# Patient Record
Sex: Male | Born: 1955 | Race: White | Hispanic: No | Marital: Single | Smoking: Former smoker
Health system: Southern US, Community
[De-identification: ages and names within clinical notes are randomized; demographics above are authoritative.]

## PROBLEM LIST (undated history)

## (undated) DIAGNOSIS — K760 Fatty (change of) liver, not elsewhere classified: Secondary | ICD-10-CM

## (undated) DIAGNOSIS — R519 Headache, unspecified: Secondary | ICD-10-CM

## (undated) DIAGNOSIS — I499 Cardiac arrhythmia, unspecified: Secondary | ICD-10-CM

## (undated) DIAGNOSIS — K219 Gastro-esophageal reflux disease without esophagitis: Secondary | ICD-10-CM

## (undated) DIAGNOSIS — J449 Chronic obstructive pulmonary disease, unspecified: Secondary | ICD-10-CM

## (undated) DIAGNOSIS — K429 Umbilical hernia without obstruction or gangrene: Secondary | ICD-10-CM

## (undated) DIAGNOSIS — E785 Hyperlipidemia, unspecified: Secondary | ICD-10-CM

## (undated) DIAGNOSIS — F419 Anxiety disorder, unspecified: Secondary | ICD-10-CM

## (undated) DIAGNOSIS — D649 Anemia, unspecified: Secondary | ICD-10-CM

## (undated) DIAGNOSIS — Z87891 Personal history of nicotine dependence: Secondary | ICD-10-CM

## (undated) DIAGNOSIS — R892 Abnormal level of other drugs, medicaments and biological substances in specimens from other organs, systems and tissues: Secondary | ICD-10-CM

## (undated) DIAGNOSIS — J302 Other seasonal allergic rhinitis: Secondary | ICD-10-CM

## (undated) DIAGNOSIS — I209 Angina pectoris, unspecified: Secondary | ICD-10-CM

## (undated) DIAGNOSIS — F1210 Cannabis abuse, uncomplicated: Secondary | ICD-10-CM

## (undated) DIAGNOSIS — R51 Headache: Secondary | ICD-10-CM

## (undated) DIAGNOSIS — I700 Atherosclerosis of aorta: Secondary | ICD-10-CM

## (undated) DIAGNOSIS — Z8679 Personal history of other diseases of the circulatory system: Secondary | ICD-10-CM

## (undated) DIAGNOSIS — M1990 Unspecified osteoarthritis, unspecified site: Secondary | ICD-10-CM

## (undated) DIAGNOSIS — F1290 Cannabis use, unspecified, uncomplicated: Secondary | ICD-10-CM

## (undated) DIAGNOSIS — I420 Dilated cardiomyopathy: Secondary | ICD-10-CM

## (undated) DIAGNOSIS — E1165 Type 2 diabetes mellitus with hyperglycemia: Secondary | ICD-10-CM

## (undated) DIAGNOSIS — Z7901 Long term (current) use of anticoagulants: Secondary | ICD-10-CM

## (undated) DIAGNOSIS — I10 Essential (primary) hypertension: Secondary | ICD-10-CM

## (undated) DIAGNOSIS — I2510 Atherosclerotic heart disease of native coronary artery without angina pectoris: Secondary | ICD-10-CM

## (undated) DIAGNOSIS — IMO0002 Reserved for concepts with insufficient information to code with codable children: Secondary | ICD-10-CM

## (undated) DIAGNOSIS — I5022 Chronic systolic (congestive) heart failure: Secondary | ICD-10-CM

## (undated) DIAGNOSIS — I428 Other cardiomyopathies: Secondary | ICD-10-CM

## (undated) DIAGNOSIS — G4733 Obstructive sleep apnea (adult) (pediatric): Secondary | ICD-10-CM

## (undated) DIAGNOSIS — E669 Obesity, unspecified: Secondary | ICD-10-CM

## (undated) DIAGNOSIS — G43909 Migraine, unspecified, not intractable, without status migrainosus: Secondary | ICD-10-CM

## (undated) DIAGNOSIS — G47419 Narcolepsy without cataplexy: Secondary | ICD-10-CM

## (undated) HISTORY — DX: Obstructive sleep apnea (adult) (pediatric): G47.33

## (undated) HISTORY — DX: Anxiety disorder, unspecified: F41.9

## (undated) HISTORY — DX: Cardiac arrhythmia, unspecified: I49.9

## (undated) HISTORY — DX: Essential (primary) hypertension: I10

## (undated) HISTORY — DX: Abnormal level of other drugs, medicaments and biological substances in specimens from other organs, systems and tissues: R89.2

## (undated) HISTORY — DX: Obesity, unspecified: E66.9

## (undated) HISTORY — DX: Dilated cardiomyopathy: I42.0

## (undated) HISTORY — DX: Type 2 diabetes mellitus with hyperglycemia: E11.65

## (undated) HISTORY — DX: Unspecified osteoarthritis, unspecified site: M19.90

## (undated) HISTORY — DX: Chronic systolic (congestive) heart failure: I50.22

## (undated) HISTORY — DX: Other cardiomyopathies: I42.8

## (undated) HISTORY — DX: Headache: R51

## (undated) HISTORY — DX: Personal history of other diseases of the circulatory system: Z86.79

## (undated) HISTORY — DX: Other seasonal allergic rhinitis: J30.2

## (undated) HISTORY — DX: Anemia, unspecified: D64.9

## (undated) HISTORY — DX: Hyperlipidemia, unspecified: E78.5

## (undated) HISTORY — DX: Cannabis abuse, uncomplicated: F12.10

## (undated) HISTORY — DX: Headache, unspecified: R51.9

## (undated) HISTORY — DX: Reserved for concepts with insufficient information to code with codable children: IMO0002

## (undated) HISTORY — DX: Migraine, unspecified, not intractable, without status migrainosus: G43.909

## (undated) HISTORY — DX: Chronic obstructive pulmonary disease, unspecified: J44.9

## (undated) HISTORY — DX: Narcolepsy without cataplexy: G47.419

## (undated) HISTORY — DX: Atherosclerotic heart disease of native coronary artery without angina pectoris: I25.10

## (undated) HISTORY — DX: Personal history of nicotine dependence: Z87.891

## (undated) MED FILL — Ferumoxytol Inj 510 MG/17ML (30 MG/ML) (Elemental Fe): INTRAVENOUS | Qty: 17 | Status: AC

---

## 2008-03-12 ENCOUNTER — Emergency Department (HOSPITAL_COMMUNITY): Admission: EM | Admit: 2008-03-12 | Discharge: 2008-03-12 | Payer: Self-pay | Admitting: Family Medicine

## 2009-06-24 ENCOUNTER — Encounter: Payer: Self-pay | Admitting: *Deleted

## 2009-06-24 ENCOUNTER — Ambulatory Visit: Payer: Self-pay | Admitting: Family Medicine

## 2009-06-24 DIAGNOSIS — J309 Allergic rhinitis, unspecified: Secondary | ICD-10-CM | POA: Insufficient documentation

## 2009-06-24 DIAGNOSIS — R0602 Shortness of breath: Secondary | ICD-10-CM | POA: Insufficient documentation

## 2009-06-25 ENCOUNTER — Ambulatory Visit: Payer: Self-pay | Admitting: Family Medicine

## 2009-06-25 ENCOUNTER — Encounter: Payer: Self-pay | Admitting: Family Medicine

## 2009-06-25 LAB — CONVERTED CEMR LAB
ALT: 17 units/L (ref 0–53)
AST: 14 units/L (ref 0–37)
Albumin: 3.9 g/dL (ref 3.5–5.2)
BUN: 10 mg/dL (ref 6–23)
CO2: 32 meq/L (ref 19–32)
Calcium: 9 mg/dL (ref 8.4–10.5)
Chloride: 100 meq/L (ref 96–112)
Cholesterol: 174 mg/dL (ref 0–200)
Creatinine, Ser: 0.65 mg/dL (ref 0.40–1.50)
HDL: 41 mg/dL (ref >39)
Potassium: 4.8 meq/L (ref 3.5–5.3)
Pro B Natriuretic peptide (BNP): 4.8 pg/mL (ref 0.0–100.0)
Total CHOL/HDL Ratio: 4.2

## 2009-07-17 ENCOUNTER — Ambulatory Visit (HOSPITAL_BASED_OUTPATIENT_CLINIC_OR_DEPARTMENT_OTHER): Admission: RE | Admit: 2009-07-17 | Discharge: 2009-07-17 | Payer: Self-pay | Admitting: Family Medicine

## 2009-07-22 ENCOUNTER — Ambulatory Visit: Payer: Self-pay | Admitting: Internal Medicine

## 2009-08-13 ENCOUNTER — Ambulatory Visit: Payer: Self-pay | Admitting: Family Medicine

## 2009-08-13 DIAGNOSIS — G4733 Obstructive sleep apnea (adult) (pediatric): Secondary | ICD-10-CM | POA: Insufficient documentation

## 2009-08-13 DIAGNOSIS — G4731 Primary central sleep apnea: Secondary | ICD-10-CM | POA: Insufficient documentation

## 2009-08-13 DIAGNOSIS — M5030 Other cervical disc degeneration, unspecified cervical region: Secondary | ICD-10-CM | POA: Insufficient documentation

## 2009-08-13 DIAGNOSIS — Z87891 Personal history of nicotine dependence: Secondary | ICD-10-CM | POA: Insufficient documentation

## 2009-08-22 ENCOUNTER — Encounter: Payer: Self-pay | Admitting: Family Medicine

## 2009-09-08 ENCOUNTER — Telehealth: Payer: Self-pay | Admitting: Family Medicine

## 2009-09-09 ENCOUNTER — Encounter: Payer: Self-pay | Admitting: Family Medicine

## 2010-05-26 NOTE — Progress Notes (Signed)
Summary: phn msg  Phone Note Call from Patient Call back at Home Phone [phone]   Caller: Patient Summary of Call: has jury duty on 5/19 - he has narcolepsy and sleep apnea - he would like a note stating that he falls asleep all the time and needs an excused note from doctor Initial call taken by: De Nurse,  Sep 08, 2009 10:05 AM  Follow-up for Phone Call        Discussed at [facility] w/ pt disease course and treamtent of sleep apnea. Pt states that CPAP settings are still being adjusted-still not tolerating well. Will write letter for excusing jury duty. Pt can pick up at earliest convenience. Doree Albee MD Sep 09, 2009 5:58 PM

## 2010-05-26 NOTE — Letter (Signed)
Summary: Generic Letter  [facility]  [address]   [place], [place] [address]   Phone: [phone]  Fax: [phone]    09/09/2009  [name] [address] [place], [place]  [address]  To whom it may concern, Please excuse [name] from jury duty as pt was recently diagnosed with severe sleep apnea; which can cause severe daytime sleepiness; and still being adusted to the treatment regimen of nighttime continuous positive airway pressure. Please feel free to contact the [facility] if you have any further questions.             Sincerely,   Doree Albee MD  Appended Document: Generic Letter placed in front for pt to pick up
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
## 2010-05-26 NOTE — Miscellaneous (Signed)
Summary: needs change of CPAP pressures  Clinical Lists Changes [facility] sebt a fax stating pt cannot tolerate pressure prescribed of 18 cm H2O. there is a form to be addressed & faxed back. notice to [facility] RN  August 22, 2009 9:54 AM  Faxed information back 08/25/08 Doree Albee MD

## 2010-05-26 NOTE — Miscellaneous (Signed)
Summary: re: Colonoscopy/TS  Clinical Lists Changes pt never had colonoscopy. offered to sched. screening colonoscopy for him. pt wants to discuss this at his next visit. does not want it scheduled now. Arlyss Repress CMA,  June 24, 2009 5:05 PM

## 2010-07-14 ENCOUNTER — Observation Stay: Payer: Self-pay | Admitting: Internal Medicine

## 2010-07-15 ENCOUNTER — Encounter: Payer: Self-pay | Admitting: Internal Medicine

## 2010-07-15 DIAGNOSIS — I2510 Atherosclerotic heart disease of native coronary artery without angina pectoris: Secondary | ICD-10-CM

## 2010-07-15 HISTORY — DX: Atherosclerotic heart disease of native coronary artery without angina pectoris: I25.10

## 2010-08-03 ENCOUNTER — Observation Stay: Payer: Self-pay | Admitting: Internal Medicine

## 2010-09-23 ENCOUNTER — Ambulatory Visit: Payer: Self-pay | Admitting: Internal Medicine

## 2010-10-27 ENCOUNTER — Ambulatory Visit: Payer: Self-pay | Admitting: Internal Medicine

## 2011-01-26 LAB — CBC
HCT: 46.2 % (ref 39.0–52.0)
Hemoglobin: 15.7 g/dL (ref 13.0–17.0)
MCV: 92.6 fL (ref 78.0–100.0)
WBC: 15.9 10*3/uL — ABNORMAL HIGH (ref 4.0–10.5)

## 2011-01-26 LAB — DIFFERENTIAL
Eosinophils Absolute: 0.1 10*3/uL (ref 0.0–0.7)
Eosinophils Relative: 1 % (ref 0–5)
Lymphocytes Relative: 16 % (ref 12–46)
Lymphs Abs: 2.6 10*3/uL (ref 0.7–4.0)
Monocytes Absolute: 1.2 10*3/uL — ABNORMAL HIGH (ref 0.1–1.0)

## 2011-02-12 ENCOUNTER — Emergency Department: Payer: Self-pay | Admitting: Unknown Physician Specialty

## 2011-02-18 ENCOUNTER — Encounter: Payer: Self-pay | Admitting: Internal Medicine

## 2011-03-23 ENCOUNTER — Encounter: Payer: Self-pay | Admitting: Internal Medicine

## 2011-04-08 ENCOUNTER — Encounter: Payer: Self-pay | Admitting: Internal Medicine

## 2011-04-08 LAB — PROTIME-INR

## 2011-04-27 DIAGNOSIS — I5022 Chronic systolic (congestive) heart failure: Secondary | ICD-10-CM

## 2011-04-27 DIAGNOSIS — I428 Other cardiomyopathies: Secondary | ICD-10-CM

## 2011-04-27 DIAGNOSIS — I4891 Unspecified atrial fibrillation: Secondary | ICD-10-CM

## 2011-04-27 DIAGNOSIS — Z8679 Personal history of other diseases of the circulatory system: Secondary | ICD-10-CM

## 2011-04-27 DIAGNOSIS — I420 Dilated cardiomyopathy: Secondary | ICD-10-CM

## 2011-04-27 DIAGNOSIS — I509 Heart failure, unspecified: Secondary | ICD-10-CM

## 2011-04-27 HISTORY — DX: Dilated cardiomyopathy: I42.0

## 2011-04-27 HISTORY — DX: Personal history of other diseases of the circulatory system: Z86.79

## 2011-04-27 HISTORY — PX: CARDIAC ELECTROPHYSIOLOGY MAPPING AND ABLATION: SHX1292

## 2011-04-27 HISTORY — DX: Unspecified atrial fibrillation: I48.91

## 2011-04-27 HISTORY — DX: Chronic systolic (congestive) heart failure: I50.22

## 2011-04-27 HISTORY — DX: Heart failure, unspecified: I50.9

## 2011-04-27 HISTORY — DX: Other cardiomyopathies: I42.8

## 2011-04-29 ENCOUNTER — Encounter: Payer: Self-pay | Admitting: Internal Medicine

## 2011-04-29 LAB — PROTIME-INR

## 2011-05-10 ENCOUNTER — Encounter: Payer: Self-pay | Admitting: Internal Medicine

## 2011-05-10 LAB — PROTIME-INR

## 2011-05-19 ENCOUNTER — Encounter: Payer: Self-pay | Admitting: Internal Medicine

## 2011-07-07 ENCOUNTER — Encounter: Payer: Self-pay | Admitting: Internal Medicine

## 2011-07-07 ENCOUNTER — Ambulatory Visit (INDEPENDENT_AMBULATORY_CARE_PROVIDER_SITE_OTHER): Payer: Medicare Other | Admitting: Internal Medicine

## 2011-07-07 ENCOUNTER — Encounter: Payer: Self-pay | Admitting: *Deleted

## 2011-07-07 VITALS — BP 140/72 | HR 80 | Resp 18 | Ht 72.0 in | Wt 308.9 lb

## 2011-07-07 DIAGNOSIS — F17200 Nicotine dependence, unspecified, uncomplicated: Secondary | ICD-10-CM

## 2011-07-07 DIAGNOSIS — Z8679 Personal history of other diseases of the circulatory system: Secondary | ICD-10-CM | POA: Insufficient documentation

## 2011-07-07 DIAGNOSIS — I4892 Unspecified atrial flutter: Secondary | ICD-10-CM

## 2011-07-07 DIAGNOSIS — G4733 Obstructive sleep apnea (adult) (pediatric): Secondary | ICD-10-CM

## 2011-07-07 DIAGNOSIS — I4891 Unspecified atrial fibrillation: Secondary | ICD-10-CM | POA: Insufficient documentation

## 2011-07-07 DIAGNOSIS — E669 Obesity, unspecified: Secondary | ICD-10-CM

## 2011-07-07 DIAGNOSIS — I519 Heart disease, unspecified: Secondary | ICD-10-CM

## 2011-07-07 NOTE — Assessment & Plan Note (Signed)
Cessation advised. 

## 2011-07-07 NOTE — Assessment & Plan Note (Signed)
Weight loss is strongly advised 

## 2011-07-07 NOTE — Assessment & Plan Note (Signed)
The patient has typical appearing symptomatic recurrent atrial flutter.  Though he carries a diagnosis of atrial fibrillation, this is not well documented.  I have spoke with Dr Gwen Pounds who states that the majority of the patient's arrhythmia appears to be atrial flutter. I have also reviewed Dr Maisie Fus' note which states that all ekgs/ monitors that he reviewed reveals atrial flutter.  Therapeutic strategies for atrial flutter including medicine and ablation were discussed in detail with the patient today. Risk, benefits, and alternatives to EP study and radiofrequency ablation  were also discussed in detail today. These risks include but are not limited to stroke, bleeding, vascular damage, tamponade, perforation, damage to the heart and other structures, AV block requiring PPM, worsening renal function, and death. The patient understands these risk and wishes to proceed.  Given that his major arrhythmia burden appears to be from atrial flutter, I would recommend catheter ablation for atrial flutter and then surveillance for afib in the future.  If he develops afib, then AAD therapy would be recommended at that time.  We will therefore proceed with catheter ablation at the next available time.  He will need to have weekly INRs on coumadin in the interim. I will have my research team discuss the CTI-Block study with him also, which is a study to evaluate the [facility] irrigated ablation catheter.  No changes are made today

## 2011-07-07 NOTE — Patient Instructions (Signed)

## 2011-07-07 NOTE — Assessment & Plan Note (Signed)
The importance of lifestyle modification including compliance with CPAP was stressed today.

## 2011-07-07 NOTE — Assessment & Plan Note (Signed)
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

## 2011-07-07 NOTE — Progress Notes (Signed)
Patient ID: [name], male   DOB: 1955-11-07, 56 y.o.   MRN: [id number]  PCP:  Dr Burnadette Pop Referring Physician:  Dr Brooks [name] is a 56 y.o. male with a h/o obesity, OSA, and typical appearing atrial flutter who presents today for EP consultation.  He reports being diagnosed with atrial flutter in early 2012.  He has been placed on metoprolol and diltiazem for rate control but does not recall previously taking an AAD.  He has been cardioverted twice at [facility] 2012 but has returned to atrial flutter both times.  He reports that in atrial flutter, he has decreased exercise tolerance, SOB, and fatigue.  With cardioversion, he had immediate improvement in these symptoms.   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  He was evaluated by Dr Gerre Pebbles from [facility] and was planned to have an atrial flutter ablation.  He states that due to difficulty with insurance, the procedure was not performed and he is therefore referred to our office for the procedure.  He has also been diagnosed with sleep apnea.  He has been only moderately compliant with CPAP.   Today, he denies symptoms of palpitations, chest pain, orthopnea, PND, lower extremity edema, dizziness, presyncope, syncope, or neurologic sequela. The patient is tolerating medications without difficulties and is otherwise without complaint today.   Past Medical History  Diagnosis Date  . Atrial flutter     typical appearing  . Atrial fibrillation   . Obesity   . Hypertension   . Diabetes mellitus   . Hyperlipidemia   . Obstructive sleep apnea     moderately compliant with CPAP  . Anxiety   . Nonischemic cardiomyopathy     EF 25% per DR Gwen Pounds  . Chronic systolic dysfunction of left ventricle     NYHA Class II/III   No past surgical history on file.  Current Outpatient Prescriptions  Medication Sig Dispense Refill  . buPROPion (WELLBUTRIN SR) 150 MG 12 hr tablet Take 1  pill daily for 3 days, then take 1 pill two times a day thereafter.       . diltiazem (CARDIZEM CD) 240 MG 24 hr capsule Take 240 mg by mouth daily.      . furosemide (LASIX) 20 MG tablet Take 20 mg by mouth 2 (two) times daily.      Marland Kitchen LORazepam (ATIVAN) 0.5 MG tablet Take 0.5 mg by mouth every 8 (eight) hours.      . metoprolol succinate (TOPROL-XL) 25 MG 24 hr tablet Take 25 mg by mouth 3 (three) times daily.       . pantoprazole (PROTONIX) 40 MG tablet Take 40 mg by mouth daily.        . potassium chloride (K-DUR,KLOR-CON) 10 MEQ tablet Take 10 mEq by mouth 2 (two) times daily.      . simvastatin (ZOCOR) 20 MG tablet Take 20 mg by mouth every evening.      . warfarin (COUMADIN) 4 MG tablet Take 4 mg by mouth daily.        No Known Allergies  History   Social History  . Marital Status: Single    Spouse Name: N/A    Number of Children: 0  . Years of Education: N/A   Occupational History  . disable     Social History Main Topics  . Smoking status: Current Everyday Smoker    Last Attempt to Quit: 03/22/2011  .  Smokeless tobacco: Not on file   Comment: advised to quit  . Alcohol Use: No  . Drug Use: Yes     occasional marijuana  . Sexually Active: Not on file   Other Topics Concern  . Not on file   Social History Narrative   Pt lives in [place] due to anxiety and sleep apnea    Family History  Problem Relation Age of Onset  . Hypertension Other     fm hx.  . Diabetes Other     fm hx.    ROS- All systems are reviewed and negative except as per the HPI above  Physical Exam: Filed Vitals:   07/07/11 1101  BP: 140/72  Pulse: 80  Resp: 18  Height: 6' (1.829 m)  Weight: 140.116 kg (308 lb 14.4 oz)    GEN- The patient is overweight and disheveled appearing, alert and oriented x 3 today.   Head- normocephalic, atraumatic Eyes-  Sclera clear, conjunctiva pink Ears- hearing intact Oropharynx- clear Neck- supple, no JVP Lymph- no cervical  lymphadenopathy Lungs- Clear to ausculation bilaterally, normal work of breathing Heart- irregular rate and rhythm, no murmurs, rubs or gallops, PMI not laterally displaced GI- soft, NT, ND, + BS Extremities- no clubbing, cyanosis, 1+ chronic edema MS- no significant deformity or atrophy Skin- no rash or lesion Psych- euthymic mood, full affect Neuro- strength and sensation are intact  EKG today reveals typical appearing atrial flutter,  V rate 110 bpm  Assessment and Plan:

## 2011-07-08 ENCOUNTER — Encounter: Payer: Self-pay | Admitting: Internal Medicine

## 2011-07-08 ENCOUNTER — Telehealth: Payer: Self-pay | Admitting: Internal Medicine

## 2011-07-08 NOTE — Telephone Encounter (Signed)
Spoke with Marcelino Duster at Dr Philemon Kingdom office.  Patient came for INR today and it was 1.9.  Patients last check at that office was in January.  Per Marcelino Duster patient is not always the most compliant with his INR's.  She said they will continue to check and fax over today's results. Will forward to [employer] with Dr Johney Frame

## 2011-07-08 NOTE — Telephone Encounter (Signed)
New problem:  Per Marcelino Duster,   1.Who is following patient Protime.   2. Status of Ablation procedure who will be doing it.

## 2011-07-12 NOTE — Telephone Encounter (Signed)
Spoke with [name] and the patient is coming back on Friday to have another INR  I told her he would need to be above 2.0 for the next 3 weeks in order to have the ablation done on 08/03/2011

## 2011-07-14 ENCOUNTER — Institutional Professional Consult (permissible substitution): Payer: Self-pay | Admitting: Internal Medicine

## 2011-07-19 ENCOUNTER — Ambulatory Visit: Payer: Self-pay | Admitting: Family Medicine

## 2011-07-20 ENCOUNTER — Encounter (HOSPITAL_COMMUNITY): Payer: Self-pay | Admitting: Pharmacy Technician

## 2011-07-21 ENCOUNTER — Other Ambulatory Visit: Payer: Self-pay | Admitting: *Deleted

## 2011-07-22 ENCOUNTER — Encounter: Payer: Self-pay | Admitting: Internal Medicine

## 2011-07-22 LAB — PROTIME-INR

## 2011-07-26 ENCOUNTER — Ambulatory Visit: Payer: Self-pay | Admitting: Family Medicine

## 2011-07-28 ENCOUNTER — Encounter: Payer: Self-pay | Admitting: Internal Medicine

## 2011-07-28 LAB — PROTIME-INR

## 2011-08-03 ENCOUNTER — Ambulatory Visit (HOSPITAL_COMMUNITY)
Admission: RE | Admit: 2011-08-03 | Discharge: 2011-08-03 | Disposition: A | Discharge status: Home / Self Care | Payer: Medicare Other | Source: Ambulatory Visit | Attending: Internal Medicine | Admitting: Internal Medicine

## 2011-08-03 ENCOUNTER — Encounter (HOSPITAL_COMMUNITY)
Admission: RE | Disposition: A | Discharge status: Home / Self Care | Payer: Self-pay | Source: Ambulatory Visit | Attending: Internal Medicine

## 2011-08-03 ENCOUNTER — Encounter (HOSPITAL_COMMUNITY): Payer: Self-pay | Admitting: Anesthesiology

## 2011-08-03 DIAGNOSIS — I10 Essential (primary) hypertension: Secondary | ICD-10-CM | POA: Insufficient documentation

## 2011-08-03 DIAGNOSIS — E119 Type 2 diabetes mellitus without complications: Secondary | ICD-10-CM | POA: Insufficient documentation

## 2011-08-03 DIAGNOSIS — F411 Generalized anxiety disorder: Secondary | ICD-10-CM | POA: Insufficient documentation

## 2011-08-03 DIAGNOSIS — F17200 Nicotine dependence, unspecified, uncomplicated: Secondary | ICD-10-CM | POA: Insufficient documentation

## 2011-08-03 DIAGNOSIS — K219 Gastro-esophageal reflux disease without esophagitis: Secondary | ICD-10-CM | POA: Insufficient documentation

## 2011-08-03 DIAGNOSIS — R791 Abnormal coagulation profile: Secondary | ICD-10-CM | POA: Insufficient documentation

## 2011-08-03 DIAGNOSIS — I4892 Unspecified atrial flutter: Secondary | ICD-10-CM | POA: Insufficient documentation

## 2011-08-03 DIAGNOSIS — R0602 Shortness of breath: Secondary | ICD-10-CM | POA: Insufficient documentation

## 2011-08-03 DIAGNOSIS — Z5309 Procedure and treatment not carried out because of other contraindication: Secondary | ICD-10-CM | POA: Insufficient documentation

## 2011-08-03 DIAGNOSIS — G4730 Sleep apnea, unspecified: Secondary | ICD-10-CM | POA: Insufficient documentation

## 2011-08-03 LAB — APTT: aPTT: 32 seconds (ref 24–37)

## 2011-08-03 LAB — CBC
MCV: 90.9 fL (ref 78.0–100.0)
Platelets: 195 10*3/uL (ref 150–400)
RBC: 4.92 MIL/uL (ref 4.22–5.81)
WBC: 11 10*3/uL — ABNORMAL HIGH (ref 4.0–10.5)

## 2011-08-03 LAB — BASIC METABOLIC PANEL
CO2: 29 mEq/L (ref 19–32)
Chloride: 97 mEq/L (ref 96–112)
Creatinine, Ser: 0.56 mg/dL (ref 0.50–1.35)
GFR calc Af Amer: >90 mL/min (ref >90)
Potassium: 4.2 mEq/L (ref 3.5–5.1)

## 2011-08-03 LAB — PROTIME-INR
INR: 1.48 (ref 0.00–1.49)
Prothrombin Time: 18.2 seconds — ABNORMAL HIGH (ref 11.6–15.2)

## 2011-08-03 LAB — GLUCOSE, CAPILLARY: Glucose-Capillary: 152 mg/dL — ABNORMAL HIGH (ref 70–99)

## 2011-08-03 SURGERY — ATRIAL FLUTTER ABLATION
Anesthesia: Monitor Anesthesia Care

## 2011-08-03 NOTE — Addendum Note (Signed)
Addendum  created 08/03/11 0957 by Mancel Parsons, CRNA   Modules edited:Anesthesia Events

## 2011-08-03 NOTE — Anesthesia Postprocedure Evaluation (Signed)
Case Cancelled

## 2011-08-03 NOTE — Anesthesia Preprocedure Evaluation (Addendum)
Anesthesia Evaluation  Patient identified by MRN, date of birth, ID band Patient awake    Reviewed: Allergy & Precautions, H&P , NPO status , Patient's Chart, lab work & pertinent test results, reviewed documented beta blocker date and time   Airway Mallampati: III      Dental   Pulmonary shortness of breath, sleep apnea , Current Smoker,          Cardiovascular hypertension, Pt. on medications and Pt. on home beta blockers + dysrhythmias Atrial Fibrillation     Neuro/Psych Anxiety    GI/Hepatic GERD-  ,  Endo/Other  Diabetes mellitus-, Poorly Controlled, Type 2, Oral Hypoglycemic Agents  Renal/GU      Musculoskeletal   Abdominal   Peds  Hematology   Anesthesia Other Findings   Reproductive/Obstetrics                          Anesthesia Physical Anesthesia Plan Anesthesia Quick Evaluation

## 2011-08-03 NOTE — Transfer of Care (Signed)
Case Cancelled

## 2011-08-03 NOTE — Progress Notes (Signed)
Procedure cancelled. Pt d/c'd home with family

## 2011-08-03 NOTE — H&P (Signed)
  The patient presents for elective ablation of typical appearing atrial flutter.  Though INRs had been consistently therapeutic, he unfortunately held his coumadin over the past 48 hours.  His INR today is 1.4. He is clear that he would not like to proceed with a TEE guided procedure.  He would like to return after his INRs have been therapeutic for an additional 4 weeks. I will therefore send the patient home and have my office contact him to arrange for the repeat procedure.

## 2011-08-10 ENCOUNTER — Encounter: Payer: Self-pay | Admitting: Internal Medicine

## 2011-08-10 LAB — PROTIME-INR

## 2011-08-17 ENCOUNTER — Encounter: Payer: Self-pay | Admitting: Internal Medicine

## 2011-08-24 ENCOUNTER — Encounter: Payer: Self-pay | Admitting: Internal Medicine

## 2011-08-24 LAB — PROTIME-INR

## 2011-08-27 ENCOUNTER — Emergency Department: Payer: Self-pay | Admitting: Internal Medicine

## 2011-08-27 LAB — COMPREHENSIVE METABOLIC PANEL
Bilirubin,Total: 0.4 mg/dL (ref 0.2–1.0)
Chloride: 101 mmol/L (ref 98–107)
Co2: 28 mmol/L (ref 21–32)
Creatinine: 0.75 mg/dL (ref 0.60–1.30)
EGFR (Non-African Amer.): >60
Glucose: 161 mg/dL — ABNORMAL HIGH (ref 65–99)
Osmolality: 278 (ref 275–301)
Potassium: 3.9 mmol/L (ref 3.5–5.1)
SGPT (ALT): 31 U/L
Total Protein: 7.3 g/dL (ref 6.4–8.2)

## 2011-08-27 LAB — PROTIME-INR
INR: 1.8
Prothrombin Time: 21.5 secs — ABNORMAL HIGH (ref 11.5–14.7)

## 2011-08-27 LAB — CBC
HCT: 47.8 % (ref 40.0–52.0)
Platelet: 189 10*3/uL (ref 150–440)
RBC: 5.19 10*6/uL (ref 4.40–5.90)
WBC: 10.2 10*3/uL (ref 3.8–10.6)

## 2011-08-27 LAB — CK TOTAL AND CKMB (NOT AT ARMC): CK, Total: 61 U/L (ref 35–232)

## 2011-08-27 LAB — TROPONIN I: Troponin-I: <0.02 ng/mL

## 2011-09-01 ENCOUNTER — Encounter: Payer: Self-pay | Admitting: Internal Medicine

## 2011-09-01 LAB — PROTIME-INR

## 2011-09-03 ENCOUNTER — Other Ambulatory Visit: Payer: Self-pay | Admitting: *Deleted

## 2011-09-03 ENCOUNTER — Encounter: Payer: Self-pay | Admitting: *Deleted

## 2011-09-03 ENCOUNTER — Encounter: Payer: Self-pay | Admitting: Internal Medicine

## 2011-09-07 ENCOUNTER — Encounter: Payer: Self-pay | Admitting: Internal Medicine

## 2011-09-07 ENCOUNTER — Encounter (HOSPITAL_COMMUNITY): Payer: Self-pay | Admitting: Respiratory Therapy

## 2011-09-13 ENCOUNTER — Encounter: Payer: Self-pay | Admitting: Internal Medicine

## 2011-09-21 ENCOUNTER — Encounter (HOSPITAL_COMMUNITY)
Admission: RE | Disposition: A | Discharge status: Home / Self Care | Payer: Self-pay | Source: Ambulatory Visit | Attending: Internal Medicine

## 2011-09-21 ENCOUNTER — Ambulatory Visit (HOSPITAL_COMMUNITY): Payer: Medicare Other | Admitting: Anesthesiology

## 2011-09-21 ENCOUNTER — Encounter (HOSPITAL_COMMUNITY): Payer: Self-pay | Admitting: Anesthesiology

## 2011-09-21 ENCOUNTER — Ambulatory Visit (HOSPITAL_COMMUNITY)
Admission: RE | Admit: 2011-09-21 | Discharge: 2011-09-22 | Disposition: A | Discharge status: Home / Self Care | Payer: Medicare Other | Source: Ambulatory Visit | Attending: Internal Medicine | Admitting: Internal Medicine

## 2011-09-21 ENCOUNTER — Ambulatory Visit (HOSPITAL_COMMUNITY): Payer: Medicare Other

## 2011-09-21 ENCOUNTER — Encounter (HOSPITAL_COMMUNITY): Payer: Self-pay | Admitting: General Practice

## 2011-09-21 DIAGNOSIS — I10 Essential (primary) hypertension: Secondary | ICD-10-CM | POA: Insufficient documentation

## 2011-09-21 DIAGNOSIS — E669 Obesity, unspecified: Secondary | ICD-10-CM | POA: Insufficient documentation

## 2011-09-21 DIAGNOSIS — I4892 Unspecified atrial flutter: Secondary | ICD-10-CM | POA: Insufficient documentation

## 2011-09-21 DIAGNOSIS — I5022 Chronic systolic (congestive) heart failure: Secondary | ICD-10-CM | POA: Insufficient documentation

## 2011-09-21 DIAGNOSIS — I428 Other cardiomyopathies: Secondary | ICD-10-CM | POA: Insufficient documentation

## 2011-09-21 DIAGNOSIS — E119 Type 2 diabetes mellitus without complications: Secondary | ICD-10-CM | POA: Insufficient documentation

## 2011-09-21 DIAGNOSIS — G4733 Obstructive sleep apnea (adult) (pediatric): Secondary | ICD-10-CM | POA: Insufficient documentation

## 2011-09-21 DIAGNOSIS — G4731 Primary central sleep apnea: Secondary | ICD-10-CM | POA: Insufficient documentation

## 2011-09-21 DIAGNOSIS — Z8679 Personal history of other diseases of the circulatory system: Secondary | ICD-10-CM | POA: Insufficient documentation

## 2011-09-21 DIAGNOSIS — E785 Hyperlipidemia, unspecified: Secondary | ICD-10-CM | POA: Insufficient documentation

## 2011-09-21 DIAGNOSIS — I4891 Unspecified atrial fibrillation: Secondary | ICD-10-CM | POA: Insufficient documentation

## 2011-09-21 HISTORY — PX: ATRIAL FLUTTER ABLATION: SHX5733

## 2011-09-21 HISTORY — DX: Angina pectoris, unspecified: I20.9

## 2011-09-21 LAB — GLUCOSE, CAPILLARY
Glucose-Capillary: 133 mg/dL — ABNORMAL HIGH (ref 70–99)
Glucose-Capillary: 122 mg/dL — ABNORMAL HIGH (ref 70–99)
Glucose-Capillary: 183 mg/dL — ABNORMAL HIGH (ref 70–99)

## 2011-09-21 SURGERY — ATRIAL FLUTTER ABLATION
Anesthesia: Monitor Anesthesia Care

## 2011-09-21 MED ORDER — NEOSTIGMINE METHYLSULFATE 1 MG/ML IJ SOLN
INTRAMUSCULAR | Status: DC | PRN
  Administered 2011-09-21: 4 mg via INTRAVENOUS

## 2011-09-21 MED ORDER — SIMVASTATIN 20 MG PO TABS
20.0000 mg | ORAL_TABLET | Freq: Every day | ORAL | Status: DC
  Administered 2011-09-21: 20 mg via ORAL
  Filled 2011-09-21 (×2): qty 1

## 2011-09-21 MED ORDER — LIDOCAINE-EPINEPHRINE 1 %-1:100000 IJ SOLN
INTRAMUSCULAR | Status: AC
  Filled 2011-09-21: qty 1

## 2011-09-21 MED ORDER — WARFARIN SODIUM 4 MG PO TABS
4.0000 mg | ORAL_TABLET | Freq: Every day | ORAL | Status: DC
  Administered 2011-09-21: 4 mg via ORAL
  Filled 2011-09-21 (×2): qty 1

## 2011-09-21 MED ORDER — ROCURONIUM BROMIDE 100 MG/10ML IV SOLN
INTRAVENOUS | Status: DC | PRN
  Administered 2011-09-21: 50 mg via INTRAVENOUS

## 2011-09-21 MED ORDER — ONDANSETRON HCL 4 MG/2ML IJ SOLN: 4.0000 mg | Freq: Four times a day (QID) | INTRAMUSCULAR | Status: DC | PRN

## 2011-09-21 MED ORDER — GLYCOPYRROLATE 0.2 MG/ML IJ SOLN
INTRAMUSCULAR | Status: DC | PRN
  Administered 2011-09-21: 0.6 mg via INTRAVENOUS

## 2011-09-21 MED ORDER — LORAZEPAM 0.5 MG PO TABS: 0.5000 mg | ORAL_TABLET | Freq: Every evening | ORAL | Status: DC | PRN

## 2011-09-21 MED ORDER — FUROSEMIDE 20 MG PO TABS
20.0000 mg | ORAL_TABLET | Freq: Three times a day (TID) | ORAL | Status: DC
  Administered 2011-09-21 – 2011-09-22 (×3): 20 mg via ORAL
  Filled 2011-09-21 (×5): qty 1

## 2011-09-21 MED ORDER — BUPIVACAINE HCL (PF) 0.25 % IJ SOLN
INTRAMUSCULAR | Status: AC
  Filled 2011-09-21: qty 60

## 2011-09-21 MED ORDER — NALOXONE HCL 0.4 MG/ML IJ SOLN
INTRAMUSCULAR | Status: DC | PRN
  Administered 2011-09-21 (×2): 0.2 mg via INTRAVENOUS

## 2011-09-21 MED ORDER — ONDANSETRON HCL 4 MG/2ML IJ SOLN
INTRAMUSCULAR | Status: DC | PRN
  Administered 2011-09-21: 4 mg via INTRAVENOUS

## 2011-09-21 MED ORDER — ACETAMINOPHEN 325 MG PO TABS: 650.0000 mg | ORAL_TABLET | ORAL | Status: DC | PRN

## 2011-09-21 MED ORDER — GLIMEPIRIDE 2 MG PO TABS
2.0000 mg | ORAL_TABLET | Freq: Every day | ORAL | Status: DC
  Administered 2011-09-22: 2 mg via ORAL
  Filled 2011-09-21: qty 1

## 2011-09-21 MED ORDER — LACTATED RINGERS IV SOLN: INTRAVENOUS | Status: DC | PRN

## 2011-09-21 MED ORDER — WARFARIN SODIUM 4 MG PO TABS: 4.0000 mg | ORAL_TABLET | Freq: Every day | ORAL | Status: DC

## 2011-09-21 MED ORDER — LIDOCAINE HCL 1 % IJ SOLN
3.0000 mL | Freq: Once | INTRAMUSCULAR | Status: AC
  Administered 2011-09-21: 3 mL

## 2011-09-21 MED ORDER — SODIUM CHLORIDE 0.9 % IV SOLN: 250.0000 mL | INTRAVENOUS | Status: DC | PRN

## 2011-09-21 MED ORDER — METFORMIN HCL 500 MG PO TABS
1000.0000 mg | ORAL_TABLET | Freq: Two times a day (BID) | ORAL | Status: DC
  Administered 2011-09-21 – 2011-09-22 (×2): 1000 mg via ORAL
  Filled 2011-09-21 (×4): qty 2

## 2011-09-21 MED ORDER — SODIUM CHLORIDE 0.9 % IJ SOLN
3.0000 mL | Freq: Two times a day (BID) | INTRAMUSCULAR | Status: DC
  Administered 2011-09-21: 3 mL via INTRAVENOUS

## 2011-09-21 MED ORDER — WARFARIN - PHYSICIAN DOSING INPATIENT: Freq: Every day | Status: DC

## 2011-09-21 MED ORDER — SODIUM CHLORIDE 0.9 % IJ SOLN: 3.0000 mL | INTRAMUSCULAR | Status: DC | PRN

## 2011-09-21 MED ORDER — HYDROCODONE-ACETAMINOPHEN 5-325 MG PO TABS: 1.0000 | ORAL_TABLET | ORAL | Status: DC | PRN

## 2011-09-21 MED ORDER — PROPOFOL 10 MG/ML IV EMUL
INTRAVENOUS | Status: DC | PRN
  Administered 2011-09-21: 200 mg via INTRAVENOUS

## 2011-09-21 MED ORDER — SODIUM CHLORIDE 0.9 % IV SOLN
INTRAVENOUS | Status: DC | PRN
  Administered 2011-09-21: 08:00:00 via INTRAVENOUS

## 2011-09-21 MED ORDER — LIDOCAINE HCL 1 % IJ SOLN
1.0000 mL | Freq: Once | INTRAMUSCULAR | Status: DC
  Filled 2011-09-21: qty 1

## 2011-09-21 MED ORDER — FENTANYL CITRATE 0.05 MG/ML IJ SOLN
INTRAMUSCULAR | Status: DC | PRN
  Administered 2011-09-21 (×2): 100 ug via INTRAVENOUS
  Administered 2011-09-21: 50 ug via INTRAVENOUS

## 2011-09-21 MED ORDER — POTASSIUM CHLORIDE CRYS ER 10 MEQ PO TBCR
10.0000 meq | EXTENDED_RELEASE_TABLET | Freq: Every day | ORAL | Status: DC
  Administered 2011-09-22: 10 meq via ORAL
  Filled 2011-09-21: qty 1

## 2011-09-21 NOTE — Progress Notes (Signed)
Notified Brooke Edmisten PA that pts BP 89/62, pt BP ran like this during ablation. Asymptomatic. Just sleepy. Continue to monitor. No fluids at this time. EF 20%

## 2011-09-21 NOTE — Anesthesia Postprocedure Evaluation (Signed)
  Anesthesia Post-op Note  Patient: Chrissie Noa Zhen  Procedure(s) Performed: Procedure(s) (LRB): ATRIAL FLUTTER ABLATION (N/A)  Patient Location: Cath Lab  Anesthesia Type: General  Level of Consciousness: awake, alert , oriented and patient cooperative  Airway and Oxygen Therapy: Patient Spontanous Breathing and Patient connected to face mask oxygen  Post-op Pain: none  Post-op Assessment: Post-op Vital signs reviewed, Patient's Cardiovascular Status Stable, Respiratory Function Stable, Patent Airway, No signs of Nausea or vomiting and Pain level controlled  Post-op Vital Signs: stable  Complications: No apparent anesthesia complications

## 2011-09-21 NOTE — Preoperative (Signed)
Beta Blockers   Reason not to administer Beta Blockers:Not Applicable 

## 2011-09-21 NOTE — Progress Notes (Signed)
UR Completed [name] Brosious Graves-Bigelow, RN,BSN [phone]  

## 2011-09-21 NOTE — Consult Note (Signed)
Pt is a 1 ppd smoker who quit about a month ago on his own and has remained tobacco free. He's quit one other time for 10 weeks. Congratulated and encouraged pt to remain tobacco free. Referred to 1-800 quit now for f/u and support. Discussed oral fixation substitutes, second hand smoke and in home smoking policy. Reviewed and gave pt Written education/contact information.

## 2011-09-21 NOTE — Progress Notes (Signed)
Pt continues to state he can't breath, fluid building in chest, choking. Pt's sats 98% on 2L. Refuses to keep bed at 30 degrees. Moved up to 45 degrees. Right groin level 0. No signs of bleeding. [employer] PA by to evaluate. Pt stable. Chest XRAY ordered. Remains very sleepy from previous anesthesia. Pt has sleep apnea and refuses CPAP.

## 2011-09-21 NOTE — Brief Op Note (Signed)
09/21/2011  9:55 AM  PATIENT:  [name]  56 y.o. male  PRE-OPERATIVE DIAGNOSIS:  atrial flutter  POST-OPERATIVE DIAGNOSIS:  Counter clockwise isthmus dependant right atrial flutter  PROCEDURE:  Procedure(s) (LRB): ATRIAL FLUTTER ABLATION (N/A)  SURGEON:  Surgeon(s) and Role:    * Hillis Range, MD - Primary  PHYSICIAN ASSISTANT:   ASSISTANTS: none   ANESTHESIA:   general  EBL:  Total I/O In: 700 [I.V.:700] Out: -   BLOOD ADMINISTERED:none  DRAINS: none   LOCAL MEDICATIONS USED:  LIDOCAINE   SPECIMEN:  No Specimen  DISPOSITION OF SPECIMEN:  N/A  COUNTS:  YES  TOURNIQUET:  * No tourniquets in log *  DICTATION: Note dictated (dictation number [id number])  PLAN OF CARE: Admit for overnight observation  PATIENT DISPOSITION:  PACU - hemodynamically stable.   Delay start of Pharmacological VTE agent (>24hrs) due to surgical blood loss or risk of bleeding: not applicable

## 2011-09-21 NOTE — Op Note (Signed)
NAME:  SRICHARAN, LACOMB NO.:  [id number]  MEDICAL RECORD NO.:  [id number]  LOCATION:  [facility]                         FACILITY:  [facility]  PHYSICIAN:  Hillis Range, MD       DATE OF BIRTH:  Mar 16, 1956  DATE OF PROCEDURE: DATE OF DISCHARGE:                              OPERATIVE REPORT   SURGEON:  Hillis Range, MD  PREPROCEDURE DIAGNOSIS:  Atrial flutter.  POSTPROCEDURE DIAGNOSIS:  Counterclockwise isthmus-dependent right atrial flutter.  PROCEDURES: 1. Comprehensive electrophysiology study. 2. Coronary sinus pacing and recording. 3. Three-dimensional mapping of SVT (atrial flutter). 4. Radiofrequency ablation of SVT (atrial flutter).  INTRODUCTION:  Mr. Fristoe is a 56 year old gentleman with symptomatic typical-appearing atrial flutter.  He has had longstanding atrial flutter for which heart rates have been difficult to control.  He has been evaluated by Dr. Gwen Pounds in [place] and initiated on anticoagulation with Coumadin.  He presents today for EP study and radiofrequency ablation.  PROCEDURE:  Informed written consent was obtained and the patient was brought to the electrophysiology lab in the fasting state.  He was adequately sedated as outlined in the anesthesia report.  The patient's right groin was prepped and draped in the usual sterile fashion by the EP lab staff.  Using a percutaneous Seldinger technique, two 7-French and one 8-French hemostasis sheaths were placed into the right common femoral vein.  A 7-French Biosense Webster decapolar catheter was introduced through the right common femoral vein and advanced into the coronary sinus for recording and pacing from this location.  A 6-French quadripolar Josephson catheter was introduced through the right common femoral vein and advanced into the right ventricle for recording and pacing.  This catheter was then pulled back to the His bundle location. The patient presented to the electrophysiology  lab in atrial flutter. The surface electrocardiogram was consistent with typical-appearing atrial flutter.  His QRS duration was 131 msec with an average RR interval of 540 msec.  His HV interval measured 57 msec.  The coronary sinus activation sequence was proximal to distal suggestive of right atrial flutter.  The atrial flutter cycle length measured 260 msec. Entrainment was performed from the left atrium which revealed a long post-pacing interval.  Entrainment was then performed from the cavotricuspid isthmus which revealed a post-pacing interval equal to the tachycardia cycle length.  The patient was therefore felt to have isthmus-dependent right atrial flutter.  A 7-French Biosense Webster 8- mm ablation catheter was introduced through the right common femoral vein and advanced into the right atrium.  Three-dimensional electroanatomical mapping was performed during tachycardia.  This demonstrated that the entire tachycardia cycle length could be measured from the right atrium.  Activation sequence revealed a counterclockwise annular rotation around the tricuspid valve anulus.  The patient was therefore felt to have counterclockwise isthmus-dependent right atrial flutter.  I therefore elected to perform cavotricuspid isthmus ablation. A series of radiofrequency applications were delivered between the tricuspid valve anulus and the inferior vena cava along the usual cavotricuspid isthmus with a target temperature of 60 degrees at 50 watts.  The tachycardia slowed and then terminated during ablation.  The patient remained in sinus rhythm thereafter.  Additional radiofrequency current was delivered  as bonus burns along the cavotricuspid isthmus. The ablation catheter was then removed and in its place a dual decapolar Halo catheter was advanced into the right atrium.  This catheter was positioned around the tricuspid valve anulus.  Differential atrial pacing was performed from the low  lateral right atrium.  This demonstrated complete bidirectional cavotricuspid isthmus block.  The stimulus earliest atrial activation recorded bidirectional across the isthmus measured 170 msec.  The patient was observed for 20-minute period without return of conduction through the isthmus.  Following ablation, the AH interval measured 101 msec with an HV interval of 56 msec.  Ventricular pacing was performed, which revealed midline decremental concentric VA conduction with a VA Wenckebach cycle length of 370 msec.  No arrhythmias were observed.  Rapid atrial pacing was performed, which revealed no evidence of PR greater than RR.  The AV Wenckebach cycle length was 360 msec and no arrhythmias were observed. The procedure was therefore considered completed.  All catheters were removed and the sheaths were aspirated and flushed.  The sheaths were removed and hemostasis was assured.  There were no early apparent complications.  CONCLUSIONS: 1. Counterclockwise isthmus-dependent right atrial flutter upon     presentation, successfully ablated along the usual cavotricuspid     isthmus. 2. Complete bidirectional cavotricuspid isthmus block achieved. 3. No inducible arrhythmias following ablation. 4. No early apparent complications.     Hillis Range, MD     JA/MEDQ  D:  09/21/2011  T:  09/21/2011  Job:  [id number]  cc:   Arnoldo Hooker, MD

## 2011-09-21 NOTE — Transfer of Care (Signed)
Immediate Anesthesia Transfer of Care Note  Patient: [name]  Procedure(s) Performed: Procedure(s) (LRB): ATRIAL FLUTTER ABLATION (N/A)  Patient Location: PACU  Anesthesia Type: General  Level of Consciousness: awake, alert , oriented and sedated  Airway & Oxygen Therapy: Patient Spontanous Breathing, Patient connected to face mask oxygen and non-rebreather face mask  Post-op Assessment: Report given to PACU RN, Post -op Vital signs reviewed and stable and Patient moving all extremities  Post vital signs: Reviewed and stable  Complications: No apparent anesthesia complications

## 2011-09-21 NOTE — H&P (Signed)
PCP: Dr Burnadette Pop  Referring Physician: Dr Brooks Sailors is a 56 y.o. male with a h/o obesity, OSA, and typical appearing atrial flutter who presents today for EP consultation. He reports being diagnosed with atrial flutter in early 2012. He has been placed on metoprolol and diltiazem for rate control but does not recall previously taking an AAD. He has been cardioverted twice at [facility] 2012 but has returned to atrial flutter both times. He reports that in atrial flutter, he has decreased exercise tolerance, SOB, and fatigue. With cardioversion, he had immediate improvement in these symptoms. 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 He was evaluated by Dr Gerre Pebbles from [facility] and was planned to have an atrial flutter ablation. He states that due to difficulty with insurance, the procedure was not performed and he is therefore referred to our office for the procedure. He has also been diagnosed with sleep apnea. He has been only moderately compliant with CPAP.  Today, he denies symptoms of palpitations, chest pain, orthopnea, PND, lower extremity edema, dizziness, presyncope, syncope, or neurologic sequela. The patient is tolerating medications without difficulties and is otherwise without complaint today.   Past Medical History   Diagnosis  Date   .  Atrial flutter      typical appearing   .  Atrial fibrillation    .  Obesity    .  Hypertension    .  Diabetes mellitus    .  Hyperlipidemia    .  Obstructive sleep apnea      moderately compliant with CPAP   .  Anxiety    .  Nonischemic cardiomyopathy      EF 25% per DR Gwen Pounds   .  Chronic systolic dysfunction of left ventricle      NYHA Class II/III    No past surgical history on file.  Current Outpatient Prescriptions   Medication  Sig  Dispense  Refill   .  buPROPion (WELLBUTRIN SR) 150 MG 12 hr tablet  Take 1 pill daily for 3 days, then take 1 pill two times a day  thereafter.     .  diltiazem (CARDIZEM CD) 240 MG 24 hr capsule  Take 240 mg by mouth daily.     .  furosemide (LASIX) 20 MG tablet  Take 20 mg by mouth 2 (two) times daily.     Marland Kitchen  LORazepam (ATIVAN) 0.5 MG tablet  Take 0.5 mg by mouth every 8 (eight) hours.     .  metoprolol succinate (TOPROL-XL) 25 MG 24 hr tablet  Take 25 mg by mouth 3 (three) times daily.     .  pantoprazole (PROTONIX) 40 MG tablet  Take 40 mg by mouth daily.     .  potassium chloride (K-DUR,KLOR-CON) 10 MEQ tablet  Take 10 mEq by mouth 2 (two) times daily.     .  simvastatin (ZOCOR) 20 MG tablet  Take 20 mg by mouth every evening.     .  warfarin (COUMADIN) 4 MG tablet  Take 4 mg by mouth daily.      No Known Allergies  History    Social History   .  Marital Status:  Single     Spouse Name:  N/A     Number of Children:  0   .  Years of Education:  N/A    Occupational History   .  disable     Social  History Main Topics   .  Smoking status:  Current Everyday Smoker     Last Attempt to Quit:  03/22/2011   .  Smokeless tobacco:  Not on file     Comment: advised to quit    .  Alcohol Use:  No   .  Drug Use:  Yes      occasional marijuana   .  Sexually Active:  Not on file    Other Topics  Concern   .  Not on file    Social History Narrative    Pt lives in [place] due to anxiety and sleep apnea    Family History   Problem  Relation  Age of Onset   .  Hypertension  Other       fm hx.    .  Diabetes  Other       fm hx.    ROS- All systems are reviewed and negative except as per the HPI above  Physical Exam:  Filed Vitals:    07/07/11 1101   BP:  140/72   Pulse:  80   Resp:  18   Height:  6' (1.829 m)   Weight:  140.116 kg (308 lb 14.4 oz)    GEN- The patient is overweight and disheveled appearing, alert and oriented x 3 today.  Head- normocephalic, atraumatic  Eyes- Sclera clear, conjunctiva pink  Ears- hearing intact  Oropharynx- clear  Neck- supple, no JVP  Lymph- no  cervical lymphadenopathy  Lungs- Clear to ausculation bilaterally, normal work of breathing  Heart- irregular rate and rhythm, no murmurs, rubs or gallops, PMI not laterally displaced  GI- soft, NT, ND, + BS  Extremities- no clubbing, cyanosis, 1+ chronic edema  MS- no significant deformity or atrophy  Skin- no rash or lesion  Psych- euthymic mood, full affect  Neuro- strength and sensation are intact  EKG today reveals typical appearing atrial flutter, V rate 110 bpm  Assessment and Plan:   Atrial flutter - Hillis Range, MD  The patient has typical appearing symptomatic recurrent atrial flutter. Though he carries a diagnosis of atrial fibrillation, this is not well documented. I have spoke with Dr Gwen Pounds who states that the majority of the patient's arrhythmia appears to be atrial flutter. Therapeutic strategies for atrial flutter including medicine and ablation were discussed in detail with the patient today. Risk, benefits, and alternatives to EP study and radiofrequency ablation were also discussed in detail today. These risks include but are not limited to stroke, bleeding, vascular damage, tamponade, perforation, damage to the heart and other structures, AV block requiring PPM, worsening renal function, and death. The patient understands these risk and wishes to proceed.  Given that his major arrhythmia burden appears to be from atrial flutter, I would recommend catheter ablation for atrial flutter and then surveillance for afib in the future. If he develops afib, then AAD therapy would be recommended at that time.  We will therefore proceed with catheter ablation today.

## 2011-09-21 NOTE — Anesthesia Preprocedure Evaluation (Addendum)
Anesthesia Evaluation  Patient identified by MRN, date of birth, ID band Patient awake    Reviewed: Allergy & Precautions, H&P , NPO status , Patient's Chart, lab work & pertinent test results  Airway Mallampati: II TM Distance: >3 FB Neck ROM: full    Dental  (+) Poor Dentition and Missing   Pulmonary shortness of breath and with exertion, sleep apnea ,          Cardiovascular hypertension, + dysrhythmias Atrial Fibrillation Rhythm:irregular Rate:Normal     Neuro/Psych Anxiety    GI/Hepatic GERD-  ,  Endo/Other  Diabetes mellitus-, Type 2, Oral Hypoglycemic Agents  Renal/GU      Musculoskeletal   Abdominal   Peds  Hematology   Anesthesia Other Findings   Reproductive/Obstetrics                          Anesthesia Physical Anesthesia Plan  ASA: III  Anesthesia Plan: General and MAC   Post-op Pain Management:    Induction: Intravenous  Airway Management Planned: LMA and Oral ETT  Additional Equipment:   Intra-op Plan:   Post-operative Plan: Extubation in OR  Informed Consent: I have reviewed the patients History and Physical, chart, labs and discussed the procedure including the risks, benefits and alternatives for the proposed anesthesia with the patient or authorized representative who has indicated his/her understanding and acceptance.     Plan Discussed with: CRNA, Anesthesiologist and Surgeon  Anesthesia Plan Comments:         Anesthesia Quick Evaluation

## 2011-09-21 NOTE — Care Management Note (Signed)
    Page 1 of 1   09/21/2011     12:20:24 PM   CARE MANAGEMENT NOTE 09/21/2011  Patient:  [name], [name]   Account Number:  [id number]  Date Initiated:  09/21/2011  Documentation initiated by:  GRAVES-BIGELOW,Arley Garant  Subjective/Objective Assessment:   Pt admitted for ATRIAL FLUTTER ABLATION. Plan for d/c home.     Action/Plan:   No needs form CM at this time.   Anticipated DC Date:  09/22/2011   Anticipated DC Plan:  HOME/SELF CARE      DC Planning Services  CM consult      Choice offered to / List presented to:             Status of service:  Completed, signed off Medicare Important Message given?   (If response is "NO", the following Medicare IM given date fields will be blank) Date Medicare IM given:   Date Additional Medicare IM given:    Discharge Disposition:  HOME/SELF CARE  Per UR Regulation:  Reviewed for med. necessity/level of care/duration of stay  If discussed at Long Length of Stay Meetings, dates discussed:    Comments:

## 2011-09-22 DIAGNOSIS — I4892 Unspecified atrial flutter: Secondary | ICD-10-CM

## 2011-09-22 NOTE — Progress Notes (Signed)
   SUBJECTIVE: The patient is doing well today s/p ablation.  At this time, he denies chest pain, shortness of breath, or any new concerns.     . furosemide  20 mg Oral TID  . glimepiride  2 mg Oral Daily  . lidocaine  3 mL Infiltration Once  . metFORMIN  1,000 mg Oral BID WC  . potassium chloride  10 mEq Oral Daily  . simvastatin  20 mg Oral QHS  . sodium chloride  3 mL Intravenous Q12H  . warfarin  4 mg Oral q1800  . Warfarin - Physician Dosing Inpatient   Does not apply q1800  . DISCONTD: lidocaine  1 mL Infiltration Once  . DISCONTD: warfarin  4 mg Oral Daily      OBJECTIVE: Physical Exam: Filed Vitals:   09/21/11 1600 09/21/11 2100 09/21/11 2141 09/22/11 0500  BP: 109/51 99/67 107/69 113/75  Pulse: 82 77  90  Temp:  98.3 F (36.8 C)  98 F (36.7 C)  TempSrc:  Oral  Oral  Resp:  20  20  Height:      Weight:      SpO2:  95%  92%    Intake/Output Summary (Last 24 hours) at 09/22/11 0823 Last data filed at 09/21/11 2138  Gross per 24 hour  Intake   1663 ml  Output      0 ml  Net   1663 ml    Telemetry reveals sinus rhythm  GEN- The patient is well appearing, alert and oriented x 3 today.   Head- normocephalic, atraumatic Eyes-  Sclera clear, conjunctiva pink Ears- hearing intact Oropharynx- clear Neck- supple, no JVP Lymph- no cervical lymphadenopathy Lungs- Clear to ausculation bilaterally, normal work of breathing Heart- Regular rate and rhythm, no murmurs, rubs or gallops, PMI not laterally displaced GI- soft, NT, ND, + BS Extremities- no clubbing, cyanosis, or edema    ASSESSMENT AND PLAN:  Active Problems:  SLEEP APNEA, OBSTRUCTIVE, SEVERE  Atrial flutter  1. Atrial flutter- doing well s/p ablation Stop diltiazem Resume other home medicines Continue coumadin until he sees me in 4 weeks  DC to home today Follow-up with Dr Waldron Labs in [place] in 2 weeks Follow-up with me in 4 weeks.  Hillis Range, MD 09/22/2011 8:23 AM

## 2011-09-22 NOTE — Discharge Summary (Signed)
ELECTROPHYSIOLOGY DISCHARGE SUMMARY    Patient ID: [name],  MRN: [id number], DOB/AGE: November 24, 1955 56 y.o.  Admit date: 09/21/2011 Discharge date: 09/22/2011  Primary Care Physician: Burnadette Pop, MD Primary Cardiologist: Gwen Pounds, MD Primary Electrophysiologist: Johney Frame, MD  Primary Discharge Diagnosis:  1.  Typical atrial flutter s/p RF ablation  Secondary Discharge Diagnoses:  1.  Atrial fibrillation 2.  Severe OSA 3.  Nonischemic cardiomyopathy, EF 25% 4.  Chronic systolic CHF 5.  HTN 6.  DM 7.  Dyslipidemia 8.  Obesity  Procedures This Admission: 1.  EP study with 3-D mapping and RF ablation of counterclockwise isthmus-dependent right atrial flutter - conclusions as follows: 1. Counterclockwise isthmus-dependent right atrial flutter upon presentation, successfully ablated along the usual cavotricuspid isthmus. 2. Complete bidirectional cavotricuspid isthmus block achieved. 3. No inducible arrhythmias following ablation. 4. No early apparent complications.  History and Hospital Course:  [name] is a 56 year old gentleman with atrial flutter, nonischemic cardiomyopathy, EF 25%, severe OSA, HTN, DM and dyslipidemia who was referred to Dr. Johney Frame by his primary cardiologist, Dr. Gwen Pounds, for EP recommendations regarding treatment of atrial flutter. [name] was experiencing symptomatic atrial flutter with SOB, fatigue and decreased exercise tolerance. His rate was also difficult to control despite metoprolol and diltiazem. Of note, he carries a diagnosis of atrial fibrillation as well, though this is not well documented. Dr. Gwen Pounds stated that the majority of the patient's arrhythmia appears to be atrial flutter.Given that his major arrhythmia burden appears to be from atrial flutter, Dr. Johney Frame recommended catheter ablation for atrial flutter and surveillance for AF in the future. After informed consent, [name] underwent EP study with 3-D mapping and RF ablation of  typical isthmus-dependent atrial flutter on 09/21/2011 by Dr. Johney Frame. There were no immediate complications. The patient complained of SOB and "choking" sensation several hours after his procedure; however, his physical exam was unremarkable and he was hemodynamically stable. His chest x-ray showed stable cardiomegaly with pulmonary venous congestion. No pulmonary edema. No pneumothorax. His symptoms were felt to be related to his severe OSA and noncompliance with CPAP. His symptoms improved/resolved without recurrence and he remains hemodynamically stable. He has been ambulating without difficulty. On telemetry, he remains in NSR. He has been given wound care and post ablation discharge instructions. He has been seen, examined and deemed stable for discharge today by Dr. Hillis Range.  Discharge Vitals: Blood pressure 113/75, pulse 90, temperature 98 F (36.7 C), temperature source Oral, resp. rate 20, height 6' (1.829 m), weight 302 lb (136.986 kg), SpO2 92.00%.   Labs: Component Value Date   WBC 11.0* 08/03/2011   HGB 15.2 08/03/2011   HCT 44.7 08/03/2011   MCV 90.9 08/03/2011   PLT 195 08/03/2011     Basename 09/22/11 0625  INR 2.79*    Disposition:  The patient is being discharged in stable condition.  Follow-up:  Follow up with Hillis Range, MD on 10/20/2011. (At 9:30 AM)    Contact information:   [address] [place] [address] [phone]    Follow up with Lamar Blinks, MD on 10/05/2011. (At 2:00 PM)    Contact information:   [address] [place] [address] [phone]   Discharge Medications:   STOP taking these medications     diltiazem 240 MG 24 hr capsule       TAKE these medications     furosemide 20 MG tablet   Commonly known as: LASIX   Take  20 mg by mouth 3 (three) times daily.      glimepiride 2 MG tablet   Commonly known as: AMARYL   Take 2 mg by mouth daily.      LORazepam 0.5 MG tablet   Commonly  known as: ATIVAN   Take 0.5 mg by mouth daily as needed. For panic attacks      metFORMIN 1000 MG tablet   Commonly known as: GLUCOPHAGE   Take 1,000 mg by mouth 2 (two) times daily with a meal.      metoprolol tartrate 25 MG tablet   Commonly known as: LOPRESSOR   Take 25 mg by mouth 3 (three) times daily.      potassium chloride 10 MEQ tablet   Commonly known as: K-DUR,KLOR-CON   Take 10 mEq by mouth daily.      simvastatin 20 MG tablet   Commonly known as: ZOCOR   Take 20 mg by mouth every evening.      warfarin 4 MG tablet   Commonly known as: COUMADIN   Take 4 mg by mouth daily.      Duration of Discharge Encounter: Greater than 30 minutes including physician time.  Signed, Rick Duff, PA-C 09/22/2011, 8:45 AM

## 2011-09-22 NOTE — Progress Notes (Signed)
DC orders received.  Patient stable with no S/S of distress. Medication and discharge information reviewed with patient.  Patient DC home. Phillips, Zakyra [place] Marie  

## 2011-09-22 NOTE — Discharge Instructions (Signed)
Keep procedure site clean & dry. If you notice increased pain, swelling, bleeding or pus, call or return!  You may shower, but no soaking baths/hot tubs/pools for 1 week. No driving for 3 days. No lifting over 5 lbs for 1 week.  

## 2011-10-06 ENCOUNTER — Encounter: Payer: Self-pay | Admitting: Internal Medicine

## 2011-10-20 ENCOUNTER — Encounter: Payer: Medicare Other | Admitting: Internal Medicine

## 2012-02-19 ENCOUNTER — Emergency Department: Payer: Self-pay | Admitting: Internal Medicine

## 2012-02-19 LAB — CBC
HCT: 46.4 % (ref 40.0–52.0)
HGB: 15.9 g/dL (ref 13.0–18.0)
MCH: 31.9 pg (ref 26.0–34.0)
MCHC: 34.2 g/dL (ref 32.0–36.0)
RDW: 13.9 % (ref 11.5–14.5)

## 2012-02-19 LAB — COMPREHENSIVE METABOLIC PANEL
Albumin: 3.5 g/dL (ref 3.4–5.0)
Anion Gap: 7 (ref 7–16)
BUN: 8 mg/dL (ref 7–18)
Co2: 30 mmol/L (ref 21–32)
Creatinine: 0.63 mg/dL (ref 0.60–1.30)
EGFR (African American): >60
Osmolality: 279 (ref 275–301)
SGOT(AST): 20 U/L (ref 15–37)
Total Protein: 6.8 g/dL (ref 6.4–8.2)

## 2012-02-19 LAB — URINALYSIS, COMPLETE
Bacteria: NONE SEEN
Blood: NEGATIVE
Glucose,UR: NEGATIVE mg/dL (ref 0–75)
Ketone: NEGATIVE
Leukocyte Esterase: NEGATIVE
RBC,UR: NONE SEEN /HPF (ref 0–5)
Specific Gravity: 1.01 (ref 1.003–1.030)
Squamous Epithelial: NONE SEEN

## 2012-02-19 LAB — TROPONIN I: Troponin-I: <0.02 ng/mL

## 2012-03-26 HISTORY — PX: CARDIOVASCULAR STRESS TEST: SHX262

## 2012-04-22 ENCOUNTER — Emergency Department: Payer: Self-pay | Admitting: Emergency Medicine

## 2012-04-22 LAB — PRO B NATRIURETIC PEPTIDE: B-Type Natriuretic Peptide: 39 pg/mL (ref 0–125)

## 2012-04-22 LAB — CBC
Platelet: 203 10*3/uL (ref 150–440)
RBC: 5.31 10*6/uL (ref 4.40–5.90)
RDW: 13.1 % (ref 11.5–14.5)
WBC: 8.2 10*3/uL (ref 3.8–10.6)

## 2012-04-22 LAB — COMPREHENSIVE METABOLIC PANEL
BUN: 6 mg/dL — ABNORMAL LOW (ref 7–18)
Calcium, Total: 8.9 mg/dL (ref 8.5–10.1)
Co2: 29 mmol/L (ref 21–32)
EGFR (African American): >60
EGFR (Non-African Amer.): >60
Glucose: 108 mg/dL — ABNORMAL HIGH (ref 65–99)
SGOT(AST): 29 U/L (ref 15–37)
SGPT (ALT): 33 U/L (ref 12–78)

## 2012-04-22 LAB — RAPID INFLUENZA A&B ANTIGENS

## 2012-04-26 HISTORY — PX: US ECHOCARDIOGRAPHY: HXRAD669

## 2013-01-23 ENCOUNTER — Observation Stay: Payer: Self-pay | Admitting: Specialist

## 2013-01-23 LAB — COMPREHENSIVE METABOLIC PANEL
Albumin: 3.4 g/dL (ref 3.4–5.0)
Alkaline Phosphatase: 102 U/L (ref 50–136)
Anion Gap: 4 — ABNORMAL LOW (ref 7–16)
BUN: 9 mg/dL (ref 7–18)
Calcium, Total: 9.2 mg/dL (ref 8.5–10.1)
Chloride: 102 mmol/L (ref 98–107)
Co2: 29 mmol/L (ref 21–32)
Creatinine: 0.65 mg/dL (ref 0.60–1.30)
Osmolality: 271 (ref 275–301)
Potassium: 4.2 mmol/L (ref 3.5–5.1)
SGOT(AST): 14 U/L — ABNORMAL LOW (ref 15–37)
SGPT (ALT): 23 U/L (ref 12–78)
Sodium: 135 mmol/L — ABNORMAL LOW (ref 136–145)
Total Protein: 6.9 g/dL (ref 6.4–8.2)

## 2013-01-23 LAB — TROPONIN I
Troponin-I: <0.02 ng/mL
Troponin-I: <0.02 ng/mL

## 2013-01-23 LAB — CBC
HCT: 49.1 % (ref 40.0–52.0)
HGB: 17 g/dL (ref 13.0–18.0)
MCH: 30.6 pg (ref 26.0–34.0)
MCHC: 34.6 g/dL (ref 32.0–36.0)
MCV: 89 fL (ref 80–100)
Platelet: 202 10*3/uL (ref 150–440)
RBC: 5.54 10*6/uL (ref 4.40–5.90)
RDW: 13.4 % (ref 11.5–14.5)
WBC: 11.1 10*3/uL — ABNORMAL HIGH (ref 3.8–10.6)

## 2013-01-23 LAB — LIPASE, BLOOD: Lipase: 88 U/L (ref 73–393)

## 2013-01-24 LAB — BASIC METABOLIC PANEL
Anion Gap: 4 — ABNORMAL LOW (ref 7–16)
BUN: 9 mg/dL (ref 7–18)
Calcium, Total: 9 mg/dL (ref 8.5–10.1)
Chloride: 103 mmol/L (ref 98–107)
EGFR (Non-African Amer.): >60
Glucose: 121 mg/dL — ABNORMAL HIGH (ref 65–99)
Osmolality: 274 (ref 275–301)
Potassium: 4.1 mmol/L (ref 3.5–5.1)
Sodium: 137 mmol/L (ref 136–145)

## 2013-01-24 LAB — LIPID PANEL
Cholesterol: 203 mg/dL — ABNORMAL HIGH (ref 0–200)
VLDL Cholesterol, Calc: 71 mg/dL — ABNORMAL HIGH (ref 5–40)

## 2013-01-24 LAB — MAGNESIUM: Magnesium: 1.9 mg/dL

## 2013-01-29 ENCOUNTER — Telehealth: Payer: Self-pay | Admitting: Family Medicine

## 2013-01-29 NOTE — Telephone Encounter (Signed)
Pt was admitted into the hospital last week [facility]) and discharged on Thursday, January 25, 2013.  The hospital told him he needs to follow up w/his PCP in two weeks, but he doesn't have a PCP.  While in the hospital they told him he has cardiomyopathy, high b/p, high cholesterol, and untreated diabetes.  The first new pt apptmt you have have for a [facility] pt is not until 05/08/2013.  Can you accommodate a new pt apptmt for him w/in the 2 week time period? Thank you.

## 2013-01-30 NOTE — Telephone Encounter (Signed)
May place 02/06/2013 at 3pm. plz obtain hospital records.

## 2013-01-31 NOTE — Telephone Encounter (Signed)
Scheduled pt for 02/06/2013 @ 3 p.m. Asked him to bring hospital records

## 2013-02-06 ENCOUNTER — Ambulatory Visit (INDEPENDENT_AMBULATORY_CARE_PROVIDER_SITE_OTHER): Payer: Medicare Other | Admitting: Family Medicine

## 2013-02-06 ENCOUNTER — Encounter: Payer: Self-pay | Admitting: *Deleted

## 2013-02-06 ENCOUNTER — Encounter: Payer: Self-pay | Admitting: Family Medicine

## 2013-02-06 VITALS — BP 130/84 | HR 84 | Temp 98.2°F | Ht 72.0 in | Wt 272.0 lb

## 2013-02-06 DIAGNOSIS — I519 Heart disease, unspecified: Secondary | ICD-10-CM

## 2013-02-06 DIAGNOSIS — I4892 Unspecified atrial flutter: Secondary | ICD-10-CM

## 2013-02-06 DIAGNOSIS — G4733 Obstructive sleep apnea (adult) (pediatric): Secondary | ICD-10-CM

## 2013-02-06 DIAGNOSIS — F411 Generalized anxiety disorder: Secondary | ICD-10-CM

## 2013-02-06 DIAGNOSIS — E1165 Type 2 diabetes mellitus with hyperglycemia: Secondary | ICD-10-CM

## 2013-02-06 DIAGNOSIS — E785 Hyperlipidemia, unspecified: Secondary | ICD-10-CM

## 2013-02-06 DIAGNOSIS — R079 Chest pain, unspecified: Secondary | ICD-10-CM

## 2013-02-06 DIAGNOSIS — I428 Other cardiomyopathies: Secondary | ICD-10-CM

## 2013-02-06 DIAGNOSIS — F419 Anxiety disorder, unspecified: Secondary | ICD-10-CM

## 2013-02-06 DIAGNOSIS — I10 Essential (primary) hypertension: Secondary | ICD-10-CM

## 2013-02-06 DIAGNOSIS — E669 Obesity, unspecified: Secondary | ICD-10-CM

## 2013-02-06 DIAGNOSIS — M542 Cervicalgia: Secondary | ICD-10-CM

## 2013-02-06 MED ORDER — LORAZEPAM 0.5 MG PO TABS: 0.5000 mg | ORAL_TABLET | Freq: Every day | ORAL | Status: DC | PRN
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
## 2013-02-06 NOTE — Patient Instructions (Addendum)
Restart metformin 1000mg  twice daily. Return in 3 months for follow up, prior fasting for blood work. I will refill ativan - use sparingly for panic attacks. See Marcelle Smiling to fill controlled substance agreement today.

## 2013-02-07 ENCOUNTER — Encounter: Payer: Self-pay | Admitting: Family Medicine

## 2013-02-07 ENCOUNTER — Other Ambulatory Visit: Payer: Self-pay | Admitting: Family Medicine

## 2013-02-07 DIAGNOSIS — I10 Essential (primary) hypertension: Secondary | ICD-10-CM | POA: Insufficient documentation

## 2013-02-07 DIAGNOSIS — F410 Panic disorder [episodic paroxysmal anxiety] without agoraphobia: Secondary | ICD-10-CM | POA: Insufficient documentation

## 2013-02-07 DIAGNOSIS — R079 Chest pain, unspecified: Secondary | ICD-10-CM | POA: Insufficient documentation

## 2013-02-07 NOTE — Assessment & Plan Note (Addendum)
Body mass index is 36.88 kg/(m^2).  Given comorbidities present, classified as morbidly obese.

## 2013-02-07 NOTE — Assessment & Plan Note (Signed)
Describes recurrent panic attacks.  States he's had trial of several medications including buspar, prozac. States ativan has worked the best for these attacks. I advised I would fill #30 ativan 0.5mg  today and this should last him next several months.   Discussed concerns with addictive potential of med, as well as dependence/tolerance potential and discussed.if needing frequent ativan would recommend counseling or another trial of daily anxiety medication. Controlled substance agreement reviewed and signed, reviewed expectations to be able to continue filling these here. Pt agrees with plan.

## 2013-02-07 NOTE — Assessment & Plan Note (Signed)
Discussed importance of cholesterol control in setting of DM - will discuss retrial of statin in future

## 2013-02-07 NOTE — Assessment & Plan Note (Signed)
Recent hospitalization for chest pain with cough - cough has largely resolved after he discontinued ACEI. I have requested records from [facility] hospitalization to review. From paperwork he brings today: CR 0.8, A1C 7%, TC 203, Trig 355, HDL 34, LDL 98,TSH 1.9, lipase WNL

## 2013-02-07 NOTE — Assessment & Plan Note (Signed)
Encouraged continued abstinence. 

## 2013-02-07 NOTE — Assessment & Plan Note (Signed)
I have requested records from Dr. Philemon Kingdom office today.

## 2013-02-07 NOTE — Assessment & Plan Note (Signed)
Stable on coreg only.  As far as I can tell today, ACEI was stopped 2/2 dry cough.  Consider ARB. Monitor for now only on coreg as bp seems stable today.Marland Kitchen

## 2013-02-07 NOTE — Assessment & Plan Note (Addendum)
Has been nonadherent recently. Advised to slowly titrate metformin back to 1000mg  bid, then will reassess need for second agent. Advised to check with local pharmacist for glucometer education, if difficulty there to come here for diabetes education with our RN Carlena Sax.

## 2013-02-07 NOTE — Assessment & Plan Note (Signed)
Will obtain records from [facility] clinic cardiology.

## 2013-02-07 NOTE — Assessment & Plan Note (Signed)
Not currently compliant with CPAP - advised to restart use.

## 2013-02-07 NOTE — Assessment & Plan Note (Signed)
S/p ablation.  Off coumadin. Regular today.

## 2013-02-13 ENCOUNTER — Telehealth: Payer: Self-pay | Admitting: Family Medicine

## 2013-02-13 ENCOUNTER — Encounter: Payer: Self-pay | Admitting: Family Medicine

## 2013-02-13 NOTE — Telephone Encounter (Signed)
Drug screen tested positive for marijuana - advised needs to stop marijuana - if rpt positive test I will no longer prescribe lorazepam. Pt expresses understanding. Pt states longterm smoker.

## 2013-02-23 ENCOUNTER — Encounter: Payer: Self-pay | Admitting: Family Medicine

## 2013-03-14 ENCOUNTER — Encounter: Payer: Self-pay | Admitting: Family Medicine

## 2013-03-14 ENCOUNTER — Ambulatory Visit (INDEPENDENT_AMBULATORY_CARE_PROVIDER_SITE_OTHER): Payer: Medicare Other | Admitting: Family Medicine

## 2013-03-14 VITALS — BP 136/88 | HR 80 | Temp 98.1°F | Wt 266.5 lb

## 2013-03-14 DIAGNOSIS — E1165 Type 2 diabetes mellitus with hyperglycemia: Secondary | ICD-10-CM

## 2013-03-14 DIAGNOSIS — R5381 Other malaise: Secondary | ICD-10-CM

## 2013-03-14 DIAGNOSIS — I10 Essential (primary) hypertension: Secondary | ICD-10-CM

## 2013-03-14 DIAGNOSIS — IMO0001 Reserved for inherently not codable concepts without codable children: Secondary | ICD-10-CM

## 2013-03-14 LAB — TSH: TSH: 2.32 u[IU]/mL (ref 0.35–5.50)

## 2013-03-14 LAB — CBC WITH DIFFERENTIAL/PLATELET
Eosinophils Relative: 1.5 % (ref 0.0–5.0)
HCT: 48.5 % (ref 39.0–52.0)
Lymphocytes Relative: 24.5 % (ref 12.0–46.0)
Lymphs Abs: 2.7 10*3/uL (ref 0.7–4.0)
Monocytes Relative: 8.8 % (ref 3.0–12.0)
Neutrophils Relative %: 64.8 % (ref 43.0–77.0)
Platelets: 241 10*3/uL (ref 150.0–400.0)
WBC: 10.9 10*3/uL — ABNORMAL HIGH (ref 4.5–10.5)

## 2013-03-14 LAB — POCT URINALYSIS DIPSTICK
Bilirubin, UA: NEGATIVE
Glucose, UA: NEGATIVE
Leukocytes, UA: NEGATIVE
Nitrite, UA: NEGATIVE

## 2013-03-14 LAB — COMPREHENSIVE METABOLIC PANEL
AST: 15 U/L (ref 0–37)
CO2: 29 mEq/L (ref 19–32)
Calcium: 9.8 mg/dL (ref 8.4–10.5)
Glucose, Bld: 139 mg/dL — ABNORMAL HIGH (ref 70–99)
Potassium: 5 mEq/L (ref 3.5–5.1)
Sodium: 138 mEq/L (ref 135–145)
Total Bilirubin: 0.6 mg/dL (ref 0.3–1.2)

## 2013-03-14 LAB — MICROALBUMIN / CREATININE URINE RATIO
Creatinine,U: 283.3 mg/dL
Microalb Creat Ratio: 0.7 mg/g (ref 0.0–30.0)
Microalb, Ur: 1.9 mg/dL (ref 0.0–1.9)

## 2013-03-14 LAB — HIGH SENSITIVITY CRP: CRP, High Sensitivity: 4.87 mg/L (ref 0.000–5.000)

## 2013-03-14 NOTE — Assessment & Plan Note (Signed)
Check A1c today. Check microalbuminuria.

## 2013-03-14 NOTE — Patient Instructions (Addendum)
Blood work today - urine checked today. I'm sorry you're not feeling well.  Hopefully the blood work will provide more answers. In interim, push fluids and plenty of rest. Restart aspirin.  May hold other meds until you start feeling better but then I recommend slowly restarting.

## 2013-03-14 NOTE — Progress Notes (Signed)
  Subjective:    Patient ID: [name], male    DOB: 1955/11/25, 57 y.o.   MRN: [id number]  HPI CC: "I feel lousy"  Established with me last month.  No records from prior docs (Dr. Gwen Pounds, Burnadette Pop) received yet.  Saw Dr. Gwen Pounds last week with echo, told overall normal, rec f/u in 6 months.  Feeling ill since hospitalization for last month, worsening over the last week.  Feels "like when I had pneumonia".  Pain in back, bloating in abdomen (takes antacid for this digel), fluttering in chest wall with dyspnea, decreased appetite, decreased BMs (thinks due to decreased PO intake).  Fatigued.  No fevers/chills, no cough, no abd pain, diarrhea, nausea/vomiting, blood in stool.  No recent tick bites.  No back pain. No joint pains. Ex smoker.  Stopped about 4 months ago.  No urinary changes.  Actually has been off meds over last 2 days 2/2 malaise. No results found for this basename: HGBA1C    Past Medical History  Diagnosis Date  . Obesity   . Hypertension   . Hyperlipidemia   . Obstructive sleep apnea     moderately compliant with CPAP  . Anxiety   . Nonischemic cardiomyopathy     EF 25% per Dr Gwen Pounds  . Chronic systolic dysfunction of left ventricle     NYHA Class II/III  . Angina   . Atrial flutter     typical appearing  . Atrial fibrillation   . Diabetes type 2, uncontrolled   . Arthritis   . Frequent headaches   . Seasonal allergies   . CAD (coronary artery disease)   . Migraine   . Ex-smoker   . Cannabis abuse      Review of Systems Per HPI    Objective:   Physical Exam  Nursing note and vitals reviewed. Constitutional: He appears well-developed and well-nourished. No distress.  HENT:  Head: Normocephalic and atraumatic.  Right Ear: Hearing, tympanic membrane, external ear and ear canal normal.  Left Ear: Hearing, tympanic membrane, external ear and ear canal normal.  Nose: Nose normal. No mucosal edema or rhinorrhea.  Mouth/Throat: Uvula is  midline, oropharynx is clear and moist and mucous membranes are normal. No oropharyngeal exudate, posterior oropharyngeal edema, posterior oropharyngeal erythema or tonsillar abscesses.  Eyes: Conjunctivae and EOM are normal. Pupils are equal, round, and reactive to light. No scleral icterus.  Neck: Normal range of motion. Neck supple.  Cardiovascular: Normal rate, regular rhythm, normal heart sounds and intact distal pulses.   No murmur heard. Pulmonary/Chest: Effort normal and breath sounds normal. No respiratory distress. He has no wheezes. He has no rales.  Musculoskeletal: He exhibits no edema.  Lymphadenopathy:    He has no cervical adenopathy.  Skin: Skin is warm and dry. No rash noted.      Assessment & Plan:  In office he had an episode where he felt "like every cell on my body is in overdrive" with normal vitals including HR 80.  This episode was associated with bilateral superior chest wall pain reproducible to palpation, sensation of dyspnea (but able to take deep breaths), and flushed feeling.

## 2013-03-14 NOTE — Progress Notes (Signed)
Pre-visit discussion using our clinic review tool. No additional management support is needed unless otherwise documented below in the visit note.  

## 2013-03-14 NOTE — Assessment & Plan Note (Signed)
Unclear etiology of sxs in office today - I will check UA (WNL), as well as blood work including CBC, CMP, TSH, and CRP. Further eval will be dictated by results of blood work today. I have again requested records from cardiology - but per patient has had recent normal evaluation by Dr. Gwen Pounds. In office episode today more consistent with anxiety attack than organic cause of symptoms. Discussed how if all blood work returns normal, may focus on better anxiety control for ?anxiety attacks. No evidence of respiratory infection today.

## 2013-03-21 ENCOUNTER — Other Ambulatory Visit: Payer: Self-pay | Admitting: Family Medicine

## 2013-03-21 MED ORDER — CITALOPRAM HYDROBROMIDE 10 MG PO TABS: 10.0000 mg | ORAL_TABLET | Freq: Every day | ORAL | Status: DC

## 2013-04-02 ENCOUNTER — Telehealth: Payer: Self-pay | Admitting: Family Medicine

## 2013-04-02 DIAGNOSIS — R5381 Other malaise: Secondary | ICD-10-CM

## 2013-04-02 NOTE — Telephone Encounter (Signed)
Pt states that Hodgkin's Disease runs in his family.  He wants to know if the blood tests we took recently would have told whether or not he has Hodgkins.  He said if not, then he wants to come in for the testing.

## 2013-04-02 NOTE — Telephone Encounter (Signed)
plz notify there's no one blood test for hodgkin's.  Needs to monitor for weight loss and swollen glands.   If not feeling better he can come in for a second blood test.

## 2013-04-03 NOTE — Telephone Encounter (Signed)
Patient notified. He hasn't noticed either one. He was just asking because he had been talking with his mother and his sister at thanksgiving. They both have it and his mother's started with itchy legs (which he has) and his sister's started with SOB (which he has). They were concerned because there was no definitive answer as to why he felt so bad when he was in before and he just wanted further labs if possible.

## 2013-04-24 ENCOUNTER — Other Ambulatory Visit: Payer: Self-pay | Admitting: Family Medicine

## 2013-04-24 MED ORDER — CITALOPRAM HYDROBROMIDE 10 MG PO TABS: 10.0000 mg | ORAL_TABLET | Freq: Every day | ORAL | Status: DC

## 2013-04-24 NOTE — Telephone Encounter (Signed)
Pt requesting 90 day supply

## 2013-04-26 DIAGNOSIS — R892 Abnormal level of other drugs, medicaments and biological substances in specimens from other organs, systems and tissues: Secondary | ICD-10-CM

## 2013-04-26 HISTORY — DX: Abnormal level of other drugs, medicaments and biological substances in specimens from other organs, systems and tissues: R89.2

## 2013-04-28 ENCOUNTER — Other Ambulatory Visit: Payer: Self-pay | Admitting: Family Medicine

## 2013-04-28 DIAGNOSIS — IMO0002 Reserved for concepts with insufficient information to code with codable children: Secondary | ICD-10-CM

## 2013-04-28 DIAGNOSIS — Z125 Encounter for screening for malignant neoplasm of prostate: Secondary | ICD-10-CM

## 2013-04-28 DIAGNOSIS — E1165 Type 2 diabetes mellitus with hyperglycemia: Secondary | ICD-10-CM

## 2013-04-28 DIAGNOSIS — E785 Hyperlipidemia, unspecified: Secondary | ICD-10-CM

## 2013-04-28 DIAGNOSIS — R5381 Other malaise: Secondary | ICD-10-CM

## 2013-04-28 DIAGNOSIS — I10 Essential (primary) hypertension: Secondary | ICD-10-CM

## 2013-05-03 ENCOUNTER — Other Ambulatory Visit: Payer: Medicare Other

## 2013-05-04 ENCOUNTER — Other Ambulatory Visit: Payer: Medicare Other

## 2013-05-06 ENCOUNTER — Encounter: Payer: Self-pay | Admitting: Family Medicine

## 2013-05-09 ENCOUNTER — Encounter: Payer: Medicare Other | Admitting: Family Medicine

## 2013-05-10 ENCOUNTER — Encounter: Payer: Self-pay | Admitting: Family Medicine

## 2013-05-10 ENCOUNTER — Telehealth: Payer: Self-pay

## 2013-05-10 ENCOUNTER — Ambulatory Visit (INDEPENDENT_AMBULATORY_CARE_PROVIDER_SITE_OTHER): Payer: Medicare Other | Admitting: Family Medicine

## 2013-05-10 VITALS — BP 160/100 | HR 80 | Temp 98.1°F | Wt 280.0 lb

## 2013-05-10 DIAGNOSIS — E119 Type 2 diabetes mellitus without complications: Secondary | ICD-10-CM

## 2013-05-10 DIAGNOSIS — IMO0002 Reserved for concepts with insufficient information to code with codable children: Secondary | ICD-10-CM

## 2013-05-10 DIAGNOSIS — L03119 Cellulitis of unspecified part of limb: Secondary | ICD-10-CM | POA: Insufficient documentation

## 2013-05-10 DIAGNOSIS — I10 Essential (primary) hypertension: Secondary | ICD-10-CM

## 2013-05-10 MED ORDER — GLUCOSE BLOOD VI STRP: ORAL_STRIP | Status: DC

## 2013-05-10 MED ORDER — POTASSIUM CHLORIDE CRYS ER 10 MEQ PO TBCR: 10.0000 meq | EXTENDED_RELEASE_TABLET | Freq: Every day | ORAL | Status: DC

## 2013-05-10 MED ORDER — DOXYCYCLINE HYCLATE 100 MG PO CAPS: 100.0000 mg | ORAL_CAPSULE | Freq: Two times a day (BID) | ORAL | Status: DC

## 2013-05-10 MED ORDER — ACCU-CHEK FASTCLIX LANCETS MISC: Status: DC

## 2013-05-10 MED ORDER — ACCU-CHEK NANO SMARTVIEW W/DEVICE KIT: PACK | Status: DC

## 2013-05-10 MED ORDER — FUROSEMIDE 20 MG PO TABS: 20.0000 mg | ORAL_TABLET | Freq: Two times a day (BID) | ORAL | Status: DC | PRN

## 2013-05-10 MED ORDER — ONETOUCH ULTRA SYSTEM W/DEVICE KIT: 1.0000 | PACK | Freq: Once | Status: DC

## 2013-05-10 NOTE — Assessment & Plan Note (Signed)
Encouraged restart metformin, sent prescription for glucose meter and test strips to check once daily.

## 2013-05-10 NOTE — Progress Notes (Signed)
   Subjective:    Patient ID: [name], male    DOB: 20-Sep-1955, 58 y.o.   MRN: [id number]  HPI CC: eval L elbow  3 nights ago fell asleep on floor on left elbow.  When he awoke, had redness and swelling of elbow.  Did have pain throughout left hand.  Treated with triple abx ointment which has been improving sxs.  Pain > itching.  Prior affected mobility but now able to fully extend.  Denies fevers/chills, history of gout.  BP elevated again today.  Has not been taking his carvedilol.  Had been taking furosemide and potassium but ran out.  Was confused about meds he should be taking. BP Readings from Last 3 Encounters:  05/10/13 160/100  03/14/13 136/88  02/06/13 130/84   DM - has not been checking his sugars because dogs broke his meter.  Requests refill of lifestyle meter and strips. Lab Results  Component Value Date   HGBA1C 6.7* 03/14/2013    Past Medical History  Diagnosis Date  . Obesity   . Hypertension   . Hyperlipidemia   . OSA on CPAP     moderately compliant with CPAP  . Anxiety   . Nonischemic cardiomyopathy     EF 25% per Dr Nehemiah Massed  . Angina   . History of atrial fibrillation 2013    chronic, s/p ablation prior on coumadin  . Diabetes type 2, uncontrolled   . Arthritis   . Frequent headaches   . Seasonal allergies   . CAD (coronary artery disease)     nonobstructive  . Migraine   . Ex-smoker   . Cannabis abuse   . Dilated cardiomyopathy 2013    now improved  . Chronic systolic CHF (congestive heart failure) 2013    NYHA Class II/III    Review of Systems Per HPI    Objective:   Physical Exam  Nursing note and vitals reviewed. Constitutional: He appears well-developed and well-nourished. No distress.  Musculoskeletal: He exhibits no edema.  FROM bilateral elbows. Erythema surrounding left olecranon bursa, no significant tenderness.  Annular maculopapular rash surrounding olecranon bursa. No axillary LAD       Assessment & Plan:

## 2013-05-10 NOTE — Progress Notes (Signed)
Pre-visit discussion using our clinic review tool. No additional management support is needed unless otherwise documented below in the visit note.  

## 2013-05-10 NOTE — Telephone Encounter (Signed)
Pt said insurance will not cover one touch glucose meter or supplies; pt request substitute of accu check nano with test strips and lancets. Spoke with Vicente Males pharmacist at [facility]; accu chek nano uses smartview test strips and fastclix lancets. Advised pt done.

## 2013-05-10 NOTE — Patient Instructions (Addendum)
I've refilled lasix and potassium, glucose meter and test strips. I think you had elbow skin infection and possible bursa infection - treat with doxycyline twice daily for 10 days.  Let me know if redness or rash spreading.  Continue triple antibiotic ointment. Return to see me in 1-2 months for follow up diabetes and blood work.

## 2013-05-10 NOTE — Assessment & Plan Note (Signed)
Uncontrolled. Encouraged compliance with medication regimen. Recheck next month

## 2013-05-10 NOTE — Assessment & Plan Note (Signed)
Anticipate cellulitis after possible olecranon bursitis but improving on topical abx.  Will provide with oral abx - doxycycline 10 d course and pt to update me if rash spreading or redness/warmth develops.

## 2013-05-11 ENCOUNTER — Telehealth: Payer: Self-pay

## 2013-05-11 ENCOUNTER — Telehealth: Payer: Self-pay | Admitting: Family Medicine

## 2013-05-11 NOTE — Telephone Encounter (Signed)
Relevant patient education assigned to patient using Emmi. ° °

## 2013-06-14 ENCOUNTER — Other Ambulatory Visit: Payer: Medicare Other

## 2013-06-14 ENCOUNTER — Ambulatory Visit (INDEPENDENT_AMBULATORY_CARE_PROVIDER_SITE_OTHER): Payer: Medicare Other | Admitting: Family Medicine

## 2013-06-14 ENCOUNTER — Encounter: Payer: Self-pay | Admitting: Family Medicine

## 2013-06-14 VITALS — BP 140/96 | HR 76 | Temp 98.1°F | Wt 275.5 lb

## 2013-06-14 DIAGNOSIS — R0600 Dyspnea, unspecified: Secondary | ICD-10-CM

## 2013-06-14 DIAGNOSIS — R0989 Other specified symptoms and signs involving the circulatory and respiratory systems: Secondary | ICD-10-CM

## 2013-06-14 DIAGNOSIS — R0609 Other forms of dyspnea: Secondary | ICD-10-CM

## 2013-06-14 MED ORDER — FUROSEMIDE 20 MG PO TABS: 20.0000 mg | ORAL_TABLET | Freq: Two times a day (BID) | ORAL | Status: DC | PRN

## 2013-06-14 MED ORDER — SIMVASTATIN 20 MG PO TABS: 20.0000 mg | ORAL_TABLET | Freq: Every evening | ORAL | Status: DC

## 2013-06-14 MED ORDER — FLUTICASONE PROPIONATE 50 MCG/ACT NA SUSP: 2.0000 | Freq: Every day | NASAL | Status: DC

## 2013-06-14 MED ORDER — OMEPRAZOLE 40 MG PO CPDR: 40.0000 mg | DELAYED_RELEASE_CAPSULE | Freq: Every day | ORAL | Status: DC

## 2013-06-14 MED ORDER — LORAZEPAM 0.5 MG PO TABS: 0.5000 mg | ORAL_TABLET | Freq: Every day | ORAL | Status: DC | PRN

## 2013-06-14 NOTE — Assessment & Plan Note (Signed)
Heart and lungs clear today, no evidence of CHF exacerbation today. ?GERD/dyspepsia related.  Will treat with GasX and start 3wk course of omeprazole. nexium samples provided today Pt advised to return if sxs deteriorate or persist. rtc for AMW.

## 2013-06-14 NOTE — Progress Notes (Signed)
Pre visit review using our clinic review tool, if applicable. No additional management support is needed unless otherwise documented below in the visit note. 

## 2013-06-14 NOTE — Patient Instructions (Addendum)
Check O2 sat today. Try GasX over the counter for gas.  Start omeprazole 40mg  daily for 3 weeks then as needed.  nexium samples provided today as well. Try to avoid spicy foods, and acidic foods like citruses and tomatoes. I've refilled flonase (nasal steroid) for you for allergies. Let me know if not improving. Return for wellness exam, prior fasting for blood work.

## 2013-06-14 NOTE — Progress Notes (Signed)
   BP 140/96  Pulse 76  Temp(Src) 98.1 F (36.7 C) (Oral)  Wt 275 lb 8 oz (124.966 kg)   CC: 1 mo f/u converted to acute dyspnea visit Subjective:    Patient ID: [name], male    DOB: 06-28-1955, 58 y.o.   MRN: [id number]  HPI: [name] is a 58 y.o. male presenting on 06/14/2013 with Follow-up  "I'm having a hard time breathing" attributed to gas.  No cough or wheezing.  No fevers.  This has happened in the past, has led to panic attack.  Happened this morning. H/o GERD and gastritis in past, this feels like prior episodes. H/o OSA - but does not use CPAP machine. Ex smoker - quit 2012.  Decreased appetite, feels nauseated. Followed by Dr. Nehemiah Massed, last seen 02/2013.  Requests flonase refill.  Relevant past medical, surgical, family and social history reviewed and updated. Allergies and medications reviewed and updated. Current Outpatient Prescriptions on File Prior to Visit  Medication Sig  . ACCU-CHEK FASTCLIX LANCETS MISC Check blood sugar once daily and as instructed. Dx 250.00  . aspirin EC 81 MG tablet Take 81 mg by mouth daily.  . Blood Glucose Monitoring Suppl (ACCU-CHEK NANO SMARTVIEW) W/DEVICE KIT Ck blood sugar once daily and as directed. Dx 250.00  . carvedilol (COREG) 6.25 MG tablet Take 6.25 mg by mouth 2 (two) times daily with a meal.  . citalopram (CELEXA) 10 MG tablet Take 1 tablet (10 mg total) by mouth daily.  Marland Kitchen glucose blood (ACCU-CHEK SMARTVIEW) test strip Ck blood sugar once a day and as instructed.Dx 250.00  . metFORMIN (GLUCOPHAGE) 1000 MG tablet Take 1 tablet (1,000 mg total) by mouth 2 (two) times daily with a meal.  . potassium chloride (K-DUR,KLOR-CON) 10 MEQ tablet Take 1 tablet (10 mEq total) by mouth daily.   No current facility-administered medications on file prior to visit.    Review of Systems Per HPI unless specifically indicated above    Objective:    BP 140/96  Pulse 76  Temp(Src) 98.1 F (36.7 C) (Oral)  Wt 275  lb 8 oz (124.966 kg)  Physical Exam  Nursing note and vitals reviewed. Constitutional: He appears well-developed and well-nourished. No distress.  HENT:  Mouth/Throat: Oropharynx is clear and moist. No oropharyngeal exudate.  Neck: No hepatojugular reflux and no JVD present.  Cardiovascular: Normal rate, regular rhythm, normal heart sounds and intact distal pulses.   No murmur heard. Pulmonary/Chest: Effort normal and breath sounds normal. No respiratory distress. He has no wheezes. He has no rales.  Musculoskeletal: He exhibits no edema.  Skin: Skin is warm and dry. No rash noted.       Assessment & Plan:   Problem List Items Addressed This Visit   Dyspnea - Primary     Heart and lungs clear today, no evidence of CHF exacerbation today. ?GERD/dyspepsia related.  Will treat with GasX and start 3wk course of omeprazole. nexium samples provided today Pt advised to return if sxs deteriorate or persist. rtc for AMW.        Follow up plan: Return in about 3 months (around 09/11/2013), or if symptoms worsen or fail to improve, for wellness exam.

## 2013-06-20 ENCOUNTER — Emergency Department: Payer: Self-pay | Admitting: Internal Medicine

## 2013-06-20 LAB — HEPATIC FUNCTION PANEL A (ARMC)
ALT: 29 U/L (ref 12–78)
AST: 28 U/L (ref 15–37)
Albumin: 3.8 g/dL (ref 3.4–5.0)
Alkaline Phosphatase: 77 U/L
BILIRUBIN DIRECT: 0.1 mg/dL (ref 0.00–0.20)
Bilirubin,Total: 0.5 mg/dL (ref 0.2–1.0)
Total Protein: 6.8 g/dL (ref 6.4–8.2)

## 2013-06-20 LAB — CBC
HCT: 47.9 % (ref 40.0–52.0)
HGB: 16.4 g/dL (ref 13.0–18.0)
MCH: 30.8 pg (ref 26.0–34.0)
MCHC: 34.2 g/dL (ref 32.0–36.0)
MCV: 90 fL (ref 80–100)
PLATELETS: 207 10*3/uL (ref 150–440)
RBC: 5.32 10*6/uL (ref 4.40–5.90)
RDW: 13.3 % (ref 11.5–14.5)
WBC: 10.2 10*3/uL (ref 3.8–10.6)

## 2013-06-20 LAB — BASIC METABOLIC PANEL
Anion Gap: 6 — ABNORMAL LOW (ref 7–16)
BUN: 9 mg/dL (ref 7–18)
CREATININE: 0.82 mg/dL (ref 0.60–1.30)
Calcium, Total: 9.1 mg/dL (ref 8.5–10.1)
Chloride: 105 mmol/L (ref 98–107)
Co2: 27 mmol/L (ref 21–32)
EGFR (Non-African Amer.): >60
GLUCOSE: 125 mg/dL — AB (ref 65–99)
Osmolality: 276 (ref 275–301)
Potassium: 4.1 mmol/L (ref 3.5–5.1)
SODIUM: 138 mmol/L (ref 136–145)

## 2013-06-20 LAB — TROPONIN I

## 2013-06-24 DIAGNOSIS — R892 Abnormal level of other drugs, medicaments and biological substances in specimens from other organs, systems and tissues: Secondary | ICD-10-CM | POA: Insufficient documentation

## 2013-06-28 ENCOUNTER — Encounter: Payer: Self-pay | Admitting: Family Medicine

## 2013-06-28 ENCOUNTER — Ambulatory Visit (INDEPENDENT_AMBULATORY_CARE_PROVIDER_SITE_OTHER): Payer: Medicare Other | Admitting: Family Medicine

## 2013-06-28 ENCOUNTER — Encounter: Payer: Medicare Other | Admitting: Family Medicine

## 2013-06-28 ENCOUNTER — Encounter: Payer: Self-pay | Admitting: *Deleted

## 2013-06-28 VITALS — BP 128/88 | HR 73 | Temp 98.1°F | Wt 274.5 lb

## 2013-06-28 DIAGNOSIS — R0989 Other specified symptoms and signs involving the circulatory and respiratory systems: Secondary | ICD-10-CM

## 2013-06-28 DIAGNOSIS — Z87891 Personal history of nicotine dependence: Secondary | ICD-10-CM

## 2013-06-28 DIAGNOSIS — R0609 Other forms of dyspnea: Secondary | ICD-10-CM

## 2013-06-28 DIAGNOSIS — F410 Panic disorder [episodic paroxysmal anxiety] without agoraphobia: Secondary | ICD-10-CM

## 2013-06-28 DIAGNOSIS — R0600 Dyspnea, unspecified: Secondary | ICD-10-CM

## 2013-06-28 MED ORDER — SERTRALINE HCL 100 MG PO TABS
100.0000 mg | ORAL_TABLET | Freq: Every day | ORAL | Status: DC
Start: 2013-06-28 — End: 2014-03-04

## 2013-06-28 MED ORDER — CARVEDILOL 6.25 MG PO TABS: 6.2500 mg | ORAL_TABLET | Freq: Two times a day (BID) | ORAL | Status: DC

## 2013-06-28 NOTE — Assessment & Plan Note (Signed)
Congratulated on continued abstinence 

## 2013-06-28 NOTE — Progress Notes (Signed)
 BP 128/88  Pulse 73  Temp(Src) 98.1 F (36.7 C) (Oral)  Wt 274 lb 8 oz (124.512 kg)  SpO2 95%   CC: dyspnea  Subjective:    Patient ID: [name], male    DOB: 09/16/1955, 57 y.o.   MRN: [id number]  HPI: [name] is a 57 y.o. male presenting on 06/28/2013 with Shortness of Breath   See prior note for details.  Seen here last month with similar complaint, at that time attributed to gas and worsening dyspepsia. At that time - heart and lungs clear today, no evidence of CHF exacerbation today.  ?GERD/dyspepsia related. Will treat with GasX and start 3wk course of omeprazole.  nexium samples provided today.  Pt advised to return if sxs deteriorate or persist.  Presents today with similar sxs.  nexium samples didn't help.  Did not try omeprazole.  Endorses increasing anxiety - with dyspnea.  Unsure if dyspnea causes anxiety or if anxiety causes dyspnea.  Also with persistent gas and bloating in stomach.  Calming down helps resolve sxs.    Seen at [facility] ER, evaluated for dyspnea and chest pain.  Told heart was looking ok.  ?gallbladder, but this was normal at abd US. Has backed off caffeine, dairy.  Doesn't notice certain foods worsen sxs.  On celexa 10mg daily and lorazepam 0.5mg prn attack.  Prior on buspar. H/o OSA - doesn't use CPAP machine. Ex-smoker quit 2014. Hasn't smoked marijuana in last 3 weeks.  Relevant past medical, surgical, family and social history reviewed and updated as indicated.  Allergies and medications reviewed and updated. Current Outpatient Prescriptions on File Prior to Visit  Medication Sig  . ACCU-CHEK FASTCLIX LANCETS MISC Check blood sugar once daily and as instructed. Dx 250.00  . aspirin EC 81 MG tablet Take 81 mg by mouth daily.  . Blood Glucose Monitoring Suppl (ACCU-CHEK NANO SMARTVIEW) W/DEVICE KIT Ck blood sugar once daily and as directed. Dx 250.00  . fluticasone (FLONASE) 50 MCG/ACT nasal spray Place 2 sprays into both nostrils  daily.  . furosemide (LASIX) 20 MG tablet Take 1 tablet (20 mg total) by mouth 2 (two) times daily as needed.  . glucose blood (ACCU-CHEK SMARTVIEW) test strip Ck blood sugar once a day and as instructed.Dx 250.00  . metFORMIN (GLUCOPHAGE) 1000 MG tablet Take 1 tablet (1,000 mg total) by mouth 2 (two) times daily with a meal.  . potassium chloride (K-DUR,KLOR-CON) 10 MEQ tablet Take 1 tablet (10 mEq total) by mouth daily.  . simvastatin (ZOCOR) 20 MG tablet Take 1 tablet (20 mg total) by mouth every evening.   No current facility-administered medications on file prior to visit.    Review of Systems Per HPI unless specifically indicated above    Objective:    BP 128/88  Pulse 73  Temp(Src) 98.1 F (36.7 C) (Oral)  Wt 274 lb 8 oz (124.512 kg)  SpO2 95%  Physical Exam  Nursing note and vitals reviewed. Constitutional: He appears well-developed and well-nourished. No distress.  HENT:  Mouth/Throat: Oropharynx is clear and moist. No oropharyngeal exudate.  Cardiovascular: Normal rate, regular rhythm, normal heart sounds and intact distal pulses.   No murmur heard. Pulmonary/Chest: Effort normal and breath sounds normal. No respiratory distress. He has no wheezes. He has no rales.  Abdominal: Soft. Bowel sounds are normal. He exhibits no distension and no mass. There is no tenderness. There is no rebound and no guarding.  Musculoskeletal: He exhibits no edema.         Assessment & Plan:   Problem List Items Addressed This Visit   Dyspnea     With chest discomfort and GI distress.  States recent eval (EKG, CXR, abd [place], blood work) unrevealing at [facility] ER. Also endorses normal nuclear stress test last year at [facility] - I have requested records of this today. In ex smoker, I asked him to return for spirometry to eval lung function. States nexium didn't help-  pointing against dyspepsia. See above for anxiety plan. If persistent sxs, advised to f/u with Dr. Nehemiah Massed sooner than planned  08/2013.    Ex-smoker     Congratulated on continued abstinence.    Panic attacks - Primary     Anticipate contributing significantly to current sxs - will change celexa 10m to sertraline 1058mdaily. Advised ok to use lorazepam anxiolytic prn, may liberalize use - and will send #40 next refill. Check UDS today given high risk with use of MJ. Pt states last UDS cost him 95$, advised to notify usKoreaf this expensive next time.    Relevant Medications      sertraline (ZOLOFT) tablet      LORazepam (ATIVAN) tablet       Follow up plan: Return if symptoms worsen or fail to improve.

## 2013-06-28 NOTE — Progress Notes (Signed)
Pre visit review using our clinic review tool, if applicable. No additional management support is needed unless otherwise documented below in the visit note. 

## 2013-06-28 NOTE — Assessment & Plan Note (Addendum)
With chest discomfort and GI distress.  States recent eval (EKG, CXR, abd [place], blood work) unrevealing at [facility] ER. Also endorses normal nuclear stress test last year at [facility] - I have requested records of this today. In ex smoker, I asked him to return for spirometry to eval lung function. States nexium didn't help-  pointing against dyspepsia. See above for anxiety plan. If persistent sxs, advised to f/u with Dr. Nehemiah Massed sooner than planned 08/2013.

## 2013-06-28 NOTE — Patient Instructions (Addendum)
Sign release of records for latest cardiovascular stress test done at [facility] last year. Stop celexa, start sertraline 100mg  daily for anxiety. Use ativan up to twice daily as needed - when you feel anxiety coming on, take pill. If both these things dont help, check with Dr. Nehemiah Massed again. Urine test today - assured toxicology. Return for spirometry to check lung function. Try beano prior to meals for gas.

## 2013-06-28 NOTE — Assessment & Plan Note (Addendum)
Anticipate contributing significantly to current sxs - will change celexa 10mg  to sertraline 100mg  daily. Advised ok to use lorazepam anxiolytic prn, may liberalize use - and will send #40 next refill. Check UDS today given high risk with use of MJ. Pt states last UDS cost him 95$, advised to notify us if this expensive next time.

## 2013-06-29 DIAGNOSIS — I2510 Atherosclerotic heart disease of native coronary artery without angina pectoris: Secondary | ICD-10-CM | POA: Insufficient documentation

## 2013-06-29 DIAGNOSIS — I420 Dilated cardiomyopathy: Secondary | ICD-10-CM | POA: Insufficient documentation

## 2013-06-29 DIAGNOSIS — E782 Mixed hyperlipidemia: Secondary | ICD-10-CM

## 2013-06-29 DIAGNOSIS — E1169 Type 2 diabetes mellitus with other specified complication: Secondary | ICD-10-CM | POA: Insufficient documentation

## 2013-08-26 ENCOUNTER — Encounter: Payer: Self-pay | Admitting: Family Medicine

## 2013-08-27 ENCOUNTER — Encounter: Payer: Self-pay | Admitting: *Deleted

## 2013-08-27 ENCOUNTER — Other Ambulatory Visit: Payer: Medicare Other

## 2013-09-03 ENCOUNTER — Encounter: Payer: Self-pay | Admitting: Family Medicine

## 2013-09-03 ENCOUNTER — Ambulatory Visit (INDEPENDENT_AMBULATORY_CARE_PROVIDER_SITE_OTHER): Payer: Medicare Other | Admitting: Family Medicine

## 2013-09-03 VITALS — BP 134/90 | HR 80 | Temp 98.0°F | Ht 72.0 in | Wt 290.0 lb

## 2013-09-03 DIAGNOSIS — R222 Localized swelling, mass and lump, trunk: Secondary | ICD-10-CM | POA: Insufficient documentation

## 2013-09-03 DIAGNOSIS — I428 Other cardiomyopathies: Secondary | ICD-10-CM

## 2013-09-03 DIAGNOSIS — Z872 Personal history of diseases of the skin and subcutaneous tissue: Secondary | ICD-10-CM | POA: Insufficient documentation

## 2013-09-03 DIAGNOSIS — E785 Hyperlipidemia, unspecified: Secondary | ICD-10-CM

## 2013-09-03 DIAGNOSIS — D179 Benign lipomatous neoplasm, unspecified: Secondary | ICD-10-CM

## 2013-09-03 DIAGNOSIS — F410 Panic disorder [episodic paroxysmal anxiety] without agoraphobia: Secondary | ICD-10-CM

## 2013-09-03 DIAGNOSIS — I10 Essential (primary) hypertension: Secondary | ICD-10-CM

## 2013-09-03 DIAGNOSIS — R892 Abnormal level of other drugs, medicaments and biological substances in specimens from other organs, systems and tissues: Secondary | ICD-10-CM

## 2013-09-03 DIAGNOSIS — D72829 Elevated white blood cell count, unspecified: Secondary | ICD-10-CM

## 2013-09-03 DIAGNOSIS — Z1211 Encounter for screening for malignant neoplasm of colon: Secondary | ICD-10-CM

## 2013-09-03 DIAGNOSIS — Z8719 Personal history of other diseases of the digestive system: Secondary | ICD-10-CM | POA: Insufficient documentation

## 2013-09-03 DIAGNOSIS — E119 Type 2 diabetes mellitus without complications: Secondary | ICD-10-CM

## 2013-09-03 DIAGNOSIS — R1900 Intra-abdominal and pelvic swelling, mass and lump, unspecified site: Secondary | ICD-10-CM | POA: Insufficient documentation

## 2013-09-03 DIAGNOSIS — Z0000 Encounter for general adult medical examination without abnormal findings: Secondary | ICD-10-CM | POA: Insufficient documentation

## 2013-09-03 DIAGNOSIS — K429 Umbilical hernia without obstruction or gangrene: Secondary | ICD-10-CM

## 2013-09-03 MED ORDER — LORAZEPAM 0.5 MG PO TABS: 0.5000 mg | ORAL_TABLET | Freq: Two times a day (BID) | ORAL | Status: DC | PRN

## 2013-09-03 NOTE — Addendum Note (Signed)
Addended by: [name] on: 09/03/2013 08:51 PM   Modules accepted: Orders

## 2013-09-03 NOTE — Assessment & Plan Note (Signed)
Persistent large hernia, stable. However, given size will ask for surgical evaluation Pt agrees with plan.

## 2013-09-03 NOTE — Assessment & Plan Note (Signed)
Continue zocor 20mg  daily. Check FLP when returns fasting.

## 2013-09-03 NOTE — Progress Notes (Signed)
Pre visit review using our clinic review tool, if applicable. No additional management support is needed unless otherwise documented below in the visit note. 

## 2013-09-03 NOTE — Progress Notes (Addendum)
BP 134/90  Pulse 80  Temp(Src) 98 F (36.7 C) (Oral)  Ht 6' (1.829 m)  Wt 290 lb (131.543 kg)  BMI 39.32 kg/m2   CC: medicare wellness visit  Subjective:    Patient ID: [name], male    DOB: [birth year], 58 y.o.   MRN: [id number]  HPI: [name] is a 58 y.o. male presenting on 09/03/2013 for Annual Exam   Panic attacks - for last 40 years. Has tried several meds for this including prozac celexa and buspar - ativan seems to help the best. Only uses sparingly. Describes increased heart rate, dyspnea, sweating, dizziness, anxiety. Has seen psychiatrist in past. Feels attacks are stress related.  On zoloft 171m daily and lorazepam 0.522mprn attack.  Finds zoloft helping significantly.  Uses lorazepam rarely - has used 4 pills in last several months.  Recent abnormal UDS - unable to review as no hardcopy available - it's been sent to be scanned and still is not available in our system - but was 2nd positive marijuana screen.   Umbilical hernia - large, bulging, reducible. rec weight loss. Has not seen surgeon. Chronic since ~2006. Would like to see surgeon for this.  H/o OSA - doesn't use CPAP machine.  Stays anxious at night so rips mask off. Ex-smoker quit 2014. Intermittent MJ use. Due for blood work today.  Hearing and vision screens passed. 1 fall in last year - fell to knees after working outside and felt dizzy.  Likely was dehydrated.  Does not drink as much water as he should. Denies depression/sadness/anhedonia.  Preventative: Colon cancer screening - recommended against 2/2 umbilical hernia.  Discussed and requests immunoassay Prostate cancer screening - discussed, declines for now. Flu shot - in hospital Tetanus - thinks 2008 Pneumovax per patient done in hospital 2014 Advanced directives - has discussed with family.  No children.  HCProxy would be Dawn, girlfriend as well as sister (who lives in [place]  Does not want prolonged life support.  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  Allergies and medications reviewed and updated. Current Outpatient Prescriptions on File Prior to Visit  Medication Sig  . ACCU-CHEK FASTCLIX LANCETS MISC Check blood sugar once daily and as instructed. Dx 250.00  . aspirin EC 81 MG tablet Take 81 mg by mouth daily.  . Blood Glucose Monitoring Suppl (ACCU-CHEK NANO SMARTVIEW) W/DEVICE KIT Ck blood sugar once daily and as directed. Dx 250.00  . carvedilol (COREG) 6.25 MG tablet Take 1 tablet (6.25 mg total) by mouth 2 (two) times daily with a meal.  . fluticasone (FLONASE) 50 MCG/ACT nasal spray Place 2 sprays into both nostrils daily.  . furosemide (LASIX) 20 MG tablet Take 1 tablet (20 mg total) by mouth 2 (two) times daily as needed.  . Marland Kitchenlucose blood (ACCU-CHEK SMARTVIEW) test strip Ck blood sugar once a day and as instructed.Dx 250.00  . metFORMIN (GLUCOPHAGE) 1000 MG tablet Take 1 tablet (1,000 mg total) by mouth 2 (two) times daily with a meal.  . potassium chloride (K-DUR,KLOR-CON) 10 MEQ tablet Take 1 tablet (10 mEq total) by mouth daily.  . sertraline (ZOLOFT) 100 MG tablet Take 1 tablet (100 mg total) by mouth daily.  . simvastatin (ZOCOR) 20 MG tablet Take 1 tablet (20 mg total) by mouth every evening.  No current facility-administered medications on file prior to visit.    Review of Systems  Constitutional: Positive for unexpected weight change (gain). Negative for fever, chills, activity change, appetite change and fatigue.  HENT: Negative for hearing loss.   Eyes: Negative for visual disturbance.  Respiratory: Positive for shortness of breath (chronic) and wheezing. Negative for cough and chest tightness.   Cardiovascular: Negative for chest pain, palpitations and leg swelling.    Gastrointestinal: Negative for nausea, vomiting, abdominal pain, diarrhea, constipation, blood in stool and abdominal distention.  Genitourinary: Negative for hematuria and difficulty urinating.  Musculoskeletal: Negative for arthralgias, myalgias and neck pain.  Skin: Negative for rash.  Neurological: Negative for dizziness, seizures, syncope and headaches.  Hematological: Negative for adenopathy. Does not bruise/bleed easily.  Psychiatric/Behavioral: Negative for dysphoric mood. The patient is nervous/anxious (but improved).    Per HPI unless specifically indicated above    Objective:    BP 134/90  Pulse 80  Temp(Src) 98 F (36.7 C) (Oral)  Ht 6' (1.829 m)  Wt 290 lb (131.543 kg)  BMI 39.32 kg/m2  Physical Exam  Nursing note and vitals reviewed. Constitutional: He is oriented to person, place, and time. He appears well-developed and well-nourished. No distress.  HENT:  Head: Normocephalic and atraumatic.  Right Ear: Hearing, tympanic membrane, external ear and ear canal normal.  Left Ear: Hearing, tympanic membrane, external ear and ear canal normal.  Nose: Nose normal.  Mouth/Throat: Uvula is midline, oropharynx is clear and moist and mucous membranes are normal. No oropharyngeal exudate, posterior oropharyngeal edema or posterior oropharyngeal erythema.  Eyes: Conjunctivae and EOM are normal. Pupils are equal, round, and reactive to light. No scleral icterus.  Neck: Normal range of motion. Neck supple. Carotid bruit is not present. No thyromegaly present.  Cardiovascular: Normal rate, regular rhythm, normal heart sounds and intact distal pulses.   No murmur heard. Pulses:      Radial pulses are 2+ on the right side, and 2+ on the left side.  Pulmonary/Chest: Effort normal and breath sounds normal. No respiratory distress. He has no wheezes. He has no rales.  Abdominal: Soft. Normal appearance and bowel sounds are normal. He exhibits no distension and no mass. There is no  tenderness. There is no rebound and no guarding. A hernia (large nontender umbilical reducible 5cm defect) is present.  Genitourinary:  deferred  Musculoskeletal: Normal range of motion. He exhibits no edema.  Left lower back with large well circumscribed fatty tumor, nontender 3.5cm diameter  Lymphadenopathy:    He has no cervical adenopathy.  Neurological: He is alert and oriented to person, place, and time.  CN grossly intact, station and gait intact  Skin: Skin is warm and dry. No rash noted.  Psychiatric: He has a normal mood and affect. His behavior is normal. Judgment and thought content normal.       Assessment & Plan:   Problem List Items Addressed This Visit   Hypertension     Chronic, stable. Continue meds.    Hyperlipidemia     Continue zocor 58m daily. Check FLP when returns fasting.    Relevant Orders      Lipid panel   Panic attacks     Significant improvement on zoloft 1080mdaily and minimal ativan use.  Continue Refilled ativan #30 today.  UDS today.    Relevant Medications      LORazepam (ATIVAN) tablet   Nonischemic cardiomyopathy   Diabetes type 2, controlled      Lab Results  Component Value Date   HGBA1C 6.7* 03/14/2013  pt will return for blood work    Relevant Orders      Hemoglobin A1c      Basic metabolic panel      Microalbumin / creatinine urine ratio   Medicare annual wellness visit, initial - Primary     I have personally reviewed the Medicare Annual Wellness questionnaire and have noted 1. The patient's medical and social history 2. Their use of alcohol, tobacco or illicit drugs 3. Their current medications and supplements 4. The patient's functional ability including ADL's, fall risks, home safety risks and hearing or visual impairment. 5. Diet and physical activity 6. Evidence for depression or mood disorders The patients weight, height, BMI have been recorded in the chart.  Hearing and vision has been addressed. I have made  referrals, counseling and provided education to the patient based review of the above and I have provided the pt with a written personalized care plan for preventive services. See scanned questionairre. Advanced directives discussed: would want GF and sister to be HCproxies.  Reviewed preventative protocols and updated unless pt declined. Pt requests deferral of prostate cancer screening. Discussed colon cancer screening - iFOB sent in.    Umbilical hernia     Persistent large hernia, stable. However, given size will ask for surgical evaluation Pt agrees with plan.    Lipoma     Of 3.5 inches in diameter.  Will continue to monitor.    Abnormal drug screen     Have discussed with patient in past and discussed again today he cannot use illicit drugs along with prescribed controlled substances. Discussed he has had 2 inappropriately positive UDS for MJ, discussed if he has a 3rd positive will stop prescribing ativan.     Other Visit Diagnoses   Leukocytosis, unspecified        Relevant Orders       CBC with Differential    Special screening for malignant neoplasms, colon        Relevant Orders       Fecal occult blood, imunochemical    Health maintenance examination            Follow up plan: Return in about 6 months (around 03/06/2014), or as needed, for follow up visit.

## 2013-09-03 NOTE — Assessment & Plan Note (Signed)
Lab Results  Component Value Date   HGBA1C 6.7* 03/14/2013  pt will return for blood work

## 2013-09-03 NOTE — Patient Instructions (Addendum)
Return fasting for blood work one morning this week. Pass by lab for stool kit. We will call you to schedule surgery appointment for hernia. Schedule eye exam as you're due. Return in 6 months for follow up visit, sooner if needed. 

## 2013-09-03 NOTE — Assessment & Plan Note (Signed)
Have discussed with patient in past and discussed again today he cannot use illicit drugs along with prescribed controlled substances. Discussed he has had 2 inappropriately positive UDS for MJ, discussed if he has a 3rd positive will stop prescribing ativan.

## 2013-09-03 NOTE — Assessment & Plan Note (Signed)
Significant improvement on zoloft 100mg  daily and minimal ativan use.  Continue Refilled ativan #30 today.  UDS today.

## 2013-09-03 NOTE — Assessment & Plan Note (Signed)
I have personally reviewed the Medicare Annual Wellness questionnaire and have noted 1. The patient's medical and social history 2. Their use of alcohol, tobacco or illicit drugs 3. Their current medications and supplements 4. The patient's functional ability including ADL's, fall risks, home safety risks and hearing or visual impairment. 5. Diet and physical activity 6. Evidence for depression or mood disorders The patients weight, height, BMI have been recorded in the chart.  Hearing and vision has been addressed. I have made referrals, counseling and provided education to the patient based review of the above and I have provided the pt with a written personalized care plan for preventive services. See scanned questionairre. Advanced directives discussed: would want GF and sister to be HCproxies.  Reviewed preventative protocols and updated unless pt declined. Pt requests deferral of prostate cancer screening. Discussed colon cancer screening - iFOB sent in.

## 2013-09-03 NOTE — Assessment & Plan Note (Signed)
Of 3.5 inches in diameter.  Will continue to monitor.

## 2013-09-03 NOTE — Assessment & Plan Note (Signed)
Chronic, stable. Continue meds. 

## 2013-09-07 ENCOUNTER — Other Ambulatory Visit (INDEPENDENT_AMBULATORY_CARE_PROVIDER_SITE_OTHER): Payer: Medicare Other

## 2013-09-07 DIAGNOSIS — E119 Type 2 diabetes mellitus without complications: Secondary | ICD-10-CM

## 2013-09-07 DIAGNOSIS — D72829 Elevated white blood cell count, unspecified: Secondary | ICD-10-CM

## 2013-09-07 DIAGNOSIS — E785 Hyperlipidemia, unspecified: Secondary | ICD-10-CM

## 2013-09-07 DIAGNOSIS — R5381 Other malaise: Secondary | ICD-10-CM

## 2013-09-07 DIAGNOSIS — Z125 Encounter for screening for malignant neoplasm of prostate: Secondary | ICD-10-CM

## 2013-09-07 LAB — CBC WITH DIFFERENTIAL/PLATELET
Basophils Absolute: 0 10*3/uL (ref 0.0–0.1)
Basophils Relative: 0.3 % (ref 0.0–3.0)
Eosinophils Absolute: 0.3 10*3/uL (ref 0.0–0.7)
Eosinophils Relative: 2.7 % (ref 0.0–5.0)
HEMATOCRIT: 44.1 % (ref 39.0–52.0)
Hemoglobin: 15.1 g/dL (ref 13.0–17.0)
LYMPHS PCT: 29.5 % (ref 12.0–46.0)
Lymphs Abs: 2.9 10*3/uL (ref 0.7–4.0)
MCHC: 34.3 g/dL (ref 30.0–36.0)
MCV: 92.3 fl (ref 78.0–100.0)
MONOS PCT: 8.7 % (ref 3.0–12.0)
Monocytes Absolute: 0.8 10*3/uL (ref 0.1–1.0)
NEUTROS ABS: 5.7 10*3/uL (ref 1.4–7.7)
Neutrophils Relative %: 58.8 % (ref 43.0–77.0)
Platelets: 231 10*3/uL (ref 150.0–400.0)
RBC: 4.78 Mil/uL (ref 4.22–5.81)
RDW: 14 % (ref 11.5–15.5)
WBC: 9.7 10*3/uL (ref 4.0–10.5)

## 2013-09-07 LAB — BASIC METABOLIC PANEL
BUN: 10 mg/dL (ref 6–23)
CHLORIDE: 100 meq/L (ref 96–112)
CO2: 31 meq/L (ref 19–32)
CREATININE: 0.7 mg/dL (ref 0.4–1.5)
Calcium: 9.3 mg/dL (ref 8.4–10.5)
GFR: 131.77 mL/min (ref >60.00)
Glucose, Bld: 158 mg/dL — ABNORMAL HIGH (ref 70–99)
Potassium: 4.9 mEq/L (ref 3.5–5.1)
Sodium: 138 mEq/L (ref 135–145)

## 2013-09-07 LAB — LIPID PANEL
CHOL/HDL RATIO: 6
Cholesterol: 239 mg/dL — ABNORMAL HIGH (ref 0–200)
HDL: 38.7 mg/dL — ABNORMAL LOW (ref >39.00)
LDL Cholesterol: 90 mg/dL (ref 0–99)
TRIGLYCERIDES: 551 mg/dL — AB (ref 0.0–149.0)
VLDL: 110.2 mg/dL — ABNORMAL HIGH (ref 0.0–40.0)

## 2013-09-07 LAB — HEMOGLOBIN A1C: Hgb A1c MFr Bld: 6.9 % — ABNORMAL HIGH (ref 4.6–6.5)

## 2013-09-07 LAB — LACTATE DEHYDROGENASE: LDH: 158 U/L (ref 94–250)

## 2013-09-07 LAB — PSA: PSA: 0.7 ng/mL (ref 0.10–4.00)

## 2013-09-07 LAB — MICROALBUMIN / CREATININE URINE RATIO
Creatinine,U: 136.6 mg/dL
Microalb Creat Ratio: 0.4 mg/g (ref 0.0–30.0)
Microalb, Ur: 0.6 mg/dL (ref 0.0–1.9)

## 2013-09-09 ENCOUNTER — Other Ambulatory Visit: Payer: Self-pay | Admitting: Family Medicine

## 2013-09-09 ENCOUNTER — Encounter: Payer: Self-pay | Admitting: Family Medicine

## 2013-09-09 DIAGNOSIS — E785 Hyperlipidemia, unspecified: Secondary | ICD-10-CM

## 2013-09-13 ENCOUNTER — Encounter: Payer: Self-pay | Admitting: Family Medicine

## 2013-09-18 ENCOUNTER — Ambulatory Visit (INDEPENDENT_AMBULATORY_CARE_PROVIDER_SITE_OTHER): Payer: Medicare Other | Admitting: Surgery

## 2013-09-19 ENCOUNTER — Encounter: Payer: Self-pay | Admitting: Family Medicine

## 2013-09-19 ENCOUNTER — Other Ambulatory Visit: Payer: Medicare Other

## 2013-09-20 ENCOUNTER — Telehealth: Payer: Self-pay

## 2013-09-20 NOTE — Telephone Encounter (Signed)
Mavis NP with [employer] saw pt on 09/15/13 for annual visit; pts had 4+ glucose on urine dip; Mavis did not do finger stick but pt had checked FBS was 112. Mavis NP request to see how Dr Danise Mina can help pt manage diabetes and sugar flowing into urine.  Mavis does not want cb but would like pt contacted with Dr Gutierrez's advice of what to do.

## 2013-09-26 ENCOUNTER — Encounter: Payer: Self-pay | Admitting: Family Medicine

## 2013-10-01 ENCOUNTER — Encounter (INDEPENDENT_AMBULATORY_CARE_PROVIDER_SITE_OTHER): Payer: Self-pay | Admitting: General Surgery

## 2013-10-01 ENCOUNTER — Encounter (INDEPENDENT_AMBULATORY_CARE_PROVIDER_SITE_OTHER): Payer: Self-pay | Admitting: Surgery

## 2013-10-01 ENCOUNTER — Ambulatory Visit (INDEPENDENT_AMBULATORY_CARE_PROVIDER_SITE_OTHER): Payer: Medicare Other | Admitting: Surgery

## 2013-10-01 VITALS — BP 125/75 | HR 79 | Temp 98.6°F | Resp 16 | Ht 72.0 in | Wt 269.8 lb

## 2013-10-01 DIAGNOSIS — K429 Umbilical hernia without obstruction or gangrene: Secondary | ICD-10-CM

## 2013-10-01 NOTE — Progress Notes (Signed)
Patient ID: [name], male   DOB: [birth year], 58 y.o.   MRN: [id number]  Chief Complaint  Patient presents with  . Umbilical Hernia    HPI [name] is a 58 y.o. male.   HPI This is a pleasant gentleman referred to me by Dr. Danise Mina for evaluation of a large umbilical hernia. The patient has had the hernia for many years. It is minimally symptomatic but getting quite larger. He has no obstructive symptoms. Past Medical History  Diagnosis Date  . Obesity   . Hypertension   . Hyperlipidemia   . OSA (obstructive sleep apnea)     does not use CPAP - unable to tolerate  . Anxiety   . Nonischemic cardiomyopathy 2013    EF 25% per Dr Nehemiah Massed  . Angina   . History of atrial fibrillation 2013    chronic, s/p ablation prior on coumadin  . Diabetes type 2, uncontrolled   . Arthritis   . Frequent headaches   . Seasonal allergies   . CAD (coronary artery disease)     nonobstructive  . Migraine   . Ex-smoker   . Cannabis abuse   . Dilated cardiomyopathy 2013    now improved  . Chronic systolic CHF (congestive heart failure) 2013    NYHA Class II/III  . Abnormal drug screen 2015    MJ positive x3 - one more and we will stop prescribing ativan (08/2013).  . COPD (chronic obstructive pulmonary disease)     Past Surgical History  Procedure Laterality Date  . Cardiac electrophysiology mapping and ablation  2013    for atrial flutter  . US echocardiography  2014    EF 50%, nl LV fxn, RV nl size/function, mild mitral insuff  . Cardiovascular stress test  03/2012    ETT WNL Nehemiah Massed)    Family History  Problem Relation Age of Onset  . Hypertension Maternal Grandfather   . Diabetes Other     grandparents  . CAD Maternal Grandfather 77    MI  . Cancer Brother 102    lung (smoker)  . Heart failure Father 22  . Cancer Mother 70    NHL    Social History History  Substance Use Topics  . Smoking status: Former Smoker -- 1.00 packs/day for 12 years    Types:  Cigarettes    Start date: 04/26/1968    Quit date: 04/26/2012  . Smokeless tobacco: Never Used  . Alcohol Use: No    Allergies  Allergen Reactions  . Atorvastatin Other (See Comments)    Dizziness  . Lisinopril Cough    Current Outpatient Prescriptions  Medication Sig Dispense Refill  . ACCU-CHEK FASTCLIX LANCETS MISC Check blood sugar once daily and as instructed. Dx 250.00  100 each  3  . aspirin EC 81 MG tablet Take 81 mg by mouth daily.      . Blood Glucose Monitoring Suppl (ACCU-CHEK NANO SMARTVIEW) W/DEVICE KIT Ck blood sugar once daily and as directed. Dx 250.00  1 kit  0  . carvedilol (COREG) 6.25 MG tablet Take 1 tablet (6.25 mg total) by mouth 2 (two) times daily with a meal.  180 tablet  3  . fluticasone (FLONASE) 50 MCG/ACT nasal spray Place 2 sprays into both nostrils daily.  16 g  3  . furosemide (LASIX) 20 MG tablet Take 1 tablet (20 mg total) by mouth 2 (two) times daily as needed.  90 tablet  3  . glucose blood (ACCU-CHEK SMARTVIEW)  test strip Ck blood sugar once a day and as instructed.Dx 250.00  100 each  3  . LORazepam (ATIVAN) 0.5 MG tablet Take 1 tablet (0.5 mg total) by mouth 2 (two) times daily as needed for anxiety (panic attacks).  30 tablet  0  . metFORMIN (GLUCOPHAGE) 1000 MG tablet Take 1 tablet (1,000 mg total) by mouth 2 (two) times daily with a meal.  180 tablet  3  . potassium chloride (K-DUR) 10 MEQ tablet Take by mouth.      . potassium chloride (K-DUR,KLOR-CON) 10 MEQ tablet Take 1 tablet (10 mEq total) by mouth daily.  90 tablet  3  . sertraline (ZOLOFT) 100 MG tablet Take 1 tablet (100 mg total) by mouth daily.  30 tablet  6  . simvastatin (ZOCOR) 20 MG tablet Take 1 tablet (20 mg total) by mouth every evening.  30 tablet  11   No current facility-administered medications for this visit.    Review of Systems Review of Systems  Constitutional: Negative for fever, chills and unexpected weight change.  HENT: Negative for congestion, hearing loss,  sore throat, trouble swallowing and voice change.   Eyes: Negative for visual disturbance.  Respiratory: Negative for cough and wheezing.   Cardiovascular: Negative for chest pain, palpitations and leg swelling.  Gastrointestinal: Negative for nausea, vomiting, abdominal pain, diarrhea, constipation, blood in stool, abdominal distention, anal bleeding and rectal pain.  Genitourinary: Negative for hematuria and difficulty urinating.  Musculoskeletal: Negative for arthralgias.  Skin: Negative for rash and wound.  Neurological: Negative for seizures, syncope, weakness and headaches.  Hematological: Negative for adenopathy. Does not bruise/bleed easily.  Psychiatric/Behavioral: Negative for confusion.    Blood pressure 125/75, pulse 79, temperature 98.6 F (37 C), resp. rate 16, height 6' (1.829 m), weight 269 lb 12.8 oz (122.38 kg).  Physical Exam Physical Exam  Constitutional: He is oriented to person, place, and time. He appears well-developed and well-nourished. No distress.  HENT:  Head: Normocephalic and atraumatic.  Right Ear: External ear normal.  Left Ear: External ear normal.  Nose: Nose normal.  Mouth/Throat: Oropharynx is clear and moist. No oropharyngeal exudate.  Eyes: Conjunctivae are normal. Pupils are equal, round, and reactive to light. Right eye exhibits no discharge. Left eye exhibits no discharge. No scleral icterus.  Neck: Normal range of motion. Neck supple. No tracheal deviation present. No thyromegaly present.  Cardiovascular: Normal rate, regular rhythm, normal heart sounds and intact distal pulses.   No murmur heard. Pulmonary/Chest: Effort normal and breath sounds normal. No respiratory distress. He has no wheezes.  Abdominal: Soft. Bowel sounds are normal.  There is a large, nontender, somewhat difficult to reduce umbilical hernia  Musculoskeletal: Normal range of motion. He exhibits no edema and no tenderness.  Lymphadenopathy:    He has no cervical  adenopathy.  Neurological: He is alert and oriented to person, place, and time.  Skin: Skin is warm and dry. No rash noted. He is not diaphoretic. No erythema.  Psychiatric: His behavior is normal. Judgment normal.    Data Reviewed   Assessment    Umbilical hernia     Plan    As this is quite large, umbilical hernia repair with mesh was recommended. I discussed this with him in detail. He will need cardiac clearance preoperatively. We will arrange this before scheduling surgery.        Harl Bowie 10/01/2013, 3:43 PM

## 2013-10-04 ENCOUNTER — Telehealth: Payer: Self-pay

## 2013-10-04 NOTE — Telephone Encounter (Signed)
Relevant patient education assigned to patient using Emmi. ° °

## 2013-10-25 DIAGNOSIS — E1169 Type 2 diabetes mellitus with other specified complication: Secondary | ICD-10-CM | POA: Insufficient documentation

## 2013-10-25 DIAGNOSIS — E119 Type 2 diabetes mellitus without complications: Secondary | ICD-10-CM

## 2013-10-25 DIAGNOSIS — E1165 Type 2 diabetes mellitus with hyperglycemia: Secondary | ICD-10-CM | POA: Insufficient documentation

## 2013-11-11 NOTE — Telephone Encounter (Signed)
Would suggest recheck at next office visit.

## 2013-12-26 ENCOUNTER — Other Ambulatory Visit: Payer: Self-pay | Admitting: Family Medicine

## 2013-12-26 ENCOUNTER — Telehealth: Payer: Self-pay

## 2013-12-26 DIAGNOSIS — Z1211 Encounter for screening for malignant neoplasm of colon: Secondary | ICD-10-CM

## 2013-12-26 DIAGNOSIS — E119 Type 2 diabetes mellitus without complications: Secondary | ICD-10-CM

## 2013-12-26 NOTE — Telephone Encounter (Signed)
Spoke with pt about having eyes examined and sending in a stool kit.  Pt is interested in both.

## 2013-12-26 NOTE — Progress Notes (Signed)
Requested referral to DM eye exam and rpt iFOB to close care gaps. plz mail iFOB to patient.

## 2013-12-26 NOTE — Progress Notes (Signed)
iFOB mailed.

## 2014-01-31 ENCOUNTER — Telehealth: Payer: Self-pay

## 2014-01-31 NOTE — Telephone Encounter (Signed)
Left a message for call back.  Called patient regarding diabetic eye exam.  When patient calls back please ask:  Have you had a recent (2014-2015) eye exam?    Date of Exam?  Where?    

## 2014-03-04 ENCOUNTER — Other Ambulatory Visit: Payer: Self-pay | Admitting: *Deleted

## 2014-03-04 MED ORDER — SIMVASTATIN 20 MG PO TABS: 20.0000 mg | ORAL_TABLET | Freq: Every evening | ORAL | Status: DC

## 2014-03-04 MED ORDER — SERTRALINE HCL 100 MG PO TABS: 100.0000 mg | ORAL_TABLET | Freq: Every day | ORAL | Status: DC

## 2014-03-06 ENCOUNTER — Ambulatory Visit: Payer: Medicare Other | Admitting: Family Medicine

## 2014-03-25 NOTE — Telephone Encounter (Signed)
No call back from patient.  Encounter closed.   

## 2014-04-04 ENCOUNTER — Encounter (HOSPITAL_COMMUNITY): Payer: Self-pay | Admitting: Internal Medicine

## 2014-04-20 ENCOUNTER — Other Ambulatory Visit: Payer: Self-pay | Admitting: Family Medicine

## 2014-04-21 ENCOUNTER — Other Ambulatory Visit: Payer: Self-pay | Admitting: Family Medicine

## 2014-05-09 ENCOUNTER — Encounter (HOSPITAL_COMMUNITY): Payer: Self-pay | Admitting: Internal Medicine

## 2014-05-10 DIAGNOSIS — E119 Type 2 diabetes mellitus without complications: Secondary | ICD-10-CM | POA: Diagnosis not present

## 2014-05-13 LAB — HM DIABETES EYE EXAM

## 2014-05-19 ENCOUNTER — Encounter: Payer: Self-pay | Admitting: Family Medicine

## 2014-05-28 ENCOUNTER — Other Ambulatory Visit: Payer: Self-pay | Admitting: Family Medicine

## 2014-05-28 DIAGNOSIS — I4891 Unspecified atrial fibrillation: Secondary | ICD-10-CM | POA: Diagnosis not present

## 2014-05-28 DIAGNOSIS — E782 Mixed hyperlipidemia: Secondary | ICD-10-CM | POA: Diagnosis not present

## 2014-05-28 DIAGNOSIS — I420 Dilated cardiomyopathy: Secondary | ICD-10-CM | POA: Diagnosis not present

## 2014-05-28 DIAGNOSIS — R079 Chest pain, unspecified: Secondary | ICD-10-CM | POA: Diagnosis not present

## 2014-05-29 ENCOUNTER — Ambulatory Visit (INDEPENDENT_AMBULATORY_CARE_PROVIDER_SITE_OTHER): Payer: Medicare Other | Admitting: Family Medicine

## 2014-05-29 ENCOUNTER — Encounter: Payer: Self-pay | Admitting: Family Medicine

## 2014-05-29 VITALS — BP 130/86 | HR 72 | Temp 98.1°F | Wt 276.0 lb

## 2014-05-29 DIAGNOSIS — E119 Type 2 diabetes mellitus without complications: Secondary | ICD-10-CM

## 2014-05-29 DIAGNOSIS — Z23 Encounter for immunization: Secondary | ICD-10-CM

## 2014-05-29 DIAGNOSIS — J019 Acute sinusitis, unspecified: Secondary | ICD-10-CM

## 2014-05-29 DIAGNOSIS — E785 Hyperlipidemia, unspecified: Secondary | ICD-10-CM

## 2014-05-29 LAB — BASIC METABOLIC PANEL
BUN: 9 mg/dL (ref 6–23)
CALCIUM: 9.7 mg/dL (ref 8.4–10.5)
CO2: 30 mEq/L (ref 19–32)
Chloride: 103 mEq/L (ref 96–112)
Creatinine, Ser: 0.7 mg/dL (ref 0.40–1.50)
GFR: 122.81 mL/min (ref >60.00)
GLUCOSE: 175 mg/dL — AB (ref 70–99)
Potassium: 4.3 mEq/L (ref 3.5–5.1)
Sodium: 137 mEq/L (ref 135–145)

## 2014-05-29 LAB — LDL CHOLESTEROL, DIRECT: Direct LDL: 92 mg/dL

## 2014-05-29 LAB — HEMOGLOBIN A1C: Hgb A1c MFr Bld: 7.2 % — ABNORMAL HIGH (ref 4.6–6.5)

## 2014-05-29 MED ORDER — DOXYCYCLINE HYCLATE 100 MG PO CAPS: 100.0000 mg | ORAL_CAPSULE | Freq: Two times a day (BID) | ORAL | Status: DC

## 2014-05-29 NOTE — Progress Notes (Signed)
BP 130/86 mmHg  Pulse 72  Temp(Src) 98.1 F (36.7 C) (Oral)  Wt 276 lb (125.193 kg)  SpO2 96%   CC: chest congestion, increased gas  Subjective:    Patient ID: [name], male    DOB: [birth year], 59 y.o.   MRN: [id number]  HPI: [name] is a 59 y.o. male presenting on 05/29/2014 for Chest congestion and Gas   1 mo h/o chest congestion. Cough started around Christmas, this improved with time but never fully resolved. Noticing increased gassiness leading to dyspnea. Intermittent issue. Notices more in am when he awakens. + head congestion with some frontal sinus pressure headache.   No fevers/chills, cough has resolved, no ear or tooth pain, PNDrainage, ST.  Saw cardiologist Nehemiah Massed yesterday - good report. Told not related to heart.   H/o allergic rhinitis and dx COPD in chart but pt denies chronic dyspnea. Ex smoker - quit 04/2012.  H/o chronic sCHF.  No smokers at home.   DM - off metformin for last year - caused nausea. Tolerated lower dose better. Not fasting today. Lab Results  Component Value Date   HGBA1C 6.9* 09/07/2013    Relevant past medical, surgical, family and social history reviewed and updated as indicated. Interim medical history since our last visit reviewed. Allergies and medications reviewed and updated. Current Outpatient Prescriptions on File Prior to Visit  Medication Sig  . ACCU-CHEK FASTCLIX LANCETS MISC Check blood sugar once daily and as instructed. Dx 250.00  . aspirin EC 81 MG tablet Take 81 mg by mouth daily.  . Blood Glucose Monitoring Suppl (ACCU-CHEK NANO SMARTVIEW) W/DEVICE KIT Ck blood sugar once daily and as directed. Dx 250.00  . carvedilol (COREG) 6.25 MG tablet Take 1 tablet (6.25 mg total) by mouth 2 (two) times daily with a meal.  . fluticasone (FLONASE) 50 MCG/ACT nasal spray Place 2 sprays into both nostrils daily.  . furosemide (LASIX) 20 MG tablet TAKE 1 TABLET (20 MG TOTAL) BY MOUTH 2 (TWO) TIMES DAILY AS NEEDED.  (Patient taking differently: TAKE 1 TABLET (20 MG TOTAL) BY MOUTH ONCE DAILY)  . glucose blood (ACCU-CHEK SMARTVIEW) test strip Ck blood sugar once a day and as instructed.Dx 250.00  . LORazepam (ATIVAN) 0.5 MG tablet Take 1 tablet (0.5 mg total) by mouth 2 (two) times daily as needed for anxiety (panic attacks).  . sertraline (ZOLOFT) 100 MG tablet Take 1 tablet (100 mg total) by mouth daily.  . simvastatin (ZOCOR) 20 MG tablet Take 1 tablet (20 mg total) by mouth every evening.  Marland Kitchen KLOR-CON 10 10 MEQ tablet TAKE 1 TABLET (10 MEQ TOTAL) BY MOUTH DAILY. (Patient not taking: Reported on 05/29/2014)  . metFORMIN (GLUCOPHAGE) 1000 MG tablet TAKE 1 TABLET BY MOUTH TWICE A DAY WITH A MEAL (Patient not taking: Reported on 05/29/2014)   No current facility-administered medications on file prior to visit.    Review of Systems Per HPI unless specifically indicated above     Objective:    BP 130/86 mmHg  Pulse 72  Temp(Src) 98.1 F (36.7 C) (Oral)  Wt 276 lb (125.193 kg)  SpO2 96%  Wt Readings from Last 3 Encounters:  05/29/14 276 lb (125.193 kg)  10/01/13 269 lb 12.8 oz (122.38 kg)  09/03/13 290 lb (131.543 kg)    Physical Exam  Constitutional: He appears well-developed and well-nourished. No distress.  obese  HENT:  Head: Normocephalic and atraumatic.  Right Ear: Hearing, tympanic membrane, external ear and ear canal normal.  Left Ear: Hearing, tympanic membrane, external ear and ear canal normal.  Nose: Mucosal edema (injected nares) present. No rhinorrhea. Right sinus exhibits no maxillary sinus tenderness and no frontal sinus tenderness. Left sinus exhibits no maxillary sinus tenderness and no frontal sinus tenderness.  Mouth/Throat: Uvula is midline, oropharynx is clear and moist and mucous membranes are normal. No oropharyngeal exudate, posterior oropharyngeal edema, posterior oropharyngeal erythema or tonsillar abscesses.  Nasal mucosal congestion  Eyes: Conjunctivae and EOM are  normal. Pupils are equal, round, and reactive to light. No scleral icterus.  Neck: Normal range of motion. Neck supple.  Cardiovascular: Normal rate, regular rhythm, normal heart sounds and intact distal pulses.   No murmur heard. Pulmonary/Chest: Effort normal and breath sounds normal. No respiratory distress. He has no wheezes. He has no rales.  Regular today  Lymphadenopathy:    He has no cervical adenopathy.  Skin: Skin is warm and dry. No rash noted.  Nursing note and vitals reviewed.  Results for orders placed or performed in visit on 05/19/14  HM DIABETES EYE EXAM  Result Value Ref Range   HM Diabetic Eye Exam No Retinopathy No Retinopathy      Assessment & Plan:   Problem List Items Addressed This Visit    Diabetes type 2, controlled    Lab Results  Component Value Date   HGBA1C 6.9* 09/07/2013  due for labwork - recheck today. Pt off metformin - high dose caused nausea. If A1c <7, will need to restart metformin or another antihyperglycemic.      Relevant Orders   LDL Cholesterol, Direct   Hemoglobin Q1J   Basic metabolic panel   Acute sinusitis - Primary    Given progression and duration of sxs and comorbidities will treat with doxycycline course 10 days. Update if not improving as expected. Cardiologist gave good report yesterday, lungs clear today See pt instructions for further supportive care.      Relevant Medications   doxy 10d       Follow up plan: Return in about 4 months (around 09/27/2014), or if symptoms worsen or fail to improve, for medicare wellness.

## 2014-05-29 NOTE — Addendum Note (Signed)
Addended by: Royann Shivers A on: 05/29/2014 10:22 AM   Modules accepted: Orders

## 2014-05-29 NOTE — Progress Notes (Signed)
Pre visit review using our clinic review tool, if applicable. No additional management support is needed unless otherwise documented below in the visit note. 

## 2014-05-29 NOTE — Patient Instructions (Addendum)
Flu shot today Blood work today. Let's treat possible bronchitis with antibiotic - take doxycycline 10 days.  Push fluids and rest. May use plain mucinex or immediate release guaifenesin with plenty of water to help mobilize mucous. Check with pharmacist to ensure no decongestant. Let us know if not improving Return in 4 months for medicare wellness

## 2014-05-29 NOTE — Assessment & Plan Note (Addendum)
Given progression and duration of sxs and comorbidities will treat with doxycycline course 10 days. Update if not improving as expected. Cardiologist gave good report yesterday, lungs clear today See pt instructions for further supportive care.

## 2014-05-29 NOTE — Assessment & Plan Note (Signed)
Lab Results  Component Value Date   HGBA1C 6.9* 09/07/2013  due for labwork - recheck today. Pt off metformin - high dose caused nausea. If A1c <7, will need to restart metformin or another antihyperglycemic.

## 2014-06-03 ENCOUNTER — Other Ambulatory Visit: Payer: Self-pay | Admitting: Family Medicine

## 2014-06-03 MED ORDER — METFORMIN HCL 500 MG PO TABS: ORAL_TABLET | ORAL | Status: DC

## 2014-07-30 ENCOUNTER — Other Ambulatory Visit: Payer: Self-pay | Admitting: Family Medicine

## 2014-08-16 NOTE — Discharge Summary (Signed)
PATIENT NAME:  [name], [name] MR#:  [id number] DATE OF BIRTH:  [birth year]  For a detailed note, please take a look at the history and physical done on admission by Dr. Bridgett Larsson.   DIAGNOSES AT DISCHARGE:  1.  Chest pain, likely related to underlying chronic obstructive pulmonary disease.  2.  Chronic obstructive pulmonary disease with history of a long-term tobacco abuse.  3.  Cardiomyopathy, ejection fraction of 25%, history of atrial fibrillation.     DIET: The patient is being discharged on a low-sodium, low-fat diet.   ACTIVITY: As tolerated.   FOLLOW-UP: Dr. Serafina Royals in the next 1 to 2 weeks. The patient is to get himself a primary care physician.   DISCHARGE MEDICATIONS: Lisinopril 10 mg daily, atorvastatin 20 mg daily, aspirin 324 mg daily and Coreg 6.25 mg b.i.d.   CONSULTANTS DURING HOSPITAL COURSE: Dr. Miquel Dunn from cardiology.   PERTINENT STUDIES DONE DURING THE HOSPITAL COURSE: Are as follows: A chest x-ray done on admission showing findings consistent with COPD. No evidence of pneumonia or CHF. A nuclear medicine stress test done on October 1 showing no significant wall-motion abnormalities. Myocardial perfusion study with no significant ischemia, EF of 65%. No EKG changes concerning for ischemia.   HOSPITAL COURSE: This is a 59 year old male who presented to the hospital with chest pain.  1.  Chest pain. The patient does have significant risk factors given his long history of tobacco abuse, previous history of chronic atrial fibrillation. He was therefore observed overnight on telemetry, had 3 sets of cardiac markers checked which were negative. He underwent a nuclear medicine stress test which showed no evidence of acute myocardial ischemia. The most likely cause of his chest pain is either musculoskeletal or possibly related to his underlying chronic obstructive pulmonary disease. Currently he is chest pain-free and hemodynamically stable and therefore is being  discharged home with close follow-up with his cardiologist, Dr. Nehemiah Massed, as an outpatient. The patient was not taking any meds prior to coming in, but given his history of severe cardiomyopathy with ejection fraction of 25%, the patient is currently being discharged on Coreg and lisinopril and aspirin and statin as stated.  2.  Hyperlipidemia. The patient's total cholesterol was as high as 200. His triglycerides are 350.  His LDL is 98. He is being discharged on a low-dose statin.  3.  Cardiomyopathy, ejection fraction of 25%. This was based on an echocardiogram done in 2012, although his ejection fraction on his nuclear stress test is 55% to 65%. He is not currently taking any medications, although I did discharge him on low-dose Coreg and lisinopril to have close follow-up with his cardiologist as an outpatient.   He clinically is not in congestive heart failure.   CODE STATUS: FULL CODE.   TIME SPENT ON DISCHARGE: 40 minutes.    ____________________________ Belia Heman. Verdell Carmine, MD vjs:np D: 01/24/2013 15:26:19 ET T: 01/24/2013 19:26:47 ET JOB#: [id number]  cc: Belia Heman. Verdell Carmine, MD, <Dictator> Corey Skains, MD Henreitta Leber MD ELECTRONICALLY SIGNED 01/31/2013 19:14

## 2014-08-16 NOTE — H&P (Signed)
PATIENT NAME:  [name], [name] MR#:  [id number] DATE OF BIRTH:  [birth year]  DATE OF ADMISSION:  01/23/2013  PRIMARY CARE PHYSICIAN: Dr. Netty Starring, but the patient does not want to see him again.  REFERRING PHYSICIAN: Hinda Kehr, MD  CHIEF COMPLAINT: Chest pain for 1 week.   HISTORY OF PRESENT ILLNESS: The patient is a 59 year old Caucasian male with a history of dilated cardiomyopathy with ejection fraction of 25%, OSA and chronic A-fib status post ablation last year who presented to the ED with chest pain for the past 1 week. The patient is alert, awake and oriented, in no acute distress. He has had a cough for the past 1 week and also has sputum and shortness of breath. In addition, the patient has chest pain in the substernal area which is pressure-like, intermittent, no radiation, exacerbated by cough. The patient denies any diaphoresis. No palpitations, orthopnea or nocturnal dyspnea. No weight gain, but has lost weight on purpose. He denies any nausea, vomiting or diarrhea.   PAST MEDICAL HISTORY:  1.  Cardiomyopathy with ejection fraction 25%. 2.  Chronic A-fib status post ablation. Was on Coumadin but off Coumadin 3 months after ablation. 3.  Obstructive sleep apnea.  4.  Nonobstructive CAD.  5.  GERD.  PAST SURGICAL HISTORY: None.  SOCIAL HISTORY: The patient quit smoking 2-1/2 weeks ago. Has been smoking 1 pack a day for 10 years. Denies any alcohol drinking or illicit drugs.   FAMILY HISTORY: Significant for heart disease and Hodgkin lymphoma.   REVIEW OF SYSTEMS: CONSTITUTIONAL: The patient denies any fever or chills. No headache or dizziness. No weakness.  EYES: No double vision, blurred vision. ENT: No postnasal drip, slurred speech or dysphagia. CARDIOVASCULAR: Positive for chest pain, pressure like. No palpitations, orthopnea or nocturnal dyspnea. No leg edema.  PULMONARY: Has cough, sputum and shortness of breath, but no hemoptysis.  GASTROINTESTINAL: No abdominal  pain, nausea, vomiting or diarrhea. No melena or bloody stool. GENITOURINARY: No dysuria, hematuria or incontinence.  SKIN: No rash or jaundice.  NEUROLOGIC: No syncope, loss of consciousness or seizure.  HEMATOLOGY: No easy bruising or bleeding.  ENDOCRINE: No polyuria, polydipsia, heat or cold intolerance.   ALLERGIES: No known drug allergies.  HOME MEDICATIONS: None. The patient is not taking any medication since last May.   PHYSICAL EXAMINATION: VITAL SIGNS: Temperature 98.1, blood pressure 152/92, pulse 67, O2 saturation 96% on oxygen.  GENERAL: The patient is alert, awake and oriented, in no acute distress.  HEENT: Pupils round, equal and reactive to light and accommodation. Moist oral mucosa. Clear oropharynx.  NECK: Supple. No JVD or carotid bruits. No lymphadenopathy. No thyromegaly.  HEART: S1 and S2 regular rate and rhythm. No murmurs or gallops. LUNGS: Bilateral air entry. No wheezing or rales. No use of accessory muscle to breathe.  ABDOMEN: Soft, obese. Bowel sounds present. No tenderness or distention. No organomegaly.  EXTREMITIES: No edema, clubbing or cyanosis. No calf tenderness. Strong bilateral pedal pulses.   SKIN: No rash or jaundice.  NEUROLOGY: Alert and oriented x 3. No focal deficit. Power 5 out of 5. Sensation intact.  LABORATORY AND DIAGNOSTICS: Troponin less than 0.02, twice.   Chest x-ray show findings consistent with COPD and no pneumonia or CHF.  Glucose 141, BUN 9, creatinine 0.65, sodium 135, potassium 4.2, chloride 102, bicarb 29. WBC 11.1, hemoglobin 17, platelets 202. Lipase 88.   EKG showed normal sinus rhythm at 67 bpm.   IMPRESSIONS: 1.  Atypical chest pain, but need to rule  out acute coronary syndrome.  2.  History of nonobstructive coronary artery disease. 3.  Cardiomyopathy with ejection fraction of 25%.  4.  Questionable diabetes. The patient denies.  5.  Obesity.  6.  History of atrial fibrillation, now normal sinus rhythm.  7.   Obstructive sleep apnea.  PLAN OF TREATMENT: 1.  The patient will be placed for observation. We will continue oxygen by nasal cannula. We will start aspirin 325 mg p.o. daily and start lisinopril, Coreg and statin. We will get a stress test and cardiology consult from Dr. Nehemiah Massed. The patient's cardiologist is Dr. Nehemiah Massed.  2.  For questionable diabetes, we will check Hemoglobin A1c and start sliding scale.   Discussed the patient's condition and plan of treatment with the patient and the patient's wife. The patient wants FULL CODE.   TIME SPENT: About 45 minutes.  ____________________________ Demetrios Loll, MD qc:sb D: 01/23/2013 15:58:58 ET T: 01/23/2013 16:25:57 ET JOB#: [id number]  cc: Demetrios Loll, MD, <Dictator> Demetrios Loll MD ELECTRONICALLY SIGNED 01/25/2013 14:46

## 2014-08-16 NOTE — Consult Note (Signed)
PATIENT NAME:  [name], STAVE MR#:  [id number] DATE OF BIRTH:  Jan 10, 1956  DATE OF CONSULTATION:  01/23/2013  REFERRING PHYSICIAN:  Dr. Bridgett Larsson CONSULTING PHYSICIAN:  Isaias Cowman, MD  CHIEF COMPLAINT:  Left-sided chest pain.   REASON FOR CONSULTATION: Consultation requested for evaluation of chest pain and shortness of breath.   HISTORY OF PRESENT ILLNESS: The patient is a 59 year old gentleman with history of chronic atrial fibrillation status post catheter ablation and dilated cardiomyopathy with ejection fraction reported to be 25%. The patient reports that he was in his usual state of health until this past week when he has had recurrent episodes of chest pain which appears to be exacerbated by cough and shortness of breath. The patient presented to [facility] Room where EKG was nondiagnostic. Admission labs were notable for a negative troponin. The patient has mildly elevated white count of 11,100. Chest x-ray revealed evidence for COPD. The patient reports he quit smoking 2 weeks ago.   PAST MEDICAL HISTORY:  1.  Chronic atrial fibrillation status post catheter ablation by Dr. Virl Axe at [facility]. 2.   Dilated cardiomyopathy with reported LV ejection fraction of 25%.  3.  Obstructive sleep apnea.  4.  Gastroesophageal reflux disease.   MEDICATIONS:  Please see home medication list.   SOCIAL HISTORY: The patient currently lives with his girlfriend. He previously was smoking a pack of cigarettes a day, quit 2 weeks ago.   FAMILY HISTORY: Sister is status post coronary artery bypass graft surgery in her 33s.  REVIEW OF SYSTEMS:  CONSTITUTIONAL: No fever or chills.   EYES: No blurry vision.  EARS: No hearing loss.  RESPIRATORY: Shortness of breath with productive cough as stated above.  CARDIOVASCULAR: Left-sided chest pain as described above.  GASTROINTESTINAL: No nausea, vomiting, diarrhea or constipation.  GENITOURINARY: No dysuria or hematuria.   ENDOCRINE: No polyuria or polydipsia.  MUSCULOSKELETAL: No arthralgias or myalgias.  NEUROLOGICAL: No focal muscle weakness or numbness.  PSYCHOLOGICAL: No depression or anxiety.   PHYSICAL EXAMINATION: VITAL SIGNS: Blood pressure 156/99, pulse 71, respirations 18, temperature 97.9, pulse oximetry 94%.  HEENT: Pupils equal, reactive to light and accommodation.  NECK: Supple without thyromegaly.  LUNGS: Clear.  CARDIOVASCULAR: Normal JVP. Normal PMI. Regular rate and rhythm. Normal S1, S2. No appreciable gallop, murmur or rub.  ABDOMEN: Soft and nontender. Pulses were intact bilaterally.  MUSCULOSKELETAL: Normal muscle tone.  NEUROLOGIC: The patient is alert and oriented x 3. Motor and sensory both grossly intact.   IMPRESSION: A 59 year old gentleman with multiple cardiovascular risk factors with history of chronic atrial fibrillation status post catheter ablation with apparent history of dilated cardiomyopathy with reportedly reduced left ventricular function who presents with chest pain with typical and atypical features with nondiagnostic EKG and negative troponin.   RECOMMENDATIONS: 1.  Agree with overall current therapy.  2.  Would defer full dose anticoagulation at this time  3. Agree with nuclear stress test in the a.m. if patient rules out for myocardial infarction.   ____________________________ Isaias Cowman, MD ap:ce D: 01/23/2013 16:45:35 ET T: 01/23/2013 17:29:17 ET JOB#: [id number]  cc: Isaias Cowman, MD, <Dictator> Isaias Cowman MD ELECTRONICALLY SIGNED 02/05/2013 11:26

## 2014-08-21 ENCOUNTER — Other Ambulatory Visit: Payer: Self-pay | Admitting: Family Medicine

## 2014-08-30 ENCOUNTER — Telehealth: Payer: Self-pay

## 2014-08-30 MED ORDER — GLUCOSE BLOOD VI STRP: 1.0000 | ORAL_STRIP | Status: DC

## 2014-08-30 NOTE — Telephone Encounter (Signed)
Sent in

## 2014-08-30 NOTE — Telephone Encounter (Signed)
Pt left v/m; pt has new one touch verio meter and pt request test strips to [facility].Please advise. Pt last seen 05/29/2014.

## 2014-09-19 ENCOUNTER — Other Ambulatory Visit: Payer: Self-pay | Admitting: Family Medicine

## 2014-09-25 ENCOUNTER — Other Ambulatory Visit: Payer: Self-pay | Admitting: Family Medicine

## 2014-09-25 DIAGNOSIS — I10 Essential (primary) hypertension: Secondary | ICD-10-CM

## 2014-09-25 DIAGNOSIS — E119 Type 2 diabetes mellitus without complications: Secondary | ICD-10-CM

## 2014-09-25 DIAGNOSIS — Z125 Encounter for screening for malignant neoplasm of prostate: Secondary | ICD-10-CM

## 2014-09-25 DIAGNOSIS — E785 Hyperlipidemia, unspecified: Secondary | ICD-10-CM

## 2014-09-26 ENCOUNTER — Other Ambulatory Visit: Payer: Medicare Other

## 2014-10-01 ENCOUNTER — Encounter: Payer: Medicare Other | Admitting: Family Medicine

## 2014-10-09 ENCOUNTER — Other Ambulatory Visit (INDEPENDENT_AMBULATORY_CARE_PROVIDER_SITE_OTHER): Payer: Medicare Other

## 2014-10-09 DIAGNOSIS — E785 Hyperlipidemia, unspecified: Secondary | ICD-10-CM | POA: Diagnosis not present

## 2014-10-09 DIAGNOSIS — I10 Essential (primary) hypertension: Secondary | ICD-10-CM | POA: Diagnosis not present

## 2014-10-09 DIAGNOSIS — E119 Type 2 diabetes mellitus without complications: Secondary | ICD-10-CM | POA: Diagnosis not present

## 2014-10-09 DIAGNOSIS — Z125 Encounter for screening for malignant neoplasm of prostate: Secondary | ICD-10-CM | POA: Diagnosis not present

## 2014-10-09 LAB — LIPID PANEL
CHOL/HDL RATIO: 5
CHOLESTEROL: 181 mg/dL (ref 0–200)
HDL: 33.8 mg/dL — ABNORMAL LOW (ref >39.00)
NonHDL: 147.2
TRIGLYCERIDES: 316 mg/dL — AB (ref 0.0–149.0)
VLDL: 63.2 mg/dL — ABNORMAL HIGH (ref 0.0–40.0)

## 2014-10-09 LAB — BASIC METABOLIC PANEL
BUN: 10 mg/dL (ref 6–23)
CO2: 31 mEq/L (ref 19–32)
CREATININE: 0.7 mg/dL (ref 0.40–1.50)
Calcium: 9.5 mg/dL (ref 8.4–10.5)
Chloride: 102 mEq/L (ref 96–112)
GFR: 122.66 mL/min (ref >60.00)
GLUCOSE: 148 mg/dL — AB (ref 70–99)
Potassium: 5.1 mEq/L (ref 3.5–5.1)
Sodium: 137 mEq/L (ref 135–145)

## 2014-10-09 LAB — PSA: PSA: 1.18 ng/mL (ref 0.10–4.00)

## 2014-10-09 LAB — LDL CHOLESTEROL, DIRECT: Direct LDL: 98 mg/dL

## 2014-10-09 LAB — HEMOGLOBIN A1C: HEMOGLOBIN A1C: 7 % — AB (ref 4.6–6.5)

## 2014-10-16 ENCOUNTER — Ambulatory Visit (INDEPENDENT_AMBULATORY_CARE_PROVIDER_SITE_OTHER): Payer: Medicare Other | Admitting: Family Medicine

## 2014-10-16 ENCOUNTER — Encounter: Payer: Self-pay | Admitting: Family Medicine

## 2014-10-16 VITALS — BP 126/84 | HR 74 | Temp 98.0°F | Ht 70.5 in | Wt 272.1 lb

## 2014-10-16 DIAGNOSIS — Z0000 Encounter for general adult medical examination without abnormal findings: Secondary | ICD-10-CM | POA: Diagnosis not present

## 2014-10-16 DIAGNOSIS — E785 Hyperlipidemia, unspecified: Secondary | ICD-10-CM

## 2014-10-16 DIAGNOSIS — Z1211 Encounter for screening for malignant neoplasm of colon: Secondary | ICD-10-CM

## 2014-10-16 DIAGNOSIS — Z0001 Encounter for general adult medical examination with abnormal findings: Secondary | ICD-10-CM | POA: Insufficient documentation

## 2014-10-16 DIAGNOSIS — Z7189 Other specified counseling: Secondary | ICD-10-CM | POA: Insufficient documentation

## 2014-10-16 MED ORDER — FISH OIL 1000 MG PO CAPS: 2.0000 | ORAL_CAPSULE | Freq: Every day | ORAL | Status: DC

## 2014-10-16 NOTE — Progress Notes (Signed)
Pre visit review using our clinic review tool, if applicable. No additional management support is needed unless otherwise documented below in the visit note. 

## 2014-10-16 NOTE — Assessment & Plan Note (Signed)
Preventative protocols reviewed and updated unless pt declined. Discussed healthy diet and lifestyle.  

## 2014-10-16 NOTE — Assessment & Plan Note (Addendum)
I have personally reviewed the Medicare Annual Wellness questionnaire and have noted 1. The patient's medical and social history 2. Their use of alcohol, tobacco or illicit drugs 3. Their current medications and supplements 4. The patient's functional ability including ADL's, fall risks, home safety risks and hearing or visual impairment. Cognitive function has been assessed and addressed as indicated.  5. Diet and physical activity 6. Evidence for depression or mood disorders The patients weight, height, BMI have been recorded in the chart. I have made referrals, counseling and provided education to the patient based on review of the above and I have provided the pt with a written personalized care plan for preventive services. Provider list updated.. See scanned questionairre as needed for further documentation. Reviewed preventative protocols and updated unless pt declined.   Pt will bring back medicare questionairre to review.

## 2014-10-16 NOTE — Patient Instructions (Addendum)
Pass by lab to pick up new stool kit. Advanced directive packet provided today. Fill out medicare wellness questionairre provided today. You are doing well. Increase fish oil to 2 grams daily. Decrease added sugars, eliminate trans fats, increase fiber and limit alcohol.  All these changes together can drop triglycerides by almost 50%.  Return as needed or in 6 months for diabetes follow up.

## 2014-10-16 NOTE — Progress Notes (Signed)
BP 126/84 mmHg  Pulse 74  Temp(Src) 98 F (36.7 C) (Oral)  Ht 5' 10.5" (1.791 m)  Wt 272 lb 1.9 oz (123.433 kg)  BMI 38.48 kg/m2  SpO2 96%   CC: medicare wellness visit  Subjective:    Patient ID: MESHILEM [name], male    DOB: November 02, 1955, 59 y.o.   MRN: [id number]  HPI: ANSAR SKODA is a 59 y.o. male presenting on 10/16/2014 for Annual Exam   Does not have medicare wellness form today. MJ user. We have stopped prescribing ativan due to this which pt rarely uses (last filled #30 08/2013).   Smoking - 1 ppd.   Hearing and vision screens passed. 2 falls in last year - tripping over dogs. Denies depression/sadness/anhedonia.  Preventative: Colon cancer screening - has been recommended against 2/2 umbilical hernia. Did not return last iFOB. Prostate cancer screening - discussed, declines for now.  Flu shot - 05/2014 Tetanus - thinks 2008 Pneumovax per patient done in hospital 2014 Advanced directives - has discussed with family. No children. HCProxy would be Dawn, girlfriend as well as sister (who lives in [place]). Does not want prolonged life support. Packet provided today.  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 Interim medical history since our last visit reviewed. Allergies and medications reviewed and updated. Current Outpatient Prescriptions on File Prior to Visit  Medication Sig  . ACCU-CHEK FASTCLIX LANCETS MISC Check blood sugar once daily and as instructed. Dx 250.00  . aspirin EC 81 MG tablet Take 81 mg by mouth daily.  . Blood Glucose Monitoring Suppl (BLOOD GLUCOSE MONITOR SYSTEM) W/DEVICE KIT by Does not apply route. Use to check sugar once daily and as needed Dx: E11.9  **ONE TOUCH VERIO**  . carvedilol (COREG) 6.25 MG tablet TAKE 1 TABLET BY MOUTH 2 TIMES DAILY WITH A MEAL.  . fluticasone (FLONASE) 50 MCG/ACT nasal spray Place 2 sprays into both nostrils daily.  . furosemide (LASIX) 20 MG tablet TAKE 1 TABLET (20 MG TOTAL) BY MOUTH 2 (TWO) TIMES DAILY AS NEEDED.  Marland Kitchen glucose blood test strip 1 each by Other route as directed. Use to check sugar once daily and as needed. Dx:E11.9  **ONE TOUCH VERIO**  . KLOR-CON 10 10 MEQ tablet TAKE 1 TABLET (10 MEQ TOTAL) BY MOUTH DAILY.  . metFORMIN (GLUCOPHAGE) 500 MG tablet Take one tablet by mouth every evening with dinner.  . sertraline (ZOLOFT) 100 MG tablet Take 1 tablet (100 mg total) by mouth daily.  . simvastatin (ZOCOR) 20 MG tablet Take 1 tablet (20 mg total) by mouth every evening.   No current facility-administered medications on file prior to visit.    Review of Systems  Constitutional: Negative for fever, chills, activity change, appetite change, fatigue and unexpected weight change.  HENT: Negative for hearing loss.   Eyes: Negative for visual disturbance.  Respiratory: Negative for cough, chest tightness, shortness of breath and wheezing.   Cardiovascular: Negative for chest pain, palpitations and leg swelling.  Gastrointestinal: Positive for nausea. Negative for vomiting, abdominal pain, diarrhea, constipation, blood in stool and abdominal distention.       Gassiness  Genitourinary: Negative for hematuria and difficulty urinating.  Musculoskeletal: Negative for myalgias, arthralgias and neck pain.  Skin: Negative for rash.  Neurological:  Negative for dizziness, seizures, syncope and headaches.  Hematological: Negative for adenopathy. Does not bruise/bleed easily.  Psychiatric/Behavioral: Negative for dysphoric mood. The patient is not nervous/anxious.    Per HPI unless specifically indicated above     Objective:    BP 126/84 mmHg  Pulse 74  Temp(Src) 98 F (36.7 C) (Oral)  Ht 5' 10.5" (1.791  m)  Wt 272 lb 1.9 oz (123.433 kg)  BMI 38.48 kg/m2  SpO2 96%  Wt Readings from Last 3 Encounters:  10/16/14 272 lb 1.9 oz (123.433 kg)  05/29/14 276 lb (125.193 kg)  10/01/13 269 lb 12.8 oz (122.38 kg)    Physical Exam  Constitutional: He is oriented to person, place, and time. He appears well-developed and well-nourished. No distress.  HENT:  Head: Normocephalic and atraumatic.  Right Ear: Hearing, tympanic membrane, external ear and ear canal normal.  Left Ear: Hearing, tympanic membrane, external ear and ear canal normal.  Nose: Nose normal.  Mouth/Throat: Uvula is midline, oropharynx is clear and moist and mucous membranes are normal. No oropharyngeal exudate, posterior oropharyngeal edema or posterior oropharyngeal erythema.  Eyes: Conjunctivae and EOM are normal. Pupils are equal, round, and reactive to light. No scleral icterus.  Neck: Normal range of motion. Neck supple. No thyromegaly present.  Cardiovascular: Normal rate, regular rhythm, normal heart sounds and intact distal pulses.   No murmur heard. Pulses:      Radial pulses are 2+ on the right side, and 2+ on the left side.  Pulmonary/Chest: Effort normal and breath sounds normal. No respiratory distress. He has no wheezes. He has no rales.  Abdominal: Soft. Bowel sounds are normal. He exhibits no distension and no mass. There is no tenderness. There is no rebound and no guarding.  Musculoskeletal: Normal range of motion. He exhibits no edema.  Lymphadenopathy:    He has no cervical adenopathy.  Neurological: He is alert and oriented to person, place, and time.  CN grossly intact, station and gait intact  Skin: Skin is warm and dry. No rash noted.  Psychiatric: He has a normal mood and affect. His behavior is normal. Judgment and thought content normal.  Nursing note and vitals reviewed.  Results for orders placed or performed in visit on 10/09/14  Lipid panel  Result Value Ref Range   Cholesterol 181 0 - 200 mg/dL     Triglycerides 316.0 (H) 0.0 - 149.0 mg/dL   HDL 33.80 (L) >39.00 mg/dL   VLDL 63.2 (H) 0.0 - 40.0 mg/dL   Total CHOL/HDL Ratio 5    NonHDL 147.20   Hemoglobin A1c  Result Value Ref Range   Hgb A1c MFr Bld 7.0 (H) 4.6 - 6.5 %  PSA  Result Value Ref Range   PSA 1.18 0.10 - 4.00 ng/mL  Basic metabolic panel  Result Value Ref Range   Sodium 137 135 - 145 mEq/L   Potassium 5.1 3.5 - 5.1 mEq/L   Chloride 102 96 - 112 mEq/L   CO2 31 19 - 32 mEq/L   Glucose, Bld 148 (H) 70 - 99 mg/dL   BUN 10 6 - 23 mg/dL   Creatinine, Ser 0.70 0.40 - 1.50 mg/dL   Calcium 9.5 8.4 - 10.5 mg/dL   GFR 122.66 >60.00 mL/min  LDL cholesterol, direct  Result Value Ref Range   Direct LDL 98.0 mg/dL      Assessment & Plan:   Problem List Items Addressed This Visit    Advanced care planning/counseling discussion    Advanced  directives - has discussed with family. No children. HCProxy would be Dawn, girlfriend as well as sister (who lives in [place]). Does not want prolonged life support. Packet provided today.      Health maintenance examination    .Preventative protocols reviewed and updated unless pt declined. Discussed healthy diet and lifestyle.       Hyperlipidemia    LDL at goal. Trig too high - increase fish oil to 2 gm daily. Discussed dietary choices to improve trig.      Medicare annual wellness visit, subsequent - Primary    I have personally reviewed the Medicare Annual Wellness questionnaire and have noted 1. The patient's medical and social history 2. Their use of alcohol, tobacco or illicit drugs 3. Their current medications and supplements 4. The patient's functional ability including ADL's, fall risks, home safety risks and hearing or visual impairment. Cognitive function has been assessed and addressed as indicated.  5. Diet and physical activity 6. Evidence for depression or mood disorders The patients weight, height, BMI have been recorded in the chart. I have made referrals,  counseling and provided education to the patient based on review of the above and I have provided the pt with a written personalized care plan for preventive services. Provider list updated.. See scanned questionairre as needed for further documentation. Reviewed preventative protocols and updated unless pt declined.   Pt will bring back medicare questionairre to review.       Other Visit Diagnoses    Special screening for malignant neoplasms, colon        Relevant Orders    Fecal occult blood, imunochemical        Follow up plan: Return in about 6 months (around 04/17/2015), or as needed, for follow up visit.

## 2014-10-16 NOTE — Assessment & Plan Note (Signed)
LDL at goal. Trig too high - increase fish oil to 2 gm daily. Discussed dietary choices to improve trig.

## 2014-10-16 NOTE — Assessment & Plan Note (Signed)
Advanced directives - has discussed with family. No children. HCProxy would be Dawn, girlfriend as well as sister (who lives in [place]). Does not want prolonged life support. Packet provided today.

## 2014-10-21 ENCOUNTER — Telehealth: Payer: Self-pay

## 2014-10-21 NOTE — Telephone Encounter (Signed)
[place] nurse case mgr with [facility] request date of last wellness exam, BP and date of  last A1C and results; pt is enrolled in [facility] diabetic program. Jade notified last wellness exam was 10/16/14 and BP was 126/84; A1c was 7.0 on 10/09/2014. Jade voiced understanding.

## 2014-12-24 ENCOUNTER — Encounter: Payer: Self-pay | Admitting: Family Medicine

## 2014-12-24 ENCOUNTER — Ambulatory Visit (INDEPENDENT_AMBULATORY_CARE_PROVIDER_SITE_OTHER): Payer: Medicare Other | Admitting: Family Medicine

## 2014-12-24 VITALS — BP 124/84 | HR 88 | Temp 97.9°F | Wt 275.5 lb

## 2014-12-24 DIAGNOSIS — IMO0001 Reserved for inherently not codable concepts without codable children: Secondary | ICD-10-CM

## 2014-12-24 DIAGNOSIS — L03011 Cellulitis of right finger: Secondary | ICD-10-CM

## 2014-12-24 MED ORDER — DOXYCYCLINE HYCLATE 100 MG PO TABS: 100.0000 mg | ORAL_TABLET | Freq: Two times a day (BID) | ORAL | Status: DC

## 2014-12-24 NOTE — Patient Instructions (Addendum)
For paronychia continue warm water soaks 2-3 times daily, start antibiotic provided today.  Let us know if not improving or any worsening -streaking redness, draining pus, or fevers.  Paronychia Paronychia is an inflammatory reaction involving the folds of the skin surrounding the fingernail. This is commonly caused by an infection in the skin around a nail. The most common cause of paronychia is frequent wetting of the hands (as seen with bartenders, food servers, nurses or others who wet their hands). This makes the skin around the fingernail susceptible to infection by bacteria (germs) or fungus. Other predisposing factors are:  Aggressive manicuring.  Nail biting.  Thumb sucking. The most common cause is a staphylococcal (a type of germ) infection, or a fungal (Candida) infection. When caused by a germ, it usually comes on suddenly with redness, swelling, pus and is often painful. It may get under the nail and form an abscess (collection of pus), or form an abscess around the nail. If the nail itself is infected with a fungus, the treatment is usually prolonged and may require oral medicine for up to one year. Your caregiver will determine the length of time treatment is required. The paronychia caused by bacteria (germs) may largely be avoided by not pulling on hangnails or picking at cuticles. When the infection occurs at the tips of the finger it is called felon. When the cause of paronychia is from the herpes simplex virus (HSV) it is called herpetic whitlow. TREATMENT  When an abscess is present treatment is often incision and drainage. This means that the abscess must be cut open so the pus can get out. When this is done, the following home care instructions should be followed. HOME CARE INSTRUCTIONS   It is important to keep the affected fingers very dry. Rubber or plastic gloves over cotton gloves should be used whenever the hand must be placed in water.  Keep wound clean, dry and  dressed as suggested by your caregiver between warm soaks or warm compresses.  Soak in warm water for fifteen to twenty minutes three to four times per day for bacterial infections. Fungal infections are very difficult to treat, so often require treatment for long periods of time.  For bacterial (germ) infections take antibiotics (medicine which kill germs) as directed and finish the prescription, even if the problem appears to be solved before the medicine is gone.  Only take over-the-counter or prescription medicines for pain, discomfort, or fever as directed by your caregiver. SEEK IMMEDIATE MEDICAL CARE IF:  You have redness, swelling, or increasing pain in the wound.  You notice pus coming from the wound.  You have a fever.  You notice a bad smell coming from the wound or dressing. Document Released: 10/06/2000 Document Revised: 07/05/2011 Document Reviewed: 06/07/2008 [facility] Patient Information 2015 [place], [place]. This information is not intended to replace advice given to you by your health care provider. Make sure you discuss any questions you have with your health care provider.

## 2014-12-24 NOTE — Progress Notes (Signed)
Pre visit review using our clinic review tool, if applicable. No additional management support is needed unless otherwise documented below in the visit note. 

## 2014-12-24 NOTE — Progress Notes (Signed)
BP 124/84 mmHg  Pulse 88  Temp(Src) 97.9 F (36.6 C) (Oral)  Wt 275 lb 8 oz (124.966 kg)   CC: check finger infection  Subjective:    Patient ID: [name], male    DOB: 1955-10-18, 59 y.o.   MRN: [id number]  HPI: [name] is a 59 y.o. male presenting on 12/24/2014 for Finger infection   2wk h/o R 3rd digit infection around nail. Started after digging in the garden. Drained with small amt white pus. No fevers/chills, nausea, streaking redness, no DIP pain.   Has been self treating with ethicomol drawing salve, peroxide and triple abx ointment without significant improvement.   Not currently using any pain medication.   No h/o herpes that he knows of.   Diabetic. Lab Results  Component Value Date   HGBA1C 7.0* 10/09/2014    Lab Results  Component Value Date   CREATININE 0.70 10/09/2014     Relevant past medical, surgical, family and social history reviewed and updated as indicated. Interim medical history since our last visit reviewed. Allergies and medications reviewed and updated. Current Outpatient Prescriptions on File Prior to Visit  Medication Sig  . ACCU-CHEK FASTCLIX LANCETS MISC Check blood sugar once daily and as instructed. Dx 250.00  . aspirin EC 81 MG tablet Take 81 mg by mouth daily.  . Blood Glucose Monitoring Suppl (BLOOD GLUCOSE MONITOR SYSTEM) W/DEVICE KIT by Does not apply route. Use to check sugar once daily and as needed Dx: E11.9 **ONE TOUCH VERIO**  . carvedilol (COREG) 6.25 MG tablet TAKE 1 TABLET BY MOUTH 2 TIMES DAILY WITH A MEAL.  . fluticasone (FLONASE) 50 MCG/ACT nasal spray Place 2 sprays into both nostrils daily.  . furosemide (LASIX) 20 MG tablet TAKE 1 TABLET (20 MG TOTAL) BY MOUTH 2 (TWO) TIMES DAILY AS NEEDED.  Marland Kitchen glucose blood test strip 1 each by Other route as directed. Use to check sugar once daily and as needed. Dx:E11.9  **ONE TOUCH VERIO**  . KLOR-CON 10 10 MEQ tablet TAKE 1 TABLET (10 MEQ TOTAL) BY MOUTH DAILY.  .  metFORMIN (GLUCOPHAGE) 500 MG tablet Take one tablet by mouth every evening with dinner.  . Omega-3 Fatty Acids (FISH OIL) 1000 MG CAPS Take 2 capsules (2,000 mg total) by mouth daily.  . sertraline (ZOLOFT) 100 MG tablet Take 1 tablet (100 mg total) by mouth daily.  . simvastatin (ZOCOR) 20 MG tablet Take 1 tablet (20 mg total) by mouth every evening.   No current facility-administered medications on file prior to visit.    Review of Systems Per HPI unless specifically indicated above     Objective:    BP 124/84 mmHg  Pulse 88  Temp(Src) 97.9 F (36.6 C) (Oral)  Wt 275 lb 8 oz (124.966 kg)  Wt Readings from Last 3 Encounters:  12/24/14 275 lb 8 oz (124.966 kg)  10/16/14 272 lb 1.9 oz (123.433 kg)  05/29/14 276 lb (125.193 kg)    Physical Exam  Constitutional: He appears well-developed and well-nourished. No distress.  Musculoskeletal: He exhibits edema.  Skin: Skin is warm and dry. No rash noted.  R third digit with erythema around nailbed, marked tenderness/swelling lateral to nail at tip of digit. FROM at DIP without tenderness. No streaking redness.  Nursing note and vitals reviewed.  Procedure:  Finger cleaned with alcohol pad then betadine, ethyl chloride for numbing, then 18g needle used to make small incision lateral nail without significant drainage. Pt tolerated well.  Assessment & Plan:   Problem List Items Addressed This Visit    Paronychia of third finger, right - Primary    Subacute - ongoing 2 wks. Simple I&D performed without significant drainage. Treat with warm water soaks 2-3 times daily + start doxycycline 10d course. Update if red flags like worsening pain/swelling, affecting joint or any fevers or streaking redness. Pt agrees with plan.          Follow up plan: Return if symptoms worsen or fail to improve.

## 2014-12-24 NOTE — Assessment & Plan Note (Signed)
Subacute - ongoing 2 wks. Simple I&D performed without significant drainage. Treat with warm water soaks 2-3 times daily + start doxycycline 10d course. Update if red flags like worsening pain/swelling, affecting joint or any fevers or streaking redness. Pt agrees with plan.

## 2015-01-21 ENCOUNTER — Encounter: Payer: Self-pay | Admitting: Family Medicine

## 2015-01-21 ENCOUNTER — Ambulatory Visit (INDEPENDENT_AMBULATORY_CARE_PROVIDER_SITE_OTHER): Payer: Medicare Other | Admitting: Family Medicine

## 2015-01-21 VITALS — BP 138/88 | HR 76 | Temp 98.1°F | Wt 274.8 lb

## 2015-01-21 DIAGNOSIS — J019 Acute sinusitis, unspecified: Secondary | ICD-10-CM | POA: Diagnosis not present

## 2015-01-21 NOTE — Patient Instructions (Addendum)
You have a sinus infection but likely viral. Push fluids and plenty of rest.  Nasal saline irrigation or neti pot to help drain sinuses.  Restart flonase. May take ibuprofen with meals for inflammation.  May use plain mucinex with plenty of fluid to help mobilize mucous.  If not improving as expected over next 4-5 days let us know. If fever>101, or initial improvement then worsening let us know.

## 2015-01-21 NOTE — Progress Notes (Signed)
BP 138/88 mmHg  Pulse 76  Temp(Src) 98.1 F (36.7 C) (Oral)  Wt 274 lb 12 oz (124.626 kg)   CC: sinusitis  Subjective:    Patient ID: [name], male    DOB: Dec 07, 1955, 59 y.o.   MRN: [id number]  HPI: CYNCERE [name] is a 59 y.o. male presenting on 01/21/2015 for Sinusitis   4d h/o sinus pressure along with some dizziness and vertigo, headache with fuzzy head, constantly clearing throat. Dentures are hurting. Mild nausea. Mild dyspnea.   No fevers/chills, no significant coughing (improved since he quit smoking 2 wks ago), no ear pain, PNdrainage, abd pain.   No sick contacts at home. Known COPD, chronic systolic CHF and dilated nonischemic CM. Not taking flonase.   Relevant past medical, surgical, family and social history reviewed and updated as indicated. Interim medical history since our last visit reviewed. Allergies and medications reviewed and updated. Current Outpatient Prescriptions on File Prior to Visit  Medication Sig  . ACCU-CHEK FASTCLIX LANCETS MISC Check blood sugar once daily and as instructed. Dx 250.00  . aspirin EC 81 MG tablet Take 81 mg by mouth daily.  . Blood Glucose Monitoring Suppl (BLOOD GLUCOSE MONITOR SYSTEM) W/DEVICE KIT by Does not apply route. Use to check sugar once daily and as needed Dx: E11.9 **ONE TOUCH VERIO**  . carvedilol (COREG) 6.25 MG tablet TAKE 1 TABLET BY MOUTH 2 TIMES DAILY WITH A MEAL.  . furosemide (LASIX) 20 MG tablet TAKE 1 TABLET (20 MG TOTAL) BY MOUTH 2 (TWO) TIMES DAILY AS NEEDED.  Marland Kitchen glucose blood test strip 1 each by Other route as directed. Use to check sugar once daily and as needed. Dx:E11.9  **ONE TOUCH VERIO**  . KLOR-CON 10 10 MEQ tablet TAKE 1 TABLET (10 MEQ TOTAL) BY MOUTH DAILY.  . metFORMIN (GLUCOPHAGE) 500 MG tablet Take one tablet by mouth every evening with dinner.  . Omega-3 Fatty Acids (FISH OIL) 1000 MG CAPS Take 2 capsules (2,000 mg total) by mouth daily.  . sertraline (ZOLOFT) 100 MG tablet Take 1  tablet (100 mg total) by mouth daily.  . simvastatin (ZOCOR) 20 MG tablet Take 1 tablet (20 mg total) by mouth every evening.  . fluticasone (FLONASE) 50 MCG/ACT nasal spray Place 2 sprays into both nostrils daily. (Patient not taking: Reported on 01/21/2015)   No current facility-administered medications on file prior to visit.    Review of Systems Per HPI unless specifically indicated above     Objective:    BP 138/88 mmHg  Pulse 76  Temp(Src) 98.1 F (36.7 C) (Oral)  Wt 274 lb 12 oz (124.626 kg)  Wt Readings from Last 3 Encounters:  01/21/15 274 lb 12 oz (124.626 kg)  12/24/14 275 lb 8 oz (124.966 kg)  10/16/14 272 lb 1.9 oz (123.433 kg)    Physical Exam  Constitutional: He appears well-developed and well-nourished. No distress.  HENT:  Head: Normocephalic and atraumatic.  Right Ear: Hearing, tympanic membrane, external ear and ear canal normal.  Left Ear: Hearing, tympanic membrane, external ear and ear canal normal.  Nose: Mucosal edema present. No rhinorrhea. Right sinus exhibits no maxillary sinus tenderness and no frontal sinus tenderness. Left sinus exhibits no maxillary sinus tenderness and no frontal sinus tenderness.  Mouth/Throat: Uvula is midline and mucous membranes are normal. Posterior oropharyngeal erythema (mild) present. No oropharyngeal exudate, posterior oropharyngeal edema or tonsillar abscesses.  Eyes: Conjunctivae and EOM are normal. Pupils are equal, round, and reactive to light. No  scleral icterus.  Neck: Normal range of motion. Neck supple.  Cardiovascular: Normal rate, regular rhythm, normal heart sounds and intact distal pulses.   No murmur heard. Pulmonary/Chest: Effort normal and breath sounds normal. No respiratory distress. He has no wheezes. He has no rales.  coarse  Lymphadenopathy:    He has no cervical adenopathy.  Skin: Skin is warm and dry. No rash noted.  Nursing note and vitals reviewed.      Assessment & Plan:   Problem List  Items Addressed This Visit    Acute sinusitis - Primary    Anticipate viral given short duration. Discussed. Rec supportive care including mucinex, flonase, and ibuprofen. Significant comorbidities - low threshold to start abx. Discussed red flags to update [place] for abx.           Follow up plan: Return if symptoms worsen or fail to improve.

## 2015-01-21 NOTE — Assessment & Plan Note (Signed)
Anticipate viral given short duration. Discussed. Rec supportive care including mucinex, flonase, and ibuprofen. Significant comorbidities - low threshold to start abx. Discussed red flags to update [place] for abx.

## 2015-01-21 NOTE — Progress Notes (Signed)
Pre visit review using our clinic review tool, if applicable. No additional management support is needed unless otherwise documented below in the visit note. 

## 2015-02-06 ENCOUNTER — Ambulatory Visit (INDEPENDENT_AMBULATORY_CARE_PROVIDER_SITE_OTHER): Payer: Medicare Other | Admitting: Family Medicine

## 2015-02-06 ENCOUNTER — Encounter: Payer: Self-pay | Admitting: Family Medicine

## 2015-02-06 VITALS — BP 140/84 | HR 72 | Temp 98.3°F | Wt 277.5 lb

## 2015-02-06 DIAGNOSIS — M549 Dorsalgia, unspecified: Secondary | ICD-10-CM | POA: Diagnosis not present

## 2015-02-06 LAB — COMPREHENSIVE METABOLIC PANEL
ALT: 21 U/L (ref 0–53)
AST: 17 U/L (ref 0–37)
Albumin: 4.1 g/dL (ref 3.5–5.2)
Alkaline Phosphatase: 70 U/L (ref 39–117)
BUN: 6 mg/dL (ref 6–23)
CHLORIDE: 100 meq/L (ref 96–112)
CO2: 32 mEq/L (ref 19–32)
Calcium: 9.4 mg/dL (ref 8.4–10.5)
Creatinine, Ser: 0.75 mg/dL (ref 0.40–1.50)
GFR: 113.14 mL/min (ref >60.00)
GLUCOSE: 171 mg/dL — AB (ref 70–99)
Potassium: 4.8 mEq/L (ref 3.5–5.1)
SODIUM: 138 meq/L (ref 135–145)
Total Bilirubin: 0.4 mg/dL (ref 0.2–1.2)
Total Protein: 7.1 g/dL (ref 6.0–8.3)

## 2015-02-06 LAB — CBC WITH DIFFERENTIAL/PLATELET
BASOS ABS: 0 10*3/uL (ref 0.0–0.1)
Basophils Relative: 0.4 % (ref 0.0–3.0)
EOS ABS: 0.2 10*3/uL (ref 0.0–0.7)
Eosinophils Relative: 2.1 % (ref 0.0–5.0)
HCT: 48.3 % (ref 39.0–52.0)
Hemoglobin: 16.2 g/dL (ref 13.0–17.0)
LYMPHS ABS: 2.5 10*3/uL (ref 0.7–4.0)
Lymphocytes Relative: 22.6 % (ref 12.0–46.0)
MCHC: 33.5 g/dL (ref 30.0–36.0)
MCV: 92.2 fl (ref 78.0–100.0)
MONO ABS: 0.9 10*3/uL (ref 0.1–1.0)
MONOS PCT: 7.9 % (ref 3.0–12.0)
NEUTROS PCT: 67 % (ref 43.0–77.0)
Neutro Abs: 7.5 10*3/uL (ref 1.4–7.7)
Platelets: 264 10*3/uL (ref 150.0–400.0)
RBC: 5.24 Mil/uL (ref 4.22–5.81)
RDW: 13.4 % (ref 11.5–15.5)
WBC: 11.2 10*3/uL — ABNORMAL HIGH (ref 4.0–10.5)

## 2015-02-06 MED ORDER — TRAMADOL HCL 50 MG PO TABS
50.0000 mg | ORAL_TABLET | Freq: Three times a day (TID) | ORAL | Status: DC | PRN
Start: 2015-02-06 — End: 2015-04-11

## 2015-02-06 MED ORDER — CYCLOBENZAPRINE HCL 5 MG PO TABS: 5.0000 mg | ORAL_TABLET | Freq: Two times a day (BID) | ORAL | Status: DC | PRN

## 2015-02-06 NOTE — Patient Instructions (Signed)
I think this is musculoskeletal pain from left sided back strain. Treat with gentle stretches. Start heating pad to back.  May use tramadol for breakthrough pain. May use flexeril muscle relaxant. Blood work today. Let us know if not improving with treatment.

## 2015-02-06 NOTE — Assessment & Plan Note (Addendum)
Anticipate musculoskeletal strain given description and exam.  rec treat with flexeril and tramadol for discomfort, rec gentle stretching and heating pad use. Update if not improving with treatment. Check UA to r/o nephrolithiasis, and CBC,CMP today to further eval for infection/inflammation. Not consistent with shingles.  ADDENDUM ==> UA not run.

## 2015-02-06 NOTE — Progress Notes (Signed)
Pre visit review using our clinic review tool, if applicable. No additional management support is needed unless otherwise documented below in the visit note. 

## 2015-02-06 NOTE — Progress Notes (Addendum)
BP 140/84 mmHg  Pulse 72  Temp(Src) 98.3 F (36.8 C) (Oral)  Wt 277 lb 8 oz (125.873 kg)   CC: back pain  Subjective:    Patient ID: [name], male    DOB: 1955-12-24, 59 y.o.   MRN: [id number]  HPI: [name] is a 59 y.o. male presenting on 02/06/2015 for Back Pain   3d h/o left lower thoracic back pain, denies inciting trauma/injury. Pain starts left thoracic back and travels midline. Sharp severe stabbing. Treated with aleve last night.   No alleviating factors. Worse with bending, laying on back.   No fevers/chills, rashes, paresthesias, denies chest pain, palpitations, nausea/vomiting, abd pain. Denies UTI sxs  Feels more chest congestion with cough than normal along with mild dyspnea.  Relevant past medical, surgical, family and social history reviewed and updated as indicated. Interim medical history since our last visit reviewed. Allergies and medications reviewed and updated. Current Outpatient Prescriptions on File Prior to Visit  Medication Sig  . ACCU-CHEK FASTCLIX LANCETS MISC Check blood sugar once daily and as instructed. Dx 250.00  . aspirin EC 81 MG tablet Take 81 mg by mouth daily.  . Blood Glucose Monitoring Suppl (BLOOD GLUCOSE MONITOR SYSTEM) W/DEVICE KIT by Does not apply route. Use to check sugar once daily and as needed Dx: E11.9 **ONE TOUCH VERIO**  . carvedilol (COREG) 6.25 MG tablet TAKE 1 TABLET BY MOUTH 2 TIMES DAILY WITH A MEAL.  . fluticasone (FLONASE) 50 MCG/ACT nasal spray Place 2 sprays into both nostrils daily.  . furosemide (LASIX) 20 MG tablet TAKE 1 TABLET (20 MG TOTAL) BY MOUTH 2 (TWO) TIMES DAILY AS NEEDED.  Marland Kitchen glucose blood test strip 1 each by Other route as directed. Use to check sugar once daily and as needed. Dx:E11.9  **ONE TOUCH VERIO**  . KLOR-CON 10 10 MEQ tablet TAKE 1 TABLET (10 MEQ TOTAL) BY MOUTH DAILY.  . metFORMIN (GLUCOPHAGE) 500 MG tablet Take one tablet by mouth every evening with dinner.  . Omega-3 Fatty  Acids (FISH OIL) 1000 MG CAPS Take 2 capsules (2,000 mg total) by mouth daily.  . sertraline (ZOLOFT) 100 MG tablet Take 1 tablet (100 mg total) by mouth daily.  . simvastatin (ZOCOR) 20 MG tablet Take 1 tablet (20 mg total) by mouth every evening.   No current facility-administered medications on file prior to visit.    Review of Systems Per HPI unless specifically indicated above     Objective:    BP 140/84 mmHg  Pulse 72  Temp(Src) 98.3 F (36.8 C) (Oral)  Wt 277 lb 8 oz (125.873 kg)  Wt Readings from Last 3 Encounters:  02/06/15 277 lb 8 oz (125.873 kg)  01/21/15 274 lb 12 oz (124.626 kg)  12/24/14 275 lb 8 oz (124.966 kg)    Physical Exam  Constitutional: He appears well-developed and well-nourished. No distress.  HENT:  Mouth/Throat: No oropharyngeal exudate.  Cardiovascular: Normal rate, regular rhythm, normal heart sounds and intact distal pulses.   No murmur heard. Pulmonary/Chest: Effort normal and breath sounds normal. No respiratory distress. He has no wheezes. He has no rales.  Abdominal: Soft. Bowel sounds are normal. He exhibits no distension and no mass. There is no tenderness. There is no rigidity, no rebound, no guarding, no CVA tenderness and negative Murphy's sign.  obese  Musculoskeletal: He exhibits no edema.  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 No rash noted.  No vesicular rash  Psychiatric: He has a normal mood and affect.  Nursing note and vitals reviewed.  Results for orders placed or performed in visit on 02/06/15  Comprehensive metabolic panel  Result Value Ref Range   Sodium 138 135 - 145 mEq/L   Potassium 4.8 3.5 - 5.1 mEq/L   Chloride 100 96 - 112 mEq/L   CO2 32 19 - 32 mEq/L   Glucose, Bld  171 (H) 70 - 99 mg/dL   BUN 6 6 - 23 mg/dL   Creatinine, Ser 0.75 0.40 - 1.50 mg/dL   Total Bilirubin 0.4 0.2 - 1.2 mg/dL   Alkaline Phosphatase 70 39 - 117 U/L   AST 17 0 - 37 U/L   ALT 21 0 - 53 U/L   Total Protein 7.1 6.0 - 8.3 g/dL   Albumin 4.1 3.5 - 5.2 g/dL   Calcium 9.4 8.4 - 10.5 mg/dL   GFR 113.14 >60.00 mL/min  CBC with Differential/Platelet  Result Value Ref Range   WBC 11.2 (H) 4.0 - 10.5 K/uL   RBC 5.24 4.22 - 5.81 Mil/uL   Hemoglobin 16.2 13.0 - 17.0 g/dL   HCT 48.3 39.0 - 52.0 %   MCV 92.2 78.0 - 100.0 fl   MCHC 33.5 30.0 - 36.0 g/dL   RDW 13.4 11.5 - 15.5 %   Platelets 264.0 150.0 - 400.0 K/uL   Neutrophils Relative % 67.0 43.0 - 77.0 %   Lymphocytes Relative 22.6 12.0 - 46.0 %   Monocytes Relative 7.9 3.0 - 12.0 %   Eosinophils Relative 2.1 0.0 - 5.0 %   Basophils Relative 0.4 0.0 - 3.0 %   Neutro Abs 7.5 1.4 - 7.7 K/uL   Lymphs Abs 2.5 0.7 - 4.0 K/uL   Monocytes Absolute 0.9 0.1 - 1.0 K/uL   Eosinophils Absolute 0.2 0.0 - 0.7 K/uL   Basophils Absolute 0.0 0.0 - 0.1 K/uL      Assessment & Plan:  Over 25 minutes were spent face-to-face with the patient during this encounter and >50% of that time was spent on counseling and coordination of care  Problem List Items Addressed This Visit    Left-sided back pain - Primary    Anticipate musculoskeletal strain given description and exam.  rec treat with flexeril and tramadol for discomfort, rec gentle stretching and heating pad use. Update if not improving with treatment. Check UA to r/o nephrolithiasis, and CBC,CMP today to further eval for infection/inflammation. Not consistent with shingles.  ADDENDUM ==> UA not run.       Relevant Medications   traMADol (ULTRAM) 50 MG tablet   cyclobenzaprine (FLEXERIL) 5 MG tablet   Other Relevant Orders   POCT Urinalysis Dipstick   Comprehensive metabolic panel (Completed)   CBC with Differential/Platelet (Completed)       Follow up plan: Return if symptoms  worsen or fail to improve.

## 2015-02-09 NOTE — Addendum Note (Signed)
Addended by: [name] on: 02/09/2015 12:12 PM   Modules accepted: Orders

## 2015-02-15 ENCOUNTER — Telehealth: Payer: Self-pay | Admitting: Family Medicine

## 2015-02-15 NOTE — Telephone Encounter (Signed)
plz call for update on L sided pain - if persistent rec come in for UA. Also remind him to return iFOB as it has not been done.

## 2015-02-17 NOTE — Telephone Encounter (Signed)
Spoke with patient. He said the pain is almost gone and he was feeling MUCH better. He had forgotten about iFOB and said he will return it. He also said the flexeril wasn't covered by insurance and he was running low on cash when it was prescribed. He is going to get it at the first of the month when he gets paid again and asked if the Rx expires at the pharmacy, could it be resent. I advised we would do that. He thinks it will help more so than the tramadol.

## 2015-02-21 ENCOUNTER — Encounter: Payer: Self-pay | Admitting: Family Medicine

## 2015-02-21 ENCOUNTER — Ambulatory Visit (INDEPENDENT_AMBULATORY_CARE_PROVIDER_SITE_OTHER): Payer: Medicare Other | Admitting: Family Medicine

## 2015-02-21 VITALS — BP 126/84 | HR 88 | Temp 98.1°F | Wt 279.5 lb

## 2015-02-21 DIAGNOSIS — R140 Abdominal distension (gaseous): Secondary | ICD-10-CM | POA: Diagnosis not present

## 2015-02-21 DIAGNOSIS — G4733 Obstructive sleep apnea (adult) (pediatric): Secondary | ICD-10-CM

## 2015-02-21 DIAGNOSIS — I4892 Unspecified atrial flutter: Secondary | ICD-10-CM

## 2015-02-21 DIAGNOSIS — I429 Cardiomyopathy, unspecified: Secondary | ICD-10-CM

## 2015-02-21 DIAGNOSIS — I519 Heart disease, unspecified: Secondary | ICD-10-CM | POA: Diagnosis not present

## 2015-02-21 DIAGNOSIS — Z23 Encounter for immunization: Secondary | ICD-10-CM | POA: Diagnosis not present

## 2015-02-21 DIAGNOSIS — I428 Other cardiomyopathies: Secondary | ICD-10-CM

## 2015-02-21 DIAGNOSIS — R0600 Dyspnea, unspecified: Secondary | ICD-10-CM

## 2015-02-21 MED ORDER — POTASSIUM CHLORIDE ER 10 MEQ PO TBCR: EXTENDED_RELEASE_TABLET | ORAL | Status: DC

## 2015-02-21 MED ORDER — METHOCARBAMOL 500 MG PO TABS: 500.0000 mg | ORAL_TABLET | Freq: Three times a day (TID) | ORAL | Status: DC | PRN

## 2015-02-21 MED ORDER — SIMVASTATIN 20 MG PO TABS: 20.0000 mg | ORAL_TABLET | Freq: Every evening | ORAL | Status: DC

## 2015-02-21 NOTE — Patient Instructions (Addendum)
EKG today, flu shot today Get back on CPAP machine.  Take carvedilol twice daily. Take lasix once daily with second dose if needed.  Take gas X as needed for gassiness.  Exclude gas producing foods (beans, onions, celery, carrots, raisins, bananas, apricots, prunes, brussel sprouts, wheat germ, pretzels) Try activia daily (or some other form of probiotic). Let us know how you do with above changes.

## 2015-02-21 NOTE — Assessment & Plan Note (Signed)
Anticipate GI related but pt denies GERD sxs so did not start PPI. rec gas X, avoid gas producing foods, and trial daily probiotic. Update with effect.

## 2015-02-21 NOTE — Addendum Note (Signed)
Addended by: Royann Shivers A on: 02/21/2015 12:09 PM   Modules accepted: Orders

## 2015-02-21 NOTE — Progress Notes (Signed)
Pre visit review using our clinic review tool, if applicable. No additional management support is needed unless otherwise documented below in the visit note. 

## 2015-02-21 NOTE — Assessment & Plan Note (Addendum)
Endorses dyspnea related to gassiness - in h/o atrial fibrillation/flutter and systolic CHF with nonischemic cardiomyopathy will check EKG today. Consider f/u with cards if persistent dypsnea. EKG - NSR rate 80s, normal axis, intervals, no acute ST/T changes

## 2015-02-21 NOTE — Assessment & Plan Note (Signed)
Encouraged he restart CPAP machine. He has not seen pulm recently.

## 2015-02-21 NOTE — Assessment & Plan Note (Addendum)
No recent echo. Check EKG today. Discussed lasix/kdur use.

## 2015-02-21 NOTE — Assessment & Plan Note (Signed)
S/p ablation, off anticoagulation. Sounds regular today - check EKG.

## 2015-02-21 NOTE — Assessment & Plan Note (Signed)
Latest echo stable. Suggested f/u with cards if no improvement in dyspnea.

## 2015-02-21 NOTE — Progress Notes (Addendum)
BP 126/84 mmHg  Pulse 88  Temp(Src) 98.1 F (36.7 C) (Oral)  Wt 279 lb 8 oz (126.78 kg)  SpO2 97%   CC: 2 wk f/u   Subjective:    Patient ID: [name], male    DOB: [birth year], 59 y.o.   MRN: [id number]  HPI: [name] is a 59 y.o. male presenting on 02/21/2015 for Back Pain; Gas; and Shortness of Breath   Seen here 2 wks ago with presumed MSK pain - CBC, CMP were stable. Back pain has improved. Requests different muscle relaxant as flexeril was very expensive and not covered by insurance.   Noticing worsening gas that is painful. At times to point of dyspnea. Waking up with increased phlegm that he feels comes from lungs. Also notices increased dyspnea. No increased wheezing or significant cough, fevers/chills, leg swelling, palpitations. Avoids gas producing foods.   Known dilated cardiomyopathy and systolic CHF. Also known atrial fibrillation. Last saw Dr Nehemiah Massed 05/2014. Takes lasix 28m daily, taking carvedilol only once daily.   Quit smoking 4 months ago.   Relevant past medical, surgical, family and social history reviewed and updated as indicated. Interim medical history since our last visit reviewed. Allergies and medications reviewed and updated. Current Outpatient Prescriptions on File Prior to Visit  Medication Sig  . ACCU-CHEK FASTCLIX LANCETS MISC Check blood sugar once daily and as instructed. Dx 250.00  . aspirin EC 81 MG tablet Take 81 mg by mouth daily.  . Blood Glucose Monitoring Suppl (BLOOD GLUCOSE MONITOR SYSTEM) W/DEVICE KIT by Does not apply route. Use to check sugar once daily and as needed Dx: E11.9 **ONE TOUCH VERIO**  . carvedilol (COREG) 6.25 MG tablet TAKE 1 TABLET BY MOUTH 2 TIMES DAILY WITH A MEAL.  . fluticasone (FLONASE) 50 MCG/ACT nasal spray Place 2 sprays into both nostrils daily.  . furosemide (LASIX) 20 MG tablet TAKE 1 TABLET (20 MG TOTAL) BY MOUTH 2 (TWO) TIMES DAILY AS NEEDED.  .Marland Kitchenglucose blood test strip 1 each by Other  route as directed. Use to check sugar once daily and as needed. Dx:E11.9  **ONE TOUCH VERIO**  . metFORMIN (GLUCOPHAGE) 500 MG tablet Take one tablet by mouth every evening with dinner.  . Omega-3 Fatty Acids (FISH OIL) 1000 MG CAPS Take 2 capsules (2,000 mg total) by mouth daily.  . sertraline (ZOLOFT) 100 MG tablet Take 1 tablet (100 mg total) by mouth daily.  . traMADol (ULTRAM) 50 MG tablet Take 1 tablet (50 mg total) by mouth every 8 (eight) hours as needed.   No current facility-administered medications on file prior to visit.    Review of Systems Per HPI unless specifically indicated in ROS section     Objective:    BP 126/84 mmHg  Pulse 88  Temp(Src) 98.1 F (36.7 C) (Oral)  Wt 279 lb 8 oz (126.78 kg)  SpO2 97%  Wt Readings from Last 3 Encounters:  02/21/15 279 lb 8 oz (126.78 kg)  02/06/15 277 lb 8 oz (125.873 kg)  01/21/15 274 lb 12 oz (124.626 kg)    Physical Exam  Constitutional: He appears well-developed and well-nourished. No distress.  HENT:  Mouth/Throat: Oropharynx is clear and moist. No oropharyngeal exudate.  Cardiovascular: Normal rate, regular rhythm, normal heart sounds and intact distal pulses.   No murmur heard. Pulmonary/Chest: Effort normal and breath sounds normal. No respiratory distress. He has no wheezes. He has no rales.  Abdominal: Soft. Bowel sounds are normal. He exhibits distension (mild).  He exhibits no mass. There is no hepatosplenomegaly. There is no tenderness. There is no rigidity, no rebound, no guarding and negative Murphy's sign.  Musculoskeletal: He exhibits edema (tr pedal edema).  Lymphadenopathy:    He has no cervical adenopathy.  Nursing note and vitals reviewed.  Results for orders placed or performed in visit on 02/06/15  Comprehensive metabolic panel  Result Value Ref Range   Sodium 138 135 - 145 mEq/L   Potassium 4.8 3.5 - 5.1 mEq/L   Chloride 100 96 - 112 mEq/L   CO2 32 19 - 32 mEq/L   Glucose, Bld 171 (H) 70 - 99  mg/dL   BUN 6 6 - 23 mg/dL   Creatinine, Ser 0.75 0.40 - 1.50 mg/dL   Total Bilirubin 0.4 0.2 - 1.2 mg/dL   Alkaline Phosphatase 70 39 - 117 U/L   AST 17 0 - 37 U/L   ALT 21 0 - 53 U/L   Total Protein 7.1 6.0 - 8.3 g/dL   Albumin 4.1 3.5 - 5.2 g/dL   Calcium 9.4 8.4 - 10.5 mg/dL   GFR 113.14 >60.00 mL/min  CBC with Differential/Platelet  Result Value Ref Range   WBC 11.2 (H) 4.0 - 10.5 K/uL   RBC 5.24 4.22 - 5.81 Mil/uL   Hemoglobin 16.2 13.0 - 17.0 g/dL   HCT 48.3 39.0 - 52.0 %   MCV 92.2 78.0 - 100.0 fl   MCHC 33.5 30.0 - 36.0 g/dL   RDW 13.4 11.5 - 15.5 %   Platelets 264.0 150.0 - 400.0 K/uL   Neutrophils Relative % 67.0 43.0 - 77.0 %   Lymphocytes Relative 22.6 12.0 - 46.0 %   Monocytes Relative 7.9 3.0 - 12.0 %   Eosinophils Relative 2.1 0.0 - 5.0 %   Basophils Relative 0.4 0.0 - 3.0 %   Neutro Abs 7.5 1.4 - 7.7 K/uL   Lymphs Abs 2.5 0.7 - 4.0 K/uL   Monocytes Absolute 0.9 0.1 - 1.0 K/uL   Eosinophils Absolute 0.2 0.0 - 0.7 K/uL   Basophils Absolute 0.0 0.0 - 0.1 K/uL      Assessment & Plan:   Problem List Items Addressed This Visit    Obstructive sleep apnea    Encouraged he restart CPAP machine. He has not seen pulm recently.       Nonischemic cardiomyopathy ([place])    Latest echo stable. Suggested f/u with cards if no improvement in dyspnea.      Relevant Medications   simvastatin (ZOCOR) 20 MG tablet   Gassiness - Primary    Anticipate GI related but pt denies GERD sxs so did not start PPI. rec gas X, avoid gas producing foods, and trial daily probiotic. Update with effect.      Dyspnea    Endorses dyspnea related to gassiness - in h/o atrial fibrillation/flutter and systolic CHF with nonischemic cardiomyopathy will check EKG today. Consider f/u with cards if persistent dypsnea. EKG - NSR rate 80s, normal axis, intervals, no acute ST/T changes      Relevant Orders   EKG 12-Lead (Completed)   Chronic systolic dysfunction of left ventricle    No recent  echo. Check EKG today. Discussed lasix/kdur use.      Relevant Medications   simvastatin (ZOCOR) 20 MG tablet   Atrial flutter ([facility])    S/p ablation, off anticoagulation. Sounds regular today - check EKG.      Relevant Medications   simvastatin (ZOCOR) 20 MG tablet   Other Relevant Orders  EKG 12-Lead (Completed)    Other Visit Diagnoses    Need for influenza vaccination        Relevant Orders    Flu Vaccine QUAD 36+ mos PF IM (Fluarix & Fluzone Quad PF) (Completed)        Follow up plan: No Follow-up on file.

## 2015-02-24 ENCOUNTER — Encounter: Payer: Self-pay | Admitting: *Deleted

## 2015-04-11 ENCOUNTER — Ambulatory Visit (INDEPENDENT_AMBULATORY_CARE_PROVIDER_SITE_OTHER): Payer: Medicare Other | Admitting: Family Medicine

## 2015-04-11 ENCOUNTER — Encounter: Payer: Self-pay | Admitting: Family Medicine

## 2015-04-11 VITALS — BP 118/84 | HR 72 | Temp 98.1°F | Wt 284.8 lb

## 2015-04-11 DIAGNOSIS — R140 Abdominal distension (gaseous): Secondary | ICD-10-CM | POA: Diagnosis not present

## 2015-04-11 DIAGNOSIS — G4733 Obstructive sleep apnea (adult) (pediatric): Secondary | ICD-10-CM | POA: Diagnosis not present

## 2015-04-11 DIAGNOSIS — I10 Essential (primary) hypertension: Secondary | ICD-10-CM

## 2015-04-11 DIAGNOSIS — E118 Type 2 diabetes mellitus with unspecified complications: Secondary | ICD-10-CM

## 2015-04-11 DIAGNOSIS — F410 Panic disorder [episodic paroxysmal anxiety] without agoraphobia: Secondary | ICD-10-CM | POA: Diagnosis not present

## 2015-04-11 DIAGNOSIS — R0600 Dyspnea, unspecified: Secondary | ICD-10-CM

## 2015-04-11 MED ORDER — HYDROXYZINE HCL 25 MG PO TABS: 25.0000 mg | ORAL_TABLET | Freq: Two times a day (BID) | ORAL | Status: DC | PRN

## 2015-04-11 MED ORDER — GLIMEPIRIDE 1 MG PO TABS: 1.0000 mg | ORAL_TABLET | Freq: Every day | ORAL | Status: DC

## 2015-04-11 NOTE — Assessment & Plan Note (Addendum)
Continue sertraline 100mg  daily, add hydroxyzine 25mg  prn. Sedation precautions discussed

## 2015-04-11 NOTE — Progress Notes (Signed)
Pre visit review using our clinic review tool, if applicable. No additional management support is needed unless otherwise documented below in the visit note. 

## 2015-04-11 NOTE — Assessment & Plan Note (Addendum)
Hold metformin, start amaryl 1mg  daily.  Check labs next visit.

## 2015-04-11 NOTE — Assessment & Plan Note (Signed)
Encouraged CPAP use. Not using.

## 2015-04-11 NOTE — Progress Notes (Signed)
BP 118/84 mmHg  Pulse 72  Temp(Src) 98.1 F (36.7 C) (Oral)  Wt 284 lb 12 oz (129.162 kg)  SpO2 97%   CC: dysphagia, dspnea, gassiness  Subjective:    Patient ID: [name], male    DOB: [birth year], 59 y.o.   MRN: [id number]  HPI: [name] is a 59 y.o. male presenting on 04/11/2015 for Shortness of Breath; Gas; and Dysphagia   Noticing worsening gas that is painful. At times to point of dyspnea. Waking up with increased phlegm that he feels comes from lungs. Avoids gas producing foods. Last viist we recommended GasX and probiotic - did not try probiotic. instead stated mylanta. He is on metformin '500mg'$  nightly (longstanding). Gas has led to feeling short-winded - unable to take a deep breath. Some mild dysphagia "throat feels thick" at onset of swallow. Increased PNdrainage. Noticing increasing dyspnea with exertion. Hasn't noticed any specific foods triggering gassiness. Lost appetite. Denies any GERD sxs or significant cough.   Increasing panic attacks due to worsening dyspnea.   No increased wheezing or significant cough, fevers/chills, leg swelling, palpitations or diarrhea.   Lab Results  Component Value Date   HGBA1C 7.0* 10/09/2014     Known dilated cardiomyopathy and systolic CHF. Also known atrial fibrillation. Last saw Dr Nehemiah Massed 05/2014. Takes lasix '20mg'$  daily, taking carvedilol only once daily.   Quit smoking 6 mo ago.  Relevant past medical, surgical, family and social history reviewed and updated as indicated. Interim medical history since our last visit reviewed. Allergies and medications reviewed and updated. Current Outpatient Prescriptions on File Prior to Visit  Medication Sig  . ACCU-CHEK FASTCLIX LANCETS MISC Check blood sugar once daily and as instructed. Dx 250.00  . aspirin EC 81 MG tablet Take 81 mg by mouth daily.  . Blood Glucose Monitoring Suppl (BLOOD GLUCOSE MONITOR SYSTEM) W/DEVICE KIT by Does not apply route. Use to check sugar  once daily and as needed Dx: E11.9 **ONE TOUCH VERIO**  . carvedilol (COREG) 6.25 MG tablet TAKE 1 TABLET BY MOUTH 2 TIMES DAILY WITH A MEAL.  . fluticasone (FLONASE) 50 MCG/ACT nasal spray Place 2 sprays into both nostrils daily.  . furosemide (LASIX) 20 MG tablet TAKE 1 TABLET (20 MG TOTAL) BY MOUTH 2 (TWO) TIMES DAILY AS NEEDED.  Marland Kitchen glucose blood test strip 1 each by Other route as directed. Use to check sugar once daily and as needed. Dx:E11.9  **ONE TOUCH VERIO**  . metFORMIN (GLUCOPHAGE) 500 MG tablet Take one tablet by mouth every evening with dinner.  . Omega-3 Fatty Acids (FISH OIL) 1000 MG CAPS Take 2 capsules (2,000 mg total) by mouth daily.  . potassium chloride (KLOR-CON 10) 10 MEQ tablet TAKE 1 TABLET (10 MEQ TOTAL) BY MOUTH DAILY.  Marland Kitchen sertraline (ZOLOFT) 100 MG tablet Take 1 tablet (100 mg total) by mouth daily.  . simvastatin (ZOCOR) 20 MG tablet Take 1 tablet (20 mg total) by mouth every evening.   No current facility-administered medications on file prior to visit.    Review of Systems Per HPI unless specifically indicated in ROS section     Objective:    BP 118/84 mmHg  Pulse 72  Temp(Src) 98.1 F (36.7 C) (Oral)  Wt 284 lb 12 oz (129.162 kg)  SpO2 97%  Wt Readings from Last 3 Encounters:  04/11/15 284 lb 12 oz (129.162 kg)  02/21/15 279 lb 8 oz (126.78 kg)  02/06/15 277 lb 8 oz (125.873 kg)    Physical  Exam  Constitutional: He appears well-developed and well-nourished. No distress.  HENT:  Mouth/Throat: Oropharynx is clear and moist. No oropharyngeal exudate.  Cardiovascular: Normal rate, regular rhythm, normal heart sounds and intact distal pulses.   No murmur heard. Pulmonary/Chest: Effort normal and breath sounds normal. No respiratory distress. He has no wheezes. He has no rales.  Musculoskeletal: He exhibits edema (tr).  Skin: Skin is warm and dry. No rash noted.  Psychiatric: He has a normal mood and affect.  Nursing note and vitals  reviewed.  Results for orders placed or performed in visit on 02/06/15  Comprehensive metabolic panel  Result Value Ref Range   Sodium 138 135 - 145 mEq/L   Potassium 4.8 3.5 - 5.1 mEq/L   Chloride 100 96 - 112 mEq/L   CO2 32 19 - 32 mEq/L   Glucose, Bld 171 (H) 70 - 99 mg/dL   BUN 6 6 - 23 mg/dL   Creatinine, Ser 0.75 0.40 - 1.50 mg/dL   Total Bilirubin 0.4 0.2 - 1.2 mg/dL   Alkaline Phosphatase 70 39 - 117 U/L   AST 17 0 - 37 U/L   ALT 21 0 - 53 U/L   Total Protein 7.1 6.0 - 8.3 g/dL   Albumin 4.1 3.5 - 5.2 g/dL   Calcium 9.4 8.4 - 10.5 mg/dL   GFR 113.14 >60.00 mL/min  CBC with Differential/Platelet  Result Value Ref Range   WBC 11.2 (H) 4.0 - 10.5 K/uL   RBC 5.24 4.22 - 5.81 Mil/uL   Hemoglobin 16.2 13.0 - 17.0 g/dL   HCT 48.3 39.0 - 52.0 %   MCV 92.2 78.0 - 100.0 fl   MCHC 33.5 30.0 - 36.0 g/dL   RDW 13.4 11.5 - 15.5 %   Platelets 264.0 150.0 - 400.0 K/uL   Neutrophils Relative % 67.0 43.0 - 77.0 %   Lymphocytes Relative 22.6 12.0 - 46.0 %   Monocytes Relative 7.9 3.0 - 12.0 %   Eosinophils Relative 2.1 0.0 - 5.0 %   Basophils Relative 0.4 0.0 - 3.0 %   Neutro Abs 7.5 1.4 - 7.7 K/uL   Lymphs Abs 2.5 0.7 - 4.0 K/uL   Monocytes Absolute 0.9 0.1 - 1.0 K/uL   Eosinophils Absolute 0.2 0.0 - 0.7 K/uL   Basophils Absolute 0.0 0.0 - 0.1 K/uL      Assessment & Plan:   Problem List Items Addressed This Visit    Panic attacks    Continue sertraline '100mg'$  daily, add hydroxyzine '25mg'$  prn. Sedation precautions discussed      Relevant Medications   hydrOXYzine (ATARAX/VISTARIL) 25 MG tablet   Obstructive sleep apnea    Encouraged CPAP use. Not using.       Hypertension    Chronic, stable. Continue current regimen.      Gassiness - Primary    ?metformin related - will do 2 wk hold of metformin and monitor for improvement.  rec continue gas X rec start philips colon health.  No GERD sxs. No red flags. Update with effect.      Dyspnea    Ambulatory pulse ox  normal. Prior EKG stable. Likely related to anxiety and gassiness. See below      Diabetes type 2, controlled ([place])    Hold metformin, start amaryl '1mg'$  daily.  Check labs next visit.      Relevant Medications   glimepiride (AMARYL) 1 MG tablet       Follow up plan: Return in about 3 months (around 07/10/2015),  or as needed, for follow up visit.

## 2015-04-11 NOTE — Assessment & Plan Note (Signed)
?  metformin related - will do 2 wk hold of metformin and monitor for improvement.  rec continue gas X rec start philips colon health.  No GERD sxs. No red flags. Update with effect.

## 2015-04-11 NOTE — Patient Instructions (Addendum)
Hold metformin for 2 weeks and monitor GI symptoms. Start glimepiride 1mg  daily with breakfast (always take with food). Start probiotic - phillips colon health daily.  Ambulatory pulse ox today.  Continue sertraline, trial hydroxyzine 25mg  as needed for anxiety - caution it can make you sleepy.  Return in 3 months for labs and follow up visit

## 2015-04-11 NOTE — Assessment & Plan Note (Signed)
Ambulatory pulse ox normal. Prior EKG stable. Likely related to anxiety and gassiness. See below

## 2015-04-11 NOTE — Assessment & Plan Note (Signed)
Chronic, stable. Continue current regimen. 

## 2015-04-29 ENCOUNTER — Other Ambulatory Visit: Payer: Self-pay | Admitting: Family Medicine

## 2015-04-29 NOTE — Telephone Encounter (Signed)
Ok to refill 

## 2015-06-10 ENCOUNTER — Other Ambulatory Visit: Payer: Self-pay | Admitting: Family Medicine

## 2015-06-10 NOTE — Telephone Encounter (Signed)
Ok to refill 

## 2015-06-24 ENCOUNTER — Telehealth: Payer: Self-pay

## 2015-06-24 NOTE — Telephone Encounter (Signed)
Monica NP with [facility] called pts dip stick on 06/23/15 at 4PM was 4+ glucose. Pt was asymptomatic; no complaints. Pt taking Glimeperide ; pt having GI problems on metformin and was decided by PCP to stop metformin.pt last annual exam on 068/22/16; no future appt scheduled.Monica request info to Dr Darnell Level and then any response be relayed to pt. Monica does not need a cb.

## 2015-06-27 NOTE — Telephone Encounter (Signed)
Lab Results  Component Value Date   HGBA1C 7.0* 10/09/2014  plz call pt - recommend schedule office visit within the next month for 3 mo DM f/u (as asked last visit). Sounds like we need to titrate diabetes meds

## 2015-06-27 NOTE — Telephone Encounter (Signed)
Patient notified and appt scheduled.

## 2015-06-30 ENCOUNTER — Ambulatory Visit (INDEPENDENT_AMBULATORY_CARE_PROVIDER_SITE_OTHER): Payer: Medicare Other | Admitting: Family Medicine

## 2015-06-30 ENCOUNTER — Encounter: Payer: Self-pay | Admitting: Family Medicine

## 2015-06-30 VITALS — BP 128/88 | HR 84 | Temp 97.8°F | Wt 300.5 lb

## 2015-06-30 DIAGNOSIS — E785 Hyperlipidemia, unspecified: Secondary | ICD-10-CM | POA: Diagnosis not present

## 2015-06-30 DIAGNOSIS — I10 Essential (primary) hypertension: Secondary | ICD-10-CM

## 2015-06-30 DIAGNOSIS — F410 Panic disorder [episodic paroxysmal anxiety] without agoraphobia: Secondary | ICD-10-CM

## 2015-06-30 DIAGNOSIS — B353 Tinea pedis: Secondary | ICD-10-CM

## 2015-06-30 DIAGNOSIS — E118 Type 2 diabetes mellitus with unspecified complications: Secondary | ICD-10-CM | POA: Diagnosis not present

## 2015-06-30 DIAGNOSIS — R140 Abdominal distension (gaseous): Secondary | ICD-10-CM

## 2015-06-30 LAB — BASIC METABOLIC PANEL
BUN: 7 mg/dL (ref 6–23)
CALCIUM: 9.3 mg/dL (ref 8.4–10.5)
CO2: 32 meq/L (ref 19–32)
CREATININE: 0.66 mg/dL (ref 0.40–1.50)
Chloride: 97 mEq/L (ref 96–112)
GFR: 130.95 mL/min (ref >60.00)
GLUCOSE: 207 mg/dL — AB (ref 70–99)
Potassium: 4.6 mEq/L (ref 3.5–5.1)
Sodium: 134 mEq/L — ABNORMAL LOW (ref 135–145)

## 2015-06-30 LAB — LDL CHOLESTEROL, DIRECT: Direct LDL: 89 mg/dL

## 2015-06-30 LAB — LIPID PANEL
CHOL/HDL RATIO: 4
Cholesterol: 160 mg/dL (ref 0–200)
HDL: 41.4 mg/dL (ref >39.00)
NONHDL: 118.82
TRIGLYCERIDES: 248 mg/dL — AB (ref 0.0–149.0)
VLDL: 49.6 mg/dL — ABNORMAL HIGH (ref 0.0–40.0)

## 2015-06-30 LAB — MICROALBUMIN / CREATININE URINE RATIO
CREATININE, U: 105.3 mg/dL
MICROALB/CREAT RATIO: 1.3 mg/g (ref 0.0–30.0)
Microalb, Ur: 1.4 mg/dL (ref 0.0–1.9)

## 2015-06-30 LAB — HEMOGLOBIN A1C: Hgb A1c MFr Bld: 8.5 % — ABNORMAL HIGH (ref 4.6–6.5)

## 2015-06-30 NOTE — Assessment & Plan Note (Signed)
Check A1c today. 4+ glu in urine at recent home visit by insurance company. rec restart metformin + continue amaryl. Check a1c today. Declines DSME today.

## 2015-06-30 NOTE — Patient Instructions (Addendum)
Return in 4 months for medicare wellness visit Schedule eye exam as you're due.  Try clotrimazole to feet daily - can take 4+ weeks to see improvement.  Try amaryl + metformin daily.

## 2015-06-30 NOTE — Assessment & Plan Note (Signed)
Off sertraline (tapered off) only on hydroxyzine and doing well.

## 2015-06-30 NOTE — Assessment & Plan Note (Signed)
Chronic, stable. Continue current regimen. 

## 2015-06-30 NOTE — Assessment & Plan Note (Signed)
Discussed healthy diet and lifestyle changes to affect sustainable weight loss  

## 2015-06-30 NOTE — Progress Notes (Signed)
BP 128/88 mmHg  Pulse 84  Temp(Src) 97.8 F (36.6 C) (Oral)  Wt 300 lb 8 oz (136.306 kg)   CC: f/u visit  Subjective:    Patient ID: [name], male    DOB: [birth year], 60 y.o.   MRN: [id number]  HPI: KURTISS WENCE is a 60 y.o. male presenting on 06/30/2015 for Follow-up   Here today because recent house calls NP found 4+ glucosuria. Last visit 03/2015 we asked him to do trial off metformin x 2wks to see if gassiness improved. He has remained off metformin and has only been taking amaryl 22m daily. Sugars ranging 140-220.   16lb weight gain noted. Attributes to inactivity and holiday eating. Planning on being more active now.   Panic attacks - tapered off sertraline 1080mdaily only using hydroxyzine 2554mrn.  OSA not using CPAP.   Relevant past medical, surgical, family and social history reviewed and updated as indicated. Interim medical history since our last visit reviewed. Allergies and medications reviewed and updated. Current Outpatient Prescriptions on File Prior to Visit  Medication Sig  . ACCU-CHEK FASTCLIX LANCETS MISC Check blood sugar once daily and as instructed. Dx 250.00  . aspirin EC 81 MG tablet Take 81 mg by mouth daily.  . Blood Glucose Monitoring Suppl (BLOOD GLUCOSE MONITOR SYSTEM) W/DEVICE KIT by Does not apply route. Use to check sugar once daily and as needed Dx: E11.9 **ONE TOUCH VERIO**  . carvedilol (COREG) 6.25 MG tablet TAKE 1 TABLET BY MOUTH 2 TIMES DAILY WITH A MEAL.  . fluticasone (FLONASE) 50 MCG/ACT nasal spray Place 2 sprays into both nostrils daily.  . furosemide (LASIX) 20 MG tablet TAKE 1 TABLET (20 MG TOTAL) BY MOUTH 2 (TWO) TIMES DAILY AS NEEDED.  . gMarland Kitchenimepiride (AMARYL) 1 MG tablet Take 1 tablet (1 mg total) by mouth daily with breakfast.  . glucose blood test strip 1 each by Other route as directed. Use to check sugar once daily and as needed. Dx:E11.9  **ONE TOUCH VERIO**  . hydrOXYzine (ATARAX/VISTARIL) 25 MG tablet TAKE 1  TABLET (25 MG TOTAL) BY MOUTH 2 (TWO) TIMES DAILY AS NEEDED FOR ANXIETY.  . Omega-3 Fatty Acids (FISH OIL) 1000 MG CAPS Take 2 capsules (2,000 mg total) by mouth daily.  . potassium chloride (KLOR-CON 10) 10 MEQ tablet TAKE 1 TABLET (10 MEQ TOTAL) BY MOUTH DAILY.  . simvastatin (ZOCOR) 20 MG tablet Take 1 tablet (20 mg total) by mouth every evening.   No current facility-administered medications on file prior to visit.    Review of Systems Per HPI unless specifically indicated in ROS section     Objective:    BP 128/88 mmHg  Pulse 84  Temp(Src) 97.8 F (36.6 C) (Oral)  Wt 300 lb 8 oz (136.306 kg)  Wt Readings from Last 3 Encounters:  06/30/15 300 lb 8 oz (136.306 kg)  04/11/15 284 lb 12 oz (129.162 kg)  02/21/15 279 lb 8 oz (126.78 kg)    Physical Exam  Constitutional: He appears well-developed and well-nourished. No distress.  HENT:  Head: Normocephalic and atraumatic.  Right Ear: External ear normal.  Left Ear: External ear normal.  Nose: Nose normal.  Mouth/Throat: Oropharynx is clear and moist. No oropharyngeal exudate.  Eyes: Conjunctivae and EOM are normal. Pupils are equal, round, and reactive to light. No scleral icterus.  Neck: Normal range of motion. Neck supple.  Cardiovascular: Normal rate, regular rhythm, normal heart sounds and intact distal pulses.   No murmur  heard. Pulmonary/Chest: Effort normal and breath sounds normal. No respiratory distress. He has no wheezes. He has no rales.  Musculoskeletal: He exhibits no edema.  See HPI for foot exam if done  Lymphadenopathy:    He has no cervical adenopathy.  Skin: Skin is warm and dry. No rash noted.  Psychiatric: He has a normal mood and affect.  Nursing note and vitals reviewed.  Results for orders placed or performed in visit on 02/06/15  Comprehensive metabolic panel  Result Value Ref Range   Sodium 138 135 - 145 mEq/L   Potassium 4.8 3.5 - 5.1 mEq/L   Chloride 100 96 - 112 mEq/L   CO2 32 19 - 32  mEq/L   Glucose, Bld 171 (H) 70 - 99 mg/dL   BUN 6 6 - 23 mg/dL   Creatinine, Ser 0.75 0.40 - 1.50 mg/dL   Total Bilirubin 0.4 0.2 - 1.2 mg/dL   Alkaline Phosphatase 70 39 - 117 U/L   AST 17 0 - 37 U/L   ALT 21 0 - 53 U/L   Total Protein 7.1 6.0 - 8.3 g/dL   Albumin 4.1 3.5 - 5.2 g/dL   Calcium 9.4 8.4 - 10.5 mg/dL   GFR 113.14 >60.00 mL/min  CBC with Differential/Platelet  Result Value Ref Range   WBC 11.2 (H) 4.0 - 10.5 K/uL   RBC 5.24 4.22 - 5.81 Mil/uL   Hemoglobin 16.2 13.0 - 17.0 g/dL   HCT 48.3 39.0 - 52.0 %   MCV 92.2 78.0 - 100.0 fl   MCHC 33.5 30.0 - 36.0 g/dL   RDW 13.4 11.5 - 15.5 %   Platelets 264.0 150.0 - 400.0 K/uL   Neutrophils Relative % 67.0 43.0 - 77.0 %   Lymphocytes Relative 22.6 12.0 - 46.0 %   Monocytes Relative 7.9 3.0 - 12.0 %   Eosinophils Relative 2.1 0.0 - 5.0 %   Basophils Relative 0.4 0.0 - 3.0 %   Neutro Abs 7.5 1.4 - 7.7 K/uL   Lymphs Abs 2.5 0.7 - 4.0 K/uL   Monocytes Absolute 0.9 0.1 - 1.0 K/uL   Eosinophils Absolute 0.2 0.0 - 0.7 K/uL   Basophils Absolute 0.0 0.0 - 0.1 K/uL      Assessment & Plan:   Problem List Items Addressed This Visit    Tinea pedis    Discussed clotrimazole use      Panic attacks    Off sertraline (tapered off) only on hydroxyzine and doing well.       Morbid obesity ([place])    Discussed healthy diet and lifestyle changes to affect sustainable weight loss.      Relevant Medications   metFORMIN (GLUCOPHAGE) 500 MG tablet   Hypertension    Chronic, stable. Continue current regimen.      Hyperlipidemia    Continue fish oil and simvastatin.      Relevant Orders   Lipid panel   Gassiness    Improved off metformin and on probiotic. rec re trial metformin given uncontrolled sugars.      Diabetes type 2, controlled ([place]) - Primary    Check A1c today. 4+ glu in urine at recent home visit by insurance company. rec restart metformin + continue amaryl. Check a1c today. Declines DSME today.      Relevant  Medications   metFORMIN (GLUCOPHAGE) 500 MG tablet   Other Relevant Orders   Basic metabolic panel   Hemoglobin A1c   Microalbumin / creatinine urine ratio  Follow up plan: Return in about 4 months (around 10/30/2015) for medicare wellness visit.

## 2015-06-30 NOTE — Progress Notes (Signed)
Pre visit review using our clinic review tool, if applicable. No additional management support is needed unless otherwise documented below in the visit note. 

## 2015-06-30 NOTE — Assessment & Plan Note (Signed)
Continue fish oil and simvastatin.

## 2015-06-30 NOTE — Assessment & Plan Note (Signed)
Improved off metformin and on probiotic. rec re trial metformin given uncontrolled sugars.

## 2015-06-30 NOTE — Assessment & Plan Note (Signed)
Discussed clotrimazole use

## 2015-07-01 ENCOUNTER — Telehealth: Payer: Self-pay | Admitting: Family Medicine

## 2015-07-01 NOTE — Telephone Encounter (Signed)
In your IN box for completion.  

## 2015-07-01 NOTE — Telephone Encounter (Signed)
Pt dropped off [facility] handicap form. Placing in rx tower. Please call [phone] when ready to pick up  Thanks

## 2015-07-02 NOTE — Telephone Encounter (Signed)
Filled and in Kim's box. 

## 2015-07-02 NOTE — Telephone Encounter (Signed)
Patient notified and form placed up front for pick up. 

## 2015-07-03 ENCOUNTER — Encounter: Payer: Self-pay | Admitting: *Deleted

## 2015-07-08 ENCOUNTER — Other Ambulatory Visit: Payer: Self-pay | Admitting: Family Medicine

## 2015-07-18 ENCOUNTER — Other Ambulatory Visit: Payer: Self-pay | Admitting: Family Medicine

## 2015-08-11 ENCOUNTER — Telehealth: Payer: Self-pay | Admitting: Family Medicine

## 2015-08-11 MED ORDER — GLIMEPIRIDE 1 MG PO TABS: 1.0000 mg | ORAL_TABLET | Freq: Every day | ORAL | Status: DC

## 2015-08-11 NOTE — Telephone Encounter (Signed)
Santiago Glad from [facility] calling to see about getting a refill for Glimepiride 1mg  for patient  90 day refill needed   [facility] [phone]

## 2015-08-11 NOTE — Telephone Encounter (Signed)
sent in

## 2015-08-12 ENCOUNTER — Other Ambulatory Visit: Payer: Self-pay | Admitting: *Deleted

## 2015-08-19 ENCOUNTER — Other Ambulatory Visit: Payer: Self-pay | Admitting: Family Medicine

## 2015-08-28 ENCOUNTER — Other Ambulatory Visit: Payer: Self-pay | Admitting: Family Medicine

## 2015-10-02 ENCOUNTER — Ambulatory Visit (INDEPENDENT_AMBULATORY_CARE_PROVIDER_SITE_OTHER): Payer: Medicare Other | Admitting: Family Medicine

## 2015-10-02 ENCOUNTER — Encounter: Payer: Self-pay | Admitting: Family Medicine

## 2015-10-02 VITALS — BP 142/82 | HR 70 | Temp 97.9°F | Wt 303.0 lb

## 2015-10-02 DIAGNOSIS — I5022 Chronic systolic (congestive) heart failure: Secondary | ICD-10-CM | POA: Diagnosis not present

## 2015-10-02 DIAGNOSIS — I5032 Chronic diastolic (congestive) heart failure: Secondary | ICD-10-CM | POA: Insufficient documentation

## 2015-10-02 DIAGNOSIS — J209 Acute bronchitis, unspecified: Secondary | ICD-10-CM

## 2015-10-02 MED ORDER — AZITHROMYCIN 250 MG PO TABS: ORAL_TABLET | ORAL | Status: DC

## 2015-10-02 NOTE — Assessment & Plan Note (Addendum)
Anticipate mild acute CHF exacerbation from worsened pedal edema, dyspnea, and orthopnea. Will treat with increase in lasix to 40mg /20mg  for 1 week then return to 20mg  BID dosing. Update if not improving with treatment for further evaluation (CXR, BNP). No anginal chest pain today. No recent evaluation by cardiology - suggested he call and schedule f/u with Dr Nehemiah Massed.

## 2015-10-02 NOTE — Patient Instructions (Addendum)
I think you have bronchitis and slight flare of CHF Increase lasix to 2 tablets in am and 1 in pm for next 1 week then return to twice daily.  Take antibiotic sent to pharmacy today. Let us know if not better with above.  If worsening breathing despite this, please return for recheck or seek urgent care.  We are due for follow up with Dr Nehemiah Massed - call to schedule check up.

## 2015-10-02 NOTE — Assessment & Plan Note (Signed)
Anticipate developing acute bronchitis, no signs of PNA today. Will treat aggressively given comorbidities with zpack. Known h/o atrial flutter s/p ablation but heart sounds regular today.

## 2015-10-02 NOTE — Progress Notes (Signed)
Pre visit review using our clinic review tool, if applicable. No additional management support is needed unless otherwise documented below in the visit note. 

## 2015-10-02 NOTE — Progress Notes (Signed)
BP 142/82 mmHg  Pulse 70  Temp(Src) 97.9 F (36.6 C) (Oral)  Wt 303 lb (137.44 kg)  SpO2 94% RA  CC: malaise  Subjective:    Patient ID: [name], male    DOB: 1955-09-21, 60 y.o.   MRN: [id number]  HPI: [name] is a 60 y.o. male presenting on 10/02/2015 for Not feeling Well; Shortness of Breath; Chest Pain; Fatigue; and no appetite   Presents alone today - not feeling well for last 5-6 days. Started with cough occasionally productive, worsening dyspnea with wheeze, fatigue, chest pains described as soreness, no sharp pain or burning pain, not tightness/pressure. Decreased appetite. + orthopnea. Sinuses acting up right now - very congested. He has been taking one OTC allergy pill daily with tylenol - which has significantly helped. Increased pedal edema recently.  Feels like he felt when he had walking pneumonia.  No fevers/chills, ear pain, ST, PNdrainage. Headaches.  Ex smoker. Significant other smokes at home.  No sick contacts at home.  No h/o asthma. Known COPD, ex smoker.   Took extra lasix last few days which has increased UOP and helped leg welling but dyspnea persists. Has been taking 68m TID.  Relevant past medical, surgical, family and social history reviewed and updated as indicated. Interim medical history since our last visit reviewed. Allergies and medications reviewed and updated. Current Outpatient Prescriptions on File Prior to Visit  Medication Sig  . ACCU-CHEK FASTCLIX LANCETS MISC Check blood sugar once daily and as instructed. Dx 250.00  . aspirin EC 81 MG tablet Take 81 mg by mouth daily.  . Blood Glucose Monitoring Suppl (BLOOD GLUCOSE MONITOR SYSTEM) W/DEVICE KIT by Does not apply route. Use to check sugar once daily and as needed Dx: E11.9 **ONE TOUCH VERIO**  . carvedilol (COREG) 6.25 MG tablet TAKE 1 TABLET BY MOUTH 2 TIMES DAILY WITH A MEAL.  . fluticasone (FLONASE) 50 MCG/ACT nasal spray Place 2 sprays into both nostrils daily.  .  furosemide (LASIX) 20 MG tablet TAKE 1 TABLET (20 MG TOTAL) BY MOUTH 2 (TWO) TIMES DAILY AS NEEDED.  .Marland Kitchenglimepiride (AMARYL) 1 MG tablet Take 1 tablet (1 mg total) by mouth daily with breakfast.  . glucose blood (ONETOUCH VERIO) test strip Use to check sugar once daily and as needed. Dx: E11.9  . hydrOXYzine (ATARAX/VISTARIL) 25 MG tablet TAKE 1 TABLET (25 MG TOTAL) BY MOUTH 2 (TWO) TIMES DAILY AS NEEDED FOR ANXIETY.  . metFORMIN (GLUCOPHAGE) 500 MG tablet Take 1 tablet (500 mg total) by mouth daily with breakfast.  . metFORMIN (GLUCOPHAGE) 500 MG tablet Take 1 tablet (500 mg total) by mouth daily with breakfast.  . Omega-3 Fatty Acids (FISH OIL) 1000 MG CAPS Take 2 capsules (2,000 mg total) by mouth daily.  . potassium chloride (KLOR-CON 10) 10 MEQ tablet TAKE 1 TABLET (10 MEQ TOTAL) BY MOUTH DAILY.  . Probiotic Product (PROBIOTIC DAILY PO) Take 1 capsule by mouth daily.  . simvastatin (ZOCOR) 20 MG tablet TAKE 1 TABLET (20 MG TOTAL) BY MOUTH EVERY EVENING.   No current facility-administered medications on file prior to visit.    Review of Systems Per HPI unless specifically indicated in ROS section     Objective:    BP 142/82 mmHg  Pulse 70  Temp(Src) 97.9 F (36.6 C) (Oral)  Wt 303 lb (137.44 kg)  SpO2 94%  Wt Readings from Last 3 Encounters:  10/02/15 303 lb (137.44 kg)  06/30/15 300 lb 8 oz (136.306 kg)  04/11/15 284 lb 12 oz (129.162 kg)    Physical Exam  Constitutional: He appears well-developed and well-nourished. No distress.  HENT:  Head: Normocephalic and atraumatic.  Right Ear: Hearing, tympanic membrane, external ear and ear canal normal.  Left Ear: Hearing, tympanic membrane, external ear and ear canal normal.  Nose: Nose normal. No mucosal edema or rhinorrhea. Right sinus exhibits no maxillary sinus tenderness and no frontal sinus tenderness. Left sinus exhibits no maxillary sinus tenderness and no frontal sinus tenderness.  Mouth/Throat: Uvula is midline,  oropharynx is clear and moist and mucous membranes are normal. No oropharyngeal exudate, posterior oropharyngeal edema, posterior oropharyngeal erythema or tonsillar abscesses.  Mild nasal injection  Eyes: Conjunctivae and EOM are normal. Pupils are equal, round, and reactive to light. No scleral icterus.  Neck: Normal range of motion. Neck supple. No thyromegaly present.  Cardiovascular: Normal rate, regular rhythm, normal heart sounds and intact distal pulses.   No murmur heard. Pulmonary/Chest: Effort normal and breath sounds normal. No respiratory distress. He has no wheezes. He has no rales.  Coarse LLL  Musculoskeletal: He exhibits edema (tr-1+ BLE).  Lymphadenopathy:    He has no cervical adenopathy.  Skin: Skin is warm and dry. No rash noted.  Nursing note and vitals reviewed.  Results for orders placed or performed in visit on 26/71/24  Basic metabolic panel  Result Value Ref Range   Sodium 134 (L) 135 - 145 mEq/L   Potassium 4.6 3.5 - 5.1 mEq/L   Chloride 97 96 - 112 mEq/L   CO2 32 19 - 32 mEq/L   Glucose, Bld 207 (H) 70 - 99 mg/dL   BUN 7 6 - 23 mg/dL   Creatinine, Ser 0.66 0.40 - 1.50 mg/dL   Calcium 9.3 8.4 - 10.5 mg/dL   GFR 130.95 >60.00 mL/min  Hemoglobin A1c  Result Value Ref Range   Hgb A1c MFr Bld 8.5 (H) 4.6 - 6.5 %  Lipid panel  Result Value Ref Range   Cholesterol 160 0 - 200 mg/dL   Triglycerides 248.0 (H) 0.0 - 149.0 mg/dL   HDL 41.40 >39.00 mg/dL   VLDL 49.6 (H) 0.0 - 40.0 mg/dL   Total CHOL/HDL Ratio 4    NonHDL 118.82   Microalbumin / creatinine urine ratio  Result Value Ref Range   Microalb, Ur 1.4 0.0 - 1.9 mg/dL   Creatinine,U 105.3 mg/dL   Microalb Creat Ratio 1.3 0.0 - 30.0 mg/g  LDL cholesterol, direct  Result Value Ref Range   Direct LDL 89.0 mg/dL      Assessment & Plan:   Problem List Items Addressed This Visit    Chronic systolic CHF (congestive heart failure) ([facility])    Anticipate mild acute CHF exacerbation from worsened pedal  edema, dyspnea, and orthopnea. Will treat with increase in lasix to 39m/20mg for 1 week then return to 262mBID dosing. Update if not improving with treatment for further evaluation (CXR, BNP). No anginal chest pain today. No recent evaluation by cardiology - suggested he call and schedule f/u with Dr KoNehemiah Massed      Acute bronchitis - Primary    Anticipate developing acute bronchitis, no signs of PNA today. Will treat aggressively given comorbidities with zpack. Known h/o atrial flutter s/p ablation but heart sounds regular today.           Follow up plan: Return in about 4 weeks (around 10/30/2015), or if symptoms worsen or fail to improve, for medicare wellness visit.  JaRia BushMD

## 2015-11-06 ENCOUNTER — Encounter: Payer: Self-pay | Admitting: Family Medicine

## 2015-11-06 ENCOUNTER — Ambulatory Visit (INDEPENDENT_AMBULATORY_CARE_PROVIDER_SITE_OTHER): Payer: Medicare Other | Admitting: Family Medicine

## 2015-11-06 VITALS — BP 130/84 | HR 77 | Temp 98.1°F | Ht 69.5 in | Wt 300.0 lb

## 2015-11-06 DIAGNOSIS — J209 Acute bronchitis, unspecified: Secondary | ICD-10-CM

## 2015-11-06 DIAGNOSIS — I5022 Chronic systolic (congestive) heart failure: Secondary | ICD-10-CM | POA: Diagnosis not present

## 2015-11-06 NOTE — Patient Instructions (Addendum)
When you return from [place], return stool kit.  Enjoy your trip! Return as needed or in next 2 months for medicare wellness visit

## 2015-11-06 NOTE — Assessment & Plan Note (Signed)
Resolved after zpack.

## 2015-11-06 NOTE — Assessment & Plan Note (Signed)
Mild crackles but overall no dyspnea. Continue lasix 20mg  daily with extra tablet PRN

## 2015-11-06 NOTE — Progress Notes (Signed)
BP 130/84 mmHg  Pulse 77  Temp(Src) 98.1 F (36.7 C) (Oral)  Ht 5' 9.5" (1.765 m)  Wt 300 lb (136.079 kg)  BMI 43.68 kg/m2  SpO2 98%   CC: medicare wellness visit  Subjective:    Patient ID: [name], male    DOB: [birth year], 60 y.o.   MRN: [id number]  HPI: [name] is a 60 y.o. male presenting on 11/06/2015 for Medicare Wellness   Actually requests we defer medicare wellness visit today. He states he is about to leave for trip to [place] to visit with family over weekend. Driving up. Discussed intermittent breaks.   Bronchitis and acute CHF exacerbation has improved since last month with treatment. Has f/u with Dr Nehemiah Massed next month.   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 No injury Denies depression/sadness/anhedonia.  Preventative: Colon cancer screening - has not return last iFOB.  Prostate cancer screening - discussed, declines for now.  Flu shot - 05/2014  Tetanus - thinks 2008  Pneumovax per patient done in hospital 2014  Advanced directives - has discussed with family. No children. HCProxy would be Dawn, girlfriend as well as sister (who lives in [place]). Does not want prolonged life support. Packet provided today.  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 Interim medical history since our last visit reviewed. Allergies and medications reviewed and updated. Current Outpatient Prescriptions on File Prior to Visit  Medication Sig  . ACCU-CHEK FASTCLIX LANCETS MISC Check blood sugar once daily and as instructed. Dx 250.00  . aspirin EC 81 MG tablet Take 81 mg by  mouth daily.  . Blood Glucose Monitoring Suppl (BLOOD GLUCOSE MONITOR SYSTEM) W/DEVICE KIT by Does not apply route. Use to check sugar once daily and as needed Dx: E11.9 **ONE TOUCH VERIO**  . carvedilol (COREG) 6.25 MG tablet TAKE 1 TABLET BY MOUTH 2 TIMES DAILY WITH A MEAL.  . fluticasone (FLONASE) 50 MCG/ACT nasal spray Place 2 sprays into both nostrils daily.  . furosemide (LASIX) 20 MG tablet TAKE 1 TABLET (20 MG TOTAL) BY MOUTH 2 (TWO) TIMES DAILY AS NEEDED.  Marland Kitchen glimepiride (AMARYL) 1 MG tablet Take 1 tablet (1 mg total) by mouth daily with breakfast.  . glucose blood (ONETOUCH VERIO) test strip Use to check sugar once daily and as needed. Dx: E11.9  . hydrOXYzine (ATARAX/VISTARIL) 25 MG tablet TAKE 1 TABLET (25 MG TOTAL) BY MOUTH 2 (TWO) TIMES DAILY AS NEEDED FOR ANXIETY.  . metFORMIN (GLUCOPHAGE) 500 MG tablet Take 1 tablet (500 mg total) by mouth daily with breakfast.  . Omega-3 Fatty Acids (FISH OIL) 1000 MG CAPS Take 2 capsules (2,000 mg total) by mouth daily.  . Probiotic Product (PROBIOTIC DAILY PO) Take by mouth.  . simvastatin (ZOCOR) 20 MG tablet TAKE 1 TABLET (20 MG TOTAL) BY MOUTH EVERY EVENING.  Marland Kitchen potassium chloride (KLOR-CON 10) 10 MEQ tablet TAKE 1 TABLET (10 MEQ TOTAL) BY MOUTH DAILY. (Patient not taking: Reported on 11/06/2015)   No current facility-administered medications on file prior to visit.    Review of Systems Per HPI unless specifically indicated in ROS section     Objective:  BP 130/84 mmHg  Pulse 77  Temp(Src) 98.1 F (36.7 C) (Oral)  Ht 5' 9.5" (1.765 m)  Wt 300 lb (136.079 kg)  BMI 43.68 kg/m2  SpO2 98%  Wt Readings from Last 3 Encounters:  11/06/15 300 lb (136.079 kg)  10/02/15 303 lb (137.44 kg)  06/30/15 300 lb 8 oz (136.306 kg)    Physical Exam  Constitutional: He appears well-developed and well-nourished. No distress.  HENT:  Mouth/Throat: Oropharynx is clear and moist. No oropharyngeal exudate.  Cardiovascular: Normal rate, regular  rhythm, normal heart sounds and intact distal pulses.   No murmur heard. Pulmonary/Chest: Effort normal and breath sounds normal. No respiratory distress. He has no wheezes. He has no rales.  bilateral crackles  Psychiatric: He has a normal mood and affect.  Nursing note and vitals reviewed.      Assessment & Plan:   Problem List Items Addressed This Visit    Chronic systolic CHF (congestive heart failure) (HCC)    Mild crackles but overall no dyspnea. Continue lasix 58m daily with extra tablet PRN       RESOLVED: Acute bronchitis - Primary    Resolved after zpack.          Follow up plan: Return in about 2 months (around 01/07/2016) for medicare wellness visit.  JRia Bush MD

## 2015-11-06 NOTE — Progress Notes (Signed)
Pre visit review using our clinic review tool, if applicable. No additional management support is needed unless otherwise documented below in the visit note. 

## 2015-11-28 ENCOUNTER — Other Ambulatory Visit: Payer: Self-pay | Admitting: Family Medicine

## 2015-12-24 ENCOUNTER — Other Ambulatory Visit: Payer: Self-pay | Admitting: Family Medicine

## 2015-12-25 NOTE — Telephone Encounter (Signed)
Ok to refill? Last filled 11/28/15 #40 0RF

## 2015-12-26 NOTE — Telephone Encounter (Signed)
Rx sent electronically.  

## 2016-01-02 ENCOUNTER — Ambulatory Visit: Payer: Self-pay

## 2016-01-09 ENCOUNTER — Encounter: Payer: Self-pay | Admitting: Family Medicine

## 2016-01-20 ENCOUNTER — Other Ambulatory Visit: Payer: Self-pay | Admitting: Family Medicine

## 2016-01-20 NOTE — Telephone Encounter (Signed)
Ok to refill? Last filled 12/26/15 #40 0RF 

## 2016-03-21 ENCOUNTER — Other Ambulatory Visit: Payer: Self-pay | Admitting: Family Medicine

## 2016-03-22 NOTE — Telephone Encounter (Signed)
Refill sent per [facility] refill protocol/SLS  

## 2016-04-15 ENCOUNTER — Other Ambulatory Visit: Payer: Self-pay | Admitting: Family Medicine

## 2016-04-15 NOTE — Telephone Encounter (Signed)
Ok to refill? Last filled 01/22/16 #40 0RF

## 2016-05-01 ENCOUNTER — Other Ambulatory Visit: Payer: Self-pay | Admitting: Family Medicine

## 2016-05-05 ENCOUNTER — Telehealth: Payer: Self-pay | Admitting: Family Medicine

## 2016-05-05 NOTE — Telephone Encounter (Signed)
[place] Patient Name: [name] [facility] DOB: [birth year] Initial Comment Caller states he is having a lot of pain in lower right side of his back in kidney area, but he suffers from terrible gas pains that travels into chest and causes difficulty breathing. He is noticing a lot of this gas travelling around and now having right shoulder pain as well. He has had heart issues in the past. No current chest pains or difficulty breathing. Nurse Assessment Nurse: Adella Nissen, RN, Leitha Schuller Date/Time ([place] Time): 05/05/2016 3:22:59 PM Confirm and document reason for call. If symptomatic, describe symptoms. ---Caller states really is suffering with right shoulder, when moves arm feels pain into bicep, then back into neck and into shoulder, been going on a few days up to a week, does not hurt unless moves it, also has pain in the back just below rib cage in back and lots of gas also, placed on probioticsDoes the patient have any new or worsening symptoms? ---Yes Will a triage be completed? ---Yes Related visit to physician within the last 2 weeks? ---No Does the PT have any chronic conditions? (i.e. diabetes, asthma, etc.) ---Yes List chronic conditions. ---colitis, copd, diabetes Is this a behavioral health or substance abuse call? ---No Guidelines Guideline Title Affirmed Question Affirmed Notes Back Pain [1] MODERATE back pain (e.g., interferes with normal activities) AND [2] present > 3 days Final Disposition User See PCP When Office is Open (within 3 days) Adella Nissen, RN, Leitha Schuller Comments Pain scale 5 our 1 to 10Caller states back pain since New Years day, continuous. Appointment Made for 7:15 am tom with Alma Friendly, NP Disagree/Comply: Comply Call Id: [phone]

## 2016-05-05 NOTE — Telephone Encounter (Signed)
Pt has appt scheduled with Allie Bossier on 05/06/16 @7 :15.

## 2016-05-06 ENCOUNTER — Ambulatory Visit (INDEPENDENT_AMBULATORY_CARE_PROVIDER_SITE_OTHER): Payer: Medicare Other | Admitting: Primary Care

## 2016-05-06 ENCOUNTER — Encounter: Payer: Self-pay | Admitting: Primary Care

## 2016-05-06 VITALS — BP 134/84 | HR 64 | Temp 98.0°F | Ht 69.5 in | Wt 291.1 lb

## 2016-05-06 DIAGNOSIS — E119 Type 2 diabetes mellitus without complications: Secondary | ICD-10-CM

## 2016-05-06 DIAGNOSIS — E118 Type 2 diabetes mellitus with unspecified complications: Secondary | ICD-10-CM | POA: Diagnosis not present

## 2016-05-06 DIAGNOSIS — E1165 Type 2 diabetes mellitus with hyperglycemia: Secondary | ICD-10-CM

## 2016-05-06 DIAGNOSIS — R140 Abdominal distension (gaseous): Secondary | ICD-10-CM | POA: Diagnosis not present

## 2016-05-06 DIAGNOSIS — R300 Dysuria: Secondary | ICD-10-CM | POA: Diagnosis not present

## 2016-05-06 DIAGNOSIS — IMO0002 Reserved for concepts with insufficient information to code with codable children: Secondary | ICD-10-CM

## 2016-05-06 LAB — POC URINALSYSI DIPSTICK (AUTOMATED)
Bilirubin, UA: NEGATIVE
Ketones, UA: NEGATIVE
LEUKOCYTES UA: NEGATIVE
NITRITE UA: NEGATIVE
Protein, UA: NEGATIVE
RBC UA: NEGATIVE
Spec Grav, UA: 1.025
Urobilinogen, UA: NEGATIVE
pH, UA: 6

## 2016-05-06 LAB — BASIC METABOLIC PANEL WITH GFR
BUN: 8 mg/dL (ref 6–23)
CO2: 34 meq/L — ABNORMAL HIGH (ref 19–32)
Calcium: 9.3 mg/dL (ref 8.4–10.5)
Chloride: 98 meq/L (ref 96–112)
Creatinine, Ser: 0.77 mg/dL (ref 0.40–1.50)
GFR: 109.29 mL/min
Glucose, Bld: 216 mg/dL — ABNORMAL HIGH (ref 70–99)
Potassium: 4.8 meq/L (ref 3.5–5.1)
Sodium: 137 meq/L (ref 135–145)

## 2016-05-06 LAB — HEMOGLOBIN A1C: Hgb A1c MFr Bld: 9 % — ABNORMAL HIGH (ref 4.6–6.5)
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
## 2016-05-06 NOTE — Assessment & Plan Note (Addendum)
Discussed to continue probiotics as he recently restarted yesterday. Should be noticing improvement within the next several days. Abdominal exam overall unremarkable.

## 2016-05-06 NOTE — Patient Instructions (Addendum)
Your back pain is likely a muscle strain/spasm from sleeping in awkward positions.  Try taking Aleve twice daily for shoulder, neck, and lower back pain. Do this for 1-2 weeks.   Application of a heating pad will help to improve your back, shoulder, and neck pain.  Continue your probiotics and take them daily for bloating.  You have sugar in your urine, so stop by the lab so we can check your A1C and kidney function.  Please notify us if no improvement in 2 weeks.  It was a pleasure meeting you!

## 2016-05-06 NOTE — Assessment & Plan Note (Signed)
3+ glucose in urine today. Check A1C as he was above goal in March 2017.

## 2016-05-07 ENCOUNTER — Other Ambulatory Visit: Payer: Self-pay | Admitting: Family Medicine

## 2016-05-07 MED ORDER — SITAGLIPTIN PHOSPHATE 50 MG PO TABS: 50.0000 mg | ORAL_TABLET | Freq: Every day | ORAL | 6 refills | Status: DC

## 2016-06-04 ENCOUNTER — Ambulatory Visit (INDEPENDENT_AMBULATORY_CARE_PROVIDER_SITE_OTHER): Payer: Medicare Other | Admitting: Family Medicine

## 2016-06-04 ENCOUNTER — Encounter: Payer: Self-pay | Admitting: Family Medicine

## 2016-06-04 VITALS — BP 132/84 | HR 80 | Temp 97.5°F | Wt 293.5 lb

## 2016-06-04 DIAGNOSIS — E1165 Type 2 diabetes mellitus with hyperglycemia: Secondary | ICD-10-CM

## 2016-06-04 DIAGNOSIS — IMO0002 Reserved for concepts with insufficient information to code with codable children: Secondary | ICD-10-CM

## 2016-06-04 DIAGNOSIS — J441 Chronic obstructive pulmonary disease with (acute) exacerbation: Secondary | ICD-10-CM

## 2016-06-04 DIAGNOSIS — J984 Other disorders of lung: Secondary | ICD-10-CM | POA: Insufficient documentation

## 2016-06-04 DIAGNOSIS — E118 Type 2 diabetes mellitus with unspecified complications: Secondary | ICD-10-CM | POA: Diagnosis not present

## 2016-06-04 MED ORDER — ALBUTEROL SULFATE HFA 108 (90 BASE) MCG/ACT IN AERS: 2.0000 | INHALATION_SPRAY | Freq: Four times a day (QID) | RESPIRATORY_TRACT | 3 refills | Status: DC | PRN

## 2016-06-04 MED ORDER — HYDROXYZINE HCL 25 MG PO TABS
ORAL_TABLET | ORAL | 1 refills | Status: DC
Start: 2016-06-04 — End: 2017-01-03

## 2016-06-04 MED ORDER — METFORMIN HCL 500 MG PO TABS: 500.0000 mg | ORAL_TABLET | Freq: Two times a day (BID) | ORAL | 3 refills | Status: DC

## 2016-06-04 MED ORDER — DOXYCYCLINE HYCLATE 100 MG PO TABS: 100.0000 mg | ORAL_TABLET | Freq: Two times a day (BID) | ORAL | 0 refills | Status: DC

## 2016-06-04 MED ORDER — SAXAGLIPTIN HCL 2.5 MG PO TABS: 2.5000 mg | ORAL_TABLET | Freq: Every day | ORAL | 6 refills | Status: DC

## 2016-06-04 NOTE — Assessment & Plan Note (Signed)
Mild exacerbation vs bronchitis - treat with doxycycline, albuterol, mucinex. Avoid prednisone in history of DM, minimal wheezing on exam. Seek urgent care if worsening symptoms.

## 2016-06-04 NOTE — Progress Notes (Signed)
Pre visit review using our clinic review tool, if applicable. No additional management support is needed unless otherwise documented below in the visit note. 

## 2016-06-04 NOTE — Assessment & Plan Note (Signed)
Discussed diabetes regimen. Increase metformin to 500mg  BID. Continue glimepiride 1mg  daily.  Price out [place] as [place] not on Electronics engineer.  Foot exam today.

## 2016-06-04 NOTE — Progress Notes (Signed)
BP 132/84   Pulse 80   Temp 97.5 F (36.4 C) (Oral)   Wt 293 lb 8 oz (133.1 kg)   SpO2 95%   BMI 42.72 kg/m    CC: URI Subjective:    Patient ID: [name], male    DOB: 1955-12-31, 61 y.o.   MRN: [id number]  HPI: [name] is a 61 y.o. male presenting on 06/04/2016 for URI (chest congestion x3-4 days; cough; ? PNA per home nurse)   Seen by insurance nurse practitioner yesterday - referred to PCP office for concern over URI x 4 days. Productive cough, head and chest congestion, dyspnea and wheezing. No fever or ST. No known asthma. + COPD. No sick contacts at home. Ex-smoker - but GF smokes. No body aches. Still achey but feeling better today. Treating with OTC remedies with temporary relief.   NP note reviewed - normal PAD screening test.   DM - regularly does check sugars 200s fasting. Compliant with antihyperglycemic regimen which includes: [place] '50mg'$  daily, metformin '500mg'$  daily, amaryl '1mg'$  daily. Denies low sugars or hypoglycemic symptoms. Denies paresthesias. Last diabetic eye exam due.  Pneumovax: 2014.  Prevnar: not due yet. Lab Results  Component Value Date   HGBA1C 9.0 (H) 05/06/2016   Diabetic Foot Exam - Simple   Simple Foot Form Diabetic Foot exam was performed with the following findings:  Yes 06/04/2016 11:33 AM  Visual Inspection See comments:  Yes Sensation Testing Intact to touch and monofilament testing bilaterally:  Yes Pulse Check Posterior Tibialis and Dorsalis pulse intact bilaterally:  Yes Comments Scaling of feet bilaterally 2+ DP bilaterally      Relevant past medical, surgical, family and social history reviewed and updated as indicated. Interim medical history since our last visit reviewed. Allergies and medications reviewed and updated. Current Outpatient Prescriptions on File Prior to Visit  Medication Sig  . ACCU-CHEK FASTCLIX LANCETS MISC Check blood sugar once daily and as instructed. Dx 250.00  . aspirin EC 81 MG tablet  Take 81 mg by mouth daily.  . Blood Glucose Monitoring Suppl (BLOOD GLUCOSE MONITOR SYSTEM) W/DEVICE KIT by Does not apply route. Use to check sugar once daily and as needed Dx: E11.9 **ONE TOUCH VERIO**  . carvedilol (COREG) 6.25 MG tablet TAKE 1 TABLET BY MOUTH 2 TIMES DAILY WITH A MEAL.  . fluticasone (FLONASE) 50 MCG/ACT nasal spray Place 2 sprays into both nostrils daily.  . furosemide (LASIX) 20 MG tablet TAKE 1 TABLET (20 MG TOTAL) BY MOUTH 2 (TWO) TIMES DAILY AS NEEDED.  Marland Kitchen glimepiride (AMARYL) 1 MG tablet Take 1 tablet (1 mg total) by mouth daily with breakfast.  . glucose blood (ONETOUCH VERIO) test strip Use to check sugar once daily and as needed. Dx: E11.9  . Omega-3 Fatty Acids (FISH OIL) 1000 MG CAPS Take 2 capsules (2,000 mg total) by mouth daily.  . potassium chloride (KLOR-CON 10) 10 MEQ tablet TAKE 1 TABLET (10 MEQ TOTAL) BY MOUTH DAILY.  . Probiotic Product (PROBIOTIC DAILY PO) Take by mouth.  . simvastatin (ZOCOR) 20 MG tablet TAKE 1 TABLET (20 MG TOTAL) BY MOUTH EVERY EVENING.   No current facility-administered medications on file prior to visit.     Review of Systems Per HPI unless specifically indicated in ROS section     Objective:    BP 132/84   Pulse 80   Temp 97.5 F (36.4 C) (Oral)   Wt 293 lb 8 oz (133.1 kg)   SpO2 95%  BMI 42.72 kg/m   Wt Readings from Last 3 Encounters:  06/04/16 293 lb 8 oz (133.1 kg)  05/06/16 291 lb 1.9 oz (132.1 kg)  11/06/15 300 lb (136.1 kg)    Physical Exam  Constitutional: He appears well-developed and well-nourished. No distress.  HENT:  Head: Normocephalic and atraumatic.  Right Ear: Hearing, tympanic membrane, external ear and ear canal normal.  Left Ear: Hearing, tympanic membrane, external ear and ear canal normal.  Nose: Nose normal. No mucosal edema or rhinorrhea. Right sinus exhibits no maxillary sinus tenderness and no frontal sinus tenderness. Left sinus exhibits no maxillary sinus tenderness and no frontal  sinus tenderness.  Mouth/Throat: Uvula is midline, oropharynx is clear and moist and mucous membranes are normal. No oropharyngeal exudate, posterior oropharyngeal edema, posterior oropharyngeal erythema or tonsillar abscesses.  Eyes: Conjunctivae and EOM are normal. Pupils are equal, round, and reactive to light. No scleral icterus.  Neck: Normal range of motion. Neck supple.  Cardiovascular: Normal rate, regular rhythm, normal heart sounds and intact distal pulses.   No murmur heard. Pulmonary/Chest: Effort normal. No respiratory distress. He has decreased breath sounds (coarse throughout). He has wheezes (faint exp). He has no rhonchi. He has no rales.  Musculoskeletal: He exhibits no edema.  See HPI for foot exam if done  Lymphadenopathy:    He has no cervical adenopathy.  Skin: Skin is warm and dry. No rash noted.  Psychiatric: He has a normal mood and affect.  Nursing note and vitals reviewed.  Results for orders placed or performed in visit on 05/06/16  Hemoglobin A1c  Result Value Ref Range   Hgb A1c MFr Bld 9.0 (H) 4.6 - 6.5 %  Basic metabolic panel  Result Value Ref Range   Sodium 137 135 - 145 mEq/L   Potassium 4.8 3.5 - 5.1 mEq/L   Chloride 98 96 - 112 mEq/L   CO2 34 (H) 19 - 32 mEq/L   Glucose, Bld 216 (H) 70 - 99 mg/dL   BUN 8 6 - 23 mg/dL   Creatinine, Ser 0.77 0.40 - 1.50 mg/dL   Calcium 9.3 8.4 - 10.5 mg/dL   GFR 109.29 >60.00 mL/min  POCT Urinalysis Dipstick (Automated)  Result Value Ref Range   Color, UA yellow    Clarity, UA clear    Glucose, UA 3+    Bilirubin, UA negative    Ketones, UA negative    Spec Grav, UA 1.025    Blood, UA negative    pH, UA 6.0    Protein, UA negative    Urobilinogen, UA negative    Nitrite, UA negative    Leukocytes, UA Negative Negative      Assessment & Plan:   Problem List Items Addressed This Visit    COPD with acute exacerbation ([place]) - Primary    Mild exacerbation vs bronchitis - treat with doxycycline,  albuterol, mucinex. Avoid prednisone in history of DM, minimal wheezing on exam. Seek urgent care if worsening symptoms.       Relevant Medications   albuterol (PROVENTIL HFA;VENTOLIN HFA) 108 (90 Base) MCG/ACT inhaler   Diabetes mellitus type 2 with complications, uncontrolled ([place])    Discussed diabetes regimen. Increase metformin to '500mg'$  BID. Continue glimepiride '1mg'$  daily.  Price out [place] as [place] not on Electronics engineer.  Foot exam today.      Relevant Medications   saxagliptin HCl (ONGLYZA) 2.5 MG TABS tablet   metFORMIN (GLUCOPHAGE) 500 MG tablet       Follow up  plan: No Follow-up on file.  [name], MD

## 2016-06-04 NOTE — Patient Instructions (Addendum)
Continue glimepiride. Increase metformin to 500mg  twice daily with meals. Price out [place] in place of [place].  I've refilled hydroxyzine. Price shop on [email or web address] Schedule eye exam as you're due.  I think you have COPD flare. Treat with doxycycline, albuterol inhaler. May continue plain mucinex with plenty of water to help mobilize mucous. Return if not improving with treatment.

## 2016-06-22 ENCOUNTER — Ambulatory Visit (INDEPENDENT_AMBULATORY_CARE_PROVIDER_SITE_OTHER)
Admission: RE | Admit: 2016-06-22 | Discharge: 2016-06-22 | Disposition: A | Discharge status: Home / Self Care | Payer: Medicare Other | Source: Ambulatory Visit | Attending: Family Medicine | Admitting: Family Medicine

## 2016-06-22 ENCOUNTER — Ambulatory Visit (INDEPENDENT_AMBULATORY_CARE_PROVIDER_SITE_OTHER): Payer: Medicare Other | Admitting: Family Medicine

## 2016-06-22 ENCOUNTER — Encounter: Payer: Self-pay | Admitting: Family Medicine

## 2016-06-22 VITALS — BP 136/84 | HR 76 | Temp 98.4°F | Wt 283.2 lb

## 2016-06-22 DIAGNOSIS — R5381 Other malaise: Secondary | ICD-10-CM | POA: Diagnosis not present

## 2016-06-22 DIAGNOSIS — J441 Chronic obstructive pulmonary disease with (acute) exacerbation: Secondary | ICD-10-CM

## 2016-06-22 DIAGNOSIS — I5022 Chronic systolic (congestive) heart failure: Secondary | ICD-10-CM

## 2016-06-22 DIAGNOSIS — R05 Cough: Secondary | ICD-10-CM | POA: Diagnosis not present

## 2016-06-22 DIAGNOSIS — E118 Type 2 diabetes mellitus with unspecified complications: Secondary | ICD-10-CM | POA: Diagnosis not present

## 2016-06-22 DIAGNOSIS — K429 Umbilical hernia without obstruction or gangrene: Secondary | ICD-10-CM | POA: Diagnosis not present

## 2016-06-22 DIAGNOSIS — F410 Panic disorder [episodic paroxysmal anxiety] without agoraphobia: Secondary | ICD-10-CM | POA: Diagnosis not present

## 2016-06-22 DIAGNOSIS — E1165 Type 2 diabetes mellitus with hyperglycemia: Secondary | ICD-10-CM | POA: Diagnosis not present

## 2016-06-22 DIAGNOSIS — Z6841 Body Mass Index (BMI) 40.0 and over, adult: Secondary | ICD-10-CM | POA: Diagnosis not present

## 2016-06-22 DIAGNOSIS — R5383 Other fatigue: Secondary | ICD-10-CM

## 2016-06-22 DIAGNOSIS — IMO0002 Reserved for concepts with insufficient information to code with codable children: Secondary | ICD-10-CM

## 2016-06-22 LAB — COMPREHENSIVE METABOLIC PANEL
ALT: 25 U/L (ref 0–53)
AST: 31 U/L (ref 0–37)
Albumin: 3.9 g/dL (ref 3.5–5.2)
Alkaline Phosphatase: 72 U/L (ref 39–117)
BILIRUBIN TOTAL: 0.5 mg/dL (ref 0.2–1.2)
BUN: 6 mg/dL (ref 6–23)
CALCIUM: 9.4 mg/dL (ref 8.4–10.5)
CHLORIDE: 97 meq/L (ref 96–112)
CO2: 34 meq/L — AB (ref 19–32)
Creatinine, Ser: 0.66 mg/dL (ref 0.40–1.50)
GFR: 130.52 mL/min (ref >60.00)
GLUCOSE: 210 mg/dL — AB (ref 70–99)
Potassium: 4.5 mEq/L (ref 3.5–5.1)
Sodium: 137 mEq/L (ref 135–145)
Total Protein: 6.9 g/dL (ref 6.0–8.3)

## 2016-06-22 LAB — SEDIMENTATION RATE: SED RATE: 53 mm/h — AB (ref 0–20)

## 2016-06-22 LAB — CBC WITH DIFFERENTIAL/PLATELET
BASOS PCT: 0.4 % (ref 0.0–3.0)
Basophils Absolute: 0 10*3/uL (ref 0.0–0.1)
Eosinophils Absolute: 0.2 10*3/uL (ref 0.0–0.7)
Eosinophils Relative: 1.4 % (ref 0.0–5.0)
HEMATOCRIT: 46 % (ref 39.0–52.0)
Hemoglobin: 15.7 g/dL (ref 13.0–17.0)
LYMPHS ABS: 2.4 10*3/uL (ref 0.7–4.0)
LYMPHS PCT: 19.8 % (ref 12.0–46.0)
MCHC: 34.3 g/dL (ref 30.0–36.0)
MCV: 89 fl (ref 78.0–100.0)
MONOS PCT: 9.8 % (ref 3.0–12.0)
Monocytes Absolute: 1.2 10*3/uL — ABNORMAL HIGH (ref 0.1–1.0)
NEUTROS ABS: 8.2 10*3/uL — AB (ref 1.4–7.7)
NEUTROS PCT: 68.6 % (ref 43.0–77.0)
PLATELETS: 296 10*3/uL (ref 150.0–400.0)
RBC: 5.16 Mil/uL (ref 4.22–5.81)
RDW: 12.9 % (ref 11.5–15.5)
WBC: 12 10*3/uL — ABNORMAL HIGH (ref 4.0–10.5)

## 2016-06-22 LAB — BRAIN NATRIURETIC PEPTIDE: Pro B Natriuretic peptide (BNP): 32 pg/mL (ref 0.0–100.0)

## 2016-06-22 LAB — POC URINALSYSI DIPSTICK (AUTOMATED)
Blood, UA: NEGATIVE
Ketones, UA: NEGATIVE
Leukocytes, UA: NEGATIVE
Nitrite, UA: NEGATIVE
Protein, UA: NEGATIVE
Spec Grav, UA: >=1.03
Urobilinogen, UA: 1
pH, UA: 6

## 2016-06-22 LAB — POC INFLUENZA A&B (BINAX/QUICKVUE)
INFLUENZA A, POC: NEGATIVE
Influenza B, POC: NEGATIVE

## 2016-06-22 LAB — TSH: TSH: 2.37 u[IU]/mL (ref 0.35–4.50)

## 2016-06-22 MED ORDER — GUAIFENESIN-CODEINE 100-10 MG/5ML PO SYRP: 5.0000 mL | ORAL_SOLUTION | Freq: Two times a day (BID) | ORAL | 0 refills | Status: DC | PRN

## 2016-06-22 MED ORDER — AZITHROMYCIN 250 MG PO TABS: ORAL_TABLET | ORAL | 0 refills | Status: DC

## 2016-06-22 MED ORDER — SITAGLIPTIN PHOSPHATE 50 MG PO TABS: 50.0000 mg | ORAL_TABLET | Freq: Every day | ORAL | 6 refills | Status: DC

## 2016-06-22 NOTE — Assessment & Plan Note (Signed)
Deteriorated recently - discussed restarting daily controller medication like SSRI - pt will consider.  Continue hydroxyzine PRN - refilled today .

## 2016-06-22 NOTE — Assessment & Plan Note (Signed)
10 lb weight loss noted.  

## 2016-06-22 NOTE — Assessment & Plan Note (Addendum)
Marked malaise over last 3 wks with diaphoresis in office. Check labs today, urinalysis (with glucosuria), CXR (nonacute), EKG (nonacute). Flu swab negative. ?anxiety related. See above.

## 2016-06-22 NOTE — Assessment & Plan Note (Signed)
Lungs clear, no significant pedal edema. Check BNP today.

## 2016-06-22 NOTE — Progress Notes (Addendum)
BP 136/84   Pulse 76   Temp 98.4 F (36.9 C) (Oral)   Wt 283 lb 4 oz (128.5 kg)   SpO2 94%   BMI 41.23 kg/m    CC: f/u visit Subjective:    Patient ID: [name], male    DOB: 08-Nov-1955, 61 y.o.   MRN: [id number]  HPI: [name] is a 61 y.o. male presenting on 06/22/2016 for Follow-up (never got better after abx)   See prior note for details. Seen here 06/04/2016 with COPD exacerbation with bronchitis, treated with doxycycline, albuterol, mucinex. We held off on prednisone due to poorly controlled diabetes.   Doxycycline helped congestion some but persistent malaise, feverish, diaphoretic, productive cough, chest tingling and tickle leading to cough. Persistent dyspnea and wheezing. Last night he developed sore throat. No appetite. Persistent chest congestion. Staying dizzy. Cough has improved some. General malaise, has lost 10 lbs in the last 3 weeks.   No documented fevers. No chest pain/tightness. No palpitations, LOC. No leg swelling.  No sick contacts at home. No abd pain, nausea/vomiting, diarrhea. Endorses recurrent panic attacks.   Lab Results  Component Value Date   HGBA1C 9.0 (H) 05/06/2016  sugars improving - down to 160s fasting from 200s.   He stopped [place] 1 wk ago because he ran out.   Relevant past medical, surgical, family and social history reviewed and updated as indicated. Interim medical history since our last visit reviewed. Allergies and medications reviewed and updated. Outpatient Medications Prior to Visit  Medication Sig Dispense Refill  . ACCU-CHEK FASTCLIX LANCETS MISC Check blood sugar once daily and as instructed. Dx 250.00 100 each 3  . albuterol (PROVENTIL HFA;VENTOLIN HFA) 108 (90 Base) MCG/ACT inhaler Inhale 2 puffs into the lungs every 6 (six) hours as needed for wheezing or shortness of breath. 1 Inhaler 3  . aspirin EC 81 MG tablet Take 81 mg by mouth daily.    . Blood Glucose Monitoring Suppl (BLOOD GLUCOSE MONITOR SYSTEM)  W/DEVICE KIT by Does not apply route. 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 carvedilol (COREG) 6.25 MG tablet TAKE 1 TABLET BY MOUTH 2 TIMES DAILY WITH A MEAL. 180 tablet 0  . fluticasone (FLONASE) 50 MCG/ACT nasal spray Place 2 sprays into both nostrils daily. 16 g 3  . furosemide (LASIX) 20 MG tablet TAKE 1 TABLET (20 MG TOTAL) BY MOUTH 2 (TWO) TIMES DAILY AS NEEDED. 90 tablet 3  . glimepiride (AMARYL) 1 MG tablet Take 1 tablet (1 mg total) by mouth daily with breakfast. 90 tablet 3  . glucose blood (ONETOUCH VERIO) test strip Use to check sugar once daily and as needed. Dx: E11.9 100 each 3  . hydrOXYzine (ATARAX/VISTARIL) 25 MG tablet TAKE 1 TABLET (25 MG TOTAL) BY MOUTH 2 (TWO) TIMES DAILY AS NEEDED FOR ANXIETY. 60 tablet 1  . metFORMIN (GLUCOPHAGE) 500 MG tablet Take 1 tablet (500 mg total) by mouth 2 (two) times daily with a meal. 180 tablet 3  . Omega-3 Fatty Acids (FISH OIL) 1000 MG CAPS Take 2 capsules (2,000 mg total) by mouth daily.  0  . potassium chloride (KLOR-CON 10) 10 MEQ tablet TAKE 1 TABLET (10 MEQ TOTAL) BY MOUTH DAILY. 90 tablet 0  . Probiotic Product (PROBIOTIC DAILY PO) Take by mouth.    . simvastatin (ZOCOR) 20 MG tablet TAKE 1 TABLET (20 MG TOTAL) BY MOUTH EVERY EVENING. 90 tablet 0  . saxagliptin  HCl (ONGLYZA) 2.5 MG TABS tablet Take 1 tablet (2.5 mg total) by mouth daily. (Patient not taking: Reported on 06/22/2016) 30 tablet 6  . doxycycline (VIBRA-TABS) 100 MG tablet Take 1 tablet (100 mg total) by mouth 2 (two) times daily. 20 tablet 0   No facility-administered medications prior to visit.      Per HPI unless specifically indicated in ROS section below Review of Systems     Objective:    BP 136/84   Pulse 76   Temp 98.4 F (36.9 C) (Oral)   Wt 283 lb 4 oz (128.5 kg)   SpO2 94%   BMI 41.23 kg/m   Wt Readings from Last 3 Encounters:  06/22/16 283 lb 4 oz (128.5 kg)  06/04/16 293 lb 8 oz (133.1 kg)  05/06/16 291 lb  1.9 oz (132.1 kg)    Physical Exam  Constitutional: He appears well-developed and well-nourished. No distress.  HENT:  Head: Normocephalic and atraumatic.  Right Ear: Hearing, tympanic membrane, external ear and ear canal normal.  Left Ear: Hearing, tympanic membrane, external ear and ear canal normal.  Nose: No mucosal edema or rhinorrhea. Right sinus exhibits maxillary sinus tenderness. Right sinus exhibits no frontal sinus tenderness. Left sinus exhibits maxillary sinus tenderness. Left sinus exhibits no frontal sinus tenderness.  Mouth/Throat: Uvula is midline and mucous membranes are normal. Posterior oropharyngeal erythema present. No oropharyngeal exudate, posterior oropharyngeal edema or tonsillar abscesses.  PNDrainage present  Eyes: Conjunctivae and EOM are normal. Pupils are equal, round, and reactive to light. No scleral icterus.  Neck: Normal range of motion. Neck supple.  Cardiovascular: Normal rate, regular rhythm, normal heart sounds and intact distal pulses.   No murmur heard. Pulmonary/Chest: Effort normal and breath sounds normal. No respiratory distress. He has no wheezes. He has no rales.  Cough present  Abdominal: Soft. Bowel sounds are normal. He exhibits no distension and no mass. There is no tenderness. There is no rebound and no guarding. A hernia (large umbilical reducible, nontender) is present.  Musculoskeletal: He exhibits no edema.  Lymphadenopathy:    He has no cervical adenopathy.  Skin: Skin is warm. No rash noted. He is diaphoretic.  Nursing note and vitals reviewed.  Results for orders placed or performed in visit on 06/22/16  Sedimentation rate  Result Value Ref Range   Sed Rate 53 (H) 0 - 20 mm/hr  Comprehensive metabolic panel  Result Value Ref Range   Sodium 137 135 - 145 mEq/L   Potassium 4.5 3.5 - 5.1 mEq/L   Chloride 97 96 - 112 mEq/L   CO2 34 (H) 19 - 32 mEq/L   Glucose, Bld 210 (H) 70 - 99 mg/dL   BUN 6 6 - 23 mg/dL   Creatinine, Ser  0.66 0.40 - 1.50 mg/dL   Total Bilirubin 0.5 0.2 - 1.2 mg/dL   Alkaline Phosphatase 72 39 - 117 U/L   AST 31 0 - 37 U/L   ALT 25 0 - 53 U/L   Total Protein 6.9 6.0 - 8.3 g/dL   Albumin 3.9 3.5 - 5.2 g/dL   Calcium 9.4 8.4 - 10.5 mg/dL   GFR 130.52 >60.00 mL/min  CBC with Differential/Platelet  Result Value Ref Range   WBC 12.0 (H) 4.0 - 10.5 K/uL   RBC 5.16 4.22 - 5.81 Mil/uL   Hemoglobin 15.7 13.0 - 17.0 g/dL   HCT 46.0 39.0 - 52.0 %   MCV 89.0 78.0 - 100.0 fl   MCHC 34.3 30.0 - 36.0  g/dL   RDW 12.9 11.5 - 15.5 %   Platelets 296.0 150.0 - 400.0 K/uL   Neutrophils Relative % 68.6 43.0 - 77.0 %   Lymphocytes Relative 19.8 12.0 - 46.0 %   Monocytes Relative 9.8 3.0 - 12.0 %   Eosinophils Relative 1.4 0.0 - 5.0 %   Basophils Relative 0.4 0.0 - 3.0 %   Neutro Abs 8.2 (H) 1.4 - 7.7 K/uL   Lymphs Abs 2.4 0.7 - 4.0 K/uL   Monocytes Absolute 1.2 (H) 0.1 - 1.0 K/uL   Eosinophils Absolute 0.2 0.0 - 0.7 K/uL   Basophils Absolute 0.0 0.0 - 0.1 K/uL  TSH  Result Value Ref Range   TSH 2.37 0.35 - 4.50 uIU/mL  Brain natriuretic peptide  Result Value Ref Range   Pro B Natriuretic peptide (BNP) 32.0 0.0 - 100.0 pg/mL  POC Influenza A&B (Binax test)  Result Value Ref Range   Influenza A, POC Negative Negative   Influenza B, POC Negative Negative  POCT Urinalysis Dipstick (Automated)  Result Value Ref Range   Color, UA Amber    Clarity, UA Clear    Glucose, UA 3+    Bilirubin, UA 1+    Ketones, UA Negative    Spec Grav, UA >=1.030    Blood, UA Negative    pH, UA 6.0    Protein, UA Negative    Urobilinogen, UA 1.0    Nitrite, UA Negative    Leukocytes, UA Negative Negative   EKG - NSR rage 75, normal axis, no acute ST/T changes, rsR V1, good R wave progression, PACs, borderline PR prolongation     Assessment & Plan:   Problem List Items Addressed This Visit    BMI 40.0-44.9, adult (HCC)    10 lb weight loss noted       Relevant Medications   sitaGLIPtin (JANUVIA) 50 MG  tablet   Chronic systolic CHF (congestive heart failure) (HCC)    Lungs clear, no significant pedal edema. Check BNP today.       COPD with acute exacerbation (HCC)    S/p doxycycline course, with improvement, however, persistent malaise and chest > head congestion. No significant wheezing, will avoid prednisone  No signs of pneumonia on exam or by CXR.  Will add cheratussin cough syrup Will provide with WASP for azithromycin antibiotic to cover possible residual sinusitis vs bronchitis, discussed indications of when to fill.  Pt agrees with plan.       Relevant Medications   guaiFENesin-codeine (CHERATUSSIN AC) 100-10 MG/5ML syrup   azithromycin (ZITHROMAX) 250 MG tablet   Other Relevant Orders   DG Chest 2 View (Completed)   Diabetes mellitus type 2 with complications, uncontrolled (HCC)    Chronic, uncontrolled.       Relevant Medications   sitaGLIPtin (JANUVIA) 50 MG tablet   Malaise and fatigue - Primary    Marked malaise over last 3 wks with diaphoresis in office. Check labs today, urinalysis (with glucosuria), CXR (nonacute), EKG (nonacute). Flu swab negative. ?anxiety related. See above.       Relevant Orders   Sedimentation rate (Completed)   Comprehensive metabolic panel (Completed)   CBC with Differential/Platelet (Completed)   TSH (Completed)   DG Chest 2 View (Completed)   POC Influenza A&B (Binax test) (Completed)   EKG 12-Lead (Completed)   POCT Urinalysis Dipstick (Automated) (Completed)   Brain natriuretic peptide (Completed)   Panic attacks    Deteriorated recently - discussed restarting daily controller medication like SSRI -  pt will consider.  Continue hydroxyzine PRN - refilled today .       Umbilical hernia    Large. Not acute issue. Previously referred to surgery for this.           Follow up plan: Return in about 2 months (around 08/20/2016), or if symptoms worsen or fail to improve, for follow up visit.  [name], MD

## 2016-06-22 NOTE — Assessment & Plan Note (Signed)
Large. Not acute issue. Previously referred to surgery for this.

## 2016-06-22 NOTE — Progress Notes (Signed)
Pre visit review using our clinic review tool, if applicable. No additional management support is needed unless otherwise documented below in the visit note. 

## 2016-06-22 NOTE — Assessment & Plan Note (Signed)
Chronic, uncontrolled.

## 2016-06-22 NOTE — Assessment & Plan Note (Addendum)
S/p doxycycline course, with improvement, however, persistent malaise and chest > head congestion. No significant wheezing, will avoid prednisone  No signs of pneumonia on exam or by CXR.  Will add cheratussin cough syrup Will provide with WASP for azithromycin antibiotic to cover possible residual sinusitis vs bronchitis, discussed indications of when to fill.  Pt agrees with plan.

## 2016-06-22 NOTE — Patient Instructions (Addendum)
I'm sorry you're not feeling well Labs today - we will call you with results. Urinalysis today - sugar in urine Chest xray today. EKG ok today  Flu swab today was negative.  If worsening panic attacks, let me know for daily anxiety medication.  We will let you know if we need to continue [place].  Return in 2 months for diabetes follow up visit.

## 2016-07-02 ENCOUNTER — Other Ambulatory Visit: Payer: Self-pay | Admitting: Family Medicine

## 2016-07-09 ENCOUNTER — Telehealth: Payer: Self-pay | Admitting: Family Medicine

## 2016-07-09 NOTE — Telephone Encounter (Signed)
Pt declined to schedule AWV at this time

## 2016-07-26 ENCOUNTER — Ambulatory Visit (INDEPENDENT_AMBULATORY_CARE_PROVIDER_SITE_OTHER): Payer: Medicare Other | Admitting: Family Medicine

## 2016-07-26 ENCOUNTER — Encounter: Payer: Self-pay | Admitting: Family Medicine

## 2016-07-26 VITALS — BP 118/76 | HR 78 | Temp 98.5°F | Wt 296.8 lb

## 2016-07-26 DIAGNOSIS — M25511 Pain in right shoulder: Secondary | ICD-10-CM | POA: Diagnosis not present

## 2016-07-26 DIAGNOSIS — R109 Unspecified abdominal pain: Secondary | ICD-10-CM | POA: Diagnosis not present

## 2016-07-26 DIAGNOSIS — R10A1 Flank pain, right side: Secondary | ICD-10-CM | POA: Insufficient documentation

## 2016-07-26 DIAGNOSIS — M349 Systemic sclerosis, unspecified: Secondary | ICD-10-CM | POA: Insufficient documentation

## 2016-07-26 LAB — POC URINALSYSI DIPSTICK (AUTOMATED)
Ketones, UA: 5
Leukocytes, UA: NEGATIVE
Nitrite, UA: NEGATIVE
Protein, UA: 30
RBC UA: NEGATIVE
SPEC GRAV UA: 1.025 (ref 1.030–1.035)
Urobilinogen, UA: 1 (ref ?–2.0)
pH, UA: 6.5 (ref 5.0–8.0)

## 2016-07-26 MED ORDER — METHOCARBAMOL 500 MG PO TABS: 500.0000 mg | ORAL_TABLET | Freq: Three times a day (TID) | ORAL | 1 refills | Status: DC | PRN

## 2016-07-26 NOTE — Patient Instructions (Addendum)
Urinalysis today. We will also order kidney ultrasound.  I think you have rotator cuff irritation.  Use heating pad to neck and shoulders.  Work on posture.  Use muscle relaxant as your lumbar and cervical muscles are also very tight and stiff with spasm.

## 2016-07-26 NOTE — Assessment & Plan Note (Addendum)
Anticipate 2 separate issues - R flank, R upper buttock pain ?lumbar contribution.  Anticipate component of lumbar paraspinous mm tightness/spasm - treat with heating pad and robaxin muscle relaxant.  Ongoing for several months - will check UA and renal panel to further eval R kidney.  No known h/o kidney stones.

## 2016-07-26 NOTE — Assessment & Plan Note (Addendum)
Anticipate 2 separate issues - discussed with patient - anticipate RTC irritation. Discussed heating pad, robaxin muscle relaxant. Update if not improving with treatment.

## 2016-07-26 NOTE — Progress Notes (Signed)
Pre visit review using our clinic review tool, if applicable. No additional management support is needed unless otherwise documented below in the visit note. 

## 2016-07-26 NOTE — Progress Notes (Signed)
BP 118/76 (BP Location: Left Arm, Patient Position: Sitting, Cuff Size: Large)   Pulse 78   Temp 98.5 F (36.9 C) (Oral)   Wt 296 lb 12 oz (134.6 kg)   SpO2 93%   BMI 43.19 kg/m    CC: LBP Subjective:    Patient ID: [name], male    DOB: 05/09/1955, 61 y.o.   MRN: [id number]  HPI: [name] is a 61 y.o. male presenting on 07/26/2016 for Back Pain   Ongoing right lower back pain that starts at flank just above buttock with radiation to upper back and R shoulder/arm. Trouble sleeping 2/2 back pain when laying on right side, no pain when laying on left side.   No fevers/chills, dysuria, hematuria, urgency, bowel changes, nausea. Denies inciting trauma/injury. He may have injured lowre back 06/2016 when preparing step child's wedding - lifting heavy boxes.   Hasn't taken anything other than aleve for this - not really helping.   Relevant past medical, surgical, family and social history reviewed and updated as indicated. Interim medical history since our last visit reviewed. Allergies and medications reviewed and updated. Outpatient Medications Prior to Visit  Medication Sig Dispense Refill  . ACCU-CHEK FASTCLIX LANCETS MISC Check blood sugar once daily and as instructed. Dx 250.00 100 each 3  . aspirin EC 81 MG tablet Take 81 mg by mouth daily.    . Blood Glucose Monitoring Suppl (BLOOD GLUCOSE MONITOR SYSTEM) W/DEVICE KIT by Does not apply route. 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 carvedilol (COREG) 6.25 MG tablet TAKE 1 TABLET BY MOUTH 2 TIMES DAILY WITH A MEAL. 180 tablet 1  . fluticasone (FLONASE) 50 MCG/ACT nasal spray Place 2 sprays into both nostrils daily. 16 g 3  . furosemide (LASIX) 20 MG tablet TAKE 1 TABLET (20 MG TOTAL) BY MOUTH 2 (TWO) TIMES DAILY AS NEEDED. 90 tablet 3  . glimepiride (AMARYL) 1 MG tablet Take 1 tablet (1 mg total) by mouth daily with breakfast. 90 tablet 3  . glucose blood (ONETOUCH VERIO)  test strip Use to check sugar once daily and as needed. Dx: E11.9 100 each 3  . hydrOXYzine (ATARAX/VISTARIL) 25 MG tablet TAKE 1 TABLET (25 MG TOTAL) BY MOUTH 2 (TWO) TIMES DAILY AS NEEDED FOR ANXIETY. 60 tablet 1  . metFORMIN (GLUCOPHAGE) 500 MG tablet Take 1 tablet (500 mg total) by mouth 2 (two) times daily with a meal. 180 tablet 3  . Omega-3 Fatty Acids (FISH OIL) 1000 MG CAPS Take 2 capsules (2,000 mg total) by mouth daily.  0  . Probiotic Product (PROBIOTIC DAILY PO) Take by mouth.    . simvastatin (ZOCOR) 20 MG tablet TAKE 1 TABLET (20 MG TOTAL) BY MOUTH EVERY EVENING. 90 tablet 0  . potassium chloride (KLOR-CON 10) 10 MEQ tablet TAKE 1 TABLET (10 MEQ TOTAL) BY MOUTH DAILY. (Patient not taking: Reported on 07/26/2016) 90 tablet 0  . albuterol (PROVENTIL HFA;VENTOLIN HFA) 108 (90 Base) MCG/ACT inhaler Inhale 2 puffs into the lungs every 6 (six) hours as needed for wheezing or shortness of breath. 1 Inhaler 3  . azithromycin (ZITHROMAX) 250 MG tablet Take two tablets on day one followed by one tablet on days 2-5 6 each 0  . guaiFENesin-codeine (CHERATUSSIN AC) 100-10 MG/5ML syrup Take 5 mLs by mouth 2 (two) times daily as needed for cough (sedation precautions). 140 mL 0  . saxagliptin HCl (ONGLYZA) 2.5 MG TABS  tablet Take 1 tablet (2.5 mg total) by mouth daily. (Patient not taking: Reported on 06/22/2016) 30 tablet 6  . sitaGLIPtin (JANUVIA) 50 MG tablet Take 1 tablet (50 mg total) by mouth daily. 30 tablet 6   No facility-administered medications prior to visit.      Per HPI unless specifically indicated in ROS section below Review of Systems     Objective:    BP 118/76 (BP Location: Left Arm, Patient Position: Sitting, Cuff Size: Large)   Pulse 78   Temp 98.5 F (36.9 C) (Oral)   Wt 296 lb 12 oz (134.6 kg)   SpO2 93%   BMI 43.19 kg/m   Wt Readings from Last 3 Encounters:  07/26/16 296 lb 12 oz (134.6 kg)  06/22/16 283 lb 4 oz (128.5 kg)  06/04/16 293 lb 8 oz (133.1 kg)      Physical Exam  Constitutional: He appears well-developed and well-nourished. No distress.  HENT:  Mouth/Throat: Oropharynx is clear and moist. No oropharyngeal exudate.  Cardiovascular: Normal rate, regular rhythm, normal heart sounds and intact distal pulses.   No murmur heard. Pulmonary/Chest: Effort normal and breath sounds normal. No respiratory distress. He has no wheezes. He has no rales.  Abdominal: Soft. Normal appearance. There is no tenderness. There is CVA tenderness (mild R).  Musculoskeletal: He exhibits no edema.  Tender to palpation at R [facility] joint and R anterior shoulder FROM forward and lateral flexion bilateral shoulders No significant pain midline cervical, thoracic, lumbar spine Tender/tightness at R upper lumbar paraspinous mm Neg SLR bilaterally. No significant pain with int/ext rotation at hip. No pain at SIJ, GTB or sciatic notch bilaterally.  Poor posture with significant kyphosis of neck present.  Skin: Skin is warm and dry. No rash noted.  Woody skin thickening of posterior neck and shoulders  Psychiatric: He has a normal mood and affect.  Nursing note and vitals reviewed.   Lab Results  Component Value Date   HGBA1C 9.0 (H) 05/06/2016       Assessment & Plan:   Problem List Items Addressed This Visit    Right flank pain - Primary    Anticipate 2 separate issues - R flank, R upper buttock pain ?lumbar contribution.  Anticipate component of lumbar paraspinous mm tightness/spasm - treat with heating pad and robaxin muscle relaxant.  Ongoing for several months - will check UA and renal panel to further eval R kidney.  No known h/o kidney stones.       Relevant Orders   US Renal   POCT Urinalysis Dipstick (Automated) (Completed)   Right shoulder pain    Anticipate 2 separate issues - discussed with patient - anticipate RTC irritation. Discussed heating pad, robaxin muscle relaxant. Update if not improving with treatment.       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)    ?of this in diabetic with significant woody edema of posterior neck and shoulders Discussed with patient.           Follow up plan: Return if symptoms worsen or fail to improve.  [name], MD

## 2016-07-26 NOTE — Assessment & Plan Note (Signed)
?  of this in diabetic with significant woody edema of posterior neck and shoulders Discussed with patient.

## 2016-07-29 ENCOUNTER — Ambulatory Visit
Admission: RE | Admit: 2016-07-29 | Discharge: 2016-07-29 | Disposition: A | Discharge status: Home / Self Care | Payer: Medicare Other | Source: Ambulatory Visit | Attending: Family Medicine | Admitting: Family Medicine

## 2016-07-29 DIAGNOSIS — R109 Unspecified abdominal pain: Secondary | ICD-10-CM | POA: Diagnosis not present

## 2016-08-01 ENCOUNTER — Other Ambulatory Visit: Payer: Self-pay | Admitting: Family Medicine

## 2016-08-01 ENCOUNTER — Encounter: Payer: Self-pay | Admitting: Family Medicine

## 2016-08-01 DIAGNOSIS — K760 Fatty (change of) liver, not elsewhere classified: Secondary | ICD-10-CM | POA: Insufficient documentation

## 2016-08-01 MED ORDER — GLIMEPIRIDE 2 MG PO TABS: 2.0000 mg | ORAL_TABLET | Freq: Every day | ORAL | 0 refills | Status: DC

## 2016-08-02 ENCOUNTER — Telehealth: Payer: Self-pay | Admitting: Family Medicine

## 2016-08-02 NOTE — Telephone Encounter (Signed)
Spoke with patient.

## 2016-08-02 NOTE — Telephone Encounter (Signed)
Pt called about his ultrasound results. Please call [phone] Thanks

## 2016-08-06 ENCOUNTER — Encounter: Payer: Self-pay | Admitting: Family Medicine

## 2016-08-06 ENCOUNTER — Ambulatory Visit (INDEPENDENT_AMBULATORY_CARE_PROVIDER_SITE_OTHER): Payer: Medicare Other | Admitting: Family Medicine

## 2016-08-06 VITALS — BP 136/84 | HR 78 | Temp 98.1°F | Wt 300.0 lb

## 2016-08-06 DIAGNOSIS — E118 Type 2 diabetes mellitus with unspecified complications: Secondary | ICD-10-CM | POA: Diagnosis not present

## 2016-08-06 DIAGNOSIS — K760 Fatty (change of) liver, not elsewhere classified: Secondary | ICD-10-CM | POA: Diagnosis not present

## 2016-08-06 DIAGNOSIS — D1779 Benign lipomatous neoplasm of other sites: Secondary | ICD-10-CM | POA: Diagnosis not present

## 2016-08-06 DIAGNOSIS — Z6841 Body Mass Index (BMI) 40.0 and over, adult: Secondary | ICD-10-CM | POA: Diagnosis not present

## 2016-08-06 DIAGNOSIS — R109 Unspecified abdominal pain: Secondary | ICD-10-CM

## 2016-08-06 DIAGNOSIS — K429 Umbilical hernia without obstruction or gangrene: Secondary | ICD-10-CM

## 2016-08-06 DIAGNOSIS — I5022 Chronic systolic (congestive) heart failure: Secondary | ICD-10-CM | POA: Diagnosis not present

## 2016-08-06 DIAGNOSIS — I428 Other cardiomyopathies: Secondary | ICD-10-CM | POA: Diagnosis not present

## 2016-08-06 DIAGNOSIS — E1165 Type 2 diabetes mellitus with hyperglycemia: Secondary | ICD-10-CM | POA: Diagnosis not present

## 2016-08-06 DIAGNOSIS — IMO0002 Reserved for concepts with insufficient information to code with codable children: Secondary | ICD-10-CM

## 2016-08-06 LAB — COMPREHENSIVE METABOLIC PANEL
ALBUMIN: 3.9 g/dL (ref 3.5–5.2)
ALK PHOS: 78 U/L (ref 39–117)
ALT: 38 U/L (ref 0–53)
AST: 48 U/L — ABNORMAL HIGH (ref 0–37)
BUN: 8 mg/dL (ref 6–23)
CALCIUM: 9.2 mg/dL (ref 8.4–10.5)
CHLORIDE: 98 meq/L (ref 96–112)
CO2: 36 mEq/L — ABNORMAL HIGH (ref 19–32)
Creatinine, Ser: 0.69 mg/dL (ref 0.40–1.50)
GFR: 123.94 mL/min (ref >60.00)
Glucose, Bld: 279 mg/dL — ABNORMAL HIGH (ref 70–99)
POTASSIUM: 5.4 meq/L — AB (ref 3.5–5.1)
Sodium: 137 mEq/L (ref 135–145)
TOTAL PROTEIN: 6.8 g/dL (ref 6.0–8.3)
Total Bilirubin: 0.4 mg/dL (ref 0.2–1.2)

## 2016-08-06 LAB — CBC WITH DIFFERENTIAL/PLATELET
Basophils Absolute: 0.1 10*3/uL (ref 0.0–0.1)
Basophils Relative: 0.6 % (ref 0.0–3.0)
EOS PCT: 2.3 % (ref 0.0–5.0)
Eosinophils Absolute: 0.2 10*3/uL (ref 0.0–0.7)
HCT: 45.4 % (ref 39.0–52.0)
Hemoglobin: 15.1 g/dL (ref 13.0–17.0)
LYMPHS PCT: 22.1 % (ref 12.0–46.0)
Lymphs Abs: 2.1 10*3/uL (ref 0.7–4.0)
MCHC: 33.2 g/dL (ref 30.0–36.0)
MCV: 91.2 fl (ref 78.0–100.0)
MONO ABS: 0.8 10*3/uL (ref 0.1–1.0)
Monocytes Relative: 8.7 % (ref 3.0–12.0)
Neutro Abs: 6.2 10*3/uL (ref 1.4–7.7)
Neutrophils Relative %: 66.3 % (ref 43.0–77.0)
Platelets: 217 10*3/uL (ref 150.0–400.0)
RBC: 4.98 Mil/uL (ref 4.22–5.81)
RDW: 14.4 % (ref 11.5–15.5)
WBC: 9.3 10*3/uL (ref 4.0–10.5)

## 2016-08-06 LAB — LIPASE: Lipase: 23 U/L (ref 11.0–59.0)

## 2016-08-06 MED ORDER — TRAMADOL HCL 50 MG PO TABS: 50.0000 mg | ORAL_TABLET | Freq: Three times a day (TID) | ORAL | 0 refills | Status: DC | PRN

## 2016-08-06 NOTE — Progress Notes (Signed)
BP 136/84   Pulse 78   Temp 98.1 F (36.7 C) (Oral)   Wt 300 lb (136.1 kg)   SpO2 94%   BMI 43.67 kg/m    CC: back pain Subjective:    Patient ID: [name], male    DOB: 05-Aug-1955, 61 y.o.   MRN: [id number]  HPI: [name] is a 61 y.o. male presenting on 08/06/2016 for Back Pain   See prior note for details. Seen here 07/26/2016 with several month h/o R flank pain and R upper buttock pain thought lumbar paraspinous mm strain. Urinalysis and renal US normal. Methocarbamol too sedating. icy hot patch not helpful. Overall no significant improvement.   Ongoing pain that starts at R lower back and radiates to L lower back and to RLQ of abdomen. Constant throbbing ache, with intermittent sharp stabbing pains. Has fallen twice due to "leg giving out" from R leg pain.   Noticing elevated sugars. Checks once or twice daily - initially 400s, now down to 240s.  Never returned iFOB 2016.  Relevant past medical, surgical, family and social history reviewed and updated as indicated. Interim medical history since our last visit reviewed. Allergies and medications reviewed and updated. Outpatient Medications Prior to Visit  Medication Sig Dispense Refill  . ACCU-CHEK FASTCLIX LANCETS MISC Check blood sugar once daily and as instructed. Dx 250.00 100 each 3  . aspirin EC 81 MG tablet Take 81 mg by mouth daily.    . Blood Glucose Monitoring Suppl (BLOOD GLUCOSE MONITOR SYSTEM) W/DEVICE KIT by Does not apply route. 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 carvedilol (COREG) 6.25 MG tablet TAKE 1 TABLET BY MOUTH 2 TIMES DAILY WITH A MEAL. 180 tablet 1  . fluticasone (FLONASE) 50 MCG/ACT nasal spray Place 2 sprays into both nostrils daily. 16 g 3  . furosemide (LASIX) 20 MG tablet TAKE 1 TABLET (20 MG TOTAL) BY MOUTH 2 (TWO) TIMES DAILY AS NEEDED. 90 tablet 3  . glimepiride (AMARYL) 2 MG tablet Take 1 tablet (2 mg total) by mouth daily with  breakfast. 90 tablet 0  . glucose blood (ONETOUCH VERIO) test strip Use to check sugar once daily and as needed. Dx: E11.9 100 each 3  . hydrOXYzine (ATARAX/VISTARIL) 25 MG tablet TAKE 1 TABLET (25 MG TOTAL) BY MOUTH 2 (TWO) TIMES DAILY AS NEEDED FOR ANXIETY. 60 tablet 1  . metFORMIN (GLUCOPHAGE) 500 MG tablet Take 1 tablet (500 mg total) by mouth 2 (two) times daily with a meal. 180 tablet 3  . methocarbamol (ROBAXIN) 500 MG tablet Take 1 tablet (500 mg total) by mouth 3 (three) times daily as needed for muscle spasms. 30 tablet 1  . Omega-3 Fatty Acids (FISH OIL) 1000 MG CAPS Take 2 capsules (2,000 mg total) by mouth daily.  0  . potassium chloride (KLOR-CON 10) 10 MEQ tablet TAKE 1 TABLET (10 MEQ TOTAL) BY MOUTH DAILY. 90 tablet 0  . Probiotic Product (PROBIOTIC DAILY PO) Take by mouth.    . simvastatin (ZOCOR) 20 MG tablet TAKE 1 TABLET (20 MG TOTAL) BY MOUTH EVERY EVENING. 90 tablet 0   No facility-administered medications prior to visit.      Per HPI unless specifically indicated in ROS section below Review of Systems     Objective:    BP 136/84   Pulse 78   Temp 98.1 F (36.7 C) (Oral)   Wt 300 lb (136.1 kg)  SpO2 94%   BMI 43.67 kg/m   Wt Readings from Last 3 Encounters:  08/06/16 300 lb (136.1 kg)  07/26/16 296 lb 12 oz (134.6 kg)  06/22/16 283 lb 4 oz (128.5 kg)    Physical Exam  Constitutional: He appears well-developed and well-nourished. No distress.  HENT:  Head: Normocephalic and atraumatic.  Mouth/Throat: Oropharynx is clear and moist. No oropharyngeal exudate.  Eyes: Conjunctivae are normal. Pupils are equal, round, and reactive to light.  Neck: Normal range of motion. Neck supple.  Cardiovascular: Normal rate, regular rhythm, normal heart sounds and intact distal pulses.   No murmur heard. Pulmonary/Chest: Effort normal and breath sounds normal. No respiratory distress. He has no wheezes. He has no rales.  Abdominal: Soft. Bowel sounds are normal. He  exhibits distension and fluid wave. He exhibits no mass. There is tenderness (mild RLQ). There is no rebound and no guarding. A hernia (large nontender umbilical hernia, not fully reducible) is present.  Musculoskeletal: He exhibits no edema.  No midline lumbar spine pain ++ R paraspinous mm tenderness/tightness to palpation Neg seated SLR, no pain with int/ext rotation at bilateral hips  Skin: Skin is warm and dry. No rash noted. No erythema.  Large circumscribed fatty growth L lower back  Psychiatric: He has a normal mood and affect.  Nursing note and vitals reviewed.  Results for orders placed or performed in visit on 08/06/16  Comprehensive metabolic panel  Result Value Ref Range   Sodium 137 135 - 145 mEq/L   Potassium 5.4 (H) 3.5 - 5.1 mEq/L   Chloride 98 96 - 112 mEq/L   CO2 36 (H) 19 - 32 mEq/L   Glucose, Bld 279 (H) 70 - 99 mg/dL   BUN 8 6 - 23 mg/dL   Creatinine, Ser 0.69 0.40 - 1.50 mg/dL   Total Bilirubin 0.4 0.2 - 1.2 mg/dL   Alkaline Phosphatase 78 39 - 117 U/L   AST 48 (H) 0 - 37 U/L   ALT 38 0 - 53 U/L   Total Protein 6.8 6.0 - 8.3 g/dL   Albumin 3.9 3.5 - 5.2 g/dL   Calcium 9.2 8.4 - 10.5 mg/dL   GFR 123.94 >60.00 mL/min  CBC with Differential/Platelet  Result Value Ref Range   WBC 9.3 4.0 - 10.5 K/uL   RBC 4.98 4.22 - 5.81 Mil/uL   Hemoglobin 15.1 13.0 - 17.0 g/dL   HCT 45.4 39.0 - 52.0 %   MCV 91.2 78.0 - 100.0 fl   MCHC 33.2 30.0 - 36.0 g/dL   RDW 14.4 11.5 - 15.5 %   Platelets 217.0 150.0 - 400.0 K/uL   Neutrophils Relative % 66.3 43.0 - 77.0 %   Lymphocytes Relative 22.1 12.0 - 46.0 %   Monocytes Relative 8.7 3.0 - 12.0 %   Eosinophils Relative 2.3 0.0 - 5.0 %   Basophils Relative 0.6 0.0 - 3.0 %   Neutro Abs 6.2 1.4 - 7.7 K/uL   Lymphs Abs 2.1 0.7 - 4.0 K/uL   Monocytes Absolute 0.8 0.1 - 1.0 K/uL   Eosinophils Absolute 0.2 0.0 - 0.7 K/uL   Basophils Absolute 0.1 0.0 - 0.1 K/uL  Lipase  Result Value Ref Range   Lipase 23.0 11.0 - 59.0 U/L    Lab Results  Component Value Date   CREATININE 0.69 08/06/2016    Lab Results  Component Value Date   HGBA1C 9.0 (H) 05/06/2016   RENAL / URINARY TRACT ULTRASOUND COMPLETE COMPARISON:  Abdominal ultrasound 02/19/2012 FINDINGS:  Right Kidney: Length: 14.6 cm. Normal cortical thickness and echogenicity. No mass, hydronephrosis or shadowing calcification. Left Kidney: Length: 13.0 cm. Normal cortical thickness and echogenicity. No mass, hydronephrosis or shadowing calcification. Bladder: Appears normal for degree of bladder distention. Incidentally noted increased hepatic echogenicity question fatty infiltration though this can be seen with cirrhosis uncertain infiltrative disorders, unchanged. IMPRESSION: Normal sonographic appearance of the kidneys. Question fatty infiltration of liver as above. Electronically Signed   By: Lavonia Dana M.D.   On: 07/29/2016 16:47    Assessment & Plan:   Problem List Items Addressed This Visit    BMI 40.0-44.9, adult ([place])    Ongoing weight gain noted. Likely contributing to ongoing pain.       Chronic systolic CHF (congestive heart failure) ([place])   Diabetes mellitus type 2 with complications, uncontrolled ([place])    Remains persistently uncontrolled. He has f/u next month. Will need to strongly consider insulin.       Fatty liver    Further eval with CT with/without contrast. Presumed HS.       Relevant Orders   Comprehensive metabolic panel (Completed)   CBC with Differential/Platelet (Completed)   Lipase (Completed)   CT ABDOMEN PELVIS W CONTRAST   Lipoma    Large at L flank - doubt related to current symptoms. Will see if upcoming CT scan evaluates this area.       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 No recent cards f/u.       Right flank pain - Primary    Ongoing pain previously thought lumbar paraspinous mm strain but not improved with conservative  treatment of heating pad and muscle relaxant. Avoiding NSAIDs due to CAD hx. UA without blood, recent renal US returned reassuringly ok.  Today with ongoing reproducible tenderness to palpation of R lumbar paraspinous mm and R flank - again consistent with musculoskeletal pain - will treat with tramadol.  Given ongoing pain however, will also obtain CT to further evaluate R flank region.       Relevant Orders   Comprehensive metabolic panel (Completed)   CBC with Differential/Platelet (Completed)   Lipase (Completed)   CT ABDOMEN PELVIS W CONTRAST   Umbilical hernia without obstruction and without gangrene    Large. Not painful but not fully reducible. Further eval with CT abd/pelvis.       Relevant Orders   CT ABDOMEN PELVIS W CONTRAST       Follow up plan: Return if symptoms worsen or fail to improve.  [name], MD

## 2016-08-06 NOTE — Patient Instructions (Signed)
Try stronger pain medicine temporarily - tramadol.  See Rosaria Ferries on your way out for referral for CT scan of abdomen and pelvic.  Continue heating pad to back, robaxin as needed. We will be in touch with results.

## 2016-08-06 NOTE — Progress Notes (Signed)
Pre visit review using our clinic review tool, if applicable. No additional management support is needed unless otherwise documented below in the visit note. 

## 2016-08-07 ENCOUNTER — Other Ambulatory Visit: Payer: Self-pay | Admitting: Family Medicine

## 2016-08-07 NOTE — Assessment & Plan Note (Signed)
Large. Not painful but not fully reducible. Further eval with CT abd/pelvis.

## 2016-08-07 NOTE — Assessment & Plan Note (Addendum)
Further eval with CT with/without contrast. Presumed HS.

## 2016-08-07 NOTE — Assessment & Plan Note (Signed)
Remains persistently uncontrolled. He has f/u next month. Will need to strongly consider insulin.

## 2016-08-07 NOTE — Assessment & Plan Note (Signed)
Large at L flank - doubt related to current symptoms. Will see if upcoming CT scan evaluates this area.

## 2016-08-07 NOTE — Assessment & Plan Note (Signed)
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 No recent cards f/u.

## 2016-08-07 NOTE — Assessment & Plan Note (Signed)
Ongoing weight gain noted. Likely contributing to ongoing pain.

## 2016-08-07 NOTE — Assessment & Plan Note (Signed)
Ongoing pain previously thought lumbar paraspinous mm strain but not improved with conservative treatment of heating pad and muscle relaxant. Avoiding NSAIDs due to CAD hx. UA without blood, recent renal US returned reassuringly ok.  Today with ongoing reproducible tenderness to palpation of R lumbar paraspinous mm and R flank - again consistent with musculoskeletal pain - will treat with tramadol.  Given ongoing pain however, will also obtain CT to further evaluate R flank region.

## 2016-08-11 ENCOUNTER — Ambulatory Visit (INDEPENDENT_AMBULATORY_CARE_PROVIDER_SITE_OTHER)
Admission: RE | Admit: 2016-08-11 | Discharge: 2016-08-11 | Disposition: A | Discharge status: Home / Self Care | Payer: Medicare Other | Source: Ambulatory Visit | Attending: Family Medicine | Admitting: Family Medicine

## 2016-08-11 DIAGNOSIS — K760 Fatty (change of) liver, not elsewhere classified: Secondary | ICD-10-CM | POA: Diagnosis not present

## 2016-08-11 DIAGNOSIS — K429 Umbilical hernia without obstruction or gangrene: Secondary | ICD-10-CM

## 2016-08-11 DIAGNOSIS — R109 Unspecified abdominal pain: Secondary | ICD-10-CM | POA: Diagnosis not present

## 2016-08-11 DIAGNOSIS — I7143 Infrarenal abdominal aortic aneurysm, without rupture: Secondary | ICD-10-CM

## 2016-08-11 HISTORY — DX: Infrarenal abdominal aortic aneurysm, without rupture: I71.43

## 2016-08-11 MED ORDER — IOPAMIDOL (ISOVUE-300) INJECTION 61%
100.0000 mL | Freq: Once | INTRAVENOUS | Status: AC | PRN
  Administered 2016-08-11: 100 mL via INTRAVENOUS

## 2016-08-12 ENCOUNTER — Telehealth: Payer: Self-pay

## 2016-08-12 NOTE — Telephone Encounter (Signed)
Pt will be notified when results are ready.

## 2016-08-12 NOTE — Telephone Encounter (Signed)
Pt request refill metformin; I spoke with Vicente Males at [facility] and metformin is being prepared now. Pt voiced understanding. Pt request CT of abd results from 08/11/16. Advised pt Dr Darnell Level has not reviewed yet and pt request cb when results are available.

## 2016-08-13 ENCOUNTER — Encounter: Payer: Self-pay | Admitting: Family Medicine

## 2016-08-13 ENCOUNTER — Telehealth: Payer: Self-pay | Admitting: Family Medicine

## 2016-08-13 DIAGNOSIS — D3502 Benign neoplasm of left adrenal gland: Secondary | ICD-10-CM | POA: Insufficient documentation

## 2016-08-13 DIAGNOSIS — I77819 Aortic ectasia, unspecified site: Secondary | ICD-10-CM

## 2016-08-13 DIAGNOSIS — R911 Solitary pulmonary nodule: Secondary | ICD-10-CM | POA: Insufficient documentation

## 2016-08-13 NOTE — Telephone Encounter (Signed)
Opened in error

## 2016-08-13 NOTE — Telephone Encounter (Signed)
Pt called back for results. He was told he would be called within hrs of CT scan.

## 2016-08-25 NOTE — Telephone Encounter (Signed)
Scheduled 10/04/16

## 2016-09-08 ENCOUNTER — Telehealth: Payer: Self-pay | Admitting: Family Medicine

## 2016-09-08 NOTE — Telephone Encounter (Signed)
Noted. Will see then. Known COPD consider spirometry

## 2016-09-08 NOTE — Telephone Encounter (Signed)
I spoke with pt and he is not having a problem breathing now; no distress; pt said when in the air conditioning does not have a problem but when is in the heat does have labored breathing which pt has had for a long time . Pt also has problems with allergies when he is outside. Pt already has appt with Dr Darnell Level on 09/10/16 and if condition worsens prior to that appt pt will go to ED. Dr Darnell Level out of office. FYI to Dr Damita Dunnings.

## 2016-09-08 NOTE — Telephone Encounter (Signed)
[facility] Patient Name: [name] [facility] DOB: March 01, 1956 Initial Comment Caller is having a hard time breathing Nurse Assessment Nurse: Martyn Ehrich, RN, Felicia Date/Time (Eastern Time): 09/08/2016 3:21:12 PM Confirm and document reason for call. If symptomatic, describe symptoms. ---PT has COPD and terrible allergies - breathing is getting worse and worse. He does not use albuterol bc it is not covered by insurance. No fever. Feels like fluid is in lungs. When outside he has to move very slow. He wonders if he can have oxygen - speaking in long sentences. MD has ordered albuterol but pt has not purchased it. He is on disability Does the patient have any new or worsening symptoms? ---Yes Will a triage be completed? ---Yes Related visit to physician within the last 2 weeks? ---NoDoes the PT have any chronic conditions? (i.e. diabetes, asthma, etc.) ---Yes List chronic conditions. ---COPD and seasonal allergies, DM doesnt think he has asthma. Disability was started for anxiety 2016 and now has heart condition. CHF (on water pill and ankles and feet are not swollen) Is this a behavioral health or substance abuse call? ---No Guidelines Guideline Title Affirmed Question Affirmed Notes Chest Pain Difficulty breathing Final Disposition User Go to ED Now Martyn Ehrich, RN, [place] Comments dry cough not sure that fluid is in his lungs he cant afford albuterol - talking in long expressive sentences and he has quit smoking he wants [place] to know - he feels this is allergies and wants an appointment to see if he can have oxygen at home -told him the office will be notified and they will call him back but cant guarantee when He can walk without holding onto things today he feels his symptoms get a lot worse the minute he goes outside - he feels it is seasonal allergy related ocassional sharp chest pains that are a  few seconds at a timeComments left generic message at Triage nurse line after Vaughan Basta transferred this nurse to triage nurse line at the office and voice mail asked for a message - Left general message for her to call this nurse back Referrals [place] REFUSEDDisagree/Comply: Disagree Disagree/Comply Reason: Disagree with instructions Call Id: [id number] will try again to reach triage nurse

## 2016-09-08 NOTE — Telephone Encounter (Signed)
This sounds reasonable.  Routed to PCP.

## 2016-09-10 ENCOUNTER — Ambulatory Visit (INDEPENDENT_AMBULATORY_CARE_PROVIDER_SITE_OTHER): Payer: Medicare Other | Admitting: Family Medicine

## 2016-09-10 ENCOUNTER — Encounter: Payer: Self-pay | Admitting: Family Medicine

## 2016-09-10 VITALS — BP 108/72 | HR 83 | Temp 98.4°F | Wt 293.0 lb

## 2016-09-10 DIAGNOSIS — R0602 Shortness of breath: Secondary | ICD-10-CM

## 2016-09-10 DIAGNOSIS — J302 Other seasonal allergic rhinitis: Secondary | ICD-10-CM | POA: Diagnosis not present

## 2016-09-10 DIAGNOSIS — J984 Other disorders of lung: Secondary | ICD-10-CM

## 2016-09-10 DIAGNOSIS — J449 Chronic obstructive pulmonary disease, unspecified: Secondary | ICD-10-CM | POA: Diagnosis not present

## 2016-09-10 DIAGNOSIS — G4733 Obstructive sleep apnea (adult) (pediatric): Secondary | ICD-10-CM

## 2016-09-10 MED ORDER — ALBUTEROL SULFATE (2.5 MG/3ML) 0.083% IN NEBU
2.5000 mg | INHALATION_SOLUTION | Freq: Once | RESPIRATORY_TRACT | Status: AC
  Administered 2016-09-10: 2.5 mg via RESPIRATORY_TRACT

## 2016-09-10 MED ORDER — MONTELUKAST SODIUM 10 MG PO TABS: 10.0000 mg | ORAL_TABLET | Freq: Every day | ORAL | 3 refills | Status: DC

## 2016-09-10 NOTE — Progress Notes (Signed)
Pre visit review using our clinic review tool, if applicable. No additional management support is needed unless otherwise documented below in the visit note. 

## 2016-09-10 NOTE — Progress Notes (Addendum)
BP 108/72   Pulse 69   Temp 98.4 F (36.9 C)   Wt 293 lb (132.9 kg)   SpO2 99%   BMI 42.65 kg/m    CC: dyspnea Subjective:    Patient ID: [name], male    DOB: [birth year], 61 y.o.   MRN: [id number]  HPI: [name] is a 61 y.o. male presenting on 09/10/2016 for Shortness of Breath (no energy)   See recent phone note.  3-4d h/o dyspnea worse when outdoors along with profound fatigue. Initially attributed to allergies. No energy when walking 20 feet. Even short winded when mowing lawn on ride mower - short winded when outdoors. Allergy medicine (tylenol and antihistamine) helps temporarily. Wakes up with headache - has not been using CPAP machine - trouble tolerating machine. Head and chest congestion. Ongoing mildly productive cough, some wheezing, dyspnea. Dyspneic with any exertion. Denies rhinorrhea, sneezing or itchy watery eyes.  Flonase has been ineffective.  Albuterol didn't seem to help.  Sitting and resting, along with hydroxyzine, does seem to help.   Ongoing R flank pain with unrevealing evaluation - thought muscular pain. Tramadol, muscle relaxant has not been helpful - has stopped these. Finds aleve is more helpful.   Relevant past medical, surgical, family and social history reviewed and updated as indicated. Interim medical history since our last visit reviewed. Allergies and medications reviewed and updated. Outpatient Medications Prior to Visit  Medication Sig Dispense Refill  . ACCU-CHEK FASTCLIX LANCETS MISC Check blood sugar once daily and as instructed. Dx 250.00 100 each 3  . aspirin EC 81 MG tablet Take 81 mg by mouth daily.    . Blood Glucose Monitoring Suppl (BLOOD GLUCOSE MONITOR SYSTEM) W/DEVICE KIT by Does not apply route. 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 carvedilol (COREG) 6.25 MG tablet TAKE 1 TABLET BY MOUTH 2 TIMES DAILY WITH A MEAL. 180 tablet 1  . fluticasone (FLONASE) 50 MCG/ACT nasal  spray Place 2 sprays into both nostrils daily. 16 g 3  . furosemide (LASIX) 20 MG tablet TAKE 1 TABLET (20 MG TOTAL) BY MOUTH 2 (TWO) TIMES DAILY AS NEEDED. 90 tablet 3  . glimepiride (AMARYL) 2 MG tablet Take 1 tablet (2 mg total) by mouth daily with breakfast. 90 tablet 0  . glucose blood (ONETOUCH VERIO) test strip Use to check sugar once daily and as needed. Dx: E11.9 100 each 3  . hydrOXYzine (ATARAX/VISTARIL) 25 MG tablet TAKE 1 TABLET (25 MG TOTAL) BY MOUTH 2 (TWO) TIMES DAILY AS NEEDED FOR ANXIETY. 60 tablet 1  . metFORMIN (GLUCOPHAGE) 500 MG tablet Take 1 tablet (500 mg total) by mouth 2 (two) times daily with a meal. 180 tablet 3  . Omega-3 Fatty Acids (FISH OIL) 1000 MG CAPS Take 2 capsules (2,000 mg total) by mouth daily.  0  . potassium chloride (KLOR-CON 10) 10 MEQ tablet TAKE 1 TABLET (10 MEQ TOTAL) BY MOUTH DAILY. 90 tablet 0  . Probiotic Product (PROBIOTIC DAILY PO) Take by mouth.    . simvastatin (ZOCOR) 20 MG tablet TAKE 1 TABLET (20 MG TOTAL) BY MOUTH EVERY EVENING. 90 tablet 1  . methocarbamol (ROBAXIN) 500 MG tablet Take 1 tablet (500 mg total) by mouth 3 (three) times daily as needed for muscle spasms. (Patient not taking: Reported on 09/10/2016) 30 tablet 1  . traMADol (ULTRAM) 50 MG tablet Take 1 tablet (50 mg total) by mouth every 8 (eight)  hours as needed. (Patient not taking: Reported on 09/10/2016) 30 tablet 0   No facility-administered medications prior to visit.      Per HPI unless specifically indicated in ROS section below Review of Systems     Objective:    BP 108/72   Pulse 69   Temp 98.4 F (36.9 C)   Wt 293 lb (132.9 kg)   SpO2 99%   BMI 42.65 kg/m   Wt Readings from Last 3 Encounters:  09/10/16 293 lb (132.9 kg)  08/06/16 300 lb (136.1 kg)  07/26/16 296 lb 12 oz (134.6 kg)    Physical Exam  Constitutional: He appears well-developed and well-nourished. No distress.  HENT:  Head: Normocephalic and atraumatic.  Right Ear: Hearing, tympanic  membrane, external ear and ear canal normal.  Left Ear: Hearing, tympanic membrane, external ear and ear canal normal.  Nose: Mucosal edema (nasal congestion) present. No rhinorrhea. Right sinus exhibits no maxillary sinus tenderness and no frontal sinus tenderness. Left sinus exhibits no maxillary sinus tenderness and no frontal sinus tenderness.  Mouth/Throat: Uvula is midline, oropharynx is clear and moist and mucous membranes are normal. No oropharyngeal exudate, posterior oropharyngeal edema, posterior oropharyngeal erythema or tonsillar abscesses.  Eyes: Conjunctivae and EOM are normal. Pupils are equal, round, and reactive to light. No scleral icterus.  Neck: Normal range of motion. Neck supple.  Cardiovascular: Normal rate, regular rhythm, normal heart sounds and intact distal pulses.   No murmur heard. Pulmonary/Chest: Effort normal. No respiratory distress. He has decreased breath sounds. He has no wheezes. He has no rhonchi. He has no rales.  Upper airway wheezing Crackles bibasilarly  Lymphadenopathy:    He has no cervical adenopathy.  Skin: Skin is warm and dry. No rash noted.  Nursing note and vitals reviewed.  Results for orders placed or performed in visit on 08/06/16  Comprehensive metabolic panel  Result Value Ref Range   Sodium 137 135 - 145 mEq/L   Potassium 5.4 (H) 3.5 - 5.1 mEq/L   Chloride 98 96 - 112 mEq/L   CO2 36 (H) 19 - 32 mEq/L   Glucose, Bld 279 (H) 70 - 99 mg/dL   BUN 8 6 - 23 mg/dL   Creatinine, Ser 0.69 0.40 - 1.50 mg/dL   Total Bilirubin 0.4 0.2 - 1.2 mg/dL   Alkaline Phosphatase 78 39 - 117 U/L   AST 48 (H) 0 - 37 U/L   ALT 38 0 - 53 U/L   Total Protein 6.8 6.0 - 8.3 g/dL   Albumin 3.9 3.5 - 5.2 g/dL   Calcium 9.2 8.4 - 10.5 mg/dL   GFR 123.94 >60.00 mL/min  CBC with Differential/Platelet  Result Value Ref Range   WBC 9.3 4.0 - 10.5 K/uL   RBC 4.98 4.22 - 5.81 Mil/uL   Hemoglobin 15.1 13.0 - 17.0 g/dL   HCT 45.4 39.0 - 52.0 %   MCV 91.2 78.0  - 100.0 fl   MCHC 33.2 30.0 - 36.0 g/dL   RDW 14.4 11.5 - 15.5 %   Platelets 217.0 150.0 - 400.0 K/uL   Neutrophils Relative % 66.3 43.0 - 77.0 %   Lymphocytes Relative 22.1 12.0 - 46.0 %   Monocytes Relative 8.7 3.0 - 12.0 %   Eosinophils Relative 2.3 0.0 - 5.0 %   Basophils Relative 0.6 0.0 - 3.0 %   Neutro Abs 6.2 1.4 - 7.7 K/uL   Lymphs Abs 2.1 0.7 - 4.0 K/uL   Monocytes Absolute 0.8 0.1 - 1.0  K/uL   Eosinophils Absolute 0.2 0.0 - 0.7 K/uL   Basophils Absolute 0.1 0.0 - 0.1 K/uL  Lipase  Result Value Ref Range   Lipase 23.0 11.0 - 59.0 U/L      Assessment & Plan:   Problem List Items Addressed This Visit    Allergic rhinitis, seasonal    Acutely worse. Will trial singulair, continue flonase and antihistamine (he takes OTC). Discussed allergen avoidance. He has dogs and cats at home ?pet dander contribution.       Dyspnea - Primary    Ambulatory pulse ox ok. Spirometry today with restrictive pattern, FVC 50%. No improvement with albuterol treatment.  Will refer to pulm for further evaluation. Pt agrees with plan.       Relevant Medications   albuterol (PROVENTIL) (2.5 MG/3ML) 0.083% nebulizer solution 2.5 mg (Completed)   Other Relevant Orders   Spirometry: Pre & Post Eval (Completed)   Ambulatory referral to Pulmonology   Obstructive sleep apnea    Ongoing trouble with tolerating CPAP machine. Currently has no sleep doctor - will refer to pulm to establish for OSA on CPAP.       Relevant Orders   Ambulatory referral to Pulmonology   Restrictive lung disease    Presumed h/o COPD from smoking history however spirometry today with more of a restrictive pattern. Reviewed latest CXR and imaging studies with patient. Discussed obesity contribution.  Will refer to pulm.       Relevant Orders   Ambulatory referral to Pulmonology       Follow up plan: Return if symptoms worsen or fail to improve.  [name], MD

## 2016-09-10 NOTE — Addendum Note (Signed)
Addended by: [name] on: 09/10/2016 02:06 PM   Modules accepted: Orders

## 2016-09-10 NOTE — Patient Instructions (Addendum)
Spirometry today. Ambulatory pulse ox today. Start singulair daily for allergies.  Avoid allergens - wear mask when mowing yard, then come in doors and shower right away. Use nasal saline irrigation throughout the day.  Keep follow up next week.

## 2016-09-10 NOTE — Assessment & Plan Note (Signed)
Presumed h/o COPD from smoking history however spirometry today with more of a restrictive pattern. Reviewed latest CXR and imaging studies with patient. Discussed obesity contribution.  Will refer to pulm.

## 2016-09-10 NOTE — Assessment & Plan Note (Addendum)
Ambulatory pulse ox ok. Spirometry today with restrictive pattern, FVC 50%. No improvement with albuterol treatment.  Will refer to pulm for further evaluation. Pt agrees with plan.

## 2016-09-10 NOTE — Assessment & Plan Note (Signed)
Ongoing trouble with tolerating CPAP machine. Currently has no sleep doctor - will refer to pulm to establish for OSA on CPAP.

## 2016-09-10 NOTE — Assessment & Plan Note (Signed)
Acutely worse. Will trial singulair, continue flonase and antihistamine (he takes OTC). Discussed allergen avoidance. He has dogs and cats at home ?pet dander contribution.

## 2016-09-14 ENCOUNTER — Ambulatory Visit: Payer: Medicare Other | Admitting: Family Medicine

## 2016-09-15 DIAGNOSIS — I4892 Unspecified atrial flutter: Secondary | ICD-10-CM | POA: Diagnosis not present

## 2016-09-15 DIAGNOSIS — I714 Abdominal aortic aneurysm, without rupture, unspecified: Secondary | ICD-10-CM | POA: Insufficient documentation

## 2016-09-15 DIAGNOSIS — R0602 Shortness of breath: Secondary | ICD-10-CM | POA: Diagnosis not present

## 2016-09-15 DIAGNOSIS — G4731 Primary central sleep apnea: Secondary | ICD-10-CM | POA: Diagnosis not present

## 2016-09-15 DIAGNOSIS — I4891 Unspecified atrial fibrillation: Secondary | ICD-10-CM | POA: Diagnosis not present

## 2016-10-02 ENCOUNTER — Other Ambulatory Visit: Payer: Self-pay | Admitting: Family Medicine

## 2016-10-02 DIAGNOSIS — Z1159 Encounter for screening for other viral diseases: Secondary | ICD-10-CM

## 2016-10-02 DIAGNOSIS — E785 Hyperlipidemia, unspecified: Secondary | ICD-10-CM

## 2016-10-02 DIAGNOSIS — Z125 Encounter for screening for malignant neoplasm of prostate: Secondary | ICD-10-CM

## 2016-10-02 DIAGNOSIS — K760 Fatty (change of) liver, not elsewhere classified: Secondary | ICD-10-CM

## 2016-10-02 DIAGNOSIS — D3502 Benign neoplasm of left adrenal gland: Secondary | ICD-10-CM

## 2016-10-02 DIAGNOSIS — E118 Type 2 diabetes mellitus with unspecified complications: Secondary | ICD-10-CM

## 2016-10-02 DIAGNOSIS — IMO0002 Reserved for concepts with insufficient information to code with codable children: Secondary | ICD-10-CM

## 2016-10-02 DIAGNOSIS — Z114 Encounter for screening for human immunodeficiency virus [HIV]: Secondary | ICD-10-CM

## 2016-10-02 DIAGNOSIS — E1165 Type 2 diabetes mellitus with hyperglycemia: Secondary | ICD-10-CM

## 2016-10-04 ENCOUNTER — Other Ambulatory Visit: Payer: Self-pay | Admitting: Family Medicine

## 2016-10-04 ENCOUNTER — Ambulatory Visit (INDEPENDENT_AMBULATORY_CARE_PROVIDER_SITE_OTHER): Payer: Medicare Other

## 2016-10-04 VITALS — BP 112/82 | HR 68 | Temp 97.8°F | Ht 70.0 in | Wt 291.2 lb

## 2016-10-04 DIAGNOSIS — Z0000 Encounter for general adult medical examination without abnormal findings: Secondary | ICD-10-CM | POA: Diagnosis not present

## 2016-10-04 DIAGNOSIS — E1165 Type 2 diabetes mellitus with hyperglycemia: Secondary | ICD-10-CM

## 2016-10-04 DIAGNOSIS — E785 Hyperlipidemia, unspecified: Secondary | ICD-10-CM

## 2016-10-04 DIAGNOSIS — Z125 Encounter for screening for malignant neoplasm of prostate: Secondary | ICD-10-CM | POA: Diagnosis not present

## 2016-10-04 DIAGNOSIS — Z114 Encounter for screening for human immunodeficiency virus [HIV]: Secondary | ICD-10-CM

## 2016-10-04 DIAGNOSIS — IMO0002 Reserved for concepts with insufficient information to code with codable children: Secondary | ICD-10-CM

## 2016-10-04 DIAGNOSIS — Z1159 Encounter for screening for other viral diseases: Secondary | ICD-10-CM

## 2016-10-04 DIAGNOSIS — E118 Type 2 diabetes mellitus with unspecified complications: Secondary | ICD-10-CM

## 2016-10-04 DIAGNOSIS — K760 Fatty (change of) liver, not elsewhere classified: Secondary | ICD-10-CM

## 2016-10-04 LAB — COMPREHENSIVE METABOLIC PANEL
ALBUMIN: 4.1 g/dL (ref 3.5–5.2)
ALK PHOS: 60 U/L (ref 39–117)
ALT: 25 U/L (ref 0–53)
AST: 30 U/L (ref 0–37)
BILIRUBIN TOTAL: 0.3 mg/dL (ref 0.2–1.2)
BUN: 4 mg/dL — AB (ref 6–23)
CHLORIDE: 100 meq/L (ref 96–112)
CO2: 31 mEq/L (ref 19–32)
CREATININE: 0.58 mg/dL (ref 0.40–1.50)
Calcium: 9.5 mg/dL (ref 8.4–10.5)
GFR: 151.36 mL/min (ref >60.00)
Glucose, Bld: 185 mg/dL — ABNORMAL HIGH (ref 70–99)
Potassium: 4.2 mEq/L (ref 3.5–5.1)
SODIUM: 138 meq/L (ref 135–145)
TOTAL PROTEIN: 6.7 g/dL (ref 6.0–8.3)

## 2016-10-04 LAB — HEMOGLOBIN A1C: HEMOGLOBIN A1C: 8.5 % — AB (ref 4.6–6.5)

## 2016-10-04 LAB — LIPID PANEL
Cholesterol: 131 mg/dL (ref 0–200)
HDL: 33 mg/dL — ABNORMAL LOW (ref >39.00)
NonHDL: 98.21
Total CHOL/HDL Ratio: 4
Triglycerides: 210 mg/dL — ABNORMAL HIGH (ref 0.0–149.0)
VLDL: 42 mg/dL — ABNORMAL HIGH (ref 0.0–40.0)

## 2016-10-04 LAB — MICROALBUMIN / CREATININE URINE RATIO
CREATININE, U: 111 mg/dL
MICROALB UR: 0.9 mg/dL (ref 0.0–1.9)
MICROALB/CREAT RATIO: 0.8 mg/g (ref 0.0–30.0)

## 2016-10-04 LAB — PSA: PSA: 0.85 ng/mL (ref 0.10–4.00)

## 2016-10-04 LAB — LDL CHOLESTEROL, DIRECT: LDL DIRECT: 74 mg/dL

## 2016-10-04 NOTE — Progress Notes (Signed)
I reviewed health advisor's note, was available for consultation, and agree with documentation and plan.  

## 2016-10-04 NOTE — Telephone Encounter (Signed)
CPE#1 today, last fill #90 w/  3 refills. OK to refill?

## 2016-10-04 NOTE — Progress Notes (Signed)
PCP notes:   Health maintenance:  Tetanus - postponed/insurance Colon cancer screening - PCP please address at CPE Eye exam - per pt, exam in June 2017 Microalbumin - completed Hep C screening - completed HIV screening - completed  Abnormal screenings:   Hearing - failed Fall risk - hx of multiple falls with injury but no medical treatment  Patient concerns:   None  Nurse concerns:  None  Next PCP appt:   10/14/16 @ 1130

## 2016-10-04 NOTE — Progress Notes (Signed)
Pre visit review using our clinic review tool, if applicable. No additional management support is needed unless otherwise documented below in the visit note. 

## 2016-10-04 NOTE — Patient Instructions (Addendum)
[name] , Thank you for taking time to come for your Medicare Wellness Visit. I appreciate your ongoing commitment to your health goals. Please review the following plan we discussed and let me know if I can assist you in the future.   These are the goals we discussed: Goals    . weight management (pt-stated)          Starting 10/04/2016, I will continue to increase intake of fish and vegetables as well as decrease intake of sugar and bread in an effort to lose weight. Target weight is 250 lbs.        This is a list of the screening recommended for you and due dates:  Health Maintenance  Topic Date Due  . DTaP/Tdap/Td vaccine (1 - Tdap) 04/25/2026*  . Tetanus Vaccine  04/25/2026*  . Colon Cancer Screening  10/05/2026*  . Eye exam for diabetics  10/08/2016  . Hemoglobin A1C  11/03/2016  . Flu Shot  11/24/2016  . Complete foot exam   06/04/2017  . Urine Protein Check  10/04/2017  . Pneumococcal vaccine (2) 02/24/2018  .  Hepatitis C: One time screening is recommended by Center for Disease Control  (CDC) for  adults born from [birth year] through 1965.   Completed  . HIV Screening  Completed  *Topic was postponed. The date shown is not the original due date.    Preventive Care of Adults  A healthy lifestyle and preventive care can promote health and wellness. Preventive health guidelines for adults include the following key practices.  . A routine yearly physical is a good way to check with your health care provider about your health and preventive screening. It is a chance to share any concerns and updates on your health and to receive a thorough exam.  . Visit your dentist for a routine exam and preventive care every 6 months. Brush your teeth twice a day and floss once a day. Good oral hygiene prevents tooth decay and gum disease.  . The frequency of eye exams is based on your age, health, family medical history, use  of contact lenses, and other factors. Follow your health care  provider's ecommendations for frequency of eye exams.  . Eat a healthy diet. Foods like vegetables, fruits, whole grains, low-fat dairy products, and lean protein foods contain the nutrients you need without too many calories. Decrease your intake of foods high in solid fats, added sugars, and salt. Eat the right amount of calories for you. Get information about a proper diet from your health care provider, if necessary.  . Regular physical exercise is one of the most important things you can do for your health. Most adults should get at least 150 minutes of moderate-intensity exercise (any activity that increases your heart rate and causes you to sweat) each week. In addition, most adults need muscle-strengthening exercises on 2 or more days a week.  Silver Sneakers may be a benefit available to you. To determine eligibility, you may visit the website: www.silversneakers.com or contact program at [phone] Mon-Fri between 8AM-8PM.   . Maintain a healthy weight. The body mass index (BMI) is a screening tool to identify possible weight problems. It provides an estimate of body fat based on height and weight. Your health care provider can find your BMI and can help you achieve or maintain a healthy weight.   For adults 20 years and older: ? A BMI below 18.5 is considered underweight. ? A BMI of 18.5 to 24.9 is normal. ?  A BMI of 25 to 29.9 is considered overweight. ? A BMI of 30 and above is considered obese.   . Maintain normal blood lipids and cholesterol levels by exercising and minimizing your intake of saturated fat. Eat a balanced diet with plenty of fruit and vegetables. Blood tests for lipids and cholesterol should begin at age 36 and be repeated every 5 years. If your lipid or cholesterol levels are high, you are over 50, or you are at high risk for heart disease, you may need your cholesterol levels checked more frequently. Ongoing high lipid and cholesterol levels should be treated  with medicines if diet and exercise are not working.  . If you smoke, find out from your health care provider how to quit. If you do not use tobacco, please do not start.  . If you choose to drink alcohol, please do not consume more than 2 drinks per day. One drink is considered to be 12 ounces (355 mL) of beer, 5 ounces (148 mL) of wine, or 1.5 ounces (44 mL) of liquor.  . If you are 3-39 years old, ask your health care provider if you should take aspirin to prevent strokes.  . Use sunscreen. Apply sunscreen liberally and repeatedly throughout the day. You should seek shade when your shadow is shorter than you. Protect yourself by wearing long sleeves, pants, a wide-brimmed hat, and sunglasses year round, whenever you are outdoors.  . Once a month, do a whole body skin exam, using a mirror to look at the skin on your back. Tell your health care provider of new moles, moles that have irregular borders, moles that are larger than a pencil eraser, or moles that have changed in shape or color.

## 2016-10-04 NOTE — Progress Notes (Signed)
Subjective:   [name] is a 61 y.o. male who presents for Medicare Annual (Subsequent) preventive examination.  Review of Systems:  N/A Cardiac Risk Factors include: advanced age (>23mn, >>54women);obesity (BMI >30kg/m2);sedentary lifestyle;male gender;diabetes mellitus     Objective:     Vitals: BP 112/82 (BP Location: Right Arm, Patient Position: Sitting, Cuff Size: Normal)   Pulse 68   Temp 97.8 F (36.6 C) (Oral)   Ht '5\' 10"'  (1.778 m) Comment: no shoes  Wt 291 lb 4 oz (132.1 kg)   SpO2 93%   BMI 41.79 kg/m   Body mass index is 41.79 kg/m.   Tobacco History  Smoking Status  . Former Smoker  . Packs/day: 1.00  . Years: 12.00  . Types: Cigarettes  . Start date: 04/26/1968  . Quit date: 04/26/2012  Smokeless Tobacco  . Never Used     Counseling given: No   Past Medical History:  Diagnosis Date  . Abnormal drug screen 2015   MJ positive x3 - one more and we will stop prescribing ativan (08/2013).  . Angina   . Anxiety   . Arthritis   . CAD (coronary artery disease)    nonobstructive  . Cannabis abuse   . Chronic systolic CHF (congestive heart failure) ([place] 2013   NYHA Class II/III  . COPD (chronic obstructive pulmonary disease) ([place]   . Diabetes type 2, uncontrolled ([facility]    declines DSME  . Dilated cardiomyopathy ([facility] 2013   now improved  . Ex-smoker   . Frequent headaches   . History of atrial fibrillation 2013   chronic, s/p ablation prior on coumadin  . Hyperlipidemia   . Hypertension   . Migraine   . Nonischemic cardiomyopathy ([facility] 2013   EF 25% per Dr KNehemiah Massed . Obesity   . OSA (obstructive sleep apnea)    does not use CPAP - unable to tolerate  . Seasonal allergies    Past Surgical History:  Procedure Laterality Date  . ATRIAL FLUTTER ABLATION N/A 09/21/2011   Procedure: ATRIAL FLUTTER ABLATION;  Surgeon: JThompson Grayer MD;  [facility];  Service: Cardiovascular;  Laterality: N/A;  . [place]ARDI[place]Y [place]BL[place]  2013   for atrial flutter  . CARDIOVASCULAR STRESS TEST  03/2012   ETT WNL (Nehemiah Massed  . [place]  2014   EF 50%, nl LV fxn, RV nl size/function, mild mitral insuff   Family History  Problem Relation Age of Onset  . Heart failure Father [phone] . Cancer Mother 673      NHL  . Hypertension Maternal Grandfather   . CAD Maternal Grandfather 523      MI  . Diabetes Other        grandparents  . Cancer Brother [phone]      lung (smoker)   History  Sexual Activity  . Sexual activity: Yes    Outpatient Encounter Prescriptions as of 10/04/2016  Medication Sig  . ACCU-CHEK FASTCLIX LANCETS MISC Check blood sugar once daily and as instructed. Dx 250.00  . aspirin EC 81 MG tablet Take 81 mg by mouth daily.  . Blood Glucose Monitoring Suppl (BLOOD GLUCOSE MONITOR SYSTEM) W/DEVICE KIT by Does not apply route. Use to check sugar once daily and as needed Dx: E11.9 **ONE TOUCH VERIO**  . carvedilol (COREG) 6.25 MG tablet TAKE 1 TABLET BY MOUTH 2 TIMES DAILY WITH A MEAL.  . fluticasone (FLONASE) 50 MCG/ACT nasal spray Place 2  sprays into both nostrils daily.  . furosemide (LASIX) 20 MG tablet TAKE 1 TABLET (20 MG TOTAL) BY MOUTH 2 (TWO) TIMES DAILY AS NEEDED.  Marland Kitchen glimepiride (AMARYL) 2 MG tablet Take 1 tablet (2 mg total) by mouth daily with breakfast.  . hydrOXYzine (ATARAX/VISTARIL) 25 MG tablet TAKE 1 TABLET (25 MG TOTAL) BY MOUTH 2 (TWO) TIMES DAILY AS NEEDED FOR ANXIETY.  . metFORMIN (GLUCOPHAGE) 500 MG tablet Take 1 tablet (500 mg total) by mouth 2 (two) times daily with a meal. (Patient taking differently: Take 1,000 mg by mouth 2 (two) times daily with a meal. )  . montelukast (SINGULAIR) 10 MG tablet Take 1 tablet (10 mg total) by mouth at bedtime.  . potassium chloride (KLOR-CON 10) 10 MEQ tablet TAKE 1 TABLET (10 MEQ TOTAL) BY MOUTH DAILY.  . Probiotic Product (PROBIOTIC DAILY PO) Take by mouth.  . simvastatin (ZOCOR) 20 MG tablet TAKE 1 TABLET (20 MG TOTAL) BY MOUTH  EVERY EVENING.  . [DISCONTINUED] glucose blood (ONETOUCH VERIO) test strip Use to check sugar once daily and as needed. Dx: E11.9  . [DISCONTINUED] Omega-3 Fatty Acids (FISH OIL) 1000 MG CAPS Take 2 capsules (2,000 mg total) by mouth daily.   No facility-administered encounter medications on file as of 10/04/2016.     Activities of Daily Living In your present state of health, do you have any difficulty performing the following activities: 10/04/2016  Hearing? N  Vision? Y  Difficulty concentrating or making decisions? N  Walking or climbing stairs? Y  Dressing or bathing? N  Doing errands, shopping? N  Preparing Food and eating ? N  Using the Toilet? N  In the past six months, have you accidently leaked urine? N  Do you have problems with loss of bowel control? N  Managing your Medications? N  Managing your Finances? N  Housekeeping or managing your Housekeeping? N  Some recent data might be hidden    Patient Care Team: [name], MD as PCP - General (Family Medicine) Thompson Grayer, MD as Attending Physician (Cardiology) Corey Skains, MD as Referring Physician (Internal Medicine)    Assessment:     Hearing Screening   '125Hz'  '250Hz'  '500Hz'  '1000Hz'  '2000Hz'  '3000Hz'  '4000Hz'  '6000Hz'  '8000Hz'   Right ear:   40 40 40  0    Left ear:   40 40 40  0    Vision Screening Comments: Last vision exam in June 2017 [facility]   Exercise Activities and Dietary recommendations Current Exercise Habits: The patient does not participate in regular exercise at present, Exercise limited by: None identified  Goals    . weight management (pt-stated)          Starting 10/04/2016, I will continue to increase intake of fish and vegetables as well as decrease intake of sugar and bread in an effort to lose weight. Target weight is 250 lbs.       Fall Risk Fall Risk  10/04/2016 11/06/2015 10/16/2014 09/03/2013  Falls in the past year? Yes Yes Yes Yes  Number falls in past yr: 2 or more 2  or more 2 or more 1  Injury with Fall? No No No No  Risk for fall due to : - - - Impaired balance/gait   Depression Screen PHQ 2/9 Scores 10/04/2016 11/06/2015 10/16/2014 09/03/2013  PHQ - 2 Score 0 0 1 0     Cognitive Function MMSE - Mini Mental State Exam 10/04/2016  Orientation to time 5  Orientation to Place  5  Registration 3  Attention/ Calculation 0  Recall 3  Language- name 2 objects 0  Language- repeat 1  Language- follow 3 step command 3  Language- read & follow direction 0  Write a sentence 0  Copy design 0  Total score 20       PLEASE NOTE: A Mini-Cog screen was completed. Maximum score is 20. A value of 0 denotes this part of Folstein MMSE was not completed or the patient failed this part of the Mini-Cog screening.   Mini-Cog Screening Orientation to Time - Max 5 pts Orientation to Place - Max 5 pts Registration - Max 3 pts Recall - Max 3 pts Language Repeat - Max 1 pts Language Follow 3 Step Command - Max 3 pts   Immunization History  Administered Date(s) Administered  . Influenza,inj,Quad PF,36+ Mos 05/29/2014, 02/21/2015  . Influenza-Unspecified 01/24/2013  . Pneumococcal Polysaccharide-23 02/24/2013  . Td 04/26/2006   Screening Tests Health Maintenance  Topic Date Due  . DTaP/Tdap/Td (1 - Tdap) 04/25/2026 (Originally 04/27/2006)  . TETANUS/TDAP  04/25/2026 (Originally 04/26/2016)  . COLONOSCOPY  10/05/2026 (Originally 09/13/2005)  . OPHTHALMOLOGY EXAM  10/08/2016  . HEMOGLOBIN A1C  11/03/2016  . INFLUENZA VACCINE  11/24/2016  . FOOT EXAM  06/04/2017  . URINE MICROALBUMIN  10/04/2017  . PNEUMOCOCCAL POLYSACCHARIDE VACCINE (2) 02/24/2018  . Hepatitis C Screening  Completed  . HIV Screening  Completed      Plan:     I have personally reviewed and addressed the Medicare Annual Wellness questionnaire and have noted the following in the patient's chart:  A. Medical and social history B. Use of alcohol, tobacco or illicit drugs  C. Current medications  and supplements D. Functional ability and status E.  Nutritional status F.  Physical activity G. Advance directives H. List of other physicians I.  Hospitalizations, surgeries, and ER visits in previous 12 months J.  [place] to include hearing, vision, cognitive, depression L. Referrals and appointments - none  In addition, I have reviewed and discussed with patient certain preventive protocols, quality metrics, and best practice recommendations. A written personalized care plan for preventive services as well as general preventive health recommendations were provided to patient.  See attached scanned questionnaire for additional information.   Signed,   Lindell Noe, MHA, BS, LPN Health Coach

## 2016-10-05 LAB — HIV ANTIBODY (ROUTINE TESTING W REFLEX): HIV 1&2 Ab, 4th Generation: NONREACTIVE

## 2016-10-05 LAB — HEPATITIS C ANTIBODY: HCV Ab: NEGATIVE

## 2016-10-12 ENCOUNTER — Institutional Professional Consult (permissible substitution): Payer: Self-pay | Admitting: Internal Medicine

## 2016-10-13 ENCOUNTER — Telehealth: Payer: Self-pay

## 2016-10-13 MED ORDER — METFORMIN HCL 500 MG PO TABS: ORAL_TABLET | ORAL | 0 refills | Status: DC

## 2016-10-13 NOTE — Telephone Encounter (Signed)
Pt changed metformin 500 mg to two tabs in AM and one tab in PM. Pt is out of med and [facility] not [facility] because pt is early for refill; med list instructions have metformin 500 mg bid. Pt is out of med and request new rx with new instructions to [facility]. Pt has CPX scheduled with Dr Darnell Level on 10/14/16.Please advise.(instructions on med list not changed until Dr Darnell Level approval).

## 2016-10-13 NOTE — Telephone Encounter (Signed)
Higher dose sent to pharmacy. Will discuss tomorrow.

## 2016-10-14 ENCOUNTER — Encounter: Payer: Self-pay | Admitting: Family Medicine

## 2016-10-14 ENCOUNTER — Ambulatory Visit (INDEPENDENT_AMBULATORY_CARE_PROVIDER_SITE_OTHER): Payer: Medicare Other | Admitting: Family Medicine

## 2016-10-14 VITALS — BP 120/82 | HR 73 | Temp 98.4°F | Ht 70.0 in | Wt 297.5 lb

## 2016-10-14 DIAGNOSIS — R0602 Shortness of breath: Secondary | ICD-10-CM

## 2016-10-14 DIAGNOSIS — E785 Hyperlipidemia, unspecified: Secondary | ICD-10-CM

## 2016-10-14 DIAGNOSIS — Z0000 Encounter for general adult medical examination without abnormal findings: Secondary | ICD-10-CM

## 2016-10-14 DIAGNOSIS — E118 Type 2 diabetes mellitus with unspecified complications: Secondary | ICD-10-CM

## 2016-10-14 DIAGNOSIS — Z7189 Other specified counseling: Secondary | ICD-10-CM

## 2016-10-14 DIAGNOSIS — E1165 Type 2 diabetes mellitus with hyperglycemia: Secondary | ICD-10-CM

## 2016-10-14 DIAGNOSIS — I10 Essential (primary) hypertension: Secondary | ICD-10-CM

## 2016-10-14 DIAGNOSIS — D3502 Benign neoplasm of left adrenal gland: Secondary | ICD-10-CM

## 2016-10-14 DIAGNOSIS — IMO0002 Reserved for concepts with insufficient information to code with codable children: Secondary | ICD-10-CM

## 2016-10-14 DIAGNOSIS — G4733 Obstructive sleep apnea (adult) (pediatric): Secondary | ICD-10-CM

## 2016-10-14 DIAGNOSIS — Z6841 Body Mass Index (BMI) 40.0 and over, adult: Secondary | ICD-10-CM

## 2016-10-14 DIAGNOSIS — K429 Umbilical hernia without obstruction or gangrene: Secondary | ICD-10-CM

## 2016-10-14 DIAGNOSIS — R140 Abdominal distension (gaseous): Secondary | ICD-10-CM

## 2016-10-14 NOTE — Addendum Note (Signed)
Addended by: Ellamae Sia on: 10/14/2016 11:43 AM   Modules accepted: Orders

## 2016-10-14 NOTE — Assessment & Plan Note (Signed)
S/p eval by gen surgery - told optional and not likely to get incarcerated.

## 2016-10-14 NOTE — Assessment & Plan Note (Signed)
Chronic, stable. Continue current regimen. 

## 2016-10-14 NOTE — Assessment & Plan Note (Signed)
Preventative protocols reviewed and updated unless pt declined. Discussed healthy diet and lifestyle.  

## 2016-10-14 NOTE — Progress Notes (Signed)
BP 120/82   Pulse 73   Temp 98.4 F (36.9 C)   Ht '5\' 10"'$  (1.778 m)   Wt 297 lb 8 oz (134.9 kg)   SpO2 96%   BMI 42.69 kg/m    CC: CPE Subjective:    Patient ID: [name], male    DOB: 1955-08-16, 61 y.o.   MRN: [id number]  HPI: [name] is a 61 y.o. male presenting on 10/14/2016 for Annual Exam (Part 2)   Saw Katha Cabal last week for medicare wellness visit. Note reviewed.   Saw cardiology Dr Nehemiah Massed last month for ongoing dyspnea - recommended eval with echo, stress test - these are still pending - and rec continue CPAP for OSA.   Singulair has helped with dyspnea.  L knee pain without inciting trauma/injury.   DM - regularly does check sugars 140-200. He self increased metformin to 500/'1000mg'$  upon return of elevated A1c. Also takes glimepiride '2mg'$  daily with breakfast.  Lab Results  Component Value Date   HGBA1C 8.5 (H) 10/04/2016    Preventative: Colon cancer screening - doesn't want to do due to umbilical hernia.Never returned iFOB. Prostate cancer screening - discussed, declines for now.  Flu shot - 05/2014 Tetanus thinks 2008 Pneumovax per patient done in hospital 02/2013 Advanced directives - has discussed with family.No children.Unsure who would be HCPOA. (?Dawn GF vs sister (who lives in [place])).Does not want prolonged life support. Packet provided previously.  Seat belt use discussed Sunscreen use discussed. No changing moles on skin.  Ex smoker 2014. 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 Interim medical history since our last visit reviewed. Allergies and medications  reviewed and updated. Outpatient Medications Prior to Visit  Medication Sig Dispense Refill  . ACCU-CHEK FASTCLIX LANCETS MISC Check blood sugar once daily and as instructed. Dx 250.00 100 each 3  . aspirin EC 81 MG tablet Take 81 mg by mouth daily.    . Blood Glucose Monitoring Suppl (BLOOD GLUCOSE MONITOR SYSTEM) W/DEVICE KIT by Does not apply route. 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 carvedilol (COREG) 6.25 MG tablet TAKE 1 TABLET BY MOUTH 2 TIMES DAILY WITH A MEAL. 180 tablet 1  . fluticasone (FLONASE) 50 MCG/ACT nasal spray Place 2 sprays into both nostrils daily. 16 g 3  . furosemide (LASIX) 20 MG tablet TAKE 1 TABLET (20 MG TOTAL) BY MOUTH 2 (TWO) TIMES DAILY AS NEEDED. 90 tablet 1  . glimepiride (AMARYL) 2 MG tablet Take 1 tablet (2 mg total) by mouth daily with breakfast. 90 tablet 0  . hydrOXYzine (ATARAX/VISTARIL) 25 MG tablet TAKE 1 TABLET (25 MG TOTAL) BY MOUTH 2 (TWO) TIMES DAILY AS NEEDED FOR ANXIETY. 60 tablet 1  . metFORMIN (GLUCOPHAGE) 500 MG tablet Take '500mg'$  in am and '1000mg'$  in pm 270 tablet 0  . montelukast (SINGULAIR) 10 MG tablet Take 1 tablet (10 mg total) by mouth at bedtime. 30 tablet 3  . ONETOUCH VERIO test strip USE TO CHECK SUGAR ONCE DAILY AND AS NEEDED. DX: E11.9 100 each 1  . potassium chloride (KLOR-CON 10) 10 MEQ tablet TAKE  1 TABLET (10 MEQ TOTAL) BY MOUTH DAILY. 90 tablet 0  . Probiotic Product (PROBIOTIC DAILY PO) Take by mouth.    . simvastatin (ZOCOR) 20 MG tablet TAKE 1 TABLET (20 MG TOTAL) BY MOUTH EVERY EVENING. 90 tablet 1   No facility-administered medications prior to visit.      Per HPI unless specifically indicated in ROS section below Review of Systems  Constitutional: Negative for activity change, appetite change, chills, fatigue, fever and unexpected weight change.  HENT: Negative for hearing loss.   Eyes: Negative for visual disturbance.  Respiratory: Positive for cough (mild), shortness of  breath (chronic) and wheezing. Negative for chest tightness.   Cardiovascular: Negative for chest pain, palpitations and leg swelling.  Gastrointestinal: Positive for abdominal distention (chronic gassiness/bloating), constipation and nausea. Negative for abdominal pain, blood in stool, diarrhea and vomiting.       No greasy or yellow stools Takes vegetable laxative  Genitourinary: Negative for difficulty urinating and hematuria.  Musculoskeletal: Negative for arthralgias, myalgias and neck pain.  Skin: Negative for rash.  Neurological: Negative for dizziness, seizures, syncope and headaches.  Hematological: Negative for adenopathy. Does not bruise/bleed easily.  Psychiatric/Behavioral: Negative for dysphoric mood. The patient is nervous/anxious (dyspnea related).        Objective:    BP 120/82   Pulse 73   Temp 98.4 F (36.9 C)   Ht 5\' 10"  (1.778 m)   Wt 297 lb 8 oz (134.9 kg)   SpO2 96%   BMI 42.69 kg/m   Wt Readings from Last 3 Encounters:  10/14/16 297 lb 8 oz (134.9 kg)  10/04/16 291 lb 4 oz (132.1 kg)  09/10/16 293 lb (132.9 kg)    Physical Exam  Constitutional: He is oriented to person, place, and time. He appears well-developed and well-nourished. No distress.  HENT:  Head: Normocephalic and atraumatic.  Right Ear: Hearing, tympanic membrane, external ear and ear canal normal.  Left Ear: Hearing, tympanic membrane, external ear and ear canal normal.  Nose: Nose normal.  Mouth/Throat: Uvula is midline, oropharynx is clear and moist and mucous membranes are normal. No oropharyngeal exudate, posterior oropharyngeal edema or posterior oropharyngeal erythema.  Eyes: Conjunctivae and EOM are normal. Pupils are equal, round, and reactive to light. No scleral icterus.  Neck: Normal range of motion. Neck supple. Carotid bruit is not present. No thyromegaly present.  Cardiovascular: Normal rate, regular rhythm, normal heart sounds and intact distal pulses.   No murmur  heard. Pulses:      Radial pulses are 2+ on the right side, and 2+ on the left side.  Pulmonary/Chest: Effort normal and breath sounds normal. No respiratory distress. He has no wheezes. He has no rales.  Abdominal: Soft. Normal appearance and bowel sounds are normal. He exhibits no distension and no mass. There is no tenderness. There is no rebound and no guarding. A hernia (large umbilical hernia - chronic, s/p eval by gen surgery) is present.  Musculoskeletal: Normal range of motion. He exhibits no edema.  Lymphadenopathy:    He has no cervical adenopathy.  Neurological: He is alert and oriented to person, place, and time.  CN grossly intact, station and gait intact  Skin: Skin is warm and dry. No rash noted.  Psychiatric: He has a normal mood and affect. His behavior is normal. Judgment and thought content normal.  Nursing note and vitals reviewed.  Results for orders placed or performed in visit on 10/04/16  Comprehensive metabolic panel  Result Value Ref Range  Sodium 138 135 - 145 mEq/L   Potassium 4.2 3.5 - 5.1 mEq/L   Chloride 100 96 - 112 mEq/L   CO2 31 19 - 32 mEq/L   Glucose, Bld 185 (H) 70 - 99 mg/dL   BUN 4 (L) 6 - 23 mg/dL   Creatinine, Ser 0.58 0.40 - 1.50 mg/dL   Total Bilirubin 0.3 0.2 - 1.2 mg/dL   Alkaline Phosphatase 60 39 - 117 U/L   AST 30 0 - 37 U/L   ALT 25 0 - 53 U/L   Total Protein 6.7 6.0 - 8.3 g/dL   Albumin 4.1 3.5 - 5.2 g/dL   Calcium 9.5 8.4 - 10.5 mg/dL   GFR 151.36 >60.00 mL/min  Hemoglobin A1c  Result Value Ref Range   Hgb A1c MFr Bld 8.5 (H) 4.6 - 6.5 %  Hepatitis C antibody  Result Value Ref Range   HCV Ab NEGATIVE NEGATIVE  HIV antibody  Result Value Ref Range   HIV 1&2 Ab, 4th Generation NONREACTIVE NONREACTIVE  Lipid panel  Result Value Ref Range   Cholesterol 131 0 - 200 mg/dL   Triglycerides 210.0 (H) 0.0 - 149.0 mg/dL   HDL 33.00 (L) >39.00 mg/dL   VLDL 42.0 (H) 0.0 - 40.0 mg/dL   Total CHOL/HDL Ratio 4    NonHDL 98.21    Microalbumin / creatinine urine ratio  Result Value Ref Range   Microalb, Ur 0.9 0.0 - 1.9 mg/dL   Creatinine,U 111.0 mg/dL   Microalb Creat Ratio 0.8 0.0 - 30.0 mg/g  PSA  Result Value Ref Range   PSA 0.85 0.10 - 4.00 ng/mL  LDL cholesterol, direct  Result Value Ref Range   Direct LDL 74.0 mg/dL      Assessment & Plan:   Problem List Items Addressed This Visit    Adrenal adenoma, left    Pending 24hr urine hormonal evaluation.       Advanced care planning/counseling discussion    Advanced directives - has discussed with family.No children.Unsure who would be HCPOA. (?Dawn GF vs sister (who lives in [place])).Does not want prolonged life support. Packet provided previously.       BMI 40.0-44.9, adult (HCC)    Discussed healthy diet and lifestyle changes to affect sustainable weight loss       Diabetes mellitus type 2 with complications, uncontrolled ([place])    Endorses better control. Reviewed glycemic goals. He recently increased metformin to '1500mg'$  total daily, continues glimepiride.       Dyspnea    Appreciate cards eval - pending stress test and echo      Gassiness    Ongoing. Denies sxs of pancreatic insufficiency.       Health maintenance examination - Primary    Preventative protocols reviewed and updated unless pt declined. Discussed healthy diet and lifestyle.       Hyperlipidemia    Chronic. LDL at goal. Trig elevated - likely from hyperglycemia. Continue statin.       Hypertension    Chronic, stable. Continue current regimen.       Obstructive sleep apnea    Still pending pulm referral from last month - states he postponed appt to next month.      Umbilical hernia without obstruction and without gangrene    S/p eval by gen surgery - told optional and not likely to get incarcerated.          Follow up plan: Return in about 4 months (around 02/13/2017) for follow up visit.  [name], MD

## 2016-10-14 NOTE — Assessment & Plan Note (Signed)
Advanced directives - has discussed with family.No children.Unsure who would be HCPOA. (?Dawn GF vs sister (who lives in [place])).Does not want prolonged life support. Packet provided previously.

## 2016-10-14 NOTE — Assessment & Plan Note (Signed)
Discussed healthy diet and lifestyle changes to affect sustainable weight loss  

## 2016-10-14 NOTE — Assessment & Plan Note (Addendum)
Chronic. LDL at goal. Trig elevated - likely from hyperglycemia. Continue statin.

## 2016-10-14 NOTE — Assessment & Plan Note (Signed)
Endorses better control. Reviewed glycemic goals. He recently increased metformin to 1500mg  total daily, continues glimepiride.

## 2016-10-14 NOTE — Assessment & Plan Note (Signed)
Appreciate cards eval - pending stress test and echo

## 2016-10-14 NOTE — Assessment & Plan Note (Signed)
Ongoing. Denies sxs of pancreatic insufficiency.

## 2016-10-14 NOTE — Assessment & Plan Note (Signed)
Pending 24hr urine hormonal evaluation.

## 2016-10-14 NOTE — Assessment & Plan Note (Signed)
Still pending pulm referral from last month - states he postponed appt to next month.

## 2016-10-14 NOTE — Patient Instructions (Addendum)
Return stool kit. Good to see you today, call us with questions Continue current medicines including higher metformin dose. Return in 4 months for diabetes follow up visit.  Let me know sooner if sugars are staying too high. Goal fasting 80-120, goal 2 hours after a meal is <180. If staying >200 let me know.

## 2016-10-18 LAB — METANEPHRINES, URINE, 24 HOUR
METANEPH TOTAL UR: 591 ug/(24.h) (ref 224–832)
METANEPHRINES UR: 172 ug/(24.h) (ref 90–315)
NORMETANEPHRINE 24H UR: 419 ug/(24.h) (ref 122–676)

## 2016-10-20 LAB — CORTISOL, URINE, 24 HOUR
CORTISOL (UR), FREE: 23.3 ug/(24.h) (ref 4.0–50.0)
RESULTS RECEIVED: 1.51 g/(24.h) (ref 0.63–2.50)

## 2016-10-26 ENCOUNTER — Institutional Professional Consult (permissible substitution): Payer: Self-pay | Admitting: Internal Medicine

## 2016-10-28 LAB — CATECHOLAMINES, FRAC W/VMA, 24H UR
Calc Total (E+NE): 118 mcg/24 h (ref 26–121)
Creatinine, 24H, Ur: 1.51 g/(24.h) (ref 0.63–2.50)
Dopamine: 467 mcg/24 h (ref 52–480)
Norepinephrine, 24H, Ur: 118 mcg/24 h — ABNORMAL HIGH (ref 15–100)
TOTAL VOLUME: 1600 mL
VANILLYLMANDELIC ACID: 2.9 mg/(24.h)

## 2016-11-01 ENCOUNTER — Other Ambulatory Visit: Payer: Self-pay | Admitting: Family Medicine

## 2016-11-03 ENCOUNTER — Encounter: Payer: Self-pay | Admitting: *Deleted

## 2016-11-15 ENCOUNTER — Telehealth: Payer: Self-pay | Admitting: *Deleted

## 2016-11-15 MED ORDER — GLUCOSE BLOOD VI STRP: ORAL_STRIP | 11 refills | Status: DC

## 2016-11-15 NOTE — Telephone Encounter (Signed)
Sent in. E11.8 Lab Results  Component Value Date   HGBA1C 8.5 (H) 10/04/2016

## 2016-11-15 NOTE — Telephone Encounter (Signed)
Patient left a voicemail stating that he needs a new script sent to the pharmacy for his test strips with new directions. Patient stated that he tried to get a refill and was told by the pharmacist that he can not get refilled for a month. Patient stated that he is testing his blood sugar at least twice a day now.

## 2016-11-17 ENCOUNTER — Telehealth: Payer: Self-pay | Admitting: Family Medicine

## 2016-11-17 DIAGNOSIS — D3502 Benign neoplasm of left adrenal gland: Secondary | ICD-10-CM

## 2016-11-17 NOTE — Telephone Encounter (Signed)
Pt dropped off form to be filled out to be excused from [employer]. Placed in rx tower.

## 2016-11-21 DIAGNOSIS — Z7689 Persons encountering health services in other specified circumstances: Secondary | ICD-10-CM

## 2016-11-21 NOTE — Telephone Encounter (Signed)
Letter written and in my outbox.

## 2016-11-22 NOTE — Telephone Encounter (Signed)
I left a message on patient's voice mail to let him know form and letter are ready for pick up and of $5 charge.

## 2016-11-26 ENCOUNTER — Institutional Professional Consult (permissible substitution): Payer: Self-pay | Admitting: Internal Medicine

## 2017-01-01 ENCOUNTER — Other Ambulatory Visit: Payer: Self-pay | Admitting: Family Medicine

## 2017-01-03 ENCOUNTER — Other Ambulatory Visit: Payer: Self-pay | Admitting: Family Medicine

## 2017-01-08 ENCOUNTER — Other Ambulatory Visit: Payer: Self-pay | Admitting: Family Medicine

## 2017-01-28 ENCOUNTER — Other Ambulatory Visit: Payer: Self-pay | Admitting: Family Medicine

## 2017-02-07 ENCOUNTER — Other Ambulatory Visit: Payer: Self-pay | Admitting: Family Medicine

## 2017-02-19 ENCOUNTER — Other Ambulatory Visit: Payer: Self-pay | Admitting: Family Medicine

## 2017-02-22 ENCOUNTER — Ambulatory Visit (INDEPENDENT_AMBULATORY_CARE_PROVIDER_SITE_OTHER): Payer: Medicare Other

## 2017-02-22 DIAGNOSIS — Z23 Encounter for immunization: Secondary | ICD-10-CM

## 2017-05-10 LAB — HM DIABETES EYE EXAM

## 2017-05-23 ENCOUNTER — Other Ambulatory Visit: Payer: Self-pay | Admitting: Family Medicine

## 2017-05-26 ENCOUNTER — Encounter: Payer: Self-pay | Admitting: Family Medicine

## 2017-05-31 DIAGNOSIS — I420 Dilated cardiomyopathy: Secondary | ICD-10-CM | POA: Diagnosis not present

## 2017-05-31 DIAGNOSIS — I4891 Unspecified atrial fibrillation: Secondary | ICD-10-CM | POA: Diagnosis not present

## 2017-05-31 DIAGNOSIS — I2510 Atherosclerotic heart disease of native coronary artery without angina pectoris: Secondary | ICD-10-CM | POA: Diagnosis not present

## 2017-05-31 DIAGNOSIS — I714 Abdominal aortic aneurysm, without rupture: Secondary | ICD-10-CM | POA: Diagnosis not present

## 2017-05-31 DIAGNOSIS — R0602 Shortness of breath: Secondary | ICD-10-CM | POA: Diagnosis not present

## 2017-06-13 ENCOUNTER — Other Ambulatory Visit: Payer: Self-pay | Admitting: Family Medicine

## 2017-06-13 DIAGNOSIS — G4731 Primary central sleep apnea: Secondary | ICD-10-CM | POA: Diagnosis not present

## 2017-06-13 DIAGNOSIS — I714 Abdominal aortic aneurysm, without rupture: Secondary | ICD-10-CM | POA: Diagnosis not present

## 2017-06-13 DIAGNOSIS — R0602 Shortness of breath: Secondary | ICD-10-CM | POA: Diagnosis not present

## 2017-06-13 DIAGNOSIS — I25118 Atherosclerotic heart disease of native coronary artery with other forms of angina pectoris: Secondary | ICD-10-CM | POA: Diagnosis not present

## 2017-06-20 ENCOUNTER — Encounter: Payer: Self-pay | Admitting: Family Medicine

## 2017-06-20 ENCOUNTER — Ambulatory Visit (INDEPENDENT_AMBULATORY_CARE_PROVIDER_SITE_OTHER): Payer: Medicare Other | Admitting: Family Medicine

## 2017-06-20 VITALS — BP 130/82 | HR 91 | Temp 98.0°F | Wt 285.2 lb

## 2017-06-20 DIAGNOSIS — R1909 Other intra-abdominal and pelvic swelling, mass and lump: Secondary | ICD-10-CM

## 2017-06-20 DIAGNOSIS — E118 Type 2 diabetes mellitus with unspecified complications: Secondary | ICD-10-CM | POA: Diagnosis not present

## 2017-06-20 DIAGNOSIS — E1165 Type 2 diabetes mellitus with hyperglycemia: Secondary | ICD-10-CM

## 2017-06-20 DIAGNOSIS — Z87891 Personal history of nicotine dependence: Secondary | ICD-10-CM

## 2017-06-20 DIAGNOSIS — R0609 Other forms of dyspnea: Secondary | ICD-10-CM | POA: Diagnosis not present

## 2017-06-20 DIAGNOSIS — R1900 Intra-abdominal and pelvic swelling, mass and lump, unspecified site: Secondary | ICD-10-CM | POA: Diagnosis not present

## 2017-06-20 DIAGNOSIS — K429 Umbilical hernia without obstruction or gangrene: Secondary | ICD-10-CM | POA: Diagnosis not present

## 2017-06-20 DIAGNOSIS — R140 Abdominal distension (gaseous): Secondary | ICD-10-CM | POA: Diagnosis not present

## 2017-06-20 DIAGNOSIS — F410 Panic disorder [episodic paroxysmal anxiety] without agoraphobia: Secondary | ICD-10-CM

## 2017-06-20 DIAGNOSIS — G4733 Obstructive sleep apnea (adult) (pediatric): Secondary | ICD-10-CM

## 2017-06-20 DIAGNOSIS — IMO0002 Reserved for concepts with insufficient information to code with codable children: Secondary | ICD-10-CM

## 2017-06-20 LAB — HEMOGLOBIN A1C: Hgb A1c MFr Bld: 7.6 % — ABNORMAL HIGH (ref 4.6–6.5)

## 2017-06-20 MED ORDER — PANTOPRAZOLE SODIUM 40 MG PO TBEC
40.0000 mg | DELAYED_RELEASE_TABLET | Freq: Every day | ORAL | 3 refills | Status: DC
Start: 2017-06-20 — End: 2017-10-17

## 2017-06-20 MED ORDER — CITALOPRAM HYDROBROMIDE 10 MG PO TABS: 10.0000 mg | ORAL_TABLET | Freq: Every day | ORAL | 3 refills | Status: DC

## 2017-06-20 NOTE — Assessment & Plan Note (Signed)
Congratulated on smoking cessation.  

## 2017-06-20 NOTE — Assessment & Plan Note (Signed)
Discussed this - rec start celexa 10mg  daily. Continue hydroxyzine PRN. Reassess at f/u visit.

## 2017-06-20 NOTE — Assessment & Plan Note (Signed)
Trial protonix 40mg  daily.

## 2017-06-20 NOTE — Assessment & Plan Note (Addendum)
Discussed this. CT scan showed large sebaceous cyst (07/2016).

## 2017-06-20 NOTE — Assessment & Plan Note (Signed)
Chronic. Has had reassuring cardiac evaluation by [facility]. Spirometry in chart 08/2016 consistnet with mod severe restriction. Has not had formal pulmonary evaluation - will refer.

## 2017-06-20 NOTE — Assessment & Plan Note (Signed)
Known OSA. Previously had not tolerated CPAP machine. Now ready to readdress - will refer to pulm.

## 2017-06-20 NOTE — Assessment & Plan Note (Signed)
Discussed this. Had eval by gen surgery - told optional surgery.

## 2017-06-20 NOTE — Patient Instructions (Addendum)
See Rosaria Ferries to schedule lung doctor evaluation for sleep apnea/CPAP and shortness of breath.  Start protonix 40mg  daily for next 3 weeks. Start celexa 10mg  daily for the next month.  Return in next few months for diabetes follow up visit.  A1c today (labs).

## 2017-06-20 NOTE — Progress Notes (Signed)
BP 130/82 (BP Location: Left Arm, Patient Position: Sitting, Cuff Size: Large)   Pulse 91   Temp 98 F (36.7 C) (Oral)   Wt 285 lb 4 oz (129.4 kg)   SpO2 93%   BMI 40.93 kg/m    CC: discuss CPAP Subjective:    Patient ID: [name], male    DOB: 12/01/1955, 62 y.o.   MRN: [id number]  HPI: [name] is a 62 y.o. male presenting on 06/20/2017 for Discuss CPAP (Was seen by cardio due to low energy and not SOB with exertion. Told by cardio, heart is fine. Suggested issues may be from OSA. Pt states he has CPAP but has not has not used it in yrs. )   Saw cards Dr Nehemiah Massed this month, note reviewed. Recent reassuring stress echo (05/2017) for worsening exertional dyspnea and fatigue. rec restarting CPAP use for known OSA, needs updated sleep study. Carvedilol was held for the past week with no improvement in symptoms. He has always had trouble tolerating CPAP machine.   Rough weekend - due to worsening dyspnea and anxiety. Hydroxyzine helped him breath better. He did try buspar previously.   Denies fevers/chills, cough.  Ongoing orthopnea.  Fully quit smoking 04/2017! Has had remote pulm function testing.   Overdue for DM f/u visit.  GERD - nexium was too expensive and he didn't notice significant improvement.   Relevant past medical, surgical, family and social history reviewed and updated as indicated. Interim medical history since our last visit reviewed. Allergies and medications reviewed and updated. Outpatient Medications Prior to Visit  Medication Sig Dispense Refill  . ACCU-CHEK FASTCLIX LANCETS MISC Check blood sugar once daily and as instructed. Dx 250.00 100 each 3  . aspirin EC 81 MG tablet Take 81 mg by mouth daily.    . Blood Glucose Monitoring Suppl (BLOOD GLUCOSE MONITOR SYSTEM) W/DEVICE KIT by Does not apply route. 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 carvedilol (COREG) 6.25 MG tablet TAKE 1 TABLET BY MOUTH 2  TIMES DAILY WITH A MEAL. 180 tablet 1  . fluticasone (FLONASE) 50 MCG/ACT nasal spray Place 2 sprays into both nostrils daily. 16 g 3  . furosemide (LASIX) 20 MG tablet TAKE 1 TABLET (20 MG TOTAL) BY MOUTH 2 (TWO) TIMES DAILY AS NEEDED. 90 tablet 3  . glimepiride (AMARYL) 2 MG tablet TAKE 1 TABLET (2 MG TOTAL) BY MOUTH DAILY WITH BREAKFAST. 90 tablet 2  . glucose blood (ONETOUCH VERIO) test strip Use as instructed to check three times daily and as needed. E11.8 100 each 11  . hydrOXYzine (ATARAX/VISTARIL) 25 MG tablet TAKE 1 TABLET (25 MG TOTAL) BY MOUTH 2 (TWO) TIMES DAILY AS NEEDED FOR ANXIETY. 60 tablet 5  . metFORMIN (GLUCOPHAGE) 500 MG tablet TAKE 1 TABLET BY MOUTH EVERY MORNING AND TAKE 2 TABLETS BY MOUTH EVERY EVENING 270 tablet 1  . montelukast (SINGULAIR) 10 MG tablet TAKE 1 TABLET BY MOUTH EVERYDAY AT BEDTIME 30 tablet 11  . potassium chloride (KLOR-CON 10) 10 MEQ tablet TAKE 1 TABLET (10 MEQ TOTAL) BY MOUTH DAILY. 90 tablet 0  . Probiotic Product (PROBIOTIC DAILY PO) Take by mouth.    . simvastatin (ZOCOR) 20 MG tablet TAKE 1 TABLET (20 MG TOTAL) BY MOUTH EVERY EVENING. 90 tablet 2   No facility-administered medications prior to visit.      Per HPI unless specifically indicated in ROS section below Review of Systems  Objective:    BP 130/82 (BP Location: Left Arm, Patient Position: Sitting, Cuff Size: Large)   Pulse 91   Temp 98 F (36.7 C) (Oral)   Wt 285 lb 4 oz (129.4 kg)   SpO2 93%   BMI 40.93 kg/m   Wt Readings from Last 3 Encounters:  06/20/17 285 lb 4 oz (129.4 kg)  10/14/16 297 lb 8 oz (134.9 kg)  10/04/16 291 lb 4 oz (132.1 kg)    Physical Exam  Constitutional: He appears well-developed and well-nourished. No distress.  HENT:  Head: Normocephalic and atraumatic.  Mouth/Throat: Oropharynx is clear and moist. No oropharyngeal exudate.  Cardiovascular: Normal rate, regular rhythm, normal heart sounds and intact distal pulses.  No murmur  heard. Pulmonary/Chest: Effort normal and breath sounds normal. No respiratory distress. He has no wheezes. He has no rales.  Musculoskeletal: He exhibits no edema.  Skin: Skin is warm and dry. No rash noted.  Large circumscribed mass L lower back with central fluctuance  Psychiatric: He has a normal mood and affect.  Nursing note and vitals reviewed.  Results for orders placed or performed in visit on 05/26/17  HM DIABETES EYE EXAM  Result Value Ref Range   HM Diabetic Eye Exam No Retinopathy No Retinopathy   Lab Results  Component Value Date   HGBA1C 8.5 (H) 10/04/2016    Lab Results  Component Value Date   CREATININE 0.58 10/04/2016   BUN 4 (L) 10/04/2016   NA 138 10/04/2016   K 4.2 10/04/2016   CL 100 10/04/2016   CO2 31 10/04/2016    Lab Results  Component Value Date   TSH 2.37 06/22/2016       Assessment & Plan:   Problem List Items Addressed This Visit    Diabetes mellitus type 2 with complications, uncontrolled ([place]) - Primary    Chronic, uncontrolled. Update A1c today then titrate medications as needed.       Relevant Orders   Hemoglobin A1c   Ex-smoker    Congratulated on smoking cessation.       Exertional dyspnea    Chronic. Has had reassuring cardiac evaluation by [facility]. Spirometry in chart 08/2016 consistnet with mod severe restriction. Has not had formal pulmonary evaluation - will refer.       Relevant Orders   Ambulatory referral to Pulmonology   Gassiness    Trial protonix '40mg'$  daily.       Left flank mass    Discussed this. CT scan showed large sebaceous cyst (07/2016).       Obstructive sleep apnea    Known OSA. Previously had not tolerated CPAP machine. Now ready to readdress - will refer to pulm.       Relevant Orders   Ambulatory referral to Pulmonology   Panic attacks    Discussed this - rec start celexa '10mg'$  daily. Continue hydroxyzine PRN. Reassess at f/u visit.      Relevant Medications   citalopram (CELEXA)  10 MG tablet   Umbilical hernia without obstruction and without gangrene    Discussed this. Had eval by gen surgery - told optional surgery.           Meds ordered this encounter  Medications  . citalopram (CELEXA) 10 MG tablet    Sig: Take 1 tablet (10 mg total) by mouth daily.    Dispense:  30 tablet    Refill:  3  . pantoprazole (PROTONIX) 40 MG tablet    Sig: Take 1 tablet (40  mg total) by mouth daily.    Dispense:  30 tablet    Refill:  3   Orders Placed This Encounter  Procedures  . Hemoglobin A1c  . Ambulatory referral to Pulmonology    Referral Priority:   Routine    Referral Type:   Consultation    Referral Reason:   Specialty Services Required    Requested Specialty:   Pulmonary Disease    Number of Visits Requested:   1    Follow up plan: Return in about 3 months (around 09/17/2017) for follow up visit.  [name], MD

## 2017-06-20 NOTE — Assessment & Plan Note (Signed)
Chronic, uncontrolled. Update A1c today then titrate medications as needed.

## 2017-06-24 ENCOUNTER — Ambulatory Visit (INDEPENDENT_AMBULATORY_CARE_PROVIDER_SITE_OTHER): Payer: Medicare Other | Admitting: Internal Medicine

## 2017-06-24 ENCOUNTER — Encounter: Payer: Self-pay | Admitting: Internal Medicine

## 2017-06-24 ENCOUNTER — Institutional Professional Consult (permissible substitution): Payer: Medicare Other | Admitting: Internal Medicine

## 2017-06-24 VITALS — BP 142/90 | HR 92 | Ht 70.0 in | Wt 290.0 lb

## 2017-06-24 DIAGNOSIS — J42 Unspecified chronic bronchitis: Secondary | ICD-10-CM | POA: Diagnosis not present

## 2017-06-24 MED ORDER — TIOTROPIUM BROMIDE MONOHYDRATE 18 MCG IN CAPS: 18.0000 ug | ORAL_CAPSULE | Freq: Every day | RESPIRATORY_TRACT | 2 refills | Status: DC

## 2017-06-24 NOTE — Progress Notes (Signed)
[facility] Consultation      Assessment and Plan:  OSA.  -Patient was previously intolerant of CPAP, now developing recurrent symptoms of excessive daytime sleepiness. - Sleep study scheduled, will follow-up once results are available.  He is asked to call us if he has difficulties with tolerance of mask, he may benefit from referral to mask fitting clinic.  Insomnia with circadian rhythm disorder.  -Patient has been going to bed at 4 AM, waking at 7, and taking several naps per day.  He has been doing this for several years since he used to work third shift. -Recommended that he reduce his daytime naps, and increase his overnight sleep times.  Chronic bronchitis with dyspnea on exertion.  -Discussed with patient that he likely has some degree of chronic bronchitis versus emphysema/COPD from his history of smoking. - He also has likely some degree of deconditioning from his years of sedentary lifestyle and excess weight. -We will start Spiriva once daily.  -Discussed with him now that his echo stress test was negative there appears to be no contraindication to his increase in exercise.  Recommend that he start trying to walk or use his exercise bike for 30 minutes/day, and continue to work on weight loss.  Obesity.  --BMI=42.  - Discussed that weight loss will be beneficial for his health and his breathing.  Orders Placed This Encounter  Procedures  . Pulmonary Function Test [facility] Only   Meds ordered this encounter  Medications  . tiotropium (SPIRIVA HANDIHALER) 18 MCG inhalation capsule    Sig: Place 1 capsule (18 mcg total) into inhaler and inhale daily.    Dispense:  30 capsule    Refill:  2   Return in about 3 months (around 09/24/2017).    Date: 06/24/2017  MRN# [id number] [name] January 11, 1956    [name] is a 62 y.o. old male seen in consultation for chief complaint of:    Chief Complaint  Patient presents with  . Consult    ref  by Danise Mina for sleep issues: snores: wakes up choking for air; daytime sleepiness    HPI:   The patient is a 62 year old male with symptoms of excessive daytime sleepiness, he notes that he has a lot of difficulty falling and staying asleep.  He usually goes to bed between 4 AM and 6 AM, and gets out of bed 8 AM.  He feels that it takes 1 hour to fall asleep.  His Epworth is 22 today.  He has dyspnea with exertion, he had an echo which he understands was normal. He has had an ablation for afulutter. He smokes on and off, he has stopped smoking January 1st. In February he tried to increase his activity and found that he could not do much without getting short winded. He used to be able to do most his work around the house but not has trouble doing those things.  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  He has been told that he might has COPD. He borrowed his sisters inhaler of albuterol, he does not think that it helps.   He is noted to have OSA about 5 years ago, he was started on CPAP, and had difficulty when the pressure ramped up, and he would find the mask off him so he did not restart.  He goes to bed at 4 am, he used to work midnight shift, and has been in  this pattern for about 40 years. He gets out of bed at 7:30 am and then he dozes all day long. He takes several unplanned naps during the day.   Imaging personally reviewed; CXR 06/22/2016; mild hyperinflation, otherwise normal.   Stress Echo 06/13/17; normal stress., EF=50%.  PMHX:   Past Medical History:  Diagnosis Date  . Abnormal drug screen 2015   MJ positive x3 - one more and we will stop prescribing ativan (08/2013).  . Angina   . Anxiety   . Arthritis   . CAD (coronary artery disease)    nonobstructive  . Cannabis abuse   . Chronic systolic CHF (congestive heart failure) ([place]) 2013   NYHA Class II/III  . COPD (chronic obstructive pulmonary disease) ([place])   . Diabetes type 2,  uncontrolled ([place])    declines DSME  . Dilated cardiomyopathy ([place]) 2013   now improved  . Ex-smoker   . Frequent headaches   . History of atrial fibrillation 2013   chronic, s/p ablation prior on coumadin  . Hyperlipidemia   . Hypertension   . Migraine   . Nonischemic cardiomyopathy ([place]) 2013   EF 25% per Dr Nehemiah Massed  . Obesity   . OSA (obstructive sleep apnea)    does not use CPAP - unable to tolerate  . Seasonal allergies    Surgical Hx:  Past Surgical History:  Procedure Laterality Date  . ATRIAL FLUTTER ABLATION N/A 09/21/2011   Procedure: ATRIAL FLUTTER ABLATION;  Surgeon: Thompson Grayer, MD;  Location: [facility] CATH LAB;  Service: Cardiovascular;  Laterality: N/A;  . CARDIAC ELECTROPHYSIOLOGY [place] AND ABLATION  2013   for atrial flutter  . CARDIOVASCULAR STRESS TEST  03/2012   ETT WNL Nehemiah Massed)  . US ECHOCARDIOGRAPHY  2014   EF 50%, nl LV fxn, RV nl size/function, mild mitral insuff   Family Hx:  Family History  Problem Relation Age of Onset  . Heart failure Father 38  . Cancer Mother 42       NHL  . Hypertension Maternal Grandfather   . CAD Maternal Grandfather 48       MI  . Diabetes Other        grandparents  . Cancer Brother 22       lung (smoker)   Social Hx:   Social History   Tobacco Use  . Smoking status: Former Smoker    Packs/day: 1.00    Years: 12.00    Pack years: 12.00    Types: Cigarettes    Start date: 04/26/1968    Last attempt to quit: 04/26/2012    Years since quitting: 5.1  . Smokeless tobacco: Never Used  Substance Use Topics  . Alcohol use: No    Alcohol/week: 0.0 oz  . Drug use: Yes    Types: Marijuana   Medication:    Current Outpatient Medications:  .  ACCU-CHEK FASTCLIX LANCETS MISC, Check blood sugar once daily and as instructed. Dx 250.00, Disp: 100 each, Rfl: 3 .  aspirin EC 81 MG tablet, Take 81 mg by mouth daily., Disp: , Rfl:  .  Blood Glucose Monitoring Suppl (BLOOD GLUCOSE MONITOR SYSTEM) W/DEVICE KIT, by Does not  apply route. Use to check sugar once daily and as needed Dx: E11.9 **ONE TOUCH VERIO**, Disp: , Rfl:  .  carvedilol (COREG) 6.25 MG tablet, TAKE 1 TABLET BY MOUTH 2 TIMES DAILY WITH A MEAL., Disp: 180 tablet, Rfl: 1 .  citalopram (CELEXA) 10 MG tablet, Take 1 tablet (10 mg total)  by mouth daily., Disp: 30 tablet, Rfl: 3 .  fluticasone (FLONASE) 50 MCG/ACT nasal spray, Place 2 sprays into both nostrils daily., Disp: 16 g, Rfl: 3 .  furosemide (LASIX) 20 MG tablet, TAKE 1 TABLET (20 MG TOTAL) BY MOUTH 2 (TWO) TIMES DAILY AS NEEDED., Disp: 90 tablet, Rfl: 3 .  glimepiride (AMARYL) 2 MG tablet, TAKE 1 TABLET (2 MG TOTAL) BY MOUTH DAILY WITH BREAKFAST., Disp: 90 tablet, Rfl: 2 .  glucose blood (ONETOUCH VERIO) test strip, Use as instructed to check three times daily and as needed. E11.8, Disp: 100 each, Rfl: 11 .  hydrOXYzine (ATARAX/VISTARIL) 25 MG tablet, TAKE 1 TABLET (25 MG TOTAL) BY MOUTH 2 (TWO) TIMES DAILY AS NEEDED FOR ANXIETY., Disp: 60 tablet, Rfl: 5 .  metFORMIN (GLUCOPHAGE) 500 MG tablet, TAKE 1 TABLET BY MOUTH EVERY MORNING AND TAKE 2 TABLETS BY MOUTH EVERY EVENING, Disp: 270 tablet, Rfl: 1 .  montelukast (SINGULAIR) 10 MG tablet, TAKE 1 TABLET BY MOUTH EVERYDAY AT BEDTIME, Disp: 30 tablet, Rfl: 11 .  pantoprazole (PROTONIX) 40 MG tablet, Take 1 tablet (40 mg total) by mouth daily., Disp: 30 tablet, Rfl: 3 .  potassium chloride (KLOR-CON 10) 10 MEQ tablet, TAKE 1 TABLET (10 MEQ TOTAL) BY MOUTH DAILY., Disp: 90 tablet, Rfl: 0 .  Probiotic Product (PROBIOTIC DAILY PO), Take by mouth., Disp: , Rfl:  .  simvastatin (ZOCOR) 20 MG tablet, TAKE 1 TABLET (20 MG TOTAL) BY MOUTH EVERY EVENING., Disp: 90 tablet, Rfl: 2   Allergies:  Atorvastatin and Lisinopril  Review of Systems: Gen:  Denies  fever, sweats, chills HEENT: Denies blurred vision, double vision. bleeds, sore throat Cvc:  No dizziness, chest pain. Resp:   Denies cough or sputum production, shortness of breath Gi: Denies swallowing  difficulty, stomach pain. Gu:  Denies bladder incontinence, burning urine Ext:   No Joint pain, stiffness. Skin: No skin rash,  hives  Endoc:  No polyuria, polydipsia. Psych: No depression, insomnia. Other:  All other systems were reviewed with the patient and were negative other that what is mentioned in the HPI.   Physical Examination:   VS: BP (!) 142/90 (BP Location: Right Arm, Cuff Size: Normal)   Pulse 92   Ht _0  (1.778 m)   Wt 290 lb (131.5 kg)   SpO2 97%   BMI 41.61 kg/m   General Appearance: No distress  Neuro:without focal findings,  speech normal,  HEENT: PERRLA, EOM intact.   Pulmonary: normal breath sounds, No wheezing.  CardiovascularNormal S1,S2.  No m/r/g.   Abdomen: Benign, Soft, non-tender. Renal:  No costovertebral tenderness  GU:  No performed at this time. Endoc: No evident thyromegaly, no signs of acromegaly. Skin:   warm, no rashes, no ecchymosis  Extremities: normal, no cyanosis, clubbing.  Other findings:    LABORATORY PANEL:   CBC No results for input(s): WBC, HGB, HCT, PLT in the last 168 hours. ------------------------------------------------------------------------------------------------------------------  Chemistries  No results for input(s): NA, K, CL, CO2, GLUCOSE, BUN, CREATININE, CALCIUM, MG, AST, ALT, ALKPHOS, BILITOT in the last 168 hours.  Invalid input(s): GFRCGP ------------------------------------------------------------------------------------------------------------------  Cardiac Enzymes No results for input(s): TROPONINI in the last 168 hours. ------------------------------------------------------------  RADIOLOGY:  No results found.     Thank  you for the consultation and for allowing [name] to assist in the care of your patient. Our recommendations are noted above.  Please contact us if we can be of further service.   Marda Stalker, MD.  Board  Certified in Internal Medicine,  Pulmonary Medicine, [place], and Sleep Medicine.  [facility] Office Number: [phone]  Patricia Pesa, M.D.  Merton Border, M.D  06/24/2017

## 2017-06-24 NOTE — Patient Instructions (Addendum)
Recommend 30 minutes of exercise per day. Weight loss would be beneficial.  Try to move some of your sleep to the overnight period and reduce napping.  Start spiriva once daily.  Will send your pulmonary function test.   After you start CPAP, call us if you have problems with the mask.

## 2017-06-30 ENCOUNTER — Ambulatory Visit: Payer: Medicare Other | Attending: Neurology

## 2017-06-30 DIAGNOSIS — G4761 Periodic limb movement disorder: Secondary | ICD-10-CM | POA: Insufficient documentation

## 2017-06-30 DIAGNOSIS — Z6838 Body mass index (BMI) 38.0-38.9, adult: Secondary | ICD-10-CM | POA: Insufficient documentation

## 2017-06-30 DIAGNOSIS — G4733 Obstructive sleep apnea (adult) (pediatric): Secondary | ICD-10-CM | POA: Diagnosis not present

## 2017-07-14 ENCOUNTER — Ambulatory Visit (INDEPENDENT_AMBULATORY_CARE_PROVIDER_SITE_OTHER): Payer: Medicare Other | Admitting: Family Medicine

## 2017-07-14 ENCOUNTER — Encounter: Payer: Self-pay | Admitting: Family Medicine

## 2017-07-14 ENCOUNTER — Other Ambulatory Visit: Payer: Self-pay | Admitting: Family Medicine

## 2017-07-14 VITALS — BP 136/82 | HR 90 | Temp 97.9°F | Wt 292.0 lb

## 2017-07-14 DIAGNOSIS — Z87891 Personal history of nicotine dependence: Secondary | ICD-10-CM

## 2017-07-14 DIAGNOSIS — R29898 Other symptoms and signs involving the musculoskeletal system: Secondary | ICD-10-CM | POA: Insufficient documentation

## 2017-07-14 NOTE — Progress Notes (Signed)
BP 136/82 (BP Location: Left Arm, Patient Position: Sitting, Cuff Size: Large)   Pulse 90   Temp 97.9 F (36.6 C) (Oral)   Wt 292 lb (132.5 kg)   SpO2 94%   BMI 41.90 kg/m    CC: heavy legs, chest pressure Subjective:    Patient ID: [name], male    DOB: [birth year], 62 y.o.   MRN: [id number]  HPI: [name] is a 62 y.o. male presenting on 07/14/2017 for Gait Problem (2 nights ago, pt got up to go to restroom but could not put wt on LEs.  Says sensation is getting better but legs feel like they weigh "10,000" lbs.  Also, having tremors in right hand. ) and Chest Pain (C/o pressure in mid chest.  Not sure if related to the panic attack he had after the incident with LEs.)   2 nights ago woke up to go to the bathroom - fell down due to leg weakness. "no feeling in right leg." Has felt more unsteady since then. R>L hand tremor. Body feels heavy at times - fatigued. Some chest tightness - attributes to anxiety. Feels he's not fully lifting R leg.  Denies headaches, vision changes, fevers/chills, slurred speech or confusion, facial weakness, drooling.  Some ongoing paresthesias of feet - nothing new. No new body paresthesias or numbness.   Last month we started celexa 67m daily for anxiety.   H/o atrial flutter s/p ablation 2013. ETT WNL 03/2012. Recent reassuring stress echo 05/2017. Chronic exertional dyspnea/fatigue. Known moderate/severe restrictive lung disease. Back on CPAP for known OSA. Fully quit smoking 04/2017. Last visit we referred to pulmonology (Ashby Dawes - note reviewed. Spiriva started. Pending getting CPAP set up.   Relevant past medical, surgical, family and social history reviewed and updated as indicated. Interim medical history since our last visit reviewed. Allergies and medications reviewed and updated. Outpatient Medications Prior to Visit  Medication Sig Dispense Refill  . ACCU-CHEK FASTCLIX LANCETS MISC Check blood sugar once daily and as  instructed. Dx 250.00 100 each 3  . aspirin EC 81 MG tablet Take 81 mg by mouth daily.    . Blood Glucose Monitoring Suppl (BLOOD GLUCOSE MONITOR SYSTEM) W/DEVICE KIT by Does not apply route. 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 citalopram (CELEXA) 10 MG tablet Take 1 tablet (10 mg total) by mouth daily. 30 tablet 3  . fluticasone (FLONASE) 50 MCG/ACT nasal spray Place 2 sprays into both nostrils daily. 16 g 3  . furosemide (LASIX) 20 MG tablet TAKE 1 TABLET (20 MG TOTAL) BY MOUTH 2 (TWO) TIMES DAILY AS NEEDED. 90 tablet 3  . glimepiride (AMARYL) 2 MG tablet TAKE 1 TABLET (2 MG TOTAL) BY MOUTH DAILY WITH BREAKFAST. 90 tablet 2  . glucose blood (ONETOUCH VERIO) test strip Use as instructed to check three times daily and as needed. E11.8 100 each 11  . hydrOXYzine (ATARAX/VISTARIL) 25 MG tablet TAKE 1 TABLET (25 MG TOTAL) BY MOUTH 2 (TWO) TIMES DAILY AS NEEDED FOR ANXIETY. 60 tablet 5  . metFORMIN (GLUCOPHAGE) 500 MG tablet TAKE 1 TABLET BY MOUTH EVERY MORNING AND TAKE 2 TABLETS BY MOUTH EVERY EVENING 270 tablet 1  . montelukast (SINGULAIR) 10 MG tablet TAKE 1 TABLET BY MOUTH EVERYDAY AT BEDTIME 30 tablet 11  . pantoprazole (PROTONIX) 40 MG tablet Take 1 tablet (40 mg total) by mouth daily. 30 tablet 3  . potassium chloride (KLOR-CON 10) 10  MEQ tablet TAKE 1 TABLET (10 MEQ TOTAL) BY MOUTH DAILY. 90 tablet 0  . Probiotic Product (PROBIOTIC DAILY PO) Take by mouth.    . simvastatin (ZOCOR) 20 MG tablet TAKE 1 TABLET (20 MG TOTAL) BY MOUTH EVERY EVENING. 90 tablet 2  . tiotropium (SPIRIVA HANDIHALER) 18 MCG inhalation capsule Place 1 capsule (18 mcg total) into inhaler and inhale daily. 30 capsule 2  . carvedilol (COREG) 6.25 MG tablet TAKE 1 TABLET BY MOUTH 2 TIMES DAILY WITH A MEAL. 180 tablet 1   No facility-administered medications prior to visit.      Per HPI unless specifically indicated in ROS section below Review of Systems     Objective:    BP  136/82 (BP Location: Left Arm, Patient Position: Sitting, Cuff Size: Large)   Pulse 90   Temp 97.9 F (36.6 C) (Oral)   Wt 292 lb (132.5 kg)   SpO2 94%   BMI 41.90 kg/m   Wt Readings from Last 3 Encounters:  07/14/17 292 lb (132.5 kg)  06/24/17 290 lb (131.5 kg)  06/20/17 285 lb 4 oz (129.4 kg)    Physical Exam  Constitutional: He appears well-developed and well-nourished. No distress.  HENT:  Head: Normocephalic and atraumatic.  Mouth/Throat: Oropharynx is clear and moist. No oropharyngeal exudate.  Eyes: Pupils are equal, round, and reactive to light. Conjunctivae and EOM are normal.  Neck: Normal range of motion. Neck supple. Carotid bruit is not present. No thyromegaly present.  Cardiovascular: Normal rate, regular rhythm, normal heart sounds and intact distal pulses.  No murmur heard. Pulmonary/Chest: Effort normal and breath sounds normal. No respiratory distress. He has no wheezes. He has no rales.  Musculoskeletal: He exhibits no edema.  Neurological: He is alert. He has normal strength. No cranial nerve deficit or sensory deficit. He displays a negative Romberg sign. Coordination and gait normal.  5/5 strength BUE, BLE, grip strength intact CN 2-12 intact FTN intact EOMI No pronator drift Able to heel and toe walk  Skin: Skin is warm and dry.  Psychiatric: He has a normal mood and affect.  Nursing note and vitals reviewed.  Results for orders placed or performed in visit on 06/20/17  Hemoglobin A1c  Result Value Ref Range   Hgb A1c MFr Bld 7.6 (H) 4.6 - 6.5 %      Assessment & Plan:   Problem List Items Addressed This Visit    Ex-smoker    Congratulated on continued abstention.      Right leg weakness - Primary    Transient weakness of R leg affecting ambulation that has now resolved. Exam reassuring today. Possibly just from nerve compression during sleep leading to transient paresthesias/weakness, but ddx would include TIA. Continue aspirin. Advised to let  us know right away if any recurrence for further evaluation including head CT. Pt agrees with plan.           No orders of the defined types were placed in this encounter.  No orders of the defined types were placed in this encounter.   Follow up plan: Return if symptoms worsen or fail to improve.  [name], MD

## 2017-07-14 NOTE — Assessment & Plan Note (Signed)
Congratulated on continued abstention.

## 2017-07-14 NOTE — Assessment & Plan Note (Addendum)
Transient weakness of R leg affecting ambulation that has now resolved. Exam reassuring today. Possibly just from nerve compression during sleep leading to transient paresthesias/weakness, but ddx would include TIA. Continue aspirin. Advised to let us know right away if any recurrence for further evaluation including head CT. Pt agrees with plan.

## 2017-07-14 NOTE — Patient Instructions (Signed)
Exam overall ok today - we will watch symptoms for now.  If recurrent, let me know and I will order head CT.  Good to see you today.

## 2017-07-24 ENCOUNTER — Other Ambulatory Visit: Payer: Self-pay | Admitting: Family Medicine

## 2017-07-27 DIAGNOSIS — I4891 Unspecified atrial fibrillation: Secondary | ICD-10-CM | POA: Diagnosis not present

## 2017-07-27 DIAGNOSIS — I714 Abdominal aortic aneurysm, without rupture: Secondary | ICD-10-CM | POA: Diagnosis not present

## 2017-07-27 DIAGNOSIS — I25118 Atherosclerotic heart disease of native coronary artery with other forms of angina pectoris: Secondary | ICD-10-CM | POA: Diagnosis not present

## 2017-07-27 DIAGNOSIS — G4733 Obstructive sleep apnea (adult) (pediatric): Secondary | ICD-10-CM | POA: Diagnosis not present

## 2017-07-27 DIAGNOSIS — I420 Dilated cardiomyopathy: Secondary | ICD-10-CM | POA: Diagnosis not present

## 2017-08-11 DIAGNOSIS — J42 Unspecified chronic bronchitis: Secondary | ICD-10-CM | POA: Diagnosis not present

## 2017-08-11 DIAGNOSIS — G4733 Obstructive sleep apnea (adult) (pediatric): Secondary | ICD-10-CM | POA: Diagnosis not present

## 2017-08-13 ENCOUNTER — Other Ambulatory Visit: Payer: Self-pay | Admitting: Family Medicine

## 2017-08-15 NOTE — Telephone Encounter (Signed)
Electronic refill request Last office visit 07/14/17 Last refill 01/03/17 #60/5

## 2017-09-10 ENCOUNTER — Other Ambulatory Visit: Payer: Self-pay | Admitting: Family Medicine

## 2017-09-10 DIAGNOSIS — G4733 Obstructive sleep apnea (adult) (pediatric): Secondary | ICD-10-CM | POA: Diagnosis not present

## 2017-10-10 ENCOUNTER — Ambulatory Visit: Payer: Medicare Other

## 2017-10-10 ENCOUNTER — Ambulatory Visit: Payer: Self-pay

## 2017-10-11 ENCOUNTER — Other Ambulatory Visit: Payer: Self-pay | Admitting: Family Medicine

## 2017-10-11 DIAGNOSIS — G4733 Obstructive sleep apnea (adult) (pediatric): Secondary | ICD-10-CM | POA: Diagnosis not present

## 2017-10-11 NOTE — Telephone Encounter (Signed)
Copied from [place] [id number]. Topic: Quick Communication - Rx Refill/Question >> Oct 11, 2017 11:49 AM Yvette Rack wrote: Medication: fluticasone (FLONASE) 50 MCG/ACT nasal spray   Preferred Pharmacy (with phone number or street name): [id number] [place], [place] [phone] (Phone) [phone] (Fax)   Agent: Please be advised that RX refills may take up to 3 business days. We ask that you follow-up with your pharmacy.

## 2017-10-12 MED ORDER — FLUTICASONE PROPIONATE 50 MCG/ACT NA SUSP: 2.0000 | Freq: Every day | NASAL | 0 refills | Status: DC

## 2017-10-12 NOTE — Telephone Encounter (Signed)
Rx refill request: Flonase 50 MCG/ACT       Last filled: 06/14/13 3RF   LOV: 07/14/17  PCP: Danise Mina  Pharmacy:verified

## 2017-10-15 ENCOUNTER — Other Ambulatory Visit: Payer: Self-pay | Admitting: Family Medicine

## 2017-10-17 ENCOUNTER — Telehealth: Payer: Self-pay | Admitting: Family Medicine

## 2017-10-17 ENCOUNTER — Encounter: Payer: Self-pay | Admitting: Family Medicine

## 2017-10-17 ENCOUNTER — Ambulatory Visit (INDEPENDENT_AMBULATORY_CARE_PROVIDER_SITE_OTHER): Payer: Medicare Other | Admitting: Family Medicine

## 2017-10-17 VITALS — BP 132/74 | HR 79 | Temp 98.0°F | Ht 69.5 in | Wt 265.2 lb

## 2017-10-17 DIAGNOSIS — Z125 Encounter for screening for malignant neoplasm of prostate: Secondary | ICD-10-CM | POA: Diagnosis not present

## 2017-10-17 DIAGNOSIS — K429 Umbilical hernia without obstruction or gangrene: Secondary | ICD-10-CM | POA: Diagnosis not present

## 2017-10-17 DIAGNOSIS — Z87891 Personal history of nicotine dependence: Secondary | ICD-10-CM | POA: Diagnosis not present

## 2017-10-17 DIAGNOSIS — R140 Abdominal distension (gaseous): Secondary | ICD-10-CM

## 2017-10-17 DIAGNOSIS — R519 Headache, unspecified: Secondary | ICD-10-CM

## 2017-10-17 DIAGNOSIS — Z9989 Dependence on other enabling machines and devices: Secondary | ICD-10-CM

## 2017-10-17 DIAGNOSIS — E785 Hyperlipidemia, unspecified: Secondary | ICD-10-CM | POA: Diagnosis not present

## 2017-10-17 DIAGNOSIS — R5381 Other malaise: Secondary | ICD-10-CM

## 2017-10-17 DIAGNOSIS — M542 Cervicalgia: Secondary | ICD-10-CM | POA: Diagnosis not present

## 2017-10-17 DIAGNOSIS — G4733 Obstructive sleep apnea (adult) (pediatric): Secondary | ICD-10-CM | POA: Diagnosis not present

## 2017-10-17 DIAGNOSIS — I428 Other cardiomyopathies: Secondary | ICD-10-CM

## 2017-10-17 DIAGNOSIS — Z0000 Encounter for general adult medical examination without abnormal findings: Secondary | ICD-10-CM | POA: Diagnosis not present

## 2017-10-17 DIAGNOSIS — E1165 Type 2 diabetes mellitus with hyperglycemia: Secondary | ICD-10-CM

## 2017-10-17 DIAGNOSIS — E118 Type 2 diabetes mellitus with unspecified complications: Secondary | ICD-10-CM | POA: Diagnosis not present

## 2017-10-17 DIAGNOSIS — R5383 Other fatigue: Secondary | ICD-10-CM

## 2017-10-17 DIAGNOSIS — IMO0002 Reserved for concepts with insufficient information to code with codable children: Secondary | ICD-10-CM

## 2017-10-17 DIAGNOSIS — R51 Headache: Secondary | ICD-10-CM | POA: Diagnosis not present

## 2017-10-17 DIAGNOSIS — I10 Essential (primary) hypertension: Secondary | ICD-10-CM | POA: Diagnosis not present

## 2017-10-17 DIAGNOSIS — Z7189 Other specified counseling: Secondary | ICD-10-CM

## 2017-10-17 LAB — COMPREHENSIVE METABOLIC PANEL
ALT: 19 U/L (ref 0–53)
AST: 25 U/L (ref 0–37)
Albumin: 4.5 g/dL (ref 3.5–5.2)
Alkaline Phosphatase: 56 U/L (ref 39–117)
BUN: 6 mg/dL (ref 6–23)
CHLORIDE: 100 meq/L (ref 96–112)
CO2: 29 meq/L (ref 19–32)
CREATININE: 0.63 mg/dL (ref 0.40–1.50)
Calcium: 9.8 mg/dL (ref 8.4–10.5)
GFR: 137.12 mL/min (ref >60.00)
Glucose, Bld: 123 mg/dL — ABNORMAL HIGH (ref 70–99)
POTASSIUM: 4.6 meq/L (ref 3.5–5.1)
SODIUM: 141 meq/L (ref 135–145)
Total Bilirubin: 0.4 mg/dL (ref 0.2–1.2)
Total Protein: 7 g/dL (ref 6.0–8.3)

## 2017-10-17 LAB — CBC WITH DIFFERENTIAL/PLATELET
BASOS ABS: 0.1 10*3/uL (ref 0.0–0.1)
Basophils Relative: 0.5 % (ref 0.0–3.0)
EOS ABS: 0.2 10*3/uL (ref 0.0–0.7)
EOS PCT: 1.7 % (ref 0.0–5.0)
HEMATOCRIT: 44.3 % (ref 39.0–52.0)
HEMOGLOBIN: 15 g/dL (ref 13.0–17.0)
LYMPHS ABS: 3.3 10*3/uL (ref 0.7–4.0)
Lymphocytes Relative: 25.9 % (ref 12.0–46.0)
MCHC: 33.9 g/dL (ref 30.0–36.0)
MCV: 88.9 fl (ref 78.0–100.0)
MONOS PCT: 8.4 % (ref 3.0–12.0)
Monocytes Absolute: 1.1 10*3/uL — ABNORMAL HIGH (ref 0.1–1.0)
Neutro Abs: 8.1 10*3/uL — ABNORMAL HIGH (ref 1.4–7.7)
Neutrophils Relative %: 63.5 % (ref 43.0–77.0)
Platelets: 253 10*3/uL (ref 150.0–400.0)
RBC: 4.99 Mil/uL (ref 4.22–5.81)
RDW: 13.9 % (ref 11.5–15.5)
WBC: 12.8 10*3/uL — AB (ref 4.0–10.5)

## 2017-10-17 LAB — MICROALBUMIN / CREATININE URINE RATIO
Creatinine,U: 101 mg/dL
MICROALB UR: 1 mg/dL (ref 0.0–1.9)
MICROALB/CREAT RATIO: 1 mg/g (ref 0.0–30.0)

## 2017-10-17 LAB — LIPID PANEL
Cholesterol: 153 mg/dL (ref 0–200)
HDL: 40.5 mg/dL (ref >39.00)
NonHDL: 112.52
Total CHOL/HDL Ratio: 4
Triglycerides: 207 mg/dL — ABNORMAL HIGH (ref 0.0–149.0)
VLDL: 41.4 mg/dL — AB (ref 0.0–40.0)

## 2017-10-17 LAB — PSA, MEDICARE: PSA: 0.77 ng/ml (ref 0.10–4.00)

## 2017-10-17 LAB — HEMOGLOBIN A1C: HEMOGLOBIN A1C: 6.8 % — AB (ref 4.6–6.5)

## 2017-10-17 LAB — SEDIMENTATION RATE: SED RATE: 13 mm/h (ref 0–20)

## 2017-10-17 LAB — LDL CHOLESTEROL, DIRECT: LDL DIRECT: 88 mg/dL

## 2017-10-17 MED ORDER — METHOCARBAMOL 500 MG PO TABS: 500.0000 mg | ORAL_TABLET | Freq: Two times a day (BID) | ORAL | 0 refills | Status: DC | PRN

## 2017-10-17 MED ORDER — CYCLOBENZAPRINE HCL 5 MG PO TABS: 5.0000 mg | ORAL_TABLET | Freq: Two times a day (BID) | ORAL | 0 refills | Status: DC | PRN

## 2017-10-17 MED ORDER — CITALOPRAM HYDROBROMIDE 10 MG PO TABS: 10.0000 mg | ORAL_TABLET | Freq: Every day | ORAL | 11 refills | Status: DC

## 2017-10-17 MED ORDER — PANTOPRAZOLE SODIUM 40 MG PO TBEC: 40.0000 mg | DELAYED_RELEASE_TABLET | Freq: Every day | ORAL | 11 refills | Status: DC

## 2017-10-17 MED ORDER — AMOXICILLIN-POT CLAVULANATE 875-125 MG PO TABS: 1.0000 | ORAL_TABLET | Freq: Two times a day (BID) | ORAL | 0 refills | Status: AC

## 2017-10-17 NOTE — Assessment & Plan Note (Signed)
Large, has had gen surg eval - told optional

## 2017-10-17 NOTE — Assessment & Plan Note (Signed)
Advanced directives - has discussed with family.No children.Unsure who would be HCPOA. (?sister who lives in [place]).Does not want prolonged life support. Packet provided previously.

## 2017-10-17 NOTE — Telephone Encounter (Signed)
I've sent in flexeril for him to try in its place.

## 2017-10-17 NOTE — Telephone Encounter (Signed)
Copied from [place] [id number]. Topic: Quick Communication - See Telephone Encounter >> Oct 17, 2017 12:08 PM Synthia Innocent wrote: CRM for notification. See Telephone encounter for: 10/17/17. Insurance does not cover, methocarbamol (ROBAXIN) 500 MG tablet, requesting something else to be called in. Please advise

## 2017-10-17 NOTE — Assessment & Plan Note (Signed)

## 2017-10-17 NOTE — Assessment & Plan Note (Addendum)
Chronic. Update A1c.  RTC 6 mo DM f/u visit.

## 2017-10-17 NOTE — Assessment & Plan Note (Signed)
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 Stable on recent cards eval.

## 2017-10-17 NOTE — Assessment & Plan Note (Signed)
Anticipate due to neck strain. Rx methocarbamol and heating pad.

## 2017-10-17 NOTE — Assessment & Plan Note (Signed)
This has improved with PPI.

## 2017-10-17 NOTE — Assessment & Plan Note (Signed)
Congratulated on weight loss to date - almost 30 lbs! Pt motivated to continue efforts.

## 2017-10-17 NOTE — Patient Instructions (Addendum)
Return stool kit for colon cancer screening.  If interested, check with pharmacy about new 2 shot shingles series (shingrix).  Work on fully quitting smoking.  Schedule eye exam as you're due.  Labs today.  Try robaxin muscle relaxant for neck pain, may also use heating pad.  You are doing well - congratulations on weight loss! Return as needed or in 6 months for follow up visit.

## 2017-10-17 NOTE — Assessment & Plan Note (Signed)
Chronic, stable. Continue current regimen. 

## 2017-10-17 NOTE — Assessment & Plan Note (Signed)
Chronic, stable. Continue zocor. Update FLP.  The 10-year ASCVD risk score [name] DC [name]., et al., 2013) is: 21.1%   Values used to calculate the score:     Age: 62 years     Sex: Male     Is Non-Hispanic African American: No     Diabetic: Yes     Tobacco smoker: No     Systolic Blood Pressure: 836 mmHg     Is BP treated: Yes     HDL Cholesterol: 33 mg/dL     Total Cholesterol: 131 mg/dL

## 2017-10-17 NOTE — Progress Notes (Signed)
BP 132/74 (BP Location: Left Arm, Patient Position: Sitting, Cuff Size: Large)   Pulse 79   Temp 98 F (36.7 C) (Oral)   Ht 5' 9.5" (1.765 m)   Wt 265 lb 4 oz (120.3 kg)   SpO2 97%   BMI 38.61 kg/m    CC: HA, AMW Subjective:    Patient ID: [name], male    DOB: 05/27/1955, 62 y.o.   MRN: [id number]  HPI: [name] is a 62 y.o. male presenting on 10/17/2017 for Medicare Wellness and Headache (C/o pain in head behind right eye with coughing, sneezing or holding breath to lift something. Has had for more than 1 wk. )    Hearing Screening   '125Hz'  '250Hz'  '500Hz'  '1000Hz'  '2000Hz'  '3000Hz'  '4000Hz'  '6000Hz'  '8000Hz'   Right ear:   40 40 25  0    Left ear:   0 0 0  0    Comments: Heard practice tone in left ear   Visual Acuity Screening   Right eye Left eye Both eyes  Without correction: '20/25 20/25 20/20 '  With correction:     2+ falls in the last year - fell off ladder and off stool. Both mechanical falls. No premonitory sxs.  Denies depression.  Did not see Katha Cabal this year.  GF Dawn is moving out.   Recent dx OSA - started on CPAP and using regularly at least 4 hours at night. Increased energy noted - he started push mowing lawn. Dyspnea has improved some. Saw [facility] pulm and [facility] pulm.   Has lost 30 lbs, stopped bread. Max cbg 150.   1+ wk h/o headache over R eye, worse with heavy lifting or straining, worse with swallowing. No ST. No sinus congestion. Some pain at R temple, some R occipital pain and some R sided neck pain. No vision changes. No fevers/chills.   Daily PPI has helped gas pains.  Preventative: Colon cancer screening -doesn't want to do due to umbilical hernia.requests new iFOB. Never returned previous iFOBs. Prostate cancer screening - discussed, agrees today.  Flu shot - yearly Tetanus thinks 2008 Pneumovax per patient done in hospital 02/2013 shingrix - discussed  Advanced directives - has discussed with family.No children.Unsure who would be HCPOA.  (?sister who lives in [place]).Does not want prolonged life support. Packet provided previously.  Seat belt use discussed Sunscreen use discussed. No changing moles on skin.  Ex smoker 2014 - occasional cigarette. GF smokes.  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 Interim medical history since our last visit reviewed. Allergies and medications reviewed and updated. Outpatient Medications Prior to Visit  Medication Sig Dispense Refill  . ACCU-CHEK FASTCLIX LANCETS MISC Check blood sugar once daily and as instructed. Dx 250.00 100 each 3  . aspirin EC 81 MG tablet Take 81 mg by mouth daily.    . Blood Glucose Monitoring Suppl (BLOOD GLUCOSE MONITOR SYSTEM) W/DEVICE KIT by Does not apply route. 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 fluticasone (FLONASE) 50 MCG/ACT nasal spray Place 2 sprays into both nostrils daily. 16 g 0  . furosemide (LASIX) 20 MG tablet  TAKE 1 TABLET (20 MG TOTAL) BY MOUTH 2 (TWO) TIMES DAILY AS NEEDED. 90 tablet 0  . glimepiride (AMARYL) 2 MG tablet TAKE 1 TABLET (2 MG TOTAL) BY MOUTH DAILY WITH BREAKFAST. 90 tablet 0  . glucose blood (ONETOUCH VERIO) test strip Use as instructed to check three times daily and as needed. E11.8 100 each 11  . hydrOXYzine (ATARAX/VISTARIL) 25 MG tablet TAKE 1 TABLET (25 MG TOTAL) BY MOUTH 2 (TWO) TIMES DAILY AS NEEDED FOR ANXIETY. 60 tablet 5  . metFORMIN (GLUCOPHAGE) 500 MG tablet TAKE 1 TABLET BY MOUTH EVERY MORNING AND TAKE 2 TABLETS BY MOUTH EVERY EVENING 270 tablet 1  . montelukast (SINGULAIR) 10 MG tablet TAKE 1 TABLET BY MOUTH EVERYDAY AT BEDTIME 30  tablet 11  . potassium chloride (KLOR-CON 10) 10 MEQ tablet TAKE 1 TABLET (10 MEQ TOTAL) BY MOUTH DAILY. 90 tablet 0  . Probiotic Product (PROBIOTIC DAILY PO) Take by mouth.    . simvastatin (ZOCOR) 20 MG tablet TAKE 1 TABLET (20 MG TOTAL) BY MOUTH EVERY EVENING. 90 tablet 2  . tiotropium (SPIRIVA HANDIHALER) 18 MCG inhalation capsule Place 1 capsule (18 mcg total) into inhaler and inhale daily. 30 capsule 2  . citalopram (CELEXA) 10 MG tablet Take 1 tablet (10 mg total) by mouth daily. 30 tablet 3  . pantoprazole (PROTONIX) 40 MG tablet Take 1 tablet (40 mg total) by mouth daily. 30 tablet 3   No facility-administered medications prior to visit.      Per HPI unless specifically indicated in ROS section below Review of Systems     Objective:    BP 132/74 (BP Location: Left Arm, Patient Position: Sitting, Cuff Size: Large)   Pulse 79   Temp 98 F (36.7 C) (Oral)   Ht 5' 9.5" (1.765 m)   Wt 265 lb 4 oz (120.3 kg)   SpO2 97%   BMI 38.61 kg/m   Wt Readings from Last 3 Encounters:  10/17/17 265 lb 4 oz (120.3 kg)  07/14/17 292 lb (132.5 kg)  06/24/17 290 lb (131.5 kg)    Physical Exam  Constitutional: He is oriented to person, place, and time. He appears well-developed and well-nourished. No distress.  HENT:  Head: Normocephalic and atraumatic.  Right Ear: Hearing, tympanic membrane, external ear and ear canal normal.  Left Ear: Hearing, tympanic membrane, external ear and ear canal normal.  Nose: No mucosal edema or rhinorrhea. Right sinus exhibits frontal sinus tenderness. Right sinus exhibits no maxillary sinus tenderness. Left sinus exhibits no maxillary sinus tenderness and no frontal sinus tenderness.  Mouth/Throat: Uvula is midline, oropharynx is clear and moist and mucous membranes are normal. No oropharyngeal exudate, posterior oropharyngeal edema or posterior oropharyngeal erythema.  No temporal bruit appreciated  Eyes: Pupils are equal, round, and reactive to light.  Conjunctivae and EOM are normal. No scleral icterus.  Neck: Normal range of motion. Neck supple. Carotid bruit is not present.  Cardiovascular: Normal rate, regular rhythm, normal heart sounds and intact distal pulses.  No murmur heard. Pulses:      Radial pulses are 2+ on the right side, and 2+ on the left side.  Pulmonary/Chest: Effort normal and breath sounds normal. No respiratory distress. He has no wheezes. He has no rales.  Abdominal: Soft. Bowel sounds are normal. He exhibits no distension and no mass. There is no tenderness. There is no rebound, no guarding and no CVA tenderness. A hernia (large umbilical) is present.  Musculoskeletal: Normal range of motion. He exhibits  no edema.  Tender to palpation R paracervical mm, preserved ROM  Lymphadenopathy:    He has no cervical adenopathy.  Neurological: He is alert and oriented to person, place, and time.  CN grossly intact, station and gait intact Recall 3/3 Calculation 5/5 serial 3s  Skin: Skin is warm and dry. No rash noted.  Psychiatric: He has a normal mood and affect. His behavior is normal. Judgment and thought content normal.  Nursing note and vitals reviewed.  Results for orders placed or performed in visit on 06/20/17  Hemoglobin A1c  Result Value Ref Range   Hgb A1c MFr Bld 7.6 (H) 4.6 - 6.5 %      Assessment & Plan:   Problem List Items Addressed This Visit    Umbilical hernia without obstruction and without gangrene    Large, has had gen surg eval - told optional      Severe obesity (BMI 35.0-39.9) with comorbidity ([place])    Congratulated on weight loss to date - almost 30 lbs! Pt motivated to continue efforts.       Right-sided headache    TTH vs sinusitis vs GCA - check ESR. Treat as sinusitis with augmentin course and update with effect. If persistent or worsening headache, low threshold for further imaging vs eval by rheum.       Relevant Orders   CBC with Differential/Platelet   Sedimentation rate    OSA on CPAP    Has recently started CPAP, overall getting benefit from this. Appreciate pulm care, encouraged he schedule f/u.      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 Stable on recent cards eval.       Neck pain on right side    Anticipate due to neck strain. Rx methocarbamol and heating pad.       Medicare annual wellness visit, subsequent - Primary    I have personally reviewed the Medicare Annual Wellness questionnaire and have noted 1. The patient's medical and social history 2. Their use of alcohol, tobacco or illicit drugs 3. Their current medications and supplements 4. The patient's functional ability including ADL's, fall risks, home safety risks and hearing or visual impairment. Cognitive function has been assessed and addressed as indicated.  5. Diet and physical activity 6. Evidence for depression or mood disorders The patients weight, height, BMI have been recorded in the chart. I have made referrals, counseling and provided education to the patient based on review of the above and I have provided the pt with a written personalized care plan for preventive services. Provider list updated.. See scanned questionairre as needed for further documentation. Reviewed preventative protocols and updated unless pt declined.       Malaise and fatigue    This is improving since starting CPAP.       Hypertension    Chronic, stable. Continue current regimen.       Hyperlipidemia    Chronic, stable. Continue zocor. Update FLP.  The 10-year ASCVD risk score Mikey Bussing DC Brooke Bonito., et al., 2013) is: 21.1%   Values used to calculate the score:     Age: 52 years     Sex: Male     Is Non-Hispanic African American: No     Diabetic: Yes     Tobacco smoker: No     Systolic Blood Pressure: 539 mmHg     Is BP treated: Yes     HDL Cholesterol: 33 mg/dL  Total Cholesterol: 131 mg/dL       Relevant Orders   Lipid panel    Comprehensive metabolic panel   Gassiness    This has improved with PPI.       Ex-smoker    Continued occasional smoker, encouraged full cessation.       Diabetes mellitus type 2 with complications, uncontrolled (HCC)    Chronic. Update A1c.  RTC 6 mo DM f/u visit.       Relevant Orders   Hemoglobin A1c   Microalbumin / creatinine urine ratio   Advanced care planning/counseling discussion    Advanced directives - has discussed with family.No children.Unsure who would be HCPOA. (?sister who lives in [place]).Does not want prolonged life support. Packet provided previously.         Other Visit Diagnoses    Special screening for malignant neoplasm of prostate       Relevant Orders   PSA, Medicare       Meds ordered this encounter  Medications  . citalopram (CELEXA) 10 MG tablet    Sig: Take 1 tablet (10 mg total) by mouth daily.    Dispense:  30 tablet    Refill:  11  . pantoprazole (PROTONIX) 40 MG tablet    Sig: Take 1 tablet (40 mg total) by mouth daily.    Dispense:  30 tablet    Refill:  11  . methocarbamol (ROBAXIN) 500 MG tablet    Sig: Take 1-2 tablets (500-1,000 mg total) by mouth 2 (two) times daily as needed for muscle spasms (sedation precautions).    Dispense:  40 tablet    Refill:  0  . amoxicillin-clavulanate (AUGMENTIN) 875-125 MG tablet    Sig: Take 1 tablet by mouth 2 (two) times daily for 10 days.    Dispense:  20 tablet    Refill:  0   Orders Placed This Encounter  Procedures  . Lipid panel  . Comprehensive metabolic panel  . Hemoglobin A1c  . Microalbumin / creatinine urine ratio  . PSA, Medicare  . CBC with Differential/Platelet  . Sedimentation rate    Follow up plan: Return in about 6 months (around 04/18/2018) for follow up visit.  [name], MD

## 2017-10-17 NOTE — Telephone Encounter (Signed)
Spoke with pt notifying him Flexeril was sent in place of methocarbamol.  Verbalizes understanding and expresses his thanks.

## 2017-10-17 NOTE — Assessment & Plan Note (Signed)
Continued occasional smoker, encouraged full cessation.

## 2017-10-17 NOTE — Assessment & Plan Note (Signed)
Has recently started CPAP, overall getting benefit from this. Appreciate pulm care, encouraged he schedule f/u.

## 2017-10-17 NOTE — Assessment & Plan Note (Signed)
TTH vs sinusitis vs GCA - check ESR. Treat as sinusitis with augmentin course and update with effect. If persistent or worsening headache, low threshold for further imaging vs eval by rheum.

## 2017-10-17 NOTE — Assessment & Plan Note (Signed)
This is improving since starting CPAP.

## 2017-10-24 ENCOUNTER — Other Ambulatory Visit: Payer: Self-pay | Admitting: Family Medicine

## 2017-10-31 ENCOUNTER — Telehealth: Payer: Self-pay | Admitting: Family Medicine

## 2017-10-31 NOTE — Telephone Encounter (Signed)
Copied from [place] [id number]. Topic: Quick Communication - Rx Refill/Question >> Oct 31, 2017 12:09 PM Oliver Pila B wrote: Medication: glimepiride (AMARYL) 2 MG tablet [[id number]]   Has the patient contacted their pharmacy? Yes.   (Agent: If no, request that the patient contact the pharmacy for the refill.) (Agent: If yes, when and what did the pharmacy advise?)  [facility]  Agent: Please be advised that RX refills may take up to 3 business days. We ask that you follow-up with your pharmacy.

## 2017-11-01 MED ORDER — GLIMEPIRIDE 2 MG PO TABS: 2.0000 mg | ORAL_TABLET | Freq: Every day | ORAL | 1 refills | Status: DC

## 2017-11-01 NOTE — Telephone Encounter (Signed)
Incoming call from patient requesting refill on amaryl 2 mg  LOV  6/ 24/19  [place] refill  72/19  [facility]

## 2017-11-03 ENCOUNTER — Other Ambulatory Visit: Payer: Self-pay | Admitting: Family Medicine

## 2017-11-07 ENCOUNTER — Ambulatory Visit: Payer: Medicare Other | Admitting: Family Medicine

## 2017-11-07 DIAGNOSIS — Z0289 Encounter for other administrative examinations: Secondary | ICD-10-CM

## 2017-11-08 ENCOUNTER — Other Ambulatory Visit: Payer: Self-pay | Admitting: Family Medicine

## 2017-11-09 ENCOUNTER — Other Ambulatory Visit: Payer: Self-pay | Admitting: Family Medicine

## 2017-11-10 DIAGNOSIS — G4733 Obstructive sleep apnea (adult) (pediatric): Secondary | ICD-10-CM | POA: Diagnosis not present

## 2017-11-20 ENCOUNTER — Other Ambulatory Visit: Payer: Self-pay | Admitting: Family Medicine

## 2017-11-22 NOTE — Telephone Encounter (Signed)
Cyclobenzaprine Last filled:  10/17/17, #30 Last OV (CPE):  10/17/17 Next OV:  11/29/17

## 2017-11-29 ENCOUNTER — Ambulatory Visit (INDEPENDENT_AMBULATORY_CARE_PROVIDER_SITE_OTHER): Payer: Medicare Other | Admitting: Family Medicine

## 2017-11-29 ENCOUNTER — Encounter: Payer: Self-pay | Admitting: Family Medicine

## 2017-11-29 ENCOUNTER — Ambulatory Visit (INDEPENDENT_AMBULATORY_CARE_PROVIDER_SITE_OTHER)
Admission: RE | Admit: 2017-11-29 | Discharge: 2017-11-29 | Disposition: A | Discharge status: Home / Self Care | Payer: Medicare Other | Source: Ambulatory Visit | Attending: Family Medicine | Admitting: Family Medicine

## 2017-11-29 VITALS — BP 124/78 | HR 94 | Temp 98.8°F | Ht 70.0 in | Wt 258.5 lb

## 2017-11-29 DIAGNOSIS — Z9989 Dependence on other enabling machines and devices: Secondary | ICD-10-CM | POA: Diagnosis not present

## 2017-11-29 DIAGNOSIS — R519 Headache, unspecified: Secondary | ICD-10-CM

## 2017-11-29 DIAGNOSIS — M5030 Other cervical disc degeneration, unspecified cervical region: Secondary | ICD-10-CM | POA: Diagnosis not present

## 2017-11-29 DIAGNOSIS — R51 Headache: Secondary | ICD-10-CM

## 2017-11-29 DIAGNOSIS — G4733 Obstructive sleep apnea (adult) (pediatric): Secondary | ICD-10-CM | POA: Diagnosis not present

## 2017-11-29 DIAGNOSIS — M542 Cervicalgia: Secondary | ICD-10-CM | POA: Diagnosis not present

## 2017-11-29 NOTE — Progress Notes (Signed)
BP 124/78 (BP Location: Left Arm, Patient Position: Sitting, Cuff Size: Large)   Pulse 94   Temp 98.8 F (37.1 C) (Oral)   Ht '5\' 10"'  (1.778 m)   Wt 258 lb 8 oz (117.3 kg)   SpO2 94%   BMI 37.09 kg/m    CC: CPAP f/u visit Subjective:    Patient ID: [name], male    DOB: 1955-05-02, 62 y.o.   MRN: [id number]  HPI: [name] is a 62 y.o. male presenting on 11/29/2017 for Follow-up (Here for CPAP f/u.)   GF moving out - he is happy about this.   Recent dx OSA managing with sleep apnea. Using CPAP machine at least 4 hours - has not used recently due to recent car trip up [place]. Doesn't know CPAP settings. I am unable to review CPAP usage but pt is tolerating well and benefits from treatment. Uses full face mask. Trying to use 4 hours regularly. New puppy at home - chewed part of CPAP face mask.   Just got back from [place] trip (12 hour car ride). Did well with this.   2 mo h/o ongoing daily headache over R eye that comes and goes, worse with heavy lifting or straining, worse with swallowing. Ongoing R cervical neck pain. Treated with muscle relaxant and antibiotic course - both seemed to help but pain persists. No fevers, vision changes or significant sinus congestion. Headache doesn't wake him up from sleep. Worse as day progresses.  Lab Results  Component Value Date   ESRSEDRATE 13 10/17/2017   Relevant past medical, surgical, family and social history reviewed and updated as indicated. Interim medical history since our last visit reviewed. Allergies and medications reviewed and updated. Outpatient Medications Prior to Visit  Medication Sig Dispense Refill  . ACCU-CHEK FASTCLIX LANCETS MISC Check blood sugar once daily and as instructed. Dx 250.00 100 each 3  . aspirin EC 81 MG tablet Take 81 mg by mouth daily.    . Blood Glucose Monitoring Suppl (BLOOD GLUCOSE MONITOR SYSTEM) W/DEVICE KIT by Does not apply route. 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 citalopram (CELEXA) 10 MG tablet Take 1 tablet (10 mg total) by mouth daily. 30 tablet 11  . cyclobenzaprine (FLEXERIL) 5 MG tablet TAKE 1-2 TABLETS (5-10 MG TOTAL) BY MOUTH 2 (TWO) TIMES DAILY AS NEEDED FOR MUSCLE SPASMS. 30 tablet 3  . fluticasone (FLONASE) 50 MCG/ACT nasal spray SPRAY 2 SPRAYS INTO EACH NOSTRIL EVERY DAY 16 g 3  . furosemide (LASIX) 20 MG tablet TAKE 1 TABLET (20 MG TOTAL) BY MOUTH 2 (TWO) TIMES DAILY AS NEEDED. 90 tablet 3  . glimepiride (AMARYL) 2 MG tablet Take 1 tablet (2 mg total) by mouth daily with breakfast. 90 tablet 1  . hydrOXYzine (ATARAX/VISTARIL) 25 MG tablet TAKE 1 TABLET (25 MG TOTAL) BY MOUTH 2 (TWO) TIMES DAILY AS NEEDED FOR ANXIETY. 60 tablet 5  . metFORMIN (GLUCOPHAGE) 500 MG tablet TAKE 1 TABLET BY MOUTH EVERY MORNING AND TAKE 2 TABLETS BY MOUTH EVERY EVENING 270 tablet 1  . montelukast (SINGULAIR) 10 MG tablet TAKE 1 TABLET BY MOUTH EVERYDAY AT BEDTIME 90 tablet 3  . ONETOUCH VERIO test strip USE AS INSTRUCTED TO CHECK THREE TIMES DAILY AND AS NEEDED. E11.8 300 each 3  . pantoprazole (PROTONIX) 40 MG tablet Take 1 tablet (40 mg total) by mouth daily. 30 tablet 11  . potassium chloride (KLOR-CON  10) 10 MEQ tablet TAKE 1 TABLET (10 MEQ TOTAL) BY MOUTH DAILY. 90 tablet 0  . Probiotic Product (PROBIOTIC DAILY PO) Take by mouth.    . simvastatin (ZOCOR) 20 MG tablet TAKE 1 TABLET (20 MG TOTAL) BY MOUTH EVERY EVENING. 90 tablet 2  . tiotropium (SPIRIVA HANDIHALER) 18 MCG inhalation capsule Place 1 capsule (18 mcg total) into inhaler and inhale daily. 30 capsule 2   No facility-administered medications prior to visit.      Per HPI unless specifically indicated in ROS section below Review of Systems     Objective:    BP 124/78 (BP Location: Left Arm, Patient Position: Sitting, Cuff Size: Large)   Pulse 94   Temp 98.8 F (37.1 C) (Oral)   Ht '5\' 10"'  (1.778 m)   Wt 258 lb 8 oz (117.3 kg)   SpO2 94%   BMI 37.09 kg/m   Wt  Readings from Last 3 Encounters:  11/29/17 258 lb 8 oz (117.3 kg)  10/17/17 265 lb 4 oz (120.3 kg)  07/14/17 292 lb (132.5 kg)    Physical Exam  Constitutional: He appears well-developed and well-nourished. No distress.  HENT:  Mouth/Throat: Oropharynx is clear and moist. No oropharyngeal exudate.  Neck: Normal range of motion. Neck supple.  Cardiovascular: Normal rate, regular rhythm and normal heart sounds.  No murmur heard. Pulmonary/Chest: Effort normal and breath sounds normal. No respiratory distress. He has no wheezes. He has no rales.  Musculoskeletal: He exhibits no edema.  No midline cervical spine tenderness Marked R paracervical mm tenderness to palpation from base of neck to occiput - palpation of this area reproduces right frontal periorbital headache ROM at cervical neck limited by pain  Neurological: He is alert.  Psychiatric: He has a normal mood and affect.  Nursing note and vitals reviewed.  DG Cervical Spine Complete CLINICAL DATA:  Right-sided neck pain.  EXAM: CERVICAL SPINE - COMPLETE 4+ VIEW  COMPARISON:  None.  FINDINGS: No definite fracture is noted. Mild grade 1 anterolisthesis of C4-5 is noted. Severe degenerative disc disease is noted at C5-6 and C6-7 with anterior osteophyte formation. Moderate bilateral neural foraminal stenosis is noted at these levels secondary to uncovertebral spurring. Reversal of normal lordosis of cervical spine is noted most likely due to degenerative change. Degenerative changes seen involving the right-sided posterior facet joints of C2-3 and C3-4.  IMPRESSION: Severe degenerative disc disease is noted at C5-6 and C6-7 with moderate bilateral neural foraminal stenosis at these levels secondary to uncovertebral spurring. No acute abnormality is noted in the cervical spine.  Electronically Signed   By: Marijo Conception, M.D.   On: 11/30/2017 09:14   Lab Results  Component Value Date   ESRSEDRATE 13 10/17/2017    Lab Results  Component Value Date   WBC 12.8 (H) 10/17/2017   HGB 15.0 10/17/2017   HCT 44.3 10/17/2017   MCV 88.9 10/17/2017   PLT 253.0 10/17/2017    Lab Results  Component Value Date   HGBA1C 6.8 (H) 10/17/2017        Assessment & Plan:   Problem List Items Addressed This Visit    Severe obesity (BMI 35.0-39.9) with comorbidity ([place])    Another 7 lb weight loss in last 6 wks - pt trying.       Right-sided headache - Primary    Anticipate cervicogenic pain - check neck films today. Continue muscle relaxant PRN. Consider spine clinic referral. No red flags today. ESR normal last check.  Relevant Orders   DG Cervical Spine Complete (Completed)   OSA on CPAP    Will need new face mask. Using CPAP regularly, benefits from use.  Advised I could not download and review usage data - he may need to return to cards or pulm for this.       DDD (degenerative disc disease), cervical    Anticipate cervicogenic headache - palpation of right posterior neck reproduces R frontal pain. Check films today, continue muscle relaxant (has refills at pharmacy).           No orders of the defined types were placed in this encounter.  Orders Placed This Encounter  Procedures  . DG Cervical Spine Complete    Standing Status:   Future    Number of Occurrences:   1    Standing Expiration Date:   01/30/2019    Order Specific Question:   Reason for Exam (SYMPTOM  OR DIAGNOSIS REQUIRED)    Answer:   R neck pain    Order Specific Question:   Preferred imaging location?    Answer:   [facility]    Order Specific Question:   Radiology Contrast Protocol - do NOT remove file path    Answer:   [email or web address]    Follow up plan: Return if symptoms worsen or fail to improve.  [name], MD

## 2017-11-29 NOTE — Patient Instructions (Signed)
I think headache is coming from neck Cervical films today Refill muscle relaxant Heating pad to neck.  Gentle stretches of the neck.

## 2017-12-02 NOTE — Assessment & Plan Note (Signed)
Another 7 lb weight loss in last 6 wks - pt trying.

## 2017-12-02 NOTE — Assessment & Plan Note (Signed)
Anticipate cervicogenic headache - palpation of right posterior neck reproduces R frontal pain. Check films today, continue muscle relaxant (has refills at pharmacy).

## 2017-12-02 NOTE — Assessment & Plan Note (Signed)
Anticipate cervicogenic pain - check neck films today. Continue muscle relaxant PRN. Consider spine clinic referral. No red flags today. ESR normal last check.

## 2017-12-02 NOTE — Assessment & Plan Note (Signed)
Will need new face mask. Using CPAP regularly, benefits from use.  Advised I could not download and review usage data - he may need to return to cards or pulm for this.

## 2017-12-06 ENCOUNTER — Other Ambulatory Visit: Payer: Self-pay | Admitting: Family Medicine

## 2017-12-06 DIAGNOSIS — R51 Headache: Secondary | ICD-10-CM

## 2017-12-06 DIAGNOSIS — M5030 Other cervical disc degeneration, unspecified cervical region: Secondary | ICD-10-CM

## 2017-12-06 DIAGNOSIS — R519 Headache, unspecified: Secondary | ICD-10-CM

## 2017-12-09 ENCOUNTER — Other Ambulatory Visit: Payer: Self-pay

## 2017-12-09 MED ORDER — SIMVASTATIN 20 MG PO TABS: ORAL_TABLET | ORAL | 3 refills | Status: DC

## 2017-12-09 NOTE — Telephone Encounter (Signed)
Patient called and states he needs refill for Simvastatin. Patient took his last one last night and did not realize he did not have any more refills. Last fill was 02/10/18 for 90 tablets with 2 refills. Please review-Anvi Mangal V Achsah Mcquade, RMA

## 2017-12-11 DIAGNOSIS — G4733 Obstructive sleep apnea (adult) (pediatric): Secondary | ICD-10-CM | POA: Diagnosis not present

## 2017-12-22 ENCOUNTER — Telehealth: Payer: Self-pay

## 2017-12-22 NOTE — Telephone Encounter (Signed)
Raquel Sarna asked me to contact pt about appt [facility] made for 12/27/17 for head concerns. I spoke with pt; pt has not heard from appt from spine clinic yet. Pt was feeling better after taking muscle relaxant but now has sinus pain over rt eye, prod cough with white phlegm and slight wheeze but no problem breathing and very S/T (pt can swallow); no fever. pt scheduled appt to see Dr Darnell Level on 12/23/17 at 11:15. FYI to DR Danise Mina.

## 2017-12-23 ENCOUNTER — Ambulatory Visit (INDEPENDENT_AMBULATORY_CARE_PROVIDER_SITE_OTHER): Payer: Medicare Other | Admitting: Family Medicine

## 2017-12-23 ENCOUNTER — Encounter: Payer: Self-pay | Admitting: Family Medicine

## 2017-12-23 VITALS — BP 128/82 | HR 87 | Temp 98.3°F | Ht 70.0 in | Wt 251.0 lb

## 2017-12-23 DIAGNOSIS — J0110 Acute frontal sinusitis, unspecified: Secondary | ICD-10-CM

## 2017-12-23 DIAGNOSIS — R51 Headache: Secondary | ICD-10-CM

## 2017-12-23 DIAGNOSIS — R519 Headache, unspecified: Secondary | ICD-10-CM

## 2017-12-23 MED ORDER — AMOXICILLIN-POT CLAVULANATE 875-125 MG PO TABS: 1.0000 | ORAL_TABLET | Freq: Two times a day (BID) | ORAL | 0 refills | Status: AC

## 2017-12-23 NOTE — Assessment & Plan Note (Signed)
Ongoing, likely cervicogenic. Will await PM&R eval.

## 2017-12-23 NOTE — Progress Notes (Signed)
BP 128/82 (BP Location: Left Arm, Patient Position: Sitting, Cuff Size: Large)   Pulse 87   Temp 98.3 F (36.8 C) (Oral)   Ht '5\' 10"'  (1.778 m)   Wt 251 lb (113.9 kg)   SpO2 94%   BMI 36.01 kg/m   Orthostatic VS for the past 24 hrs (Last 3 readings):  BP- Lying BP- Standing at 0 minutes  12/23/17 1139 - 136/80  12/23/17 1138 140/88 -    CC: sinus congestion, dizziness Subjective:    Patient ID: [name], male    DOB: 17-Sep-1955, 62 y.o.   MRN: [id number]  HPI: [name] is a 62 y.o. male presenting on 12/23/2017 for Sinus Problem (C/o sinus pain, facial pain and drainage. Had a cough but seems to be getting better.) and Dizziness (C/o dizziness when walking and/or standing.)   Seen here earlier this month with R sided eye pain and headache for 2 months. At that time thought cervicogenic headache, treated with PRN muscle relaxant. xrays showed severe DDD C5/6 and C6/7 with mod B foraminal stenosis so referred to spine clinic - pending eval with Dr Dossie Arbour.   3d h/o ST, R throat and tooth and facial pain, then developed productive cough of white sputum. Has been treating with OTC sinus congestion medication as well as DM cough medicine which has helped. Has also used flonase. This has worsened R headache around eye. This morning when he woke up felt dizzy and disoriented, unsteady gait after shower. Decreased appetite. Hurts to chew. No vision changes. No fevers/chills. Eye pain intermittent for months.   Trouble using CPAP due to cough.  No recent abx use.  Recall cbgs 90s fasting! Lab Results  Component Value Date   HGBA1C 6.8 (H) 10/17/2017     Relevant past medical, surgical, family and social history reviewed and updated as indicated. Interim medical history since our last visit reviewed. Allergies and medications reviewed and updated. Outpatient Medications Prior to Visit  Medication Sig Dispense Refill  . ACCU-CHEK FASTCLIX LANCETS MISC Check blood sugar  once daily and as instructed. Dx 250.00 100 each 3  . aspirin EC 81 MG tablet Take 81 mg by mouth daily.    . Blood Glucose Monitoring Suppl (BLOOD GLUCOSE MONITOR SYSTEM) W/DEVICE KIT by Does not apply route. 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 citalopram (CELEXA) 10 MG tablet Take 1 tablet (10 mg total) by mouth daily. 30 tablet 11  . cyclobenzaprine (FLEXERIL) 5 MG tablet TAKE 1-2 TABLETS (5-10 MG TOTAL) BY MOUTH 2 (TWO) TIMES DAILY AS NEEDED FOR MUSCLE SPASMS. 30 tablet 3  . fluticasone (FLONASE) 50 MCG/ACT nasal spray SPRAY 2 SPRAYS INTO EACH NOSTRIL EVERY DAY 16 g 3  . furosemide (LASIX) 20 MG tablet TAKE 1 TABLET (20 MG TOTAL) BY MOUTH 2 (TWO) TIMES DAILY AS NEEDED. 90 tablet 3  . glimepiride (AMARYL) 2 MG tablet Take 1 tablet (2 mg total) by mouth daily with breakfast. 90 tablet 1  . hydrOXYzine (ATARAX/VISTARIL) 25 MG tablet TAKE 1 TABLET (25 MG TOTAL) BY MOUTH 2 (TWO) TIMES DAILY AS NEEDED FOR ANXIETY. 60 tablet 5  . metFORMIN (GLUCOPHAGE) 500 MG tablet TAKE 1 TABLET BY MOUTH EVERY MORNING AND TAKE 2 TABLETS BY MOUTH EVERY EVENING 270 tablet 1  . montelukast (SINGULAIR) 10 MG tablet TAKE 1 TABLET BY MOUTH EVERYDAY AT BEDTIME 90 tablet 3  . ONETOUCH VERIO test strip USE AS INSTRUCTED  TO CHECK THREE TIMES DAILY AND AS NEEDED. E11.8 300 each 3  . pantoprazole (PROTONIX) 40 MG tablet Take 1 tablet (40 mg total) by mouth daily. 30 tablet 11  . potassium chloride (KLOR-CON 10) 10 MEQ tablet TAKE 1 TABLET (10 MEQ TOTAL) BY MOUTH DAILY. 90 tablet 0  . Probiotic Product (PROBIOTIC DAILY PO) Take by mouth.    . simvastatin (ZOCOR) 20 MG tablet TAKE 1 TABLET (20 MG TOTAL) BY MOUTH EVERY EVENING. 90 tablet 3  . tiotropium (SPIRIVA HANDIHALER) 18 MCG inhalation capsule Place 1 capsule (18 mcg total) into inhaler and inhale daily. 30 capsule 2   No facility-administered medications prior to visit.      Per HPI unless specifically indicated in ROS section  below Review of Systems     Objective:    BP 128/82 (BP Location: Left Arm, Patient Position: Sitting, Cuff Size: Large)   Pulse 87   Temp 98.3 F (36.8 C) (Oral)   Ht '5\' 10"'  (1.778 m)   Wt 251 lb (113.9 kg)   SpO2 94%   BMI 36.01 kg/m   Wt Readings from Last 3 Encounters:  12/23/17 251 lb (113.9 kg)  11/29/17 258 lb 8 oz (117.3 kg)  10/17/17 265 lb 4 oz (120.3 kg)    Physical Exam  Constitutional: He appears well-developed and well-nourished. No distress.  HENT:  Head: Normocephalic and atraumatic.  Right Ear: Hearing, tympanic membrane, external ear and ear canal normal.  Left Ear: Hearing, tympanic membrane, external ear and ear canal normal.  Nose: Mucosal edema present. No rhinorrhea. Right sinus exhibits no maxillary sinus tenderness and no frontal sinus tenderness. Left sinus exhibits no maxillary sinus tenderness and no frontal sinus tenderness.  Mouth/Throat: Uvula is midline, oropharynx is clear and moist and mucous membranes are normal. No oropharyngeal exudate, posterior oropharyngeal edema, posterior oropharyngeal erythema or tonsillar abscesses.  No pain or nodularity at temporal region bilaterally  Eyes: Pupils are equal, round, and reactive to light. Conjunctivae and EOM are normal. No scleral icterus.  Neck: Normal range of motion. Neck supple.  Cardiovascular: Normal rate, regular rhythm, normal heart sounds and intact distal pulses.  No murmur heard. Pulmonary/Chest: Effort normal and breath sounds normal. No respiratory distress. He has no wheezes. He has no rales.  Lungs clear  Lymphadenopathy:    He has no cervical adenopathy.  Skin: Skin is warm and dry. No rash noted.  Nursing note and vitals reviewed.   Lab Results  Component Value Date   TSH 2.37 06/22/2016    Lab Results  Component Value Date   ESRSEDRATE 13 10/17/2017       Assessment & Plan:   Problem List Items Addressed This Visit    Right-sided headache    Ongoing, likely  cervicogenic. Will await PM&R eval.       Acute sinusitis - Primary    Recent symptoms more acute, however longstanding h/o R frontal headache /pain may have been simmering sinusitis - Rx 10d augmentin course. Push fluids and rest. Update if not improved with treatment, would consider return for ESR to further eval for GCA.       Relevant Medications   amoxicillin-clavulanate (AUGMENTIN) 875-125 MG tablet       Meds ordered this encounter  Medications  . amoxicillin-clavulanate (AUGMENTIN) 875-125 MG tablet    Sig: Take 1 tablet by mouth 2 (two) times daily for 10 days.    Dispense:  20 tablet    Refill:  0   No  orders of the defined types were placed in this encounter.   Follow up plan: Return if symptoms worsen or fail to improve.  [name], MD

## 2017-12-23 NOTE — Patient Instructions (Addendum)
You may have sinus infection - treat with augmentin course for 10 days.  Continue flonase, caution with decongestants.  Continue DM cough medicine.  Push fluids and rest.  Let us know if persistent R eye pain/headache despite finishing treatment.

## 2017-12-23 NOTE — Assessment & Plan Note (Signed)
Recent symptoms more acute, however longstanding h/o R frontal headache /pain may have been simmering sinusitis - Rx 10d augmentin course. Push fluids and rest. Update if not improved with treatment, would consider return for ESR to further eval for GCA.

## 2017-12-27 ENCOUNTER — Ambulatory Visit: Payer: Medicare Other | Admitting: Family Medicine

## 2018-01-05 ENCOUNTER — Other Ambulatory Visit: Payer: Self-pay

## 2018-01-05 ENCOUNTER — Ambulatory Visit: Payer: Medicare Other | Attending: Nurse Practitioner | Admitting: Nurse Practitioner

## 2018-01-05 ENCOUNTER — Encounter: Payer: Self-pay | Admitting: Nurse Practitioner

## 2018-01-05 DIAGNOSIS — H5711 Ocular pain, right eye: Secondary | ICD-10-CM | POA: Diagnosis not present

## 2018-01-05 DIAGNOSIS — I5022 Chronic systolic (congestive) heart failure: Secondary | ICD-10-CM | POA: Diagnosis not present

## 2018-01-05 DIAGNOSIS — R519 Headache, unspecified: Secondary | ICD-10-CM | POA: Insufficient documentation

## 2018-01-05 DIAGNOSIS — R51 Headache: Secondary | ICD-10-CM

## 2018-01-05 DIAGNOSIS — I2510 Atherosclerotic heart disease of native coronary artery without angina pectoris: Secondary | ICD-10-CM | POA: Diagnosis not present

## 2018-01-05 DIAGNOSIS — M545 Low back pain: Secondary | ICD-10-CM | POA: Diagnosis not present

## 2018-01-05 DIAGNOSIS — M79602 Pain in left arm: Secondary | ICD-10-CM

## 2018-01-05 DIAGNOSIS — J449 Chronic obstructive pulmonary disease, unspecified: Secondary | ICD-10-CM | POA: Diagnosis not present

## 2018-01-05 DIAGNOSIS — Z789 Other specified health status: Secondary | ICD-10-CM

## 2018-01-05 DIAGNOSIS — E1165 Type 2 diabetes mellitus with hyperglycemia: Secondary | ICD-10-CM | POA: Insufficient documentation

## 2018-01-05 DIAGNOSIS — Z79899 Other long term (current) drug therapy: Secondary | ICD-10-CM

## 2018-01-05 DIAGNOSIS — E782 Mixed hyperlipidemia: Secondary | ICD-10-CM | POA: Insufficient documentation

## 2018-01-05 DIAGNOSIS — M899 Disorder of bone, unspecified: Secondary | ICD-10-CM

## 2018-01-05 DIAGNOSIS — G894 Chronic pain syndrome: Secondary | ICD-10-CM | POA: Diagnosis not present

## 2018-01-05 DIAGNOSIS — I110 Hypertensive heart disease with heart failure: Secondary | ICD-10-CM | POA: Insufficient documentation

## 2018-01-05 DIAGNOSIS — G8929 Other chronic pain: Secondary | ICD-10-CM | POA: Insufficient documentation

## 2018-01-05 DIAGNOSIS — M549 Dorsalgia, unspecified: Secondary | ICD-10-CM | POA: Diagnosis not present

## 2018-01-05 DIAGNOSIS — I714 Abdominal aortic aneurysm, without rupture: Secondary | ICD-10-CM | POA: Insufficient documentation

## 2018-01-05 DIAGNOSIS — M79601 Pain in right arm: Secondary | ICD-10-CM

## 2018-01-05 DIAGNOSIS — M542 Cervicalgia: Secondary | ICD-10-CM

## 2018-01-05 DIAGNOSIS — M79603 Pain in arm, unspecified: Secondary | ICD-10-CM

## 2018-01-05 DIAGNOSIS — E785 Hyperlipidemia, unspecified: Secondary | ICD-10-CM | POA: Diagnosis not present

## 2018-01-05 NOTE — Progress Notes (Signed)
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 Unimpaired Palpation: Non-tender       Provocative Tests: Lumbar Hyperextension and rotation test: Positive bilaterally for facet joint pain. Patrick's Maneuver: Negative                    Gait & Posture Assessment  Ambulation: Unassisted Gait: Relatively normal for age and body habitus Posture: WNL   Lower Extremity Exam    Side: Right lower extremity  Side: Left lower extremity  Inspection: No masses, redness, swelling, or asymmetry. No contractures  Inspection: No masses, redness, swelling, or asymmetry. No contractures  Functional ROM: Unrestricted ROM          Functional ROM: Unrestricted ROM          Muscle strength & Tone: Able to Toe-walk & Heel-walk without problems  Muscle strength & Tone: Able to Toe-walk & Heel-walk without problems  Sensory: Unimpaired  Sensory: Unimpaired  Palpation: No palpable anomalies  Palpation: No palpable anomalies   Assessment  Primary Diagnosis & Pertinent Problem List: Diagnoses of Pain in right eye (Primary Area of Pain), Right sided facial pain (Secondary Area of Pain), Chronic neck pain, Chronic pain of both upper  extremities, Chronic upper back pain, Chronic bilateral low back pain without sciatica, Chronic pain syndrome, Pharmacologic therapy, Disorder of skeletal system, and Problems influencing health status were pertinent to this visit.  Visit Diagnosis: 1. Pain in right eye (Primary Area of Pain)   2. Right sided facial pain (Secondary Area of Pain)   3. Chronic neck pain   4. Chronic pain  of both upper extremities   5. Chronic upper back pain   6. Chronic bilateral low back pain without sciatica   7. Chronic pain syndrome   8. Pharmacologic therapy   9. Disorder of skeletal system   10. Problems influencing health status    Plan of Care  Initial treatment plan:  Please be advised that as per protocol, today's visit has been an evaluation only. We have not taken over the patient's controlled substance management.  Problem-specific plan: No problem-specific Assessment & Plan notes found for this encounter.  Ordered Lab-work, Procedure(s), Referral(s), & Consult(s): Orders Placed This Encounter  Procedures  . DG Lumbar Spine Complete W/Bend  . DG Thoracic Spine 2 View  . Compliance Drug Analysis, Ur  . Comp. Metabolic Panel (12)  . Magnesium  . Vitamin B12  . Sedimentation rate  . 25-Hydroxyvitamin D Lcms D2+D3  . C-reactive protein   Pharmacotherapy: Medications ordered:  No orders of the defined types were placed in this encounter.  Medications administered during this visit: [name] had no medications administered during this visit.   Pharmacotherapy under consideration:  Opioid Analgesics: The patient was informed that there is no guarantee that he would be a candidate for opioid analgesics. The decision will be made following CDC guidelines. This decision will be based on the results of diagnostic studies, as well as Mr. Merfeld risk profile.  Membrane stabilizer: To be determined at a later time Muscle relaxant: To be determined at a later time NSAID: To be  determined at a later time Other analgesic(s): To be determined at a later time   Interventional therapies under consideration: Mr. Hoeffner was informed that there is no guarantee that he would be a candidate for interventional therapies. The decision will be based on the results of diagnostic studies, as well as Mr. Churchill risk profile.  Possible procedure(s): Consider MRI of head Diagnostic greater occipital nerve block Diagnostic bilateral cervical facet nerve block Possible bilateral cervical facet radiofrequency ablation Diagnostic bilateral transforaminal cervical epidural steroid injection Diagnostic midline thoracic nerve block Possible midline thoracic radiofrequency ablation Diagnostic bilateral lumbar facet nerve block Possible bilateral lumbar facet radiofrequency ablation   Provider-requested follow-up: Return for 2nd Visit, w/ Dr. Dossie Arbour.  Future Appointments  Date Time Provider [place]  01/25/2018 10:30 AM Milinda Pointer, MD [facility] None    Primary Care Physician: [name], MD Location: [facility] Facility Note by:  Date: 01/05/2018; Time: 1:12 PM  Pain Score Disclaimer: We use the NRS-11 scale. This is a self-reported, subjective measurement of pain severity with only modest accuracy. It is used primarily to identify changes within a particular patient. It must be understood that outpatient pain scales are significantly less accurate that those used for research, where they can be applied under ideal controlled circumstances with minimal exposure to variables. In reality, the score is likely to be a combination of pain intensity and pain affect, where pain affect describes the degree of emotional arousal or changes in action readiness caused by the sensory experience of pain. Factors such as social and work situation, setting, emotional state, anxiety levels, expectation, and prior pain experience may influence pain  perception and show large inter-individual differences that may also be affected by time variables.  Patient instructions provided during this appointment: Patient Instructions   ____________________________________________________________________________________________  Appointment Policy Summary  It is our goal and responsibility to provide the medical community with assistance in the evaluation and management of patients with chronic pain. Unfortunately  our resources are limited. Because we do not have an unlimited amount of time, or available appointments, we are required to closely monitor and manage their use. The following rules exist to maximize their use:  Patient's responsibilities: 1. Punctuality:  At what time should I arrive? You should be physically present in our office 30 minutes before your scheduled appointment. Your scheduled appointment is with your assigned healthcare provider. However, it takes 5-10 minutes to be "checked-in", and another 15 minutes for the nurses to do the admission. If you arrive to our office at the time you were given for your appointment, you will end up being at least 20-25 minutes late to your appointment with the provider. 2. Tardiness:  What happens if I arrive only a few minutes after my scheduled appointment time? You will need to reschedule your appointment. The cutoff is your appointment time. This is why it is so important that you arrive at least 30 minutes before that appointment. If you have an appointment scheduled for 10:00 AM and you arrive at 10:01, you will be required to reschedule your appointment.  3. Plan ahead:  Always assume that you will encounter traffic on your way in. Plan for it. If you are dependent on a driver, make sure they understand these rules and the need to arrive early. 4. Other appointments and responsibilities:  Avoid scheduling any other appointments before or after your pain clinic appointments.  5. Be  prepared:  Write down everything that you need to discuss with your healthcare provider and give this information to the admitting nurse. Write down the medications that you will need refilled. Bring your pills and bottles (even the empty ones), to all of your appointments, except for those where a procedure is scheduled. 6. No children or pets:  Find someone to take care of them. It is not appropriate to bring them in. 7. Scheduling changes:  We request "advanced notification" of any changes or cancellations. 8. Advanced notification:  Defined as a time period of more than 24 hours prior to the originally scheduled appointment. This allows for the appointment to be offered to other patients. 9. Rescheduling:  When a visit is rescheduled, it will require the cancellation of the original appointment. For this reason they both fall within the category of "Cancellations".  10. Cancellations:  They require advanced notification. Any cancellation less than 24 hours before the  appointment will be recorded as a "No Show". 11. No Show:  Defined as an unkept appointment where the patient failed to notify or declare to the practice their intention or inability to keep the appointment.  Corrective process for repeat offenders:  1. Tardiness: Three (3) episodes of rescheduling due to late arrivals will be recorded as one (1) "No Show". 2. Cancellation or reschedule: Three (3) cancellations or rescheduling will be recorded as one (1) "No Show". 3. "No Shows": Three (3) "No Shows" within a 12 month period will result in discharge from the practice. ____________________________________________________________________________________________  ____________________________________________________________________________________________  Pain Scale  Introduction: The pain score used by this practice is the Verbal Numerical Rating Scale (VNRS-11). This is an 11-point scale. It is for adults and children 10  years or older. There are significant differences in how the pain score is reported, used, and applied. Forget everything you learned in the past and learn this scoring system.  General Information: The scale should reflect your current level of pain. Unless you are specifically asked for the level of your worst pain, or your  average pain. If you are asked for one of these two, then it should be understood that it is over the past 24 hours.  Basic Activities of Daily Living (ADL): Personal hygiene, dressing, eating, transferring, and using restroom.  Instructions: Most patients tend to report their level of pain as a combination of two factors, their physical pain and their psychosocial pain. This last one is also known as "suffering" and it is reflection of how physical pain affects you socially and psychologically. From now on, report them separately. From this point on, when asked to report your pain level, report only your physical pain. Use the following table for reference.  Pain Clinic Pain Levels (0-5/10)  Pain Level Score  Description  No Pain 0   Mild pain 1 Nagging, annoying, but does not interfere with basic activities of daily living (ADL). Patients are able to eat, bathe, get dressed, toileting (being able to get on and off the toilet and perform personal hygiene functions), transfer (move in and out of bed or a chair without assistance), and maintain continence (able to control bladder and bowel functions). Blood pressure and heart rate are unaffected. A normal heart rate for a healthy adult ranges from 60 to 100 bpm (beats per minute).   Mild to moderate pain 2 Noticeable and distracting. Impossible to hide from other people. More frequent flare-ups. Still possible to adapt and function close to normal. It can be very annoying and may have occasional stronger flare-ups. With discipline, patients may get used to it and adapt.   Moderate pain 3 Interferes significantly with activities of  daily living (ADL). It becomes difficult to feed, bathe, get dressed, get on and off the toilet or to perform personal hygiene functions. Difficult to get in and out of bed or a chair without assistance. Very distracting. With effort, it can be ignored when deeply involved in activities.   Moderately severe pain 4 Impossible to ignore for more than a few minutes. With effort, patients may still be able to manage work or participate in some social activities. Very difficult to concentrate. Signs of autonomic nervous system discharge are evident: dilated pupils (mydriasis); mild sweating (diaphoresis); sleep interference. Heart rate becomes elevated (>115 bpm). Diastolic blood pressure (lower number) rises above 100 mmHg. Patients find relief in laying down and not moving.   Severe pain 5 Intense and extremely unpleasant. Associated with frowning face and frequent crying. Pain overwhelms the senses.  Ability to do any activity or maintain social relationships becomes significantly limited. Conversation becomes difficult. Pacing back and forth is common, as getting into a comfortable position is nearly impossible. Pain wakes you up from deep sleep. Physical signs will be obvious: pupillary dilation; increased sweating; goosebumps; brisk reflexes; cold, clammy hands and feet; nausea, vomiting or dry heaves; loss of appetite; significant sleep disturbance with inability to fall asleep or to remain asleep. When persistent, significant weight loss is observed due to the complete loss of appetite and sleep deprivation.  Blood pressure and heart rate becomes significantly elevated. Caution: If elevated blood pressure triggers a pounding headache associated with blurred vision, then the patient should immediately seek attention at an urgent or emergency care unit, as these may be signs of an impending stroke.    Emergency Department Pain Levels (6-10/10)  Emergency Room Pain 6 Severely limiting. Requires emergency  care and should not be seen or managed at an outpatient pain management facility. Communication becomes difficult and requires great effort. Assistance to reach the emergency  department may be required. Facial flushing and profuse sweating along with potentially dangerous increases in heart rate and blood pressure will be evident.   Distressing pain 7 Self-care is very difficult. Assistance is required to transport, or use restroom. Assistance to reach the emergency department will be required. Tasks requiring coordination, such as bathing and getting dressed become very difficult.   Disabling pain 8 Self-care is no longer possible. At this level, pain is disabling. The individual is unable to do even the most "basic" activities such as walking, eating, bathing, dressing, transferring to a bed, or toileting. Fine motor skills are lost. It is difficult to think clearly.   Incapacitating pain 9 Pain becomes incapacitating. Thought processing is no longer possible. Difficult to remember your own name. Control of movement and coordination are lost.   The worst pain imaginable 10 At this level, most patients pass out from pain. When this level is reached, collapse of the autonomic nervous system occurs, leading to a sudden drop in blood pressure and heart rate. This in turn results in a temporary and dramatic drop in blood flow to the brain, leading to a loss of consciousness. Fainting is one of the body's self defense mechanisms. Passing out puts the brain in a calmed state and causes it to shut down for a while, in order to begin the healing process.    Summary: 1. Refer to this scale when providing [place] with your pain level. 2. Be accurate and careful when reporting your pain level. This will help with your care. 3. Over-reporting your pain level will lead to loss of credibility. 4. Even a level of 1/10 means that there is pain and will be treated at our facility. 5. High, inaccurate reporting will be  documented as "Symptom Exaggeration", leading to loss of credibility and suspicions of possible secondary gains such as obtaining more narcotics, or wanting to appear disabled, for fraudulent reasons. 6. Only pain levels of 5 or below will be seen at our facility. 7. Pain levels of 6 and above will be sent to the Emergency Department and the appointment cancelled. ____________________________________________________________________________________________    BMI Assessment: Estimated body mass index is 34.87 kg/m as calculated from the following:   Height as of this encounter: _0  (1.803 m).   Weight as of this encounter: 250 lb (113.4 kg).  BMI interpretation table: BMI level Category Range association with higher incidence of chronic pain  <18 kg/m2 Underweight   18.5-24.9 kg/m2 Ideal body weight   25-29.9 kg/m2 Overweight Increased incidence by 20%  30-34.9 kg/m2 Obese (Class I) Increased incidence by 68%  35-39.9 kg/m2 Severe obesity (Class II) Increased incidence by 136%  >40 kg/m2 Extreme obesity (Class III) Increased incidence by 254%   BMI Readings from Last 4 Encounters:  01/05/18 34.87 kg/m  12/23/17 36.01 kg/m  11/29/17 37.09 kg/m  10/17/17 38.61 kg/m   Wt Readings from Last 4 Encounters:  01/05/18 250 lb (113.4 kg)  12/23/17 251 lb (113.9 kg)  11/29/17 258 lb 8 oz (117.3 kg)  10/17/17 265 lb 4 oz (120.3 kg)

## 2018-01-05 NOTE — Patient Instructions (Addendum)
____________________________________________________________________________________________  Appointment Policy Summary  It is our goal and responsibility to provide the medical community with assistance in the evaluation and management of patients with chronic pain. Unfortunately our resources are limited. Because we do not have an unlimited amount of time, or available appointments, we are required to closely monitor and manage their use. The following rules exist to maximize their use:  Patient's responsibilities: 1. Punctuality:  At what time should I arrive? You should be physically present in our office 30 minutes before your scheduled appointment. Your scheduled appointment is with your assigned healthcare provider. However, it takes 5-10 minutes to be "checked-in", and another 15 minutes for the nurses to do the admission. If you arrive to our office at the time you were given for your appointment, you will end up being at least 20-25 minutes late to your appointment with the provider. 2. Tardiness:  What happens if I arrive only a few minutes after my scheduled appointment time? You will need to reschedule your appointment. The cutoff is your appointment time. This is why it is so important that you arrive at least 30 minutes before that appointment. If you have an appointment scheduled for 10:00 AM and you arrive at 10:01, you will be required to reschedule your appointment.  3. Plan ahead:  Always assume that you will encounter traffic on your way in. Plan for it. If you are dependent on a driver, make sure they understand these rules and the need to arrive early. 4. Other appointments and responsibilities:  Avoid scheduling any other appointments before or after your pain clinic appointments.  5. Be prepared:  Write down everything that you need to discuss with your healthcare provider and give this information to the admitting nurse. Write down the medications that you will need  refilled. Bring your pills and bottles (even the empty ones), to all of your appointments, except for those where a procedure is scheduled. 6. No children or pets:  Find someone to take care of them. It is not appropriate to bring them in. 7. Scheduling changes:  We request "advanced notification" of any changes or cancellations. 8. Advanced notification:  Defined as a time period of more than 24 hours prior to the originally scheduled appointment. This allows for the appointment to be offered to other patients. 9. Rescheduling:  When a visit is rescheduled, it will require the cancellation of the original appointment. For this reason they both fall within the category of "Cancellations".  10. Cancellations:  They require advanced notification. Any cancellation less than 24 hours before the  appointment will be recorded as a "No Show". 11. No Show:  Defined as an unkept appointment where the patient failed to notify or declare to the practice their intention or inability to keep the appointment.  Corrective process for repeat offenders:  1. Tardiness: Three (3) episodes of rescheduling due to late arrivals will be recorded as one (1) "No Show". 2. Cancellation or reschedule: Three (3) cancellations or rescheduling will be recorded as one (1) "No Show". 3. "No Shows": Three (3) "No Shows" within a 12 month period will result in discharge from the practice. ____________________________________________________________________________________________  ____________________________________________________________________________________________  Pain Scale  Introduction: The pain score used by this practice is the Verbal Numerical Rating Scale (VNRS-11). This is an 11-point scale. It is for adults and children 10 years or older. There are significant differences in how the pain score is reported, used, and applied. Forget everything you learned in the past and learn  this scoring system.  General  Information: The scale should reflect your current level of pain. Unless you are specifically asked for the level of your worst pain, or your average pain. If you are asked for one of these two, then it should be understood that it is over the past 24 hours.  Basic Activities of Daily Living (ADL): Personal hygiene, dressing, eating, transferring, and using restroom.  Instructions: Most patients tend to report their level of pain as a combination of two factors, their physical pain and their psychosocial pain. This last one is also known as "suffering" and it is reflection of how physical pain affects you socially and psychologically. From now on, report them separately. From this point on, when asked to report your pain level, report only your physical pain. Use the following table for reference.  Pain Clinic Pain Levels (0-5/10)  Pain Level Score  Description  No Pain 0   Mild pain 1 Nagging, annoying, but does not interfere with basic activities of daily living (ADL). Patients are able to eat, bathe, get dressed, toileting (being able to get on and off the toilet and perform personal hygiene functions), transfer (move in and out of bed or a chair without assistance), and maintain continence (able to control bladder and bowel functions). Blood pressure and heart rate are unaffected. A normal heart rate for a healthy adult ranges from 60 to 100 bpm (beats per minute).   Mild to moderate pain 2 Noticeable and distracting. Impossible to hide from other people. More frequent flare-ups. Still possible to adapt and function close to normal. It can be very annoying and may have occasional stronger flare-ups. With discipline, patients may get used to it and adapt.   Moderate pain 3 Interferes significantly with activities of daily living (ADL). It becomes difficult to feed, bathe, get dressed, get on and off the toilet or to perform personal hygiene functions. Difficult to get in and out of bed or a chair  without assistance. Very distracting. With effort, it can be ignored when deeply involved in activities.   Moderately severe pain 4 Impossible to ignore for more than a few minutes. With effort, patients may still be able to manage work or participate in some social activities. Very difficult to concentrate. Signs of autonomic nervous system discharge are evident: dilated pupils (mydriasis); mild sweating (diaphoresis); sleep interference. Heart rate becomes elevated (>115 bpm). Diastolic blood pressure (lower number) rises above 100 mmHg. Patients find relief in laying down and not moving.   Severe pain 5 Intense and extremely unpleasant. Associated with frowning face and frequent crying. Pain overwhelms the senses.  Ability to do any activity or maintain social relationships becomes significantly limited. Conversation becomes difficult. Pacing back and forth is common, as getting into a comfortable position is nearly impossible. Pain wakes you up from deep sleep. Physical signs will be obvious: pupillary dilation; increased sweating; goosebumps; brisk reflexes; cold, clammy hands and feet; nausea, vomiting or dry heaves; loss of appetite; significant sleep disturbance with inability to fall asleep or to remain asleep. When persistent, significant weight loss is observed due to the complete loss of appetite and sleep deprivation.  Blood pressure and heart rate becomes significantly elevated. Caution: If elevated blood pressure triggers a pounding headache associated with blurred vision, then the patient should immediately seek attention at an urgent or emergency care unit, as these may be signs of an impending stroke.    Emergency Department Pain Levels (6-10/10)  Emergency Room Pain 6 Severely   limiting. Requires emergency care and should not be seen or managed at an outpatient pain management facility. Communication becomes difficult and requires great effort. Assistance to reach the emergency department  may be required. Facial flushing and profuse sweating along with potentially dangerous increases in heart rate and blood pressure will be evident.   Distressing pain 7 Self-care is very difficult. Assistance is required to transport, or use restroom. Assistance to reach the emergency department will be required. Tasks requiring coordination, such as bathing and getting dressed become very difficult.   Disabling pain 8 Self-care is no longer possible. At this level, pain is disabling. The individual is unable to do even the most "basic" activities such as walking, eating, bathing, dressing, transferring to a bed, or toileting. Fine motor skills are lost. It is difficult to think clearly.   Incapacitating pain 9 Pain becomes incapacitating. Thought processing is no longer possible. Difficult to remember your own name. Control of movement and coordination are lost.   The worst pain imaginable 10 At this level, most patients pass out from pain. When this level is reached, collapse of the autonomic nervous system occurs, leading to a sudden drop in blood pressure and heart rate. This in turn results in a temporary and dramatic drop in blood flow to the brain, leading to a loss of consciousness. Fainting is one of the body's self defense mechanisms. Passing out puts the brain in a calmed state and causes it to shut down for a while, in order to begin the healing process.    Summary: 1. Refer to this scale when providing [place] with your pain level. 2. Be accurate and careful when reporting your pain level. This will help with your care. 3. Over-reporting your pain level will lead to loss of credibility. 4. Even a level of 1/10 means that there is pain and will be treated at our facility. 5. High, inaccurate reporting will be documented as "Symptom Exaggeration", leading to loss of credibility and suspicions of possible secondary gains such as obtaining more narcotics, or wanting to appear disabled, for  fraudulent reasons. 6. Only pain levels of 5 or below will be seen at our facility. 7. Pain levels of 6 and above will be sent to the Emergency Department and the appointment cancelled. ____________________________________________________________________________________________    BMI Assessment: Estimated body mass index is 34.87 kg/m as calculated from the following:   Height as of this encounter: 5\' 11"  (1.803 m).   Weight as of this encounter: 250 lb (113.4 kg).  BMI interpretation table: BMI level Category Range association with higher incidence of chronic pain  <18 kg/m2 Underweight   18.5-24.9 kg/m2 Ideal body weight   25-29.9 kg/m2 Overweight Increased incidence by 20%  30-34.9 kg/m2 Obese (Class I) Increased incidence by 68%  35-39.9 kg/m2 Severe obesity (Class II) Increased incidence by 136%  >40 kg/m2 Extreme obesity (Class III) Increased incidence by 254%   BMI Readings from Last 4 Encounters:  01/05/18 34.87 kg/m  12/23/17 36.01 kg/m  11/29/17 37.09 kg/m  10/17/17 38.61 kg/m   Wt Readings from Last 4 Encounters:  01/05/18 250 lb (113.4 kg)  12/23/17 251 lb (113.9 kg)  11/29/17 258 lb 8 oz (117.3 kg)  10/17/17 265 lb 4 oz (120.3 kg)

## 2018-01-09 LAB — COMP. METABOLIC PANEL (12)
A/G RATIO: 2 (ref 1.2–2.2)
AST: 17 IU/L (ref 0–40)
Albumin: 4.6 g/dL (ref 3.6–4.8)
Alkaline Phosphatase: 65 IU/L (ref 39–117)
BILIRUBIN TOTAL: 0.2 mg/dL (ref 0.0–1.2)
BUN/Creatinine Ratio: 12 (ref 10–24)
BUN: 7 mg/dL — ABNORMAL LOW (ref 8–27)
CALCIUM: 9.9 mg/dL (ref 8.6–10.2)
CHLORIDE: 97 mmol/L (ref 96–106)
Creatinine, Ser: 0.59 mg/dL — ABNORMAL LOW (ref 0.76–1.27)
GFR calc Af Amer: 125 mL/min/{1.73_m2} (ref >59)
GFR calc non Af Amer: 109 mL/min/{1.73_m2} (ref >59)
Globulin, Total: 2.3 g/dL (ref 1.5–4.5)
Glucose: 77 mg/dL (ref 65–99)
Potassium: 4.6 mmol/L (ref 3.5–5.2)
Sodium: 142 mmol/L (ref 134–144)
Total Protein: 6.9 g/dL (ref 6.0–8.5)

## 2018-01-09 LAB — MAGNESIUM: MAGNESIUM: 1.8 mg/dL (ref 1.6–2.3)

## 2018-01-09 LAB — C-REACTIVE PROTEIN: CRP: 9 mg/L (ref 0–10)

## 2018-01-09 LAB — 25-HYDROXY VITAMIN D LCMS D2+D3
25-Hydroxy, Vitamin D-2: <1 ng/mL
25-Hydroxy, Vitamin D-3: 25 ng/mL
25-Hydroxy, Vitamin D: 25 ng/mL — ABNORMAL LOW

## 2018-01-09 LAB — SEDIMENTATION RATE: SED RATE: 33 mm/h — AB (ref 0–30)

## 2018-01-09 LAB — VITAMIN B12: VITAMIN B 12: 354 pg/mL (ref 232–1245)

## 2018-01-11 DIAGNOSIS — G4733 Obstructive sleep apnea (adult) (pediatric): Secondary | ICD-10-CM | POA: Diagnosis not present

## 2018-01-11 LAB — COMPLIANCE DRUG ANALYSIS, UR

## 2018-01-18 ENCOUNTER — Telehealth: Payer: Self-pay | Admitting: Family Medicine

## 2018-01-18 NOTE — Telephone Encounter (Signed)
I'm not seeing where rx was changed. Did you instruct pt to take 2 tabs BID?

## 2018-01-18 NOTE — Telephone Encounter (Signed)
Copied from [place] [phone]. Topic: Quick Communication - See Telephone Encounter >> Jan 18, 2018  4:19 PM Mylinda Latina, NT wrote: CRM for notification. See Telephone encounter for: 01/18/18. [name] calling from [facility] states the patient stated he currently take metFORMIN (GLUCOPHAGE) 500 MG tablet  2 tabs in the am and 2 tabs in the PM. His RX states that he takes 1 tab in the am and 2 tab in the pM .Please clarify and send in a new RX.  [id number] [name], [place] [phone] (Phone) [phone] (Fax)

## 2018-01-19 MED ORDER — METFORMIN HCL 1000 MG PO TABS: 1000.0000 mg | ORAL_TABLET | Freq: Two times a day (BID) | ORAL | 1 refills | Status: DC

## 2018-01-19 NOTE — Telephone Encounter (Signed)
Will refill how patient has been taking - will refill at 1041mcg bid. A1c at goal. Lab Results  Component Value Date   CREATININE 0.59 (L) 01/05/2018

## 2018-01-20 DIAGNOSIS — J42 Unspecified chronic bronchitis: Secondary | ICD-10-CM | POA: Diagnosis not present

## 2018-01-20 DIAGNOSIS — G4733 Obstructive sleep apnea (adult) (pediatric): Secondary | ICD-10-CM | POA: Diagnosis not present

## 2018-01-24 NOTE — Progress Notes (Deleted)
Patient's Name: [name]  MRN: [id number]  Referring Provider: [name], MD  DOB: [birth year]  PCP: [name], MD  DOS: 01/25/2018  Note by: Gaspar Cola, MD  Service setting: Ambulatory outpatient  Specialty: Interventional Pain Management  Location: [facility] (AMB) Pain Management Facility    Patient type: Established   Primary Reason(s) for Visit: Encounter for evaluation before starting new chronic pain management plan of care (Level of risk: moderate) CC: No chief complaint on file.  HPI  [name] is a 62 y.o. year old, male patient, who comes today for a follow-up evaluation to review the test results and decide on a treatment plan. He has Ex-smoker; Sleep apnea, central; Allergic rhinitis; DDD (degenerative disc disease), cervical; Atrial fibrillation and flutter ([place]); Morbid obesity ([place]); Hypertension; Mixed hyperlipidemia; Panic attacks; Coronary artery disease; Type II diabetes mellitus ([place]); Chest pain; Malaise and fatigue; Exertional dyspnea; Medicare annual wellness visit, subsequent; Umbilical hernia without obstruction and without gangrene; Left flank mass; Abnormal toxicological findings; Advanced care planning/counseling discussion; Health maintenance examination; Acute sinusitis; Gassiness; Tinea pedis; Chronic systolic CHF (congestive heart failure) ([place]); Restrictive lung disease; Right flank pain; 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[place]); Right shoulder pain; Fatty liver; Adrenal adenoma, left; Nodule of right lung; Aortic dilatation ([place]); Right leg weakness; Right-sided headache; AAA (abdominal aortic aneurysm) without rupture (HCC); CHF (congestive heart failure) ([place]); Other dyspnea and respiratory abnormality; SOB (shortness of breath); Pain in right eye (Primary Area of Pain); Right sided facial pain (Secondary Area of Pain); Chronic neck pain (Tertiary Area of Pain) (R>L); Chronic upper extremity pain (Fourth Area of Pain) (R>L); Chronic upper back pain;  Chronic bilateral low back pain without sciatica; Chronic pain syndrome; Pharmacologic therapy; Disorder of skeletal system; and Problems influencing health status on their problem list. His primarily concern today is the No chief complaint on file.  Pain Assessment: Location:     Radiating:   Onset:   Duration:   Quality:   Severity:  /10 (subjective, self-reported pain score)  Note: Reported level is compatible with observation.                         When using our objective Pain Scale, levels between 6 and 10/10 are said to belong in an emergency room, as it progressively worsens from a 6/10, described as severely limiting, requiring emergency care not usually available at an outpatient pain management facility. At a 6/10 level, communication becomes difficult and requires great effort. Assistance to reach the emergency department may be required. Facial flushing and profuse sweating along with potentially dangerous increases in heart rate and blood pressure will be evident. Effect on ADL:   Timing:   Modifying factors:   BP:    HR:    [name] comes in today for a follow-up visit after his initial evaluation on 01/05/2018. Today we went over the results of his tests. These were explained in "Layman's terms". During today's appointment we went over my diagnostic impression, as well as the proposed treatment plan.  ***  In considering the treatment plan options, [name] was reminded that I no longer take patients for medication management only. I asked him to let me know if he had no intention of taking advantage of the interventional therapies, so that we could make arrangements to provide this space to someone interested. I also made it clear that undergoing interventional therapies for the purpose of getting pain medications is very inappropriate on the part  of a patient, and it will not be tolerated in this practice. This type of behavior would suggest true addiction and therefore  it requires referral to an addiction specialist.   Further details on both, my assessment(s), as well as the proposed treatment plan, please see below.  Controlled Substance Pharmacotherapy Assessment REMS (Risk Evaluation and Mitigation Strategy)  Analgesic: ***  MME/day: *** mg/day. Pill Count: None expected due to no prior prescriptions written by our practice. No notes on file Pharmacokinetics: Liberation and absorption (onset of action): WNL Distribution (time to peak effect): WNL Metabolism and excretion (duration of action): WNL         Pharmacodynamics: Desired effects: Analgesia: [name] reports >50% benefit. Functional ability: Patient reports that medication allows him to accomplish basic ADLs Clinically meaningful improvement in function (CMIF): Sustained CMIF goals met Perceived effectiveness: Described as relatively effective, allowing for increase in activities of daily living (ADL) Undesirable effects: Side-effects or Adverse reactions: None reported Monitoring: [place] PMP: Online review of the past 1-monthperiod previously conducted. Not applicable at this point since we have not taken over the patient's medication management yet. List of other Serum/Urine Drug Screening Test(s):  No results found for: AMPHSCRSER, BARBSCRSER, BENZOSCRSER, COCAINSCRSER, COCAINSCRNUR, [place] THCSCRSER, THCU, [place] Creek[place] of all UDS test(s) done:  Lab Results  Component Value Date   SUMMARY FINAL 01/05/2018   Last UDS on record: Summary  Date Value Ref Range Status  01/05/2018 FINAL  Final    Comment:    ==================================================================== TOXASSURE COMP DRUG ANALYSIS,UR ==================================================================== Test                             Result       Flag       Units Drug Present and Declared for Prescription Verification   Citalopram                      PRESENT      EXPECTED   Desmethylcitalopram            PRESENT      EXPECTED    Desmethylcitalopram is an expected metabolite of citalopram or    the enantiomeric form, escitalopram. Drug Present not Declared for Prescription Verification   Carboxy-THC                    602          UNEXPECTED ng/mg creat    Carboxy-THC is a metabolite of tetrahydrocannabinol  (THC).    Source of [facility]is most commonly illicit, but THC is also present    in a scheduled prescription medication.   Acetaminophen                  PRESENT      UNEXPECTED   Dextromethorphan               PRESENT      UNEXPECTED   Guaifenesin                    PRESENT      UNEXPECTED    Guaifenesin may be administered as an over-the-counter or    prescription drug; it may also be present as a breakdown product    of methocarbamol. Drug Absent but Declared for Prescription Verification   Salicylate  Not Detected UNEXPECTED    Aspirin, as indicated in the declared medication list, is not    always detected even when used as directed.   Hydroxyzine                    Not Detected UNEXPECTED ==================================================================== Test                      Result    Flag   Units      Ref Range   Creatinine              50               mg/dL      >=20 ==================================================================== Declared Medications:  The flagging and interpretation on this report are based on the  following declared medications.  Unexpected results may arise from  inaccuracies in the declared medications.  **Note: The testing scope of this panel includes these medications:  Citalopram (Celexa)  Hydroxyzine (Atarax)  **Note: The testing scope of this panel does not include small to  moderate amounts of these reported medications:  Aspirin (Aspirin 81)  **Note: The testing scope of this panel does not include following  reported medications:  Fluticasone (Flonase)   Furosemide (Lasix)  Glimepiride (Amaryl)  Metformin (Glucophage)  Pantoprazole (Protonix)  Simvastatin (Zocor)  Supplement (Probiotic)  Tiotropium (Spiriva) ==================================================================== For clinical consultation, please call [phone]. ====================================================================    UDS interpretation: No unexpected findings.          Medication Assessment Form: Patient introduced to form today Treatment compliance: Treatment may start today if patient agrees with proposed plan. Evaluation of compliance is not applicable at this point Risk Assessment Profile: Aberrant behavior: See initial evaluations. None observed or detected today Comorbid factors increasing risk of overdose: See initial evaluation. No additional risks detected today Opioid risk tool (ORT) (Total Score):   Personal History of Substance Abuse (SUD-Substance use disorder):  Alcohol:    Illegal Drugs:    Rx Drugs:    ORT Risk Level calculation:   Risk of substance use disorder (SUD): Low  ORT Scoring interpretation table:  Score <3 = Low Risk for SUD  Score between 4-7 = Moderate Risk for SUD  Score >8 = High Risk for Opioid Abuse   Risk Mitigation Strategies:  Patient opioid safety counseling: Completed today. Counseling provided to patient as per "Patient Counseling Document". Document signed by patient, attesting to counseling and understanding Patient-Prescriber Agreement (PPA): Obtained today.  Controlled substance notification to other providers: Written and sent today.  Pharmacologic Plan: Today we may be taking over the patient's pharmacological regimen. See below.             Laboratory Chemistry  Inflammation Markers (CRP: Acute Phase) (ESR: Chronic Phase) Lab Results  Component Value Date   CRP 9 01/05/2018   ESRSEDRATE 33 (H) 01/05/2018                         Rheumatology Markers No results found for: RF, ANA, LABURIC,  URICUR, LYMEIGGIGMAB, LYMEABIGMQN, HLAB27                      Renal Function Markers Lab Results  Component Value Date   BUN 7 (L) 01/05/2018   CREATININE 0.59 (L) 01/05/2018   BCR 12 01/05/2018   GFRAA 125 01/05/2018   GFRNONAA 109 01/05/2018  Hepatic Function Markers Lab Results  Component Value Date   AST 17 01/05/2018   ALT 19 10/17/2017   ALBUMIN 4.6 01/05/2018   ALKPHOS 65 01/05/2018   HCVAB NEGATIVE 10/04/2016   LIPASE 23.0 08/06/2016                        Electrolytes Lab Results  Component Value Date   NA 142 01/05/2018   K 4.6 01/05/2018   CL 97 01/05/2018   CALCIUM 9.9 01/05/2018   MG 1.8 01/05/2018                        Neuropathy Markers Lab Results  Component Value Date   VITAMINB12 354 01/05/2018   HGBA1C 6.8 (H) 10/17/2017   HIV NONREACTIVE 10/04/2016                        CNS Tests No results found for: COLORCSF, APPEARCSF, RBCCOUNTCSF, WBCCSF, POLYSCSF, LYMPHSCSF, EOSCSF, PROTEINCSF, GLUCCSF, JCVIRUS, CSFOLI, IGGCSF                      Bone Pathology Markers Lab Results  Component Value Date   25OHVITD1 25 (L) 01/05/2018   25OHVITD2 <1.0 01/05/2018   25OHVITD3 25 01/05/2018                         Coagulation Parameters Lab Results  Component Value Date   INR 2.79 (H) 09/22/2011   LABPROT 29.9 (H) 09/22/2011   APTT 32 08/03/2011   PLT 253.0 10/17/2017                        Cardiovascular Markers Lab Results  Component Value Date   BNP 39 04/22/2012   CKTOTAL 81 02/19/2012   CKMB 1.7 02/19/2012   TROPONINI < 0.02 06/20/2013   HGB 15.0 10/17/2017   HCT 44.3 10/17/2017                         CA Markers No results found for: CEA, CA125, LABCA2                      Note: Lab results reviewed.  Recent Diagnostic Imaging Review  Cervical Imaging: Cervical DG complete:  Results for orders placed during the hospital encounter of 11/29/17  DG Cervical Spine Complete   Narrative CLINICAL DATA:   Right-sided neck pain.  EXAM: CERVICAL SPINE - COMPLETE 4+ VIEW  COMPARISON:  None.  FINDINGS: No definite fracture is noted. Mild grade 1 anterolisthesis of C4-5 is noted. Severe degenerative disc disease is noted at C5-6 and C6-7 with anterior osteophyte formation. Moderate bilateral neural foraminal stenosis is noted at these levels secondary to uncovertebral spurring. Reversal of normal lordosis of cervical spine is noted most likely due to degenerative change. Degenerative changes seen involving the right-sided posterior facet joints of C2-3 and C3-4.  IMPRESSION: Severe degenerative disc disease is noted at C5-6 and C6-7 with moderate bilateral neural foraminal stenosis at these levels secondary to uncovertebral spurring. No acute abnormality is noted in the cervical spine.   Electronically Signed   By: Marijo Conception, M.D.   On: 11/30/2017 09:14    Complexity Note: Imaging results reviewed. Results shared with Mr. Armitage, using Layman's terms.  Meds   Current Outpatient Medications:  .  ACCU-CHEK FASTCLIX LANCETS MISC, Check blood sugar once daily and as instructed. Dx 250.00, Disp: 100 each, Rfl: 3 .  aspirin EC 81 MG tablet, Take 81 mg by mouth daily., Disp: , Rfl:  .  Blood Glucose Monitoring Suppl (BLOOD GLUCOSE MONITOR SYSTEM) W/DEVICE KIT, by Does not apply route. Use to check sugar once daily and as needed Dx: E11.9 **ONE TOUCH VERIO**, Disp: , Rfl:  .  citalopram (CELEXA) 10 MG tablet, Take 1 tablet (10 mg total) by mouth daily., Disp: 30 tablet, Rfl: 11 .  fluticasone (FLONASE) 50 MCG/ACT nasal spray, SPRAY 2 SPRAYS INTO EACH NOSTRIL EVERY DAY, Disp: 16 g, Rfl: 3 .  furosemide (LASIX) 20 MG tablet, TAKE 1 TABLET (20 MG TOTAL) BY MOUTH 2 (TWO) TIMES DAILY AS NEEDED., Disp: 90 tablet, Rfl: 3 .  glimepiride (AMARYL) 2 MG tablet, Take 1 tablet (2 mg total) by mouth daily with breakfast., Disp: 90 tablet, Rfl: 1 .  hydrOXYzine  (ATARAX/VISTARIL) 25 MG tablet, TAKE 1 TABLET (25 MG TOTAL) BY MOUTH 2 (TWO) TIMES DAILY AS NEEDED FOR ANXIETY., Disp: 60 tablet, Rfl: 5 .  metFORMIN (GLUCOPHAGE) 1000 MG tablet, Take 1 tablet (1,000 mg total) by mouth 2 (two) times daily with a meal., Disp: 180 tablet, Rfl: 1 .  ONETOUCH VERIO test strip, USE AS INSTRUCTED TO CHECK THREE TIMES DAILY AND AS NEEDED. E11.8, Disp: 300 each, Rfl: 3 .  pantoprazole (PROTONIX) 40 MG tablet, Take 1 tablet (40 mg total) by mouth daily., Disp: 30 tablet, Rfl: 11 .  Probiotic Product (PROBIOTIC DAILY PO), Take by mouth., Disp: , Rfl:  .  simvastatin (ZOCOR) 20 MG tablet, TAKE 1 TABLET (20 MG TOTAL) BY MOUTH EVERY EVENING., Disp: 90 tablet, Rfl: 3 .  tiotropium (SPIRIVA HANDIHALER) 18 MCG inhalation capsule, Place 1 capsule (18 mcg total) into inhaler and inhale daily., Disp: 30 capsule, Rfl: 2  ROS  Constitutional: Denies any fever or chills Gastrointestinal: No reported hemesis, hematochezia, vomiting, or acute GI distress Musculoskeletal: Denies any acute onset joint swelling, redness, loss of ROM, or weakness Neurological: No reported episodes of acute onset apraxia, aphasia, dysarthria, agnosia, amnesia, paralysis, loss of coordination, or loss of consciousness  Allergies  Mr. Mayeux is allergic to atorvastatin and lisinopril.  PFSH  Drug: Mr. Barret  reports that he has current or past drug history. Drug: Marijuana. Alcohol:  reports that he does not drink alcohol. Tobacco:  reports that he quit smoking about 5 years ago. His smoking use included cigarettes. He started smoking about 49 years ago. He has a 12.00 pack-year smoking history. He has never used smokeless tobacco. Medical:  has a past medical history of Abnormal drug screen (2015), Angina, Anxiety, Arthritis, CAD (coronary artery disease), Cannabis abuse, Chronic systolic CHF (congestive heart failure) ([place]) (2013), COPD (chronic obstructive pulmonary disease) ([place]), Diabetes type 2,  uncontrolled ([place]), Dilated cardiomyopathy ([place]) (2013), Ex-smoker, Frequent headaches, History of atrial fibrillation (2013), Hyperlipidemia, Hypertension, Migraine, Nonischemic cardiomyopathy ([place]) (2013), Obesity, OSA (obstructive sleep apnea), and Seasonal allergies. Surgical: Mr. Rosselli  has a past surgical history that includes Cardiac electrophysiology mapping and ablation (2013); US ECHOCARDIOGRAPHY (2014); Cardiovascular stress test (03/2012); and Atrial flutter ablation (N/A, 09/21/2011). Family: family history includes CAD (age of onset: 42) in his maternal grandfather; Cancer (age of onset: 82) in his brother; Cancer (age of onset: 51) in his mother; Diabetes in his other; Heart failure (age of onset: 24) in his father; Hypertension in  his maternal grandfather.  Constitutional Exam  General appearance: Well nourished, well developed, and well hydrated. In no apparent acute distress There were no vitals filed for this visit. BMI Assessment: Estimated body mass index is 34.87 kg/m as calculated from the following:   Height as of 01/05/18: '5\' 11"'$  (1.803 m).   Weight as of 01/05/18: 250 lb (113.4 kg).  BMI interpretation table: BMI level Category Range association with higher incidence of chronic pain  <18 kg/m2 Underweight   18.5-24.9 kg/m2 Ideal body weight   25-29.9 kg/m2 Overweight Increased incidence by 20%  30-34.9 kg/m2 Obese (Class I) Increased incidence by 68%  35-39.9 kg/m2 Severe obesity (Class II) Increased incidence by 136%  >40 kg/m2 Extreme obesity (Class III) Increased incidence by 254%   Patient's current BMI Ideal Body weight  There is no height or weight on file to calculate BMI. Patient weight not recorded   BMI Readings from Last 4 Encounters:  01/05/18 34.87 kg/m  12/23/17 36.01 kg/m  11/29/17 37.09 kg/m  10/17/17 38.61 kg/m   Wt Readings from Last 4 Encounters:  01/05/18 250 lb (113.4 kg)  12/23/17 251 lb (113.9 kg)  11/29/17 258 lb 8 oz (117.3 kg)   10/17/17 265 lb 4 oz (120.3 kg)  Psych/Mental status: Alert, oriented x 3 (person, place, & time)       Eyes: PERLA Respiratory: No evidence of acute respiratory distress  Cervical Spine Area Exam  Skin & Axial Inspection: No masses, redness, edema, swelling, or associated skin lesions Alignment: Symmetrical Functional ROM: Unrestricted ROM      Stability: No instability detected Muscle Tone/Strength: Functionally intact. No obvious neuro-muscular anomalies detected. Sensory (Neurological): Unimpaired Palpation: No palpable anomalies              Upper Extremity (UE) Exam    Side: Right upper extremity  Side: Left upper extremity  Skin & Extremity Inspection: Skin color, temperature, and hair growth are WNL. No peripheral edema or cyanosis. No masses, redness, swelling, asymmetry, or associated skin lesions. No contractures.  Skin & Extremity Inspection: Skin color, temperature, and hair growth are WNL. No peripheral edema or cyanosis. No masses, redness, swelling, asymmetry, or associated skin lesions. No contractures.  Functional ROM: Unrestricted ROM          Functional ROM: Unrestricted ROM          Muscle Tone/Strength: Functionally intact. No obvious neuro-muscular anomalies detected.  Muscle Tone/Strength: Functionally intact. No obvious neuro-muscular anomalies detected.  Sensory (Neurological): Unimpaired          Sensory (Neurological): Unimpaired          Palpation: No palpable anomalies              Palpation: No palpable anomalies              Provocative Test(s):  Phalen's test: deferred Tinel's test: deferred Apley's scratch test (touch opposite shoulder):  Action 1 (Across chest): deferred Action 2 (Overhead): deferred Action 3 (LB reach): deferred   Provocative Test(s):  Phalen's test: deferred Tinel's test: deferred Apley's scratch test (touch opposite shoulder):  Action 1 (Across chest): deferred Action 2 (Overhead): deferred Action 3 (LB reach): deferred     Thoracic Spine Area Exam  Skin & Axial Inspection: No masses, redness, or swelling Alignment: Symmetrical Functional ROM: Unrestricted ROM Stability: No instability detected Muscle Tone/Strength: Functionally intact. No obvious neuro-muscular anomalies detected. Sensory (Neurological): Unimpaired Muscle strength & Tone: No palpable anomalies  Lumbar Spine Area Exam  Skin &  Axial Inspection: No masses, redness, or swelling Alignment: Symmetrical Functional ROM: Unrestricted ROM       Stability: No instability detected Muscle Tone/Strength: Functionally intact. No obvious neuro-muscular anomalies detected. Sensory (Neurological): Unimpaired Palpation: No palpable anomalies       Provocative Tests: Hyperextension/rotation test: deferred today       Lumbar quadrant test (Kemp's test): deferred today       Lateral bending test: deferred today       Patrick's Maneuver: deferred today                   FABER test: deferred today                   S-I anterior distraction/compression test: deferred today         S-I lateral compression test: deferred today         S-I Thigh-thrust test: deferred today         S-I Gaenslen's test: deferred today          Gait & Posture Assessment  Ambulation: Unassisted Gait: Relatively normal for age and body habitus Posture: WNL   Lower Extremity Exam    Side: Right lower extremity  Side: Left lower extremity  Stability: No instability observed          Stability: No instability observed          Skin & Extremity Inspection: Skin color, temperature, and hair growth are WNL. No peripheral edema or cyanosis. No masses, redness, swelling, asymmetry, or associated skin lesions. No contractures.  Skin & Extremity Inspection: Skin color, temperature, and hair growth are WNL. No peripheral edema or cyanosis. No masses, redness, swelling, asymmetry, or associated skin lesions. No contractures.  Functional ROM: Unrestricted ROM                  Functional  ROM: Unrestricted ROM                  Muscle Tone/Strength: Functionally intact. No obvious neuro-muscular anomalies detected.  Muscle Tone/Strength: Functionally intact. No obvious neuro-muscular anomalies detected.  Sensory (Neurological): Unimpaired  Sensory (Neurological): Unimpaired  Palpation: No palpable anomalies  Palpation: No palpable anomalies   Assessment & Plan  Primary Diagnosis & Pertinent Problem List: There were no encounter diagnoses.  Visit Diagnosis: No diagnosis found. Problems updated and reviewed during this visit: No problems updated.  Plan of Care  Pharmacotherapy (Medications Ordered): No orders of the defined types were placed in this encounter.  Procedure Orders    No procedure(s) ordered today   Lab Orders  No laboratory test(s) ordered today   Imaging Orders  No imaging studies ordered today   Referral Orders  No referral(s) requested today    Pharmacological management options:  Opioid Analgesics: We'll take over management today. See above orders Membrane stabilizer: We have discussed the possibility of optimizing this mode of therapy, if tolerated Muscle relaxant: We have discussed the possibility of a trial NSAID: We have discussed the possibility of a trial Other analgesic(s): To be determined at a later time   Interventional management options: Planned, scheduled, and/or pending:    ***   Considering:   ***   PRN Procedures:   None at this time   Provider-requested follow-up: No follow-ups on file.  Future Appointments  Date Time Provider [place]  01/25/2018 10:30 AM Milinda Pointer, [place] None    Primary Care Physician: [name], MD Location: [facility] Out[facility] Pain Management [facility]  Note by: Gaspar Cola, MD Date: 01/25/2018; Time: 7:37 AM

## 2018-01-25 ENCOUNTER — Ambulatory Visit: Payer: Medicare Other | Admitting: Pain Medicine

## 2018-02-06 NOTE — Progress Notes (Deleted)
Patient's Name: [name]  MRN: [id number]  Referring Provider: [name], MD  DOB: 1955/05/31  PCP: [name], MD  DOS: 02/08/2018  Note by: Gaspar Cola, MD  Service setting: Ambulatory outpatient  Specialty: Interventional Pain Management  Location: [facility] (AMB) Pain Management Facility    Patient type: Established   Primary Reason(s) for Visit: Encounter for evaluation before starting new chronic pain management plan of care (Level of risk: moderate) CC: No chief complaint on file.  HPI  [name] is a 62 y.o. year old, male patient, who comes today for a follow-up evaluation to review the test results and decide on a treatment plan. He has Ex-smoker; Sleep apnea, central; Allergic rhinitis; DDD (degenerative disc disease), cervical; Atrial fibrillation and flutter ([place]); Morbid obesity ([place]); Hypertension; Mixed hyperlipidemia; Panic attacks; Coronary artery disease; Type II diabetes mellitus ([place]); Chest pain; Malaise and fatigue; Exertional dyspnea; Medicare annual wellness visit, subsequent; Umbilical hernia without obstruction and without gangrene; Left flank mass; Abnormal toxicological findings; Advanced care planning/counseling discussion; Health maintenance examination; Acute sinusitis; Gassiness; Tinea pedis; Chronic systolic CHF (congestive heart failure) ([place]); Restrictive lung disease; Right flank pain; 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[place]); Right shoulder pain; Fatty liver; Adrenal adenoma, left; Nodule of right lung; Aortic dilatation ([place]); Right leg weakness; Right-sided headache; AAA (abdominal aortic aneurysm) without rupture (HCC); CHF (congestive heart failure) ([place]); Other dyspnea and respiratory abnormality; SOB (shortness of breath); Pain in right eye (Primary Area of Pain); Right sided facial pain (Secondary Area of Pain); Chronic neck pain (Tertiary Area of Pain) (R>L); Chronic upper extremity pain (Fourth Area of Pain) (R>L); Chronic upper back pain;  Chronic bilateral low back pain without sciatica; Chronic pain syndrome; Pharmacologic therapy; Disorder of skeletal system; and Problems influencing health status on their problem list. His primarily concern today is the No chief complaint on file.  Pain Assessment: Location:     Radiating:   Onset:   Duration:   Quality:   Severity:  /10 (subjective, self-reported pain score)  Note: Reported level is compatible with observation.                         When using our objective Pain Scale, levels between 6 and 10/10 are said to belong in an emergency room, as it progressively worsens from a 6/10, described as severely limiting, requiring emergency care not usually available at an outpatient pain management facility. At a 6/10 level, communication becomes difficult and requires great effort. Assistance to reach the emergency department may be required. Facial flushing and profuse sweating along with potentially dangerous increases in heart rate and blood pressure will be evident. Effect on ADL:   Timing:   Modifying factors:   BP:    HR:    [name] comes in today for a follow-up visit after his initial evaluation on 01/05/2018. Today we went over the results of his tests. These were explained in "Layman's terms". During today's appointment we went over my diagnostic impression, as well as the proposed treatment plan.  ***  In considering the treatment plan options, [name] was reminded that I no longer take patients for medication management only. I asked him to let me know if he had no intention of taking advantage of the interventional therapies, so that we could make arrangements to provide this space to someone interested. I also made it clear that undergoing interventional therapies for the purpose of getting pain medications is very inappropriate on the part  of a patient, and it will not be tolerated in this practice. This type of behavior would suggest true addiction and therefore  it requires referral to an addiction specialist.   Further details on both, my assessment(s), as well as the proposed treatment plan, please see below.  Controlled Substance Pharmacotherapy Assessment REMS (Risk Evaluation and Mitigation Strategy)  Analgesic: ***  MME/day: *** mg/day. Pill Count: None expected due to no prior prescriptions written by our practice. No notes on file Pharmacokinetics: Liberation and absorption (onset of action): WNL Distribution (time to peak effect): WNL Metabolism and excretion (duration of action): WNL         Pharmacodynamics: Desired effects: Analgesia: [name] reports >50% benefit. Functional ability: Patient reports that medication allows him to accomplish basic ADLs Clinically meaningful improvement in function (CMIF): Sustained CMIF goals met Perceived effectiveness: Described as relatively effective, allowing for increase in activities of daily living (ADL) Undesirable effects: Side-effects or Adverse reactions: None reported Monitoring: [place] PMP: Online review of the past 1-monthperiod previously conducted. Not applicable at this point since we have not taken over the patient's medication management yet. List of other Serum/Urine Drug Screening Test(s):  No results found for: AMPHSCRSER, BARBSCRSER, BENZOSCRSER, COCAINSCRSER, COCAINSCRNUR, [place] THCSCRSER, THCU, [place] UDS test(s) done:  Lab Results  Component Value Date   SUMMARY FINAL 01/05/2018   Last UDS on record: Summary  Date Value Ref Range Status  01/05/2018 FINAL  Final    Comment:    ==================================================================== TOXASSURE COMP DRUG ANALYSIS,UR ==================================================================== Test                             Result       Flag       Units Drug Present and Declared for Prescription Verification   Citalopram                      PRESENT      EXPECTED   Desmethylcitalopram            PRESENT      EXPECTED    Desmethylcitalopram is an expected metabolite of citalopram or    the enantiomeric form, escitalopram. Drug Present not Declared for Prescription Verification   Carboxy-THC                    602          UNEXPECTED ng/mg creat    Carboxy-THC is a metabolite of tetrahydrocannabinol  (THC).    [facility] of [facility] most commonly illicit, but THC is also present    in a scheduled prescription medication.   Acetaminophen                  PRESENT      UNEXPECTED   Dextromethorphan               PRESENT      UNEXPECTED   Guaifenesin                    PRESENT      UNEXPECTED    Guaifenesin may be administered as an over-the-counter or    prescription drug; it may also be present as a breakdown product    of methocarbamol. Drug Absent but Declared for Prescription Verification   Salicylate  Not Detected UNEXPECTED    Aspirin, as indicated in the declared medication list, is not    always detected even when used as directed.   Hydroxyzine                    Not Detected UNEXPECTED ==================================================================== Test                      Result    Flag   Units      Ref Range   Creatinine              50               mg/dL      >=20 ==================================================================== Declared Medications:  The flagging and interpretation on this report are based on the  following declared medications.  Unexpected results may arise from  inaccuracies in the declared medications.  **Note: The testing scope of this panel includes these medications:  Citalopram (Celexa)  Hydroxyzine (Atarax)  **Note: The testing scope of this panel does not include small to  moderate amounts of these reported medications:  Aspirin (Aspirin 81)  **Note: The testing scope of this panel does not include following  reported medications:  Fluticasone (Flonase)   Furosemide (Lasix)  Glimepiride (Amaryl)  Metformin (Glucophage)  Pantoprazole (Protonix)  Simvastatin (Zocor)  Supplement (Probiotic)  Tiotropium (Spiriva) ==================================================================== For clinical consultation, please call [phone]. ====================================================================    UDS interpretation: No unexpected findings.          Medication Assessment Form: Patient introduced to form today Treatment compliance: Treatment may start today if patient agrees with proposed plan. Evaluation of compliance is not applicable at this point Risk Assessment Profile: Aberrant behavior: See initial evaluations. None observed or detected today Comorbid factors increasing risk of overdose: See initial evaluation. No additional risks detected today Opioid risk tool (ORT) (Total Score):   Personal History of Substance Abuse (SUD-Substance use disorder):  Alcohol:    Illegal Drugs:    Rx Drugs:    ORT Risk Level calculation:   Risk of substance use disorder (SUD): Low  ORT Scoring interpretation table:  Score <3 = Low Risk for SUD  Score between 4-7 = Moderate Risk for SUD  Score >8 = High Risk for Opioid Abuse   Risk Mitigation Strategies:  Patient opioid safety counseling: Completed today. Counseling provided to patient as per "Patient Counseling Document". Document signed by patient, attesting to counseling and understanding Patient-Prescriber Agreement (PPA): Obtained today.  Controlled substance notification to other providers: Written and sent today.  Pharmacologic Plan: Today we may be taking over the patient's pharmacological regimen. See below.             Laboratory Chemistry  Inflammation Markers (CRP: Acute Phase) (ESR: Chronic Phase) Lab Results  Component Value Date   CRP 9 01/05/2018   ESRSEDRATE 33 (H) 01/05/2018                         Rheumatology Markers No results found for: RF, ANA, LABURIC,  URICUR, LYMEIGGIGMAB, LYMEABIGMQN, HLAB27                      Renal Function Markers Lab Results  Component Value Date   BUN 7 (L) 01/05/2018   CREATININE 0.59 (L) 01/05/2018   BCR 12 01/05/2018   GFRAA 125 01/05/2018   GFRNONAA 109 01/05/2018  Hepatic Function Markers Lab Results  Component Value Date   AST 17 01/05/2018   ALT 19 10/17/2017   ALBUMIN 4.6 01/05/2018   ALKPHOS 65 01/05/2018   HCVAB NEGATIVE 10/04/2016   LIPASE 23.0 08/06/2016                        Electrolytes Lab Results  Component Value Date   NA 142 01/05/2018   K 4.6 01/05/2018   CL 97 01/05/2018   CALCIUM 9.9 01/05/2018   MG 1.8 01/05/2018                        Neuropathy Markers Lab Results  Component Value Date   VITAMINB12 354 01/05/2018   HGBA1C 6.8 (H) 10/17/2017   HIV NONREACTIVE 10/04/2016                        CNS Tests No results found for: COLORCSF, APPEARCSF, RBCCOUNTCSF, WBCCSF, POLYSCSF, LYMPHSCSF, EOSCSF, PROTEINCSF, GLUCCSF, JCVIRUS, CSFOLI, IGGCSF                      Bone Pathology Markers Lab Results  Component Value Date   25OHVITD1 25 (L) 01/05/2018   25OHVITD2 <1.0 01/05/2018   25OHVITD3 25 01/05/2018                         Coagulation Parameters Lab Results  Component Value Date   INR 2.79 (H) 09/22/2011   LABPROT 29.9 (H) 09/22/2011   APTT 32 08/03/2011   PLT 253.0 10/17/2017                        Cardiovascular Markers Lab Results  Component Value Date   BNP 39 04/22/2012   CKTOTAL 81 02/19/2012   CKMB 1.7 02/19/2012   TROPONINI < 0.02 06/20/2013   HGB 15.0 10/17/2017   HCT 44.3 10/17/2017                         CA Markers No results found for: CEA, CA125, LABCA2                      Note: Lab results reviewed.  Recent Diagnostic Imaging Review  Cervical Imaging: Cervical MR wo contrast: No results found for this or any previous visit. Cervical MR wo contrast: No procedure found. Cervical MR w/wo contrast: No  results found for this or any previous visit. Cervical MR w contrast: No results found for this or any previous visit. Cervical CT wo contrast: No results found for this or any previous visit. Cervical CT w/wo contrast: No results found for this or any previous visit. Cervical CT w/wo contrast: No results found for this or any previous visit. Cervical CT w contrast: No results found for this or any previous visit. Cervical CT outside: No results found for this or any previous visit. Cervical DG 1 view: No results found for this or any previous visit. Cervical DG 2-3 views: No results found for this or any previous visit. Cervical DG F/E views: No results found for this or any previous visit. Cervical DG 2-3 clearing views: No results found for this or any previous visit. Cervical DG Bending/F/E views: No results found for this or any previous visit. Cervical DG complete:  Results for orders placed during the hospital encounter of  11/29/17  DG Cervical Spine Complete   Narrative CLINICAL DATA:  Right-sided neck pain.  EXAM: CERVICAL SPINE - COMPLETE 4+ VIEW  COMPARISON:  None.  FINDINGS: No definite fracture is noted. Mild grade 1 anterolisthesis of C4-5 is noted. Severe degenerative disc disease is noted at C5-6 and C6-7 with anterior osteophyte formation. Moderate bilateral neural foraminal stenosis is noted at these levels secondary to uncovertebral spurring. Reversal of normal lordosis of cervical spine is noted most likely due to degenerative change. Degenerative changes seen involving the right-sided posterior facet joints of C2-3 and C3-4.  IMPRESSION: Severe degenerative disc disease is noted at C5-6 and C6-7 with moderate bilateral neural foraminal stenosis at these levels secondary to uncovertebral spurring. No acute abnormality is noted in the cervical spine.   Electronically Signed   By: Marijo Conception, M.D.   On: 11/30/2017 09:14    Cervical DG Myelogram  views: No results found for this or any previous visit. Cervical DG Myelogram views: No results found for this or any previous visit. Cervical Discogram views: No results found for this or any previous visit.  Shoulder Imaging: Shoulder-R MR w contrast: No results found for this or any previous visit. Shoulder-L MR w contrast: No results found for this or any previous visit. Shoulder-R MR w/wo contrast: No results found for this or any previous visit. Shoulder-L MR w/wo contrast: No results found for this or any previous visit. Shoulder-R MR wo contrast: No results found for this or any previous visit. Shoulder-L MR wo contrast: No results found for this or any previous visit. Shoulder-R CT w contrast: No results found for this or any previous visit. Shoulder-L CT w contrast: No results found for this or any previous visit. Shoulder-R CT w/wo contrast: No results found for this or any previous visit. Shoulder-L CT w/wo contrast: No results found for this or any previous visit. Shoulder-R CT wo contrast: No results found for this or any previous visit. Shoulder-L CT wo contrast: No results found for this or any previous visit. Shoulder-R DG Arthrogram: No results found for this or any previous visit. Shoulder-L DG Arthrogram: No results found for this or any previous visit. Shoulder-R DG 1 view: No results found for this or any previous visit. Shoulder-L DG 1 view: No results found for this or any previous visit. Shoulder-R DG: No results found for this or any previous visit. Shoulder-L DG: No results found for this or any previous visit.  Thoracic Imaging: Thoracic MR wo contrast: No results found for this or any previous visit. Thoracic MR wo contrast: No procedure found. Thoracic MR w/wo contrast: No results found for this or any previous visit. Thoracic MR w contrast: No results found for this or any previous visit. Thoracic CT wo contrast: No results found for this or any previous  visit. Thoracic CT w/wo contrast: No results found for this or any previous visit. Thoracic CT w/wo contrast: No results found for this or any previous visit. Thoracic CT w contrast: No results found for this or any previous visit. Thoracic DG 2-3 views: No results found for this or any previous visit. Thoracic DG 4 views: No results found for this or any previous visit. Thoracic DG: No results found for this or any previous visit. Thoracic DG w/swimmers view: No results found for this or any previous visit. Thoracic DG Myelogram views: No results found for this or any previous visit. Thoracic DG Myelogram views: No results found for this or any previous visit.  Lumbosacral Imaging: Lumbar MR wo contrast: No results found for this or any previous visit. Lumbar MR wo contrast: No procedure found. Lumbar MR w/wo contrast: No results found for this or any previous visit. Lumbar MR w contrast: No results found for this or any previous visit. Lumbar CT wo contrast: No results found for this or any previous visit. Lumbar CT w/wo contrast: No results found for this or any previous visit. Lumbar CT w/wo contrast: No results found for this or any previous visit. Lumbar CT w contrast: No results found for this or any previous visit. Lumbar DG 1V: No results found for this or any previous visit. Lumbar DG 1V (Clearing): No results found for this or any previous visit. Lumbar DG 2-3V (Clearing): No results found for this or any previous visit. Lumbar DG 2-3 views: No results found for this or any previous visit. Lumbar DG (Complete) 4+V: No results found for this or any previous visit. Lumbar DG F/E views: No results found for this or any previous visit. Lumbar DG Bending views: No results found for this or any previous visit. Lumbar DG Myelogram views: No results found for this or any previous visit. Lumbar DG Myelogram: No results found for this or any previous visit. Lumbar DG Myelogram: No  results found for this or any previous visit. Lumbar DG Myelogram: No results found for this or any previous visit. Lumbar DG Myelogram Lumbosacral: No results found for this or any previous visit. Lumbar DG Diskogram views: No results found for this or any previous visit. Lumbar DG Diskogram views: No results found for this or any previous visit. Lumbar DG Epidurogram OP: No results found for this or any previous visit. Lumbar DG Epidurogram IP: No results found for this or any previous visit.  Sacroiliac Joint Imaging: Sacroiliac Joint DG: No results found for this or any previous visit. Sacroiliac Joint MR w/wo contrast: No results found for this or any previous visit. Sacroiliac Joint MR wo contrast: No results found for this or any previous visit.  Spine Imaging: Whole Spine DG Myelogram views: No results found for this or any previous visit. Whole Spine MR Mets screen: No results found for this or any previous visit. Whole Spine MR Mets screen: No results found for this or any previous visit. Whole Spine MR w/wo: No results found for this or any previous visit. MRA Spinal Canal w/ cm: No results found for this or any previous visit. MRA Spinal Canal wo/ cm: No procedure found. MRA Spinal Canal w/wo cm: No results found for this or any previous visit. Spine Outside MR Films: No results found for this or any previous visit. Spine Outside CT Films: No results found for this or any previous visit. CT-Guided Biopsy: No results found for this or any previous visit. CT-Guided Needle Placement: No results found for this or any previous visit. DG Spine outside: No results found for this or any previous visit. IR Spine outside: No results found for this or any previous visit. NM Spine outside: No results found for this or any previous visit. Epidurography 1: No results found for this or any previous visit. Epidurography 2: No results found for this or any previous visit.  Hip  Imaging: Hip-R MR w contrast: No results found for this or any previous visit. Hip-L MR w contrast: No results found for this or any previous visit. Hip-R MR w/wo contrast: No results found for this or any previous visit. Hip-L MR w/wo contrast: No results found  for this or any previous visit. Hip-R MR wo contrast: No results found for this or any previous visit. Hip-L MR wo contrast: No results found for this or any previous visit. Hip-R CT w contrast: No results found for this or any previous visit. Hip-L CT w contrast: No results found for this or any previous visit. Hip-R CT w/wo contrast: No results found for this or any previous visit. Hip-L CT w/wo contrast: No results found for this or any previous visit. Hip-R CT wo contrast: No results found for this or any previous visit. Hip-L CT wo contrast: No results found for this or any previous visit. Hip-R DG 2-3 views: No results found for this or any previous visit. Hip-L DG 2-3 views: No results found for this or any previous visit. Hip-R DG Arthrogram: No results found for this or any previous visit. Hip-L DG Arthrogram: No results found for this or any previous visit. Hip-B DG Bilateral: No results found for this or any previous visit.  Knee Imaging: Knee-R MR w contrast: No results found for this or any previous visit. Knee-L MR w contrast: No results found for this or any previous visit. Knee-R MR w/wo contrast: No results found for this or any previous visit. Knee-L MR w/wo contrast: No results found for this or any previous visit. Knee-R MR wo contrast: No results found for this or any previous visit. Knee-L MR wo contrast: No results found for this or any previous visit. Knee-R CT w contrast: No results found for this or any previous visit. Knee-L CT w contrast: No results found for this or any previous visit. Knee-R CT w/wo contrast: No results found for this or any previous visit. Knee-L CT w/wo contrast: No results found  for this or any previous visit. Knee-R CT wo contrast: No results found for this or any previous visit. Knee-L CT wo contrast: No results found for this or any previous visit. Knee-R DG 1-2 views: No results found for this or any previous visit. Knee-L DG 1-2 views: No results found for this or any previous visit. Knee-R DG 3 views: No results found for this or any previous visit. Knee-L DG 3 views: No results found for this or any previous visit. Knee-R DG 4 views: No results found for this or any previous visit. Knee-L DG 4 views: No results found for this or any previous visit. Knee-R DG Arthrogram: No results found for this or any previous visit. Knee-L DG Arthrogram: No results found for this or any previous visit.  Ankle Imaging: Ankle-R DG Complete: No results found for this or any previous visit. Ankle-L DG Complete: No results found for this or any previous visit.  Foot Imaging: Foot-R DG Complete: No results found for this or any previous visit. Foot-L DG Complete: No results found for this or any previous visit.  Complexity Note: Imaging results reviewed. Results shared with Mr. Honse, using Layman's terms.                         Meds   Current Outpatient Medications:  .  ACCU-CHEK FASTCLIX LANCETS MISC, Check blood sugar once daily and as instructed. Dx 250.00, Disp: 100 each, Rfl: 3 .  aspirin EC 81 MG tablet, Take 81 mg by mouth daily., Disp: , Rfl:  .  Blood Glucose Monitoring Suppl (BLOOD GLUCOSE MONITOR SYSTEM) W/DEVICE KIT, by Does not apply route. Use to check sugar once daily and as needed Dx: E11.9 **ONE  TOUCH VERIO**, Disp: , Rfl:  .  citalopram (CELEXA) 10 MG tablet, Take 1 tablet (10 mg total) by mouth daily., Disp: 30 tablet, Rfl: 11 .  fluticasone (FLONASE) 50 MCG/ACT nasal spray, SPRAY 2 SPRAYS INTO EACH NOSTRIL EVERY DAY, Disp: 16 g, Rfl: 3 .  furosemide (LASIX) 20 MG tablet, TAKE 1 TABLET (20 MG TOTAL) BY MOUTH 2 (TWO) TIMES DAILY AS NEEDED., Disp: 90  tablet, Rfl: 3 .  glimepiride (AMARYL) 2 MG tablet, Take 1 tablet (2 mg total) by mouth daily with breakfast., Disp: 90 tablet, Rfl: 1 .  hydrOXYzine (ATARAX/VISTARIL) 25 MG tablet, TAKE 1 TABLET (25 MG TOTAL) BY MOUTH 2 (TWO) TIMES DAILY AS NEEDED FOR ANXIETY., Disp: 60 tablet, Rfl: 5 .  metFORMIN (GLUCOPHAGE) 1000 MG tablet, Take 1 tablet (1,000 mg total) by mouth 2 (two) times daily with a meal., Disp: 180 tablet, Rfl: 1 .  ONETOUCH VERIO test strip, USE AS INSTRUCTED TO CHECK THREE TIMES DAILY AND AS NEEDED. E11.8, Disp: 300 each, Rfl: 3 .  pantoprazole (PROTONIX) 40 MG tablet, Take 1 tablet (40 mg total) by mouth daily., Disp: 30 tablet, Rfl: 11 .  Probiotic Product (PROBIOTIC DAILY PO), Take by mouth., Disp: , Rfl:  .  simvastatin (ZOCOR) 20 MG tablet, TAKE 1 TABLET (20 MG TOTAL) BY MOUTH EVERY EVENING., Disp: 90 tablet, Rfl: 3 .  tiotropium (SPIRIVA HANDIHALER) 18 MCG inhalation capsule, Place 1 capsule (18 mcg total) into inhaler and inhale daily., Disp: 30 capsule, Rfl: 2  ROS  Constitutional: Denies any fever or chills Gastrointestinal: No reported hemesis, hematochezia, vomiting, or acute GI distress Musculoskeletal: Denies any acute onset joint swelling, redness, loss of ROM, or weakness Neurological: No reported episodes of acute onset apraxia, aphasia, dysarthria, agnosia, amnesia, paralysis, loss of coordination, or loss of consciousness  Allergies  Mr. Lento is allergic to atorvastatin and lisinopril.  PFSH  Drug: Mr. Aversa  reports that he has current or past drug history. Drug: Marijuana. Alcohol:  reports that he does not drink alcohol. Tobacco:  reports that he quit smoking about 5 years ago. His smoking use included cigarettes. He started smoking about 49 years ago. He has a 12.00 pack-year smoking history. He has never used smokeless tobacco. Medical:  has a past medical history of Abnormal drug screen (2015), Angina, Anxiety, Arthritis, CAD (coronary artery disease),  Cannabis abuse, Chronic systolic CHF (congestive heart failure) ([place]) (2013), COPD (chronic obstructive pulmonary disease) ([place]), Diabetes type 2, uncontrolled ([place]), Dilated cardiomyopathy ([place]) (2013), Ex-smoker, Frequent headaches, History of atrial fibrillation (2013), Hyperlipidemia, Hypertension, Migraine, Nonischemic cardiomyopathy ([place]) (2013), Obesity, OSA (obstructive sleep apnea), and Seasonal allergies. Surgical: Mr. Morgenstern  has a past surgical history that includes Cardiac electrophysiology mapping and ablation (2013); US ECHOCARDIOGRAPHY (2014); Cardiovascular stress test (03/2012); and Atrial flutter ablation (N/A, 09/21/2011). Family: family history includes CAD (age of onset: 14) in his maternal grandfather; Cancer (age of onset: 53) in his brother; Cancer (age of onset: 19) in his mother; Diabetes in his other; Heart failure (age of onset: 69) in his father; Hypertension in his maternal grandfather.  Constitutional Exam  General appearance: Well nourished, well developed, and well hydrated. In no apparent acute distress There were no vitals filed for this visit. BMI Assessment: Estimated body mass index is 34.87 kg/m as calculated from the following:   Height as of 01/05/18: _0  (1.803 m).   Weight as of 01/05/18: 250 lb (113.4 kg).  BMI interpretation table: BMI level Category Range association with higher  incidence of chronic pain  <18 kg/m2 Underweight   18.5-24.9 kg/m2 Ideal body weight   25-29.9 kg/m2 Overweight Increased incidence by 20%  30-34.9 kg/m2 Obese (Class I) Increased incidence by 68%  35-39.9 kg/m2 Severe obesity (Class II) Increased incidence by 136%  >40 kg/m2 Extreme obesity (Class III) Increased incidence by 254%   Patient's current BMI Ideal Body weight  There is no height or weight on file to calculate BMI. Patient weight not recorded   BMI Readings from Last 4 Encounters:  01/05/18 34.87 kg/m  12/23/17 36.01 kg/m  11/29/17 37.09 kg/m  10/17/17  38.61 kg/m   Wt Readings from Last 4 Encounters:  01/05/18 250 lb (113.4 kg)  12/23/17 251 lb (113.9 kg)  11/29/17 258 lb 8 oz (117.3 kg)  10/17/17 265 lb 4 oz (120.3 kg)  Psych/Mental status: Alert, oriented x 3 (person, place, & time)       Eyes: PERLA Respiratory: No evidence of acute respiratory distress  Cervical Spine Area Exam  Skin & Axial Inspection: No masses, redness, edema, swelling, or associated skin lesions Alignment: Symmetrical Functional ROM: Unrestricted ROM      Stability: No instability detected Muscle Tone/Strength: Functionally intact. No obvious neuro-muscular anomalies detected. Sensory (Neurological): Unimpaired Palpation: No palpable anomalies              Upper Extremity (UE) Exam    Side: Right upper extremity  Side: Left upper extremity  Skin & Extremity Inspection: Skin color, temperature, and hair growth are WNL. No peripheral edema or cyanosis. No masses, redness, swelling, asymmetry, or associated skin lesions. No contractures.  Skin & Extremity Inspection: Skin color, temperature, and hair growth are WNL. No peripheral edema or cyanosis. No masses, redness, swelling, asymmetry, or associated skin lesions. No contractures.  Functional ROM: Unrestricted ROM          Functional ROM: Unrestricted ROM          Muscle Tone/Strength: Functionally intact. No obvious neuro-muscular anomalies detected.  Muscle Tone/Strength: Functionally intact. No obvious neuro-muscular anomalies detected.  Sensory (Neurological): Unimpaired          Sensory (Neurological): Unimpaired          Palpation: No palpable anomalies              Palpation: No palpable anomalies              Provocative Test(s):  Phalen's test: deferred Tinel's test: deferred Apley's scratch test (touch opposite shoulder):  Action 1 (Across chest): deferred Action 2 (Overhead): deferred Action 3 (LB reach): deferred   Provocative Test(s):  Phalen's test: deferred Tinel's test: deferred Apley's  scratch test (touch opposite shoulder):  Action 1 (Across chest): deferred Action 2 (Overhead): deferred Action 3 (LB reach): deferred    Thoracic Spine Area Exam  Skin & Axial Inspection: No masses, redness, or swelling Alignment: Symmetrical Functional ROM: Unrestricted ROM Stability: No instability detected Muscle Tone/Strength: Functionally intact. No obvious neuro-muscular anomalies detected. Sensory (Neurological): Unimpaired Muscle strength & Tone: No palpable anomalies  Lumbar Spine Area Exam  Skin & Axial Inspection: No masses, redness, or swelling Alignment: Symmetrical Functional ROM: Unrestricted ROM       Stability: No instability detected Muscle Tone/Strength: Functionally intact. No obvious neuro-muscular anomalies detected. Sensory (Neurological): Unimpaired Palpation: No palpable anomalies       Provocative Tests: Hyperextension/rotation test: deferred today       Lumbar quadrant test (Kemp's test): deferred today       Lateral bending test: deferred  today       Patrick's Maneuver: deferred today                   FABER test: deferred today                   S-I anterior distraction/compression test: deferred today         S-I lateral compression test: deferred today         S-I Thigh-thrust test: deferred today         S-I Gaenslen's test: deferred today          Gait & Posture Assessment  Ambulation: Unassisted Gait: Relatively normal for age and body habitus Posture: WNL   Lower Extremity Exam    Side: Right lower extremity  Side: Left lower extremity  Stability: No instability observed          Stability: No instability observed          Skin & Extremity Inspection: Skin color, temperature, and hair growth are WNL. No peripheral edema or cyanosis. No masses, redness, swelling, asymmetry, or associated skin lesions. No contractures.  Skin & Extremity Inspection: Skin color, temperature, and hair growth are WNL. No peripheral edema or cyanosis. No masses,  redness, swelling, asymmetry, or associated skin lesions. No contractures.  Functional ROM: Unrestricted ROM                  Functional ROM: Unrestricted ROM                  Muscle Tone/Strength: Functionally intact. No obvious neuro-muscular anomalies detected.  Muscle Tone/Strength: Functionally intact. No obvious neuro-muscular anomalies detected.  Sensory (Neurological): Unimpaired  Sensory (Neurological): Unimpaired  Palpation: No palpable anomalies  Palpation: No palpable anomalies   Assessment & Plan  Primary Diagnosis & Pertinent Problem List: There were no encounter diagnoses.  Visit Diagnosis: No diagnosis found. Problems updated and reviewed during this visit: No problems updated.  Plan of Care  Pharmacotherapy (Medications Ordered): No orders of the defined types were placed in this encounter.  Procedure Orders    No procedure(s) ordered today   Lab Orders  No laboratory test(s) ordered today   Imaging Orders  No imaging studies ordered today   Referral Orders  No referral(s) requested today    Pharmacological management options:  Opioid Analgesics: We'll take over management today. See above orders Membrane stabilizer: We have discussed the possibility of optimizing this mode of therapy, if tolerated Muscle relaxant: We have discussed the possibility of a trial NSAID: We have discussed the possibility of a trial Other analgesic(s): To be determined at a later time   Interventional management options: Planned, scheduled, and/or pending:    ***   Considering:   ***   PRN Procedures:   None at this time   Provider-requested follow-up: No follow-ups on file.  Future Appointments  Date Time Provider [place]  02/08/2018  8:15 AM Milinda Pointer, [place] None    Primary Care Physician: [name], MD Location: [facility] Note by: Gaspar Cola, MD Date: 02/08/2018; Time: 7:05 AM

## 2018-02-08 ENCOUNTER — Ambulatory Visit: Payer: Medicare Other | Admitting: Pain Medicine

## 2018-02-10 DIAGNOSIS — G4733 Obstructive sleep apnea (adult) (pediatric): Secondary | ICD-10-CM | POA: Diagnosis not present

## 2018-03-13 DIAGNOSIS — G4733 Obstructive sleep apnea (adult) (pediatric): Secondary | ICD-10-CM | POA: Diagnosis not present

## 2018-03-16 ENCOUNTER — Ambulatory Visit (INDEPENDENT_AMBULATORY_CARE_PROVIDER_SITE_OTHER): Payer: Medicare Other

## 2018-03-16 DIAGNOSIS — Z23 Encounter for immunization: Secondary | ICD-10-CM | POA: Diagnosis not present

## 2018-04-12 DIAGNOSIS — G4733 Obstructive sleep apnea (adult) (pediatric): Secondary | ICD-10-CM | POA: Diagnosis not present

## 2018-05-13 DIAGNOSIS — G4733 Obstructive sleep apnea (adult) (pediatric): Secondary | ICD-10-CM | POA: Diagnosis not present

## 2018-05-23 ENCOUNTER — Ambulatory Visit: Payer: Medicare Other | Admitting: Family Medicine

## 2018-05-23 DIAGNOSIS — Z0289 Encounter for other administrative examinations: Secondary | ICD-10-CM

## 2018-05-24 ENCOUNTER — Ambulatory Visit (INDEPENDENT_AMBULATORY_CARE_PROVIDER_SITE_OTHER): Payer: Medicare Other | Admitting: Family Medicine

## 2018-05-24 ENCOUNTER — Encounter: Payer: Self-pay | Admitting: Family Medicine

## 2018-05-24 VITALS — BP 122/80 | HR 75 | Temp 97.8°F | Ht 70.0 in | Wt 260.8 lb

## 2018-05-24 DIAGNOSIS — E1169 Type 2 diabetes mellitus with other specified complication: Secondary | ICD-10-CM | POA: Diagnosis not present

## 2018-05-24 DIAGNOSIS — R51 Headache: Secondary | ICD-10-CM | POA: Diagnosis not present

## 2018-05-24 DIAGNOSIS — R519 Headache, unspecified: Secondary | ICD-10-CM

## 2018-05-24 LAB — HEMOGLOBIN A1C: Hgb A1c MFr Bld: 7.1 % — ABNORMAL HIGH (ref 4.6–6.5)

## 2018-05-24 LAB — CBC WITH DIFFERENTIAL/PLATELET
Basophils Absolute: 0.1 10*3/uL (ref 0.0–0.1)
Basophils Relative: 0.6 % (ref 0.0–3.0)
EOS ABS: 0.3 10*3/uL (ref 0.0–0.7)
Eosinophils Relative: 2.8 % (ref 0.0–5.0)
HCT: 40.1 % (ref 39.0–52.0)
HEMOGLOBIN: 12.9 g/dL — AB (ref 13.0–17.0)
Lymphocytes Relative: 27.8 % (ref 12.0–46.0)
Lymphs Abs: 2.8 10*3/uL (ref 0.7–4.0)
MCHC: 32.2 g/dL (ref 30.0–36.0)
MCV: 79.1 fl (ref 78.0–100.0)
MONO ABS: 1 10*3/uL (ref 0.1–1.0)
Monocytes Relative: 9.8 % (ref 3.0–12.0)
Neutro Abs: 5.9 10*3/uL (ref 1.4–7.7)
Neutrophils Relative %: 59 % (ref 43.0–77.0)
Platelets: 277 10*3/uL (ref 150.0–400.0)
RBC: 5.07 Mil/uL (ref 4.22–5.81)
RDW: 15.4 % (ref 11.5–15.5)
WBC: 10 10*3/uL (ref 4.0–10.5)

## 2018-05-24 LAB — HIGH SENSITIVITY CRP: CRP HIGH SENSITIVITY: 4.69 mg/L (ref 0.000–5.000)

## 2018-05-24 LAB — BASIC METABOLIC PANEL
BUN: 8 mg/dL (ref 6–23)
CO2: 32 meq/L (ref 19–32)
Calcium: 9.3 mg/dL (ref 8.4–10.5)
Chloride: 100 mEq/L (ref 96–112)
Creatinine, Ser: 0.68 mg/dL (ref 0.40–1.50)
GFR: 117.89 mL/min (ref >60.00)
GLUCOSE: 119 mg/dL — AB (ref 70–99)
POTASSIUM: 4.4 meq/L (ref 3.5–5.1)
SODIUM: 140 meq/L (ref 135–145)

## 2018-05-24 LAB — SEDIMENTATION RATE: Sed Rate: 20 mm/hr (ref 0–20)

## 2018-05-24 NOTE — Assessment & Plan Note (Signed)
Update A1c ?
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
## 2018-05-24 NOTE — Patient Instructions (Signed)
Labs today If normal, we will check CT of sinuses.  In the meantime, continue flonase.

## 2018-05-27 ENCOUNTER — Other Ambulatory Visit: Payer: Self-pay | Admitting: Family Medicine

## 2018-05-27 DIAGNOSIS — R51 Headache: Principal | ICD-10-CM

## 2018-05-27 DIAGNOSIS — R519 Headache, unspecified: Secondary | ICD-10-CM

## 2018-05-27 DIAGNOSIS — G4489 Other headache syndrome: Secondary | ICD-10-CM

## 2018-06-05 ENCOUNTER — Ambulatory Visit (INDEPENDENT_AMBULATORY_CARE_PROVIDER_SITE_OTHER): Payer: Medicare Other | Admitting: Family Medicine

## 2018-06-05 ENCOUNTER — Emergency Department: Payer: Medicare Other

## 2018-06-05 ENCOUNTER — Other Ambulatory Visit: Payer: Self-pay

## 2018-06-05 ENCOUNTER — Encounter: Payer: Self-pay | Admitting: Family Medicine

## 2018-06-05 ENCOUNTER — Encounter: Payer: Self-pay | Admitting: Emergency Medicine

## 2018-06-05 ENCOUNTER — Emergency Department
Admission: EM | Admit: 2018-06-05 | Discharge: 2018-06-05 | Disposition: A | Discharge status: Home / Self Care | Payer: Medicare Other | Attending: Emergency Medicine | Admitting: Emergency Medicine

## 2018-06-05 VITALS — BP 120/80 | HR 98 | Temp 97.8°F | Ht 70.0 in | Wt 257.0 lb

## 2018-06-05 DIAGNOSIS — R079 Chest pain, unspecified: Secondary | ICD-10-CM | POA: Diagnosis not present

## 2018-06-05 DIAGNOSIS — Z87891 Personal history of nicotine dependence: Secondary | ICD-10-CM | POA: Diagnosis not present

## 2018-06-05 DIAGNOSIS — I110 Hypertensive heart disease with heart failure: Secondary | ICD-10-CM | POA: Diagnosis not present

## 2018-06-05 DIAGNOSIS — I4891 Unspecified atrial fibrillation: Secondary | ICD-10-CM

## 2018-06-05 DIAGNOSIS — R0602 Shortness of breath: Secondary | ICD-10-CM

## 2018-06-05 DIAGNOSIS — F1210 Cannabis abuse, uncomplicated: Secondary | ICD-10-CM | POA: Diagnosis not present

## 2018-06-05 DIAGNOSIS — I4892 Unspecified atrial flutter: Secondary | ICD-10-CM | POA: Diagnosis not present

## 2018-06-05 DIAGNOSIS — R52 Pain, unspecified: Secondary | ICD-10-CM | POA: Diagnosis not present

## 2018-06-05 DIAGNOSIS — I2510 Atherosclerotic heart disease of native coronary artery without angina pectoris: Secondary | ICD-10-CM | POA: Diagnosis not present

## 2018-06-05 DIAGNOSIS — I5022 Chronic systolic (congestive) heart failure: Secondary | ICD-10-CM | POA: Diagnosis not present

## 2018-06-05 DIAGNOSIS — J441 Chronic obstructive pulmonary disease with (acute) exacerbation: Secondary | ICD-10-CM | POA: Insufficient documentation

## 2018-06-05 DIAGNOSIS — I208 Other forms of angina pectoris: Secondary | ICD-10-CM

## 2018-06-05 DIAGNOSIS — Z7984 Long term (current) use of oral hypoglycemic drugs: Secondary | ICD-10-CM | POA: Insufficient documentation

## 2018-06-05 DIAGNOSIS — R000 Tachycardia, unspecified: Secondary | ICD-10-CM | POA: Diagnosis not present

## 2018-06-05 DIAGNOSIS — R0902 Hypoxemia: Secondary | ICD-10-CM | POA: Diagnosis not present

## 2018-06-05 DIAGNOSIS — Z79899 Other long term (current) drug therapy: Secondary | ICD-10-CM | POA: Insufficient documentation

## 2018-06-05 DIAGNOSIS — E119 Type 2 diabetes mellitus without complications: Secondary | ICD-10-CM | POA: Diagnosis not present

## 2018-06-05 DIAGNOSIS — R9431 Abnormal electrocardiogram [ECG] [EKG]: Secondary | ICD-10-CM | POA: Diagnosis not present

## 2018-06-05 DIAGNOSIS — I2089 Other forms of angina pectoris: Secondary | ICD-10-CM

## 2018-06-05 DIAGNOSIS — H5711 Ocular pain, right eye: Secondary | ICD-10-CM | POA: Diagnosis not present

## 2018-06-05 DIAGNOSIS — R0609 Other forms of dyspnea: Secondary | ICD-10-CM

## 2018-06-05 LAB — BASIC METABOLIC PANEL
Anion gap: 6 (ref 5–15)
BUN: 9 mg/dL (ref 8–23)
CHLORIDE: 103 mmol/L (ref 98–111)
CO2: 27 mmol/L (ref 22–32)
Calcium: 9.1 mg/dL (ref 8.9–10.3)
Creatinine, Ser: 0.56 mg/dL — ABNORMAL LOW (ref 0.61–1.24)
GFR calc Af Amer: >60 mL/min (ref >60)
GFR calc non Af Amer: >60 mL/min (ref >60)
Glucose, Bld: 166 mg/dL — ABNORMAL HIGH (ref 70–99)
Potassium: 4.2 mmol/L (ref 3.5–5.1)
Sodium: 136 mmol/L (ref 135–145)

## 2018-06-05 LAB — CBC WITH DIFFERENTIAL/PLATELET
Abs Immature Granulocytes: 0.04 10*3/uL (ref 0.00–0.07)
Basophils Absolute: 0.1 10*3/uL (ref 0.0–0.1)
Basophils Relative: 1 %
Eosinophils Absolute: 0.1 10*3/uL (ref 0.0–0.5)
Eosinophils Relative: 1 %
HCT: 42.4 % (ref 39.0–52.0)
Hemoglobin: 13.3 g/dL (ref 13.0–17.0)
Immature Granulocytes: 0 %
Lymphocytes Relative: 23 %
Lymphs Abs: 2.4 10*3/uL (ref 0.7–4.0)
MCH: 25.1 pg — ABNORMAL LOW (ref 26.0–34.0)
MCHC: 31.4 g/dL (ref 30.0–36.0)
MCV: 80 fL (ref 80.0–100.0)
MONOS PCT: 8 %
Monocytes Absolute: 0.8 10*3/uL (ref 0.1–1.0)
Neutro Abs: 7.4 10*3/uL (ref 1.7–7.7)
Neutrophils Relative %: 67 %
Platelets: 276 10*3/uL (ref 150–400)
RBC: 5.3 MIL/uL (ref 4.22–5.81)
RDW: 14.7 % (ref 11.5–15.5)
WBC: 10.9 10*3/uL — ABNORMAL HIGH (ref 4.0–10.5)
nRBC: 0 % (ref 0.0–0.2)

## 2018-06-05 LAB — TROPONIN I: Troponin I: <0.03 ng/mL (ref <0.03)

## 2018-06-05 MED ORDER — HYDROXYZINE HCL 25 MG PO TABS: ORAL_TABLET | ORAL | 5 refills | Status: DC

## 2018-06-05 MED ORDER — PROCHLORPERAZINE EDISYLATE 10 MG/2ML IJ SOLN
10.0000 mg | Freq: Once | INTRAMUSCULAR | Status: AC
  Administered 2018-06-05: 10 mg via INTRAVENOUS
  Filled 2018-06-05: qty 2

## 2018-06-05 MED ORDER — PREDNISONE 10 MG (21) PO TBPK: ORAL_TABLET | ORAL | 0 refills | Status: DC

## 2018-06-05 MED ORDER — ASPIRIN 81 MG PO CHEW
324.0000 mg | CHEWABLE_TABLET | Freq: Once | ORAL | Status: AC
  Administered 2018-06-05: 324 mg via ORAL

## 2018-06-05 MED ORDER — NITROGLYCERIN 0.4 MG SL SUBL
0.4000 mg | SUBLINGUAL_TABLET | Freq: Once | SUBLINGUAL | Status: AC
  Administered 2018-06-05: 0.4 mg via SUBLINGUAL

## 2018-06-05 NOTE — ED Triage Notes (Signed)
Pt to ER via EMS from PCP office with c/o shortness of breath.  Pt reports chest pain that started after being at PCP office and that it feels like anxiety due to coming to ER.  Pt denies pain to chest, arms or back prior to being at PCP office.

## 2018-06-05 NOTE — Progress Notes (Signed)
Dr. Frederico Hamman T. Mayme Profeta, MD, [facility] [place], [address] Phone: [phone] Fax: [phone]  06/05/2018  Patient: [name], MRN: [id number], DOB: [birth year], 63 y.o.  Primary Physician:  [name], MD   Chief Complaint  Patient presents with  . Shortness of Breath    x 2 days   Subjective:   DEMONTEZ NOVACK is a 63 y.o. very pleasant male patient who presents with the following:  63 year old patient with a history of congestive heart failure, history of smoking, and a history of A. fib status post ablation by Dr. Rayann Heman.  His primary cardiologist is Dr. Nehemiah Massed a [facility].  Most recent ejection fraction is 50%.  He is having SOB and CP with ambulation of approx 30 feet.  Normally he can easily walk 100 feet.  He is having current CP at rest in the office.  Sweating currently.  I cannot find any pulmonary function tests or spirometry. Approx 12 pack year smoking history, quit 2014.  ? Had a sinus infection.   Now out of no where took a few steps and got out of breath.  Has had an ablation in the past by Dr. Rayann Heman.  Does have a flutter in his chest.   He does appear little bit anxious today in the office.  Been having some shortness of breath with exertion, and he thought he may be is having some fluttering in his chest similar to his prior A. fib.  He did have a successful ablation previously.  He is not having any swelling particularly.  He is scheduled to have a maxillofacial CT done tomorrow without contrast given his ongoing facial pain.  2/19 last OV with Dr. Nehemiah Massed and stress echo.  Normal. It looks like he actually wanted him to follow-up in April 2019, but that never happened.  Past Medical History, Surgical History, Social History, Family History, Problem List, Medications, and Allergies have been reviewed and updated if relevant.  Patient Active Problem List   Diagnosis Date Noted  . CHF (congestive heart failure) ([place]) 01/05/2018  . Pain in right eye (Primary Area of Pain) 01/05/2018  . Right sided facial pain (Secondary Area of Pain) 01/05/2018  . Chronic neck pain [facility] Area of Pain) (R>L) 01/05/2018  . Chronic upper extremity pain (Fourth Area of Pain) (R>L) 01/05/2018  . Chronic upper back pain 01/05/2018  . Chronic bilateral low back pain without sciatica 01/05/2018  . Chronic pain syndrome 01/05/2018  . Pharmacologic therapy 01/05/2018  . Disorder of skeletal system 01/05/2018  . Problems influencing health status 01/05/2018  . Right-sided headache 10/17/2017  . Right leg weakness 07/14/2017  . AAA (abdominal aortic aneurysm) without rupture ([place]) 09/15/2016  . Adrenal adenoma, left 08/13/2016  . Nodule of right lung 08/13/2016  . Aortic dilatation ([place]) 08/13/2016  . Fatty liver 08/01/2016  . Right flank pain 07/26/2016  . 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 Right shoulder pain 07/26/2016  . Restrictive lung disease 06/04/2016  . Chronic systolic CHF (congestive heart failure) ([place])   . Tinea pedis 06/30/2015  . Gassiness 02/21/2015  . Acute sinusitis 01/21/2015  . Advanced care planning/counseling discussion 10/16/2014  . Health maintenance examination 10/16/2014  . Type II diabetes mellitus ([place]) 10/25/2013  . SOB (shortness of breath) 10/08/2013  . Medicare annual wellness visit, subsequent 09/03/2013  . Umbilical hernia without obstruction and without gangrene 09/03/2013  . Left flank mass 09/03/2013  . Mixed  hyperlipidemia 06/29/2013  . Coronary artery disease 06/29/2013  . Abnormal toxicological findings 06/24/2013  . Exertional dyspnea 06/14/2013  . Other dyspnea and respiratory abnormality 06/14/2013  . Malaise and fatigue 03/14/2013  . Chest pain 02/07/2013  . Hypertension   . Panic attacks   . Atrial fibrillation and flutter ([place]) 07/07/2011  . Morbid obesity ([place]) 07/07/2011  . Ex-smoker 08/13/2009    . Sleep apnea, central 08/13/2009  . DDD (degenerative disc disease), cervical 08/13/2009  . Allergic rhinitis 06/24/2009    Past Medical History:  Diagnosis Date  . Abnormal drug screen 2015   MJ positive x3 - one more and we will stop prescribing ativan (08/2013).  . Angina   . Anxiety   . Arthritis   . CAD (coronary artery disease)    nonobstructive  . Cannabis abuse   . Chronic systolic CHF (congestive heart failure) ([place]) 2013   NYHA Class II/III  . COPD (chronic obstructive pulmonary disease) ([place])   . Diabetes type 2, uncontrolled ([place])    declines DSME  . Dilated cardiomyopathy ([place]) 2013   now improved  . Ex-smoker   . Frequent headaches   . History of atrial fibrillation 2013   chronic, s/p ablation prior on coumadin  . Hyperlipidemia   . Hypertension   . Migraine   . Nonischemic cardiomyopathy ([place]) 2013   EF 25% per Dr Nehemiah Massed  . Obesity   . OSA (obstructive sleep apnea)    does not use CPAP - unable to tolerate  . Seasonal allergies     Past Surgical History:  Procedure Laterality Date  . ATRIAL FLUTTER ABLATION N/A 09/21/2011   Procedure: ATRIAL FLUTTER ABLATION;  Surgeon: Thompson Grayer, MD;  Location: [facility] CATH LAB;  Service: Cardiovascular;  Laterality: N/A;  . CARDIAC ELECTROPHYSIOLOGY [place] AND ABLATION  2013   for atrial flutter  . CARDIOVASCULAR STRESS TEST  03/2012   ETT WNL Nehemiah Massed)  . US ECHOCARDIOGRAPHY  2014   EF 50%, nl LV fxn, RV nl size/function, mild mitral insuff    Social History   Socioeconomic History  . Marital status: Single    Spouse name: Not on file  . Number of children: 0  . Years of education: Not on file  . Highest education level: Not on file  Occupational History  . Occupation: disable   Social Needs  . Financial resource strain: Not on file  . Food insecurity:    Worry: Not on file    Inability: Not on file  . Transportation needs:    Medical: Not on file    Non-medical: Not on file  Tobacco Use  .  Smoking status: Former Smoker    Packs/day: 1.00    Years: 12.00    Pack years: 12.00    Types: Cigarettes    Start date: 04/26/1968    Last attempt to quit: 04/26/2012    Years since quitting: 6.1  . Smokeless tobacco: Never Used  Substance and Sexual Activity  . Alcohol use: No    Alcohol/week: 0.0 standard drinks  . Drug use: Yes    Types: Marijuana  . Sexual activity: Yes  Lifestyle  . Physical activity:    Days per week: Not on file    Minutes per session: Not on file  . Stress: Not on file  Relationships  . Social connections:    Talks on phone: Not on file    Gets together: Not on file    Attends religious service: Not on file  Active member of club or organization: Not on file    Attends meetings of clubs or organizations: Not on file    Relationship status: Not on file  . Intimate partner violence:    Fear of current or ex partner: Not on file    Emotionally abused: Not on file    Physically abused: Not on file    Forced sexual activity: Not on file  Other Topics Concern  . 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 Heart failure Father 14  . Cancer Mother 45       NHL  . Hypertension Maternal Grandfather   . CAD Maternal Grandfather 63       MI  . Diabetes Other        grandparents  . Cancer Brother 49       lung (smoker)    Allergies  Allergen Reactions  . Atorvastatin Other (See Comments)    Dizziness  . Lisinopril Cough    Medication list reviewed and updated in full in [facility].  GEN: No acute illnesses, no fevers, chills. GI: No n/v/d, eating normally Pulm: + SOB Interactive and getting along well at home. Otherwise, the pertinent  positives and negatives are listed above and in the HPI, otherwise a full review of systems has been reviewed and is negative unless noted positive.   Objective:   BP 120/80   Pulse 98   Temp 97.8 F (36.6 C) (Oral)   Ht _0  (1.778 m)   Wt 257 lb (116.6 kg)   SpO2 95%   BMI 36.88 kg/m   GEN: WDWN, NAD, Non-toxic, A & O x 3 HEENT: Atraumatic, Normocephalic. Neck supple. No masses, No LAD. Ears and Nose: No external deformity. TM clear, mild R max sinus tenderness. CV: sinus tach to 100, No M/G/R. No JVD. No thrill. No extra heart sounds. PULM: CTA B, no wheezes, crackles, rhonchi. No retractions. No resp. distress. No accessory muscle use. ABD: S, NT, ND, + BS, No rebound, No HSM  EXTR: No c/c/tr LE edema NEURO Normal gait.  PSYCH: Normally interactive. Conversant. Not depressed or anxious appearing.  Calm demeanor.   Laboratory and Imaging Data: Results for orders placed or performed in visit on 05/24/18  Sedimentation rate  Result Value Ref Range   Sed Rate 20 0 - 20 mm/hr  High sensitivity CRP  Result Value Ref Range   CRP, High Sensitivity 4.690 0.000 - 5.000 mg/L  CBC with Differential/Platelet  Result Value Ref Range   WBC 10.0 4.0 - 10.5 K/uL   RBC 5.07 4.22 - 5.81 Mil/uL   Hemoglobin 12.9 (L) 13.0 - 17.0 g/dL   HCT 40.1 39.0 - 52.0 %   MCV 79.1 78.0 - 100.0 fl   MCHC 32.2 30.0 - 36.0 g/dL   RDW 15.4 11.5 - 15.5 %   Platelets 277.0 150.0 - 400.0 K/uL   Neutrophils Relative % 59.0 43.0 - 77.0 %   Lymphocytes Relative 27.8 12.0 - 46.0 %   Monocytes Relative 9.8 3.0 - 12.0 %   Eosinophils Relative 2.8 0.0 - 5.0 %   Basophils Relative 0.6 0.0 - 3.0 %  Neutro Abs 5.9 1.4 - 7.7 K/uL   Lymphs Abs 2.8 0.7 - 4.0 K/uL   Monocytes Absolute 1.0 0.1 - 1.0 K/uL   Eosinophils Absolute 0.3 0.0 - 0.7 K/uL   Basophils Absolute 0.1 0.0 - 0.1 K/uL  Basic metabolic panel  Result Value Ref Range   Sodium 140 135 - 145 mEq/L   Potassium 4.4 3.5 - 5.1 mEq/L   Chloride 100 96  - 112 mEq/L   CO2 32 19 - 32 mEq/L   Glucose, Bld 119 (H) 70 - 99 mg/dL   BUN 8 6 - 23 mg/dL   Creatinine, Ser 0.68 0.40 - 1.50 mg/dL   Calcium 9.3 8.4 - 10.5 mg/dL   GFR 117.89 >60.00 mL/min  Hemoglobin A1c  Result Value Ref Range   Hgb A1c MFr Bld 7.1 (H) 4.6 - 6.5 %     Assessment and Plan:   Angina at rest [facility])  Atrial fibrillation and flutter (HCC) - Plan: EKG 23-FTDD  Chronic systolic CHF (congestive heart failure) (HCC)  History of tobacco abuse  Chest pain, unspecified type - Plan: nitroGLYCERIN (NITROSTAT) SL tablet 0.4 mg  Dyspnea on exertion  MDM in this case is high, potentially life threatening event  New onset DOE and CP in patient with CAD Placed on 2 L 02 ASA 365 mg given in office 0.4 mg SL NTG given in office.  EKG: Normal sinus tach to 100. Normal axis, normal R wave progression, concern for new II, III, avL ST depression not seen on prior EKG's.  ? p wave inversion at v6.  High risk patient.  Chest pain at rest.  Worsened dyspnea on exertion over the last 48 hours.  Decreased from baseline. Concern for EKG changes and potential inferior ischemia on EKG.  Concern for ongoing angina.  Emergent EMS activation, he will be sent to [facility] for emergent evaluation of ongoing event.  We appreciate their help.  He is Dr. Derrick Ravel patient - primary cardiologist.  Meds ordered this encounter  Medications  . hydrOXYzine (ATARAX/VISTARIL) 25 MG tablet    Sig: TAKE 1 TABLET (25 MG TOTAL) BY MOUTH 2 (TWO) TIMES DAILY AS NEEDED FOR ANXIETY.    Dispense:  60 tablet    Refill:  5  . nitroGLYCERIN (NITROSTAT) SL tablet 0.4 mg   Orders Placed This Encounter  Procedures  . EKG 12-Lead    Signed,  Ranny Wiebelhaus T. Karson Chicas, MD   Outpatient Encounter Medications as of 06/05/2018  Medication Sig  . ACCU-CHEK FASTCLIX LANCETS MISC Check blood sugar once daily and as instructed. Dx 250.00  . aspirin EC 81 MG tablet Take 81 mg by  mouth daily.  . Blood Glucose Monitoring Suppl (BLOOD GLUCOSE MONITOR SYSTEM) W/DEVICE KIT by Does not apply route. Use to check sugar once daily and as needed Dx: E11.9 **ONE TOUCH VERIO**  . citalopram (CELEXA) 10 MG tablet Take 1 tablet (10 mg total) by mouth daily.  . fluticasone (FLONASE) 50 MCG/ACT nasal spray SPRAY 2 SPRAYS INTO EACH NOSTRIL EVERY DAY  . furosemide (LASIX) 20 MG tablet TAKE 1 TABLET (20 MG TOTAL) BY MOUTH 2 (TWO) TIMES DAILY AS NEEDED.  Marland Kitchen glimepiride (AMARYL) 2 MG tablet Take 1 tablet (2 mg total) by mouth daily with breakfast.  . hydrOXYzine (ATARAX/VISTARIL) 25 MG tablet TAKE 1 TABLET (25 MG TOTAL) BY MOUTH 2 (TWO) TIMES DAILY AS NEEDED FOR ANXIETY.  . metFORMIN (GLUCOPHAGE) 1000 MG tablet Take 1 tablet (1,000 mg total) by mouth  2 (two) times daily with a meal.  . ONETOUCH VERIO test strip USE AS INSTRUCTED TO CHECK THREE TIMES DAILY AND AS NEEDED. E11.8  . pantoprazole (PROTONIX) 40 MG tablet Take 1 tablet (40 mg total) by mouth daily.  . Probiotic Product (PROBIOTIC DAILY PO) Take by mouth.  . simvastatin (ZOCOR) 20 MG tablet TAKE 1 TABLET (20 MG TOTAL) BY MOUTH EVERY EVENING.  Marland Kitchen tiotropium (SPIRIVA HANDIHALER) 18 MCG inhalation capsule Place 1 capsule (18 mcg total) into inhaler and inhale daily.  . [DISCONTINUED] hydrOXYzine (ATARAX/VISTARIL) 25 MG tablet TAKE 1 TABLET (25 MG TOTAL) BY MOUTH 2 (TWO) TIMES DAILY AS NEEDED FOR ANXIETY.  . [EXPIRED] nitroGLYCERIN (NITROSTAT) SL tablet 0.4 mg    No facility-administered encounter medications on file as of 06/05/2018.

## 2018-06-05 NOTE — Discharge Instructions (Addendum)
Please seek medical attention for any high fevers, chest pain, shortness of breath, change in behavior, persistent vomiting, bloody stool or any other new or concerning symptoms.  

## 2018-06-05 NOTE — Addendum Note (Signed)
Addended by: Carter Kitten on: 06/05/2018 03:39 PM   Modules accepted: Orders

## 2018-06-05 NOTE — ED Provider Notes (Signed)
[facility] Emergency Department Provider Note   ____________________________________________   I have reviewed the triage vital signs and the nursing notes.   HISTORY  Chief Complaint Shortness of Breath and Chest Pain   History limited by: Not Limited   HPI [name] is a 63 y.o. male who presents to the emergency department today via EMS from primary care provider's office because of concerns for EKG changes.  The patient went to the primary care doctor's office today because of concerns for pain behind his right eye.  He states that he has had sinus issues in the past and this reminded him of it.  He has tried some Flonase at home with some relief.  In addition to the eye pain he has been complaining of some shortness of breath.  This is been going on for the past few days as well.  He does have a history of COPD and has tried his inhaler without any significant relief. The patient denies any significant chest pain. Has felt some sensations that remind him of when he has had afib in the past. The patient denies any fevers.   Per medical record review patient has a history of COPD, CAD.   Past Medical History:  Diagnosis Date  . Abnormal drug screen 2015   MJ positive x3 - one more and we will stop prescribing ativan (08/2013).  . Angina   . Anxiety   . Arthritis   . CAD (coronary artery disease)    nonobstructive  . Cannabis abuse   . Chronic systolic CHF (congestive heart failure) ([place]) 2013   NYHA Class II/III  . COPD (chronic obstructive pulmonary disease) ([place])   . Diabetes type 2, uncontrolled ([place])    declines DSME  . Dilated cardiomyopathy ([place]) 2013   now improved  . Ex-smoker   . Frequent headaches   . History of atrial fibrillation 2013   chronic, s/p ablation prior on coumadin  . Hyperlipidemia   . Hypertension   . Migraine   . Nonischemic cardiomyopathy ([place]) 2013   EF 25% per Dr Nehemiah Massed  . Obesity   . OSA (obstructive  sleep apnea)    does not use CPAP - unable to tolerate  . Seasonal allergies     Patient Active Problem List   Diagnosis Date Noted  . CHF (congestive heart failure) ([place]) 01/05/2018  . Pain in right eye (Primary Area of Pain) 01/05/2018  . Right sided facial pain (Secondary Area of Pain) 01/05/2018  . Chronic neck pain [facility] Area of Pain) (R>L) 01/05/2018  . Chronic upper extremity pain (Fourth Area of Pain) (R>L) 01/05/2018  . Chronic upper back pain 01/05/2018  . Chronic bilateral low back pain without sciatica 01/05/2018  . Chronic pain syndrome 01/05/2018  . Pharmacologic therapy 01/05/2018  . Disorder of skeletal system 01/05/2018  . Problems influencing health status 01/05/2018  . Right-sided headache 10/17/2017  . Right leg weakness 07/14/2017  . AAA (abdominal aortic aneurysm) without rupture ([place]) 09/15/2016  . Adrenal adenoma, left 08/13/2016  . Nodule of right lung 08/13/2016  . Aortic dilatation ([place]) 08/13/2016  . Fatty liver 08/01/2016  . Right flank pain 07/26/2016  . 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 Right shoulder pain 07/26/2016  . Restrictive lung disease 06/04/2016  . Chronic systolic CHF (congestive heart failure) ([place])   . Tinea pedis 06/30/2015  . Gassiness 02/21/2015  . Acute sinusitis 01/21/2015  . Advanced care planning/counseling discussion 10/16/2014  . Health maintenance  examination 10/16/2014  . Type II diabetes mellitus ([place]) 10/25/2013  . SOB (shortness of breath) 10/08/2013  . Medicare annual wellness visit, subsequent 09/03/2013  . Umbilical hernia without obstruction and without gangrene 09/03/2013  . Left flank mass 09/03/2013  . Mixed hyperlipidemia 06/29/2013  . Coronary artery disease 06/29/2013  . Abnormal toxicological findings 06/24/2013  . Exertional dyspnea 06/14/2013  . Other dyspnea and respiratory abnormality 06/14/2013  . Malaise and fatigue 03/14/2013  . Chest pain 02/07/2013  . Hypertension   . Panic  attacks   . Atrial fibrillation and flutter ([place]) 07/07/2011  . Morbid obesity ([place]) 07/07/2011  . Ex-smoker 08/13/2009  . Sleep apnea, central 08/13/2009  . DDD (degenerative disc disease), cervical 08/13/2009  . Allergic rhinitis 06/24/2009    Past Surgical History:  Procedure Laterality Date  . ATRIAL FLUTTER ABLATION N/A 09/21/2011   Procedure: ATRIAL FLUTTER ABLATION;  Surgeon: Thompson Grayer, MD;  Location: [facility] CATH LAB;  Service: Cardiovascular;  Laterality: N/A;  . CARDIAC ELECTROPHYSIOLOGY [place] AND ABLATION  2013   for atrial flutter  . CARDIOVASCULAR STRESS TEST  03/2012   ETT WNL Nehemiah Massed)  . US ECHOCARDIOGRAPHY  2014   EF 50%, nl LV fxn, RV nl size/function, mild mitral insuff    Prior to Admission medications   Medication Sig Start Date End Date Taking? Authorizing Provider  ACCU-CHEK FASTCLIX LANCETS MISC Check blood sugar once daily and as instructed. Dx 250.00 05/10/13   [name], MD  aspirin EC 81 MG tablet Take 81 mg by mouth daily.    [provider]  Blood Glucose Monitoring Suppl (BLOOD GLUCOSE MONITOR SYSTEM) W/DEVICE KIT by Does not apply route. Use to check sugar once daily and as needed Dx: E11.9 **ONE TOUCH VERIO**    [provider]  citalopram (CELEXA) 10 MG tablet Take 1 tablet (10 mg total) by mouth daily. 10/17/17   [name], MD  fluticasone [facility]) 50 MCG/ACT nasal spray SPRAY 2 SPRAYS INTO EACH NOSTRIL EVERY DAY 11/09/17   [name], MD  furosemide (LASIX) 20 MG tablet TAKE 1 TABLET (20 MG TOTAL) BY MOUTH 2 (TWO) TIMES DAILY AS NEEDED. 11/03/17   [name], MD  glimepiride (AMARYL) 2 MG tablet Take 1 tablet (2 mg total) by mouth daily with breakfast. 11/01/17   [name], MD  hydrOXYzine (ATARAX/VISTARIL) 25 MG tablet TAKE 1 TABLET (25 MG TOTAL) BY MOUTH 2 (TWO) TIMES DAILY AS NEEDED FOR ANXIETY. 06/05/18   Cop[place], Frederico Hamman, MD  metFORMIN (GLUCOPHAGE) 1000 MG tablet Take 1 tablet (1,000 mg total) by  mouth 2 (two) times daily with a meal. 01/19/18   [name], MD  [facility] VERIO test strip USE AS INSTRUCTED TO CHECK THREE TIMES DAILY AND AS NEEDED. E11.8 11/22/17   [name], MD  pantoprazole (PROTONIX) 40 MG tablet Take 1 tablet (40 mg total) by mouth daily. 10/17/17   [name], MD  Probiotic Product (PROBIOTIC DAILY PO) Take by mouth.    [provider]  simvastatin (ZOCOR) 20 MG tablet TAKE 1 TABLET (20 MG TOTAL) BY MOUTH EVERY EVENING. 12/09/17   [name], MD  tiotropium (SPIRIVA HANDIHALER) 18 MCG inhalation capsule Place 1 capsule (18 mcg total) into inhaler and inhale daily. 06/24/17 06/24/18  Laverle Hobby, MD    Allergies Atorvastatin and Lisinopril  Family History  Problem Relation Age of Onset  . Heart failure Father 57  . Cancer Mother 47       NHL  . Hypertension Maternal Grandfather   .  CAD Maternal Grandfather 18       MI  . Diabetes Other        grandparents  . Cancer Brother 24       lung (smoker)    Social History Social History   Tobacco Use  . Smoking status: Former Smoker    Packs/day: 1.00    Years: 12.00    Pack years: 12.00    Types: Cigarettes    Start date: 04/26/1968    Last attempt to quit: 04/26/2012    Years since quitting: 6.1  . Smokeless tobacco: Never Used  Substance Use Topics  . Alcohol use: No    Alcohol/week: 0.0 standard drinks  . Drug use: Yes    Types: Marijuana    Review of Systems Constitutional: No fever/chills Eyes: Positive for pain behind his right eye. ENT: No sore throat. Cardiovascular: Denies chest pain. Respiratory: Positive for shortness of breath. Gastrointestinal: No abdominal pain.  No nausea, no vomiting.  No diarrhea.   Genitourinary: Negative for dysuria. Musculoskeletal: Negative for back pain. Skin: Negative for rash. Neurological: Negative for headaches, focal weakness or numbness.  ____________________________________________   PHYSICAL EXAM:  VITAL  SIGNS: ED Triage Vitals  Enc Vitals Group     BP 06/05/18 1608 (!) 169/92     Pulse Rate 06/05/18 1608 97     Resp 06/05/18 1608 20     Temp 06/05/18 1609 97.8 F (36.6 C)     Temp src --      SpO2 06/05/18 1608 95 %     Weight 06/05/18 1610 257 lb (116.6 kg)     Height 06/05/18 1610 6' (1.829 m)     Head Circumference --      Peak Flow --      Pain Score 06/05/18 1609 0   Constitutional: Alert and oriented.  Eyes: Conjunctivae are normal.  ENT      Head: Normocephalic and atraumatic.      Nose: No congestion/rhinnorhea.      Mouth/Throat: Mucous membranes are moist.      Neck: No stridor. Hematological/Lymphatic/Immunilogical: No cervical lymphadenopathy. Cardiovascular: Normal rate, regular rhythm.  No murmurs, rubs, or gallops.  Respiratory: Normal respiratory effort without tachypnea nor retractions. Breath sounds are clear and equal bilaterally. No wheezes/rales/rhonchi. Gastrointestinal: Soft and non tender. No rebound. No guarding.  Genitourinary: Deferred Musculoskeletal: Normal range of motion in all extremities. No lower extremity edema. Neurologic:  Normal speech and language. No gross focal neurologic deficits are appreciated.  Skin:  Skin is warm, dry and intact. No rash noted. Psychiatric: Mood and affect are normal. Speech and behavior are normal. Patient exhibits appropriate insight and judgment.  ____________________________________________    LABS (pertinent positives/negatives) Trop <0.03 CBC wbc 10.9, hgb 13.3, plt 276 BMP wnl except glu 166, cr 0.56  ____________________________________________   EKG  I, Nance Pear, attending physician, personally viewed and interpreted this EKG  EKG Time: 1608 Rate: 98 Rhythm: sinus rhythm Axis: right axis deviation Intervals: qtc 445 QRS: narrow ST changes: no st elevation Impression: abnormal ekg   ____________________________________________    RADIOLOGY  CXR No acute  findings  ____________________________________________   PROCEDURES  Procedures  ____________________________________________   INITIAL IMPRESSION / ASSESSMENT AND PLAN / ED COURSE  Pertinent labs & imaging results that were available during my care of the patient were reviewed by me and considered in my medical decision making (see chart for details).   Patient presented to the emergency department today from primary care doctor's  office because of concern for EKG changes.  He was able EKG here without any ST elevation.  QRS complex was narrow.  Patient work-up here without any concerning findings.  He has a slight COPD exacerbation causing some of his reported shortness of breath.  Do think patient is safe for discharge.  Will give patient prescription for prednisone.  Discussed with patient continue use of inhaler.  ____________________________________________   FINAL CLINICAL IMPRESSION(S) / ED DIAGNOSES  Final diagnoses:  Shortness of breath  COPD exacerbation ([place])     Note: This dictation was prepared with Dragon dictation. Any transcriptional errors that result from this process are unintentional     Nance Pear, MD 06/05/18 1902

## 2018-06-06 ENCOUNTER — Telehealth: Payer: Self-pay | Admitting: Family Medicine

## 2018-06-06 ENCOUNTER — Ambulatory Visit: Admission: RE | Admit: 2018-06-06 | Payer: Medicare Other | Source: Ambulatory Visit

## 2018-06-06 NOTE — Telephone Encounter (Signed)
Received a call from the patient asking me to cancel the CT Maxillofacial that was scheduled for today. Patient saw Dr Lorelei Pont yesterday and was sent to the ED by EMS and he doesn't fee l good so asked me to cancel the Appt for today. He said he will call back to get rescheduled.

## 2018-06-06 NOTE — Telephone Encounter (Signed)
noted 

## 2018-06-13 ENCOUNTER — Other Ambulatory Visit: Payer: Self-pay | Admitting: Family Medicine

## 2018-06-15 ENCOUNTER — Telehealth: Payer: Self-pay

## 2018-06-15 NOTE — Telephone Encounter (Signed)
Noted thanks °

## 2018-06-15 NOTE — Telephone Encounter (Signed)
Pt was seen at [facility] ED on 06/05/18; pt continues with difficulty breathing upon moving around. Sitting still pt has no problem. Prednisone has helped breathing somewhat. No CP. Pt refuses an appt today only wants to see Dr Darnell Level. Pt scheduled 30' appt with Dr Darnell Level 06/16/18 at 12 noon. Pt wants to know if should restart carvedilol; pt stopped 04/2017 because he did not want to take med he did not need and wanted to know purpose of med; advised give med to help with blood flow with CHF and hypertension. Pt will discuss with Dr Darnell Level if should restart carvedilol at appt on 06/16/18. If pt condition changes or worsens prior to appt pt should go to UC or ED. Pt voiced understanding. FYI to Dr Darnell Level and Dr Damita Dunnings who is in office.

## 2018-06-16 ENCOUNTER — Other Ambulatory Visit: Payer: Self-pay | Admitting: Family Medicine

## 2018-06-16 ENCOUNTER — Ambulatory Visit (INDEPENDENT_AMBULATORY_CARE_PROVIDER_SITE_OTHER): Payer: Medicare Other | Admitting: Family Medicine

## 2018-06-16 ENCOUNTER — Encounter: Payer: Self-pay | Admitting: Family Medicine

## 2018-06-16 VITALS — BP 124/70 | HR 78 | Temp 98.2°F | Ht 70.0 in | Wt 263.4 lb

## 2018-06-16 DIAGNOSIS — J984 Other disorders of lung: Secondary | ICD-10-CM

## 2018-06-16 DIAGNOSIS — E1169 Type 2 diabetes mellitus with other specified complication: Secondary | ICD-10-CM

## 2018-06-16 DIAGNOSIS — R51 Headache: Secondary | ICD-10-CM

## 2018-06-16 DIAGNOSIS — G4731 Primary central sleep apnea: Secondary | ICD-10-CM

## 2018-06-16 DIAGNOSIS — I5022 Chronic systolic (congestive) heart failure: Secondary | ICD-10-CM

## 2018-06-16 DIAGNOSIS — Z87891 Personal history of nicotine dependence: Secondary | ICD-10-CM | POA: Diagnosis not present

## 2018-06-16 DIAGNOSIS — R0609 Other forms of dyspnea: Secondary | ICD-10-CM | POA: Diagnosis not present

## 2018-06-16 DIAGNOSIS — F410 Panic disorder [episodic paroxysmal anxiety] without agoraphobia: Secondary | ICD-10-CM

## 2018-06-16 DIAGNOSIS — R519 Headache, unspecified: Secondary | ICD-10-CM

## 2018-06-16 LAB — BRAIN NATRIURETIC PEPTIDE: Pro B Natriuretic peptide (BNP): 11 pg/mL (ref 0.0–100.0)

## 2018-06-16 MED ORDER — HYDROXYZINE HCL 50 MG PO TABS: 50.0000 mg | ORAL_TABLET | Freq: Two times a day (BID) | ORAL | 1 refills | Status: DC | PRN

## 2018-06-16 MED ORDER — MONTELUKAST SODIUM 10 MG PO TABS: 10.0000 mg | ORAL_TABLET | Freq: Every day | ORAL | 11 refills | Status: DC

## 2018-06-16 MED ORDER — ALBUTEROL SULFATE (2.5 MG/3ML) 0.083% IN NEBU
2.5000 mg | INHALATION_SOLUTION | Freq: Once | RESPIRATORY_TRACT | Status: AC
  Administered 2018-06-16: 2.5 mg via RESPIRATORY_TRACT

## 2018-06-16 MED ORDER — CARVEDILOL 6.25 MG PO TABS: ORAL_TABLET | ORAL | 1 refills | Status: DC

## 2018-06-16 NOTE — Assessment & Plan Note (Signed)
Ongoing facial pain - will reschedule maxillofacial CT.

## 2018-06-16 NOTE — Assessment & Plan Note (Signed)
Check BNP 

## 2018-06-16 NOTE — Assessment & Plan Note (Signed)
Now back to regularly using CPAP. Encouraged cards or pulm f/u.

## 2018-06-16 NOTE — Assessment & Plan Note (Addendum)
Off and on smoker, last cigarette was 1 month ago. Encouraged full cessation.

## 2018-06-16 NOTE — Assessment & Plan Note (Addendum)
Increase hydroxyzine to 50mg  PRN. Continue celexa.

## 2018-06-16 NOTE — Telephone Encounter (Signed)
Noted. Thanks.  I'll defer to patient.  

## 2018-06-16 NOTE — Assessment & Plan Note (Signed)
Presumed COPD, may suggest return to pulm.

## 2018-06-16 NOTE — Progress Notes (Signed)
BP 124/70 (BP Location: Left Arm, Patient Position: Sitting, Cuff Size: Large)   Pulse 78   Temp 98.2 F (36.8 C) (Oral)   Ht '5\' 10"'  (1.778 m)   Wt 263 lb 7 oz (119.5 kg)   SpO2 97%   BMI 37.80 kg/m    CC: ER f/u visit Subjective:    Patient ID: [name], male    DOB: May 14, 1955, 63 y.o.   MRN: [id number]  HPI: [name] is a 63 y.o. male presenting on 06/16/2018 for Hospitalization Follow-up (Seen at [facility] ED on 06/05/18. )   See prior notes for details.  Saw Dr Lorelei Pont 2/10 with chest pain, dyspnea, referred to ER that day. Seen at ER where EKG was reassuring, TnI x1 normal. Treated for COPD exacerbation with prednisone course. Prednisone helped but symptoms persist - predominantly dyspnea with mild exertion, not at rest. Chest tightness with exertion. Mild cough, no significant wheezing. Normally sleeps with 3 pillows at night. Denies leg swelling. Regularly taking lasix 34m bid.   Last saw pulm (Ashby Dawes 06/2017. Never completed PFTs. Last saw [place] cards 07/2017. OSA on CPAP - overdue for f/u. Unclear if followed by pulm or cards. New mask since last month (dog ate old mask).   Quit smoking 2014. Several relapses since. Last cigarette was >1 mo ago. Does smoke marijuana.  He stopped carvedilol at least 6 months ago. Didn't feel it was making any difference. Dyspnea may have worsened after this.   Ongoing facial pain, CT scan still pending - had to reschedule due to feeling ill.      Relevant past medical, surgical, family and social history reviewed and updated as indicated. Interim medical history since our last visit reviewed. Allergies and medications reviewed and updated. Outpatient Medications Prior to Visit  Medication Sig Dispense Refill  . ACCU-CHEK FASTCLIX LANCETS MISC Check blood sugar once daily and as instructed. Dx 250.00 100 each 3  . aspirin EC 81 MG tablet Take 81 mg by mouth daily.    . Blood Glucose Monitoring Suppl (BLOOD GLUCOSE  MONITOR SYSTEM) W/DEVICE KIT by Does not apply route. 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 citalopram (CELEXA) 10 MG tablet Take 1 tablet (10 mg total) by mouth daily. 30 tablet 11  . fluticasone (FLONASE) 50 MCG/ACT nasal spray SPRAY 2 SPRAYS INTO EACH NOSTRIL EVERY DAY 16 g 3  . furosemide (LASIX) 20 MG tablet TAKE 1 TABLET (20 MG TOTAL) BY MOUTH 2 (TWO) TIMES DAILY AS NEEDED. 90 tablet 3  . glimepiride (AMARYL) 2 MG tablet Take 1 tablet (2 mg total) by mouth daily with breakfast. 90 tablet 1  . metFORMIN (GLUCOPHAGE) 1000 MG tablet Take 1 tablet (1,000 mg total) by mouth 2 (two) times daily with a meal. 180 tablet 1  . ONETOUCH VERIO test strip USE AS INSTRUCTED TO CHECK THREE TIMES DAILY AND AS NEEDED. E11.8 300 each 3  . pantoprazole (PROTONIX) 40 MG tablet Take 1 tablet (40 mg total) by mouth daily. 30 tablet 11  . Probiotic Product (PROBIOTIC DAILY PO) Take by mouth.    . simvastatin (ZOCOR) 20 MG tablet TAKE 1 TABLET (20 MG TOTAL) BY MOUTH EVERY EVENING. 90 tablet 3  . hydrOXYzine (ATARAX/VISTARIL) 25 MG tablet TAKE 1 TABLET (25 MG TOTAL) BY MOUTH 2 (TWO) TIMES DAILY AS NEEDED FOR ANXIETY. 60 tablet 5  . tiotropium (SPIRIVA HANDIHALER) 18 MCG inhalation capsule Place 1 capsule (18  mcg total) into inhaler and inhale daily. 30 capsule 2  . predniSONE (STERAPRED UNI-PAK 21 TAB) 10 MG (21) TBPK tablet Per packaging instructions 21 tablet 0   No facility-administered medications prior to visit.      Per HPI unless specifically indicated in ROS section below Review of Systems Objective:    BP 124/70 (BP Location: Left Arm, Patient Position: Sitting, Cuff Size: Large)   Pulse 78   Temp 98.2 F (36.8 C) (Oral)   Ht '5\' 10"'  (1.778 m)   Wt 263 lb 7 oz (119.5 kg)   SpO2 97%   BMI 37.80 kg/m   Wt Readings from Last 3 Encounters:  06/16/18 263 lb 7 oz (119.5 kg)  06/05/18 257 lb (116.6 kg)  06/05/18 257 lb (116.6 kg)    Physical Exam Vitals  signs and nursing note reviewed.  Constitutional:      General: He is not in acute distress.    Appearance: Normal appearance. He is obese. He is not ill-appearing.  Cardiovascular:     Rate and Rhythm: Normal rate and regular rhythm.     Pulses: Normal pulses.     Heart sounds: Normal heart sounds. No murmur.  Pulmonary:     Effort: Pulmonary effort is normal. No respiratory distress.     Breath sounds: No wheezing, rhonchi or rales.     Comments: Bibasilar crackles Musculoskeletal:     Right lower leg: Edema (tr) present.     Left lower leg: Edema (tr) present.  Neurological:     Mental Status: He is alert.       Results for orders placed or performed during the hospital encounter of 06/05/18  CBC with Differential  Result Value Ref Range   WBC 10.9 (H) 4.0 - 10.5 K/uL   RBC 5.30 4.22 - 5.81 MIL/uL   Hemoglobin 13.3 13.0 - 17.0 g/dL   HCT 42.4 39.0 - 52.0 %   MCV 80.0 80.0 - 100.0 fL   MCH 25.1 (L) 26.0 - 34.0 pg   MCHC 31.4 30.0 - 36.0 g/dL   RDW 14.7 11.5 - 15.5 %   Platelets 276 150 - 400 K/uL   nRBC 0.0 0.0 - 0.2 %   Neutrophils Relative % 67 %   Neutro Abs 7.4 1.7 - 7.7 K/uL   Lymphocytes Relative 23 %   Lymphs Abs 2.4 0.7 - 4.0 K/uL   Monocytes Relative 8 %   Monocytes Absolute 0.8 0.1 - 1.0 K/uL   Eosinophils Relative 1 %   Eosinophils Absolute 0.1 0.0 - 0.5 K/uL   Basophils Relative 1 %   Basophils Absolute 0.1 0.0 - 0.1 K/uL   Immature Granulocytes 0 %   Abs Immature Granulocytes 0.04 0.00 - 0.07 K/uL  Basic metabolic panel  Result Value Ref Range   Sodium 136 135 - 145 mmol/L   Potassium 4.2 3.5 - 5.1 mmol/L   Chloride 103 98 - 111 mmol/L   CO2 27 22 - 32 mmol/L   Glucose, Bld 166 (H) 70 - 99 mg/dL   BUN 9 8 - 23 mg/dL   Creatinine, Ser 0.56 (L) 0.61 - 1.24 mg/dL   Calcium 9.1 8.9 - 10.3 mg/dL   GFR calc non Af Amer >60 >60 mL/min   GFR calc Af Amer >60 >60 mL/min   Anion gap 6 5 - 15  Troponin I - ONCE - STAT  Result Value Ref Range   Troponin  I <0.03 <0.03 ng/mL   Lab Results  Component  Value Date   HGBA1C 7.1 (H) 05/24/2018    Spirometry today: pre: FVC 54%, FEV1 51%, ratio 0.71 post: FVC 54%, FEV1 49%, ratio 0.69 Assessment & Plan:   Problem List Items Addressed This Visit    Type II diabetes mellitus ([place])    He's noted deterioration in cbg's since taking prednisone from ER, but starting to improve.       Sleep apnea, central    Now back to regularly using CPAP. Encouraged cards or pulm f/u.       Right sided facial pain (Secondary Area of Pain) (Chronic)    Ongoing facial pain - will reschedule maxillofacial CT.       Restrictive lung disease    Presumed COPD, may suggest return to pulm.       Panic attacks    Increase hydroxyzine to 17m PRN. Continue celexa.       Relevant Medications   hydrOXYzine (ATARAX/VISTARIL) 50 MG tablet   Exertional dyspnea - Primary    Chronic, may be worse since stopping carvediolol. Have advised he restart this. Check BNP to eval for CHF exacerbation as cause. Consider increased diuretic for next few days pending results.       Relevant Medications   albuterol (PROVENTIL) (2.5 MG/3ML) 0.083% nebulizer solution 2.5 mg (Completed)   Other Relevant Orders   PR EVAL OF BRONCHOSPASM (Completed)   Brain natriuretic peptide   Ex-smoker    Off and on smoker, last cigarette was 1 month ago. Encouraged full cessation.       Relevant Medications   albuterol (PROVENTIL) (2.5 MG/3ML) 0.083% nebulizer solution 2.5 mg (Completed)   Other Relevant Orders   PR EVAL OF BRONCHOSPASM (Completed)   Chronic systolic CHF (congestive heart failure) (HCC)    Check BNP.       Relevant Medications   carvedilol (COREG) 6.25 MG tablet   Other Relevant Orders   Brain natriuretic peptide       Meds ordered this encounter  Medications  . montelukast (SINGULAIR) 10 MG tablet    Sig: Take 1 tablet (10 mg total) by mouth at bedtime.    Dispense:  30 tablet    Refill:  11  . carvedilol  (COREG) 6.25 MG tablet    Sig: TAKE 1 TABLET BY MOUTH 2 TIMES DAILY WITH A MEAL.    Dispense:  180 tablet    Refill:  1  . hydrOXYzine (ATARAX/VISTARIL) 50 MG tablet    Sig: Take 1 tablet (50 mg total) by mouth 2 (two) times daily as needed for anxiety.    Dispense:  60 tablet    Refill:  1  . albuterol (PROVENTIL) (2.5 MG/3ML) 0.083% nebulizer solution 2.5 mg   Orders Placed This Encounter  Procedures  . Brain natriuretic peptide  . PR EVAL OF BRONCHOSPASM    Patient Instructions  Spirometry today. Labs today See MRosaria Ferriesto schedule CT scan.  Singulair refilled.  Restart carvedilol.  Call Dr KNehemiah Massedfor a follow up visit.    Follow up plan: Return if symptoms worsen or fail to improve.  JRia Bush MD

## 2018-06-16 NOTE — Patient Instructions (Addendum)
Spirometry today. Labs today See Rosaria Ferries to schedule CT scan.  Singulair refilled.  Restart carvedilol.  Call Dr Nehemiah Massed for a follow up visit.

## 2018-06-16 NOTE — Assessment & Plan Note (Signed)
He's noted deterioration in cbg's since taking prednisone from ER, but starting to improve.

## 2018-06-16 NOTE — Assessment & Plan Note (Addendum)
Chronic, may be worse since stopping carvediolol. Have advised he restart this. Check BNP to eval for CHF exacerbation as cause. Consider increased diuretic for next few days pending results.

## 2018-06-17 ENCOUNTER — Other Ambulatory Visit: Payer: Self-pay | Admitting: Family Medicine

## 2018-06-17 DIAGNOSIS — G4731 Primary central sleep apnea: Secondary | ICD-10-CM

## 2018-06-17 DIAGNOSIS — R0609 Other forms of dyspnea: Principal | ICD-10-CM

## 2018-06-19 ENCOUNTER — Telehealth: Payer: Self-pay | Admitting: Family Medicine

## 2018-06-19 NOTE — Telephone Encounter (Signed)
plz phone in due to E prescribing error.  

## 2018-06-20 NOTE — Telephone Encounter (Signed)
Refill left on vm at pharmacy.  

## 2018-06-23 ENCOUNTER — Telehealth: Payer: Self-pay

## 2018-06-23 NOTE — Telephone Encounter (Signed)
I spoke with [name]; [name] last seen 06/16/18 and burning in chest and ongoing SOB is the same as when seen on 06/16/18. [name] has developed a non prod cough;still no fever. [name] plans to sit and rest at home this weekend and will be OK to wait and see DrG on 06/26/18 at 8:15. If [name] condition changes or worsens prior to appt [name] will go to [facility] or ED. FYI to Dr Darnell Level.

## 2018-06-23 NOTE — Telephone Encounter (Signed)
Noted  

## 2018-06-26 ENCOUNTER — Ambulatory Visit (INDEPENDENT_AMBULATORY_CARE_PROVIDER_SITE_OTHER): Payer: Medicare Other | Admitting: Family Medicine

## 2018-06-26 ENCOUNTER — Other Ambulatory Visit: Payer: Self-pay | Admitting: Family Medicine

## 2018-06-26 ENCOUNTER — Encounter: Payer: Self-pay | Admitting: Family Medicine

## 2018-06-26 VITALS — BP 120/78 | HR 80 | Temp 97.8°F | Ht 70.0 in | Wt 268.0 lb

## 2018-06-26 DIAGNOSIS — R51 Headache: Secondary | ICD-10-CM | POA: Diagnosis not present

## 2018-06-26 DIAGNOSIS — R079 Chest pain, unspecified: Secondary | ICD-10-CM | POA: Diagnosis not present

## 2018-06-26 DIAGNOSIS — R519 Headache, unspecified: Secondary | ICD-10-CM

## 2018-06-26 NOTE — Patient Instructions (Addendum)
If interested, check with pharmacy about new 2 shot shingles series (shingrix).  I'm glad you're doing better.  Ok to take robitussin DM as needed.

## 2018-06-26 NOTE — Assessment & Plan Note (Signed)
Will await maxillofacial CT scheduled for tomorrow. No fever or sinus congestion at this time, just residual episodes of pain.

## 2018-06-26 NOTE — Progress Notes (Signed)
BP 120/78 (BP Location: Left Arm, Patient Position: Sitting, Cuff Size: Normal)   Pulse 80   Temp 97.8 F (36.6 C) (Oral)   Ht _0  (1.778 m)   Wt 268 lb (121.6 kg)   SpO2 94%   BMI 38.45 kg/m    CC: ongoing dyspnea Subjective:    Patient ID: [name], male    DOB: 1955-10-02, 63 y.o.   MRN: [id number]  HPI: [name] is a 63 y.o. male presenting on 06/26/2018 for Chest Pain (C/o burning sensation in chest and continued SOB. States burning in chest has improved and pt feels better.  Pt provided form from [facility] nurse to keep.)   See prior note for details. Seen at ER earlier this month for COPD exacerbation treated with prednisone course. Ongoing dyspnea and chest tightness with minimal exertion, not at rest. Thursday night started feeling bad burning in chest - thought indigestion. Friday morning he developed dry cough - treated at home with Robitussin DM OTC gel capsules. He feels this has significantly helped and has felt better each day.   Known CHF regularly on lasix 9m bid.   Last saw pulm (Ashby Dawes 06/2017 - rescheduled for next week. Never completed PFTs. Last saw [facility] 07/2017. OSA on CPAP - overdue for f/u. Unclear if followed by pulm or cards. New mask since last month (dog ate old mask).   Maxillofacial CT pending for tomorrow. Feels this is progressing and has localized to R maxillary sinus. Describes sharp pain that travels into R eye, lasts about 30 seconds, can happen 5 times in a row, happens 6-7 times a day. No congestion or productive mucous, no fevers.   Hydroxyzine recently increased to 558mPRN panic attacks.   Spirometry 06/16/2018: pre: FVC 54%, FEV1 51%, ratio 0.71 post: FVC 54%, FEV1 49%, ratio 0.69     Relevant past medical, surgical, family and social history reviewed and updated as indicated. Interim medical history since our last visit reviewed. Allergies and medications reviewed and updated. Outpatient Medications Prior to  Visit  Medication Sig Dispense Refill  . ACCU-CHEK FASTCLIX LANCETS MISC Check blood sugar once daily and as instructed. Dx 250.00 100 each 3  . aspirin EC 81 MG tablet Take 81 mg by mouth daily.    . Blood Glucose Monitoring Suppl (BLOOD GLUCOSE MONITOR SYSTEM) W/DEVICE KIT by Does not apply route. 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 carvedilol (COREG) 6.25 MG tablet TAKE 1 TABLET BY MOUTH 2 TIMES DAILY WITH A MEAL. 180 tablet 1  . citalopram (CELEXA) 10 MG tablet Take 1 tablet (10 mg total) by mouth daily. 30 tablet 11  . fluticasone (FLONASE) 50 MCG/ACT nasal spray SPRAY 2 SPRAYS INTO EACH NOSTRIL EVERY DAY 16 g 3  . furosemide (LASIX) 20 MG tablet TAKE 1 TABLET (20 MG TOTAL) BY MOUTH 2 (TWO) TIMES DAILY AS NEEDED. 90 tablet 3  . glimepiride (AMARYL) 2 MG tablet Take 1 tablet (2 mg total) by mouth daily with breakfast. 90 tablet 1  . hydrOXYzine (ATARAX/VISTARIL) 50 MG tablet TAKE 1 TABLET (50 MG TOTAL) BY MOUTH 2 (TWO) TIMES DAILY AS NEEDED FOR ANXIETY. 180 tablet 1  . metFORMIN (GLUCOPHAGE) 1000 MG tablet Take 1 tablet (1,000 mg total) by mouth 2 (two) times daily with a meal. 180 tablet 1  . montelukast (SINGULAIR) 10 MG tablet Take 1 tablet (10 mg total) by mouth at bedtime. 30 tablet  11  . ONETOUCH VERIO test strip USE AS INSTRUCTED TO CHECK THREE TIMES DAILY AND AS NEEDED. E11.8 300 each 3  . pantoprazole (PROTONIX) 40 MG tablet Take 1 tablet (40 mg total) by mouth daily. 30 tablet 11  . Probiotic Product (PROBIOTIC DAILY PO) Take by mouth.    . simvastatin (ZOCOR) 20 MG tablet TAKE 1 TABLET (20 MG TOTAL) BY MOUTH EVERY EVENING. 90 tablet 3   No facility-administered medications prior to visit.      Per HPI unless specifically indicated in ROS section below Review of Systems Objective:    BP 120/78 (BP Location: Left Arm, Patient Position: Sitting, Cuff Size: Normal)   Pulse 80   Temp 97.8 F (36.6 C) (Oral)   Ht _0  (1.778 m)   Wt  268 lb (121.6 kg)   SpO2 94%   BMI 38.45 kg/m   Wt Readings from Last 3 Encounters:  06/26/18 268 lb (121.6 kg)  06/16/18 263 lb 7 oz (119.5 kg)  06/05/18 257 lb (116.6 kg)    Physical Exam Vitals signs and nursing note reviewed.  Constitutional:      General: He is not in acute distress.    Appearance: Normal appearance. He is well-developed.  HENT:     Head: Normocephalic and atraumatic.     Right Ear: Hearing normal.     Left Ear: Hearing normal.     Nose: Congestion present. No mucosal edema or rhinorrhea.     Right Sinus: Maxillary sinus tenderness (mild) present. No frontal sinus tenderness.     Left Sinus: No maxillary sinus tenderness or frontal sinus tenderness.     Mouth/Throat:     Pharynx: Uvula midline. No oropharyngeal exudate or posterior oropharyngeal erythema.     Tonsils: No tonsillar abscesses.  Eyes:     General: No scleral icterus.    Conjunctiva/sclera: Conjunctivae normal.     Pupils: Pupils are equal, round, and reactive to light.  Neck:     Musculoskeletal: Normal range of motion and neck supple.  Cardiovascular:     Rate and Rhythm: Normal rate and regular rhythm.     Pulses: Normal pulses.     Heart sounds: Normal heart sounds. No murmur.  Pulmonary:     Effort: Pulmonary effort is normal. No respiratory distress.     Breath sounds: Examination of the right-lower field reveals decreased breath sounds. Decreased breath sounds present. No wheezing, rhonchi or rales.  Lymphadenopathy:     Cervical: No cervical adenopathy.  Skin:    General: Skin is warm and dry.     Findings: No rash.  Neurological:     Mental Status: He is alert.       Results for orders placed or performed in visit on 06/16/18  Brain natriuretic peptide  Result Value Ref Range   Pro B Natriuretic peptide (BNP) 11.0 0.0 - 100.0 pg/mL   Assessment & Plan:   Problem List Items Addressed This Visit    Right sided facial pain (Secondary Area of Pain) (Chronic)    Will await  maxillofacial CT scheduled for tomorrow. No fever or sinus congestion at this time, just residual episodes of pain.       Chest pain - Primary    Sounds GERD related however improved with robitussin DM. Now resolved - will continue to monitor.           No orders of the defined types were placed in this encounter.  No orders of the defined types  were placed in this encounter.   Follow up plan: Return if symptoms worsen or fail to improve.  [name], MD

## 2018-06-26 NOTE — Assessment & Plan Note (Signed)
Sounds GERD related however improved with robitussin DM. Now resolved - will continue to monitor.

## 2018-06-27 ENCOUNTER — Other Ambulatory Visit: Payer: Self-pay | Admitting: Family Medicine

## 2018-06-27 ENCOUNTER — Ambulatory Visit
Admission: RE | Admit: 2018-06-27 | Discharge: 2018-06-27 | Disposition: A | Discharge status: Home / Self Care | Payer: Medicare Other | Source: Ambulatory Visit | Attending: Family Medicine | Admitting: Family Medicine

## 2018-06-27 DIAGNOSIS — G4489 Other headache syndrome: Secondary | ICD-10-CM | POA: Diagnosis not present

## 2018-06-27 DIAGNOSIS — H5710 Ocular pain, unspecified eye: Secondary | ICD-10-CM | POA: Diagnosis not present

## 2018-06-27 MED ORDER — AMOXICILLIN-POT CLAVULANATE 875-125 MG PO TABS: 1.0000 | ORAL_TABLET | Freq: Two times a day (BID) | ORAL | 0 refills | Status: AC

## 2018-06-30 DIAGNOSIS — I25118 Atherosclerotic heart disease of native coronary artery with other forms of angina pectoris: Secondary | ICD-10-CM | POA: Diagnosis not present

## 2018-06-30 DIAGNOSIS — I4891 Unspecified atrial fibrillation: Secondary | ICD-10-CM | POA: Diagnosis not present

## 2018-06-30 DIAGNOSIS — I4892 Unspecified atrial flutter: Secondary | ICD-10-CM | POA: Diagnosis not present

## 2018-06-30 DIAGNOSIS — E782 Mixed hyperlipidemia: Secondary | ICD-10-CM | POA: Diagnosis not present

## 2018-06-30 DIAGNOSIS — I420 Dilated cardiomyopathy: Secondary | ICD-10-CM | POA: Diagnosis not present

## 2018-07-03 NOTE — Progress Notes (Signed)
[name]     Assessment and Plan:  OSA.  - Severe OSA with AHI of 77. - Patient encouraged to continue use of CPAP, and use whenever he is sleeping.  Insomnia with circadian rhythm disorder.  -Patient typically goes to bed in the early morning hours and sleeps till around noon.  He has done this for several years. -Recommended that he reduce his daytime naps, and increase his overnight sleep times.  Chronic bronchitis with dyspnea on exertion with history of smoking.   -Discussed with patient that he likely has some degree of chronic bronchitis versus emphysema/COPD from his history of smoking. - He also has likely some degree of deconditioning from his years of sedentary lifestyle and excess weight. -We will start re-Spiriva once daily.  --Discussed lung cancer screening, will refer.   Obesity.  --BMI=42.  - Discussed that weight loss will be beneficial for his health and his breathing.   Meds ordered this encounter  Medications  . tiotropium (SPIRIVA HANDIHALER) 18 MCG inhalation capsule    Sig: Place 1 capsule (18 mcg total) into inhaler and inhale daily.    Dispense:  30 capsule    Refill:  2   Return in about 6 months (around 01/04/2019).    Date: 07/03/2018  MRN# [id number] [name] Feb 12, 1956    [name] is a 63 y.o. old male seen in consultation for chief complaint of:    Chief Complaint  Patient presents with  . Acute Visit    States he is having increased SOB. He states he has a sinus infection. He is currently on Amoxicillin. HE states he was having pain in his face, ear, and neck that has since improved since starting the antibiotic.     HPI:   The patient is a 63 year old male with OSA, insomnia, chronic bronchitis.Marland Kitchen  He was asked to continue CPAP, reduce daytime naps, weight loss, and you Spiriva daily. 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  He has been told that he  might has COPD. He borrowed his sisters inhaler of albuterol, he does not think that it helps.  He last smoked in Jan of this year, he was smoking about a ppd. He continues to have dyspnea. He has a proair but does not really use it. He does not think that he has spiriva.  He is using CPAP every night for about 4 hours per night. He is less sleepy during the day, but he continues to have very poor sleep hygiene, going to bed at 6 am, and wakes at noon. He used to work nights, he has been in this sleep pattern for several years. He continues to nap during the day.    **Split night Sleep study 06/30/17>> AHI of 77.5, Recommend CPAP @ 13 **CXR 06/22/2016>> mild hyperinflation, otherwise normal.  **Stress Echo 06/13/17>> normal stress., EF=50%.  Medication:    Current Outpatient Medications:  .  ACCU-CHEK FASTCLIX LANCETS MISC, Check blood sugar once daily and as instructed. Dx 250.00, Disp: 100 each, Rfl: 3 .  amoxicillin-clavulanate (AUGMENTIN) 875-125 MG tablet, Take 1 tablet by mouth 2 (two) times daily for 10 days., Disp: 20 tablet, Rfl: 0 .  aspirin EC 81 MG tablet, Take 81 mg by mouth daily., Disp: , Rfl:  .  Blood Glucose Monitoring Suppl (BLOOD GLUCOSE MONITOR SYSTEM) W/DEVICE KIT, by Does not apply route. Use to check sugar once daily and as needed Dx:  E11.9 **ONE TOUCH VERIO**, Disp: , Rfl:  .  carvedilol (COREG) 6.25 MG tablet, TAKE 1 TABLET BY MOUTH 2 TIMES DAILY WITH A MEAL., Disp: 180 tablet, Rfl: 1 .  citalopram (CELEXA) 10 MG tablet, Take 1 tablet (10 mg total) by mouth daily., Disp: 30 tablet, Rfl: 11 .  fluticasone (FLONASE) 50 MCG/ACT nasal spray, SPRAY 2 SPRAYS INTO EACH NOSTRIL EVERY DAY, Disp: 16 g, Rfl: 3 .  furosemide (LASIX) 20 MG tablet, TAKE 1 TABLET (20 MG TOTAL) BY MOUTH 2 (TWO) TIMES DAILY AS NEEDED., Disp: 90 tablet, Rfl: 1 .  glimepiride (AMARYL) 2 MG tablet, Take 1 tablet (2 mg total) by mouth daily with breakfast., Disp: 90 tablet, Rfl: 1 .  hydrOXYzine  (ATARAX/VISTARIL) 50 MG tablet, TAKE 1 TABLET (50 MG TOTAL) BY MOUTH 2 (TWO) TIMES DAILY AS NEEDED FOR ANXIETY., Disp: 180 tablet, Rfl: 1 .  metFORMIN (GLUCOPHAGE) 1000 MG tablet, Take 1 tablet (1,000 mg total) by mouth 2 (two) times daily with a meal., Disp: 180 tablet, Rfl: 1 .  montelukast (SINGULAIR) 10 MG tablet, Take 1 tablet (10 mg total) by mouth at bedtime., Disp: 30 tablet, Rfl: 11 .  ONETOUCH VERIO test strip, USE AS INSTRUCTED TO CHECK THREE TIMES DAILY AND AS NEEDED. E11.8, Disp: 300 each, Rfl: 3 .  pantoprazole (PROTONIX) 40 MG tablet, Take 1 tablet (40 mg total) by mouth daily., Disp: 30 tablet, Rfl: 11 .  Probiotic Product (PROBIOTIC DAILY PO), Take by mouth., Disp: , Rfl:  .  simvastatin (ZOCOR) 20 MG tablet, TAKE 1 TABLET (20 MG TOTAL) BY MOUTH EVERY EVENING., Disp: 90 tablet, Rfl: 3   Allergies:  Atorvastatin and Lisinopril  Review of Systems:  Constitutional: Feels well. Cardiovascular: Denies chest pain, exertional chest pain.  Pulmonary: Denies hemoptysis, pleuritic chest pain.   The remainder of systems were reviewed and were found to be negative other than what is documented in the HPI.    Physical Examination:   VS: BP (!) 144/92   Pulse 79   Ht _0  (1.803 m)   Wt 265 lb 6.4 oz (120.4 kg)   SpO2 94%   BMI 37.02 kg/m   General Appearance: No distress  Neuro:without focal findings, mental status, speech normal, alert and oriented HEENT: PERRLA, EOM intact Pulmonary: No wheezing, No rales  CardiovascularNormal S1,S2.  No m/r/g.  Abdomen: Benign, Soft, non-tender, No masses Renal:  No costovertebral tenderness  GU:  No performed at this time. Endoc: No evident thyromegaly, no signs of acromegaly or Cushing features Skin:   warm, no rashes, no ecchymosis  Extremities: normal, no cyanosis, clubbing.      LABORATORY PANEL:   CBC No results for input(s): WBC, HGB, HCT, PLT in the last 168  hours. ------------------------------------------------------------------------------------------------------------------  Chemistries  No results for input(s): NA, K, CL, CO2, GLUCOSE, BUN, CREATININE, CALCIUM, MG, AST, ALT, ALKPHOS, BILITOT in the last 168 hours.  Invalid input(s): GFRCGP ------------------------------------------------------------------------------------------------------------------  Cardiac Enzymes No results for input(s): TROPONINI in the last 168 hours. ------------------------------------------------------------  RADIOLOGY:  No results found.     Thank  you for the consultation and for allowing [facility] to assist in the care of your patient. Our recommendations are noted above.  Please contact us if we can be of further service.  Marda Stalker, M.D., F.C.C.P.  Board Certified in Internal Medicine, Pulmonary Medicine, [place], and Sleep Medicine.  [facility] Office Number: [phone]  07/03/2018

## 2018-07-04 ENCOUNTER — Encounter: Payer: Self-pay | Admitting: Internal Medicine

## 2018-07-04 ENCOUNTER — Ambulatory Visit: Payer: Medicare Other | Admitting: Internal Medicine

## 2018-07-04 VITALS — BP 144/92 | HR 79 | Ht 71.0 in | Wt 265.4 lb

## 2018-07-04 DIAGNOSIS — J42 Unspecified chronic bronchitis: Secondary | ICD-10-CM | POA: Diagnosis not present

## 2018-07-04 DIAGNOSIS — G4733 Obstructive sleep apnea (adult) (pediatric): Secondary | ICD-10-CM | POA: Diagnosis not present

## 2018-07-04 MED ORDER — TIOTROPIUM BROMIDE MONOHYDRATE 18 MCG IN CAPS: 18.0000 ug | ORAL_CAPSULE | Freq: Every day | RESPIRATORY_TRACT | 2 refills | Status: DC

## 2018-07-04 NOTE — Patient Instructions (Signed)
Will refer to lung cancer screening.  Will start spiriva once daily, call us if it is too expensive.  Continue cpap every night, try to use it whenever you sleep.

## 2018-07-05 ENCOUNTER — Telehealth: Payer: Self-pay | Admitting: *Deleted

## 2018-07-05 DIAGNOSIS — Z122 Encounter for screening for malignant neoplasm of respiratory organs: Secondary | ICD-10-CM

## 2018-07-05 DIAGNOSIS — Z87891 Personal history of nicotine dependence: Secondary | ICD-10-CM

## 2018-07-05 NOTE — Telephone Encounter (Signed)
Received referral for initial lung cancer screening scan. Contacted patient and obtained smoking history,(former, quit 04/26/18, 31 pack year) as well as answering questions related to screening process. Patient denies signs of lung cancer such as weight loss or hemoptysis. Patient denies comorbidity that would prevent curative treatment if lung cancer were found. Patient is scheduled for shared decision making visit and CT scan on 07/27/18 at 145pm.

## 2018-07-17 ENCOUNTER — Telehealth: Payer: Self-pay | Admitting: *Deleted

## 2018-07-17 NOTE — Telephone Encounter (Signed)
Patient notified/message left to notify patient that due to current restrictions lung screening appointments are cancelled and patient will be contacted regarding rescheduling.  

## 2018-07-18 ENCOUNTER — Other Ambulatory Visit: Payer: Self-pay | Admitting: Family Medicine

## 2018-07-25 ENCOUNTER — Ambulatory Visit (INDEPENDENT_AMBULATORY_CARE_PROVIDER_SITE_OTHER): Payer: Medicare Other | Admitting: Family Medicine

## 2018-07-25 ENCOUNTER — Other Ambulatory Visit: Payer: Self-pay

## 2018-07-25 ENCOUNTER — Encounter: Payer: Self-pay | Admitting: Family Medicine

## 2018-07-25 VITALS — Ht 70.0 in | Wt 268.0 lb

## 2018-07-25 DIAGNOSIS — J0111 Acute recurrent frontal sinusitis: Secondary | ICD-10-CM

## 2018-07-25 DIAGNOSIS — J449 Chronic obstructive pulmonary disease, unspecified: Secondary | ICD-10-CM | POA: Insufficient documentation

## 2018-07-25 DIAGNOSIS — J42 Unspecified chronic bronchitis: Secondary | ICD-10-CM

## 2018-07-25 MED ORDER — AMOXICILLIN-POT CLAVULANATE 875-125 MG PO TABS: 1.0000 | ORAL_TABLET | Freq: Two times a day (BID) | ORAL | 0 refills | Status: AC

## 2018-07-25 NOTE — Assessment & Plan Note (Signed)
Recurrent sinusitis previously confirmed by maxillofacial CT, with allergic exacerbation component. Initial improvement on augmentin early this month, now acute exacerbation after mowing the lawn yesterday. Symptoms feel similar to prior sinusitis symptoms. Will treat aggressively with augmentin course. Reviewed allergen avoidance measures ie wearing mask for mowing, taking shower after mowing, using nasal saline irrigation throughout the day especially when exposed to allergens. Continue flonase nasal steroid. Pt agrees with plan.

## 2018-07-25 NOTE — Progress Notes (Signed)
Virtual visit attempted through WebEx. Pt unable to use this - converted to Doxy[employer]Me visit. Video connected and I was able to see/hear/evaluate patient however he was unable to hear me - so we completed visit on the phone.  Patient location: home Provider location: [place] at [facility], office If any vitals were documented below, they were collected by patient at home.    Interactive audio and video telecommunications were attempted between myself and [name], however failed due to patient having technical difficulties. We continued and completed visit with audio only.   Ht '5\' 10"'  (1.778 m)   Wt 268 lb (121.6 kg)   BMI 38.45 kg/m    CC: sinus problems Subjective:    Patient ID: [name], male    DOB: February 19, 1956, 63 y.o.   MRN: [id number]  HPI: [name] is a 63 y.o. male presenting on 07/25/2018 for Sinus Problem (C/o sinus pain on right side and right eye pain. Sxs started about 2 days ago. Same sxs as about 3 wks ago. States the abx he was treated with cleared it up. )    See prior notes for details.  1d h/o R maxillary facial pain recurrence - started using flonase with benefit yesterday. Today notes worsening facial pain especially with chewing.   Denies cough, dyspnea, fevers or chills.   He did Therapist, occupational over weekend.   H/o recurrent facial pain, maxillofacial CT 06/2018 with scattered ethmoid and R frontal sinus disease - treated with antibiotic course (augmentin) 06/2018 with resolution of symptoms at that time.       Relevant past medical, surgical, family and social history reviewed and updated as indicated. Interim medical history since our last visit reviewed. Allergies and medications reviewed and updated. Outpatient Medications Prior to Visit  Medication Sig Dispense Refill  . ACCU-CHEK FASTCLIX LANCETS MISC Check blood sugar once daily and as instructed. Dx 250.00 100 each 3  . aspirin EC 81 MG tablet Take 81 mg by mouth daily.    .  Blood Glucose Monitoring Suppl (BLOOD GLUCOSE MONITOR SYSTEM) W/DEVICE KIT by Does not apply route. 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 carvedilol (COREG) 6.25 MG tablet TAKE 1 TABLET BY MOUTH 2 TIMES DAILY WITH A MEAL. 180 tablet 1  . citalopram (CELEXA) 10 MG tablet Take 1 tablet (10 mg total) by mouth daily. 30 tablet 11  . fluticasone (FLONASE) 50 MCG/ACT nasal spray SPRAY 2 SPRAYS INTO EACH NOSTRIL EVERY DAY 16 g 3  . furosemide (LASIX) 20 MG tablet TAKE 1 TABLET (20 MG TOTAL) BY MOUTH 2 (TWO) TIMES DAILY AS NEEDED. 90 tablet 1  . glimepiride (AMARYL) 2 MG tablet Take 1 tablet (2 mg total) by mouth daily with breakfast. 90 tablet 1  . hydrOXYzine (ATARAX/VISTARIL) 50 MG tablet TAKE 1 TABLET (50 MG TOTAL) BY MOUTH 2 (TWO) TIMES DAILY AS NEEDED FOR ANXIETY. 180 tablet 1  . metFORMIN (GLUCOPHAGE) 1000 MG tablet TAKE 1 TABLET (1,000 MG TOTAL) BY MOUTH 2 (TWO) TIMES DAILY WITH A MEAL. 180 tablet 1  . montelukast (SINGULAIR) 10 MG tablet Take 1 tablet (10 mg total) by mouth at bedtime. 30 tablet 11  . ONETOUCH VERIO test strip USE AS INSTRUCTED TO CHECK THREE TIMES DAILY AND AS NEEDED. E11.8 300 each 3  . pantoprazole (PROTONIX) 40 MG tablet Take 1 tablet (40 mg total) by mouth daily. 30 tablet 11  . Probiotic Product (  PROBIOTIC DAILY PO) Take by mouth.    . simvastatin (ZOCOR) 20 MG tablet TAKE 1 TABLET (20 MG TOTAL) BY MOUTH EVERY EVENING. 90 tablet 3  . tiotropium (SPIRIVA HANDIHALER) 18 MCG inhalation capsule Place 1 capsule (18 mcg total) into inhaler and inhale daily. 30 capsule 2   No facility-administered medications prior to visit.      Per HPI unless specifically indicated in ROS section below Review of Systems Objective:    Ht '5\' 10"'  (1.778 m)   Wt 268 lb (121.6 kg)   BMI 38.45 kg/m   Wt Readings from Last 3 Encounters:  07/25/18 268 lb (121.6 kg)  07/04/18 265 lb 6.4 oz (120.4 kg)  06/26/18 268 lb (121.6 kg)    Physical Exam Vitals  signs and nursing note reviewed.  Constitutional:      General: He is not in acute distress.    Appearance: Normal appearance. He is not ill-appearing.  Pulmonary:     Comments: Speaks in complete sentences without increased work of breathing Neurological:     Mental Status: He is alert.       Lab Results  Component Value Date   HGBA1C 7.1 (H) 05/24/2018   Assessment & Plan:   Problem List Items Addressed This Visit    Chronic bronchitis ([place])    Saw pulm, started on spiriva but unable to afford. Advised contact pulm to f/u about possible Rx card - he mentions he's still waiting to hear back from them about this.       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 Initial improvement on augmentin early this month, now acute exacerbation after mowing the lawn yesterday. Symptoms feel similar to prior sinusitis symptoms. Will treat aggressively with augmentin course. Reviewed allergen avoidance measures ie wearing mask for mowing, taking shower after mowing, using nasal saline irrigation throughout the day especially when exposed to allergens. Continue flonase nasal steroid. Pt agrees with plan.       Relevant Medications   amoxicillin-clavulanate (AUGMENTIN) 875-125 MG tablet       Meds ordered this encounter  Medications  . amoxicillin-clavulanate (AUGMENTIN) 875-125 MG tablet    Sig: Take 1 tablet by mouth 2 (two) times daily for 10 days.    Dispense:  20 tablet    Refill:  0   No orders of the defined types were placed in this encounter.   Follow up plan: No follow-ups on file.  [name] Bush, MD

## 2018-07-25 NOTE — Assessment & Plan Note (Signed)
Saw pulm, started on spiriva but unable to afford. Advised contact pulm to f/u about possible Rx card - he mentions he's still waiting to hear back from them about this.

## 2018-07-27 ENCOUNTER — Ambulatory Visit: Payer: Medicare Other

## 2018-07-27 ENCOUNTER — Ambulatory Visit: Payer: Medicare Other | Admitting: Nurse Practitioner

## 2018-08-18 ENCOUNTER — Other Ambulatory Visit: Payer: Self-pay | Admitting: Family Medicine

## 2018-09-12 ENCOUNTER — Other Ambulatory Visit: Payer: Self-pay | Admitting: Family Medicine

## 2018-09-14 ENCOUNTER — Telehealth: Payer: Self-pay | Admitting: *Deleted

## 2018-09-14 NOTE — Telephone Encounter (Signed)
Attempted to reschedule lung screening scan, patient reports he is not feeling well and would like to consider in 1 month.

## 2018-09-15 ENCOUNTER — Ambulatory Visit: Payer: Self-pay

## 2018-09-15 NOTE — Telephone Encounter (Signed)
-  Incoming call from  Patient,  With a complaint of severe stomach Pain which  stared on Tuesday.  States it started on  Tuesday.  States having a BM and antacids does no help relieve.  Attempted to make an  Virtual appointment.  Patient not agreeable.  Abruptly hung up the phone.    Answer Assessment - Initial Assessment Questions 1. LOCATION: "Where does it hurt?"      middle 2. RADIATION: "Does the pain shoot anywhere else?" (e.g., chest, back)     Chest   Hard to breath 3. ONSET: "When did the pain begin?" (Minutes, hours or days ago)     Tuesday 4. SUDDEN: "Gradual or sudden onset?"     Gradual 5. PATTERN "Does the pain come and go, or is it constant?"    - If constant: "Is it getting better, staying the same, or worsening?"      (Note: Constant means the pain never goes away completely; most serious pain is constant and it progresses)     - If intermittent: "How long does it last?" "Do you have pain now?"     (Note: Intermittent means the pain goes away completely between bouts)     constant6. SEVERITY: "How bad is the pain?"  (e.g., Scale 1-10; mild, moderate, or severe)    - MILD (1-3): doesn't interfere with normal activities, abdomen soft and not tender to touch     - MODERATE (4-7): interferes with normal activities or awakens from sleep, tender to touch     - SEVERE (8-10): excruciating pain, doubled over, unable to do any normal activities       Severe 7. RECURRENT SYMPTOM: "Have you ever had this type of abdominal pain before?" If so, ask: "When was the last time?" and "What happened that time?"      yes 8. CAUSE: "What do you think is causing the abdominal pain?"     *No Answer* 9. RELIEVING/AGGRAVATING FACTORS: "What makes it better or worse?" (e.g., movement, antacids, bowel movement)    dosent help 10. OTHER SYMPTOMS: "Has there been any vomiting, diarrhea, constipation, or urine problems?"       denies  Protocols used: ABDOMINAL PAIN - MALE-A-AH

## 2018-09-19 ENCOUNTER — Encounter: Payer: Self-pay | Admitting: Family Medicine

## 2018-09-19 ENCOUNTER — Ambulatory Visit (INDEPENDENT_AMBULATORY_CARE_PROVIDER_SITE_OTHER): Payer: Medicare Other | Admitting: Family Medicine

## 2018-09-19 ENCOUNTER — Other Ambulatory Visit: Payer: Self-pay | Admitting: Family Medicine

## 2018-09-19 ENCOUNTER — Other Ambulatory Visit: Payer: Self-pay

## 2018-09-19 ENCOUNTER — Ambulatory Visit: Payer: Self-pay | Admitting: *Deleted

## 2018-09-19 VITALS — BP 126/72 | HR 77 | Temp 98.2°F | Ht 70.0 in | Wt 274.0 lb

## 2018-09-19 DIAGNOSIS — I2510 Atherosclerotic heart disease of native coronary artery without angina pectoris: Secondary | ICD-10-CM

## 2018-09-19 DIAGNOSIS — R0789 Other chest pain: Secondary | ICD-10-CM | POA: Diagnosis not present

## 2018-09-19 DIAGNOSIS — K429 Umbilical hernia without obstruction or gangrene: Secondary | ICD-10-CM

## 2018-09-19 DIAGNOSIS — R1013 Epigastric pain: Secondary | ICD-10-CM

## 2018-09-19 DIAGNOSIS — R140 Abdominal distension (gaseous): Secondary | ICD-10-CM

## 2018-09-19 DIAGNOSIS — E1169 Type 2 diabetes mellitus with other specified complication: Secondary | ICD-10-CM

## 2018-09-19 LAB — CBC WITH DIFFERENTIAL/PLATELET
Basophils Absolute: 0.1 10*3/uL (ref 0.0–0.1)
Basophils Relative: 0.6 % (ref 0.0–3.0)
Eosinophils Absolute: 0.2 10*3/uL (ref 0.0–0.7)
Eosinophils Relative: 2.4 % (ref 0.0–5.0)
HCT: 35 % — ABNORMAL LOW (ref 39.0–52.0)
Hemoglobin: 11.4 g/dL — ABNORMAL LOW (ref 13.0–17.0)
Lymphocytes Relative: 25.5 % (ref 12.0–46.0)
Lymphs Abs: 2.6 10*3/uL (ref 0.7–4.0)
MCHC: 32.7 g/dL (ref 30.0–36.0)
MCV: 74.7 fl — ABNORMAL LOW (ref 78.0–100.0)
Monocytes Absolute: 1 10*3/uL (ref 0.1–1.0)
Monocytes Relative: 9.5 % (ref 3.0–12.0)
Neutro Abs: 6.3 10*3/uL (ref 1.4–7.7)
Neutrophils Relative %: 62 % (ref 43.0–77.0)
Platelets: 244 10*3/uL (ref 150.0–400.0)
RBC: 4.69 Mil/uL (ref 4.22–5.81)
RDW: 16.3 % — ABNORMAL HIGH (ref 11.5–15.5)
WBC: 10.1 10*3/uL (ref 4.0–10.5)

## 2018-09-19 LAB — COMPREHENSIVE METABOLIC PANEL
ALT: 14 U/L (ref 0–53)
AST: 14 U/L (ref 0–37)
Albumin: 4.2 g/dL (ref 3.5–5.2)
Alkaline Phosphatase: 63 U/L (ref 39–117)
BUN: 8 mg/dL (ref 6–23)
CO2: 32 mEq/L (ref 19–32)
Calcium: 9.3 mg/dL (ref 8.4–10.5)
Chloride: 101 mEq/L (ref 96–112)
Creatinine, Ser: 0.63 mg/dL (ref 0.40–1.50)
GFR: 128.62 mL/min (ref >60.00)
Glucose, Bld: 139 mg/dL — ABNORMAL HIGH (ref 70–99)
Potassium: 4.2 mEq/L (ref 3.5–5.1)
Sodium: 140 mEq/L (ref 135–145)
Total Bilirubin: 0.4 mg/dL (ref 0.2–1.2)
Total Protein: 6.6 g/dL (ref 6.0–8.3)

## 2018-09-19 LAB — LIPASE: Lipase: 14 U/L (ref 11.0–59.0)

## 2018-09-19 LAB — HEMOGLOBIN A1C: Hgb A1c MFr Bld: 7.5 % — ABNORMAL HIGH (ref 4.6–6.5)

## 2018-09-19 MED ORDER — PANTOPRAZOLE SODIUM 40 MG PO TBEC: 40.0000 mg | DELAYED_RELEASE_TABLET | Freq: Every day | ORAL | 11 refills | Status: DC

## 2018-09-19 MED ORDER — PANTOPRAZOLE SODIUM 40 MG PO TBEC: 40.0000 mg | DELAYED_RELEASE_TABLET | Freq: Two times a day (BID) | ORAL | 0 refills | Status: DC

## 2018-09-19 NOTE — Telephone Encounter (Signed)
Pt already has in office appt for now 09/19/18 at 10 AM.FYI to Dr Danise Mina.

## 2018-09-19 NOTE — Patient Instructions (Addendum)
EKG today. Labs today. Treat possible gastritis/GERD contribution by Increasing protonix to 40mg  twice daily for 10 days, add pepcid at night time for 10 days. Let us know if not improving with this treatment.

## 2018-09-19 NOTE — Assessment & Plan Note (Addendum)
Anticipate gastritis /GERD related despite protonix. Will double up on protonix, add pepcid to regimen for next 10 days. He has felt better since having bowel movement yesterday. Reviewed importance of regular BM daily - he has started stool softener as well.  Check EKG today with associated chest discomfort in setting of known DM, CAD and CHF - no acute findings other than rate variability.

## 2018-09-19 NOTE — Progress Notes (Signed)
This visit was conducted in person.  BP 126/72 (BP Location: Left Arm, Patient Position: Sitting, Cuff Size: Normal)   Pulse 77   Temp 98.2 F (36.8 C) (Oral)   Ht '5\' 10"'  (1.778 m)   Wt 274 lb (124.3 kg)   SpO2 94%   BMI 39.31 kg/m    CC: abd pain Subjective:    Patient ID: [name], male    DOB: 1955/07/15, 63 y.o.   MRN: [id number]  HPI: [name] is a 63 y.o. male presenting on 09/19/2018 for Abdominal Pain (C/o severe abd pain and nausea. Located in mid abd. Describes pain as bad gas pain causing chest pain. Started 09/12/18. Finally had BM this morning since 09/17/18.  )   Severe epigastric abd pain that travels up into L chest, associated with dyspnea and nausea that started 09/12/2018. Describes "hunger" pain. Increased bloating and constipation - did have good BM yesterday morning however with benefit. Regularly takes pantoprazole 19m. He has started taking metformin. Increased belching as well.   No boring pain to back. No fevers/chills, vomiting, diarrhea. No early satiety or dysphagia. No urinary symptoms. No RUQ pain.   He has been eating more "junk food" recently.  Sugars very elevated recently - fasting today 185. Compliant with metformin and glimepiride.  Some sinus congestion he's been treating with flonase and migraine medicine.  Never started spiriva - too expensive. Saw pulm (Ashby Dawes.   No known Covid exposure or fevers, cough.      Relevant past medical, surgical, family and social history reviewed and updated as indicated. Interim medical history since our last visit reviewed. Allergies and medications reviewed and updated. Outpatient Medications Prior to Visit  Medication Sig Dispense Refill  . ACCU-CHEK FASTCLIX LANCETS MISC Check blood sugar once daily and as instructed. Dx 250.00 100 each 3  . aspirin EC 81 MG tablet Take 81 mg by mouth daily.    . Blood Glucose Monitoring Suppl (BLOOD GLUCOSE MONITOR SYSTEM) W/DEVICE KIT by Does  not apply route. 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 carvedilol (COREG) 6.25 MG tablet TAKE 1 TABLET BY MOUTH 2 TIMES DAILY WITH A MEAL. 180 tablet 1  . citalopram (CELEXA) 10 MG tablet TAKE 1 TABLET BY MOUTH EVERY DAY 90 tablet 1  . fluticasone (FLONASE) 50 MCG/ACT nasal spray SPRAY 2 SPRAYS INTO EACH NOSTRIL EVERY DAY 16 g 3  . furosemide (LASIX) 20 MG tablet TAKE 1 TABLET (20 MG TOTAL) BY MOUTH 2 (TWO) TIMES DAILY AS NEEDED. 90 tablet 1  . glimepiride (AMARYL) 2 MG tablet TAKE 1 TABLET (2 MG TOTAL) BY MOUTH DAILY WITH BREAKFAST. 90 tablet 1  . hydrOXYzine (ATARAX/VISTARIL) 50 MG tablet TAKE 1 TABLET (50 MG TOTAL) BY MOUTH 2 (TWO) TIMES DAILY AS NEEDED FOR ANXIETY. 180 tablet 1  . metFORMIN (GLUCOPHAGE) 1000 MG tablet TAKE 1 TABLET (1,000 MG TOTAL) BY MOUTH 2 (TWO) TIMES DAILY WITH A MEAL. 180 tablet 1  . montelukast (SINGULAIR) 10 MG tablet Take 1 tablet (10 mg total) by mouth at bedtime. 30 tablet 11  . ONETOUCH VERIO test strip USE AS INSTRUCTED TO CHECK THREE TIMES DAILY AND AS NEEDED. E11.8 300 each 3  . Probiotic Product (PROBIOTIC DAILY PO) Take by mouth.    . simvastatin (ZOCOR) 20 MG tablet TAKE 1 TABLET (20 MG TOTAL) BY MOUTH EVERY EVENING. 90 tablet 3  . pantoprazole (PROTONIX) 40 MG tablet Take 1  tablet (40 mg total) by mouth daily. 30 tablet 11  . tiotropium (SPIRIVA HANDIHALER) 18 MCG inhalation capsule Place 1 capsule (18 mcg total) into inhaler and inhale daily. 30 capsule 2   No facility-administered medications prior to visit.      Per HPI unless specifically indicated in ROS section below Review of Systems Objective:    BP 126/72 (BP Location: Left Arm, Patient Position: Sitting, Cuff Size: Normal)   Pulse 77   Temp 98.2 F (36.8 C) (Oral)   Ht '5\' 10"'  (1.778 m)   Wt 274 lb (124.3 kg)   SpO2 94%   BMI 39.31 kg/m   Wt Readings from Last 3 Encounters:  09/19/18 274 lb (124.3 kg)  07/25/18 268 lb (121.6 kg)  07/04/18 265 lb  6.4 oz (120.4 kg)    Physical Exam Vitals signs and nursing note reviewed.  Constitutional:      Appearance: Normal appearance. He is not ill-appearing.  HENT:     Head: Normocephalic and atraumatic.     Mouth/Throat:     Mouth: Mucous membranes are moist.     Pharynx: No posterior oropharyngeal erythema.  Neck:     Musculoskeletal: Normal range of motion and neck supple.  Cardiovascular:     Rate and Rhythm: Normal rate and regular rhythm.     Pulses: Normal pulses.     Heart sounds: Normal heart sounds. No murmur.  Pulmonary:     Effort: Pulmonary effort is normal. No respiratory distress.     Breath sounds: Normal breath sounds. No wheezing, rhonchi or rales.  Abdominal:     General: Abdomen is flat. Bowel sounds are normal. There is no distension.     Palpations: Abdomen is soft. There is no mass.     Tenderness: There is generalized abdominal tenderness (mild). There is no right CVA tenderness, left CVA tenderness, guarding or rebound.     Hernia: A hernia is present. Hernia is present in the umbilical area.  Musculoskeletal:     Right lower leg: No edema.     Left lower leg: No edema.  Lymphadenopathy:     Cervical: No cervical adenopathy.  Neurological:     Mental Status: He is alert.  Psychiatric:        Mood and Affect: Mood normal.        Behavior: Behavior normal.       Results for orders placed or performed in visit on 06/16/18  Brain natriuretic peptide  Result Value Ref Range   Pro B Natriuretic peptide (BNP) 11.0 0.0 - 100.0 pg/mL   Lab Results  Component Value Date   HGBA1C 7.1 (H) 05/24/2018    EKG - NSR rate 70s with rate variation, normal axis, no acute ST/T changes.  Assessment & Plan:   Problem List Items Addressed This Visit    Umbilical hernia without obstruction and without gangrene   Type II diabetes mellitus ([place])    Has noted worsened sugar control related to recent dietary indiscretions - motivated to return to low sugar low carb diet.  Update A1c today.       Relevant Orders   Hemoglobin A1c   Severe obesity (BMI 35.0-39.9) with comorbidity ([place])   Gassiness   Epigastric abdominal pain - Primary    Anticipate gastritis /GERD related despite protonix. Will double up on protonix, add pepcid to regimen for next 10 days. He has felt better since having bowel movement yesterday. Reviewed importance of regular BM daily - he has started  stool softener as well.  Check EKG today with associated chest discomfort in setting of known DM, CAD and CHF - no acute findings other than rate variability.      Relevant Orders   Comprehensive metabolic panel   CBC with Differential/Platelet   Lipase   Coronary artery disease    Other Visit Diagnoses    Chest discomfort       Relevant Orders   EKG 12-Lead (Completed)       Meds ordered this encounter  Medications  . pantoprazole (PROTONIX) 40 MG tablet    Sig: Take 1 tablet (40 mg total) by mouth 2 (two) times daily before a meal.    Dispense:  30 tablet    Refill:  0  . pantoprazole (PROTONIX) 40 MG tablet    Sig: Take 1 tablet (40 mg total) by mouth daily.    Dispense:  30 tablet    Refill:  11    Restart 50m once daily after 15d BID course   Orders Placed This Encounter  Procedures  . Comprehensive metabolic panel  . Hemoglobin A1c  . CBC with Differential/Platelet  . Lipase  . EKG 12-Lead    Follow up plan: Return if symptoms worsen or fail to improve.  JRia Bush MD

## 2018-09-19 NOTE — Telephone Encounter (Signed)
Pt called with complaints of severe pain in the pit of his stomach, and abdominal and chest pain since 09/12/2018; the pt says that he called on 09/15/2018 but did not want to do a virtual appointment; the pt says that not he has not had a bowel movement since 09/17/2018; he also says that he is having nausea, and the pain feels like he has tremendous gas; he has taken gas pills and stool softener with no relief; he rates his pain at 5 out of 10; the pt says that the gas pain builds up in his stomach and causes the chest pain; the pt says that originally he would get winded going from spot to spot but now it progressed to this; recommendations made per nurse triage protocol; he verbalizes understanding; explained to pt that it is the it is the MD's decision whether the appointment is in the office vs virtual; he verbalizes understanding; attmepted to contact [place] without success; left message on voicemail to contact the pt at [phone]; will route to office for notification.  Reason for Disposition . Age > 60 years  Answer Assessment - Initial Assessment Questions 1. LOCATION: "Where does it hurt?"      Pit of stomach 2. RADIATION: "Does the pain shoot anywhere else?" (e.g., chest, back)   chest 3. ONSET: "When did the pain begin?" (Minutes, hours or days ago)     09/12/2018 4. SUDDEN: "Gradual or sudden onset?"    sudden 5. PATTERN "Does the pain come and go, or is it constant?"    - If constant: "Is it getting better, staying the same, or worsening?"      (Note: Constant means the pain never goes away completely; most serious pain is constant and it progresses)     - If intermittent: "How long does it last?" "Do you have pain now?"     (Note: Intermittent means the pain goes away completely between bouts)     constant 6. SEVERITY: "How bad is the pain?"  (e.g., Scale 1-10; mild, moderate, or severe)    - MILD (1-3): doesn't interfere with normal activities, abdomen soft and not  tender to touch     - MODERATE (4-7): interferes with normal activities or awakens from sleep, tender to touch     - SEVERE (8-10): excruciating pain, doubled over, unable to do any normal activities       5 out of 10 7. RECURRENT SYMPTOM: "Have you ever had this type of abdominal pain before?" If so, ask: "When was the last time?" and "What happened that time?"      Yes gas relate; happened years ago 8. CAUSE: "What do you think is causing the abdominal pain?"    ? gas 9. RELIEVING/AGGRAVATING FACTORS: "What makes it better or worse?" (e.g., movement, antacids, bowel movement)     Stool softener started to help before 09/18/2018, gas pills 10. OTHER SYMPTOMS: "Has there been any vomiting, diarrhea, constipation, or urine problems?"       Nausea, urine problems: not going as frequently, constipation  Protocols used: ABDOMINAL PAIN - MALE-A-AH

## 2018-09-19 NOTE — Assessment & Plan Note (Signed)
Has noted worsened sugar control related to recent dietary indiscretions - motivated to return to low sugar low carb diet. Update A1c today.

## 2018-10-19 ENCOUNTER — Other Ambulatory Visit: Payer: Self-pay | Admitting: Family Medicine

## 2018-10-30 ENCOUNTER — Telehealth: Payer: Self-pay | Admitting: *Deleted

## 2018-10-30 NOTE — Telephone Encounter (Signed)
Attempted to contact regarding scheduling lung screening scan however, person answering phone is not the patient but says he will have the patient call back.

## 2018-10-31 ENCOUNTER — Telehealth: Payer: Self-pay | Admitting: *Deleted

## 2018-10-31 DIAGNOSIS — Z87891 Personal history of nicotine dependence: Secondary | ICD-10-CM

## 2018-10-31 DIAGNOSIS — Z122 Encounter for screening for malignant neoplasm of respiratory organs: Secondary | ICD-10-CM

## 2018-10-31 NOTE — Telephone Encounter (Signed)
Received referral for initial lung cancer screening scan. Contacted patient and obtained smoking history,(former, quit 04/26/18, 31 pack year) as well as answering questions related to screening process. Patient denies signs of lung cancer such as weight loss or hemoptysis. Patient denies comorbidity that would prevent curative treatment if lung cancer were found. Patient is scheduled for shared decision making visit and CT scan on 11/07/18 at 1115am.

## 2018-11-06 ENCOUNTER — Encounter: Payer: Self-pay | Admitting: Oncology

## 2018-11-06 ENCOUNTER — Other Ambulatory Visit: Payer: Self-pay | Admitting: Family Medicine

## 2018-11-07 ENCOUNTER — Other Ambulatory Visit: Payer: Self-pay

## 2018-11-07 ENCOUNTER — Ambulatory Visit
Admission: RE | Admit: 2018-11-07 | Discharge: 2018-11-07 | Disposition: A | Discharge status: Home / Self Care | Payer: Medicare Other | Source: Ambulatory Visit | Attending: Oncology | Admitting: Oncology

## 2018-11-07 ENCOUNTER — Inpatient Hospital Stay: Payer: Medicare Other | Attending: Oncology | Admitting: Nurse Practitioner

## 2018-11-07 DIAGNOSIS — Z87891 Personal history of nicotine dependence: Secondary | ICD-10-CM | POA: Diagnosis not present

## 2018-11-07 DIAGNOSIS — Z122 Encounter for screening for malignant neoplasm of respiratory organs: Secondary | ICD-10-CM | POA: Diagnosis not present

## 2018-11-07 NOTE — Progress Notes (Signed)
Virtual Visit via Video Enabled Telemedicine Note   I connected with Cecile Guevara Seehafer on 11/07/18 at 11:15 AM EST by video enabled telemedicine visit and verified that I am speaking with the correct person using two identifiers.   I discussed the limitations, risks, security and privacy concerns of performing an evaluation and management service by telemedicine and the availability of in-person appointments. I also discussed with the patient that there may be a patient responsible charge related to this service. The patient expressed understanding and agreed to proceed.   Other persons participating in the visit and their role in the encounter: Burgess Estelle, RN- checking in patient & navigation  Patient's location: clinic  Provider's location: home  Chief Complaint: Low Dose CT Screening  Patient agreed to evaluation by telemedicine to discuss shared decision making for consideration of low dose CT lung cancer screening.    In accordance with CMS guidelines, patient has met eligibility criteria including age, absence of signs or symptoms of lung cancer.  Social History   Tobacco Use  . Smoking status: Former Smoker    Packs/day: 1.00    Years: 31.00    Pack years: 31.00    Types: Cigarettes    Quit date: 04/26/2018    Years since quitting: 0.5  . Smokeless tobacco: Never Used  Substance Use Topics  . Alcohol use: No    Alcohol/week: 0.0 standard drinks     A shared decision-making session was conducted prior to the performance of CT scan. This includes one or more decision aids, includes benefits and harms of screening, follow-up diagnostic testing, over-diagnosis, false positive rate, and total radiation exposure.   Counseling on the importance of adherence to annual lung cancer LDCT screening, impact of co-morbidities, and ability or willingness to undergo diagnosis and treatment is imperative for compliance of the program.   Counseling on the importance of continued smoking  cessation for former smokers; the importance of smoking cessation for current smokers, and information about tobacco cessation interventions have been given to patient including [facility] and [employer] [place] programs.   Written order for lung cancer screening with LDCT has been given to the patient and any and all questions have been answered to the best of my abilities.    Yearly follow up will be coordinated by Burgess Estelle, Thoracic Navigator.  I discussed the assessment and treatment plan with the patient. The patient was provided an opportunity to ask questions and all were answered. The patient agreed with the plan and demonstrated an understanding of the instructions.   The patient was advised to call back or seek an in-person evaluation if the symptoms worsen or if the condition fails to improve as anticipated.   I provided 15 minutes of face-to-face video visit time during this encounter, and > 50% was spent counseling as documented under my assessment & plan.   Beckey Rutter, DNP, AGNP-C [place] [facility] [phone] (work cell) [phone] (office)

## 2018-11-08 ENCOUNTER — Telehealth: Payer: Self-pay | Admitting: *Deleted

## 2018-11-08 NOTE — Telephone Encounter (Signed)
Notified patient of LDCT lung cancer screening program results with recommendation for 12 month follow up imaging. Also notified of incidental findings noted below and is encouraged to discuss further with PCP who will receive a copy of this note and/or the CT report. Patient verbalizes understanding.   IMPRESSION: Lung-RADS 2, benign appearance or behavior. Continue annual screening with low-dose chest CT without contrast in 12 months.  Aortic Atherosclerosis (ICD10-I70.0) and Emphysema (ICD10-J43.9).

## 2018-11-10 ENCOUNTER — Telehealth: Payer: Self-pay | Admitting: *Deleted

## 2018-11-10 DIAGNOSIS — J9859 Other diseases of mediastinum, not elsewhere classified: Secondary | ICD-10-CM

## 2018-11-10 NOTE — Telephone Encounter (Signed)
Do you need me to place orders for PET

## 2018-11-10 NOTE — Telephone Encounter (Signed)
After review of lung screening results with thoracic surgery and PCP patient is notified that recommendation is for a PET scan and evaluation by thoracic surgery for mediastinal finding. Patient is in agreement with this plan and will receive appointments later today.

## 2018-11-15 ENCOUNTER — Telehealth: Payer: Self-pay

## 2018-11-15 NOTE — Telephone Encounter (Signed)
Spoke with patient and he does not want to have the PET scan done. He does not want to keep the appointment with Dr.Oaks- we discussed  financial program and he was asked to call and have paperwork mailed.   I spoke with Hildred Alamin and she is going to check on the Pet scan authorization and call me back later today.

## 2018-11-16 ENCOUNTER — Telehealth: Payer: Self-pay | Admitting: *Deleted

## 2018-11-16 ENCOUNTER — Ambulatory Visit: Payer: Medicare Other

## 2018-11-16 NOTE — Telephone Encounter (Signed)
PET scan scheduled 11/22/2018 per Hildred Alamin at [facility] Dr.Oaks 12/01/2018 @ 10:30 am., arrive 10:15 to complete paperwork.

## 2018-11-16 NOTE — Telephone Encounter (Signed)
Authorization received from insurance for PET scan. PET rescheduled to 7/29 at 10:30a, [facility]. Prep instructions given to patient. Pt states that he would like to follow up with Dr. Genevive Bi after the first of the month since he will be getting paid at that time and will be able to pay his copay.   Message sent to Dr. Genevive Bi staff to assist in getting pt rescheduled to see Dr. Genevive Bi after the Aug 1st. Pt informed that Dr. Genevive Bi staff will notify him with his appt.   Pt verbalized understanding. Nothing further needed at this time.

## 2018-11-17 ENCOUNTER — Ambulatory Visit: Payer: Self-pay | Admitting: Cardiothoracic Surgery

## 2018-11-17 NOTE — Telephone Encounter (Signed)
Patient notified of 12/01/2018 @ 10:15 am with Dr.Oaks.

## 2018-11-17 NOTE — Telephone Encounter (Signed)
Patient is calling you back asking for you to give him a call. Please call

## 2018-11-20 ENCOUNTER — Other Ambulatory Visit: Payer: Self-pay | Admitting: Family Medicine

## 2018-11-21 NOTE — Telephone Encounter (Signed)
E-scribed refill.  Plz schedule medicare wellness and labs.

## 2018-11-22 ENCOUNTER — Encounter
Admission: RE | Admit: 2018-11-22 | Discharge: 2018-11-22 | Disposition: A | Discharge status: Home / Self Care | Payer: Medicare Other | Source: Ambulatory Visit | Attending: Oncology | Admitting: Oncology

## 2018-11-22 ENCOUNTER — Other Ambulatory Visit: Payer: Self-pay

## 2018-11-22 DIAGNOSIS — K760 Fatty (change of) liver, not elsewhere classified: Secondary | ICD-10-CM | POA: Diagnosis not present

## 2018-11-22 DIAGNOSIS — R911 Solitary pulmonary nodule: Secondary | ICD-10-CM | POA: Diagnosis not present

## 2018-11-22 DIAGNOSIS — R918 Other nonspecific abnormal finding of lung field: Secondary | ICD-10-CM | POA: Diagnosis not present

## 2018-11-22 DIAGNOSIS — J9859 Other diseases of mediastinum, not elsewhere classified: Secondary | ICD-10-CM | POA: Diagnosis not present

## 2018-11-22 DIAGNOSIS — R222 Localized swelling, mass and lump, trunk: Secondary | ICD-10-CM | POA: Diagnosis not present

## 2018-11-22 LAB — GLUCOSE, CAPILLARY: Glucose-Capillary: 184 mg/dL — ABNORMAL HIGH (ref 70–99)

## 2018-11-22 MED ORDER — FLUDEOXYGLUCOSE F - 18 (FDG) INJECTION
14.6000 | Freq: Once | INTRAVENOUS | Status: AC | PRN
  Administered 2018-11-22: 14.58 via INTRAVENOUS

## 2018-11-22 NOTE — Telephone Encounter (Signed)
Pt scheduled for labs on 01/31/19 and medicare wellness with Dr. Darnell Level on 02/07/19. Pt states he was at the hospital now and he had appt with another dr on 12/01/18 so he wanted to push this appt out.

## 2018-11-23 NOTE — Progress Notes (Signed)
FYI

## 2018-11-28 ENCOUNTER — Ambulatory Visit: Payer: Self-pay

## 2018-11-28 NOTE — Telephone Encounter (Signed)
Patient called and says since he had a PET scan on 11/22/18 he's been having SOB. He says his breathing is ok when he's resting, but when he gets up moving around, it's worse. He says he's not coughing, no fever, no wheezing. He speaks in complete sentences. He says he wants to make sure he's ok before going on a trip to [place]. He says he has gas feeling in his chest and he's taking medication for it. I called the office and spoke to [facility], LPN who asked to speak to the patient, the call was connected successfully.  Answer Assessment - Initial Assessment Questions 1. RESPIRATORY STATUS: "Describe your breathing?" (e.g., wheezing, shortness of breath, unable to speak, severe coughing)      Shortness of breath 2. ONSET: "When did this breathing problem begin?"      Since I had the PET scan on 11/22/18 3. PATTERN "Does the difficult breathing come and go, or has it been constant since it started?"      Not as bad resting, but as soon as get up doing something it's worse 4. SEVERITY: "How bad is your breathing?" (e.g., mild, moderate, severe)    - MILD: No SOB at rest, mild SOB with walking, speaks normally in sentences, can lay down, no retractions, pulse < 100.    - MODERATE: SOB at rest, SOB with minimal exertion and prefers to sit, cannot lie down flat, speaks in phrases, mild retractions, audible wheezing, pulse 100-120.    - SEVERE: Very SOB at rest, speaks in single words, struggling to breathe, sitting hunched forward, retractions, pulse > 120      Mild to Moderate 5. RECURRENT SYMPTOM: "Have you had difficulty breathing before?" If so, ask: "When was the last time?" and "What happened that time?"      Yes, similar feeling before, not sure what happened that time 6. CARDIAC HISTORY: "Do you have any history of heart disease?" (e.g., heart attack, angina, bypass surgery, angioplasty)      Yes 7. LUNG HISTORY: "Do you have any history of lung disease?"  (e.g., pulmonary embolus, asthma, emphysema)  No 8. CAUSE: "What do you think is causing the breathing problem?"      I don't know 9. OTHER SYMPTOMS: "Do you have any other symptoms? (e.g., dizziness, runny nose, cough, chest pain, fever)     Gas feeling in the chest at times 10. PREGNANCY: "Is there any chance you are pregnant?" "When was your last menstrual period?"       N/A 11. TRAVEL: "Have you traveled out of the country in the last month?" (e.g., travel history, exposures)      No  Protocols used: BREATHING DIFFICULTY-A-AH

## 2018-11-28 NOTE — Telephone Encounter (Signed)
I spoke with DR G who said I could bring pt in tomorrow morning. I spoke with pt and pt scheduled appt with Dr Darnell Level on 11/29/18 at 9:15. ED precautions given and pt voiced understanding. Pt did apologize for getting frustrated and hanging up when we were talking before. I advised pt no need to apologize and I hoped pt felt better.  FYI to DR G.

## 2018-11-28 NOTE — Telephone Encounter (Signed)
Noted. Will see tomorrow. H/o chronic dyspnea and intermittent headaches - this does not sound like Covid.

## 2018-11-28 NOTE — Telephone Encounter (Signed)
Lattie Haw nurse with [facility] transferred pt to me; since PET scan on 11/22/18 pt has SOB when moves around and heart races along with flutter in chest. Pt has tightness on both sides of upper chest at the shoulders. H/A on and off and last H/A last night. Pt has a cough when first wakes up in mornings for very short period.No other covid symptoms, no travel and no known exposure to covid. Advised pt would need to go to UC to be evaluated and pt wanted to know why; I explained I cannot offer appt at [facility] due to SOB,cough and H/A which could be possible covid symptoms. Pt said where is he supposed to go and I advised I could direct him to an UC. Pt said he was getting frustrated, forget it and he would stay at home and die. Pt hung up phone while I was talking. FYI to Dr Darnell Level for further guidance.

## 2018-11-29 ENCOUNTER — Encounter: Payer: Self-pay | Admitting: Family Medicine

## 2018-11-29 ENCOUNTER — Other Ambulatory Visit: Payer: Self-pay

## 2018-11-29 ENCOUNTER — Ambulatory Visit (INDEPENDENT_AMBULATORY_CARE_PROVIDER_SITE_OTHER): Payer: Medicare Other | Admitting: Family Medicine

## 2018-11-29 DIAGNOSIS — R0609 Other forms of dyspnea: Secondary | ICD-10-CM | POA: Diagnosis not present

## 2018-11-29 DIAGNOSIS — R140 Abdominal distension (gaseous): Secondary | ICD-10-CM | POA: Diagnosis not present

## 2018-11-29 MED ORDER — HYDROXYZINE HCL 50 MG PO TABS: 50.0000 mg | ORAL_TABLET | Freq: Two times a day (BID) | ORAL | 1 refills | Status: DC | PRN

## 2018-11-29 MED ORDER — ALBUTEROL SULFATE HFA 108 (90 BASE) MCG/ACT IN AERS: 2.0000 | INHALATION_SPRAY | Freq: Four times a day (QID) | RESPIRATORY_TRACT | 1 refills | Status: DC | PRN

## 2018-11-29 NOTE — Progress Notes (Signed)
This visit was conducted in person.  BP 120/80 (BP Location: Left Arm, Patient Position: Sitting, Cuff Size: Normal)   Pulse 74   Temp 97.8 F (36.6 C) (Tympanic)   Ht _0  (1.778 m)   SpO2 96%   BMI 40.46 kg/m    CC: dyspnea Subjective:    Patient ID: [name], male    DOB: Sep 05, 1955, 63 y.o.   MRN: [id number]  HPI: [name] is a 63 y.o. male presenting on 11/29/2018 for Shortness of Breath (C/o SOB on exertion.  Says coughs when first getting out of bed. Denies chest pain. Sxs started 11/23/18 after PET scan. 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 Further evaluation with PET scan last week showing no hypermetabolic activity in this 2.3cm lesion. Thought benign, r/o well differentiated or low-grade neoplasm - rec continued surveillance.   Also noted CAC and ATH calcification of thoracic and abdominal aorta.  Did have 14.6 mCi F-18 FDG administered for PET scan but didn't have any reactions to contrast at the time of PET scan.   Had difficulty with pet scan - laying on back as well as raising arms above head. Got home and mowed grass - noted trouble with dyspnea. Since PET scan, noticing increasing exertional dyspnea. Noticing abdominal discomfort gassiness and bloating feeling. Has tried pepcid and pepto bismol along with his regular protonix with some improvement. Does have some R maxillary discomfort as ewll - managing with flonase. Albuterol hasn't really helped dyspnea. Normally 1-2 BM/day. No diet changes.   No fevers/chills, cough, loss of taste/smell, ST.   Known chronic exertional dyspnea present since 2018 s/p reassuring cards evaluation, has also been evaluated by pulmonology. Known chronic bronchitis - unable to afford spiriva.      Relevant past medical, surgical, family and social history reviewed and updated as indicated.  Interim medical history since our last visit reviewed. Allergies and medications reviewed and updated. Outpatient Medications Prior to Visit  Medication Sig Dispense Refill  . ACCU-CHEK FASTCLIX LANCETS MISC Check blood sugar once daily and as instructed. Dx 250.00 100 each 3  . aspirin EC 81 MG tablet Take 81 mg by mouth daily.    . Blood Glucose Monitoring Suppl (BLOOD GLUCOSE MONITOR SYSTEM) W/DEVICE KIT by Does not apply route. 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 carvedilol (COREG) 6.25 MG tablet TAKE 1 TABLET BY MOUTH 2 TIMES DAILY WITH A MEAL. 180 tablet 1  . citalopram (CELEXA) 10 MG tablet TAKE 1 TABLET BY MOUTH EVERY DAY 90 tablet 1  . fluticasone (FLONASE) 50 MCG/ACT nasal spray SPRAY 2 SPRAYS INTO EACH NOSTRIL EVERY DAY 16 g 3  . furosemide (LASIX) 20 MG tablet TAKE 1 TABLET (20 MG TOTAL) BY MOUTH 2 (TWO) TIMES DAILY AS NEEDED. 180 tablet 0  . glimepiride (AMARYL) 2 MG tablet TAKE 1 TABLET (2 MG TOTAL) BY MOUTH DAILY WITH BREAKFAST. 90 tablet 1  . metFORMIN (GLUCOPHAGE) 1000 MG tablet TAKE 1 TABLET (1,000 MG TOTAL) BY MOUTH 2 (TWO) TIMES DAILY WITH A MEAL. 180 tablet 1  . montelukast (SINGULAIR) 10 MG tablet Take 1 tablet (10 mg total) by mouth at bedtime. 30 tablet 11  . ONETOUCH VERIO test strip USE AS INSTRUCTED TO CHECK THREE TIMES DAILY AND AS NEEDED. E11.8 300 each 3  . pantoprazole (PROTONIX) 40  MG tablet TAKE 1 TABLET (40 MG TOTAL) BY MOUTH 2 (TWO) TIMES DAILY BEFORE A MEAL. 180 tablet 0  . Probiotic Product (PROBIOTIC DAILY PO) Take by mouth.    . simvastatin (ZOCOR) 20 MG tablet TAKE 1 TABLET (20 MG TOTAL) BY MOUTH EVERY EVENING. 90 tablet 3  . hydrOXYzine (ATARAX/VISTARIL) 50 MG tablet TAKE 1 TABLET (50 MG TOTAL) BY MOUTH 2 (TWO) TIMES DAILY AS NEEDED FOR ANXIETY. 180 tablet 1  . pantoprazole (PROTONIX) 40 MG tablet Take 1 tablet (40 mg total) by mouth daily. 30 tablet 11   No facility-administered medications prior to visit.       Per HPI unless specifically indicated in ROS section below Review of Systems Objective:    BP 120/80 (BP Location: Left Arm, Patient Position: Sitting, Cuff Size: Normal)   Pulse 74   Temp 97.8 F (36.6 C) (Tympanic)   Ht _0  (1.778 m)   SpO2 96%   BMI 40.46 kg/m   Wt Readings from Last 3 Encounters:  11/07/18 282 lb (127.9 kg)  09/19/18 274 lb (124.3 kg)  07/25/18 268 lb (121.6 kg)    Physical Exam Vitals signs and nursing note reviewed.  Constitutional:      General: He is not in acute distress.    Appearance: Normal appearance. He is not ill-appearing.  HENT:     Mouth/Throat:     Mouth: Mucous membranes are moist.     Pharynx: No posterior oropharyngeal erythema.  Eyes:     Extraocular Movements: Extraocular movements intact.     Pupils: Pupils are equal, round, and reactive to light.  Cardiovascular:     Rate and Rhythm: Normal rate and regular rhythm.     Pulses: Normal pulses.     Heart sounds: Normal heart sounds. No murmur.  Pulmonary:     Effort: Pulmonary effort is normal. No respiratory distress.     Breath sounds: Normal breath sounds. No wheezing, rhonchi or rales.  Abdominal:     General: Abdomen is flat. Bowel sounds are normal. There is distension (chronic).     Palpations: Abdomen is soft. There is no mass.     Tenderness: There is generalized abdominal tenderness (mild). There is no right CVA tenderness, left CVA tenderness, guarding or rebound. Negative signs include Murphy's sign.     Hernia: No hernia is present.  Musculoskeletal:     Right lower leg: No edema.     Left lower leg: No edema.  Skin:    General: Skin is warm and dry.  Neurological:     Mental Status: He is alert.       Lab Results  Component Value Date   HGBA1C 7.5 (H) 09/19/2018   Assessment & Plan:   Problem List Items Addressed This Visit    Gassiness    Worsened gassiness/bloating over the last week - despite pepcid, PPI daily, tums and pepto bismol. Advised  start [place] as well as increase PPI to BID (had recently decreased back to daily dosing - which may have contributed to worsening symptoms).       Exertional dyspnea    Story/exam not consistent with COVID. Chronic issue, feels similar to prior. Partly related to COPD, obesity and deconditioning. Anticipate today's symptoms largely related to gassiness/bloating - see above. Hasn't been able to afford spiriva. I have refilled rescue inhaler albuterol to use PRN. No relation to recent PET scan.           Meds ordered this encounter  Medications  . albuterol (VENTOLIN HFA) 108 (90 Base) MCG/ACT inhaler    Sig: Inhale 2 puffs into the lungs every 6 (six) hours as needed for wheezing or shortness of breath.    Dispense:  18 g    Refill:  1    Formulate based on cost  . hydrOXYzine (ATARAX/VISTARIL) 50 MG tablet    Sig: Take 1 tablet (50 mg total) by mouth 2 (two) times daily as needed for anxiety.    Dispense:  180 tablet    Refill:  1   No orders of the defined types were placed in this encounter.  Patient Instructions  I agree gassiness is contributing to chest symptoms and shortness of breath. Continue pepcid, pantoprazole and add gasX to regimen.  Continue albuterol as needed.  Start stool softener.  Let us know if not improving or any new or worsening symptoms.     Follow up plan: Return if symptoms worsen or fail to improve.  [name], MD

## 2018-11-29 NOTE — Assessment & Plan Note (Addendum)
Story/exam not consistent with COVID. Chronic issue, feels similar to prior. Partly related to COPD, obesity and deconditioning. Anticipate today's symptoms largely related to gassiness/bloating - see above. Hasn't been able to afford spiriva. I have refilled rescue inhaler albuterol to use PRN. No relation to recent PET scan.

## 2018-11-29 NOTE — Assessment & Plan Note (Addendum)
Worsened gassiness/bloating over the last week - despite pepcid, PPI daily, tums and pepto bismol. Advised start [place] as well as increase PPI to BID (had recently decreased back to daily dosing - which may have contributed to worsening symptoms).

## 2018-11-29 NOTE — Patient Instructions (Addendum)
I agree gassiness is contributing to chest symptoms and shortness of breath. Continue pepcid, pantoprazole and add gasX to regimen.  Continue albuterol as needed.  Start stool softener.  Let us know if not improving or any new or worsening symptoms.

## 2018-11-30 ENCOUNTER — Other Ambulatory Visit: Payer: Self-pay | Admitting: Family Medicine

## 2018-11-30 ENCOUNTER — Telehealth: Payer: Self-pay

## 2018-11-30 NOTE — Telephone Encounter (Signed)
Left message for patient to come to appointment tomorrow @ 10:30per Dr.Oaks. If he cannot afford copay bill patient or except what he can pay. Patient needs to view images with Dr.Oaks.

## 2018-12-01 ENCOUNTER — Telehealth: Payer: Self-pay | Admitting: Cardiothoracic Surgery

## 2018-12-10 ENCOUNTER — Other Ambulatory Visit: Payer: Self-pay | Admitting: Family Medicine

## 2018-12-12 DIAGNOSIS — E119 Type 2 diabetes mellitus without complications: Secondary | ICD-10-CM | POA: Diagnosis not present

## 2018-12-15 ENCOUNTER — Other Ambulatory Visit: Payer: Self-pay | Admitting: Family Medicine

## 2018-12-15 ENCOUNTER — Encounter: Payer: Self-pay | Admitting: Cardiothoracic Surgery

## 2018-12-15 ENCOUNTER — Other Ambulatory Visit: Payer: Self-pay

## 2018-12-15 ENCOUNTER — Ambulatory Visit (INDEPENDENT_AMBULATORY_CARE_PROVIDER_SITE_OTHER): Payer: Medicare Other | Admitting: Cardiothoracic Surgery

## 2018-12-15 VITALS — BP 162/96 | HR 81 | Temp 97.5°F | Resp 18 | Ht 71.0 in | Wt 289.0 lb

## 2018-12-15 DIAGNOSIS — J9859 Other diseases of mediastinum, not elsewhere classified: Secondary | ICD-10-CM | POA: Diagnosis not present

## 2018-12-15 NOTE — Patient Instructions (Addendum)
WE will reach out to you to schedule your CT chest scan 6 months  February 2021.

## 2018-12-15 NOTE — Progress Notes (Signed)
Patient ID: [name], male   DOB: 02/13/1956, 63 y.o.   MRN: [id number]  Chief Complaint  Patient presents with  . Follow-up    Bil pulmonary nodules    Referred By Dr. Danise Mina Reason for Referral mediastinal mass  HPI Location, Quality, Duration, Severity, Timing, Context, Modifying Factors, Associated Signs and Symptoms.  [name] is a 63 y.o. male.  This is a 63 year old gentleman who presented to his primary care physician with complaints of shortness of breath he states that over the last couple years he has been getting more more short of breath with exertion particularly whenever he has to carry any heavy loads for a distance.  As part of his evaluation he had a CT scan performed.  This revealed an approximately 2 cm mass in the anterior mediastinum.  There is some calcification of the rim of the mass.  A subsequent PET scan was performed which did not reveal any uptake in this area.  The patient is referred here for further evaluation.  He has a smoking history of approximately 25 years.  This is been off and on and he has quit for up to 15 years during that timeframe.  He most recently has been quit since January 2020.  He does have a positive family history of lung cancer and an older brother who smoked.  He denies any weight loss.  He did see Dr. Jolyn Lent and he says that some breathing studies were done although we do not have those available at this time.   Past Medical History:  Diagnosis Date  . Abnormal drug screen 2015   MJ positive x3 - one more and we will stop prescribing ativan (08/2013).  . Angina   . Anxiety   . Arthritis   . CAD (coronary artery disease)    nonobstructive  . Cannabis abuse   . Chronic systolic CHF (congestive heart failure) ([place]) 2013   NYHA Class II/III  . COPD (chronic obstructive pulmonary disease) ([facility])   . Diabetes type 2, uncontrolled ([place])    declines DSME  . Dilated cardiomyopathy ([place]) 2013   now improved  .  Ex-smoker   . Frequent headaches   . History of atrial fibrillation 2013   chronic, s/p ablation prior on coumadin  . Hyperlipidemia   . Hypertension   . Migraine   . Nonischemic cardiomyopathy ([place]) 2013   EF 25% per Dr Nehemiah Massed  . Obesity   . OSA (obstructive sleep apnea)    does not use CPAP - unable to tolerate  . Seasonal allergies     Past Surgical History:  Procedure Laterality Date  . ATRIAL FLUTTER ABLATION N/A 09/21/2011   Procedure: ATRIAL FLUTTER ABLATION;  Surgeon: Thompson Grayer, MD;  Location: [facility] CATH LAB;  Service: Cardiovascular;  Laterality: N/A;  . CARDIAC ELECTROPHYSIOLOGY [place] AND ABLATION  2013   for atrial flutter  . CARDIOVASCULAR STRESS TEST  03/2012   ETT WNL Nehemiah Massed)  . US ECHOCARDIOGRAPHY  2014   EF 50%, nl LV fxn, RV nl size/function, mild mitral insuff    Family History  Problem Relation Age of Onset  . Heart failure Father 16  . Cancer Mother 2       NHL  . Hypertension Maternal Grandfather   . CAD Maternal Grandfather 40       MI  . Diabetes Other        grandparents  . Cancer Brother 50       lung (smoker)  Social History Social History   Tobacco Use  . Smoking status: Former Smoker    Packs/day: 1.00    Years: 31.00    Pack years: 31.00    Types: Cigarettes    Quit date: 04/26/2018    Years since quitting: 0.6  . Smokeless tobacco: Never Used  Substance Use Topics  . Alcohol use: No    Alcohol/week: 0.0 standard drinks  . Drug use: Yes    Types: Marijuana    Allergies  Allergen Reactions  . Atorvastatin Other (See Comments)    Dizziness  . Lisinopril Cough    Current Outpatient Medications  Medication Sig Dispense Refill  . ACCU-CHEK FASTCLIX LANCETS MISC Check blood sugar once daily and as instructed. Dx 250.00 100 each 3  . albuterol (VENTOLIN HFA) 108 (90 Base) MCG/ACT inhaler Inhale 2 puffs into the lungs every 6 (six) hours as needed for wheezing or shortness of breath. 18 g 1  . aspirin EC 81 MG tablet  Take 81 mg by mouth daily.    . Blood Glucose Monitoring Suppl (BLOOD GLUCOSE MONITOR SYSTEM) W/DEVICE KIT by Does not apply route. 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 carvedilol (COREG) 6.25 MG tablet TAKE 1 TABLET BY MOUTH 2 TIMES DAILY WITH A MEAL. 180 tablet 0  . citalopram (CELEXA) 10 MG tablet TAKE 1 TABLET BY MOUTH EVERY DAY 90 tablet 1  . fluticasone (FLONASE) 50 MCG/ACT nasal spray SPRAY 2 SPRAYS INTO EACH NOSTRIL EVERY DAY 16 g 3  . furosemide (LASIX) 20 MG tablet TAKE 1 TABLET (20 MG TOTAL) BY MOUTH 2 (TWO) TIMES DAILY AS NEEDED. 180 tablet 0  . glimepiride (AMARYL) 2 MG tablet TAKE 1 TABLET (2 MG TOTAL) BY MOUTH DAILY WITH BREAKFAST. 90 tablet 1  . hydrOXYzine (ATARAX/VISTARIL) 50 MG tablet Take 1 tablet (50 mg total) by mouth 2 (two) times daily as needed for anxiety. 180 tablet 1  . metFORMIN (GLUCOPHAGE) 1000 MG tablet TAKE 1 TABLET (1,000 MG TOTAL) BY MOUTH 2 (TWO) TIMES DAILY WITH A MEAL. 180 tablet 1  . montelukast (SINGULAIR) 10 MG tablet Take 1 tablet (10 mg total) by mouth at bedtime. 30 tablet 11  . ONETOUCH VERIO test strip USE AS INSTRUCTED TO CHECK THREE TIMES DAILY AND AS NEEDED. E11.8 300 strip 3  . pantoprazole (PROTONIX) 40 MG tablet TAKE 1 TABLET (40 MG TOTAL) BY MOUTH 2 (TWO) TIMES DAILY BEFORE A MEAL. 180 tablet 0  . Probiotic Product (PROBIOTIC DAILY PO) Take by mouth.    . simvastatin (ZOCOR) 20 MG tablet TAKE 1 TABLET (20 MG TOTAL) BY MOUTH EVERY EVENING. 90 tablet 3   No current facility-administered medications for this visit.       Review of Systems A complete review of systems was asked and was negative except for the following positive findings shortness of breath, diabetes, acid reflux, hemorrhoids, arthritis, glaucoma.  Blood pressure (!) 162/96, pulse 81, temperature (!) 97.5 F (36.4 C), temperature source Temporal, resp. rate 18, height _0  (1.803 m), weight 289 lb (131.1 kg), SpO2 99 %.  Physical  Exam CONSTITUTIONAL:  Pleasant, well-developed, well-nourished, and in no acute distress. EYES: Pupils equal and reactive to light, Sclera non-icteric EARS, NOSE, MOUTH AND THROAT:  The oropharynx was clear.  Dentition is good repair.  Oral mucosa pink and moist. LYMPH NODES:  Lymph nodes in the neck and axillae were normal RESPIRATORY:  Lungs were clear.  Normal  respiratory effort without pathologic use of accessory muscles of respiration CARDIOVASCULAR: Heart was regular without murmurs.  There were no carotid bruits. GI: The abdomen was soft, nontender, and nondistended. There were no palpable masses. There was no hepatosplenomegaly. There were normal bowel sounds in all quadrants. GU:  Rectal deferred.   MUSCULOSKELETAL:  Normal muscle strength and tone.  No clubbing or cyanosis.   SKIN:  There were no pathologic skin lesions.  There were no nodules on palpation. NEUROLOGIC:  Sensation is normal.  Cranial nerves are grossly intact. PSYCH:  Oriented to person, place and time.  Mood and affect are normal.  Data Reviewed CT scan and PET scan  I have personally reviewed the patient's imaging, laboratory findings and medical records.    Assessment    Anterior mediastinal mass    Plan    I have independently reviewed the patient's CT scan and PET scan.  The CT scan shows a calcified rim 2 cm rounded lesion.  PET scan is completely negative.  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  I explained all this to the patient I reviewed his x-rays with him.  I would suggest we repeat the scan in 6 months..  He is agreeable to doing so.       Nestor Lewandowsky, MD 12/15/2018, 9:56 AM

## 2018-12-15 NOTE — Telephone Encounter (Signed)
LOV 11/29/2018, future appointment 02/07/2019

## 2018-12-21 ENCOUNTER — Telehealth: Payer: Self-pay

## 2018-12-21 NOTE — Telephone Encounter (Signed)
Who did he get covid test through? I have not received any records.

## 2018-12-21 NOTE — Telephone Encounter (Signed)
Pt left v/m that covid test was mailed to pt and pt did the covid test and Dr Darnell Level was supposed to get results. Pt request cb. I did not see covid results under lab or media tab.Please advise.

## 2018-12-21 NOTE — Telephone Encounter (Signed)
Spoke with pt asking about test he received.  Says it came to him from [place] on 12/15/18.  He did the test and mailed it back out the same day.  Pt has since tossed any info that came with the test but remembers it had Dr. Synthia Innocent name in it and that's why he did it.   I informed the pt we do not do urine COVID tests.  Also, Dr. Darnell Level would have ordered a test, which he would tell the pt, and the pt would have been directed to a [facility] testing location.  Pt verbalizes understanding and, of course, is now concerned about what he has participated in but will try to find out.

## 2018-12-22 ENCOUNTER — Telehealth: Payer: Self-pay

## 2018-12-22 ENCOUNTER — Emergency Department: Payer: Medicare Other

## 2018-12-22 ENCOUNTER — Emergency Department
Admission: EM | Admit: 2018-12-22 | Discharge: 2018-12-22 | Disposition: A | Discharge status: Home / Self Care | Payer: Medicare Other | Attending: Emergency Medicine | Admitting: Emergency Medicine

## 2018-12-22 ENCOUNTER — Other Ambulatory Visit: Payer: Self-pay

## 2018-12-22 DIAGNOSIS — I5022 Chronic systolic (congestive) heart failure: Secondary | ICD-10-CM | POA: Diagnosis not present

## 2018-12-22 DIAGNOSIS — Z7984 Long term (current) use of oral hypoglycemic drugs: Secondary | ICD-10-CM | POA: Diagnosis not present

## 2018-12-22 DIAGNOSIS — I10 Essential (primary) hypertension: Secondary | ICD-10-CM | POA: Diagnosis not present

## 2018-12-22 DIAGNOSIS — I2510 Atherosclerotic heart disease of native coronary artery without angina pectoris: Secondary | ICD-10-CM | POA: Diagnosis not present

## 2018-12-22 DIAGNOSIS — Z87891 Personal history of nicotine dependence: Secondary | ICD-10-CM | POA: Diagnosis not present

## 2018-12-22 DIAGNOSIS — E119 Type 2 diabetes mellitus without complications: Secondary | ICD-10-CM | POA: Insufficient documentation

## 2018-12-22 DIAGNOSIS — Z7982 Long term (current) use of aspirin: Secondary | ICD-10-CM | POA: Insufficient documentation

## 2018-12-22 DIAGNOSIS — R1013 Epigastric pain: Secondary | ICD-10-CM | POA: Insufficient documentation

## 2018-12-22 DIAGNOSIS — J449 Chronic obstructive pulmonary disease, unspecified: Secondary | ICD-10-CM | POA: Diagnosis not present

## 2018-12-22 DIAGNOSIS — I110 Hypertensive heart disease with heart failure: Secondary | ICD-10-CM | POA: Diagnosis not present

## 2018-12-22 DIAGNOSIS — K760 Fatty (change of) liver, not elsewhere classified: Secondary | ICD-10-CM | POA: Diagnosis not present

## 2018-12-22 DIAGNOSIS — R109 Unspecified abdominal pain: Secondary | ICD-10-CM

## 2018-12-22 DIAGNOSIS — Z79899 Other long term (current) drug therapy: Secondary | ICD-10-CM | POA: Insufficient documentation

## 2018-12-22 LAB — COMPREHENSIVE METABOLIC PANEL
ALT: 19 U/L (ref 0–44)
AST: 21 U/L (ref 15–41)
Albumin: 4 g/dL (ref 3.5–5.0)
Alkaline Phosphatase: 63 U/L (ref 38–126)
Anion gap: 8 (ref 5–15)
BUN: 6 mg/dL — ABNORMAL LOW (ref 8–23)
CO2: 30 mmol/L (ref 22–32)
Calcium: 9.1 mg/dL (ref 8.9–10.3)
Chloride: 100 mmol/L (ref 98–111)
Creatinine, Ser: 0.64 mg/dL (ref 0.61–1.24)
GFR calc Af Amer: >60 mL/min (ref >60)
GFR calc non Af Amer: >60 mL/min (ref >60)
Glucose, Bld: 175 mg/dL — ABNORMAL HIGH (ref 70–99)
Potassium: 3.9 mmol/L (ref 3.5–5.1)
Sodium: 138 mmol/L (ref 135–145)
Total Bilirubin: 0.7 mg/dL (ref 0.3–1.2)
Total Protein: 7 g/dL (ref 6.5–8.1)

## 2018-12-22 LAB — LIPASE, BLOOD: Lipase: 23 U/L (ref 11–51)

## 2018-12-22 LAB — URINALYSIS, COMPLETE (UACMP) WITH MICROSCOPIC
Bacteria, UA: NONE SEEN
Bilirubin Urine: NEGATIVE
Glucose, UA: NEGATIVE mg/dL
Hgb urine dipstick: NEGATIVE
Ketones, ur: 5 mg/dL — AB
Leukocytes,Ua: NEGATIVE
Nitrite: NEGATIVE
Protein, ur: NEGATIVE mg/dL
Specific Gravity, Urine: 1.008 (ref 1.005–1.030)
pH: 8 (ref 5.0–8.0)

## 2018-12-22 LAB — CBC
HCT: 34.6 % — ABNORMAL LOW (ref 39.0–52.0)
Hemoglobin: 10 g/dL — ABNORMAL LOW (ref 13.0–17.0)
MCH: 22 pg — ABNORMAL LOW (ref 26.0–34.0)
MCHC: 28.9 g/dL — ABNORMAL LOW (ref 30.0–36.0)
MCV: 76 fL — ABNORMAL LOW (ref 80.0–100.0)
Platelets: 233 10*3/uL (ref 150–400)
RBC: 4.55 MIL/uL (ref 4.22–5.81)
RDW: 16.5 % — ABNORMAL HIGH (ref 11.5–15.5)
WBC: 8.2 10*3/uL (ref 4.0–10.5)
nRBC: 0 % (ref 0.0–0.2)

## 2018-12-22 LAB — TROPONIN I (HIGH SENSITIVITY): Troponin I (High Sensitivity): 6 ng/L (ref <18)

## 2018-12-22 MED ORDER — IOHEXOL 240 MG/ML SOLN
50.0000 mL | Freq: Once | INTRAMUSCULAR | Status: AC | PRN
  Administered 2018-12-22: 50 mL via ORAL

## 2018-12-22 MED ORDER — ALUM & MAG HYDROXIDE-SIMETH 200-200-20 MG/5ML PO SUSP
30.0000 mL | Freq: Once | ORAL | Status: AC
  Administered 2018-12-22: 30 mL via ORAL
  Filled 2018-12-22: qty 30

## 2018-12-22 MED ORDER — SODIUM CHLORIDE 0.9% FLUSH
3.0000 mL | Freq: Once | INTRAVENOUS | Status: AC
  Administered 2018-12-22: 16:00:00 3 mL via INTRAVENOUS

## 2018-12-22 MED ORDER — IOHEXOL 300 MG/ML  SOLN
100.0000 mL | Freq: Once | INTRAMUSCULAR | Status: AC | PRN
  Administered 2018-12-22: 15:00:00 100 mL via INTRAVENOUS

## 2018-12-22 NOTE — Telephone Encounter (Signed)
Noted. Thanks.

## 2018-12-22 NOTE — ED Notes (Signed)
Pt leaving for CT.  

## 2018-12-22 NOTE — ED Notes (Signed)
CT called and notified that pt is done drinking contrast.

## 2018-12-22 NOTE — Telephone Encounter (Signed)
Noted. Thanks. Longstanding h/o GI trouble, will await ER eval.

## 2018-12-22 NOTE — Telephone Encounter (Signed)
Pt said upper abd pain for 2 days; pain level now at least an 8; No N&V; feels like a bad,bad gas pain in chest and pt can't get his breath good. I was going to call 911 but pt said no he had someone to take him to [facility]. I asked if he was sure he had someone to take him and he said yes. I advised pt to go now to [facility] and pt voiced understanding. I advised if needed call 911. I called pt back to verify pt got someone to take him to [facility] and pt said yes someone is on the way to him now to take pt to [facility].  FYI to Dr Damita Dunnings.

## 2018-12-22 NOTE — ED Provider Notes (Addendum)
[facility] Emergency Department Provider Note  ____________________________________________   I have reviewed the triage vital signs and the nursing notes. Where available I have reviewed prior notes and, if possible and indicated, outside hospital notes.   Patient seen and evaluated during the coronavirus epidemic during a time with low staffing  Patient seen for the symptoms described in the history of present illness. She was evaluated in the context of the global COVID-19 pandemic, which necessitated consideration that the patient might be at risk for infection with the SARS-CoV-2 virus that causes COVID-19. Institutional protocols and algorithms that pertain to the evaluation of patients at risk for COVID-19 are in a state of rapid change based on information released by regulatory bodies including the CDC and federal and state organizations. These policies and algorithms were followed during the patient's care in the ED.    HISTORY  Chief Complaint Abdominal Pain    HPI [name] is a 63 y.o. male with a history of multiple medical problems including a BMI of 42  dyspnea COPD, cannabis abuse, what is documented as nonischemic cardiomyopathy, though his most recent echo last stress test that I can find was done in February 2019 chronic shortness of breath which showed normal EF, who has documented history of chronic epigastric abdominal pain going back for most of this year, there are notes in the chart from December 2016 comparing of gas dysphasia and his chronic shortness of breath and panic attacks associated with these symptoms as well, patient is on Gas-X, probiotics, antiacids and multiple other medications to control his gas sensations, he states he is compliant.  Patient not seen GI or had an endoscopy he states.  He states that his chronic recurrent gas pains are back.  He has epigastric abdominal discomfort which "turns into reflux" and then he has a  burning sensation in his throat.  Is worse when he lies flat.  No chest pain no shortness of breath over his perceived baseline, no orthopnea no leg swelling, no food associated symptoms no melena no bright red blood per rectum no hematemesis.  He states he has been having the symptoms again this time for the last 3 days.  Comes and goes.  Cannot predict how often will be here.  He describes his symptoms as a "gassiness".  Denies any exertional symptoms denies any nausea or vomiting denies any other complaint.  States that the same thing he has had for years he really wants to get to the bottom of it.      Past Medical History:  Diagnosis Date  . Abnormal drug screen 2015   MJ positive x3 - one more and we will stop prescribing ativan (08/2013).  . Angina   . Anxiety   . Arthritis   . CAD (coronary artery disease)    nonobstructive  . Cannabis abuse   . Chronic systolic CHF (congestive heart failure) ([place]) 2013   NYHA Class II/III  . COPD (chronic obstructive pulmonary disease) ([place])   . Diabetes type 2, uncontrolled ([place])    declines DSME  . Dilated cardiomyopathy ([place]) 2013   now improved  . Ex-smoker   . Frequent headaches   . History of atrial fibrillation 2013   chronic, s/p ablation prior on coumadin  . Hyperlipidemia   . Hypertension   . Migraine   . Nonischemic cardiomyopathy ([place]) 2013   EF 25% per Dr Nehemiah Massed  . Obesity   . OSA (obstructive sleep apnea)  does not use CPAP - unable to tolerate  . Seasonal allergies     Patient Active Problem List   Diagnosis Date Noted  . Personal history of tobacco use, presenting hazards to health 11/07/2018  . Epigastric abdominal pain 09/19/2018  . Chronic bronchitis ([place]) 07/25/2018  . Right sided facial pain (Secondary Area of Pain) 01/05/2018  . Chronic neck pain [facility] Area of Pain) (R>L) 01/05/2018  . Chronic upper extremity pain (Fourth Area of Pain) (R>L) 01/05/2018  . Chronic upper back pain 01/05/2018  .  Chronic bilateral low back pain without sciatica 01/05/2018  . Chronic pain syndrome 01/05/2018  . Right-sided headache 10/17/2017  . Right leg weakness 07/14/2017  . AAA (abdominal aortic aneurysm) without rupture ([place]) 09/15/2016  . Adrenal adenoma, left 08/13/2016  . Nodule of right lung 08/13/2016  . Aortic dilatation ([place]) 08/13/2016  . Fatty liver 08/01/2016  . Right flank pain 07/26/2016  . 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 Right shoulder pain 07/26/2016  . Restrictive lung disease 06/04/2016  . Chronic systolic CHF (congestive heart failure) ([place])   . Tinea pedis 06/30/2015  . Gassiness 02/21/2015  . Acute sinusitis 01/21/2015  . Advanced care planning/counseling discussion 10/16/2014  . Health maintenance examination 10/16/2014  . Type II diabetes mellitus ([place]) 10/25/2013  . Medicare annual wellness visit, subsequent 09/03/2013  . Umbilical hernia without obstruction and without gangrene 09/03/2013  . Left flank mass 09/03/2013  . Mixed hyperlipidemia 06/29/2013  . Coronary artery disease 06/29/2013  . Abnormal toxicological findings 06/24/2013  . Exertional dyspnea 06/14/2013  . Malaise and fatigue 03/14/2013  . Chest pain 02/07/2013  . Hypertension   . Panic attacks   . Atrial fibrillation and flutter ([place]) 07/07/2011  . Severe obesity (BMI 35.0-39.9) with comorbidity ([place]) 07/07/2011  . Ex-smoker 08/13/2009  . Sleep apnea, central 08/13/2009  . DDD (degenerative disc disease), cervical 08/13/2009  . Allergic rhinitis 06/24/2009    Past Surgical History:  Procedure Laterality Date  . ATRIAL FLUTTER ABLATION N/A 09/21/2011   Procedure: ATRIAL FLUTTER ABLATION;  Surgeon: Thompson Grayer, MD;  Location: [facility] CATH LAB;  Service: Cardiovascular;  Laterality: N/A;  . CARDIAC ELECTROPHYSIOLOGY [place] AND ABLATION  2013   for atrial flutter  . CARDIOVASCULAR STRESS TEST  03/2012   ETT WNL Nehemiah Massed)  . US ECHOCARDIOGRAPHY  2014   EF 50%, nl LV fxn, RV nl  size/function, mild mitral insuff    Prior to Admission medications   Medication Sig Start Date End Date Taking? Authorizing Provider  ACCU-CHEK FASTCLIX LANCETS MISC Check blood sugar once daily and as instructed. Dx 250.00 05/10/13   [name], MD  albuterol (VENTOLIN HFA) 108 (90 Base) MCG/ACT inhaler Inhale 2 puffs into the lungs every 6 (six) hours as needed for wheezing or shortness of breath. 11/29/18   [name], MD  aspirin EC 81 MG tablet Take 81 mg by mouth daily.    [provider]  Blood Glucose Monitoring Suppl (BLOOD GLUCOSE MONITOR SYSTEM) W/DEVICE KIT by Does not apply route. Use to check sugar once daily and as needed Dx: E11.9 **ONE TOUCH VERIO**    [provider]  carvedilol (COREG) 6.25 MG tablet TAKE 1 TABLET BY MOUTH 2 TIMES DAILY WITH A MEAL. 12/13/18   [name], MD  citalopram (CELEXA) 10 MG tablet TAKE 1 TABLET BY MOUTH EVERY DAY 09/12/18   [name], MD  fluticasone [facility]) 50 MCG/ACT nasal spray SPRAY 2 SPRAYS INTO EACH NOSTRIL EVERY DAY 11/09/17  [name], MD  furosemide (LASIX) 20 MG tablet TAKE 1 TABLET (20 MG TOTAL) BY MOUTH 2 (TWO) TIMES DAILY AS NEEDED. 12/15/18   [name], MD  glimepiride (AMARYL) 2 MG tablet TAKE 1 TABLET (2 MG TOTAL) BY MOUTH DAILY WITH BREAKFAST. 08/18/18   [name], MD  hydrOXYzine (ATARAX/VISTARIL) 50 MG tablet Take 1 tablet (50 mg total) by mouth 2 (two) times daily as needed for anxiety. 11/29/18   [name], MD  metFORMIN (GLUCOPHAGE) 1000 MG tablet TAKE 1 TABLET (1,000 MG TOTAL) BY MOUTH 2 (TWO) TIMES DAILY WITH A MEAL. 07/18/18   [name], MD  montelukast (SINGULAIR) 10 MG tablet Take 1 tablet (10 mg total) by mouth at bedtime. 06/16/18   [name], MD  [facility] VERIO test strip USE AS INSTRUCTED TO CHECK THREE TIMES DAILY AND AS NEEDED. E11.8 11/30/18   [name], MD  pantoprazole (PROTONIX) 40 MG tablet TAKE 1 TABLET (40 MG TOTAL) BY  MOUTH 2 (TWO) TIMES DAILY BEFORE A MEAL. 11/21/18   [name], MD  Probiotic Product (PROBIOTIC DAILY PO) Take by mouth.    [provider]  simvastatin (ZOCOR) 20 MG tablet TAKE 1 TABLET (20 MG TOTAL) BY MOUTH EVERY EVENING. 12/09/17   [name], MD    Allergies Atorvastatin and Lisinopril  Family History  Problem Relation Age of Onset  . Heart failure Father 73  . Cancer Mother 9       NHL  . Hypertension Maternal Grandfather   . CAD Maternal Grandfather 42       MI  . Diabetes Other        grandparents  . Cancer Brother 70       lung (smoker)    Social History Social History   Tobacco Use  . Smoking status: Former Smoker    Packs/day: 1.00    Years: 31.00    Pack years: 31.00    Types: Cigarettes    Quit date: 04/26/2018    Years since quitting: 0.6  . Smokeless tobacco: Never Used  Substance Use Topics  . Alcohol use: No    Alcohol/week: 0.0 standard drinks  . Drug use: Yes    Types: Marijuana    Review of Systems Constitutional: No fever/chills Eyes: No visual changes. ENT: No sore throat. No stiff neck no neck pain Cardiovascular: Denies chest pain. Respiratory: Denies shortness of breath. Gastrointestinal:   no vomiting.  No diarrhea.  No constipation. Genitourinary: Negative for dysuria. Musculoskeletal: Negative lower extremity swelling Skin: Negative for rash. Neurological: Negative for severe headaches, focal weakness or numbness.   ____________________________________________   PHYSICAL EXAM:  VITAL SIGNS: ED Triage Vitals  Enc Vitals Group     BP 12/22/18 0919 (!) 167/82     Pulse --      Resp 12/22/18 0919 18     Temp 12/22/18 0919 98.1 F (36.7 C)     Temp Source 12/22/18 0919 Oral     SpO2 12/22/18 0919 93 %     Weight 12/22/18 0920 278 lb (126.1 kg)     Height 12/22/18 0920 '5\' 11"'  (1.803 m)     Head Circumference --      Peak Flow --      Pain Score 12/22/18 0920 7     Pain Loc --      Pain Edu? --       Excl. in [place]? --     Constitutional: Alert and oriented. Well appearing and in no acute distress.  Eyes: Conjunctivae are normal Head: Atraumatic HEENT: No congestion/rhinnorhea. Mucous membranes are moist.  Oropharynx non-erythematous Neck:   Nontender with no meningismus, no masses, no stridor Cardiovascular: Normal rate, regular rhythm. Grossly normal heart sounds.  Good peripheral circulation. Respiratory: Normal respiratory effort.  No retractions. Lungs CTAB. Abdominal: Soft and nontender. No distention. No guarding no rebound significant BMI noted Back:  There is no focal tenderness or step off.  there is no midline tenderness there are no lesions noted. there is no CVA tenderness  Musculoskeletal: No lower extremity tenderness, no upper extremity tenderness. No joint effusions, no DVT signs strong distal pulses no edema Neurologic:  Normal speech and language. No gross focal neurologic deficits are appreciated.  Skin:  Skin is warm, dry and intact. No rash noted. Psychiatric: Mood and affect are normal. Speech and behavior are normal.  ____________________________________________   LABS (all labs ordered are listed, but only abnormal results are displayed)  Labs Reviewed  COMPREHENSIVE METABOLIC PANEL - Abnormal; Notable for the following components:      Result Value   Glucose, Bld 175 (*)    BUN 6 (*)    All other components within normal limits  CBC - Abnormal; Notable for the following components:   Hemoglobin 10.0 (*)    HCT 34.6 (*)    MCV 76.0 (*)    MCH 22.0 (*)    MCHC 28.9 (*)    RDW 16.5 (*)    All other components within normal limits  LIPASE, BLOOD  URINALYSIS, COMPLETE (UACMP) WITH MICROSCOPIC  TROPONIN I (HIGH SENSITIVITY)    Pertinent labs  results that were available during my care of the patient were reviewed by me and considered in my medical decision making (see chart for details). ____________________________________________  EKG  I personally  interpreted any EKGs ordered by me or triage  _________ sinus 71 normal axis no acute ST elevation or depression normal unremarkable EKG ___________________________________  RADIOLOGY  Pertinent labs & imaging results that were available during my care of the patient were reviewed by me and considered in my medical decision making (see chart for details). If possible, patient and/or family made aware of any abnormal findings.  No results found. ____________________________________________    PROCEDURES  Procedure(s) performed: None  Procedures  Critical Care performed: None  ____________________________________________   INITIAL IMPRESSION / ASSESSMENT AND PLAN / ED COURSE  Pertinent labs & imaging results that were available during my care of the patient were reviewed by me and considered in my medical decision making (see chart for details).   Patient here with gassy discomfort in his stomach abdomen is nonsurgical looking back at prior CT scan in 2018 patient did have a mild dilatation of his aorta, do not suspect this is likely the cause of this, and I do feel the patient likely is safe for outpatient follow-up on his chronic abdominal pain given normal vitals and blood work etc. however, I will send cardiac enzymes and given the history of a small dilatation of the aorta with no recent follow-up we will obtain recurrent imaging and reassess     ----------------------------------------- 2:40 PM on 12/22/2018 -----------------------------------------  States he feels much better after GI cocktail which while not diagnostic of anything certainly is supportive of the likely pathology that we detect on history and physical.  Patient's abdomen remains benign.  Again I will do a CT scan because I noted 2 years ago he had a dilatation of his aorta which required outpatient imaging which  was never done at least not done to the extent that I can determine.  I do not think this is likely  from a AAA fortunately.  Does have good distal pulses itself.  Patient would like to go home as soon as that is done and I feel that is not unreasonable again, patient's been having gassy sensations associated with reflux disease for the last 4 years at least.  And he has a very reassuring work-up nothing at this time to suggest dissection ACS PE myocarditis endocarditis pneumonia pneumothorax and low suspicion for acute intra-abdominal pathology such as cholecystitis ischemic gut etc. ____________________________________________   FINAL CLINICAL IMPRESSION(S) / ED DIAGNOSES  Final diagnoses:  None      This chart was dictated using voice recognition software.  Despite best efforts to proofread,  errors can occur which can change meaning.      Schuyler Amor, MD 12/22/18 1325    Schuyler Amor, MD 12/22/18 [phone]

## 2018-12-22 NOTE — Discharge Instructions (Addendum)
You will need to have an ultrasound of your abdomen in 3 years to reevaluate your aorta which does not look changed from the way it was 2 years ago fortunately.  As far as your abdominal pain that you have been having since 2016, we advise you follow closely up with GI, gastroenterologist, in the next few days.  Continue taking your antiacid's.  If you have chest pain shortness of breath increased pain pain that is different from your normal pain or other concerns including bleeding or vomiting or diarrhea return to the emergency department.

## 2018-12-22 NOTE — ED Triage Notes (Signed)
Pt c/o generalized abd cramping with nausea and right flank pain for the past 2 days. Denies vomiting or diarrhea.

## 2018-12-23 NOTE — Telephone Encounter (Signed)
plz call Monday for f/u after ER visit, and to schedule in office visit.

## 2018-12-25 NOTE — Telephone Encounter (Signed)
Spoke with pt after ER visit.  Says he is feeling better.  Says whatever he was given to drink for the scan helped with the gas pain.  Says they did not find anything.   Pt scheduled for ER f/u 01/02/19 at 8:30. Fyi to Dr. Darnell Level.

## 2019-01-02 ENCOUNTER — Ambulatory Visit (INDEPENDENT_AMBULATORY_CARE_PROVIDER_SITE_OTHER): Payer: Medicare Other | Admitting: Family Medicine

## 2019-01-02 ENCOUNTER — Encounter: Payer: Self-pay | Admitting: Family Medicine

## 2019-01-02 ENCOUNTER — Other Ambulatory Visit: Payer: Self-pay

## 2019-01-02 VITALS — BP 132/76 | HR 82 | Temp 97.6°F | Ht 70.0 in | Wt 286.4 lb

## 2019-01-02 DIAGNOSIS — R1013 Epigastric pain: Secondary | ICD-10-CM | POA: Diagnosis not present

## 2019-01-02 DIAGNOSIS — D509 Iron deficiency anemia, unspecified: Secondary | ICD-10-CM | POA: Insufficient documentation

## 2019-01-02 DIAGNOSIS — R140 Abdominal distension (gaseous): Secondary | ICD-10-CM | POA: Diagnosis not present

## 2019-01-02 DIAGNOSIS — Z23 Encounter for immunization: Secondary | ICD-10-CM

## 2019-01-02 DIAGNOSIS — E1169 Type 2 diabetes mellitus with other specified complication: Secondary | ICD-10-CM | POA: Diagnosis not present

## 2019-01-02 DIAGNOSIS — I714 Abdominal aortic aneurysm, without rupture, unspecified: Secondary | ICD-10-CM

## 2019-01-02 DIAGNOSIS — G4731 Primary central sleep apnea: Secondary | ICD-10-CM

## 2019-01-02 DIAGNOSIS — D649 Anemia, unspecified: Secondary | ICD-10-CM

## 2019-01-02 DIAGNOSIS — I5022 Chronic systolic (congestive) heart failure: Secondary | ICD-10-CM

## 2019-01-02 LAB — POCT GLYCOSYLATED HEMOGLOBIN (HGB A1C): Hemoglobin A1C: 7.3 % — AB (ref 4.0–5.6)

## 2019-01-02 NOTE — Assessment & Plan Note (Signed)
Rechecked in hospital - stable, rec rpt [place] 3 yrs

## 2019-01-02 NOTE — Progress Notes (Addendum)
This visit was conducted in person.  BP 132/76 (BP Location: Left Arm, Patient Position: Sitting, Cuff Size: Large)   Pulse 82   Temp 97.6 F (36.4 C) (Temporal)   Ht '5\' 10"'  (1.778 m)   Wt 286 lb 7 oz (129.9 kg)   SpO2 94%   BMI 41.10 kg/m    CC: ER f/u visit Subjective:    Patient ID: [name], male    DOB: 07-25-1955, 63 y.o.   MRN: [id number]  HPI: [name] is a 63 y.o. male presenting on 01/02/2019 for Hospitalization Follow-up (Seen at [facility] ED on 12/22/18.)   Recent ER visit for recurrent epigastric pain for 4 days treated with GI cocktail with benefit - he also feels oral contrast helped significantly. CT abd/pelvis with contrast showed unchanged 3.3cm AAA, rec f/u 3 yrs with [place]. Longstanding h/o gassiness, bloating, dull epigastric abd discomfort and dyspnea from bloating. This may have started after regularly using CPAP machine. Ongoing pantoprazole 78m bid use for last few months. Continues probiotic. Does not relate any food to worsening symptoms.   No fevers/chills, dysphagia, significant early satiety, nausea or vomiting.  Normal stools, occasional diarrhea. Appetite ok. Denies blood in stool.   Recently incidentally found mediastinal mass s/p thoracic surgery eval as well as reassuring PET scan thought benign cyst, planned rpt CT in 6 months.   Has not seen GI in the past.  New worsening anemia over last few months. Denies blood in stool or urine. UA in hospital was normal.      Relevant past medical, surgical, family and social history reviewed and updated as indicated. Interim medical history since our last visit reviewed. Allergies and medications reviewed and updated. Outpatient Medications Prior to Visit  Medication Sig Dispense Refill  . ACCU-CHEK FASTCLIX LANCETS MISC Check blood sugar once daily and as instructed. Dx 250.00 100 each 3  . albuterol (VENTOLIN HFA) 108 (90 Base) MCG/ACT inhaler Inhale 2 puffs into the lungs every 6 (six) hours  as needed for wheezing or shortness of breath. 18 g 1  . aspirin EC 81 MG tablet Take 81 mg by mouth daily.    . Blood Glucose Monitoring Suppl (BLOOD GLUCOSE MONITOR SYSTEM) W/DEVICE KIT by Does not apply route. 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 carvedilol (COREG) 6.25 MG tablet TAKE 1 TABLET BY MOUTH 2 TIMES DAILY WITH A MEAL. 180 tablet 0  . citalopram (CELEXA) 10 MG tablet TAKE 1 TABLET BY MOUTH EVERY DAY 90 tablet 1  . fluticasone (FLONASE) 50 MCG/ACT nasal spray SPRAY 2 SPRAYS INTO EACH NOSTRIL EVERY DAY 16 g 3  . furosemide (LASIX) 20 MG tablet TAKE 1 TABLET (20 MG TOTAL) BY MOUTH 2 (TWO) TIMES DAILY AS NEEDED. 180 tablet 0  . glimepiride (AMARYL) 2 MG tablet TAKE 1 TABLET (2 MG TOTAL) BY MOUTH DAILY WITH BREAKFAST. 90 tablet 1  . hydrOXYzine (ATARAX/VISTARIL) 50 MG tablet Take 1 tablet (50 mg total) by mouth 2 (two) times daily as needed for anxiety. 180 tablet 1  . metFORMIN (GLUCOPHAGE) 1000 MG tablet TAKE 1 TABLET (1,000 MG TOTAL) BY MOUTH 2 (TWO) TIMES DAILY WITH A MEAL. 180 tablet 1  . montelukast (SINGULAIR) 10 MG tablet Take 1 tablet (10 mg total) by mouth at bedtime. 30 tablet 11  . ONETOUCH VERIO test strip USE AS INSTRUCTED TO CHECK THREE TIMES DAILY AND AS NEEDED. E11.8 300 strip 3  .  pantoprazole (PROTONIX) 40 MG tablet TAKE 1 TABLET (40 MG TOTAL) BY MOUTH 2 (TWO) TIMES DAILY BEFORE A MEAL. 180 tablet 0  . Probiotic Product (PROBIOTIC DAILY PO) Take by mouth.    . simvastatin (ZOCOR) 20 MG tablet TAKE 1 TABLET (20 MG TOTAL) BY MOUTH EVERY EVENING. 90 tablet 3   No facility-administered medications prior to visit.      Per HPI unless specifically indicated in ROS section below Review of Systems Objective:    BP 132/76 (BP Location: Left Arm, Patient Position: Sitting, Cuff Size: Large)   Pulse 82   Temp 97.6 F (36.4 C) (Temporal)   Ht '5\' 10"'  (1.778 m)   Wt 286 lb 7 oz (129.9 kg)   SpO2 94%   BMI 41.10 kg/m   Wt Readings  from Last 3 Encounters:  01/02/19 286 lb 7 oz (129.9 kg)  12/22/18 278 lb (126.1 kg)  12/15/18 289 lb (131.1 kg)    Physical Exam Vitals signs and nursing note reviewed.  Constitutional:      General: He is not in acute distress.    Appearance: Normal appearance. He is not ill-appearing.  HENT:     Mouth/Throat:     Mouth: Mucous membranes are moist.     Pharynx: No posterior oropharyngeal erythema.  Cardiovascular:     Rate and Rhythm: Normal rate and regular rhythm.     Pulses: Normal pulses.     Heart sounds: Normal heart sounds. No murmur.  Pulmonary:     Effort: Pulmonary effort is normal. No respiratory distress.     Breath sounds: Wheezing (mild apical wheezing, clears with deep breathing) present. No rhonchi or rales.  Abdominal:     General: Abdomen is flat and protuberant. Bowel sounds are increased. There is distension.     Palpations: Abdomen is soft. There is no fluid wave or mass.     Tenderness: There is abdominal tenderness (mild) in the right lower quadrant. There is no guarding or rebound. Negative signs include Murphy's sign.     Hernia: A hernia is present. Hernia is present in the umbilical area.  Musculoskeletal:     Right lower leg: Edema (1+ nonpitting) present.     Left lower leg: Edema (1+ nonpitting) present.  Neurological:     Mental Status: He is alert.  Psychiatric:        Mood and Affect: Mood normal.        Behavior: Behavior normal.       Results for orders placed or performed in visit on 01/02/19  POCT glycosylated hemoglobin (Hb A1C)  Result Value Ref Range   Hemoglobin A1C 7.3 (A) 4.0 - 5.6 %   HbA1c POC (<> result, manual entry)     HbA1c, POC (prediabetic range)     HbA1c, POC (controlled diabetic range)     Lab Results  Component Value Date   CREATININE 0.64 12/22/2018   BUN 6 (L) 12/22/2018   NA 138 12/22/2018   K 3.9 12/22/2018   CL 100 12/22/2018   CO2 30 12/22/2018    Assessment & Plan:   Problem List Items Addressed  This Visit    Type II diabetes mellitus ([place])    Update A1c. Discussed possible addition of SLGT2 in setting of CHF. Reviewed side effects to monitor for including recurrent yeast infection, UTI or groin infection.       Relevant Orders   POCT glycosylated hemoglobin (Hb A1C) (Completed)   Sleep apnea, central   Severe  obesity (BMI 35.0-39.9) with comorbidity ([place])   Gassiness   Relevant Orders   Ambulatory referral to Gastroenterology   Epigastric abdominal pain - Primary    Chronic, intermittent episodes of severe flares despite daily ppi bid and probiotic. Does not seem to be food related. Discussed SIBO - I will see if I can order carb breath test.   ADDENDUM ==> after researching, I do not have ability to order this test at this time, will refer to GI for their input input.       Relevant Orders   Ambulatory referral to Gastroenterology   Chronic systolic CHF (congestive heart failure) ([place])    Would benefit from SGLT2 - discussed addition of this to antihyperglycemic regimen.       Anemia    New, worsening noted at ER. Has not completed colon cancer screening. Will refer to GI for this.       Relevant Orders   Ambulatory referral to Gastroenterology   AAA (abdominal aortic aneurysm) without rupture ([place])    Rechecked in hospital - stable, rec rpt [place] 3 yrs       Other Visit Diagnoses    Need for influenza vaccination       Relevant Orders   Flu Vaccine QUAD 36+ mos IM (Completed)       No orders of the defined types were placed in this encounter.  Orders Placed This Encounter  Procedures  . Flu Vaccine QUAD 36+ mos IM  . Ambulatory referral to Gastroenterology    Referral Priority:   Routine    Referral Type:   Consultation    Referral Reason:   Specialty Services Required    Number of Visits Requested:   1  . POCT glycosylated hemoglobin (Hb A1C)    Patient Instructions  Flu shot today A1c today.  We will refer you to GI to discuss colonoscopy/colon  cancer screening.  I will see if I can order carb breath test to evaluate for SIBO.  We may consider medicine like jardiance or invokana for better diabetes control.  Return in 3 months for follow up visit.    Follow up plan: Return if symptoms worsen or fail to improve.  [name], MD

## 2019-01-02 NOTE — Assessment & Plan Note (Addendum)
Chronic, intermittent episodes of severe flares despite daily ppi bid and probiotic. Does not seem to be food related. Discussed SIBO - I will see if I can order carb breath test.   ADDENDUM ==> after researching, I do not have ability to order this test at this time, will refer to GI for their input input.

## 2019-01-02 NOTE — Assessment & Plan Note (Addendum)
New, worsening noted at ER. Has not completed colon cancer screening. Will refer to GI for this.

## 2019-01-02 NOTE — Assessment & Plan Note (Addendum)
Update A1c. Discussed possible addition of SLGT2 in setting of CHF. Reviewed side effects to monitor for including recurrent yeast infection, UTI or groin infection.

## 2019-01-02 NOTE — Patient Instructions (Addendum)
Flu shot today A1c today.  We will refer you to GI to discuss colonoscopy/colon cancer screening.  I will see if I can order carb breath test to evaluate for SIBO.  We may consider medicine like jardiance or invokana for better diabetes control.  Return in 3 months for follow up visit.

## 2019-01-02 NOTE — Assessment & Plan Note (Signed)
Would benefit from SGLT2 - discussed addition of this to antihyperglycemic regimen.

## 2019-01-03 ENCOUNTER — Other Ambulatory Visit: Payer: Self-pay | Admitting: Family Medicine

## 2019-01-03 MED ORDER — CANAGLIFLOZIN 100 MG PO TABS: 100.0000 mg | ORAL_TABLET | Freq: Every day | ORAL | 3 refills | Status: DC

## 2019-01-03 NOTE — Addendum Note (Signed)
Addended by: [name] on: 01/03/2019 10:06 AM   Modules accepted: Orders

## 2019-01-21 ENCOUNTER — Other Ambulatory Visit: Payer: Self-pay | Admitting: Family Medicine

## 2019-01-31 ENCOUNTER — Other Ambulatory Visit: Payer: Medicare Other

## 2019-01-31 ENCOUNTER — Other Ambulatory Visit: Payer: Self-pay | Admitting: Family Medicine

## 2019-01-31 DIAGNOSIS — Z125 Encounter for screening for malignant neoplasm of prostate: Secondary | ICD-10-CM

## 2019-01-31 DIAGNOSIS — E559 Vitamin D deficiency, unspecified: Secondary | ICD-10-CM

## 2019-01-31 DIAGNOSIS — E1169 Type 2 diabetes mellitus with other specified complication: Secondary | ICD-10-CM

## 2019-01-31 DIAGNOSIS — D649 Anemia, unspecified: Secondary | ICD-10-CM

## 2019-01-31 DIAGNOSIS — E785 Hyperlipidemia, unspecified: Secondary | ICD-10-CM

## 2019-02-02 ENCOUNTER — Ambulatory Visit (INDEPENDENT_AMBULATORY_CARE_PROVIDER_SITE_OTHER): Payer: Medicare Other | Admitting: Family Medicine

## 2019-02-02 ENCOUNTER — Other Ambulatory Visit: Payer: Self-pay

## 2019-02-02 ENCOUNTER — Encounter: Payer: Self-pay | Admitting: Family Medicine

## 2019-02-02 VITALS — BP 132/70 | HR 78 | Temp 97.9°F | Wt 285.0 lb

## 2019-02-02 DIAGNOSIS — E785 Hyperlipidemia, unspecified: Secondary | ICD-10-CM

## 2019-02-02 DIAGNOSIS — I5023 Acute on chronic systolic (congestive) heart failure: Secondary | ICD-10-CM

## 2019-02-02 DIAGNOSIS — R0600 Dyspnea, unspecified: Secondary | ICD-10-CM

## 2019-02-02 DIAGNOSIS — E1169 Type 2 diabetes mellitus with other specified complication: Secondary | ICD-10-CM | POA: Diagnosis not present

## 2019-02-02 DIAGNOSIS — D649 Anemia, unspecified: Secondary | ICD-10-CM

## 2019-02-02 DIAGNOSIS — E118 Type 2 diabetes mellitus with unspecified complications: Secondary | ICD-10-CM | POA: Diagnosis not present

## 2019-02-02 DIAGNOSIS — I509 Heart failure, unspecified: Secondary | ICD-10-CM | POA: Insufficient documentation

## 2019-02-02 DIAGNOSIS — E559 Vitamin D deficiency, unspecified: Secondary | ICD-10-CM | POA: Diagnosis not present

## 2019-02-02 DIAGNOSIS — I5022 Chronic systolic (congestive) heart failure: Secondary | ICD-10-CM

## 2019-02-02 DIAGNOSIS — Z125 Encounter for screening for malignant neoplasm of prostate: Secondary | ICD-10-CM

## 2019-02-02 DIAGNOSIS — R5381 Other malaise: Secondary | ICD-10-CM

## 2019-02-02 DIAGNOSIS — E1165 Type 2 diabetes mellitus with hyperglycemia: Secondary | ICD-10-CM

## 2019-02-02 DIAGNOSIS — R5383 Other fatigue: Secondary | ICD-10-CM

## 2019-02-02 DIAGNOSIS — IMO0002 Reserved for concepts with insufficient information to code with codable children: Secondary | ICD-10-CM

## 2019-02-02 DIAGNOSIS — R0609 Other forms of dyspnea: Secondary | ICD-10-CM

## 2019-02-02 LAB — CBC WITH DIFFERENTIAL/PLATELET
Basophils Absolute: 0.1 10*3/uL (ref 0.0–0.1)
Basophils Relative: 0.8 % (ref 0.0–3.0)
Eosinophils Absolute: 0.2 10*3/uL (ref 0.0–0.7)
Eosinophils Relative: 2.6 % (ref 0.0–5.0)
HCT: 33 % — ABNORMAL LOW (ref 39.0–52.0)
Hemoglobin: 10 g/dL — ABNORMAL LOW (ref 13.0–17.0)
Lymphocytes Relative: 27.4 % (ref 12.0–46.0)
Lymphs Abs: 2.5 10*3/uL (ref 0.7–4.0)
MCHC: 30.4 g/dL (ref 30.0–36.0)
MCV: 70.6 fl — ABNORMAL LOW (ref 78.0–100.0)
Monocytes Absolute: 0.9 10*3/uL (ref 0.1–1.0)
Monocytes Relative: 9.9 % (ref 3.0–12.0)
Neutro Abs: 5.5 10*3/uL (ref 1.4–7.7)
Neutrophils Relative %: 59.3 % (ref 43.0–77.0)
Platelets: 294 10*3/uL (ref 150.0–400.0)
RBC: 4.67 Mil/uL (ref 4.22–5.81)
RDW: 17 % — ABNORMAL HIGH (ref 11.5–15.5)
WBC: 9.2 10*3/uL (ref 4.0–10.5)

## 2019-02-02 LAB — IBC PANEL
Iron: 11 ug/dL — ABNORMAL LOW (ref 42–165)
Saturation Ratios: 2.2 % — ABNORMAL LOW (ref 20.0–50.0)
Transferrin: 356 mg/dL (ref 212.0–360.0)

## 2019-02-02 LAB — LIPID PANEL
Cholesterol: 154 mg/dL (ref 0–200)
HDL: 39 mg/dL — ABNORMAL LOW (ref >39.00)
NonHDL: 114.54
Total CHOL/HDL Ratio: 4
Triglycerides: 344 mg/dL — ABNORMAL HIGH (ref 0.0–149.0)
VLDL: 68.8 mg/dL — ABNORMAL HIGH (ref 0.0–40.0)

## 2019-02-02 LAB — COMPREHENSIVE METABOLIC PANEL
ALT: 23 U/L (ref 0–53)
AST: 25 U/L (ref 0–37)
Albumin: 4.1 g/dL (ref 3.5–5.2)
Alkaline Phosphatase: 60 U/L (ref 39–117)
BUN: 9 mg/dL (ref 6–23)
CO2: 33 mEq/L — ABNORMAL HIGH (ref 19–32)
Calcium: 9.6 mg/dL (ref 8.4–10.5)
Chloride: 100 mEq/L (ref 96–112)
Creatinine, Ser: 0.65 mg/dL (ref 0.40–1.50)
GFR: 123.92 mL/min (ref >60.00)
Glucose, Bld: 176 mg/dL — ABNORMAL HIGH (ref 70–99)
Potassium: 5 mEq/L (ref 3.5–5.1)
Sodium: 140 mEq/L (ref 135–145)
Total Bilirubin: 0.3 mg/dL (ref 0.2–1.2)
Total Protein: 6.5 g/dL (ref 6.0–8.3)

## 2019-02-02 LAB — FERRITIN: Ferritin: 6.3 ng/mL — ABNORMAL LOW (ref 22.0–322.0)

## 2019-02-02 LAB — FOLATE: Folate: 9.6 ng/mL (ref >5.9)

## 2019-02-02 LAB — VITAMIN D 25 HYDROXY (VIT D DEFICIENCY, FRACTURES): VITD: 13.94 ng/mL — ABNORMAL LOW (ref 30.00–100.00)

## 2019-02-02 LAB — VITAMIN B12: Vitamin B-12: 231 pg/mL (ref 211–911)

## 2019-02-02 LAB — BRAIN NATRIURETIC PEPTIDE: Pro B Natriuretic peptide (BNP): 29 pg/mL (ref 0.0–100.0)

## 2019-02-02 LAB — LDL CHOLESTEROL, DIRECT: Direct LDL: 86 mg/dL

## 2019-02-02 LAB — PSA: PSA: 0.57 ng/mL (ref 0.10–4.00)

## 2019-02-02 NOTE — Patient Instructions (Signed)
I think you have CHF flare - treat by doubling lasix dose for the next 3 days.  Call us on Monday with an update.  Price out canagliflozin for diabetes.  Labs today.

## 2019-02-02 NOTE — Assessment & Plan Note (Addendum)
Will price out SGLT2 in setting of CHF. Discussed monitoring for recurrent UTI, yeast infection, groin infection. Continue other meds. He has noted increase cbg's recently.

## 2019-02-02 NOTE — Assessment & Plan Note (Signed)
Story/exam consistent with acute CHF exacerbation - will double lasix x3 days and he will call us Monday with an update. Pt agrees with plan.

## 2019-02-02 NOTE — Assessment & Plan Note (Signed)
See above

## 2019-02-02 NOTE — Progress Notes (Signed)
This visit was conducted in person.  BP 132/70   Pulse 78   Temp 97.9 F (36.6 C) (Temporal)   Wt 285 lb (129.3 kg)   SpO2 97%   BMI 40.89 kg/m    CC: f/u visit Subjective:    Patient ID: [name], male    DOB: 1956/01/17, 63 y.o.   MRN: [id number]  HPI: [name] is a 63 y.o. male presenting on 02/02/2019 for Follow-up and Shortness of Breath (less energy...)   Noticing worsening trouble with shortness of breath and fatigue over the last 1+ week. Increased leg swelling also noted, but that is some better. Weight unchanged from last visit. Worsening orthopnea - sleeping on couch over the past week. Mild cough and wheezing. He regularly takes lasix 61m bid.   He canceled physical scheduled for next week as he wasn't feeling well.   Has still not seen GI - appt scheduled for next month. Requesting carb breath test to eval for SIBO.   DM - does regularly check sugars BID - 200s random. Compliant with antihyperglycemic regimen which includes: amaryl 236mdaily, metformin 100070mid. Did not yet start invokana. Denies low sugars or hypoglycemic symptoms. Denies paresthesias. Last diabetic eye exam DUE. Pneumovax: 2014. Prevnar: not due. Glucometer brand: accucheck. DSME: previously declined. Lab Results  Component Value Date   HGBA1C 7.3 (A) 01/02/2019   Diabetic Foot Exam - Simple   No data filed     Lab Results  Component Value Date   MICROALBUR 1.0 10/17/2017        Relevant past medical, surgical, family and social history reviewed and updated as indicated. Interim medical history since our last visit reviewed. Allergies and medications reviewed and updated. Outpatient Medications Prior to Visit  Medication Sig Dispense Refill  . ACCU-CHEK FASTCLIX LANCETS MISC Check blood sugar once daily and as instructed. Dx 250.00 100 each 3  . albuterol (VENTOLIN HFA) 108 (90 Base) MCG/ACT inhaler Inhale 2 puffs into the lungs every 6 (six) hours as needed for  wheezing or shortness of breath. 18 g 1  . aspirin EC 81 MG tablet Take 81 mg by mouth daily.    . Blood Glucose Monitoring Suppl (BLOOD GLUCOSE MONITOR SYSTEM) W/DEVICE KIT by Does not apply route. 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 carvedilol (COREG) 6.25 MG tablet TAKE 1 TABLET BY MOUTH 2 TIMES DAILY WITH A MEAL. 180 tablet 0  . citalopram (CELEXA) 10 MG tablet TAKE 1 TABLET BY MOUTH EVERY DAY 90 tablet 1  . fluticasone (FLONASE) 50 MCG/ACT nasal spray SPRAY 2 SPRAYS INTO EACH NOSTRIL EVERY DAY 16 g 3  . furosemide (LASIX) 20 MG tablet TAKE 1 TABLET (20 MG TOTAL) BY MOUTH 2 (TWO) TIMES DAILY AS NEEDED. 180 tablet 0  . glimepiride (AMARYL) 2 MG tablet TAKE 1 TABLET (2 MG TOTAL) BY MOUTH DAILY WITH BREAKFAST. 90 tablet 1  . hydrOXYzine (ATARAX/VISTARIL) 50 MG tablet Take 1 tablet (50 mg total) by mouth 2 (two) times daily as needed for anxiety. 180 tablet 1  . metFORMIN (GLUCOPHAGE) 1000 MG tablet TAKE 1 TABLET (1,000 MG TOTAL) BY MOUTH 2 (TWO) TIMES DAILY WITH A MEAL. 180 tablet 1  . montelukast (SINGULAIR) 10 MG tablet Take 1 tablet (10 mg total) by mouth at bedtime. 30 tablet 11  . ONETOUCH VERIO test strip USE AS INSTRUCTED TO CHECK THREE TIMES DAILY AND AS NEEDED. E11.8 300 strip  3  . pantoprazole (PROTONIX) 40 MG tablet TAKE 1 TABLET (40 MG TOTAL) BY MOUTH 2 (TWO) TIMES DAILY BEFORE A MEAL. 180 tablet 0  . Probiotic Product (PROBIOTIC DAILY PO) Take by mouth.    . simvastatin (ZOCOR) 20 MG tablet TAKE 1 TABLET BY MOUTH EVERY DAY IN THE EVENING 90 tablet 0  . canagliflozin (INVOKANA) 100 MG TABS tablet Take 1 tablet (100 mg total) by mouth daily before breakfast. (Patient not taking: Reported on 02/02/2019) 30 tablet 3   No facility-administered medications prior to visit.      Per HPI unless specifically indicated in ROS section below Review of Systems Objective:    BP 132/70   Pulse 78   Temp 97.9 F (36.6 C) (Temporal)   Wt 285 lb (129.3  kg)   SpO2 97%   BMI 40.89 kg/m   Wt Readings from Last 3 Encounters:  02/02/19 285 lb (129.3 kg)  01/02/19 286 lb 7 oz (129.9 kg)  12/22/18 278 lb (126.1 kg)    Physical Exam Vitals signs and nursing note reviewed.  Constitutional:      General: He is not in acute distress.    Appearance: Normal appearance. He is obese. He is not ill-appearing.  HENT:     Mouth/Throat:     Mouth: Mucous membranes are moist.     Pharynx: Oropharynx is clear. No posterior oropharyngeal erythema.  Neck:     Vascular: JVD present.  Cardiovascular:     Rate and Rhythm: Normal rate and regular rhythm.     Pulses: Normal pulses.     Heart sounds: Normal heart sounds. No murmur.  Pulmonary:     Effort: Pulmonary effort is normal. No respiratory distress.     Breath sounds: No wheezing, rhonchi or rales.     Comments: Crackles bibasilarly Musculoskeletal:     Right lower leg: Edema (tr) present.     Left lower leg: Edema (tr) present.  Skin:    General: Skin is warm and dry.     Findings: No rash.  Neurological:     Mental Status: He is alert.       Results for orders placed or performed in visit on 01/02/19  POCT glycosylated hemoglobin (Hb A1C)  Result Value Ref Range   Hemoglobin A1C 7.3 (A) 4.0 - 5.6 %   HbA1c POC (<> result, manual entry)     HbA1c, POC (prediabetic range)     HbA1c, POC (controlled diabetic range)     Assessment & Plan:   Problem List Items Addressed This Visit    Malaise and fatigue   Hyperlipidemia associated with type 2 diabetes mellitus (HCC)   Exertional dyspnea   Diabetes mellitus type 2, uncontrolled, with complications ([place])    Will price out SGLT2 in setting of CHF. Discussed monitoring for recurrent UTI, yeast infection, groin infection. Continue other meds. He has noted increase cbg's recently.       Chronic systolic CHF (congestive heart failure) ([place])    See above.       Anemia   Acute exacerbation of CHF (congestive heart failure) (HCC) -  Primary    Story/exam consistent with acute CHF exacerbation - will double lasix x3 days and he will call us Monday with an update. Pt agrees with plan.       Relevant Orders   Brain natriuretic peptide    Other Visit Diagnoses    Vitamin D deficiency       Special screening for malignant  neoplasm of prostate           No orders of the defined types were placed in this encounter.  Orders Placed This Encounter  Procedures  . Brain natriuretic peptide    Follow up plan: No follow-ups on file.  [name], MD

## 2019-02-05 ENCOUNTER — Other Ambulatory Visit: Payer: Self-pay | Admitting: Family Medicine

## 2019-02-05 ENCOUNTER — Telehealth: Payer: Self-pay

## 2019-02-05 MED ORDER — B-12 1000 MCG SL SUBL: 1.0000 | SUBLINGUAL_TABLET | Freq: Every day | SUBLINGUAL | Status: DC

## 2019-02-05 MED ORDER — VITAMIN D 50 MCG (2000 UT) PO CAPS: 1.0000 | ORAL_CAPSULE | Freq: Every day | ORAL | Status: AC

## 2019-02-05 MED ORDER — FERROUS SULFATE 325 (65 FE) MG PO TBEC: 325.0000 mg | DELAYED_RELEASE_TABLET | Freq: Every day | ORAL | Status: DC

## 2019-02-05 NOTE — Telephone Encounter (Signed)
Left message for patient to call back  

## 2019-02-05 NOTE — Telephone Encounter (Signed)
Patient advise.  

## 2019-02-05 NOTE — Telephone Encounter (Signed)
Best  number [phone] Pt returned your call

## 2019-02-07 ENCOUNTER — Encounter: Payer: Medicare Other | Admitting: Family Medicine

## 2019-02-08 ENCOUNTER — Encounter: Payer: Self-pay | Admitting: Gastroenterology

## 2019-02-08 ENCOUNTER — Other Ambulatory Visit: Payer: Self-pay

## 2019-02-08 ENCOUNTER — Ambulatory Visit (INDEPENDENT_AMBULATORY_CARE_PROVIDER_SITE_OTHER): Payer: Medicare Other | Admitting: Gastroenterology

## 2019-02-08 VITALS — BP 147/91 | HR 76 | Temp 98.2°F | Ht 70.0 in | Wt 284.2 lb

## 2019-02-08 DIAGNOSIS — E611 Iron deficiency: Secondary | ICD-10-CM

## 2019-02-08 NOTE — Progress Notes (Signed)
Gastroenterology Consultation  Referring Provider:     [name], MD Primary Care Physician:  [name], MD Primary Gastroenterologist:  Dr. Allen Norris     Reason for Consultation:     Iron deficiency anemia with gas and bloating        HPI:   [name] is a 63 y.o. y/o male referred for consultation & management of iron deficiency anemia with gas and bloating by Dr. [name], MD.  This patient comes in today with a history of iron deficiency anemia with an iron of 11 and a saturation of 2.2.  The patient was also anemic with a hemoglobin of 10.0 with a ferritin of 6.3.  The patient had also reported to his primary care provider that he was having a lot of gas and abdominal pain. The patient reports that he started taking some iron over-the-counter when he was feeling poorly at the suggestion of his sister and states that the abdominal pain and bloating went away.  He also states that he started to get better energy.  There is no report of any unexplained weight loss fevers chills nausea vomiting.  The patient has never had a colonoscopy in the past.  The patient does not see any rectal bleeding or black stools.  Past Medical History:  Diagnosis Date  . Abnormal drug screen 2015   MJ positive x3 - one more and we will stop prescribing ativan (08/2013).  . Angina   . Anxiety   . Arthritis   . CAD (coronary artery disease)    nonobstructive  . Cannabis abuse   . Chronic systolic CHF (congestive heart failure) ([place]) 2013   NYHA Class II/III  . COPD (chronic obstructive pulmonary disease) ([place])   . Diabetes type 2, uncontrolled ([place])    declines DSME  . Dilated cardiomyopathy ([place]) 2013   now improved  . Ex-smoker   . Frequent headaches   . History of atrial fibrillation 2013   chronic, s/p ablation prior on coumadin  . Hyperlipidemia   . Hypertension   . Migraine   . Nonischemic cardiomyopathy ([place]) 2013   EF 25% per Dr Nehemiah Massed  . Obesity   . OSA  (obstructive sleep apnea)    does not use CPAP - unable to tolerate  . Seasonal allergies     Past Surgical History:  Procedure Laterality Date  . ATRIAL FLUTTER ABLATION N/A 09/21/2011   Procedure: ATRIAL FLUTTER ABLATION;  Surgeon: Thompson Grayer, MD;  Location: [facility] CATH LAB;  Service: Cardiovascular;  Laterality: N/A;  . CARDIAC ELECTROPHYSIOLOGY [place] AND ABLATION  2013   for atrial flutter  . CARDIOVASCULAR STRESS TEST  03/2012   ETT WNL Nehemiah Massed)  . US ECHOCARDIOGRAPHY  2014   EF 50%, nl LV fxn, RV nl size/function, mild mitral insuff    Prior to Admission medications   Medication Sig Start Date End Date Taking? Authorizing Provider  ACCU-CHEK FASTCLIX LANCETS MISC Check blood sugar once daily and as instructed. Dx 250.00 05/10/13   [name], MD  albuterol (VENTOLIN HFA) 108 (90 Base) MCG/ACT inhaler Inhale 2 puffs into the lungs every 6 (six) hours as needed for wheezing or shortness of breath. 11/29/18   [name], MD  aspirin EC 81 MG tablet Take 81 mg by mouth daily.    [provider]  Blood Glucose Monitoring Suppl (BLOOD GLUCOSE MONITOR SYSTEM) W/DEVICE KIT by Does not apply route. Use to check sugar once daily and as needed Dx: E11.9 **ONE  TOUCH VERIO**    [provider]  canagliflozin (INVOKANA) 100 MG TABS tablet Take 1 tablet (100 mg total) by mouth daily before breakfast. Patient not taking: Reported on 02/02/2019 01/03/19   [name], MD  carvedilol (COREG) 6.25 MG tablet TAKE 1 TABLET BY MOUTH 2 TIMES DAILY WITH A MEAL. 12/13/18   [name], MD  Cholecalciferol (VITAMIN D) 50 MCG (2000 UT) CAPS Take 1 capsule (2,000 Units total) by mouth daily. 02/05/19   [name], MD  citalopram (CELEXA) 10 MG tablet TAKE 1 TABLET BY MOUTH EVERY DAY 09/12/18   [name], MD  Cyanocobalamin (B-12) 1000 MCG SUBL Place 1 tablet under the tongue daily. 02/05/19   [name], MD  ferrous sulfate 325 (65 FE) MG EC tablet  Take 1 tablet (325 mg total) by mouth daily with breakfast. 02/05/19   [name], MD  fluticasone [facility]) 50 MCG/ACT nasal spray SPRAY 2 SPRAYS INTO EACH NOSTRIL EVERY DAY 11/09/17   [name], MD  furosemide (LASIX) 20 MG tablet TAKE 1 TABLET (20 MG TOTAL) BY MOUTH 2 (TWO) TIMES DAILY AS NEEDED. 12/15/18   [name], MD  glimepiride (AMARYL) 2 MG tablet TAKE 1 TABLET (2 MG TOTAL) BY MOUTH DAILY WITH BREAKFAST. 08/18/18   [name], MD  hydrOXYzine (ATARAX/VISTARIL) 50 MG tablet Take 1 tablet (50 mg total) by mouth 2 (two) times daily as needed for anxiety. 11/29/18   [name], MD  metFORMIN (GLUCOPHAGE) 1000 MG tablet TAKE 1 TABLET (1,000 MG TOTAL) BY MOUTH 2 (TWO) TIMES DAILY WITH A MEAL. 07/18/18   [name], MD  montelukast (SINGULAIR) 10 MG tablet Take 1 tablet (10 mg total) by mouth at bedtime. 06/16/18   [name], MD  [facility] VERIO test strip USE AS INSTRUCTED TO CHECK THREE TIMES DAILY AND AS NEEDED. E11.8 11/30/18   [name], MD  pantoprazole (PROTONIX) 40 MG tablet TAKE 1 TABLET (40 MG TOTAL) BY MOUTH 2 (TWO) TIMES DAILY BEFORE A MEAL. 11/21/18   [name], MD  Probiotic Product (PROBIOTIC DAILY PO) Take by mouth.    [provider]  simvastatin (ZOCOR) 20 MG tablet TAKE 1 TABLET BY MOUTH EVERY DAY IN THE EVENING 01/04/19   [name], MD    Family History  Problem Relation Age of Onset  . Heart failure Father 71  . Cancer Mother 29       NHL  . Hypertension Maternal Grandfather   . CAD Maternal Grandfather 47       MI  . Diabetes Other        grandparents  . Cancer Brother 59       lung (smoker)     Social History   Tobacco Use  . Smoking status: Former Smoker    Packs/day: 1.00    Years: 31.00    Pack years: 31.00    Types: Cigarettes    Quit date: 04/26/2018    Years since quitting: 0.7  . Smokeless tobacco: Never Used  Substance Use Topics  . Alcohol use: No    Alcohol/week: 0.0  standard drinks  . Drug use: Yes    Types: Marijuana    Allergies as of 02/08/2019 - Review Complete 02/02/2019  Allergen Reaction Noted  . Atorvastatin Other (See Comments) 02/06/2013  . Lisinopril Cough 02/06/2013    Review of Systems:    All systems reviewed and negative except where noted in HPI.   Physical Exam:  There were no vitals taken for this visit. No  LMP for male patient. General:   Alert,  Well-developed, well-nourished, pleasant and cooperative in NAD Head:  Normocephalic and atraumatic. Eyes:  Sclera clear, no icterus.   Conjunctiva pink. Ears:  Normal auditory acuity. Nose:  No deformity, discharge, or lesions. Mouth:  No deformity or lesions,oropharynx pink & moist. Neck:  Supple; no masses or thyromegaly. Lungs:  Respirations even and unlabored.  Clear throughout to auscultation.   No wheezes, crackles, or rhonchi. No acute distress. Heart:  Regular rate and rhythm; no murmurs, clicks, rubs, or gallops. Abdomen:  Normal bowel sounds.  No bruits.  Soft, non-tender and non-distended without masses, hepatosplenomegaly or hernias noted.  No guarding or rebound tenderness.  Negative Carnett sign.   Rectal:  Deferred.  Msk:  Symmetrical without gross deformities.  Good, equal movement & strength bilaterally. Pulses:  Normal pulses noted. Extremities:  No clubbing or edema.  No cyanosis. Neurologic:  Alert and oriented x3;  grossly normal neurologically. Skin:  Intact without significant lesions or rashes.  No jaundice. Lymph Nodes:  No significant cervical adenopathy. Psych:  Alert and cooperative. Normal mood and affect.  Imaging Studies: No results found.  Assessment and Plan:   [name] is a 63 y.o. y/o male who comes in today with a finding of iron deficiency anemia.  The patient has never had a colonoscopy or an EGD.  The patient had low iron saturation and total iron.  His ferritin was also low.  The patient has been told that he will need an EGD  and colonoscopy.  The patient's abdominal pain and bloating with gassiness has gone away since he started taking the iron.  The patient will have the EGD and colonoscopy for his anemia. I have discussed risks & benefits which include, but are not limited to, bleeding, infection, perforation & drug reaction.  The patient agrees with this plan & written consent will be obtained.     This visit consisted of 35 minutes face to face contact with myself and at least 50% of this time was spent in counseling and education regarding diagnosis, treatment options, medication management, risks and benefits of treatment.  Lucilla Lame, MD. Marval Regal    Note: This dictation was prepared with Dragon dictation along with smaller phrase technology. Any transcriptional errors that result from this process are unintentional.

## 2019-02-19 ENCOUNTER — Telehealth: Payer: Self-pay | Admitting: Gastroenterology

## 2019-02-19 NOTE — Telephone Encounter (Signed)
Pt left vm to put of his procedure and cancel  for 02/27/19 due to the Virus.

## 2019-02-19 NOTE — Telephone Encounter (Signed)
Pt has been cancelled with Endoscopy.

## 2019-02-20 ENCOUNTER — Other Ambulatory Visit: Payer: Self-pay | Admitting: Family Medicine

## 2019-02-20 NOTE — Telephone Encounter (Signed)
E-scribed refill 

## 2019-02-27 ENCOUNTER — Ambulatory Visit: Admit: 2019-02-27 | Payer: Medicare Other | Admitting: Gastroenterology

## 2019-02-27 SURGERY — COLONOSCOPY WITH PROPOFOL
Anesthesia: General

## 2019-03-06 ENCOUNTER — Other Ambulatory Visit: Payer: Self-pay | Admitting: Family Medicine

## 2019-03-08 ENCOUNTER — Other Ambulatory Visit: Payer: Self-pay | Admitting: Family Medicine

## 2019-03-09 NOTE — Telephone Encounter (Signed)
Pt will have labs on 12/24 and AWV with Dr. Darnell Level on 12/31. Pt said he usually has his medicare wellness with a nurse that comes to his house. He was unsure so I put that he needs paperwork in the notes.

## 2019-03-09 NOTE — Telephone Encounter (Signed)
E-scribed refill.  Pls schedule wellness visit.

## 2019-04-06 ENCOUNTER — Other Ambulatory Visit: Payer: Self-pay | Admitting: Family Medicine

## 2019-04-09 ENCOUNTER — Telehealth: Payer: Self-pay | Admitting: *Deleted

## 2019-04-09 MED ORDER — JARDIANCE 25 MG PO TABS: 25.0000 mg | ORAL_TABLET | Freq: Every day | ORAL | 6 refills | Status: DC

## 2019-04-09 NOTE — Telephone Encounter (Signed)
Patient called stating that he got a letter from his [employer] stating that after the first of the year 2021 they will not cover his Invokana. Patient stated that they offer two alternative medications which are [place]. Patient wants to know if his [name] can be changed? Patient stated that he does not need this done until the first of the year. Patient requested a call back regarding this.  Pharmacy [facility]

## 2019-04-09 NOTE — Telephone Encounter (Signed)
Ok to do - I have sent jardiance 25 mg to his pharmacy with instructions to fill after 04/27/2019.

## 2019-04-09 NOTE — Telephone Encounter (Signed)
Spoke with relaying Dr. G's message.  Pt verbalizes understanding.    

## 2019-04-16 ENCOUNTER — Other Ambulatory Visit: Payer: Self-pay | Admitting: Family Medicine

## 2019-04-19 ENCOUNTER — Other Ambulatory Visit: Payer: Self-pay | Admitting: Family Medicine

## 2019-04-19 ENCOUNTER — Other Ambulatory Visit: Payer: Medicare Other

## 2019-04-19 DIAGNOSIS — E559 Vitamin D deficiency, unspecified: Secondary | ICD-10-CM | POA: Insufficient documentation

## 2019-04-19 DIAGNOSIS — IMO0002 Reserved for concepts with insufficient information to code with codable children: Secondary | ICD-10-CM

## 2019-04-19 DIAGNOSIS — D509 Iron deficiency anemia, unspecified: Secondary | ICD-10-CM

## 2019-04-19 DIAGNOSIS — E785 Hyperlipidemia, unspecified: Secondary | ICD-10-CM

## 2019-04-19 DIAGNOSIS — E538 Deficiency of other specified B group vitamins: Secondary | ICD-10-CM

## 2019-04-19 DIAGNOSIS — E1165 Type 2 diabetes mellitus with hyperglycemia: Secondary | ICD-10-CM

## 2019-04-19 DIAGNOSIS — E1169 Type 2 diabetes mellitus with other specified complication: Secondary | ICD-10-CM

## 2019-04-26 ENCOUNTER — Encounter: Payer: Medicare Other | Admitting: Family Medicine

## 2019-05-02 ENCOUNTER — Telehealth: Payer: Self-pay

## 2019-05-02 NOTE — Telephone Encounter (Signed)
pt left v/m has head congestion on rt side and sharp pain in rt sinus that is behind eye and pain comes and goes and this has happened 4-5 other times and pt always gets abx. This time pt has had symptoms for 3 days.Pt request abx.  pt last FU 02/02/19. [facility]. Pt is not having h/a or dizziness. No fever or chills, no increase in usual SOB. Pt has had head congestion and pain behind eye but otherwise feels good and pt does not want to schedule virtual or in office appt. Pt request cb after Dr Darnell Level reviews this note.

## 2019-05-03 ENCOUNTER — Telehealth: Payer: Self-pay | Admitting: Family Medicine

## 2019-05-03 ENCOUNTER — Other Ambulatory Visit: Payer: Self-pay | Admitting: Family Medicine

## 2019-05-03 NOTE — Telephone Encounter (Signed)
Pt called requesting an update as he called yesterday and never received a call back.   Please advise, thanks.

## 2019-05-03 NOTE — Telephone Encounter (Signed)
Replied via other message.  

## 2019-05-03 NOTE — Telephone Encounter (Signed)
Left message on vm per dpr relaying Dr. G's message.  

## 2019-05-03 NOTE — Telephone Encounter (Signed)
See TE, 05/02/19.

## 2019-05-03 NOTE — Telephone Encounter (Signed)
Last time we needed abx for sinus infection was 06/2018.  Sinus infections usually start out as viral and abx will not help - recommend flonase, nasal saline, tylenol for discomfort. If not improving after 7-10 days let us know, would offer virtual or in office visit at that time.

## 2019-05-07 ENCOUNTER — Telehealth: Payer: Self-pay | Admitting: Family Medicine

## 2019-05-07 NOTE — Telephone Encounter (Signed)
Opened in error

## 2019-05-08 ENCOUNTER — Other Ambulatory Visit: Payer: Self-pay

## 2019-05-08 DIAGNOSIS — J9859 Other diseases of mediastinum, not elsewhere classified: Secondary | ICD-10-CM

## 2019-06-05 ENCOUNTER — Other Ambulatory Visit: Payer: Self-pay | Admitting: Family Medicine

## 2019-06-10 ENCOUNTER — Other Ambulatory Visit: Payer: Self-pay | Admitting: Family Medicine

## 2019-06-13 ENCOUNTER — Ambulatory Visit: Payer: Medicare Other

## 2019-06-13 ENCOUNTER — Telehealth: Payer: Self-pay | Admitting: Family Medicine

## 2019-06-13 NOTE — Chronic Care Management (AMB) (Signed)
  Chronic Care Management   Note  06/13/2019 Name: [name] MRN: [id number] DOB: October 27, 1955  [name] is a 64 y.o. year old male who is a primary care patient of [name], MD. I reached out to [name] by phone today in response to a referral sent by [name]'s PCP, [name], MD.   [name] was given information about Chronic Care Management services today including:  1. CCM service includes personalized support from designated clinical staff supervised by his physician, including individualized plan of care and coordination with other care providers 2. 24/7 contact phone numbers for assistance for urgent and routine care needs. 3. Service will only be billed when office clinical staff spend 20 minutes or more in a month to coordinate care. 4. Only one practitioner may furnish and bill the service in a calendar month. 5. The patient may stop CCM services at any time (effective at the end of the month) by phone call to the office staff. 6. The patient will be responsible for cost sharing (co-pay) of up to 20% of the service fee (after annual deductible is met).  Patient agreed to services and verbal consent obtained.   Follow up plan:   Raynicia Dukes [employer] Scheduler

## 2019-06-13 NOTE — Progress Notes (Signed)
  Chronic Care Management   Outreach Note  06/13/2019 Name: [name] MRN: [id number] DOB: Sep 11, 1955  Referred by: [name], MD Reason for referral : No chief complaint on file.   An unsuccessful telephone outreach was attempted today. The patient was referred to the pharmacist for assistance with care management and care coordination.   Follow Up Plan:   Raynicia Dukes [employer] Scheduler

## 2019-06-14 ENCOUNTER — Other Ambulatory Visit: Payer: Self-pay | Admitting: Family Medicine

## 2019-06-15 ENCOUNTER — Telehealth: Payer: Self-pay

## 2019-06-15 DIAGNOSIS — I4891 Unspecified atrial fibrillation: Secondary | ICD-10-CM

## 2019-06-15 DIAGNOSIS — I4892 Unspecified atrial flutter: Secondary | ICD-10-CM

## 2019-06-15 DIAGNOSIS — I10 Essential (primary) hypertension: Secondary | ICD-10-CM

## 2019-06-15 NOTE — Telephone Encounter (Signed)
Left message for patient to return call to office. Patient did not have CT chest w/o contrast done on 06/12/2019. He has follow up appointment with Dr.Oaks 06/22/2019. We will need to reschedule appointment unless patient is refusing to have CT chest done, then he can keep his appointment. If he decides to have CT chest done then I have rescheduled CT chest w/o contrast, however an appointment to see DR.Genevive Bi will need to be scheduled after 06/25/2019.Please schedule when speaking with patient.

## 2019-06-15 NOTE — Telephone Encounter (Signed)
Noted.  E-scribed refill. 

## 2019-06-15 NOTE — Telephone Encounter (Signed)
2/19 left message for patient to call the office. Need to schedule awv, & labs.

## 2019-06-15 NOTE — Telephone Encounter (Signed)
Patient returned call. he stated he will not be scheduling these appointments until he has both of his covid vaccines.

## 2019-06-15 NOTE — Telephone Encounter (Signed)
E-scribed refill.  Pls schedule annual wellness visit.

## 2019-06-15 NOTE — Telephone Encounter (Signed)
Spoke with patient -he is refusing to have CT chest and follow up with Dr.Oaks at this time. He stated he would call when he has the vaccine for Covid as he does not feel safe coming to any medical facility due to Covid. He stated he would call once he has the vaccine to schedule appointments.

## 2019-06-18 ENCOUNTER — Telehealth: Payer: Self-pay

## 2019-06-18 DIAGNOSIS — I10 Essential (primary) hypertension: Secondary | ICD-10-CM

## 2019-06-18 DIAGNOSIS — I4891 Unspecified atrial fibrillation: Secondary | ICD-10-CM

## 2019-06-18 DIAGNOSIS — I4892 Unspecified atrial flutter: Secondary | ICD-10-CM

## 2019-06-18 NOTE — Telephone Encounter (Signed)
I would like to request a referral for [name] to chronic care management pharmacy services for the following conditions:   Essential hypertension, benign  [I10]  Atrial fibrillation ([place]) [I48.91]  Debbora Dus, PharmD Clinical Pharmacist [facility] [phone]

## 2019-06-18 NOTE — Chronic Care Management (AMB) (Signed)
Chronic Care Management Pharmacy  Name: [name]  MRN: [id number] DOB: [birth year]  Chief Complaint/ HPI  [name],  64 y.o., male presents for their Initial CCM visit with the clinical pharmacist via telephone.  PCP : [name], MD  Their chronic conditions include: atrial fibrillation/flutter, hypertension, coronary artery disease, heart failure, restrictive lung disease, chronic bronchitis, hyperlipidemia, type 2 diabetes, chronic pain, panic attacks, vitamin D deficiency  Patient concerns: denies medication concerns, prefers to be on less medication  Office Visits:  02/02/19: Danise Mina - check price on SGLT-2 for DM/CHF, increase lasix x 3 days for CHF exacerbation  01/02/19: Danise Mina - epigastirc pain, severe on PPI BID and probiotic; anemia, needs colon screening, refer to GI  Consult Visit:  02/08/19: Allen Norris, gastroenterology - iron deficiency anemia, gas, bloating, plan EGD and colonoscopy   Allergies  Allergen Reactions  . Atorvastatin Other (See Comments)    Dizziness  . Lisinopril Cough   Medications: Outpatient Encounter Medications as of 06/19/2019  Medication Sig  . ACCU-CHEK FASTCLIX LANCETS MISC Check blood sugar once daily and as instructed. Dx 250.00  . albuterol (VENTOLIN HFA) 108 (90 Base) MCG/ACT inhaler Inhale 2 puffs into the lungs every 6 (six) hours as needed for wheezing or shortness of breath.  Marland Kitchen aspirin EC 81 MG tablet Take 81 mg by mouth daily.  . Blood Glucose Monitoring Suppl (BLOOD GLUCOSE MONITOR SYSTEM) W/DEVICE KIT by Does not apply route. Use to check sugar once daily and as needed Dx: E11.9 **ONE TOUCH VERIO**  . canagliflozin (INVOKANA) 100 MG TABS tablet Take 1 tablet (100 mg total) by mouth daily before breakfast.  . carvedilol (COREG) 6.25 MG tablet TAKE 1 TABLET BY MOUTH 2 TIMES DAILY WITH A MEAL.  Marland Kitchen Cholecalciferol (VITAMIN D) 50 MCG (2000 UT) CAPS Take 1 capsule (2,000 Units total) by mouth daily.  . citalopram  (CELEXA) 10 MG tablet TAKE 1 TABLET BY MOUTH EVERY DAY  . Cyanocobalamin (B-12) 1000 MCG SUBL Place 1 tablet under the tongue daily.  . empagliflozin (JARDIANCE) 25 MG TABS tablet Take 25 mg by mouth daily before breakfast.  . ferrous sulfate 325 (65 FE) MG EC tablet Take 1 tablet (325 mg total) by mouth daily with breakfast.  . fluticasone (FLONASE) 50 MCG/ACT nasal spray SPRAY 2 SPRAYS INTO EACH NOSTRIL EVERY DAY  . furosemide (LASIX) 20 MG tablet TAKE 1 TABLET (20 MG TOTAL) BY MOUTH 2 (TWO) TIMES DAILY AS NEEDED.  Marland Kitchen glimepiride (AMARYL) 2 MG tablet TAKE 1 TABLET (2 MG TOTAL) BY MOUTH DAILY WITH BREAKFAST. (Patient not taking: Reported on 02/08/2019)  . hydrOXYzine (ATARAX/VISTARIL) 50 MG tablet Take 1 tablet (50 mg total) by mouth 2 (two) times daily as needed for anxiety.  . metFORMIN (GLUCOPHAGE) 1000 MG tablet TAKE 1 TABLET (1,000 MG TOTAL) BY MOUTH 2 (TWO) TIMES DAILY WITH A MEAL.  . montelukast (SINGULAIR) 10 MG tablet TAKE 1 TABLET BY MOUTH EVERYDAY AT BEDTIME  . ONETOUCH VERIO test strip USE AS INSTRUCTED TO CHECK THREE TIMES DAILY AND AS NEEDED. E11.8  . pantoprazole (PROTONIX) 40 MG tablet TAKE 1 TABLET (40 MG TOTAL) BY MOUTH 2 (TWO) TIMES DAILY BEFORE A MEAL.  . Probiotic Product (PROBIOTIC DAILY PO) Take by mouth.  . simvastatin (ZOCOR) 20 MG tablet TAKE 1 TABLET BY MOUTH EVERY DAY IN THE EVENING   No facility-administered encounter medications on file as of 06/19/2019.   Current Diagnosis/Assessment: Goals    . Pharmacy Care Plan  Current Barriers:  . Chronic Disease Management support, education, and care coordination needs related to atrial fibrillation/flutter, hypertension, coronary artery disease, heart failure, restrictive lung disease, chronic bronchitis, hyperlipidemia, type 2 diabetes, chronic pain, panic attacks, vitamin D deficiency  Pharmacist Clinical Goal(s):  . Reduce cost burden of medications (hydroxyzine and Jardiance). Pharmacist will compare pharmacies to  see if insurance has a preferred option with lower copays and apply for medication assistance if eligible. . Achieve control of blood sugars with goal A1c < 7%. Patient plans to increase walking, make dietary changes, and continue to monitor blood glucose daily. . Reduce risk of stroke with atrial fibrillation. Pharmacist will coordinate with cardiology for less expensive anticoagulant. . Remain up to date on vaccinations. Recommend tetanus (Tdap) vaccine and shingles (Shingrix) vaccine. . Maintain cholesterol within goal of less than 70 mg/dL. Continue current therapy of simvastatin 20 mg once daily at bedtime. Current LDL: 86 mg/dL; Recommend switching to a higher potency statin such as rosuvastatin 20 mg or adding ezetimibe.   . Maintain allergy symptom control. Recommend safer alternative to diphenhydramine, such as cetirizine 10 mg daily.  Interventions: . Comprehensive medication review performed.  Patient Self Care Activities:  . Self administers medications as prescribed . Self-monitors blood glucose daily  Initial goal documentation     . weight management (pt-stated)     Starting 10/04/2016, I will continue to increase intake of fish and vegetables as well as decrease intake of sugar and bread in an effort to lose weight. Target weight is 250 lbs.       Diabetes   Recent Relevant Labs: Lab Results  Component Value Date/Time   HGBA1C 7.3 (A) 01/02/2019 09:03 AM   HGBA1C 7.5 (H) 09/19/2018 10:52 AM   HGBA1C 7.1 (H) 05/24/2018 10:34 AM   HGBA1C 7.0 (H) 01/24/2013 04:07 AM   MICROALBUR 1.0 10/17/2017 11:35 AM   MICROALBUR 0.9 10/04/2016 09:23 AM    Checking BG: 3-4 times a day Recent FBG Readings: 190 (today), 218, 209 Recent 2hr PP BG readings: 220, 240, 480, 322, 233, 232, 239, 248, 374  Patient has failed these meds in past: insurance will not pay for [place] Patient is currently uncontrolled on the following medications:   Jardiance 25 mg - 1 tablet daily before  breakfast   Glimepiride 2 mg - 1 tablet daily with breakfast  Metformin 1000 mg - 1 tablet twice daily with meals  Last diabetic eye exam:  Lab Results  Component Value Date/Time   HMDIABEYEEXA No Retinopathy 05/10/2017 12:00 AM    Last diabetic foot exam: recommend annual foot exam   We discussed: not paying as much attention to diet due to busy working around house; enjoys sandwiches, grabs take out, night owl, eats at night; eating a lot of bread - Pt would like to make dietary changes: buy frozen vegetables, frozen fish, frozen chicken for quick meals. Would like to limit purchase of certain grocery items (desserts, bread).  Exercise: none now, would like to start walking  Current weight: 260 lbs  Plan: Continue current medications; Patient would like to work on dietary/excerise changes. Unclear if pt has benefited from SGLT-2 as BG has increased since October 2020 when initiated. If no changes in BG with dietary change in 1 month, will consider medications changes.  AFIB   Last cardiology visit 06/2018 Patient is currently rate controlled CHA2DS2-VASc score: 2 (CHF and diabetes mellitus) CPAP: wears occasionally, not daily, tried several different kinds of masks  Patient has tried these  meds in past: warfarin, discontinued after ablation 2014 Patient is currently controlled on the following medications:   Carvedilol 6.25 mg - 1 tablet twice daily with meal  We discussed: Cardiologist started Eliquis 07/04/18, cost was too high through insurance, discussed starting alternative but it seems no alternative was started; pt prefers not to see cardiology too often due to copay; Both Eliquis and Xarelto are tier 3 with his insurance.  Plan: Continue current medications; Consult with PCP and cardiology regarding Eliquis or less expensive alternative initiation.   Hyperlipidemia/CAD   Lipid Panel     Component Value Date/Time   CHOL 154 02/02/2019 0833   CHOL 203 (H) 01/24/2013  0407   TRIG 344.0 (H) 02/02/2019 0833   TRIG 355 (H) 01/24/2013 0407   HDL 39.00 (L) 02/02/2019 0833   HDL 34 (L) 01/24/2013 0407   CHOLHDL 4 02/02/2019 0833   VLDL 68.8 (H) 02/02/2019 0833   VLDL 71 (H) 01/24/2013 0407   LDLCALC 90 09/07/2013 1119   LDLCALC 98 01/24/2013 0407   LDLDIRECT 86.0 02/02/2019 0833   LDL goal < 70 (CAD) Patient has failed these meds in past: atorvastatin (dizziness) Patient is currently uncontrolled on the following medications:   Aspirin 81 mg - once daily  Simvastatin 20 mg - 1 tablet daily  We discussed: cardiac history, denies personal history of heart attack or stroke; started on atorvastatin 20 mg in 2014 per hospital discharge, did not tolerate.  Plan: Continue moderate intensity statin. May consider rosuvastatin 20 mg or adding on Zetia due to LDL > 70.  Restrictive Lung Disease   Last spirometry score: (06/16/18) post-FEV1 49%  Eosinophil count:   Lab Results  Component Value Date/Time   EOSPCT 2.6 02/02/2019 08:33 AM  %                               Eos (Absolute):  Lab Results  Component Value Date/Time   EOSABS 0.2 02/02/2019 08:33 AM   Tobacco Status: denies current use Social History   Tobacco Use  Smoking Status Former Smoker  . Packs/day: 1.00  . Years: 31.00  . Pack years: 31.00  . Types: Cigarettes  . Quit date: 04/26/2018  . Years since quitting: 1.1  Smokeless Tobacco Never Used   Patient has failed these meds in past: none Patient is currently controlled on the following medications:   Albuterol 90 mcg - 2 puffs every 6 hours as needed Using maintenance inhaler regularly? None prescribed Frequency of rescue inhaler use: hasn't had to use albuterol since starting iron  Plan: Continue current medications   Heart Failure   Type: Systolic Symptoms: denies SOB, able to walk just not fast Weighs himself 4 times a day and checks swelling in legs/ankles  Last ejection fraction: 05/2017 50% (improved) NYHA Class:  II (slight limitation of activity) AHA HF Stage: C (Heart disease and symptoms present)  Patient has failed these meds in past: lisinopril (cough) Patient is currently controlled on the following medications:   Carvedilol 6.25 mg - 1 tablet twice daily with meal  Furosemide 20 mg - 1 tablet twice daily  Jardiance 25 mg - 1 tablet daily   We discussed weighing daily; if you gain more than 3 pounds in one day or 5 pounds in one week call your doctor; pt reports he checks his fluid status by watching for edema in legs  Plan: Continue current medications ,  Hypertension  Office blood pressures are: BP Readings from Last 3 Encounters:  02/08/19 (!) 147/91  02/02/19 132/70  01/02/19 132/76   CMP Latest Ref Rng & Units 02/02/2019 12/22/2018 09/19/2018  Glucose 70 - 99 mg/dL 176(H) 175(H) 139(H)  BUN 6 - 23 mg/dL 9 6(L) 8  Creatinine 0.40 - 1.50 mg/dL 0.65 0.64 0.63  Sodium 135 - 145 mEq/L 140 138 140  Potassium 3.5 - 5.1 mEq/L 5.0 3.9 4.2  Chloride 96 - 112 mEq/L 100 100 101  CO2 19 - 32 mEq/L 33(H) 30 32  Calcium 8.4 - 10.5 mg/dL 9.6 9.1 9.3  Total Protein 6.0 - 8.3 g/dL 6.5 7.0 6.6  Total Bilirubin 0.2 - 1.2 mg/dL 0.3 0.7 0.4  Alkaline Phos 39 - 117 U/L 60 63 63  AST 0 - 37 U/L '25 21 14  ' ALT 0 - 53 U/L '23 19 14   ' Patient has failed these meds in the past: none reported Patient checks BP at home: rarely Patient home BP readings are ranging: none reported  Patient is currently controlled on the following medications:   Carvedilol 6.25 mg - 1 tablet twice daily with meal  Furosemide 20 mg - 1 tablet twice daily  We discussed: Confirmed adherence, reports he is always lightheaded, dizzy due to lack of sleep. Reports sleep is poor, only sleeps in 15 minute intervals, has been this way for a long time. Denies CPAP use, reports he could not tolerate it.  Plan: Continue current medications     Panic Attacks   Patient has failed these meds in past: Xanax Patient is currently  controlled on the following medications:   Citalopram 10 mg - 1 tablet daily  Hydroxyzine 50 mg - 1 tablet twice daily as needed  We discussed: takes 2 hydroxyzine as needed, cost of hydroxyzine is high  Plan: Continue current medications; Discussed alternatives for hydroxyzine due to cost, such as increasing citalopram. Pt would like to continue current therapy as it works well.   Vitamin D/Iron/B12 Deficiency  Vitamin B12: (02/02/19) 231 Vitamin D (02/02/19): 14 (low) Iron, Ferritin, TSAT (02/02/19): low  Patient has failed these meds in past: none Patient is currently on the following medications (labs have not yet been updated):   Vitamin D 2000 IU - 1 capsule daily (50 mcg)  Vitamin B12 1000 mcg - 1 tablet daily  Ferrous sulfate 325 mg - 2 tablet daily  Plan: Continue current medications; Consider updating vitamin D and iron panel.   Allergic Rhinitis   Patient is currently controlled on the following medications:   Flonase 50 mcg - 2 sprays in each nostril daily  Montelukast 10 mg - 1 tablet daily at bedtime  We discussed: uses Flonase as needed for sinus flares  Plan: Continue current medications  GERD   Patient has failed these meds in past: none  Patient is currently controlled on the following medications:   Pantoprazole 40 mg - 1 tablet twice daily before meals  Probiotic daily  Plan: Continue current medications  Medication Management:  OTCs: [employer], [employer] Allergy/Sinus (APAP 325 mg, Diphenhydramine 12.5 mg, Phenylephrine 5 mg - 1 tablet daily); recommend switching to 2nd gen. 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

## 2019-06-18 NOTE — Telephone Encounter (Signed)
I would like to request a referral for [name] to chronic care management pharmacy services for the following conditions:   Essential hypertension, benign  [I10]  Atrial fibrillation ([place]) [I48.91]  Debbora Dus, PharmD Clinical Pharmacist [facility] at [facility] [phone]

## 2019-06-19 ENCOUNTER — Ambulatory Visit: Payer: Medicare Other

## 2019-06-19 ENCOUNTER — Other Ambulatory Visit: Payer: Self-pay

## 2019-06-19 DIAGNOSIS — E1165 Type 2 diabetes mellitus with hyperglycemia: Secondary | ICD-10-CM

## 2019-06-19 DIAGNOSIS — J42 Unspecified chronic bronchitis: Secondary | ICD-10-CM

## 2019-06-19 DIAGNOSIS — E785 Hyperlipidemia, unspecified: Secondary | ICD-10-CM

## 2019-06-19 DIAGNOSIS — IMO0002 Reserved for concepts with insufficient information to code with codable children: Secondary | ICD-10-CM

## 2019-06-19 DIAGNOSIS — E559 Vitamin D deficiency, unspecified: Secondary | ICD-10-CM

## 2019-06-19 DIAGNOSIS — I10 Essential (primary) hypertension: Secondary | ICD-10-CM

## 2019-06-19 DIAGNOSIS — E1169 Type 2 diabetes mellitus with other specified complication: Secondary | ICD-10-CM

## 2019-06-19 DIAGNOSIS — G894 Chronic pain syndrome: Secondary | ICD-10-CM

## 2019-06-19 DIAGNOSIS — F410 Panic disorder [episodic paroxysmal anxiety] without agoraphobia: Secondary | ICD-10-CM

## 2019-06-19 DIAGNOSIS — I2510 Atherosclerotic heart disease of native coronary artery without angina pectoris: Secondary | ICD-10-CM

## 2019-06-19 DIAGNOSIS — I4891 Unspecified atrial fibrillation: Secondary | ICD-10-CM

## 2019-06-19 DIAGNOSIS — E538 Deficiency of other specified B group vitamins: Secondary | ICD-10-CM

## 2019-06-19 DIAGNOSIS — I5022 Chronic systolic (congestive) heart failure: Secondary | ICD-10-CM

## 2019-06-19 DIAGNOSIS — J984 Other disorders of lung: Secondary | ICD-10-CM

## 2019-06-19 NOTE — Patient Instructions (Addendum)
June 19, 2019  Dear [name],  It was a pleasure meeting you during our initial appointment on June 19, 2019. Below is a summary of the goals we discussed and components of chronic care management. Please contact me anytime with questions or concerns.   Visit Information  Goals Addressed            This Visit's Progress   . Pharmacy Care Plan       Current Barriers:  . Chronic Disease Management support, education, and care coordination needs related to atrial fibrillation/flutter, hypertension, coronary artery disease, heart failure, restrictive lung disease, chronic bronchitis, hyperlipidemia, type 2 diabetes, chronic pain, panic attacks, vitamin D deficiency  Pharmacist Clinical Goal(s):  . Reduce cost burden of medications (hydroxyzine and Jardiance). Pharmacist will compare pharmacies to see if insurance has a preferred option with lower copays and apply for medication assistance if eligible. . Achieve control of blood sugars with goal A1c < 7%. Patient plans to increase walking, make dietary changes, and continue to monitor blood glucose daily. . Reduce risk of stroke with atrial fibrillation. Pharmacist will coordinate with cardiology for less expensive anticoagulant. . Remain up to date on vaccinations. Recommend tetanus (Tdap) vaccine and shingles (Shingrix) vaccine. . Maintain cholesterol within goal of less than 70 mg/dL. Continue current therapy of simvastatin 20 mg once daily at bedtime. Current LDL: 86 mg/dL; Recommend switching to a higher potency statin such as rosuvastatin 20 mg or adding ezetimibe.   . Maintain allergy symptom control. Recommend safer alternative to diphenhydramine, such as cetirizine 10 mg daily.  Interventions: . Comprehensive medication review performed.  Patient Self Care Activities:  . Self administers medications as prescribed . Self-monitors blood glucose daily  Initial goal documentation        [name] was given  information about Chronic Care Management services today including:  1. CCM service includes personalized support from designated clinical staff supervised by his physician, including individualized plan of care and coordination with other care providers 2. 24/7 contact phone numbers for assistance for urgent and routine care needs. 3. Service will only be billed when office clinical staff spend 20 minutes or more in a month to coordinate care. 4. Only one practitioner may furnish and bill the service in a calendar month. 5. The patient may stop CCM services at any time (effective at the end of the month) by phone call to the office staff. 6. The patient will be responsible for cost sharing (co-pay) of up to 20% of the service fee (after annual deductible is met).  Patient agreed to services and verbal consent obtained.   Print copy of patient instructions provided.  Telephone follow up appointment with pharmacy team member scheduled for:  July 20, 2019 at 8:30 AM telephone, 1 month check in on diabetes and dietary changes    Debbora Dus, PharmD Clinical Pharmacist [facility]   [place] stands for "Dietary Approaches to Stop Hypertension." The DASH eating plan is a healthy eating plan that has been shown to reduce high blood pressure (hypertension). It may also reduce your risk for type 2 diabetes, heart disease, and stroke. The DASH eating plan may also help with weight loss. What are tips for following this plan?  General guidelines  Avoid eating more than 2,300 mg (milligrams) of salt (sodium) a day. If you have hypertension, you may need to reduce your sodium intake to 1,500 mg a day.  Limit alcohol intake to  no more than 1 drink a day for nonpregnant women and 2 drinks a day for men. One drink equals 12 oz of beer, 5 oz of wine, or 1 oz of hard liquor.  Work with your health care provider to maintain a healthy body weight or  to lose weight. Ask what an ideal weight is for you.  Get at least 30 minutes of exercise that causes your heart to beat faster (aerobic exercise) most days of the week. Activities may include walking, swimming, or biking.  Work with your health care provider or diet and nutrition specialist (dietitian) to adjust your eating plan to your individual calorie needs. Reading food labels   Check food labels for the amount of sodium per serving. Choose foods with less than 5 percent of the Daily Value of sodium. Generally, foods with less than 300 mg of sodium per serving fit into this eating plan.  To find whole grains, look for the word "whole" as the first word in the ingredient list. Shopping  Buy products labeled as "low-sodium" or "no salt added."  Buy fresh foods. Avoid canned foods and premade or frozen meals. Cooking  Avoid adding salt when cooking. Use salt-free seasonings or herbs instead of table salt or sea salt. Check with your health care provider or pharmacist before using salt substitutes.  Do not fry foods. Cook foods using healthy methods such as baking, boiling, grilling, and broiling instead.  Cook with heart-healthy oils, such as olive, canola, soybean, or sunflower oil. Meal planning  Eat a balanced diet that includes: ? 5 or more servings of fruits and vegetables each day. At each meal, try to fill half of your plate with fruits and vegetables. ? Up to 6-8 servings of whole grains each day. ? Less than 6 oz of lean meat, poultry, or fish each day. A 3-oz serving of meat is about the same size as a deck of cards. One egg equals 1 oz. ? 2 servings of low-fat dairy each day. ? A serving of nuts, seeds, or beans 5 times each week. ? Heart-healthy fats. Healthy fats called Omega-3 fatty acids are found in foods such as flaxseeds and coldwater fish, like sardines, salmon, and mackerel.  Limit how much you eat of the following: ? Canned or prepackaged foods. ? Food that  is high in trans fat, such as fried foods. ? Food that is high in saturated fat, such as fatty meat. ? Sweets, desserts, sugary drinks, and other foods with added sugar. ? Full-fat dairy products.  Do not salt foods before eating.  Try to eat at least 2 vegetarian meals each week.  Eat more home-cooked food and less restaurant, buffet, and fast food.  When eating at a restaurant, ask that your food be prepared with less salt or no salt, if possible. What foods are recommended? The items listed may not be a complete list. Talk with your dietitian about what dietary choices are best for you. Grains Whole-grain or whole-wheat bread. Whole-grain or whole-wheat pasta. Brown rice. [place]. Bulgur. Whole-grain and low-sodium cereals. Pita bread. Low-fat, low-sodium crackers. Whole-wheat flour tortillas. Vegetables Fresh or frozen vegetables (raw, steamed, roasted, or grilled). Low-sodium or reduced-sodium tomato and vegetable juice. Low-sodium or reduced-sodium tomato sauce and tomato paste. Low-sodium or reduced-sodium canned vegetables. Fruits All fresh, dried, or frozen fruit. Canned fruit in natural juice (without added sugar). Meat and other protein foods Skinless chicken or [place]. Ground chicken or [place]. Pork with fat trimmed off. Fish and  seafood. Egg whites. Dried beans, peas, or lentils. Unsalted nuts, nut butters, and seeds. Unsalted canned beans. Lean cuts of beef with fat trimmed off. Low-sodium, lean deli meat. Dairy Low-fat (1%) or fat-free (skim) milk. Fat-free, low-fat, or reduced-fat cheeses. Nonfat, low-sodium ricotta or cottage cheese. Low-fat or nonfat yogurt. Low-fat, low-sodium cheese. Fats and oils Soft margarine without trans fats. Vegetable oil. Low-fat, reduced-fat, or light mayonnaise and salad dressings (reduced-sodium). Canola, safflower, olive, soybean, and sunflower oils. Avocado. Seasoning and other foods Herbs. Spices. Seasoning mixes without salt.  Unsalted popcorn and pretzels. Fat-free sweets. What foods are not recommended? The items listed may not be a complete list. Talk with your dietitian about what dietary choices are best for you. Grains Baked goods made with fat, such as croissants, muffins, or some breads. Dry pasta or rice meal packs. Vegetables Creamed or fried vegetables. Vegetables in a cheese sauce. Regular canned vegetables (not low-sodium or reduced-sodium). Regular canned tomato sauce and paste (not low-sodium or reduced-sodium). Regular tomato and vegetable juice (not low-sodium or reduced-sodium). Angie Fava. Olives. Fruits Canned fruit in a light or heavy syrup. Fried fruit. Fruit in cream or butter sauce. Meat and other protein foods Fatty cuts of meat. Ribs. Fried meat. Berniece Salines. Sausage. Bologna and other processed lunch meats. Salami. Fatback. Hotdogs. Bratwurst. Salted nuts and seeds. Canned beans with added salt. Canned or smoked fish. Whole eggs or egg yolks. Chicken or [place] with skin. Dairy Whole or 2% milk, cream, and half-and-half. Whole or full-fat cream cheese. Whole-fat or sweetened yogurt. Full-fat cheese. Nondairy creamers. Whipped toppings. Processed cheese and cheese spreads. Fats and oils Butter. Stick margarine. Lard. Shortening. Ghee. Bacon fat. Tropical oils, such as coconut, palm kernel, or palm oil. Seasoning and other foods Salted popcorn and pretzels. Onion salt, garlic salt, seasoned salt, table salt, and sea salt. Worcestershire sauce. Tartar sauce. Barbecue sauce. Teriyaki sauce. Soy sauce, including reduced-sodium. Steak sauce. Canned and packaged gravies. Fish sauce. Oyster sauce. Cocktail sauce. Horseradish that you find on the shelf. Ketchup. Mustard. Meat flavorings and tenderizers. Bouillon cubes. Hot sauce and Tabasco sauce. Premade or packaged marinades. Premade or packaged taco seasonings. Relishes. Regular salad dressings. Where to find more information:  National Heart, Lung, and [place]: https://wilson-eaton.com/  American Heart Association: www.heart.org Summary  The DASH eating plan is a healthy eating plan that has been shown to reduce high blood pressure (hypertension). It may also reduce your risk for type 2 diabetes, heart disease, and stroke.  With the DASH eating plan, you should limit salt (sodium) intake to 2,300 mg a day. If you have hypertension, you may need to reduce your sodium intake to 1,500 mg a day.  When on the DASH eating plan, aim to eat more fresh fruits and vegetables, whole grains, lean proteins, low-fat dairy, and heart-healthy fats.  Work with your health care provider or diet and nutrition specialist (dietitian) to adjust your eating plan to your individual calorie needs. This information is not intended to replace advice given to you by your health care provider. Make sure you discuss any questions you have with your health care provider. Document Revised: 03/25/2017 Document Reviewed: 04/05/2016 Elsevier Patient Education  2020 [employer].

## 2019-06-19 NOTE — Addendum Note (Signed)
Addended by: Brenton Grills on: [id number] AM   Modules accepted: Orders

## 2019-06-19 NOTE — Addendum Note (Signed)
Addended by: [name] on: 06/19/2019 01:50 PM   Modules accepted: Orders

## 2019-06-22 ENCOUNTER — Ambulatory Visit: Payer: Medicare Other | Admitting: Cardiothoracic Surgery

## 2019-06-25 ENCOUNTER — Ambulatory Visit: Payer: Medicare Other

## 2019-06-28 ENCOUNTER — Telehealth: Payer: Self-pay | Admitting: Family Medicine

## 2019-06-28 NOTE — Telephone Encounter (Signed)
Last time Furosemide was refilled was on 04/06/2019 with 0 refills. LOV 02/02/2019 for follow up.  No future office visits scheduled.

## 2019-07-19 ENCOUNTER — Other Ambulatory Visit: Payer: Self-pay | Admitting: Family Medicine

## 2019-07-20 ENCOUNTER — Other Ambulatory Visit: Payer: Self-pay

## 2019-07-20 ENCOUNTER — Ambulatory Visit: Payer: Medicare Other

## 2019-07-20 ENCOUNTER — Telehealth: Payer: Self-pay

## 2019-07-20 DIAGNOSIS — I10 Essential (primary) hypertension: Secondary | ICD-10-CM

## 2019-07-20 DIAGNOSIS — E1169 Type 2 diabetes mellitus with other specified complication: Secondary | ICD-10-CM

## 2019-07-20 DIAGNOSIS — I2510 Atherosclerotic heart disease of native coronary artery without angina pectoris: Secondary | ICD-10-CM

## 2019-07-20 DIAGNOSIS — I5022 Chronic systolic (congestive) heart failure: Secondary | ICD-10-CM

## 2019-07-20 DIAGNOSIS — E1165 Type 2 diabetes mellitus with hyperglycemia: Secondary | ICD-10-CM

## 2019-07-20 DIAGNOSIS — I4891 Unspecified atrial fibrillation: Secondary | ICD-10-CM

## 2019-07-20 DIAGNOSIS — IMO0002 Reserved for concepts with insufficient information to code with codable children: Secondary | ICD-10-CM

## 2019-07-20 DIAGNOSIS — E785 Hyperlipidemia, unspecified: Secondary | ICD-10-CM

## 2019-07-20 MED ORDER — GLIMEPIRIDE 2 MG PO TABS: 2.0000 mg | ORAL_TABLET | Freq: Every day | ORAL | 0 refills | Status: DC

## 2019-07-20 NOTE — Telephone Encounter (Signed)
E-scribed refill.  Plz schedule for wellness and lab visits.

## 2019-07-20 NOTE — Chronic Care Management (AMB) (Signed)
Chronic Care Management Pharmacy  Name: [name]  MRN: [id number] DOB: August 10, 1955  Chief Complaint/ HPI  [name],  64 y.o., male presents for their Follow-Up CCM visit with the clinical pharmacist via telephone.  PCP : [name], MD  Their chronic conditions include: atrial fibrillation/flutter, hypertension, coronary artery disease, heart failure, restrictive lung disease, chronic bronchitis, hyperlipidemia, type 2 diabetes, chronic pain, panic attacks, vitamin D deficiency  Patient concerns: denies concerns   Office Visits: none since last CCM visit on 06/18/19 - pt needs to schedule AWV and labs per pcp note on refill request  Allergies  Allergen Reactions  . Atorvastatin Other (See Comments)    Dizziness  . Lisinopril Cough   Medications: Outpatient Encounter Medications as of 07/20/2019  Medication Sig Note  . ACCU-CHEK FASTCLIX LANCETS MISC Check blood sugar once daily and as instructed. Dx 250.00   . albuterol (VENTOLIN HFA) 108 (90 Base) MCG/ACT inhaler Inhale 2 puffs into the lungs every 6 (six) hours as needed for wheezing or shortness of breath.   Marland Kitchen aspirin EC 81 MG tablet Take 81 mg by mouth daily.   . Blood Glucose Monitoring Suppl (BLOOD GLUCOSE MONITOR SYSTEM) W/DEVICE KIT by Does not apply route. Use to check sugar once daily and as needed Dx: E11.9 **ONE TOUCH VERIO**   . canagliflozin (INVOKANA) 100 MG TABS tablet Take 1 tablet (100 mg total) by mouth daily before breakfast. (Patient not taking: Reported on 06/20/2019)   . carvedilol (COREG) 6.25 MG tablet TAKE 1 TABLET BY MOUTH 2 TIMES DAILY WITH A MEAL.   Marland Kitchen Cholecalciferol (VITAMIN D) 50 MCG (2000 UT) CAPS Take 1 capsule (2,000 Units total) by mouth daily.   . citalopram (CELEXA) 10 MG tablet TAKE 1 TABLET BY MOUTH EVERY DAY   . Cyanocobalamin (B-12) 1000 MCG SUBL Place 1 tablet under the tongue daily. 06/20/2019: Not SL, takes tablet  . empagliflozin (JARDIANCE) 25 MG TABS tablet Take  25 mg by mouth daily before breakfast.   . ferrous sulfate 325 (65 FE) MG EC tablet Take 1 tablet (325 mg total) by mouth daily with breakfast. (Patient not taking: Reported on 06/20/2019)   . ferrous sulfate 325 (65 FE) MG tablet Take 650 mg by mouth daily with breakfast.   . fluticasone (FLONASE) 50 MCG/ACT nasal spray SPRAY 2 SPRAYS INTO EACH NOSTRIL EVERY DAY   . furosemide (LASIX) 20 MG tablet TAKE 1 TABLET (20 MG TOTAL) BY MOUTH 2 (TWO) TIMES DAILY AS NEEDED.   Marland Kitchen glimepiride (AMARYL) 2 MG tablet TAKE 1 TABLET (2 MG TOTAL) BY MOUTH DAILY WITH BREAKFAST.   . hydrOXYzine (ATARAX/VISTARIL) 50 MG tablet Take 1 tablet (50 mg total) by mouth 2 (two) times daily as needed for anxiety.   . metFORMIN (GLUCOPHAGE) 1000 MG tablet TAKE 1 TABLET (1,000 MG TOTAL) BY MOUTH 2 (TWO) TIMES DAILY WITH A MEAL.   . montelukast (SINGULAIR) 10 MG tablet TAKE 1 TABLET BY MOUTH EVERYDAY AT BEDTIME   . ONETOUCH VERIO test strip USE AS INSTRUCTED TO CHECK THREE TIMES DAILY AND AS NEEDED. E11.8   . pantoprazole (PROTONIX) 40 MG tablet TAKE 1 TABLET (40 MG TOTAL) BY MOUTH 2 (TWO) TIMES DAILY BEFORE A MEAL.   . Probiotic Product (PROBIOTIC DAILY PO) Take by mouth.   . simvastatin (ZOCOR) 20 MG tablet TAKE 1 TABLET BY MOUTH EVERY DAY IN THE EVENING    No facility-administered encounter medications on file as of 07/20/2019.   Current Diagnosis/Assessment: Goals    .  Pharmacy Care Plan     Current Barriers:  . Chronic Disease Management support, education, and care coordination needs related to atrial fibrillation/flutter, hypertension, coronary artery disease, heart failure, restrictive lung disease, chronic bronchitis, hyperlipidemia, type 2 diabetes, chronic pain, panic attacks, vitamin D deficiency  Pharmacist Clinical Goal(s):  Marland Kitchen Achieve control of blood sugars with goal A1c < 7%. Patient plans to increase walking, make dietary changes, and continue to monitor blood glucose daily. Restart glimepiride 2 mg daily with  breakfast.  . Reduce risk of stroke with atrial fibrillation. Pharmacist will coordinate with cardiology for less expensive anticoagulant. . Remain up to date on vaccinations. Recommend tetanus (Tdap) vaccine and shingles (Shingrix) vaccine. . Maintain cholesterol within goal of less than 70 mg/dL. Continue current therapy of simvastatin 20 mg once daily at bedtime. Current LDL: 86 mg/dL; Recommend switching to a higher potency statin such as rosuvastatin 20 mg or adding ezetimibe.   . Maintain allergy symptom control. Recommend safer alternative to diphenhydramine, such as cetirizine 10 mg daily. . Improve urinary frequency. Consult with PCP to reduce furosemide. . Assess efficacy of iron and vitamin D supplementation. Recommend repeat lab monitoring.   Interventions: . Comprehensive medication review performed.  Patient Self Care Activities:  . Self administers medications as prescribed . Self-monitors blood glucose daily  Please see past updates related to this goal by clicking on the "Past Updates" button in the selected goal     . weight management (pt-stated)     Starting 10/04/2016, I will continue to increase intake of fish and vegetables as well as decrease intake of sugar and bread in an effort to lose weight. Target weight is 250 lbs.       Diabetes   Recent Relevant Labs: Lab Results  Component Value Date/Time   HGBA1C 7.3 (A) 01/02/2019 09:03 AM   HGBA1C 7.5 (H) 09/19/2018 10:52 AM   HGBA1C 7.1 (H) 05/24/2018 10:34 AM   HGBA1C 7.0 (H) 01/24/2013 04:07 AM   MICROALBUR 1.0 10/17/2017 11:35 AM   MICROALBUR 0.9 10/04/2016 09:23 AM    Checking BG: 3-5 times a day FBG: 180-240 Before lunch: 170  Before dinner: 210 Bedtime: 170  Patient has failed these meds in past: insurance doesn't cover [place]  Patient is currently uncontrolled on the following medications:   Jardiance 25 mg - 1 tablet daily before breakfast   Glimepiride 2 mg - 1 tablet daily with breakfast  (not taking)  Metformin 1000 mg - 1 tablet twice daily with meals  Last diabetic eye exam:  Lab Results  Component Value Date/Time   HMDIABEYEEXA No Retinopathy 05/10/2017 12:00 AM    Last diabetic foot exam: recommend annual foot exam   We discussed: pt reports he has not been taking glimepiride, unsure how long, but thought it was replaced with Jardiance. Confirmed per PCP note it was to be continued and ordered pt refill today; pt has 10 tablets of Jardiance remaining and plans to d/c due to cost; confirmed that was okay as we will closely follow up. May need to increase glimepiride dose once restarted.  Diet: reports minimal dietary changes since last visit, avoids sweets and breads, purchased an air fryer  Exercise: none   Weight loss: reports reached goal of 10 lbs weight loss to current of 250 lbs, new goal is 240 lbs by [birth year].   Plan: Continue current medications; Restart glimepiride - 1 tablet every morning. Pt plans to discontinue Jardiance due to cost.   AFIB  Last cardiology visit 06/2018: Hilbert Odor, [place]; Dr. Nehemiah Massed  CHA2DS2-VASc score of 2 (CHF and diabetes mellitus). Have recommended anticoagulation for stroke risk reduction given CHA2DS2-VASc score. Discussed risks and benefits of anticoagulation. He is agreeable to the plan of care. Will prescribe Eliquis 5 mg BID. Since we are starting anticoagulation, will discontinue aspirin use to reduce bleeding risk.   Patient is currently rate controlled CHA2DS2-VASc score: 3 (HTN, CHF and diabetes mellitus) CPAP: wears occasionally, not daily, tried several different kinds of masks  Patient has tried these meds in past: warfarin, discontinued after ablation 2014 Patient is currently controlled on the following medications:   Carvedilol 6.25 mg - 1 tablet twice daily with meal  Aspirin 81 mg - 1 tablet daily   We discussed: Cardiologist started Eliquis 07/04/18, cost was too high through insurance,  discussed starting alternative but it seems no alternative was started; pt prefers not to see cardiology too often due to copay; Both Eliquis and Xarelto are tier 3 with his insurance. Pt is open to restarting warfarin, but is concerned due to anemia.   Plan: Continue current medications; Consult with PCP and cardiology regarding Eliquis or less expensive alternative initiation. Would like to try warfarin again if he can stop aspirin; would like to [facility].  Hyperlipidemia/CAD   Lipid Panel     Component Value Date/Time   CHOL 154 02/02/2019 0833   CHOL 203 (H) 01/24/2013 0407   TRIG 344.0 (H) 02/02/2019 0833   TRIG 355 (H) 01/24/2013 0407   HDL 39.00 (L) 02/02/2019 0833   HDL 34 (L) 01/24/2013 0407   CHOLHDL 4 02/02/2019 0833   VLDL 68.8 (H) 02/02/2019 0833   VLDL 71 (H) 01/24/2013 0407   LDLCALC 90 09/07/2013 1119   LDLCALC 98 01/24/2013 0407   LDLDIRECT 86.0 02/02/2019 0833   LDL goal < 70 (CAD) Patient has failed these meds in past: atorvastatin (dizziness) Patient is currently uncontrolled on the following medications:   Aspirin 81 mg - once daily  Simvastatin 20 mg - 1 tablet daily  We discussed: cardiac history, denies personal history of heart attack or stroke; started on atorvastatin 20 mg in 2014 per hospital discharge, did not tolerate.  Plan: Continue moderate intensity statin. May consider rosuvastatin 20 mg or adding on Zetia due to LDL > 70. Prioritizing other changes at this time.   Hypertension/CHF   Office blood pressures are: BP Readings from Last 3 Encounters:  02/08/19 (!) 147/91  02/02/19 132/70  01/02/19 132/76   CMP Latest Ref Rng & Units 02/02/2019 12/22/2018 09/19/2018  Glucose 70 - 99 mg/dL 176(H) 175(H) 139(H)  BUN 6 - 23 mg/dL 9 6(L) 8  Creatinine 0.40 - 1.50 mg/dL 0.65 0.64 0.63  Sodium 135 - 145 mEq/L 140 138 140  Potassium 3.5 - 5.1 mEq/L 5.0 3.9 4.2  Chloride 96 - 112 mEq/L 100 100 101  CO2 19 - 32 mEq/L 33(H)  30 32  Calcium 8.4 - 10.5 mg/dL 9.6 9.1 9.3  Total Protein 6.0 - 8.3 g/dL 6.5 7.0 6.6  Total Bilirubin 0.2 - 1.2 mg/dL 0.3 0.7 0.4  Alkaline Phos 39 - 117 U/L 60 63 63  AST 0 - 37 U/L '25 21 14  ' ALT 0 - 53 U/L '23 19 14   ' BP goal < 140/90 mmHg Patient has failed these meds in the past: none reported Patient checks BP at home: rarely Patient home BP readings are ranging: none reported  Patient is currently  controlled on the following medications:   Carvedilol 6.25 mg - 1 tablet twice daily with meal  Furosemide 20 mg - 1 tablet twice daily  We discussed: weight stable, weighs daily, denies swelling or SOB; pt would like to know if he could try furosemide once daily due to frequent urination; will consult with PCP   Plan: Continue current medications; Consult with PCP regarding Lasix dosing.     Vitamin D/Iron/B12 Deficiency  Vitamin B12: (02/02/19) 231 Vitamin D (02/02/19): 14 (low) Iron/TIBC/Ferritin/ %Sat    Component Value Date/Time   IRON 11 (L) 02/02/2019 0833   FERRITIN 6.3 (L) 02/02/2019 0833   IRONPCTSAT 2.2 (L) 02/02/2019 0833   CBC Latest Ref Rng & Units 02/02/2019 12/22/2018 09/19/2018  WBC 4.0 - 10.5 K/uL 9.2 8.2 10.1  Hemoglobin 13.0 - 17.0 g/dL 10.0(L) 10.0(L) 11.4(L)  Hematocrit 39.0 - 52.0 % 33.0(L) 34.6(L) 35.0(L)  Platelets 150.0 - 400.0 K/uL 294.0 233 244.0   Patient has failed these meds in past: none Patient is currently on the following medications (labs have not been updated since pt started supplements):   Vitamin D 2000 IU - 1 capsule daily (50 mcg)  Vitamin B12 1000 mcg - 1 tablet daily  Ferrous sulfate 325 mg - 2 tablet daily  Plan: Continue current medications; Recommend updating CBC, vitamin D and iron panel.   Medication Management:  OTCs: [employer], [employer] Allergy/Sinus (APAP 325 mg, Diphenhydramine 12.5 mg, Phenylephrine 5 mg - 1 tablet daily); recommend switching to 2nd gen. antihistamine, but pt declined  Pharmacy: [facility], convenient  location; check preferred pharmacies/ interested in delivery pending cost  Affordability: hydroxyzine and Jardiance are high, doesn't take hydroxyzine regularly so not concerned, plans to stop Jardiance for now, believe pt income too high for PAP  Vaccines: Due for Tdap and Shingrix  CCM Follow Up:  08/07/19 at 9:30 AM (telephone)  Debbora Dus, PharmD Clinical Pharmacist [facility] [phone]

## 2019-07-20 NOTE — Telephone Encounter (Signed)
Called patient and left voicemail for patient to call back to schedule CPE, AWV and labs.

## 2019-07-20 NOTE — Telephone Encounter (Signed)
Patient would like a refill on his glimepiride sent to [facility].  Thank you!  Debbora Dus, PharmD Clinical Pharmacist [facility] [phone]

## 2019-07-23 NOTE — Telephone Encounter (Signed)
Noted  

## 2019-07-25 ENCOUNTER — Telehealth: Payer: Self-pay

## 2019-07-25 NOTE — Telephone Encounter (Signed)
PCP Consult (see CCM note 07/20/19):  Diabetes: Checking BG: 3-5 times a day FBG: 180-240 Before lunch: 170  Before dinner: 210 Bedtime: 170  Current medications: Metformin 1000 mg BID, Jardiance 25 mg daily, Glimepiride 2 mg - daily   Pt has not been taking glimepiride for unknown period of time. Reports thought it was replaced with Jardiance. Asked him to restart as BG is high. Pt also requested to stop Jardiance due to cost. I plan to follow up with for BG log in 2 weeks. If still elevated at that time, would it be a good option to increase his glimepiride from 2 mg qAM to 4 mg qAM? He remains on metformin 1000 mg BID.  AFIB/Anticoagulation: Pt was started on Eliquis 5 mg BID 06/2018 per cardiology but cost was too high and seems he was lost to follow up. He is open to warfarin as he was on it previously but is concerned he may not need anticoagulation due to anemia. His CHA2DS2-VASc is 3 (HTN, CHF, DM). He would prefer it be managed by PCP and INR monitoring at [facility]. He is currently on aspirin 81 mg daily. I understand he is due for an AWV if you would like to wait and discuss then.  Frequent Urination: Pt is on furosemide 20 mg BID and interested in cutting back to once daily due to frequent urination. He denies SOB or swelling and weighs himself daily. Weight is stable.   Thank you for allowing me to be a part of his care.   Debbora Dus, PharmD Clinical Pharmacist [facility] at [facility] [phone]

## 2019-07-26 NOTE — Telephone Encounter (Signed)
Ok to decrease lasix to once daily. Ok to stop jardiance, increase amaryl to 4mg  with breakfast.  Looks like he never had colonoscopy.  Ok to start warfarin, would ideally want OV with me and colonoscopy prior to start.   Yes plz ensure he schedules f/u visit as overdue.

## 2019-08-07 ENCOUNTER — Other Ambulatory Visit: Payer: Self-pay

## 2019-08-07 ENCOUNTER — Telehealth: Payer: Self-pay

## 2019-08-07 ENCOUNTER — Ambulatory Visit: Payer: Medicare Other

## 2019-08-07 DIAGNOSIS — D509 Iron deficiency anemia, unspecified: Secondary | ICD-10-CM

## 2019-08-07 DIAGNOSIS — I4892 Unspecified atrial flutter: Secondary | ICD-10-CM

## 2019-08-07 DIAGNOSIS — E1169 Type 2 diabetes mellitus with other specified complication: Secondary | ICD-10-CM

## 2019-08-07 DIAGNOSIS — I4891 Unspecified atrial fibrillation: Secondary | ICD-10-CM

## 2019-08-07 DIAGNOSIS — IMO0002 Reserved for concepts with insufficient information to code with codable children: Secondary | ICD-10-CM

## 2019-08-07 DIAGNOSIS — E559 Vitamin D deficiency, unspecified: Secondary | ICD-10-CM

## 2019-08-07 DIAGNOSIS — I5022 Chronic systolic (congestive) heart failure: Secondary | ICD-10-CM

## 2019-08-07 DIAGNOSIS — E1165 Type 2 diabetes mellitus with hyperglycemia: Secondary | ICD-10-CM

## 2019-08-07 DIAGNOSIS — I10 Essential (primary) hypertension: Secondary | ICD-10-CM

## 2019-08-07 DIAGNOSIS — E785 Hyperlipidemia, unspecified: Secondary | ICD-10-CM

## 2019-08-07 NOTE — Telephone Encounter (Signed)
Patient agreed to schedule office visit with Dr. Danise Mina as he is past due. He would like a telephone visit due to caution with COVID-19. I informed him someone would be contacting him to schedule the appointment shortly.  Thanks!  Debbora Dus, PharmD Clinical Pharmacist [facility] at [facility] [phone]

## 2019-08-07 NOTE — Chronic Care Management (AMB) (Signed)
Chronic Care Management Pharmacy  Name: [name]  MRN: [id number] DOB: 09/21/1955  Chief Complaint/ HPI  [name],  64 y.o., male presents for their Follow-Up CCM visit with the clinical pharmacist via telephone.  PCP : [name], MD  Their chronic conditions include: atrial fibrillation/flutter, hypertension, coronary artery disease, heart failure, restrictive lung disease, chronic bronchitis, hyperlipidemia, type 2 diabetes, chronic pain, panic attacks, vitamin D deficiency  Patient concerns: denies concerns   Office Visits: none since last CCM visit on 07/20/19 --> consulted with PCP on 07/25/19 regarding the following changes:  Ok to decrease lasix to once daily due to frequent urination Ok to stop jardiance, increase amaryl to 21m with breakfast Looks like he never had colonoscopy Ok to start warfarin, would ideally want OV with me and colonoscopy prior to start.  Yes plz ensure he schedules f/u visit as overdue.   Allergies  Allergen Reactions  . Atorvastatin Other (See Comments)    Dizziness  . Lisinopril Cough   Medications: Outpatient Encounter Medications as of 08/07/2019  Medication Sig Note  . ACCU-CHEK FASTCLIX LANCETS MISC Check blood sugar once daily and as instructed. Dx 250.00   . albuterol (VENTOLIN HFA) 108 (90 Base) MCG/ACT inhaler Inhale 2 puffs into the lungs every 6 (six) hours as needed for wheezing or shortness of breath.   .Marland Kitchenaspirin EC 81 MG tablet Take 81 mg by mouth daily.   . Blood Glucose Monitoring Suppl (BLOOD GLUCOSE MONITOR SYSTEM) W/DEVICE KIT by Does not apply route. Use to check sugar once daily and as needed Dx: E11.9 **ONE TOUCH VERIO**   . canagliflozin (INVOKANA) 100 MG TABS tablet Take 1 tablet (100 mg total) by mouth daily before breakfast. (Patient not taking: Reported on 06/20/2019)   . carvedilol (COREG) 6.25 MG tablet TAKE 1 TABLET BY MOUTH 2 TIMES DAILY WITH A MEAL.   .Marland KitchenCholecalciferol (VITAMIN D) 50 MCG  (2000 UT) CAPS Take 1 capsule (2,000 Units total) by mouth daily.   . citalopram (CELEXA) 10 MG tablet TAKE 1 TABLET BY MOUTH EVERY DAY   . Cyanocobalamin (B-12) 1000 MCG SUBL Place 1 tablet under the tongue daily. 06/20/2019: Not SL, takes tablet  . empagliflozin (JARDIANCE) 25 MG TABS tablet Take 25 mg by mouth daily before breakfast.   . ferrous sulfate 325 (65 FE) MG EC tablet Take 1 tablet (325 mg total) by mouth daily with breakfast. (Patient not taking: Reported on 07/20/2019)   . ferrous sulfate 325 (65 FE) MG tablet Take 650 mg by mouth daily with breakfast.   . fluticasone (FLONASE) 50 MCG/ACT nasal spray SPRAY 2 SPRAYS INTO EACH NOSTRIL EVERY DAY   . furosemide (LASIX) 20 MG tablet TAKE 1 TABLET (20 MG TOTAL) BY MOUTH 2 (TWO) TIMES DAILY AS NEEDED.   .Marland Kitchenglimepiride (AMARYL) 2 MG tablet Take 1 tablet (2 mg total) by mouth daily with breakfast.   . hydrOXYzine (ATARAX/VISTARIL) 50 MG tablet Take 1 tablet (50 mg total) by mouth 2 (two) times daily as needed for anxiety.   . metFORMIN (GLUCOPHAGE) 1000 MG tablet TAKE 1 TABLET (1,000 MG TOTAL) BY MOUTH 2 (TWO) TIMES DAILY WITH A MEAL.   . montelukast (SINGULAIR) 10 MG tablet TAKE 1 TABLET BY MOUTH EVERYDAY AT BEDTIME   . ONETOUCH VERIO test strip USE AS INSTRUCTED TO CHECK THREE TIMES DAILY AND AS NEEDED. E11.8   . pantoprazole (PROTONIX) 40 MG tablet TAKE 1 TABLET (40 MG TOTAL) BY MOUTH 2 (TWO) TIMES DAILY  BEFORE A MEAL.   . Probiotic Product (PROBIOTIC DAILY PO) Take by mouth.   . simvastatin (ZOCOR) 20 MG tablet TAKE 1 TABLET BY MOUTH EVERY DAY IN THE EVENING    No facility-administered encounter medications on file as of 08/07/2019.   Current Diagnosis/Assessment: Goals    . Pharmacy Care Plan     Current Barriers:  . Chronic Disease Management support, education, and care coordination needs related to atrial fibrillation/flutter, hypertension, coronary artery disease, heart failure, restrictive lung disease, chronic bronchitis,  hyperlipidemia, type 2 diabetes, chronic pain, panic attacks, vitamin D deficiency  Pharmacist Clinical Goal(s):  Marland Kitchen Over the next 6 weeks, patient will work with PharmD and primary care provider to address the following goals: Marland Kitchen Diabetes: Achieve control of blood sugars with goal A1c < 7%. Patient plans to increase exercise with goal of 150 minutes per week. Continue to monitor blood glucose daily. Continue glimepiride 2 mg daily with breakfast and metformin 1000 mg twice daily with meals. Recommend annual comprehensive foot exam. . Atrial fibrillation: Reduce risk of stroke with atrial fibrillation. Discuss warfarin with Dr. Danise Mina at upcoming office visit.  . Vaccinations: Remain up to date on vaccinations. Recommend tetanus (Tdap) vaccine and shingles (Shingrix) vaccine. . Cholesterol: Maintain cholesterol within goal of less than 70 mg/dL. Continue current therapy of simvastatin 20 mg once daily at bedtime. Current LDL: 86 mg/dL; Recommend switching to a higher potency statin such as rosuvastatin 20 mg or adding ezetimibe.   . Allergies: Maintain allergy symptom control. Recommend safer alternative to diphenhydramine, such as cetirizine 10 mg daily. Marland Kitchen Heart failure: Improve urinary frequency. May reduce furosemide 20 mg to 1 tablet daily. Continue to check your weight daily and call if weight gain of more than 3 pounds overnight.  . Iron Deficiency: Assess efficacy of iron and vitamin D supplementation. Recommend repeat lab monitoring at next office visit.   Interventions: . Comprehensive medication review performed.  Patient Self Care Activities:  . Self administers medications as prescribed . Self-monitors blood glucose daily  Please see past updates related to this goal by clicking on the "Past Updates" button in the selected goal     . weight management (pt-stated)     Starting 10/04/2016, I will continue to increase intake of fish and vegetables as well as decrease intake of sugar  and bread in an effort to lose weight. Target weight is 250 lbs.       Diabetes   Recent Relevant Labs: Lab Results  Component Value Date/Time   HGBA1C 7.3 (A) 01/02/2019 09:03 AM   HGBA1C 7.5 (H) 09/19/2018 10:52 AM   HGBA1C 7.1 (H) 05/24/2018 10:34 AM   HGBA1C 7.0 (H) 01/24/2013 04:07 AM   MICROALBUR 1.0 10/17/2017 11:35 AM   MICROALBUR 0.9 10/04/2016 09:23 AM    Checking BG: 3-4 times a day Blood glucose range:127-160 (fasting, before meals and bedtime) - highest 240   Patient has failed these meds in past: insurance doesn't cover [place]  Patient is currently uncontrolled on the following medications:   Jardiance 25 mg - 1 tablet daily before breakfast (discontinued 07/26/19 due to cost)  Glimepiride 2 mg - 1 tablet daily with breakfast (restarted 07/26/19)  Metformin 1000 mg - 1 tablet twice daily with meals  Last diabetic eye exam:  Lab Results  Component Value Date/Time   HMDIABEYEEXA No Retinopathy 05/10/2017 12:00 AM    Last diabetic foot exam: recommend annual foot exam    Diet: reports minimal dietary changes since last visit,  avoids sweets and breads, purchased an air fryer  Exercise: none   Weight loss: reports reached goal of 10 lbs weight loss to current of 250 lbs, new goal is 240 lbs by [birth year].   Plan: Patient is doing much better since resuming glimepiride 2 mg daily. He would like to hold off on increasing to 4 mg daily, will discuss with PCP at office visit. Follow up in 1 month.   AFIB  Last cardiology visit 06/2018: Hilbert Odor, [place]; Dr. Nehemiah Massed  CHA2DS2-VASc score of 2 (CHF and diabetes mellitus). Have recommended anticoagulation for stroke risk reduction given CHA2DS2-VASc score. Discussed risks and benefits of anticoagulation. He is agreeable to the plan of care. Will prescribe Eliquis 5 mg BID. Since we are starting anticoagulation, will discontinue aspirin use to reduce bleeding risk.   Patient is currently rate  controlled CHA2DS2-VASc score: 3 (HTN, CHF and diabetes mellitus) CPAP: wears occasionally, not daily, tried several different kinds of masks  Patient has tried these meds in past: warfarin, discontinued after ablation 2014 Patient is currently controlled on the following medications:   Carvedilol 6.25 mg - 1 tablet twice daily with meal  Aspirin 81 mg - 1 tablet daily   Pertinent history: Cardiologist started Eliquis 07/04/18, patient did not start due to cost; discussed  warfarin with patient on 07/20/19 and he is agreeable to start, PCP would like to have office visit with patient prior to initiation of warfarin  Plan: Continue current medications; Schedule follow up with PCP to discuss warfarin and colonoscopy.   Hyperlipidemia/CAD   Lipid Panel     Component Value Date/Time   CHOL 154 02/02/2019 0833   CHOL 203 (H) 01/24/2013 0407   TRIG 344.0 (H) 02/02/2019 0833   TRIG 355 (H) 01/24/2013 0407   HDL 39.00 (L) 02/02/2019 0833   HDL 34 (L) 01/24/2013 0407   CHOLHDL 4 02/02/2019 0833   VLDL 68.8 (H) 02/02/2019 0833   VLDL 71 (H) 01/24/2013 0407   LDLCALC 90 09/07/2013 1119   LDLCALC 98 01/24/2013 0407   LDLDIRECT 86.0 02/02/2019 0833   LDL goal < 70 (CAD) Patient has failed these meds in past: atorvastatin (dizziness) Patient is currently uncontrolled on the following medications:   Aspirin 81 mg - once daily  Simvastatin 20 mg - 1 tablet daily  We discussed: cardiac history, denies personal history of heart attack or stroke; started on atorvastatin 20 mg in 2014 per hospital discharge, did not tolerate.  Plan: Continue moderate intensity statin. May consider rosuvastatin 20 mg or adding on Zetia due to LDL > 70. Prioritizing other changes at this time.   Hypertension/CHF   Office blood pressures are: BP Readings from Last 3 Encounters:  02/08/19 (!) 147/91  02/02/19 132/70  01/02/19 132/76   CMP Latest Ref Rng & Units 02/02/2019 12/22/2018 09/19/2018  Glucose 70 -  99 mg/dL 176(H) 175(H) 139(H)  BUN 6 - 23 mg/dL 9 6(L) 8  Creatinine 0.40 - 1.50 mg/dL 0.65 0.64 0.63  Sodium 135 - 145 mEq/L 140 138 140  Potassium 3.5 - 5.1 mEq/L 5.0 3.9 4.2  Chloride 96 - 112 mEq/L 100 100 101  CO2 19 - 32 mEq/L 33(H) 30 32  Calcium 8.4 - 10.5 mg/dL 9.6 9.1 9.3  Total Protein 6.0 - 8.3 g/dL 6.5 7.0 6.6  Total Bilirubin 0.2 - 1.2 mg/dL 0.3 0.7 0.4  Alkaline Phos 39 - 117 U/L 60 63 63  AST 0 - 37 U/L 25 21 14  ALT 0 - 53 U/L _0 BP goal < 140/90 mmHg Patient has failed these meds in the past: none reported Patient checks BP at home: rarely Patient home BP readings are ranging: none reported  Patient is currently controlled on the following medications:   Carvedilol 6.25 mg - 1 tablet twice daily with meal  Furosemide 20 mg - 1 tablet twice daily  We discussed: weight stable, weighs daily, denies swelling or SOB; informed patient of PCP approval to reduce furosemide to 1 tablet daily, call if changes in weight, SOB, or swelling   Plan: Reduce Lasix to 1 tablet daily per PCP consult to reduce frequent urination.    Vitamin D/Iron/B12 Deficiency  Vitamin B12: (02/02/19) 231 Vitamin D (02/02/19): 14 (low) Iron/TIBC/Ferritin/ %Sat    Component Value Date/Time   IRON 11 (L) 02/02/2019 0833   FERRITIN 6.3 (L) 02/02/2019 0833   IRONPCTSAT 2.2 (L) 02/02/2019 0833   CBC Latest Ref Rng & Units 02/02/2019 12/22/2018 09/19/2018  WBC 4.0 - 10.5 K/uL 9.2 8.2 10.1  Hemoglobin 13.0 - 17.0 g/dL 10.0(L) 10.0(L) 11.4(L)  Hematocrit 39.0 - 52.0 % 33.0(L) 34.6(L) 35.0(L)  Platelets 150.0 - 400.0 K/uL 294.0 233 244.0   Patient has failed these meds in past: none Patient is currently on the following medications (labs have not been updated since pt started supplements):   Vitamin D 2000 IU - 1 capsule daily (50 mcg)  Vitamin B12 1000 mcg - 1 tablet daily  Ferrous sulfate 325 mg - 2 tablet daily  Plan: Continue current medications; Recommend updating CBC, vitamin D  and iron panel. Patient agreed to schedule office visit.   Vaccines   Reviewed and discussed patient's vaccination history.    Immunization History  Administered Date(s) Administered  . Influenza,inj,Quad PF,6+ Mos 05/29/2014, 02/21/2015, 02/22/2017, 03/16/2018, 01/02/2019  . Influenza-Unspecified 01/24/2013  . Pneumococcal Polysaccharide-23 02/24/2013  . Td 04/26/2006   Plan: Recommended patient receive Tdap and Shingrix    Medication Management:  Pharmacy: [facility], convenient location; check preferred pharmacies/ interested in delivery pending cost  Affordability: denies concerns  CCM Follow Up:  09/18/19 at 8:30 AM (telephone)  Debbora Dus, PharmD Clinical Pharmacist [facility] at [facility] [phone] .

## 2019-08-07 NOTE — Patient Instructions (Signed)
August 07, 2019  Dear [name],  Below is a summary of the goals we discussed during our telephone appointment on August 07, 2019. Please contact me anytime with questions or concerns.   Visit Information  Goals Addressed            This Visit's Progress   . Pharmacy Care Plan       Current Barriers:  . Chronic Disease Management support, education, and care coordination needs related to atrial fibrillation/flutter, hypertension, coronary artery disease, heart failure, restrictive lung disease, chronic bronchitis, hyperlipidemia, type 2 diabetes, chronic pain, panic attacks, vitamin D deficiency  Pharmacist Clinical Goal(s):  Marland Kitchen Over the next 6 weeks, patient will work with PharmD and primary care provider to address the following goals: Marland Kitchen Diabetes: Achieve control of blood sugars with goal A1c < 7%. Patient plans to increase exercise with goal of 150 minutes per week. Continue to monitor blood glucose daily. Continue glimepiride 2 mg daily with breakfast and metformin 1000 mg twice daily with meals. Recommend annual comprehensive foot exam. . Atrial fibrillation: Reduce risk of stroke with atrial fibrillation. Discuss warfarin with Dr. Danise Mina at upcoming office visit.  . Vaccinations: Remain up to date on vaccinations. Recommend tetanus (Tdap) vaccine and shingles (Shingrix) vaccine. . Cholesterol: Maintain cholesterol within goal of less than 70 mg/dL. Continue current therapy of simvastatin 20 mg once daily at bedtime. Current LDL: 86 mg/dL; Recommend switching to a higher potency statin such as rosuvastatin 20 mg or adding ezetimibe.   . Allergies: Maintain allergy symptom control. Recommend safer alternative to diphenhydramine, such as cetirizine 10 mg daily. Marland Kitchen Heart failure: Improve urinary frequency. May reduce furosemide 20 mg to 1 tablet daily. Continue to check your weight daily and call if weight gain of more than 3 pounds overnight.  . Iron Deficiency: Assess efficacy  of iron and vitamin D supplementation. Recommend repeat lab monitoring at next office visit.   Interventions: . Comprehensive medication review performed.  Patient Self Care Activities:  . Self administers medications as prescribed . Self-monitors blood glucose daily  Please see past updates related to this goal by clicking on the "Past Updates" button in the selected goal       The patient verbalized understanding of instructions provided today and agreed to receive a mailed copy of patient instruction and/or educational materials.  Telephone follow up appointment with pharmacy team member scheduled for: 09/18/19 at 8:30 AM (telephone)  Debbora Dus, PharmD Clinical Pharmacist [facility] at [facility] [phone]

## 2019-08-10 NOTE — Progress Notes (Signed)
I have collaborated with the care management provider regarding care management and care coordination activities outlined in this encounter and have reviewed this encounter including documentation in the note and care plan. I am certifying that I agree with the content of this note and encounter as supervising physician.  

## 2019-08-21 ENCOUNTER — Telehealth: Payer: Medicare Other

## 2019-08-27 ENCOUNTER — Telehealth: Payer: Self-pay | Admitting: Family Medicine

## 2019-08-27 ENCOUNTER — Other Ambulatory Visit: Payer: Self-pay | Admitting: Family Medicine

## 2019-08-27 NOTE — Telephone Encounter (Signed)
Called patient and he's out of town right now but would rather call us once he gets back to schedule CPE and labs.

## 2019-09-14 ENCOUNTER — Other Ambulatory Visit: Payer: Self-pay | Admitting: Family Medicine

## 2019-09-14 NOTE — Telephone Encounter (Signed)
Looks like pt already has lab visit on 09/18/19.  Plz schedule wellness visits.  Then return encounter to me for refill.

## 2019-09-14 NOTE — Telephone Encounter (Signed)
Called patient and because of the pandemic he has not been leaving his house within the past year with anxiety about getting sick. I told him that we offer virtual visits but that I wasn't sure if Dr.G would do a virtual CPE and that I would ask, but I did make him aware that if we can do both the AWV and CPE virtual he would still need to come in for fasting labs.He would like one of you to reach out to him if the CPE can be done virtually or not. He said he has been staying nice and healthy while quarantining for the past year and that if he would need to come in office he wouldn't mind coming later in the year around the fall.

## 2019-09-18 ENCOUNTER — Ambulatory Visit: Payer: Medicare Other

## 2019-09-18 ENCOUNTER — Other Ambulatory Visit: Payer: Self-pay

## 2019-09-18 ENCOUNTER — Telehealth: Payer: Self-pay

## 2019-09-18 ENCOUNTER — Telehealth: Payer: Medicare Other

## 2019-09-18 DIAGNOSIS — E1169 Type 2 diabetes mellitus with other specified complication: Secondary | ICD-10-CM

## 2019-09-18 DIAGNOSIS — I5022 Chronic systolic (congestive) heart failure: Secondary | ICD-10-CM

## 2019-09-18 DIAGNOSIS — IMO0002 Reserved for concepts with insufficient information to code with codable children: Secondary | ICD-10-CM

## 2019-09-18 DIAGNOSIS — I4891 Unspecified atrial fibrillation: Secondary | ICD-10-CM

## 2019-09-18 NOTE — Patient Instructions (Addendum)
Sep 18, 2019  Dear [name],  Below is a summary of the goals we discussed during our telephone appointment on Sep 18, 2019. Please contact me anytime with questions or concerns.   Visit Information  Goals Addressed            This Visit's Progress   . Pharmacy Care Plan       Current Barriers:  . Chronic Disease Management support, education, and care coordination needs related to atrial fibrillation/flutter, hypertension, coronary artery disease, heart failure, restrictive lung disease, chronic bronchitis, hyperlipidemia, type 2 diabetes, chronic pain, panic attacks, vitamin D deficiency  Pharmacist Clinical Goal(s):  Over the next 4 weeks, patient will work with PharmD and primary care provider to address the following goals: Marland Kitchen Diabetes: Achieve control of blood sugars with goal A1c < 7%.  . Weight Loss: Achieve weight goal of 240 lbs. Current weight 262 lbs.  . Atrial fibrillation: Reduce risk of stroke with atrial fibrillation by starting appropriate therapy . Vaccinations: Remain up to date on vaccinations.  . Cholesterol: Improve cholesterol within goal of less than 70 mg/dL. Current LDL: 86 mg/dL . Allergies: Maintain allergy symptom control with safe OTC product use.  Marland Kitchen Heart failure: Prevent shortness of breath and fluid overload.   . Iron Deficiency: Assess efficacy of iron and vitamin D supplementation.   Interventions: . Comprehensive medication review performed. . Discussed diet and exercise goals for heart health/weight loss.  . Increase glimepiride 2 mg to 2 tablets daily with breakfast and continue metformin 1000 mg twice daily with meals.  . Recommend annual comprehensive foot exam and eye exam for Diabetes. . Recommend tetanus (Tdap) vaccine and shingles (Shingrix) vaccine. . Recommend starting warfarin therapy for atrial fibrillation pending labs/colonoscopy.  . Recommend switching to a higher potency statin such as rosuvastatin 20 mg or adding  ezetimibe pending lipid panel.  . Recommend repeat lab monitoring for iron and vitamin D.  Patient Self Care Activities:  . Increase glimepiride 2 mg to 2 tablets daily with breakfast  . Continue to monitor blood glucose twice daily  . Call if any hypoglycemia (BG < 70 mg/dL) . Work towards increasing physical activity with goal of 30 minutes, 5 days per week . Schedule lab appointment and virtual office visit with Dr. Darnell Level to discuss starting warfarin to prevent strokes . Continue to check your weight daily and call if weight gain of more than 3 pounds overnight . Switch diphenhydramine for allergies to safer alternative such as cetirizine 10 mg daily . Check with local pharmacy for shingles and tetanus vaccines  Please see past updates related to this goal by clicking on the "Past Updates" button in the selected goal       The patient verbalized understanding of instructions provided today and agreed to receive a mailed copy of patient instruction and/or educational materials.  Telephone follow up appointment with pharmacy team member scheduled for:  10/17/19 at 3:30 PM (telephone)  Debbora Dus, PharmD Clinical Pharmacist [facility] at [facility] [phone]

## 2019-09-18 NOTE — Progress Notes (Signed)
I have collaborated with the care management provider regarding care management and care coordination activities outlined in this encounter and have reviewed this encounter including documentation in the note and care plan. I am certifying that I agree with the content of this note and encounter as supervising physician.  

## 2019-09-18 NOTE — Chronic Care Management (AMB) (Signed)
Chronic Care Management Pharmacy  Name: [name]  MRN: [id number] DOB: 12-Dec-1955  Chief Complaint/ HPI  [name],  64 y.o., male presents for their Follow-Up CCM visit with the clinical pharmacist via telephone.  PCP : [name], MD  Their chronic conditions include: atrial fibrillation/flutter, hypertension, coronary artery disease, heart failure, restrictive lung disease, chronic bronchitis, hyperlipidemia, type 2 diabetes, chronic pain, panic attacks, vitamin D deficiency  Patient concerns: denies concerns, pt cancelled lab appt due to travel, but willing to reschedule this week; he did not schedule PCP appt as discussed at last visit despite offering virtual visit   Office Visits: none since last CCM visit on 08/07/19--> consulted with PCP on 07/25/19 regarding the following changes:  -Ok to decrease lasix to once daily due to frequent urination -Ok to stop jardiance, increase amaryl to 45m with breakfast -Looks like he never had colonoscopy - Ok to start warfarin, would ideally want OV with me and colonoscopy prior to start.  Yes plz ensure he schedules f/u visit as overdue.   Allergies  Allergen Reactions  . Atorvastatin Other (See Comments)    Dizziness  . Lisinopril Cough   Medications: Outpatient Encounter Medications as of 09/18/2019  Medication Sig Note  . aspirin EC 81 MG tablet Take 81 mg by mouth daily.   . carvedilol (COREG) 6.25 MG tablet TAKE 1 TABLET BY MOUTH 2 TIMES DAILY WITH A MEAL.   . citalopram (CELEXA) 10 MG tablet TAKE 1 TABLET BY MOUTH EVERY DAY   . ferrous sulfate 325 (65 FE) MG tablet Take 650 mg by mouth daily with breakfast.   . furosemide (LASIX) 20 MG tablet TAKE 1 TABLET (20 MG TOTAL) BY MOUTH 2 (TWO) TIMES DAILY AS NEEDED.   . hydrOXYzine (ATARAX/VISTARIL) 50 MG tablet Take 1 tablet (50 mg total) by mouth 2 (two) times daily as needed for anxiety.   . metFORMIN (GLUCOPHAGE) 1000 MG tablet TAKE 1 TABLET (1,000 MG TOTAL)  BY MOUTH 2 (TWO) TIMES DAILY WITH A MEAL.   . montelukast (SINGULAIR) 10 MG tablet TAKE 1 TABLET BY MOUTH EVERYDAY AT BEDTIME   . pantoprazole (PROTONIX) 40 MG tablet TAKE 1 TABLET (40 MG TOTAL) BY MOUTH 2 (TWO) TIMES DAILY BEFORE A MEAL.   . simvastatin (ZOCOR) 20 MG tablet TAKE 1 TABLET BY MOUTH EVERY DAY IN THE EVENING   . ACCU-CHEK FASTCLIX LANCETS MISC Check blood sugar once daily and as instructed. Dx 250.00   . albuterol (VENTOLIN HFA) 108 (90 Base) MCG/ACT inhaler Inhale 2 puffs into the lungs every 6 (six) hours as needed for wheezing or shortness of breath.   . Blood Glucose Monitoring Suppl (BLOOD GLUCOSE MONITOR SYSTEM) W/DEVICE KIT by Does not apply route. Use to check sugar once daily and as needed Dx: E11.9 **ONE TOUCH VERIO**   . canagliflozin (INVOKANA) 100 MG TABS tablet Take 1 tablet (100 mg total) by mouth daily before breakfast. (Patient not taking: Reported on 06/20/2019)   . Cholecalciferol (VITAMIN D) 50 MCG (2000 UT) CAPS Take 1 capsule (2,000 Units total) by mouth daily.   . Cyanocobalamin (B-12) 1000 MCG SUBL Place 1 tablet under the tongue daily. 06/20/2019: Not SL, takes tablet  . empagliflozin (JARDIANCE) 25 MG TABS tablet Take 25 mg by mouth daily before breakfast. (Patient not taking: Reported on 08/07/2019)   . ferrous sulfate 325 (65 FE) MG EC tablet Take 1 tablet (325 mg total) by mouth daily with breakfast. (Patient not taking: Reported on 08/07/2019)   .  fluticasone (FLONASE) 50 MCG/ACT nasal spray SPRAY 2 SPRAYS INTO EACH NOSTRIL EVERY DAY   . glimepiride (AMARYL) 2 MG tablet Take 1 tablet (2 mg total) by mouth daily with breakfast.   . ONETOUCH VERIO test strip USE AS INSTRUCTED TO CHECK THREE TIMES DAILY AND AS NEEDED. E11.8   . Probiotic Product (PROBIOTIC DAILY PO) Take by mouth.    No facility-administered encounter medications on file as of 09/18/2019.   Current Diagnosis/Assessment: Goals    . Pharmacy Care Plan     Current Barriers:  . Chronic Disease  Management support, education, and care coordination needs related to atrial fibrillation/flutter, hypertension, coronary artery disease, heart failure, restrictive lung disease, chronic bronchitis, hyperlipidemia, type 2 diabetes, chronic pain, panic attacks, vitamin D deficiency  Pharmacist Clinical Goal(s):  Over the next 4 weeks, patient will work with PharmD and primary care provider to address the following goals: Marland Kitchen Diabetes: Achieve control of blood sugars with goal A1c < 7%.  . Weight Loss: Achieve weight goal of 240 lbs. Current weight 262 lbs.  . Atrial fibrillation: Reduce risk of stroke with atrial fibrillation by starting appropriate therapy . Vaccinations: Remain up to date on vaccinations.  . Cholesterol: Improve cholesterol within goal of less than 70 mg/dL. Current LDL: 86 mg/dL . Allergies: Maintain allergy symptom control with safe OTC product use.  Marland Kitchen Heart failure: Prevent shortness of breath and fluid overload.   . Iron Deficiency: Assess efficacy of iron and vitamin D supplementation.   Interventions: . Comprehensive medication review performed. . Discussed diet and exercise goals for heart health/weight loss.  . Increase glimepiride 2 mg to 2 tablets daily with breakfast and continue metformin 1000 mg twice daily with meals.  . Recommend annual comprehensive foot exam and eye exam for Diabetes. . Recommend tetanus (Tdap) vaccine and shingles (Shingrix) vaccine. . Recommend starting warfarin therapy for atrial fibrillation pending labs/colonoscopy.  . Recommend switching to a higher potency statin such as rosuvastatin 20 mg or adding ezetimibe pending lipid panel.  . Recommend repeat lab monitoring for iron and vitamin D.  Patient Self Care Activities:  . Increase glimepiride 2 mg to 2 tablets daily with breakfast  . Continue to monitor blood glucose twice daily  . Call if any hypoglycemia (BG < 70 mg/dL) . Work towards increasing physical activity with goal of 30  minutes, 5 days per week . Schedule lab appointment and virtual office visit with Dr. Darnell Level to discuss starting warfarin to prevent strokes . Continue to check your weight daily and call if weight gain of more than 3 pounds overnight . Switch diphenhydramine for allergies to safer alternative such as cetirizine 10 mg daily . Check with local pharmacy for shingles and tetanus vaccines  Please see past updates related to this goal by clicking on the "Past Updates" button in the selected goal     . weight management (pt-stated)     Starting 10/04/2016, I will continue to increase intake of fish and vegetables as well as decrease intake of sugar and bread in an effort to lose weight. Target weight is 250 lbs.       Diabetes   Recent Relevant Labs: Lab Results  Component Value Date/Time   HGBA1C 7.3 (A) 01/02/2019 09:03 AM   HGBA1C 7.5 (H) 09/19/2018 10:52 AM   HGBA1C 7.1 (H) 05/24/2018 10:34 AM   HGBA1C 7.0 (H) 01/24/2013 04:07 AM   MICROALBUR 1.0 10/17/2017 11:35 AM   MICROALBUR 0.9 10/04/2016 09:23 AM    Checking  BG: 2x per day (time of day varies) Blood glucose range: Last 6 readings:160 (AM) - today, 167 bedtime, 181, 190, 272, 253, 205 (AM)  A1c < 7% Patient has failed these meds in past: cost - Jardiance, Farxiga  Patient is currently uncontrolled on the following medications:   Glimepiride 2 mg - 1 tablet daily with breakfast (restarted 07/26/19)  Metformin 1000 mg - 1 tablet twice daily with meals  Last diabetic eye exam:  Lab Results  Component Value Date/Time   HMDIABEYEEXA No Retinopathy 05/10/2017 12:00 AM    Last diabetic foot exam: recommend annual foot exam   Diet: reports poor diet since last visit - eating big macs lately, went on vacation and ate poorly  Exercise: none except yard work  Weight loss: on 08/07/19 pt reported weight of 250 lbs, he is up today to 262 lbs (09/18/19)  Plan: Increase glimepiride to 2 tablets (4 mg) every morning. Continue to monitor BG  twice daily. Work towards weight loss goals and resuming healthy diet.   AFIB  Last cardiology visit 06/2018: Hilbert Odor, PA; Dr. Nehemiah Massed  CHA2DS2-VASc score of 2 (CHF and diabetes mellitus). Have recommended anticoagulation for stroke risk reduction given CHA2DS2-VASc score. Discussed risks and benefits of anticoagulation. He is agreeable to the plan of care. Will prescribe Eliquis 5 mg BID. Since we are starting anticoagulation, will discontinue aspirin use to reduce bleeding risk.   Patient is currently rate controlled CHA2DS2-VASc score: 3 (HTN, CHF and diabetes mellitus) CPAP: wears occasionally, not daily, tried several different kinds of masks  Patient has tried these meds in past: warfarin, discontinued after ablation 2014 Patient is currently controlled on the following medications:   Carvedilol 6.25 mg - 1 tablet twice daily with meal  Aspirin 81 mg - 1 tablet daily   Pertinent history: Cardiologist started Eliquis 07/04/18, patient did not start due to cost; discussed warfarin with patient on 07/20/19 and he is agreeable to start, PCP would like to have office visit with patient prior to initiation of warfarin  Plan: Continue current medications; Continued to encourage scheduling follow up with PCP to discuss warfarin and colonoscopy.   Hyperlipidemia/CAD   Lipid Panel     Component Value Date/Time   CHOL 154 02/02/2019 0833   CHOL 203 (H) 01/24/2013 0407   TRIG 344.0 (H) 02/02/2019 0833   TRIG 355 (H) 01/24/2013 0407   HDL 39.00 (L) 02/02/2019 0833   HDL 34 (L) 01/24/2013 0407   CHOLHDL 4 02/02/2019 0833   VLDL 68.8 (H) 02/02/2019 0833   VLDL 71 (H) 01/24/2013 0407   LDLCALC 90 09/07/2013 1119   LDLCALC 98 01/24/2013 0407   LDLDIRECT 86.0 02/02/2019 0833   LDL goal < 70 (CAD) Patient has failed these meds in past: atorvastatin (dizziness) Patient is currently uncontrolled on the following medications:   Aspirin 81 mg - once daily  Simvastatin 20 mg - 1  tablet daily  We discussed: cardiac history, denies personal history of heart attack or stroke; started on atorvastatin 20 mg in 2014 per hospital discharge, did not tolerate.  Plan: Continue moderate intensity statin. Repeat lipid panel this month then reassess.   Hypertension/CHF   Office blood pressures are: BP Readings from Last 3 Encounters:  02/08/19 (!) 147/91  02/02/19 132/70  01/02/19 132/76   CMP Latest Ref Rng & Units 02/02/2019 12/22/2018 09/19/2018  Glucose 70 - 99 mg/dL 176(H) 175(H) 139(H)  BUN 6 - 23 mg/dL 9 6(L) 8  Creatinine 0.40 - 1.50 mg/dL  0.65 0.64 0.63  Sodium 135 - 145 mEq/L 140 138 140  Potassium 3.5 - 5.1 mEq/L 5.0 3.9 4.2  Chloride 96 - 112 mEq/L 100 100 101  CO2 19 - 32 mEq/L 33(H) 30 32  Calcium 8.4 - 10.5 mg/dL 9.6 9.1 9.3  Total Protein 6.0 - 8.3 g/dL 6.5 7.0 6.6  Total Bilirubin 0.2 - 1.2 mg/dL 0.3 0.7 0.4  Alkaline Phos 39 - 117 U/L 60 63 63  AST 0 - 37 U/L '25 21 14  ' ALT 0 - 53 U/L '23 19 14   ' BP goal < 140/90 mmHg Patient has failed these meds in the past: none reported Patient checks BP at home: rarely Patient home BP readings are ranging: none reported  Patient is currently controlled on the following medications:   Carvedilol 6.25 mg - 1 tablet twice daily with meal  Furosemide 20 mg - 1 tablet twice daily (pt never decreased to once daily as discussed at last visit on 08/07/19)  We discussed: weight stable, weighs daily, denies swelling or SOB - pt now prefers to stay on furosemide BID due to concern about his legs swelling when doing yard work  Plan: Continue current medications    Vitamin D/Iron/B12 Deficiency  Vitamin B12: (02/02/19) 231 Vitamin D (02/02/19): 14 (low) Iron/TIBC/Ferritin/ %Sat    Component Value Date/Time   IRON 11 (L) 02/02/2019 0833   FERRITIN 6.3 (L) 02/02/2019 0833   IRONPCTSAT 2.2 (L) 02/02/2019 0833   CBC Latest Ref Rng & Units 02/02/2019 12/22/2018 09/19/2018  WBC 4.0 - 10.5 K/uL 9.2 8.2 10.1  Hemoglobin  13.0 - 17.0 g/dL 10.0(L) 10.0(L) 11.4(L)  Hematocrit 39.0 - 52.0 % 33.0(L) 34.6(L) 35.0(L)  Platelets 150.0 - 400.0 K/uL 294.0 233 244.0   Patient has failed these meds in past: none Patient is currently on the following medications (labs have not been updated since pt started supplements):   Vitamin D 2000 IU - 1 capsule daily (50 mcg)  Vitamin B12 1000 mcg - 1 tablet daily  Ferrous sulfate 325 mg - 2 tablet daily  Plan: Continue current medications; Updating CBC, vitamin D and iron panel with upcoming lab appt.   Vaccines   Reviewed and discussed patient's vaccination history.    Immunization History  Administered Date(s) Administered  . Influenza,inj,Quad PF,6+ Mos 05/29/2014, 02/21/2015, 02/22/2017, 03/16/2018, 01/02/2019  . Influenza-Unspecified 01/24/2013  . Pneumococcal Polysaccharide-23 02/24/2013  . Td 04/26/2006   Plan: Recommended patient receive Tdap and Shingrix    Medication Management:  Pharmacy: [facility]  Affordability: denies concerns  CCM Follow Up:  10/17/19 at 3:30 PM (telephone)  Debbora Dus, PharmD Clinical Pharmacist [facility] at [facility] [phone] .

## 2019-09-18 NOTE — Telephone Encounter (Signed)
Hi! When I talked to patient a few days ago to get him seen he stated that he is not interested to come in the office any time soon with the pandemic going on. He stated that he will do any virtual visits but will not schedule any appointments that involves him to come in office anytime soon.

## 2019-09-18 NOTE — Telephone Encounter (Signed)
Please call patient to schedule lab appointment, thanks!  Debbora Dus, PharmD Clinical Pharmacist [facility] at [facility] [phone]

## 2019-09-18 NOTE — Telephone Encounter (Addendum)
Please schedule AMW video with wellness nurse as insurance will cover this.  Please offer virtual follow up visit at his convenience, 30 min appt.  Please schedule in-person CPE in 6 months.

## 2019-09-19 NOTE — Telephone Encounter (Signed)
I spoke to the patient yesterday and he stated he was comfortable coming for lab work only and would like a call for scheduling. Sorry for the trouble!  Sharyn Lull

## 2019-09-20 NOTE — Telephone Encounter (Signed)
Called patient and left voicemail for patient to call our office to schedule AWV phone visit, 30 minute office visit and labs at convince.

## 2019-09-20 NOTE — Telephone Encounter (Signed)
Called patient and left voicemail for him to call office to schedule appointments at his earliest convenience.

## 2019-09-21 ENCOUNTER — Other Ambulatory Visit: Payer: Self-pay | Admitting: Family Medicine

## 2019-10-02 ENCOUNTER — Telehealth: Payer: Self-pay

## 2019-10-02 ENCOUNTER — Encounter: Payer: Self-pay | Admitting: Family Medicine

## 2019-10-02 ENCOUNTER — Other Ambulatory Visit: Payer: Self-pay

## 2019-10-02 ENCOUNTER — Ambulatory Visit (INDEPENDENT_AMBULATORY_CARE_PROVIDER_SITE_OTHER)
Admission: RE | Admit: 2019-10-02 | Discharge: 2019-10-02 | Disposition: A | Discharge status: Home / Self Care | Payer: Medicare Other | Source: Ambulatory Visit | Attending: Family Medicine | Admitting: Family Medicine

## 2019-10-02 ENCOUNTER — Ambulatory Visit (INDEPENDENT_AMBULATORY_CARE_PROVIDER_SITE_OTHER): Payer: Medicare Other | Admitting: Family Medicine

## 2019-10-02 ENCOUNTER — Other Ambulatory Visit (INDEPENDENT_AMBULATORY_CARE_PROVIDER_SITE_OTHER): Payer: Medicare Other

## 2019-10-02 VITALS — BP 136/82 | HR 70 | Temp 98.2°F | Ht 70.0 in | Wt 281.3 lb

## 2019-10-02 DIAGNOSIS — R079 Chest pain, unspecified: Secondary | ICD-10-CM | POA: Diagnosis not present

## 2019-10-02 DIAGNOSIS — IMO0002 Reserved for concepts with insufficient information to code with codable children: Secondary | ICD-10-CM

## 2019-10-02 DIAGNOSIS — D509 Iron deficiency anemia, unspecified: Secondary | ICD-10-CM

## 2019-10-02 DIAGNOSIS — R1013 Epigastric pain: Secondary | ICD-10-CM

## 2019-10-02 DIAGNOSIS — E538 Deficiency of other specified B group vitamins: Secondary | ICD-10-CM | POA: Diagnosis not present

## 2019-10-02 DIAGNOSIS — G4731 Primary central sleep apnea: Secondary | ICD-10-CM

## 2019-10-02 DIAGNOSIS — E559 Vitamin D deficiency, unspecified: Secondary | ICD-10-CM | POA: Diagnosis not present

## 2019-10-02 DIAGNOSIS — I4891 Unspecified atrial fibrillation: Secondary | ICD-10-CM

## 2019-10-02 DIAGNOSIS — E1165 Type 2 diabetes mellitus with hyperglycemia: Secondary | ICD-10-CM

## 2019-10-02 DIAGNOSIS — R0601 Orthopnea: Secondary | ICD-10-CM

## 2019-10-02 DIAGNOSIS — R0602 Shortness of breath: Secondary | ICD-10-CM | POA: Diagnosis not present

## 2019-10-02 DIAGNOSIS — I509 Heart failure, unspecified: Secondary | ICD-10-CM | POA: Diagnosis not present

## 2019-10-02 DIAGNOSIS — I4892 Unspecified atrial flutter: Secondary | ICD-10-CM

## 2019-10-02 DIAGNOSIS — R140 Abdominal distension (gaseous): Secondary | ICD-10-CM | POA: Diagnosis not present

## 2019-10-02 DIAGNOSIS — K429 Umbilical hernia without obstruction or gangrene: Secondary | ICD-10-CM

## 2019-10-02 DIAGNOSIS — E118 Type 2 diabetes mellitus with unspecified complications: Secondary | ICD-10-CM

## 2019-10-02 DIAGNOSIS — D649 Anemia, unspecified: Secondary | ICD-10-CM

## 2019-10-02 LAB — COMPREHENSIVE METABOLIC PANEL
ALT: 23 U/L (ref 0–53)
AST: 27 U/L (ref 0–37)
Albumin: 4.3 g/dL (ref 3.5–5.2)
Alkaline Phosphatase: 59 U/L (ref 39–117)
BUN: 6 mg/dL (ref 6–23)
CO2: 35 mEq/L — ABNORMAL HIGH (ref 19–32)
Calcium: 9.4 mg/dL (ref 8.4–10.5)
Chloride: 98 mEq/L (ref 96–112)
Creatinine, Ser: 0.62 mg/dL (ref 0.40–1.50)
GFR: 130.59 mL/min (ref >60.00)
Glucose, Bld: 137 mg/dL — ABNORMAL HIGH (ref 70–99)
Potassium: 4.9 mEq/L (ref 3.5–5.1)
Sodium: 138 mEq/L (ref 135–145)
Total Bilirubin: 0.3 mg/dL (ref 0.2–1.2)
Total Protein: 6.9 g/dL (ref 6.0–8.3)

## 2019-10-02 LAB — CBC WITH DIFFERENTIAL/PLATELET
Basophils Absolute: 0 10*3/uL (ref 0.0–0.1)
Basophils Relative: 0.4 % (ref 0.0–3.0)
Eosinophils Absolute: 0.1 10*3/uL (ref 0.0–0.7)
Eosinophils Relative: 1.6 % (ref 0.0–5.0)
HCT: 39.5 % (ref 39.0–52.0)
Hemoglobin: 12.7 g/dL — ABNORMAL LOW (ref 13.0–17.0)
Lymphocytes Relative: 25.1 % (ref 12.0–46.0)
Lymphs Abs: 2.2 10*3/uL (ref 0.7–4.0)
MCHC: 32.2 g/dL (ref 30.0–36.0)
MCV: 82.9 fl (ref 78.0–100.0)
Monocytes Absolute: 0.8 10*3/uL (ref 0.1–1.0)
Monocytes Relative: 9.5 % (ref 3.0–12.0)
Neutro Abs: 5.6 10*3/uL (ref 1.4–7.7)
Neutrophils Relative %: 63.4 % (ref 43.0–77.0)
Platelets: 222 10*3/uL (ref 150.0–400.0)
RBC: 4.76 Mil/uL (ref 4.22–5.81)
RDW: 14.8 % (ref 11.5–15.5)
WBC: 8.8 10*3/uL (ref 4.0–10.5)

## 2019-10-02 LAB — BRAIN NATRIURETIC PEPTIDE: Pro B Natriuretic peptide (BNP): 45 pg/mL (ref 0.0–100.0)

## 2019-10-02 LAB — IBC + FERRITIN
Ferritin: 11.2 ng/mL — ABNORMAL LOW (ref 22.0–322.0)
Iron: 74 ug/dL (ref 42–165)
Saturation Ratios: 15.3 % — ABNORMAL LOW (ref 20.0–50.0)
Transferrin: 345 mg/dL (ref 212.0–360.0)

## 2019-10-02 LAB — TSH: TSH: 2.63 u[IU]/mL (ref 0.35–4.50)

## 2019-10-02 LAB — VITAMIN B12: Vitamin B-12: 510 pg/mL (ref 211–911)

## 2019-10-02 LAB — VITAMIN D 25 HYDROXY (VIT D DEFICIENCY, FRACTURES): VITD: 27.47 ng/mL — ABNORMAL LOW (ref 30.00–100.00)

## 2019-10-02 LAB — HEMOGLOBIN A1C: Hgb A1c MFr Bld: 8.6 % — ABNORMAL HIGH (ref 4.6–6.5)

## 2019-10-02 MED ORDER — GLIMEPIRIDE 4 MG PO TABS: 4.0000 mg | ORAL_TABLET | Freq: Every day | ORAL | 3 refills | Status: DC

## 2019-10-02 MED ORDER — ALBUTEROL SULFATE (2.5 MG/3ML) 0.083% IN NEBU
2.5000 mg | INHALATION_SOLUTION | Freq: Four times a day (QID) | RESPIRATORY_TRACT | 1 refills | Status: DC | PRN
Start: 2019-10-02 — End: 2020-08-19

## 2019-10-02 MED ORDER — SUCRALFATE 1 G PO TABS
1.0000 g | ORAL_TABLET | Freq: Three times a day (TID) | ORAL | 0 refills | Status: DC
Start: 2019-10-02 — End: 2020-01-05

## 2019-10-02 NOTE — Assessment & Plan Note (Signed)
Update levels on replacement

## 2019-10-02 NOTE — Telephone Encounter (Signed)
[facility] - Client TELEPHONE ADVICE RECORD [facility] Patient Name: [name] [facility] Gender: Male DOB: [birth year] Age: 64 Y 18 D Return Phone Number: [phone] (Primary) Address: City/State/Zip: [place] [address] Client [facility] - Client Client Site [place] - Day Physician [name] - MD Contact Type Call Who Is Calling Patient / Member / Family / Caregiver Call Type Triage / Clinical Caller Name Seth Bake. Relationship To Patient Other Return Phone Number [phone] (Primary) Chief Complaint BREATHING - shortness of breath or sounds breathless Reason for Call Symptomatic / Request for [place] states, patient having SOB X3 days. Translation No Nurse Assessment Nurse: Ysidro Evert, RN, Levada Dy Date/Time (Eastern Time): 10/02/2019 9:20:28 AM Confirm and document reason for call. If symptomatic, describe symptoms. ---Caller states he is having shortness of breath for the last 3 days. No fever Has the patient had close contact with a person known or suspected to have the novel coronavirus illness OR traveled / lives in area with major community spread (including international travel) in the last 14 days from the onset of symptoms? * If Asymptomatic, screen for exposure and travel within the last 14 days. ---No Does the patient have any new or worsening symptoms? ---Yes Will a triage be completed? ---Yes Related visit to physician within the last 2 weeks? ---No Does the PT have any chronic conditions? (i.e. diabetes, asthma, this includes High risk factors for pregnancy, etc.) ---Yes List chronic conditions. ---diabetes, copd Is this a behavioral health or substance abuse call? ---No Guidelines Guideline Title Affirmed Question Affirmed Notes Nurse Date/Time (Eastern Time) Breathing Difficulty [1] MODERATE difficulty breathing (e.g., speaks in  phrases, SOB even at rest, pulse 100-120) AND [2] NEWonset or WORSE than normal Earleen Reaper 10/02/2019 9:21:49 AMPLEASE NOTE: All timestamps contained within this report are represented as [place] Standard Time. CONFIDENTIALTY NOTICE: This fax transmission is intended only for the addressee. It contains information that is legally privileged, confidential or otherwise protected from use or disclosure. If you are not the intended recipient, you are strictly prohibited from reviewing, disclosing, copying using or disseminating any of this information or taking any action in reliance on or regarding this information. If you have received this fax in error, please notify us immediately by telephone so that we can arrange for its return to [place]. Phone: [phone], Toll-Free: [phone], Fax: [phone] Page: 2 of 2 Call Id: [id number] [place]. Time Eilene Ghazi Time) Disposition Final User 10/02/2019 9:18:11 AM Send to Urgent Queue Jobie Quaker 10/02/2019 9:24:40 AM Go to ED Now Yes Ysidro Evert, RN, [place] Disagree/Comply Disagree Caller Understands Yes PreDisposition Did not know what to do Care Advice Given Per Guideline GO TO ED NOW: * You need to be seen in the Emergency Department. * Go to the ED at ___________ [place] now. Drive carefully. * Another adult should drive. BRING MEDICINES: * Please bring a list of your current medicines when you go to the Emergency Department (ER). * It is also a good idea to bring the pill bottles too. This will help the doctor to make certain you are taking the right medicines and the right dose. CARE ADVICE given per Breathing Difficulty (Adult) guideline. CALL EMS 911 IF: * Call EMS if you become worse. Comments User: Herbert Pun, RN Date/Time Eilene Ghazi Time): 10/02/2019 9:25:40 AM I warm transferred the caller to the office. He refused ER treatment outcome and states he "just wants to see the  doctor" Referrals GO TO FACILITY REFUSED

## 2019-10-02 NOTE — Assessment & Plan Note (Signed)
Currently off invokana and jardiance due to cost, only on metformin and amaryl 4mg  daily with breakfast. Update A1c today.

## 2019-10-02 NOTE — Assessment & Plan Note (Signed)
Appreciate pulm care.  ?

## 2019-10-02 NOTE — Telephone Encounter (Signed)
Seen today. 

## 2019-10-02 NOTE — Assessment & Plan Note (Addendum)
Anticipate component of this leading to dyspnea orthopnea and cough symptoms. He does endorse good UOP with lasix 20mg  BID. Will increase lasix to 40mg /20mg  for 3 days and have him update [place] with effect.  I also asked him to call [facility] cardiology Nehemiah Massed) for f/u (last seen 06/2018).

## 2019-10-02 NOTE — Assessment & Plan Note (Signed)
S/p ablation. Currently off anticoagulation, only on aspirin 81mg  daily. Update EKG today, encouraged f/u with Dr Nehemiah Massed.

## 2019-10-02 NOTE — Telephone Encounter (Signed)
Pt c/o of SOB due to gas in his chest for 3 days. No chest pain, fever, radiating pain or dizziness. Pt reports he has a hx of gas and reflux and it goes into his chest and causes SOB. Pt reports he has been seen in the past for this.  Pt requested apt for today. When call was transferred to Access Nurse there protocol told him to go to the ER. Pt refused to go do to no transportation and having no one to take care of his animals.  Made apt for pt for today with PCP. Advised if any symptoms worsen to contact office. Pt verbalized understanding.

## 2019-10-02 NOTE — Progress Notes (Signed)
This visit was conducted in person.  BP 136/82 (BP Location: Left Arm, Patient Position: Sitting, Cuff Size: Large)   Pulse 70   Temp 98.2 F (36.8 C) (Temporal)   Ht _0  (1.778 m)   Wt 281 lb 5 oz (127.6 kg)   SpO2 94%   BMI 40.36 kg/m    CC: chest and epigastric pain, dyspnea Subjective:    Patient ID: [name], male    DOB: 29-Mar-1956, 64 y.o.   MRN: [id number]  HPI: [name] is a 64 y.o. male presenting on 10/02/2019 for Chest Pain (C/o chest pain, SOB, abd gurgling and gas in chest.  Causing anxiety/panic attacks. Started about 3 days ago.  Tried Gas-X and Peptobismol, not helpful.  Requests an EKG.  )   Last seen 01/2019. Overdue for f/u.  COVID vaccine - completed [place] 07/2019. Has been staying safe.   3 d h/o gassiness, abdominal bloating, with radiation to chest associated with dyspnea. This has led to increased anxiety with anxiety attacks. He notes orthopnea and denies leg swelling. Dry cough associated with mucous/congestion in lungs. Feels intermittent palpitations without dizziness. Mild nausea. Does note PNDrainage despite flonase and singulair which he has recently restarted - notes recurrent R maxillary pressure pain.   Denies fevers/chills, dysphagia, headache, vomiting, diarrhea, constipation, early satiety, weight changes or night sweats.   Pepto bismol, gas X didn't help. Started mucinex yesterday with some improvement to chest congestion.  Denies diet changes prior to above symptoms.  Significant coffee intake - 2 pots a day  He is taking protonix 30m bid. He also takes daily probiotic.  Continues lasix 258mbid.  Asks about nebulizer. He takes albuterol inhaler but doesn't feel any benefit from it. Doesn't have nebulizer machine at home.   H/o afib/aflutter - has felt well since ablation 14 yrs ago.   DM - on metformin 100047mid and glimepiride 4mg67mily with breakfast. cbg last checked 156 this morning, last night 138. Checks  daily. No longer on jardiance or invokana.  Lab Results  Component Value Date   HGBA1C 7.3 (A) 01/02/2019     Overdue for colonoscopy as well as EGD. Encouraged he call to schedule GI f/u.      Relevant past medical, surgical, family and social history reviewed and updated as indicated. Interim medical history since our last visit reviewed. Allergies and medications reviewed and updated. Outpatient Medications Prior to Visit  Medication Sig Dispense Refill  . ACCU-CHEK FASTCLIX LANCETS MISC Check blood sugar once daily and as instructed. Dx 250.00 100 each 3  . albuterol (VENTOLIN HFA) 108 (90 Base) MCG/ACT inhaler Inhale 2 puffs into the lungs every 6 (six) hours as needed for wheezing or shortness of breath. 18 g 1  . aspirin EC 81 MG tablet Take 81 mg by mouth daily.    . Blood Glucose Monitoring Suppl (BLOOD GLUCOSE MONITOR SYSTEM) W/DEVICE KIT by Does not apply route. 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 carvedilol (COREG) 6.25 MG tablet TAKE 1 TABLET BY MOUTH 2 TIMES DAILY WITH A MEAL. 180 tablet 0  . Cholecalciferol (VITAMIN D) 50 MCG (2000 UT) CAPS Take 1 capsule (2,000 Units total) by mouth daily. 30 capsule   . citalopram (CELEXA) 10 MG tablet TAKE 1 TABLET BY MOUTH EVERY DAY 90 tablet 2  . Cyanocobalamin (B-12) 1000 MCG SUBL Place 1 tablet under the tongue daily.    .Marland Kitchen  ferrous sulfate 325 (65 FE) MG tablet Take 650 mg by mouth daily with breakfast.    . fluticasone (FLONASE) 50 MCG/ACT nasal spray SPRAY 2 SPRAYS INTO EACH NOSTRIL EVERY DAY (Patient taking differently: As needed) 16 mL 3  . furosemide (LASIX) 20 MG tablet TAKE 1 TABLET (20 MG TOTAL) BY MOUTH 2 (TWO) TIMES DAILY AS NEEDED. 180 tablet 0  . hydrOXYzine (ATARAX/VISTARIL) 50 MG tablet Take 1 tablet (50 mg total) by mouth 2 (two) times daily as needed for anxiety. 180 tablet 1  . metFORMIN (GLUCOPHAGE) 1000 MG tablet TAKE 1 TABLET (1,000 MG TOTAL) BY MOUTH 2 (TWO) TIMES DAILY WITH  A MEAL. 180 tablet 2  . montelukast (SINGULAIR) 10 MG tablet TAKE 1 TABLET BY MOUTH EVERYDAY AT BEDTIME 90 tablet 0  . ONETOUCH VERIO test strip USE AS INSTRUCTED TO CHECK THREE TIMES DAILY AND AS NEEDED. E11.8 300 strip 3  . pantoprazole (PROTONIX) 40 MG tablet TAKE 1 TABLET (40 MG TOTAL) BY MOUTH 2 (TWO) TIMES DAILY BEFORE A MEAL. 180 tablet 0  . Probiotic Product (PROBIOTIC DAILY PO) Take by mouth.    . simvastatin (ZOCOR) 20 MG tablet TAKE 1 TABLET BY MOUTH EVERY DAY IN THE EVENING 90 tablet 2  . glimepiride (AMARYL) 2 MG tablet Take 1 tablet (2 mg total) by mouth daily with breakfast. (Patient taking differently: Take 2 mg by mouth daily with breakfast. Takes 2 tablets once daily) 90 tablet 0  . canagliflozin (INVOKANA) 100 MG TABS tablet Take 1 tablet (100 mg total) by mouth daily before breakfast. (Patient not taking: Reported on 06/20/2019) 30 tablet 3  . empagliflozin (JARDIANCE) 25 MG TABS tablet Take 25 mg by mouth daily before breakfast. 30 tablet 6  . ferrous sulfate 325 (65 FE) MG EC tablet Take 1 tablet (325 mg total) by mouth daily with breakfast. (Patient not taking: Reported on 08/07/2019)     No facility-administered medications prior to visit.     Per HPI unless specifically indicated in ROS section below Review of Systems Objective:  BP 136/82 (BP Location: Left Arm, Patient Position: Sitting, Cuff Size: Large)   Pulse 70   Temp 98.2 F (36.8 C) (Temporal)   Ht _0  (1.778 m)   Wt 281 lb 5 oz (127.6 kg)   SpO2 94%   BMI 40.36 kg/m   Wt Readings from Last 3 Encounters:  10/02/19 281 lb 5 oz (127.6 kg)  02/08/19 284 lb 3.2 oz (128.9 kg)  02/02/19 285 lb (129.3 kg)      Physical Exam Vitals and nursing note reviewed.  Constitutional:      Appearance: Normal appearance. He is obese. He is not ill-appearing.  Eyes:     Extraocular Movements: Extraocular movements intact.     Pupils: Pupils are equal, round, and reactive to light.  Cardiovascular:     Rate and  Rhythm: Normal rate and regular rhythm. Occasional extrasystoles are present.    Pulses: Normal pulses.     Heart sounds: Normal heart sounds. No murmur.  Pulmonary:     Effort: Pulmonary effort is normal. No respiratory distress.     Breath sounds: Wheezing (mild) present. No rhonchi or rales.     Comments: Bibasilar crackles Abdominal:     General: Bowel sounds are normal. There is no distension.     Palpations: Abdomen is soft. There is no mass.     Tenderness: There is no abdominal tenderness. There is no right CVA tenderness, left CVA tenderness, guarding  or rebound. Negative signs include Murphy's sign.     Hernia: A hernia is present. Hernia is present in the umbilical area (large, reducible).  Musculoskeletal:     Cervical back: Normal range of motion and neck supple.     Right lower leg: Edema (tr) present.     Left lower leg: Edema (tr) present.  Skin:    General: Skin is warm and dry.     Findings: No rash.  Neurological:     Mental Status: He is alert.  Psychiatric:        Mood and Affect: Mood normal.        Behavior: Behavior normal.       Results for orders placed or performed in visit on 02/02/19  VITAMIN D 25 Hydroxy (Vit-D Deficiency, Fractures)  Result Value Ref Range   VITD 13.94 (L) 30.00 - 100.00 ng/mL  Folate  Result Value Ref Range   Folate 9.6 >5.9 ng/mL  IBC panel  Result Value Ref Range   Iron 11 (L) 42 - 165 ug/dL   Transferrin 356.0 212.0 - 360.0 mg/dL   Saturation Ratios 2.2 (L) 20.0 - 50.0 %  Ferritin  Result Value Ref Range   Ferritin 6.3 (L) 22.0 - 322.0 ng/mL  Vitamin B12  Result Value Ref Range   Vitamin B-12 231 211 - 911 pg/mL  CBC with Differential/Platelet  Result Value Ref Range   WBC 9.2 4.0 - 10.5 K/uL   RBC 4.67 4.22 - 5.81 Mil/uL   Hemoglobin 10.0 (L) 13.0 - 17.0 g/dL   HCT 33.0 (L) 39.0 - 52.0 %   MCV 70.6 (L) 78.0 - 100.0 fl   MCHC 30.4 30.0 - 36.0 g/dL   RDW 17.0 (H) 11.5 - 15.5 %   Platelets 294.0 150.0 - 400.0 K/uL    Neutrophils Relative % 59.3 43.0 - 77.0 %   Lymphocytes Relative 27.4 12.0 - 46.0 %   Monocytes Relative 9.9 3.0 - 12.0 %   Eosinophils Relative 2.6 0.0 - 5.0 %   Basophils Relative 0.8 0.0 - 3.0 %   Neutro Abs 5.5 1.4 - 7.7 K/uL   Lymphs Abs 2.5 0.7 - 4.0 K/uL   Monocytes Absolute 0.9 0.1 - 1.0 K/uL   Eosinophils Absolute 0.2 0.0 - 0.7 K/uL   Basophils Absolute 0.1 0.0 - 0.1 K/uL  PSA  Result Value Ref Range   PSA 0.57 0.10 - 4.00 ng/mL  Comprehensive metabolic panel  Result Value Ref Range   Sodium 140 135 - 145 mEq/L   Potassium 5.0 3.5 - 5.1 mEq/L   Chloride 100 96 - 112 mEq/L   CO2 33 (H) 19 - 32 mEq/L   Glucose, Bld 176 (H) 70 - 99 mg/dL   BUN 9 6 - 23 mg/dL   Creatinine, Ser 0.65 0.40 - 1.50 mg/dL   Total Bilirubin 0.3 0.2 - 1.2 mg/dL   Alkaline Phosphatase 60 39 - 117 U/L   AST 25 0 - 37 U/L   ALT 23 0 - 53 U/L   Total Protein 6.5 6.0 - 8.3 g/dL   Albumin 4.1 3.5 - 5.2 g/dL   Calcium 9.6 8.4 - 10.5 mg/dL   GFR 123.92 >60.00 mL/min  Lipid panel  Result Value Ref Range   Cholesterol 154 0 - 200 mg/dL   Triglycerides 344.0 (H) 0.0 - 149.0 mg/dL   HDL 39.00 (L) >39.00 mg/dL   VLDL 68.8 (H) 0.0 - 40.0 mg/dL   Total CHOL/HDL Ratio 4  NonHDL 114.54   Brain natriuretic peptide  Result Value Ref Range   Pro B Natriuretic peptide (BNP) 29.0 0.0 - 100.0 pg/mL  LDL cholesterol, direct  Result Value Ref Range   Direct LDL 86.0 mg/dL   EKG - sinus arrhythmia with 1st degree AV block, normal axis, no acute ST/T changes  Assessment & Plan:  This visit occurred during the SARS-CoV-2 public health emergency.  Safety protocols were in place, including screening questions prior to the visit, additional usage of staff PPE, and extensive cleaning of exam room while observing appropriate contact time as indicated for disinfecting solutions.   Problem List Items Addressed This Visit    Vitamin D deficiency    Update levels on replacement      Umbilical hernia without  obstruction and without gangrene   Sleep apnea, central    Appreciate pulm care.       Low serum vitamin B12    Update levels on replacement      Iron deficiency anemia    Update levels on replacement I asked him to call GI to reschedule colonoscopy and EGD (planned 01/2019 but pt cancelled)      Gassiness   Epigastric abdominal pain - Primary    Recurrent discomfort associated with gassiness, bloating despite BID PPI and probiotic - will add carafate 2 wk course.   I also asked him to decrease caffeine intake (2 pots of coffee a day).  I was previously unable to order carb breath test and referred to GI. Pt needs to reschedule GI appointment for EGD/colonoscopy.       Diabetes mellitus type 2, uncontrolled, with complications ([place])    Currently off invokana and jardiance due to cost, only on metformin and amaryl 27m daily with breakfast. Update A1c today.       Relevant Medications   glimepiride (AMARYL) 4 MG tablet   Chest pain   Relevant Orders   EKG 12-Lead (Completed)   At[place] and [place]utter [place]    S/p ablation. Currently off anticoagulation, only on aspirin 887mdaily. Update EKG today, encouraged f/u with Dr KoNehemiah Massed     Acute exacerbation of CHF (congestive heart failure) (HCC)    Anticipate component of this leading to dyspnea orthopnea and cough symptoms. He does endorse good UOP with lasix 2094mID. Will increase lasix to 59m36mmg for 3 days and have him update us wKoreah effect.  I also [facility] (KowNehemiah Massedr f/u (last seen 06/2018).        Other Visit Diagnoses    Orthopnea       Relevant Orders   DG Chest 2 View   Comprehensive metabolic panel   TSH   CBC with Differential/Platelet   Hemoglobin A1c   Brain natriuretic peptide       Meds ordered this encounter  Medications  . glimepiride (AMARYL) 4 MG tablet    Sig: Take 1 tablet (4 mg total) by mouth daily with breakfast.    Dispense:  90 tablet    Refill:   3  . albuterol (PROVENTIL) (2.5 MG/3ML) 0.083% nebulizer solution    Sig: Take 3 mLs (2.5 mg total) by nebulization every 6 (six) hours as needed for wheezing or shortness of breath.    Dispense:  150 mL    Refill:  1  . sucralfate (CARAFATE) 1 g tablet    Sig: Take 1 tablet (1 g total) by mouth 3 (three) times daily before meals.  Dispense:  60 tablet    Refill:  0   Orders Placed This Encounter  Procedures  . DG Chest 2 View    Standing Status:   Future    Number of Occurrences:   1    Standing Expiration Date:   10/01/2020    Order Specific Question:   Reason for Exam (SYMPTOM  OR DIAGNOSIS REQUIRED)    Answer:   dyspnea, bloating, h/o dCHF    Order Specific Question:   Preferred imaging location?    Answer:   [facility]    Order Specific Question:   Radiology Contrast Protocol - do NOT remove file path    Answer:   [email or web address]  . Comprehensive metabolic panel  . TSH  . CBC with Differential/Platelet  . Hemoglobin A1c  . Brain natriuretic peptide  . EKG 12-Lead    Patient Instructions  Call GI (Dr Allen Norris) office to schedule repeat colonoscopy and endoscopy [phone].  I do want you to call cardiology for follow up visit - to discuss blood thinner use.  EKG overall stable. Chest xray and labs today - we will call you with results.  I think you may have component of CHF flare - increase lasix to 2 tablets in the morning and 1 in the afternoon for 3 days then we will reassess symptoms. Ok to continue mucinex.  For GI - continue pantoprazole 20m twice daily. Add carafate three times daily with meals for 1-2 weeks. I agree with bland diet for the next few days. Let uKoreaknow if not improving with this.    Follow up plan: Return if symptoms worsen or fail to improve.  JRia Bush MD

## 2019-10-02 NOTE — Assessment & Plan Note (Signed)
Update levels on replacement I asked him to call GI to reschedule colonoscopy and EGD (planned 01/2019 but pt cancelled)

## 2019-10-02 NOTE — Patient Instructions (Addendum)
Call GI (Dr Allen Norris) office to schedule repeat colonoscopy and endoscopy [phone].  I do want you to call cardiology for follow up visit - to discuss blood thinner use.  EKG overall stable. Chest xray and labs today - we will call you with results.  I think you may have component of CHF flare - increase lasix to 2 tablets in the morning and 1 in the afternoon for 3 days then we will reassess symptoms. Ok to continue mucinex.  For GI - continue pantoprazole 40mg  twice daily. Add carafate three times daily with meals for 1-2 weeks. I agree with bland diet for the next few days. Let us know if not improving with this.

## 2019-10-02 NOTE — Assessment & Plan Note (Addendum)
Recurrent discomfort associated with gassiness, bloating despite BID PPI and probiotic - will add carafate 2 wk course.   I also asked him to decrease caffeine intake (2 pots of coffee a day).  I was previously unable to order carb breath test and referred to GI. Pt needs to reschedule GI appointment for EGD/colonoscopy.

## 2019-10-04 DIAGNOSIS — J42 Unspecified chronic bronchitis: Secondary | ICD-10-CM | POA: Diagnosis not present

## 2019-10-08 ENCOUNTER — Other Ambulatory Visit: Payer: Self-pay | Admitting: *Deleted

## 2019-10-08 ENCOUNTER — Telehealth: Payer: Self-pay

## 2019-10-08 NOTE — Telephone Encounter (Signed)
-----   Message from [name], MD sent at 10/06/2019  4:59 PM EDT ----- Plz notify labs returned showing normal kidneys, liver, thyroid and estimate of heart pumping function. Blood counts were improved. Trial carafate and back off caffeine as discussed. Also call GI for follow up appointment or to reschedule your endoscopy/colonoscopy.  Sugar was high with A1c 8.6% - only on glimepiride 4mg  and metformin 1000mg  bid. Would recommend restarting invokana or farxiga or jardiance - does he think he would be able to afford again? Alternatively could consider insulin How did he do with increased lasix dose for 3 days?

## 2019-10-08 NOTE — Telephone Encounter (Signed)
Patient aware of results and recommendations.  He would like to wait starting medications.  He has increased lasix and has not noticed a difference in his legs but his lungs feel better.

## 2019-10-08 NOTE — Patient Outreach (Signed)
[facility]) Care Management  10/08/2019  Demba Nigh Caulder 05/31/1955 [id number]    Telephone Screen  Referral Date:  10/02/2019 Referral Source:  EMMI Prevent Reason for Referral:  EMMI Prevent Screening/Join Care Management Insurance:  [employer]   Outreach Attempt:  Outreach attempt #1 to patient for telephone screening. No answer. RN Health Coach left HIPAA compliant voicemail message along with contact information.  Plan:  RN Health Coach will send unsuccessful outreach letter to patient.  RN Health Coach will make another outreach attempt to patient within 3-4 business days if no return call back from patient.   [place] [phone] [email or web address]

## 2019-10-10 ENCOUNTER — Other Ambulatory Visit: Payer: Self-pay | Admitting: *Deleted

## 2019-10-10 NOTE — Patient Outreach (Signed)
[facility]) Care Management  10/10/2019  Irfan Veal Gambino 21-Sep-1955 [id number]   Telephone Screen  Referral Date:  10/02/2019 Referral Source:  EMMI Prevent Reason for Referral:  EMMI Prevent Screening/Join Care Management Insurance:  [employer] Medicare   Outreach Attempt:  Outreach attempt #2 to patient for telephone screening. No answer. RN Health Coach left HIPAA compliant voicemail message along with contact information.  Plan:  RN Health Coach will make another outreach attempt to patient within 3-4 business days if no return call back from patient.   [place] [phone] [email or web address]

## 2019-10-11 ENCOUNTER — Telehealth: Payer: Self-pay | Admitting: Family Medicine

## 2019-10-11 NOTE — Telephone Encounter (Signed)
Pt has new dark red patches at hairline on lt side of face and on rt side of mid to lower back has new rash that itches; pt cannot see if has blister like area. Area on forehead did have blister like area that pt burst. More rash noted on forehead that is just red; no blister like. No fever,no H/A and no dizziness. Pt started carafate 10/06/19. Pt has not had shingles vaccine. Pt has been working outside and could be poison oak or ivy. Offered pt appt this afternoon but pt only wants to see Dr Darnell Level. Pt scheduled in office appt with Dr Darnell Level on 10/12/19 at 7:15. If rash on forehead moves toward eye pt will go to ED. UC & ED precautions given and pt voiced understanding. Pt has no covid symptoms(breathing issues long time), no travel and no known exposure to + covid.

## 2019-10-11 NOTE — Telephone Encounter (Signed)
Pt calling wanting to know next steps in treatment. Pt has developed a new rash on his face and side, feels related to everything going since last OV 10/02/19 with Dr Darnell Level - pt was seen in office for CP. Pt was wanting to be evaluated in office for the new rash but was also questioning if at this point he needs to be Covid tested. I advised that if he is tested for Covid we would at that point not be able to bring him in the office for further evaluation until results received.   Spoke with Leafy Ro about best course of action - she advised to have patient triaged by Rena to determine best course of action.

## 2019-10-12 ENCOUNTER — Other Ambulatory Visit: Payer: Self-pay

## 2019-10-12 ENCOUNTER — Other Ambulatory Visit: Payer: Self-pay | Admitting: *Deleted

## 2019-10-12 ENCOUNTER — Encounter: Payer: Self-pay | Admitting: Family Medicine

## 2019-10-12 ENCOUNTER — Ambulatory Visit (INDEPENDENT_AMBULATORY_CARE_PROVIDER_SITE_OTHER): Payer: Medicare Other | Admitting: Family Medicine

## 2019-10-12 DIAGNOSIS — R21 Rash and other nonspecific skin eruption: Secondary | ICD-10-CM | POA: Diagnosis not present

## 2019-10-12 MED ORDER — VALACYCLOVIR HCL 1 G PO TABS: 1000.0000 mg | ORAL_TABLET | Freq: Three times a day (TID) | ORAL | 0 refills | Status: DC

## 2019-10-12 MED ORDER — TRIAMCINOLONE ACETONIDE 0.1 % EX CREA
1.0000 | TOPICAL_CREAM | Freq: Two times a day (BID) | CUTANEOUS | 0 refills | Status: DC
Start: 2019-10-12 — End: 2020-01-04

## 2019-10-12 MED ORDER — ALBUTEROL SULFATE HFA 108 (90 BASE) MCG/ACT IN AERS: 2.0000 | INHALATION_SPRAY | Freq: Four times a day (QID) | RESPIRATORY_TRACT | 1 refills | Status: DC | PRN

## 2019-10-12 NOTE — Assessment & Plan Note (Signed)
Anticipate several different rashes: 1. Possible shingles to forehead - treat with valtrex course. Shingles handout provided. Doubt impetigo. 2. Back rash - ?poison ivy dermatitis - treat with TCI cream. 3. Leg rash ?dry skin dermatitis - monitor for now.

## 2019-10-12 NOTE — Progress Notes (Signed)
This visit was conducted in person.  BP 132/84   Pulse 74   Temp (!) 96.7 F (35.9 C) (Temporal)   Ht 5' 10" (1.778 m)   Wt 282 lb (127.9 kg)   SpO2 96%   BMI 40.46 kg/m    CC: rash Subjective:    Patient ID: [name], male    DOB: [birth year], 64 y.o.   MRN: [id number]  HPI: [name] is a 64 y.o. male presenting on 10/12/2019 for Rash (forehead and on side x2 days)   See prior note for details.   2d h/o rash to left forehead described as bumps after he was out working in the yard. It may have started as blisters. Also noted rash to right lower back and possibly on left lower leg. Hasn't tried anything for this rash yet. Works out in the yard, unsure if poison ivy exposure. Unsure if he ever had chicken pox.   Has not called GI to reschedule luminal evaluation for ongoing epigastric discomfort - but has this planned.   Ongoing dyspnea/orthopnea - increased lasix did help some - actually improved stomach cramping (also took carafate). Upcoming appt with Dr Nehemiah Massed June 30th.      Relevant past medical, surgical, family and social history reviewed and updated as indicated. Interim medical history since our last visit reviewed. Allergies and medications reviewed and updated. Outpatient Medications Prior to Visit  Medication Sig Dispense Refill  . ACCU-CHEK FASTCLIX LANCETS MISC Check blood sugar once daily and as instructed. Dx 250.00 100 each 3  . albuterol (PROVENTIL) (2.5 MG/3ML) 0.083% nebulizer solution Take 3 mLs (2.5 mg total) by nebulization every 6 (six) hours as needed for wheezing or shortness of breath. 150 mL 1  . aspirin EC 81 MG tablet Take 81 mg by mouth daily.    . Blood Glucose Monitoring Suppl (BLOOD GLUCOSE MONITOR SYSTEM) W/DEVICE KIT by Does not apply route. 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 carvedilol (COREG) 6.25 MG tablet TAKE 1 TABLET BY MOUTH 2 TIMES DAILY WITH A MEAL. 180 tablet 0  .  Cholecalciferol (VITAMIN D) 50 MCG (2000 UT) CAPS Take 1 capsule (2,000 Units total) by mouth daily. 30 capsule   . citalopram (CELEXA) 10 MG tablet TAKE 1 TABLET BY MOUTH EVERY DAY 90 tablet 2  . Cyanocobalamin (B-12) 1000 MCG SUBL Place 1 tablet under the tongue daily.    . ferrous sulfate 325 (65 FE) MG tablet Take 650 mg by mouth daily with breakfast.    . fluticasone (FLONASE) 50 MCG/ACT nasal spray SPRAY 2 SPRAYS INTO EACH NOSTRIL EVERY DAY (Patient taking differently: As needed) 16 mL 3  . furosemide (LASIX) 20 MG tablet TAKE 1 TABLET (20 MG TOTAL) BY MOUTH 2 (TWO) TIMES DAILY AS NEEDED. 180 tablet 0  . glimepiride (AMARYL) 4 MG tablet Take 1 tablet (4 mg total) by mouth daily with breakfast. 90 tablet 3  . hydrOXYzine (ATARAX/VISTARIL) 50 MG tablet Take 1 tablet (50 mg total) by mouth 2 (two) times daily as needed for anxiety. 180 tablet 1  . metFORMIN (GLUCOPHAGE) 1000 MG tablet TAKE 1 TABLET (1,000 MG TOTAL) BY MOUTH 2 (TWO) TIMES DAILY WITH A MEAL. 180 tablet 2  . montelukast (SINGULAIR) 10 MG tablet TAKE 1 TABLET BY MOUTH EVERYDAY AT BEDTIME 90 tablet 0  . ONETOUCH VERIO test strip USE AS INSTRUCTED TO CHECK THREE TIMES DAILY AND AS NEEDED. E11.8 300  strip 3  . pantoprazole (PROTONIX) 40 MG tablet TAKE 1 TABLET (40 MG TOTAL) BY MOUTH 2 (TWO) TIMES DAILY BEFORE A MEAL. 180 tablet 0  . Probiotic Product (PROBIOTIC DAILY PO) Take by mouth.    . simvastatin (ZOCOR) 20 MG tablet TAKE 1 TABLET BY MOUTH EVERY DAY IN THE EVENING 90 tablet 2  . sucralfate (CARAFATE) 1 g tablet Take 1 tablet (1 g total) by mouth 3 (three) times daily before meals. 60 tablet 0  . albuterol (VENTOLIN HFA) 108 (90 Base) MCG/ACT inhaler Inhale 2 puffs into the lungs every 6 (six) hours as needed for wheezing or shortness of breath. 18 g 1   No facility-administered medications prior to visit.     Per HPI unless specifically indicated in ROS section below Review of Systems Objective:  BP 132/84   Pulse 74    Temp (!) 96.7 F (35.9 C) (Temporal)   Ht 5' 10" (1.778 m)   Wt 282 lb (127.9 kg)   SpO2 96%   BMI 40.46 kg/m   Wt Readings from Last 3 Encounters:  10/12/19 282 lb (127.9 kg)  10/02/19 281 lb 5 oz (127.6 kg)  02/08/19 284 lb 3.2 oz (128.9 kg)      Physical Exam Vitals and nursing note reviewed.  Constitutional:      Appearance: Normal appearance. He is obese. He is not ill-appearing.  Eyes:     General: No scleral icterus.       Right eye: No discharge.        Left eye: No discharge.     Extraocular Movements: Extraocular movements intact.     Conjunctiva/sclera: Conjunctivae normal.     Pupils: Pupils are equal, round, and reactive to light.  Skin:    Findings: Rash present.     Comments:  L forehead rash with papulovesicular rash on erythematous base.  R lower back papular pruritic rash L lower leg scaly erythematous dry skin rash  Neurological:     Mental Status: He is alert.  Psychiatric:        Mood and Affect: Mood normal.        Behavior: Behavior normal.       Assessment & Plan:  This visit occurred during the SARS-CoV-2 public health emergency.  Safety protocols were in place, including screening questions prior to the visit, additional usage of staff PPE, and extensive cleaning of exam room while observing appropriate contact time as indicated for disinfecting solutions.   Problem List Items Addressed This Visit    Skin rash    Anticipate several different rashes: 1. Possible shingles to forehead - treat with valtrex course. Shingles handout provided. Doubt impetigo. 2. Back rash - ?poison ivy dermatitis - treat with TCI cream. 3. Leg rash ?dry skin dermatitis - monitor for now.           Meds ordered this encounter  Medications  . albuterol (VENTOLIN HFA) 108 (90 Base) MCG/ACT inhaler    Sig: Inhale 2 puffs into the lungs every 6 (six) hours as needed for wheezing or shortness of breath.    Dispense:  18 g    Refill:  1    Formulate based on cost    . valACYclovir (VALTREX) 1000 MG tablet    Sig: Take 1 tablet (1,000 mg total) by mouth 3 (three) times daily.    Dispense:  21 tablet    Refill:  0  . triamcinolone cream (KENALOG) 0.1 %    Sig: Apply 1  application topically 2 (two) times daily. Apply to AA.    Dispense:  80 g    Refill:  0   No orders of the defined types were placed in this encounter.   Patient instructions: For possible shingles on the forehead, treat with valtrex 1 week course. For rash on back may use triamcinolone cream twice daily for 1-2 weeks.   Follow up plan: Return if symptoms worsen or fail to improve.  [name], MD

## 2019-10-12 NOTE — Patient Instructions (Addendum)
For possible shingles on the forehead, treat with valtrex 1 week course. For rash on back may use triamcinolone cream twice daily for 1-2 weeks.   Shingles  Shingles is an infection. It gives you a painful skin rash and blisters that have fluid in them. Shingles is caused by the same germ (virus) that causes chickenpox. Shingles only happens in people who:  Have had chickenpox.  Have been given a shot of medicine (vaccine) to protect against chickenpox. Shingles is rare in this group. The first symptoms of shingles may be itching, tingling, or pain in an area on your skin. A rash will show on your skin a few days or weeks later. The rash is likely to be on one side of your body. The rash usually has a shape like a belt or a band. Over time, the rash turns into fluid-filled blisters. The blisters will break open, change into scabs, and dry up. Medicines may:  Help with pain and itching.  Help you get better sooner.  Help to prevent long-term problems. Follow these instructions at home: Medicines  Take over-the-counter and prescription medicines only as told by your doctor.  Put on an anti-itch cream or numbing cream where you have a rash, blisters, or scabs. Do this as told by your doctor. Helping with itching and discomfort   Put cold, wet cloths (cold compresses) on the area of the rash or blisters as told by your doctor.  Cool baths can help you feel better. Try adding baking soda or dry oatmeal to the water to lessen itching. Do not bathe in hot water. Blister and rash care  Keep your rash covered with a loose bandage (dressing).  Wear loose clothing that does not rub on your rash.  Keep your rash and blisters clean. To do this, wash the area with mild soap and cool water as told by your doctor.  Check your rash every day for signs of infection. Check for: ? More redness, swelling, or pain. ? Fluid or blood. ? Warmth. ? Pus or a bad smell.  Do not scratch your rash. Do  not pick at your blisters. To help you to not scratch: ? Keep your fingernails clean and cut short. ? Wear gloves or mittens when you sleep, if scratching is a problem. General instructions  Rest as told by your doctor.  Keep all follow-up visits as told by your doctor. This is important.  Wash your hands often with soap and water. If soap and water are not available, use hand sanitizer. Doing this lowers your chance of getting a skin infection caused by germs (bacteria).  Your infection can cause chickenpox in people who have never had chickenpox or never got a shot of chickenpox vaccine. If you have blisters that did not change into scabs yet, try not to touch other people or be around other people, especially: ? Babies. ? Pregnant women. ? Children who have areas of red, itchy, or rough skin (eczema). ? Very old people who have transplants. ? People who have a long-term (chronic) sickness, like cancer or AIDS. Contact a doctor if:  Your pain does not get better with medicine.  Your pain does not get better after the rash heals.  You have any signs of infection in the rash area. These signs include: ? More redness, swelling, or pain around the rash. ? Fluid or blood coming from the rash. ? The rash area feeling warm to the touch. ? Pus or a bad smell coming  from the rash. Get help right away if:  The rash is on your face or nose.  You have pain in your face or pain by your eye.  You lose feeling on one side of your face.  You have trouble seeing.  You have ear pain, or you have ringing in your ear.  You have a loss of taste.  Your condition gets worse. Summary  Shingles gives you a painful skin rash and blisters that have fluid in them.  Shingles is an infection. It is caused by the same germ (virus) that causes chickenpox.  Keep your rash covered with a loose bandage (dressing). Wear loose clothing that does not rub on your rash.  If you have blisters that did  not change into scabs yet, try not to touch other people or be around people. This information is not intended to replace advice given to you by your health care provider. Make sure you discuss any questions you have with your health care provider. Document Revised: 08/04/2018 Document Reviewed: 12/15/2016 Elsevier Patient Education  2020 [employer].

## 2019-10-12 NOTE — Patient Outreach (Signed)
[facility]) Care Management  10/12/2019  TYLER CUBIT 02/05/1956 [id number]   Telephone Screen  Referral Date:10/02/2019 Referral Source:EMMI Prevent Reason for Referral:EMMI Prevent Screening/Join Care Management Insurance:[employer]   Outreach Attempt:  Outreach attempt #3 to patient for telephone screening. No answer. RN Health Coach left HIPAA compliant voicemail message along with contact information.  Plan:  RN Health Coach will make final outreach attempt to patient within the next 4 weeks if no return call back from patient.   [place] [phone] [email or web address]

## 2019-10-17 ENCOUNTER — Other Ambulatory Visit: Payer: Self-pay | Admitting: Family Medicine

## 2019-10-17 ENCOUNTER — Telehealth: Payer: Medicare Other

## 2019-10-18 NOTE — Chronic Care Management (AMB) (Unsigned)
Chronic Care Management Pharmacy  Name: [name]  MRN: [id number] DOB: 11/12/1955  Chief Complaint/ HPI  [name],  64 y.o., male presents for their Follow-Up CCM visit with the clinical pharmacist via telephone.  PCP : [name], MD  Their chronic conditions include: atrial fibrillation/flutter, hypertension, coronary artery disease, heart failure, restrictive lung disease, chronic bronchitis, hyperlipidemia, type 2 diabetes, chronic pain, panic attacks, vitamin D deficiency  Patient concerns:  Office Visits:   10/12/19: PCP visit - possible shingles, rx valtrex   10/02/19: PCP visit - chest and epigastric pain, dyspnea started 3 days ago, tried gas-x and pepto-bismal without improvement. 2 pots of coffee/day, on protonix 40 mg BID, probiotic, lasix 20 mg bid, reports albuterol inhaler is not helping, asking about nebulizer --> add 2 week course carafate, decrease caffeine, reschedule GI appt, increase lasix for 3 days, f/u with cardiology for blood thinner use   09/18/19: CCM visit --> increase amaryl to 55m with BF (pt did not increase previously)  07/20/19: CCM visit --> consulted with PCP, stop jardiance, increase amaryl to 453mwith breakfast   Allergies  Allergen Reactions  . Atorvastatin Other (See Comments)    Dizziness  . Lisinopril Cough   Medications: Outpatient Encounter Medications as of 10/17/2019  Medication Sig Note  . ACCU-CHEK FASTCLIX LANCETS MISC Check blood sugar once daily and as instructed. Dx 250.00   . albuterol (PROVENTIL) (2.5 MG/3ML) 0.083% nebulizer solution Take 3 mLs (2.5 mg total) by nebulization every 6 (six) hours as needed for wheezing or shortness of breath.   . Marland Kitchenlbuterol (VENTOLIN HFA) 108 (90 Base) MCG/ACT inhaler Inhale 2 puffs into the lungs every 6 (six) hours as needed for wheezing or shortness of breath.   . Marland Kitchenspirin EC 81 MG tablet Take 81 mg by mouth daily.   . Blood Glucose Monitoring Suppl (BLOOD GLUCOSE  MONITOR SYSTEM) W/DEVICE KIT by Does not apply route. Use to check sugar once daily and as needed Dx: E11.9 **ONE TOUCH VERIO**   . carvedilol (COREG) 6.25 MG tablet TAKE 1 TABLET BY MOUTH 2 TIMES DAILY WITH A MEAL.   . Marland Kitchenholecalciferol (VITAMIN D) 50 MCG (2000 UT) CAPS Take 1 capsule (2,000 Units total) by mouth daily.   . citalopram (CELEXA) 10 MG tablet TAKE 1 TABLET BY MOUTH EVERY DAY   . Cyanocobalamin (B-12) 1000 MCG SUBL Place 1 tablet under the tongue daily. 06/20/2019: Not SL, takes tablet  . ferrous sulfate 325 (65 FE) MG tablet Take 650 mg by mouth daily with breakfast.   . fluticasone (FLONASE) 50 MCG/ACT nasal spray SPRAY 2 SPRAYS INTO EACH NOSTRIL EVERY DAY (Patient taking differently: As needed)   . furosemide (LASIX) 20 MG tablet TAKE 1 TABLET (20 MG TOTAL) BY MOUTH 2 (TWO) TIMES DAILY AS NEEDED.   . Marland Kitchenlimepiride (AMARYL) 4 MG tablet Take 1 tablet (4 mg total) by mouth daily with breakfast.   . hydrOXYzine (ATARAX/VISTARIL) 50 MG tablet Take 1 tablet (50 mg total) by mouth 2 (two) times daily as needed for anxiety.   . metFORMIN (GLUCOPHAGE) 1000 MG tablet TAKE 1 TABLET (1,000 MG TOTAL) BY MOUTH 2 (TWO) TIMES DAILY WITH A MEAL.   . montelukast (SINGULAIR) 10 MG tablet TAKE 1 TABLET BY MOUTH EVERYDAY AT BEDTIME   . ONETOUCH VERIO test strip USE AS INSTRUCTED TO CHECK THREE TIMES DAILY AND AS NEEDED. E11.8   . pantoprazole (PROTONIX) 40 MG tablet TAKE 1 TABLET (40 MG TOTAL) BY MOUTH 2 (TWO) TIMES  DAILY BEFORE A MEAL.   . Probiotic Product (PROBIOTIC DAILY PO) Take by mouth.   . simvastatin (ZOCOR) 20 MG tablet TAKE 1 TABLET BY MOUTH EVERY DAY IN THE EVENING   . sucralfate (CARAFATE) 1 g tablet Take 1 tablet (1 g total) by mouth 3 (three) times daily before meals.   . triamcinolone cream (KENALOG) 0.1 % Apply 1 application topically 2 (two) times daily. Apply to AA.   . valACYclovir (VALTREX) 1000 MG tablet Take 1 tablet (1,000 mg total) by mouth 3 (three) times daily.    No  facility-administered encounter medications on file as of 10/17/2019.   Current Diagnosis/Assessment: Goals    .  Pharmacy Care Plan      Current Barriers:  . Chronic Disease Management support, education, and care coordination needs related to atrial fibrillation/flutter, hypertension, coronary artery disease, heart failure, restrictive lung disease, chronic bronchitis, hyperlipidemia, type 2 diabetes, chronic pain, panic attacks, vitamin D deficiency  Pharmacist Clinical Goal(s):  Over the next 4 weeks, patient will work with PharmD and primary care provider to address the following goals: Marland Kitchen Diabetes: Achieve control of blood sugars with goal A1c < 7%.  . Weight Loss: Achieve weight goal of 240 lbs. Current weight 262 lbs.  . Atrial fibrillation: Reduce risk of stroke with atrial fibrillation by starting appropriate therapy . Vaccinations: Remain up to date on vaccinations.  . Cholesterol: Improve cholesterol within goal of less than 70 mg/dL. Current LDL: 86 mg/dL . Allergies: Maintain allergy symptom control with safe OTC product use.  Marland Kitchen Heart failure: Prevent shortness of breath and fluid overload.   . Iron Deficiency: Assess efficacy of iron and vitamin D supplementation.   Interventions: . Comprehensive medication review performed. . Discussed diet and exercise goals for heart health/weight loss.  . Increase glimepiride 2 mg to 2 tablets daily with breakfast and continue metformin 1000 mg twice daily with meals.  . Recommend annual comprehensive foot exam and eye exam for Diabetes. . Recommend tetanus (Tdap) vaccine and shingles (Shingrix) vaccine. . Recommend starting warfarin therapy for atrial fibrillation pending labs/colonoscopy.  . Recommend switching to a higher potency statin such as rosuvastatin 20 mg or adding ezetimibe pending lipid panel.  . Recommend repeat lab monitoring for iron and vitamin D.  Patient Self Care Activities:  . Increase glimepiride 2 mg to 2 tablets  daily with breakfast  . Continue to monitor blood glucose twice daily  . Call if any hypoglycemia (BG < 70 mg/dL) . Work towards increasing physical activity with goal of 30 minutes, 5 days per week . Schedule lab appointment and virtual office visit with Dr. Darnell Level to discuss starting warfarin to prevent strokes . Continue to check your weight daily and call if weight gain of more than 3 pounds overnight . Switch diphenhydramine for allergies to safer alternative such as cetirizine 10 mg daily . Check with local pharmacy for shingles and tetanus vaccines  Please see past updates related to this goal by clicking on the "Past Updates" button in the selected goal     .  weight management (pt-stated)      Starting 10/04/2016, I will continue to increase intake of fish and vegetables as well as decrease intake of sugar and bread in an effort to lose weight. Target weight is 250 lbs.       Diabetes   Recent Relevant Labs: Lab Results  Component Value Date/Time   HGBA1C 8.6 (H) 10/02/2019 12:05 PM   HGBA1C 7.3 (A) 01/02/2019 09:03  AM   HGBA1C 7.5 (H) 09/19/2018 10:52 AM   HGBA1C 7.0 (H) 01/24/2013 04:07 AM   MICROALBUR 1.0 10/17/2017 11:35 AM   MICROALBUR 0.9 10/04/2016 09:23 AM    Checking BG: 2x per day (time of day varies) Blood glucose range: Last 6 readings:160 (AM) - today, 167 bedtime, 181, 190, 272, 253, 205 (AM)  A1c < 7% Patient has failed these meds in past: cost - Jardiance, Farxiga  Patient is currently uncontrolled on the following medications:   Glimepiride 2 mg - 1 tablet daily with breakfast (restarted 07/26/19)  Metformin 1000 mg - 1 tablet twice daily with meals  Last diabetic eye exam:  Lab Results  Component Value Date/Time   HMDIABEYEEXA No Retinopathy 05/10/2017 12:00 AM    Last diabetic foot exam: recommend annual foot exam   Diet: reports poor diet since last visit - eating big macs lately, went on vacation and ate poorly  Exercise: none except yard  work  Weight loss: on 08/07/19 pt reported weight of 250 lbs, he is up today to 262 lbs (09/18/19)  Plan: Increase glimepiride to 2 tablets (4 mg) every morning. Continue to monitor BG twice daily. Work towards weight loss goals and resuming healthy diet.   AFIB  Last cardiology visit 06/2018: Hilbert Odor, PA; Dr. Nehemiah Massed  CHA2DS2-VASc score of 2 (CHF and diabetes mellitus). Have recommended anticoagulation for stroke risk reduction given CHA2DS2-VASc score. Discussed risks and benefits of anticoagulation. He is agreeable to the plan of care. Will prescribe Eliquis 5 mg BID. Since we are starting anticoagulation, will discontinue aspirin use to reduce bleeding risk.   Patient is currently rate controlled CHA2DS2-VASc score: 3 (HTN, CHF and diabetes mellitus) CPAP: wears occasionally, not daily, tried several different kinds of masks  Patient has tried these meds in past: warfarin, discontinued after ablation 2014 Patient is currently controlled on the following medications:   Carvedilol 6.25 mg - 1 tablet twice daily with meal  Aspirin 81 mg - 1 tablet daily   Pertinent history: Cardiologist started Eliquis 07/04/18, patient did not start due to cost; discussed warfarin with patient on 07/20/19 and he is agreeable to start, PCP would like to have office visit with patient prior to initiation of warfarin  Plan: Continue current medications; Continued to encourage scheduling follow up with PCP to discuss warfarin and colonoscopy.   Hyperlipidemia/CAD   Lipid Panel     Component Value Date/Time   CHOL 154 02/02/2019 0833   CHOL 203 (H) 01/24/2013 0407   TRIG 344.0 (H) 02/02/2019 0833   TRIG 355 (H) 01/24/2013 0407   HDL 39.00 (L) 02/02/2019 0833   HDL 34 (L) 01/24/2013 0407   CHOLHDL 4 02/02/2019 0833   VLDL 68.8 (H) 02/02/2019 0833   VLDL 71 (H) 01/24/2013 0407   LDLCALC 90 09/07/2013 1119   LDLCALC 98 01/24/2013 0407   LDLDIRECT 86.0 02/02/2019 0833   LDL goal < 70  (CAD) Patient has failed these meds in past: atorvastatin (dizziness) Patient is currently uncontrolled on the following medications:   Aspirin 81 mg - once daily  Simvastatin 20 mg - 1 tablet daily  We discussed: cardiac history, denies personal history of heart attack or stroke; started on atorvastatin 20 mg in 2014 per hospital discharge, did not tolerate.  Plan: Continue moderate intensity statin. Repeat lipid panel this month then reassess.   Hypertension/CHF   Office blood pressures are: BP Readings from Last 3 Encounters:  10/12/19 132/84  10/02/19 136/82  02/08/19 (!) 147/91  CMP Latest Ref Rng & Units 10/02/2019 02/02/2019 12/22/2018  Glucose 70 - 99 mg/dL 137(H) 176(H) 175(H)  BUN 6 - 23 mg/dL 6 9 6(L)  Creatinine 0.40 - 1.50 mg/dL 0.62 0.65 0.64  Sodium 135 - 145 mEq/L 138 140 138  Potassium 3.5 - 5.1 mEq/L 4.9 5.0 3.9  Chloride 96 - 112 mEq/L 98 100 100  CO2 19 - 32 mEq/L 35(H) 33(H) 30  Calcium 8.4 - 10.5 mg/dL 9.4 9.6 9.1  Total Protein 6.0 - 8.3 g/dL 6.9 6.5 7.0  Total Bilirubin 0.2 - 1.2 mg/dL 0.3 0.3 0.7  Alkaline Phos 39 - 117 U/L 59 60 63  AST 0 - 37 U/L _0 ALT 0 - 53 U/L _1 BP goal < 140/90 mmHg Patient has failed these meds in the past: none reported Patient checks BP at home: rarely Patient home BP readings are ranging: none reported  Patient is currently controlled on the following medications:   Carvedilol 6.25 mg - 1 tablet twice daily with meal  Furosemide 20 mg - 1 tablet twice daily (pt never decreased to once daily as discussed at last visit on 08/07/19)  We discussed: weight stable, weighs daily, denies swelling or SOB - pt now prefers to stay on furosemide BID due to concern about his legs swelling when doing yard work  Plan: Continue current medications    Vitamin D/Iron/B12 Deficiency  Vitamin B12: (02/02/19) 231 --> 510 10/02/19 Vitamin D (02/02/19): 14 (low)  --> 10/02/19 28 (low)  Iron/TIBC/Ferritin/ %Sat    Component  Value Date/Time   IRON 74 10/02/2019 1455   FERRITIN 11.2 (L) 10/02/2019 1455   IRONPCTSAT 15.3 (L) 10/02/2019 1455   CBC Latest Ref Rng & Units 10/02/2019 02/02/2019 12/22/2018  WBC 4.0 - 10.5 K/uL 8.8 9.2 8.2  Hemoglobin 13.0 - 17.0 g/dL 12.7(L) 10.0(L) 10.0(L)  Hematocrit 39 - 52 % 39.5 33.0(L) 34.6(L)  Platelets 150 - 400 K/uL 222.0 294.0 233   Patient has failed these meds in past: none Patient is currently on the following medications (labs have not been updated since pt started supplements):   Vitamin D 2000 IU - 1 capsule daily (50 mcg)  Vitamin B12 1000 mcg - 1 tablet daily  Ferrous sulfate 325 mg - 2 tablet daily  Plan: Continue current medications; Updating CBC, vitamin D and iron panel with upcoming lab appt.   Vaccines   Reviewed and discussed patient's vaccination history.    Immunization History  Administered Date(s) Administered  . Influenza,inj,Quad PF,6+ Mos 05/29/2014, 02/21/2015, 02/22/2017, 03/16/2018, 01/02/2019  . Influenza-Unspecified 01/24/2013  . PFIZER SARS-COV-2 Vaccination 07/12/2019, 08/02/2019  . Pneumococcal Polysaccharide-23 02/24/2013  . Td 04/26/2006   Plan: Recommended patient receive Tdap and Shingrix    Medication Management:  Pharmacy: [facility]  Affordability: denies concerns  CCM Follow Up:  10/17/19 at 3:30 PM (telephone)  Debbora Dus, PharmD Clinical Pharmacist [facility] [phone] .

## 2019-10-24 DIAGNOSIS — I420 Dilated cardiomyopathy: Secondary | ICD-10-CM | POA: Diagnosis not present

## 2019-10-24 DIAGNOSIS — I714 Abdominal aortic aneurysm, without rupture: Secondary | ICD-10-CM | POA: Diagnosis not present

## 2019-10-24 DIAGNOSIS — I4891 Unspecified atrial fibrillation: Secondary | ICD-10-CM | POA: Diagnosis not present

## 2019-10-24 DIAGNOSIS — I25118 Atherosclerotic heart disease of native coronary artery with other forms of angina pectoris: Secondary | ICD-10-CM | POA: Diagnosis not present

## 2019-10-24 DIAGNOSIS — I4892 Unspecified atrial flutter: Secondary | ICD-10-CM | POA: Diagnosis not present

## 2019-10-25 ENCOUNTER — Other Ambulatory Visit: Payer: Self-pay | Admitting: Family Medicine

## 2019-11-08 ENCOUNTER — Other Ambulatory Visit: Payer: Self-pay | Admitting: *Deleted

## 2019-11-08 NOTE — Patient Outreach (Signed)
[facility]) Care Management  11/08/2019  [name] 1956/01/28 [id number]   Telephone Screen  Referral Date:10/02/2019 Referral Source:EMMI Prevent Reason for Referral:EMMI Prevent Screening/Join Care Management Insurance:[employer]   Outreach Attempt:  Outreach attempt #4 to patient for telephone screening. No answer. RN Health Coach left HIPAA compliant voicemail message along with contact information.  Multiple attempts to establish contact with patient without success. No response from letter mailed to patient. Case is being closed at this time.   Plan:  RN Health Coach will close case based on inability to establish contact with patient.   Nyack [phone] [name]

## 2019-11-22 ENCOUNTER — Other Ambulatory Visit: Payer: Self-pay | Admitting: Family Medicine

## 2019-11-22 NOTE — Telephone Encounter (Signed)
Pharmacy is requesting 90 day supply.  Please advise if appropriate.

## 2019-11-23 NOTE — Telephone Encounter (Signed)
ERx 

## 2019-11-27 ENCOUNTER — Telehealth: Payer: Self-pay | Admitting: *Deleted

## 2019-11-27 NOTE — Telephone Encounter (Signed)
Left message for patient to notify them that it is time to schedule annual low dose lung cancer screening CT scan. Instructed patient to call back to discuss.

## 2019-12-06 ENCOUNTER — Other Ambulatory Visit: Payer: Self-pay | Admitting: Family Medicine

## 2019-12-06 NOTE — Telephone Encounter (Signed)
E-scribed refill.  Plz schedule wellness, cpe and lab visits.  

## 2019-12-08 ENCOUNTER — Other Ambulatory Visit: Payer: Self-pay | Admitting: Family Medicine

## 2019-12-23 ENCOUNTER — Other Ambulatory Visit: Payer: Self-pay | Admitting: Family Medicine

## 2019-12-24 ENCOUNTER — Telehealth: Payer: Self-pay

## 2019-12-24 NOTE — Telephone Encounter (Signed)
E-scribed refills.  Plz schedule wellness, cpe and lab visits.  

## 2019-12-24 NOTE — Telephone Encounter (Signed)
Message left for the patient to call back. He is over due for his CT Chest scan for follow up for mediastinal mass.

## 2019-12-27 ENCOUNTER — Other Ambulatory Visit: Payer: Self-pay | Admitting: Family Medicine

## 2020-01-04 ENCOUNTER — Encounter: Payer: Self-pay | Admitting: Family Medicine

## 2020-01-04 ENCOUNTER — Ambulatory Visit (INDEPENDENT_AMBULATORY_CARE_PROVIDER_SITE_OTHER): Payer: Medicare Other | Admitting: Family Medicine

## 2020-01-04 ENCOUNTER — Other Ambulatory Visit: Payer: Self-pay

## 2020-01-04 VITALS — BP 140/72 | HR 74 | Temp 97.7°F | Ht 70.0 in | Wt 275.6 lb

## 2020-01-04 DIAGNOSIS — E118 Type 2 diabetes mellitus with unspecified complications: Secondary | ICD-10-CM | POA: Diagnosis not present

## 2020-01-04 DIAGNOSIS — J9859 Other diseases of mediastinum, not elsewhere classified: Secondary | ICD-10-CM

## 2020-01-04 DIAGNOSIS — G8929 Other chronic pain: Secondary | ICD-10-CM

## 2020-01-04 DIAGNOSIS — I4891 Unspecified atrial fibrillation: Secondary | ICD-10-CM | POA: Diagnosis not present

## 2020-01-04 DIAGNOSIS — D509 Iron deficiency anemia, unspecified: Secondary | ICD-10-CM

## 2020-01-04 DIAGNOSIS — K429 Umbilical hernia without obstruction or gangrene: Secondary | ICD-10-CM

## 2020-01-04 DIAGNOSIS — Z23 Encounter for immunization: Secondary | ICD-10-CM

## 2020-01-04 DIAGNOSIS — R079 Chest pain, unspecified: Secondary | ICD-10-CM

## 2020-01-04 DIAGNOSIS — M25511 Pain in right shoulder: Secondary | ICD-10-CM | POA: Diagnosis not present

## 2020-01-04 DIAGNOSIS — I4892 Unspecified atrial flutter: Secondary | ICD-10-CM

## 2020-01-04 DIAGNOSIS — E1165 Type 2 diabetes mellitus with hyperglycemia: Secondary | ICD-10-CM

## 2020-01-04 DIAGNOSIS — IMO0002 Reserved for concepts with insufficient information to code with codable children: Secondary | ICD-10-CM

## 2020-01-04 DIAGNOSIS — R140 Abdominal distension (gaseous): Secondary | ICD-10-CM

## 2020-01-04 DIAGNOSIS — I10 Essential (primary) hypertension: Secondary | ICD-10-CM

## 2020-01-04 DIAGNOSIS — E785 Hyperlipidemia, unspecified: Secondary | ICD-10-CM

## 2020-01-04 DIAGNOSIS — I2510 Atherosclerotic heart disease of native coronary artery without angina pectoris: Secondary | ICD-10-CM

## 2020-01-04 DIAGNOSIS — I5022 Chronic systolic (congestive) heart failure: Secondary | ICD-10-CM

## 2020-01-04 DIAGNOSIS — R1013 Epigastric pain: Secondary | ICD-10-CM

## 2020-01-04 DIAGNOSIS — E1169 Type 2 diabetes mellitus with other specified complication: Secondary | ICD-10-CM

## 2020-01-04 DIAGNOSIS — M5030 Other cervical disc degeneration, unspecified cervical region: Secondary | ICD-10-CM | POA: Diagnosis not present

## 2020-01-04 LAB — COMPREHENSIVE METABOLIC PANEL
ALT: 30 U/L (ref 0–53)
AST: 38 U/L — ABNORMAL HIGH (ref 0–37)
Albumin: 4.6 g/dL (ref 3.5–5.2)
Alkaline Phosphatase: 64 U/L (ref 39–117)
BUN: 10 mg/dL (ref 6–23)
CO2: 36 mEq/L — ABNORMAL HIGH (ref 19–32)
Calcium: 9.9 mg/dL (ref 8.4–10.5)
Chloride: 97 mEq/L (ref 96–112)
Creatinine, Ser: 0.7 mg/dL (ref 0.40–1.50)
GFR: 113.43 mL/min (ref >60.00)
Glucose, Bld: 169 mg/dL — ABNORMAL HIGH (ref 70–99)
Potassium: 4.5 mEq/L (ref 3.5–5.1)
Sodium: 140 mEq/L (ref 135–145)
Total Bilirubin: 0.5 mg/dL (ref 0.2–1.2)
Total Protein: 6.9 g/dL (ref 6.0–8.3)

## 2020-01-04 LAB — CBC WITH DIFFERENTIAL/PLATELET
Basophils Absolute: 0 10*3/uL (ref 0.0–0.1)
Basophils Relative: 0.4 % (ref 0.0–3.0)
Eosinophils Absolute: 0.1 10*3/uL (ref 0.0–0.7)
Eosinophils Relative: 1.4 % (ref 0.0–5.0)
HCT: 45.2 % (ref 39.0–52.0)
Hemoglobin: 14.7 g/dL (ref 13.0–17.0)
Lymphocytes Relative: 24.2 % (ref 12.0–46.0)
Lymphs Abs: 2.5 10*3/uL (ref 0.7–4.0)
MCHC: 32.6 g/dL (ref 30.0–36.0)
MCV: 85 fl (ref 78.0–100.0)
Monocytes Absolute: 1.1 10*3/uL — ABNORMAL HIGH (ref 0.1–1.0)
Monocytes Relative: 10.3 % (ref 3.0–12.0)
Neutro Abs: 6.6 10*3/uL (ref 1.4–7.7)
Neutrophils Relative %: 63.7 % (ref 43.0–77.0)
Platelets: 222 10*3/uL (ref 150.0–400.0)
RBC: 5.31 Mil/uL (ref 4.22–5.81)
RDW: 16.2 % — ABNORMAL HIGH (ref 11.5–15.5)
WBC: 10.3 10*3/uL (ref 4.0–10.5)

## 2020-01-04 LAB — HEMOGLOBIN A1C: Hgb A1c MFr Bld: 7.8 % — ABNORMAL HIGH (ref 4.6–6.5)

## 2020-01-04 NOTE — Progress Notes (Signed)
This visit was conducted in person.  BP 140/72 (BP Location: Left Arm, Patient Position: Sitting, Cuff Size: Large)   Pulse 74   Temp 97.7 F (36.5 C) (Temporal)   Ht 5' 10" (1.778 m)   Wt 275 lb 9 oz (125 kg)   SpO2 90%   BMI 39.54 kg/m    CC: "my stomach is killing me" Subjective:    Patient ID: [name], male    DOB: 07-30-1955, 64 y.o.   MRN: [id number]  HPI: ZAYDYN HAVEY is a 64 y.o. male presenting on 01/04/2020 for Abdominal Pain (C/o abd cramps and diarrhea.  Denies any nausea or vomiting.  Sxs started about 2 wks ago.  Says it causes tightness in chest and SOB. ), Shoulder Pain (C/o numbness/tingling in right shoulder radiating up into neck.), and Discuss Medication (C/o iron causing constipation. )   2 wk h/o abd pain, cramping, diarrhea, causing chest tightness and dyspnea. Ongoing significant bloating/gas causes dyspnea. He continues protonix 1m bid and probiotic but ran out of carafate. Intermittent trouble swallowing attributed to gassiness - without true dysphagia. Decreased appetite. Increased diarrhea over the past 2 weeks. Logistic difficulties for transportation for procedures. He canceled GI eval for luminal evaluation in setting of COVID pandemic (02/2019).   Denies fevers, chills, cough. Chronic nasal congestion and dyspnea/chest discomfort.   Previously increasing lasix and carafate helped symptoms.   Also having 3 days of R shoulder paresthesias with radiation up to neck, no significant arm pain or weakness.  Saw cards 09/2019 - normal holter monitor 10/2019 - no signs of afib or aflutter.  Cardiology clarified pt is not on warfarin because he had aflutter, not afib. Only on aspirin. Has not completed sestamibi exercise test or echocardiogram recommended by cardiology. Currently taking 455mlasix in the morning and 2072mn evenings.   Iron is causing constipation - has dropped to once daily dosing. Dark stools attributed to iron and pepto bismol  use.   Due for thoracic surgery f/u (OaFaith Rogueor mediastinal mass thought benign mediastinal cyst.      Relevant past medical, surgical, family and social history reviewed and updated as indicated. Interim medical history since our last visit reviewed. Allergies and medications reviewed and updated. Outpatient Medications Prior to Visit  Medication Sig Dispense Refill  . ACCU-CHEK FASTCLIX LANCETS MISC Check blood sugar once daily and as instructed. Dx 250.00 100 each 3  . albuterol (PROVENTIL) (2.5 MG/3ML) 0.083% nebulizer solution Take 3 mLs (2.5 mg total) by nebulization every 6 (six) hours as needed for wheezing or shortness of breath. 150 mL 1  . albuterol (VENTOLIN HFA) 108 (90 Base) MCG/ACT inhaler INHALE 2 PUFFS BY MOUTH EVERY 6 HOURS AS NEEDED FOR WHEEZE OR SHORTNESS OF BREATH 18 g 1  . aspirin EC 81 MG tablet Take 81 mg by mouth daily.    . Blood Glucose Monitoring Suppl (BLOOD GLUCOSE MONITOR SYSTEM) W/DEVICE KIT by Does not apply route. 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 carvedilol (COREG) 6.25 MG tablet TAKE 1 TABLET BY MOUTH 2 TIMES DAILY WITH A MEAL. 180 tablet 0  . Cholecalciferol (VITAMIN D) 50 MCG (2000 UT) CAPS Take 1 capsule (2,000 Units total) by mouth daily. 30 capsule   . citalopram (CELEXA) 10 MG tablet TAKE 1 TABLET BY MOUTH EVERY DAY 90 tablet 2  . Cyanocobalamin (B-12) 1000 MCG SUBL Place 1 tablet under the tongue daily.    .Marland Kitchen  ferrous sulfate 325 (65 FE) MG tablet Take 650 mg by mouth daily with breakfast.    . fluticasone (FLONASE) 50 MCG/ACT nasal spray SPRAY 2 SPRAYS INTO EACH NOSTRIL EVERY DAY (Patient taking differently: As needed) 16 mL 3  . furosemide (LASIX) 20 MG tablet TAKE 1 TABLET (20 MG TOTAL) BY MOUTH 2 (TWO) TIMES DAILY AS NEEDED. 180 tablet 0  . glimepiride (AMARYL) 4 MG tablet Take 1 tablet (4 mg total) by mouth daily with breakfast. 90 tablet 3  . hydrOXYzine (ATARAX/VISTARIL) 50 MG tablet TAKE 1 TABLET (50  MG TOTAL) BY MOUTH 2 (TWO) TIMES DAILY AS NEEDED FOR ANXIETY. 180 tablet 1  . metFORMIN (GLUCOPHAGE) 1000 MG tablet TAKE 1 TABLET (1,000 MG TOTAL) BY MOUTH 2 (TWO) TIMES DAILY WITH A MEAL. 180 tablet 0  . montelukast (SINGULAIR) 10 MG tablet TAKE 1 TABLET BY MOUTH EVERYDAY AT BEDTIME 90 tablet 0  . ONETOUCH VERIO test strip USE AS INSTRUCTED TO CHECK THREE TIMES DAILY AND AS NEEDED. E11.8 300 strip 3  . pantoprazole (PROTONIX) 40 MG tablet TAKE 1 TABLET (40 MG TOTAL) BY MOUTH 2 (TWO) TIMES DAILY BEFORE A MEAL. 180 tablet 0  . Probiotic Product (PROBIOTIC DAILY PO) Take by mouth.    . simvastatin (ZOCOR) 20 MG tablet TAKE 1 TABLET BY MOUTH EVERY DAY IN THE EVENING 90 tablet 2  . sucralfate (CARAFATE) 1 g tablet Take 1 tablet (1 g total) by mouth 3 (three) times daily before meals. (Patient not taking: Reported on 01/04/2020) 60 tablet 0  . triamcinolone cream (KENALOG) 0.1 % Apply 1 application topically 2 (two) times daily. Apply to AA. 80 g 0  . valACYclovir (VALTREX) 1000 MG tablet Take 1 tablet (1,000 mg total) by mouth 3 (three) times daily. 21 tablet 0   No facility-administered medications prior to visit.     Per HPI unless specifically indicated in ROS section below Review of Systems Objective:  BP 140/72 (BP Location: Left Arm, Patient Position: Sitting, Cuff Size: Large)   Pulse 74   Temp 97.7 F (36.5 C) (Temporal)   Ht 5' 10" (1.778 m)   Wt 275 lb 9 oz (125 kg)   SpO2 90%   BMI 39.54 kg/m   Wt Readings from Last 3 Encounters:  01/04/20 275 lb 9 oz (125 kg)  10/12/19 282 lb (127.9 kg)  10/02/19 281 lb 5 oz (127.6 kg)      Physical Exam Vitals and nursing note reviewed.  Constitutional:      Appearance: Normal appearance. He is not ill-appearing.  Cardiovascular:     Rate and Rhythm: Normal rate and regular rhythm.     Pulses: Normal pulses.     Heart sounds: Normal heart sounds. No murmur heard.   Pulmonary:     Effort: Pulmonary effort is normal. No respiratory  distress.     Breath sounds: Wheezing (coarse) and rhonchi present. No rales.     Comments: Bibasilar crackles Abdominal:     General: Abdomen is flat. Bowel sounds are normal. There is no distension.     Palpations: Abdomen is soft. There is no mass.     Tenderness: There is no abdominal tenderness. There is no right CVA tenderness, left CVA tenderness or guarding.     Hernia: No hernia is present.  Musculoskeletal:     Right lower leg: No edema.     Left lower leg: No edema.  Neurological:     Mental Status: He is alert.  Results for orders placed or performed in visit on 01/04/20  Hemoglobin A1c  Result Value Ref Range   Hgb A1c MFr Bld 7.8 (H) 4.6 - 6.5 %  Comprehensive metabolic panel  Result Value Ref Range   Sodium 140 135 - 145 mEq/L   Potassium 4.5 3.5 - 5.1 mEq/L   Chloride 97 96 - 112 mEq/L   CO2 36 (H) 19 - 32 mEq/L   Glucose, Bld 169 (H) 70 - 99 mg/dL   BUN 10 6 - 23 mg/dL   Creatinine, Ser 0.70 0.40 - 1.50 mg/dL   Total Bilirubin 0.5 0.2 - 1.2 mg/dL   Alkaline Phosphatase 64 39 - 117 U/L   AST 38 (H) 0 - 37 U/L   ALT 30 0 - 53 U/L   Total Protein 6.9 6.0 - 8.3 g/dL   Albumin 4.6 3.5 - 5.2 g/dL   GFR 113.43 >60.00 mL/min   Calcium 9.9 8.4 - 10.5 mg/dL  CBC with Differential/Platelet  Result Value Ref Range   WBC 10.3 4.0 - 10.5 K/uL   RBC 5.31 4.22 - 5.81 Mil/uL   Hemoglobin 14.7 13.0 - 17.0 g/dL   HCT 45.2 39 - 52 %   MCV 85.0 78.0 - 100.0 fl   MCHC 32.6 30.0 - 36.0 g/dL   RDW 16.2 (H) 11.5 - 15.5 %   Platelets 222.0 150 - 400 K/uL   Neutrophils Relative % 63.7 43 - 77 %   Lymphocytes Relative 24.2 12 - 46 %   Monocytes Relative 10.3 3 - 12 %   Eosinophils Relative 1.4 0 - 5 %   Basophils Relative 0.4 0 - 3 %   Neutro Abs 6.6 1.4 - 7.7 K/uL   Lymphs Abs 2.5 0.7 - 4.0 K/uL   Monocytes Absolute 1.1 (H) 0 - 1 K/uL   Eosinophils Absolute 0.1 0 - 0 K/uL   Basophils Absolute 0.0 0 - 0 K/uL   Assessment & Plan:  This visit occurred during the  SARS-CoV-2 public health emergency.  Safety protocols were in place, including screening questions prior to the visit, additional usage of staff PPE, and extensive cleaning of exam room while observing appropriate contact time as indicated for disinfecting solutions.   Problem List Items Addressed This Visit    Umbilical hernia without obstruction and without gangrene   Severe obesity (BMI 35.0-39.9) with comorbidity ([place])    7 lb weight loss since last visit.       Right shoulder pain    Predominant paresthesias to R posterior shoulder currently without pain or weakness in known marked cervical DDD - anticipate related to this. Pt declines further eval at this time until he completes GI and cardiac evaluations.       Mediastinal mass    Thought benign cyst - will call to schedule thoracic surgery f/u Faith Rogue)      Iron deficiency anemia    Update CBC. He has dropped iron to once daily due to worsening constipation.       Hypertension    Chronic, adequate      Hyperlipidemia associated with type 2 diabetes mellitus (HCC)    Chronic, continues zocor.       Gassiness    Continue PPI BID, probiotic, add carafate  ?SIBO Encouraged GI f/u.       Epigastric abdominal pain - Primary    Ongoing abdominal pain with gassiness, bloating, loose stools anticipate contributing to chest tightness and dyspnea. Did not complete GI workup recommended - #  provided to reschedule recommended EGD/colonoscopy. In interim, restart carafate.       Relevant Orders   Comprehensive metabolic panel (Completed)   CBC with Differential/Platelet (Completed)   Diabetes mellitus type 2, uncontrolled, with complications ([place])    Update A1c on glimepiride, metformin 1071m bid.       Relevant Orders   Hemoglobin A1c (Completed)   DDD (degenerative disc disease), cervical   Coronary artery disease   Chronic systolic CHF (congestive heart failure) (HCC)    Bibasilar crackles without pedal edema, overall  seems euvolemic.  Continues lasix 429m20mg daily.       Chest pain    Encouraged he call to reschedule Sestamibi exercise test recommended by cardiology.       Atrial fibrillation and flutter ([place]   Predominant h/o atrial flutter s/p ablation 2013. Not currently on anticoagulation besides aspirin 8145maily. Recent holter monitor normal.        Other Visit Diagnoses    Need for influenza vaccination       Relevant Orders   Flu Vaccine QUAD 36+ mos IM (Completed)       Meds ordered this encounter  Medications  . sucralfate (CARAFATE) 1 g tablet    Sig: Take 1 tablet (1 g total) by mouth 3 (three) times daily before meals.    Dispense:  90 tablet    Refill:  1   Orders Placed This Encounter  Procedures  . Flu Vaccine QUAD 36+ mos IM  . Hemoglobin A1c  . Comprehensive metabolic panel  . CBC with Differential/Platelet    Patient Instructions  Flu shot today I will ask our care coordinator to call you to look into transportation assistance.  Restart carafate to protect stomach. Continue pantoprazole twice daily.  Ok to hold iron for now to see if any improvement in constipation.  Update A1c today.  Call GI ([facility]) [phone] cards to follow up on planned studies.    Follow up plan: Return if symptoms worsen or fail to improve.  JavRia BushD

## 2020-01-04 NOTE — Patient Instructions (Addendum)
Flu shot today I will ask our care coordinator to call you to look into transportation assistance.  Restart carafate to protect stomach. Continue pantoprazole twice daily.  Ok to hold iron for now to see if any improvement in constipation.  Update A1c today.  Call GI ([facility]) [phone] and cards to follow up on planned studies.

## 2020-01-05 ENCOUNTER — Encounter: Payer: Self-pay | Admitting: Family Medicine

## 2020-01-05 ENCOUNTER — Telehealth: Payer: Self-pay | Admitting: Family Medicine

## 2020-01-05 DIAGNOSIS — J9859 Other diseases of mediastinum, not elsewhere classified: Secondary | ICD-10-CM | POA: Insufficient documentation

## 2020-01-05 MED ORDER — SUCRALFATE 1 G PO TABS
1.0000 g | ORAL_TABLET | Freq: Three times a day (TID) | ORAL | 1 refills | Status: DC
Start: 2020-01-05 — End: 2020-02-29

## 2020-01-05 NOTE — Telephone Encounter (Signed)
Patient has difficulty with transportation to medical appointments/ planned procedures including possible EGD/colonoscopy and exercise stress test.  Can we touch base on transportation assiatance options for this patient? Thank you. I have also placed referral to [facility] but not sure if that is the correct thing to do in this case.

## 2020-01-05 NOTE — Assessment & Plan Note (Signed)
Chronic, adequate °

## 2020-01-05 NOTE — Assessment & Plan Note (Signed)
Chronic, continues zocor.

## 2020-01-05 NOTE — Assessment & Plan Note (Signed)
7 lb weight loss since last visit.

## 2020-01-05 NOTE — Assessment & Plan Note (Signed)
Update A1c on glimepiride, metformin 1000mg  bid.

## 2020-01-05 NOTE — Assessment & Plan Note (Signed)
Thought benign cyst - will call to schedule thoracic surgery f/u Faith Rogue)

## 2020-01-05 NOTE — Assessment & Plan Note (Signed)
Update CBC. He has dropped iron to once daily due to worsening constipation.

## 2020-01-05 NOTE — Assessment & Plan Note (Signed)
Continue PPI BID, probiotic, add carafate  ?SIBO Encouraged GI f/u.

## 2020-01-05 NOTE — Assessment & Plan Note (Signed)
Encouraged he call to reschedule Sestamibi exercise test recommended by cardiology.

## 2020-01-05 NOTE — Assessment & Plan Note (Signed)
Bibasilar crackles without pedal edema, overall seems euvolemic.  Continues lasix 40mg /20mg  daily.

## 2020-01-05 NOTE — Assessment & Plan Note (Signed)
Ongoing abdominal pain with gassiness, bloating, loose stools anticipate contributing to chest tightness and dyspnea. Did not complete GI workup recommended - # provided to reschedule recommended EGD/colonoscopy. In interim, restart carafate.

## 2020-01-05 NOTE — Assessment & Plan Note (Addendum)
Predominant h/o atrial flutter s/p ablation 2013. Not currently on anticoagulation besides aspirin 81mg  daily. Recent holter monitor normal.

## 2020-01-05 NOTE — Assessment & Plan Note (Addendum)
Predominant paresthesias to R posterior shoulder currently without pain or weakness in known marked cervical DDD - anticipate related to this. Pt declines further eval at this time until he completes GI and cardiac evaluations.

## 2020-01-09 ENCOUNTER — Other Ambulatory Visit: Payer: Self-pay

## 2020-01-11 ENCOUNTER — Other Ambulatory Visit: Payer: Self-pay

## 2020-01-15 ENCOUNTER — Other Ambulatory Visit: Payer: Self-pay | Admitting: Family Medicine

## 2020-01-15 MED ORDER — EMPAGLIFLOZIN 10 MG PO TABS: 10.0000 mg | ORAL_TABLET | Freq: Every day | ORAL | 6 refills | Status: DC

## 2020-01-21 ENCOUNTER — Other Ambulatory Visit: Payer: Self-pay | Admitting: Family Medicine

## 2020-01-27 ENCOUNTER — Other Ambulatory Visit: Payer: Self-pay | Admitting: Family Medicine

## 2020-02-08 ENCOUNTER — Emergency Department
Admission: EM | Admit: 2020-02-08 | Discharge: 2020-02-08 | Disposition: A | Discharge status: Home / Self Care | Payer: Medicare Other | Attending: Emergency Medicine | Admitting: Emergency Medicine

## 2020-02-08 ENCOUNTER — Other Ambulatory Visit: Payer: Self-pay

## 2020-02-08 ENCOUNTER — Telehealth: Payer: Self-pay

## 2020-02-08 ENCOUNTER — Emergency Department: Payer: Medicare Other

## 2020-02-08 DIAGNOSIS — Z7982 Long term (current) use of aspirin: Secondary | ICD-10-CM | POA: Diagnosis not present

## 2020-02-08 DIAGNOSIS — R0789 Other chest pain: Secondary | ICD-10-CM | POA: Diagnosis not present

## 2020-02-08 DIAGNOSIS — I110 Hypertensive heart disease with heart failure: Secondary | ICD-10-CM | POA: Insufficient documentation

## 2020-02-08 DIAGNOSIS — J449 Chronic obstructive pulmonary disease, unspecified: Secondary | ICD-10-CM | POA: Insufficient documentation

## 2020-02-08 DIAGNOSIS — R0609 Other forms of dyspnea: Secondary | ICD-10-CM | POA: Insufficient documentation

## 2020-02-08 DIAGNOSIS — R079 Chest pain, unspecified: Secondary | ICD-10-CM | POA: Diagnosis not present

## 2020-02-08 DIAGNOSIS — R0602 Shortness of breath: Secondary | ICD-10-CM | POA: Diagnosis not present

## 2020-02-08 DIAGNOSIS — E119 Type 2 diabetes mellitus without complications: Secondary | ICD-10-CM | POA: Diagnosis not present

## 2020-02-08 DIAGNOSIS — I5022 Chronic systolic (congestive) heart failure: Secondary | ICD-10-CM | POA: Diagnosis not present

## 2020-02-08 DIAGNOSIS — Z79899 Other long term (current) drug therapy: Secondary | ICD-10-CM | POA: Insufficient documentation

## 2020-02-08 DIAGNOSIS — R1013 Epigastric pain: Secondary | ICD-10-CM | POA: Diagnosis not present

## 2020-02-08 DIAGNOSIS — I2510 Atherosclerotic heart disease of native coronary artery without angina pectoris: Secondary | ICD-10-CM | POA: Diagnosis not present

## 2020-02-08 DIAGNOSIS — R0600 Dyspnea, unspecified: Secondary | ICD-10-CM | POA: Diagnosis not present

## 2020-02-08 DIAGNOSIS — J90 Pleural effusion, not elsewhere classified: Secondary | ICD-10-CM | POA: Diagnosis not present

## 2020-02-08 DIAGNOSIS — Z7984 Long term (current) use of oral hypoglycemic drugs: Secondary | ICD-10-CM | POA: Insufficient documentation

## 2020-02-08 DIAGNOSIS — J9811 Atelectasis: Secondary | ICD-10-CM | POA: Diagnosis not present

## 2020-02-08 DIAGNOSIS — Z87891 Personal history of nicotine dependence: Secondary | ICD-10-CM | POA: Insufficient documentation

## 2020-02-08 LAB — CBC
HCT: 41.6 % (ref 39.0–52.0)
Hemoglobin: 13.1 g/dL (ref 13.0–17.0)
MCH: 27.2 pg (ref 26.0–34.0)
MCHC: 31.5 g/dL (ref 30.0–36.0)
MCV: 86.3 fL (ref 80.0–100.0)
Platelets: 224 10*3/uL (ref 150–400)
RBC: 4.82 MIL/uL (ref 4.22–5.81)
RDW: 13.8 % (ref 11.5–15.5)
WBC: 9.5 10*3/uL (ref 4.0–10.5)
nRBC: 0 % (ref 0.0–0.2)

## 2020-02-08 LAB — COMPREHENSIVE METABOLIC PANEL
ALT: 21 U/L (ref 0–44)
AST: 28 U/L (ref 15–41)
Albumin: 4 g/dL (ref 3.5–5.0)
Alkaline Phosphatase: 51 U/L (ref 38–126)
Anion gap: 11 (ref 5–15)
BUN: 8 mg/dL (ref 8–23)
CO2: 32 mmol/L (ref 22–32)
Calcium: 8.9 mg/dL (ref 8.9–10.3)
Chloride: 96 mmol/L — ABNORMAL LOW (ref 98–111)
Creatinine, Ser: 0.55 mg/dL — ABNORMAL LOW (ref 0.61–1.24)
GFR, Estimated: >60 mL/min (ref >60)
Glucose, Bld: 162 mg/dL — ABNORMAL HIGH (ref 70–99)
Potassium: 4 mmol/L (ref 3.5–5.1)
Sodium: 139 mmol/L (ref 135–145)
Total Bilirubin: 0.6 mg/dL (ref 0.3–1.2)
Total Protein: 7.1 g/dL (ref 6.5–8.1)

## 2020-02-08 LAB — TROPONIN I (HIGH SENSITIVITY): Troponin I (High Sensitivity): 8 ng/L (ref <18)

## 2020-02-08 LAB — LIPASE, BLOOD: Lipase: 28 U/L (ref 11–51)

## 2020-02-08 NOTE — Telephone Encounter (Signed)
[place] Day - Client TELEPHONE ADVICE RECORD [facility] Patient Name: [name] [facility] Gender: Male DOB: [birth year] Age: 64 Y 25 M 24 D Return Phone Number: [phone] (Primary) Address: City/State/Zip: [place] [address] Client [facility] Day - Client Client Site [place] Physician [name] - MD Contact Type Call Who Is Calling Patient / Member / Family / Caregiver Call Type Triage / Clinical Relationship To Patient Self Return Phone Number [phone] (Primary) Chief Complaint BREATHING - shortness of breath or sounds breathless Reason for Call Symptomatic / Request for [place] states he is having severe abdominal cramping and difficulty breathing. [place] Not Listed [place] Translation No Nurse Assessment Nurse: Kathi Ludwig, RN, Leana Roe Date/Time (Eastern Time): 02/08/2020 9:09:47 AM Confirm and document reason for call. If symptomatic, describe symptoms. ---Caller states severe abd pain and dyspnea for a month. no vomiting , diarrhea, or fever. no other sx. stomach bloated and hard Does the patient have any new or worsening symptoms? ---Yes Will a triage be completed? ---Yes Related visit to physician within the last 2 weeks? ---No Does the PT have any chronic conditions? (i.e. diabetes, asthma, this includes High risk factors for pregnancy, etc.) ---Yes List chronic conditions. ---DM Is this a behavioral health or substance abuse call? ---No Guidelines Guideline Title Affirmed Question Affirmed Notes Nurse Date/Time (Eastern Time) Abdominal Pain - Male [1] SEVERE pain (e.g., excruciating) AND [2] present > 1 hour Kathi Ludwig, RN, Tracie 02/08/2020 9:11:07 AM Disp. Time Eilene Ghazi Time) Disposition Final User 02/08/2020 9:06:42 AM Send to Urgent Queue Jamal Maes 02/08/2020 9:11:41 AM Go to ED Now Yes Kathi Ludwig, RN, Leana Roe PLEASE  NOTE: All timestamps contained within this report are represented as [place] Standard Time. CONFIDENTIALTY NOTICE: This fax transmission is intended only for the addressee. It contains information that is legally privileged, confidential or otherwise protected from use or disclosure. If you are not the intended recipient, you are strictly prohibited from reviewing, disclosing, copying using or disseminating any of this information or taking any action in reliance on or regarding this information. If you have received this fax in error, please notify us immediately by telephone so that we can arrange for its return to [place]. Phone: [phone], Toll-Free: [phone], Fax: [phone] Page: 2 of 2 Call Id: [id number] [place] Disagree/Comply Comply Caller Understands Yes PreDisposition Did not know what to do Care Advice Given Per Guideline GO TO ED NOW: * You need to be seen in the Emergency Department. * Go to the ED at ___________ [place] now. Drive carefully. ANOTHER ADULT SHOULD DRIVE: * It is better and safer if another adult drives instead of you. NOTHING BY MOUTH: * Do not eat or drink anything for now. BRING MEDICINES: * Bring a list of your current medicines when you go to the Emergency Department (ER). CARE ADVICE given per Abdominal Pain, Male (Adult) guideline. Referrals GO TO FACILITY OTHER - SPECIFY

## 2020-02-08 NOTE — ED Triage Notes (Signed)
Reports recurrent pain that begins in stomach, radiates up into chest and makes it difficulty to breathe for last month. Reports it occurred today. States increased SOB with walking. Pt alert and oriented X4, cooperative, RR even and unlabored, color WNL. Pt in NAD.

## 2020-02-08 NOTE — Telephone Encounter (Signed)
Per chart review tab pt is at [facility] ED. 

## 2020-02-08 NOTE — ED Provider Notes (Signed)
[facility] Emergency Department Provider Note   ____________________________________________   First MD Initiated Contact with Patient 02/08/20 1405     (approximate)  I have reviewed the triage vital signs and the nursing notes.   HISTORY  Chief Complaint Abdominal Pain, Chest Pain, and Shortness of Breath    HPI [name] is a 64 y.o. male with a stated past medical history of type 2 diabetes, COPD, OSA, and CHF who presents for intermittent midepigastric and substernal chest pain with associated shortness of breath and dyspnea on exertion.  Patient states that every time he eats anything he feels significant bloating in his abdomen with a faint aching/burning sensation to his mid epigastric region that radiates up into the substernal chest and is worsened with deep inspiration or exertion.  Patient states that sitting straight up partially relieves this pain.  Patient has been taking simethicone and sucralfate for the symptoms with minimal relief.  Patient states he has tried to schedule an EGD but was told that he needs a cardiac stress test first which could not be scheduled until the middle of November.         Past Medical History:  Diagnosis Date  . Abnormal drug screen 2015   MJ positive x3 - one more and we will stop prescribing ativan (08/2013).  . Angina   . Anxiety   . Arthritis   . CAD (coronary artery disease)    nonobstructive  . Cannabis abuse   . Chronic systolic CHF (congestive heart failure) ([place]) 2013   NYHA Class II/III  . COPD (chronic obstructive pulmonary disease) ([place])   . Diabetes type 2, uncontrolled ([place])    declines DSME  . Dilated cardiomyopathy ([place]) 2013   now improved  . Ex-smoker   . Frequent headaches   . History of atrial fibrillation 2013   chronic, s/p ablation prior on coumadin  . Hyperlipidemia   . Hypertension   . Migraine   . Nonischemic cardiomyopathy ([place]) 2013   EF 25% per Dr Nehemiah Massed  .  Obesity   . OSA (obstructive sleep apnea)    does not use CPAP - unable to tolerate  . Seasonal allergies     Patient Active Problem List   Diagnosis Date Noted  . Mediastinal mass 01/05/2020  . Skin rash 10/12/2019  . Vitamin D deficiency 04/19/2019  . Low serum vitamin B12 04/19/2019  . Iron deficiency anemia 01/02/2019  . Personal history of tobacco use, presenting hazards to health 11/07/2018  . Epigastric abdominal pain 09/19/2018  . Chronic bronchitis ([place]) 07/25/2018  . Right sided facial pain (Secondary Area of Pain) 01/05/2018  . Chronic neck pain [facility] Area of Pain) (R>L) 01/05/2018  . Chronic upper extremity pain (Fourth Area of Pain) (R>L) 01/05/2018  . Chronic upper back pain 01/05/2018  . Chronic bilateral low back pain without sciatica 01/05/2018  . Chronic pain syndrome 01/05/2018  . Right-sided headache 10/17/2017  . Right leg weakness 07/14/2017  . AAA (abdominal aortic aneurysm) without rupture ([place]) 09/15/2016  . Adrenal adenoma, left 08/13/2016  . Nodule of right lung 08/13/2016  . Fatty liver 08/01/2016  . Right flank pain 07/26/2016  . 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 Right shoulder pain 07/26/2016  . Restrictive lung disease 06/04/2016  . Chronic systolic CHF (congestive heart failure) ([place])   . Tinea pedis 06/30/2015  . Gassiness 02/21/2015  . Advanced care planning/counseling discussion 10/16/2014  . Health maintenance examination 10/16/2014  . Diabetes mellitus  type 2, uncontrolled, with complications ([place]) 08/81/1031  . Medicare annual wellness visit, subsequent 09/03/2013  . Umbilical hernia without obstruction and without gangrene 09/03/2013  . Left flank mass 09/03/2013  . Hyperlipidemia associated with type 2 diabetes mellitus ([place]) 06/29/2013  . Coronary artery disease 06/29/2013  . Abnormal toxicological findings 06/24/2013  . Exertional dyspnea 06/14/2013  . Malaise and fatigue 03/14/2013  . Chest pain 02/07/2013  .  Hypertension   . Panic attacks   . Atrial fibrillation and flutter ([place]) 07/07/2011  . Severe obesity (BMI 35.0-39.9) with comorbidity ([place]) 07/07/2011  . Ex-smoker 08/13/2009  . Sleep apnea, central 08/13/2009  . DDD (degenerative disc disease), cervical 08/13/2009  . Allergic rhinitis 06/24/2009    Past Surgical History:  Procedure Laterality Date  . ATRIAL FLUTTER ABLATION N/A 09/21/2011   Procedure: ATRIAL FLUTTER ABLATION;  Surgeon: Thompson Grayer, MD;  Location: [facility] CATH LAB;  Service: Cardiovascular;  Laterality: N/A;  . CARDIAC ELECTROPHYSIOLOGY [place] AND ABLATION  2013   for atrial flutter  . CARDIOVASCULAR STRESS TEST  03/2012   ETT WNL Nehemiah Massed)  . US ECHOCARDIOGRAPHY  2014   EF 50%, nl LV fxn, RV nl size/function, mild mitral insuff    Prior to Admission medications   Medication Sig Start Date End Date Taking? Authorizing Provider  ACCU-CHEK FASTCLIX LANCETS MISC Check blood sugar once daily and as instructed. Dx 250.00 05/10/13   [name], MD  albuterol (PROVENTIL) (2.5 MG/3ML) 0.083% nebulizer solution Take 3 mLs (2.5 mg total) by nebulization every 6 (six) hours as needed for wheezing or shortness of breath. 10/02/19   [name], MD  albuterol (VENTOLIN HFA) 108 (90 Base) MCG/ACT inhaler INHALE 2 PUFFS BY MOUTH EVERY 6 HOURS AS NEEDED FOR WHEEZE OR SHORTNESS OF BREATH 12/10/19   [name], MD  aspirin EC 81 MG tablet Take 81 mg by mouth daily.    [provider]  Blood Glucose Monitoring Suppl (BLOOD GLUCOSE MONITOR SYSTEM) W/DEVICE KIT by Does not apply route. Use to check sugar once daily and as needed Dx: E11.9 **ONE TOUCH VERIO**    [provider]  carvedilol (COREG) 6.25 MG tablet TAKE 1 TABLET BY MOUTH 2 TIMES DAILY WITH A MEAL. 01/29/20   [name], MD  Cholecalciferol (VITAMIN D) 50 MCG (2000 UT) CAPS Take 1 capsule (2,000 Units total) by mouth daily. 02/05/19   [name], MD  citalopram (CELEXA) 10 MG tablet  TAKE 1 TABLET BY MOUTH EVERY DAY 06/28/19   [name], MD  Cyanocobalamin (B-12) 1000 MCG SUBL Place 1 tablet under the tongue daily. 02/05/19   [name], MD  empagliflozin (JARDIANCE) 10 MG TABS tablet Take 1 tablet (10 mg total) by mouth daily before breakfast. 01/15/20   [name], MD  ferrous sulfate 325 (65 FE) MG tablet Take 650 mg by mouth daily with breakfast.    [provider]  fluticasone (FLONASE) 50 MCG/ACT nasal spray SPRAY 2 SPRAYS INTO EACH NOSTRIL EVERY DAY Patient taking differently: As needed 05/04/19   [name], MD  furosemide (LASIX) 20 MG tablet TAKE 1 TABLET (20 MG TOTAL) BY MOUTH 2 (TWO) TIMES DAILY AS NEEDED. 12/24/19   [name], MD  glimepiride (AMARYL) 4 MG tablet Take 1 tablet (4 mg total) by mouth daily with breakfast. 10/02/19   [name], MD  hydrOXYzine (ATARAX/VISTARIL) 50 MG tablet TAKE 1 TABLET (50 MG TOTAL) BY MOUTH 2 (TWO) TIMES DAILY AS NEEDED FOR ANXIETY. 11/23/19   [name], MD  metFORMIN (  GLUCOPHAGE) 1000 MG tablet TAKE 1 TABLET (1,000 MG TOTAL) BY MOUTH 2 (TWO) TIMES DAILY WITH A MEAL. 12/06/19   [name], MD  montelukast (SINGULAIR) 10 MG tablet TAKE 1 TABLET BY MOUTH EVERYDAY AT BEDTIME 12/28/19   [name], MD  [facility] VERIO test strip USE AS INSTRUCTED TO CHECK THREE TIMES DAILY AND AS NEEDED. E11.8 12/10/19   [name], MD  pantoprazole (PROTONIX) 40 MG tablet TAKE 1 TABLET (40 MG TOTAL) BY MOUTH 2 (TWO) TIMES DAILY BEFORE A MEAL. 12/24/19   [name], MD  Probiotic Product (PROBIOTIC DAILY PO) Take by mouth.    [provider]  simvastatin (ZOCOR) 20 MG tablet TAKE 1 TABLET BY MOUTH EVERY DAY IN THE EVENING 07/19/19   [name], MD  sucralfate (CARAFATE) 1 g tablet Take 1 tablet (1 g total) by mouth 3 (three) times daily before meals. 01/05/20   [name], MD    Allergies Atorvastatin and Lisinopril  Family History  Problem Relation Age  of Onset  . Heart failure Father 31  . Cancer Mother 2       NHL  . Hypertension Maternal Grandfather   . CAD Maternal Grandfather 103       MI  . Diabetes Other        grandparents  . Cancer Brother 48       lung (smoker)    Social History Social History   Tobacco Use  . Smoking status: Former Smoker    Packs/day: 1.00    Years: 31.00    Pack years: 31.00    Types: Cigarettes    Quit date: 04/26/2018    Years since quitting: 1.7  . Smokeless tobacco: Never Used  Substance Use Topics  . Alcohol use: No    Alcohol/week: 0.0 standard drinks  . Drug use: Yes    Types: Marijuana    Review of Systems Constitutional: No fever/chills Eyes: No visual changes. ENT: No sore throat. Cardiovascular: Endorses chest pain. Respiratory: Endorses shortness of breath. Gastrointestinal: Endorses abdominal pain.  No nausea, no vomiting.  No diarrhea. Genitourinary: Negative for dysuria. Musculoskeletal: Negative for acute arthralgias Skin: Negative for rash. Neurological: Negative for headaches, weakness/numbness/paresthesias in any extremity Psychiatric: Negative for suicidal ideation/homicidal ideation   ____________________________________________   PHYSICAL EXAM:  VITAL SIGNS: ED Triage Vitals [02/08/20 1123]  Enc Vitals Group     BP (!) 153/91     Pulse Rate 66     Resp 20     Temp 98.2 F (36.8 C)     Temp Source Oral     SpO2 94 %     Weight 272 lb (123.4 kg)     Height _0  (1.803 m)     Head Circumference      Peak Flow      Pain Score 2     Pain Loc      Pain Edu?      Excl. in [place]?    Constitutional: Alert and oriented.  Obese, well appearing and in no acute distress. Eyes: Conjunctivae are normal. PERRL. Head: Atraumatic. Nose: No congestion/rhinnorhea. Mouth/Throat: Mucous membranes are moist. Neck: No stridor Cardiovascular: Grossly normal heart sounds.  Good peripheral circulation. Respiratory: Normal respiratory effort.  No  retractions. Gastrointestinal: Soft and nontender. No distention. Musculoskeletal: No obvious deformities Neurologic:  Normal speech and language. No gross focal neurologic deficits are appreciated. Skin:  Skin is warm and dry. No rash noted. Psychiatric: Mood and affect are normal. Speech and behavior  are normal.  ____________________________________________   LABS (all labs ordered are listed, but only abnormal results are displayed)  Labs Reviewed  COMPREHENSIVE METABOLIC PANEL - Abnormal; Notable for the following components:      Result Value   Chloride 96 (*)    Glucose, Bld 162 (*)    Creatinine, Ser 0.55 (*)    All other components within normal limits  CBC  LIPASE, BLOOD  TROPONIN I (HIGH SENSITIVITY)   ____________________________________________  EKG  ED ECG REPORT I, Naaman Plummer, the attending physician, personally viewed and interpreted this ECG.  Date: 02/08/2020 EKG Time: 1125 Rate: 69 Rhythm: normal sinus rhythm QRS Axis: normal Intervals: normal ST/T Wave abnormalities: normal Narrative Interpretation: no evidence of acute ischemia.  PVC  ____________________________________________  RADIOLOGY  ED MD interpretation: 2 view x-ray of the chest shows no evidence of acute pneumonia, pneumothorax, or widened mediastinum show chronic right basilar scarring and left axis  Official radiology report(s): DG Chest 2 View  Result Date: 02/08/2020 CLINICAL DATA:  Chest pain EXAM: CHEST - 2 VIEW COMPARISON:  10/02/2019 chest radiograph and prior. FINDINGS: The heart size and mediastinal contours are within normal limits. Right basilar scarring. Left basilar opacities, likely atelectasis. No pneumothorax or pleural effusion. No acute osseous abnormality. IMPRESSION: No focal airspace disease. Right basilar scarring and left basilar atelectasis. Electronically Signed   By: Primitivo Gauze M.D.   On: 02/08/2020 12:27     ____________________________________________   PROCEDURES  Procedure(s) performed (including Critical Care):  Procedures   ____________________________________________   INITIAL IMPRESSION / ASSESSMENT AND PLAN / ED COURSE  As part of my medical decision making, I reviewed the following data within the [facility] notes reviewed and incorporated, Labs reviewed, EKG interpreted, Old chart reviewed, Radiograph reviewed and Notes from prior ED visits reviewed and incorporated        Workup: ECG, CXR, CBC, BMP, Troponin Findings: ECG: No overt evidence of STEMI. No evidence of Brugadas sign, delta wave, epsilon wave, significantly prolonged QTc, or malignant arrhythmia HS Troponin: Negative x1 Other Labs unremarkable for emergent problems. CXR: Without PTX, PNA, or widened mediastinum Last Stress Test: Unknown Last Heart Catheterization: 07/15/2010 HEART Score: 4  Given History, Exam, and Workup I have low suspicion for ACS, Pneumothorax, Pneumonia, Pulmonary Embolus, Tamponade, Aortic Dissection or other emergent problem as a cause for this presentation.   Reassesment: Prior to discharge patients pain was controlled and they were well appearing.  Disposition:  Discharge. Strict return precautions discussed with patient with full understanding. Advised patient to follow up promptly with primary care provider      ____________________________________________   FINAL CLINICAL IMPRESSION(S) / ED DIAGNOSES  Final diagnoses:  Midepigastric pain  Dyspnea on exertion  Atypical chest pain     ED Discharge Orders    None       Note:  This document was prepared using Dragon voice recognition software and may include unintentional dictation errors.   Naaman Plummer, MD 02/08/20 [phone]

## 2020-02-11 NOTE — Telephone Encounter (Signed)
Seen at ER with reassuring evaluation.

## 2020-02-25 ENCOUNTER — Encounter: Payer: Self-pay | Admitting: Family Medicine

## 2020-02-25 ENCOUNTER — Ambulatory Visit (INDEPENDENT_AMBULATORY_CARE_PROVIDER_SITE_OTHER): Payer: Medicare Other | Admitting: Family Medicine

## 2020-02-25 ENCOUNTER — Other Ambulatory Visit: Payer: Self-pay

## 2020-02-25 VITALS — BP 118/76 | HR 73 | Temp 97.5°F | Ht 71.0 in | Wt 290.1 lb

## 2020-02-25 DIAGNOSIS — I5022 Chronic systolic (congestive) heart failure: Secondary | ICD-10-CM | POA: Diagnosis not present

## 2020-02-25 DIAGNOSIS — R600 Localized edema: Secondary | ICD-10-CM | POA: Insufficient documentation

## 2020-02-25 DIAGNOSIS — M542 Cervicalgia: Secondary | ICD-10-CM | POA: Insufficient documentation

## 2020-02-25 DIAGNOSIS — R1013 Epigastric pain: Secondary | ICD-10-CM

## 2020-02-25 DIAGNOSIS — I714 Abdominal aortic aneurysm, without rupture, unspecified: Secondary | ICD-10-CM

## 2020-02-25 DIAGNOSIS — R21 Rash and other nonspecific skin eruption: Secondary | ICD-10-CM | POA: Diagnosis not present

## 2020-02-25 DIAGNOSIS — R140 Abdominal distension (gaseous): Secondary | ICD-10-CM | POA: Diagnosis not present

## 2020-02-25 MED ORDER — FUROSEMIDE 40 MG PO TABS
40.0000 mg | ORAL_TABLET | Freq: Two times a day (BID) | ORAL | 0 refills | Status: DC
Start: 2020-02-25 — End: 2020-05-21

## 2020-02-25 NOTE — Assessment & Plan Note (Signed)
Anticipate related to enlarged LN of a few days duration. Will watch for now, update if persists.

## 2020-02-25 NOTE — Patient Instructions (Addendum)
Increase lasix to 40mg  twice daily. Return on Friday for lab visit only.  Try lotrimin (clotrimazole) antifungal twice daily to rash to feet.  Caution with himalayan salts - I worry this may be increasing sodium in the body causing some of this swelling and weight gain.  May try nebulizing normal saline for shortness of breath symptoms instead of himalayan salt.  Keep upcoming cardiology appointments.  Return in 3 months for physical/wellness visit

## 2020-02-25 NOTE — Assessment & Plan Note (Signed)
Pending EGD, pending cards clearance prior (scheduled for end of November).

## 2020-02-25 NOTE — Assessment & Plan Note (Signed)
Rpt will be due in 2023

## 2020-02-25 NOTE — Assessment & Plan Note (Signed)
15 lb weight gain with chronic dyspnea. Anticipate component related to himalayan salt use - rec back off this. Could try normal saline nebulization instead.  Will increase lasix dose to 40mg  BID, check Cr/K and BNP in 5 days.

## 2020-02-25 NOTE — Assessment & Plan Note (Signed)
Leg rash acutely worse in the last few days - in setting of worsening pedal edema. He doesn't use compression stockings.  Anticipate component of tinea pedis.  rec leg elevation, work towards diuresis (see above), and restart clotrimazole topically BID.

## 2020-02-25 NOTE — Assessment & Plan Note (Signed)
Acutely worse over the last few days temporally related to himalayan salt inhaler use. rec back off this, increase lasix to 40mg  bid. Reviewed timing of diuretic administration.

## 2020-02-25 NOTE — Progress Notes (Signed)
This visit was conducted in person.  BP 118/76 (BP Location: Left Arm, Patient Position: Sitting, Cuff Size: Large)   Pulse 73   Temp (!) 97.5 F (36.4 C) (Temporal)   Ht '5\' 11"'  (1.803 m)   Wt 290 lb 1 oz (131.6 kg)   SpO2 93%   BMI 40.46 kg/m    CC: abd pain, ongoing symptoms Subjective:    Patient ID: [name], male    DOB: 30-Jan-1956, 64 y.o.   MRN: [id number]  HPI: [name] is a 64 y.o. male presenting on 02/25/2020 for Abdominal Pain (Here for f/u.  Not worsen not better. ), Leg Swelling (C/o bilateral lower leg swelling and rash.  Started 02/23/20.), and Facial Pain (C/o left jaw pain and swelling. Started 02/23/20.)   See prior note for details.  Pending cardiac stress test at end of this month prior to undergoing EGD through GI.  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 Increasing carafate didn't help this time. Continues PPI BID and probiotic as well without much benefit.   Needs to undergo GI workup - this is in process pending cards clearance.  He started using himalayan salt inhalations with significant improvement in breathing (neighbor is holistic provider). He also continues smoking MJ daily.   15 lb weight gain since last visit. Over last few days has noted leg swelling with new tender red rash to L>R lower extremities. He continues furosemide 75m/20mg daily since 12/2019 - takes at 7am and 5-10pm. Feels has good UOP with this. Had recently used new moisturizing lotion prior to rash. Has tried cortizone-10.   Also notes 2d h/o L jaw pain/swelling ?LAD. Hurts to swallow. Edentulous. No fevers/chills, ST. Mild L earache. Chronic R sinus pain/congestion.       Relevant past medical, surgical, family and social history reviewed and updated as indicated. Interim medical history since our last visit reviewed. Allergies and  medications reviewed and updated. Outpatient Medications Prior to Visit  Medication Sig Dispense Refill  . ACCU-CHEK FASTCLIX LANCETS MISC Check blood sugar once daily and as instructed. Dx 250.00 100 each 3  . albuterol (PROVENTIL) (2.5 MG/3ML) 0.083% nebulizer solution Take 3 mLs (2.5 mg total) by nebulization every 6 (six) hours as needed for wheezing or shortness of breath. 150 mL 1  . albuterol (VENTOLIN HFA) 108 (90 Base) MCG/ACT inhaler INHALE 2 PUFFS BY MOUTH EVERY 6 HOURS AS NEEDED FOR WHEEZE OR SHORTNESS OF BREATH 18 g 1  . aspirin EC 81 MG tablet Take 81 mg by mouth daily.    . Blood Glucose Monitoring Suppl (BLOOD GLUCOSE MONITOR SYSTEM) W/DEVICE KIT by Does not apply route. 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 carvedilol (COREG) 6.25 MG tablet TAKE 1 TABLET BY MOUTH 2 TIMES DAILY WITH A MEAL. 180 tablet 0  . Cholecalciferol (VITAMIN D) 50 MCG (2000 UT) CAPS Take 1 capsule (2,000 Units total) by mouth daily. 30 capsule   . citalopram (CELEXA) 10 MG tablet TAKE 1 TABLET BY MOUTH EVERY DAY 90 tablet 2  . Cyanocobalamin (B-12) 1000 MCG SUBL Place 1 tablet under the tongue daily.    . empagliflozin (JARDIANCE) 10 MG TABS tablet Take 1 tablet (10 mg total) by mouth daily before breakfast. 30 tablet 6  . ferrous sulfate 325 (65 FE) MG  tablet Take 650 mg by mouth daily with breakfast.    . fluticasone (FLONASE) 50 MCG/ACT nasal spray SPRAY 2 SPRAYS INTO EACH NOSTRIL EVERY DAY (Patient taking differently: As needed) 16 mL 3  . glimepiride (AMARYL) 4 MG tablet Take 1 tablet (4 mg total) by mouth daily with breakfast. 90 tablet 3  . hydrOXYzine (ATARAX/VISTARIL) 50 MG tablet TAKE 1 TABLET (50 MG TOTAL) BY MOUTH 2 (TWO) TIMES DAILY AS NEEDED FOR ANXIETY. 180 tablet 1  . metFORMIN (GLUCOPHAGE) 1000 MG tablet TAKE 1 TABLET (1,000 MG TOTAL) BY MOUTH 2 (TWO) TIMES DAILY WITH A MEAL. 180 tablet 0  . montelukast (SINGULAIR) 10 MG tablet TAKE 1 TABLET BY MOUTH  EVERYDAY AT BEDTIME 90 tablet 0  . ONETOUCH VERIO test strip USE AS INSTRUCTED TO CHECK THREE TIMES DAILY AND AS NEEDED. E11.8 300 strip 3  . pantoprazole (PROTONIX) 40 MG tablet TAKE 1 TABLET (40 MG TOTAL) BY MOUTH 2 (TWO) TIMES DAILY BEFORE A MEAL. 180 tablet 0  . Probiotic Product (PROBIOTIC DAILY PO) Take by mouth.    . simvastatin (ZOCOR) 20 MG tablet TAKE 1 TABLET BY MOUTH EVERY DAY IN THE EVENING 90 tablet 2  . sucralfate (CARAFATE) 1 g tablet Take 1 tablet (1 g total) by mouth 3 (three) times daily before meals. 90 tablet 1  . furosemide (LASIX) 20 MG tablet TAKE 1 TABLET (20 MG TOTAL) BY MOUTH 2 (TWO) TIMES DAILY AS NEEDED. 180 tablet 0   No facility-administered medications prior to visit.     Per HPI unless specifically indicated in ROS section below Review of Systems Objective:  BP 118/76 (BP Location: Left Arm, Patient Position: Sitting, Cuff Size: Large)   Pulse 73   Temp (!) 97.5 F (36.4 C) (Temporal)   Ht '5\' 11"'  (1.803 m)   Wt 290 lb 1 oz (131.6 kg)   SpO2 93%   BMI 40.46 kg/m   Wt Readings from Last 3 Encounters:  02/25/20 290 lb 1 oz (131.6 kg)  02/08/20 272 lb (123.4 kg)  01/04/20 275 lb 9 oz (125 kg)      Physical Exam Vitals and nursing note reviewed.  Constitutional:      Appearance: Normal appearance. He is obese. He is not ill-appearing.  HENT:     Head: Normocephalic and atraumatic.     Right Ear: Tympanic membrane, ear canal and external ear normal.     Left Ear: Tympanic membrane, ear canal and external ear normal.     Mouth/Throat:     Mouth: Mucous membranes are moist.     Pharynx: Oropharynx is clear. No oropharyngeal exudate.  Eyes:     Extraocular Movements: Extraocular movements intact.     Pupils: Pupils are equal, round, and reactive to light.  Neck:     Thyroid: No thyroid mass or thyromegaly.     Comments: Cervical kyphosis.  Cardiovascular:     Rate and Rhythm: Normal rate and regular rhythm.     Pulses: Normal pulses.     Heart  sounds: Normal heart sounds. No murmur heard.   Pulmonary:     Effort: Pulmonary effort is normal. No respiratory distress.     Breath sounds: No wheezing, rhonchi or rales.     Comments: Bibasilar crackles Abdominal:     General: Abdomen is protuberant. Bowel sounds are normal. There is distension.     Tenderness: There is generalized abdominal tenderness (mild). There is no guarding or rebound. Negative signs include Murphy's sign.  Musculoskeletal:  General: Tenderness present.     Cervical back: No rigidity.     Left lower leg: Edema present.  Lymphadenopathy:     Head:     Right side of head: No submandibular, tonsillar, preauricular or posterior auricular adenopathy.     Left side of head: Submandibular (small) adenopathy present. No tonsillar, preauricular or posterior auricular adenopathy.     Cervical: No cervical adenopathy.     Right cervical: No superficial, deep or posterior cervical adenopathy.    Left cervical: No superficial, deep or posterior cervical adenopathy.     Upper Body:     Right upper body: No supraclavicular adenopathy.     Left upper body: No supraclavicular adenopathy.  Skin:    General: Skin is warm and dry.     Findings: Erythema and rash present.     Comments:  L>R tender and erythematous scaly splotchy confluent rash to ankles into dorsal feet, spares soles (but scaly soles present). Blanching. Dry skin with mild maceration interdigital web spaces of feet.   Neurological:     Mental Status: He is alert.  Psychiatric:        Mood and Affect: Mood normal.        Behavior: Behavior normal.       Results for orders placed or performed during the hospital encounter of 02/08/20  CBC  Result Value Ref Range   WBC 9.5 4.0 - 10.5 K/uL   RBC 4.82 4.22 - 5.81 MIL/uL   Hemoglobin 13.1 13.0 - 17.0 g/dL   HCT 41.6 39 - 52 %   MCV 86.3 80.0 - 100.0 fL   MCH 27.2 26.0 - 34.0 pg   MCHC 31.5 30.0 - 36.0 g/dL   RDW 13.8 11.5 - 15.5 %   Platelets 224  150 - 400 K/uL   nRBC 0.0 0.0 - 0.2 %  Comprehensive metabolic panel  Result Value Ref Range   Sodium 139 135 - 145 mmol/L   Potassium 4.0 3.5 - 5.1 mmol/L   Chloride 96 (L) 98 - 111 mmol/L   CO2 32 22 - 32 mmol/L   Glucose, Bld 162 (H) 70 - 99 mg/dL   BUN 8 8 - 23 mg/dL   Creatinine, Ser 0.55 (L) 0.61 - 1.24 mg/dL   Calcium 8.9 8.9 - 10.3 mg/dL   Total Protein 7.1 6.5 - 8.1 g/dL   Albumin 4.0 3.5 - 5.0 g/dL   AST 28 15 - 41 U/L   ALT 21 0 - 44 U/L   Alkaline Phosphatase 51 38 - 126 U/L   Total Bilirubin 0.6 0.3 - 1.2 mg/dL   GFR, Estimated >60 >60 mL/min   Anion gap 11 5 - 15  Lipase, blood  Result Value Ref Range   Lipase 28 11 - 51 U/L  Troponin I (High Sensitivity)  Result Value Ref Range   Troponin I (High Sensitivity) 8 <18 ng/L   Lab Results  Component Value Date   HGBA1C 7.8 (H) 01/04/2020    Assessment & Plan:  This visit occurred during the SARS-CoV-2 public health emergency.  Safety protocols were in place, including screening questions prior to the visit, additional usage of staff PPE, and extensive cleaning of exam room while observing appropriate contact time as indicated for disinfecting solutions.   Problem List Items Addressed This Visit    Skin rash    Leg rash acutely worse in the last few days - in setting of worsening pedal edema. He doesn't use compression stockings.  Anticipate component of tinea pedis.  rec leg elevation, work towards diuresis (see above), and restart clotrimazole topically BID.      Pedal edema - Primary    Acutely worse over the last few days temporally related to himalayan salt inhaler use. rec back off this, increase lasix to 26m bid. Reviewed timing of diuretic administration.      Relevant Orders   Brain natriuretic peptide   Basic metabolic panel   Neck pain on left side    Anticipate related to enlarged LN of a few days duration. Will watch for now, update if persists.       Gassiness   Epigastric abdominal pain     Pending EGD, pending cards clearance prior (scheduled for end of November).       Chronic systolic CHF (congestive heart failure) (HCC)    15 lb weight gain with chronic dyspnea. Anticipate component related to himalayan salt use - rec back off this. Could try normal saline nebulization instead.  Will increase lasix dose to 435mBID, check Cr/K and BNP in 5 days.       Relevant Medications   furosemide (LASIX) 40 MG tablet   AAA (abdominal aortic aneurysm) without rupture (HCC)    Rpt will be due in 2023      Relevant Medications   furosemide (LASIX) 40 MG tablet       Meds ordered this encounter  Medications  . furosemide (LASIX) 40 MG tablet    Sig: Take 1 tablet (40 mg total) by mouth 2 (two) times daily.    Dispense:  180 tablet    Refill:  0   Orders Placed This Encounter  Procedures  . Brain natriuretic peptide    Standing Status:   Future    Standing Expiration Date:   02/24/2021  . Basic metabolic panel    Standing Status:   Future    Standing Expiration Date:   02/24/2021    Patient Instructions  Increase lasix to 4058mwice daily. Return on Friday for lab visit only.  Try lotrimin (clotrimazole) antifungal twice daily to rash to feet.  Caution with himalayan salts - I worry this may be increasing sodium in the body causing some of this swelling and weight gain.  May try nebulizing normal saline for shortness of breath symptoms instead of himalayan salt.  Keep upcoming cardiology appointments.  Return in 3 months for physical/wellness visit  Follow up plan: Return in about 3 months (around 05/27/2020), or if symptoms worsen or fail to improve, for annual exam, prior fasting for blood work, medicare wellness visit.  JavRia BushD

## 2020-02-27 ENCOUNTER — Other Ambulatory Visit: Payer: Self-pay | Admitting: Family Medicine

## 2020-02-27 NOTE — Telephone Encounter (Signed)
E-scribed refill.  Plz schedule wellness, cpe and lab visits for additional refills.

## 2020-02-28 ENCOUNTER — Telehealth: Payer: Self-pay | Admitting: Family Medicine

## 2020-02-28 NOTE — Telephone Encounter (Signed)
called and left vm for the patient to schedule cpe, mwv, and labs.Left detailed message for the patient EM 02/28/20

## 2020-02-29 ENCOUNTER — Other Ambulatory Visit: Payer: Self-pay | Admitting: Family Medicine

## 2020-02-29 ENCOUNTER — Other Ambulatory Visit (INDEPENDENT_AMBULATORY_CARE_PROVIDER_SITE_OTHER): Payer: Medicare Other

## 2020-02-29 ENCOUNTER — Other Ambulatory Visit: Payer: Self-pay

## 2020-02-29 DIAGNOSIS — R600 Localized edema: Secondary | ICD-10-CM

## 2020-02-29 LAB — BASIC METABOLIC PANEL
BUN: 10 mg/dL (ref 6–23)
CO2: 37 mEq/L — ABNORMAL HIGH (ref 19–32)
Calcium: 9.2 mg/dL (ref 8.4–10.5)
Chloride: 95 mEq/L — ABNORMAL LOW (ref 96–112)
Creatinine, Ser: 0.66 mg/dL (ref 0.40–1.50)
GFR: 99.16 mL/min (ref >60.00)
Glucose, Bld: 177 mg/dL — ABNORMAL HIGH (ref 70–99)
Potassium: 4.8 mEq/L (ref 3.5–5.1)
Sodium: 140 mEq/L (ref 135–145)

## 2020-02-29 LAB — BRAIN NATRIURETIC PEPTIDE: Pro B Natriuretic peptide (BNP): 49 pg/mL (ref 0.0–100.0)

## 2020-03-12 ENCOUNTER — Telehealth: Payer: Self-pay | Admitting: *Deleted

## 2020-03-12 ENCOUNTER — Encounter: Payer: Self-pay | Admitting: *Deleted

## 2020-03-12 NOTE — Telephone Encounter (Signed)
Attempted to contact and schedule lung screening scan. Message left for patient to call back to schedule. 

## 2020-03-22 ENCOUNTER — Other Ambulatory Visit: Payer: Self-pay | Admitting: Family Medicine

## 2020-03-24 DIAGNOSIS — I25118 Atherosclerotic heart disease of native coronary artery with other forms of angina pectoris: Secondary | ICD-10-CM | POA: Diagnosis not present

## 2020-03-24 DIAGNOSIS — I4892 Unspecified atrial flutter: Secondary | ICD-10-CM | POA: Diagnosis not present

## 2020-03-24 DIAGNOSIS — I4891 Unspecified atrial fibrillation: Secondary | ICD-10-CM | POA: Diagnosis not present

## 2020-03-27 DIAGNOSIS — E782 Mixed hyperlipidemia: Secondary | ICD-10-CM | POA: Diagnosis not present

## 2020-03-27 DIAGNOSIS — I4891 Unspecified atrial fibrillation: Secondary | ICD-10-CM | POA: Diagnosis not present

## 2020-03-27 DIAGNOSIS — I2510 Atherosclerotic heart disease of native coronary artery without angina pectoris: Secondary | ICD-10-CM | POA: Diagnosis not present

## 2020-03-27 DIAGNOSIS — I714 Abdominal aortic aneurysm, without rupture: Secondary | ICD-10-CM | POA: Diagnosis not present

## 2020-03-27 DIAGNOSIS — G4733 Obstructive sleep apnea (adult) (pediatric): Secondary | ICD-10-CM | POA: Diagnosis not present

## 2020-03-28 ENCOUNTER — Telehealth: Payer: Self-pay | Admitting: Family Medicine

## 2020-03-28 NOTE — Telephone Encounter (Signed)
Patient received call from [place] no note. Possible automated system error. EM

## 2020-04-02 ENCOUNTER — Other Ambulatory Visit: Payer: Self-pay | Admitting: *Deleted

## 2020-04-02 DIAGNOSIS — Z87891 Personal history of nicotine dependence: Secondary | ICD-10-CM

## 2020-04-02 DIAGNOSIS — Z122 Encounter for screening for malignant neoplasm of respiratory organs: Secondary | ICD-10-CM

## 2020-04-02 NOTE — Progress Notes (Signed)
Contacted and scheduled for lung screening scan. Patient is a former smoker, quit 04/26/18, 31 pack year history.

## 2020-04-07 ENCOUNTER — Ambulatory Visit
Admission: RE | Admit: 2020-04-07 | Discharge: 2020-04-07 | Disposition: A | Discharge status: Home / Self Care | Payer: Medicare Other | Source: Ambulatory Visit | Attending: Oncology | Admitting: Oncology

## 2020-04-07 ENCOUNTER — Other Ambulatory Visit: Payer: Self-pay

## 2020-04-07 DIAGNOSIS — Z122 Encounter for screening for malignant neoplasm of respiratory organs: Secondary | ICD-10-CM | POA: Diagnosis not present

## 2020-04-07 DIAGNOSIS — Z87891 Personal history of nicotine dependence: Secondary | ICD-10-CM | POA: Diagnosis not present

## 2020-04-08 ENCOUNTER — Telehealth: Payer: Self-pay | Admitting: *Deleted

## 2020-04-08 NOTE — Telephone Encounter (Signed)
Notified patient of LDCT lung cancer screening program results with recommendation for 12 month follow up imaging. Also notified of incidental findings noted below and is encouraged to discuss further with PCP who will receive a copy of this note and/or the CT report. Patient verbalizes understanding.   IMPRESSION: Lung-RADS 2, benign appearance or behavior. Continue annual screening with low-dose chest CT without contrast in 12 months.  Aortic Atherosclerosis (ICD10-I70.0) and Emphysema (ICD10-

## 2020-04-14 ENCOUNTER — Ambulatory Visit (INDEPENDENT_AMBULATORY_CARE_PROVIDER_SITE_OTHER): Payer: Medicare Other | Admitting: Family Medicine

## 2020-04-14 ENCOUNTER — Other Ambulatory Visit: Payer: Self-pay

## 2020-04-14 ENCOUNTER — Encounter: Payer: Self-pay | Admitting: Family Medicine

## 2020-04-14 VITALS — BP 138/82 | HR 77 | Temp 97.6°F | Ht 71.0 in | Wt 299.2 lb

## 2020-04-14 DIAGNOSIS — G4731 Primary central sleep apnea: Secondary | ICD-10-CM | POA: Diagnosis not present

## 2020-04-14 DIAGNOSIS — M5030 Other cervical disc degeneration, unspecified cervical region: Secondary | ICD-10-CM | POA: Diagnosis not present

## 2020-04-14 DIAGNOSIS — E1165 Type 2 diabetes mellitus with hyperglycemia: Secondary | ICD-10-CM

## 2020-04-14 DIAGNOSIS — R0609 Other forms of dyspnea: Secondary | ICD-10-CM

## 2020-04-14 DIAGNOSIS — E118 Type 2 diabetes mellitus with unspecified complications: Secondary | ICD-10-CM | POA: Diagnosis not present

## 2020-04-14 DIAGNOSIS — D509 Iron deficiency anemia, unspecified: Secondary | ICD-10-CM | POA: Diagnosis not present

## 2020-04-14 DIAGNOSIS — IMO0002 Reserved for concepts with insufficient information to code with codable children: Secondary | ICD-10-CM

## 2020-04-14 DIAGNOSIS — I2510 Atherosclerotic heart disease of native coronary artery without angina pectoris: Secondary | ICD-10-CM

## 2020-04-14 DIAGNOSIS — R0600 Dyspnea, unspecified: Secondary | ICD-10-CM | POA: Diagnosis not present

## 2020-04-14 LAB — HEMOGLOBIN A1C: Hgb A1c MFr Bld: 8.2 % — ABNORMAL HIGH (ref 4.6–6.5)

## 2020-04-14 LAB — BASIC METABOLIC PANEL
BUN: 10 mg/dL (ref 6–23)
CO2: 34 mEq/L — ABNORMAL HIGH (ref 19–32)
Calcium: 9 mg/dL (ref 8.4–10.5)
Chloride: 98 mEq/L (ref 96–112)
Creatinine, Ser: 0.61 mg/dL (ref 0.40–1.50)
GFR: 101.45 mL/min (ref >60.00)
Glucose, Bld: 194 mg/dL — ABNORMAL HIGH (ref 70–99)
Potassium: 4.4 mEq/L (ref 3.5–5.1)
Sodium: 139 mEq/L (ref 135–145)

## 2020-04-14 LAB — FERRITIN: Ferritin: 12.9 ng/mL — ABNORMAL LOW (ref 22.0–322.0)

## 2020-04-14 MED ORDER — CYCLOBENZAPRINE HCL 10 MG PO TABS: 5.0000 mg | ORAL_TABLET | Freq: Two times a day (BID) | ORAL | 0 refills | Status: DC | PRN

## 2020-04-14 MED ORDER — FERROUS SULFATE 325 (65 FE) MG PO TABS: 325.0000 mg | ORAL_TABLET | Freq: Every day | ORAL | Status: DC

## 2020-04-14 NOTE — Assessment & Plan Note (Signed)
Has not been able to tolerate CPAP machine.  Has been seen previously by pulm for this.

## 2020-04-14 NOTE — Patient Instructions (Addendum)
Labs today  Trial flexeril muscle relaxant (5-10mg  as needed, with sedation precautions).  I'm glad recent heart evaluation was reassuring.  Call [facility] [phone] to schedule colonoscopy/endoscopy.  Return in 3 months for physical/wellness visit

## 2020-04-14 NOTE — Assessment & Plan Note (Signed)
Update A1c on metformin and amaryl and jardiance.

## 2020-04-14 NOTE — Assessment & Plan Note (Signed)
3v CAD by CT scan, recent reassuring sestamibi exercise stress test.

## 2020-04-14 NOTE — Assessment & Plan Note (Signed)
Weight gain noted. Encouraged low sodium diet.

## 2020-04-14 NOTE — Assessment & Plan Note (Signed)
Trial flexeril muscle relaxant with sedation precautions.

## 2020-04-14 NOTE — Assessment & Plan Note (Addendum)
Update ferritin - taking 325mg  daily.

## 2020-04-14 NOTE — Progress Notes (Signed)
Patient ID: [name], male    DOB: 1955-07-26, 64 y.o.   MRN: [id number]  This visit was conducted in person.  BP 138/82 (BP Location: Left Arm, Patient Position: Sitting, Cuff Size: Large)   Pulse 77   Temp 97.6 F (36.4 C) (Temporal)   Ht _0  (1.803 m)   Wt 299 lb 3 oz (135.7 kg)   SpO2 91%   BMI 41.73 kg/m    CC: dyspnea f/u  Subjective:   HPI: [name] is a 64 y.o. male presenting on 04/14/2020 for Shortness of Breath (C/o still having SOB.  Seen by pulmo and cards. Given "clean bill of health.")   H/o chronic systolic CHF with improved EF on latest echo, ongoing exertional dyspnea.   Saw Dr Nehemiah Massed 03/27/2020, note reviewed - holter monitor showed frequent PVC,s PACs, SVT, and asxs bradycardia. Exercise stress test was normal. Echocardiogram showed EF >55% without pulm HTN. Diagnosed with OSA but not regularly using CPAP. Planned RTC 4 mo.   Recent lung cancer screening CT showed 3v CAD, aort ath, emphysema.   Cleared to proceed with EGD [facility]). Advised contact them to schedule this.      Relevant past medical, surgical, family and social history reviewed and updated as indicated. Interim medical history since our last visit reviewed. Allergies and medications reviewed and updated. Outpatient Medications Prior to Visit  Medication Sig Dispense Refill  . ACCU-CHEK FASTCLIX LANCETS MISC Check blood sugar once daily and as instructed. Dx 250.00 100 each 3  . albuterol (PROVENTIL) (2.5 MG/3ML) 0.083% nebulizer solution Take 3 mLs (2.5 mg total) by nebulization every 6 (six) hours as needed for wheezing or shortness of breath. 150 mL 1  . albuterol (VENTOLIN HFA) 108 (90 Base) MCG/ACT inhaler INHALE 2 PUFFS BY MOUTH EVERY 6 HOURS AS NEEDED FOR WHEEZE OR SHORTNESS OF BREATH 18 g 1  . aspirin EC 81 MG tablet Take 81 mg by mouth daily.    . Blood Glucose Monitoring Suppl (BLOOD GLUCOSE MONITOR SYSTEM) W/DEVICE KIT by Does not apply route. Use to check sugar  once daily and as needed Dx: E11.9 **ONE TOUCH VERIO**    . carvedilol (COREG) 6.25 MG tablet TAKE 1 TABLET BY MOUTH 2 TIMES DAILY WITH A MEAL. 180 tablet 0  . Cholecalciferol (VITAMIN D) 50 MCG (2000 UT) CAPS Take 1 capsule (2,000 Units total) by mouth daily. 30 capsule   . citalopram (CELEXA) 10 MG tablet TAKE 1 TABLET BY MOUTH EVERY DAY 90 tablet 2  . Cyanocobalamin (B-12) 1000 MCG SUBL Place 1 tablet under the tongue daily.    . empagliflozin (JARDIANCE) 10 MG TABS tablet Take 1 tablet (10 mg total) by mouth daily before breakfast. 30 tablet 6  . fluticasone (FLONASE) 50 MCG/ACT nasal spray SPRAY 2 SPRAYS INTO EACH NOSTRIL EVERY DAY (Patient taking differently: As needed) 16 mL 3  . furosemide (LASIX) 40 MG tablet Take 1 tablet (40 mg total) by mouth 2 (two) times daily. 180 tablet 0  . glimepiride (AMARYL) 4 MG tablet Take 1 tablet (4 mg total) by mouth daily with breakfast. 90 tablet 3  . hydrOXYzine (ATARAX/VISTARIL) 50 MG tablet TAKE 1 TABLET (50 MG TOTAL) BY MOUTH 2 (TWO) TIMES DAILY AS NEEDED FOR ANXIETY. 180 tablet 0  . isosorbide mononitrate (IMDUR) 30 MG 24 hr tablet Take by mouth.    . metFORMIN (GLUCOPHAGE) 1000 MG tablet TAKE 1 TABLET (1,000 MG TOTAL) BY MOUTH 2 (TWO) TIMES DAILY WITH A  MEAL. 180 tablet 0  . montelukast (SINGULAIR) 10 MG tablet TAKE 1 TABLET BY MOUTH EVERYDAY AT BEDTIME 90 tablet 0  . ONETOUCH VERIO test strip USE AS INSTRUCTED TO CHECK THREE TIMES DAILY AND AS NEEDED. E11.8 300 strip 3  . pantoprazole (PROTONIX) 40 MG tablet TAKE 1 TABLET (40 MG TOTAL) BY MOUTH 2 (TWO) TIMES DAILY BEFORE A MEAL. 180 tablet 0  . Probiotic Product (PROBIOTIC DAILY PO) Take by mouth.    . simvastatin (ZOCOR) 20 MG tablet TAKE 1 TABLET BY MOUTH EVERY DAY IN THE EVENING 90 tablet 2  . sucralfate (CARAFATE) 1 g tablet TAKE 1 TABLET (1 G TOTAL) BY MOUTH 3 (THREE) TIMES DAILY BEFORE MEALS. 90 tablet 1  . ferrous sulfate 325 (65 FE) MG tablet Take 650 mg by mouth daily with breakfast.      No facility-administered medications prior to visit.     Per HPI unless specifically indicated in ROS section below Review of Systems Objective:  BP 138/82 (BP Location: Left Arm, Patient Position: Sitting, Cuff Size: Large)   Pulse 77   Temp 97.6 F (36.4 C) (Temporal)   Ht _0  (1.803 m)   Wt 299 lb 3 oz (135.7 kg)   SpO2 91%   BMI 41.73 kg/m   Wt Readings from Last 3 Encounters:  04/14/20 299 lb 3 oz (135.7 kg)  04/07/20 280 lb (127 kg)  02/25/20 290 lb 1 oz (131.6 kg)      Physical Exam Vitals and nursing note reviewed.  Constitutional:      Appearance: Normal appearance. He is obese. He is not ill-appearing.  Eyes:     Extraocular Movements: Extraocular movements intact.     Pupils: Pupils are equal, round, and reactive to light.  Cardiovascular:     Rate and Rhythm: Normal rate and regular rhythm.     Pulses: Normal pulses.     Heart sounds: Normal heart sounds. No murmur heard.   Pulmonary:     Effort: Pulmonary effort is normal. No respiratory distress.     Breath sounds: No wheezing, rhonchi or rales.     Comments: Crackles bibasilarly Musculoskeletal:     Right lower leg: No edema.     Left lower leg: No edema.  Skin:    General: Skin is warm and dry.     Findings: No rash.  Neurological:     Mental Status: He is alert.  Psychiatric:        Mood and Affect: Mood normal.        Behavior: Behavior normal.       Results for orders placed or performed in visit on 02/29/20  Brain natriuretic peptide  Result Value Ref Range   Pro B Natriuretic peptide (BNP) 49.0 0.0 - 100.0 pg/mL  Basic metabolic panel  Result Value Ref Range   Sodium 140 135 - 145 mEq/L   Potassium 4.8 3.5 - 5.1 mEq/L   Chloride 95 (L) 96 - 112 mEq/L   CO2 37 (H) 19 - 32 mEq/L   Glucose, Bld 177 (H) 70 - 99 mg/dL   BUN 10 6 - 23 mg/dL   Creatinine, Ser 0.66 0.40 - 1.50 mg/dL   GFR 99.16 >60.00 mL/min   Calcium 9.2 8.4 - 10.5 mg/dL   Assessment & Plan:  This visit  occurred during the SARS-CoV-2 public health emergency.  Safety protocols were in place, including screening questions prior to the visit, additional usage of staff PPE, and extensive cleaning of exam  room while observing appropriate contact time as indicated for disinfecting solutions.   Problem List Items Addressed This Visit    Sleep apnea, central    Has not been able to tolerate CPAP machine.  Has been seen previously by pulm for this.       Obesity, morbid, BMI 40.0-49.9 ([place])    Weight gain noted. Encouraged low sodium diet.       Iron deficiency anemia    Update ferritin - taking 385m daily.       Relevant Medications   ferrous sulfate 325 (65 FE) MG tablet   Other Relevant Orders   Ferritin   Exertional dyspnea - Primary    Chronic, ongoing. Maintains O2 sats at 90% during ambulatory pulse ox today. Has had reassuring pulm exam previously, had reassuring cardiac work up most recently. ?GI related.       Diabetes mellitus type 2, uncontrolled, with complications ([place]    Update A1c on metformin and amaryl and jardiance.       Relevant Orders   Basic metabolic panel   Hemoglobin A1c   DDD (degenerative disc disease), cervical    Trial flexeril muscle relaxant with sedation precautions.      Relevant Medications   cyclobenzaprine (FLEXERIL) 10 MG tablet   Coronary artery disease    3v CAD by CT scan, recent reassuring sestamibi exercise stress test.       Relevant Medications   isosorbide mononitrate (IMDUR) 30 MG 24 hr tablet       Meds ordered this encounter  Medications  . cyclobenzaprine (FLEXERIL) 10 MG tablet    Sig: Take 0.5-1 tablets (5-10 mg total) by mouth 2 (two) times daily as needed for muscle spasms (sedation precautions).    Dispense:  30 tablet    Refill:  0  . ferrous sulfate 325 (65 FE) MG tablet    Sig: Take 1 tablet (325 mg total) by mouth daily with breakfast.   Orders Placed This Encounter  Procedures  . Basic metabolic panel  .  Hemoglobin A1c  . Ferritin    Patient Instructions  Labs today  Trial flexeril muscle relaxant (5-135mas needed, with sedation precautions).  I'm glad recent heart evaluation was reassuring.  Call [facility] [phone] schedule colonoscopy/endoscopy.  Return in 3 months for physical/wellness visit    Follow up plan: Return in about 3 months (around 07/13/2020), or if symptoms worsen or fail to improve, for annual exam, prior fasting for blood work, medicare wellness visit.  JaRia BushMD

## 2020-04-14 NOTE — Assessment & Plan Note (Addendum)
Chronic, ongoing. Maintains O2 sats at 90% during ambulatory pulse ox today. Has had reassuring pulm exam previously, had reassuring cardiac work up most recently. ?GI related.

## 2020-04-17 ENCOUNTER — Other Ambulatory Visit: Payer: Self-pay | Admitting: Family Medicine

## 2020-04-24 ENCOUNTER — Telehealth: Payer: Self-pay | Admitting: Gastroenterology

## 2020-04-24 ENCOUNTER — Other Ambulatory Visit: Payer: Self-pay

## 2020-04-24 DIAGNOSIS — E611 Iron deficiency: Secondary | ICD-10-CM

## 2020-04-24 MED ORDER — SUPREP BOWEL PREP KIT 17.5-3.13-1.6 GM/177ML PO SOLN: 1.0000 | ORAL | 0 refills | Status: DC

## 2020-04-24 NOTE — Telephone Encounter (Signed)
Contacted pt and scheduled Colonoscopy and EGD with [facility] at [facility] on 05/13/20.

## 2020-04-24 NOTE — Telephone Encounter (Signed)
Patient who was seen back in 10.2020 states he canceled his egd then due to not wanting it done at the [facility] and needing cardiac clearance. Pt states he has now seen a cardiologist and would like to sch an egd/colon. Pt notified that these can not be done in office and he is okay with scheduling at [facility].Does pt need ov first. Please advise and let pt know.

## 2020-04-25 ENCOUNTER — Other Ambulatory Visit: Payer: Self-pay | Admitting: Family Medicine

## 2020-04-25 MED ORDER — DAPAGLIFLOZIN PROPANEDIOL 5 MG PO TABS: 5.0000 mg | ORAL_TABLET | Freq: Every day | ORAL | 3 refills | Status: DC

## 2020-04-27 ENCOUNTER — Other Ambulatory Visit: Payer: Self-pay | Admitting: Family Medicine

## 2020-04-29 ENCOUNTER — Other Ambulatory Visit: Payer: Self-pay | Admitting: Family Medicine

## 2020-05-07 ENCOUNTER — Other Ambulatory Visit: Payer: Self-pay | Admitting: Family Medicine

## 2020-05-09 ENCOUNTER — Other Ambulatory Visit
Admission: RE | Admit: 2020-05-09 | Discharge: 2020-05-09 | Disposition: A | Discharge status: Home / Self Care | Payer: Medicare Other | Source: Ambulatory Visit | Attending: Gastroenterology | Admitting: Gastroenterology

## 2020-05-09 ENCOUNTER — Other Ambulatory Visit: Payer: Self-pay

## 2020-05-09 DIAGNOSIS — Z20822 Contact with and (suspected) exposure to covid-19: Secondary | ICD-10-CM | POA: Insufficient documentation

## 2020-05-09 DIAGNOSIS — Z01812 Encounter for preprocedural laboratory examination: Secondary | ICD-10-CM | POA: Diagnosis not present

## 2020-05-09 LAB — SARS CORONAVIRUS 2 (TAT 6-24 HRS): SARS Coronavirus 2: NEGATIVE

## 2020-05-12 ENCOUNTER — Encounter: Payer: Self-pay | Admitting: Gastroenterology

## 2020-05-13 ENCOUNTER — Encounter: Admission: RE | Payer: Self-pay | Source: Home / Self Care

## 2020-05-13 ENCOUNTER — Ambulatory Visit: Admission: RE | Admit: 2020-05-13 | Payer: Medicare Other | Source: Home / Self Care | Admitting: Gastroenterology

## 2020-05-13 ENCOUNTER — Other Ambulatory Visit: Payer: Self-pay

## 2020-05-13 DIAGNOSIS — E611 Iron deficiency: Secondary | ICD-10-CM

## 2020-05-13 SURGERY — COLONOSCOPY WITH PROPOFOL
Anesthesia: General

## 2020-05-16 ENCOUNTER — Telehealth: Payer: Self-pay

## 2020-05-16 NOTE — Chronic Care Management (AMB) (Signed)
Chronic Care Management Pharmacy Assistant   Name: [name]  MRN: [id number] DOB: 1956/04/26  Reason for Encounter: Disease State- Diabetes  Patient Questions:  1.  Have you seen any other providers since your last visit? Yes 04/14/20- Dr. Biagio Borg- PCP 03/27/20- Dr. Serafina Royals- Cardiology 02/25/20- Dr. Biagio Borg- PCP- 02/08/20- ED- Chest pain 01/04/20- Dr. Biagio Borg- PCP  10/24/19- Dr. Serafina Royals- Cardiology.   2.  Any changes in your medicines or health? Yes 04/14/20- Dr. Danise Mina added cyclobenzaprine 10 mg and decreased Ferrous Sulfate from 650 mg to 325 mg.  02/25/20- Dr. Danise Mina increased Furosemide from 20 mg BID to 40 mg BID  PCP : [name], MD  Allergies:   Allergies  Allergen Reactions  . Atorvastatin Other (See Comments)    Dizziness  . Lisinopril Cough    Medications: Outpatient Encounter Medications as of 05/16/2020  Medication Sig Note  . ACCU-CHEK FASTCLIX LANCETS MISC Check blood sugar once daily and as instructed. Dx 250.00   . albuterol (PROVENTIL) (2.5 MG/3ML) 0.083% nebulizer solution Take 3 mLs (2.5 mg total) by nebulization every 6 (six) hours as needed for wheezing or shortness of breath.   Marland Kitchen albuterol (VENTOLIN HFA) 108 (90 Base) MCG/ACT inhaler INHALE 2 PUFFS BY MOUTH EVERY 6 HOURS AS NEEDED FOR WHEEZE OR SHORTNESS OF BREATH   . aspirin EC 81 MG tablet Take 81 mg by mouth daily.   . Blood Glucose Monitoring Suppl (BLOOD GLUCOSE MONITOR SYSTEM) W/DEVICE KIT by Does not apply route. Use to check sugar once daily and as needed Dx: E11.9 **ONE TOUCH VERIO**   . carvedilol (COREG) 6.25 MG tablet TAKE 1 TABLET BY MOUTH 2 TIMES DAILY WITH A MEAL.   Marland Kitchen Cholecalciferol (VITAMIN D) 50 MCG (2000 UT) CAPS Take 1 capsule (2,000 Units total) by mouth daily.   . citalopram (CELEXA) 10 MG tablet TAKE 1 TABLET BY MOUTH EVERY DAY   . Cyanocobalamin (B-12) 1000 MCG SUBL Place 1 tablet under the tongue daily. 06/20/2019: Not SL, takes  tablet  . cyclobenzaprine (FLEXERIL) 10 MG tablet Take 0.5-1 tablets (5-10 mg total) by mouth 2 (two) times daily as needed for muscle spasms (sedation precautions).   . dapagliflozin propanediol (FARXIGA) 5 MG TABS tablet Take 1 tablet (5 mg total) by mouth daily before breakfast.   . ferrous sulfate 325 (65 FE) MG tablet Take 1 tablet (325 mg total) by mouth daily with breakfast.   . fluticasone (FLONASE) 50 MCG/ACT nasal spray SPRAY 2 SPRAYS INTO EACH NOSTRIL EVERY DAY (Patient taking differently: As needed)   . furosemide (LASIX) 40 MG tablet Take 1 tablet (40 mg total) by mouth 2 (two) times daily.   Marland Kitchen glimepiride (AMARYL) 4 MG tablet Take 1 tablet (4 mg total) by mouth daily with breakfast.   . hydrOXYzine (ATARAX/VISTARIL) 50 MG tablet TAKE 1 TABLET (50 MG TOTAL) BY MOUTH 2 (TWO) TIMES DAILY AS NEEDED FOR ANXIETY.   . isosorbide mononitrate (IMDUR) 30 MG 24 hr tablet Take by mouth.   . metFORMIN (GLUCOPHAGE) 1000 MG tablet TAKE 1 TABLET (1,000 MG TOTAL) BY MOUTH 2 (TWO) TIMES DAILY WITH A MEAL.   . montelukast (SINGULAIR) 10 MG tablet TAKE 1 TABLET BY MOUTH EVERYDAY AT BEDTIME   . Na Sulfate-K Sulfate-Mg Sulf (SUPREP BOWEL PREP KIT) 17.5-3.13-1.6 GM/177ML SOLN Take 1 kit by mouth as directed.   Glory Rosebush VERIO test strip USE AS INSTRUCTED TO CHECK THREE TIMES DAILY AND AS NEEDED. E11.8   .  pantoprazole (PROTONIX) 40 MG tablet TAKE 1 TABLET (40 MG TOTAL) BY MOUTH 2 (TWO) TIMES DAILY BEFORE A MEAL.   . Probiotic Product (PROBIOTIC DAILY PO) Take by mouth.   . simvastatin (ZOCOR) 20 MG tablet TAKE 1 TABLET BY MOUTH EVERY DAY IN THE EVENING   . sucralfate (CARAFATE) 1 g tablet TAKE 1 TABLET (1 G TOTAL) BY MOUTH 3 (THREE) TIMES DAILY BEFORE MEALS.    No facility-administered encounter medications on file as of 05/16/2020.    Current Diagnosis: Patient Active Problem List   Diagnosis Date Noted  . Pedal edema 02/25/2020  . Neck pain on left side 02/25/2020  . Mediastinal mass 01/05/2020   . Skin rash 10/12/2019  . Vitamin D deficiency 04/19/2019  . Low serum vitamin B12 04/19/2019  . Iron deficiency anemia 01/02/2019  . Personal history of tobacco use, presenting hazards to health 11/07/2018  . Epigastric abdominal pain 09/19/2018  . Chronic bronchitis ([place]) 07/25/2018  . Chronic neck pain [facility] Area of Pain) (R>L) 01/05/2018  . Chronic upper extremity pain ([place] Area of Pain) (R>L) 01/05/2018  . Chronic upper back pain 01/05/2018  . Chronic bilateral low back pain without sciatica 01/05/2018  . Chronic pain syndrome 01/05/2018  . Right-sided headache 10/17/2017  . Right leg weakness 07/14/2017  . AAA (abdominal aortic aneurysm) without rupture ([place]) 09/15/2016  . Adrenal adenoma, left 08/13/2016  . Nodule of right lung 08/13/2016  . Fatty liver 08/01/2016  . Right flank pain 07/26/2016  . 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 Right shoulder pain 07/26/2016  . Restrictive lung disease 06/04/2016  . Chronic systolic CHF (congestive heart failure) ([place])   . Tinea pedis 06/30/2015  . Gassiness 02/21/2015  . Advanced care planning/counseling discussion 10/16/2014  . Health maintenance examination 10/16/2014  . Diabetes mellitus type 2, uncontrolled, with complications ([place]) 60/63/0160  . Medicare annual wellness visit, subsequent 09/03/2013  . Umbilical hernia without obstruction and without gangrene 09/03/2013  . Left flank mass 09/03/2013  . Hyperlipidemia associated with type 2 diabetes mellitus ([place]) 06/29/2013  . Coronary artery disease 06/29/2013  . Abnormal toxicological findings 06/24/2013  . Exertional dyspnea 06/14/2013  . Malaise and fatigue 03/14/2013  . Chest pain 02/07/2013  . Hypertension   . Panic attacks   . Atrial fibrillation and flutter ([place]) 07/07/2011  . Obesity, morbid, BMI 40.0-49.9 ([place]) 07/07/2011  . Ex-smoker 08/13/2009  . Sleep apnea, central 08/13/2009  . DDD (degenerative disc disease), cervical 08/13/2009  . Allergic  rhinitis 06/24/2009   Recent Relevant Labs: Lab Results  Component Value Date/Time   HGBA1C 8.2 (H) 04/14/2020 08:41 AM   HGBA1C 7.8 (H) 01/04/2020 11:28 AM   HGBA1C 7.0 (H) 01/24/2013 04:07 AM   MICROALBUR 1.0 10/17/2017 11:35 AM   MICROALBUR 0.9 10/04/2016 09:23 AM    Kidney Function Lab Results  Component Value Date/Time   CREATININE 0.61 04/14/2020 08:41 AM   CREATININE 0.66 02/29/2020 09:00 AM   CREATININE 0.82 06/20/2013 11:39 AM   CREATININE 0.80 01/24/2013 04:07 AM   GFR 101.45 04/14/2020 08:41 AM   GFRNONAA >60 02/08/2020 11:24 AM   GFRNONAA >60 06/20/2013 11:39 AM   GFRAA >60 12/22/2018 09:28 AM   GFRAA >60 06/20/2013 11:39 AM   . Current antihyperglycemic regimen:   Glimepiride 2 mg - 1 tablet daily with breakfast   Metformin 1000 mg - 1 tablet twice daily with meals  . Have there been any recent hospitalizations or ED visits since last visit with CPP? Yes  o  Colonoscopy 05/22/20  . What are your blood sugars ranging? Unable to reach patient Last A1C 04/14/20- 8.2%  Adherence Review: Is the patient currently on a STATIN medication? Yes Is the patient currently on ACE/ARB medication? No Does the patient have >5 day gap between last estimated fill dates? CPP to review  Unsuccessful outreach to patient after 3 attempts. Will follow up next month.   Follow-Up:  Pharmacist Review   Debbora Dus, CPP notified  Margaretmary Dys, [place] Pharmacy Assistant [phone]

## 2020-05-19 ENCOUNTER — Other Ambulatory Visit: Payer: Self-pay | Admitting: Family Medicine

## 2020-05-19 ENCOUNTER — Other Ambulatory Visit: Payer: Self-pay

## 2020-05-19 MED ORDER — NA SULFATE-K SULFATE-MG SULF 17.5-3.13-1.6 GM/177ML PO SOLN: 1.0000 | Freq: Once | ORAL | 0 refills | Status: AC

## 2020-05-20 ENCOUNTER — Other Ambulatory Visit: Payer: Self-pay

## 2020-05-20 ENCOUNTER — Other Ambulatory Visit
Admission: RE | Admit: 2020-05-20 | Discharge: 2020-05-20 | Disposition: A | Discharge status: Home / Self Care | Payer: Medicare Other | Source: Ambulatory Visit | Attending: Gastroenterology | Admitting: Gastroenterology

## 2020-05-20 DIAGNOSIS — Z20822 Contact with and (suspected) exposure to covid-19: Secondary | ICD-10-CM | POA: Insufficient documentation

## 2020-05-20 DIAGNOSIS — Z01812 Encounter for preprocedural laboratory examination: Secondary | ICD-10-CM | POA: Insufficient documentation

## 2020-05-20 LAB — SARS CORONAVIRUS 2 (TAT 6-24 HRS): SARS Coronavirus 2: NEGATIVE

## 2020-05-22 ENCOUNTER — Encounter
Admission: RE | Disposition: A | Discharge status: Home / Self Care | Payer: Self-pay | Source: Home / Self Care | Attending: Gastroenterology

## 2020-05-22 ENCOUNTER — Other Ambulatory Visit: Payer: Self-pay

## 2020-05-22 ENCOUNTER — Ambulatory Visit
Admission: RE | Admit: 2020-05-22 | Discharge: 2020-05-22 | Disposition: A | Discharge status: Home / Self Care | Payer: Medicare Other | Attending: Gastroenterology | Admitting: Gastroenterology

## 2020-05-22 ENCOUNTER — Ambulatory Visit: Payer: Medicare Other | Admitting: Certified Registered"

## 2020-05-22 ENCOUNTER — Encounter: Payer: Self-pay | Admitting: Gastroenterology

## 2020-05-22 DIAGNOSIS — Z79899 Other long term (current) drug therapy: Secondary | ICD-10-CM | POA: Diagnosis not present

## 2020-05-22 DIAGNOSIS — Z7982 Long term (current) use of aspirin: Secondary | ICD-10-CM | POA: Diagnosis not present

## 2020-05-22 DIAGNOSIS — K640 First degree hemorrhoids: Secondary | ICD-10-CM | POA: Insufficient documentation

## 2020-05-22 DIAGNOSIS — E611 Iron deficiency: Secondary | ICD-10-CM

## 2020-05-22 DIAGNOSIS — I5022 Chronic systolic (congestive) heart failure: Secondary | ICD-10-CM | POA: Diagnosis not present

## 2020-05-22 DIAGNOSIS — D500 Iron deficiency anemia secondary to blood loss (chronic): Secondary | ICD-10-CM

## 2020-05-22 DIAGNOSIS — Z7984 Long term (current) use of oral hypoglycemic drugs: Secondary | ICD-10-CM | POA: Insufficient documentation

## 2020-05-22 DIAGNOSIS — E785 Hyperlipidemia, unspecified: Secondary | ICD-10-CM | POA: Diagnosis not present

## 2020-05-22 DIAGNOSIS — K5730 Diverticulosis of large intestine without perforation or abscess without bleeding: Secondary | ICD-10-CM | POA: Diagnosis not present

## 2020-05-22 DIAGNOSIS — D509 Iron deficiency anemia, unspecified: Secondary | ICD-10-CM | POA: Diagnosis not present

## 2020-05-22 DIAGNOSIS — K633 Ulcer of intestine: Secondary | ICD-10-CM

## 2020-05-22 DIAGNOSIS — K219 Gastro-esophageal reflux disease without esophagitis: Secondary | ICD-10-CM | POA: Diagnosis not present

## 2020-05-22 DIAGNOSIS — I110 Hypertensive heart disease with heart failure: Secondary | ICD-10-CM | POA: Diagnosis not present

## 2020-05-22 DIAGNOSIS — D124 Benign neoplasm of descending colon: Secondary | ICD-10-CM | POA: Insufficient documentation

## 2020-05-22 DIAGNOSIS — Z87891 Personal history of nicotine dependence: Secondary | ICD-10-CM | POA: Insufficient documentation

## 2020-05-22 DIAGNOSIS — K6389 Other specified diseases of intestine: Secondary | ICD-10-CM | POA: Diagnosis not present

## 2020-05-22 HISTORY — PX: COLONOSCOPY WITH PROPOFOL: SHX5780

## 2020-05-22 HISTORY — PX: ESOPHAGOGASTRODUODENOSCOPY (EGD) WITH PROPOFOL: SHX5813

## 2020-05-22 LAB — GLUCOSE, CAPILLARY: Glucose-Capillary: 170 mg/dL — ABNORMAL HIGH (ref 70–99)

## 2020-05-22 SURGERY — COLONOSCOPY WITH PROPOFOL
Anesthesia: General

## 2020-05-22 MED ORDER — PROPOFOL 10 MG/ML IV BOLUS
INTRAVENOUS | Status: DC | PRN
  Administered 2020-05-22: 50 mg via INTRAVENOUS

## 2020-05-22 MED ORDER — GLYCOPYRROLATE 0.2 MG/ML IJ SOLN
INTRAMUSCULAR | Status: AC
  Filled 2020-05-22: qty 1

## 2020-05-22 MED ORDER — MIDAZOLAM HCL 2 MG/2ML IJ SOLN
INTRAMUSCULAR | Status: AC
  Filled 2020-05-22: qty 2

## 2020-05-22 MED ORDER — GLYCOPYRROLATE 0.2 MG/ML IJ SOLN
INTRAMUSCULAR | Status: DC | PRN
  Administered 2020-05-22: .2 mg via INTRAVENOUS

## 2020-05-22 MED ORDER — LIDOCAINE 2% (20 MG/ML) 5 ML SYRINGE
INTRAMUSCULAR | Status: DC | PRN
  Administered 2020-05-22: 25 mg via INTRAVENOUS

## 2020-05-22 MED ORDER — SODIUM CHLORIDE 0.9 % IV SOLN: INTRAVENOUS | Status: DC

## 2020-05-22 MED ORDER — PROPOFOL 500 MG/50ML IV EMUL
INTRAVENOUS | Status: DC | PRN
  Administered 2020-05-22: 120 ug/kg/min via INTRAVENOUS

## 2020-05-22 MED ORDER — MIDAZOLAM HCL 5 MG/5ML IJ SOLN
INTRAMUSCULAR | Status: DC | PRN
  Administered 2020-05-22: 2 mg via INTRAVENOUS

## 2020-05-22 NOTE — Op Note (Signed)
[facility] Gastroenterology Patient Name: [name] Procedure Date: 05/22/2020 9:43 AM MRN: [id number] Account #: [id number] Date of Birth: July 13, 1955 Admit Type: Outpatient Age: 65 Room: [facility] ENDO ROOM 1 Gender: Male Note Status: Finalized Procedure:             Colonoscopy Indications:           Iron deficiency anemia Providers:             Lucilla Lame MD, MD Referring MD:          [name] (Referring MD) Medicines:             Propofol per Anesthesia Complications:         No immediate complications. Procedure:             Pre-Anesthesia Assessment:                        - Prior to the procedure, a History and Physical was                         performed, and patient medications and allergies were                         reviewed. The patient's tolerance of previous                         anesthesia was also reviewed. The risks and benefits                         of the procedure and the sedation options and risks                         were discussed with the patient. All questions were                         answered, and informed consent was obtained. Prior                         Anticoagulants: The patient has taken no previous                         anticoagulant or antiplatelet agents. ASA Grade                         Assessment: IV - A patient with severe systemic                         disease that is a constant threat to life. After                         reviewing the risks and benefits, the patient was                         deemed in satisfactory condition to undergo the                         procedure.  After obtaining informed consent, the colonoscope was                         passed under direct vision. Throughout the procedure,                         the patient's blood pressure, pulse, and oxygen                         saturations were monitored continuously. The                          Colonoscope was introduced through the anus and                         advanced to the the cecum, identified by appendiceal                         orifice and ileocecal valve. The colonoscopy was                         performed without difficulty. The patient tolerated                         the procedure well. The quality of the bowel                         preparation was fair. Findings:      The perianal and digital rectal examinations were normal.      A single (solitary) four mm ulcer was found in the descending colon. No       bleeding was present. No stigmata of recent bleeding were seen. Biopsies       were taken with a cold forceps for histology.      A localized area of granular mucosa was found at the ileocecal valve.       Biopsies were taken with a cold forceps for histology.      Non-bleeding internal hemorrhoids were found during retroflexion. The       hemorrhoids were Grade I (internal hemorrhoids that do not prolapse).      Multiple small-mouthed diverticula were found in the sigmoid colon. Impression:            - Preparation of the colon was fair.                        - A single (solitary) ulcer in the descending colon.                         Biopsied.                        - Granularity at the ileocecal valve. Biopsied.                        - Non-bleeding internal hemorrhoids.                        - Diverticulosis in the sigmoid colon. Recommendation:        - Discharge patient to home.                        -  Resume previous diet.                        - Continue present medications.                        - Await pathology results. Procedure Code(s):     --- Professional ---                        [phone], Colonoscopy, flexible; with biopsy, single or                         multiple Diagnosis Code(s):     --- Professional ---                        D50.9, Iron deficiency anemia, unspecified                        K63.3, Ulcer of intestine CPT  copyright 2019 American Medical Association. All rights reserved. The codes documented in this report are preliminary and upon coder review may  be revised to meet current compliance requirements. Lucilla Lame MD, MD 05/22/2020 10:30:05 AM This report has been signed electronically. Number of Addenda: 0 Note Initiated On: 05/22/2020 9:43 AM Scope Withdrawal Time: 0 hours 6 minutes 45 seconds  Total Procedure Duration: 0 hours 14 minutes 38 seconds  Estimated Blood Loss:  Estimated blood loss: none.      [facility]

## 2020-05-22 NOTE — H&P (Signed)
Lucilla Lame, MD [facility] [address] [place], [place] [address] [phone] Fax : [phone]  Primary Care Physician:  [name], MD Primary Gastroenterologist:  Dr. Allen Norris  Pre-Procedure History & Physical: HPI:  [name] is a 65 y.o. male is here for an endoscopy and colonoscopy.   Past Medical History:  Diagnosis Date  . Abnormal drug screen 2015   MJ positive x3 - one more and we will stop prescribing ativan (08/2013).  . Angina   . Anxiety   . Arthritis   . CAD (coronary artery disease)    nonobstructive  . Cannabis abuse   . Chronic systolic CHF (congestive heart failure) ([place]) 2013   NYHA Class II/III  . COPD (chronic obstructive pulmonary disease) ([place])   . Diabetes type 2, uncontrolled ([place])    declines DSME  . Dilated cardiomyopathy ([place]) 2013   now improved  . Ex-smoker   . Frequent headaches   . History of atrial fibrillation 2013   chronic, s/p ablation prior on coumadin  . Hyperlipidemia   . Hypertension   . Migraine   . Nonischemic cardiomyopathy ([place]) 2013   EF 25% per Dr Nehemiah Massed  . Obesity   . OSA (obstructive sleep apnea)    does not use CPAP - unable to tolerate  . Seasonal allergies     Past Surgical History:  Procedure Laterality Date  . ATRIAL FLUTTER ABLATION N/A 09/21/2011   Procedure: ATRIAL FLUTTER ABLATION;  Surgeon: Thompson Grayer, MD;  Location: [facility] CATH LAB;  Service: Cardiovascular;  Laterality: N/A;  . CARDIAC ELECTROPHYSIOLOGY [place] AND ABLATION  2013   for atrial flutter  . CARDIOVASCULAR STRESS TEST  03/2012   ETT WNL Nehemiah Massed)  . US ECHOCARDIOGRAPHY  2014   EF 50%, nl LV fxn, RV nl size/function, mild mitral insuff    Prior to Admission medications   Medication Sig Start Date End Date Taking? Authorizing Provider  ACCU-CHEK FASTCLIX LANCETS MISC Check blood sugar once daily and as instructed. Dx 250.00 05/10/13   [name], MD  albuterol (PROVENTIL) (2.5 MG/3ML) 0.083% nebulizer  solution Take 3 mLs (2.5 mg total) by nebulization every 6 (six) hours as needed for wheezing or shortness of breath. 10/02/19   [name], MD  albuterol (VENTOLIN HFA) 108 (90 Base) MCG/ACT inhaler INHALE 2 PUFFS BY MOUTH EVERY 6 HOURS AS NEEDED FOR WHEEZE OR SHORTNESS OF BREATH 12/10/19   [name], MD  aspirin EC 81 MG tablet Take 81 mg by mouth daily.    [provider]  Blood Glucose Monitoring Suppl (BLOOD GLUCOSE MONITOR SYSTEM) W/DEVICE KIT by Does not apply route. Use to check sugar once daily and as needed Dx: E11.9 **ONE TOUCH VERIO**    [provider]  carvedilol (COREG) 6.25 MG tablet TAKE 1 TABLET BY MOUTH 2 TIMES DAILY WITH A MEAL. 01/29/20   [name], MD  Cholecalciferol (VITAMIN D) 50 MCG (2000 UT) CAPS Take 1 capsule (2,000 Units total) by mouth daily. 02/05/19   [name], MD  citalopram (CELEXA) 10 MG tablet TAKE 1 TABLET BY MOUTH EVERY DAY 05/07/20   [name], MD  Cyanocobalamin (B-12) 1000 MCG SUBL Place 1 tablet under the tongue daily. 02/05/19   [name], MD  cyclobenzaprine (FLEXERIL) 10 MG tablet Take 0.5-1 tablets (5-10 mg total) by mouth 2 (two) times daily as needed for muscle spasms (sedation precautions). 04/14/20   [name], MD  dapagliflozin propanediol (FARXIGA) 5 MG TABS tablet Take 1 tablet (5  mg total) by mouth daily before breakfast. 04/25/20   [name], MD  ferrous sulfate 325 (65 FE) MG tablet Take 1 tablet (325 mg total) by mouth daily with breakfast. 04/14/20   [name], MD  fluticasone [facility]) 50 MCG/ACT nasal spray SPRAY 2 SPRAYS INTO EACH NOSTRIL EVERY DAY Patient taking differently: As needed 05/04/19   [name], MD  furosemide (LASIX) 40 MG tablet TAKE 1 TABLET BY MOUTH TWICE A DAY 05/21/20   [name], MD  glimepiride (AMARYL) 4 MG tablet Take 1 tablet (4 mg total) by mouth daily with breakfast. 10/02/19   [name], MD  hydrOXYzine  (ATARAX/VISTARIL) 50 MG tablet TAKE 1 TABLET (50 MG TOTAL) BY MOUTH 2 (TWO) TIMES DAILY AS NEEDED FOR ANXIETY. 04/29/20   [name], MD  isosorbide mononitrate (IMDUR) 30 MG 24 hr tablet Take by mouth. 03/27/20 03/27/21  [provider]  metFORMIN (GLUCOPHAGE) 1000 MG tablet TAKE 1 TABLET (1,000 MG TOTAL) BY MOUTH 2 (TWO) TIMES DAILY WITH A MEAL. 04/17/20   [name], MD  montelukast (SINGULAIR) 10 MG tablet TAKE 1 TABLET BY MOUTH EVERYDAY AT BEDTIME 03/23/20   [name], MD  Na Sulfate-K Sulfate-Mg Sulf (SUPREP BOWEL PREP KIT) 17.5-3.13-1.6 GM/177ML SOLN Take 1 kit by mouth as directed. 04/24/20   Lucilla Lame, MD  [facility] VERIO test strip USE AS INSTRUCTED TO CHECK THREE TIMES DAILY AND AS NEEDED. E11.8 12/10/19   [name], MD  pantoprazole (PROTONIX) 40 MG tablet TAKE 1 TABLET (40 MG TOTAL) BY MOUTH 2 (TWO) TIMES DAILY BEFORE A MEAL. 04/17/20   [name], MD  Probiotic Product (PROBIOTIC DAILY PO) Take by mouth.    [provider]  simvastatin (ZOCOR) 20 MG tablet TAKE 1 TABLET BY MOUTH EVERY DAY IN THE EVENING 04/28/20   [name], MD  sucralfate (CARAFATE) 1 g tablet TAKE 1 TABLET (1 G TOTAL) BY MOUTH 3 (THREE) TIMES DAILY BEFORE MEALS. 05/01/20   [name], MD    Allergies as of 05/14/2020 - Review Complete 05/12/2020  Allergen Reaction Noted  . Atorvastatin Other (See Comments) 02/06/2013  . Lisinopril Cough 02/06/2013    Family History  Problem Relation Age of Onset  . Heart failure Father 21  . Cancer Mother 70       NHL  . Hypertension Maternal Grandfather   . CAD Maternal Grandfather 37       MI  . Diabetes Other        grandparents  . Cancer Brother 33       lung (smoker)    Social History   Socioeconomic History  . Marital status: Single    Spouse name: Not on file  . Number of children: 0  . Years of education: Not on file  . Highest education level: Not on file  Occupational History  .  Occupation: disable   Tobacco Use  . Smoking status: Former Smoker    Packs/day: 1.00    Years: 31.00    Pack years: 31.00    Types: Cigarettes    Quit date: 04/26/2018    Years since quitting: 2.0  . Smokeless tobacco: Never Used  Substance and Sexual Activity  . Alcohol use: No    Alcohol/week: 0.0 standard drinks  . Drug use: Yes    Types: Marijuana  . Sexual activity: Yes  Other Topics Concern  . Not on file  Social History Narrative   Pt lives in [place]   Divorced   Occupation: Dealer  Disability due to neck arthritis, anxiety and sleep apnea   Edu: HS   Activity: no regular exercise, mows lawn (riding and push)   Diet: good water, fruits/vegetables daily   Social Determinants of Health   Financial Resource Strain: Not on file  Food Insecurity: Not on file  Transportation Needs: Not on file  Physical Activity: Not on file  Stress: Not on file  Social Connections: Not on file  Intimate Partner Violence: Not on file    Review of Systems: See HPI, otherwise negative ROS  Physical Exam: There were no vitals taken for this visit. General:   Alert,  pleasant and cooperative in NAD Head:  Normocephalic and atraumatic. Neck:  Supple; no masses or thyromegaly. Lungs:  Clear throughout to auscultation.    Heart:  Regular rate and rhythm. Abdomen:  Soft, nontender and nondistended. Normal bowel sounds, without guarding, and without rebound.   Neurologic:  Alert and  oriented x4;  grossly normal neurologically.  Impression/Plan: [name] is here for an endoscopy and colonoscopy to be performed for IDA  Risks, benefits, limitations, and alternatives regarding  endoscopy and colonoscopy have been reviewed with the patient.  Questions have been answered.  All parties agreeable.   Lucilla Lame, MD  05/22/2020, 9:24 AM

## 2020-05-22 NOTE — Op Note (Signed)
[facility] Gastroenterology Patient Name: [name] Procedure Date: 05/22/2020 9:48 AM MRN: [id number] Account #: [id number] Date of Birth: 12-Jun-1955 Admit Type: Outpatient Age: 65 Room: [facility] ENDO ROOM 1 Gender: Male Note Status: Finalized Procedure:             Upper GI endoscopy Indications:           Iron deficiency anemia Providers:             Lucilla Lame MD, MD Referring MD:          [name] (Referring MD) Medicines:             Propofol per Anesthesia Complications:         No immediate complications. Procedure:             Pre-Anesthesia Assessment:                        - Prior to the procedure, a History and Physical was                         performed, and patient medications and allergies were                         reviewed. The patient's tolerance of previous                         anesthesia was also reviewed. The risks and benefits                         of the procedure and the sedation options and risks                         were discussed with the patient. All questions were                         answered, and informed consent was obtained. Prior                         Anticoagulants: The patient has taken no previous                         anticoagulant or antiplatelet agents. ASA Grade                         Assessment: IV - A patient with severe systemic                         disease that is a constant threat to life. After                         reviewing the risks and benefits, the patient was                         deemed in satisfactory condition to undergo the                         procedure.  After obtaining informed consent, the endoscope was                         passed under direct vision. Throughout the procedure,                         the patient's blood pressure, pulse, and oxygen                         saturations were monitored continuously. The Endoscope                          was introduced through the mouth, and advanced to the                         second part of duodenum. The upper GI endoscopy was                         accomplished without difficulty. The patient tolerated                         the procedure well. Findings:      The examined esophagus was normal.      The stomach was normal.      The examined duodenum was normal. Impression:            - Normal esophagus.                        - Normal stomach.                        - Normal examined duodenum.                        - No specimens collected. Recommendation:        - Discharge patient to home.                        - Resume previous diet.                        - Continue present medications.                        - Perform a colonoscopy today. Procedure Code(s):     --- Professional ---                        [phone], Esophagogastroduodenoscopy, flexible,                         transoral; diagnostic, including collection of                         specimen(s) by brushing or washing, when performed                         (separate procedure) Diagnosis Code(s):     --- Professional ---                        D50.9, Iron deficiency anemia,  unspecified CPT copyright 2019 American Medical Association. All rights reserved. The codes documented in this report are preliminary and upon coder review may  be revised to meet current compliance requirements. Lucilla Lame MD, MD 05/22/2020 10:10:52 AM This report has been signed electronically. Number of Addenda: 0 Note Initiated On: 05/22/2020 9:48 AM Estimated Blood Loss:  Estimated blood loss: none.      [facility]

## 2020-05-22 NOTE — Transfer of Care (Signed)
Immediate Anesthesia Transfer of Care Note  Patient: [name]  Procedure(s) Performed: COLONOSCOPY WITH PROPOFOL (N/A ) ESOPHAGOGASTRODUODENOSCOPY (EGD) WITH PROPOFOL (N/A )  Patient Location: Endoscopy Unit  Anesthesia Type:General  Level of Consciousness: awake  Airway & Oxygen Therapy: Patient Spontanous Breathing and Patient connected to nasal cannula oxygen  Post-op Assessment: Report given to RN and Post -op Vital signs reviewed and stable  Post vital signs: Reviewed  Last Vitals:  Vitals Value Taken Time  BP 150/99 05/22/20 1033  Temp 35.8 C 05/22/20 1033  Pulse 107 05/22/20 1033  Resp 17 05/22/20 1033  SpO2 96 % 05/22/20 1033    Last Pain:  Vitals:   05/22/20 1033  TempSrc: Temporal  PainSc: Asleep         Complications: No complications documented.

## 2020-05-22 NOTE — Anesthesia Preprocedure Evaluation (Signed)
Anesthesia Evaluation  Patient identified by MRN, date of birth, ID band Patient awake    Reviewed: Allergy & Precautions, H&P , NPO status , Patient's Chart, lab work & pertinent test results  Airway Mallampati: II  TM Distance: >3 FB Neck ROM: full    Dental  (+) Poor Dentition, Missing   Pulmonary shortness of breath and with exertion, sleep apnea , COPD, former smoker,           Cardiovascular hypertension, + angina + CAD and +CHF  + dysrhythmias Atrial Fibrillation  Rhythm:irregular Rate:Normal     Neuro/Psych Anxiety    GI/Hepatic GERD  ,  Endo/Other  diabetes, Type 2, Oral Hypoglycemic Agents  Renal/GU      Musculoskeletal  (+) Arthritis ,   Abdominal   Peds negative pediatric ROS (+)  Hematology  (+) anemia ,   Anesthesia Other Findings Past Medical History: 2015: Abnormal drug screen     Comment:  MJ positive x3 - one more and we will stop prescribing               ativan (08/2013). No date: Angina No date: Anxiety No date: Arthritis No date: CAD (coronary artery disease)     Comment:  nonobstructive No date: Cannabis abuse 4967: Chronic systolic CHF (congestive heart failure) (HCC)     Comment:  NYHA Class II/III No date: COPD (chronic obstructive pulmonary disease) ([place]) No date: Diabetes type 2, uncontrolled ([place])     Comment:  declines DSME 2013: Dilated cardiomyopathy ([facility])     Comment:  now improved No date: Ex-smoker No date: Frequent headaches 2013: History of atrial fibrillation     Comment:  chronic, s/p ablation prior on coumadin No date: Hyperlipidemia No date: Migraine 2013: Nonischemic cardiomyopathy ([place])     Comment:  EF 25% per Dr Nehemiah Massed No date: Obesity No date: OSA (obstructive sleep apnea)     Comment:  does not use CPAP - unable to tolerate No date: Seasonal allergies  Reproductive/Obstetrics                             Anesthesia  Physical  Anesthesia Plan  ASA: IV  Anesthesia Plan: General   Post-op Pain Management:    Induction: Intravenous  PONV Risk Score and Plan: Propofol infusion  Airway Management Planned: Nasal Cannula  Additional Equipment:   Intra-op Plan:   Post-operative Plan:   Informed Consent: I have reviewed the patients History and Physical, chart, labs and discussed the procedure including the risks, benefits and alternatives for the proposed anesthesia with the patient or authorized representative who has indicated his/her understanding and acceptance.       Plan Discussed with: CRNA, Anesthesiologist and Surgeon  Anesthesia Plan Comments:         Anesthesia Quick Evaluation

## 2020-05-23 ENCOUNTER — Encounter: Payer: Self-pay | Admitting: Gastroenterology

## 2020-05-23 LAB — SURGICAL PATHOLOGY

## 2020-05-23 NOTE — Anesthesia Postprocedure Evaluation (Signed)
Anesthesia Post Note  Patient: [name] Oleski  Procedure(s) Performed: COLONOSCOPY WITH PROPOFOL (N/A ) ESOPHAGOGASTRODUODENOSCOPY (EGD) WITH PROPOFOL (N/A )  Patient location during evaluation: Endoscopy Anesthesia Type: General Level of consciousness: awake and alert and oriented Pain management: pain level controlled Vital Signs Assessment: post-procedure vital signs reviewed and stable Respiratory status: spontaneous breathing Cardiovascular status: blood pressure returned to baseline Anesthetic complications: no   No complications documented.   Last Vitals:  Vitals:   05/22/20 1103 05/22/20 1113  BP: (!) 152/89 (!) 157/98  Pulse: 96 95  Resp: 16 (!) 27  Temp:    SpO2: 99% 95%    Last Pain:  Vitals:   05/22/20 1113  TempSrc:   PainSc: 0-No pain                 Franki Stemen

## 2020-05-25 ENCOUNTER — Encounter: Payer: Self-pay | Admitting: Gastroenterology

## 2020-05-26 ENCOUNTER — Telehealth: Payer: Self-pay

## 2020-05-26 NOTE — Chronic Care Management (AMB) (Addendum)
Chronic Care Management Pharmacy Assistant   Name: [name]  MRN: [id number] DOB: [birth year]  Reason for Encounter: Disease State- Diabetes  Patient Questions:  1.  Have you seen any other providers since your last visit? yes 04/14/20- Dr. Biagio Borg- PCP 03/27/20- Dr. Serafina Royals- Cardiology 02/25/20- Dr. Biagio Borg- PCP- 02/08/20- ED- Chest pain 01/04/20- Dr. Biagio Borg- PCP  10/24/19- Dr. Serafina Royals- Cardiology.   2.  Any changes in your medicines or health? Yes  04/14/20- Dr. Danise Mina added cyclobenzaprine 10 mg and decreased Ferrous Sulfate from 650 mg to 325 mg.  02/25/20- Dr. Danise Mina increased Furosemide from 20 mg BID to 40 mg BID  PCP : [name], MD  Allergies:   Allergies  Allergen Reactions   Atorvastatin Other (See Comments)    Dizziness   Lisinopril Cough    Medications: Outpatient Encounter Medications as of 05/26/2020  Medication Sig Note   ACCU-CHEK FASTCLIX LANCETS MISC Check blood sugar once daily and as instructed. Dx 250.00    albuterol (PROVENTIL) (2.5 MG/3ML) 0.083% nebulizer solution Take 3 mLs (2.5 mg total) by nebulization every 6 (six) hours as needed for wheezing or shortness of breath.    albuterol (VENTOLIN HFA) 108 (90 Base) MCG/ACT inhaler INHALE 2 PUFFS BY MOUTH EVERY 6 HOURS AS NEEDED FOR WHEEZE OR SHORTNESS OF BREATH    aspirin EC 81 MG tablet Take 81 mg by mouth daily.    Blood Glucose Monitoring Suppl (BLOOD GLUCOSE MONITOR SYSTEM) W/DEVICE KIT by Does not apply route. Use to check sugar once daily and as needed Dx: E11.9 **ONE TOUCH VERIO**    carvedilol (COREG) 6.25 MG tablet TAKE 1 TABLET BY MOUTH 2 TIMES DAILY WITH A MEAL.    Cholecalciferol (VITAMIN D) 50 MCG (2000 UT) CAPS Take 1 capsule (2,000 Units total) by mouth daily.    citalopram (CELEXA) 10 MG tablet TAKE 1 TABLET BY MOUTH EVERY DAY    Cyanocobalamin (B-12) 1000 MCG SUBL Place 1 tablet under the tongue daily. 06/20/2019: Not SL, takes tablet    cyclobenzaprine (FLEXERIL) 10 MG tablet Take 0.5-1 tablets (5-10 mg total) by mouth 2 (two) times daily as needed for muscle spasms (sedation precautions).    dapagliflozin propanediol (FARXIGA) 5 MG TABS tablet Take 1 tablet (5 mg total) by mouth daily before breakfast.    ferrous sulfate 325 (65 FE) MG tablet Take 1 tablet (325 mg total) by mouth daily with breakfast.    fluticasone (FLONASE) 50 MCG/ACT nasal spray SPRAY 2 SPRAYS INTO EACH NOSTRIL EVERY DAY (Patient taking differently: As needed)    furosemide (LASIX) 40 MG tablet TAKE 1 TABLET BY MOUTH TWICE A DAY    glimepiride (AMARYL) 4 MG tablet Take 1 tablet (4 mg total) by mouth daily with breakfast.    hydrOXYzine (ATARAX/VISTARIL) 50 MG tablet TAKE 1 TABLET (50 MG TOTAL) BY MOUTH 2 (TWO) TIMES DAILY AS NEEDED FOR ANXIETY.    isosorbide mononitrate (IMDUR) 30 MG 24 hr tablet Take by mouth.    metFORMIN (GLUCOPHAGE) 1000 MG tablet TAKE 1 TABLET (1,000 MG TOTAL) BY MOUTH 2 (TWO) TIMES DAILY WITH A MEAL.    montelukast (SINGULAIR) 10 MG tablet TAKE 1 TABLET BY MOUTH EVERYDAY AT BEDTIME    Na Sulfate-K Sulfate-Mg Sulf (SUPREP BOWEL PREP KIT) 17.5-3.13-1.6 GM/177ML SOLN Take 1 kit by mouth as directed.    ONETOUCH VERIO test strip USE AS INSTRUCTED TO CHECK THREE TIMES DAILY AND AS NEEDED. E11.8    pantoprazole (PROTONIX) 40  MG tablet TAKE 1 TABLET (40 MG TOTAL) BY MOUTH 2 (TWO) TIMES DAILY BEFORE A MEAL.    Probiotic Product (PROBIOTIC DAILY PO) Take by mouth.    simvastatin (ZOCOR) 20 MG tablet TAKE 1 TABLET BY MOUTH EVERY DAY IN THE EVENING    sucralfate (CARAFATE) 1 g tablet TAKE 1 TABLET (1 G TOTAL) BY MOUTH 3 (THREE) TIMES DAILY BEFORE MEALS. (Patient not taking: Reported on 05/22/2020)    No facility-administered encounter medications on file as of 05/26/2020.    Current Diagnosis: Patient Active Problem List   Diagnosis Date Noted   Iron deficiency anemia secondary to blood loss (chronic)    Ulceration of intestine    Pedal edema  02/25/2020   Neck pain on left side 02/25/2020   Mediastinal mass 01/05/2020   Skin rash 10/12/2019   Vitamin D deficiency 04/19/2019   Low serum vitamin B12 04/19/2019   Iron deficiency anemia 01/02/2019   Personal history of tobacco use, presenting hazards to health 11/07/2018   Epigastric abdominal pain 09/19/2018   Chronic bronchitis ([place]) 07/25/2018   Chronic neck pain (Tertiary Area of Pain) (R>L) 01/05/2018   Chronic upper extremity pain (Fourth Area of Pain) (R>L) 01/05/2018   Chronic upper back pain 01/05/2018   Chronic bilateral low back pain without sciatica 01/05/2018   Chronic pain syndrome 01/05/2018   Right-sided headache 10/17/2017   Right leg weakness 07/14/2017   AAA (abdominal aortic aneurysm) without rupture ([place]) 09/15/2016   Adrenal adenoma, left 08/13/2016   Nodule of right lung 08/13/2016   Fatty liver 08/01/2016   Right flank pain 07/26/2016   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   Right shoulder pain 07/26/2016   Restrictive lung disease 40/98/1191   Chronic systolic CHF (congestive heart failure) ([place])    Tinea pedis 06/30/2015   Gassiness 02/21/2015   Advanced care planning/counseling discussion 10/16/2014   Health maintenance examination 10/16/2014   Diabetes mellitus type 2, uncontrolled, with complications ([place]) 47/82/9562   Medicare annual wellness visit, subsequent 13/11/6576   Umbilical hernia without obstruction and without gangrene 09/03/2013   Left flank mass 09/03/2013   Hyperlipidemia associated with type 2 diabetes mellitus ([place]) 06/29/2013   Coronary artery disease 06/29/2013   Abnormal toxicological findings 06/24/2013   Exertional dyspnea 06/14/2013   Malaise and fatigue 03/14/2013   Chest pain 02/07/2013   Hypertension    Panic attacks    Atrial fibrillation and flutter ([place]) 07/07/2011   Obesity, morbid, BMI 40.0-49.9 ([place]) 07/07/2011   Ex-smoker 08/13/2009   Sleep apnea, central 08/13/2009   DDD (degenerative disc disease),  cervical 08/13/2009   Allergic rhinitis 06/24/2009   Recent Relevant Labs: Lab Results  Component Value Date/Time   HGBA1C 8.2 (H) 04/14/2020 08:41 AM   HGBA1C 7.8 (H) 01/04/2020 11:28 AM   HGBA1C 7.0 (H) 01/24/2013 04:07 AM   MICROALBUR 1.0 10/17/2017 11:35 AM   MICROALBUR 0.9 10/04/2016 09:23 AM    Kidney Function Lab Results  Component Value Date/Time   CREATININE 0.61 04/14/2020 08:41 AM   CREATININE 0.66 02/29/2020 09:00 AM   CREATININE 0.82 06/20/2013 11:39 AM   CREATININE 0.80 01/24/2013 04:07 AM   GFR 101.45 04/14/2020 08:41 AM   GFRNONAA >60 02/08/2020 11:24 AM   GFRNONAA >60 06/20/2013 11:39 AM   GFRAA >60 12/22/2018 09:28 AM   GFRAA >60 06/20/2013 11:39 AM    Current antihyperglycemic regimen:  Glimepiride 4 mg - 1 tablet daily with breakfast  Metformin 1000 mg - 1 tablet twice daily with meals  What recent interventions/DTPs have been made to improve glycemic control:  04/15/21 per lab notes - PCP wanted patient to check price on Farxiga since Jardiance was unaffordable   Have there been any recent hospitalizations or ED visits since last visit with CPP? Yes Colonoscopy 05/22/20  Patient denies hypoglycemic symptoms, including Pale, Sweaty, Shaky, Hungry, Nervous/irritable and Vision changes   Patient denies hyperglycemic symptoms, including blurry vision, excessive thirst, fatigue, polyuria and weakness   How often are you checking your blood sugar? once daily   What are your blood sugars ranging? "low 200's". Does not have specific readings available right now.  Fasting: N/A Before meals: N/A After meals: N/A Bedtime: N/A  During the week, how often does your blood glucose drop below 70? Never   Are you checking your feet daily/regularly? Yes, denies any wounds or sores. Does states he has "pins and needles" but this is not new for him. States it has been ongoing for years.   Adherence Review: Is the patient currently on a STATIN medication? Yes Is  the patient currently on ACE/ARB medication? No Does the patient have >5 day gap between last estimated fill dates? CPP to review.  Patient states he is doing fine with his diabetes. "Would not know that he has it if it were not for the glucometer". States he is having increased shortness of breath. This has been ongoing for several months and he states he has seen many doctors for this. He states the "only medication he is on for COPD is the nebulizer". I asked patient if he had any inhalers which he said he had a lot of them. He would like to try "some type of pill" for his COPD. Informed patient message would be relayed and would let him know what is decided.  Follow-Up:  Pharmacist Review   Debbora Dus, CPP notified  Margaretmary Dys, [place] [phone]  Per PCP visit 03/2020 patient has recent reassuring pulmonary and cardiology exam, so he referred for colonoscopy to see if SOB could be GI related/iron deficiency anemia. Pt was prescribed Spiriva in the past and did not take due to cost concerns. There is not a recommended pill for COPD at his stage. Would recommend he try Spiriva again if no improvement following the GI referral -  we can discuss this at [facility] follow up. Also for diabetes - recommend we apply for patient assistance for Farxiga if unable to afford. Will have CMA follow up to see if patient checked price on the Farxiga and to see if he would like to apply for assistance. Schedule CCM visit for this month.   Debbora Dus, PharmD Clinical Pharmacist [facility] at [facility] [phone]

## 2020-05-28 ENCOUNTER — Telehealth: Payer: Self-pay

## 2020-05-28 NOTE — Telephone Encounter (Signed)
Can we contact patient to schedule follow up with PCP sooner? Currently scheduled for mid-March but he has more urgent concerns - would like to discuss COPD treatment and next steps following endoscopy.   Also, do you know if patient needs referral for diabetic eye exam or if he can schedule this on his own?  Debbora Dus, PharmD Clinical Pharmacist [facility] at [facility] [phone]

## 2020-05-28 NOTE — Chronic Care Management (AMB) (Addendum)
Chronic Care Management Pharmacy Assistant   Name: [name]  MRN: [id number] DOB: 1955-11-11  Reason for Encounter: Medication Review - Follow up call.   Patient Questions:  1.  Have you seen any other providers since your last visit? No    PCP : [name], MD  Allergies:   Allergies  Allergen Reactions   Atorvastatin Other (See Comments)    Dizziness   Lisinopril Cough    Medications: Outpatient Encounter Medications as of 05/28/2020  Medication Sig Note   ACCU-CHEK FASTCLIX LANCETS MISC Check blood sugar once daily and as instructed. Dx 250.00    albuterol (PROVENTIL) (2.5 MG/3ML) 0.083% nebulizer solution Take 3 mLs (2.5 mg total) by nebulization every 6 (six) hours as needed for wheezing or shortness of breath.    albuterol (VENTOLIN HFA) 108 (90 Base) MCG/ACT inhaler INHALE 2 PUFFS BY MOUTH EVERY 6 HOURS AS NEEDED FOR WHEEZE OR SHORTNESS OF BREATH    aspirin EC 81 MG tablet Take 81 mg by mouth daily.    Blood Glucose Monitoring Suppl (BLOOD GLUCOSE MONITOR SYSTEM) W/DEVICE KIT by Does not apply route. Use to check sugar once daily and as needed Dx: E11.9 **ONE TOUCH VERIO**    carvedilol (COREG) 6.25 MG tablet TAKE 1 TABLET BY MOUTH 2 TIMES DAILY WITH A MEAL.    Cholecalciferol (VITAMIN D) 50 MCG (2000 UT) CAPS Take 1 capsule (2,000 Units total) by mouth daily.    citalopram (CELEXA) 10 MG tablet TAKE 1 TABLET BY MOUTH EVERY DAY    Cyanocobalamin (B-12) 1000 MCG SUBL Place 1 tablet under the tongue daily. 06/20/2019: Not SL, takes tablet   cyclobenzaprine (FLEXERIL) 10 MG tablet Take 0.5-1 tablets (5-10 mg total) by mouth 2 (two) times daily as needed for muscle spasms (sedation precautions).    dapagliflozin propanediol (FARXIGA) 5 MG TABS tablet Take 1 tablet (5 mg total) by mouth daily before breakfast.    ferrous sulfate 325 (65 FE) MG tablet Take 1 tablet (325 mg total) by mouth daily with breakfast.    fluticasone (FLONASE) 50 MCG/ACT nasal spray SPRAY  2 SPRAYS INTO EACH NOSTRIL EVERY DAY (Patient taking differently: As needed)    furosemide (LASIX) 40 MG tablet TAKE 1 TABLET BY MOUTH TWICE A DAY    glimepiride (AMARYL) 4 MG tablet Take 1 tablet (4 mg total) by mouth daily with breakfast.    hydrOXYzine (ATARAX/VISTARIL) 50 MG tablet TAKE 1 TABLET (50 MG TOTAL) BY MOUTH 2 (TWO) TIMES DAILY AS NEEDED FOR ANXIETY.    isosorbide mononitrate (IMDUR) 30 MG 24 hr tablet Take by mouth.    metFORMIN (GLUCOPHAGE) 1000 MG tablet TAKE 1 TABLET (1,000 MG TOTAL) BY MOUTH 2 (TWO) TIMES DAILY WITH A MEAL.    montelukast (SINGULAIR) 10 MG tablet TAKE 1 TABLET BY MOUTH EVERYDAY AT BEDTIME    Na Sulfate-K Sulfate-Mg Sulf (SUPREP BOWEL PREP KIT) 17.5-3.13-1.6 GM/177ML SOLN Take 1 kit by mouth as directed.    ONETOUCH VERIO test strip USE AS INSTRUCTED TO CHECK THREE TIMES DAILY AND AS NEEDED. E11.8    pantoprazole (PROTONIX) 40 MG tablet TAKE 1 TABLET (40 MG TOTAL) BY MOUTH 2 (TWO) TIMES DAILY BEFORE A MEAL.    Probiotic Product (PROBIOTIC DAILY PO) Take by mouth.    simvastatin (ZOCOR) 20 MG tablet TAKE 1 TABLET BY MOUTH EVERY DAY IN THE EVENING    sucralfate (CARAFATE) 1 g tablet TAKE 1 TABLET (1 G TOTAL) BY MOUTH 3 (THREE) TIMES DAILY BEFORE MEALS. (  Patient not taking: Reported on 05/22/2020)    No facility-administered encounter medications on file as of 05/28/2020.    Current Diagnosis: Patient Active Problem List   Diagnosis Date Noted   Iron deficiency anemia secondary to blood loss (chronic)    Ulceration of intestine    Pedal edema 02/25/2020   Neck pain on left side 02/25/2020   Mediastinal mass 01/05/2020   Skin rash 10/12/2019   Vitamin D deficiency 04/19/2019   Low serum vitamin B12 04/19/2019   Iron deficiency anemia 01/02/2019   Personal history of tobacco use, presenting hazards to health 11/07/2018   Epigastric abdominal pain 09/19/2018   Chronic bronchitis ([place]) 07/25/2018   Chronic neck pain (Tertiary Area of Pain) (R>L) 01/05/2018    Chronic upper extremity pain (Fourth Area of Pain) (R>L) 01/05/2018   Chronic upper back pain 01/05/2018   Chronic bilateral low back pain without sciatica 01/05/2018   Chronic pain syndrome 01/05/2018   Right-sided headache 10/17/2017   Right leg weakness 07/14/2017   AAA (abdominal aortic aneurysm) without rupture ([place]) 09/15/2016   Adrenal adenoma, left 08/13/2016   Nodule of right lung 08/13/2016   Fatty liver 08/01/2016   Right flank pain 07/26/2016   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   Right shoulder pain 07/26/2016   Restrictive lung disease 75/64/3329   Chronic systolic CHF (congestive heart failure) ([place])    Tinea pedis 06/30/2015   Gassiness 02/21/2015   Advanced care planning/counseling discussion 10/16/2014   Health maintenance examination 10/16/2014   Diabetes mellitus type 2, uncontrolled, with complications ([place]) 51/88/4166   Medicare annual wellness visit, subsequent 10/24/1599   Umbilical hernia without obstruction and without gangrene 09/03/2013   Left flank mass 09/03/2013   Hyperlipidemia associated with type 2 diabetes mellitus ([place]) 06/29/2013   Coronary artery disease 06/29/2013   Abnormal toxicological findings 06/24/2013   Exertional dyspnea 06/14/2013   Malaise and fatigue 03/14/2013   Chest pain 02/07/2013   Hypertension    Panic attacks    Atrial fibrillation and flutter ([place]) 07/07/2011   Obesity, morbid, BMI 40.0-49.9 ([place]) 07/07/2011   Ex-smoker 08/13/2009   Sleep apnea, central 08/13/2009   DDD (degenerative disc disease), cervical 08/13/2009   Allergic rhinitis 06/24/2009   Contacted Mr. Werth to follow up on COPD medication questions he presented on our last call. Patient wanted to look in to some type of pill for his COPD. Per Debbora Dus, Pharm. D, at his stage of COPD inhalers are the preferred treatment. Patient states he has Ventolin inhaler but only uses that for emergency. He states he has a friend on Trelegy Ellipta and he  would like to try this.   Sharyn Lull also wanted to follow up on patient's Farxiga that Dr. Danise Mina prescribed in December. Wanted to verify it was affordable to patient. Patient states that co-pay for Wilder Glade is $40.00 and while he can afford it, it is hard.   Patient states that he has not had a diabetic eye exam recently and feels like he needs to have this scheduled.   He would also like to set up an appointment with Dr. Danise Mina to discuss next step following the endoscopy.  Patient agrees to sending off patient assistance forms for the Wilder Glade and would like to sign paper work and provide income documentation when he comes in to see Dr. Danise Mina.  Advised patient once I have heard back about Trelegy and appointments I would follow up with him.   Follow-Up:  Medication Cost Review, Patient Assistance Coordination  and Pharmacist Review    Debbora Dus, CPP notified  Margaretmary Dys, [place] Assistant [phone]  Will assist with scheduling follow up with PCP to discuss COPD treatment and next steps following endoscopy. Would prefer to hold off on COPD treatment until evaluated by PCP. Will see if patient needs referral for diabetic eye exam. CMA will prepare Farxiga paperwork for patient to sign.   Debbora Dus, PharmD Clinical Pharmacist [facility] at [facility] [phone]

## 2020-06-02 ENCOUNTER — Other Ambulatory Visit: Payer: Self-pay | Admitting: Family Medicine

## 2020-06-02 ENCOUNTER — Telehealth: Payer: Self-pay

## 2020-06-02 NOTE — Chronic Care Management (AMB) (Addendum)
Chronic Care Management Pharmacy Assistant   Name: [name]  MRN: [id number] DOB: [birth year]  Reason for Encounter: Patient Assistance Application - [place]   PCP : [name], MD  Allergies:   Allergies  Allergen Reactions   Atorvastatin Other (See Comments)    Dizziness   Lisinopril Cough    Medications: Outpatient Encounter Medications as of 06/02/2020  Medication Sig Note   ACCU-CHEK FASTCLIX LANCETS MISC Check blood sugar once daily and as instructed. Dx 250.00    albuterol (PROVENTIL) (2.5 MG/3ML) 0.083% nebulizer solution Take 3 mLs (2.5 mg total) by nebulization every 6 (six) hours as needed for wheezing or shortness of breath.    albuterol (VENTOLIN HFA) 108 (90 Base) MCG/ACT inhaler INHALE 2 PUFFS BY MOUTH EVERY 6 HOURS AS NEEDED FOR WHEEZE OR SHORTNESS OF BREATH    aspirin EC 81 MG tablet Take 81 mg by mouth daily.    Blood Glucose Monitoring Suppl (BLOOD GLUCOSE MONITOR SYSTEM) W/DEVICE KIT by Does not apply route. Use to check sugar once daily and as needed Dx: E11.9 **ONE TOUCH VERIO**    carvedilol (COREG) 6.25 MG tablet TAKE 1 TABLET BY MOUTH 2 TIMES DAILY WITH A MEAL.    Cholecalciferol (VITAMIN D) 50 MCG (2000 UT) CAPS Take 1 capsule (2,000 Units total) by mouth daily.    citalopram (CELEXA) 10 MG tablet TAKE 1 TABLET BY MOUTH EVERY DAY    Cyanocobalamin (B-12) 1000 MCG SUBL Place 1 tablet under the tongue daily. 06/20/2019: Not SL, takes tablet   cyclobenzaprine (FLEXERIL) 10 MG tablet Take 0.5-1 tablets (5-10 mg total) by mouth 2 (two) times daily as needed for muscle spasms (sedation precautions).    dapagliflozin propanediol (FARXIGA) 5 MG TABS tablet Take 1 tablet (5 mg total) by mouth daily before breakfast.    ferrous sulfate 325 (65 FE) MG tablet Take 1 tablet (325 mg total) by mouth daily with breakfast.    fluticasone (FLONASE) 50 MCG/ACT nasal spray SPRAY 2 SPRAYS INTO EACH NOSTRIL EVERY DAY (Patient taking differently: As needed)     furosemide (LASIX) 40 MG tablet TAKE 1 TABLET BY MOUTH TWICE A DAY    glimepiride (AMARYL) 4 MG tablet Take 1 tablet (4 mg total) by mouth daily with breakfast.    hydrOXYzine (ATARAX/VISTARIL) 50 MG tablet TAKE 1 TABLET (50 MG TOTAL) BY MOUTH 2 (TWO) TIMES DAILY AS NEEDED FOR ANXIETY.    isosorbide mononitrate (IMDUR) 30 MG 24 hr tablet Take by mouth.    metFORMIN (GLUCOPHAGE) 1000 MG tablet TAKE 1 TABLET (1,000 MG TOTAL) BY MOUTH 2 (TWO) TIMES DAILY WITH A MEAL.    montelukast (SINGULAIR) 10 MG tablet TAKE 1 TABLET BY MOUTH EVERYDAY AT BEDTIME    Na Sulfate-K Sulfate-Mg Sulf (SUPREP BOWEL PREP KIT) 17.5-3.13-1.6 GM/177ML SOLN Take 1 kit by mouth as directed.    ONETOUCH VERIO test strip USE AS INSTRUCTED TO CHECK THREE TIMES DAILY AND AS NEEDED. E11.8    pantoprazole (PROTONIX) 40 MG tablet TAKE 1 TABLET (40 MG TOTAL) BY MOUTH 2 (TWO) TIMES DAILY BEFORE A MEAL.    Probiotic Product (PROBIOTIC DAILY PO) Take by mouth.    simvastatin (ZOCOR) 20 MG tablet TAKE 1 TABLET BY MOUTH EVERY DAY IN THE EVENING    sucralfate (CARAFATE) 1 g tablet TAKE 1 TABLET (1 G TOTAL) BY MOUTH 3 (THREE) TIMES DAILY BEFORE MEALS. (Patient not taking: Reported on 05/22/2020)    No facility-administered encounter medications on file as of 06/02/2020.  Current Diagnosis: Patient Active Problem List   Diagnosis Date Noted   Iron deficiency anemia secondary to blood loss (chronic)    Ulceration of intestine    Pedal edema 02/25/2020   Neck pain on left side 02/25/2020   Mediastinal mass 01/05/2020   Skin rash 10/12/2019   Vitamin D deficiency 04/19/2019   Low serum vitamin B12 04/19/2019   Iron deficiency anemia 01/02/2019   Personal history of tobacco use, presenting hazards to health 11/07/2018   Epigastric abdominal pain 09/19/2018   Chronic bronchitis ([place]) 07/25/2018   Chronic neck pain (Tertiary Area of Pain) (R>L) 01/05/2018   Chronic upper extremity pain (Fourth Area of Pain) (R>L) 01/05/2018   Chronic  upper back pain 01/05/2018   Chronic bilateral low back pain without sciatica 01/05/2018   Chronic pain syndrome 01/05/2018   Right-sided headache 10/17/2017   Right leg weakness 07/14/2017   AAA (abdominal aortic aneurysm) without rupture ([place]) 09/15/2016   Adrenal adenoma, left 08/13/2016   Nodule of right lung 08/13/2016   Fatty liver 08/01/2016   Right flank pain 07/26/2016   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[place]) 07/26/2016   Right shoulder pain 07/26/2016   Restrictive lung disease 88/67/7373   Chronic systolic CHF (congestive heart failure) ([place])    Tinea pedis 06/30/2015   Gassiness 02/21/2015   Advanced care planning/counseling discussion 10/16/2014   Health maintenance examination 10/16/2014   Diabetes mellitus type 2, uncontrolled, with complications ([place]) 66/81/5947   Medicare annual wellness visit, subsequent 07/61/5183   Umbilical hernia without obstruction and without gangrene 09/03/2013   Left flank mass 09/03/2013   Hyperlipidemia associated with type 2 diabetes mellitus ([place]) 06/29/2013   Coronary artery disease 06/29/2013   Abnormal toxicological findings 06/24/2013   Exertional dyspnea 06/14/2013   Malaise and fatigue 03/14/2013   Chest pain 02/07/2013   Hypertension    Panic attacks    Atrial fibrillation and flutter ([place]) 07/07/2011   Obesity, morbid, BMI 40.0-49.9 ([place]) 07/07/2011   Ex-smoker 08/13/2009   Sleep apnea, central 08/13/2009   DDD (degenerative disc disease), cervical 08/13/2009   Allergic rhinitis 06/24/2009   Per patients request, patient assistance application for Wilder Glade has been filled out and sent to [employer]. Patient has appointment with Dr. Danise Mina on 06/04/20 and will sign application and provide proof of income at that time.   Follow-Up:  Patient Assistance Coordination and Pharmacist Review  Debbora Dus, CPP notified  Margaretmary Dys, [place] Assistant [phone]  I have reviewed the care management and care  coordination activities outlined in this encounter and I am certifying that I agree with the content of this note. No further action required.  Debbora Dus, PharmD Clinical Pharmacist [facility] at [facility] [phone]

## 2020-06-02 NOTE — Telephone Encounter (Signed)
Pharmacy requests refill on: Pantoprazole 40 mg & Metformin 1000 mg   LAST REFILL: 04/17/2020 (Q-180, R-0) LAST OV: 04/14/2020 NEXT OV: 06/04/2020 PHARMACY: [facility], [place] requests refill on: Furosemide 20 mg   LAST REFILL: 05/21/2020 (Q-180, R-1) LAST OV: 04/14/2020 NEXT OV: 06/04/2020 PHARMACY: [facility] [place], [place] requests refill on: Montelukast 10 mg   LAST REFILL: 03/23/2020 (Q-180, R-0) LAST OV: 04/14/2020 NEXT OV: 06/04/2020 PHARMACY: [facility] [place], [place] requests refill on: Carvedilol 6.25 mg   LAST REFILL: 01/29/2020 (Q-180, R-0) LAST OV: 04/14/2020 NEXT OV: 06/04/2020 PHARMACY: [id number] [place], [place]

## 2020-06-04 ENCOUNTER — Ambulatory Visit (INDEPENDENT_AMBULATORY_CARE_PROVIDER_SITE_OTHER): Payer: Medicare Other | Admitting: Family Medicine

## 2020-06-04 ENCOUNTER — Other Ambulatory Visit: Payer: Self-pay

## 2020-06-04 ENCOUNTER — Encounter: Payer: Self-pay | Admitting: Family Medicine

## 2020-06-04 VITALS — BP 122/74 | HR 70 | Temp 97.6°F | Ht 71.0 in | Wt 289.1 lb

## 2020-06-04 DIAGNOSIS — J984 Other disorders of lung: Secondary | ICD-10-CM | POA: Diagnosis not present

## 2020-06-04 DIAGNOSIS — H43392 Other vitreous opacities, left eye: Secondary | ICD-10-CM

## 2020-06-04 DIAGNOSIS — E1165 Type 2 diabetes mellitus with hyperglycemia: Secondary | ICD-10-CM

## 2020-06-04 DIAGNOSIS — J449 Chronic obstructive pulmonary disease, unspecified: Secondary | ICD-10-CM

## 2020-06-04 DIAGNOSIS — R1013 Epigastric pain: Secondary | ICD-10-CM

## 2020-06-04 DIAGNOSIS — IMO0002 Reserved for concepts with insufficient information to code with codable children: Secondary | ICD-10-CM

## 2020-06-04 DIAGNOSIS — E118 Type 2 diabetes mellitus with unspecified complications: Secondary | ICD-10-CM | POA: Diagnosis not present

## 2020-06-04 DIAGNOSIS — M5030 Other cervical disc degeneration, unspecified cervical region: Secondary | ICD-10-CM | POA: Diagnosis not present

## 2020-06-04 DIAGNOSIS — G4733 Obstructive sleep apnea (adult) (pediatric): Secondary | ICD-10-CM | POA: Diagnosis not present

## 2020-06-04 DIAGNOSIS — R0609 Other forms of dyspnea: Secondary | ICD-10-CM

## 2020-06-04 DIAGNOSIS — K633 Ulcer of intestine: Secondary | ICD-10-CM | POA: Diagnosis not present

## 2020-06-04 DIAGNOSIS — R0600 Dyspnea, unspecified: Secondary | ICD-10-CM

## 2020-06-04 DIAGNOSIS — F410 Panic disorder [episodic paroxysmal anxiety] without agoraphobia: Secondary | ICD-10-CM

## 2020-06-04 DIAGNOSIS — I5022 Chronic systolic (congestive) heart failure: Secondary | ICD-10-CM

## 2020-06-04 MED ORDER — UMECLIDINIUM BROMIDE 62.5 MCG/INH IN AEPB: 1.0000 | INHALATION_SPRAY | Freq: Every day | RESPIRATORY_TRACT | 3 refills | Status: DC

## 2020-06-04 MED ORDER — PANTOPRAZOLE SODIUM 40 MG PO TBEC: 40.0000 mg | DELAYED_RELEASE_TABLET | Freq: Every day | ORAL | 3 refills | Status: DC

## 2020-06-04 NOTE — Patient Instructions (Addendum)
I'm glad GI testing returned overall ok.  Drop pantoprazole to 40mg  once daily.  Price out incruse ellipta daily inhaler for COPD.  Vision screen today. We will refer you to eye doctor.  Bring proof of income so we can send application for [place].  Keep appointment for next month.  We will refer you back to sleep doctors for sleep apnea recommendations.

## 2020-06-04 NOTE — Progress Notes (Signed)
Patient ID: SOU NOHR, male    DOB: 13-Jan-1956, 65 y.o.   MRN: [id number]  This visit was conducted in person.  BP 122/74 (BP Location: Left Arm, Patient Position: Sitting, Cuff Size: Large)   Pulse 70   Temp 97.6 F (36.4 C) (Temporal)   Ht _0  (1.803 m)   Wt 289 lb 1 oz (131.1 kg)   SpO2 93%   BMI 40.32 kg/m     Hearing Screening   _1  _2  _3  _4  _5  _6  _7  _8  _9   Right ear:           Left ear:             Visual Acuity Screening   Right eye Left eye Both eyes  Without correction: _10  With correction:        CC: f/u dyspnea  Subjective:   HPI: [name] is a 65 y.o. male presenting on 06/04/2020 for Shortness of Breath (Still c/o SOB.  ) and Eye Problem (C/o seeing "fire flies".  Started about 2 wks ago. )   H/o chronic systolic CHF with improved EF on latest echo, ongoing exertional dyspnea. Reassuring cardiac evaluation 03/2020 [facility] Massed) - holter, ETT, echo. Dx severe OSA, not using CPAP regularly - takes off in his sleep. Asks about Inspire. Presumed COPD - interested in daily medication for this. Previously spiriva unaffordable. Interested in retrial respiratory medicine. Quit smoking 2020. ~30 PY hx. No significant cough. Does wheeze and ongoing exertional dyspnea.   Saw GI - colonoscopy showed TA, diverticulosis with colon ulcer biopsy o/w normal (rpt 7 yrs), EGD WNL. Continues pantoprazole BID.   DM - we are working on Nutritional therapist for patient assistance for [place]. He did not bring proof of income - will return with this.  Lab Results  Component Value Date   HGBA1C 8.2 (H) 04/14/2020    Overdue for diabetic eye exam - last seen 04/2017. Notes worsening floaters to left vision x2 wks without photopsia or eye pain, itching or watery eyes. Requests referral for eye exam.   Panic attack - notes he needs 132m hydroxyzine to get effect. Continues celexa 170mdaily.      Relevant past medical,  surgical, family and social history reviewed and updated as indicated. Interim medical history since our last visit reviewed. Allergies and medications reviewed and updated. Outpatient Medications Prior to Visit  Medication Sig Dispense Refill  . ACCU-CHEK FASTCLIX LANCETS MISC Check blood sugar once daily and as instructed. Dx 250.00 100 each 3  . albuterol (PROVENTIL) (2.5 MG/3ML) 0.083% nebulizer solution Take 3 mLs (2.5 mg total) by nebulization every 6 (six) hours as needed for wheezing or shortness of breath. 150 mL 1  . albuterol (VENTOLIN HFA) 108 (90 Base) MCG/ACT inhaler INHALE 2 PUFFS BY MOUTH EVERY 6 HOURS AS NEEDED FOR WHEEZE OR SHORTNESS OF BREATH 18 g 1  . aspirin EC 81 MG tablet Take 81 mg by mouth daily.    . Blood Glucose Monitoring Suppl (BLOOD GLUCOSE MONITOR SYSTEM) W/DEVICE KIT by Does not apply route. 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 carvedilol (COREG) 6.25 MG tablet TAKE 1 TABLET BY MOUTH 2 TIMES DAILY WITH A MEAL. 180 tablet 0  . Cholecalciferol (VITAMIN D) 50 MCG (2000 UT) CAPS Take 1 capsule (2,000 Units total) by mouth daily. 30 capsule   . citalopram (CELEXA) 10 MG tablet TAKE 1 TABLET BY  MOUTH EVERY DAY 90 tablet 0  . Cyanocobalamin (B-12) 1000 MCG SUBL Place 1 tablet under the tongue daily.    . cyclobenzaprine (FLEXERIL) 10 MG tablet Take 0.5-1 tablets (5-10 mg total) by mouth 2 (two) times daily as needed for muscle spasms (sedation precautions). 30 tablet 0  . dapagliflozin propanediol (FARXIGA) 5 MG TABS tablet Take 1 tablet (5 mg total) by mouth daily before breakfast. 30 tablet 3  . ferrous sulfate 325 (65 FE) MG tablet Take 1 tablet (325 mg total) by mouth daily with breakfast.    . fluticasone (FLONASE) 50 MCG/ACT nasal spray SPRAY 2 SPRAYS INTO EACH NOSTRIL EVERY DAY (Patient taking differently: As needed) 16 mL 3  . furosemide (LASIX) 40 MG tablet TAKE 1 TABLET BY MOUTH TWICE A DAY 180 tablet 1  . glimepiride  (AMARYL) 4 MG tablet Take 1 tablet (4 mg total) by mouth daily with breakfast. 90 tablet 3  . hydrOXYzine (ATARAX/VISTARIL) 50 MG tablet TAKE 1 TABLET (50 MG TOTAL) BY MOUTH 2 (TWO) TIMES DAILY AS NEEDED FOR ANXIETY. 180 tablet 1  . isosorbide mononitrate (IMDUR) 30 MG 24 hr tablet Take by mouth.    . metFORMIN (GLUCOPHAGE) 1000 MG tablet TAKE 1 TABLET (1,000 MG TOTAL) BY MOUTH 2 (TWO) TIMES DAILY WITH A MEAL. 180 tablet 0  . montelukast (SINGULAIR) 10 MG tablet TAKE 1 TABLET BY MOUTH EVERYDAY AT BEDTIME 90 tablet 0  . ONETOUCH VERIO test strip USE AS INSTRUCTED TO CHECK THREE TIMES DAILY AND AS NEEDED. E11.8 300 strip 3  . Probiotic Product (PROBIOTIC DAILY PO) Take by mouth.    . simvastatin (ZOCOR) 20 MG tablet TAKE 1 TABLET BY MOUTH EVERY DAY IN THE EVENING 90 tablet 2  . pantoprazole (PROTONIX) 40 MG tablet TAKE 1 TABLET (40 MG TOTAL) BY MOUTH 2 (TWO) TIMES DAILY BEFORE A MEAL. 180 tablet 0  . Na Sulfate-K Sulfate-Mg Sulf (SUPREP BOWEL PREP KIT) 17.5-3.13-1.6 GM/177ML SOLN Take 1 kit by mouth as directed. 354 mL 0  . sucralfate (CARAFATE) 1 g tablet TAKE 1 TABLET (1 G TOTAL) BY MOUTH 3 (THREE) TIMES DAILY BEFORE MEALS. 90 tablet 5   No facility-administered medications prior to visit.     Per HPI unless specifically indicated in ROS section below Review of Systems Objective:  BP 122/74 (BP Location: Left Arm, Patient Position: Sitting, Cuff Size: Large)   Pulse 70   Temp 97.6 F (36.4 C) (Temporal)   Ht _0  (1.803 m)   Wt 289 lb 1 oz (131.1 kg)   SpO2 93%   BMI 40.32 kg/m   Wt Readings from Last 3 Encounters:  06/04/20 289 lb 1 oz (131.1 kg)  04/14/20 299 lb 3 oz (135.7 kg)  04/07/20 280 lb (127 kg)      Physical Exam Vitals and nursing note reviewed.  Constitutional:      Appearance: Normal appearance. He is obese. He is not ill-appearing.  Neck:     Comments: Kyphosis with abnormal forward flexion cerivcal neck  Cardiovascular:     Rate and Rhythm: Normal rate and  regular rhythm.     Pulses: Normal pulses.     Heart sounds: Normal heart sounds. No murmur heard.   Pulmonary:     Effort: Pulmonary effort is normal. No respiratory distress.     Breath sounds: Normal breath sounds. No wheezing, rhonchi or rales.  Abdominal:     General: Abdomen is protuberant. Bowel sounds are normal.     Palpations: Abdomen  is soft. There is no mass.     Tenderness: There is abdominal tenderness (mild) in the epigastric area. There is no right CVA tenderness, left CVA tenderness, guarding or rebound. Negative signs include Murphy's sign.  Musculoskeletal:     Right lower leg: No edema.     Left lower leg: No edema.  Neurological:     Mental Status: He is alert.  Psychiatric:        Mood and Affect: Mood normal.        Behavior: Behavior normal.       Lab Results  Component Value Date   TSH 2.63 10/02/2019    Assessment & Plan:  This visit occurred during the SARS-CoV-2 public health emergency.  Safety protocols were in place, including screening questions prior to the visit, additional usage of staff PPE, and extensive cleaning of exam room while observing appropriate contact time as indicated for disinfecting solutions.   Problem List Items Addressed This Visit    Vitreous floaters of left eye - Primary    Without photopsia. Overdue for eye exam - will refer for diabetic eye exam and further evaluation of increase in floaters.       Relevant Orders   Ambulatory referral to Ophthalmology   Ulceration of intestine    Recent colonoscopy showed colonic ulcer but reassuringly normal EGD.       Restrictive lung disease   Panic attacks    Ongoing, dyspnea exacerbates - he is using up to 180m hydroxyzine for this - caution with anticholinergic side effects.  Consider increased celexa dose.       OSA (obstructive sleep apnea)    Severe with AHI 77 on sleep study 06/2017. This can contribute to dyspnea. Has been unable to tolerate CPAP - takes mask off in  his sleep. Interested in discussion for other options - asks about Inspire maskless device - will refer back to pulm to review options.       Relevant Orders   Ambulatory referral to Pulmonology   Obesity, morbid, BMI 40.0-49.9 ([place]    Obesity can contribute to weight gain.       Exertional dyspnea    Chronic, ongoing. Reassuring cardiac evaluation and now GI evaluation.  Anticipate component of COPD and OSA - see below.       Epigastric abdominal pain    EGD WNL 03/2020. Colonoscopy with colon ulcer.  Will drop pantoprazole to 425monce daily.       Diabetes mellitus type 2, uncontrolled, with complications ([facility]   Pending patient assistance application for farxiga - he will bring proof of income to submit application.       DDD (degenerative disc disease), cervical    Abnormal reversal of normal cervical lordosis previously attributed to DDD - discussed possible contribution of posture to dyspnea. He asks about chiropractor care. Consider PT eval.       COPD (chronic obstructive pulmonary disease) ([facility]   Latest pulm eval 2019 - started on spiriva, unable to afford. Will send in incruse ellipta to price out. Anticipate will need regular respiratory medication to help chronic exertional dyspnea.       Relevant Medications   umeclidinium bromide (INCRUSE ELLIPTA) 62.5 MCG/INH AEPB   Chronic systolic CHF (congestive heart failure) ([facility])    10 lb weight loss noted - seems euvolemic.  Continues lasix 4072mid.           Meds ordered this encounter  Medications  . umeclidinium bromide (INCRUSE  ELLIPTA) 62.5 MCG/INH AEPB    Sig: Inhale 1 puff into the lungs daily.    Dispense:  30 each    Refill:  3    Price out vs spiriva  . pantoprazole (PROTONIX) 40 MG tablet    Sig: Take 1 tablet (40 mg total) by mouth daily.    Dispense:  90 tablet    Refill:  3    Note new sig   Orders Placed This Encounter  Procedures  . Ambulatory referral to Ophthalmology    Referral  Priority:   Routine    Referral Type:   Consultation    Referral Reason:   Specialty Services Required    Requested Specialty:   Ophthalmology    Number of Visits Requested:   1  . Ambulatory referral to Pulmonology    Referral Priority:   Routine    Referral Type:   Consultation    Referral Reason:   Specialty Services Required    Requested Specialty:   Pulmonary Disease    Number of Visits Requested:   1    Patient Instructions  I'm glad GI testing returned overall ok.  Drop pantoprazole to 23m once daily.  Price out incruse ellipta daily inhaler for COPD.  Vision screen today. We will refer you to eye doctor.  Bring proof of income so we can send application for [place]  Keep appointment for next month.  We will refer you back to sleep doctors for sleep apnea recommendations.    Follow up plan: Return if symptoms worsen or fail to improve.  JRia Bush MD

## 2020-06-05 ENCOUNTER — Encounter: Payer: Self-pay | Admitting: Family Medicine

## 2020-06-05 DIAGNOSIS — G4761 Periodic limb movement disorder: Secondary | ICD-10-CM | POA: Insufficient documentation

## 2020-06-05 DIAGNOSIS — H43392 Other vitreous opacities, left eye: Secondary | ICD-10-CM | POA: Insufficient documentation

## 2020-06-05 NOTE — Assessment & Plan Note (Addendum)
Severe with AHI 77 on sleep study 06/2017. This can contribute to dyspnea. Has been unable to tolerate CPAP - takes mask off in his sleep. Interested in discussion for other options - asks about Inspire maskless device - will refer back to pulm to review options.

## 2020-06-05 NOTE — Assessment & Plan Note (Addendum)
Abnormal reversal of normal cervical lordosis previously attributed to DDD - discussed possible contribution of posture to dyspnea. He asks about chiropractor care. Consider PT eval.

## 2020-06-05 NOTE — Assessment & Plan Note (Signed)
Pending patient assistance application for farxiga - he will bring proof of income to submit application.

## 2020-06-05 NOTE — Assessment & Plan Note (Signed)
Obesity can contribute to weight gain.

## 2020-06-05 NOTE — Assessment & Plan Note (Signed)
Latest pulm eval 2019 - started on spiriva, unable to afford. Will send in incruse ellipta to price out. Anticipate will need regular respiratory medication to help chronic exertional dyspnea.

## 2020-06-05 NOTE — Assessment & Plan Note (Addendum)
Ongoing, dyspnea exacerbates - he is using up to 100mg  hydroxyzine for this - caution with anticholinergic side effects.  Consider increased celexa dose.

## 2020-06-05 NOTE — Assessment & Plan Note (Addendum)
Chronic, ongoing. Reassuring cardiac evaluation and now GI evaluation.  Anticipate component of COPD and OSA - see below.

## 2020-06-05 NOTE — Assessment & Plan Note (Signed)
Recent colonoscopy showed colonic ulcer but reassuringly normal EGD.

## 2020-06-05 NOTE — Assessment & Plan Note (Addendum)
10 lb weight loss noted - seems euvolemic.  Continues lasix 40mg  bid.

## 2020-06-05 NOTE — Assessment & Plan Note (Signed)
EGD WNL 03/2020. Colonoscopy with colon ulcer.  Will drop pantoprazole to 40mg  once daily.

## 2020-06-05 NOTE — Assessment & Plan Note (Signed)
Without photopsia. Overdue for eye exam - will refer for diabetic eye exam and further evaluation of increase in floaters.

## 2020-06-17 DIAGNOSIS — H40003 Preglaucoma, unspecified, bilateral: Secondary | ICD-10-CM | POA: Diagnosis not present

## 2020-06-17 LAB — HM DIABETES EYE EXAM

## 2020-06-18 NOTE — Chronic Care Management (AMB) (Addendum)
Contacted Mr. Elahi to follow up on patient assistance application on Farxiga. Left patient voicemail to return my call.    Follow-Up:  Pharmacist Review  Debbora Dus, CPP notified  Margaretmary Dys, [place] Pharmacy Assistant [phone]

## 2020-06-18 NOTE — Chronic Care Management (AMB) (Addendum)
Patient returned call stating he has not signed patient assistance from or provided income documentation. Patient will come by Dr. Bosie Clos office 06/24/20 to provide income information. Patient notes that INCRUSE ELLIPTA 62.5 MCG/INH AEPB has been life changing for him. He is actually able to get out and walk with out being completely winded.   Follow-Up:  Patient Assistance Coordination and Pharmacist Review  Debbora Dus, CPP notified  Margaretmary Dys, [place] Pharmacy Assistant [phone]

## 2020-06-23 ENCOUNTER — Telehealth: Payer: Self-pay | Admitting: Family Medicine

## 2020-06-23 NOTE — Telephone Encounter (Signed)
Error

## 2020-06-24 ENCOUNTER — Ambulatory Visit (INDEPENDENT_AMBULATORY_CARE_PROVIDER_SITE_OTHER): Payer: Medicare Other | Admitting: Family Medicine

## 2020-06-24 ENCOUNTER — Other Ambulatory Visit: Payer: Self-pay

## 2020-06-24 ENCOUNTER — Encounter: Payer: Self-pay | Admitting: Family Medicine

## 2020-06-24 VITALS — BP 120/70 | HR 62 | Temp 97.8°F | Ht 71.0 in | Wt 295.2 lb

## 2020-06-24 DIAGNOSIS — R519 Headache, unspecified: Secondary | ICD-10-CM

## 2020-06-24 DIAGNOSIS — H43392 Other vitreous opacities, left eye: Secondary | ICD-10-CM

## 2020-06-24 DIAGNOSIS — E1165 Type 2 diabetes mellitus with hyperglycemia: Secondary | ICD-10-CM | POA: Diagnosis not present

## 2020-06-24 DIAGNOSIS — IMO0002 Reserved for concepts with insufficient information to code with codable children: Secondary | ICD-10-CM

## 2020-06-24 DIAGNOSIS — G4733 Obstructive sleep apnea (adult) (pediatric): Secondary | ICD-10-CM | POA: Diagnosis not present

## 2020-06-24 DIAGNOSIS — J449 Chronic obstructive pulmonary disease, unspecified: Secondary | ICD-10-CM

## 2020-06-24 DIAGNOSIS — F410 Panic disorder [episodic paroxysmal anxiety] without agoraphobia: Secondary | ICD-10-CM

## 2020-06-24 DIAGNOSIS — E118 Type 2 diabetes mellitus with unspecified complications: Secondary | ICD-10-CM

## 2020-06-24 LAB — CBC WITH DIFFERENTIAL/PLATELET
Basophils Absolute: 0.1 10*3/uL (ref 0.0–0.1)
Basophils Relative: 0.8 % (ref 0.0–3.0)
Eosinophils Absolute: 0.1 10*3/uL (ref 0.0–0.7)
Eosinophils Relative: 1.7 % (ref 0.0–5.0)
HCT: 39.5 % (ref 39.0–52.0)
Hemoglobin: 12.2 g/dL — ABNORMAL LOW (ref 13.0–17.0)
Lymphocytes Relative: 22.2 % (ref 12.0–46.0)
Lymphs Abs: 1.9 10*3/uL (ref 0.7–4.0)
MCHC: 31 g/dL (ref 30.0–36.0)
MCV: 78.4 fl (ref 78.0–100.0)
Monocytes Absolute: 0.7 10*3/uL (ref 0.1–1.0)
Monocytes Relative: 8.2 % (ref 3.0–12.0)
Neutro Abs: 5.8 10*3/uL (ref 1.4–7.7)
Neutrophils Relative %: 67.1 % (ref 43.0–77.0)
Platelets: 224 10*3/uL (ref 150.0–400.0)
RBC: 5.03 Mil/uL (ref 4.22–5.81)
RDW: 18.5 % — ABNORMAL HIGH (ref 11.5–15.5)
WBC: 8.7 10*3/uL (ref 4.0–10.5)

## 2020-06-24 LAB — C-REACTIVE PROTEIN: CRP: 1.1 mg/dL (ref 0.5–20.0)

## 2020-06-24 LAB — SEDIMENTATION RATE: Sed Rate: 20 mm/hr (ref 0–20)

## 2020-06-24 NOTE — Assessment & Plan Note (Addendum)
3d h/o significant R temporal headache with radiation down into ear and posterior R neck. H/o cervicogenic headaches.  Need to r/o GCA - check ESR/CRP.  Doesn't sound like trigeminal neuralgia or like sinusitis related symptomatology.

## 2020-06-24 NOTE — Assessment & Plan Note (Signed)
Received reassuring eye evaluation at [facility] - awaiting report.

## 2020-06-24 NOTE — Assessment & Plan Note (Signed)
Severe, not receiving treatment. Previously unable to tolerate CPAP mask.  Encouraged he call pulm back to schedule OV to review alternatives to CPAP. He may be interested in upper airway stimulation implantable device.

## 2020-06-24 NOTE — Patient Instructions (Addendum)
Possible temporal arteritis Labs today  We will be in touch with results.

## 2020-06-24 NOTE — Assessment & Plan Note (Signed)
Doing much better since starting incruse ellipta - continue this. Affording better than spiriva.

## 2020-06-24 NOTE — Progress Notes (Signed)
Patient ID: [name], male    DOB: August 05, 1955, 65 y.o.   MRN: [id number]  This visit was conducted in person.  BP 120/70   Pulse 62   Temp 97.8 F (36.6 C) (Temporal)   Ht 5' 11" (1.803 m)   Wt 295 lb 4 oz (133.9 kg)   SpO2 94%   BMI 41.18 kg/m    CC: R ear pain  Subjective:   HPI: [name] is a 65 y.o. male presenting on 06/24/2020 for Ear Pain (C/o right ear pain. Pain starts near right eye, radiates around to ear and down back of neck. Started 3 days ago.  Tried ear drops. )   3d h/o R temporal headache - pain starts behind R eye, travels to ear and down R neck. Pain described as constant sharp stabbing pain that can last minutes, reproducible with certain movements, sometimes pressure. Feels like tooth ache (has full dentures).  No fevers/chills, significant nasal congestion or rhinorrhea.  Treating with flonase and excedrin (unsure how helpful).   DM - pending farxiga PAP application - he brought in proof of income so forms will be submitted. Reassuring recent eye exam - no glaucoma, no diabetic retinopathy, good vision.   Dyspnea - significant improvement since starting incruse ellipta points to COPD as etiology.     Relevant past medical, surgical, family and social history reviewed and updated as indicated. Interim medical history since our last visit reviewed. Allergies and medications reviewed and updated. Outpatient Medications Prior to Visit  Medication Sig Dispense Refill  . ACCU-CHEK FASTCLIX LANCETS MISC Check blood sugar once daily and as instructed. Dx 250.00 100 each 3  . albuterol (PROVENTIL) (2.5 MG/3ML) 0.083% nebulizer solution Take 3 mLs (2.5 mg total) by nebulization every 6 (six) hours as needed for wheezing or shortness of breath. 150 mL 1  . albuterol (VENTOLIN HFA) 108 (90 Base) MCG/ACT inhaler INHALE 2 PUFFS BY MOUTH EVERY 6 HOURS AS NEEDED FOR WHEEZE OR SHORTNESS OF BREATH 18 g 1  . aspirin EC 81 MG tablet Take 81 mg by mouth  daily.    . Blood Glucose Monitoring Suppl (BLOOD GLUCOSE MONITOR SYSTEM) W/DEVICE KIT by Does not apply route. 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 carvedilol (COREG) 6.25 MG tablet TAKE 1 TABLET BY MOUTH 2 TIMES DAILY WITH A MEAL. 180 tablet 0  . Cholecalciferol (VITAMIN D) 50 MCG (2000 UT) CAPS Take 1 capsule (2,000 Units total) by mouth daily. 30 capsule   . citalopram (CELEXA) 10 MG tablet TAKE 1 TABLET BY MOUTH EVERY DAY 90 tablet 0  . Cyanocobalamin (B-12) 1000 MCG SUBL Place 1 tablet under the tongue daily.    . cyclobenzaprine (FLEXERIL) 10 MG tablet Take 0.5-1 tablets (5-10 mg total) by mouth 2 (two) times daily as needed for muscle spasms (sedation precautions). 30 tablet 0  . dapagliflozin propanediol (FARXIGA) 5 MG TABS tablet Take 1 tablet (5 mg total) by mouth daily before breakfast. 30 tablet 3  . ferrous sulfate 325 (65 FE) MG tablet Take 1 tablet (325 mg total) by mouth daily with breakfast.    . fluticasone (FLONASE) 50 MCG/ACT nasal spray SPRAY 2 SPRAYS INTO EACH NOSTRIL EVERY DAY (Patient taking differently: As needed) 16 mL 3  . furosemide (LASIX) 40 MG tablet TAKE 1 TABLET BY MOUTH TWICE A DAY 180 tablet 1  . glimepiride (AMARYL) 4 MG tablet Take 1 tablet (4  mg total) by mouth daily with breakfast. 90 tablet 3  . hydrOXYzine (ATARAX/VISTARIL) 50 MG tablet TAKE 1 TABLET (50 MG TOTAL) BY MOUTH 2 (TWO) TIMES DAILY AS NEEDED FOR ANXIETY. 180 tablet 1  . isosorbide mononitrate (IMDUR) 30 MG 24 hr tablet Take by mouth.    . metFORMIN (GLUCOPHAGE) 1000 MG tablet TAKE 1 TABLET (1,000 MG TOTAL) BY MOUTH 2 (TWO) TIMES DAILY WITH A MEAL. 180 tablet 0  . montelukast (SINGULAIR) 10 MG tablet TAKE 1 TABLET BY MOUTH EVERYDAY AT BEDTIME 90 tablet 0  . ONETOUCH VERIO test strip USE AS INSTRUCTED TO CHECK THREE TIMES DAILY AND AS NEEDED. E11.8 300 strip 3  . pantoprazole (PROTONIX) 40 MG tablet Take 1 tablet (40 mg total) by mouth daily. 90 tablet 3  .  Probiotic Product (PROBIOTIC DAILY PO) Take by mouth.    . simvastatin (ZOCOR) 20 MG tablet TAKE 1 TABLET BY MOUTH EVERY DAY IN THE EVENING 90 tablet 2  . umeclidinium bromide (INCRUSE ELLIPTA) 62.5 MCG/INH AEPB Inhale 1 puff into the lungs daily. 30 each 3   No facility-administered medications prior to visit.     Per HPI unless specifically indicated in ROS section below Review of Systems Objective:  BP 120/70   Pulse 62   Temp 97.8 F (36.6 C) (Temporal)   Ht 5' 11" (1.803 m)   Wt 295 lb 4 oz (133.9 kg)   SpO2 94%   BMI 41.18 kg/m   Wt Readings from Last 3 Encounters:  06/24/20 295 lb 4 oz (133.9 kg)  06/04/20 289 lb 1 oz (131.1 kg)  04/14/20 299 lb 3 oz (135.7 kg)      Physical Exam Vitals and nursing note reviewed.  Constitutional:      General: He is not in acute distress.    Appearance: Normal appearance. He is well-developed and well-nourished. He is obese. He is not ill-appearing.  HENT:     Head: Normocephalic and atraumatic.     Right Ear: Hearing, tympanic membrane, ear canal and external ear normal.     Left Ear: Hearing, tympanic membrane, ear canal and external ear normal.     Nose: No congestion or rhinorrhea.     Right Sinus: Frontal sinus tenderness (mild) present. No maxillary sinus tenderness.     Left Sinus: No maxillary sinus tenderness or frontal sinus tenderness.     Mouth/Throat:     Mouth: Oropharynx is clear and moist and mucous membranes are normal. Mucous membranes are moist.     Pharynx: Oropharynx is clear. Uvula midline. No oropharyngeal exudate, posterior oropharyngeal edema or posterior oropharyngeal erythema.     Tonsils: No tonsillar abscesses.  Eyes:     General: No scleral icterus.    Extraocular Movements: Extraocular movements intact and EOM normal.     Conjunctiva/sclera: Conjunctivae normal.     Pupils: Pupils are equal, round, and reactive to light.  Cardiovascular:     Rate and Rhythm: Normal rate and regular rhythm.      Pulses: Normal pulses and intact distal pulses.     Heart sounds: Normal heart sounds. No murmur heard.   Pulmonary:     Effort: Pulmonary effort is normal. No respiratory distress.     Breath sounds: Normal breath sounds. No wheezing, rhonchi or rales.  Musculoskeletal:     Cervical back: Normal range of motion and neck supple.  Lymphadenopathy:     Cervical: No cervical adenopathy.  Skin:    General: Skin is warm and  dry.     Findings: No rash.  Neurological:     Mental Status: He is alert.  Psychiatric:        Mood and Affect: Mood normal.        Behavior: Behavior normal.       Assessment & Plan:  This visit occurred during the SARS-CoV-2 public health emergency.  Safety protocols were in place, including screening questions prior to the visit, additional usage of staff PPE, and extensive cleaning of exam room while observing appropriate contact time as indicated for disinfecting solutions.   Problem List Items Addressed This Visit    Vitreous floaters of left eye    Received reassuring eye evaluation at [facility] - awaiting report.       Right temporal headache - Primary    3d h/o significant R temporal headache with radiation down into ear and posterior R neck. H/o cervicogenic headaches.  Need to r/o GCA - check ESR/CRP.  Doesn't sound like trigeminal neuralgia or like sinusitis related symptomatology.       Relevant Orders   Sedimentation rate   CBC with Differential/Platelet   C-reactive protein   Panic attacks    Notes improvement in anxiety since starting incruse ellipta.       OSA (obstructive sleep apnea)    Severe, not receiving treatment. Previously unable to tolerate CPAP mask.  Encouraged he call pulm back to schedule OV to review alternatives to CPAP. He may be interested in upper airway stimulation implantable device.      Diabetes mellitus type 2, uncontrolled, with complications ([place])    Brings proof of income forms to submit PAP for farxiga.        COPD (chronic obstructive pulmonary disease) ([place])    Doing much better since starting incruse ellipta - continue this. Affording better than spiriva.           No orders of the defined types were placed in this encounter.  Orders Placed This Encounter  Procedures  . Sedimentation rate  . CBC with Differential/Platelet  . C-reactive protein    Patient Instructions  Possible temporal arteritis Labs today  We will be in touch with results.    Follow up plan: Return if symptoms worsen or fail to improve.  [name], MD

## 2020-06-24 NOTE — Assessment & Plan Note (Addendum)
Notes improvement in anxiety since starting incruse ellipta.

## 2020-06-24 NOTE — Assessment & Plan Note (Signed)
Brings proof of income forms to submit PAP for farxiga.

## 2020-06-25 ENCOUNTER — Other Ambulatory Visit: Payer: Self-pay | Admitting: Family Medicine

## 2020-06-26 ENCOUNTER — Encounter: Payer: Self-pay | Admitting: Family Medicine

## 2020-06-26 ENCOUNTER — Other Ambulatory Visit: Payer: Self-pay | Admitting: Family Medicine

## 2020-06-26 MED ORDER — PREDNISONE 50 MG PO TABS: 50.0000 mg | ORAL_TABLET | Freq: Every day | ORAL | 0 refills | Status: DC

## 2020-06-27 ENCOUNTER — Encounter: Payer: Self-pay | Admitting: Family Medicine

## 2020-07-03 ENCOUNTER — Telehealth: Payer: Self-pay

## 2020-07-03 NOTE — Chronic Care Management (AMB) (Signed)
Chronic Care Management Pharmacy Assistant   Name: [name]  MRN: [id number] DOB: 06-26-1955   Reason for Encounter: Patient Assistance Application- [place]    Medications: Outpatient Encounter Medications as of 07/03/2020  Medication Sig Note   ACCU-CHEK FASTCLIX LANCETS MISC Check blood sugar once daily and as instructed. Dx 250.00    albuterol (PROVENTIL) (2.5 MG/3ML) 0.083% nebulizer solution Take 3 mLs (2.5 mg total) by nebulization every 6 (six) hours as needed for wheezing or shortness of breath.    albuterol (VENTOLIN HFA) 108 (90 Base) MCG/ACT inhaler INHALE 2 PUFFS BY MOUTH EVERY 6 HOURS AS NEEDED FOR WHEEZE OR SHORTNESS OF BREATH    aspirin EC 81 MG tablet Take 81 mg by mouth daily.    Blood Glucose Monitoring Suppl (BLOOD GLUCOSE MONITOR SYSTEM) W/DEVICE KIT by Does not apply route. Use to check sugar once daily and as needed Dx: E11.9 **ONE TOUCH VERIO**    carvedilol (COREG) 6.25 MG tablet TAKE 1 TABLET BY MOUTH 2 TIMES DAILY WITH A MEAL.    Cholecalciferol (VITAMIN D) 50 MCG (2000 UT) CAPS Take 1 capsule (2,000 Units total) by mouth daily.    citalopram (CELEXA) 10 MG tablet TAKE 1 TABLET BY MOUTH EVERY DAY    Cyanocobalamin (B-12) 1000 MCG SUBL Place 1 tablet under the tongue daily. 06/20/2019: Not SL, takes tablet   cyclobenzaprine (FLEXERIL) 10 MG tablet Take 0.5-1 tablets (5-10 mg total) by mouth 2 (two) times daily as needed for muscle spasms (sedation precautions).    dapagliflozin propanediol (FARXIGA) 5 MG TABS tablet Take 1 tablet (5 mg total) by mouth daily before breakfast.    ferrous sulfate 325 (65 FE) MG tablet Take 1 tablet (325 mg total) by mouth daily with breakfast.    fluticasone (FLONASE) 50 MCG/ACT nasal spray SPRAY 2 SPRAYS INTO EACH NOSTRIL EVERY DAY (Patient taking differently: As needed)    furosemide (LASIX) 40 MG tablet TAKE 1 TABLET BY MOUTH TWICE A DAY    glimepiride (AMARYL) 4 MG tablet Take 1 tablet (4 mg total) by  mouth daily with breakfast.    hydrOXYzine (ATARAX/VISTARIL) 50 MG tablet TAKE 1 TABLET (50 MG TOTAL) BY MOUTH 2 (TWO) TIMES DAILY AS NEEDED FOR ANXIETY.    isosorbide mononitrate (IMDUR) 30 MG 24 hr tablet Take by mouth.    metFORMIN (GLUCOPHAGE) 1000 MG tablet TAKE 1 TABLET (1,000 MG TOTAL) BY MOUTH 2 (TWO) TIMES DAILY WITH A MEAL.    montelukast (SINGULAIR) 10 MG tablet TAKE 1 TABLET BY MOUTH EVERYDAY AT BEDTIME    ONETOUCH VERIO test strip USE AS INSTRUCTED TO CHECK THREE TIMES DAILY AND AS NEEDED. E11.8    pantoprazole (PROTONIX) 40 MG tablet Take 1 tablet (40 mg total) by mouth daily.    predniSONE (DELTASONE) 50 MG tablet Take 1 tablet (50 mg total) by mouth daily with breakfast.    Probiotic Product (PROBIOTIC DAILY PO) Take by mouth.    simvastatin (ZOCOR) 20 MG tablet TAKE 1 TABLET BY MOUTH EVERY DAY IN THE EVENING    umeclidinium bromide (INCRUSE ELLIPTA) 62.5 MCG/INH AEPB Inhale 1 puff into the lungs daily.    No facility-administered encounter medications on file as of 07/03/2020.    Contacted [employer]  to follow up on patient assistance application for [place]. Per representative at [employer] they have not received any portion of the application. Representative created account with patient ID number, [id number]   Follow-Up:  Patient Assistance Coordination and Pharmacist Review  Debbora Dus,  CPP notified  Margaretmary Dys, [place] Assistant [phone]  Total time spent for month: 5

## 2020-07-07 ENCOUNTER — Other Ambulatory Visit: Payer: Self-pay | Admitting: Family Medicine

## 2020-07-07 NOTE — Telephone Encounter (Signed)
Pharmacy requests refill on: Fluticasone 50 mcg/act nasal spray   LAST REFILL: 05/04/2019 (Q-16, R-3) LAST OV: 06/24/2020 NEXT OV: 07/16/2020 PHARMACY: [id number] [place], [place]

## 2020-07-08 ENCOUNTER — Telehealth: Payer: Self-pay | Admitting: *Deleted

## 2020-07-08 NOTE — Telephone Encounter (Signed)
Ginger RN with [employer] left a voicemail stating that the patient participates in a program with them to monitor his blood pressure. Ginger stated that she has tried to contact the patient and has not been able to reach him. Ginger stated that she has left several messages for the patient to call her back and he has not. Ginger stated that the patient's diastolic blood pressure readings have been in the 90's.  Ginger stated the readings that they have received has been 146/92, 158/93, 148/93, 162/96, 175/99 and 176/103. Ginger stated the patient's blood pressure reading today was 140/91. Ginger stated that it looks like the  patient is only taking  Carvedilol 6.25 mg twice a day. Ginger stated that she has tried to contact the patient and has not been able to reach him. Ginger stated that she would like for Dr. Danise Mina to see the patient's readings and  to contact him directly if he wants to make any changes in the patient's medications.

## 2020-07-09 ENCOUNTER — Other Ambulatory Visit: Payer: Self-pay

## 2020-07-09 ENCOUNTER — Other Ambulatory Visit: Payer: Self-pay | Admitting: Family Medicine

## 2020-07-09 ENCOUNTER — Other Ambulatory Visit (INDEPENDENT_AMBULATORY_CARE_PROVIDER_SITE_OTHER): Payer: Medicare Other

## 2020-07-09 ENCOUNTER — Ambulatory Visit (INDEPENDENT_AMBULATORY_CARE_PROVIDER_SITE_OTHER): Payer: Medicare Other

## 2020-07-09 DIAGNOSIS — E559 Vitamin D deficiency, unspecified: Secondary | ICD-10-CM | POA: Diagnosis not present

## 2020-07-09 DIAGNOSIS — E538 Deficiency of other specified B group vitamins: Secondary | ICD-10-CM | POA: Diagnosis not present

## 2020-07-09 DIAGNOSIS — E118 Type 2 diabetes mellitus with unspecified complications: Secondary | ICD-10-CM

## 2020-07-09 DIAGNOSIS — Z125 Encounter for screening for malignant neoplasm of prostate: Secondary | ICD-10-CM

## 2020-07-09 DIAGNOSIS — D509 Iron deficiency anemia, unspecified: Secondary | ICD-10-CM

## 2020-07-09 DIAGNOSIS — E1165 Type 2 diabetes mellitus with hyperglycemia: Secondary | ICD-10-CM | POA: Diagnosis not present

## 2020-07-09 DIAGNOSIS — Z0000 Encounter for general adult medical examination without abnormal findings: Secondary | ICD-10-CM | POA: Diagnosis not present

## 2020-07-09 DIAGNOSIS — D500 Iron deficiency anemia secondary to blood loss (chronic): Secondary | ICD-10-CM

## 2020-07-09 DIAGNOSIS — IMO0002 Reserved for concepts with insufficient information to code with codable children: Secondary | ICD-10-CM

## 2020-07-09 LAB — COMPREHENSIVE METABOLIC PANEL
ALT: 23 U/L (ref 0–53)
AST: 24 U/L (ref 0–37)
Albumin: 4.1 g/dL (ref 3.5–5.2)
Alkaline Phosphatase: 66 U/L (ref 39–117)
BUN: 10 mg/dL (ref 6–23)
CO2: 35 mEq/L — ABNORMAL HIGH (ref 19–32)
Calcium: 9.5 mg/dL (ref 8.4–10.5)
Chloride: 97 mEq/L (ref 96–112)
Creatinine, Ser: 0.72 mg/dL (ref 0.40–1.50)
GFR: 96.34 mL/min (ref >60.00)
Glucose, Bld: 214 mg/dL — ABNORMAL HIGH (ref 70–99)
Potassium: 4.8 mEq/L (ref 3.5–5.1)
Sodium: 139 mEq/L (ref 135–145)
Total Bilirubin: 0.4 mg/dL (ref 0.2–1.2)
Total Protein: 6.6 g/dL (ref 6.0–8.3)

## 2020-07-09 LAB — LIPID PANEL
Cholesterol: 185 mg/dL (ref 0–200)
HDL: 36.7 mg/dL — ABNORMAL LOW (ref >39.00)
Total CHOL/HDL Ratio: 5
Triglycerides: 451 mg/dL — ABNORMAL HIGH (ref 0.0–149.0)

## 2020-07-09 LAB — LDL CHOLESTEROL, DIRECT: Direct LDL: 97 mg/dL

## 2020-07-09 LAB — MICROALBUMIN / CREATININE URINE RATIO
Creatinine,U: 99.2 mg/dL
Microalb Creat Ratio: 0.7 mg/g (ref 0.0–30.0)
Microalb, Ur: <0.7 mg/dL (ref 0.0–1.9)

## 2020-07-09 LAB — IBC PANEL
Iron: 39 ug/dL — ABNORMAL LOW (ref 42–165)
Saturation Ratios: 8.9 % — ABNORMAL LOW (ref 20.0–50.0)
Transferrin: 312 mg/dL (ref 212.0–360.0)

## 2020-07-09 LAB — HEMOGLOBIN A1C: Hgb A1c MFr Bld: 8.7 % — ABNORMAL HIGH (ref 4.6–6.5)

## 2020-07-09 LAB — FERRITIN: Ferritin: 15.9 ng/mL — ABNORMAL LOW (ref 22.0–322.0)

## 2020-07-09 LAB — VITAMIN B12: Vitamin B-12: 596 pg/mL (ref 211–911)

## 2020-07-09 LAB — PSA: PSA: 0.74 ng/mL (ref 0.10–4.00)

## 2020-07-09 LAB — VITAMIN D 25 HYDROXY (VIT D DEFICIENCY, FRACTURES): VITD: 29.46 ng/mL — ABNORMAL LOW (ref 30.00–100.00)

## 2020-07-09 NOTE — Progress Notes (Signed)
Subjective:   [name] is a 65 y.o. male who presents for Medicare Annual/Subsequent preventive examination.  Review of Systems: N/A      I connected with the patient today by telephone and verified that I am speaking with the correct person using two identifiers. Location patient: home Location nurse: work Persons participating in the telephone visit: patient, nurse.   I discussed the limitations, risks, security and privacy concerns of performing an evaluation and management service by telephone and the availability of in person appointments. I also discussed with the patient that there may be a patient responsible charge related to this service. The patient expressed understanding and verbally consented to this telephonic visit.        Cardiac Risk Factors include: advanced age (>105mn, >>48women);diabetes mellitus;hypertension;male gender;Other (see comment), Risk factor comments: hyperlipidemia     Objective:    Today's Vitals   07/09/20 1410  PainSc: 0-No pain   There is no height or weight on file to calculate BMI.  Advanced Directives 07/09/2020 05/22/2020 02/08/2020 12/22/2018 06/05/2018 01/05/2018 10/04/2016  Does Patient Have a Medical Advance Directive? No No No No No No No  Would patient like information on creating a medical advance directive? No - Patient declined No - Patient declined - No - Patient declined No - Patient declined No - Patient declined -  Pre-existing out of facility DNR order (yellow form or pink MOST form) - - - - - - -    Current Medications (verified) Outpatient Encounter Medications as of 07/09/2020  Medication Sig  . ACCU-CHEK FASTCLIX LANCETS MISC Check blood sugar once daily and as instructed. Dx 250.00  . albuterol (PROVENTIL) (2.5 MG/3ML) 0.083% nebulizer solution Take 3 mLs (2.5 mg total) by nebulization every 6 (six) hours as needed for wheezing or shortness of breath.  .Marland Kitchenalbuterol (VENTOLIN HFA) 108 (90 Base) MCG/ACT inhaler  INHALE 2 PUFFS BY MOUTH EVERY 6 HOURS AS NEEDED FOR WHEEZE OR SHORTNESS OF BREATH  . aspirin EC 81 MG tablet Take 81 mg by mouth daily.  . Blood Glucose Monitoring Suppl (BLOOD GLUCOSE MONITOR SYSTEM) W/DEVICE KIT by Does not apply route. Use to check sugar once daily and as needed Dx: E11.9 **ONE TOUCH VERIO**  . carvedilol (COREG) 6.25 MG tablet TAKE 1 TABLET BY MOUTH 2 TIMES DAILY WITH A MEAL.  .Marland KitchenCholecalciferol (VITAMIN D) 50 MCG (2000 UT) CAPS Take 1 capsule (2,000 Units total) by mouth daily.  . citalopram (CELEXA) 10 MG tablet TAKE 1 TABLET BY MOUTH EVERY DAY  . Cyanocobalamin (B-12) 1000 MCG SUBL Place 1 tablet under the tongue daily.  . cyclobenzaprine (FLEXERIL) 10 MG tablet Take 0.5-1 tablets (5-10 mg total) by mouth 2 (two) times daily as needed for muscle spasms (sedation precautions).  . dapagliflozin propanediol (FARXIGA) 5 MG TABS tablet Take 1 tablet (5 mg total) by mouth daily before breakfast.  . ferrous sulfate 325 (65 FE) MG tablet Take 1 tablet (325 mg total) by mouth daily with breakfast.  . fluticasone (FLONASE) 50 MCG/ACT nasal spray SPRAY 2 SPRAYS INTO EACH NOSTRIL EVERY DAY  . furosemide (LASIX) 40 MG tablet TAKE 1 TABLET BY MOUTH TWICE A DAY  . glimepiride (AMARYL) 4 MG tablet Take 1 tablet (4 mg total) by mouth daily with breakfast.  . hydrOXYzine (ATARAX/VISTARIL) 50 MG tablet TAKE 1 TABLET (50 MG TOTAL) BY MOUTH 2 (TWO) TIMES DAILY AS NEEDED FOR ANXIETY.  . isosorbide mononitrate (IMDUR) 30 MG 24 hr tablet Take by mouth.  .Marland Kitchen  metFORMIN (GLUCOPHAGE) 1000 MG tablet TAKE 1 TABLET (1,000 MG TOTAL) BY MOUTH 2 (TWO) TIMES DAILY WITH A MEAL.  . montelukast (SINGULAIR) 10 MG tablet TAKE 1 TABLET BY MOUTH EVERYDAY AT BEDTIME  . ONETOUCH VERIO test strip USE AS INSTRUCTED TO CHECK THREE TIMES DAILY AND AS NEEDED. E11.8  . pantoprazole (PROTONIX) 40 MG tablet Take 1 tablet (40 mg total) by mouth daily.  . predniSONE (DELTASONE) 50 MG tablet Take 1 tablet (50 mg total) by mouth  daily with breakfast.  . Probiotic Product (PROBIOTIC DAILY PO) Take by mouth.  . simvastatin (ZOCOR) 20 MG tablet TAKE 1 TABLET BY MOUTH EVERY DAY IN THE EVENING  . umeclidinium bromide (INCRUSE ELLIPTA) 62.5 MCG/INH AEPB Inhale 1 puff into the lungs daily.   No facility-administered encounter medications on file as of 07/09/2020.    Allergies (verified) Atorvastatin and Lisinopril   History: Past Medical History:  Diagnosis Date  . Abnormal drug screen 2015   MJ positive x3 - one more and we will stop prescribing ativan (08/2013).  . Angina   . Anxiety   . Arthritis   . CAD (coronary artery disease)    nonobstructive  . Cannabis abuse   . Chronic systolic CHF (congestive heart failure) ([place]) 2013   NYHA Class II/III  . COPD (chronic obstructive pulmonary disease) ([place])   . Diabetes type 2, uncontrolled ([place])    declines DSME  . Dilated cardiomyopathy ([place]) 2013   now improved  . Ex-smoker   . Frequent headaches   . History of atrial fibrillation 2013   chronic, s/p ablation prior on coumadin  . Hyperlipidemia   . Migraine   . Nonischemic cardiomyopathy ([place]) 2013   EF 25% per Dr Nehemiah Massed  . Obesity   . OSA (obstructive sleep apnea)    does not use CPAP - unable to tolerate  . Seasonal allergies    Past Surgical History:  Procedure Laterality Date  . ATRIAL FLUTTER ABLATION N/A 09/21/2011   Procedure: ATRIAL FLUTTER ABLATION;  Surgeon: Thompson Grayer, MD;  [facility] C[facility] LAB;  Service: Cardiovascular;  Laterality: N/A;  . CARDIAC ELECTROPHYSIOLOGY [place] AND ABLATION  2013   for atrial flutter  . CARDIOVASCULAR STRESS TEST  03/2012   ETT WNL Nehemiah Massed)  . COLONOSCOPY WITH PROPOFOL N/A 05/22/2020   TA, diverticulosis, descending colon ulcer biopsy WNL, int hem Allen Norris, Darren, MD)  . ESOPHAGOGASTRODUODENOSCOPY (EGD) WITH PROPOFOL N/A 05/22/2020   WNL ([place])  . US ECHOCARDIOGRAPHY  2014   EF 50%, nl LV fxn, RV nl size/function, mild mitral insuff   Family History   Problem Relation Age of Onset  . Heart failure Father 59  . Cancer Mother 92       NHL  . Hypertension Maternal Grandfather   . CAD Maternal Grandfather 41       MI  . Diabetes Other        grandparents  . Cancer Brother 53       lung (smoker)   Social History   Socioeconomic History  . Marital status: Single    Spouse name: Not on file  . Number of children: 0  . Years of education: Not on file  . Highest education level: Not on file  Occupational History  . Occupation: disable   Tobacco Use  . Smoking status: Former Smoker    Packs/day: 1.00    Years: 31.00    Pack years: 31.00    Types: Cigarettes    Quit date: 04/26/2018  Years since quitting: 2.2  . Smokeless tobacco: Never Used  Vaping Use  . Vaping Use: Never used  Substance and Sexual Activity  . Alcohol use: No    Alcohol/week: 0.0 standard drinks  . Drug use: Yes    Types: Marijuana    Comment: 05/18/20  . Sexual activity: Yes  Other Topics Concern  . 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 Difficulty of Paying Living Expenses: Not hard at all  Food Insecurity: No Food Insecurity  . Worried About Charity fundraiser in the Last Year: Never true  . Ran Out of Food in the Last Year: Never true  Transportation Needs: No Transportation Needs  . Lack of Transportation (Medical): No  . Lack of Transportation (Non-Medical): No  Physical Activity: Inactive  . Days of Exercise per Week: 0 days  . Minutes of Exercise per Session: 0 min  Stress: No Stress Concern Present  . Feeling of Stress : Not at all  Social Connections: Not on file    Tobacco Counseling Counseling given: Not  Answered   Clinical Intake:  Pre-visit preparation completed: Yes  Pain : No/denies pain Pain Score: 0-No pain     Nutritional Risks: None Diabetes: Yes CBG done?: No Did pt. bring in CBG monitor from home?: No  How often do you need to have someone help you when you read instructions, pamphlets, or other written materials from your doctor or pharmacy?: 1 - Never  Diabetic: Yes Nutrition Risk Assessment:  Has the patient had any N/V/D within the last 2 months?  No  Does the patient have any non-healing wounds?  No  Has the patient had any unintentional weight loss or weight gain?  No   Diabetes:  Is the patient diabetic?  Yes  If diabetic, was a CBG obtained today?  No  telephone visit  Did the patient bring in their glucometer from home?  No  telephone visit  How often do you monitor your CBG's? Everyday.   Financial Strains and Diabetes Management:  Are you having any financial strains with the device, your supplies or your medication? No .  Does the patient want to be seen by Chronic Care Management for management of their diabetes?  No  Would the patient like to be referred to a Nutritionist or for Diabetic Management?  No   Diabetic Exams:  Diabetic Eye Exam: Completed 06/17/2020 Diabetic Foot Exam: Overdue, Pt has been advised about the importance in completing this exam. Pt is scheduled for diabetic foot exam on 07/16/2020.   Interpreter Needed?: No  Information entered by :: CJohnson, LPN   Activities of Daily Living In your present state of health, do you have any difficulty performing the following activities: 07/09/2020  Hearing? N  Vision? Y  Difficulty concentrating or making decisions? N  Walking or climbing stairs? N  Dressing or bathing? N  Doing errands, shopping? N  Preparing Food and eating ? N  Using the Toilet? N  In the past six months, have you accidently leaked urine? N  Do you have problems with loss of bowel control? N  Managing  your  Medications? N  Managing your Finances? N  Housekeeping or managing your Housekeeping? N  Some recent data might be hidden    Patient Care Team: [name], MD as PCP - General (Family Medicine) Thompson Grayer, MD as Attending Physician (Cardiology) Corey Skains, MD as Referring Physician (Internal Medicine) Debbora Dus, [facility] as Pharmacist (Pharmacist)  Indicate any recent Medical Services you may have received from other than [facility] providers in the past year (date may be approximate).     Assessment:   This is a routine wellness examination for Namon.  Hearing/Vision screen  Hearing Screening   '125Hz'  '250Hz'  '500Hz'  '1000Hz'  '2000Hz'  '3000Hz'  '4000Hz'  '6000Hz'  '8000Hz'   Right ear:           Left ear:           Vision Screening Comments: Advised patient to get yearly eye exams   Dietary issues and exercise activities discussed: Current Exercise Habits: The patient does not participate in regular exercise at present, Exercise limited by: respiratory conditions(s)  Goals    .  Patient Stated      07/09/2020, I will maintain and continue medications as prescribed.     .  Pharmacy Care Plan      Current Barriers:  . Chronic Disease Management support, education, and care coordination needs related to atrial fibrillation/flutter, hypertension, coronary artery disease, heart failure, restrictive lung disease, chronic bronchitis, hyperlipidemia, type 2 diabetes, chronic pain, panic attacks, vitamin D deficiency  Pharmacist Clinical Goal(s):  Over the next 4 weeks, patient will work with PharmD and primary care provider to address the following goals: Marland Kitchen Diabetes: Achieve control of blood sugars with goal A1c < 7%.  . Weight Loss: Achieve weight goal of 240 lbs. Current weight 262 lbs.  . Atrial fibrillation: Reduce risk of stroke with atrial fibrillation by starting appropriate therapy . Vaccinations: Remain up to date on vaccinations.  . Cholesterol: Improve cholesterol within  goal of less than 70 mg/dL. Current LDL: 86 mg/dL . Allergies: Maintain allergy symptom control with safe OTC product use.  Marland Kitchen Heart failure: Prevent shortness of breath and fluid overload.   . Iron Deficiency: Assess efficacy of iron and vitamin D supplementation.   Interventions: . Comprehensive medication review performed. . Discussed diet and exercise goals for heart health/weight loss.  . Increase glimepiride 2 mg to 2 tablets daily with breakfast and continue metformin 1000 mg twice daily with meals.  . Recommend annual comprehensive foot exam and eye exam for Diabetes. . Recommend tetanus (Tdap) vaccine and shingles (Shingrix) vaccine. . Recommend starting warfarin therapy for atrial fibrillation pending labs/colonoscopy.  . Recommend switching to a higher potency statin such as rosuvastatin 20 mg or adding ezetimibe pending lipid panel.  . Recommend repeat lab monitoring for iron and vitamin D.  Patient Self Care Activities:  . Increase glimepiride 2 mg to 2 tablets daily with breakfast  . Continue to monitor blood glucose twice daily  . Call if any hypoglycemia (BG < 70 mg/dL) . Work towards increasing physical activity with goal of 30 minutes, 5 days per week . Schedule lab appointment and virtual office visit with Dr. Darnell Level to discuss starting warfarin to prevent strokes . Continue to check your weight daily and call if weight gain of more than 3 pounds overnight . Switch diphenhydramine for allergies to safer alternative such as cetirizine 10 mg daily . Check with local pharmacy for shingles and tetanus vaccines  Please see past updates related to this goal by clicking on the "  Past Updates" button in the selected goal     .  weight management (pt-stated)      Starting 10/04/2016, I will continue to increase intake of fish and vegetables as well as decrease intake of sugar and bread in an effort to lose weight. Target weight is 250 lbs.       Depression Screen PHQ 2/9 Scores  07/09/2020 01/05/2018 10/17/2017 10/04/2016 11/06/2015 10/16/2014 09/03/2013  PHQ - 2 Score 0 0 0 0 0 1 0  PHQ- 9 Score 0 - - - - - -    Fall Risk Fall Risk  07/09/2020 12/15/2018 12/15/2018 01/05/2018 10/17/2017  Falls in the past year? 0 0 0 Yes Yes  Comment - - - - -  Number falls in past yr: 0 - - 2 or more 2 or more  Injury with Fall? 0 - - No No  Risk Factor Category  - - - High Fall Risk -  Risk for fall due to : Medication side effect - - - -  Follow up Falls evaluation completed;Falls prevention discussed - - - -    FALL RISK PREVENTION PERTAINING TO THE HOME:  Any stairs in or around the home? Yes  If so, are there any without handrails? No  Home free of loose throw rugs in walkways, pet beds, electrical cords, etc? Yes  Adequate lighting in your home to reduce risk of falls? Yes   ASSISTIVE DEVICES UTILIZED TO PREVENT FALLS:  Life alert? No  Use of a cane, walker or w/c? No  Grab bars in the bathroom? No  Shower chair or bench in shower? No  Elevated toilet seat or a handicapped toilet? No   TIMED UP AND GO:  Was the test performed? N/A telephone visit .   Cognitive Function: MMSE - Mini Mental State Exam 07/09/2020 10/04/2016  Orientation to time 5 5  Orientation to Place 5 5  Registration 3 3  Attention/ Calculation 5 0  Recall 3 3  Language- name 2 objects - 0  Language- repeat 1 1  Language- follow 3 step command - 3  Language- read & follow direction - 0  Write a sentence - 0  Copy design - 0  Total score - 20  Mini Cog  Mini-Cog screen was completed. Maximum score is 22. A value of 0 denotes this part of the MMSE was not completed or the patient failed this part of the Mini-Cog screening.       Immunizations Immunization History  Administered Date(s) Administered  . Influenza,inj,Quad PF,6+ Mos 05/29/2014, 02/21/2015, 02/22/2017, 03/16/2018, 01/02/2019, 01/04/2020  . Influenza-Unspecified 01/24/2013  . PFIZER(Purple Top)SARS-COV-2 Vaccination 07/12/2019,  08/02/2019, 03/19/2020  . Pneumococcal Polysaccharide-23 02/24/2013  . Td 04/26/2006    TDAP status: Due, Education has been provided regarding the importance of this vaccine. Advised may receive this vaccine at local pharmacy or Health Dept. Aware to provide a copy of the vaccination record if obtained from local pharmacy or Health Dept. Verbalized acceptance and understanding.  Flu Vaccine status: Up to date  Pneumococcal vaccine status: Up to date  Covid-19 vaccine status: Completed vaccines  Qualifies for Shingles Vaccine? Yes   Zostavax completed No   Shingrix Completed?: No.    Education has been provided regarding the importance of this vaccine. Patient has been advised to call insurance company to determine out of pocket expense if they have not yet received this vaccine. Advised may also receive vaccine at local pharmacy or Health Dept. Verbalized acceptance and understanding.  Screening Tests Health Maintenance  Topic Date Due  . FOOT EXAM  06/04/2017  . TETANUS/TDAP  04/25/2026 (Originally 04/26/2016)  . HEMOGLOBIN A1C  01/09/2021  . OPHTHALMOLOGY EXAM  06/17/2021  . URINE MICROALBUMIN  07/09/2021  . COLONOSCOPY (Pts 45-88yr Insurance coverage will need to be confirmed)  05/23/2027  . INFLUENZA VACCINE  Completed  . COVID-19 Vaccine  Completed  . Hepatitis C Screening  Completed  . HIV Screening  Completed  . HPV VACCINES  Aged Out    Health Maintenance  Health Maintenance Due  Topic Date Due  . FOOT EXAM  06/04/2017    Colorectal cancer screening: Type of screening: Colonoscopy. Completed 05/22/2020. Repeat every 7 years  Lung Cancer Screening: (Low Dose CT Chest recommended if Age 65-80years, 30 pack-year currently smoking OR have quit w/in 15years.) does not qualify.  Additional Screening:  Hepatitis C Screening: does qualify; Completed 10/04/2016  Vision Screening: Recommended annual ophthalmology exams for early detection of glaucoma and other disorders  of the eye. Is the patient up to date with their annual eye exam?  No  Who is the provider or what is the name of the office in which the patient attends annual eye exams? Wears his [employer] glasses and they work just fine If pt is not established with a provider, would they like to be referred to a provider to establish care? No .   Dental Screening: Recommended annual dental exams for proper oral hygiene  Community Resource Referral / Chronic Care Management: CRR required this visit?  No   CCM required this visit?  No      Plan:     I have personally reviewed and noted the following in the patient's chart:   . Medical and social history . Use of alcohol, tobacco or illicit drugs  . Current medications and supplements . Functional ability and status . Nutritional status . Physical activity . Advanced directives . List of other physicians . Hospitalizations, surgeries, and ER visits in previous 12 months . Vitals . Screenings to include cognitive, depression, and falls . Referrals and appointments  In addition, I have reviewed and discussed with patient certain preventive protocols, quality metrics, and best practice recommendations. A written personalized care plan for preventive services as well as general preventive health recommendations were provided to patient.   Due to this being a telephonic visit, the after visit summary with patients personalized plan was offered to patient via office or my-chart. Patient preferred to pick up at office at next visit or via mychart.   JAndrez Grime LPN   [id number]

## 2020-07-09 NOTE — Patient Instructions (Signed)
[name] , Thank you for taking time to come for your Medicare Wellness Visit. I appreciate your ongoing commitment to your health goals. Please review the following plan we discussed and let me know if I can assist you in the future.   Screening recommendations/referrals: Colonoscopy: Up to date, completed 05/22/2020, due 04/2027 Recommended yearly ophthalmology/optometry visit for glaucoma screening and checkup Recommended yearly dental visit for hygiene and checkup  Vaccinations: Influenza vaccine: Up to date, completed 01/04/2020, due 11/2020 Pneumococcal vaccine: due at age 20  Tdap vaccine: decline-insurance  Shingles vaccine: due, check with your insurance regarding coverage if interested    Covid-19: Completed series  Advanced directives: Advance directive discussed with you today. Even though you declined this today please call our office should you change your mind and we can give you the proper paperwork for you to fill out.  Conditions/risks identified: diabetes, hypertension, hyperlipidemia   Next appointment: Follow up in one year for your annual wellness visit.   Preventive Care 40-64 Years and Older, Male Preventive care refers to lifestyle choices and visits with your health care provider that can promote health and wellness. What does preventive care include?  A yearly physical exam. This is also called an annual well check.  Dental exams once or twice a year.  Routine eye exams. Ask your health care provider how often you should have your eyes checked.  Personal lifestyle choices, including:  Daily care of your teeth and gums.  Regular physical activity.  Eating a healthy diet.  Avoiding tobacco and drug use.  Limiting alcohol use.  Practicing safe sex.  Taking low doses of aspirin every day.  Taking vitamin and mineral supplements as recommended by your health care provider. What happens during an annual well check? The services and screenings done  by your health care provider during your annual well check will depend on your age, overall health, lifestyle risk factors, and family history of disease. Counseling  Your health care provider may ask you questions about your:  Alcohol use.  Tobacco use.  Drug use.  Emotional well-being.  Home and relationship well-being.  Sexual activity.  Eating habits.  History of falls.  Memory and ability to understand (cognition).  Work and work Statistician. Screening  You may have the following tests or measurements:  Height, weight, and BMI.  Blood pressure.  Lipid and cholesterol levels. These may be checked every 5 years, or more frequently if you are over 65 years old.  Skin check.  Lung cancer screening. You may have this screening every year starting at age 74 if you have a 30-pack-year history of smoking and currently smoke or have quit within the past 15 years.  Fecal occult blood test (FOBT) of the stool. You may have this test every year starting at age 65.  Flexible sigmoidoscopy or colonoscopy. You may have a sigmoidoscopy every 5 years or a colonoscopy every 10 years starting at age 33.  Prostate cancer screening. Recommendations will vary depending on your family history and other risks.  Hepatitis C blood test.  Hepatitis B blood test.  Sexually transmitted disease (STD) testing.  Diabetes screening. This is done by checking your blood sugar (glucose) after you have not eaten for a while (fasting). You may have this done every 1-3 years.  Abdominal aortic aneurysm (AAA) screening. You may need this if you are a current or former smoker.  Osteoporosis. You may be screened starting at age 86 if you are at high risk. Talk  with your health care provider about your test results, treatment options, and if necessary, the need for more tests. Vaccines  Your health care provider may recommend certain vaccines, such as:  Influenza vaccine. This is recommended every  year.  Tetanus, diphtheria, and acellular pertussis (Tdap, Td) vaccine. You may need a Td booster every 10 years.  Zoster vaccine. You may need this after age 23.  Pneumococcal 13-valent conjugate (PCV13) vaccine. One dose is recommended after age 79.  Pneumococcal polysaccharide (PPSV23) vaccine. One dose is recommended after age 61. Talk to your health care provider about which screenings and vaccines you need and how often you need them. This information is not intended to replace advice given to you by your health care provider. Make sure you discuss any questions you have with your health care provider. Document Released: 05/09/2015 Document Revised: 12/31/2015 Document Reviewed: 02/11/2015 Elsevier Interactive Patient Education  2017 [place] Prevention in the Home Falls can cause injuries. They can happen to people of all ages. There are many things you can do to make your home safe and to help prevent falls. What can I do on the outside of my home?  Regularly fix the edges of walkways and driveways and fix any cracks.  Remove anything that might make you trip as you walk through a door, such as a raised step or threshold.  Trim any bushes or trees on the path to your home.  Use bright outdoor lighting.  Clear any walking paths of anything that might make someone trip, such as rocks or tools.  Regularly check to see if handrails are loose or broken. Make sure that both sides of any steps have handrails.  Any raised decks and porches should have guardrails on the edges.  Have any leaves, snow, or ice cleared regularly.  Use sand or salt on walking paths during winter.  Clean up any spills in your garage right away. This includes oil or grease spills. What can I do in the bathroom?  Use night lights.  Install grab bars by the toilet and in the tub and shower. Do not use towel bars as grab bars.  Use non-skid mats or decals in the tub or shower.  If you  need to sit down in the shower, use a plastic, non-slip stool.  Keep the floor dry. Clean up any water that spills on the floor as soon as it happens.  Remove soap buildup in the tub or shower regularly.  Attach bath mats securely with double-sided non-slip rug tape.  Do not have throw rugs and other things on the floor that can make you trip. What can I do in the bedroom?  Use night lights.  Make sure that you have a light by your bed that is easy to reach.  Do not use any sheets or blankets that are too big for your bed. They should not hang down onto the floor.  Have a firm chair that has side arms. You can use this for support while you get dressed.  Do not have throw rugs and other things on the floor that can make you trip. What can I do in the kitchen?  Clean up any spills right away.  Avoid walking on wet floors.  Keep items that you use a lot in easy-to-reach places.  If you need to reach something above you, use a strong step stool that has a grab bar.  Keep electrical cords out of the way.  Do  not use floor polish or wax that makes floors slippery. If you must use wax, use non-skid floor wax.  Do not have throw rugs and other things on the floor that can make you trip. What can I do with my stairs?  Do not leave any items on the stairs.  Make sure that there are handrails on both sides of the stairs and use them. Fix handrails that are broken or loose. Make sure that handrails are as long as the stairways.  Check any carpeting to make sure that it is firmly attached to the stairs. Fix any carpet that is loose or worn.  Avoid having throw rugs at the top or bottom of the stairs. If you do have throw rugs, attach them to the floor with carpet tape.  Make sure that you have a light switch at the top of the stairs and the bottom of the stairs. If you do not have them, ask someone to add them for you. What else can I do to help prevent falls?  Wear shoes  that:  Do not have high heels.  Have rubber bottoms.  Are comfortable and fit you well.  Are closed at the toe. Do not wear sandals.  If you use a stepladder:  Make sure that it is fully opened. Do not climb a closed stepladder.  Make sure that both sides of the stepladder are locked into place.  Ask someone to hold it for you, if possible.  Clearly mark and make sure that you can see:  Any grab bars or handrails.  First and last steps.  Where the edge of each step is.  Use tools that help you move around (mobility aids) if they are needed. These include:  Canes.  Walkers.  Scooters.  Crutches.  Turn on the lights when you go into a dark area. Replace any light bulbs as soon as they burn out.  Set up your furniture so you have a clear path. Avoid moving your furniture around.  If any of your floors are uneven, fix them.  If there are any pets around you, be aware of where they are.  Review your medicines with your doctor. Some medicines can make you feel dizzy. This can increase your chance of falling. Ask your doctor what other things that you can do to help prevent falls. This information is not intended to replace advice given to you by your health care provider. Make sure you discuss any questions you have with your health care provider. Document Released: 02/06/2009 Document Revised: 09/18/2015 Document Reviewed: 05/17/2014 Elsevier Interactive Patient Education  2017 [employer].

## 2020-07-09 NOTE — Progress Notes (Signed)
PCP notes:  Health Maintenance: Foot exam- due    Abnormal Screenings: none   Patient concerns: none   Nurse concerns: none   Next PCP appt.: 07/16/2020 @ 10:30 am

## 2020-07-09 NOTE — Telephone Encounter (Signed)
Will review with pt at [facility] next week.

## 2020-07-09 NOTE — Addendum Note (Signed)
Addended by: Ellamae Sia on: 07/09/2020 08:22 AM   Modules accepted: Orders

## 2020-07-16 ENCOUNTER — Encounter: Payer: Self-pay | Admitting: Family Medicine

## 2020-07-16 ENCOUNTER — Ambulatory Visit (INDEPENDENT_AMBULATORY_CARE_PROVIDER_SITE_OTHER): Payer: Medicare Other | Admitting: Family Medicine

## 2020-07-16 ENCOUNTER — Other Ambulatory Visit: Payer: Self-pay

## 2020-07-16 VITALS — BP 148/82 | HR 82 | Temp 97.5°F | Ht 71.0 in | Wt 284.0 lb

## 2020-07-16 DIAGNOSIS — Z87891 Personal history of nicotine dependence: Secondary | ICD-10-CM | POA: Diagnosis not present

## 2020-07-16 DIAGNOSIS — Z0001 Encounter for general adult medical examination with abnormal findings: Secondary | ICD-10-CM | POA: Diagnosis not present

## 2020-07-16 DIAGNOSIS — F410 Panic disorder [episodic paroxysmal anxiety] without agoraphobia: Secondary | ICD-10-CM

## 2020-07-16 DIAGNOSIS — G4733 Obstructive sleep apnea (adult) (pediatric): Secondary | ICD-10-CM

## 2020-07-16 DIAGNOSIS — K429 Umbilical hernia without obstruction or gangrene: Secondary | ICD-10-CM

## 2020-07-16 DIAGNOSIS — R519 Headache, unspecified: Secondary | ICD-10-CM | POA: Diagnosis not present

## 2020-07-16 DIAGNOSIS — E1169 Type 2 diabetes mellitus with other specified complication: Secondary | ICD-10-CM

## 2020-07-16 DIAGNOSIS — I5022 Chronic systolic (congestive) heart failure: Secondary | ICD-10-CM

## 2020-07-16 DIAGNOSIS — IMO0002 Reserved for concepts with insufficient information to code with codable children: Secondary | ICD-10-CM

## 2020-07-16 DIAGNOSIS — E785 Hyperlipidemia, unspecified: Secondary | ICD-10-CM

## 2020-07-16 DIAGNOSIS — E118 Type 2 diabetes mellitus with unspecified complications: Secondary | ICD-10-CM | POA: Diagnosis not present

## 2020-07-16 DIAGNOSIS — J449 Chronic obstructive pulmonary disease, unspecified: Secondary | ICD-10-CM

## 2020-07-16 DIAGNOSIS — R0600 Dyspnea, unspecified: Secondary | ICD-10-CM | POA: Diagnosis not present

## 2020-07-16 DIAGNOSIS — Z8679 Personal history of other diseases of the circulatory system: Secondary | ICD-10-CM

## 2020-07-16 DIAGNOSIS — I714 Abdominal aortic aneurysm, without rupture, unspecified: Secondary | ICD-10-CM

## 2020-07-16 DIAGNOSIS — E538 Deficiency of other specified B group vitamins: Secondary | ICD-10-CM

## 2020-07-16 DIAGNOSIS — G4761 Periodic limb movement disorder: Secondary | ICD-10-CM

## 2020-07-16 DIAGNOSIS — I10 Essential (primary) hypertension: Secondary | ICD-10-CM

## 2020-07-16 DIAGNOSIS — E1165 Type 2 diabetes mellitus with hyperglycemia: Secondary | ICD-10-CM

## 2020-07-16 DIAGNOSIS — M5030 Other cervical disc degeneration, unspecified cervical region: Secondary | ICD-10-CM | POA: Diagnosis not present

## 2020-07-16 DIAGNOSIS — D509 Iron deficiency anemia, unspecified: Secondary | ICD-10-CM

## 2020-07-16 DIAGNOSIS — R0609 Other forms of dyspnea: Secondary | ICD-10-CM

## 2020-07-16 DIAGNOSIS — E559 Vitamin D deficiency, unspecified: Secondary | ICD-10-CM

## 2020-07-16 MED ORDER — AMOXICILLIN-POT CLAVULANATE 875-125 MG PO TABS: 1.0000 | ORAL_TABLET | Freq: Two times a day (BID) | ORAL | 0 refills | Status: AC

## 2020-07-16 MED ORDER — UMECLIDINIUM BROMIDE 62.5 MCG/INH IN AEPB: 1.0000 | INHALATION_SPRAY | Freq: Every day | RESPIRATORY_TRACT | 11 refills | Status: DC

## 2020-07-16 MED ORDER — DAPAGLIFLOZIN PROPANEDIOL 10 MG PO TABS: 10.0000 mg | ORAL_TABLET | Freq: Every day | ORAL | 11 refills | Status: DC

## 2020-07-16 MED ORDER — CITALOPRAM HYDROBROMIDE 10 MG PO TABS: 10.0000 mg | ORAL_TABLET | Freq: Every day | ORAL | 3 refills | Status: DC

## 2020-07-16 NOTE — Patient Instructions (Addendum)
If interested, check with pharmacy about new 2 shot shingles series (shingrix). Increase farxiga to 10mg  daily, new dose at pharmacy. For headache - trial prolonged antibiotic course for 3 weeks. Let us know how you do after this.  Return in 3 months for diabetes follow up visit.   Health Maintenance, Male Adopting a healthy lifestyle and getting preventive care are important in promoting health and wellness. Ask your health care provider about:  The right schedule for you to have regular tests and exams.  Things you can do on your own to prevent diseases and keep yourself healthy. What should I know about diet, weight, and exercise? Eat a healthy diet  Eat a diet that includes plenty of vegetables, fruits, low-fat dairy products, and lean protein.  Do not eat a lot of foods that are high in solid fats, added sugars, or sodium.   Maintain a healthy weight Body mass index (BMI) is a measurement that can be used to identify possible weight problems. It estimates body fat based on height and weight. Your health care provider can help determine your BMI and help you achieve or maintain a healthy weight. Get regular exercise Get regular exercise. This is one of the most important things you can do for your health. Most adults should:  Exercise for at least 150 minutes each week. The exercise should increase your heart rate and make you sweat (moderate-intensity exercise).  Do strengthening exercises at least twice a week. This is in addition to the moderate-intensity exercise.  Spend less time sitting. Even light physical activity can be beneficial. Watch cholesterol and blood lipids Have your blood tested for lipids and cholesterol at 65 years of age, then have this test every 5 years. You may need to have your cholesterol levels checked more often if:  Your lipid or cholesterol levels are high.  You are older than 65 years of age.  You are at high risk for heart disease. What should  I know about cancer screening? Many types of cancers can be detected early and may often be prevented. Depending on your health history and family history, you may need to have cancer screening at various ages. This may include screening for:  Colorectal cancer.  Prostate cancer.  Skin cancer.  Lung cancer. What should I know about heart disease, diabetes, and high blood pressure? Blood pressure and heart disease  High blood pressure causes heart disease and increases the risk of stroke. This is more likely to develop in people who have high blood pressure readings, are of African descent, or are overweight.  Talk with your health care provider about your target blood pressure readings.  Have your blood pressure checked: ? Every 3-5 years if you are 70-15 years of age. ? Every year if you are 66 years old or older.  If you are between the ages of 6 and 20 and are a current or former smoker, ask your health care provider if you should have a one-time screening for abdominal aortic aneurysm (AAA). Diabetes Have regular diabetes screenings. This checks your fasting blood sugar level. Have the screening done:  Once every three years after age 66 if you are at a normal weight and have a low risk for diabetes.  More often and at a younger age if you are overweight or have a high risk for diabetes. What should I know about preventing infection? Hepatitis B If you have a higher risk for hepatitis B, you should be screened for this virus.  Talk with your health care provider to find out if you are at risk for hepatitis B infection. Hepatitis C Blood testing is recommended for:  Everyone born from [birth year] through 1965.  Anyone with known risk factors for hepatitis C. Sexually transmitted infections (STIs)  You should be screened each year for STIs, including gonorrhea and chlamydia, if: ? You are sexually active and are younger than 65 years of age. ? You are older than 65 years of age  and your health care provider tells you that you are at risk for this type of infection. ? Your sexual activity has changed since you were last screened, and you are at increased risk for chlamydia or gonorrhea. Ask your health care provider if you are at risk.  Ask your health care provider about whether you are at high risk for HIV. Your health care provider may recommend a prescription medicine to help prevent HIV infection. If you choose to take medicine to prevent HIV, you should first get tested for HIV. You should then be tested every 3 months for as long as you are taking the medicine. Follow these instructions at home: Lifestyle  Do not use any products that contain nicotine or tobacco, such as cigarettes, e-cigarettes, and chewing tobacco. If you need help quitting, ask your health care provider.  Do not use street drugs.  Do not share needles.  Ask your health care provider for help if you need support or information about quitting drugs. Alcohol use  Do not drink alcohol if your health care provider tells you not to drink.  If you drink alcohol: ? Limit how much you have to 0-2 drinks a day. ? Be aware of how much alcohol is in your drink. In the [place] one drink equals one 12 oz bottle of beer (355 mL), one 5 oz glass of wine (148 mL), or one 1 oz glass of hard liquor (44 mL). General instructions  Schedule regular health, dental, and eye exams.  Stay current with your vaccines.  Tell your health care provider if: ? You often feel depressed. ? You have ever been abused or do not feel safe at home. Summary  Adopting a healthy lifestyle and getting preventive care are important in promoting health and wellness.  Follow your health care provider's instructions about healthy diet, exercising, and getting tested or screened for diseases.  Follow your health care provider's instructions on monitoring your cholesterol and blood pressure. This information is not intended to  replace advice given to you by your health care provider. Make sure you discuss any questions you have with your health care provider. Document Revised: 04/05/2018 Document Reviewed: 04/05/2018 Elsevier Patient Education  2021 [employer].

## 2020-07-16 NOTE — Progress Notes (Signed)
Patient ID: [name], male    DOB: 1955/04/28, 65 y.o.   MRN: [id number]  This visit was conducted in person.  BP (!) 148/82 (BP Location: Left Arm, Patient Position: Sitting, Cuff Size: Large)   Pulse 82   Temp (!) 97.5 F (36.4 C) (Temporal)   Ht _0  (1.803 m)   Wt 284 lb (128.8 kg)   SpO2 92% Comment: RA  BMI 39.61 kg/m    CC: CPE Subjective:   HPI: [name] is a 65 y.o. male presenting on 07/16/2020 for Medicare Wellness (Part 2)   Saw health advisor last week for medicare wellness visit. Note reviewed.    No exam data present  Flowsheet Row Clinical Support from 07/09/2020 in [facility]  PHQ-2 Total Score 0      Fall Risk  07/09/2020 12/15/2018 12/15/2018 01/05/2018 10/17/2017  Falls in the past year? 0 0 0 Yes Yes  Comment - - - - -  Number falls in past yr: 0 - - 2 or more 2 or more  Injury with Fall? 0 - - No No  Risk Factor Category  - - - High Fall Risk -  Risk for fall due to : Medication side effect - - - -  Follow up Falls evaluation completed;Falls prevention discussed - - - -   COPD with chronic dyspnea - improvement since starting incruse ellipta. He was able to [facility] lawn without stopping - marked improvement. Low oxygen noted today - improves on recheck.   BP creeping up unsure why.   Central sleep apnea - has been unable to tolerate CPAP machine. Has seen pulm for this. To consider upper airway stimulation implantable device like inspire.   Seen earlier this month with R temporal headache with normal ESR/CRP - thought possibly cervicogenic related vs chronic sinus, continued flexeril. Short prednisone 22m 5d course did not help symptoms at all. Takes excedrin for headache - 2 pills three times a day. Feels like a tooth ache but he has full dentures. Ongoing sinus congestion.   This morning woke up nauseated - attributes to last night's dinner which consisted of vegetables which may have caused more gassiness.    Notes trouble affording farxiga - never heard back from PAP  Preventative: COLONOSCOPY WITH PROPOFOL 05/22/2020 - TA, diverticulosis, descending colon ulcer biopsy WNL, int hem (Allen Norris Darren, MD)  EGD WNL - continues pantoprazole BID Prostate cancer screening - discussed, yearly check  Lung cancer screening - quit 2014. 20+ PY hx. Undergoing lung cancer screening CT, started 03/2019.  Flu shot - yearly COVID vaccine 06/2019, 07/2019, booster 02/2020 Tetanus thinks 2008 Pneumovax per patient done in hospital11/2014 shingrix - discussed, to check with pharmacy Advanced directives - has discussed with family.No children.Unsure who would be HCPOA. (?sister who [place] in [place].Does not want prolonged life support. Packet providedpreviously.  Seat belt use discussed Sunscreen use discussed. No changing moles on skin.  Ex smoker 2014 - occasional cigarette. GF smokes.  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 Interim medical history since our last visit reviewed. Allergies and medications reviewed and updated. Outpatient Medications Prior to Visit  Medication Sig Dispense Refill  . ACCU-CHEK FASTCLIX LANCETS MISC Check blood sugar once daily and as instructed. Dx 250.00 100 each 3  . albuterol (PROVENTIL) (2.5 MG/3ML) 0.083% nebulizer solution Take 3 mLs (2.5 mg total) by nebulization every 6 (six) hours as needed for wheezing or shortness of breath. 150 mL 1  . albuterol (VENTOLIN HFA) 108 (90 Base) MCG/ACT inhaler INHALE 2 PUFFS BY MOUTH EVERY 6 HOURS AS NEEDED FOR WHEEZE OR SHORTNESS OF BREATH 18 g 1  . aspirin EC 81 MG tablet Take 81 mg  by mouth daily.    . Blood Glucose Monitoring Suppl (BLOOD GLUCOSE MONITOR SYSTEM) W/DEVICE KIT by Does not apply route. 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 carvedilol (COREG) 6.25 MG tablet TAKE 1 TABLET BY MOUTH 2 TIMES DAILY WITH A MEAL. 180 tablet 0  . Cholecalciferol (VITAMIN D) 50 MCG (2000 UT) CAPS Take 1 capsule (2,000 Units total) by mouth daily. 30 capsule   . Cyanocobalamin (B-12) 1000 MCG SUBL Place 1 tablet under the tongue daily.    . cyclobenzaprine (FLEXERIL) 10 MG tablet Take 0.5-1 tablets (5-10 mg total) by mouth 2 (two) times daily as needed for muscle spasms (sedation precautions). 30 tablet 0  . ferrous sulfate 325 (65 FE) MG tablet Take 1 tablet (325 mg total) by mouth daily with breakfast.    . fluticasone (FLONASE) 50 MCG/ACT nasal spray SPRAY 2 SPRAYS INTO EACH NOSTRIL EVERY DAY 16 mL 3  . furosemide (LASIX) 40 MG tablet TAKE 1 TABLET BY MOUTH TWICE A DAY 180 tablet 1  . glimepiride (AMARYL) 4 MG tablet Take 1 tablet (4 mg total) by mouth daily with breakfast. 90 tablet 3  . hydrOXYzine (ATARAX/VISTARIL) 50 MG tablet TAKE 1 TABLET (50 MG TOTAL) BY MOUTH 2 (TWO) TIMES DAILY AS NEEDED FOR ANXIETY. 180 tablet 1  . isosorbide mononitrate (IMDUR) 30 MG 24 hr tablet Take by mouth.    . metFORMIN (GLUCOPHAGE) 1000 MG tablet TAKE 1 TABLET (1,000 MG TOTAL) BY MOUTH 2 (TWO) TIMES DAILY WITH A MEAL. 180 tablet 0  . montelukast (SINGULAIR) 10 MG tablet TAKE 1 TABLET BY MOUTH EVERYDAY AT BEDTIME 90 tablet 0  . ONETOUCH VERIO test strip USE AS INSTRUCTED TO CHECK THREE TIMES DAILY AND AS NEEDED. E11.8 300 strip 3  . pantoprazole (PROTONIX) 40 MG tablet Take 1 tablet (40 mg total) by mouth daily. 90 tablet 3  . Probiotic Product (PROBIOTIC DAILY PO) Take by mouth.    . simvastatin (ZOCOR) 20 MG tablet TAKE 1 TABLET BY MOUTH EVERY DAY IN THE EVENING 90 tablet 2  . citalopram (CELEXA) 10 MG tablet TAKE 1 TABLET BY MOUTH EVERY DAY 90 tablet 0  .  dapagliflozin propanediol (FARXIGA) 5 MG TABS tablet Take 1 tablet (5 mg total) by mouth daily before breakfast. 30 tablet 3  . predniSONE (DELTASONE) 50 MG tablet Take 1 tablet (50 mg total) by mouth daily with breakfast. 5 tablet 0  . umeclidinium bromide (INCRUSE ELLIPTA) 62.5 MCG/INH AEPB Inhale 1 puff into the lungs daily. 30 each 3   No facility-administered medications prior to visit.     Per HPI unless specifically indicated in ROS section below Review of Systems  Constitutional: Negative for activity change, appetite change, chills, fatigue, fever and unexpected weight change.  HENT: Negative for hearing loss.   Eyes: Negative for visual disturbance.  Respiratory: Positive for cough and shortness of breath (improved  on incruse ellipta). Negative for chest tightness and wheezing.   Cardiovascular: Negative for chest pain, palpitations and leg swelling.  Gastrointestinal: Positive for abdominal pain. Negative for abdominal distention, blood in stool, constipation, diarrhea, nausea and vomiting.  Genitourinary: Negative for difficulty urinating and hematuria.  Musculoskeletal: Negative for arthralgias, myalgias and neck pain.  Skin: Negative for rash.  Neurological: Positive for headaches. Negative for dizziness, seizures and syncope.  Hematological: Negative for adenopathy. Does not bruise/bleed easily.  Psychiatric/Behavioral: Negative for dysphoric mood. The patient is not nervous/anxious.    Objective:  BP (!) 148/82 (BP Location: Left Arm, Patient Position: Sitting, Cuff Size: Large)   Pulse 82   Temp (!) 97.5 F (36.4 C) (Temporal)   Ht _0  (1.803 m)   Wt 284 lb (128.8 kg)   SpO2 92% Comment: RA  BMI 39.61 kg/m   Wt Readings from Last 3 Encounters:  07/16/20 284 lb (128.8 kg)  06/24/20 295 lb 4 oz (133.9 kg)  06/04/20 289 lb 1 oz (131.1 kg)      Physical Exam Vitals and nursing note reviewed.  Constitutional:      General: He is not in acute distress.     Appearance: Normal appearance. He is well-developed. He is obese. He is not ill-appearing.  HENT:     Head: Normocephalic and atraumatic.     Right Ear: Hearing, tympanic membrane, ear canal and external ear normal.     Left Ear: Hearing, tympanic membrane, ear canal and external ear normal.     Mouth/Throat:     Pharynx: Uvula midline.  Eyes:     General: No scleral icterus.    Extraocular Movements: Extraocular movements intact.     Conjunctiva/sclera: Conjunctivae normal.     Pupils: Pupils are equal, round, and reactive to light.  Neck:     Comments: Kyphosis  Cardiovascular:     Rate and Rhythm: Normal rate and regular rhythm.     Pulses: Normal pulses.          Radial pulses are 2+ on the right side and 2+ on the left side.     Heart sounds: Normal heart sounds. No murmur heard.   Pulmonary:     Effort: Pulmonary effort is normal. No respiratory distress.     Breath sounds: Wheezing (coarse mild) present. No rhonchi or rales.  Abdominal:     General: Abdomen is protuberant. Bowel sounds are normal. There is no distension.     Palpations: Abdomen is soft. There is no mass.     Tenderness: There is generalized abdominal tenderness (mild). There is no guarding or rebound.     Hernia: A hernia is present. Hernia is present in the umbilical area (large, nontender, reducible).  Musculoskeletal:        General: Normal range of motion.     Cervical back: Neck supple.     Right lower leg: No edema.     Left lower leg: No edema.  Lymphadenopathy:     Cervical: No cervical adenopathy.  Skin:    General: Skin is warm and dry.     Findings: No rash.  Neurological:     General: No focal deficit present.     Mental Status: He is alert and oriented to person, place, and time.     Comments: CN grossly intact, station and gait intact  Psychiatric:        Mood and Affect: Mood normal.        Behavior: Behavior normal.  Thought Content: Thought content normal.        Judgment:  Judgment normal.       Results for orders placed or performed in visit on 07/09/20  PSA  Result Value Ref Range   PSA 0.74 0.10 - 4.00 ng/mL  Hemoglobin A1c  Result Value Ref Range   Hgb A1c MFr Bld 8.7 (H) 4.6 - 6.5 %  Lipid panel  Result Value Ref Range   Cholesterol 185 0 - 200 mg/dL   Triglycerides (H) 0.0 - 149.0 mg/dL    188.6 Triglyceride is over 400; calculations on Lipids are invalid.   HDL 36.70 (L) >39.00 mg/dL   Total CHOL/HDL Ratio 5   Comprehensive metabolic panel  Result Value Ref Range   Sodium 139 135 - 145 mEq/L   Potassium 4.8 3.5 - 5.1 mEq/L   Chloride 97 96 - 112 mEq/L   CO2 35 (H) 19 - 32 mEq/L   Glucose, Bld 214 (H) 70 - 99 mg/dL   BUN 10 6 - 23 mg/dL   Creatinine, Ser 7.73 0.40 - 1.50 mg/dL   Total Bilirubin 0.4 0.2 - 1.2 mg/dL   Alkaline Phosphatase 66 39 - 117 U/L   AST 24 0 - 37 U/L   ALT 23 0 - 53 U/L   Total Protein 6.6 6.0 - 8.3 g/dL   Albumin 4.1 3.5 - 5.2 g/dL   GFR 73.66 >81.59 mL/min   Calcium 9.5 8.4 - 10.5 mg/dL  VITAMIN D 25 Hydroxy (Vit-D Deficiency, Fractures)  Result Value Ref Range   VITD 29.46 (L) 30.00 - 100.00 ng/mL  IBC panel  Result Value Ref Range   Iron 39 (L) 42 - 165 ug/dL   Transferrin 470.7 615.1 - 360.0 mg/dL   Saturation Ratios 8.9 (L) 20.0 - 50.0 %  Vitamin B12  Result Value Ref Range   Vitamin B-12 596 211 - 911 pg/mL  Ferritin  Result Value Ref Range   Ferritin 15.9 (L) 22.0 - 322.0 ng/mL  Microalbumin / creatinine urine ratio  Result Value Ref Range   Microalb, Ur <0.7 0.0 - 1.9 mg/dL   Creatinine,U 83.4 mg/dL   Microalb Creat Ratio 0.7 0.0 - 30.0 mg/g  LDL cholesterol, direct  Result Value Ref Range   Direct LDL 97.0 mg/dL   Lab Results  Component Value Date   WBC 8.7 06/24/2020   HGB 12.2 (L) 06/24/2020   HCT 39.5 06/24/2020   MCV 78.4 06/24/2020   PLT 224.0 06/24/2020    Depression screen PHQ 2/9 07/09/2020 01/05/2018 10/17/2017 10/04/2016 11/06/2015  Decreased Interest 0 0 0 0 0  Down,  Depressed, Hopeless 0 0 0 0 0  PHQ - 2 Score 0 0 0 0 0  Altered sleeping 0 - - - -  Tired, decreased energy 0 - - - -  Change in appetite 0 - - - -  Feeling bad or failure about yourself  0 - - - -  Trouble concentrating 0 - - - -  Moving slowly or fidgety/restless 0 - - - -  Suicidal thoughts 0 - - - -  PHQ-9 Score 0 - - - -  Difficult doing work/chores Not difficult at all - - - -  Some recent data might be hidden  No flowsheet data found.  Assessment & Plan:  This visit occurred during the SARS-CoV-2 public health emergency.  Safety protocols were in place, including screening questions prior to the visit, additional usage of staff PPE, and extensive cleaning of exam  room while observing appropriate contact time as indicated for disinfecting solutions.   Problem List Items Addressed This Visit    Ex-smoker    Remains largely abstinent  Ongoing lung cancer screening CT, started 03/2019.       OSA (obstructive sleep apnea)    H/o severe central sleep apnea. Has been unable to tolerate CPAP. Declined return to pulm last visit. Interested in evaluation for upper airway stimulation device like Inspire -consider ENT eval for this.       DDD (degenerative disc disease), cervical   History of atrial flutter    H/o afib, aflutter s/p successful ablation 2013. Not on anticoagulant.       Severe obesity (BMI 35.0-39.9) with comorbidity ([place])    Encouraged healthy diet and lifestyle to affect sustainable weight loss. He has not been following diabetic diet.       Relevant Medications   dapagliflozin propanediol (FARXIGA) 10 MG TABS tablet   Hypertension    Mildly elevated, adequate. Continue carvedilol 6.25mg  bid, lasix 40mg , imdur 30mg  daily  Lisinopril previously caused cough.  Consider ARB addition.       Hyperlipidemia associated with type 2 diabetes mellitus (HCC)    Chronic, stable on simvastatin 20mg . Triglycerides elevated - will reassess with better glycemic control.   The 10-year ASCVD risk score Mikey Bussing DC Brooke Bonito., et al., 2013) is: 34.5%   Values used to calculate the score:     Age: 80 years     Sex: Male     Is Non-Hispanic African American: No     Diabetic: Yes     Tobacco smoker: No     Systolic Blood Pressure: 161 mmHg     Is BP treated: Yes     HDL Cholesterol: 36.7 mg/dL     Total Cholesterol: 185 mg/dL       Relevant Medications   dapagliflozin propanediol (FARXIGA) 10 MG TABS tablet   Panic attacks    Improved on incruse ellipta - consider trial off celexa      Relevant Medications   citalopram (CELEXA) 10 MG tablet   Diabetes mellitus type 2, uncontrolled, with complications (HCC)    Chronic, deteriorated. Notes farxiga Rx is expensive.  He states he has not received notice from PAP application - will see if we can check into this.       Relevant Medications   dapagliflozin propanediol (FARXIGA) 10 MG TABS tablet   Exertional dyspnea    Marked improvement since incruse ellipta started - happy with this change.  Mild ambulatory hypoxia noted on presentation, quickly recovers. If ongoing, consider PRN O2 use.       Umbilical hernia without obstruction and without gangrene   Encounter for general adult medical examination with abnormal findings - Primary    Preventative protocols reviewed and updated unless pt declined. Discussed healthy diet and lifestyle.       Chronic systolic CHF (congestive heart failure) (HCC)    Continue lasix 40mg  bid.       Right temporal headache    Ongoing. Normal ESR/CRP. No improvement on 5 days of prednisone 50mg . All this points against GCA diagnosis.  Previous maxillofacial CT 2020 did show ethmoid and R frontal sinus disease - ?chronic sinusitis, he does get relief with antibiotics. Will Rx augmentin 3 week course and if ongoing refer to ENT for further eval/management of chronic sinusitis.       Relevant Medications   citalopram (CELEXA) 10 MG tablet   AAA (abdominal aortic aneurysm)  without rupture ([place])    Undergoing monitoring, planned rpt [place] 12/2021      COPD (chronic obstructive pulmonary disease) (HCC)    Chronic, stable period on incruse ellipta - doesn't miss a dose given marked benefit noted.       Relevant Medications   umeclidinium bromide (INCRUSE ELLIPTA) 62.5 MCG/INH AEPB   Iron deficiency anemia    Mild. Taking ferrous sulfate orally daily - with some constipation and nausea. Ongoing iron deficiency on labs. Discussed IV iron infusion option - he will consider.       Vitamin D deficiency    Continue vit D 2000 IU daily       Low serum vitamin B12    Chronic, levels repleted with daily oral b12.       PLMD (periodic limb movement disorder)       Meds ordered this encounter  Medications  . umeclidinium bromide (INCRUSE ELLIPTA) 62.5 MCG/INH AEPB    Sig: Inhale 1 puff into the lungs daily.    Dispense:  30 each    Refill:  11  . citalopram (CELEXA) 10 MG tablet    Sig: Take 1 tablet (10 mg total) by mouth daily.    Dispense:  90 tablet    Refill:  3  . dapagliflozin propanediol (FARXIGA) 10 MG TABS tablet    Sig: Take 1 tablet (10 mg total) by mouth daily before breakfast.    Dispense:  30 tablet    Refill:  11  . amoxicillin-clavulanate (AUGMENTIN) 875-125 MG tablet    Sig: Take 1 tablet by mouth 2 (two) times daily for 21 days.    Dispense:  42 tablet    Refill:  0   No orders of the defined types were placed in this encounter.   Patient instructions: If interested, check with pharmacy about new 2 shot shingles series (shingrix). Increase farxiga to 69m daily, new dose at pharmacy. For headache - trial prolonged antibiotic course for 3 weeks. [place] you do after this.  Return in 3 months for diabetes follow up visit.   Follow up plan: Return in about 3 months (around 10/16/2020), or if symptoms worsen or fail to improve, for follow up visit.  JRia Bush MD

## 2020-07-17 ENCOUNTER — Telehealth: Payer: Self-pay | Admitting: Family Medicine

## 2020-07-17 NOTE — Assessment & Plan Note (Addendum)
Remains largely abstinent  Ongoing lung cancer screening CT, started 03/2019.

## 2020-07-17 NOTE — Telephone Encounter (Signed)
Pt seen yesterday for OV stated he hasn't been contacted regarding farxiga PAP. He did return proof of income/paperwork at last visit 06/24/2020.  Can we check on status of application?  Will forward to [place].

## 2020-07-17 NOTE — Assessment & Plan Note (Signed)
Continue vit D 2000 IU daily.  

## 2020-07-17 NOTE — Telephone Encounter (Addendum)
I faxed his proof of income to the program. The program has not received the application. Lattie Haw, do you have his application? If not, we can check to see if its at the front desk.   Debbora Dus, PharmD Clinical Pharmacist [facility] [phone]

## 2020-07-17 NOTE — Assessment & Plan Note (Signed)
Chronic, stable period on incruse ellipta - doesn't miss a dose given marked benefit noted.

## 2020-07-17 NOTE — Assessment & Plan Note (Signed)
Ongoing. Normal ESR/CRP. No improvement on 5 days of prednisone 9m. All this points against GCA diagnosis.  Previous maxillofacial CT 2020 did show ethmoid and R frontal sinus disease - ?chronic sinusitis, he does get relief with antibiotics. Will Rx augmentin 3 week course and if ongoing refer to ENT for further eval/management of chronic sinusitis.

## 2020-07-17 NOTE — Assessment & Plan Note (Signed)
Encouraged healthy diet and lifestyle to affect sustainable weight loss. He has not been following diabetic diet.

## 2020-07-17 NOTE — Assessment & Plan Note (Addendum)
Mildly elevated, adequate. Continue carvedilol 6.25mg  bid, lasix 40mg , imdur 30mg  daily  Lisinopril previously caused cough.  Consider ARB addition.

## 2020-07-17 NOTE — Assessment & Plan Note (Signed)
Continue lasix 40mg  bid.

## 2020-07-17 NOTE — Assessment & Plan Note (Signed)
Chronic, stable on simvastatin 20mg . Triglycerides elevated - will reassess with better glycemic control.  The 10-year ASCVD risk score Mikey Bussing DC Brooke Bonito., et al., 2013) is: 34.5%   Values used to calculate the score:     Age: 65 years     Sex: Male     Is Non-Hispanic African American: No     Diabetic: Yes     Tobacco smoker: No     Systolic Blood Pressure: 657 mmHg     Is BP treated: Yes     HDL Cholesterol: 36.7 mg/dL     Total Cholesterol: 185 mg/dL

## 2020-07-17 NOTE — Assessment & Plan Note (Signed)
Improved on incruse ellipta - consider trial off celexa

## 2020-07-17 NOTE — Chronic Care Management (AMB) (Signed)
Contacted [employer] regarding patients application for Farxiga. They asked for the application to be re-faxed along with patients insurance information. They do have his financial statement.    Follow-Up:  Patient Assistance Coordination and Pharmacist Review  Debbora Dus, CPP notified  Margaretmary Dys, [place] [phone]  Total time spent for month: 20

## 2020-07-17 NOTE — Assessment & Plan Note (Signed)
H/o afib, aflutter s/p successful ablation 2013. Not on anticoagulant.

## 2020-07-17 NOTE — Assessment & Plan Note (Signed)
Preventative protocols reviewed and updated unless pt declined. Discussed healthy diet and lifestyle.  

## 2020-07-17 NOTE — Assessment & Plan Note (Signed)
H/o severe central sleep apnea. Has been unable to tolerate CPAP. Declined return to pulm last visit. Interested in evaluation for upper airway stimulation device like Inspire -consider ENT eval for this.

## 2020-07-17 NOTE — Assessment & Plan Note (Signed)
Marked improvement since incruse ellipta started - happy with this change.  Mild ambulatory hypoxia noted on presentation, quickly recovers. If ongoing, consider PRN O2 use.

## 2020-07-17 NOTE — Assessment & Plan Note (Signed)
Mild. Taking ferrous sulfate orally daily - with some constipation and nausea. Ongoing iron deficiency on labs. Discussed IV iron infusion option - he will consider.

## 2020-07-17 NOTE — Assessment & Plan Note (Addendum)
Chronic, levels repleted with daily oral b12.

## 2020-07-17 NOTE — Assessment & Plan Note (Signed)
Undergoing monitoring, planned rpt [place] 12/2021

## 2020-07-17 NOTE — Assessment & Plan Note (Signed)
Chronic, deteriorated. Notes farxiga Rx is expensive.  He states he has not received notice from PAP application - will see if we can check into this.

## 2020-07-18 NOTE — Telephone Encounter (Addendum)
Oh, ok.  We were waiting for that info.  I went ahead and faxed pt's app today with insurance info.

## 2020-07-18 NOTE — Telephone Encounter (Signed)
Awesome, thank you! Im sorry for the miscommunication.  [name]

## 2020-07-26 ENCOUNTER — Other Ambulatory Visit: Payer: Self-pay | Admitting: Family Medicine

## 2020-08-05 ENCOUNTER — Emergency Department: Payer: Medicare Other

## 2020-08-05 ENCOUNTER — Emergency Department
Admission: EM | Admit: 2020-08-05 | Discharge: 2020-08-05 | Disposition: A | Discharge status: Home / Self Care | Payer: Medicare Other | Attending: Emergency Medicine | Admitting: Emergency Medicine

## 2020-08-05 ENCOUNTER — Other Ambulatory Visit: Payer: Self-pay

## 2020-08-05 ENCOUNTER — Encounter: Payer: Self-pay | Admitting: Family Medicine

## 2020-08-05 ENCOUNTER — Ambulatory Visit (INDEPENDENT_AMBULATORY_CARE_PROVIDER_SITE_OTHER): Payer: Medicare Other | Admitting: Family Medicine

## 2020-08-05 VITALS — BP 136/82 | HR 79 | Temp 97.5°F | Ht 71.0 in | Wt 275.1 lb

## 2020-08-05 DIAGNOSIS — E1165 Type 2 diabetes mellitus with hyperglycemia: Secondary | ICD-10-CM | POA: Diagnosis not present

## 2020-08-05 DIAGNOSIS — Z7951 Long term (current) use of inhaled steroids: Secondary | ICD-10-CM | POA: Diagnosis not present

## 2020-08-05 DIAGNOSIS — Z951 Presence of aortocoronary bypass graft: Secondary | ICD-10-CM | POA: Diagnosis not present

## 2020-08-05 DIAGNOSIS — J449 Chronic obstructive pulmonary disease, unspecified: Secondary | ICD-10-CM | POA: Diagnosis not present

## 2020-08-05 DIAGNOSIS — I5022 Chronic systolic (congestive) heart failure: Secondary | ICD-10-CM | POA: Insufficient documentation

## 2020-08-05 DIAGNOSIS — J441 Chronic obstructive pulmonary disease with (acute) exacerbation: Secondary | ICD-10-CM | POA: Diagnosis not present

## 2020-08-05 DIAGNOSIS — Z20822 Contact with and (suspected) exposure to covid-19: Secondary | ICD-10-CM | POA: Insufficient documentation

## 2020-08-05 DIAGNOSIS — Z87891 Personal history of nicotine dependence: Secondary | ICD-10-CM | POA: Insufficient documentation

## 2020-08-05 DIAGNOSIS — Z7984 Long term (current) use of oral hypoglycemic drugs: Secondary | ICD-10-CM | POA: Insufficient documentation

## 2020-08-05 DIAGNOSIS — Z7982 Long term (current) use of aspirin: Secondary | ICD-10-CM | POA: Diagnosis not present

## 2020-08-05 DIAGNOSIS — R059 Cough, unspecified: Secondary | ICD-10-CM | POA: Diagnosis not present

## 2020-08-05 DIAGNOSIS — IMO0002 Reserved for concepts with insufficient information to code with codable children: Secondary | ICD-10-CM

## 2020-08-05 DIAGNOSIS — R0902 Hypoxemia: Secondary | ICD-10-CM

## 2020-08-05 DIAGNOSIS — I2510 Atherosclerotic heart disease of native coronary artery without angina pectoris: Secondary | ICD-10-CM | POA: Diagnosis not present

## 2020-08-05 DIAGNOSIS — Z79899 Other long term (current) drug therapy: Secondary | ICD-10-CM | POA: Insufficient documentation

## 2020-08-05 DIAGNOSIS — R0602 Shortness of breath: Secondary | ICD-10-CM | POA: Diagnosis not present

## 2020-08-05 DIAGNOSIS — R0600 Dyspnea, unspecified: Secondary | ICD-10-CM | POA: Diagnosis not present

## 2020-08-05 DIAGNOSIS — G4733 Obstructive sleep apnea (adult) (pediatric): Secondary | ICD-10-CM | POA: Diagnosis not present

## 2020-08-05 DIAGNOSIS — J984 Other disorders of lung: Secondary | ICD-10-CM

## 2020-08-05 DIAGNOSIS — I110 Hypertensive heart disease with heart failure: Secondary | ICD-10-CM | POA: Insufficient documentation

## 2020-08-05 DIAGNOSIS — E119 Type 2 diabetes mellitus without complications: Secondary | ICD-10-CM | POA: Insufficient documentation

## 2020-08-05 DIAGNOSIS — R0609 Other forms of dyspnea: Secondary | ICD-10-CM

## 2020-08-05 DIAGNOSIS — E118 Type 2 diabetes mellitus with unspecified complications: Secondary | ICD-10-CM

## 2020-08-05 DIAGNOSIS — R6889 Other general symptoms and signs: Secondary | ICD-10-CM | POA: Diagnosis not present

## 2020-08-05 DIAGNOSIS — Z743 Need for continuous supervision: Secondary | ICD-10-CM | POA: Diagnosis not present

## 2020-08-05 LAB — CBC WITH DIFFERENTIAL/PLATELET
Abs Immature Granulocytes: 0.03 10*3/uL (ref 0.00–0.07)
Basophils Absolute: 0 10*3/uL (ref 0.0–0.1)
Basophils Relative: 1 %
Eosinophils Absolute: 0.1 10*3/uL (ref 0.0–0.5)
Eosinophils Relative: 1 %
HCT: 47.3 % (ref 39.0–52.0)
Hemoglobin: 14.2 g/dL (ref 13.0–17.0)
Immature Granulocytes: 0 %
Lymphocytes Relative: 23 %
Lymphs Abs: 1.9 10*3/uL (ref 0.7–4.0)
MCH: 24.5 pg — ABNORMAL LOW (ref 26.0–34.0)
MCHC: 30 g/dL (ref 30.0–36.0)
MCV: 81.6 fL (ref 80.0–100.0)
Monocytes Absolute: 0.9 10*3/uL (ref 0.1–1.0)
Monocytes Relative: 11 %
Neutro Abs: 5.3 10*3/uL (ref 1.7–7.7)
Neutrophils Relative %: 64 %
Platelets: 242 10*3/uL (ref 150–400)
RBC: 5.8 MIL/uL (ref 4.22–5.81)
RDW: 18.1 % — ABNORMAL HIGH (ref 11.5–15.5)
WBC: 8.2 10*3/uL (ref 4.0–10.5)
nRBC: 0 % (ref 0.0–0.2)

## 2020-08-05 LAB — BASIC METABOLIC PANEL
Anion gap: 10 (ref 5–15)
BUN: 10 mg/dL (ref 8–23)
CO2: 32 mmol/L (ref 22–32)
Calcium: 9.6 mg/dL (ref 8.9–10.3)
Chloride: 98 mmol/L (ref 98–111)
Creatinine, Ser: 0.63 mg/dL (ref 0.61–1.24)
GFR, Estimated: >60 mL/min (ref >60)
Glucose, Bld: 181 mg/dL — ABNORMAL HIGH (ref 70–99)
Potassium: 4.7 mmol/L (ref 3.5–5.1)
Sodium: 140 mmol/L (ref 135–145)

## 2020-08-05 LAB — TROPONIN I (HIGH SENSITIVITY): Troponin I (High Sensitivity): 9 ng/L (ref <18)

## 2020-08-05 LAB — RESP PANEL BY RT-PCR (FLU A&B, COVID) ARPGX2
Influenza A by PCR: NEGATIVE
Influenza B by PCR: NEGATIVE
SARS Coronavirus 2 by RT PCR: NEGATIVE

## 2020-08-05 LAB — BRAIN NATRIURETIC PEPTIDE: B Natriuretic Peptide: 30.3 pg/mL (ref 0.0–100.0)

## 2020-08-05 MED ORDER — IPRATROPIUM-ALBUTEROL 0.5-2.5 (3) MG/3ML IN SOLN
3.0000 mL | Freq: Once | RESPIRATORY_TRACT | Status: AC
  Administered 2020-08-05: 3 mL via RESPIRATORY_TRACT
  Filled 2020-08-05: qty 3

## 2020-08-05 MED ORDER — IPRATROPIUM-ALBUTEROL 0.5-2.5 (3) MG/3ML IN SOLN
3.0000 mL | Freq: Once | RESPIRATORY_TRACT | Status: AC
Start: 2020-08-05 — End: 2020-08-05
  Administered 2020-08-05: 3 mL via RESPIRATORY_TRACT
  Filled 2020-08-05: qty 3

## 2020-08-05 MED ORDER — PREDNISONE 20 MG PO TABS: 60.0000 mg | ORAL_TABLET | Freq: Every day | ORAL | 0 refills | Status: AC

## 2020-08-05 MED ORDER — METHYLPREDNISOLONE SODIUM SUCC 125 MG IJ SOLR
125.0000 mg | Freq: Once | INTRAMUSCULAR | Status: AC
  Administered 2020-08-05: 125 mg via INTRAVENOUS
  Filled 2020-08-05: qty 2

## 2020-08-05 NOTE — Patient Instructions (Signed)
Given low oxygen I recommend ER evaluation today.  Go straight to the ER - we will call them to say you're on your way.

## 2020-08-05 NOTE — ED Triage Notes (Signed)
Pt brought in via EMS for Shortness of breath x 3 days,  sent from PCP's office. EMS reports rhonchi lower lobes, coughing milky sputum, 83% RA, 96% on 2L.  Pt has hx of COPD, CHF.

## 2020-08-05 NOTE — Discharge Instructions (Addendum)
Take the prednisone as prescribed and finish the full 5-day course.  Continue using albuterol by nebulizer or inhaler every 4-6 hours.  Follow-up with your regular doctor in 1 week.  Return to the ER for new, worsening, or persistent severe shortness of breath, chest pain, fever, weakness, or any other new or worsening symptoms that concern you.

## 2020-08-05 NOTE — ED Notes (Signed)
IV catheter removed intact without complication.  D/C instructions given, RX and follow up discussed.  All questions addressed.  Understanding verbalized.

## 2020-08-05 NOTE — Progress Notes (Signed)
Patient ID: [name], male    DOB: 25-Sep-1955, 65 y.o.   MRN: [id number]  This visit was conducted in person.  BP 136/82   Pulse 79   Temp (!) 97.5 F (36.4 C) (Temporal)   Ht _0  (1.803 m)   Wt 275 lb 2 oz (124.8 kg)   SpO2 96% Comment: 2L [place]  BMI 38.37 kg/m    CC: chest pain, congestion  Subjective:   HPI: [name] is a 65 y.o. male presenting on 08/05/2020 for Chest Pain (C/o chest pain, chest congestion and nausea.  Sxs starte 08/02/20.   Notified Dr. Darnell Level of low SpO2.  Per his instruction, started pt on O2, 2 L at 11:38.)   3d h/o productive cough of colored mucous, chest congestion and heaviness, nausea, progressive dyspnea with wheezing. Loss of appetite.   No fevers, chills, ear or tooth pain, ST, abd pain, diarrhea. No loss of taste or smell.  No sick contacts at home.  No known COVID exposure.   Treating with albuterol nebulizer twice daily and nyquil DM.  Known COPD with chronic dyspnea, improved since starting incruse ellipta.  Known central sleep apnea unable to tolerate CPAP machine.   S/p 3 COVID vaccines all Pfizer 06/2019, 07/2019, 02/2020     Relevant past medical, surgical, family and social history reviewed and updated as indicated. Interim medical history since our last visit reviewed. Allergies and medications reviewed and updated. Outpatient Medications Prior to Visit  Medication Sig Dispense Refill  . ACCU-CHEK FASTCLIX LANCETS MISC Check blood sugar once daily and as instructed. Dx 250.00 100 each 3  . albuterol (PROVENTIL) (2.5 MG/3ML) 0.083% nebulizer solution Take 3 mLs (2.5 mg total) by nebulization every 6 (six) hours as needed for wheezing or shortness of breath. 150 mL 1  . albuterol (VENTOLIN HFA) 108 (90 Base) MCG/ACT inhaler INHALE 2 PUFFS BY MOUTH EVERY 6 HOURS AS NEEDED FOR WHEEZE OR SHORTNESS OF BREATH 18 g 1  . amoxicillin-clavulanate (AUGMENTIN) 875-125 MG tablet Take 1 tablet by mouth 2 (two) times daily for 21 days.  42 tablet 0  . aspirin EC 81 MG tablet Take 81 mg by mouth daily.    . Blood Glucose Monitoring Suppl (BLOOD GLUCOSE MONITOR SYSTEM) W/DEVICE KIT by Does not apply route. 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 carvedilol (COREG) 6.25 MG tablet TAKE 1 TABLET BY MOUTH 2 TIMES DAILY WITH A MEAL. 180 tablet 0  . Cholecalciferol (VITAMIN D) 50 MCG (2000 UT) CAPS Take 1 capsule (2,000 Units total) by mouth daily. 30 capsule   . citalopram (CELEXA) 10 MG tablet Take 1 tablet (10 mg total) by mouth daily. 90 tablet 3  . Cyanocobalamin (B-12) 1000 MCG SUBL Place 1 tablet under the tongue daily.    . cyclobenzaprine (FLEXERIL) 10 MG tablet Take 0.5-1 tablets (5-10 mg total) by mouth 2 (two) times daily as needed for muscle spasms (sedation precautions). 30 tablet 0  . ferrous sulfate 325 (65 FE) MG tablet Take 1 tablet (325 mg total) by mouth daily with breakfast.    . fluticasone (FLONASE) 50 MCG/ACT nasal spray SPRAY 2 SPRAYS INTO EACH NOSTRIL EVERY DAY 16 mL 3  . furosemide (LASIX) 40 MG tablet TAKE 1 TABLET BY MOUTH TWICE A DAY 180 tablet 1  . glimepiride (AMARYL) 4 MG tablet Take 1 tablet (4 mg total) by mouth daily with breakfast. 90 tablet 3  .  hydrOXYzine (ATARAX/VISTARIL) 50 MG tablet TAKE 1 TABLET (50 MG TOTAL) BY MOUTH 2 (TWO) TIMES DAILY AS NEEDED FOR ANXIETY. 180 tablet 1  . isosorbide mononitrate (IMDUR) 30 MG 24 hr tablet Take by mouth.    . metFORMIN (GLUCOPHAGE) 1000 MG tablet TAKE 1 TABLET (1,000 MG TOTAL) BY MOUTH 2 (TWO) TIMES DAILY WITH A MEAL. 180 tablet 0  . montelukast (SINGULAIR) 10 MG tablet TAKE 1 TABLET BY MOUTH EVERYDAY AT BEDTIME 90 tablet 3  . ONETOUCH VERIO test strip USE AS INSTRUCTED TO CHECK THREE TIMES DAILY AND AS NEEDED. E11.8 300 strip 3  . pantoprazole (PROTONIX) 40 MG tablet Take 1 tablet (40 mg total) by mouth daily. 90 tablet 3  . Probiotic Product (PROBIOTIC DAILY PO) Take by mouth.    . simvastatin (ZOCOR) 20 MG tablet TAKE  1 TABLET BY MOUTH EVERY DAY IN THE EVENING 90 tablet 2  . umeclidinium bromide (INCRUSE ELLIPTA) 62.5 MCG/INH AEPB Inhale 1 puff into the lungs daily. 30 each 11  . dapagliflozin propanediol (FARXIGA) 10 MG TABS tablet Take 1 tablet (10 mg total) by mouth daily before breakfast. (Patient not taking: Reported on 08/05/2020) 30 tablet 11   No facility-administered medications prior to visit.     Per HPI unless specifically indicated in ROS section below Review of Systems Objective:  BP 136/82   Pulse 79   Temp (!) 97.5 F (36.4 C) (Temporal)   Ht _0  (1.803 m)   Wt 275 lb 2 oz (124.8 kg)   SpO2 96% Comment: 2L [place]  BMI 38.37 kg/m   Wt Readings from Last 3 Encounters:  08/05/20 275 lb 2 oz (124.8 kg)  07/16/20 284 lb (128.8 kg)  06/24/20 295 lb 4 oz (133.9 kg)      Physical Exam Vitals and nursing note reviewed.  Constitutional:      Appearance: He is well-developed. He is obese. He is diaphoretic.  Cardiovascular:     Rate and Rhythm: Normal rate and regular rhythm.     Pulses: Normal pulses.     Heart sounds: Normal heart sounds. No murmur heard.   Pulmonary:     Effort: No respiratory distress.     Breath sounds: Rhonchi present. No wheezing or rales.     Comments: Coarse breath sounds with mild wheeze throughout Musculoskeletal:     Right lower leg: No edema.     Left lower leg: No edema.  Neurological:     Mental Status: He is alert.  Psychiatric:        Mood and Affect: Mood normal.        Behavior: Behavior normal.        Assessment & Plan:  This visit occurred during the SARS-CoV-2 public health emergency.  Safety protocols were in place, including screening questions prior to the visit, additional usage of staff PPE, and extensive cleaning of exam room while observing appropriate contact time as indicated for disinfecting solutions.   Problem List Items Addressed This Visit    OSA (obstructive sleep apnea)   Severe obesity (BMI 35.0-39.9) with  comorbidity ([place])   Diabetes mellitus type 2, uncontrolled, with complications (HCC)   Exertional dyspnea   Chronic systolic CHF (congestive heart failure) (HCC)   Restrictive lung disease   COPD (chronic obstructive pulmonary disease) (HCC)   Cough - Primary    3 days of productive cough, dyspnea, chest tightness. Presenting with hypoxia in office today that does improve with 2L [place]. Recommend ER evaluation tody -  will need COVID ruled out as well as further evaluation s/p pneumonia vs COPD exacerbation. Pt agrees with plan - agrees to go via EMS      RESOLVED: Hypoxia       No orders of the defined types were placed in this encounter.  No orders of the defined types were placed in this encounter.   Patient Instructions  Given low oxygen I recommend ER evaluation today.  Go straight to the ER - we will call them to say you're on your way.    Follow up plan: Return if symptoms worsen or fail to improve.  [name], MD

## 2020-08-05 NOTE — Assessment & Plan Note (Signed)
3 days of productive cough, dyspnea, chest tightness. Presenting with hypoxia in office today that does improve with 2L [place]. Recommend ER evaluation tody - will need COVID ruled out as well as further evaluation s/p pneumonia vs COPD exacerbation. Pt agrees with plan - agrees to go via EMS

## 2020-08-05 NOTE — ED Provider Notes (Signed)
  Patient received in signout from Dr. Cherylann Banas pending second breathing treatment for treatment of COPD exacerbation.  I reevaluate the patient he reports feeling much better and is requesting discharge.  Maintaining O2 sats in the low-mid 90s.  Good air movement on pulmonary auscultation with some scattered expiratory wheezes.  He reports feeling comfortable going home, and he looks well, and I see no barriers to outpatient management.  Patient reports having nebulizer at home with ample albuterol solution.  Prescribed prednisone by Dr. Cherylann Banas and I discussed return precautions with the patient prior to discharge.   [name] Crofts, MD 08/05/20 1730

## 2020-08-05 NOTE — ED Provider Notes (Signed)
[facility] Emergency Department Provider Note ____________________________________________   Event Date/Time   First MD Initiated Contact with Patient 08/05/20 1301     (approximate)  I have reviewed the triage vital signs and the nursing notes.   HISTORY  Chief Complaint Shortness of Breath    HPI [name] is a 65 y.o. male with PMH as noted below including CAD, CHF, COPD, and diabetes who presents with worsening shortness of breath from baseline, acute onset about 3 days ago, associated with a cough productive of white sputum.  The patient has some associated chest pain but denies fever, vomiting, or abdominal pain.  He has no leg swelling.  He states he used his albuterol nebulizer at home with minimal relief.  Past Medical History:  Diagnosis Date  . Abnormal drug screen 2015   MJ positive x3 - one more and we will stop prescribing ativan (08/2013).  . Angina   . Anxiety   . Arthritis   . CAD (coronary artery disease)    nonobstructive  . Cannabis abuse   . Chronic systolic CHF (congestive heart failure) ([facility]) 2013   NYHA Class II/III  . COPD (chronic obstructive pulmonary disease) ([place])   . Diabetes type 2, uncontrolled ([place])    declines DSME  . Dilated cardiomyopathy ([place]) 2013   now improved  . Ex-smoker   . Frequent headaches   . History of atrial fibrillation 2013   chronic, s/p ablation prior on coumadin  . Hyperlipidemia   . Migraine   . Nonischemic cardiomyopathy ([place]) 2013   EF 25% per Dr Nehemiah Massed  . Obesity   . OSA (obstructive sleep apnea)    does not use CPAP - unable to tolerate  . Seasonal allergies     Patient Active Problem List   Diagnosis Date Noted  . Cough 08/05/2020  . Vitreous floaters of left eye 06/05/2020  . PLMD (periodic limb movement disorder) 06/05/2020  . Ulceration of intestine   . Neck pain on left side 02/25/2020  . Mediastinal mass 01/05/2020  . Skin rash 10/12/2019  . Vitamin D  deficiency 04/19/2019  . Low serum vitamin B12 04/19/2019  . Iron deficiency anemia 01/02/2019  . Personal history of tobacco use, presenting hazards to health 11/07/2018  . Epigastric abdominal pain 09/19/2018  . COPD (chronic obstructive pulmonary disease) ([place]) 07/25/2018  . Chronic neck pain [facility] Area of Pain) (R>L) 01/05/2018  . Chronic upper extremity pain ([place] Area of Pain) (R>L) 01/05/2018  . Chronic upper back pain 01/05/2018  . Chronic bilateral low back pain without sciatica 01/05/2018  . Chronic pain syndrome 01/05/2018  . Right temporal headache 10/17/2017  . Right leg weakness 07/14/2017  . AAA (abdominal aortic aneurysm) without rupture ([place]) 09/15/2016  . Adrenal adenoma, left 08/13/2016  . Nodule of right lung 08/13/2016  . Fatty liver 08/01/2016  . Right flank pain 07/26/2016  . 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 Right shoulder pain 07/26/2016  . Restrictive lung disease 06/04/2016  . Chronic systolic CHF (congestive heart failure) ([place])   . Tinea pedis 06/30/2015  . Gassiness 02/21/2015  . Advanced care planning/counseling discussion 10/16/2014  . Encounter for general adult medical examination with abnormal findings 10/16/2014  . Diabetes mellitus type 2, uncontrolled, with complications ([place]) 47/65/4650  . Medicare annual wellness visit, subsequent 09/03/2013  . Umbilical hernia without obstruction and without gangrene 09/03/2013  . Left flank mass 09/03/2013  . Hyperlipidemia associated with type 2 diabetes mellitus ([place]) 06/29/2013  .  Coronary artery disease 06/29/2013  . Abnormal toxicological findings 06/24/2013  . Exertional dyspnea 06/14/2013  . Malaise and fatigue 03/14/2013  . Hypertension   . Panic attacks   . History of atrial flutter 07/07/2011  . Severe obesity (BMI 35.0-39.9) with comorbidity ([place]) 07/07/2011  . Ex-smoker 08/13/2009  . OSA (obstructive sleep apnea) 08/13/2009  . DDD (degenerative disc disease), cervical  08/13/2009  . Allergic rhinitis 06/24/2009    Past Surgical History:  Procedure Laterality Date  . ATRIAL FLUTTER ABLATION N/A 09/21/2011   Procedure: ATRIAL FLUTTER ABLATION;  Surgeon: Thompson Grayer, MD;  Location: [facility] CATH LAB;  Service: Cardiovascular;  Laterality: N/A;  . CARDIAC ELECTROPHYSIOLOGY [place] AND ABLATION  2013   for atrial flutter  . CARDIOVASCULAR STRESS TEST  03/2012   ETT WNL Nehemiah Massed)  . COLONOSCOPY WITH PROPOFOL N/A 05/22/2020   TA, diverticulosis, descending colon ulcer biopsy WNL, int hem Allen Norris, Darren, MD)  . ESOPHAGOGASTRODUODENOSCOPY (EGD) WITH PROPOFOL N/A 05/22/2020   WNL ([place])  . US ECHOCARDIOGRAPHY  2014   EF 50%, nl LV fxn, RV nl size/function, mild mitral insuff    Prior to Admission medications   Medication Sig Start Date End Date Taking? Authorizing Provider  predniSONE (DELTASONE) 20 MG tablet Take 3 tablets (60 mg total) by mouth daily with breakfast for 5 days. Start the day after the ER visit 08/06/20 08/11/20 Yes Arta Silence, MD  ACCU-CHEK FASTCLIX LANCETS MISC Check blood sugar once daily and as instructed. Dx 250.00 05/10/13   [name], MD  albuterol (PROVENTIL) (2.5 MG/3ML) 0.083% nebulizer solution Take 3 mLs (2.5 mg total) by nebulization every 6 (six) hours as needed for wheezing or shortness of breath. 10/02/19   [name], MD  albuterol (VENTOLIN HFA) 108 (90 Base) MCG/ACT inhaler INHALE 2 PUFFS BY MOUTH EVERY 6 HOURS AS NEEDED FOR WHEEZE OR SHORTNESS OF BREATH 12/10/19   [name], MD  amoxicillin-clavulanate (AUGMENTIN) 875-125 MG tablet Take 1 tablet by mouth 2 (two) times daily for 21 days. 07/16/20 08/06/20  [name], MD  aspirin EC 81 MG tablet Take 81 mg by mouth daily.    [provider]  Blood Glucose Monitoring Suppl (BLOOD GLUCOSE MONITOR SYSTEM) W/DEVICE KIT by Does not apply route. Use to check sugar once daily and as needed Dx: E11.9 **ONE TOUCH VERIO**    [provider]   carvedilol (COREG) 6.25 MG tablet TAKE 1 TABLET BY MOUTH 2 TIMES DAILY WITH A MEAL. 06/02/20   [name], MD  Cholecalciferol (VITAMIN D) 50 MCG (2000 UT) CAPS Take 1 capsule (2,000 Units total) by mouth daily. 02/05/19   [name], MD  citalopram (CELEXA) 10 MG tablet Take 1 tablet (10 mg total) by mouth daily. 07/16/20   [name], MD  Cyanocobalamin (B-12) 1000 MCG SUBL Place 1 tablet under the tongue daily. 02/05/19   [name], MD  cyclobenzaprine (FLEXERIL) 10 MG tablet Take 0.5-1 tablets (5-10 mg total) by mouth 2 (two) times daily as needed for muscle spasms (sedation precautions). 04/14/20   [name], MD  dapagliflozin propanediol (FARXIGA) 10 MG TABS tablet Take 1 tablet (10 mg total) by mouth daily before breakfast. Patient not taking: Reported on 08/05/2020 07/16/20   [name], MD  ferrous sulfate 325 (65 FE) MG tablet Take 1 tablet (325 mg total) by mouth daily with breakfast. 04/14/20   [name], MD  fluticasone [facility]) 50 MCG/ACT nasal spray SPRAY 2 SPRAYS INTO EACH NOSTRIL EVERY DAY 07/07/20   [name], MD  furosemide (LASIX) 40 MG tablet TAKE 1 TABLET BY MOUTH TWICE A DAY 05/21/20   [name], MD  glimepiride (AMARYL) 4 MG tablet Take 1 tablet (4 mg total) by mouth daily with breakfast. 10/02/19   [name], MD  hydrOXYzine (ATARAX/VISTARIL) 50 MG tablet TAKE 1 TABLET (50 MG TOTAL) BY MOUTH 2 (TWO) TIMES DAILY AS NEEDED FOR ANXIETY. 04/29/20   [name], MD  isosorbide mononitrate (IMDUR) 30 MG 24 hr tablet Take by mouth. 03/27/20 03/27/21  [provider]  metFORMIN (GLUCOPHAGE) 1000 MG tablet TAKE 1 TABLET (1,000 MG TOTAL) BY MOUTH 2 (TWO) TIMES DAILY WITH A MEAL. 04/17/20   [name], MD  montelukast (SINGULAIR) 10 MG tablet TAKE 1 TABLET BY MOUTH EVERYDAY AT BEDTIME 07/28/20   [name], MD  [facility] VERIO test strip USE AS INSTRUCTED TO CHECK THREE TIMES DAILY AND AS NEEDED.  E11.8 12/10/19   [name], MD  pantoprazole (PROTONIX) 40 MG tablet Take 1 tablet (40 mg total) by mouth daily. 06/04/20   [name], MD  Probiotic Product (PROBIOTIC DAILY PO) Take by mouth.    [provider]  simvastatin (ZOCOR) 20 MG tablet TAKE 1 TABLET BY MOUTH EVERY DAY IN THE EVENING 04/28/20   [name], MD  umeclidinium bromide (INCRUSE ELLIPTA) 62.5 MCG/INH AEPB Inhale 1 puff into the lungs daily. 07/16/20   [name], MD    Allergies Atorvastatin and Lisinopril  Family History  Problem Relation Age of Onset  . Heart failure Father 78  . Cancer Mother 29       NHL  . Hypertension Maternal Grandfather   . CAD Maternal Grandfather 41       MI  . Diabetes Other        grandparents  . Cancer Brother 36       lung (smoker)    Social History Social History   Tobacco Use  . Smoking status: Former Smoker    Packs/day: 1.00    Years: 31.00    Pack years: 31.00    Types: Cigarettes    Quit date: 04/26/2018    Years since quitting: 2.2  . Smokeless tobacco: Never Used  Vaping Use  . Vaping Use: Never used  Substance Use Topics  . Alcohol use: No    Alcohol/week: 0.0 standard drinks  . Drug use: Yes    Types: Marijuana    Comment: 05/18/20    Review of Systems  Constitutional: No fever. Eyes: No redness. ENT: No sore throat. Cardiovascular: Positive for chest pain. Respiratory: Positive for shortness of breath. Gastrointestinal: No vomiting or diarrhea.  Genitourinary: Negative for flank pain. Musculoskeletal: Negative for back pain. Skin: Negative for rash. Neurological: Negative for headache.   ____________________________________________   PHYSICAL EXAM:  VITAL SIGNS: ED Triage Vitals  Enc Vitals Group     BP 08/05/20 1302 (!) 150/72     Pulse Rate 08/05/20 1302 98     Resp 08/05/20 1302 18     Temp --      Temp Source 08/05/20 1302 Oral     SpO2 08/05/20 1302 93 %     Weight 08/05/20 1304 276 lb (125.2 kg)      Height 08/05/20 1304 6' (1.829 m)     Head Circumference --      Peak Flow --      Pain Score 08/05/20 1304 0     Pain Loc --      Pain Edu? --  Excl. in [place]? --     Constitutional: Alert and oriented.  Relatively well appearing and in no acute distress. Eyes: Conjunctivae are normal.  Head: Atraumatic. Nose: No congestion/rhinnorhea. Mouth/Throat: Mucous membranes are moist.   Neck: Normal range of motion.  Cardiovascular: Normal rate, regular rhythm. Grossly normal heart sounds.  Good peripheral circulation. Respiratory: Normal respiratory effort.  No retractions.  Diffuse rhonchi and faint wheezing bilaterally Gastrointestinal: No distention.  Musculoskeletal: No lower extremity edema.  Extremities warm and well perfused.  Neurologic:  Normal speech and language. No gross focal neurologic deficits are appreciated.  Skin:  Skin is warm and dry. No rash noted. Psychiatric: Mood and affect are normal. Speech and behavior are normal.  ____________________________________________   LABS (all labs ordered are listed, but only abnormal results are displayed)  Labs Reviewed  BASIC METABOLIC PANEL - Abnormal; Notable for the following components:      Result Value   Glucose, Bld 181 (*)    All other components within normal limits  CBC WITH DIFFERENTIAL/PLATELET - Abnormal; Notable for the following components:   MCH 24.5 (*)    RDW 18.1 (*)    All other components within normal limits  RESP PANEL BY RT-PCR (FLU A&B, COVID) ARPGX2  BRAIN NATRIURETIC PEPTIDE  TROPONIN I (HIGH SENSITIVITY)   ____________________________________________  EKG  ED ECG REPORT I, Arta Silence, the attending physician, personally viewed and interpreted this ECG.  Date: 08/05/2020 EKG Time: 1308 Rate: 82 Rhythm: normal sinus rhythm QRS Axis: normal Intervals: normal ST/T Wave abnormalities: normal Narrative Interpretation: no evidence of acute  ischemia  ____________________________________________  RADIOLOGY  Chest x-ray interpreted by me shows no focal infiltrate or edema  ____________________________________________   PROCEDURES  Procedure(s) performed: No  Procedures  Critical Care performed: No ____________________________________________   INITIAL IMPRESSION / ASSESSMENT AND PLAN / ED COURSE  Pertinent labs & imaging results that were available during my care of the patient were reviewed by me and considered in my medical decision making (see chart for details).  65 year old male with PMH as noted above including COPD, CHF, CAD, and diabetes presents with worsening shortness of breath and productive cough over the last 3 days not associated with fever.  I reviewed the past medical records in [place].  The patient was last seen in the ED with chest pain and shortness of breath in October 2021.  He was ruled out for ACS and discharged home.  On exam today the patient is overall relatively well-appearing.  O2 saturation is in the mid 90s on 2 L of O2 by nasal cannula although the patient is not normally on O2 at baseline.  He has rhonchi and some wheezing on lung exam bilaterally.  Physical exam is otherwise unremarkable.  He has no peripheral edema.  Overall presentation favors most likely COPD exacerbation.  Differential includes acute bronchitis, pneumonia, COVID-19 or other viral etiology, or less likely acute CHF.  We will obtain a chest x-ray, lab work-up, give duo nebs and steroid, and reassess.  ----------------------------------------- 3:02 PM on 08/05/2020 -----------------------------------------  On reassessment, the patient states he feels much better.  I took him off of the O2 and his saturation stayed around 90 to 93%.  We will give an additional breathing treatment and reassess.  The patient states that he would like to go home if at all possible.  Plan will be to reassess around 4 PM and if the patient  can maintain an O2 saturation in the 90s and continues to feel  well, he will be appropriate for discharge home with a steroid prescription.  I signed the patient out to the oncoming ED physician Dr. Tamala Julian.  ____________________________________________   FINAL CLINICAL IMPRESSION(S) / ED DIAGNOSES  Final diagnoses:  COPD exacerbation ([place])      NEW MEDICATIONS STARTED DURING THIS VISIT:  New Prescriptions   PREDNISONE (DELTASONE) 20 MG TABLET    Take 3 tablets (60 mg total) by mouth daily with breakfast for 5 days. Start the day after the ER visit     Note:  This document was prepared using Dragon voice recognition software and may include unintentional dictation errors.    Arta Silence, MD 08/05/20 [phone]

## 2020-08-07 ENCOUNTER — Telehealth: Payer: Self-pay

## 2020-08-07 DIAGNOSIS — J449 Chronic obstructive pulmonary disease, unspecified: Secondary | ICD-10-CM

## 2020-08-07 DIAGNOSIS — R0902 Hypoxemia: Secondary | ICD-10-CM

## 2020-08-07 NOTE — Telephone Encounter (Signed)
See PCP note.  I'll defer.

## 2020-08-07 NOTE — Telephone Encounter (Signed)
Sent message to [facility].  Got response that they received the order.

## 2020-08-07 NOTE — Telephone Encounter (Signed)
Pt seen in office this week with new hypoxia so sent to ER where he was treated for COPD exacerbation. If maintaining low oxygen levels, needs supplemental oxygen at home.  I've ordered home oxygen for patient, will see if it can get done today or tomorrow prior to weekend.  Agree with ER precautions.

## 2020-08-07 NOTE — Telephone Encounter (Signed)
Pt said he was seen at [facility] on 08/05/20 and sent by ambulance to [facility] ED from [facility]; pt said they gave him a neb treatment(pt said he has a nebulizer at home) and oxygen. Pt wants oxygen in his home or referral to pulmonologist. Pt has already made a FU ED appt on 08/13/20 with Dr Darnell Level but I advised pt that he should not wait that long to see a provider and the process for getting Oxygen in the home is not a simple phone call. Pt said he understood and could someone see him at [facility]; Pt took his pulse ox and now was 91% that immediately dropped to 79 %. Pt said early this morning was 77%. Pt said he is SOB and having some chest discomfort but pt said he has been having chest discomfort for 6 months. I advised pt that the pulse ox was too low and pt said he knew that and wanted oxygen. I spoke with Dr Damita Dunnings. Pt has taken 2 days of prednisone 20 mg taking 3 tabs at breakfast. Dr Damita Dunnings said his best advise was for pt to go to ED for eval, testing and treatment. Pt was notified of Dr Josefine Class instruction and pt voiced understanding but declined. Pt said he does not feel bad and wants to know the worst that could happen to him; I advised hopefully nothing bad would happen but I would say the worst thing would be to stop breathing and die. Pt voiced understanding and said he is not going to ED now but if his condition worsens or does not improve he will consider going to ED; pt said he did hear what I was saying. Sending note to Dr Damita Dunnings who is in office and Dr Darnell Level who is out of office and Lattie Haw CMA.

## 2020-08-13 ENCOUNTER — Ambulatory Visit (INDEPENDENT_AMBULATORY_CARE_PROVIDER_SITE_OTHER): Payer: Medicare Other | Admitting: Family Medicine

## 2020-08-13 ENCOUNTER — Telehealth: Payer: Self-pay

## 2020-08-13 ENCOUNTER — Other Ambulatory Visit: Payer: Self-pay

## 2020-08-13 ENCOUNTER — Encounter: Payer: Self-pay | Admitting: Family Medicine

## 2020-08-13 VITALS — BP 136/74 | HR 88 | Temp 97.9°F | Ht 72.0 in | Wt 277.3 lb

## 2020-08-13 DIAGNOSIS — J449 Chronic obstructive pulmonary disease, unspecified: Secondary | ICD-10-CM

## 2020-08-13 DIAGNOSIS — J302 Other seasonal allergic rhinitis: Secondary | ICD-10-CM

## 2020-08-13 DIAGNOSIS — J441 Chronic obstructive pulmonary disease with (acute) exacerbation: Secondary | ICD-10-CM | POA: Diagnosis not present

## 2020-08-13 DIAGNOSIS — G4733 Obstructive sleep apnea (adult) (pediatric): Secondary | ICD-10-CM | POA: Diagnosis not present

## 2020-08-13 DIAGNOSIS — R519 Headache, unspecified: Secondary | ICD-10-CM | POA: Diagnosis not present

## 2020-08-13 IMAGING — CT CT ABDOMEN AND PELVIS WITH CONTRAST
2 of 5 series · 16 of 46 positions shown, 18 images · IV contrast (omnipaque)
Comparison: 08/11/2016 CT

CLINICAL DATA: 63-year-old male with acute abdominal and pelvic
pain and nausea for 2 days.

EXAM:
CT ABDOMEN AND PELVIS WITH CONTRAST
TECHNIQUE: Multidetector CT imaging of the abdomen and pelvis was performed
using the standard protocol following bolus administration of
intravenous contrast.
CONTRAST:  100mL OMNIPAQUE IOHEXOL 300 MG/ML  SOLN

[Series 3: routine abd/pel with · axial · 0.96mm/px · z∈[-891,-426]mm · 13 of 105 slices shown, 15 images]
[im 6/105  soft-tissue]
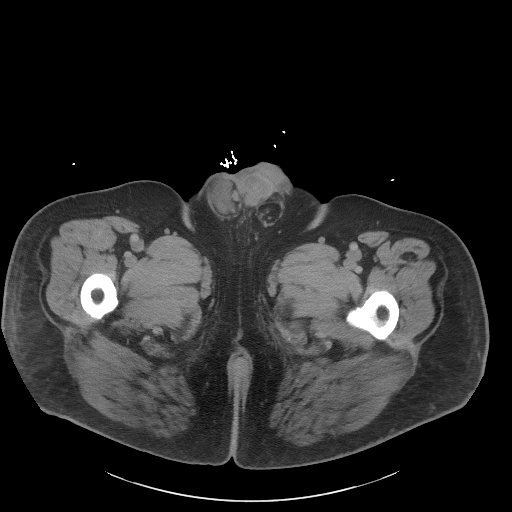
[im 6/105  bone]
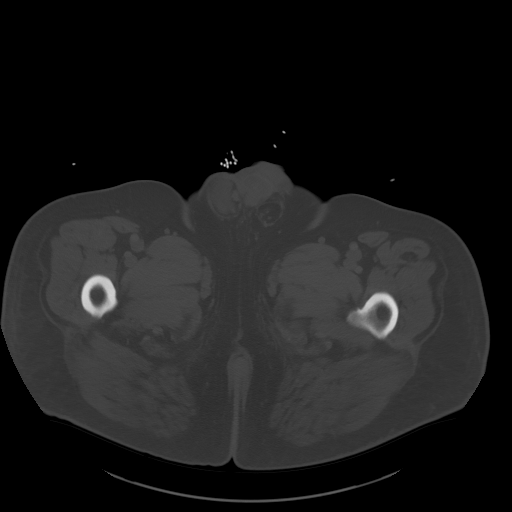
[im 12/105  soft-tissue]
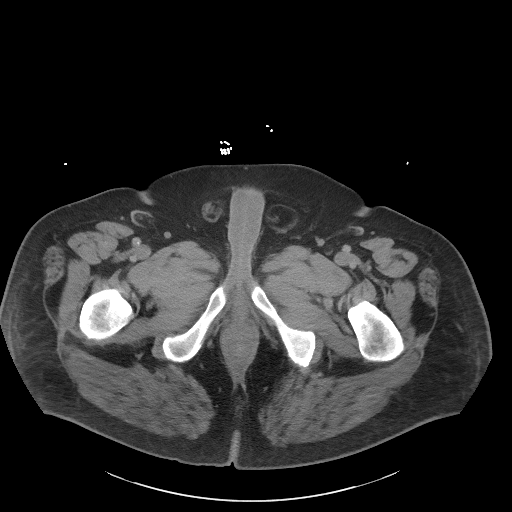
[im 24/105  soft-tissue]
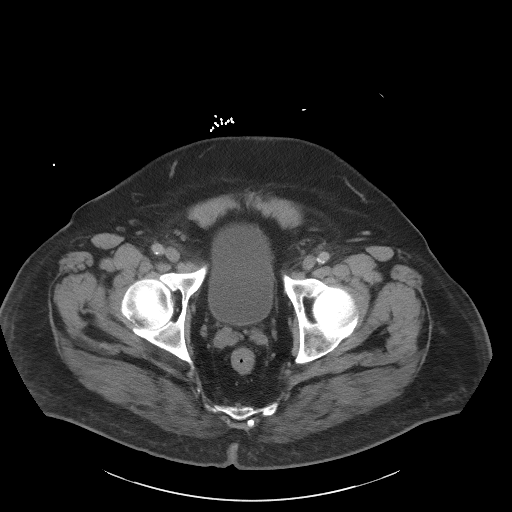
[im 29/105  soft-tissue]
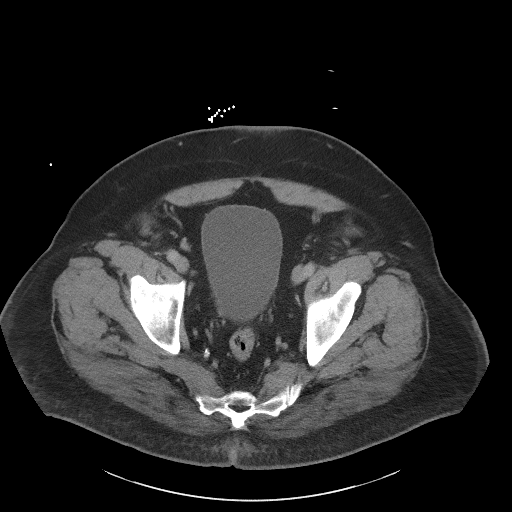
[im 35/105  soft-tissue]
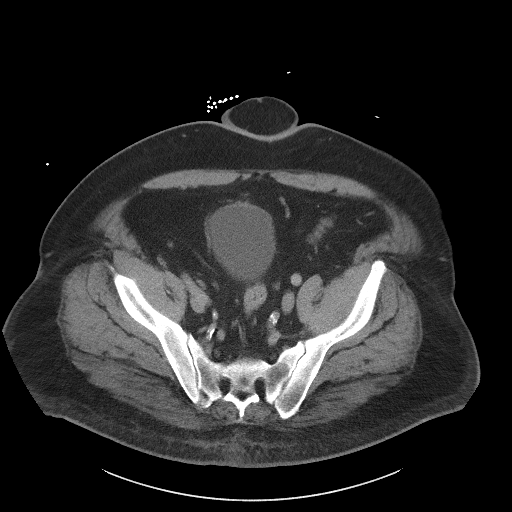
[im 47/105  soft-tissue]
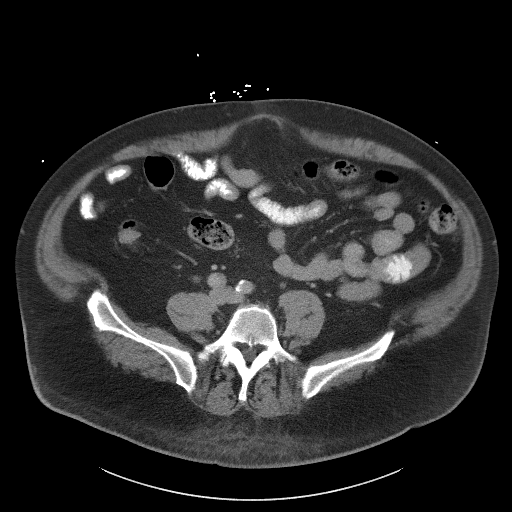
[im 53/105  soft-tissue]
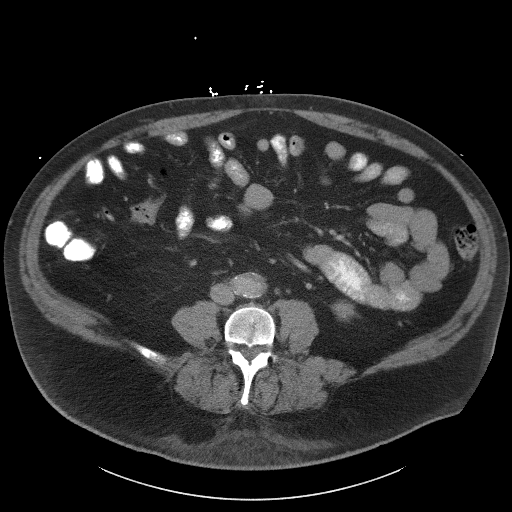
[im 58/105  soft-tissue]
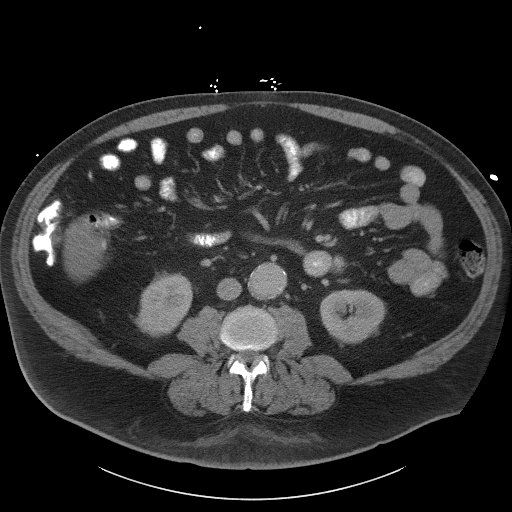
[im 70/105  soft-tissue]
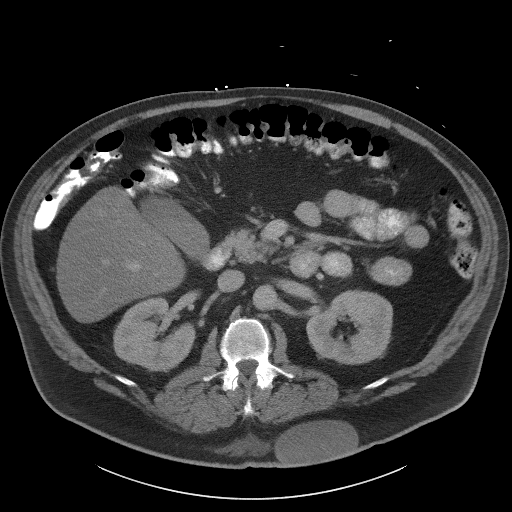
[im 70/105  bone]
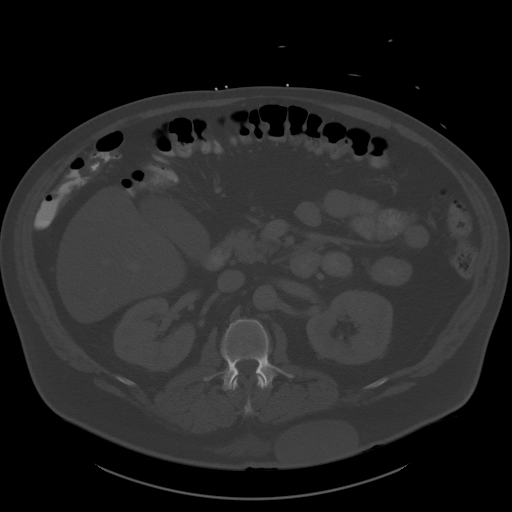
[im 76/105  soft-tissue]
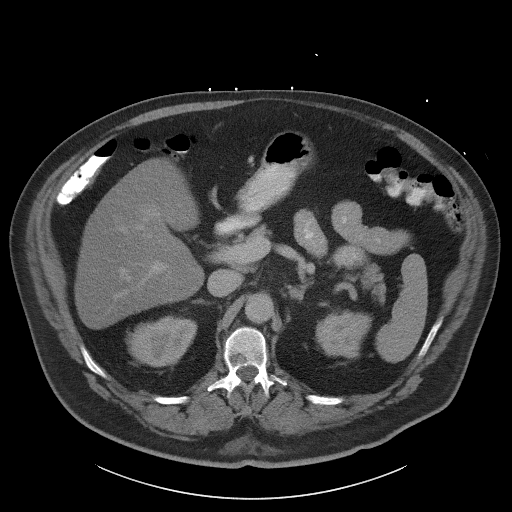
[im 81/105  soft-tissue]
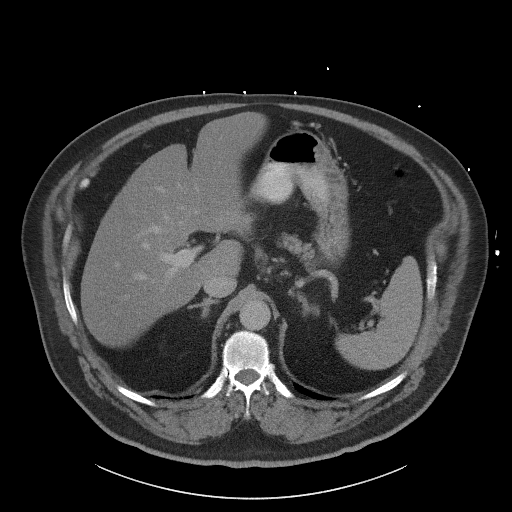
[im 93/105  soft-tissue]
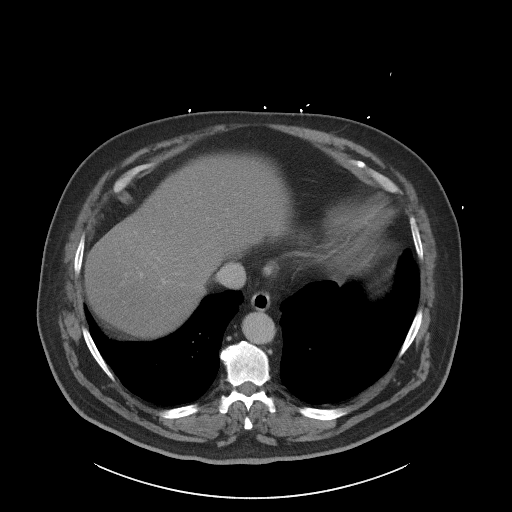
[im 99/105  soft-tissue]
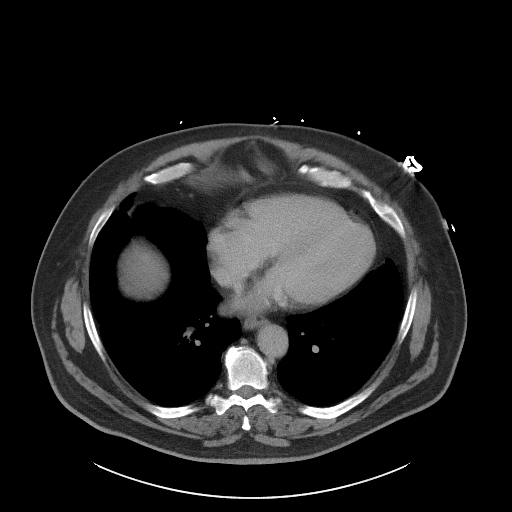

[Series 6: coronal st · coronal · 1.00mm/px · 3 of 125 slices shown]
[im 42/125  soft-tissue]
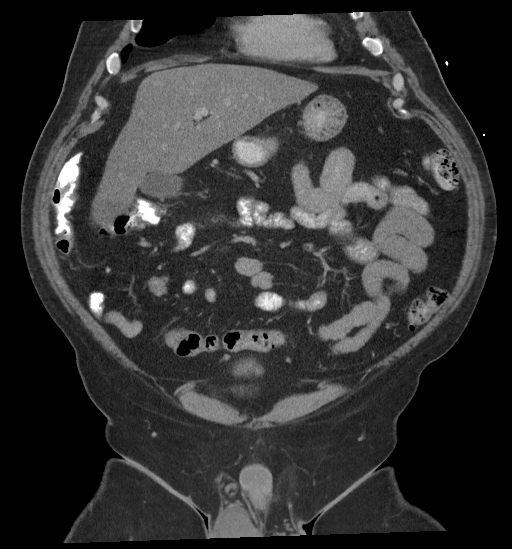
[im 56/125  soft-tissue]
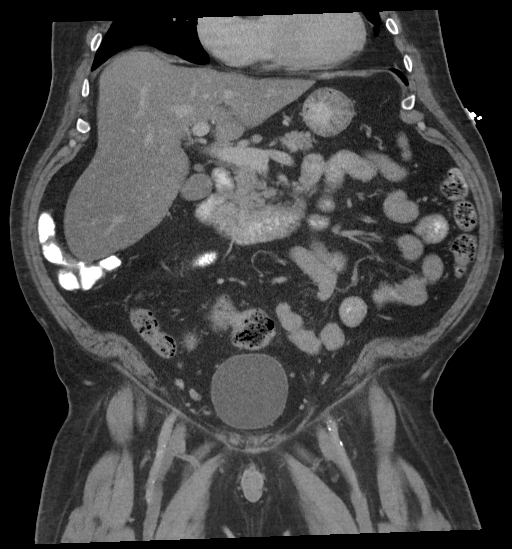
[im 69/125  soft-tissue]
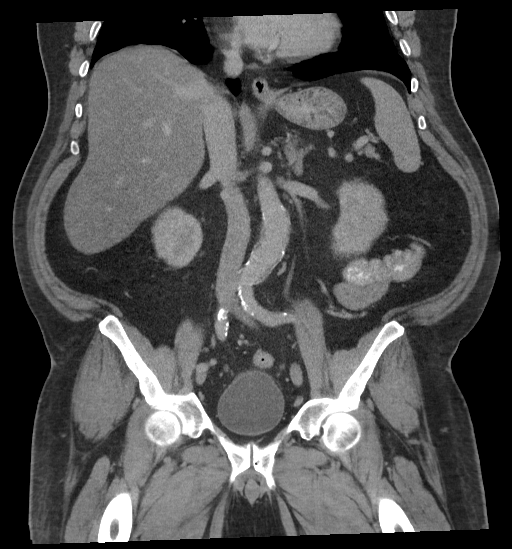

[16 of 46 positions shown; findings below may reference images not displayed]

FINDINGS: Lower chest: A 4 mm RIGHT LOWER lobe nodule is unchanged from 6761
and considered benign.

Hepatobiliary: Mild hepatic steatosis identified without focal
hepatic abnormality. The gallbladder is unremarkable. No biliary
dilatation.

Pancreas: Unremarkable

Spleen: Unremarkable

Adrenals/Urinary Tract: The kidneys, adrenal glands and bladder are
unremarkable except for a stable 1 cm LEFT adrenal adenoma.

Stomach/Bowel: Stomach is within normal limits. Appendix appears
normal. No evidence of bowel wall thickening, distention, or
inflammatory changes.

Vascular/Lymphatic: A 3.3 cm infrarenal suprailiac abdominal aortic
aneurysm is unchanged in size. Aortic atherosclerotic calcifications
again noted. No enlarged lymph nodes are identified.

Reproductive: Prostate is unremarkable.

Other: No ascites, pneumoperitoneum or focal collection. A moderate
to large umbilical hernia containing fat is again identified.

Musculoskeletal: No acute or suspicious bony abnormalities. An 8 cm
low-density subcutaneous oval structure along the LEFT back is
unchanged and likely represents a large sebaceous cyst.
IMPRESSION: 1. No acute abnormality. No CT findings to suggest a cause for this
patient's abdominopelvic pain.
2. Unchanged 3.3 cm abdominal aortic aneurysm. Recommend followup by
ultrasound in 3 years. This recommendation follows ACR consensus
guidelines: White Paper of the ACR Incidental Findings Committee II
aneurysm NOS (654UR-OLH.B)
3. Mild hepatic steatosis.

## 2020-08-13 NOTE — Assessment & Plan Note (Signed)
Continue incruse ellpita daily with PRN albuterol.

## 2020-08-13 NOTE — Progress Notes (Signed)
Patient ID: LONDEN BOK, male    DOB: 07-Apr-1956, 65 y.o.   MRN: [id number]  This visit was conducted in person.  BP 136/74   Pulse 88   Temp 97.9 F (36.6 C) (Temporal)   Ht 6' (1.829 m)   Wt 277 lb 5 oz (125.8 kg)   SpO2 94%   BMI 37.61 kg/m    CC: ER f/u visit  Subjective:   HPI: [name] is a 65 y.o. male presenting on 08/13/2020 for Hospitalization Follow-up (Seen on 08/05/20 at [facility] ED, dx COPD exacerbation. )   See prior 2 notes for details.  Sent to ER last week from office after presenting to office with hypoxia, new onset cough and worsening dyspnea. ER records reviewed. Tested negative for COVID and flu. Dx COPD exacerbation treated with 2 albuterol nebs and prednisone 56m 5d course, no abx. BNP, TnI, CXR reassuring.   Given ongoing hypoxia at home the next day, we urgently started home oxygen - this has not been started yet - they are coming today to set this up.   Here for ER f/u.  Since home feeling better, breathing better. He bought concentrated oxygen bottles - didn't need to use.  Notes chest burning pain, coughing up mucous in the mornings. This is despite pantoprazole 460mbid.  He's been using zarbee's elderberry immune boost (with melatonin) syrup supplement. Has felt better with this.   Would still like to see ENT for further evaluation of possible sinus disease contributing to R frontal headache (CT sinuses 06/2018). He did complete 3 wk augmentin course late March for chronic sinusitis - didn't note significant benefit on this treatment.   COPD - continues incruse ellipta with benefit.      Relevant past medical, surgical, family and social history reviewed and updated as indicated. Interim medical history since our last visit reviewed. Allergies and medications reviewed and updated. Outpatient Medications Prior to Visit  Medication Sig Dispense Refill  . ACCU-CHEK FASTCLIX LANCETS MISC Check blood sugar once daily and as instructed. Dx  250.00 100 each 3  . albuterol (PROVENTIL) (2.5 MG/3ML) 0.083% nebulizer solution Take 3 mLs (2.5 mg total) by nebulization every 6 (six) hours as needed for wheezing or shortness of breath. 150 mL 1  . albuterol (VENTOLIN HFA) 108 (90 Base) MCG/ACT inhaler INHALE 2 PUFFS BY MOUTH EVERY 6 HOURS AS NEEDED FOR WHEEZE OR SHORTNESS OF BREATH 18 g 1  . aspirin EC 81 MG tablet Take 81 mg by mouth daily.    . Blood Glucose Monitoring Suppl (BLOOD GLUCOSE MONITOR SYSTEM) W/DEVICE KIT by Does not apply route. 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 carvedilol (COREG) 6.25 MG tablet TAKE 1 TABLET BY MOUTH 2 TIMES DAILY WITH A MEAL. 180 tablet 0  . Cholecalciferol (VITAMIN D) 50 MCG (2000 UT) CAPS Take 1 capsule (2,000 Units total) by mouth daily. 30 capsule   . citalopram (CELEXA) 10 MG tablet Take 1 tablet (10 mg total) by mouth daily. 90 tablet 3  . Cyanocobalamin (B-12) 1000 MCG SUBL Place 1 tablet under the tongue daily.    . cyclobenzaprine (FLEXERIL) 10 MG tablet Take 0.5-1 tablets (5-10 mg total) by mouth 2 (two) times daily as needed for muscle spasms (sedation precautions). 30 tablet 0  . dapagliflozin propanediol (FARXIGA) 10 MG TABS tablet Take 1 tablet (10 mg total) by mouth daily before breakfast. 30 tablet 11  .  ferrous sulfate 325 (65 FE) MG tablet Take 1 tablet (325 mg total) by mouth daily with breakfast.    . fluticasone (FLONASE) 50 MCG/ACT nasal spray SPRAY 2 SPRAYS INTO EACH NOSTRIL EVERY DAY 16 mL 3  . furosemide (LASIX) 40 MG tablet TAKE 1 TABLET BY MOUTH TWICE A DAY 180 tablet 1  . glimepiride (AMARYL) 4 MG tablet Take 1 tablet (4 mg total) by mouth daily with breakfast. 90 tablet 3  . hydrOXYzine (ATARAX/VISTARIL) 50 MG tablet TAKE 1 TABLET (50 MG TOTAL) BY MOUTH 2 (TWO) TIMES DAILY AS NEEDED FOR ANXIETY. 180 tablet 1  . isosorbide mononitrate (IMDUR) 30 MG 24 hr tablet Take by mouth.    . metFORMIN (GLUCOPHAGE) 1000 MG tablet TAKE 1 TABLET  (1,000 MG TOTAL) BY MOUTH 2 (TWO) TIMES DAILY WITH A MEAL. 180 tablet 0  . montelukast (SINGULAIR) 10 MG tablet TAKE 1 TABLET BY MOUTH EVERYDAY AT BEDTIME 90 tablet 3  . ONETOUCH VERIO test strip USE AS INSTRUCTED TO CHECK THREE TIMES DAILY AND AS NEEDED. E11.8 300 strip 3  . pantoprazole (PROTONIX) 40 MG tablet Take 1 tablet (40 mg total) by mouth daily. 90 tablet 3  . Probiotic Product (PROBIOTIC DAILY PO) Take by mouth.    . simvastatin (ZOCOR) 20 MG tablet TAKE 1 TABLET BY MOUTH EVERY DAY IN THE EVENING 90 tablet 2  . umeclidinium bromide (INCRUSE ELLIPTA) 62.5 MCG/INH AEPB Inhale 1 puff into the lungs daily. 30 each 11   No facility-administered medications prior to visit.     Per HPI unless specifically indicated in ROS section below Review of Systems Objective:  BP 136/74   Pulse 88   Temp 97.9 F (36.6 C) (Temporal)   Ht 6' (1.829 m)   Wt 277 lb 5 oz (125.8 kg)   SpO2 94%   BMI 37.61 kg/m   Wt Readings from Last 3 Encounters:  08/13/20 277 lb 5 oz (125.8 kg)  08/05/20 276 lb (125.2 kg)  08/05/20 275 lb 2 oz (124.8 kg)      Physical Exam Vitals and nursing note reviewed.  Constitutional:      Appearance: Normal appearance. He is obese. He is not ill-appearing.  HENT:     Head: Normocephalic and atraumatic.     Mouth/Throat:     Mouth: Mucous membranes are moist.     Pharynx: Oropharynx is clear. No oropharyngeal exudate or posterior oropharyngeal erythema.  Eyes:     Extraocular Movements: Extraocular movements intact.     Pupils: Pupils are equal, round, and reactive to light.  Cardiovascular:     Rate and Rhythm: Normal rate and regular rhythm.     Pulses: Normal pulses.     Heart sounds: Normal heart sounds. No murmur heard.   Pulmonary:     Effort: Pulmonary effort is normal. No respiratory distress.     Breath sounds: Rhonchi (few scattered) present. No wheezing or rales.     Comments: Coarse crackles bibasilarly Neurological:     Mental Status: He is  alert.  Psychiatric:        Mood and Affect: Mood normal.        Behavior: Behavior normal.       Results for orders placed or performed during the hospital encounter of 08/05/20  Resp Panel by RT-PCR (Flu A&B, Covid) Nasopharyngeal Swab   Specimen: Nasopharyngeal Swab; Nasopharyngeal(NP) swabs in vial transport medium  Result Value Ref Range   SARS Coronavirus 2 by RT PCR NEGATIVE NEGATIVE  Influenza A by PCR NEGATIVE NEGATIVE   Influenza B by PCR NEGATIVE NEGATIVE  Basic metabolic panel  Result Value Ref Range   Sodium 140 135 - 145 mmol/L   Potassium 4.7 3.5 - 5.1 mmol/L   Chloride 98 98 - 111 mmol/L   CO2 32 22 - 32 mmol/L   Glucose, Bld 181 (H) 70 - 99 mg/dL   BUN 10 8 - 23 mg/dL   Creatinine, Ser 0.63 0.61 - 1.24 mg/dL   Calcium 9.6 8.9 - 10.3 mg/dL   GFR, Estimated >60 >60 mL/min   Anion gap 10 5 - 15  Brain natriuretic peptide  Result Value Ref Range   B Natriuretic Peptide 30.3 0.0 - 100.0 pg/mL  CBC with Differential  Result Value Ref Range   WBC 8.2 4.0 - 10.5 K/uL   RBC 5.80 4.22 - 5.81 MIL/uL   Hemoglobin 14.2 13.0 - 17.0 g/dL   HCT 47.3 39.0 - 52.0 %   MCV 81.6 80.0 - 100.0 fL   MCH 24.5 (L) 26.0 - 34.0 pg   MCHC 30.0 30.0 - 36.0 g/dL   RDW 18.1 (H) 11.5 - 15.5 %   Platelets 242 150 - 400 K/uL   nRBC 0.0 0.0 - 0.2 %   Neutrophils Relative % 64 %   Neutro Abs 5.3 1.7 - 7.7 K/uL   Lymphocytes Relative 23 %   Lymphs Abs 1.9 0.7 - 4.0 K/uL   Monocytes Relative 11 %   Monocytes Absolute 0.9 0.1 - 1.0 K/uL   Eosinophils Relative 1 %   Eosinophils Absolute 0.1 0.0 - 0.5 K/uL   Basophils Relative 1 %   Basophils Absolute 0.0 0.0 - 0.1 K/uL   Immature Granulocytes 0 %   Abs Immature Granulocytes 0.03 0.00 - 0.07 K/uL  Troponin I (High Sensitivity)  Result Value Ref Range   Troponin I (High Sensitivity) 9 <18 ng/L   Assessment & Plan:  This visit occurred during the SARS-CoV-2 public health emergency.  Safety protocols were in place, including screening  questions prior to the visit, additional usage of staff PPE, and extensive cleaning of exam room while observing appropriate contact time as indicated for disinfecting solutions.   Problem List Items Addressed This Visit    OSA (obstructive sleep apnea)    Severe OSA with AHI 77 (sleep study 06/2017). Trouble tolerating CPAP.  Has not followed up with pulm. Would be interested in eval for Inspire upper airway stimulation device. Will refer to ENT then likely return to pulm.       Relevant Orders   Ambulatory referral to ENT   Allergic rhinitis    Worsened seasonal allergic rhinitis likely contributing to recent COPD exac. He states he has recently been burning a tree in his yard.       Right temporal headache    He didn't note benefit with 3 wk augmentin course but R temporal headaches have overall improved.  CT sinuses from 06/2018 did show R frontal and ethmoid sinus disease.  Would like ENT eval for possible chronic sinusitis contribution to headache, recs on possible nasal steroid instillations vs other management.  Would also like ENT eval for possible Inspire device eligibility.       Relevant Orders   Ambulatory referral to ENT   COPD (chronic obstructive pulmonary disease) ([place])    Continue incruse ellpita daily with PRN albuterol.       COPD exacerbation ([place]) - Primary    Recent ER visit for this. Treated  with prednisone burst and overall improving. No significant oxygen need noted today - will check ambulatory pulse ox and if maintains saturations, may cancel home oxygen.           No orders of the defined types were placed in this encounter.  Orders Placed This Encounter  Procedures  . Ambulatory referral to ENT    Referral Priority:   Routine    Referral Type:   Consultation    Referral Reason:   Specialty Services Required    Requested Specialty:   Otolaryngology    Number of Visits Requested:   1    Patient Instructions  Walking oxygen test today  I'm  glad you're doing better. We will refer you to ENT for evaluation of possible sinus disease.  Continue incruse ellipta daily and as needed albuterol treatments.  May use mucinex DM.   Follow up plan: Return if symptoms worsen or fail to improve.  [name], MD

## 2020-08-13 NOTE — Assessment & Plan Note (Signed)
Recent ER visit for this. Treated with prednisone burst and overall improving. No significant oxygen need noted today - will check ambulatory pulse ox and if maintains saturations, may cancel home oxygen.

## 2020-08-13 NOTE — Assessment & Plan Note (Signed)
He didn't note benefit with 3 wk augmentin course but R temporal headaches have overall improved.  CT sinuses from 06/2018 did show R frontal and ethmoid sinus disease.  Would like ENT eval for possible chronic sinusitis contribution to headache, recs on possible nasal steroid instillations vs other management.  Would also like ENT eval for possible Inspire device eligibility.

## 2020-08-13 NOTE — Patient Instructions (Addendum)
Walking oxygen test today  I'm glad you're doing better. We will refer you to ENT for evaluation of possible sinus disease.  Continue incruse ellipta daily and as needed albuterol treatments.  May use mucinex DM.

## 2020-08-13 NOTE — Assessment & Plan Note (Signed)
Worsened seasonal allergic rhinitis likely contributing to recent COPD exac. He states he has recently been burning a tree in his yard.

## 2020-08-13 NOTE — Assessment & Plan Note (Signed)
Severe OSA with AHI 77 (sleep study 06/2017). Trouble tolerating CPAP.  Has not followed up with pulm. Would be interested in eval for Inspire upper airway stimulation device. Will refer to ENT then likely return to pulm.

## 2020-08-13 NOTE — Telephone Encounter (Signed)
Received faxed Confirmation of Order and CMN Oxygen forms from [facility].  Placed forms in Dr. Synthia Innocent box.

## 2020-08-19 ENCOUNTER — Other Ambulatory Visit: Payer: Self-pay | Admitting: Family Medicine

## 2020-08-19 NOTE — Telephone Encounter (Signed)
Patient called to check on refills. Advised patient also that he will be due for follow up in June for 3 months follow up patient will call back to get that scheduled.

## 2020-08-19 NOTE — Telephone Encounter (Signed)
ERx 

## 2020-08-22 ENCOUNTER — Telehealth: Payer: Self-pay

## 2020-08-22 MED ORDER — LOSARTAN POTASSIUM 25 MG PO TABS: 25.0000 mg | ORAL_TABLET | Freq: Every day | ORAL | 6 refills | Status: DC

## 2020-08-22 NOTE — Telephone Encounter (Signed)
BP Readings from Last 3 Encounters:  08/13/20 136/74  08/05/20 (!) 145/98  08/05/20 136/82    Noted.  plz check with patient - is he taking his BP regimen regularly?  Carvedilol 6.25mg  bid, lasix 40mg  bid, imdur 30mg  daily Would suggest adding low dose ARB which can help protect kidneys in diabetic (losartan 25mg  daily). Let me know if intersted and I will send to pharmacy

## 2020-08-22 NOTE — Telephone Encounter (Signed)
Pt is in a monitoring program with [facility] about his BP. She has noticed his diastolic numbers have been in the 90s and 100s lately: 144/97, 140/95, 147/94, 144/91, 158/99, 144/97. She said he is not having any symptoms of elevated BP issues. Wanted Dr Danise Mina to be aware as an FYI. Contact pt with any concerns.

## 2020-08-22 NOTE — Addendum Note (Signed)
Addended by: [name] on: 08/22/2020 05:31 PM   Modules accepted: Orders

## 2020-08-22 NOTE — Telephone Encounter (Addendum)
Noted. I've sent in losartan 25mg  to take one pill daily - call us in 2 wks with how he's doing on med.  imdur removed from med list.

## 2020-08-22 NOTE — Telephone Encounter (Signed)
Lvm asking pt to call back.  Need to get answer to Dr. Synthia Innocent question and relay his message.

## 2020-08-22 NOTE — Addendum Note (Signed)
Addended by: Carter Kitten on: 08/22/2020 05:05 PM   Modules accepted: Orders

## 2020-08-22 NOTE — Telephone Encounter (Signed)
Spoke with [name].  He states he is taking everything except the Imdur.  He was not aware that he was suppose to be taking Imdur.  Patient thinks his BPs have been running a little high because he pulled something in his back this week and has been in pain.  He took a muscle relaxant last night that Dr. Darnell Level had prescribed and rested well last night.  He states he really don't want to add anymore medication but he is agreeable to taking the Losartan, for a little while, if Dr. Darnell Level wants to send it in.

## 2020-08-25 NOTE — Telephone Encounter (Signed)
Spoke with pt relaying Dr. G's message. Pt verbalizes understanding.  

## 2020-09-01 ENCOUNTER — Telehealth: Payer: Self-pay

## 2020-09-01 DIAGNOSIS — J449 Chronic obstructive pulmonary disease, unspecified: Secondary | ICD-10-CM

## 2020-09-01 MED ORDER — SPIRIVA RESPIMAT 2.5 MCG/ACT IN AERS: 2.0000 | INHALATION_SPRAY | Freq: Every day | RESPIRATORY_TRACT | 11 refills | Status: DC

## 2020-09-01 NOTE — Telephone Encounter (Signed)
plz notify I've sent in spiriva handihaler to price out in place of incruse ellipta (umeclidinium bromide) If also unaffordable, have pharmacist check for cheaper alternative on insurance formulary.

## 2020-09-01 NOTE — Telephone Encounter (Signed)
Patient called and stated that recently his prescription for umeclidinium bromide Inhaler has increased to $100 a month and the patient wanted to know if there is a similar medication that he can switch to that is cheaper? Please advise.

## 2020-09-01 NOTE — Addendum Note (Signed)
Addended by: [name] on: 09/01/2020 02:06 PM   Modules accepted: Orders

## 2020-09-02 MED ORDER — IPRATROPIUM BROMIDE HFA 17 MCG/ACT IN AERS: 2.0000 | INHALATION_SPRAY | Freq: Four times a day (QID) | RESPIRATORY_TRACT | 12 refills | Status: DC | PRN

## 2020-09-02 MED ORDER — FLUTICASONE-SALMETEROL 100-50 MCG/ACT IN AEPB: 1.0000 | INHALATION_SPRAY | Freq: Two times a day (BID) | RESPIRATORY_TRACT | 11 refills | Status: DC

## 2020-09-02 NOTE — Telephone Encounter (Signed)
Spoke with pt relaying Dr. Synthia Innocent message.  States Spiriva is $136/mthly and Incruse is $94/mthly.  Says he would have to go with the Incruse Ellipta but that's still too much.  Pt is asking is there possibly any other alternative medication.   Also, the Wilder Glade is $47/mthly and he doesn't see that it's doing anything.

## 2020-09-02 NOTE — Addendum Note (Signed)
Addended by: [name] on: 09/02/2020 02:08 PM   Modules accepted: Orders

## 2020-09-02 NOTE — Telephone Encounter (Addendum)
Please have him price out atrovent inhaler 2 puffs Q6 hours scheduled this is shorter acting than incruse but same type of medicine. I would also have him price out advair (combination inhaled steroid and long acting bronchodilator) to take every day. Both these medicines would replace incruse elipta or spiriva.   Regarding farxiga, this medicine takes a while to take effect, would encourage he continue to take this.

## 2020-09-03 NOTE — Telephone Encounter (Signed)
Lvm asking pt to call back.  Need to relay Dr. G's message.  

## 2020-09-04 NOTE — Telephone Encounter (Signed)
Spoke with pt relaying Dr. Synthia Innocent message.  Pt verbalizes understanding and will let us know if one of these are doable.

## 2020-09-04 NOTE — Telephone Encounter (Signed)
[place] Night - Client Nonclinical Telephone Record [facility] Client [place] Night - Client Client Site [place] - Night Contact Type Call Who Is Calling Patient / Member / Family / Caregiver Caller Name [place] Phone Number [phone] Call Type Message Only Information Provided Reason for Call Returning a Call from the Office Initial Congers states returning lisas call from the office Disp. Time Disposition Final User 09/03/2020 5:04:07 PM General Information Provided Yes Shirlee Latch Call Closed By: Shirlee Latch Transaction Date/Time: 09/03/2020 5:02:30 PM (ET)

## 2020-09-21 ENCOUNTER — Other Ambulatory Visit: Payer: Self-pay | Admitting: Family Medicine

## 2020-09-24 NOTE — Telephone Encounter (Signed)
Flexeril Last filled:  04/14/20, #30 Last OV:  08/13/20, COPD exacerbation Next OV:  none

## 2020-10-07 ENCOUNTER — Telehealth: Payer: Self-pay

## 2020-10-07 NOTE — Chronic Care Management (AMB) (Addendum)
Chronic Care Management Pharmacy Assistant   Name: [name]  MRN: [id number] DOB: 12-16-1955   Reason for Encounter: Disease State - Diabetes Review   Recent office visits:  08/13/20 - Dr.Gutierrez, PCP - Referral for ENT. Continue Incruse Ellipta daily and as needed albuterol treatments. May use Mucinex DM.  08/05/20 - Dr.Gutierrez, PCP - Recommend ER visit for productive cough, low O2 07/16/20 - Dr.Gutierrez, PCP - Increase farxiga to 20m daily, new dose at pharmacy. For headache - trial prolonged antibiotic Amoxicillin [phone]  course for 3 weeks. Return in 3 months for diabetes follow up visit.  06/24/20 - Dr. GDanise Mina PCP - Labs ordered no medication changes 06/04/20 - Dr. GDanise Mina PCP - Referral to Ophthalmology and Pulmonology. Decrease pantoprazole to 441monce daily. Price out incruse ellipta daily inhaler for COPD  Recent consult visits:  None since last CCM contact  [facility]  [facility] Prednisone 2084[facility] 3 tablets daily at breakfast for 5 days  Medications: Outpatient Encounter Medications as of 10/07/2020  Medication Sig Note   ACCU-CHEK FASTCLIX LANCETS MISC Check blood sugar once daily and as instructed. Dx 250.00    albuterol (PROVENTIL) (2.5 MG/3ML) 0.083% nebulizer solution TAKE 3 MLS (2.5 MG TOTAL) BY NEBULIZATION EVERY 6 (SIX) HOURS AS NEEDED FOR WHEEZING OR SHORTNESS OF BREATH.    albuterol (VENTOLIN HFA) 108 (90 Base) MCG/ACT inhaler INHALE 2 PUFFS BY MOUTH EVERY 6 HOURS AS NEEDED FOR WHEEZE OR SHORTNESS OF BREATH    aspirin EC 81 MG tablet Take 81 mg by mouth daily.    Blood Glucose Monitoring Suppl (BLOOD GLUCOSE MONITOR SYSTEM) W/DEVICE KIT by Does not apply route. Use to check sugar once daily and as needed Dx: E11.9 **ONE TOUCH VERIO**    carvedilol (COREG) 6.25 MG tablet TAKE 1 TABLET BY MOUTH 2 TIMES DAILY WITH A MEAL.    Cholecalciferol (VITAMIN D) 50 MCG (2000 UT) CAPS Take 1 capsule (2,000 Units total) by mouth daily.     citalopram (CELEXA) 10 MG tablet Take 1 tablet (10 mg total) by mouth daily.    Cyanocobalamin (B-12) 1000 MCG SUBL Place 1 tablet under the tongue daily. 06/20/2019: Not SL, takes tablet   cyclobenzaprine (FLEXERIL) 10 MG tablet TAKE 1/2 TO 1 TABLET BY MOUTH TWICE A DAY AS NEEDED FOR MUSCLE SPASMS(SEDATION PRECAUTIONS)    dapagliflozin propanediol (FARXIGA) 10 MG TABS tablet Take 1 tablet (10 mg total) by mouth daily before breakfast.    ferrous sulfate 325 (65 FE) MG tablet Take 1 tablet (325 mg total) by mouth daily with breakfast.    fluticasone (FLONASE) 50 MCG/ACT nasal spray SPRAY 2 SPRAYS INTO EACH NOSTRIL EVERY DAY    fluticasone-salmeterol (ADVAIR) 100-50 MCG/ACT AEPB Inhale 1 puff into the lungs 2 (two) times daily.    furosemide (LASIX) 40 MG tablet TAKE 1 TABLET BY MOUTH TWICE A DAY    glimepiride (AMARYL) 4 MG tablet TAKE 1 TABLET BY MOUTH DAILY WITH BREAKFAST    hydrOXYzine (ATARAX/VISTARIL) 50 MG tablet TAKE 1 TABLET (50 MG TOTAL) BY MOUTH 2 (TWO) TIMES DAILY AS NEEDED FOR ANXIETY.    ipratropium (ATROVENT HFA) 17 MCG/ACT inhaler Inhale 2 puffs into the lungs every 6 (six) hours as needed for wheezing.    losartan (COZAAR) 25 MG tablet Take 1 tablet (25 mg total) by mouth daily.    metFORMIN (GLUCOPHAGE) 1000 MG tablet TAKE 1 TABLET (1,000 MG TOTAL) BY MOUTH 2 (TWO) TIMES DAILY WITH A MEAL.  montelukast (SINGULAIR) 10 MG tablet TAKE 1 TABLET BY MOUTH EVERYDAY AT BEDTIME    ONETOUCH VERIO test strip USE AS INSTRUCTED TO CHECK THREE TIMES DAILY AND AS NEEDED. E11.8    pantoprazole (PROTONIX) 40 MG tablet TAKE 1 TABLET (40 MG TOTAL) BY MOUTH 2 (TWO) TIMES DAILY BEFORE A MEAL.    Probiotic Product (PROBIOTIC DAILY PO) Take by mouth.    simvastatin (ZOCOR) 20 MG tablet TAKE 1 TABLET BY MOUTH EVERY DAY IN THE EVENING    umeclidinium bromide (INCRUSE ELLIPTA) 62.5 MCG/INH AEPB Inhale 1 puff into the lungs daily.    No facility-administered encounter medications on file as of 10/07/2020.     Recent Relevant Labs: Lab Results  Component Value Date/Time   HGBA1C 8.7 (H) 07/09/2020 07:57 AM   HGBA1C 8.2 (H) 04/14/2020 08:41 AM   HGBA1C 7.0 (H) 01/24/2013 04:07 AM   MICROALBUR <0.7 07/09/2020 08:22 AM   MICROALBUR 1.0 10/17/2017 11:35 AM    Kidney Function Lab Results  Component Value Date/Time   CREATININE 0.63 08/05/2020 01:10 PM   CREATININE 0.72 07/09/2020 07:57 AM   CREATININE 0.82 06/20/2013 11:39 AM   CREATININE 0.80 01/24/2013 04:07 AM   GFR 96.34 07/09/2020 07:57 AM   GFRNONAA >60 08/05/2020 01:10 PM   GFRNONAA >60 06/20/2013 11:39 AM   GFRAA >60 12/22/2018 09:28 AM   GFRAA >60 06/20/2013 11:39 AM    Current antihyperglycemic regimen:  Glimepiride 4 mg - 1 tablet daily with breakfast Metformin 1000 mg - 1 tablet twice daily with meals       Farxiga 54m. -1 tablet before breakfast   Adherence Review: Is the patient currently on a STATIN medication? Yes Is the patient currently on ACE/ARB medication? Yes Does the patient have >5 day gap between last estimated fill dates? No  Star Rating Drugs: Medication:  Last Fill: Day Supply Metformin 10031m4/26/22 90 Losartan 2549m/29/22 90 Glimepiride 4mg54m6/22  90 Faxiga 10mg24m22/22 30 Simvastatin 20mg 35m/22 90  Attempted contact with WilliaMalachy Chamberrr 3 times on 10/07/20,10/10/20,10/13/20. Unsuccessful outreach. Will attempt contact next month.   Follow-Up:  Pharmacist Review  MichelDebbora Dusnotified  VelmenAvel Sensor[facility]erson [facility] Services[facility] Pharmacy Assistant [phone] reviewed the care management and care coordination activities outlined in this encounter and I am certifying that I agree with the content of this note. Scheduled f/u CCM 10/24/20 in attempt to reconnect.  MichelDebbora DusmD Clinical Pharmacist [phone]

## 2020-10-24 ENCOUNTER — Telehealth: Payer: Self-pay

## 2020-10-24 ENCOUNTER — Telehealth: Payer: Medicare Other

## 2020-10-24 NOTE — Progress Notes (Deleted)
Chronic Care Management Pharmacy Note  10/24/2020 Name:  [name] MRN:  [id number] DOB:  1955/10/21  Summary: ***  Recommendations/Changes made from today's visit: ***  Plan: ***   Subjective: [name] is an 65 y.o. year old male who is a primary patient of [name], MD.  The CCM team was consulted for assistance with disease management and care coordination needs.    {CCMTELEPHONEFACETOFACE:21091510} for {CCMINITIALFOLLOWUPCHOICE:21091511} in response to provider referral for pharmacy case management and/or care coordination services.   Consent to Services:  {CCMCONSENTOPTIONS:25074}  Patient Care Team: [name], MD as PCP - General (Family Medicine) Thompson Grayer, MD as Attending Physician (Cardiology) Corey Skains, MD as Referring Physician (Internal Medicine) Debbora Dus, [facility] as Pharmacist (Pharmacist)  Recent office visits: ***  Recent consult visits: [facility] visits: {Hospital DC Yes/No:25215}   Objective:  Lab Results  Component Value Date   CREATININE 0.63 08/05/2020   BUN 10 08/05/2020   GFR 96.34 07/09/2020   GFRNONAA >60 08/05/2020   GFRAA >60 12/22/2018   NA 140 08/05/2020   K 4.7 08/05/2020   CALCIUM 9.6 08/05/2020   CO2 32 08/05/2020   GLUCOSE 181 (H) 08/05/2020    Lab Results  Component Value Date/Time   HGBA1C 8.7 (H) 07/09/2020 07:57 AM   HGBA1C 8.2 (H) 04/14/2020 08:41 AM   HGBA1C 7.0 (H) 01/24/2013 04:07 AM   GFR 96.34 07/09/2020 07:57 AM   GFR 101.45 04/14/2020 08:41 AM   MICROALBUR <0.7 07/09/2020 08:22 AM   MICROALBUR 1.0 10/17/2017 11:35 AM    Last diabetic Eye exam:  Lab Results  Component Value Date/Time   HMDIABEYEEXA No Retinopathy 06/17/2020 12:00 AM    Last diabetic Foot exam: No results found for: HMDIABFOOTEX   Lab Results  Component Value Date   CHOL 185 07/09/2020   HDL 36.70 (L) 07/09/2020   [place] 90 09/07/2013   LDLDIRECT 97.0 07/09/2020   TRIG (H)  07/09/2020    451.0 Triglyceride is over 400; calculations on Lipids are invalid.   CHOLHDL 5 07/09/2020    Hepatic Function Latest Ref Rng & Units 07/09/2020 02/08/2020 01/04/2020  Total Protein 6.0 - 8.3 g/dL 6.6 7.1 6.9  Albumin 3.5 - 5.2 g/dL 4.1 4.0 4.6  AST 0 - 37 U/L 24 28 38(H)  ALT 0 - 53 U/L '23 21 30  ' Alk Phosphatase 39 - 117 U/L 66 51 64  Total Bilirubin 0.2 - 1.2 mg/dL 0.4 0.6 0.5    Lab Results  Component Value Date/Time   TSH 2.63 10/02/2019 12:05 PM   TSH 2.37 06/22/2016 11:33 AM    CBC Latest Ref Rng & Units 08/05/2020 06/24/2020 02/08/2020  WBC 4.0 - 10.5 K/uL 8.2 8.7 9.5  Hemoglobin 13.0 - 17.0 g/dL 14.2 12.2(L) 13.1  Hematocrit 39.0 - 52.0 % 47.3 39.5 41.6  Platelets 150 - 400 K/uL 242 224.0 224    Lab Results  Component Value Date/Time   VD25OH 29.46 (L) 07/09/2020 07:57 AM   VD25OH 27.47 (L) 10/02/2019 02:55 PM    Clinical ASCVD: {YES/NO:21197} The 10-year ASCVD risk score Mikey Bussing DC Jr., et al., 2013) is: 32.4%   Values used to calculate the score:     Age: 5 years     Sex: Male     Is Non-Hispanic African American: No     Diabetic: Yes     Tobacco smoker: No     Systolic Blood Pressure: 630 mmHg     Is BP treated: Yes  HDL Cholesterol: 36.7 mg/dL     Total Cholesterol: 185 mg/dL    Depression screen [facility] 2/9 07/09/2020 01/05/2018 10/17/2017  Decreased Interest 0 0 0  Down, Depressed, Hopeless 0 0 0  PHQ - 2 Score 0 0 0  Altered sleeping 0 - -  Tired, decreased energy 0 - -  Change in appetite 0 - -  Feeling bad or failure about yourself  0 - -  Trouble concentrating 0 - -  Moving slowly or fidgety/restless 0 - -  Suicidal thoughts 0 - -  PHQ-9 Score 0 - -  Difficult doing work/chores Not difficult at all - -  Some recent data might be hidden     ***Other: (CHADS2VASc if Afib, MMRC or CAT for COPD, ACT, DEXA)  Social History   Tobacco Use  Smoking Status Former   Packs/day: 1.00   Years: 31.00   Pack years: 31.00   Types:  Cigarettes   Quit date: 04/26/2018   Years since quitting: 2.4  Smokeless Tobacco Never   BP Readings from Last 3 Encounters:  08/13/20 136/74  08/05/20 (!) 145/98  08/05/20 136/82   Pulse Readings from Last 3 Encounters:  08/13/20 88  08/05/20 70  08/05/20 79   Wt Readings from Last 3 Encounters:  08/13/20 277 lb 5 oz (125.8 kg)  08/05/20 276 lb (125.2 kg)  08/05/20 275 lb 2 oz (124.8 kg)   BMI Readings from Last 3 Encounters:  08/13/20 37.61 kg/m  08/05/20 37.43 kg/m  08/05/20 38.37 kg/m    Assessment/Interventions: Review of patient past medical history, allergies, medications, health status, including review of consultants reports, laboratory and other test data, was performed as part of comprehensive evaluation and provision of chronic care management services.   SDOH:  (Social Determinants of Health) assessments and interventions performed: {yes/no:20286}  SDOH Screenings   Alcohol Screen: Low Risk    Last Alcohol Screening Score (AUDIT): 0  Depression (PHQ2-9): Low Risk    PHQ-2 Score: 0  Financial Resource Strain: Low Risk    Difficulty of Paying Living Expenses: Not hard at all  Food Insecurity: No Food Insecurity   Worried About Charity fundraiser in the Last Year: Never true   Ran Out of Food in the Last Year: Never true  Housing: Low Risk    Last Housing Risk Score: 0  Physical Activity: Inactive   Days of Exercise per Week: 0 days   Minutes of Exercise per Session: 0 min  Social Connections: Not on file  Stress: No Stress Concern Present   Feeling of Stress : Not at all  Tobacco Use: Medium Risk   Smoking Tobacco Use: Former   Smokeless Tobacco Use: Never  Transportation Needs: No Data processing manager (Medical): No   Lack of Transportation (Non-Medical): No    CCM Care Plan  Allergies  Allergen Reactions   Atorvastatin Other (See Comments)    Dizziness   Lisinopril Cough    Medications Reviewed Today      Reviewed by [name], MD (Physician) on 08/13/20 at [place] List Status: <None>   Medication Order Taking? Sig Documenting Provider Last Dose Status Informant  ACCU-CHEK FASTCLIX LANCETS [place] [id number] Yes Check blood sugar once daily and as instructed. Dx 250.00 [name], MD Taking Active   albuterol (PROVENTIL) (2.5 MG/3ML) 0.083% nebulizer solution [id number] Yes Take 3 mLs (2.5 mg total) by nebulization every 6 (six) hours as needed for wheezing or shortness of breath. Danise Mina,  Garlon Hatchet, MD Taking Active   albuterol (VENTOLIN HFA) 108 (90 Base) MCG/ACT inhaler [id number] Yes INHALE 2 PUFFS BY MOUTH EVERY 6 HOURS AS NEEDED FOR WHEEZE OR SHORTNESS OF Alvester Morin, MD Taking Active   aspirin EC 81 MG tablet [id number] Yes Take 81 mg by mouth daily. [provider] Taking Active   Blood Glucose Monitoring Suppl (BLOOD GLUCOSE MONITOR SYSTEM) W/DEVICE KIT [id number] Yes by Does not apply route. Use to check sugar once daily and as needed Dx: E11.9 **ONE TOUCH VERIO** [provider] Taking Active   carvedilol (COREG) 6.25 MG tablet [id number] Yes TAKE 1 TABLET BY MOUTH 2 TIMES DAILY WITH A MEAL. [name], MD Taking Active   Cholecalciferol (VITAMIN D) 50 MCG (2000 UT) CAPS [id number] Yes Take 1 capsule (2,000 Units total) by mouth daily. [name], MD Taking Active   citalopram (CELEXA) 10 MG tablet [id number] Yes Take 1 tablet (10 mg total) by mouth daily. [name], MD Taking Active   Cyanocobalamin (B-12) 1000 MCG SUBL [id number] Yes Place 1 tablet under the tongue daily. [name], MD Taking Active            Med Note Andree Elk, [facility]   Wed Jun 20, 2019  9:24 AM) Not SL, takes tablet  cyclobenzaprine (FLEXERIL) 10 MG tablet [id number] Yes Take 0.5-1 tablets (5-10 mg total) by mouth 2 (two) times daily as needed for muscle spasms (sedation precautions). [name], MD Taking Active   dapagliflozin propanediol (FARXIGA)  10 MG TABS tablet [id number] Yes Take 1 tablet (10 mg total) by mouth daily before breakfast. [name], MD Taking Active   ferrous sulfate 325 (65 FE) MG tablet [id number] Yes Take 1 tablet (325 mg total) by mouth daily with breakfast. [name], MD Taking Active   fluticasone [facility]) 50 MCG/ACT nasal spray [id number] Yes SPRAY 2 SPRAYS INTO EACH NOSTRIL EVERY Velora Heckler, MD Taking Active   furosemide (LASIX) 40 MG tablet [id number] Yes TAKE 1 TABLET BY MOUTH TWICE A Velora Heckler, MD Taking Active   glimepiride (AMARYL) 4 MG tablet [id number] Yes Take 1 tablet (4 mg total) by mouth daily with breakfast. [name], MD Taking Active   hydrOXYzine (ATARAX/VISTARIL) 50 MG tablet [id number] Yes TAKE 1 TABLET (50 MG TOTAL) BY MOUTH 2 (TWO) TIMES DAILY AS NEEDED FOR ANXIETY. [name], MD Taking Active   isosorbide mononitrate (IMDUR) 30 MG 24 hr tablet [id number] Yes Take by mouth. [provider] Taking Active   metFORMIN (GLUCOPHAGE) 1000 MG tablet [id number] Yes TAKE 1 TABLET (1,000 MG TOTAL) BY MOUTH 2 (TWO) TIMES DAILY WITH A MEAL. [name], MD Taking Active   montelukast (SINGULAIR) 10 MG tablet [id number] Yes TAKE 1 TABLET BY MOUTH EVERYDAY AT BEDTIME [name], MD Taking Active   [facility] VERIO test strip [id number] Yes USE AS INSTRUCTED TO CHECK THREE TIMES DAILY AND AS NEEDED. E11.8 [name], MD Taking Active   pantoprazole (PROTONIX) 40 MG tablet [id number] Yes Take 1 tablet (40 mg total) by mouth daily. [name], MD Taking Active   Probiotic Product (PROBIOTIC DAILY PO) [id number] Yes Take by mouth. [provider] Taking Active   simvastatin (ZOCOR) 20 MG tablet [id number] Yes TAKE 1 TABLET BY MOUTH EVERY DAY IN THE Recardo Evangelist, MD Taking Active   umeclidinium bromide (INCRUSE ELLIPTA) 62.5 MCG/INH AEPB [id number] Yes Inhale 1 puff into the lungs daily. [name], MD Taking Active    Med List Note Chauncey Fischer, RN 01/05/18 1237):  UDS 01/05/18            Patient Active Problem List   Diagnosis Date Noted   COPD exacerbation ([place]) 08/05/2020   Vitreous floaters of left eye 06/05/2020   PLMD (periodic limb movement disorder) 06/05/2020   Ulceration of intestine    Neck pain on left side 02/25/2020   Mediastinal mass 01/05/2020   Skin rash 10/12/2019   Vitamin D deficiency 04/19/2019   Low serum vitamin B12 04/19/2019   Iron deficiency anemia 01/02/2019   Personal history of tobacco use, presenting hazards to health 11/07/2018   Epigastric abdominal pain 09/19/2018   COPD (chronic obstructive pulmonary disease) ([place]) 07/25/2018   Chronic neck pain (Tertiary Area of Pain) (R>L) 01/05/2018   Chronic upper extremity pain (Fourth Area of Pain) (R>L) 01/05/2018   Chronic upper back pain 01/05/2018   Chronic bilateral low back pain without sciatica 01/05/2018   Chronic pain syndrome 01/05/2018   Right temporal headache 10/17/2017   Right leg weakness 07/14/2017   AAA (abdominal aortic aneurysm) without rupture ([place]) 09/15/2016   Adrenal adenoma, left 08/13/2016   Nodule of right lung 08/13/2016   Fatty liver 08/01/2016   Right flank pain 07/26/2016   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[place]) 07/26/2016   Right shoulder pain 07/26/2016   Restrictive lung disease 25/00/3704   Chronic systolic CHF (congestive heart failure) ([place])    Tinea pedis 06/30/2015   Gassiness 02/21/2015   Advanced care planning/counseling discussion 10/16/2014   Encounter for general adult medical examination with abnormal findings 10/16/2014   Diabetes mellitus type 2, uncontrolled, with complications ([place]) 88/89/1694   Medicare annual wellness visit, subsequent 50/38/8828   Umbilical hernia without obstruction and without gangrene 09/03/2013   Left flank mass 09/03/2013   Hyperlipidemia associated with type 2 diabetes mellitus ([place]) 06/29/2013   Coronary artery disease 06/29/2013   Abnormal  toxicological findings 06/24/2013   Exertional dyspnea 06/14/2013   Malaise and fatigue 03/14/2013   Hypertension    Panic attacks    History of atrial flutter 07/07/2011   Severe obesity (BMI 35.0-39.9) with comorbidity ([place]) 07/07/2011   Ex-smoker 08/13/2009   OSA (obstructive sleep apnea) 08/13/2009   DDD (degenerative disc disease), cervical 08/13/2009   Allergic rhinitis 06/24/2009    Immunization History  Administered Date(s) Administered   Influenza,inj,Quad PF,6+ Mos 05/29/2014, 02/21/2015, 02/22/2017, 03/16/2018, 01/02/2019, 01/04/2020   Influenza-Unspecified 01/24/2013   PFIZER(Purple Top)SARS-COV-2 Vaccination 07/12/2019, 08/02/2019, 03/19/2020   Pneumococcal Polysaccharide-23 02/24/2013   Td 04/26/2006    Conditions to be addressed/monitored:  {USCCMDZASSESSMENTOPTIONS:23563}  There are no care plans that you recently modified to display for this patient.    Medication Assistance: {MEDASSISTANCEINFO:25044}  Compliance/Adherence/Medication fill history: Care Gaps: ***  Star-Rating Drugs: ***  Patient's preferred pharmacy is:  [id number] [place] [id number]: [phone]: [phone] Uses pill box? {Yes or If no, why not?:20788} Pt endorses ***% compliance  We discussed: {Pharmacy options:24294} Patient decided to: {US Pharmacy PMBEM:75449} Care Plan and Follow Up Patient Decision:  {FOLLOWUP:24991}  Plan: {CM FOLLOW UP PEEFE:07121} MDebbora Dus PharmD Clinical Pharmacist [phone]

## 2020-10-24 NOTE — Telephone Encounter (Signed)
  Chronic Care Management   Outreach Note  10/24/2020 Name: [name] MRN: [id number] DOB: 1956/04/02  Referred by: [name], MD Reason for referral : Missed CCM Call   An unsuccessful telephone outreach was attempted today. The patient was referred to the pharmacist for assistance with care management and care coordination.   Last CCM visit 08/2019, left VM asking for patient to let us know if he would like to continue in CCM program due to multiple missed calls.  Follow Up Plan: Will await return call  Debbora Dus, PharmD Clinical Pharmacist [place] [facility] at [facility] [phone]

## 2020-10-28 ENCOUNTER — Other Ambulatory Visit: Payer: Self-pay | Admitting: Family Medicine

## 2020-11-07 ENCOUNTER — Telehealth: Payer: Self-pay

## 2020-11-07 NOTE — Progress Notes (Addendum)
Chronic Care Management Pharmacy Assistant   Name: [name]  MRN: [id number] DOB: 1955/12/28  Reason for Encounter: Diabetes Disease State, reschedule missed CCM appointment   Recent office visits:  None since last CCM contact  Recent consult visits:  None since last CCM contact  Hospital visits:  08/05/20 ED Visit for COPD exacerbation. No admission.   Medications: Outpatient Encounter Medications as of 11/07/2020  Medication Sig Note   ACCU-CHEK FASTCLIX LANCETS MISC Check blood sugar once daily and as instructed. Dx 250.00    albuterol (PROVENTIL) (2.5 MG/3ML) 0.083% nebulizer solution TAKE 3 MLS (2.5 MG TOTAL) BY NEBULIZATION EVERY 6 (SIX) HOURS AS NEEDED FOR WHEEZING OR SHORTNESS OF BREATH.    albuterol (VENTOLIN HFA) 108 (90 Base) MCG/ACT inhaler INHALE 2 PUFFS BY MOUTH EVERY 6 HOURS AS NEEDED FOR WHEEZE OR SHORTNESS OF BREATH    aspirin EC 81 MG tablet Take 81 mg by mouth daily.    Blood Glucose Monitoring Suppl (BLOOD GLUCOSE MONITOR SYSTEM) W/DEVICE KIT by Does not apply route. Use to check sugar once daily and as needed Dx: E11.9 **ONE TOUCH VERIO**    carvedilol (COREG) 6.25 MG tablet TAKE 1 TABLET BY MOUTH 2 TIMES DAILY WITH A MEAL.    Cholecalciferol (VITAMIN D) 50 MCG (2000 UT) CAPS Take 1 capsule (2,000 Units total) by mouth daily.    citalopram (CELEXA) 10 MG tablet Take 1 tablet (10 mg total) by mouth daily.    Cyanocobalamin (B-12) 1000 MCG SUBL Place 1 tablet under the tongue daily. 06/20/2019: Not SL, takes tablet   cyclobenzaprine (FLEXERIL) 10 MG tablet TAKE 1/2 TO 1 TABLET BY MOUTH TWICE A DAY AS NEEDED FOR MUSCLE SPASMS(SEDATION PRECAUTIONS)    dapagliflozin propanediol (FARXIGA) 10 MG TABS tablet Take 1 tablet (10 mg total) by mouth daily before breakfast.    ferrous sulfate 325 (65 FE) MG tablet Take 1 tablet (325 mg total) by mouth daily with breakfast.    fluticasone (FLONASE) 50 MCG/ACT nasal spray SPRAY 2 SPRAYS INTO EACH NOSTRIL EVERY DAY     fluticasone-salmeterol (ADVAIR) 100-50 MCG/ACT AEPB Inhale 1 puff into the lungs 2 (two) times daily.    furosemide (LASIX) 40 MG tablet TAKE 1 TABLET BY MOUTH TWICE A DAY    glimepiride (AMARYL) 4 MG tablet TAKE 1 TABLET BY MOUTH DAILY WITH BREAKFAST    hydrOXYzine (ATARAX/VISTARIL) 50 MG tablet TAKE 1 TABLET (50 MG TOTAL) BY MOUTH 2 (TWO) TIMES DAILY AS NEEDED FOR ANXIETY.    ipratropium (ATROVENT HFA) 17 MCG/ACT inhaler Inhale 2 puffs into the lungs every 6 (six) hours as needed for wheezing.    losartan (COZAAR) 25 MG tablet Take 1 tablet (25 mg total) by mouth daily.    metFORMIN (GLUCOPHAGE) 1000 MG tablet TAKE 1 TABLET (1,000 MG TOTAL) BY MOUTH 2 (TWO) TIMES DAILY WITH A MEAL.    montelukast (SINGULAIR) 10 MG tablet TAKE 1 TABLET BY MOUTH EVERYDAY AT BEDTIME    ONETOUCH VERIO test strip USE AS INSTRUCTED TO CHECK THREE TIMES DAILY AND AS NEEDED. E11.8    pantoprazole (PROTONIX) 40 MG tablet TAKE 1 TABLET (40 MG TOTAL) BY MOUTH 2 (TWO) TIMES DAILY BEFORE A MEAL.    Probiotic Product (PROBIOTIC DAILY PO) Take by mouth.    simvastatin (ZOCOR) 20 MG tablet TAKE 1 TABLET BY MOUTH EVERY DAY IN THE EVENING    umeclidinium bromide (INCRUSE ELLIPTA) 62.5 MCG/INH AEPB Inhale 1 puff into the lungs daily.    No facility-administered encounter  medications on file as of 11/07/2020.     Recent Relevant Labs: Lab Results  Component Value Date/Time   HGBA1C 8.7 (H) 07/09/2020 07:57 AM   HGBA1C 8.2 (H) 04/14/2020 08:41 AM   HGBA1C 7.0 (H) 01/24/2013 04:07 AM   MICROALBUR <0.7 07/09/2020 08:22 AM   MICROALBUR 1.0 10/17/2017 11:35 AM    Kidney Function Lab Results  Component Value Date/Time   CREATININE 0.63 08/05/2020 01:10 PM   CREATININE 0.72 07/09/2020 07:57 AM   CREATININE 0.82 06/20/2013 11:39 AM   CREATININE 0.80 01/24/2013 04:07 AM   GFR 96.34 07/09/2020 07:57 AM   GFRNONAA >60 08/05/2020 01:10 PM   GFRNONAA >60 06/20/2013 11:39 AM   GFRAA >60 12/22/2018 09:28 AM   GFRAA >60  06/20/2013 11:39 AM   Attempted contact with [name] 3 times on . Unsuccessful outreach, will attempt again next month.    Adherence Review: Is the patient currently on a STATIN medication? No Is the patient currently on ACE/ARB medication? Yes Does the patient have >5 day gap between last estimated fill dates? Yes- [place] Gaps: Last annual wellness visit: 07/16/20  Last eye exam / retinopathy screening: 06/17/20 Last diabetic foot exam:  Counseled patient on importance of annual eye and foot exam.   Star Rating Drugs:  Medication:  Last Fill: Day Supply Metformin 1000 mg 08/19/20 90 Losartan 25 mg 08/22/20 90 Glimepiride 4 mg 08/29/20  90 Farxiga 10 mg  08/15/20 30  No appointments scheduled within the next 30 days.  Debbora Dus, CPP notified  [name], [place] Pharmacy Assistant [phone]  I have reviewed the care management and care coordination activities outlined in this encounter and I am certifying that I agree with the content of this note. No further action required.  Debbora Dus, PharmD Clinical Pharmacist [facility] at [facility] [phone]

## 2020-11-14 ENCOUNTER — Other Ambulatory Visit: Payer: Self-pay | Admitting: Family Medicine

## 2020-12-16 ENCOUNTER — Ambulatory Visit: Payer: Medicare Other | Admitting: Family Medicine

## 2020-12-19 ENCOUNTER — Other Ambulatory Visit: Payer: Self-pay | Admitting: Family Medicine

## 2020-12-31 ENCOUNTER — Telehealth: Payer: Self-pay

## 2020-12-31 NOTE — Chronic Care Management (AMB) (Addendum)
Chronic Care Management Pharmacy Assistant   Name: XAVIER MUNGER  MRN: [id number] DOB: 10/18/1955  Reason for Encounter: Diabetes Review  Recent office visits:  None since last CCM contact  Recent consult visits:  None since last CCM contact  Hospital visits:  08/05/20 ED Visit for COPD exacerbation. No admission.   Medications: Outpatient Encounter Medications as of 12/31/2020  Medication Sig Note   ACCU-CHEK FASTCLIX LANCETS MISC Check blood sugar once daily and as instructed. Dx 250.00    albuterol (PROVENTIL) (2.5 MG/3ML) 0.083% nebulizer solution TAKE 3 MLS (2.5 MG TOTAL) BY NEBULIZATION EVERY 6 (SIX) HOURS AS NEEDED FOR WHEEZING OR SHORTNESS OF BREATH.    albuterol (VENTOLIN HFA) 108 (90 Base) MCG/ACT inhaler INHALE 2 PUFFS BY MOUTH EVERY 6 HOURS AS NEEDED FOR WHEEZE OR SHORTNESS OF BREATH    aspirin EC 81 MG tablet Take 81 mg by mouth daily.    Blood Glucose Monitoring Suppl (BLOOD GLUCOSE MONITOR SYSTEM) W/DEVICE KIT by Does not apply route. Use to check sugar once daily and as needed Dx: E11.9 **ONE TOUCH VERIO**    carvedilol (COREG) 6.25 MG tablet TAKE 1 TABLET BY MOUTH 2 TIMES DAILY WITH A MEAL.    Cholecalciferol (VITAMIN D) 50 MCG (2000 UT) CAPS Take 1 capsule (2,000 Units total) by mouth daily.    citalopram (CELEXA) 10 MG tablet Take 1 tablet (10 mg total) by mouth daily.    Cyanocobalamin (B-12) 1000 MCG SUBL Place 1 tablet under the tongue daily. 06/20/2019: Not SL, takes tablet   cyclobenzaprine (FLEXERIL) 10 MG tablet TAKE 1/2 TO 1 TABLET BY MOUTH TWICE A DAY AS NEEDED FOR MUSCLE SPASMS(SEDATION PRECAUTIONS)    dapagliflozin propanediol (FARXIGA) 10 MG TABS tablet Take 1 tablet (10 mg total) by mouth daily before breakfast.    ferrous sulfate 325 (65 FE) MG tablet Take 1 tablet (325 mg total) by mouth daily with breakfast.    fluticasone (FLONASE) 50 MCG/ACT nasal spray SPRAY 2 SPRAYS INTO EACH NOSTRIL EVERY DAY    fluticasone-salmeterol (ADVAIR) 100-50 MCG/ACT  AEPB Inhale 1 puff into the lungs 2 (two) times daily.    furosemide (LASIX) 40 MG tablet TAKE 1 TABLET BY MOUTH TWICE A DAY    glimepiride (AMARYL) 4 MG tablet TAKE 1 TABLET BY MOUTH DAILY WITH BREAKFAST    hydrOXYzine (ATARAX/VISTARIL) 50 MG tablet TAKE 1 TABLET (50 MG TOTAL) BY MOUTH 2 (TWO) TIMES DAILY AS NEEDED FOR ANXIETY.    ipratropium (ATROVENT HFA) 17 MCG/ACT inhaler Inhale 2 puffs into the lungs every 6 (six) hours as needed for wheezing.    losartan (COZAAR) 25 MG tablet Take 1 tablet (25 mg total) by mouth daily.    metFORMIN (GLUCOPHAGE) 1000 MG tablet TAKE 1 TABLET (1,000 MG TOTAL) BY MOUTH 2 (TWO) TIMES DAILY WITH A MEAL.    montelukast (SINGULAIR) 10 MG tablet TAKE 1 TABLET BY MOUTH EVERYDAY AT BEDTIME    ONETOUCH VERIO test strip USE AS INSTRUCTED TO CHECK THREE TIMES DAILY AND AS NEEDED. E11.8    pantoprazole (PROTONIX) 40 MG tablet TAKE 1 TABLET (40 MG TOTAL) BY MOUTH 2 (TWO) TIMES DAILY BEFORE A MEAL.    Probiotic Product (PROBIOTIC DAILY PO) Take by mouth.    simvastatin (ZOCOR) 20 MG tablet TAKE 1 TABLET BY MOUTH EVERY DAY IN THE EVENING    umeclidinium bromide (INCRUSE ELLIPTA) 62.5 MCG/INH AEPB Inhale 1 puff into the lungs daily.    No facility-administered encounter medications on file as of 12/31/2020.  Recent Relevant Labs: Lab Results  Component Value Date/Time   HGBA1C 8.7 (H) 07/09/2020 07:57 AM   HGBA1C 8.2 (H) 04/14/2020 08:41 AM   HGBA1C 7.0 (H) 01/24/2013 04:07 AM   MICROALBUR <0.7 07/09/2020 08:22 AM   MICROALBUR 1.0 10/17/2017 11:35 AM    Kidney Function Lab Results  Component Value Date/Time   CREATININE 0.63 08/05/2020 01:10 PM   CREATININE 0.72 07/09/2020 07:57 AM   CREATININE 0.82 06/20/2013 11:39 AM   CREATININE 0.80 01/24/2013 04:07 AM   GFR 96.34 07/09/2020 07:57 AM   GFRNONAA >60 08/05/2020 01:10 PM   GFRNONAA >60 06/20/2013 11:39 AM   GFRAA >60 12/22/2018 09:28 AM   GFRAA >60 06/20/2013 11:39 AM    Contacted patient on 12/31/2020  to discuss diabetes disease state.   Current antihyperglycemic regimen:  Glimepiride 4 mg - 1 tablet daily with breakfast Metformin 1000 mg - 1 tablet twice daily with meals    Patient verbally confirms he is taking the above medications as directed. Yes  What diet changes have been made to improve diabetes control?The patient reports he is eating smaller portions and cutting back on sugar and sweets  What recent interventions/DTPs have been made to improve glycemic control:  Patient had cost concern on the Farxiga so it was discontinued at the March visit 2022.  Have there been any recent hospitalizations or ED visits since last visit with CPP? No  Patient denies hypoglycemic symptoms, including Pale, Sweaty, Shaky, Hungry, Nervous/irritable, and Vision changes  Patient denies hyperglycemic symptoms, including blurry vision, excessive thirst, fatigue, polyuria, and weakness  How often are you checking your blood sugar? The patient reports he has not been checking his BGs because he knows they are high  What are your blood sugars ranging?  Fasting:  averages low 200s   During the week, how often does your blood glucose drop below 70? Never - the patient states the lowest has been 99  Are you checking your feet daily/regularly? Yes  The patient reports that his legs have been swelling some and he noticed water oozing, some redness, from the legs after using the weed eater the other day. Today they are fine.He is taking Lasix 46m twice daily.  Adherence Review: Is the patient currently on a STATIN medication? Yes Is the patient currently on ACE/ARB medication? Yes Does the patient have >5 day gap between last estimated fill dates? No  Care Gaps: Last annual wellness visit:07/16/20 Last eye exam / retinopathy screening:06/17/20 Last diabetic foot exam:06/17/20  Counseled patient on importance of annual eye and foot exam.   Star Rating Drugs:  Medication:  Last Fill: Day  Supply Metformin 1000 mg     10/26/20            90 Losartan 25 mg           11/17/20            90 Glimepiride 4 mg         11/23/20            90 Simvastatin 25m7/21/22  90  No appointments scheduled within the next 30 days.  MiDebbora DusCPP notified  VeAvel SensorCCBon Hommessistant [phone] have reviewed the care management and care coordination activities outlined in this encounter and I am certifying that I agree with the content of this note. Rescheduled CCM, pt missed appt July 2022.  MiDebbora DusPharmD Clinical Pharmacist [phone]

## 2021-01-28 ENCOUNTER — Telehealth: Payer: Self-pay | Admitting: Family Medicine

## 2021-01-28 NOTE — Telephone Encounter (Signed)
Received notice from his insurance that his BPs have been elevated and O2 sats have  been low. Reviewing log that insurance provides, SBP 106-151, averaging 120-130, O2 sat 90-94, one low of 88%. I think overall ok readings.  Plz call pt - how is he feeling? If doing well, we can just continue monitoring. If he notes symptomatic low o2, will recommend OV for ambulatory pulse ox measurement.

## 2021-01-28 NOTE — Telephone Encounter (Signed)
Left message on voicemail for patient to call the office back. 

## 2021-01-29 NOTE — Telephone Encounter (Signed)
Pt notified as instructed and voiced understanding. Pt said "I feel better than I have felt in long time". Pt will cb if condition changes or pt has concerns that pt would need to be seen. Sending note back to Dr Darnell Level.

## 2021-02-06 ENCOUNTER — Other Ambulatory Visit: Payer: Self-pay | Admitting: Family Medicine

## 2021-02-06 NOTE — Telephone Encounter (Signed)
Lvm for pt to call and schedule AWV/Lab

## 2021-02-06 NOTE — Telephone Encounter (Signed)
E-scribed refill.  Plz schedule wellness, lab and cpe visits.  

## 2021-02-09 NOTE — Telephone Encounter (Signed)
PLEASE NOTE: All timestamps contained within this report are represented as [place] Standard Time. CONFIDENTIALTY NOTICE: This fax transmission is intended only for the addressee. It contains information that is legally privileged, confidential or otherwise protected from use or disclosure. If you are not the intended recipient, you are strictly prohibited from reviewing, disclosing, copying using or disseminating any of this information or taking any action in reliance on or regarding this information. If you have received this fax in error, please notify us immediately by telephone so that we can arrange for its return to [place]. Phone: [phone], Toll-Free: [phone], Fax: [phone] Page: 1 of 1 Call Id: [id number] [place] Night - Client Nonclinical Telephone Record  [facility] Client [place] Night - Client Client Site [place] Physician [name] - MD Contact Type Call Who Is Calling Patient / Member / Family / Caregiver Caller Name [place] Phone Number [phone] Call Type Message Only Information Provided Reason for Call Returning a Call from the Office Initial Comment Returning call to the office to make appt, pls call back. Disp. Time Disposition Final User 02/07/2021 2:26:06 PM General Information Provided Yes Donato Heinz Call Closed By: Donato Heinz Transaction Date/Time: 02/07/2021 2:24:31 PM (ET)

## 2021-02-09 NOTE — Telephone Encounter (Signed)
Lvm to call office to schedule a AWV/lab

## 2021-02-14 ENCOUNTER — Other Ambulatory Visit: Payer: Self-pay | Admitting: Family Medicine

## 2021-02-19 ENCOUNTER — Other Ambulatory Visit: Payer: Self-pay | Admitting: Family Medicine

## 2021-02-24 ENCOUNTER — Telehealth: Payer: Self-pay | Admitting: Family Medicine

## 2021-02-24 ENCOUNTER — Telehealth: Payer: Self-pay

## 2021-02-24 ENCOUNTER — Telehealth: Payer: Medicare Other

## 2021-02-24 DIAGNOSIS — I5022 Chronic systolic (congestive) heart failure: Secondary | ICD-10-CM

## 2021-02-24 DIAGNOSIS — E1169 Type 2 diabetes mellitus with other specified complication: Secondary | ICD-10-CM

## 2021-02-24 NOTE — Telephone Encounter (Signed)
Chronic Care Management Pharmacy Note  02/24/2021 Name:  [name] MRN:  [id number] DOB:  07/19/1955  Summary: - Attempted to contact x 2 for follow up visit today, chart reviewed   Recommendations/Changes made from today's visit: - Would patient be interested in PAP for [place]?  Plan: -Call patient this month to reschedule and completed COPD assessment   Subjective: [name] is an 65 y.o. year old male who is a primary patient of [name], MD.  The CCM team was consulted for assistance with disease management and care coordination needs.    Unable to reach patient  for follow up visit in response to provider referral for pharmacy case management and/or care coordination services.   Consent to Services:  The patient was given information about Chronic Care Management services, agreed to services, and gave verbal consent prior to initiation of services.  Please see initial visit note for detailed documentation.   Patient Care Team: [name], MD as PCP - General (Family Medicine) Thompson Grayer, MD as Attending Physician (Cardiology) Corey Skains, MD as Referring Physician (Internal Medicine) Debbora Dus, [facility] as Pharmacist (Pharmacist)  Recent office visits:  None since last CCM contact   Recent consult visits:  None since last CCM contact   Hospital visits:  08/05/20 ED Visit for COPD exacerbation. No admission.    Objective:  Lab Results  Component Value Date   CREATININE 0.63 08/05/2020   BUN 10 08/05/2020   GFR 96.34 07/09/2020   GFRNONAA >60 08/05/2020   GFRAA >60 12/22/2018   NA 140 08/05/2020   K 4.7 08/05/2020   CALCIUM 9.6 08/05/2020   CO2 32 08/05/2020   GLUCOSE 181 (H) 08/05/2020    Lab Results  Component Value Date/Time   HGBA1C 8.7 (H) 07/09/2020 07:57 AM   HGBA1C 8.2 (H) 04/14/2020 08:41 AM   HGBA1C 7.0 (H) 01/24/2013 04:07 AM   GFR 96.34 07/09/2020 07:57 AM   GFR 101.45 04/14/2020 08:41 AM    MICROALBUR <0.7 07/09/2020 08:22 AM   MICROALBUR 1.0 10/17/2017 11:35 AM    Last diabetic Eye exam:  Lab Results  Component Value Date/Time   HMDIABEYEEXA No Retinopathy 06/17/2020 12:00 AM    Last diabetic Foot exam: 06/04/2016   Lab Results  Component Value Date   CHOL 185 07/09/2020   HDL 36.70 (L) 07/09/2020   LDLCALC 90 09/07/2013   LDLDIRECT 97.0 07/09/2020   TRIG (H) 07/09/2020    451.0 Triglyceride is over 400; calculations on Lipids are invalid.   CHOLHDL 5 07/09/2020    Hepatic Function Latest Ref Rng & Units 07/09/2020 02/08/2020 01/04/2020  Total Protein 6.0 - 8.3 g/dL 6.6 7.1 6.9  Albumin 3.5 - 5.2 g/dL 4.1 4.0 4.6  AST 0 - 37 U/L 24 28 38(H)  ALT 0 - 53 U/L _0 Alk Phosphatase 39 - 117 U/L 66 51 64  Total Bilirubin 0.2 - 1.2 mg/dL 0.4 0.6 0.5    Lab Results  Component Value Date/Time   TSH 2.63 10/02/2019 12:05 PM   TSH 2.37 06/22/2016 11:33 AM    CBC Latest Ref Rng & Units 08/05/2020 06/24/2020 02/08/2020  WBC 4.0 - 10.5 K/uL 8.2 8.7 9.5  Hemoglobin 13.0 - 17.0 g/dL 14.2 12.2(L) 13.1  Hematocrit 39.0 - 52.0 % 47.3 39.5 41.6  Platelets 150 - 400 K/uL 242 224.0 224    Lab Results  Component Value Date/Time   VD25OH 29.46 (L) 07/09/2020 07:57 AM   VD25OH 27.47 (L)  10/02/2019 02:55 PM    Clinical ASCVD: Yes  The 10-year ASCVD risk score (Arnett DK, et al., 2019) is: 32.4%   Values used to calculate the score:     Age: 29 years     Sex: Male     Is Non-Hispanic African American: No     Diabetic: Yes     Tobacco smoker: No     Systolic Blood Pressure: 469 mmHg     Is BP treated: Yes     HDL Cholesterol: 36.7 mg/dL     Total Cholesterol: 185 mg/dL    Depression screen [facility] 2/9 07/09/2020 01/05/2018 10/17/2017  Decreased Interest 0 0 0  Down, Depressed, Hopeless 0 0 0  PHQ - 2 Score 0 0 0  Altered sleeping 0 - -  Tired, decreased energy 0 - -  Change in appetite 0 - -  Feeling bad or failure about yourself  0 - -  Trouble concentrating 0 - -   Moving slowly or fidgety/restless 0 - -  Suicidal thoughts 0 - -  PHQ-9 Score 0 - -  Difficult doing work/chores Not difficult at all - -  Some recent data might be hidden    Social History   Tobacco Use  Smoking Status Former   Packs/day: 1.00   Years: 31.00   Pack years: 31.00   Types: Cigarettes   Quit date: 04/26/2018   Years since quitting: 2.8  Smokeless Tobacco Never   BP Readings from Last 3 Encounters:  08/13/20 136/74  08/05/20 (!) 145/98  08/05/20 136/82   Pulse Readings from Last 3 Encounters:  08/13/20 88  08/05/20 70  08/05/20 79   Wt Readings from Last 3 Encounters:  08/13/20 277 lb 5 oz (125.8 kg)  08/05/20 276 lb (125.2 kg)  08/05/20 275 lb 2 oz (124.8 kg)   BMI Readings from Last 3 Encounters:  08/13/20 37.61 kg/m  08/05/20 37.43 kg/m  08/05/20 38.37 kg/m    Assessment/Interventions: Review of patient past medical history, allergies, medications, health status, including review of consultants reports, laboratory and other test data, was performed as part of comprehensive evaluation and provision of chronic care management services.   SDOH:  (Social Determinants of Health) assessments and interventions performed: No  SDOH Screenings   Alcohol Screen: Low Risk    Last Alcohol Screening Score (AUDIT): 0  Depression (PHQ2-9): Low Risk    PHQ-2 Score: 0  Financial Resource Strain: Low Risk    Difficulty of Paying Living Expenses: Not hard at all  Food Insecurity: No Food Insecurity   Worried About Charity fundraiser in the Last Year: Never true   Ran Out of Food in the Last Year: Never true  Housing: Low Risk    Last Housing Risk Score: 0  Physical Activity: Inactive   Days of Exercise per Week: 0 days   Minutes of Exercise per Session: 0 min  Social Connections: Not on file  Stress: No Stress Concern Present   Feeling of Stress : Not at all  Tobacco Use: Medium Risk   Smoking Tobacco Use: Former   Smokeless Tobacco Use: Never    Passive Exposure: Not on file  Transportation Needs: No Transportation Needs   Lack of Transportation (Medical): No   Lack of Transportation (Non-Medical): No    CCM Care Plan  Allergies  Allergen Reactions   Atorvastatin Other (See Comments)    Dizziness   Lisinopril Cough    Medications Reviewed Today     Reviewed  by [name], MD (Physician) on 08/13/20 at 1006  Med List Status: <None>   Medication Order Taking? Sig Documenting Provider Last Dose Status Informant  ACCU-CHEK FASTCLIX LANCETS [place] [id number] Yes Check blood sugar once daily and as instructed. Dx 250.00 [name], MD Taking Active   albuterol (PROVENTIL) (2.5 MG/3ML) 0.083% nebulizer solution [id number] Yes Take 3 mLs (2.5 mg total) by nebulization every 6 (six) hours as needed for wheezing or shortness of breath. [name], MD Taking Active   albuterol (VENTOLIN HFA) 108 [phone] Base) MCG/ACT inhaler [id number] Yes INHALE 2 PUFFS BY MOUTH EVERY 6 HOURS AS NEEDED FOR WHEEZE OR SHORTNESS OF Alvester Morin, MD Taking Active   aspirin EC 81 MG tablet [id number] Yes Take 81 mg by mouth daily. [provider] Taking Active   Blood Glucose Monitoring Suppl (BLOOD GLUCOSE MONITOR SYSTEM) W/DEVICE KIT [id number] Yes by Does not apply route. Use to check sugar once daily and as needed Dx: E11.9 **ONE TOUCH VERIO** [provider] Taking Active   carvedilol (COREG) 6.25 MG tablet [id number] Yes TAKE 1 TABLET BY MOUTH 2 TIMES DAILY WITH A MEAL. [name], MD Taking Active   Cholecalciferol (VITAMIN D) 50 MCG (2000 UT) CAPS [id number] Yes Take 1 capsule (2,000 Units total) by mouth daily. [name], MD Taking Active   citalopram (CELEXA) 10 MG tablet [id number] Yes Take 1 tablet (10 mg total) by mouth daily. [name], MD Taking Active   Cyanocobalamin (B-12) 1000 MCG SUBL [id number] Yes Place 1 tablet under the tongue daily. [name], MD Taking Active             Med Note Andree Elk, [facility]   Wed Jun 20, 2019  9:24 AM) Not SL, takes tablet  cyclobenzaprine (FLEXERIL) 10 MG tablet [id number] Yes Take 0.5-1 tablets (5-10 mg total) by mouth 2 (two) times daily as needed for muscle spasms (sedation precautions). [name], MD Taking Active   dapagliflozin propanediol (FARXIGA) 10 MG TABS tablet [id number] Yes Take 1 tablet (10 mg total) by mouth daily before breakfast. [name], MD Taking Active   ferrous sulfate 325 (65 FE) MG tablet [id number] Yes Take 1 tablet (325 mg total) by mouth daily with breakfast. [name], MD Taking Active   fluticasone [facility]) 50 MCG/ACT nasal spray [id number] Yes SPRAY 2 SPRAYS INTO EACH NOSTRIL EVERY Velora Heckler, MD Taking Active   furosemide (LASIX) 40 MG tablet [id number] Yes TAKE 1 TABLET BY MOUTH TWICE A Velora Heckler, MD Taking Active   glimepiride (AMARYL) 4 MG tablet [id number] Yes Take 1 tablet (4 mg total) by mouth daily with breakfast. [name], MD Taking Active   hydrOXYzine (ATARAX/VISTARIL) 50 MG tablet [id number] Yes TAKE 1 TABLET (50 MG TOTAL) BY MOUTH 2 (TWO) TIMES DAILY AS NEEDED FOR ANXIETY. [name], MD Taking Active   isosorbide mononitrate (IMDUR) 30 MG 24 hr tablet [id number] Yes Take by mouth. [provider] Taking Active   metFORMIN (GLUCOPHAGE) 1000 MG tablet [id number] Yes TAKE 1 TABLET (1,000 MG TOTAL) BY MOUTH 2 (TWO) TIMES DAILY WITH A MEAL. [name], MD Taking Active   montelukast (SINGULAIR) 10 MG tablet [id number] Yes TAKE 1 TABLET BY MOUTH EVERYDAY AT BEDTIME [name], MD Taking Active   [facility] VERIO test strip [id number] Yes USE AS INSTRUCTED TO CHECK THREE TIMES DAILY AND AS NEEDED. E11.8 [name], MD Taking Active   pantoprazole (PROTONIX) 40 MG tablet [id number] Yes Take 1 tablet (40 mg total) by mouth daily. Danise Mina,  Garlon Hatchet, MD Taking Active   Probiotic Product (PROBIOTIC DAILY PO) [id number] Yes Take by  mouth. [provider] Taking Active   simvastatin (ZOCOR) 20 MG tablet [id number] Yes TAKE 1 TABLET BY MOUTH EVERY DAY IN THE Recardo Evangelist, MD Taking Active   umeclidinium bromide (INCRUSE ELLIPTA) 62.5 MCG/INH AEPB [id number] Yes Inhale 1 puff into the lungs daily. [name], MD Taking Active   Med List Note Chauncey Fischer, RN 01/05/18 1237): UDS 01/05/18            Patient Active Problem List   Diagnosis Date Noted   COPD exacerbation ([place]) 08/05/2020   Vitreous floaters of left eye 06/05/2020   PLMD (periodic limb movement disorder) 06/05/2020   Ulceration of intestine    Neck pain on left side 02/25/2020   Mediastinal mass 01/05/2020   Skin rash 10/12/2019   Vitamin D deficiency 04/19/2019   Low serum vitamin B12 04/19/2019   Iron deficiency anemia 01/02/2019   Personal history of tobacco use, presenting hazards to health 11/07/2018   Epigastric abdominal pain 09/19/2018   COPD (chronic obstructive pulmonary disease) ([place]) 07/25/2018   Chronic neck pain (Tertiary Area of Pain) (R>L) 01/05/2018   Chronic upper extremity pain (Fourth Area of Pain) (R>L) 01/05/2018   Chronic upper back pain 01/05/2018   Chronic bilateral low back pain without sciatica 01/05/2018   Chronic pain syndrome 01/05/2018   Right temporal headache 10/17/2017   Right leg weakness 07/14/2017   AAA (abdominal aortic aneurysm) without rupture 09/15/2016   Adrenal adenoma, left 08/13/2016   Nodule of right lung 08/13/2016   Fatty liver 08/01/2016   Right flank pain 07/26/2016   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[place]) 07/26/2016   Right shoulder pain 07/26/2016   Restrictive lung disease 78/24/2353   Chronic systolic CHF (congestive heart failure) ([place])    Tinea pedis 06/30/2015   Gassiness 02/21/2015   Advanced care planning/counseling discussion 10/16/2014   Encounter for general adult medical examination with abnormal findings 10/16/2014   Diabetes mellitus type 2, uncontrolled,  with complications 61/44/3154   Medicare annual wellness visit, subsequent 00/86/7619   Umbilical hernia without obstruction and without gangrene 09/03/2013   Left flank mass 09/03/2013   Hyperlipidemia associated with type 2 diabetes mellitus ([place]) 06/29/2013   Coronary artery disease 06/29/2013   Abnormal toxicological findings 06/24/2013   Exertional dyspnea 06/14/2013   Malaise and fatigue 03/14/2013   Hypertension    Panic attacks    History of atrial flutter 07/07/2011   Severe obesity (BMI 35.0-39.9) with comorbidity ([place]) 07/07/2011   Ex-smoker 08/13/2009   OSA (obstructive sleep apnea) 08/13/2009   DDD (degenerative disc disease), cervical 08/13/2009   Allergic rhinitis 06/24/2009    Immunization History  Administered Date(s) Administered   Influenza,inj,Quad PF,6+ Mos 05/29/2014, 02/21/2015, 02/22/2017, 03/16/2018, 01/02/2019, 01/04/2020   Influenza-Unspecified 01/24/2013   PFIZER(Purple Top)SARS-COV-2 Vaccination 07/12/2019, 08/02/2019, 03/19/2020   Pneumococcal Polysaccharide-23 02/24/2013   Td 04/26/2006    Conditions to be addressed/monitored:  Diabetes and COPD   Current Barriers:  Unable to assess  Pharmacist Clinical Goal(s):  Patient will contact provider office for questions/concerns as evidenced notation of same in electronic health record through collaboration with PharmD and provider.   Interventions: 1:1 collaboration with [name], MD regarding development and update of comprehensive plan of care as evidenced by provider attestation and co-signature Inter-disciplinary care team collaboration (see longitudinal plan of care) Comprehensive medication review performed; medication list updated in electronic medical record  Diabetes (A1c goal <7%) -Uncontrolled -Current  medications: Farxiga 10 mg - 1 tablet daily (not taking) Glimepiride 4 mg - 1 tablet daily Metformin 1000 mg - 1 tablet twice daily  -Medications previously tried: none    -Current home glucose readings - unable to reach pt to discuss fasting glucose: n/a post prandial glucose: n/a -Unable to reach to discuss hypoglycemic/hyperglycemic symptoms -Current meal patterns: n/a -Current exercise: n/a -Annual foot and eye exams - needs evaluation  -Recommended reschedule visit today  COPD (Goal: control symptoms and prevent exacerbations) -Controlled - unable to assess today -Current treatment  Wixela (per refill history) -Medications previously tried: not discussed (Incruse Ellipta, Advair)  -Exacerbations requiring treatment in last 6 months: yes  -Unable to discuss - consistent use of maintenance inhaler -Frequency of rescue inhaler use: unable to discuss -Recommended reschedule visit  Patient Goals/Self-Care Activities Patient will:  -  none identified - need to RS visit  Follow Up Plan: The care management team will reach out to the patient again over the next 30 days.   Medication Assistance:  Wilder Glade stopped due to cost - may need to restart.  Compliance/Adherence/Medication fill history: Care Gaps: Diabetic foot exam A1c   Star-Rating Drugs: Medication:                Last Fill:         Day Supply Metformin 1000 mg     01/21/21            90 Losartan 25 mg           11/17/20            90 Glimepiride 4 mg         11/23/20            90 Simvastatin 37m       11/13/20             90  Patient's preferred pharmacy is:  [id number] [place], [id number]: [phone]: [phone] MiDebbora DusPharmD Clinical Pharmacist [phone]

## 2021-02-24 NOTE — Progress Notes (Deleted)
Chronic Care Management Pharmacy Note  02/24/2021 Name:  ISSAIC WELLIVER MRN:  [id number] DOB:  12/20/1955  Summary: ***  Recommendations/Changes made from today's visit: ***  Plan: ***   Subjective: RUDOLPH DAOUST is an 65 y.o. year old male who is a primary patient of [name], MD.  The CCM team was consulted for assistance with disease management and care coordination needs.    Engaged with patient by telephone for follow up visit in response to provider referral for pharmacy case management and/or care coordination services.   Consent to Services:  The patient was given information about Chronic Care Management services, agreed to services, and gave verbal consent prior to initiation of services.  Please see initial visit note for detailed documentation.   Patient Care Team: [name], MD as PCP - General (Family Medicine) Thompson Grayer, MD as Attending Physician (Cardiology) Corey Skains, MD as Referring Physician (Internal Medicine) Debbora Dus, [facility] as Pharmacist (Pharmacist)  Recent office visits:  None since last CCM contact   Recent consult visits:  None since last CCM contact   Hospital visits:  08/05/20 ED Visit for COPD exacerbation. No admission.    Objective:  Lab Results  Component Value Date   CREATININE 0.63 08/05/2020   BUN 10 08/05/2020   GFR 96.34 07/09/2020   GFRNONAA >60 08/05/2020   GFRAA >60 12/22/2018   NA 140 08/05/2020   K 4.7 08/05/2020   CALCIUM 9.6 08/05/2020   CO2 32 08/05/2020   GLUCOSE 181 (H) 08/05/2020    Lab Results  Component Value Date/Time   HGBA1C 8.7 (H) 07/09/2020 07:57 AM   HGBA1C 8.2 (H) 04/14/2020 08:41 AM   HGBA1C 7.0 (H) 01/24/2013 04:07 AM   GFR 96.34 07/09/2020 07:57 AM   GFR 101.45 04/14/2020 08:41 AM   MICROALBUR <0.7 07/09/2020 08:22 AM   MICROALBUR 1.0 10/17/2017 11:35 AM    Last diabetic Eye exam:  Lab Results  Component Value Date/Time   HMDIABEYEEXA No  Retinopathy 06/17/2020 12:00 AM    Last diabetic Foot exam: 06/04/2016   Lab Results  Component Value Date   CHOL 185 07/09/2020   HDL 36.70 (L) 07/09/2020   LDLCALC 90 09/07/2013   LDLDIRECT 97.0 07/09/2020   TRIG (H) 07/09/2020    451.0 Triglyceride is over 400; calculations on Lipids are invalid.   CHOLHDL 5 07/09/2020    Hepatic Function Latest Ref Rng & Units 07/09/2020 02/08/2020 01/04/2020  Total Protein 6.0 - 8.3 g/dL 6.6 7.1 6.9  Albumin 3.5 - 5.2 g/dL 4.1 4.0 4.6  AST 0 - 37 U/L 24 28 38(H)  ALT 0 - 53 U/L _0 Alk Phosphatase 39 - 117 U/L 66 51 64  Total Bilirubin 0.2 - 1.2 mg/dL 0.4 0.6 0.5    Lab Results  Component Value Date/Time   TSH 2.63 10/02/2019 12:05 PM   TSH 2.37 06/22/2016 11:33 AM    CBC Latest Ref Rng & Units 08/05/2020 06/24/2020 02/08/2020  WBC 4.0 - 10.5 K/uL 8.2 8.7 9.5  Hemoglobin 13.0 - 17.0 g/dL 14.2 12.2(L) 13.1  Hematocrit 39.0 - 52.0 % 47.3 39.5 41.6  Platelets 150 - 400 K/uL 242 224.0 224    Lab Results  Component Value Date/Time   VD25OH 29.46 (L) 07/09/2020 07:57 AM   VD25OH 27.47 (L) 10/02/2019 02:55 PM    Clinical ASCVD: Yes  The 10-year ASCVD risk score (Arnett DK, et al., 2019) is: 32.4%   Values used to calculate the score:  Age: 85 years     Sex: Male     Is Non-Hispanic African American: No     Diabetic: Yes     Tobacco smoker: No     Systolic Blood Pressure: 035 mmHg     Is BP treated: Yes     HDL Cholesterol: 36.7 mg/dL     Total Cholesterol: 185 mg/dL    Depression screen [facility] 2/9 07/09/2020 01/05/2018 10/17/2017  Decreased Interest 0 0 0  Down, Depressed, Hopeless 0 0 0  PHQ - 2 Score 0 0 0  Altered sleeping 0 - -  Tired, decreased energy 0 - -  Change in appetite 0 - -  Feeling bad or failure about yourself  0 - -  Trouble concentrating 0 - -  Moving slowly or fidgety/restless 0 - -  Suicidal thoughts 0 - -  PHQ-9 Score 0 - -  Difficult doing work/chores Not difficult at all - -  Some recent data might  be hidden    Social History   Tobacco Use  Smoking Status Former   Packs/day: 1.00   Years: 31.00   Pack years: 31.00   Types: Cigarettes   Quit date: 04/26/2018   Years since quitting: 2.8  Smokeless Tobacco Never   BP Readings from Last 3 Encounters:  08/13/20 136/74  08/05/20 (!) 145/98  08/05/20 136/82   Pulse Readings from Last 3 Encounters:  08/13/20 88  08/05/20 70  08/05/20 79   Wt Readings from Last 3 Encounters:  08/13/20 277 lb 5 oz (125.8 kg)  08/05/20 276 lb (125.2 kg)  08/05/20 275 lb 2 oz (124.8 kg)   BMI Readings from Last 3 Encounters:  08/13/20 37.61 kg/m  08/05/20 37.43 kg/m  08/05/20 38.37 kg/m    Assessment/Interventions: Review of patient past medical history, allergies, medications, health status, including review of consultants reports, laboratory and other test data, was performed as part of comprehensive evaluation and provision of chronic care management services.   SDOH:  (Social Determinants of Health) assessments and interventions performed: Yes  SDOH Screenings   Alcohol Screen: Low Risk    Last Alcohol Screening Score (AUDIT): 0  Depression (PHQ2-9): Low Risk    PHQ-2 Score: 0  Financial Resource Strain: Low Risk    Difficulty of Paying Living Expenses: Not hard at all  Food Insecurity: No Food Insecurity   Worried About Charity fundraiser in the Last Year: Never true   Ran Out of Food in the Last Year: Never true  Housing: Low Risk    Last Housing Risk Score: 0  Physical Activity: Inactive   Days of Exercise per Week: 0 days   Minutes of Exercise per Session: 0 min  Social Connections: Not on file  Stress: No Stress Concern Present   Feeling of Stress : Not at all  Tobacco Use: Medium Risk   Smoking Tobacco Use: Former   Smokeless Tobacco Use: Never   Passive Exposure: Not on file  Transportation Needs: No Transportation Needs   Lack of Transportation (Medical): No   Lack of Transportation (Non-Medical): No    CCM  Care Plan  Allergies  Allergen Reactions   Atorvastatin Other (See Comments)    Dizziness   Lisinopril Cough    Medications Reviewed Today     Reviewed by [name], MD (Physician) on 08/13/20 at [place] List Status: <None>   Medication Order Taking? Sig Documenting Provider Last Dose Status Informant  ACCU-CHEK FASTCLIX LANCETS [place] [id number] Yes Check  blood sugar once daily and as instructed. Dx 250.00 [name], MD Taking Active   albuterol (PROVENTIL) (2.5 MG/3ML) 0.083% nebulizer solution [id number] Yes Take 3 mLs (2.5 mg total) by nebulization every 6 (six) hours as needed for wheezing or shortness of breath. [name], MD Taking Active   albuterol (VENTOLIN HFA) 108 [phone] Base) MCG/ACT inhaler [id number] Yes INHALE 2 PUFFS BY MOUTH EVERY 6 HOURS AS NEEDED FOR WHEEZE OR SHORTNESS OF Alvester Morin, MD Taking Active   aspirin EC 81 MG tablet [id number] Yes Take 81 mg by mouth daily. [provider] Taking Active   Blood Glucose Monitoring Suppl (BLOOD GLUCOSE MONITOR SYSTEM) W/DEVICE KIT [id number] Yes by Does not apply route. Use to check sugar once daily and as needed Dx: E11.9 **ONE TOUCH VERIO** [provider] Taking Active   carvedilol (COREG) 6.25 MG tablet [id number] Yes TAKE 1 TABLET BY MOUTH 2 TIMES DAILY WITH A MEAL. [name], MD Taking Active   Cholecalciferol (VITAMIN D) 50 MCG (2000 UT) CAPS [id number] Yes Take 1 capsule (2,000 Units total) by mouth daily. [name], MD Taking Active   citalopram (CELEXA) 10 MG tablet [id number] Yes Take 1 tablet (10 mg total) by mouth daily. [name], MD Taking Active   Cyanocobalamin (B-12) 1000 MCG SUBL [id number] Yes Place 1 tablet under the tongue daily. [name], MD Taking Active            Med Note Andree Elk, [facility]   Wed Jun 20, 2019  9:24 AM) Not SL, takes tablet  cyclobenzaprine (FLEXERIL) 10 MG tablet [id number] Yes Take 0.5-1 tablets (5-10 mg total) by  mouth 2 (two) times daily as needed for muscle spasms (sedation precautions). [name], MD Taking Active   dapagliflozin propanediol (FARXIGA) 10 MG TABS tablet [id number] Yes Take 1 tablet (10 mg total) by mouth daily before breakfast. [name], MD Taking Active   ferrous sulfate 325 (65 FE) MG tablet [id number] Yes Take 1 tablet (325 mg total) by mouth daily with breakfast. [name], MD Taking Active   fluticasone [facility]) 50 MCG/ACT nasal spray [id number] Yes SPRAY 2 SPRAYS INTO EACH NOSTRIL EVERY Velora Heckler, MD Taking Active   furosemide (LASIX) 40 MG tablet [id number] Yes TAKE 1 TABLET BY MOUTH TWICE A Velora Heckler, MD Taking Active   glimepiride (AMARYL) 4 MG tablet [id number] Yes Take 1 tablet (4 mg total) by mouth daily with breakfast. [name], MD Taking Active   hydrOXYzine (ATARAX/VISTARIL) 50 MG tablet [id number] Yes TAKE 1 TABLET (50 MG TOTAL) BY MOUTH 2 (TWO) TIMES DAILY AS NEEDED FOR ANXIETY. [name], MD Taking Active   isosorbide mononitrate (IMDUR) 30 MG 24 hr tablet [id number] Yes Take by mouth. [provider] Taking Active   metFORMIN (GLUCOPHAGE) 1000 MG tablet [id number] Yes TAKE 1 TABLET (1,000 MG TOTAL) BY MOUTH 2 (TWO) TIMES DAILY WITH A MEAL. [name], MD Taking Active   montelukast (SINGULAIR) 10 MG tablet [id number] Yes TAKE 1 TABLET BY MOUTH EVERYDAY AT BEDTIME [name], MD Taking Active   [facility] VERIO test strip [id number] Yes USE AS INSTRUCTED TO CHECK THREE TIMES DAILY AND AS NEEDED. E11.8 [name], MD Taking Active   pantoprazole (PROTONIX) 40 MG tablet [id number] Yes Take 1 tablet (40 mg total) by mouth daily. [name], MD Taking Active   Probiotic Product (PROBIOTIC DAILY PO) [id number] Yes Take by mouth. [provider] Taking Active   simvastatin (ZOCOR) 20 MG tablet [id number] Yes TAKE 1 TABLET BY  MOUTH EVERY DAY IN THE Recardo Evangelist, MD Taking  Active   umeclidinium bromide (INCRUSE ELLIPTA) 62.5 MCG/INH AEPB [id number] Yes Inhale 1 puff into the lungs daily. [name], MD Taking Active   Med List Note Chauncey Fischer, RN 01/05/18 1237): UDS 01/05/18            Patient Active Problem List   Diagnosis Date Noted   COPD exacerbation ([place]) 08/05/2020   Vitreous floaters of left eye 06/05/2020   PLMD (periodic limb movement disorder) 06/05/2020   Ulceration of intestine    Neck pain on left side 02/25/2020   Mediastinal mass 01/05/2020   Skin rash 10/12/2019   Vitamin D deficiency 04/19/2019   Low serum vitamin B12 04/19/2019   Iron deficiency anemia 01/02/2019   Personal history of tobacco use, presenting hazards to health 11/07/2018   Epigastric abdominal pain 09/19/2018   COPD (chronic obstructive pulmonary disease) ([place]) 07/25/2018   Chronic neck pain (Tertiary Area of Pain) (R>L) 01/05/2018   Chronic upper extremity pain (Fourth Area of Pain) (R>L) 01/05/2018   Chronic upper back pain 01/05/2018   Chronic bilateral low back pain without sciatica 01/05/2018   Chronic pain syndrome 01/05/2018   Right temporal headache 10/17/2017   Right leg weakness 07/14/2017   AAA (abdominal aortic aneurysm) without rupture 09/15/2016   Adrenal adenoma, left 08/13/2016   Nodule of right lung 08/13/2016   Fatty liver 08/01/2016   Right flank pain 07/26/2016   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[place]) 07/26/2016   Right shoulder pain 07/26/2016   Restrictive lung disease 32/95/1884   Chronic systolic CHF (congestive heart failure) ([place])    Tinea pedis 06/30/2015   Gassiness 02/21/2015   Advanced care planning/counseling discussion 10/16/2014   Encounter for general adult medical examination with abnormal findings 10/16/2014   Diabetes mellitus type 2, uncontrolled, with complications 16/60/6301   Medicare annual wellness visit, subsequent 60/01/9322   Umbilical hernia without obstruction and without gangrene 09/03/2013   Left flank  mass 09/03/2013   Hyperlipidemia associated with type 2 diabetes mellitus ([place]) 06/29/2013   Coronary artery disease 06/29/2013   Abnormal toxicological findings 06/24/2013   Exertional dyspnea 06/14/2013   Malaise and fatigue 03/14/2013   Hypertension    Panic attacks    History of atrial flutter 07/07/2011   Severe obesity (BMI 35.0-39.9) with comorbidity ([place]) 07/07/2011   Ex-smoker 08/13/2009   OSA (obstructive sleep apnea) 08/13/2009   DDD (degenerative disc disease), cervical 08/13/2009   Allergic rhinitis 06/24/2009    Immunization History  Administered Date(s) Administered   Influenza,inj,Quad PF,6+ Mos 05/29/2014, 02/21/2015, 02/22/2017, 03/16/2018, 01/02/2019, 01/04/2020   Influenza-Unspecified 01/24/2013   PFIZER(Purple Top)SARS-COV-2 Vaccination 07/12/2019, 08/02/2019, 03/19/2020   Pneumococcal Polysaccharide-23 02/24/2013   Td 04/26/2006    Conditions to be addressed/monitored:  Diabetes and COPD  There are no care plans that you recently modified to display for this patient.   Current Barriers:  {pharmacybarriers:24917}  Pharmacist Clinical Goal(s):  Patient will {PHARMACYGOALCHOICES:24921} through collaboration with PharmD and provider.   Interventions: 1:1 collaboration with [name], MD regarding development and update of comprehensive plan of care as evidenced by provider attestation and co-signature Inter-disciplinary care team collaboration (see longitudinal plan of care) Comprehensive medication review performed; medication list updated in electronic medical record  Diabetes (A1c goal <7%) -Uncontrolled -Current medications: Farxiga 10 mg - 1 tablet daily (not taking) Glimepiride 4 mg - 1 tablet daily Metformin 1000 mg - 1 tablet twice daily  -Medications previously tried: ***  -Current home glucose  readings fasting glucose: *** post prandial glucose: *** -{ACTIONS;DENIES/REPORTS:21021675::"Denies"} hypoglycemic/hyperglycemic  symptoms -Current meal patterns:  breakfast: ***  lunch: ***  dinner: *** snacks: *** drinks: *** -Current exercise: *** -Educated on {CCM DM COUNSELING:25123} -Counseled to check feet daily and get yearly eye exams -{CCMPHARMDINTERVENTION:25122}  COPD (Goal: control symptoms and prevent exacerbations) -{US controlled/uncontrolled:25276} -Current treatment  Incruse Ellipta 62.5 mcg/inh - 1 puff into lungs daily Advair 100-50 mcg - 1 puff BID -Medications previously tried: ***  -Gold Grade: {CHL HP Upstream Pharm COPD Gold JQBHA:[id number]} -Current COPD Classification:  {CHL HP Upstream Pharm COPD Classification:272 188 2612} -MMRC/CAT score: *** -Pulmonary function testing: *** -Exacerbations requiring treatment in last 6 months: *** -Patient {Actions; denies-reports:120008} consistent use of maintenance inhaler -Frequency of rescue inhaler use: *** -Counseled on {CCMINHALERCOUNSELING:25121} -{CCMPHARMDINTERVENTION:25122}  Patient Goals/Self-Care Activities Patient will:  - {pharmacypatientgoals:24919}  Follow Up Plan: {CM FOLLOW UP BDZH:29924}  Medication Assistance: {MEDASSISTANCEINFO:25044}  Compliance/Adherence/Medication fill history: Care Gaps: Diabetic foot exam A1c   Star-Rating Drugs: Medication:                Last Fill:         Day Supply Metformin 1000 mg     10/26/20            90 Losartan 25 mg           11/17/20            90 Glimepiride 4 mg         11/23/20            90 Simvastatin 63m       11/13/20             90  Patient's preferred pharmacy is:  [id number] [place], [place] - [address]: [phone]: [phone] pill box? {Yes or If no, why not?:20788} Pt endorses ***% compliance  We discussed: {Pharmacy options:24294} Patient decided to: {US Pharmacy PlYJEH:63149}Care Plan and Follow Up Patient Decision:  {F{FWYOVZCH:88502}MiDebbora DusPharmD Clinical  Pharmacist [phone]

## 2021-02-24 NOTE — Telephone Encounter (Signed)
Received note from his insurance about high blood sugars, see if he's interested in referral to nutritionist to help with diabetic diet. If so, I can place referral. He's previously declined

## 2021-02-25 NOTE — Telephone Encounter (Signed)
Lvm asking pt to call back.  Need to relay Dr. G's message.  

## 2021-02-26 NOTE — Telephone Encounter (Signed)
[name] called back in returning lisa call.

## 2021-02-26 NOTE — Telephone Encounter (Addendum)
Spoke with pt relaying Dr. Synthia Innocent message.  Pt verbalizes understanding and agrees to nutritionist referral.  No preference of location.   Also, pt is requesting a trial of Trelegy inhaler.  Says he saw a commercial and thinks it may help him.

## 2021-02-27 MED ORDER — TRELEGY ELLIPTA 100-62.5-25 MCG/ACT IN AEPB: 1.0000 | INHALATION_SPRAY | Freq: Every day | RESPIRATORY_TRACT | 3 refills | Status: DC

## 2021-02-27 NOTE — Addendum Note (Signed)
Addended by: [name] on: 02/27/2021 01:40 PM   Modules accepted: Orders

## 2021-02-27 NOTE — Telephone Encounter (Signed)
Lvm asking pt to call back.  Need to relay Dr. G's message.  

## 2021-02-27 NOTE — Telephone Encounter (Signed)
Nutrition referral placed.  Trelegy is similar to his advair + incruse, just all medications in 1 inhaler which is more convenient but he may also have to pay more. I will send to pharmacy for him to price out. Again, this would replace advair and incruse ellpita.

## 2021-03-02 NOTE — Telephone Encounter (Signed)
Lvm asking pt to call back.  Need to relay Dr. G's message.  

## 2021-03-03 NOTE — Progress Notes (Signed)
COPD disease state and reschedule call assigned to [name] 03/04/21.    Debbora Dus, CPP notified  Margaretmary Dys, [place] Pharmacy Assistant [phone]

## 2021-03-04 ENCOUNTER — Telehealth: Payer: Self-pay

## 2021-03-04 NOTE — Telephone Encounter (Signed)
Lvm asking pt to call back.  Need to relay Dr. G's message.  

## 2021-03-04 NOTE — Progress Notes (Addendum)
Chronic Care Management Pharmacy Assistant   Name: [name]  MRN: [id number] DOB: 18-Sep-1955  Reason for Encounter:  CCM (COPD Disease State)  Recent office visits:  None since last CCM contact  Recent consult visits:  None since last CCM contact  Hospital visits:  None in previous 6 months  Medications: Outpatient Encounter Medications as of 03/04/2021  Medication Sig Note   ACCU-CHEK FASTCLIX LANCETS MISC Check blood sugar once daily and as instructed. Dx 250.00    albuterol (PROVENTIL) (2.5 MG/3ML) 0.083% nebulizer solution TAKE 3 MLS (2.5 MG TOTAL) BY NEBULIZATION EVERY 6 (SIX) HOURS AS NEEDED FOR WHEEZING OR SHORTNESS OF BREATH.    albuterol (VENTOLIN HFA) 108 (90 Base) MCG/ACT inhaler INHALE 2 PUFFS BY MOUTH EVERY 6 HOURS AS NEEDED FOR WHEEZE OR SHORTNESS OF BREATH    aspirin EC 81 MG tablet Take 81 mg by mouth daily.    Blood Glucose Monitoring Suppl (BLOOD GLUCOSE MONITOR SYSTEM) W/DEVICE KIT by Does not apply route. Use to check sugar once daily and as needed Dx: E11.9 **ONE TOUCH VERIO**    carvedilol (COREG) 6.25 MG tablet TAKE 1 TABLET BY MOUTH 2 TIMES DAILY WITH A MEAL.    Cholecalciferol (VITAMIN D) 50 MCG (2000 UT) CAPS Take 1 capsule (2,000 Units total) by mouth daily.    citalopram (CELEXA) 10 MG tablet Take 1 tablet (10 mg total) by mouth daily.    Cyanocobalamin (B-12) 1000 MCG SUBL Place 1 tablet under the tongue daily. 06/20/2019: Not SL, takes tablet   cyclobenzaprine (FLEXERIL) 10 MG tablet TAKE 1/2 TO 1 TABLET BY MOUTH TWICE A DAY AS NEEDED FOR MUSCLE SPASMS(SEDATION PRECAUTIONS)    dapagliflozin propanediol (FARXIGA) 10 MG TABS tablet Take 1 tablet (10 mg total) by mouth daily before breakfast.    ferrous sulfate 325 (65 FE) MG tablet Take 1 tablet (325 mg total) by mouth daily with breakfast.    fluticasone (FLONASE) 50 MCG/ACT nasal spray SPRAY 2 SPRAYS INTO EACH NOSTRIL EVERY DAY    fluticasone-salmeterol (ADVAIR) 100-50 MCG/ACT AEPB Inhale 1  puff into the lungs 2 (two) times daily.    Fluticasone-Umeclidin-Vilant (TRELEGY ELLIPTA) 100-62.5-25 MCG/ACT AEPB Inhale 1 puff into the lungs daily.    furosemide (LASIX) 40 MG tablet TAKE 1 TABLET BY MOUTH TWICE A DAY    glimepiride (AMARYL) 4 MG tablet TAKE 1 TABLET BY MOUTH DAILY WITH BREAKFAST    hydrOXYzine (ATARAX/VISTARIL) 50 MG tablet TAKE 1 TABLET (50 MG TOTAL) BY MOUTH 2 (TWO) TIMES DAILY AS NEEDED FOR ANXIETY.    ipratropium (ATROVENT HFA) 17 MCG/ACT inhaler Inhale 2 puffs into the lungs every 6 (six) hours as needed for wheezing.    losartan (COZAAR) 25 MG tablet TAKE 1 TABLET (25 MG TOTAL) BY MOUTH DAILY.    metFORMIN (GLUCOPHAGE) 1000 MG tablet TAKE 1 TABLET (1,000 MG TOTAL) BY MOUTH 2 (TWO) TIMES DAILY WITH A MEAL.    montelukast (SINGULAIR) 10 MG tablet TAKE 1 TABLET BY MOUTH EVERYDAY AT BEDTIME    ONETOUCH VERIO test strip USE AS INSTRUCTED TO CHECK THREE TIMES DAILY AND AS NEEDED. E11.8    pantoprazole (PROTONIX) 40 MG tablet TAKE 1 TABLET (40 MG TOTAL) BY MOUTH 2 (TWO) TIMES DAILY BEFORE A MEAL.    Probiotic Product (PROBIOTIC DAILY PO) Take by mouth.    simvastatin (ZOCOR) 20 MG tablet TAKE 1 TABLET BY MOUTH EVERY DAY IN THE EVENING    umeclidinium bromide (INCRUSE ELLIPTA) 62.5 MCG/INH AEPB Inhale 1 puff into  the lungs daily.    No facility-administered encounter medications on file as of 03/04/2021.   Attempted contact with patient 3 times on 11/09, 11/11 and 11/16. Unsuccessful outreach. CCM visit scheduled for February 2022.   Current COPD regimen:  Albuterol HFA 90 MCG - Inhale 2 puffs by mouth every 6 hours as needed for wheeze or shortness of breath.  Montelukast SOD 10 mg tablet - Take 1 tablet by mouth everyday at bedtime.  Wixela 100-50 INHUB - Inhale 1 puff into the lungs twice a day   Trelegy Ellipta 100-62.5-25 MCG  Adherence Review: Does the patient have >5 day gap between last estimated fill date for maintenance inhaler medications? Unable to verify with  patient  No appointments scheduled within the next 30 days.  Care Gaps: Annual wellness visit in last year? Yes 07/09/2020 Most Recent BP reading: 136/74 on 08/13/2020  If Diabetic: Most recent A1C reading: 8.7 on 07/09/2020 Last eye exam / retinopathy screening: Up to date Last diabetic foot exam: 06/04/2016  Star Rated Drugs Medication Name  Last Fill Date  Day supply Glimepiride 4 mg  03/04/2021  90 Metformin HCL 1,000 mg 01/24/2021  90 Simvastatin 20 mg  02/06/2021  90 [facility]  Debbora Dus, CPP notified  Marijean Niemann, [place] Assistant [phone]  I have reviewed the care management and care coordination activities outlined in this encounter and I am certifying that I agree with the content of this note. No further action required.  Debbora Dus, PharmD Clinical Pharmacist [place] [facility] at [facility] [phone]

## 2021-03-05 NOTE — Addendum Note (Signed)
Addended by: Brenton Grills on: 02/40/9735 03:28 PM   Modules accepted: Orders

## 2021-03-05 NOTE — Telephone Encounter (Signed)
Pt returning call

## 2021-03-05 NOTE — Telephone Encounter (Signed)
Pt returning my call.  States the Trelegy was twice as expensive.  So pt will continue with Advair and Incruse Ellipta.  Also, pt wants to inform Dr. Darnell Level that he's refusing nutrition services at this time.  Says there's no use of talking to them to start diet changes he won't stick with.  FYI to Dr. Darnell Level.   Discontinued med in pt's list.

## 2021-03-05 NOTE — Telephone Encounter (Signed)
Left message on vm per dpr, relaying Dr. Synthia Innocent message.

## 2021-03-06 NOTE — Addendum Note (Signed)
Addended by: [name] on: 03/06/2021 07:31 AM   Modules accepted: Orders

## 2021-03-06 NOTE — Telephone Encounter (Signed)
Nutrition referral cancelled.

## 2021-03-16 ENCOUNTER — Telehealth: Payer: Self-pay | Admitting: Family Medicine

## 2021-03-16 NOTE — Telephone Encounter (Signed)
Pt called in requesting a called back he has questions about getting a apparatus for breathing [phone]

## 2021-03-17 NOTE — Telephone Encounter (Signed)
Spoke to patient by telephone and was advised that he has seen advertised on television [employer] for sleep apnea. Patient wants to know if he would be a candidate for that and if so how does he go about getting it. Patient stated that he has the tendency to take his CPAP off during the night while he is asleep.

## 2021-03-18 NOTE — Telephone Encounter (Signed)
Lvm asking pt to call back.  Need to relay Dr. G's message.  

## 2021-03-18 NOTE — Telephone Encounter (Signed)
I placed referral back in April for this - did he ever see Dr Tami Ribas at [facility]?

## 2021-03-23 NOTE — Telephone Encounter (Signed)
Lvm asking pt to call back.  Need to relay Dr. G's message.  

## 2021-03-24 NOTE — Telephone Encounter (Signed)
Lvm asking pt to call back.  Need to relay Dr. G's message.  

## 2021-03-25 NOTE — Telephone Encounter (Signed)
Mailing a letter.

## 2021-04-02 ENCOUNTER — Telehealth: Payer: Self-pay

## 2021-04-02 NOTE — Telephone Encounter (Addendum)
Noted  

## 2021-04-02 NOTE — Telephone Encounter (Signed)
I spoke with pt; pt said he had had trouble breathing for at least 6 months but has worsened the last wk; pt is completely out of breath just walking to the bathroom. Pt has mid dull CP with mid abd tightness when gets out of breath due to exertion. Pt said he has a lot of gas in stomach and pt is sure it is not fluid in abd. Pt is not having fever, cough, no vomiting or diarrhea. Pt is nauseated on and off. Pt said he has neck issues and does not think the CP is radiating to pts neck. Pt said the neck problem is a separate issue. Pt said he has been to card and pt does not think this is his heart related. Pt already has an appt on 04/03/21 at 2:30 with Dr Darnell Level. Pt does not want to go to [facility] or ED now;Pt promised if condition changes or worsens he will go to ED prior to appt with Dr Darnell Level. Sending note to Dr Darnell Level and Lattie Haw CMA and will teams [facility].

## 2021-04-02 NOTE — Telephone Encounter (Signed)
Pt is on Dr. Synthia Innocent schedule tomorrow at 2:30 for trouble breathing.    Plz triage pt.

## 2021-04-02 NOTE — Telephone Encounter (Signed)
Unable to reach pt by phone will leave v/m for pt to cb. Unable to reach Mrs Melven Sartorius and Littie Deeds (sisters of pts) no contact #.Sending note to triage and [facility] CMA. I spoke with [place] and she could not get thru to access nurse initially.

## 2021-04-03 ENCOUNTER — Ambulatory Visit (INDEPENDENT_AMBULATORY_CARE_PROVIDER_SITE_OTHER): Payer: Medicare Other | Admitting: Family Medicine

## 2021-04-03 ENCOUNTER — Other Ambulatory Visit: Payer: Self-pay

## 2021-04-03 ENCOUNTER — Encounter: Payer: Self-pay | Admitting: Family Medicine

## 2021-04-03 VITALS — BP 134/78 | HR 70 | Temp 97.7°F | Ht 72.0 in | Wt 288.2 lb

## 2021-04-03 DIAGNOSIS — J449 Chronic obstructive pulmonary disease, unspecified: Secondary | ICD-10-CM

## 2021-04-03 DIAGNOSIS — D509 Iron deficiency anemia, unspecified: Secondary | ICD-10-CM

## 2021-04-03 DIAGNOSIS — G4733 Obstructive sleep apnea (adult) (pediatric): Secondary | ICD-10-CM | POA: Diagnosis not present

## 2021-04-03 DIAGNOSIS — R0609 Other forms of dyspnea: Secondary | ICD-10-CM | POA: Diagnosis not present

## 2021-04-03 DIAGNOSIS — E1169 Type 2 diabetes mellitus with other specified complication: Secondary | ICD-10-CM | POA: Diagnosis not present

## 2021-04-03 DIAGNOSIS — K429 Umbilical hernia without obstruction or gangrene: Secondary | ICD-10-CM | POA: Diagnosis not present

## 2021-04-03 DIAGNOSIS — R140 Abdominal distension (gaseous): Secondary | ICD-10-CM

## 2021-04-03 DIAGNOSIS — R1013 Epigastric pain: Secondary | ICD-10-CM

## 2021-04-03 DIAGNOSIS — I5022 Chronic systolic (congestive) heart failure: Secondary | ICD-10-CM | POA: Diagnosis not present

## 2021-04-03 DIAGNOSIS — Z87891 Personal history of nicotine dependence: Secondary | ICD-10-CM

## 2021-04-03 MED ORDER — FLUTICASONE-SALMETEROL 100-50 MCG/ACT IN AEPB: 1.0000 | INHALATION_SPRAY | Freq: Two times a day (BID) | RESPIRATORY_TRACT | 11 refills | Status: DC

## 2021-04-03 MED ORDER — UMECLIDINIUM BROMIDE 62.5 MCG/ACT IN AEPB: 1.0000 | INHALATION_SPRAY | Freq: Every day | RESPIRATORY_TRACT | 11 refills | Status: DC

## 2021-04-03 NOTE — Progress Notes (Signed)
Patient ID: [name], male    DOB: [birth year], 65 y.o.   MRN: [id number]  This visit was conducted in person.  BP 134/78   Pulse 70   Temp 97.7 F (36.5 C) (Temporal)   Ht 6' (1.829 m)   Wt 288 lb 4 oz (130.7 kg)   SpO2 91%   BMI 39.09 kg/m    CC: dyspnea  Subjective:   HPI: [name] is a 65 y.o. male presenting on 04/03/2021 for Shortness of Breath (C/o trouble breathing for past 6 mos, worsening in past wk. )  Some confusion over medications he's taking.   Known chronic dyspnea due to COPD significantly benefited from daily incruse ellipta + advair with PRN albuterol. Currently seems to be only on Wixela, not on incruse ellipta.   Notes worsening exertional dyspnea over the past week.  No increase in cough or sputum production. Home O2 sat running 89-90%.  Some ongoing nausea. Sugars have been running elevated. Any meal causes bloating, gas, indigestion which leads to marked shortness of breath.  Milk of magnesia has helped these symptoms some. He continues pantoprazole 23m bid.   Last saw pulm Dr RAshby Dawes3/2020 for chronic bronchitis with severe OSA. Has had trouble tolerating CPAP, [facility] - may have missed appt, rescheduled for 04/2021.   Quit MJ 1 month ago to see if that would help current symptoms.  Quit cigarettes 06/2018.      Relevant past medical, surgical, family and social history reviewed and updated as indicated. Interim medical history since our last visit reviewed. Allergies and medications reviewed and updated. Outpatient Medications Prior to Visit  Medication Sig Dispense Refill   ACCU-CHEK FASTCLIX LANCETS MISC Check blood sugar once daily and as instructed. Dx 250.00 100 each 3   albuterol (PROVENTIL) (2.5 MG/3ML) 0.083% nebulizer solution TAKE 3 MLS (2.5 MG TOTAL) BY NEBULIZATION EVERY 6 (SIX) HOURS AS NEEDED FOR WHEEZING OR  SHORTNESS OF BREATH. 150 mL 1   albuterol (VENTOLIN HFA) 108 (90 Base) MCG/ACT inhaler INHALE 2 PUFFS BY MOUTH EVERY 6 HOURS AS NEEDED FOR WHEEZE OR SHORTNESS OF BREATH 8.5 each 1   aspirin EC 81 MG tablet Take 81 mg by mouth daily.     Blood Glucose Monitoring Suppl (BLOOD GLUCOSE MONITOR SYSTEM) W/DEVICE KIT by Does not apply route. Use to check sugar once daily and as needed Dx: E11.9 **ONE TOUCH VERIO**     carvedilol (COREG) 6.25 MG tablet TAKE 1 TABLET BY MOUTH 2 TIMES DAILY WITH A MEAL. 180 tablet 2   Cholecalciferol (VITAMIN D) 50 MCG (2000 UT) CAPS Take 1 capsule (2,000 Units total) by mouth daily. 30 capsule    citalopram (CELEXA) 10 MG tablet Take 1 tablet (10 mg total) by mouth daily. 90 tablet 3   Cyanocobalamin (B-12) 1000 MCG SUBL Place 1 tablet under the tongue daily.     cyclobenzaprine (FLEXERIL) 10 MG tablet TAKE 1/2 TO 1 TABLET BY MOUTH TWICE A DAY AS NEEDED FOR MUSCLE SPASMS(SEDATION PRECAUTIONS) 30 tablet 0   dapagliflozin propanediol (FARXIGA) 10 MG TABS tablet Take 1 tablet (10 mg total) by mouth daily before breakfast. 30 tablet 11   ferrous sulfate 325 (65 FE) MG tablet Take 1 tablet (325 mg total) by mouth daily with breakfast.     fluticasone (FLONASE) 50 MCG/ACT nasal spray SPRAY 2 SPRAYS INTO EACH NOSTRIL EVERY DAY 48 mL 1  furosemide (LASIX) 40 MG tablet TAKE 1 TABLET BY MOUTH TWICE A DAY 180 tablet 1   glimepiride (AMARYL) 4 MG tablet TAKE 1 TABLET BY MOUTH DAILY WITH BREAKFAST 90 tablet 3   hydrOXYzine (ATARAX/VISTARIL) 50 MG tablet TAKE 1 TABLET (50 MG TOTAL) BY MOUTH 2 (TWO) TIMES DAILY AS NEEDED FOR ANXIETY. 180 tablet 1   ipratropium (ATROVENT HFA) 17 MCG/ACT inhaler Inhale 2 puffs into the lungs every 6 (six) hours as needed for wheezing. 1 each 12   losartan (COZAAR) 25 MG tablet TAKE 1 TABLET (25 MG TOTAL) BY MOUTH DAILY. 90 tablet 0   metFORMIN (GLUCOPHAGE) 1000 MG tablet TAKE 1 TABLET (1,000 MG TOTAL) BY MOUTH 2 (TWO) TIMES DAILY WITH A MEAL. 180 tablet 3    montelukast (SINGULAIR) 10 MG tablet TAKE 1 TABLET BY MOUTH EVERYDAY AT BEDTIME 90 tablet 3   ONETOUCH VERIO test strip USE AS INSTRUCTED TO CHECK THREE TIMES DAILY AND AS NEEDED. E11.8 300 strip 0   pantoprazole (PROTONIX) 40 MG tablet TAKE 1 TABLET (40 MG TOTAL) BY MOUTH 2 (TWO) TIMES DAILY BEFORE A MEAL. 180 tablet 3   Probiotic Product (PROBIOTIC DAILY PO) Take by mouth.     simvastatin (ZOCOR) 20 MG tablet TAKE 1 TABLET BY MOUTH EVERY DAY IN THE EVENING 90 tablet 1   fluticasone-salmeterol (ADVAIR) 100-50 MCG/ACT AEPB Inhale 1 puff into the lungs 2 (two) times daily. 1 each 11   umeclidinium bromide (INCRUSE ELLIPTA) 62.5 MCG/INH AEPB Inhale 1 puff into the lungs daily. 30 each 11   No facility-administered medications prior to visit.     Per HPI unless specifically indicated in ROS section below Review of Systems  Objective:  BP 134/78   Pulse 70   Temp 97.7 F (36.5 C) (Temporal)   Ht 6' (1.829 m)   Wt 288 lb 4 oz (130.7 kg)   SpO2 91%   BMI 39.09 kg/m   Wt Readings from Last 3 Encounters:  04/03/21 288 lb 4 oz (130.7 kg)  08/13/20 277 lb 5 oz (125.8 kg)  08/05/20 276 lb (125.2 kg)      Physical Exam Vitals and nursing note reviewed.  Constitutional:      Appearance: Normal appearance. He is obese. He is not ill-appearing.  Cardiovascular:     Rate and Rhythm: Normal rate and regular rhythm.     Pulses: Normal pulses.     Heart sounds: Normal heart sounds. No murmur heard. Pulmonary:     Effort: Pulmonary effort is normal. No respiratory distress.     Breath sounds: Normal breath sounds. No wheezing, rhonchi or rales.  Abdominal:     General: Abdomen is protuberant. Bowel sounds are normal.     Palpations: Abdomen is soft. There is no mass.     Tenderness: There is no abdominal tenderness. There is no guarding or rebound.     Hernia: A hernia is present. There is no hernia in the umbilical area (large).  Musculoskeletal:     Cervical back: Normal range of  motion and neck supple.     Right lower leg: Edema (tr) present.     Left lower leg: Edema (r) present.  Skin:    General: Skin is warm and dry.     Findings: No rash.  Neurological:     Mental Status: He is alert.  Psychiatric:        Mood and Affect: Mood normal.        Behavior: Behavior normal.  Results for orders placed or performed in visit on 04/03/21  Hemoglobin A1c  Result Value Ref Range   Hgb A1c MFr Bld 9.6 (H) <5.7 % of total Hgb   Mean Plasma Glucose 229 mg/dL   eAG (mmol/L) 12.7 mmol/L  CBC with Differential/Platelet  Result Value Ref Range   WBC 10.7 3.8 - 10.8 Thousand/uL   RBC 5.57 4.20 - 5.80 Million/uL   Hemoglobin 12.7 (L) 13.2 - 17.1 g/dL   HCT 42.4 38.5 - 50.0 %   MCV 76.1 (L) 80.0 - 100.0 fL   MCH 22.8 (L) 27.0 - 33.0 pg   MCHC 30.0 (L) 32.0 - 36.0 g/dL   RDW 15.3 (H) 11.0 - 15.0 %   Platelets 245 140 - 400 Thousand/uL   MPV 11.1 7.5 - 12.5 fL   Neutro Abs 7,158 1,500 - 7,800 cells/uL   Lymphs Abs 2,311 850 - 3,900 cells/uL   Absolute Monocytes 974 (H) 200 - 950 cells/uL   Eosinophils Absolute 182 15 - 500 cells/uL   Basophils Absolute 75 0 - 200 cells/uL   Neutrophils Relative % 66.9 %   Total Lymphocyte 21.6 %   Monocytes Relative 9.1 %   Eosinophils Relative 1.7 %   Basophils Relative 0.7 %  Basic metabolic panel  Result Value Ref Range   Glucose, Bld 157 (H) 65 - 99 mg/dL   BUN 7 7 - 25 mg/dL   Creat 0.60 (L) 0.70 - 1.35 mg/dL   BUN/Creatinine Ratio 12 6 - 22 (calc)   Sodium 140 135 - 146 mmol/L   Potassium 4.6 3.5 - 5.3 mmol/L   Chloride 97 (L) 98 - 110 mmol/L   CO2 32 20 - 32 mmol/L   Calcium 9.5 8.6 - 10.3 mg/dL  Brain natriuretic peptide  Result Value Ref Range   Brain Natriuretic Peptide 54 <100 pg/mL    Assessment & Plan:  This visit occurred during the SARS-CoV-2 public health emergency.  Safety protocols were in place, including screening questions prior to the visit, additional usage of staff PPE, and extensive  cleaning of exam room while observing appropriate contact time as indicated for disinfecting solutions.   Problem List Items Addressed This Visit     Ex-smoker   OSA (obstructive sleep apnea)    Known severe OSA, with difficulty tolerating CPAP machine.  Pending ENT eval for Inspire device (scheduled for 04/2021)      Severe obesity (BMI 35.0-39.9) with comorbidity ([place])    Discussed weight gain noted which could contribute to dyspnea.       Type 2 diabetes mellitus with other specified complication (HCC)    Chronic, uncontrolled. He is unsure if he's taking farxiga. Will refill and advised he check at home. Update A1c.      Relevant Orders   Hemoglobin A1c (Completed)   Basic metabolic panel (Completed)   Exertional dyspnea - Primary    Possibly multifactorial - COPD, CHF, GI contribution.  See above re plan.  Check labs including BNP, CBC.  Update with effect of restarting incruse ellipta which previously significantly helped COPD-related dyspnea.       Relevant Orders   CBC with Differential/Platelet (Completed)   Basic metabolic panel (Completed)   Brain natriuretic peptide (Completed)   Umbilical hernia without obstruction and without gangrene    Large, told elective procedure as given large size low risk of incarceration.       Gassiness   Chronic systolic CHF (congestive heart failure) (HCC)    Chronic on  lasix 89m BID. Possible CHF exacerbation contributing to exertional dyspnea - check BNP and increase lasix to 80/414mfor the next 3 days. Update with effect.       COPD (chronic obstructive pulmonary disease) (HCC)    Symptoms not consistent with COPD exacerbation however it seems he's only been taking wixela and not incruse ellipta - this could contribute to worsened dyspnea. Will refill both, encouraged he take both controller inhalers daily, update if not improved with this.      Relevant Medications   umeclidinium bromide (INCRUSE ELLIPTA) 62.5 MCG/ACT AEPB    fluticasone-salmeterol (WIXELA INHUB) 100-50 MCG/ACT AEPB   Epigastric abdominal pain    Reassuring EGD/colonoscopy 03/2020.  Continues ppi bid.  Ok to continue milk of magnesia PRN.  Update labs.       Iron deficiency anemia    Has continued iron daily.  Check CBC, consider iron infusion.         Meds ordered this encounter  Medications   umeclidinium bromide (INCRUSE ELLIPTA) 62.5 MCG/ACT AEPB    Sig: Inhale 1 puff into the lungs daily.    Dispense:  30 each    Refill:  11   fluticasone-salmeterol (WIXELA INHUB) 100-50 MCG/ACT AEPB    Sig: Inhale 1 puff into the lungs 2 (two) times daily.    Dispense:  60 each    Refill:  11   Orders Placed This Encounter  Procedures   Hemoglobin A1c   CBC with Differential/Platelet   Basic metabolic panel   Brain natriuretic peptide     Patient Instructions  Labs today  Increase lasix to 807mn the morning and 64m20m afternoon daily for 3 day.  Take both incruse ellipta (1 puff daily) and wixela (1 puff twice daily) as your daily breathing medicines.  Check if you're taking farxiga which would help with shortness of breath and sugar control.  Keep ENT appointment for January 2023.  Schedule physical for after 07/16/2021.   Follow up plan: Return in about 3 months (around 07/16/2021) for annual exam, prior fasting for blood work, medicare wellness visit.  JaviRia Bush

## 2021-04-03 NOTE — Patient Instructions (Addendum)
Labs today  Increase lasix to 80mg  in the morning and 40mg  in afternoon daily for 3 day.  Take both incruse ellipta (1 puff daily) and wixela (1 puff twice daily) as your daily breathing medicines.  Check if you're taking farxiga which would help with shortness of breath and sugar control.  Keep ENT appointment for January 2023.  Schedule physical for after 07/16/2021.

## 2021-04-04 LAB — CBC WITH DIFFERENTIAL/PLATELET
Absolute Monocytes: 974 cells/uL — ABNORMAL HIGH (ref 200–950)
Basophils Absolute: 75 cells/uL (ref 0–200)
Basophils Relative: 0.7 %
Eosinophils Absolute: 182 cells/uL (ref 15–500)
Eosinophils Relative: 1.7 %
HCT: 42.4 % (ref 38.5–50.0)
Hemoglobin: 12.7 g/dL — ABNORMAL LOW (ref 13.2–17.1)
Lymphs Abs: 2311 cells/uL (ref 850–3900)
MCH: 22.8 pg — ABNORMAL LOW (ref 27.0–33.0)
MCHC: 30 g/dL — ABNORMAL LOW (ref 32.0–36.0)
MCV: 76.1 fL — ABNORMAL LOW (ref 80.0–100.0)
MPV: 11.1 fL (ref 7.5–12.5)
Monocytes Relative: 9.1 %
Neutro Abs: 7158 cells/uL (ref 1500–7800)
Neutrophils Relative %: 66.9 %
Platelets: 245 10*3/uL (ref 140–400)
RBC: 5.57 10*6/uL (ref 4.20–5.80)
RDW: 15.3 % — ABNORMAL HIGH (ref 11.0–15.0)
Total Lymphocyte: 21.6 %
WBC: 10.7 10*3/uL (ref 3.8–10.8)

## 2021-04-04 LAB — BASIC METABOLIC PANEL
BUN/Creatinine Ratio: 12 (calc) (ref 6–22)
BUN: 7 mg/dL (ref 7–25)
CO2: 32 mmol/L (ref 20–32)
Calcium: 9.5 mg/dL (ref 8.6–10.3)
Chloride: 97 mmol/L — ABNORMAL LOW (ref 98–110)
Creat: 0.6 mg/dL — ABNORMAL LOW (ref 0.70–1.35)
Glucose, Bld: 157 mg/dL — ABNORMAL HIGH (ref 65–99)
Potassium: 4.6 mmol/L (ref 3.5–5.3)
Sodium: 140 mmol/L (ref 135–146)

## 2021-04-04 LAB — HEMOGLOBIN A1C
Hgb A1c MFr Bld: 9.6 % of total Hgb — ABNORMAL HIGH (ref <5.7)
Mean Plasma Glucose: 229 mg/dL
eAG (mmol/L): 12.7 mmol/L

## 2021-04-04 LAB — BRAIN NATRIURETIC PEPTIDE: Brain Natriuretic Peptide: 54 pg/mL (ref <100)

## 2021-04-04 NOTE — Assessment & Plan Note (Signed)
Chronic, uncontrolled. He is unsure if he's taking farxiga. Will refill and advised he check at home. Update A1c.

## 2021-04-04 NOTE — Assessment & Plan Note (Addendum)
Chronic on lasix 40mg  BID. Possible CHF exacerbation contributing to exertional dyspnea - check BNP and increase lasix to 80/40mg  for the next 3 days. Update with effect.

## 2021-04-04 NOTE — Assessment & Plan Note (Signed)
Large, told elective procedure as given large size low risk of incarceration.

## 2021-04-04 NOTE — Assessment & Plan Note (Signed)
Known severe OSA, with difficulty tolerating CPAP machine.  Pending ENT eval for Inspire device (scheduled for 04/2021)

## 2021-04-04 NOTE — Assessment & Plan Note (Signed)
Reassuring EGD/colonoscopy 03/2020.  Continues ppi bid.  Ok to continue milk of magnesia PRN.  Update labs.

## 2021-04-04 NOTE — Assessment & Plan Note (Signed)
Has continued iron daily.  Check CBC, consider iron infusion.

## 2021-04-04 NOTE — Assessment & Plan Note (Signed)
Discussed weight gain noted which could contribute to dyspnea.

## 2021-04-04 NOTE — Assessment & Plan Note (Signed)
Symptoms not consistent with COPD exacerbation however it seems he's only been taking wixela and not incruse ellipta - this could contribute to worsened dyspnea. Will refill both, encouraged he take both controller inhalers daily, update if not improved with this.

## 2021-04-04 NOTE — Assessment & Plan Note (Signed)
Possibly multifactorial - COPD, CHF, GI contribution.  See above re plan.  Check labs including BNP, CBC.  Update with effect of restarting incruse ellipta which previously significantly helped COPD-related dyspnea.

## 2021-04-08 ENCOUNTER — Telehealth: Payer: Self-pay | Admitting: Family Medicine

## 2021-04-08 NOTE — Telephone Encounter (Signed)
Spoke to patient and documented in separate encounter.

## 2021-04-08 NOTE — Telephone Encounter (Signed)
Pt returning call .got a message to call the office

## 2021-04-08 NOTE — Progress Notes (Signed)
Noted! Thank you

## 2021-04-24 ENCOUNTER — Telehealth: Payer: Self-pay | Admitting: Family Medicine

## 2021-04-24 ENCOUNTER — Other Ambulatory Visit: Payer: Self-pay | Admitting: Family Medicine

## 2021-04-24 NOTE — Telephone Encounter (Signed)
Refill sent in earlier today.

## 2021-04-24 NOTE — Telephone Encounter (Signed)
°  Encourage patient to contact the pharmacy for refills or they can request refills through [place]:  Please schedule appointment if longer than 1 year  NEXT APPOINTMENT DATE:07/15/2021  MEDICATION:albuterol (PROVENTIL) (2.5 MG/3ML) 0.083% nebulizer solution  Is the patient out of medication? yes  PHARMACY:[id number] [place], [place] - [place]  Let patient know to contact pharmacy at the end of the day to make sure medication is ready.  Please notify patient to allow 48-72 hours to process  CLINICAL FILLS OUT ALL BELOW:   LAST REFILL:  QTY:  REFILL DATE:    OTHER COMMENTS:    Okay for refill?  Please advise

## 2021-04-26 DIAGNOSIS — Z79899 Other long term (current) drug therapy: Secondary | ICD-10-CM

## 2021-04-26 HISTORY — DX: Other long term (current) drug therapy: Z79.899

## 2021-05-03 ENCOUNTER — Other Ambulatory Visit: Payer: Self-pay | Admitting: Family Medicine

## 2021-05-04 NOTE — Telephone Encounter (Signed)
Refill request Hydroxyzine Last office visit 04/03/21 Last refill 10/28/20 #180/1

## 2021-05-12 DIAGNOSIS — E1169 Type 2 diabetes mellitus with other specified complication: Secondary | ICD-10-CM | POA: Diagnosis not present

## 2021-05-12 DIAGNOSIS — I4892 Unspecified atrial flutter: Secondary | ICD-10-CM | POA: Diagnosis not present

## 2021-05-12 DIAGNOSIS — I4891 Unspecified atrial fibrillation: Secondary | ICD-10-CM | POA: Diagnosis not present

## 2021-05-12 DIAGNOSIS — E785 Hyperlipidemia, unspecified: Secondary | ICD-10-CM | POA: Diagnosis not present

## 2021-05-12 DIAGNOSIS — E782 Mixed hyperlipidemia: Secondary | ICD-10-CM | POA: Diagnosis not present

## 2021-05-12 DIAGNOSIS — I25118 Atherosclerotic heart disease of native coronary artery with other forms of angina pectoris: Secondary | ICD-10-CM | POA: Diagnosis not present

## 2021-05-12 DIAGNOSIS — R0602 Shortness of breath: Secondary | ICD-10-CM | POA: Diagnosis not present

## 2021-05-12 DIAGNOSIS — I5022 Chronic systolic (congestive) heart failure: Secondary | ICD-10-CM | POA: Diagnosis not present

## 2021-05-13 ENCOUNTER — Other Ambulatory Visit: Payer: Self-pay | Admitting: Family Medicine

## 2021-05-14 ENCOUNTER — Telehealth: Payer: Self-pay

## 2021-05-14 NOTE — Progress Notes (Addendum)
Chronic Care Management Pharmacy Assistant   Name: [name]  MRN: [id number] DOB: [birth year]  Reason for Encounter: CCM (COPD Disease State)   Recent office visits:  04/03/2021 - [name] Bush, MD - Patient presented for shortness of breath. Labs: BMP, CBC, A1c and Brain Natriuretic Peptide.   Recent consult visits:  05/12/2021 - Cardiology - Patient presented for Atrial Fibrillation and Flutter. Procedure: EKG. Discontinued (no reason given): Albuterol 2.5 mg, Isosorbide Mononitrate 20 mg ER tablet. Change: Cyanocobalamin 1000 mcg tablet vs. 1000 mcg SL tablet.   Hospital visits:  None in previous 6 months  Medications: Outpatient Encounter Medications as of 05/14/2021  Medication Sig Note   ACCU-CHEK FASTCLIX LANCETS MISC Check blood sugar once daily and as instructed. Dx 250.00    albuterol (PROVENTIL) (2.5 MG/3ML) 0.083% nebulizer solution INHALE 3 ML BY NEBULIZATION EVERY 6 HOURS AS NEEDED FOR WHEEZING OR SHORTNESS OF BREATH    albuterol (VENTOLIN HFA) 108 (90 Base) MCG/ACT inhaler INHALE 2 PUFFS BY MOUTH EVERY 6 HOURS AS NEEDED FOR WHEEZE OR SHORTNESS OF BREATH    aspirin EC 81 MG tablet Take 81 mg by mouth daily.    Blood Glucose Monitoring Suppl (BLOOD GLUCOSE MONITOR SYSTEM) W/DEVICE KIT by Does not apply route. Use to check sugar once daily and as needed Dx: E11.9 **ONE TOUCH VERIO**    carvedilol (COREG) 6.25 MG tablet TAKE 1 TABLET BY MOUTH 2 TIMES DAILY WITH A MEAL.    Cholecalciferol (VITAMIN D) 50 MCG (2000 UT) CAPS Take 1 capsule (2,000 Units total) by mouth daily.    citalopram (CELEXA) 10 MG tablet Take 1 tablet (10 mg total) by mouth daily.    Cyanocobalamin (B-12) 1000 MCG SUBL Place 1 tablet under the tongue daily. 06/20/2019: Not SL, takes tablet   cyclobenzaprine (FLEXERIL) 10 MG tablet TAKE 1/2 TO 1 TABLET BY MOUTH TWICE A DAY AS NEEDED FOR MUSCLE SPASMS(SEDATION PRECAUTIONS)    dapagliflozin propanediol (FARXIGA) 10 MG TABS tablet Take 1 tablet (10  mg total) by mouth daily before breakfast.    ferrous sulfate 325 (65 FE) MG tablet Take 1 tablet (325 mg total) by mouth daily with breakfast.    fluticasone (FLONASE) 50 MCG/ACT nasal spray SPRAY 2 SPRAYS INTO EACH NOSTRIL EVERY DAY    fluticasone-salmeterol (WIXELA INHUB) 100-50 MCG/ACT AEPB Inhale 1 puff into the lungs 2 (two) times daily.    furosemide (LASIX) 40 MG tablet TAKE 1 TABLET BY MOUTH TWICE A DAY    glimepiride (AMARYL) 4 MG tablet TAKE 1 TABLET BY MOUTH DAILY WITH BREAKFAST    hydrOXYzine (ATARAX) 50 MG tablet TAKE 1 TABLET (50 MG TOTAL) BY MOUTH 2 (TWO) TIMES DAILY AS NEEDED FOR ANXIETY.    ipratropium (ATROVENT HFA) 17 MCG/ACT inhaler Inhale 2 puffs into the lungs every 6 (six) hours as needed for wheezing.    losartan (COZAAR) 25 MG tablet TAKE 1 TABLET (25 MG TOTAL) BY MOUTH DAILY.    metFORMIN (GLUCOPHAGE) 1000 MG tablet TAKE 1 TABLET (1,000 MG TOTAL) BY MOUTH 2 (TWO) TIMES DAILY WITH A MEAL.    montelukast (SINGULAIR) 10 MG tablet TAKE 1 TABLET BY MOUTH EVERYDAY AT BEDTIME    ONETOUCH VERIO test strip USE AS INSTRUCTED TO CHECK THREE TIMES DAILY AND AS NEEDED. E11.8    pantoprazole (PROTONIX) 40 MG tablet TAKE 1 TABLET (40 MG TOTAL) BY MOUTH 2 (TWO) TIMES DAILY BEFORE A MEAL.    Probiotic Product (PROBIOTIC DAILY PO) Take by mouth.  simvastatin (ZOCOR) 20 MG tablet TAKE 1 TABLET BY MOUTH EVERY DAY IN THE EVENING    umeclidinium bromide (INCRUSE ELLIPTA) 62.5 MCG/ACT AEPB Inhale 1 puff into the lungs daily.    No facility-administered encounter medications on file as of 05/14/2021.   Contacted patient to discuss COPD disease state:  Current COPD regimen:  Albuterol HFA 90 MCG - Inhale 2 puffs by mouth every 6 hours as needed for wheeze or shortness of breath. Montelukast SOD 10 mg tablet - Take 1 tablet by mouth everyday at bedtime Wixela 100-50 INHUB - Inhale 1 puff into the lungs twice a day Incruse Ellipta 62.5 MCG - Inhale 1 puff into the lungs daily Albuterol 2.5  mg nebulizer solution  Any recent hospitalizations or ED visits since last visit with CPP? No  reports the following COPD symptoms, including shortness of breath.    What recent interventions/DTPs have been made by any provider to improve breathing since last visit: Patient was only taking Wixela and not Incruse Elipta - this could contribute to worsened dyspnea. Encouraged to take both controller inhalers daily.   Have you had exacerbation/flare-up since last visit? Yes  What do you do when you are short of breath?  Rescue medication  Current tobacco use? None tobacco use  Respiratory Devices/Equipment Do you have a nebulizer? Yes Albuterol 2.5 mg nebulizer solution Do you use a Peak Flow Meter? No Do you use a maintenance inhaler? Yes  Wixela 100-50 INHUB - Inhale 1 puff into the lungs twice a day Incruse Ellipta 62.5 MCG - Inhale 1 puff into the lungs daily How often do you forget to use your daily inhaler? Rarely Do you use a rescue inhaler? Yes Albuterol HFA 90 MCG - Inhale 2 puffs by mouth every 6 hours as needed for wheeze or shortness of breath. How often do you use your rescue inhaler?  daily Do you use a spacer with your inhaler? No  Adherence Review: Does the patient have >5 day gap between last estimated fill date for maintenance inhaler medications? No  Wixela 100-50  - 05/07/2021 - 90 ds Incruse Ellipta 62.5 mg - 05/02/2021 - 30 ds  PCP appointment on 07/20/2020 for Physical CCM appointment on 07/27/2021 at 9:00 am with [place] Gaps: Annual wellness visit in last year? Yes 07/09/2020 Most Recent BP reading: 134/78 on 04/03/2021  If Diabetic: Most recent A1C reading: 9.6 on 04/03/2021 Last eye exam / retinopathy screening: Up to date Last diabetic foot exam: 06/04/2016  [facility] Medication Name  Last Fill Date  Day supply Losartan 25 mg  11/17/2020  90   Simvastatin 20 mg  04/25/2021  90 Metformin 1000 mg  04/21/2021  90 Glimepiride 4  mg  03/03/2021  90 Farxiga 10 mg   09/15/2020  90    [facility]  Patient informed me that he tried to get his [facility]elukast 10 mg [facility]. After looking in the patient's chart, [facility] shows it [facility] filled on 04/21/2021 for 90ds. I called [facility] and the pharmacist states it was picked up on Dec 30 at 5:40 pm along with Metformin and Pantoprazole. Patient also picked up Eliquis on 05/12/2021. I called patient to inform him; he found to Montelukast in his vehicle.  Debbora Dus, CPP notified  [name], [place] Clinical Pharmacy Assistant [phone]  Time Spent: 5 Minutes  I have reviewed the care management and care coordination activities outlined in this encounter  and I am certifying that I agree with the content of this note. No further action required.  Debbora Dus, PharmD Clinical Pharmacist [facility] [phone]

## 2021-05-14 NOTE — Progress Notes (Addendum)
Patient called me back. He states that he did not pick up Montelukast but did get his Metformin and Pantoprazole. Patient stated [facility] and [facility] going [facility] to see exactly what he picked up. Patient is wondering what will happen if he can not take this medication. Patient states he has already been out for two days. Since he can not get it refilled for another 90 days is there something else he can take? Patient would like a call back from Dr. Bosie Clos office on what to do.   Debbora Dus, CPP notified  Marijean Niemann, [place] Clinical Pharmacy Assistant [phone]

## 2021-05-15 ENCOUNTER — Other Ambulatory Visit: Payer: Self-pay | Admitting: Family Medicine

## 2021-05-15 NOTE — Progress Notes (Addendum)
I spoke with patient again today to follow up if [facility] had called [facility]. Patient stated he found his Montelukast in the car on the passenger side. No further action needed.  Debbora Dus, CPP notified  Marijean Niemann, [place] Clinical Pharmacy Assistant [phone]

## 2021-05-18 DIAGNOSIS — I4891 Unspecified atrial fibrillation: Secondary | ICD-10-CM | POA: Diagnosis not present

## 2021-05-18 DIAGNOSIS — I2510 Atherosclerotic heart disease of native coronary artery without angina pectoris: Secondary | ICD-10-CM | POA: Diagnosis not present

## 2021-05-18 DIAGNOSIS — I4892 Unspecified atrial flutter: Secondary | ICD-10-CM | POA: Diagnosis not present

## 2021-05-25 ENCOUNTER — Other Ambulatory Visit: Payer: Self-pay | Admitting: Family Medicine

## 2021-06-09 DIAGNOSIS — G4733 Obstructive sleep apnea (adult) (pediatric): Secondary | ICD-10-CM | POA: Diagnosis not present

## 2021-06-12 ENCOUNTER — Other Ambulatory Visit: Payer: Self-pay | Admitting: Family Medicine

## 2021-06-15 ENCOUNTER — Telehealth: Payer: Medicare Other

## 2021-07-05 ENCOUNTER — Other Ambulatory Visit: Payer: Self-pay | Admitting: Family Medicine

## 2021-07-15 ENCOUNTER — Other Ambulatory Visit: Payer: Self-pay

## 2021-07-15 ENCOUNTER — Encounter: Payer: Self-pay | Admitting: Family Medicine

## 2021-07-15 ENCOUNTER — Other Ambulatory Visit (INDEPENDENT_AMBULATORY_CARE_PROVIDER_SITE_OTHER): Payer: Medicare Other

## 2021-07-15 ENCOUNTER — Other Ambulatory Visit: Payer: Self-pay | Admitting: Family Medicine

## 2021-07-15 DIAGNOSIS — E1169 Type 2 diabetes mellitus with other specified complication: Secondary | ICD-10-CM

## 2021-07-15 DIAGNOSIS — E538 Deficiency of other specified B group vitamins: Secondary | ICD-10-CM

## 2021-07-15 DIAGNOSIS — D509 Iron deficiency anemia, unspecified: Secondary | ICD-10-CM

## 2021-07-15 DIAGNOSIS — E559 Vitamin D deficiency, unspecified: Secondary | ICD-10-CM | POA: Diagnosis not present

## 2021-07-15 DIAGNOSIS — E785 Hyperlipidemia, unspecified: Secondary | ICD-10-CM

## 2021-07-15 DIAGNOSIS — I480 Paroxysmal atrial fibrillation: Secondary | ICD-10-CM | POA: Insufficient documentation

## 2021-07-15 DIAGNOSIS — I4891 Unspecified atrial fibrillation: Secondary | ICD-10-CM | POA: Insufficient documentation

## 2021-07-15 LAB — COMPREHENSIVE METABOLIC PANEL
ALT: 17 U/L (ref 0–53)
AST: 21 U/L (ref 0–37)
Albumin: 4.4 g/dL (ref 3.5–5.2)
Alkaline Phosphatase: 62 U/L (ref 39–117)
BUN: 14 mg/dL (ref 6–23)
CO2: 42 mEq/L — ABNORMAL HIGH (ref 19–32)
Calcium: 9.6 mg/dL (ref 8.4–10.5)
Chloride: 91 mEq/L — ABNORMAL LOW (ref 96–112)
Creatinine, Ser: 0.73 mg/dL (ref 0.40–1.50)
GFR: 95.26 mL/min (ref >60.00)
Glucose, Bld: 300 mg/dL — ABNORMAL HIGH (ref 70–99)
Potassium: 3.8 mEq/L (ref 3.5–5.1)
Sodium: 140 mEq/L (ref 135–145)
Total Bilirubin: 0.5 mg/dL (ref 0.2–1.2)
Total Protein: 6.5 g/dL (ref 6.0–8.3)

## 2021-07-15 LAB — CBC WITH DIFFERENTIAL/PLATELET
Basophils Absolute: 0.1 10*3/uL (ref 0.0–0.1)
Basophils Relative: 0.6 % (ref 0.0–3.0)
Eosinophils Absolute: 0.1 10*3/uL (ref 0.0–0.7)
Eosinophils Relative: 0.6 % (ref 0.0–5.0)
HCT: 35.2 % — ABNORMAL LOW (ref 39.0–52.0)
Hemoglobin: 10.3 g/dL — ABNORMAL LOW (ref 13.0–17.0)
Lymphocytes Relative: 14.9 % (ref 12.0–46.0)
Lymphs Abs: 1.5 10*3/uL (ref 0.7–4.0)
MCHC: 29.4 g/dL — ABNORMAL LOW (ref 30.0–36.0)
MCV: 69.8 fl — ABNORMAL LOW (ref 78.0–100.0)
Monocytes Absolute: 1.1 10*3/uL — ABNORMAL HIGH (ref 0.1–1.0)
Monocytes Relative: 11.1 % (ref 3.0–12.0)
Neutro Abs: 7.3 10*3/uL (ref 1.4–7.7)
Neutrophils Relative %: 72.8 % (ref 43.0–77.0)
Platelets: 206 10*3/uL (ref 150.0–400.0)
RBC: 5.04 Mil/uL (ref 4.22–5.81)
RDW: 18.4 % — ABNORMAL HIGH (ref 11.5–15.5)
WBC: 10.1 10*3/uL (ref 4.0–10.5)

## 2021-07-15 LAB — TSH: TSH: 2.81 u[IU]/mL (ref 0.35–5.50)

## 2021-07-15 LAB — LIPID PANEL
Cholesterol: 125 mg/dL (ref 0–200)
HDL: 36.5 mg/dL — ABNORMAL LOW (ref >39.00)
LDL Cholesterol: 60 mg/dL (ref 0–99)
NonHDL: 88.43
Total CHOL/HDL Ratio: 3
Triglycerides: 143 mg/dL (ref 0.0–149.0)
VLDL: 28.6 mg/dL (ref 0.0–40.0)

## 2021-07-15 LAB — HEMOGLOBIN A1C: Hgb A1c MFr Bld: 10.4 % — ABNORMAL HIGH (ref 4.6–6.5)

## 2021-07-15 LAB — MICROALBUMIN / CREATININE URINE RATIO
Creatinine,U: 88.8 mg/dL
Microalb Creat Ratio: 6.1 mg/g (ref 0.0–30.0)
Microalb, Ur: 5.4 mg/dL — ABNORMAL HIGH (ref 0.0–1.9)

## 2021-07-15 LAB — IBC PANEL
Iron: 17 ug/dL — ABNORMAL LOW (ref 42–165)
Saturation Ratios: 3.2 % — ABNORMAL LOW (ref 20.0–50.0)
TIBC: 526.4 ug/dL — ABNORMAL HIGH (ref 250.0–450.0)
Transferrin: 376 mg/dL — ABNORMAL HIGH (ref 212.0–360.0)

## 2021-07-15 LAB — FERRITIN: Ferritin: 6.4 ng/mL — ABNORMAL LOW (ref 22.0–322.0)

## 2021-07-15 LAB — VITAMIN D 25 HYDROXY (VIT D DEFICIENCY, FRACTURES): VITD: 44.82 ng/mL (ref 30.00–100.00)

## 2021-07-15 LAB — VITAMIN B12: Vitamin B-12: >1504 pg/mL — ABNORMAL HIGH (ref 211–911)

## 2021-07-17 ENCOUNTER — Telehealth: Payer: Self-pay

## 2021-07-17 ENCOUNTER — Other Ambulatory Visit: Payer: Self-pay

## 2021-07-17 NOTE — Telephone Encounter (Signed)
Left voicemail with call back number for patient to call to schedule annual LDCT  

## 2021-07-19 ENCOUNTER — Other Ambulatory Visit: Payer: Self-pay | Admitting: Family Medicine

## 2021-07-20 ENCOUNTER — Other Ambulatory Visit: Payer: Self-pay

## 2021-07-20 ENCOUNTER — Ambulatory Visit (INDEPENDENT_AMBULATORY_CARE_PROVIDER_SITE_OTHER): Payer: Medicare Other | Admitting: Family Medicine

## 2021-07-20 ENCOUNTER — Encounter: Payer: Self-pay | Admitting: Family Medicine

## 2021-07-20 ENCOUNTER — Ambulatory Visit (INDEPENDENT_AMBULATORY_CARE_PROVIDER_SITE_OTHER)
Admission: RE | Admit: 2021-07-20 | Discharge: 2021-07-20 | Disposition: A | Discharge status: Home / Self Care | Payer: Medicare Other | Source: Ambulatory Visit | Attending: Family Medicine | Admitting: Family Medicine

## 2021-07-20 VITALS — BP 136/82 | HR 80 | Temp 97.4°F | Ht 69.5 in | Wt 320.1 lb

## 2021-07-20 DIAGNOSIS — E785 Hyperlipidemia, unspecified: Secondary | ICD-10-CM

## 2021-07-20 DIAGNOSIS — I4891 Unspecified atrial fibrillation: Secondary | ICD-10-CM

## 2021-07-20 DIAGNOSIS — R0609 Other forms of dyspnea: Secondary | ICD-10-CM

## 2021-07-20 DIAGNOSIS — G4733 Obstructive sleep apnea (adult) (pediatric): Secondary | ICD-10-CM

## 2021-07-20 DIAGNOSIS — E538 Deficiency of other specified B group vitamins: Secondary | ICD-10-CM

## 2021-07-20 DIAGNOSIS — E1169 Type 2 diabetes mellitus with other specified complication: Secondary | ICD-10-CM

## 2021-07-20 DIAGNOSIS — D509 Iron deficiency anemia, unspecified: Secondary | ICD-10-CM | POA: Diagnosis not present

## 2021-07-20 DIAGNOSIS — R21 Rash and other nonspecific skin eruption: Secondary | ICD-10-CM | POA: Diagnosis not present

## 2021-07-20 DIAGNOSIS — J449 Chronic obstructive pulmonary disease, unspecified: Secondary | ICD-10-CM | POA: Diagnosis not present

## 2021-07-20 DIAGNOSIS — R1084 Generalized abdominal pain: Secondary | ICD-10-CM | POA: Diagnosis not present

## 2021-07-20 DIAGNOSIS — I5022 Chronic systolic (congestive) heart failure: Secondary | ICD-10-CM

## 2021-07-20 DIAGNOSIS — R600 Localized edema: Secondary | ICD-10-CM

## 2021-07-20 DIAGNOSIS — R0600 Dyspnea, unspecified: Secondary | ICD-10-CM | POA: Diagnosis not present

## 2021-07-20 DIAGNOSIS — I517 Cardiomegaly: Secondary | ICD-10-CM | POA: Diagnosis not present

## 2021-07-20 MED ORDER — DAPAGLIFLOZIN PROPANEDIOL 10 MG PO TABS: 10.0000 mg | ORAL_TABLET | Freq: Every day | ORAL | 6 refills | Status: DC

## 2021-07-20 MED ORDER — CITALOPRAM HYDROBROMIDE 10 MG PO TABS: 10.0000 mg | ORAL_TABLET | Freq: Every day | ORAL | 3 refills | Status: DC

## 2021-07-20 MED ORDER — TRIAMCINOLONE ACETONIDE 0.1 % EX CREA
1.0000 | TOPICAL_CREAM | Freq: Two times a day (BID) | CUTANEOUS | 0 refills | Status: DC
Start: 2021-07-20 — End: 2022-01-05

## 2021-07-20 NOTE — Patient Instructions (Addendum)
We did not do physical today.  ?Labs today.  ?Chest xray today.  ?We will order CT of abdomen and pelvis  ?Start using compression stockings regularly.  ?Continue lasix '40mg'$  2 tablets twice daily.  ?Price out farxiga new diabetes medicine that also is a water pill. Let us know if unaffordable.  ?Return to see Dr Nehemiah Massed for a follow up appointment.  ?

## 2021-07-20 NOTE — Progress Notes (Signed)
? ? Patient ID: [name], male    DOB: 17-Aug-1955, 66 y.o.   MRN: [id number] ? ?This visit was conducted in person. ? ?BP 136/82   Pulse 80   Temp (!) 97.4 ?F (36.3 ?C) (Temporal)   Ht 5' 9.5" (1.765 m)   Wt (!) 320 lb 2 oz (145.2 kg)   SpO2 93%   BMI 46.60 kg/m?   ? ?CC: AMW --> converted to acute visit  ?Subjective:  ? ?HPI: ?[name] is a 66 y.o. male presenting on 07/20/2021 for Medicare Wellness (C/o swelling, waist down.  Gained 20 lbs within 2 wks despite dieting.  Says abd feels firm. ) and Rash (C/o rash on bilateral lower legs.  Areas are red and itchy.  ) ? ? ?Did not see health advisor this year. ? ?Hearing Screening  ? 500Hz 1000Hz 2000Hz 4000Hz  ?Right ear 40 _0 ?Left ear 40 20 40 0  ? ?Vision Screening  ? Right eye Left eye Both eyes  ?Without correction 20/30 20/30 20/30  ?With correction     ?  ?[place] Office Visit from 07/20/2021 in [facility]  ?PHQ-2 Total Score 3  ? ?  ?  ? ?  07/20/2021  ?  3:29 PM 07/09/2020  ?  2:28 PM 12/15/2018  ?  9:27 AM 12/15/2018  ?  9:14 AM 01/05/2018  ? 11:03 AM  ?Fall Risk   ?Falls in the past year? 1 0 0 0 Yes  ?Number falls in past yr: 1 0   2 or more  ?Injury with Fall? 0 0   No  ?Risk Factor Category      High Fall Risk  ?Risk for fall due to :  Medication side effect     ?Follow up  Falls evaluation completed;Falls prevention discussed     ? ?Atrial fibrillation seeing cardiology Dr Nehemiah Massed - most recently started on multaq and eliquis 04/2021. He took multaq for 1 month with overall regular rhythm, ran out last night - refill is now unaffordable.  ? ?Almost 50 lb weight gain in the past year.  ?This is despite working on diet - has limited sweets, breads. Sugars remaining very high.  ?Marked distension of abdomen associated with tight suprapubic distension and discomfort associated with nausea.  ?Ongoing marked exertional dyspnea to <50 ft.  ?++ leg swelling, orthopnea - sleeps with 2-3 pillows - this is  chronic.  ?No chest pain/tightness, no new cough.  ?He continues lasix 63m bid and feels he voids well with this.  ?He just bought [employer] compression stockings he's planning to use.  ? ?COPD - he continues advair 1 puff BID and incruse elipta 1 puff daily.  ?He finds oxygen in a can helps his breathing.  ? ?OSA - doesn't use CPAP. Evaluated by ENT for inspire device - told OSA was too severe to qualify for this.  ? ?DM - continues metformin 1003mBID, glimepiride 53m100maily. Not on farxiga, unsure why.  ? ?Progressive IDA - oral iron was too constipating, even with stool softener. Denies blood in stool or urine.  ? ?Preventative -> deferred due to above acute concerns: ?COLONOSCOPY WITH PROPOFOL 05/22/2020 - TA, diverticulosis, descending colon ulcer biopsy WNL, int hem (WoAllen Norrisarren, MD)  ?EGD 04/2020 WNL - continues pantoprazole BID ? ?   ? ?Relevant past medical, surgical, family and social history reviewed and updated as indicated. Interim medical history since our last visit reviewed. ?  Allergies and medications reviewed and updated. ?Outpatient Medications Prior to Visit  ?Medication Sig Dispense Refill  ? ACCU-CHEK FASTCLIX LANCETS MISC Check blood sugar once daily and as instructed. Dx 250.00 100 each 3  ? albuterol (PROVENTIL) (2.5 MG/3ML) 0.083% nebulizer solution INHALE 3 ML BY NEBULIZATION EVERY 6 HOURS AS NEEDED FOR WHEEZING OR SHORTNESS OF BREATH 150 mL 0  ? albuterol (VENTOLIN HFA) 108 (90 Base) MCG/ACT inhaler INHALE 2 PUFFS BY MOUTH EVERY 6 HOURS AS NEEDED FOR WHEEZE OR SHORTNESS OF BREATH 8.5 each 0  ? Blood Glucose Monitoring Suppl (BLOOD GLUCOSE MONITOR SYSTEM) W/DEVICE KIT by Does not apply route. Use to check sugar once daily and as needed Dx: E11.9 **ONE TOUCH VERIO**    ? carvedilol (COREG) 6.25 MG tablet TAKE 1 TABLET BY MOUTH 2 TIMES DAILY WITH A MEAL. 180 tablet 0  ? Cholecalciferol (VITAMIN D) 50 MCG (2000 UT) CAPS Take 1 capsule (2,000 Units total) by mouth daily. 30 capsule   ?  cyclobenzaprine (FLEXERIL) 10 MG tablet TAKE 1/2 TO 1 TABLET BY MOUTH TWICE A DAY AS NEEDED FOR MUSCLE SPASMS(SEDATION PRECAUTIONS) 30 tablet 0  ? ELIQUIS 5 MG TABS tablet Take 5 mg by mouth 2 (two) times daily.    ? ferrous sulfate 325 (65 FE) MG tablet Take 1 tablet (325 mg total) by mouth daily with breakfast.    ? fluticasone (FLONASE) 50 MCG/ACT nasal spray SPRAY 2 SPRAYS INTO EACH NOSTRIL EVERY DAY 48 mL 1  ? fluticasone-salmeterol (WIXELA INHUB) 100-50 MCG/ACT AEPB Inhale 1 puff into the lungs 2 (two) times daily. 60 each 11  ? glimepiride (AMARYL) 4 MG tablet TAKE 1 TABLET BY MOUTH DAILY WITH BREAKFAST 90 tablet 3  ? hydrOXYzine (ATARAX) 50 MG tablet TAKE 1 TABLET (50 MG TOTAL) BY MOUTH 2 (TWO) TIMES DAILY AS NEEDED FOR ANXIETY. 180 tablet 1  ? ipratropium (ATROVENT HFA) 17 MCG/ACT inhaler Inhale 2 puffs into the lungs every 6 (six) hours as needed for wheezing. 1 each 12  ? losartan (COZAAR) 25 MG tablet TAKE 1 TABLET (25 MG TOTAL) BY MOUTH DAILY. 90 tablet 0  ? metFORMIN (GLUCOPHAGE) 1000 MG tablet TAKE 1 TABLET (1,000 MG TOTAL) BY MOUTH 2 (TWO) TIMES DAILY WITH A MEAL. 180 tablet 3  ? montelukast (SINGULAIR) 10 MG tablet TAKE 1 TABLET BY MOUTH EVERYDAY AT BEDTIME 90 tablet 3  ? MULTAQ 400 MG tablet Take 400 mg by mouth 2 (two) times daily.    ? ONETOUCH VERIO test strip USE AS INSTRUCTED TO CHECK THREE TIMES DAILY AND AS NEEDED. E11.8 250 strip 1  ? pantoprazole (PROTONIX) 40 MG tablet TAKE 1 TABLET (40 MG TOTAL) BY MOUTH 2 (TWO) TIMES DAILY BEFORE A MEAL. 180 tablet 3  ? Probiotic Product (PROBIOTIC DAILY PO) Take by mouth.    ? simvastatin (ZOCOR) 20 MG tablet TAKE 1 TABLET BY MOUTH EVERY DAY IN THE EVENING 90 tablet 0  ? umeclidinium bromide (INCRUSE ELLIPTA) 62.5 MCG/ACT AEPB Inhale 1 puff into the lungs daily. 30 each 11  ? citalopram (CELEXA) 10 MG tablet Take 1 tablet (10 mg total) by mouth daily. 90 tablet 3  ? Cyanocobalamin (B-12) 1000 MCG SUBL Place 1 tablet under the tongue daily.    ?  dapagliflozin propanediol (FARXIGA) 10 MG TABS tablet Take 1 tablet (10 mg total) by mouth daily before breakfast. 30 tablet 11  ? furosemide (LASIX) 40 MG tablet TAKE 1 TABLET BY MOUTH TWICE A DAY 180 tablet 0  ? furosemide (  LASIX) 40 MG tablet Take 2 tablets (80 mg total) by mouth 2 (two) times daily.    ? aspirin EC 81 MG tablet Take 81 mg by mouth daily.    ? ?No facility-administered medications prior to visit.  ?  ? ?Per HPI unless specifically indicated in ROS section below ?Review of Systems ? ?Objective:  ?BP 136/82   Pulse 80   Temp (!) 97.4 ?F (36.3 ?C) (Temporal)   Ht 5' 9.5" (1.765 m)   Wt (!) 320 lb 2 oz (145.2 kg)   SpO2 93%   BMI 46.60 kg/m?   ?Wt Readings from Last 3 Encounters:  ?07/20/21 (!) 320 lb 2 oz (145.2 kg)  ?04/03/21 288 lb 4 oz (130.7 kg)  ?08/13/20 277 lb 5 oz (125.8 kg)  ?  ?  ?Physical Exam ?Vitals and nursing note reviewed.  ?Constitutional:   ?   Appearance: He is obese. He is ill-appearing.  ?HENT:  ?   Mouth/Throat:  ?   Mouth: Mucous membranes are [name].  ?   Pharynx: Oropharynx is clear. No oropharyngeal exudate or posterior oropharyngeal erythema.  ?Eyes:  ?   Extraocular Movements: Extraocular movements intact.  ?   Pupils: Pupils are equal, round, and reactive to light.  ?Cardiovascular:  ?   Rate and Rhythm: Normal rate and regular rhythm.  ?   Pulses: Normal pulses.  ?   Heart sounds: Normal heart sounds. No murmur heard. ?Pulmonary:  ?   Effort: Pulmonary effort is normal. No respiratory distress.  ?   Breath sounds: Normal breath sounds. No wheezing, rhonchi or rales.  ?   Comments: Mild bibasilar crackles ?Abdominal:  ?   General: Abdomen is protuberant. Bowel sounds are normal. There is distension.  ?   Tenderness: There is abdominal tenderness in the suprapubic area. There is no right CVA tenderness, left CVA tenderness, guarding or rebound. Negative signs include Murphy's sign.  ?   Hernia: A hernia is present. Hernia is present in the umbilical area (Large).   ?Genitourinary: ?   Pubic Area: No rash.   ?   Comments: No significant scrotal edema ?Musculoskeletal:     ?   General: Tenderness present. No swelling.  ?   Right lower leg: Edema present.  ?   Left lower leg: Edema present.  ?

## 2021-07-21 DIAGNOSIS — R109 Unspecified abdominal pain: Secondary | ICD-10-CM | POA: Insufficient documentation

## 2021-07-21 LAB — BRAIN NATRIURETIC PEPTIDE: Pro B Natriuretic peptide (BNP): 153 pg/mL — ABNORMAL HIGH (ref 0.0–100.0)

## 2021-07-21 MED ORDER — VITAMIN B-12 1000 MCG PO TABS
1000.0000 ug | ORAL_TABLET | ORAL | Status: AC
Start: 2021-07-22 — End: ?

## 2021-07-21 NOTE — Assessment & Plan Note (Addendum)
Chronic, marked deterioration noted with A1c up to 10.4. ?He has not been taking [place], unsure why. ?Continue metformin and glimepiride we will refill Farxiga.  I asked him to let me know if unaffordable to apply for patient assistance program.  Discussed insulin use if [place] not accessible.  ?

## 2021-07-21 NOTE — Assessment & Plan Note (Addendum)
He reports taking Incruse Ellipta and Advair regularly.  He is also on montelukast daily.  Lungs clear today.  Doubt COPD exacerbation contributing to current symptomatology. ?

## 2021-07-21 NOTE — Assessment & Plan Note (Signed)
New atrial fibrillation noted in 04/2021.  Initially treated with Multaq per cardiology however that is now unaffordable.  He is also on Eliquis.  Recommend he call cardiology for follow-up on A-fib plan as well as further evaluation of marked edema and weight gain.  ?

## 2021-07-21 NOTE — Assessment & Plan Note (Addendum)
Marked, worsened iron deficiency anemia noted today despite regular oral iron replacement.  He had a reassuring endoscopy and colonoscopy 04/2020 however with worsening anemia will consider return to GI pending below work-up.  ?

## 2021-07-21 NOTE — Assessment & Plan Note (Signed)
He states he has been taking Lasix 80 mg twice daily since December 2022. ?Anticipate CHF exacerbation causing marked swelling, weight gain and exacerbating chronic shortness of breath.  He has also recently taken a course of Multaq which could exacerbate CHF. ?Check BNP today, results will determine diuretic change/dosing. ?

## 2021-07-21 NOTE — Assessment & Plan Note (Signed)
This is despite regular lasix '80mg'$  bid. Will likely need diuretic adjustment - consider metolazine. Check BNP today.  ?

## 2021-07-21 NOTE — Assessment & Plan Note (Signed)
Anticipate dry skin dermatitis exacerbated by worsened pedal edema.  Will recommend leg elevation, compression stockings, and triamcinolone steroid cream sent to pharmacy. ?

## 2021-07-21 NOTE — Assessment & Plan Note (Signed)
Recommend drop B12 to Monday Wednesday Friday dose ?

## 2021-07-21 NOTE — Assessment & Plan Note (Addendum)
Ongoing, severe.  Anticipate possible acute CHF exacerbation playing an affect in addition to COPD. He states he has been taking Lasix 80 mg twice daily. Check BNP today ?

## 2021-07-21 NOTE — Assessment & Plan Note (Addendum)
Marked abdominal pain, distention and swelling predominantly to the lower abdomen without evidence of inguinal hernias. Suspect ascites. Given marked distention, do recommend further evaluation with CT imaging for liver and intestinal; evaluation ?

## 2021-07-21 NOTE — Assessment & Plan Note (Signed)
Marked weight gain noted as per below. ?

## 2021-07-21 NOTE — Assessment & Plan Note (Signed)
Stable period on simvastatin 20 mg.  Continue ?The ASCVD Risk score (Arnett DK, et al., 2019) failed to calculate for the following reasons: ?  The valid total cholesterol range is 130 to 320 mg/dL  ?

## 2021-07-21 NOTE — Assessment & Plan Note (Signed)
Found to not be inspire candidate due to severity of sleep apnea.  He still has not been able to tolerate CPAP mask.  Will need to return to pulmonology once work-up complete for above conditions. ?

## 2021-07-22 ENCOUNTER — Telehealth: Payer: Self-pay

## 2021-07-22 ENCOUNTER — Other Ambulatory Visit: Payer: Self-pay | Admitting: Family Medicine

## 2021-07-22 MED ORDER — METOLAZONE 2.5 MG PO TABS: 2.5000 mg | ORAL_TABLET | ORAL | 0 refills | Status: DC

## 2021-07-22 NOTE — Telephone Encounter (Signed)
Pt rtn call and is aware of results.  (See Labs, Result Notes- 07/20/21) ?

## 2021-07-22 NOTE — Telephone Encounter (Signed)
Lvm asking pt to call back.  Need to relay results, Dr. Synthia Innocent message and schedule 2 wk f/u OV.  (See Labs, Result Notes- 07/20/21) ? ?Labs/Dr. Synthia Innocent msg: ?Pz notify BNP was mildly elevated. I recommend he try a second fluid pill in addition to his use of Lasix 80 mg twice a day.  I want him to take metolazone 1 tablet once a week for the next 2 weeks and update [place] with effect on swelling.  I also want him to take the [place] which I sent into the pharmacy-let us know if unaffordable to work on patient assistance program.  Schedule follow-up appointment in 2 weeks as discussed at office visit ?

## 2021-07-22 NOTE — Progress Notes (Signed)
? ? ?Chronic Care Management ?Pharmacy Assistant  ? ?Name: [name]  MRN: [id number] DOB: [birth year] ? ?Reason for Encounter: CCM Counsellor) ?  ?Medications: ?Outpatient Encounter Medications as of 07/22/2021  ?Medication Sig  ? ACCU-CHEK FASTCLIX LANCETS MISC Check blood sugar once daily and as instructed. Dx 250.00  ? albuterol (PROVENTIL) (2.5 MG/3ML) 0.083% nebulizer solution INHALE 3 ML BY NEBULIZATION EVERY 6 HOURS AS NEEDED FOR WHEEZING OR SHORTNESS OF BREATH  ? albuterol (VENTOLIN HFA) 108 (90 Base) MCG/ACT inhaler INHALE 2 PUFFS BY MOUTH EVERY 6 HOURS AS NEEDED FOR WHEEZE OR SHORTNESS OF BREATH  ? Blood Glucose Monitoring Suppl (BLOOD GLUCOSE MONITOR SYSTEM) W/DEVICE KIT by Does not apply route. Use to check sugar once daily and as needed Dx: E11.9 **ONE TOUCH VERIO**  ? carvedilol (COREG) 6.25 MG tablet TAKE 1 TABLET BY MOUTH 2 TIMES DAILY WITH A MEAL.  ? Cholecalciferol (VITAMIN D) 50 MCG (2000 UT) CAPS Take 1 capsule (2,000 Units total) by mouth daily.  ? citalopram (CELEXA) 10 MG tablet Take 1 tablet (10 mg total) by mouth daily.  ? cyclobenzaprine (FLEXERIL) 10 MG tablet TAKE 1/2 TO 1 TABLET BY MOUTH TWICE A DAY AS NEEDED FOR MUSCLE SPASMS(SEDATION PRECAUTIONS)  ? dapagliflozin propanediol (FARXIGA) 10 MG TABS tablet Take 1 tablet (10 mg total) by mouth daily before breakfast.  ? ELIQUIS 5 MG TABS tablet Take 5 mg by mouth 2 (two) times daily.  ? ferrous sulfate 325 (65 FE) MG tablet Take 1 tablet (325 mg total) by mouth daily with breakfast.  ? fluticasone (FLONASE) 50 MCG/ACT nasal spray SPRAY 2 SPRAYS INTO EACH NOSTRIL EVERY DAY  ? fluticasone-salmeterol (WIXELA INHUB) 100-50 MCG/ACT AEPB Inhale 1 puff into the lungs 2 (two) times daily.  ? furosemide (LASIX) 40 MG tablet Take 2 tablets (80 mg total) by mouth 2 (two) times daily.  ? glimepiride (AMARYL) 4 MG tablet TAKE 1 TABLET BY MOUTH DAILY WITH BREAKFAST  ? hydrOXYzine (ATARAX) 50 MG tablet TAKE 1 TABLET (50 MG TOTAL) BY MOUTH  2 (TWO) TIMES DAILY AS NEEDED FOR ANXIETY.  ? ipratropium (ATROVENT HFA) 17 MCG/ACT inhaler Inhale 2 puffs into the lungs every 6 (six) hours as needed for wheezing.  ? losartan (COZAAR) 25 MG tablet TAKE 1 TABLET (25 MG TOTAL) BY MOUTH DAILY.  ? metFORMIN (GLUCOPHAGE) 1000 MG tablet TAKE 1 TABLET (1,000 MG TOTAL) BY MOUTH 2 (TWO) TIMES DAILY WITH A MEAL.  ? metolazone (ZAROXOLYN) 2.5 MG tablet Take 1 tablet (2.5 mg total) by mouth once a week.  ? montelukast (SINGULAIR) 10 MG tablet TAKE 1 TABLET BY MOUTH EVERYDAY AT BEDTIME  ? MULTAQ 400 MG tablet Take 400 mg by mouth 2 (two) times daily.  ? ONETOUCH VERIO test strip USE AS INSTRUCTED TO CHECK THREE TIMES DAILY AND AS NEEDED. E11.8  ? pantoprazole (PROTONIX) 40 MG tablet TAKE 1 TABLET (40 MG TOTAL) BY MOUTH 2 (TWO) TIMES DAILY BEFORE A MEAL.  ? Probiotic Product (PROBIOTIC DAILY PO) Take by mouth.  ? simvastatin (ZOCOR) 20 MG tablet TAKE 1 TABLET BY MOUTH EVERY DAY IN THE EVENING  ? triamcinolone cream (KENALOG) 0.1 % Apply 1 application. topically 2 (two) times daily. Apply to AA.  ? umeclidinium bromide (INCRUSE ELLIPTA) 62.5 MCG/ACT AEPB Inhale 1 puff into the lungs daily.  ? vitamin B-12 (CYANOCOBALAMIN) 1000 MCG tablet Take 1 tablet (1,000 mcg total) by mouth every Monday, Wednesday, and Friday.  ? ?No facility-administered encounter medications on file as of 07/22/2021.  ? ?  [name] was contacted to remind of upcoming telephone visit with [name] on 04/03 at 8:45. Patient was reminded to have any blood glucose and blood pressure readings available for review at appointment.  ? ?Patient confirmed appointment. ? ?Are you having any problems with your medications? Yes  ? ?Do you have any concerns you like to discuss with the pharmacist? Yes Patient has a phone call in to Dr. Nehemiah Massed (Cardiology). He is on Multaq. He is having swelling in his legs and lower torso. He stated it is swelling so bad it is oozing like a bad sunburn. He also stated  he has gained 20 lbs in 2 weeks. Patient would like to know what else he can take due to side effects and cost is $200. There is a phone encounter for Cardiology on 07/21/2021. ? ?Star Rating Drugs: ?Medication:  Last Fill: Day Supply ?Losartan 25 mg 02/16/2021 90                    ?Simvastatin 20 mg 07/02/2021 90 ?Metformin 1000 mg 07/20/2021 90 ?Glimepiride 4 mg 05/29/2021 90 ?Farxiga 10 mg  07/20/2021 30 ? ?[name], CPP notified ? ?Marijean Niemann, RMA ?Clinical Pharmacy Assistant ?[phone] ? ?         ? ? ?

## 2021-07-27 ENCOUNTER — Telehealth: Payer: Self-pay

## 2021-07-27 ENCOUNTER — Telehealth: Payer: Medicare Other

## 2021-07-27 NOTE — Chronic Care Management (AMB) (Signed)
Contacted the patient per the pharmacist. Rescheduled telephone follow up appointment to 08/17/21 at 1:30PM.  ? ?[name], CPP notified ? ?Bruchy Mikel, CCMA ?Health concierge  ?[phone]  ?

## 2021-07-28 ENCOUNTER — Telehealth: Payer: Self-pay | Admitting: Family Medicine

## 2021-07-28 ENCOUNTER — Other Ambulatory Visit: Payer: Self-pay | Admitting: Family Medicine

## 2021-07-28 NOTE — Telephone Encounter (Signed)
Pt returning call about X-ray results . Would like a call back # [phone]  ?

## 2021-07-28 NOTE — Telephone Encounter (Signed)
Pt aware.  See Imaging, Result Notes- 07/20/21 ?

## 2021-07-29 DIAGNOSIS — I5022 Chronic systolic (congestive) heart failure: Secondary | ICD-10-CM | POA: Diagnosis not present

## 2021-07-29 DIAGNOSIS — I714 Abdominal aortic aneurysm, without rupture, unspecified: Secondary | ICD-10-CM | POA: Diagnosis not present

## 2021-07-29 DIAGNOSIS — G4733 Obstructive sleep apnea (adult) (pediatric): Secondary | ICD-10-CM | POA: Diagnosis not present

## 2021-07-29 DIAGNOSIS — I4892 Unspecified atrial flutter: Secondary | ICD-10-CM | POA: Diagnosis not present

## 2021-07-29 DIAGNOSIS — I4891 Unspecified atrial fibrillation: Secondary | ICD-10-CM | POA: Diagnosis not present

## 2021-07-29 DIAGNOSIS — I2510 Atherosclerotic heart disease of native coronary artery without angina pectoris: Secondary | ICD-10-CM | POA: Diagnosis not present

## 2021-08-04 ENCOUNTER — Ambulatory Visit (INDEPENDENT_AMBULATORY_CARE_PROVIDER_SITE_OTHER): Payer: Medicare Other | Admitting: Family Medicine

## 2021-08-04 ENCOUNTER — Encounter: Payer: Self-pay | Admitting: Family Medicine

## 2021-08-04 VITALS — BP 134/72 | HR 75 | Temp 97.9°F | Ht 69.5 in | Wt 305.2 lb

## 2021-08-04 DIAGNOSIS — L97911 Non-pressure chronic ulcer of unspecified part of right lower leg limited to breakdown of skin: Secondary | ICD-10-CM

## 2021-08-04 DIAGNOSIS — R600 Localized edema: Secondary | ICD-10-CM | POA: Diagnosis not present

## 2021-08-04 DIAGNOSIS — I5022 Chronic systolic (congestive) heart failure: Secondary | ICD-10-CM

## 2021-08-04 DIAGNOSIS — R0609 Other forms of dyspnea: Secondary | ICD-10-CM | POA: Diagnosis not present

## 2021-08-04 DIAGNOSIS — L989 Disorder of the skin and subcutaneous tissue, unspecified: Secondary | ICD-10-CM

## 2021-08-04 DIAGNOSIS — I4891 Unspecified atrial fibrillation: Secondary | ICD-10-CM

## 2021-08-04 DIAGNOSIS — R5381 Other malaise: Secondary | ICD-10-CM | POA: Diagnosis not present

## 2021-08-04 DIAGNOSIS — R5383 Other fatigue: Secondary | ICD-10-CM

## 2021-08-04 DIAGNOSIS — R1084 Generalized abdominal pain: Secondary | ICD-10-CM

## 2021-08-04 DIAGNOSIS — G4733 Obstructive sleep apnea (adult) (pediatric): Secondary | ICD-10-CM

## 2021-08-04 DIAGNOSIS — R21 Rash and other nonspecific skin eruption: Secondary | ICD-10-CM

## 2021-08-04 DIAGNOSIS — J449 Chronic obstructive pulmonary disease, unspecified: Secondary | ICD-10-CM

## 2021-08-04 DIAGNOSIS — D500 Iron deficiency anemia secondary to blood loss (chronic): Secondary | ICD-10-CM

## 2021-08-04 DIAGNOSIS — R0689 Other abnormalities of breathing: Secondary | ICD-10-CM

## 2021-08-04 DIAGNOSIS — E1169 Type 2 diabetes mellitus with other specified complication: Secondary | ICD-10-CM

## 2021-08-04 MED ORDER — CEPHALEXIN 500 MG PO CAPS: 500.0000 mg | ORAL_CAPSULE | Freq: Three times a day (TID) | ORAL | 0 refills | Status: DC

## 2021-08-04 NOTE — Patient Instructions (Addendum)
Labs today  ?Will see if you are eligible for farxiga assistance.  ?Unna boot placed today ?Return Monday at 12:00pm for wound check ?

## 2021-08-04 NOTE — Progress Notes (Signed)
? ? Patient ID: [name], male    DOB: 15-Nov-1955, 66 y.o.   MRN: [id number] ? ?This visit was conducted in person. ? ?BP 134/72   Pulse 75   Temp 97.9 ?F (36.6 ?C) (Temporal)   Ht 5' 9.5" (1.765 m)   Wt (!) 305 lb 4 oz (138.5 kg)   SpO2 95%   BMI 44.43 kg/m?   ? ?CC: 2 wk f/u visit, AMW ?Subjective:  ? ?HPI: ?[name] is a 66 y.o. male presenting on 08/04/2021 for Follow-up (Here for 2 wk f/u. ) ? ? ?Did not see health advisor this year.  ? ?See prior note for details. Seen here 2 weeks ago with marked weight gain (50 lbs in 1 year). We started metolazone once weekly in addition to his regular lasix 22m bid. I also restarted farxiga. 15 lb weight loss since metolazone started.   ? ?Known atrial fibrillation previously on multaq + eliquis.  ?Saw cardiology last week, note reviewed - afib restarted of multaq, so he was started on amiodarone 2054mbid (recommended only temporary) along with his carvedilol and eliquis, with plan for cardioversion so referred to EP.  ? ?Ongoing rash to lower extremities - associated with burning pain.  ? ?Marked diabetic deterioration with A1c up to 10.4. FaWilder Gladeas not affordable.  ? ?For marked lower abd distention and pain likely ascites related, contrasted CT ordered - pending.  ? ?OSA - not regularly using CPAP - unable to tolerate, finds he takes it off in his sleep. Not a candidate for Inspire. Has tried several different masks including nasal pillow.  ? ?COLONOSCOPY WITH PROPOFOL 05/22/2020 - TA, diverticulosis, descending colon ulcer biopsy WNL, int hem (WAllen NorrisDarren, MD)  ?EGD WNL - continues pantoprazole BID ?   ? ?Relevant past medical, surgical, family and social history reviewed and updated as indicated. Interim medical history since our last visit reviewed. ?Allergies and medications reviewed and updated. ?Outpatient Medications Prior to Visit  ?Medication Sig Dispense Refill  ? ACCU-CHEK FASTCLIX LANCETS MISC Check blood sugar once daily and as  instructed. Dx 250.00 100 each 3  ? albuterol (PROVENTIL) (2.5 MG/3ML) 0.083% nebulizer solution INHALE 3 ML BY NEBULIZATION EVERY 6 HOURS AS NEEDED FOR WHEEZING OR SHORTNESS OF BREATH 150 mL 0  ? albuterol (VENTOLIN HFA) 108 (90 Base) MCG/ACT inhaler INHALE 2 PUFFS BY MOUTH EVERY 6 HOURS AS NEEDED FOR WHEEZE OR SHORTNESS OF BREATH 8.5 each 0  ? Blood Glucose Monitoring Suppl (BLOOD GLUCOSE MONITOR SYSTEM) W/DEVICE KIT by Does not apply route. Use to check sugar once daily and as needed Dx: E11.9 **ONE TOUCH VERIO**    ? carvedilol (COREG) 6.25 MG tablet TAKE 1 TABLET BY MOUTH 2 TIMES DAILY WITH A MEAL. 180 tablet 0  ? Cholecalciferol (VITAMIN D) 50 MCG (2000 UT) CAPS Take 1 capsule (2,000 Units total) by mouth daily. 30 capsule   ? citalopram (CELEXA) 10 MG tablet Take 1 tablet (10 mg total) by mouth daily. 90 tablet 3  ? cyclobenzaprine (FLEXERIL) 10 MG tablet TAKE 1/2 TO 1 TABLET BY MOUTH TWICE A DAY AS NEEDED FOR MUSCLE SPASMS(SEDATION PRECAUTIONS) 30 tablet 0  ? ELIQUIS 5 MG TABS tablet Take 5 mg by mouth 2 (two) times daily.    ? ferrous sulfate 325 (65 FE) MG tablet Take 1 tablet (325 mg total) by mouth daily with breakfast.    ? fluticasone (FLONASE) 50 MCG/ACT nasal spray SPRAY 2 SPRAYS INTO EACH NOSTRIL EVERY DAY 48 mL  1  ? fluticasone-salmeterol (WIXELA INHUB) 100-50 MCG/ACT AEPB Inhale 1 puff into the lungs 2 (two) times daily. 60 each 11  ? furosemide (LASIX) 40 MG tablet Take 2 tablets (80 mg total) by mouth 2 (two) times daily.    ? glimepiride (AMARYL) 4 MG tablet TAKE 1 TABLET BY MOUTH DAILY WITH BREAKFAST 90 tablet 3  ? hydrOXYzine (ATARAX) 50 MG tablet TAKE 1 TABLET (50 MG TOTAL) BY MOUTH 2 (TWO) TIMES DAILY AS NEEDED FOR ANXIETY. 180 tablet 1  ? losartan (COZAAR) 25 MG tablet TAKE 1 TABLET (25 MG TOTAL) BY MOUTH DAILY. 90 tablet 0  ? metFORMIN (GLUCOPHAGE) 1000 MG tablet TAKE 1 TABLET (1,000 MG TOTAL) BY MOUTH 2 (TWO) TIMES DAILY WITH A MEAL. 180 tablet 3  ? metolazone (ZAROXOLYN) 2.5 MG tablet  Take 1 tablet (2.5 mg total) by mouth once a week. 4 tablet 0  ? montelukast (SINGULAIR) 10 MG tablet TAKE 1 TABLET BY MOUTH EVERYDAY AT BEDTIME 90 tablet 3  ? ONETOUCH VERIO test strip USE AS INSTRUCTED TO CHECK THREE TIMES DAILY AND AS NEEDED. E11.8 250 strip 1  ? pantoprazole (PROTONIX) 40 MG tablet TAKE 1 TABLET (40 MG TOTAL) BY MOUTH 2 (TWO) TIMES DAILY BEFORE A MEAL. 180 tablet 0  ? Probiotic Product (PROBIOTIC DAILY PO) Take by mouth.    ? simvastatin (ZOCOR) 20 MG tablet TAKE 1 TABLET BY MOUTH EVERY DAY IN THE EVENING 90 tablet 0  ? triamcinolone cream (KENALOG) 0.1 % Apply 1 application. topically 2 (two) times daily. Apply to AA. 80 g 0  ? umeclidinium bromide (INCRUSE ELLIPTA) 62.5 MCG/ACT AEPB Inhale 1 puff into the lungs daily. 30 each 11  ? vitamin B-12 (CYANOCOBALAMIN) 1000 MCG tablet Take 1 tablet (1,000 mcg total) by mouth every Monday, Wednesday, and Friday.    ? amiodarone (PACERONE) 200 MG tablet Take 200 mg by mouth 2 (two) times daily.    ? ipratropium (ATROVENT HFA) 17 MCG/ACT inhaler Inhale 2 puffs into the lungs every 6 (six) hours as needed for wheezing. 1 each 12  ? dapagliflozin propanediol (FARXIGA) 10 MG TABS tablet Take 1 tablet (10 mg total) by mouth daily before breakfast. 30 tablet 6  ? MULTAQ 400 MG tablet Take 400 mg by mouth 2 (two) times daily.    ? ?No facility-administered medications prior to visit.  ?  ? ?Per HPI unless specifically indicated in ROS section below ?Review of Systems ? ?Objective:  ?BP 134/72   Pulse 75   Temp 97.9 ?F (36.6 ?C) (Temporal)   Ht 5' 9.5" (1.765 m)   Wt (!) 305 lb 4 oz (138.5 kg)   SpO2 95%   BMI 44.43 kg/m?   ?Wt Readings from Last 3 Encounters:  ?08/04/21 (!) 305 lb 4 oz (138.5 kg)  ?07/20/21 (!) 320 lb 2 oz (145.2 kg)  ?04/03/21 288 lb 4 oz (130.7 kg)  ?  ?  ?Physical Exam ?Vitals and nursing note reviewed.  ?Constitutional:   ?   Appearance: Normal appearance. He is not ill-appearing.  ?Cardiovascular:  ?   Rate and Rhythm: Normal rate  and regular rhythm.  ?   Pulses: Normal pulses.  ?   Heart sounds: Normal heart sounds. No murmur heard. ?Pulmonary:  ?   Effort: Pulmonary effort is normal. No respiratory distress.  ?   Breath sounds: Normal breath sounds. No wheezing, rhonchi or rales.  ?Abdominal:  ?   General: There is distension.  ?   Palpations: There is  no mass.  ?   Tenderness: There is abdominal tenderness. There is no guarding or rebound.  ?   Hernia: A hernia is present.  ?Musculoskeletal:     ?   General: Swelling and tenderness present.  ?   Right lower leg: Edema present.  ?   Left lower leg: Edema present.  ?   Comments:  ?1+ DP bilaterally ?Shallow ulcer to posterior R calf with significant maceration, erythema, clear drainage  ?Skin: ?   General: Skin is warm and dry.  ?   Findings: Erythema, lesion and rash present. Rash is scaling.  ?   Comments: Scaling erythematous rash to bilateral lower extremities  ?Neurological:  ?   General: No focal deficit present.  ?   Mental Status: He is alert. Mental status is at baseline.  ?Psychiatric:     ?   Mood and Affect: Mood normal.     ?   Behavior: Behavior normal.  ? ?   ? ?R posterior leg: ? ? ?Results for orders placed or performed in visit on 08/04/21  ?WOUND CULTURE  ? Specimen: Blood  ?Result Value Ref Range  ? MICRO NUMBER: [id number]   ? SPECIMEN QUALITY: Adequate   ? SOURCE: WOUND (SITE NOT SPECIFIED)   ? STATUS: PRELIMINARY   ? GRAM STAIN:    ?  No white blood cells seen No epithelial cells seen No organisms seen  ? RESULT: Culture in progress   ?Renal function panel  ?Result Value Ref Range  ? Sodium 138 135 - 145 mEq/L  ? Potassium 3.9 3.5 - 5.1 mEq/L  ? Chloride 86 (L) 96 - 112 mEq/L  ? CO2 44 (H) 19 - 32 mEq/L  ? Albumin 4.3 3.5 - 5.2 g/dL  ? BUN 10 6 - 23 mg/dL  ? Creatinine, Ser 0.68 0.40 - 1.50 mg/dL  ? Glucose, Bld 225 (H) 70 - 99 mg/dL  ? Phosphorus 3.0 2.3 - 4.6 mg/dL  ? GFR 97.28 >60.00 mL/min  ? Calcium 9.6 8.4 - 10.5 mg/dL  ?Sedimentation rate  ?Result Value Ref Range   ? Sed Rate 34 (H) 0 - 20 mm/hr  ?CBC with Differential/Platelet  ?Result Value Ref Range  ? WBC 9.3 4.0 - 10.5 K/uL  ? RBC 4.76 4.22 - 5.81 Mil/uL  ? Hemoglobin 9.3 (L) 13.0 - 17.0 g/dL  ? HCT 32.1 (L) 39.0 - 52.0 %

## 2021-08-05 ENCOUNTER — Telehealth: Payer: Self-pay | Admitting: Family Medicine

## 2021-08-05 LAB — CBC WITH DIFFERENTIAL/PLATELET
Basophils Absolute: 0.1 10*3/uL (ref 0.0–0.1)
Basophils Relative: 0.9 % (ref 0.0–3.0)
Eosinophils Absolute: 0.1 10*3/uL (ref 0.0–0.7)
Eosinophils Relative: 1.4 % (ref 0.0–5.0)
HCT: 32.1 % — ABNORMAL LOW (ref 39.0–52.0)
Hemoglobin: 9.3 g/dL — ABNORMAL LOW (ref 13.0–17.0)
Lymphocytes Relative: 20.7 % (ref 12.0–46.0)
Lymphs Abs: 1.9 10*3/uL (ref 0.7–4.0)
MCHC: 29.1 g/dL — ABNORMAL LOW (ref 30.0–36.0)
MCV: 67.4 fl — ABNORMAL LOW (ref 78.0–100.0)
Monocytes Absolute: 1 10*3/uL (ref 0.1–1.0)
Monocytes Relative: 10.6 % (ref 3.0–12.0)
Neutro Abs: 6.1 10*3/uL (ref 1.4–7.7)
Neutrophils Relative %: 66.4 % (ref 43.0–77.0)
Platelets: 312 10*3/uL (ref 150.0–400.0)
RBC: 4.76 Mil/uL (ref 4.22–5.81)
RDW: 19 % — ABNORMAL HIGH (ref 11.5–15.5)
WBC: 9.3 10*3/uL (ref 4.0–10.5)

## 2021-08-05 LAB — RENAL FUNCTION PANEL
Albumin: 4.3 g/dL (ref 3.5–5.2)
BUN: 10 mg/dL (ref 6–23)
CO2: 44 mEq/L — ABNORMAL HIGH (ref 19–32)
Calcium: 9.6 mg/dL (ref 8.4–10.5)
Chloride: 86 mEq/L — ABNORMAL LOW (ref 96–112)
Creatinine, Ser: 0.68 mg/dL (ref 0.40–1.50)
GFR: 97.28 mL/min (ref >60.00)
Glucose, Bld: 225 mg/dL — ABNORMAL HIGH (ref 70–99)
Phosphorus: 3 mg/dL (ref 2.3–4.6)
Potassium: 3.9 mEq/L (ref 3.5–5.1)
Sodium: 138 mEq/L (ref 135–145)

## 2021-08-05 LAB — SEDIMENTATION RATE: Sed Rate: 34 mm/hr — ABNORMAL HIGH (ref 0–20)

## 2021-08-05 NOTE — Telephone Encounter (Signed)
I asked for this patient to be scheduled Mon at 12pm when he left office yesterday. This was not done. Please schedule for Monday at 12:15pm.  ?

## 2021-08-06 DIAGNOSIS — R0689 Other abnormalities of breathing: Secondary | ICD-10-CM | POA: Insufficient documentation

## 2021-08-06 DIAGNOSIS — L97911 Non-pressure chronic ulcer of unspecified part of right lower leg limited to breakdown of skin: Secondary | ICD-10-CM | POA: Insufficient documentation

## 2021-08-06 NOTE — Assessment & Plan Note (Signed)
Anticipate venous ulcer as pedal pulses stable. Place in [place] boot today.  ?With surrounding erythema, check wound culture and start keflex antibiotic  ?RTC 1 wk close f/u and unna boot replacement if needed.  ?Consider wound clinic referral depending on unna boot response.  ?

## 2021-08-06 NOTE — Assessment & Plan Note (Signed)
Worsening. May need iron infusion vs blood transfusion and return to GI for eval GI blood loss.  ?

## 2021-08-06 NOTE — Assessment & Plan Note (Signed)
Previously thought dry skin dermatitis now with worsening maceration and ulcer formation. See below.  ?

## 2021-08-06 NOTE — Assessment & Plan Note (Signed)
Severe, although improving since starting weekly metolazone. See below. RTC 1 wk unna boot replacement.  ?

## 2021-08-06 NOTE — Assessment & Plan Note (Signed)
Anticipate ascites related ?

## 2021-08-06 NOTE — Telephone Encounter (Signed)
Scheduled wound chk OV on 08/10/21 at 12:15.  Left message on vm per dpr notifying pt of appt.  ?

## 2021-08-06 NOTE — Assessment & Plan Note (Addendum)
15 lb weight loss since starting weekly metolazone, however previously had 50 lb weight gain in 1 year ?

## 2021-08-06 NOTE — Assessment & Plan Note (Addendum)
Seems to be doing better since metolazone weekly started. Continue lasix '80mg'$  bid - kidney function seems to be tolerating this well - update renal panel and CBC.  ?

## 2021-08-06 NOTE — Assessment & Plan Note (Signed)
Not Inspire candidate, unable to tolerate multiple tried CPAP masks.  ?Persistent untreated severe sleep apnea likely contributing to symptoms of dyspnea.  ?

## 2021-08-06 NOTE — Assessment & Plan Note (Signed)
Continue incruse ellipta and advair.  ?

## 2021-08-06 NOTE — Assessment & Plan Note (Signed)
On eliquis, started amiodarone pending ablation. Appreciate cards and EP care.  ?

## 2021-08-06 NOTE — Assessment & Plan Note (Signed)
Chronic, markedly elevated despite regular metformin and glimepiride use. Not interested in injectable therapies. Will ask care coordinator to help patient apply for Farxiga PAP.  ?

## 2021-08-06 NOTE — Assessment & Plan Note (Signed)
Appreciate cards care.  ?Continue lasix '80mg'$  bid with new metolazone 2.'5mg'$  weekly ?

## 2021-08-06 NOTE — Assessment & Plan Note (Addendum)
Worsening - see above.  ?

## 2021-08-08 LAB — WOUND CULTURE
MICRO NUMBER:: 13248026
SPECIMEN QUALITY:: ADEQUATE

## 2021-08-10 ENCOUNTER — Encounter: Payer: Self-pay | Admitting: Family Medicine

## 2021-08-10 ENCOUNTER — Ambulatory Visit (INDEPENDENT_AMBULATORY_CARE_PROVIDER_SITE_OTHER): Payer: Medicare Other | Admitting: Family Medicine

## 2021-08-10 VITALS — BP 124/66 | HR 72 | Temp 97.5°F | Ht 69.5 in | Wt 308.5 lb

## 2021-08-10 DIAGNOSIS — L97911 Non-pressure chronic ulcer of unspecified part of right lower leg limited to breakdown of skin: Secondary | ICD-10-CM

## 2021-08-10 DIAGNOSIS — R21 Rash and other nonspecific skin eruption: Secondary | ICD-10-CM

## 2021-08-10 DIAGNOSIS — I4891 Unspecified atrial fibrillation: Secondary | ICD-10-CM | POA: Diagnosis not present

## 2021-08-10 DIAGNOSIS — R0609 Other forms of dyspnea: Secondary | ICD-10-CM | POA: Diagnosis not present

## 2021-08-10 DIAGNOSIS — D500 Iron deficiency anemia secondary to blood loss (chronic): Secondary | ICD-10-CM

## 2021-08-10 DIAGNOSIS — G4733 Obstructive sleep apnea (adult) (pediatric): Secondary | ICD-10-CM | POA: Diagnosis not present

## 2021-08-10 DIAGNOSIS — I5022 Chronic systolic (congestive) heart failure: Secondary | ICD-10-CM | POA: Diagnosis not present

## 2021-08-10 DIAGNOSIS — R600 Localized edema: Secondary | ICD-10-CM

## 2021-08-10 MED ORDER — FUROSEMIDE 80 MG PO TABS: 80.0000 mg | ORAL_TABLET | Freq: Two times a day (BID) | ORAL | 3 refills | Status: DC

## 2021-08-10 MED ORDER — METOLAZONE 2.5 MG PO TABS: 2.5000 mg | ORAL_TABLET | ORAL | 1 refills | Status: DC

## 2021-08-10 NOTE — Progress Notes (Signed)
Pt called [facility] today with questions about his upcoming procedure, cardioversion, scheduled on Wednesday 4/19. Pt's questions were answered:  was notified by me to be NPO after midnight, to have a driver due to sedation (unable to drive for 24 hours). Pt was unaware and stated that he would have to find a driver. Told patient to reach out to his cardiologist regarding what medications to take pre-procedure and with any additional concerns or questions.  ?

## 2021-08-10 NOTE — Progress Notes (Signed)
? ? Patient ID: [name], male    DOB: November 02, 1955, 66 y.o.   MRN: [id number] ? ?This visit was conducted in person. ? ?BP 124/66   Pulse 72   Temp (!) 97.5 ?F (36.4 ?C) (Temporal)   Ht 5' 9.5" (1.765 m)   Wt (!) 308 lb 8 oz (139.9 kg)   SpO2 92%   BMI 44.90 kg/m?   ? ?CC: wound check ?Subjective:  ? ?HPI: ?[name] is a 66 y.o. male presenting on 08/10/2021 for Wound Check (Here for f/u of R leg ulcer. ) ? ? ?See prior note for details. Unna boot placed last week - fell off prior to getting home that day.  ?Recent labs reviewed - wound culture negative.  ?Progressive microcytic anemia with Hgb down to 9.3.  ?Progressive hypercapnea - still unable to tolerate CPAP mask.  ? ?He completed keflex course with some benefit but persistent drainage, sore to posterior right leg.  ?Unable to tolerate oral iron due to constipation.  ?   ? ?Relevant past medical, surgical, family and social history reviewed and updated as indicated. Interim medical history since our last visit reviewed. ?Allergies and medications reviewed and updated. ?Outpatient Medications Prior to Visit  ?Medication Sig Dispense Refill  ? ACCU-CHEK FASTCLIX LANCETS MISC Check blood sugar once daily and as instructed. Dx 250.00 100 each 3  ? albuterol (PROVENTIL) (2.5 MG/3ML) 0.083% nebulizer solution INHALE 3 ML BY NEBULIZATION EVERY 6 HOURS AS NEEDED FOR WHEEZING OR SHORTNESS OF BREATH (Patient taking differently: Take 2.5 mg by nebulization 2 (two) times daily.) 150 mL 0  ? albuterol (VENTOLIN HFA) 108 (90 Base) MCG/ACT inhaler INHALE 2 PUFFS BY MOUTH EVERY 6 HOURS AS NEEDED FOR WHEEZE OR SHORTNESS OF BREATH 8.5 each 0  ? amiodarone (PACERONE) 200 MG tablet Take 200 mg by mouth 2 (two) times daily.    ? Blood Glucose Monitoring Suppl (BLOOD GLUCOSE MONITOR SYSTEM) W/DEVICE KIT by Does not apply route. Use to check sugar once daily and as needed Dx: E11.9 **ONE TOUCH VERIO**    ? carvedilol (COREG) 6.25 MG tablet TAKE 1 TABLET BY MOUTH  2 TIMES DAILY WITH A MEAL. 180 tablet 0  ? cephALEXin (KEFLEX) 500 MG capsule Take 1 capsule (500 mg total) by mouth in the morning, at noon, and at bedtime. 21 capsule 0  ? Cholecalciferol (VITAMIN D) 50 MCG (2000 UT) CAPS Take 1 capsule (2,000 Units total) by mouth daily. (Patient taking differently: Take 2,000 Units by mouth daily.) 30 capsule   ? citalopram (CELEXA) 10 MG tablet Take 1 tablet (10 mg total) by mouth daily. 90 tablet 3  ? cyclobenzaprine (FLEXERIL) 10 MG tablet TAKE 1/2 TO 1 TABLET BY MOUTH TWICE A DAY AS NEEDED FOR MUSCLE SPASMS(SEDATION PRECAUTIONS) (Patient not taking: Reported on 08/10/2021) 30 tablet 0  ? ELIQUIS 5 MG TABS tablet Take 5 mg by mouth 2 (two) times daily.    ? ferrous sulfate 325 (65 FE) MG tablet Take 1 tablet (325 mg total) by mouth daily with breakfast.    ? fluticasone (FLONASE) 50 MCG/ACT nasal spray SPRAY 2 SPRAYS INTO EACH NOSTRIL EVERY DAY (Patient taking differently: Place 1 spray into both nostrils daily as needed for allergies.) 48 mL 1  ? fluticasone-salmeterol (WIXELA INHUB) 100-50 MCG/ACT AEPB Inhale 1 puff into the lungs 2 (two) times daily. 60 each 11  ? glimepiride (AMARYL) 4 MG tablet TAKE 1 TABLET BY MOUTH DAILY WITH BREAKFAST 90 tablet 3  ? hydrOXYzine (  ATARAX) 50 MG tablet TAKE 1 TABLET (50 MG TOTAL) BY MOUTH 2 (TWO) TIMES DAILY AS NEEDED FOR ANXIETY. 180 tablet 1  ? ipratropium (ATROVENT HFA) 17 MCG/ACT inhaler Inhale 2 puffs into the lungs every 6 (six) hours as needed for wheezing. (Patient not taking: Reported on 08/10/2021) 1 each 12  ? losartan (COZAAR) 25 MG tablet TAKE 1 TABLET (25 MG TOTAL) BY MOUTH DAILY. (Patient not taking: Reported on 08/10/2021) 90 tablet 0  ? metFORMIN (GLUCOPHAGE) 1000 MG tablet TAKE 1 TABLET (1,000 MG TOTAL) BY MOUTH 2 (TWO) TIMES DAILY WITH A MEAL. 180 tablet 3  ? montelukast (SINGULAIR) 10 MG tablet TAKE 1 TABLET BY MOUTH EVERYDAY AT BEDTIME 90 tablet 3  ? ONETOUCH VERIO test strip USE AS INSTRUCTED TO CHECK THREE TIMES DAILY  AND AS NEEDED. E11.8 250 strip 1  ? pantoprazole (PROTONIX) 40 MG tablet TAKE 1 TABLET (40 MG TOTAL) BY MOUTH 2 (TWO) TIMES DAILY BEFORE A MEAL. 180 tablet 0  ? Probiotic Product (PROBIOTIC DAILY PO) Take 1 tablet by mouth daily.    ? simvastatin (ZOCOR) 20 MG tablet TAKE 1 TABLET BY MOUTH EVERY DAY IN THE EVENING 90 tablet 0  ? triamcinolone cream (KENALOG) 0.1 % Apply 1 application. topically 2 (two) times daily. Apply to AA. (Patient taking differently: Apply 1 application. topically 3 (three) times daily. Apply to AA.) 80 g 0  ? umeclidinium bromide (INCRUSE ELLIPTA) 62.5 MCG/ACT AEPB Inhale 1 puff into the lungs daily. 30 each 11  ? vitamin B-12 (CYANOCOBALAMIN) 1000 MCG tablet Take 1 tablet (1,000 mcg total) by mouth every Monday, Wednesday, and Friday. (Patient taking differently: Take 1,000 mcg by mouth daily.)    ? furosemide (LASIX) 40 MG tablet Take 2 tablets (80 mg total) by mouth 2 (two) times daily.    ? metolazone (ZAROXOLYN) 2.5 MG tablet Take 1 tablet (2.5 mg total) by mouth once a week. 4 tablet 0  ? ?No facility-administered medications prior to visit.  ?  ? ?Per HPI unless specifically indicated in ROS section below ?Review of Systems ? ?Objective:  ?BP 124/66   Pulse 72   Temp (!) 97.5 ?F (36.4 ?C) (Temporal)   Ht 5' 9.5" (1.765 m)   Wt (!) 308 lb 8 oz (139.9 kg)   SpO2 92%   BMI 44.90 kg/m?   ?Wt Readings from Last 3 Encounters:  ?08/10/21 (!) 308 lb 8 oz (139.9 kg)  ?08/04/21 (!) 305 lb 4 oz (138.5 kg)  ?07/20/21 (!) 320 lb 2 oz (145.2 kg)  ?  ?  ?Physical Exam ?Vitals and nursing note reviewed.  ?Constitutional:   ?   Appearance: Normal appearance. He is not ill-appearing.  ?Musculoskeletal:     ?   General: Swelling present.  ?   Right lower leg: Edema present.  ?   Left lower leg: Edema present.  ?Skin: ?   General: Skin is warm.  ?   Findings: Erythema, lesion, rash and wound present.  ?   Comments: Persistent erythematous scaly rash to bilateral lower extremities, as well as  persistent macerated draining ulcer to posterior right leg  ?Neurological:  ?   Mental Status: He is alert.  ? ?   ?Results for orders placed or performed in visit on 08/04/21  ?WOUND CULTURE  ? Specimen: Blood  ?Result Value Ref Range  ? MICRO NUMBER: [id number]   ? SPECIMEN QUALITY: Adequate   ? SOURCE: LEG   ? STATUS: FINAL   ? GRAM STAIN:    ?  No white blood cells seen No epithelial cells seen No organisms seen  ? RESULT:    ?  A mix of organisms of questionable significance was recovered on culture and not further identified. (Note: Growth did not detect the presence of S.aureus, beta-hemolytic Streptococci or P.aeruginosa).  ?Renal function panel  ?Result Value Ref Range  ? Sodium 138 135 - 145 mEq/L  ? Potassium 3.9 3.5 - 5.1 mEq/L  ? Chloride 86 (L) 96 - 112 mEq/L  ? CO2 44 (H) 19 - 32 mEq/L  ? Albumin 4.3 3.5 - 5.2 g/dL  ? BUN 10 6 - 23 mg/dL  ? Creatinine, Ser 0.68 0.40 - 1.50 mg/dL  ? Glucose, Bld 225 (H) 70 - 99 mg/dL  ? Phosphorus 3.0 2.3 - 4.6 mg/dL  ? GFR 97.28 >60.00 mL/min  ? Calcium 9.6 8.4 - 10.5 mg/dL  ?Sedimentation rate  ?Result Value Ref Range  ? Sed Rate 34 (H) 0 - 20 mm/hr  ?CBC with Differential/Platelet  ?Result Value Ref Range  ? WBC 9.3 4.0 - 10.5 K/uL  ? RBC 4.76 4.22 - 5.81 Mil/uL  ? Hemoglobin 9.3 (L) 13.0 - 17.0 g/dL  ? HCT 32.1 (L) 39.0 - 52.0 %  ? MCV 67.4 Repeated and verified X2. (L) 78.0 - 100.0 fl  ? MCHC 29.1 (L) 30.0 - 36.0 g/dL  ? RDW 19.0 (H) 11.5 - 15.5 %  ? Platelets 312.0 150.0 - 400.0 K/uL  ? Neutrophils Relative % 66.4 43.0 - 77.0 %  ? Lymphocytes Relative 20.7 12.0 - 46.0 %  ? Monocytes Relative 10.6 3.0 - 12.0 %  ? Eosinophils Relative 1.4 0.0 - 5.0 %  ? Basophils Relative 0.9 0.0 - 3.0 %  ? Neutro Abs 6.1 1.4 - 7.7 K/uL  ? Lymphs Abs 1.9 0.7 - 4.0 K/uL  ? Monocytes Absolute 1.0 0.1 - 1.0 K/uL  ? Eosinophils Absolute 0.1 0.0 - 0.7 K/uL  ? Basophils Absolute 0.1 0.0 - 0.1 K/uL  ? ?Lab Results  ?Component Value Date  ? IRON 17 (L) 07/15/2021  ? TIBC 526.4 (H) 07/15/2021   ? FERRITIN 6.4 (L) 07/15/2021  ?  ?Assessment & Plan:  ? ?Problem List Items Addressed This Visit   ? ? OSA (obstructive sleep apnea)  ?  Severe untreated sleep apnea contributes to hypercapnia.  ?Discussed continue

## 2021-08-10 NOTE — Patient Instructions (Addendum)
Unna boot replaced today  ?We will refer you to wound center as well as blood doctor to discuss iron infusion and/or blood transfusion.  ?Continue elevating leg, continue water pills for now, increase metolazone to twice weekly.  ?

## 2021-08-11 NOTE — Assessment & Plan Note (Addendum)
Ongoing rash to lower extremities - anticipate related to chronic venous insufficiency changes. S/p keflex course with some benefit.  ?Continue TCI cream, but use triple abx ointment to open sore on right posterior leg.  ?

## 2021-08-11 NOTE — Assessment & Plan Note (Signed)
Persistent pedal edema and weight gain despite lasix '80mg'$  BID and metolazone 2.'5mg'$  once weekly. Kidney function remains stable - will increase metolazone to 2.'5mg'$  twice weekly. He does note significant benefit in urine output when he takes metolazone.  ?

## 2021-08-11 NOTE — Assessment & Plan Note (Signed)
This persists. S/p oral keflex course. Wound culture did not grow specific bacteria.  ?Unna boot that was placed last week fell off within the hour. He'd also been using steroid cream to wound.  ?Will replace RLE unna boot, discussed not placing steroid onto open sure but instead using triple abx ointment OTC if needed.  ?Will refer to wound clinic for poorly healing wound.  ?

## 2021-08-11 NOTE — Assessment & Plan Note (Signed)
Severe untreated sleep apnea contributes to hypercapnia.  ?Discussed continued efforts towards using CPAP at least some portion of the night, even if he removes it in his sleep.  ?

## 2021-08-11 NOTE — Assessment & Plan Note (Signed)
S/p Multaq.  ?Continues eliquis, recent amiodarone start, planned cardioversion later this week.  ?

## 2021-08-11 NOTE — Assessment & Plan Note (Signed)
Anticipate anemia contributes to his dyspnea - will refer to heme as per below.  ?

## 2021-08-11 NOTE — Assessment & Plan Note (Addendum)
Will send iFOB.  ?Refer to hematology in [place] to consider iron infusion vs blood transfusion.  ?May need to refer back to GI (last EGD/colonoscopy 04/2020 showing descending colon ulcer).  ?

## 2021-08-12 ENCOUNTER — Encounter
Admission: RE | Disposition: A | Discharge status: Home / Self Care | Payer: Self-pay | Source: Home / Self Care | Attending: Internal Medicine

## 2021-08-12 ENCOUNTER — Other Ambulatory Visit: Payer: Self-pay

## 2021-08-12 ENCOUNTER — Encounter: Payer: Self-pay | Admitting: Internal Medicine

## 2021-08-12 ENCOUNTER — Ambulatory Visit
Admission: RE | Admit: 2021-08-12 | Discharge: 2021-08-12 | Disposition: A | Discharge status: Home / Self Care | Payer: Medicare Other | Attending: Internal Medicine | Admitting: Internal Medicine

## 2021-08-12 DIAGNOSIS — G4733 Obstructive sleep apnea (adult) (pediatric): Secondary | ICD-10-CM | POA: Insufficient documentation

## 2021-08-12 DIAGNOSIS — I480 Paroxysmal atrial fibrillation: Secondary | ICD-10-CM | POA: Insufficient documentation

## 2021-08-12 DIAGNOSIS — Z7901 Long term (current) use of anticoagulants: Secondary | ICD-10-CM | POA: Diagnosis not present

## 2021-08-12 DIAGNOSIS — I4892 Unspecified atrial flutter: Secondary | ICD-10-CM | POA: Diagnosis not present

## 2021-08-12 DIAGNOSIS — I420 Dilated cardiomyopathy: Secondary | ICD-10-CM | POA: Insufficient documentation

## 2021-08-12 DIAGNOSIS — Z79899 Other long term (current) drug therapy: Secondary | ICD-10-CM | POA: Insufficient documentation

## 2021-08-12 DIAGNOSIS — I4891 Unspecified atrial fibrillation: Secondary | ICD-10-CM

## 2021-08-12 DIAGNOSIS — I5022 Chronic systolic (congestive) heart failure: Secondary | ICD-10-CM | POA: Diagnosis not present

## 2021-08-12 DIAGNOSIS — I2510 Atherosclerotic heart disease of native coronary artery without angina pectoris: Secondary | ICD-10-CM | POA: Insufficient documentation

## 2021-08-12 DIAGNOSIS — Z87891 Personal history of nicotine dependence: Secondary | ICD-10-CM | POA: Diagnosis not present

## 2021-08-12 HISTORY — PX: CARDIOVERSION: SHX1299

## 2021-08-12 SURGERY — CARDIOVERSION
Anesthesia: General

## 2021-08-12 MED ORDER — SODIUM CHLORIDE 0.9 % IV SOLN: INTRAVENOUS | Status: DC

## 2021-08-14 ENCOUNTER — Encounter: Payer: Self-pay | Admitting: Internal Medicine

## 2021-08-14 ENCOUNTER — Inpatient Hospital Stay: Payer: Medicare Other

## 2021-08-14 ENCOUNTER — Other Ambulatory Visit: Payer: Self-pay

## 2021-08-14 ENCOUNTER — Inpatient Hospital Stay: Payer: Medicare Other | Attending: Oncology | Admitting: Oncology

## 2021-08-14 VITALS — BP 118/59 | HR 73 | Temp 98.6°F | Resp 18 | Ht 70.0 in | Wt 299.6 lb

## 2021-08-14 DIAGNOSIS — R609 Edema, unspecified: Secondary | ICD-10-CM | POA: Diagnosis not present

## 2021-08-14 DIAGNOSIS — Z87891 Personal history of nicotine dependence: Secondary | ICD-10-CM | POA: Diagnosis not present

## 2021-08-14 DIAGNOSIS — D508 Other iron deficiency anemias: Secondary | ICD-10-CM | POA: Diagnosis not present

## 2021-08-14 DIAGNOSIS — Z79899 Other long term (current) drug therapy: Secondary | ICD-10-CM | POA: Diagnosis not present

## 2021-08-14 NOTE — Progress Notes (Signed)
? ?Hematology/Oncology Consult note ?[place] ?[phone] [phone] ? ?Patient Care Team: ?[name], MD as PCP - General (Family Medicine) ?Thompson Grayer, MD as Attending Physician (Cardiology) ?Corey Skains, MD as Referring Physician (Internal Medicine) ?Debbora Dus, [facility] as Pharmacist (Pharmacist)  ? ?Name of the patient: [name]  ?[id number]  ?[birth year]  ? ? ?Reason for referral-iron deficiency anemia ?  ?Referring physician-routine follow-up of iron deficiency anemia ? ?Date of visit: 08/14/21 ? ? ?History of presenting illness- Patient is a 66 year old male with a past medical history significant for coronary artery disease, fatty liver, COPD among other medical problems who has been referred to [place] for iron deficiency anemia.  His most recent CBC from 07/15/2021 showed H&H of 10.3/35.2 with an MCV of 69 with a normal white count and platelet count.  Hemoglobin had drifted down from 14 a year ago to 12.74 months ago to 10.3 presently.  Iron studies showed a low iron saturation of 3.2% and elevated TIBC with a ferritin level of 6.4.  B12 levels were elevated at 1500.  TSH normal at 2.8.  Patient has recently undergone EGD and colonoscopy by Dr. Allen Norris.  EGD showed no abnormality colonoscopy showed 4 mm ulcer in the descending colon which was biopsied and was negative for malignancy.  Patient reports tolerating oral iron poorly and constipation secondary to it.  He reports fatigue and exertional shortness of breath.  He has chronic lower extremity edema ? ?ECOG PS- 2 ? ?Pain scale- 0 ? ? ?Review of systems- Review of Systems  ?Constitutional:  Positive for malaise/fatigue. Negative for chills, fever and weight loss.  ?HENT:  Negative for congestion, ear discharge and nosebleeds.   ?Eyes:  Negative for blurred vision.  ?Respiratory:  Negative for cough, hemoptysis, sputum production, shortness of breath and wheezing.   ?Cardiovascular:  Positive  for leg swelling. Negative for chest pain, palpitations, orthopnea and claudication.  ?Gastrointestinal:  Negative for abdominal pain, blood in stool, constipation, diarrhea, heartburn, melena, nausea and vomiting.  ?Genitourinary:  Negative for dysuria, flank pain, frequency, hematuria and urgency.  ?Musculoskeletal:  Negative for back pain, joint pain and myalgias.  ?Skin:  Negative for rash.  ?Neurological:  Negative for dizziness, tingling, focal weakness, seizures, weakness and headaches.  ?Endo/Heme/Allergies:  Does not bruise/bleed easily.  ?Psychiatric/Behavioral:  Negative for depression and suicidal ideas. The patient does not have insomnia.   ? ?Allergies  ?Allergen Reactions  ? Atorvastatin Other (See Comments)  ?  Dizziness  ? Lisinopril Cough  ? ? ?Patient Active Problem List  ? Diagnosis Date Noted  ? Leg ulcer, right, limited to breakdown of skin ([place]) 08/06/2021  ? Hypercapnia 08/06/2021  ? Abdominal pain 07/21/2021  ? Atrial fibrillation ([place]) 07/15/2021  ? Vitreous floaters of left eye 06/05/2020  ? PLMD (periodic limb movement disorder) 06/05/2020  ? Ulceration of intestine   ? Pedal edema 02/25/2020  ? Neck pain on left side 02/25/2020  ? Mediastinal mass 01/05/2020  ? Skin rash 10/12/2019  ? Vitamin D deficiency 04/19/2019  ? Low serum vitamin B12 04/19/2019  ? Iron deficiency anemia 01/02/2019  ? Epigastric abdominal pain 09/19/2018  ? COPD (chronic obstructive pulmonary disease) ([place]) 07/25/2018  ? Chronic neck pain [facility] Area of Pain) (R>L) 01/05/2018  ? Chronic upper extremity pain ([place] Area of Pain) (R>L) 01/05/2018  ? Chronic upper back pain 01/05/2018  ? Chronic bilateral low back pain without sciatica 01/05/2018  ? Chronic pain syndrome 01/05/2018  ? Right temporal  headache 10/17/2017  ? Right leg weakness 07/14/2017  ? AAA (abdominal aortic aneurysm) without rupture ([place]) 09/15/2016  ? Adrenal adenoma, left 08/13/2016  ? Nodule of right lung 08/13/2016  ? Fatty liver 08/01/2016   ? Right shoulder pain 07/26/2016  ? Restrictive lung disease 06/04/2016  ? Chronic systolic CHF (congestive heart failure) ([place])   ? Tinea pedis 06/30/2015  ? Gassiness 02/21/2015  ? Advanced care planning/counseling discussion 10/16/2014  ? Encounter for general adult medical examination with abnormal findings 10/16/2014  ? Type 2 diabetes mellitus with other specified complication ([place]) 53/64/6803  ? Medicare annual wellness visit, subsequent 09/03/2013  ? Umbilical hernia without obstruction and without gangrene 09/03/2013  ? Left flank mass 09/03/2013  ? Hyperlipidemia associated with type 2 diabetes mellitus ([place]) 06/29/2013  ? Coronary artery disease 06/29/2013  ? Abnormal toxicological findings 06/24/2013  ? Exertional dyspnea 06/14/2013  ? Malaise and fatigue 03/14/2013  ? Hypertension   ? Panic attacks   ? History of atrial flutter 07/07/2011  ? Obesity, morbid, BMI 40.0-49.9 ([place]) 07/07/2011  ? Ex-smoker 08/13/2009  ? OSA (obstructive sleep apnea) 08/13/2009  ? DDD (degenerative disc disease), cervical 08/13/2009  ? Allergic rhinitis 06/24/2009  ? ? ? ?Past Medical History:  ?Diagnosis Date  ? Abnormal drug screen 2015  ? MJ positive x3 - one more and we will stop prescribing ativan (08/2013).  ? Angina   ? Anxiety   ? Arthritis   ? CAD (coronary artery disease)   ? nonobstructive  ? Cannabis abuse   ? Chronic systolic CHF (congestive heart failure) ([place]) 2013  ? NYHA Class II/III  ? COPD (chronic obstructive pulmonary disease) ([place])   ? Diabetes type 2, uncontrolled   ? declines DSME  ? Dilated cardiomyopathy ([place]) 2013  ? now improved  ? Ex-smoker   ? Frequent headaches   ? History of atrial fibrillation 2013  ? chronic, s/p ablation prior on coumadin  ? Hyperlipidemia   ? Migraine   ? Nonischemic cardiomyopathy ([place]) 2013  ? EF 25% per Dr Nehemiah Massed  ? Obesity   ? OSA (obstructive sleep apnea)   ? does not use CPAP - unable to tolerate  ? Seasonal allergies   ? ? ? ?Past Surgical History:  ?Procedure  Laterality Date  ? ATRIAL FLUTTER ABLATION N/A 09/21/2011  ? Procedure: ATRIAL FLUTTER ABLATION;  Surgeon: Thompson Grayer, MD;  Location: [facility] CATH LAB;  Service: Cardiovascular;  Laterality: N/A;  ? CARDIAC ELECTROPHYSIOLOGY [place] AND ABLATION  2013  ? for atrial flutter  ? CARDIOVASCULAR STRESS TEST  03/2012  ? ETT WNL Nehemiah Massed)  ? CARDIOVERSION N/A 08/12/2021  ? Procedure: CARDIOVERSION;  Surgeon: Corey Skains, MD;  Location: [facility] ORS;  Service: Cardiovascular;  Laterality: N/A;  ? COLONOSCOPY WITH PROPOFOL N/A 05/22/2020  ? TA, diverticulosis, descending colon ulcer biopsy WNL, int hem Allen Norris, Darren, MD)  ? ESOPHAGOGASTRODUODENOSCOPY (EGD) WITH PROPOFOL N/A 05/22/2020  ? WNL ([place])  ? US ECHOCARDIOGRAPHY  2014  ? EF 50%, nl LV fxn, RV nl size/function, mild mitral insuff  ? ? ?Social History  ? ?Socioeconomic History  ? Marital status: Single  ?  Spouse name: Not on file  ? Number of children: 0  ? Years of education: Not on file  ? Highest education level: Not on file  ?Occupational History  ? Occupation: disable   ?Tobacco Use  ? Smoking status: Former  ?  Packs/day: 1.00  ?  Years: 31.00  ?  Pack years: 31.00  ?  Types: Cigarettes  ?  Quit date: 04/26/2018  ?  Years since quitting: 3.3  ? Smokeless tobacco: Never  ?Vaping Use  ? Vaping Use: Never used  ?Substance and Sexual Activity  ? Alcohol use: No  ?  Alcohol/week: 0.0 standard drinks  ? Drug use: Yes  ?  Types: Marijuana  ?  Comment: 05/18/20  ? Sexual activity: Yes  ?Other Topics Concern  ? Not on file  ?Social History Narrative  ? Pt lives in [place]  ? Divorced  ? Occupation: Dealer  ? Disability due to neck arthritis, anxiety and sleep apnea  ? Edu: HS  ? Activity: no regular exercise, mows lawn (riding and push)  ? Diet: good water, fruits/vegetables daily  ? ?Social Determinants of Health  ? ?Financial Resource Strain: Not on file  ?Food Insecurity: Not on file  ?Transportation Needs: Not on file  ?Physical Activity: Not on file   ?Stress: Not on file  ?Social Connections: Not on file  ?Intimate Partner Violence: Not on file  ? ?  ?Family History  ?Problem Relation Age of Onset  ? Heart failure Father 20  ? Cancer Mother 52  ?     NHL  ?

## 2021-08-15 ENCOUNTER — Other Ambulatory Visit: Payer: Self-pay | Admitting: Family Medicine

## 2021-08-17 ENCOUNTER — Ambulatory Visit (INDEPENDENT_AMBULATORY_CARE_PROVIDER_SITE_OTHER): Payer: Medicare Other | Admitting: Family Medicine

## 2021-08-17 ENCOUNTER — Encounter: Payer: Self-pay | Admitting: Family Medicine

## 2021-08-17 ENCOUNTER — Telehealth: Payer: Medicare Other

## 2021-08-17 VITALS — BP 120/72 | HR 77 | Temp 97.5°F | Ht 69.5 in | Wt 302.1 lb

## 2021-08-17 DIAGNOSIS — E1169 Type 2 diabetes mellitus with other specified complication: Secondary | ICD-10-CM | POA: Diagnosis not present

## 2021-08-17 DIAGNOSIS — I4891 Unspecified atrial fibrillation: Secondary | ICD-10-CM | POA: Diagnosis not present

## 2021-08-17 DIAGNOSIS — D508 Other iron deficiency anemias: Secondary | ICD-10-CM | POA: Diagnosis not present

## 2021-08-17 DIAGNOSIS — L97911 Non-pressure chronic ulcer of unspecified part of right lower leg limited to breakdown of skin: Secondary | ICD-10-CM

## 2021-08-17 NOTE — Progress Notes (Signed)
? ? Patient ID: [name], male    DOB: July 15, 1955, 66 y.o.   MRN: [id number] ? ?This visit was conducted in person. ? ?BP 120/72   Pulse 77   Temp (!) 97.5 ?F (36.4 ?C) (Temporal)   Ht 5' 9.5" (1.765 m)   Wt (!) 302 lb 2 oz (137 kg)   SpO2 96%   BMI 43.98 kg/m?   ? ?CC: f/u wound ?Subjective:  ? ?HPI: ?[name] is a 66 y.o. male presenting on 08/17/2021 for Wound Check (Here for 1 wk f/u.) ? ? ?See prior notes for details.  ?Unna boot placed last visit, lasted 1 d before it over-saturated and fell off.  ?He does dressing changes 3 times a day at home - cleaning skin with OTC handiwipe then dresses with gauze and coban wrap.  ?Upcoming wound clinic appt next week.  ? ?Afib - did not need to undergo cardioversion - was in NSR when checked.  ? ?Also established with hematology Dr Janese Banks for progressive microcytic anemia, pending iron infusions (Feraheme). Unable to tolerate oral iron.  ?   ? ?Relevant past medical, surgical, family and social history reviewed and updated as indicated. Interim medical history since our last visit reviewed. ?Allergies and medications reviewed and updated. ?Outpatient Medications Prior to Visit  ?Medication Sig Dispense Refill  ? ACCU-CHEK FASTCLIX LANCETS MISC Check blood sugar once daily and as instructed. Dx 250.00 100 each 3  ? albuterol (PROVENTIL) (2.5 MG/3ML) 0.083% nebulizer solution INHALE 3 ML BY NEBULIZATION EVERY 6 HOURS AS NEEDED FOR WHEEZING OR SHORTNESS OF BREATH (Patient taking differently: Take 2.5 mg by nebulization 2 (two) times daily.) 150 mL 0  ? albuterol (VENTOLIN HFA) 108 (90 Base) MCG/ACT inhaler INHALE 2 PUFFS BY MOUTH EVERY 6 HOURS AS NEEDED FOR WHEEZE OR SHORTNESS OF BREATH 8.5 each 0  ? amiodarone (PACERONE) 200 MG tablet Take 200 mg by mouth 2 (two) times daily.    ? Blood Glucose Monitoring Suppl (BLOOD GLUCOSE MONITOR SYSTEM) W/DEVICE KIT by Does not apply route. Use to check sugar once daily and as needed Dx: E11.9 **ONE TOUCH VERIO**     ? carvedilol (COREG) 6.25 MG tablet TAKE 1 TABLET BY MOUTH 2 TIMES DAILY WITH A MEAL. 180 tablet 0  ? Cholecalciferol (VITAMIN D) 50 MCG (2000 UT) CAPS Take 1 capsule (2,000 Units total) by mouth daily. (Patient taking differently: Take 2,000 Units by mouth daily.) 30 capsule   ? citalopram (CELEXA) 10 MG tablet Take 1 tablet (10 mg total) by mouth daily. 90 tablet 3  ? Docusate Calcium (STOOL SOFTENER PO) Take 1 tablet by mouth daily.    ? ELIQUIS 5 MG TABS tablet Take 5 mg by mouth 2 (two) times daily.    ? ferrous sulfate 325 (65 FE) MG tablet Take 1 tablet (325 mg total) by mouth daily with breakfast.    ? fluticasone (FLONASE) 50 MCG/ACT nasal spray SPRAY 2 SPRAYS INTO EACH NOSTRIL EVERY DAY (Patient taking differently: Place 1 spray into both nostrils daily as needed for allergies.) 48 mL 1  ? fluticasone-salmeterol (WIXELA INHUB) 100-50 MCG/ACT AEPB Inhale 1 puff into the lungs 2 (two) times daily. 60 each 11  ? furosemide (LASIX) 80 MG tablet Take 1 tablet (80 mg total) by mouth 2 (two) times daily. 60 tablet 3  ? glimepiride (AMARYL) 4 MG tablet TAKE 1 TABLET BY MOUTH DAILY WITH BREAKFAST 90 tablet 3  ? hydrOXYzine (ATARAX) 50 MG tablet TAKE 1 TABLET (50  MG TOTAL) BY MOUTH 2 (TWO) TIMES DAILY AS NEEDED FOR ANXIETY. 180 tablet 1  ? ipratropium (ATROVENT HFA) 17 MCG/ACT inhaler Inhale 2 puffs into the lungs every 6 (six) hours as needed for wheezing. 1 each 12  ? losartan (COZAAR) 25 MG tablet TAKE 1 TABLET (25 MG TOTAL) BY MOUTH DAILY. 90 tablet 0  ? metFORMIN (GLUCOPHAGE) 1000 MG tablet TAKE 1 TABLET (1,000 MG TOTAL) BY MOUTH 2 (TWO) TIMES DAILY WITH A MEAL. 180 tablet 3  ? metolazone (ZAROXOLYN) 2.5 MG tablet Take 1 tablet (2.5 mg total) by mouth 2 (two) times a week. 8 tablet 1  ? montelukast (SINGULAIR) 10 MG tablet TAKE 1 TABLET BY MOUTH EVERYDAY AT BEDTIME 90 tablet 3  ? NON FORMULARY 1,200 mg daily. SEA MOSS    ? NON FORMULARY 2,000 mg daily. CINAMMON    ? NON FORMULARY daily. SUPER BEETS    ? NON  FORMULARY 500 mg once. TURMERIC    ? ONETOUCH VERIO test strip USE AS INSTRUCTED TO CHECK THREE TIMES DAILY AND AS NEEDED. E11.8 250 strip 1  ? pantoprazole (PROTONIX) 40 MG tablet TAKE 1 TABLET (40 MG TOTAL) BY MOUTH 2 (TWO) TIMES DAILY BEFORE A MEAL. 180 tablet 0  ? Probiotic Product (PROBIOTIC DAILY PO) Take 1 tablet by mouth daily.    ? simvastatin (ZOCOR) 20 MG tablet TAKE 1 TABLET BY MOUTH EVERY DAY IN THE EVENING 90 tablet 0  ? triamcinolone cream (KENALOG) 0.1 % Apply 1 application. topically 2 (two) times daily. Apply to AA. (Patient taking differently: Apply 1 application. topically 3 (three) times daily. Apply to AA.) 80 g 0  ? umeclidinium bromide (INCRUSE ELLIPTA) 62.5 MCG/ACT AEPB Inhale 1 puff into the lungs daily. 30 each 11  ? vitamin B-12 (CYANOCOBALAMIN) 1000 MCG tablet Take 1 tablet (1,000 mcg total) by mouth every Monday, Wednesday, and Friday. (Patient taking differently: Take 1,000 mcg by mouth daily.)    ? cyclobenzaprine (FLEXERIL) 10 MG tablet TAKE 1/2 TO 1 TABLET BY MOUTH TWICE A DAY AS NEEDED FOR MUSCLE SPASMS(SEDATION PRECAUTIONS) (Patient not taking: Reported on 08/14/2021) 30 tablet 0  ? ?No facility-administered medications prior to visit.  ?  ? ?Per HPI unless specifically indicated in ROS section below ?Review of Systems ? ?Objective:  ?BP 120/72   Pulse 77   Temp (!) 97.5 ?F (36.4 ?C) (Temporal)   Ht 5' 9.5" (1.765 m)   Wt (!) 302 lb 2 oz (137 kg)   SpO2 96%   BMI 43.98 kg/m?   ?Wt Readings from Last 3 Encounters:  ?08/17/21 (!) 302 lb 2 oz (137 kg)  ?08/14/21 299 lb 9.6 oz (135.9 kg)  ?08/12/21 (!) 305 lb (138.3 kg)  ?  ?  ?Physical Exam ?   ?Results for orders placed or performed in visit on 08/04/21  ?WOUND CULTURE  ? Specimen: Blood  ?Result Value Ref Range  ? MICRO NUMBER: [id number]   ? SPECIMEN QUALITY: Adequate   ? SOURCE: LEG   ? STATUS: FINAL   ? GRAM STAIN:    ?  No white blood cells seen No epithelial cells seen No organisms seen  ? RESULT:    ?  A mix of organisms  of questionable significance was recovered on culture and not further identified. (Note: Growth did not detect the presence of S.aureus, beta-hemolytic Streptococci or P.aeruginosa).  ?Renal function panel  ?Result Value Ref Range  ? Sodium 138 135 - 145 mEq/L  ? Potassium 3.9 3.5 -  5.1 mEq/L  ? Chloride 86 (L) 96 - 112 mEq/L  ? CO2 44 (H) 19 - 32 mEq/L  ? Albumin 4.3 3.5 - 5.2 g/dL  ? BUN 10 6 - 23 mg/dL  ? Creatinine, Ser 0.68 0.40 - 1.50 mg/dL  ? Glucose, Bld 225 (H) 70 - 99 mg/dL  ? Phosphorus 3.0 2.3 - 4.6 mg/dL  ? GFR 97.28 >60.00 mL/min  ? Calcium 9.6 8.4 - 10.5 mg/dL  ?Sedimentation rate  ?Result Value Ref Range  ? Sed Rate 34 (H) 0 - 20 mm/hr  ?CBC with Differential/Platelet  ?Result Value Ref Range  ? WBC 9.3 4.0 - 10.5 K/uL  ? RBC 4.76 4.22 - 5.81 Mil/uL  ? Hemoglobin 9.3 (L) 13.0 - 17.0 g/dL  ? HCT 32.1 (L) 39.0 - 52.0 %  ? MCV 67.4 Repeated and verified X2. (L) 78.0 - 100.0 fl  ? MCHC 29.1 (L) 30.0 - 36.0 g/dL  ? RDW 19.0 (H) 11.5 - 15.5 %  ? Platelets 312.0 150.0 - 400.0 K/uL  ? Neutrophils Relative % 66.4 43.0 - 77.0 %  ? Lymphocytes Relative 20.7 12.0 - 46.0 %  ? Monocytes Relative 10.6 3.0 - 12.0 %  ? Eosinophils Relative 1.4 0.0 - 5.0 %  ? Basophils Relative 0.9 0.0 - 3.0 %  ? Neutro Abs 6.1 1.4 - 7.7 K/uL  ? Lymphs Abs 1.9 0.7 - 4.0 K/uL  ? Monocytes Absolute 1.0 0.1 - 1.0 K/uL  ? Eosinophils Absolute 0.1 0.0 - 0.7 K/uL  ? Basophils Absolute 0.1 0.0 - 0.1 K/uL  ? ?Lab Results  ?Component Value Date  ? HGBA1C 10.4 (H) 07/15/2021  ?  ?Assessment & Plan:  ? ?Problem List Items Addressed This Visit   ? ? Type 2 diabetes mellitus with other specified complication ([place])  ?  Notes home cbg's are improving. Not yet due for rpt A1c.  ?Wilder Glade PAP (care coordinator referral) pending.  ? ?  ?  ? Iron deficiency anemia  ?  Appreciate heme care - planned Feraheme infusion x2 in May.  ?Oral iron intolerance.  ? ?  ?  ? Atrial fibrillation ([place])  ?  Most recently staying in NSR.  ?He will run out of eliquis  shortly - advised f/u with cards for refill.  ? ?  ?  ? Leg ulcer, right, limited to breakdown of skin ([place]) - Primary  ?  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 Unna boot not effective. Will continue home d

## 2021-08-17 NOTE — Assessment & Plan Note (Signed)
Appreciate heme care - planned Feraheme infusion x2 in May.  ?Oral iron intolerance.  ?

## 2021-08-17 NOTE — Patient Instructions (Addendum)
Right posterior leg wounds rewrapped today.  ?Keep upcoming iron infusion and next week's wound clinic appointment.  ?Retry CPAP machine nightly.  ?Schedule physical 1-2 months from now.  ?

## 2021-08-17 NOTE — Assessment & Plan Note (Addendum)
Notes home cbg's are improving. Not yet due for rpt A1c.  ?Wilder Glade PAP (care coordinator referral) pending.  ?

## 2021-08-17 NOTE — Assessment & Plan Note (Addendum)
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 Unna boot not effective. Will continue home dressing changes several times a day, I asked him to ensure handiwipes don't have alcohol. He has wound clinic appt scheduled for next week.  ?Will appreciate wound clinic assistance. ?Discussed leg elevation, limit salt/sodium intake.  ?

## 2021-08-17 NOTE — Assessment & Plan Note (Signed)
Most recently staying in NSR.  ?He will run out of eliquis shortly - advised f/u with cards for refill.  ?

## 2021-08-19 ENCOUNTER — Other Ambulatory Visit: Payer: Medicare Other

## 2021-08-19 ENCOUNTER — Telehealth: Payer: Self-pay

## 2021-08-19 DIAGNOSIS — D509 Iron deficiency anemia, unspecified: Secondary | ICD-10-CM

## 2021-08-19 NOTE — Telephone Encounter (Signed)
Pt schedule for May 15th ?

## 2021-08-19 NOTE — Telephone Encounter (Signed)
-----   Message from Lucilla Lame, MD sent at 08/14/2021  3:48 PM EDT ----- ?This patient had an EGD and colonoscopy without a source found to explain the anemia.  Can you please set him up for a capsule endoscopy with a diagnosis of iron deficiency anemia. ?----- Message ----- ?From: Sindy Guadeloupe, MD ?Sent: 08/14/2021   2:48 PM EDT ?To: [name], MD, Lucilla Lame, MD ? ? ?

## 2021-08-20 ENCOUNTER — Telehealth: Payer: Self-pay

## 2021-08-20 ENCOUNTER — Telehealth: Payer: Self-pay | Admitting: *Deleted

## 2021-08-20 ENCOUNTER — Other Ambulatory Visit: Payer: Self-pay | Admitting: *Deleted

## 2021-08-20 DIAGNOSIS — Z122 Encounter for screening for malignant neoplasm of respiratory organs: Secondary | ICD-10-CM

## 2021-08-20 DIAGNOSIS — Z87891 Personal history of nicotine dependence: Secondary | ICD-10-CM

## 2021-08-20 NOTE — Telephone Encounter (Signed)
Reached out to pulmonary to check on the status for pts lung screen referral. Left a VM for Langley Gauss to return my call or schedule pt. I will be on the lookout for the appt.  ?

## 2021-08-20 NOTE — Telephone Encounter (Signed)
Left message for patient to call back to schedule follow up lung cancer screening CT scan.  

## 2021-08-20 NOTE — Telephone Encounter (Signed)
Adrianna RN with access nurse called and pt has worsening area with wound on rt leg;wound is draining pus that is wetting pts sock; pain level is 10 and is difficult for pt to walk. Pt last seen 08/17/21 and has a NP appt with wound care on 08/24/21 but unable to reach wound care to see if pt could be seen earlier. Adrianna disposition was ED but pt refused. I spoke with pt; and he said the wound on rt leg is oozing purulent drainage that is soaking his sock; pt pain level now is 10, pt said it is hard to walk and new today is it feels like a blow torch is at back of his leg.no fever. Pt was last seen 08/17/21 and has NP appt with wound care on 08/24/21. Unable reach wound care on phone to see if pt could be seen sooner. Pt continues to refuse ED because pt does not feel like he can drive and refuses [address] and needs someone to care for his pets. Dr Diona Browner agreed to see pt this afternoon at 3pm and when I advised pt he said he cannot come for appt due to the above reasons. Pt said he was going to watch the area and if worsened he would make arrangements and go to ED. I advised pt he was worse and needs to go to ED. Pt said he appreciated all we were trying to do but will wait and see how he does. Sending note to Dr Diona Browner with thanks and Dr Danise Mina who is out of office as FYI to PCP.  ? ? ?[facility] - Client ?TELEPHONE ADVICE RECORD ?AccessNurse? ?Patient ?Name: ?[name] ?HNURR ?Gender: Male ?DOB: November 22, 1955 ?Age: 66 Y 48 M 6 D ?Return ?Phone ?Number: ?[id number] ?(Primary) ?Address: ?City/ ?State/ ?Zip: ?[place] ? [address] ?Client [facility] - Client ?Client Site [place] - Day ?Provider [name] - MD ?Contact Type Call ?Who Is Calling Patient / Member / Family / Caregiver ?Call Type Triage / Clinical ?Relationship To Patient Self ?Return Phone Number [phone] (Primary) ?Chief Complaint Leg Pain ?Reason for Call  Symptomatic / Request for Health Information ?Initial Comment Caller states his doctor has been treating him for ?a right leg wound and its oozing and he is in pain. ?Caller states the pain is radiating from ankle to hip. ?Translation No ?Nurse Assessment ?Nurse: Altamease Oiler, RN, Adriana Date/Time (Eastern Time): 08/20/2021 11:37:28 AM ?Confirm and document reason for call. If ?symptomatic, describe symptoms. ?---pt states that he has been treating a wound. he has ?a lot of pain. has cream but it is not helping with the ?pain. pain started 2 days also, didn't sleep. wound is ?oozing 10/10 ?Does the patient have any new or worsening ?symptoms? ---Yes ?Will a triage be completed? ---Yes ?Related visit to physician within the last 2 weeks? ---Yes ?Does the PT have any chronic conditions? (i.e. ?diabetes, asthma, this includes High risk factors for ?pregnancy, etc.) ?---Yes ?List chronic conditions. ---high cholesterol diabetes copd ?Is this a behavioral health or substance abuse call? ---No ?Guidelines ?Guideline Title Affirmed Question Affirmed Notes Nurse Date/Time (Eastern ?Time) ?Wound Infection SEVERE pain in the ?wound ?Altamease Oiler, [place], Camilla 08/20/2021 11:41:16 ?AM ?Disp. Time (Eastern ?Time) Disposition Final User ?08/20/2021 11:44:31 AM Go to ED Now Yes Altamease Oiler, RN, Adriana ?PLEASE NOTE: All timestamps contained within this report are represented as [place] Standard Time. ?CONFIDENTIALTY NOTICE: This fax transmission is intended only for the  addressee. It contains information that is legally privileged, confidential or ?otherwise protected from use or disclosure. If you are not the intended recipient, you are strictly prohibited from reviewing, disclosing, copying using ?or disseminating any of this information or taking any action in reliance on or regarding this information. If you have received this fax in error, please ?notify us immediately by telephone so that we can arrange for its return to [place]. Phone: [phone],  Toll-Free: [phone], Fax: [phone] ?Page: 2 of 2 ?Call Id: [id number] ?Caller Disagree/Comply Disagree ?Caller Understands Yes ?PreDisposition Call Doctor ?Care Advice Given Per Guideline ?GO TO ED NOW: CARE ADVICE per Wound Infection (Adult) guideline. ?Comments ?User: Kizzie Fantasia, RN Date/Time Eilene Ghazi Time): 08/20/2021 11:52:32 AM ?spoke to [employer] at the office. ?Referrals ?GO TO FACILITY REFUSE ?

## 2021-08-21 NOTE — Telephone Encounter (Signed)
Called and left message on VM to keep appt with wound clinic and if worse over the weekend seek care at ER or UC.  ?

## 2021-08-21 NOTE — Telephone Encounter (Signed)
At this point I think it makes sense to keep the appointment with the wound clinic but to ER vs UC over the weekend if clearly worse. Thanks.  ?

## 2021-08-21 NOTE — Telephone Encounter (Signed)
I spoke with pt to get update and pt said the leg is still draining the same as it has for couple of weeks but the burning sensation in back of leg is better today. The pain is not as bad in leg also so pt appreciated call to get update and sending note to Dr Darnell Level who is out of office and to Dr Damita Dunnings who is in office today. Pt still has wound care NP appt on 08/24/21.  ?

## 2021-08-21 NOTE — Progress Notes (Signed)
KEYONTE, COOKSTON ([id number]) ?Visit Report for 08/24/2021 ?Allergy List Details ?Patient Name: [name], [name]. ?Date of Service: 08/24/2021 10:00 AM ?Medical Record Number: [id number] ?Patient Account Number: [id number] ?Date of Birth/Sex: 07-Apr-1956 (66 y.o. M) ?Treating RN: Donnamarie Poag ?Primary Care Leasha Goldberger: Other Clinician: ?Referring Aveena Bari: ?Treating Laramie Gelles/Extender: Jeri Cos ?Weeks in Treatment: 0 ?Allergies ?Active Allergies ?atorvastatin ?Reaction: dizziness ?Active: 02/06/2013 ?lisinopril ?Active: 02/06/2013 ?Allergy Notes ?Electronic Signature(s) ?Signed: 08/21/2021 12:17:11 PM By: Donavan Burnet CHT, EMT, BS ?Previous Signature: 08/21/2021 12:12:28 PM Version By: Donavan Burnet CHT, EMT, BS ?Entered By: Donavan Burnet on 08/21/2021 12:17:11 ?

## 2021-08-24 ENCOUNTER — Ambulatory Visit: Payer: Medicare Other | Admitting: Physician Assistant

## 2021-08-24 NOTE — Telephone Encounter (Signed)
Spoke to patient by telephone and was advised that he was not able to make his appointment today at the wound clinic. Patient stated the battery on his truck was drained. Patient stated that he has rescheduled the appointment to 09/02/21. Patient stated that his legs are a little better. Patient stated that he has felt bad the past 3 days. Patient stated that he has been real nauseated and constipated. Patient stated that he has had a low grade fever of 98.6 which his normal is 97.5. Patient stated that he has been taking Pepto for his stomach. Patient stated that he has an appointment scheduled with Dr. Danise Mina Wednesday 08/26/21 and plans on being here. ?

## 2021-08-26 ENCOUNTER — Ambulatory Visit
Admission: RE | Admit: 2021-08-26 | Discharge: 2021-08-26 | Disposition: A | Discharge status: Home / Self Care | Payer: Medicare Other | Source: Ambulatory Visit | Attending: Family Medicine | Admitting: Family Medicine

## 2021-08-26 ENCOUNTER — Ambulatory Visit
Admission: RE | Admit: 2021-08-26 | Discharge: 2021-08-26 | Disposition: A | Discharge status: Home / Self Care | Payer: Medicare Other | Attending: Family Medicine | Admitting: Family Medicine

## 2021-08-26 ENCOUNTER — Other Ambulatory Visit: Payer: Self-pay | Admitting: Family Medicine

## 2021-08-26 ENCOUNTER — Ambulatory Visit (INDEPENDENT_AMBULATORY_CARE_PROVIDER_SITE_OTHER): Payer: Medicare Other | Admitting: Family Medicine

## 2021-08-26 ENCOUNTER — Inpatient Hospital Stay: Payer: Medicare Other | Attending: Oncology

## 2021-08-26 ENCOUNTER — Encounter: Payer: Self-pay | Admitting: Family Medicine

## 2021-08-26 VITALS — BP 138/70 | HR 82 | Temp 98.1°F | Ht 69.5 in | Wt 293.4 lb

## 2021-08-26 VITALS — BP 138/78 | HR 71 | Temp 99.1°F

## 2021-08-26 DIAGNOSIS — R188 Other ascites: Secondary | ICD-10-CM | POA: Diagnosis not present

## 2021-08-26 DIAGNOSIS — R21 Rash and other nonspecific skin eruption: Secondary | ICD-10-CM | POA: Diagnosis not present

## 2021-08-26 DIAGNOSIS — R1084 Generalized abdominal pain: Secondary | ICD-10-CM | POA: Insufficient documentation

## 2021-08-26 DIAGNOSIS — K56609 Unspecified intestinal obstruction, unspecified as to partial versus complete obstruction: Secondary | ICD-10-CM

## 2021-08-26 DIAGNOSIS — D508 Other iron deficiency anemias: Secondary | ICD-10-CM | POA: Insufficient documentation

## 2021-08-26 DIAGNOSIS — L97911 Non-pressure chronic ulcer of unspecified part of right lower leg limited to breakdown of skin: Secondary | ICD-10-CM | POA: Diagnosis not present

## 2021-08-26 DIAGNOSIS — Z8719 Personal history of other diseases of the digestive system: Secondary | ICD-10-CM | POA: Diagnosis not present

## 2021-08-26 DIAGNOSIS — J984 Other disorders of lung: Secondary | ICD-10-CM | POA: Diagnosis not present

## 2021-08-26 DIAGNOSIS — R1033 Periumbilical pain: Secondary | ICD-10-CM | POA: Diagnosis not present

## 2021-08-26 DIAGNOSIS — K5900 Constipation, unspecified: Secondary | ICD-10-CM | POA: Diagnosis not present

## 2021-08-26 LAB — CBC WITH DIFFERENTIAL/PLATELET
Basophils Absolute: 0.1 10*3/uL (ref 0.0–0.1)
Basophils Relative: 0.6 % (ref 0.0–3.0)
Eosinophils Absolute: 0.1 10*3/uL (ref 0.0–0.7)
Eosinophils Relative: 1.1 % (ref 0.0–5.0)
HCT: 30.8 % — ABNORMAL LOW (ref 39.0–52.0)
Hemoglobin: 8.7 g/dL — ABNORMAL LOW (ref 13.0–17.0)
Lymphocytes Relative: 15.3 % (ref 12.0–46.0)
Lymphs Abs: 1.8 10*3/uL (ref 0.7–4.0)
MCHC: 28.4 g/dL — ABNORMAL LOW (ref 30.0–36.0)
MCV: 65.1 fl — ABNORMAL LOW (ref 78.0–100.0)
Monocytes Absolute: 1.4 10*3/uL — ABNORMAL HIGH (ref 0.1–1.0)
Monocytes Relative: 11.8 % (ref 3.0–12.0)
Neutro Abs: 8.2 10*3/uL — ABNORMAL HIGH (ref 1.4–7.7)
Neutrophils Relative %: 71.2 % (ref 43.0–77.0)
Platelets: 330 10*3/uL (ref 150.0–400.0)
RBC: 4.73 Mil/uL (ref 4.22–5.81)
RDW: 20.3 % — ABNORMAL HIGH (ref 11.5–15.5)
WBC: 11.5 10*3/uL — ABNORMAL HIGH (ref 4.0–10.5)

## 2021-08-26 LAB — HEPATIC FUNCTION PANEL
ALT: 11 U/L (ref 0–53)
AST: 15 U/L (ref 0–37)
Albumin: 4.2 g/dL (ref 3.5–5.2)
Alkaline Phosphatase: 54 U/L (ref 39–117)
Bilirubin, Direct: 0.1 mg/dL (ref 0.0–0.3)
Total Bilirubin: 0.5 mg/dL (ref 0.2–1.2)
Total Protein: 6.5 g/dL (ref 6.0–8.3)

## 2021-08-26 LAB — BASIC METABOLIC PANEL
BUN: 16 mg/dL (ref 6–23)
CO2: 40 mEq/L — ABNORMAL HIGH (ref 19–32)
Calcium: 9.9 mg/dL (ref 8.4–10.5)
Chloride: 88 mEq/L — ABNORMAL LOW (ref 96–112)
Creatinine, Ser: 0.78 mg/dL (ref 0.40–1.50)
GFR: 93.29 mL/min (ref >60.00)
Glucose, Bld: 216 mg/dL — ABNORMAL HIGH (ref 70–99)
Potassium: 4.2 mEq/L (ref 3.5–5.1)
Sodium: 136 mEq/L (ref 135–145)

## 2021-08-26 MED ORDER — SODIUM CHLORIDE 0.9 % IV SOLN
Freq: Once | INTRAVENOUS | Status: AC
  Filled 2021-08-26: qty 250

## 2021-08-26 MED ORDER — HEPARIN SOD (PORK) LOCK FLUSH 100 UNIT/ML IV SOLN
250.0000 [IU] | Freq: Once | INTRAVENOUS | Status: DC | PRN
  Filled 2021-08-26: qty 5

## 2021-08-26 MED ORDER — ALTEPLASE 2 MG IJ SOLR
2.0000 mg | Freq: Once | INTRAMUSCULAR | Status: DC | PRN
  Filled 2021-08-26: qty 2

## 2021-08-26 MED ORDER — SODIUM CHLORIDE 0.9 % IV SOLN
510.0000 mg | INTRAVENOUS | Status: DC
  Administered 2021-08-26: 510 mg via INTRAVENOUS
  Filled 2021-08-26: qty 510

## 2021-08-26 MED ORDER — HEPARIN SOD (PORK) LOCK FLUSH 100 UNIT/ML IV SOLN
500.0000 [IU] | Freq: Once | INTRAVENOUS | Status: DC | PRN
  Filled 2021-08-26: qty 5

## 2021-08-26 MED ORDER — SODIUM CHLORIDE 0.9% FLUSH
3.0000 mL | Freq: Once | INTRAVENOUS | Status: DC | PRN
  Filled 2021-08-26: qty 3

## 2021-08-26 MED ORDER — SODIUM CHLORIDE 0.9% FLUSH
10.0000 mL | Freq: Once | INTRAVENOUS | Status: DC | PRN
  Filled 2021-08-26: qty 10

## 2021-08-26 NOTE — Patient Instructions (Signed)
[facility]  Discharge Instructions: ?Thank you for choosing [facility] to provide your oncology and hematology care.  ?If you have a lab appointment with the Cancer Center, please go directly to the Cancer Center and check in at the registration area. ? ?Wear comfortable clothing and clothing appropriate for easy access to any Portacath or PICC line.  ? ?We strive to give you quality time with your provider. You may need to reschedule your appointment if you arrive late (15 or more minutes).  Arriving late affects you and other patients whose appointments are after yours.  Also, if you miss three or more appointments without notifying the office, you may be dismissed from the clinic at the provider?s discretion.    ?  ?For prescription refill requests, have your pharmacy contact our office and allow 72 hours for refills to be completed.   ? ?Today you received the following chemotherapy and/or immunotherapy agents VENOFER ?    ?  ?To help prevent nausea and vomiting after your treatment, we encourage you to take your nausea medication as directed. ? ?BELOW ARE SYMPTOMS THAT SHOULD BE REPORTED IMMEDIATELY: ?*FEVER GREATER THAN 100.4 F (38 ?C) OR HIGHER ?*CHILLS OR SWEATING ?*NAUSEA AND VOMITING THAT IS NOT CONTROLLED WITH YOUR NAUSEA MEDICATION ?*UNUSUAL SHORTNESS OF BREATH ?*UNUSUAL BRUISING OR BLEEDING ?*URINARY PROBLEMS (pain or burning when urinating, or frequent urination) ?*BOWEL PROBLEMS (unusual diarrhea, constipation, pain near the anus) ?TENDERNESS IN MOUTH AND THROAT WITH OR WITHOUT PRESENCE OF ULCERS (sore throat, sores in mouth, or a toothache) ?UNUSUAL RASH, SWELLING OR PAIN  ?UNUSUAL VAGINAL DISCHARGE OR ITCHING  ? ?Items with * indicate a potential emergency and should be followed up as soon as possible or go to the Emergency Department if any problems should occur. ? ?Please show the CHEMOTHERAPY ALERT CARD or IMMUNOTHERAPY ALERT CARD at check-in to  the Emergency Department and triage nurse. ? ?Should you have questions after your visit or need to cancel or reschedule your appointment, please contact [facility]  [phone] and follow the prompts.  Office hours are 8:00 a.m. to 4:30 p.m. Monday - Friday. Please note that voicemails left after 4:00 p.m. may not be returned until the following business day.  We are closed weekends and major holidays. You have access to a nurse at all times for urgent questions. Please call the main number to the clinic [phone] and follow the prompts. ? ?For any non-urgent questions, you may also contact your provider using MyChart. We now offer e-Visits for anyone 18 and older to request care online for non-urgent symptoms. For details visit [email or web address]. ?  ?Also download the MyChart app! Go to the app store, search "MyChart", open the app, select [facility], and log in with your MyChart username and password. ? ?Due to Covid, a mask is required upon entering the hospital/clinic. If you do not have a mask, one will be given to you upon arrival. For doctor visits, patients may have 1 support person aged 18 or older with them. For treatment visits, patients cannot have anyone with them due to current Covid guidelines and our immunocompromised population.  ? ?Iron Sucrose Injection ?What is this medication? ?IRON SUCROSE (EYE ern SOO krose) treats low levels of iron (iron deficiency anemia) in people with kidney disease. Iron is a mineral that plays an important role in making red blood cells, which carry oxygen from your lungs to the rest of your body. ?This medicine   may be used for other purposes; ask your health care provider or pharmacist if you have questions. ?COMMON BRAND NAME(S): Venofer ?What should I tell my care team before I take this medication? ?They need to know if you have any of these conditions: ?Anemia not caused by low iron levels ?Heart disease ?High levels of  iron in the blood ?Kidney disease ?Liver disease ?An unusual or allergic reaction to iron, other medications, foods, dyes, or preservatives ?Pregnant or trying to get pregnant ?Breast-feeding ?How should I use this medication? ?This medication is for infusion into a vein. It is given in a hospital or clinic setting. ?Talk to your care team about the use of this medication in children. While this medication may be prescribed for children as young as 2 years for selected conditions, precautions do apply. ?Overdosage: If you think you have taken too much of this medicine contact a poison control center or emergency room at once. ?NOTE: This medicine is only for you. Do not share this medicine with others. ?What if I miss a dose? ?It is important not to miss your dose. Call your care team if you are unable to keep an appointment. ?What may interact with this medication? ?Do not take this medication with any of the following: ?Deferoxamine ?Dimercaprol ?Other iron products ?This medication may also interact with the following: ?Chloramphenicol ?Deferasirox ?This list may not describe all possible interactions. Give your health care provider a list of all the medicines, herbs, non-prescription drugs, or dietary supplements you use. Also tell them if you smoke, drink alcohol, or use illegal drugs. Some items may interact with your medicine. ?What should I watch for while using this medication? ?Visit your care team regularly. Tell your care team if your symptoms do not start to get better or if they get worse. You may need blood work done while you are taking this medication. ?You may need to follow a special diet. Talk to your care team. Foods that contain iron include: whole grains/cereals, dried fruits, beans, or peas, leafy green vegetables, and organ meats (liver, kidney). ?What side effects may I notice from receiving this medication? ?Side effects that you should report to your care team as soon as  possible: ?Allergic reactions--skin rash, itching, hives, swelling of the face, lips, tongue, or throat ?Low blood pressure--dizziness, feeling faint or lightheaded, blurry vision ?Shortness of breath ?Side effects that usually do not require medical attention (report to your care team if they continue or are bothersome): ?Flushing ?Headache ?Joint pain ?Muscle pain ?Nausea ?Pain, redness, or irritation at injection site ?This list may not describe all possible side effects. Call your doctor for medical advice about side effects. You may report side effects to FDA at 1-800-FDA-1088. ?Where should I keep my medication? ?This medication is given in a hospital or clinic and will not be stored at home. ?NOTE: This sheet is a summary. It may not cover all possible information. If you have questions about this medicine, talk to your doctor, pharmacist, or health care provider. ?? 2023 Elsevier/Gold Standard (2020-09-05 00:00:00) ? ?

## 2021-08-26 NOTE — Assessment & Plan Note (Signed)
This seems improved with weight loss, but persists. ?Encouraged he keep wound clinic appt next week.  ?

## 2021-08-26 NOTE — CV Procedure (Signed)
Electrical Cardioversion Procedure Note ?Jakson Delpilar Balderrama ?[id number] ?June 26, 1955 ? ?Procedure: Electrical Cardioversion ?Indications:  Paroxysmal non valvular atrial fibrillation ? ?Procedure Details ?Consent: Risks of procedure as well as the alternatives and risks of each were explained to the (patient/caregiver).  Consent for procedure obtained. ?Time Out: Verified patient identification, verified procedure, site/side was marked, verified correct patient position, special equipment/implants available, medications/allergies/relevent history reviewed, required imaging and test results available.  Performed ? ?Patient placed on cardiac monitor, pulse oximetry, supplemental oxygen as necessary.  ?Sedation given: Propofol and versed as per anesthesia  ?Pacer pads placed anterior and posterior chest. ?  ?Patient found to be in NSR and procedure aborted ? ? ?Serafina Royals M.D. FACC ?08/26/2021, 8:13 AM ? ? ? ?

## 2021-08-26 NOTE — Progress Notes (Addendum)
? ? Patient ID: [name], male    DOB: 1956-02-25, 66 y.o.   MRN: [id number] ? ?This visit was conducted in person. ? ?BP 138/70   Pulse 82   Temp 98.1 ?F (36.7 ?C) (Temporal)   Ht 5' 9.5" (1.765 m)   Wt 293 lb 6 oz (133.1 kg)   SpO2 90%   BMI 42.70 kg/m?   ? ?CC: f/u R leg wound  ?Subjective:  ? ?HPI: ?[name] is a 66 y.o. male presenting on 08/26/2021 for Follow-up (R Leg ulcer ) ? ? ?10 lbs down since last visit.  ? ?Unable to make wound clinic appt earlier this week due to car trouble. Has rescheduled wound clinic appt to 09/02/2021.  ? ?Recent malaise, nausea, constipation managing with pepto bismol. No BM in 4 days - this is despite milk of magnesia. Lower abdominal discomfort, nausea, vomited x1 2 days ago. Only eating tea and drinking crackers. Not passing gas. Actually feeling better than 2 days ago.  ? ?Saw heme Dr Janese Banks for progressive microcytic anemia - pending iron infusion x2 ([facility]), first one scheduled later today. Can't tolerate oral iron.  ?Upcoming capsule endoscopy May 15th for progressive anemia. ?   ? ?Relevant past medical, surgical, family and social history reviewed and updated as indicated. Interim medical history since our last visit reviewed. ?Allergies and medications reviewed and updated. ?Outpatient Medications Prior to Visit  ?Medication Sig Dispense Refill  ? ACCU-CHEK FASTCLIX LANCETS MISC Check blood sugar once daily and as instructed. Dx 250.00 100 each 3  ? albuterol (PROVENTIL) (2.5 MG/3ML) 0.083% nebulizer solution INHALE 3 ML BY NEBULIZATION EVERY 6 HOURS AS NEEDED FOR WHEEZING OR SHORTNESS OF BREATH (Patient taking differently: Take 2.5 mg by nebulization 2 (two) times daily.) 150 mL 0  ? albuterol (VENTOLIN HFA) 108 (90 Base) MCG/ACT inhaler INHALE 2 PUFFS BY MOUTH EVERY 6 HOURS AS NEEDED FOR WHEEZE OR SHORTNESS OF BREATH 8.5 each 0  ? amiodarone (PACERONE) 200 MG tablet Take 200 mg by mouth 2 (two) times daily.    ? Blood Glucose Monitoring Suppl  (BLOOD GLUCOSE MONITOR SYSTEM) W/DEVICE KIT by Does not apply route. Use to check sugar once daily and as needed Dx: E11.9 **ONE TOUCH VERIO**    ? carvedilol (COREG) 6.25 MG tablet TAKE 1 TABLET BY MOUTH 2 TIMES DAILY WITH A MEAL. 180 tablet 0  ? Cholecalciferol (VITAMIN D) 50 MCG (2000 UT) CAPS Take 1 capsule (2,000 Units total) by mouth daily. (Patient taking differently: Take 2,000 Units by mouth daily.) 30 capsule   ? citalopram (CELEXA) 10 MG tablet Take 1 tablet (10 mg total) by mouth daily. 90 tablet 3  ? Docusate Calcium (STOOL SOFTENER PO) Take 1 tablet by mouth daily.    ? ELIQUIS 5 MG TABS tablet Take 5 mg by mouth 2 (two) times daily.    ? ferrous sulfate 325 (65 FE) MG tablet Take 1 tablet (325 mg total) by mouth daily with breakfast.    ? fluticasone (FLONASE) 50 MCG/ACT nasal spray SPRAY 2 SPRAYS INTO EACH NOSTRIL EVERY DAY (Patient taking differently: Place 1 spray into both nostrils daily as needed for allergies.) 48 mL 1  ? fluticasone-salmeterol (WIXELA INHUB) 100-50 MCG/ACT AEPB Inhale 1 puff into the lungs 2 (two) times daily. 60 each 11  ? furosemide (LASIX) 80 MG tablet Take 1 tablet (80 mg total) by mouth 2 (two) times daily. 60 tablet 3  ? hydrOXYzine (ATARAX) 50 MG tablet TAKE 1 TABLET (  50 MG TOTAL) BY MOUTH 2 (TWO) TIMES DAILY AS NEEDED FOR ANXIETY. 180 tablet 1  ? ipratropium (ATROVENT HFA) 17 MCG/ACT inhaler Inhale 2 puffs into the lungs every 6 (six) hours as needed for wheezing. 1 each 12  ? losartan (COZAAR) 25 MG tablet TAKE 1 TABLET (25 MG TOTAL) BY MOUTH DAILY. 90 tablet 0  ? metFORMIN (GLUCOPHAGE) 1000 MG tablet TAKE 1 TABLET (1,000 MG TOTAL) BY MOUTH 2 (TWO) TIMES DAILY WITH A MEAL. 180 tablet 3  ? metolazone (ZAROXOLYN) 2.5 MG tablet Take 1 tablet (2.5 mg total) by mouth 2 (two) times a week. 8 tablet 1  ? montelukast (SINGULAIR) 10 MG tablet TAKE 1 TABLET BY MOUTH EVERYDAY AT BEDTIME 90 tablet 3  ? NON FORMULARY 1,200 mg daily. SEA MOSS    ? NON FORMULARY 2,000 mg daily.  CINAMMON    ? NON FORMULARY daily. SUPER BEETS    ? NON FORMULARY 500 mg once. TURMERIC    ? ONETOUCH VERIO test strip USE AS INSTRUCTED TO CHECK THREE TIMES DAILY AND AS NEEDED. E11.8 250 strip 1  ? pantoprazole (PROTONIX) 40 MG tablet TAKE 1 TABLET (40 MG TOTAL) BY MOUTH 2 (TWO) TIMES DAILY BEFORE A MEAL. 180 tablet 0  ? Probiotic Product (PROBIOTIC DAILY PO) Take 1 tablet by mouth daily.    ? simvastatin (ZOCOR) 20 MG tablet TAKE 1 TABLET BY MOUTH EVERY DAY IN THE EVENING 90 tablet 0  ? triamcinolone cream (KENALOG) 0.1 % Apply 1 application. topically 2 (two) times daily. Apply to AA. (Patient taking differently: Apply 1 application. topically 3 (three) times daily. Apply to AA.) 80 g 0  ? umeclidinium bromide (INCRUSE ELLIPTA) 62.5 MCG/ACT AEPB Inhale 1 puff into the lungs daily. 30 each 11  ? vitamin B-12 (CYANOCOBALAMIN) 1000 MCG tablet Take 1 tablet (1,000 mcg total) by mouth every Monday, Wednesday, and Friday. (Patient taking differently: Take 1,000 mcg by mouth daily.)    ? glimepiride (AMARYL) 4 MG tablet TAKE 1 TABLET BY MOUTH DAILY WITH BREAKFAST 90 tablet 3  ? ?No facility-administered medications prior to visit.  ?  ? ?Per HPI unless specifically indicated in ROS section below ?Review of Systems ? ?Objective:  ?BP 138/70   Pulse 82   Temp 98.1 ?F (36.7 ?C) (Temporal)   Ht 5' 9.5" (1.765 m)   Wt 293 lb 6 oz (133.1 kg)   SpO2 90%   BMI 42.70 kg/m?   ?Wt Readings from Last 3 Encounters:  ?08/26/21 293 lb 6 oz (133.1 kg)  ?08/17/21 (!) 302 lb 2 oz (137 kg)  ?08/14/21 299 lb 9.6 oz (135.9 kg)  ?  ?  ?Physical Exam ?Vitals and nursing note reviewed.  ?Constitutional:   ?   Appearance: Normal appearance. He is not ill-appearing.  ?Cardiovascular:  ?   Rate and Rhythm: Normal rate and regular rhythm.  ?   Pulses: Normal pulses.  ?   Heart sounds: Normal heart sounds. No murmur heard. ?Pulmonary:  ?   Effort: Pulmonary effort is normal. No respiratory distress.  ?   Breath sounds: Normal breath sounds.  No wheezing, rhonchi or rales.  ?Musculoskeletal:  ?   Right lower leg: Edema present.  ?   Left lower leg: Edema present.  ?   Comments: Improving pedal edema  ?Skin: ?   General: Skin is warm and dry.  ?   Findings: Erythema (improving) and rash present.  ? ?    ?   Comments: Macerated skin to  posterior R calf with improved weeping/clear drainage  ?Neurological:  ?   Mental Status: He is alert.  ? ?   ?Results for orders placed or performed in visit on 08/26/21  ?Hepatic Function Panel  ?Result Value Ref Range  ? Total Bilirubin 0.5 0.2 - 1.2 mg/dL  ? Bilirubin, Direct 0.1 0.0 - 0.3 mg/dL  ? Alkaline Phosphatase 54 39 - 117 U/L  ? AST 15 0 - 37 U/L  ? ALT 11 0 - 53 U/L  ? Total Protein 6.5 6.0 - 8.3 g/dL  ? Albumin 4.2 3.5 - 5.2 g/dL  ?Basic metabolic panel  ?Result Value Ref Range  ? Sodium 136 135 - 145 mEq/L  ? Potassium 4.2 3.5 - 5.1 mEq/L  ? Chloride 88 (L) 96 - 112 mEq/L  ? CO2 40 (H) 19 - 32 mEq/L  ? Glucose, Bld 216 (H) 70 - 99 mg/dL  ? BUN 16 6 - 23 mg/dL  ? Creatinine, Ser 0.78 0.40 - 1.50 mg/dL  ? GFR 93.29 >60.00 mL/min  ? Calcium 9.9 8.4 - 10.5 mg/dL  ?CBC with Differential/Platelet  ?Result Value Ref Range  ? WBC 11.5 (H) 4.0 - 10.5 K/uL  ? RBC 4.73 4.22 - 5.81 Mil/uL  ? Hemoglobin 8.7 Repeated and verified X2. (L) 13.0 - 17.0 g/dL  ? HCT 30.8 (L) 39.0 - 52.0 %  ? MCV 65.1 (L) 78.0 - 100.0 fl  ? MCHC 28.4 (L) 30.0 - 36.0 g/dL  ? RDW 20.3 (H) 11.5 - 15.5 %  ? Platelets 330.0 150.0 - 400.0 K/uL  ? Neutrophils Relative % 71.2 43.0 - 77.0 %  ? Lymphocytes Relative 15.3 12.0 - 46.0 %  ? Monocytes Relative 11.8 3.0 - 12.0 %  ? Eosinophils Relative 1.1 0.0 - 5.0 %  ? Basophils Relative 0.6 0.0 - 3.0 %  ? Neutro Abs 8.2 (H) 1.4 - 7.7 K/uL  ? Lymphs Abs 1.8 0.7 - 4.0 K/uL  ? Monocytes Absolute 1.4 (H) 0.1 - 1.0 K/uL  ? Eosinophils Absolute 0.1 0.0 - 0.7 K/uL  ? Basophils Absolute 0.1 0.0 - 0.1 K/uL  ? ?DG ABD ACUTE 2+V W 1V CHEST ?CLINICAL DATA:  Periumbilical abdominal pain, pressure, and ?constipation.  History of a hernia. ? ?EXAM: ?DG ABDOMEN ACUTE WITH 1 VIEW CHEST ? ?COMPARISON:  Chest radiographs 07/20/2021 ? ?FINDINGS: ?The cardiac silhouette remains mildly enlarged. There is mild ?scarring in the right lung base. No a

## 2021-08-26 NOTE — Patient Instructions (Signed)
Xray and labs today ?Continue current medicines.  ?If worsening, go to the ER.  ?

## 2021-08-26 NOTE — Assessment & Plan Note (Addendum)
Improving on triple abx cream and with improving pedal edema.  ?

## 2021-08-26 NOTE — Assessment & Plan Note (Addendum)
Lower abdominal pain and swelling previously attributed to ascites, now concern for developing bowel obstruction. Will send for STAT labs today and STAT acute abdominal series. Pending results will expedite CT abd/pelvis (previously ordered 3/28 but not yet done). Reviewed ER precautions.  ?

## 2021-08-26 NOTE — Assessment & Plan Note (Signed)
Pending first iron infusion later today.  ?Has capsule endoscopy scheduled for May 15th.  ?

## 2021-08-27 NOTE — Addendum Note (Signed)
Addended by: [name] on: 08/27/2021 12:51 PM ? ? Modules accepted: Orders ? ?

## 2021-08-31 ENCOUNTER — Telehealth: Payer: Self-pay | Admitting: Family Medicine

## 2021-08-31 ENCOUNTER — Ambulatory Visit
Admission: RE | Admit: 2021-08-31 | Discharge: 2021-08-31 | Disposition: A | Discharge status: Home / Self Care | Payer: Medicare Other | Source: Ambulatory Visit | Attending: Family Medicine | Admitting: Family Medicine

## 2021-08-31 DIAGNOSIS — R1084 Generalized abdominal pain: Secondary | ICD-10-CM | POA: Diagnosis not present

## 2021-08-31 DIAGNOSIS — I700 Atherosclerosis of aorta: Secondary | ICD-10-CM | POA: Diagnosis not present

## 2021-08-31 DIAGNOSIS — K56609 Unspecified intestinal obstruction, unspecified as to partial versus complete obstruction: Secondary | ICD-10-CM | POA: Insufficient documentation

## 2021-08-31 DIAGNOSIS — R188 Other ascites: Secondary | ICD-10-CM | POA: Diagnosis not present

## 2021-08-31 DIAGNOSIS — R1030 Lower abdominal pain, unspecified: Secondary | ICD-10-CM | POA: Diagnosis not present

## 2021-08-31 MED ORDER — IOHEXOL 300 MG/ML  SOLN
100.0000 mL | Freq: Once | INTRAMUSCULAR | Status: AC | PRN
  Administered 2021-08-31: 100 mL via INTRAVENOUS

## 2021-08-31 NOTE — Telephone Encounter (Signed)
Spoke with pt informing him rx was initially denied on 08/15/21 since a refill was just sent on 07/29/21.  Pt verbalizes understanding but states he needs a refill now since he's using it twice a day due to SOB from moving more but swollen everywhere. ? ?Albuterol neb ?Last rx:  07/29/21, #150 mL ?Last OV:  08/26/21, abd pain; R leg ulcer ?Next OV: 11/02/21,  CPE ? ?

## 2021-08-31 NOTE — Addendum Note (Signed)
Addended by: Brenton Grills on: 08/01/2070 18:28 PM ? ? Modules accepted: Orders ? ?

## 2021-08-31 NOTE — Telephone Encounter (Signed)
Pt called to follow up on this because he was told by his pharmacy it was denied but he wasn't sure why. Please advise, callback is [phone] ?

## 2021-09-01 ENCOUNTER — Telehealth: Payer: Self-pay | Admitting: Family Medicine

## 2021-09-01 MED ORDER — ALBUTEROL SULFATE (2.5 MG/3ML) 0.083% IN NEBU: INHALATION_SOLUTION | RESPIRATORY_TRACT | 0 refills | Status: DC

## 2021-09-01 NOTE — Telephone Encounter (Signed)
Refill sent on 09/01/21. ?

## 2021-09-01 NOTE — Telephone Encounter (Signed)
Pt asked to call him back at [phone] about imaging results ?

## 2021-09-01 NOTE — Telephone Encounter (Signed)
Left message for patient to call back and schedule Medicare Annual Wellness Visit (AWV) to be completed by video or phone. ? ? ? ?Last AWV: 07/09/2020 ?  ? ? ?Please schedule at anytime with  ?[facility] Nurse Health Advisor    ? ? ? ?45 minute appointment ? ? ? ?Any questions, please contact me at [phone] ?

## 2021-09-01 NOTE — Telephone Encounter (Signed)
Pt aware.  (See Imaging, 08/31/21) ?

## 2021-09-02 ENCOUNTER — Encounter: Payer: Medicare Other | Attending: Internal Medicine | Admitting: Internal Medicine

## 2021-09-02 ENCOUNTER — Inpatient Hospital Stay: Payer: Medicare Other

## 2021-09-02 ENCOUNTER — Other Ambulatory Visit: Payer: Self-pay | Admitting: Family Medicine

## 2021-09-02 VITALS — BP 125/77 | HR 70 | Temp 98.0°F

## 2021-09-02 DIAGNOSIS — I890 Lymphedema, not elsewhere classified: Secondary | ICD-10-CM | POA: Insufficient documentation

## 2021-09-02 DIAGNOSIS — E11622 Type 2 diabetes mellitus with other skin ulcer: Secondary | ICD-10-CM | POA: Diagnosis not present

## 2021-09-02 DIAGNOSIS — D508 Other iron deficiency anemias: Secondary | ICD-10-CM

## 2021-09-02 DIAGNOSIS — I4891 Unspecified atrial fibrillation: Secondary | ICD-10-CM | POA: Diagnosis not present

## 2021-09-02 DIAGNOSIS — Z7901 Long term (current) use of anticoagulants: Secondary | ICD-10-CM | POA: Diagnosis not present

## 2021-09-02 DIAGNOSIS — G4733 Obstructive sleep apnea (adult) (pediatric): Secondary | ICD-10-CM | POA: Diagnosis not present

## 2021-09-02 DIAGNOSIS — L97812 Non-pressure chronic ulcer of other part of right lower leg with fat layer exposed: Secondary | ICD-10-CM | POA: Insufficient documentation

## 2021-09-02 DIAGNOSIS — J449 Chronic obstructive pulmonary disease, unspecified: Secondary | ICD-10-CM | POA: Insufficient documentation

## 2021-09-02 DIAGNOSIS — Z87891 Personal history of nicotine dependence: Secondary | ICD-10-CM | POA: Diagnosis not present

## 2021-09-02 DIAGNOSIS — I87321 Chronic venous hypertension (idiopathic) with inflammation of right lower extremity: Secondary | ICD-10-CM | POA: Insufficient documentation

## 2021-09-02 MED ORDER — SODIUM CHLORIDE 0.9 % IV SOLN
Freq: Once | INTRAVENOUS | Status: AC
  Filled 2021-09-02: qty 250

## 2021-09-02 MED ORDER — SODIUM CHLORIDE 0.9 % IV SOLN
510.0000 mg | INTRAVENOUS | Status: DC
  Administered 2021-09-02: 510 mg via INTRAVENOUS
  Filled 2021-09-02: qty 510

## 2021-09-02 NOTE — Telephone Encounter (Signed)
Strength increased to 80 mg.  New rx sent on 08/10/21, #60/3. ?

## 2021-09-02 NOTE — Progress Notes (Signed)
[name], [name] ([id number]) ?Visit Report for 09/02/2021 ?Chief Complaint Document Details ?Patient Name: [name]. ?Date of Service: 09/02/2021 8:45 AM ?Medical Record Number: [id number] ?Patient Account Number: [id number] ?Date of Birth/Sex: December 18, 1955 (66 y.o. Male) ?Treating RN: Carlene Coria ?Primary Care Provider: [name] Other Clinician: ?Referring Provider: [name] ?Treating Provider/Extender: Kalman Shan ?Weeks in Treatment: 0 ?Information Obtained from: Patient ?Chief Complaint ?09/02/2021; right lower extremity wound ?Electronic Signature(s) ?Signed: 09/02/2021 10:34:23 AM By: Kalman Shan DO ?Entered By: Kalman Shan on 09/02/2021 10:20:02 ?[name], [name] ([id number]) ?-------------------------------------------------------------------------------- ?Debridement Details ?Patient Name: [name]. ?Date of Service: 09/02/2021 8:45 AM ?Medical Record Number: [id number] ?Patient Account Number: [id number] ?Date of Birth/Sex: [birth year] (66 y.o. Male) ?Treating RN: Carlene Coria ?Primary Care Provider: [name] Other Clinician: ?Referring Provider: [name] ?Treating Provider/Extender: Kalman Shan ?Weeks in Treatment: 0 ?Debridement Performed for ?Wound #1 Right,Circumferential Lower Leg ?Assessment: ?Performed By: Physician Kalman Shan, MD ?Debridement Type: Debridement ?Level of Consciousness (Pre- ?Awake and Alert ?procedure): ?Pre-procedure Verification/Time Out ?Yes - 09:44 ?Taken: ?Start Time: 09:44 ?Total Area Debrided (L x W): 3 (cm) x 1 (cm) = 3 (cm?) ?Tissue and other material ?Viable, Non-Viable, [place], Subcutaneous, [place] ?debrided: ?Level: Skin/Subcutaneous Tissue ?Debridement Description: Excisional ?Instrument: Curette ?Bleeding: Minimum ?Hemostasis Achieved: Pressure ?End Time: 09:47 ?Procedural Pain: 0 ?Post Procedural Pain: 0 ?Response to Treatment: Procedure was tolerated well ?Level of Consciousness (Post- ?Awake  and Alert ?procedure): ?Post Debridement Measurements of Total Wound ?Length: (cm) 14 ?Width: (cm) 36 ?Depth: (cm) 0.1 ?Volume: (cm?) 39.584 ?Character of Wound/Ulcer Post Debridement: Improved ?Post Procedure Diagnosis ?Same as Pre-procedure ?Electronic Signature(s) ?Signed: 09/02/2021 10:34:23 AM By: Kalman Shan DO ?Signed: 09/02/2021 3:40:11 PM By: Carlene Coria RN ?Entered By: Carlene Coria on 09/02/2021 09:47:33 ?[name], [name] ([id number]) ?-------------------------------------------------------------------------------- ?HPI Details ?Patient Name: [name]. ?Date of Service: 09/02/2021 8:45 AM ?Medical Record Number: [id number] ?Patient Account Number: [id number] ?Date of Birth/Sex: December 15, 1955 (66 y.o. Male) ?Treating RN: Carlene Coria ?Primary Care Provider: [name] Other Clinician: ?Referring Provider: [name] ?Treating Provider/Extender: Kalman Shan ?Weeks in Treatment: 0 ?History of Present Illness ?HPI Description: Admission 09/02/2021 ?[name] is a 66 year old male with a past medical history of A-fib, COPD, and OSA that presents the clinic for a 1 month history of ?nonhealing wounds to the right lower extremity. He states he had increased swelling to his lower extremities despite being on Lasix 40 mg twice ?daily About 4 weeks ago. He developed blisters and these opened into wounds To both legs. He states that his Lasix was increased to 80 mg twice ?daily and this helped significantly with his swelling and wound healing. He currently only has wounds to the right lower extremity that are ?weeping. He has been keeping the areas covered. He currently denies systemic signs of infection. He denies shortness of breath, orthopnea, ?fever/chills, nausea/vomiting ?Electronic Signature(s) ?Signed: 09/02/2021 10:34:23 AM By: Kalman Shan DO ?Entered By: Kalman Shan on 09/02/2021 10:33:26 ?[name], [name]  ([id number]) ?-------------------------------------------------------------------------------- ?Physical Exam Details ?Patient Name: [name], [name]. ?Date of Service: 09/02/2021 8:45 AM ?Medical Record Number: [id number] ?Patient Account Number: [id number] ?Date of Birth/Sex: [birth year] (66 y.o. Male) ?Treating RN: Carlene Coria ?Primary Care Provider: [name] Other Clinician: ?Referring Provider: [name] ?Treating Provider/Extender: Kalman Shan ?Weeks in Treatment: 0 ?Constitutional ?. ?Cardiovascular ?Marland Kitchen ?Psychiatric ?Marland Kitchen ?Notes ?Right lower extremity: To the anterior aspect there is a wound with nonviable tissue throughout the surface. He also has a large area of skin ?breakdown to the posterior aspect with  weeping and nonviable tissue. No evidence of soft tissue infection. 3+ pitting edema to the mid thigh. ?Evidence of lymphedema skin changes on exam. ?Electronic Signature(s) ?Signed: 09/02/2021 10:34:23 AM By: Kalman Shan DO ?Entered By: Kalman Shan on 09/02/2021 10:27:01 ?[name], [name] ([id number]) ?-------------------------------------------------------------------------------- ?Physician Orders Details ?Patient Name: [name], [name]. ?Date of Service: 09/02/2021 8:45 AM ?Medical Record Number: [id number] ?Patient Account Number: [id number] ?Date of Birth/Sex: March 24, 1956 (66 y.o. 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 ?Bathing/ Shower/ Hygiene ?o May shower with wound dressing protected with water repellent cover or cast protector. ?Edema Control - Lymphedema / Segmental Compressive Device / Other ?o Elevate, Exercise Daily and Avoid Standing for Long Periods of Time. ?o Elevate legs to the level of the heart and pump ankles as often as possible ?o Elevate leg(s) parallel to the floor when sitting. ?Wound Treatment ?Wound #1 - Lower Leg Wound Laterality: Right, Circumferential ?Cleanser: Soap and Water 1 x Per Week/30 Days ?Discharge Instructions: Gently cleanse wound with antibacterial soap, rinse and pat dry prior to dressing wounds ?Topical: Gentamicin 1 x Per Week/30 Days ?Discharge Instructions: Apply as directed by provider. ?Topical: Mupirocin Ointment 1 x Per Week/30 Days ?Discharge Instructions: Apply as directed by provider. ?Primary Dressing: Silvercel 4 1/4x 4 1/4 (in/in) 1 x Per Week/30 Days ?Discharge Instructions: Apply Silvercel 4 1/4x 4 1/4 (in/in) as instructed ?Secondary Dressing: ABD Pad 5x9 (in/in) 1 x Per Week/30 Days ?Discharge Instructions: Cover with ABD pad ?Secondary Dressing: Kerlix 4.5 x 4.1 (in/yd) 1 x Per Week/30 Days ?Discharge Instructions: Apply Kerlix 4.5 x 4.1 (in/yd) as instructed ?Secured With: Coban Cohesive Bandage 4x5 (yds) Stretched 1 x Per Week/30 Days ?Discharge Instructions: Apply Coban as directed. ?Electronic Signature(s) ?Signed: 09/02/2021 10:34:23 AM By: Kalman Shan DO ?Entered By: Kalman Shan on 09/02/2021 10:33:55 ?[name], [name] ([id number]) ?-------------------------------------------------------------------------------- ?Problem List Details ?Patient Name: [name], [name]. ?Date of Service: 09/02/2021 8:45 AM ?Medical Record Number: [id number] ?Patient Account Number: [id number] ?Date of  Birth/Sex: [birth year] (66 y.o. Male) ?Treating RN: Carlene Coria ?Primary Care Provider: [name] Other Clinician: ?Referring Provider: [name] ?Treating Provider/Extender: Kalman Shan ?Weeks in Treatment: 0 ?Active Problems ?ICD-10 ?Encoun

## 2021-09-02 NOTE — Patient Instructions (Signed)

## 2021-09-02 NOTE — Progress Notes (Signed)
[name], [name] ([id number]) ?Visit Report for 09/02/2021 ?Abuse Risk Screen Details ?Patient Name: [name], [name]. ?Date of Service: 09/02/2021 8:45 AM ?Medical Record Number: [id number] ?Patient Account Number: [id number] ?Date of Birth/Sex: 1955/11/17 (66 y.o. Male) ?Treating RN: Carlene Coria ?Primary Care Renee Beale: [name] Other Clinician: ?Referring Lai Hendriks: [name] ?Treating Teleshia Lemere/Extender: Kalman Shan ?Weeks in Treatment: 0 ?Abuse Risk Screen Items ?Answer ?ABUSE RISK SCREEN: ?Has anyone close to you tried to hurt or harm you recentlyo No ?Do you feel uncomfortable with anyone in your familyo No ?Has anyone forced you do things that you didnot want to doo No ?Electronic Signature(s) ?Signed: 09/02/2021 3:40:11 PM By: Carlene Coria RN ?Entered By: Carlene Coria on 09/02/2021 09:12:52 ?[name], [name] ([id number]) ?-------------------------------------------------------------------------------- ?Activities of Daily Living Details ?Patient Name: [name], [name]. ?Date of Service: 09/02/2021 8:45 AM ?Medical Record Number: [id number] ?Patient Account Number: [id number] ?Date of Birth/Sex: October 06, 1955 (66 y.o. Male) ?Treating RN: Carlene Coria ?Primary Care Rhemi Balbach: [name] Other Clinician: ?Referring Youssef Footman: [name] ?Treating Kyce Ging/Extender: Kalman Shan ?Weeks in Treatment: 0 ?Activities of Daily Living Items ?Answer ?Activities of Daily Living (Please select one for each item) ?[place] ?Take Medications Completely Able ?Use Telephone Completely Able ?Care for Appearance Completely Able ?Use Toilet Completely Able ?Bath / Shower Completely Able ?Dress Self Completely Able ?Feed Self Completely Able ?Walk Completely Able ?Get In / Out Bed Completely Able ?Housework Completely Able ?Prepare Meals Completely Able ?Handle Money Completely Able ?Shop for Self Completely Able ?Electronic Signature(s) ?Signed: 09/02/2021 3:40:11 PM By:  Carlene Coria RN ?Entered By: Carlene Coria on 09/02/2021 09:13:20 ?[name], [name] ([id number]) ?-------------------------------------------------------------------------------- ?Education Screening Details ?Patient Name: [name], [name]. ?Date of Service: 09/02/2021 8:45 AM ?Medical Record Number: [id number] ?Patient Account Number: [id number] ?Date of Birth/Sex: Sep 24, 1955 (66 y.o. Male) ?Treating RN: Carlene Coria ?Primary Care Alitza Cowman: [name] Other Clinician: ?Referring Antoria Lanza: [name] ?Treating Raynette Arras/Extender: Kalman Shan ?Weeks in Treatment: 0 ?Primary Learner Assessed: Patient ?Learning Preferences/Education Level/Primary Language ?Learning Preference: Explanation ?Highest Education Level: High School ?Preferred Language: English ?Cognitive Barrier ?Language Barrier: No ?Translator Needed: No ?Memory Deficit: No ?Emotional Barrier: No ?Cultural/Religious Beliefs Affecting Medical Care: No ?Physical Barrier ?Impaired Vision: No ?Impaired Hearing: No ?Decreased Hand dexterity: No ?Knowledge/Comprehension ?Knowledge Level: Medium ?Comprehension Level: High ?Ability to understand written instructions: High ?Ability to understand verbal instructions: High ?Motivation ?Anxiety Level: Anxious ?Cooperation: Cooperative ?Education Importance: Acknowledges Need ?Interest in Health Problems: Asks Questions ?Perception: Coherent ?Willingness to Engage in Self-Management ?High ?Activities: ?Readiness to Engage in Self-Management ?High ?Activities: ?Electronic Signature(s) ?Signed: 09/02/2021 3:40:11 PM By: Carlene Coria RN ?Entered By: Carlene Coria on 09/02/2021 09:14:06 ?[name], [name] ([id number]) ?-------------------------------------------------------------------------------- ?Fall Risk Assessment Details ?Patient Name: [name], [name]. ?Date of Service: 09/02/2021 8:45 AM ?Medical Record Number: [id number] ?Patient Account Number: [id number] ?Date of Birth/Sex: 05/23/1955 (66 y.o.  Male) ?Treating RN: Carlene Coria ?Primary Care Yanique Mulvihill: [name] Other Clinician: ?Referring Praveen Coia: [name] ?Treating Prentice Sackrider/Extender: Kalman Shan ?Weeks in Treatment: 0 ?Fall Risk Assessment Items ?Have you had 2 or more falls in the last 12 monthso 0 Yes ?Have you had any fall that resulted in injury in the last 12 monthso 0 Yes ?FALLS RISK SCREEN ?History of falling - immediate or within 3 months 25 Yes ?Secondary diagnosis (Do you have 2 or more medical diagnoseso) 15 Yes ?Ambulatory aid ?None/bed rest/wheelchair/nurse 0 No ?Crutches/cane/walker 0 No ?Furniture 0 No ?Intravenous therapy Access/Saline/Heparin Lock 0 No ?Gait/Transferring ?Normal/ bed rest/ wheelchair 0 No ?Weak (short steps with or  without shuffle, stooped but able to lift head while walking, may ?0 No ?seek support from furniture) ?Impaired (short steps with shuffle, may have difficulty arising from chair, head down, impaired ?0 No ?balance) ?Mental Status ?Oriented to own ability 0 No ?Electronic Signature(s) ?Signed: 09/02/2021 3:40:11 PM By: Carlene Coria RN ?Entered By: Carlene Coria on 09/02/2021 09:14:25 ?[name], [name] ([id number]) ?-------------------------------------------------------------------------------- ?Foot Assessment Details ?Patient Name: [name], [name]. ?Date of Service: 09/02/2021 8:45 AM ?Medical Record Number: [id number] ?Patient Account Number: [id number] ?Date of Birth/Sex: 19-Jul-1955 (66 y.o. Male) ?Treating RN: Carlene Coria ?Primary Care Staphanie Harbison: [name] Other Clinician: ?Referring Merlean Pizzini: [name] ?Treating Eros Montour/Extender: Kalman Shan ?Weeks in Treatment: 0 ?Foot Assessment Items ?Site Locations ?+ = Sensation present, - = Sensation absent, C = Callus, U = Ulcer ?R = Redness, W = Warmth, M = Maceration, PU = Pre-ulcerative lesion ?F = Fissure, S = Swelling, D = Dryness ?Assessment ?Right: Left: ?Other Deformity: No No ?Prior Foot Ulcer: No No ?Prior  Amputation: No No ?Charcot Joint: No No ?Ambulatory Status: Ambulatory Without Help ?Gait: Steady ?Electronic Signature(s) ?Signed: 09/02/2021 3:40:11 PM By: Carlene Coria RN ?Entered By: Carlene Coria on 09/02/2021 09:18:51 ?[name], [name] ([id number]) ?-------------------------------------------------------------------------------- ?Nutrition Risk Screening Details ?Patient Name: [name], [name]. ?Date of Service: 09/02/2021 8:45 AM ?Medical Record Number: [id number] ?Patient Account Number: [id number] ?Date of Birth/Sex: Mar 11, 1956 (66 y.o. Male) ?Treating RN: Carlene Coria ?Primary Care [name] [name]: [name] Other Clinician: ?Referring Verdun Rackley: [name] ?Treating Belenda Alviar/Extender: Kalman Shan ?Weeks in Treatment: 0 ?Height (in): 71 ?Weight (lbs): 295 ?Body Mass Index (BMI): 41.1 ?Nutrition Risk Screening Items ?Score Screening ?NUTRITION RISK SCREEN: ?I have an illness or condition that made me change the kind and/or amount of food I eat 0 No ?I eat fewer than two meals per day 0 No ?I eat few fruits and vegetables, or milk products 0 No ?I have three or more drinks of beer, liquor or wine almost every day 0 No ?I have tooth or mouth problems that make it hard for me to eat 0 No ?I don't always have enough money to buy the food I need 0 No ?I eat alone most of the time 0 No ?I take three or more different prescribed or over-the-counter drugs a day 1 Yes ?Without wanting to, I have lost or gained 10 pounds in the last six months 0 No ?I am not always physically able to shop, cook and/or feed myself 0 No ?Nutrition Protocols ?Good Risk Protocol 0 No interventions needed ?Moderate Risk Protocol ?High Risk Proctocol ?Risk Level: Good Risk ?Score: 1 ?Electronic Signature(s) ?Signed: 09/02/2021 3:40:11 PM By: Carlene Coria RN ?Entered By: Carlene Coria on 09/02/2021 09:15:13 ?

## 2021-09-02 NOTE — Progress Notes (Signed)
[name], [name] ([id number]) ?Visit Report for 09/02/2021 ?Allergy List Details ?Patient Name: [name], [name]. ?Date of Service: 09/02/2021 8:45 AM ?Medical Record Number: [id number] ?Patient Account Number: [id number] ?Date of Birth/Sex: 1955/10/23 (66 y.o. Male) ?Treating RN: Carlene Coria ?Primary Care Kamilya Wakeman: [name] Other Clinician: ?Referring Jeannifer Drakeford: [name] ?Treating Shoua Ulloa/Extender: Kalman Shan ?Weeks in Treatment: 0 ?Allergies ?Active Allergies ?No Known Allergies ?Allergy Notes ?Electronic Signature(s) ?Signed: 09/02/2021 3:40:11 PM By: Carlene Coria RN ?Entered By: Carlene Coria on 09/02/2021 09:06:30 ?[name], [name] ([id number]) ?-------------------------------------------------------------------------------- ?Arrival Information Details ?Patient Name: [name], [name]. ?Date of Service: 09/02/2021 8:45 AM ?Medical Record Number: [id number] ?Patient Account Number: [id number] ?Date of Birth/Sex: 12/19/1955 (66 y.o. Male) ?Treating RN: Carlene Coria ?Primary Care Nathaniel Yaden: [name] Other Clinician: ?Referring Joriel Streety: [name] ?Treating Koichi Platte/Extender: Kalman Shan ?Weeks in Treatment: 0 ?Visit Information ?Patient Arrived: Kasandra Knudsen ?Arrival Time: 08:58 ?Accompanied By: self ?Transfer Assistance: None ?Patient Identification Verified: Yes ?Secondary Verification Process Completed: Yes ?Patient Requires Transmission-Based Precautions: No ?Patient Has Alerts: Yes ?Patient Alerts: DIABETIC ?Electronic Signature(s) ?Signed: 09/02/2021 3:40:11 PM By: Carlene Coria RN ?Entered By: Carlene Coria on 09/02/2021 09:03:12 ?[name], [name] ([id number]) ?-------------------------------------------------------------------------------- ?Clinic Level of Care Assessment Details ?Patient Name: [name], [name]. ?Date of Service: 09/02/2021 8:45 AM ?Medical Record Number: [id number] ?Patient Account Number: [id number] ?Date of Birth/Sex: Mar 07, 1956 (66 y.o. Male) ?Treating  RN: Carlene Coria ?Primary Care Arleta Ostrum: [name] Other Clinician: ?Referring Treshaun Carrico: [name] ?Treating Hayes Rehfeldt/Extender: Kalman Shan ?Weeks in Treatment: 0 ?Clinic Level of Care Assessment Items ?TOOL 1 Quantity Score ?X - Use when EandM and Procedure is performed on INITIAL visit 1 0 ?ASSESSMENTS - Nursing Assessment / Reassessment ?X - General Physical Exam (combine w/ comprehensive assessment (listed just below) when performed on new ?1 20 ?pt. evals) ?X- 1 25 ?Comprehensive Assessment (HX, ROS, Risk Assessments, Wounds Hx, etc.) ?ASSESSMENTS - Wound and Skin Assessment / Reassessment ?'[]'$  - Dermatologic / Skin Assessment (not related to wound area) 0 ?ASSESSMENTS - Ostomy and/or Continence Assessment and Care ?'[]'$  - Incontinence Assessment and Management 0 ?'[]'$  - 0 ?Ostomy Care Assessment and Management (repouching, etc.) ?PROCESS - Coordination of Care ?X - Simple Patient / Family Education for ongoing care 1 15 ?'[]'$  - 0 ?Complex (extensive) Patient / Family Education for ongoing care ?X- 1 10 ?Staff obtains Consents, Records, Test Results / Process Orders ?'[]'$  - 0 ?Staff telephones HHA, Nursing Homes / Clarify orders / etc ?'[]'$  - 0 ?Routine Transfer to another Facility (non-emergent condition) ?'[]'$  - 0 ?Routine Hospital Admission (non-emergent condition) ?X- 1 15 ?New Admissions / Biomedical engineer / Ordering NPWT, Apligraf, etc. ?'[]'$  - 0 ?Emergency Hospital Admission (emergent condition) ?PROCESS - Special Needs ?'[]'$  - Pediatric / Minor Patient Management 0 ?'[]'$  - 0 ?Isolation Patient Management ?'[]'$  - 0 ?Hearing / Language / Visual special needs ?'[]'$  - 0 ?Assessment of Community assistance (transportation, D/C planning, etc.) ?'[]'$  - 0 ?Additional assistance / Altered mentation ?'[]'$  - 0 ?Support Surface(s) Assessment (bed, cushion, seat, etc.) ?INTERVENTIONS - Miscellaneous ?'[]'$  - External ear exam 0 ?'[]'$  - 0 ?Patient Transfer (multiple staff / Civil Service fast streamer / Similar devices) ?'[]'$  - 0 ?Simple  Staple / Suture removal (25 or less) ?'[]'$  - 0 ?Complex Staple / Suture removal (26 or more) ?'[]'$  - 0 ?Hypo/Hyperglycemic Management (do not check if billed separately) ?X- 1 15 ?Ankle / Brachial Index (ABI) - do not check if billed separately ?Has the patient been seen at the hospital within the last three years: Yes ?Total Score: 100 ?  Level Of Care: New/Established - Level ?3 ?[name], [name] ([id number]) ?Electronic Signature(s) ?Signed: 09/02/2021 3:40:11 PM By: Carlene Coria RN ?Entered ByCarlene Coria on 09/02/2021 09:49:42 ?[name], [name] ([id number]) ?-------------------------------------------------------------------------------- ?Encounter Discharge Information Details ?Patient Name: [name], [name]. ?Date of Service: 09/02/2021 8:45 AM ?Medical Record Number: [id number] ?Patient Account Number: [id number] ?Date of Birth/Sex: 05-Jun-1955 (66 y.o. Male) ?Treating RN: Carlene Coria ?Primary Care Cordel Drewes: [name] Other Clinician: ?Referring Lorance Pickeral: [name] ?Treating Raywood Wailes/Extender: Kalman Shan ?Weeks in Treatment: 0 ?Encounter Discharge Information Items Post Procedure Vitals ?Discharge Condition: Stable ?Temperature (?F): 98.6 ?Ambulatory Status: Kasandra Knudsen ?Pulse (bpm): 72 ?Discharge Destination: Home ?Respiratory Rate (breaths/min): 18 ?Transportation: Private Auto ?Blood Pressure (mmHg): 123/72 ?Accompanied By: self ?Schedule Follow-up Appointment: Yes ?Clinical Summary of Care: Patient Declined ?Electronic Signature(s) ?Signed: 09/02/2021 3:40:11 PM By: Carlene Coria RN ?Entered By: Carlene Coria on 09/02/2021 09:51:16 ?[name], [name] ([id number]) ?-------------------------------------------------------------------------------- ?Lower Extremity Assessment Details ?Patient Name: [name], [name]. ?Date of Service: 09/02/2021 8:45 AM ?Medical Record Number: [id number] ?Patient Account Number: [id number] ?Date of Birth/Sex: 18-Jul-1955 (66 y.o. Male) ?Treating RN: Carlene Coria ?Primary Care Elimelech Houseman: [name] Other Clinician: ?Referring Emerald Gehres: [name] ?Treating Raenah Murley/Extender: Kalman Shan ?Weeks in Treatment: 0 ?Edema Assessment ?Assessed: [Left: No] [Right: No] ?Edema: [Left: Ye] [Right: s] ?Calf ?Left: Right: ?Point of Measurement: 36 cm From Medial Instep 46.3 cm ?Ankle ?Left: Right: ?Point of Measurement: 12 cm From Medial Instep 28 cm ?Knee To Floor ?Left: Right: ?From Medial Instep 42 cm ?Vascular Assessment ?Pulses: ?Dorsalis Pedis ?Palpable: [Right:Yes] ?Doppler Audible: [Right:Yes] ?Blood Pressure: ?Brachial: [Right:123] ?Ankle: ?[Right:Dorsalis Pedis: 160 1.30] ?Electronic Signature(s) ?Signed: 09/02/2021 3:40:11 PM By: Carlene Coria RN ?Entered By: Carlene Coria on 09/02/2021 09:29:06 ?[name], [name] ([id number]) ?-------------------------------------------------------------------------------- ?Multi Wound Chart Details ?Patient Name: [name], [name]. ?Date of Service: 09/02/2021 8:45 AM ?Medical Record Number: [id number] ?Patient Account Number: [id number] ?Date of Birth/Sex: February 29, 1956 (66 y.o. Male) ?Treating RN: Carlene Coria ?Primary Care Quientin Jent: [name] Other Clinician: ?Referring Nestor Wieneke: [name] ?Treating Leighann Amadon/Extender: Kalman Shan ?Weeks in Treatment: 0 ?Vital Signs ?Height(in): 71 ?Pulse(bpm): 72 ?Weight(lbs): 295 ?Blood Pressure(mmHg): 123/72 ?Body Mass Index(BMI): 41.1 ?Temperature(??F): 98.6 ?Respiratory Rate(breaths/min): 18 ?Photos: [N/A:N/A] ?Wound Location: Right, Circumferential Lower Leg N/A N/A ?Wounding Event: Gradually Appeared N/A N/A ?Primary Etiology: Lymphedema N/A N/A ?Comorbid History: Chronic Obstructive Pulmonary N/A N/A ?Disease (COPD), Arrhythmia, ?Congestive Heart Failure, Type II ?Diabetes ?Date Acquired: 08/03/2021 N/A N/A ?Weeks of Treatment: 0 N/A N/A ?Wound Status: Open N/A N/A ?Wound Recurrence: No N/A N/A ?Measurements L x W x D (cm) 14x36x0.1 N/A N/A ?Area (cm?) : 395.841  N/A N/A ?Volume (cm?) : 39.584 N/A N/A ?Classification: Full Thickness Without Exposed N/A N/A ?Support Structures ?Exudate Amount: Medium N/A N/A ?Exudate Type: Serosanguineous N/A N/A ?Exudate Color: red, brown N

## 2021-09-04 ENCOUNTER — Ambulatory Visit
Admission: RE | Admit: 2021-09-04 | Discharge: 2021-09-04 | Disposition: A | Discharge status: Home / Self Care | Payer: Medicare Other | Source: Ambulatory Visit | Attending: Acute Care | Admitting: Acute Care

## 2021-09-04 ENCOUNTER — Other Ambulatory Visit: Payer: Self-pay

## 2021-09-04 DIAGNOSIS — Z87891 Personal history of nicotine dependence: Secondary | ICD-10-CM | POA: Insufficient documentation

## 2021-09-04 DIAGNOSIS — Z122 Encounter for screening for malignant neoplasm of respiratory organs: Secondary | ICD-10-CM | POA: Insufficient documentation

## 2021-09-04 MED ORDER — ALBUTEROL SULFATE (2.5 MG/3ML) 0.083% IN NEBU: INHALATION_SOLUTION | RESPIRATORY_TRACT | 0 refills | Status: DC

## 2021-09-04 NOTE — Telephone Encounter (Signed)
[name] called, he went to [facility] and they do not have his  ? ?albuterol (PROVENTIL) (2.5 MG/3ML) 0.083% nebulizer solution ? ?[facility] ? ?Patient says they keep refilling the inhaler only, he has four of them now ? ?Please follow-up with the pharmacy and the patient ?

## 2021-09-04 NOTE — Addendum Note (Signed)
Addended by: Brenton Grills on: 0/67/7034 03:52 PM ? ? Modules accepted: Orders ? ?

## 2021-09-04 NOTE — Telephone Encounter (Signed)
Spoke with [facility] asking about albuterol neb rx.  States it's on backorder for awhile so they don't have any.  However, they were able to locate some at [facility]  Rockey Situ to send rx to this location and [facility] will c/x the rx at their location.  ? ?E-scribed rx to [facility] Left message on vm per dpr notifying pt.   ?

## 2021-09-07 ENCOUNTER — Encounter: Payer: Self-pay | Admitting: Registered Nurse

## 2021-09-07 ENCOUNTER — Encounter: Admission: RE | Payer: Self-pay | Source: Home / Self Care

## 2021-09-07 ENCOUNTER — Telehealth: Payer: Self-pay

## 2021-09-07 ENCOUNTER — Ambulatory Visit: Admission: RE | Admit: 2021-09-07 | Payer: Medicare Other | Source: Home / Self Care | Admitting: Gastroenterology

## 2021-09-07 SURGERY — IMAGING PROCEDURE, GI TRACT, INTRALUMINAL, VIA CAPSULE

## 2021-09-07 MED ORDER — PROPOFOL 500 MG/50ML IV EMUL
INTRAVENOUS | Status: AC
  Filled 2021-09-07: qty 50

## 2021-09-07 MED ORDER — LIDOCAINE HCL (PF) 2 % IJ SOLN
INTRAMUSCULAR | Status: AC
  Filled 2021-09-07: qty 5

## 2021-09-07 NOTE — Telephone Encounter (Signed)
Trish called from endo and patient did not show up for his capsule study today. They did talk to him on Friday to let him know what time to be there. Can you please reschedule  ?

## 2021-09-08 ENCOUNTER — Encounter: Payer: Self-pay | Admitting: Family Medicine

## 2021-09-08 ENCOUNTER — Ambulatory Visit (INDEPENDENT_AMBULATORY_CARE_PROVIDER_SITE_OTHER): Payer: Medicare Other | Admitting: Family Medicine

## 2021-09-08 ENCOUNTER — Other Ambulatory Visit: Payer: Self-pay | Admitting: Acute Care

## 2021-09-08 VITALS — BP 134/70 | HR 75 | Temp 97.8°F | Ht 71.0 in | Wt 312.4 lb

## 2021-09-08 DIAGNOSIS — F410 Panic disorder [episodic paroxysmal anxiety] without agoraphobia: Secondary | ICD-10-CM

## 2021-09-08 DIAGNOSIS — Z87891 Personal history of nicotine dependence: Secondary | ICD-10-CM

## 2021-09-08 DIAGNOSIS — I5022 Chronic systolic (congestive) heart failure: Secondary | ICD-10-CM

## 2021-09-08 DIAGNOSIS — D508 Other iron deficiency anemias: Secondary | ICD-10-CM | POA: Diagnosis not present

## 2021-09-08 DIAGNOSIS — I7143 Infrarenal abdominal aortic aneurysm, without rupture: Secondary | ICD-10-CM

## 2021-09-08 DIAGNOSIS — G4733 Obstructive sleep apnea (adult) (pediatric): Secondary | ICD-10-CM

## 2021-09-08 DIAGNOSIS — R1084 Generalized abdominal pain: Secondary | ICD-10-CM

## 2021-09-08 DIAGNOSIS — L97911 Non-pressure chronic ulcer of unspecified part of right lower leg limited to breakdown of skin: Secondary | ICD-10-CM | POA: Diagnosis not present

## 2021-09-08 DIAGNOSIS — Z122 Encounter for screening for malignant neoplasm of respiratory organs: Secondary | ICD-10-CM

## 2021-09-08 DIAGNOSIS — R0609 Other forms of dyspnea: Secondary | ICD-10-CM

## 2021-09-08 DIAGNOSIS — R140 Abdominal distension (gaseous): Secondary | ICD-10-CM | POA: Diagnosis not present

## 2021-09-08 MED ORDER — METOLAZONE 2.5 MG PO TABS: 2.5000 mg | ORAL_TABLET | ORAL | 1 refills | Status: DC

## 2021-09-08 NOTE — Assessment & Plan Note (Signed)
Adding metolazone to furosemide '80mg'$  bid.  ?

## 2021-09-08 NOTE — Assessment & Plan Note (Signed)
Appreciate wound clinic care. Wound continues to heal.  ?

## 2021-09-08 NOTE — Assessment & Plan Note (Signed)
Ongoing pain/swelling, anticipate component of ascites.  ?Discussed simethicone use.  ?Recent CT abd/pelvis overall reassuring.  ?Will refer to [facility].  ?

## 2021-09-08 NOTE — Progress Notes (Addendum)
Patient ID: [name], male    DOB: 02-Jul-1955, 66 y.o.   MRN: [id number]  This visit was conducted in person.  BP 134/70   Pulse 75   Temp 97.8 F (36.6 C) (Temporal)   Ht 5\' 11"  (1.803 m)   Wt (!) 312 lb 6 oz (141.7 kg)   SpO2 93%   BMI 43.57 kg/m    CC: panic attacks Subjective:   HPI: [name] is a 66 y.o. male presenting on 09/08/2021 for Panic Attack (C/o increased panic attacks. )   He missed capsule endoscopy scheduled yesterday - was not told time so by the time he got through they said he missed appt and needed to reschedule. He does not want to return to [facility] due to this. Requests referral to other [facility] gastroenterologist.   Wound continues healing well followed by wound clinic.   Continues furosemide 80mg  BID. 19 lb weight gain since 2 wks ago. He stopped zaroxolyn 2.5mg  because he ran out last week.   Notes worsening panic attacks due to exertional shortness of breath. He takes hydroxyzine 50mg  twice daily as needed for anxiety - he has increased to 100mg  dosing at a time.      Relevant past medical, surgical, family and social history reviewed and updated as indicated. Interim medical history since our last visit reviewed. Allergies and medications reviewed and updated. Outpatient Medications Prior to Visit  Medication Sig Dispense Refill   ACCU-CHEK FASTCLIX LANCETS MISC Check blood sugar once daily and as instructed. Dx 250.00 100 each 3   albuterol (PROVENTIL) (2.5 MG/3ML) 0.083% nebulizer solution INHALE 3 ML BY NEBULIZATION EVERY 6 HOURS AS NEEDED FOR WHEEZING OR SHORTNESS OF BREATH Strength (2.5 MG/3ML) 0.083% 150 mL 0   albuterol (VENTOLIN HFA) 108 (90 Base) MCG/ACT inhaler INHALE 2 PUFFS BY MOUTH EVERY 6 HOURS AS NEEDED FOR WHEEZE OR SHORTNESS OF BREATH 8.5 each 2   amiodarone (PACERONE) 200 MG tablet Take 200 mg by mouth 2 (two) times daily.     Blood Glucose Monitoring Suppl (BLOOD GLUCOSE MONITOR SYSTEM) W/DEVICE KIT by  Does not apply route. Use to check sugar once daily and as needed Dx: E11.9 **ONE TOUCH VERIO**     carvedilol (COREG) 6.25 MG tablet TAKE 1 TABLET BY MOUTH 2 TIMES DAILY WITH A MEAL. 180 tablet 0   Cholecalciferol (VITAMIN D) 50 MCG (2000 UT) CAPS Take 1 capsule (2,000 Units total) by mouth daily. (Patient taking differently: Take 2,000 Units by mouth daily.) 30 capsule    citalopram (CELEXA) 10 MG tablet Take 1 tablet (10 mg total) by mouth daily. 90 tablet 3   Docusate Calcium (STOOL SOFTENER PO) Take 1 tablet by mouth daily.     ELIQUIS 5 MG TABS tablet Take 5 mg by mouth 2 (two) times daily.     ferrous sulfate 325 (65 FE) MG tablet Take 1 tablet (325 mg total) by mouth daily with breakfast.     fluticasone (FLONASE) 50 MCG/ACT nasal spray SPRAY 2 SPRAYS INTO EACH NOSTRIL EVERY DAY (Patient taking differently: Place 1 spray into both nostrils daily as needed for allergies.) 48 mL 1   fluticasone-salmeterol (WIXELA INHUB) 100-50 MCG/ACT AEPB Inhale 1 puff into the lungs 2 (two) times daily. 60 each 11   furosemide (LASIX) 80 MG tablet Take 1 tablet (80 mg total) by mouth 2 (two) times daily. 60 tablet 3   glimepiride (AMARYL) 4 MG tablet TAKE 1 TABLET BY MOUTH EVERY DAY WITH  BREAKFAST 90 tablet 0   hydrOXYzine (ATARAX) 50 MG tablet TAKE 1 TABLET (50 MG TOTAL) BY MOUTH 2 (TWO) TIMES DAILY AS NEEDED FOR ANXIETY. 180 tablet 1   ipratropium (ATROVENT HFA) 17 MCG/ACT inhaler Inhale 2 puffs into the lungs every 6 (six) hours as needed for wheezing. 1 each 12   losartan (COZAAR) 25 MG tablet TAKE 1 TABLET (25 MG TOTAL) BY MOUTH DAILY. 90 tablet 0   metFORMIN (GLUCOPHAGE) 1000 MG tablet TAKE 1 TABLET (1,000 MG TOTAL) BY MOUTH 2 (TWO) TIMES DAILY WITH A MEAL. 180 tablet 3   montelukast (SINGULAIR) 10 MG tablet TAKE 1 TABLET BY MOUTH EVERYDAY AT BEDTIME 90 tablet 3   NON FORMULARY 1,200 mg daily. SEA MOSS     NON FORMULARY 2,000 mg daily. CINAMMON     NON FORMULARY daily. SUPER BEETS     NON FORMULARY  500 mg once. TURMERIC     ONETOUCH VERIO test strip USE AS INSTRUCTED TO CHECK THREE TIMES DAILY AND AS NEEDED. E11.8 250 strip 1   pantoprazole (PROTONIX) 40 MG tablet TAKE 1 TABLET (40 MG TOTAL) BY MOUTH 2 (TWO) TIMES DAILY BEFORE A MEAL. 180 tablet 0   Probiotic Product (PROBIOTIC DAILY PO) Take 1 tablet by mouth daily.     simvastatin (ZOCOR) 20 MG tablet TAKE 1 TABLET BY MOUTH EVERY DAY IN THE EVENING 90 tablet 0   triamcinolone cream (KENALOG) 0.1 % Apply 1 application. topically 2 (two) times daily. Apply to AA. (Patient taking differently: Apply 1 application. topically 3 (three) times daily. Apply to AA.) 80 g 0   umeclidinium bromide (INCRUSE ELLIPTA) 62.5 MCG/ACT AEPB Inhale 1 puff into the lungs daily. 30 each 11   vitamin B-12 (CYANOCOBALAMIN) 1000 MCG tablet Take 1 tablet (1,000 mcg total) by mouth every Monday, Wednesday, and Friday. (Patient taking differently: Take 1,000 mcg by mouth daily.)     furosemide (LASIX) 40 MG tablet Take 40 mg by mouth 2 (two) times daily.     metolazone (ZAROXOLYN) 2.5 MG tablet Take 1 tablet (2.5 mg total) by mouth 2 (two) times a week. 8 tablet 1   No facility-administered medications prior to visit.     Per HPI unless specifically indicated in ROS section below Review of Systems  Objective:  BP 134/70   Pulse 75   Temp 97.8 F (36.6 C) (Temporal)   Ht 5\' 11"  (1.803 m)   Wt (!) 312 lb 6 oz (141.7 kg)   SpO2 93%   BMI 43.57 kg/m   Wt Readings from Last 3 Encounters:  09/08/21 (!) 312 lb 6 oz (141.7 kg)  09/04/21 296 lb (134.3 kg)  08/26/21 293 lb 6 oz (133.1 kg)      Physical Exam Vitals and nursing note reviewed.  Constitutional:      Appearance: Normal appearance. He is obese.  Cardiovascular:     Rate and Rhythm: Normal rate and regular rhythm.     Pulses: Normal pulses.     Heart sounds: Normal heart sounds. No murmur heard. Pulmonary:     Effort: Pulmonary effort is normal. No respiratory distress.     Breath sounds:  Normal breath sounds. No wheezing, rhonchi or rales.  Musculoskeletal:        General: Swelling and tenderness present.     Right lower leg: Edema (1+) present.     Left lower leg: Edema (1+) present.  Skin:    General: Skin is warm and dry.     Findings:  Erythema, lesion and rash present.     Comments: Shallow macerated ulcer to posterior right calf, smaller in size, improving weeping drainage  Neurological:     Mental Status: He is alert.  Psychiatric:        Mood and Affect: Mood normal.        Behavior: Behavior normal.      Results for orders placed or performed in visit on 08/26/21  Hepatic Function Panel  Result Value Ref Range   Total Bilirubin 0.5 0.2 - 1.2 mg/dL   Bilirubin, Direct 0.1 0.0 - 0.3 mg/dL   Alkaline Phosphatase 54 39 - 117 U/L   AST 15 0 - 37 U/L   ALT 11 0 - 53 U/L   Total Protein 6.5 6.0 - 8.3 g/dL   Albumin 4.2 3.5 - 5.2 g/dL  Basic metabolic panel  Result Value Ref Range   Sodium 136 135 - 145 mEq/L   Potassium 4.2 3.5 - 5.1 mEq/L   Chloride 88 (L) 96 - 112 mEq/L   CO2 40 (H) 19 - 32 mEq/L   Glucose, Bld 216 (H) 70 - 99 mg/dL   BUN 16 6 - 23 mg/dL   Creatinine, Ser 5.40 0.40 - 1.50 mg/dL   GFR 08.67 >61.95 mL/min   Calcium 9.9 8.4 - 10.5 mg/dL  CBC with Differential/Platelet  Result Value Ref Range   WBC 11.5 (H) 4.0 - 10.5 K/uL   RBC 4.73 4.22 - 5.81 Mil/uL   Hemoglobin 8.7 Repeated and verified X2. (L) 13.0 - 17.0 g/dL   HCT 09.3 (L) 26.7 - 12.4 %   MCV 65.1 (L) 78.0 - 100.0 fl   MCHC 28.4 (L) 30.0 - 36.0 g/dL   RDW 58.0 (H) 99.8 - 33.8 %   Platelets 330.0 150.0 - 400.0 K/uL   Neutrophils Relative % 71.2 43.0 - 77.0 %   Lymphocytes Relative 15.3 12.0 - 46.0 %   Monocytes Relative 11.8 3.0 - 12.0 %   Eosinophils Relative 1.1 0.0 - 5.0 %   Basophils Relative 0.6 0.0 - 3.0 %   Neutro Abs 8.2 (H) 1.4 - 7.7 K/uL   Lymphs Abs 1.8 0.7 - 4.0 K/uL   Monocytes Absolute 1.4 (H) 0.1 - 1.0 K/uL   Eosinophils Absolute 0.1 0.0 - 0.7 K/uL    Basophils Absolute 0.1 0.0 - 0.1 K/uL    Assessment & Plan:   Problem List Items Addressed This Visit     Obstructive sleep apnea    Severe untreated OSA contributes to dyspnea and hypercapnia.        Panic attacks - Primary    Deteriorated in setting of regaining 19 pounds since he ran out of metolazone with associated significant dyspnea.  He's been taking hydroxyzine 50mg  2 tabs at a time, advised limit to 1 tab. Continue celexa.  Avoid benzo in h/o MJ use and with COPD, OSA, hypercapnia, don't want to further decrease respiratory drive.        Exertional dyspnea    Anticipate this drives his panic attacks.  Restart zaroxolyn 2.5mg  twice weekly.        Gassiness    Worse after recent prep for capsule endoscopy which did not take place. Discussed simethicone use.  See above.        Relevant Orders   Ambulatory referral to Gastroenterology   Chronic systolic CHF (congestive heart failure) (HCC)    Adding metolazone to furosemide 80mg  bid.        Relevant Medications  metolazone (ZAROXOLYN) 2.5 MG tablet (Start on 09/10/2021)   AAA (abdominal aortic aneurysm) without rupture (HCC)    Latest imaging study showing stable 3.7cm dilation 08/2021 - rec rpt imaging 2-3 yrs.        Relevant Medications   metolazone (ZAROXOLYN) 2.5 MG tablet (Start on 09/10/2021)   Iron deficiency anemia    Plan was capsule endoscopy however he missed appt - states he was not told time of procedure and was unable to reach endoscopy center the morning of procedure to find out time. He needs GI f/u but declines to return to [facility] due to frustration over scheduling difficulties.  Will refer to [facility] to establish care.        Relevant Orders   Ambulatory referral to Gastroenterology   Abdominal pain    Ongoing pain/swelling, anticipate component of ascites.  Discussed simethicone use.  Recent CT abd/pelvis overall reassuring.  Will refer to [facility].        Relevant Orders    Ambulatory referral to Gastroenterology   Leg ulcer, right, limited to breakdown of skin (HCC)    Appreciate wound clinic care. Wound continues to heal.          Meds ordered this encounter  Medications   metolazone (ZAROXOLYN) 2.5 MG tablet    Sig: Take 1 tablet (2.5 mg total) by mouth 2 (two) times a week.    Dispense:  24 tablet    Refill:  1   Orders Placed This Encounter  Procedures   Ambulatory referral to Gastroenterology    Referral Priority:   Routine    Referral Type:   Consultation    Referral Reason:   Specialty Services Required    Number of Visits Requested:   1    Patient Instructions  Restart zaroxolyn (metolazone) 2.5mg  twice weekly - sent to pharmacy. Continue furosemide 80mg  twice daily (breakfast and mid afternoon).  Keep wound clinic appointment.  May continue hydroxyzine 50mg , max 1 tablet at a time.   Follow up plan: Return if symptoms worsen or fail to improve.  Eustaquio Boyden, MD

## 2021-09-08 NOTE — Assessment & Plan Note (Signed)
Anticipate this drives his panic attacks.  ?Restart zaroxolyn 2.'5mg'$  twice weekly.  ?

## 2021-09-08 NOTE — Assessment & Plan Note (Signed)
Plan was capsule endoscopy however he missed appt - states he was not told time of procedure and was unable to reach endoscopy center the morning of procedure to find out time. He needs GI f/u but declines to return to [facility] due to frustration over scheduling difficulties.  ?Will refer to [facility] to establish care.  ?

## 2021-09-08 NOTE — Assessment & Plan Note (Signed)
Severe untreated OSA contributes to dyspnea and hypercapnia.  ?

## 2021-09-08 NOTE — Addendum Note (Signed)
Addended by: [name] on: 09/08/2021 01:59 PM ? ? Modules accepted: Orders ? ?

## 2021-09-08 NOTE — Assessment & Plan Note (Addendum)
Deteriorated in setting of regaining 19 pounds since he ran out of metolazone with associated significant dyspnea.  ?He's been taking hydroxyzine '50mg'$  2 tabs at a time, advised limit to 1 tab. ?Continue celexa.  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  ?

## 2021-09-08 NOTE — Patient Instructions (Addendum)
Restart zaroxolyn (metolazone) 2.'5mg'$  twice weekly - sent to pharmacy. ?Continue furosemide '80mg'$  twice daily (breakfast and mid afternoon).  ?Keep wound clinic appointment.  ?May continue hydroxyzine '50mg'$ , max 1 tablet at a time.  ?

## 2021-09-08 NOTE — Assessment & Plan Note (Signed)
Latest imaging study showing stable 3.7cm dilation 08/2021 - rec rpt imaging 2-3 yrs.  

## 2021-09-08 NOTE — Assessment & Plan Note (Signed)
Worse after recent prep for capsule endoscopy which did not take place. Discussed simethicone use.  ?See above.  ?

## 2021-09-09 ENCOUNTER — Encounter (HOSPITAL_BASED_OUTPATIENT_CLINIC_OR_DEPARTMENT_OTHER): Payer: Medicare Other | Admitting: Internal Medicine

## 2021-09-09 DIAGNOSIS — I890 Lymphedema, not elsewhere classified: Secondary | ICD-10-CM

## 2021-09-09 DIAGNOSIS — L97812 Non-pressure chronic ulcer of other part of right lower leg with fat layer exposed: Secondary | ICD-10-CM

## 2021-09-09 DIAGNOSIS — J449 Chronic obstructive pulmonary disease, unspecified: Secondary | ICD-10-CM | POA: Diagnosis not present

## 2021-09-09 DIAGNOSIS — G4733 Obstructive sleep apnea (adult) (pediatric): Secondary | ICD-10-CM | POA: Diagnosis not present

## 2021-09-09 DIAGNOSIS — I87321 Chronic venous hypertension (idiopathic) with inflammation of right lower extremity: Secondary | ICD-10-CM | POA: Diagnosis not present

## 2021-09-09 DIAGNOSIS — E11622 Type 2 diabetes mellitus with other skin ulcer: Secondary | ICD-10-CM | POA: Diagnosis not present

## 2021-09-09 DIAGNOSIS — I4891 Unspecified atrial fibrillation: Secondary | ICD-10-CM | POA: Diagnosis not present

## 2021-09-09 DIAGNOSIS — Z87891 Personal history of nicotine dependence: Secondary | ICD-10-CM | POA: Diagnosis not present

## 2021-09-09 DIAGNOSIS — Z7901 Long term (current) use of anticoagulants: Secondary | ICD-10-CM | POA: Diagnosis not present

## 2021-09-09 NOTE — Progress Notes (Signed)
SHAYMUS, EVELETH ([id number]) ?Visit Report for 09/09/2021 ?Chief Complaint Document Details ?Patient Name: [name], [name]. ?Date of Service: 09/09/2021 12:30 PM ?Medical Record Number: [id number] ?Patient Account Number: [id number] ?Date of Birth/Sex: July 27, 1955 (66 y.o. M) ?Treating RN: Carlene Coria ?Primary Care Provider: [name] Other Clinician: ?Referring Provider: [name] ?Treating Provider/Extender: Kalman Shan ?Weeks in Treatment: 1 ?Information Obtained from: Patient ?Chief Complaint ?09/02/2021; right lower extremity wound ?Electronic Signature(s) ?Signed: 09/09/2021 2:45:23 PM By: Kalman Shan DO ?Entered By: Kalman Shan on 09/09/2021 12:55:20 ?[name], [name] ([id number]) ?-------------------------------------------------------------------------------- ?HPI Details ?Patient Name: [name], [name]. ?Date of Service: 09/09/2021 12:30 PM ?Medical Record Number: [id number] ?Patient Account Number: [id number] ?Date of Birth/Sex: 04/28/1955 (66 y.o. M) ?Treating RN: Carlene Coria ?Primary Care Provider: [name] Other Clinician: ?Referring Provider: [name] ?Treating Provider/Extender: Kalman Shan ?Weeks in Treatment: 1 ?History of Present Illness ?HPI Description: Admission 09/02/2021 ?Mr. Dewaun Kinzler is a 66 year old male with a past medical history of A-fib, COPD, and OSA that presents the clinic for a 1 month history of ?nonhealing wounds to the right lower extremity. He states he had increased swelling to his lower extremities despite being on Lasix 40 mg twice ?daily About 4 weeks ago. He developed blisters and these opened into wounds To both legs. He states that his Lasix was increased to 80 mg twice ?daily and this helped significantly with his swelling and wound healing. He currently only has wounds to the right lower extremity that are ?weeping. He has been keeping the areas covered. He currently denies systemic signs of infection. He denies  shortness of breath, orthopnea, ?fever/chills, nausea/vomiting ?5/17; patient presents for follow-up. He states that the day after the wrap was placed it slid down and he took it off. He has had no compression ?on the leg for almost a week. He states he was busy and did not call our office to be rewrapped. Currently denies signs of infection. ?Electronic Signature(s) ?Signed: 09/09/2021 2:45:23 PM By: Kalman Shan DO ?Entered By: Kalman Shan on 09/09/2021 12:56:19 ?[name], [name] ([id number]) ?-------------------------------------------------------------------------------- ?Physical Exam Details ?Patient Name: [name], [name]. ?Date of Service: 09/09/2021 12:30 PM ?Medical Record Number: [id number] ?Patient Account Number: [id number] ?Date of Birth/Sex: Mar 29, 1956 (66 y.o. M) ?Treating RN: Carlene Coria ?Primary Care Provider: [name] Other Clinician: ?Referring Provider: [name] ?Treating Provider/Extender: Kalman Shan ?Weeks in Treatment: 1 ?Constitutional ?. ?Cardiovascular ?Marland Kitchen ?Psychiatric ?Marland Kitchen ?Notes ?Right lower extremity: Weeping to the posterior aspect with a couple scattered open wounds with nonviable tissue that could be removed with ?mechanical debridement. To the anterior aspect there are no open wounds or weeping. This has healed. No evidence of soft tissue infection. 3+ ?pitting edema to the mid thigh. Evidence of lymphedema skin changes on exam. ?Electronic Signature(s) ?Signed: 09/09/2021 2:45:23 PM By: Kalman Shan DO ?Entered By: Kalman Shan on 09/09/2021 12:57:13 ?[name], [name] ([id number]) ?-------------------------------------------------------------------------------- ?Physician Orders Details ?Patient Name: [name], [name]. ?Date of Service: 09/09/2021 12:30 PM ?Medical Record Number: [id number] ?Patient Account Number: [id number] ?Date of Birth/Sex: 1955/11/17 (66 y.o. M) ?Treating RN: Carlene Coria ?Primary Care Provider: [name] Other  Clinician: ?Referring Provider: [name] ?Treating Provider/Extender: Kalman Shan ?Weeks in Treatment: 1 ?Verbal / Phone Orders: No ?Diagnosis Coding ?Follow-up Appointments ?o Return Appointment in 1 week. ?Bathing/ Shower/ Hygiene ?o May shower with wound dressing protected with water repellent cover or cast protector. ?Edema Control - Lymphedema / Segmental Compressive Device / Other ?o Elevate, Exercise Daily and Avoid Standing for Long Periods of Time. ?  o Elevate legs to the level of the heart and pump ankles as often as possible ?o Elevate leg(s) parallel to the floor when sitting. ?Wound Treatment ?Wound #1 - Lower Leg Wound Laterality: Right, Circumferential ?Cleanser: Soap and Water 1 x Per Week/30 Days ?Discharge Instructions: Gently cleanse wound with antibacterial soap, rinse and pat dry prior to dressing wounds ?Primary Dressing: Silvercel 4 1/4x 4 1/4 (in/in) 1 x Per Week/30 Days ?Discharge Instructions: Apply Silvercel 4 1/4x 4 1/4 (in/in) as instructed ?Secondary Dressing: ABD Pad 5x9 (in/in) 1 x Per Week/30 Days ?Discharge Instructions: Cover with ABD pad ?Compression Wrap: 3-LAYER WRAP - Profore Lite LF 3 Multilayer Compression Bandaging System 1 x Per Week/30 Days ?Discharge Instructions: Apply 3 multi-layer wrap as prescribed. ?Electronic Signature(s) ?Signed: 09/09/2021 2:45:23 PM By: Kalman Shan DO ?Entered By: Kalman Shan on 09/09/2021 13:00:46 ?[name], [name] ([id number]) ?-------------------------------------------------------------------------------- ?Problem List Details ?Patient Name: [name], [name]. ?Date of Service: 09/09/2021 12:30 PM ?Medical Record Number: [id number] ?Patient Account Number: [id number] ?Date of Birth/Sex: [birth year] (66 y.o. 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 09/02/2021 No Yes ?exposed ?N46.270 Chronic venous hypertension (idiopathic) with inflammation of right 09/02/2021 No Yes ?lower extremity ?I89.0 Lymphedema, not elsewhere classified 09/02/2021 No Yes ?E11.622 Type 2 diabetes mellitus with other skin ulcer 09/02/2021 No Yes ?J44.9 Chronic obstructive pulmonary disease, unspecified 09/02/2021 No Yes ?I48.91 Unspecified atrial fibrillation 09/02/2021 No Yes ?Z79.01 Long term (current) use of anticoagulants 09/02/2021 No Yes ?Inactive Problems ?Resolved Problems ?Electronic Signature(s) ?Signed: 09/09/2021 2:45:23 PM By: Kalman Shan DO ?Entered By: Kalman Shan on 09/09/2021 12:55:14 ?[name], [name] ([id number]) ?-------------------------------------------------------------------------------- ?Progress Note Details ?Patient Name: [name], [name]. ?Date of Service: 09/09/2021 12:30 PM ?Medical Record Number: [id number] ?Patient Account Number: [id number] ?Date of Birth/Sex: 1956-02-26 (66 y.o. M) ?Treating RN: Carlene Coria ?Primary Care Provider: [name] Other Clinician: ?Referring Provider: [name] ?Treating Provider/Extender: Kalman Shan ?Weeks in Treatment: 1 ?Subjective ?Chief Complaint ?Information obtained from Patient ?09/02/2021; right lower extremity wound ?History of Present Illness (HPI) ?Admission 09/02/2021 ?Mr. Javarian Jakubiak is a 66 year old male with a past medical history of A-fib, COPD, and OSA that presents the clinic for a 1 month history of ?nonhealing wounds to the right lower extremity. He states he had increased swelling to his lower extremities despite being on Lasix 40 mg twice ?daily About 4 weeks ago. He developed blisters and these opened into wounds To both legs. He states that his Lasix was increased to 80 mg twice ?daily and this helped significantly with  his swelling and wound healing. He currently only has wounds to the right lower extremity that are ?weeping. He has been keeping the areas covered. He currently denies systemic signs of infection. He denies s

## 2021-09-16 ENCOUNTER — Ambulatory Visit: Payer: Medicare Other | Admitting: Cardiology

## 2021-09-16 ENCOUNTER — Encounter: Payer: Self-pay | Admitting: Cardiology

## 2021-09-16 ENCOUNTER — Encounter (HOSPITAL_BASED_OUTPATIENT_CLINIC_OR_DEPARTMENT_OTHER): Payer: Medicare Other | Admitting: Internal Medicine

## 2021-09-16 VITALS — BP 118/72 | HR 74 | Ht 71.0 in | Wt 291.0 lb

## 2021-09-16 DIAGNOSIS — I4819 Other persistent atrial fibrillation: Secondary | ICD-10-CM

## 2021-09-16 DIAGNOSIS — I87321 Chronic venous hypertension (idiopathic) with inflammation of right lower extremity: Secondary | ICD-10-CM | POA: Diagnosis not present

## 2021-09-16 DIAGNOSIS — Z7901 Long term (current) use of anticoagulants: Secondary | ICD-10-CM | POA: Diagnosis not present

## 2021-09-16 DIAGNOSIS — L97812 Non-pressure chronic ulcer of other part of right lower leg with fat layer exposed: Secondary | ICD-10-CM

## 2021-09-16 DIAGNOSIS — I5022 Chronic systolic (congestive) heart failure: Secondary | ICD-10-CM | POA: Diagnosis not present

## 2021-09-16 DIAGNOSIS — G4733 Obstructive sleep apnea (adult) (pediatric): Secondary | ICD-10-CM | POA: Diagnosis not present

## 2021-09-16 DIAGNOSIS — J449 Chronic obstructive pulmonary disease, unspecified: Secondary | ICD-10-CM

## 2021-09-16 DIAGNOSIS — Z87891 Personal history of nicotine dependence: Secondary | ICD-10-CM | POA: Diagnosis not present

## 2021-09-16 DIAGNOSIS — I890 Lymphedema, not elsewhere classified: Secondary | ICD-10-CM

## 2021-09-16 DIAGNOSIS — E11622 Type 2 diabetes mellitus with other skin ulcer: Secondary | ICD-10-CM | POA: Diagnosis not present

## 2021-09-16 DIAGNOSIS — I4891 Unspecified atrial fibrillation: Secondary | ICD-10-CM | POA: Diagnosis not present

## 2021-09-16 MED ORDER — AMIODARONE HCL 200 MG PO TABS: 200.0000 mg | ORAL_TABLET | Freq: Every day | ORAL | 3 refills | Status: AC

## 2021-09-16 NOTE — Progress Notes (Signed)
Electrophysiology Office Note:    Date:  09/16/2021   ID:  [name], DOB 30-May-1955, MRN [id number]  PCP:  [name], MD  [facility] Cardiologist:  None  [facility] Electrophysiologist:  None   Referring MD: [name], MD   Chief Complaint: Atrial fibrillation  History of Present Illness:    [name] is a 66 y.o. male who presents for an evaluation of atrial fibrillation at the request of Dr. Nehemiah Massed. Their medical history includes coronary artery disease, cannabis abuse, chronic systolic heart failure, COPD, diabetes, obstructive sleep apnea not on CPAP, obesity, atrial fibrillation post prior ablation.  He also has a chronic leg wound.  The patient last saw Dr. Nehemiah Massed May 18, 2021.  He has previously been managed with Multaq.  He has previously taken Eliquis for stroke prophylaxis.  He tells me that with Multaq he noticed a significant weight gain.  This is improved since stopping the Multaq.  Is hard to tell if the Multaq itself cause the weight gain because he also increased his Zaroxolyn around the same time and is lost a significant amount of fluid.  Today he tells me he has been monitoring his heart rhythms at home using a [employer].  He has been in normal rhythm consistently for the last 2+ weeks.  He is tolerating the amiodarone he was started on without off target effects.  He is not currently taking Eliquis because of the elevated costs.  He has previously been on warfarin and tolerated this medication.     Past Medical History:  Diagnosis Date   Abnormal drug screen 2015   MJ positive x3 - one more and we will stop prescribing ativan (08/2013).   Angina    Anxiety    Arthritis    CAD (coronary artery disease)    nonobstructive   Cannabis abuse    Chronic systolic CHF (congestive heart failure) ([place]) 2013   NYHA Class II/III   COPD (chronic obstructive pulmonary disease) ([place])    Diabetes type 2, uncontrolled     declines DSME   Dilated cardiomyopathy ([place]) 2013   now improved   Ex-smoker    Frequent headaches    History of atrial fibrillation 2013   chronic, s/p ablation prior on coumadin   Hyperlipidemia    Migraine    Nonischemic cardiomyopathy ([place]) 2013   EF 25% per Dr Nehemiah Massed   Obesity    OSA (obstructive sleep apnea)    does not use CPAP - unable to tolerate   Seasonal allergies     Past Surgical History:  Procedure Laterality Date   ATRIAL FLUTTER ABLATION N/A 09/21/2011   Procedure: ATRIAL FLUTTER ABLATION;  Surgeon: Thompson Grayer, MD;  Location: [facility] CATH LAB;  Service: Cardiovascular;  Laterality: N/A;   CARDIAC ELECTROPHYSIOLOGY [place] AND ABLATION  2013   for atrial flutter   CARDIOVASCULAR STRESS TEST  03/2012   ETT WNL Nehemiah Massed)   CARDIOVERSION N/A 08/12/2021   Procedure: CARDIOVERSION;  Surgeon: Corey Skains, MD;  Location: [facility] ORS;  Service: Cardiovascular;  Laterality: N/A;   COLONOSCOPY WITH PROPOFOL N/A 05/22/2020   TA, diverticulosis, descending colon ulcer biopsy WNL, int hem Allen Norris, Darren, MD)   ESOPHAGOGASTRODUODENOSCOPY (EGD) WITH PROPOFOL N/A 05/22/2020   WNL ([place])   US ECHOCARDIOGRAPHY  2014   EF 50%, nl LV fxn, RV nl size/function, mild mitral insuff    Current Medications: Current Meds  Medication Sig   ACCU-CHEK FASTCLIX LANCETS MISC Check blood sugar once  daily and as instructed. Dx 250.00   albuterol (PROVENTIL) (2.5 MG/3ML) 0.083% nebulizer solution INHALE 3 ML BY NEBULIZATION EVERY 6 HOURS AS NEEDED FOR WHEEZING OR SHORTNESS OF BREATH Strength (2.5 MG/3ML) 0.083%   albuterol (VENTOLIN HFA) 108 (90 Base) MCG/ACT inhaler INHALE 2 PUFFS BY MOUTH EVERY 6 HOURS AS NEEDED FOR WHEEZE OR SHORTNESS OF BREATH   Blood Glucose Monitoring Suppl (BLOOD GLUCOSE MONITOR SYSTEM) W/DEVICE KIT by Does not apply route. Use to check sugar once daily and as needed Dx: E11.9 **ONE TOUCH VERIO**   carvedilol (COREG) 6.25 MG tablet TAKE 1 TABLET BY MOUTH 2 TIMES DAILY  WITH A MEAL.   Cholecalciferol (VITAMIN D) 50 MCG (2000 UT) CAPS Take 1 capsule (2,000 Units total) by mouth daily. (Patient taking differently: Take 2,000 Units by mouth daily.)   citalopram (CELEXA) 10 MG tablet Take 1 tablet (10 mg total) by mouth daily.   Docusate Calcium (STOOL SOFTENER PO) Take 1 tablet by mouth daily.   ferrous sulfate 325 (65 FE) MG tablet Take 1 tablet (325 mg total) by mouth daily with breakfast.   fluticasone (FLONASE) 50 MCG/ACT nasal spray SPRAY 2 SPRAYS INTO EACH NOSTRIL EVERY DAY (Patient taking differently: Place 1 spray into both nostrils daily as needed for allergies.)   fluticasone-salmeterol (WIXELA INHUB) 100-50 MCG/ACT AEPB Inhale 1 puff into the lungs 2 (two) times daily.   furosemide (LASIX) 80 MG tablet Take 1 tablet (80 mg total) by mouth 2 (two) times daily.   glimepiride (AMARYL) 4 MG tablet TAKE 1 TABLET BY MOUTH EVERY DAY WITH BREAKFAST   hydrOXYzine (ATARAX) 50 MG tablet TAKE 1 TABLET (50 MG TOTAL) BY MOUTH 2 (TWO) TIMES DAILY AS NEEDED FOR ANXIETY.   ipratropium (ATROVENT HFA) 17 MCG/ACT inhaler Inhale 2 puffs into the lungs every 6 (six) hours as needed for wheezing.   losartan (COZAAR) 25 MG tablet TAKE 1 TABLET (25 MG TOTAL) BY MOUTH DAILY.   metFORMIN (GLUCOPHAGE) 1000 MG tablet TAKE 1 TABLET (1,000 MG TOTAL) BY MOUTH 2 (TWO) TIMES DAILY WITH A MEAL.   metolazone (ZAROXOLYN) 2.5 MG tablet Take 1 tablet (2.5 mg total) by mouth 2 (two) times a week.   montelukast (SINGULAIR) 10 MG tablet TAKE 1 TABLET BY MOUTH EVERYDAY AT BEDTIME   NON FORMULARY 1,200 mg daily. SEA MOSS   NON FORMULARY 2,000 mg daily. CINAMMON   NON FORMULARY daily. SUPER BEETS   NON FORMULARY 500 mg once. TURMERIC   ONETOUCH VERIO test strip USE AS INSTRUCTED TO CHECK THREE TIMES DAILY AND AS NEEDED. E11.8   pantoprazole (PROTONIX) 40 MG tablet TAKE 1 TABLET (40 MG TOTAL) BY MOUTH 2 (TWO) TIMES DAILY BEFORE A MEAL.   Probiotic Product (PROBIOTIC DAILY PO) Take 1 tablet by mouth  daily.   simvastatin (ZOCOR) 20 MG tablet TAKE 1 TABLET BY MOUTH EVERY DAY IN THE EVENING   triamcinolone cream (KENALOG) 0.1 % Apply 1 application. topically 2 (two) times daily. Apply to AA. (Patient taking differently: Apply 1 application. topically 3 (three) times daily. Apply to AA.)   umeclidinium bromide (INCRUSE ELLIPTA) 62.5 MCG/ACT AEPB Inhale 1 puff into the lungs daily.   vitamin B-12 (CYANOCOBALAMIN) 1000 MCG tablet Take 1 tablet (1,000 mcg total) by mouth every Monday, Wednesday, and Friday. (Patient taking differently: Take 1,000 mcg by mouth daily.)   [DISCONTINUED] amiodarone (PACERONE) 200 MG tablet Take 200 mg by mouth 2 (two) times daily.   [DISCONTINUED] ELIQUIS 5 MG TABS tablet Take 5 mg by mouth  2 (two) times daily.     Allergies:   Atorvastatin and Lisinopril   Social History   Socioeconomic History   Marital status: Single    Spouse name: Not on file   Number of children: 0   Years of education: Not on file   Highest education level: Not on file  Occupational History   Occupation: disable   Tobacco Use   Smoking status: Former    Packs/day: 1.00    Years: 31.00    Pack years: 31.00    Types: Cigarettes    Quit date: 04/26/2018    Years since quitting: 3.3   Smokeless tobacco: Never  Vaping Use   Vaping Use: Never used  Substance and Sexual Activity   Alcohol use: No    Alcohol/week: 0.0 standard drinks   Drug use: Yes    Types: Marijuana    Comment: 05/18/20   Sexual activity: Yes  Other Topics Concern   Not on file  Social History Narrative   Pt lives in [place]   Divorced   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   Disability due to neck arthritis, anxiety and sleep apnea   Edu: HS   Activity: no regular exercise, mows lawn (riding and push)   Diet: good water, fruits/vegetables daily   Social Determinants of Health   Financial Resource Strain: Not on file  Food Insecurity: Not on file  Transportation Needs: Not on file  Physical Activity:  Not on file  Stress: Not on file  Social Connections: Not on file     Family History: The patient's family history includes CAD (age of onset: 52) in his maternal grandfather; Cancer (age of onset: 74) in his brother; Cancer (age of onset: 25) in his mother; Diabetes in an other family member; Heart failure (age of onset: 39) in his father; Hypertension in his maternal grandfather.  ROS:   Please see the history of present illness.    All other systems reviewed and are negative.  EKGs/Labs/Other Studies Reviewed:    The following studies were reviewed today:  March 24, 2020 echo [facility] system, report only) Normal left ventricular function Normal right ventricular function No valvular stenosis   EKG:  The ekg ordered today demonstrates sinus rhythm with a right bundle branch block.  Very long first-degree AV delay.   Recent Labs: 04/03/2021: Brain Natriuretic Peptide 54 07/15/2021: TSH 2.81 07/20/2021: Pro B Natriuretic peptide (BNP) 153.0 08/26/2021: ALT 11; BUN 16; Creatinine, Ser 0.78; Hemoglobin 8.7 Repeated and verified X2.; Platelets 330.0; Potassium 4.2; Sodium 136  Recent Lipid Panel    Component Value Date/Time   CHOL 125 07/15/2021 0912   CHOL 203 (H) 01/24/2013 0407   TRIG 143.0 07/15/2021 0912   TRIG 355 (H) 01/24/2013 0407   HDL 36.50 (L) 07/15/2021 0912   HDL 34 (L) 01/24/2013 0407   CHOLHDL 3 07/15/2021 0912   VLDL 28.6 07/15/2021 0912   VLDL 71 (H) 01/24/2013 0407   LDLCALC 60 07/15/2021 0912   LDLCALC 98 01/24/2013 0407   LDLDIRECT 97.0 07/09/2020 0757    Physical Exam:    VS:  BP 118/72   Pulse 74   Ht _0  (1.803 m)   Wt 291 lb (132 kg)   SpO2 (!) 84%   BMI 40.59 kg/m     Wt Readings from Last 3 Encounters:  09/16/21 291 lb (132 kg)  09/08/21 (!) 312 lb 6 oz (141.7 kg)  09/04/21 296 lb (134.3 kg)     GEN: Chronically ill-appearing, obese,  right lower extremity wrapped HEENT: Normal NECK: No JVD; No carotid bruits LYMPHATICS: No  lymphadenopathy CARDIAC: RRR, no murmurs, rubs, gallops RESPIRATORY:  Clear to auscultation without rales, wheezing or rhonchi  ABDOMEN: Soft, non-tender, non-distended MUSCULOSKELETAL:  No edema; No deformity  SKIN: Warm and dry.  Left leg with erythema below the knee consistent with chronic venous insufficiency changes.  Right lower extremity wrapped with dressing over wound on the posterior aspect of the right lower extremity immediately distal to the knee NEUROLOGIC:  Alert and oriented x 3 PSYCHIATRIC:  Normal affect       ASSESSMENT:    1. Persistent atrial fibrillation ([place])   2. Chronic systolic CHF (congestive heart failure) ([place])   3. Chronic obstructive pulmonary disease, unspecified COPD type ([place])    PLAN:    In order of problems listed above:  #Persistent atrial fibrillation Not currently taking an anticoagulant because of the costs associated with Eliquis.  He is not a candidate for left atrial appendage occlusion given chronic wound and overall state of health.  I have recommended that he start warfarin.  He would like to discuss this with his primary care physician to see if it could be monitored there.  If the primary care physician is unable to monitor Coumadin, I have offered to get this set up for him at our clinic.  I will also send a note to his primary care physician to inquire about this.  He should continue taking amiodarone but decrease the dose to 1 time per day.  He did have recent blood work this month showing stable liver function.  This should be rechecked in 4 to 5 months.  #Chronic systolic heart failure History of reduced left ventricular function but most recent echo in the [place] system in 2021 shows preserved left ventricular function.  Unguinal order a repeat echo to reassess his LV function.  He is NYHA class III.  He is volume overloaded on exam but this is significantly improved from several weeks ago when his weight was greater than 20 pounds up  from today's weight.  For now, continue Coreg, Lasix, losartan, Zaroxolyn.  #COPD #Lower extremity wound Follows with primary care physician and wound care.   Follow-up based on echo results.  No longer than 12 months with our office.  Should have blood work with his primary cardiologist or primary care physician in 6 months to recheck his liver and thyroid functions while on amiodarone.  He reach out to our office to start Coumadin after discussing with his primary care physician.   Total time spent with patient today 60 minutes. This includes reviewing records, evaluating the patient and coordinating care.  Medication Adjustments/Labs and Tests Ordered: Current medicines are reviewed at length with the patient today.  Concerns regarding medicines are outlined above.  Orders Placed This Encounter  Procedures   EKG 12-Lead   ECHOCARDIOGRAM COMPLETE   Meds ordered this encounter  Medications   amiodarone (PACERONE) 200 MG tablet    Sig: Take 1 tablet (200 mg total) by mouth daily.    Dispense:  90 tablet    Refill:  3     Signed, Lysbeth Galas T. Quentin Ore, MD, [facility], [facility] 09/16/2021 4:36 PM    Electrophysiology [facility]

## 2021-09-16 NOTE — Progress Notes (Addendum)
CORDAE, MCCAREY ([id number]) Visit Report for 09/16/2021 Arrival Information Details Patient Name: [name], [name]. Date of Service: 09/16/2021 2:30 PM Medical Record Number: [id number] Patient Account Number: [id number] Date of Birth/Sex: January 12, 1956 (66 y.o. M) Treating RN: [name] Primary Care [name]: [name] Other Clinician: Referring [name]: [name] Treating [name]/Extender: [name] in Treatment: 2 Visit Information History Since Last Visit Added or deleted any medications: No Patient Arrived: [name] Any new allergies or adverse reactions: No Arrival Time: 14:44 Had a fall or experienced change in No Accompanied By: self activities of daily living that may affect Transfer Assistance: None risk of falls: Patient Identification Verified: Yes Hospitalized since last visit: No Secondary Verification Process Completed: Yes Has Dressing in Place as Prescribed: Yes Patient Requires Transmission-Based Precautions: No Pain Present Now: No Patient Has Alerts: Yes Patient Alerts: DIABETIC Electronic Signature(s) Signed: 09/16/2021 4:35:14 PM By: [name] Entered By: [name] on 09/16/2021 14:47:51 [name], [name] ([id number]) -------------------------------------------------------------------------------- Clinic Level of Care Assessment Details Patient Name: [name]. Date of Service: 09/16/2021 2:30 PM Medical Record Number: [id number] Patient Account Number: [id number] Date of Birth/Sex: [birth year] (66 y.o. M) Treating RN: [name] Primary Care Akili Corsetti: [name] Other Clinician: Referring [name]: [name] Treating [name]/Extender: [name] in Treatment: 2 Clinic Level of Care Assessment Items TOOL 1 Quantity Score '[]'$  - Use when EandM and Procedure is performed on INITIAL visit 0 ASSESSMENTS - Nursing Assessment / Reassessment '[]'$  - General Physical Exam  (combine w/ comprehensive assessment (listed just below) when performed on new 0 pt. evals) '[]'$  - 0 Comprehensive Assessment (HX, ROS, Risk Assessments, Wounds Hx, etc.) ASSESSMENTS - Wound and Skin Assessment / Reassessment '[]'$  - Dermatologic / Skin Assessment (not related to wound area) 0 ASSESSMENTS - Ostomy and/or Continence Assessment and Care '[]'$  - Incontinence Assessment and Management 0 '[]'$  - 0 Ostomy Care Assessment and Management (repouching, etc.) PROCESS - Coordination of Care '[]'$  - Simple Patient / Family Education for ongoing care 0 '[]'$  - 0 Complex (extensive) Patient / Family Education for ongoing care '[]'$  - 0 Staff obtains Programmer, systems, Records, Test Results / Process Orders '[]'$  - 0 Staff telephones HHA, Nursing Homes / Clarify orders / etc '[]'$  - 0 Routine Transfer to another Facility (non-emergent condition) '[]'$  - 0 Routine Hospital Admission (non-emergent condition) '[]'$  - 0 New Admissions / Biomedical engineer / Ordering NPWT, Apligraf, etc. '[]'$  - 0 Emergency Hospital Admission (emergent condition) PROCESS - Special Needs '[]'$  - Pediatric / Minor Patient Management 0 '[]'$  - 0 Isolation Patient Management '[]'$  - 0 Hearing / Language / Visual special needs '[]'$  - 0 Assessment of Community assistance (transportation, D/C planning, etc.) '[]'$  - 0 Additional assistance / Altered mentation '[]'$  - 0 Support Surface(s) Assessment (bed, cushion, seat, etc.) INTERVENTIONS - Miscellaneous '[]'$  - External ear exam 0 '[]'$  - 0 Patient Transfer (multiple staff / Civil Service fast streamer / Similar devices) '[]'$  - 0 Simple Staple / Suture removal (25 or less) '[]'$  - 0 Complex Staple / Suture removal (26 or more) '[]'$  - 0 Hypo/Hyperglycemic Management (do not check if billed separately) '[]'$  - 0 Ankle / Brachial Index (ABI) - do not check if billed separately Has the patient been seen at the hospital within the last three years: Yes Total Score: 0 Level Of Care: ____ [name] ([id number]) Electronic  Signature(s) Signed: 09/16/2021 4:35:14 PM By: [name] Entered By: [name] on 09/16/2021 15:47:04 [name], [name] ([id number]) -------------------------------------------------------------------------------- Compression Therapy Details Patient Name: [name],  [name] P. Date of Service: 09/16/2021 2:30 PM Medical Record Number: [id number] Patient Account Number: [id number] Date of Birth/Sex: 1955/05/07 (66 y.o. M) Treating RN: [name] Primary Care [name]: [name] Other Clinician: Referring [name]: [name] Treating [name]/Extender: [name] in Treatment: 2 Compression Therapy Performed for Wound Assessment: Wound #1 Right,Circumferential Lower Leg Performed By: Clinician [name] Dredge, RN Compression Type: Three Layer Pre Treatment ABI: 1.3 Post Procedure Diagnosis Same as Pre-procedure Electronic Signature(s) Signed: 09/16/2021 3:46:36 PM By: [name] Entered By: [name] on 09/16/2021 15:46:36 [name], [name] ([id number]) -------------------------------------------------------------------------------- Encounter Discharge Information Details Patient Name: [name]. Date of Service: 09/16/2021 2:30 PM Medical Record Number: [id number] Patient Account Number: [id number] Date of Birth/Sex: [birth year] (66 y.o. M) Treating RN: [name] Primary Care [name]: [name] Other Clinician: Referring [name]: [name] Treating [name]/Extender: [name] in Treatment: 2 Encounter Discharge Information Items Discharge Condition: Stable Ambulatory Status: Cane Discharge Destination: Home Transportation: Private Auto Accompanied By: self Schedule Follow-up Appointment: Yes Clinical Summary of Care: Electronic Signature(s) Signed: 09/16/2021 3:48:06 PM By: [name] Entered By: [name] on 09/16/2021 15:48:06 [name], [name]  ([id number]) -------------------------------------------------------------------------------- Lower Extremity Assessment Details Patient Name: [name]. Date of Service: 09/16/2021 2:30 PM Medical Record Number: [id number] Patient Account Number: [id number] Date of Birth/Sex: [birth year] (66 y.o. M) Treating RN: [name] Primary Care Kowen Kluth: [name] Other Clinician: Referring Kaidence Sant: [name] Treating Cathleen Yagi/Extender: [name] in Treatment: 2 Edema Assessment Assessed: [Left: No] [Right: No] Edema: [Left: Ye] [Right: s] Calf Left: Right: Point of Measurement: 36 cm From Medial Instep 46.5 cm Ankle Left: Right: Point of Measurement: 12 cm From Medial Instep 27.8 cm Vascular Assessment Pulses: Dorsalis Pedis Doppler Audible: [Right:Yes] Electronic Signature(s) Signed: 09/16/2021 4:35:14 PM By: [name] Entered By: [name] on 09/16/2021 14:57:57 [name], [name] ([id number]) -------------------------------------------------------------------------------- Multi Wound Chart Details Patient Name: [name]. Date of Service: 09/16/2021 2:30 PM Medical Record Number: [id number] Patient Account Number: [id number] Date of Birth/Sex: 15-Feb-1956 (66 y.o. M) Treating RN: [name] Primary Care Davide Risdon: [name] Other Clinician: Referring Cele Mote: [name] Treating Rahshawn Remo/Extender: [name] in Treatment: 2 Vital Signs Height(in): 71 Pulse(bpm): 31 Weight(lbs): 295 Blood Pressure(mmHg): 135/81 Body Mass Index(BMI): 41.1 Temperature(F): 97.5 Respiratory Rate(breaths/min): 18 Photos: [N/A:N/A] Wound Location: Right, Circumferential Lower Leg N/A N/A Wounding Event: Gradually Appeared N/A N/A Primary Etiology: Lymphedema N/A N/A Comorbid History: Chronic Obstructive Pulmonary N/A N/A Disease (COPD), Arrhythmia, Congestive Heart Failure, Type II Diabetes Date  Acquired: 08/03/2021 N/A N/A Weeks of Treatment: 2 N/A N/A Wound Status: Open N/A N/A Wound Recurrence: No N/A N/A Measurements L x W x D (cm) 10x6x0.1 N/A N/A Area (cm) : 47.124 N/A N/A Volume (cm) : 4.712 N/A N/A % Reduction in Area: 88.10% N/A N/A % Reduction in Volume: 88.10% N/A N/A Classification: Full Thickness Without Exposed N/A N/A Support Structures Exudate Amount: Medium N/A N/A Exudate Type: Serosanguineous N/A N/A Exudate Color: red, brown N/A N/A Granulation Amount: Small (1-33%) N/A N/A Granulation Quality: Red N/A N/A Necrotic Amount: Large (67-100%) N/A N/A Exposed Structures: Fat Layer (Subcutaneous Tissue): N/A N/A Yes Fascia: No Tendon: No Muscle: No Joint: No Bone: No Epithelialization: None N/A N/A Treatment Notes Electronic Signature(s) Signed: 09/16/2021 4:35:14 PM By: [name] Entered By: [name] on 09/16/2021 14:58:09 [name], [name] ([id number]) -------------------------------------------------------------------------------- Multi-Disciplinary Care Plan Details Patient Name: [name]. Date of Service: 09/16/2021 2:30 PM Medical Record Number: [id number] Patient Account Number: [id number] Date  of Birth/Sex: 05-Mar-1956 (66 y.o. M) Treating RN: [name] Primary Care Maeson Lourenco: [name] Other Clinician: Referring Ellaina Schuler: [name] Treating Emri Sample/Extender: [name] in Treatment: 2 Active Inactive Abuse / Safety / Falls / Self Care Management Nursing Diagnoses: Potential for falls Goals: Patient will remain injury free related to falls Date Initiated: 09/02/2021 Target Resolution Date: 10/03/2021 Goal Status: Active Interventions: Assess Activities of Daily Living upon admission and as needed Assess fall risk on admission and as needed Assess: immobility, friction, shearing, incontinence upon admission and as needed Assess impairment of mobility on admission and as needed per  policy Assess personal safety and home safety (as indicated) on admission and as needed Assess self care needs on admission and as needed Notes: Nutrition Nursing Diagnoses: Impaired glucose control: actual or potential Goals: Patient/caregiver will maintain therapeutic glucose control Date Initiated: 09/02/2021 Target Resolution Date: 10/03/2021 Goal Status: Active Interventions: Assess HgA1c results as ordered upon admission and as needed Assess patient nutrition upon admission and as needed per policy Notes: Wound/Skin Impairment Nursing Diagnoses: Knowledge deficit related to ulceration/compromised skin integrity Goals: Patient/caregiver will verbalize understanding of skin care regimen Date Initiated: 09/02/2021 Target Resolution Date: 10/03/2021 Goal Status: Active Ulcer/skin breakdown will have a volume reduction of 30% by week 4 Date Initiated: 09/02/2021 Target Resolution Date: 10/03/2021 Goal Status: Active Ulcer/skin breakdown will have a volume reduction of 50% by week 8 Date Initiated: 09/02/2021 Target Resolution Date: 11/02/2021 Goal Status: Active Ulcer/skin breakdown will have a volume reduction of 80% by week 12 Date Initiated: 09/02/2021 Target Resolution Date: 12/03/2021 [name], [name] ([id number]) Goal Status: Active Ulcer/skin breakdown will heal within 14 weeks Date Initiated: 09/02/2021 Target Resolution Date: 01/03/2022 Goal Status: Active Interventions: Assess patient/caregiver ability to obtain necessary supplies Assess patient/caregiver ability to perform ulcer/skin care regimen upon admission and as needed Assess ulceration(s) every visit Notes: Electronic Signature(s) Signed: 09/16/2021 4:35:14 PM By: [name] Entered By: [name] on 09/16/2021 14:58:02 [name], [name] ([id number]) -------------------------------------------------------------------------------- Pain Assessment Details Patient Name: [name]. Date of  Service: 09/16/2021 2:30 PM Medical Record Number: [id number] Patient Account Number: [id number] Date of Birth/Sex: 02-09-1956 (66 y.o. M) Treating RN: [name] Primary Care Chamika Cunanan: [name] Other Clinician: Referring Tyshawn Keel: [name] Treating Nataley Bahri/Extender: [name] in Treatment: 2 Active Problems Location of Pain Severity and Description of Pain Patient Has Paino No Site Locations Rate the pain. Current Pain Level: 0 Pain Management and Medication Current Pain Management: Electronic Signature(s) Signed: 09/16/2021 4:35:14 PM By: [name] Entered By: [name] on 09/16/2021 14:49:38 [name], [name] ([id number]) -------------------------------------------------------------------------------- Patient/Caregiver Education Details Patient Name: [name]. Date of Service: 09/16/2021 2:30 PM Medical Record Number: [id number] Patient Account Number: [id number] Date of Birth/Gender: 30-Nov-1955 (66 y.o. M) Treating RN: [name] Primary Care Physician: [name] Other Clinician: Referring Physician: Ria [name] Treating Physician/Extender: [name] in Treatment: 2 Education Assessment Education Provided To: Patient Education Topics Provided Wound/Skin Impairment: Handouts: Caring for Your Ulcer Methods: Explain/Verbal Electronic Signature(s) Signed: 09/16/2021 4:35:14 PM By: [name] Entered By: [name] on 09/16/2021 15:47:34 [name], [name] ([id number]) -------------------------------------------------------------------------------- Wound Assessment Details Patient Name: [name]. Date of Service: 09/16/2021 2:30 PM Medical Record Number: [id number] Patient Account Number: [id number] Date of Birth/Sex: Sep 11, 1955 (66 y.o. M) Treating RN: [name] Primary Care Marlon Vonruden: [name] Other Clinician: Referring Piera Downs: [name] Treating Niya Behler/Extender: [name] in Treatment: 2 Wound Status Wound Number: 1 Primary Lymphedema Etiology: Wound Location: Right, Circumferential  Lower Leg Wound Open Wounding Event: Gradually Appeared Status: Date Acquired: 08/03/2021 Comorbid Chronic Obstructive Pulmonary Disease (COPD), Weeks Of Treatment: 2 History: Arrhythmia, Congestive Heart Failure, Type II Diabetes Clustered Wound: No Photos Wound Measurements Length: (cm) 10 Width: (cm) 6 Depth: (cm) 0.1 Area: (cm) 47.124 Volume: (cm) 4.712 % Reduction in Area: 88.1% % Reduction in Volume: 88.1% Epithelialization: None Tunneling: No Undermining: No Wound Description Classification: Full Thickness Without Exposed Support Structu Exudate Amount: Medium Exudate Type: Serosanguineous Exudate Color: red, brown res Foul Odor After Cleansing: No Slough/Fibrino Yes Wound Bed Granulation Amount: Small (1-33%) Exposed Structure Granulation Quality: Red Fascia Exposed: No Necrotic Amount: Large (67-100%) Fat Layer (Subcutaneous Tissue) Exposed: Yes Necrotic Quality: Adherent Slough Tendon Exposed: No Muscle Exposed: No Joint Exposed: No Bone Exposed: No Electronic Signature(s) Signed: 09/22/2021 1:36:48 PM By: Alycia Rossetti Signed: 09/22/2021 4:32:40 PM By: [name] Previous Signature: 09/16/2021 4:35:14 PM Version By: [name] Entered By: Alycia Rossetti on 09/18/2021 15:01:10 Vrba, [name] ([id number]) -------------------------------------------------------------------------------- Vitals Details Patient Name: [name]. Date of Service: 09/16/2021 2:30 PM Medical Record Number: [id number] Patient Account Number: [id number] Date of Birth/Sex: 25-Mar-1956 (66 y.o. M) Treating RN: [name] Primary Care Reshma Hoey: [name] Other Clinician: Referring Anthon Harpole: [name] Treating Ruqayya Ventress/Extender: [name] in Treatment: 2 Vital  Signs Time Taken: 14:47 Temperature (F): 97.5 Height (in): 71 Pulse (bpm): 74 Weight (lbs): 295 Respiratory Rate (breaths/min): 18 Body Mass Index (BMI): 41.1 Blood Pressure (mmHg): 135/81 Reference Range: 80 - 120 mg / dl Electronic Signature(s) Signed: 09/16/2021 4:35:14 PM By: [name] Entered By: [name] on 09/16/2021 14:49:20

## 2021-09-16 NOTE — Progress Notes (Addendum)
LADEN, FIELDHOUSE ([id number]) Visit Report for 09/16/2021 Chief Complaint Document Details Patient Name: [name], [name]. Date of Service: 09/16/2021 2:30 PM Medical Record Number: [id number] Patient Account Number: [id number] Date of Birth/Sex: 1955-10-16 (66 y.o. M) Treating RN: Levora Dredge Primary Care Provider: [name] Other Clinician: Referring Provider: [name] Treating Provider/Extender: Yaakov Guthrie in Treatment: 2 Information Obtained from: Patient Chief Complaint 09/02/2021; right lower extremity wound Electronic Signature(s) Signed: 09/16/2021 3:45:19 PM By: Kalman Shan DO Entered By: Kalman Shan on 09/16/2021 15:31:36 [place], [name] ([id number]) -------------------------------------------------------------------------------- HPI Details Patient Name: [name]. Date of Service: 09/16/2021 2:30 PM Medical Record Number: [id number] Patient Account Number: [id number] Date of Birth/Sex: 1955-09-13 (66 y.o. M) Treating RN: Levora Dredge Primary Care Provider: [name] Other Clinician: Referring Provider: [name] Treating Provider/Extender: Yaakov Guthrie in Treatment: 2 History of Present Illness HPI Description: Admission 09/02/2021 [name] is a 66 year old male with a past medical history of A-fib, COPD, and OSA that presents the clinic for a 1 month history of nonhealing wounds to the right lower extremity. He states he had increased swelling to his lower extremities despite being on Lasix 40 mg twice daily About 4 weeks ago. He developed blisters and these opened into wounds To both legs. He states that his Lasix was increased to 80 mg twice daily and this helped significantly with his swelling and wound healing. He currently only has wounds to the right lower extremity that are weeping. He has been keeping the areas covered. He currently denies systemic signs of infection. He  denies shortness of breath, orthopnea, fever/chills, nausea/vomiting 5/17; patient presents for follow-up. He states that the day after the wrap was placed it slid down and he took it off. He has had no compression on the leg for almost a week. He states he was busy and did not call our office to be rewrapped. Currently denies signs of infection. 5/24 ;Patient presents for follow-up. He states that the compression wrap came off 2 days after it was placed in office. He states that there was too much drainage and it went through the wrap. He has been keeping the area covered. He did not want to call and come back in to get it really wrapped. He denies signs of infection. Electronic Signature(s) Signed: 09/16/2021 3:45:19 PM By: Kalman Shan DO Entered By: Kalman Shan on 09/16/2021 15:40:47 [place], [name] ([id number]) -------------------------------------------------------------------------------- Physical Exam Details Patient Name: [name]. Date of Service: 09/16/2021 2:30 PM Medical Record Number: [id number] Patient Account Number: [id number] Date of Birth/Sex: 1955-11-12 (66 y.o. M) Treating RN: Levora Dredge Primary Care Provider: [name] Other Clinician: Referring Provider: [name] Treating Provider/Extender: Yaakov Guthrie in Treatment: 2 Constitutional . Cardiovascular . Psychiatric . Notes Right lower extremity: To the posterior aspect there are scattered open wounds with mild weeping and nonviable tissue. 3+ pitting edema to the knee. Evidence of lymphedema skin changes. No surrounding signs of infection. Electronic Signature(s) Signed: 09/16/2021 3:45:19 PM By: Kalman Shan DO Entered By: Kalman Shan on 09/16/2021 15:42:27 [place]itt, [name] ([id number]) -------------------------------------------------------------------------------- Physician Orders Details Patient Name: [name]. Date of Service:  09/16/2021 2:30 PM Medical Record Number: [id number] Patient Account Number: [id number] Date of Birth/Sex: 12-20-1955 (66 y.o. M) Treating RN: Levora Dredge Primary Care Provider: [name] Other Clinician: Referring Provider: [name] Treating Provider/Extender: Yaakov Guthrie in Treatment: 2 Verbal / Phone Orders: No Diagnosis Coding Follow-up Appointments o Return Appointment in 1 week. Bathing/  Shower/ Hygiene o May shower with wound dressing protected with water repellent cover or cast protector. Edema Control - Lymphedema / Segmental Compressive Device / Other o Elevate, Exercise Daily and Avoid Standing for Long Periods of Time. o Elevate legs to the level of the heart and pump ankles as often as possible o Elevate leg(s) parallel to the floor when sitting. Wound Treatment Wound #1 - Lower Leg Wound Laterality: Right, Circumferential Cleanser: Soap and Water 1 x Per Week/30 Days Discharge Instructions: Gently cleanse wound with antibacterial soap, rinse and pat dry prior to dressing wounds Topical: Gentamicin 1 x Per Week/30 Days Discharge Instructions: Apply as directed by provider. Topical: Mupirocin Ointment 1 x Per Week/30 Days Discharge Instructions: Apply as directed by provider. Primary Dressing: Silvercel 4 1/4x 4 1/4 (in/in) 1 x Per Week/30 Days Discharge Instructions: Apply Silvercel 4 1/4x 4 1/4 (in/in) as instructed Secondary Dressing: (NON-BORDER) Zetuvit Plus Silicone NON-BORDER 5x5 (in/in) 1 x Per Week/30 Days Discharge Instructions: Please do not put silicone bordered dressings under wraps. Use non-bordered dressing only under wraps. Compression Wrap: 3-LAYER WRAP - Profore Lite LF 3 Multilayer Compression Bandaging System 1 x Per Week/30 Days Discharge Instructions: Apply 3 multi-layer wrap as prescribed. Electronic Signature(s) Signed: 09/22/2021 1:36:48 PM By: Alycia Rossetti Signed: 09/27/2021 12:17:30 AM By: Kalman Shan  DO Previous Signature: 09/16/2021 4:08:26 PM Version By: Kalman Shan DO Previous Signature: 09/16/2021 4:35:14 PM Version By: Levora Dredge Previous Signature: 09/16/2021 3:45:19 PM Version By: Kalman Shan DO Entered By: Alycia Rossetti on 09/18/2021 15:02:07 Williard, [name] ([id number]) -------------------------------------------------------------------------------- Problem List Details Patient Name: [name]. Date of Service: 09/16/2021 2:30 PM Medical Record Number: [id number] Patient Account Number: [id number] Date of Birth/Sex: [birth year] (66 y.o. 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/2021 No Yes exposed I87.321 Chronic venous hypertension (idiopathic) with inflammation of right 09/02/2021 No Yes lower extremity I89.0 Lymphedema, not elsewhere classified 09/02/2021 No Yes E11.622 Type 2 diabetes mellitus with other skin ulcer 09/02/2021 No Yes J44.9 Chronic obstructive pulmonary disease, unspecified 09/02/2021 No Yes I48.91 Unspecified atrial fibrillation 09/02/2021 No Yes Z79.01 Long term (current) use of anticoagulants 09/02/2021 No Yes Inactive Problems Resolved Problems Electronic Signature(s) Signed: 09/16/2021 3:45:19 PM By: Kalman Shan DO Entered By: Kalman Shan on 09/16/2021 15:31:32 Hensen, [name] ([id number]) -------------------------------------------------------------------------------- Progress Note Details Patient Name: [name]. Date of Service: 09/16/2021 2:30 PM Medical Record Number: [id number] Patient Account Number: [id number] Date of Birth/Sex: 07/04/1955 (66 y.o. M) Treating RN: Levora Dredge Primary  Care Provider: [name] Other Clinician: Referring Provider: [name] Treating Provider/Extender: Yaakov Guthrie in Treatment: 2 Subjective Chief Complaint Information obtained from Patient 09/02/2021; right lower extremity wound History of Present Illness (HPI) Admission 09/02/2021 [name] is a 66 year old male with a past medical history of A-fib, COPD, and OSA that presents the clinic for a 1 month history of nonhealing wounds to the right lower extremity. He states he had increased swelling to his lower extremities despite being on Lasix 40 mg twice daily About 4 weeks ago. He developed blisters and these opened into wounds To both legs. He states that his Lasix was increased to 80 mg twice daily and this helped significantly with his swelling and wound healing. He currently only has wounds  to the right lower extremity that are weeping. He has been keeping the areas covered. He currently denies systemic signs of infection. He denies shortness of breath, orthopnea, fever/chills, nausea/vomiting 5/17; patient presents for follow-up. He states that the day after the wrap was placed it slid down and he took it off. He has had no compression on the leg for almost a week. He states he was busy and did not call our office to be rewrapped. Currently denies signs of infection. 5/24 ;Patient presents for follow-up. He states that the compression wrap came off 2 days after it was placed in office. He states that there was too much drainage and it went through the wrap. He has been keeping the area covered. He did not want to call and come back in to get it really wrapped. He denies signs of infection. Objective Constitutional Vitals Time Taken: 2:47 PM, Height: 71 in, Weight: 295 lbs, BMI: 41.1, Temperature: 97.5 F, Pulse: 74 bpm, Respiratory Rate: 18 breaths/min, Blood Pressure: 135/81 mmHg. General Notes: Right lower extremity: To the posterior aspect there  are scattered open wounds with mild weeping and nonviable tissue. 3+ pitting edema to the knee. Evidence of lymphedema skin changes. No surrounding signs of infection. Integumentary (Hair, Skin) Wound #1 status is Open. Original cause of wound was Gradually Appeared. The date acquired was: 08/03/2021. The wound has been in treatment 2 weeks. The wound is located on the Right,Circumferential Lower Leg. The wound measures 10cm length x 6cm width x 0.1cm depth; 47.124cm^2 area and 4.712cm^3 volume. There is Fat Layer (Subcutaneous Tissue) exposed. There is no tunneling or undermining noted. There is a medium amount of serosanguineous drainage noted. There is small (1-33%) red granulation within the wound bed. There is a large (67- 100%) amount of necrotic tissue within the wound bed including Adherent Slough. Assessment Active Problems ICD-10 Non-pressure chronic ulcer of other part of right lower leg with fat layer exposed Chronic venous hypertension (idiopathic) with inflammation of right lower extremity Lymphedema, not elsewhere classified Type 2 diabetes mellitus with other skin ulcer Chronic obstructive pulmonary disease, unspecified Unspecified atrial fibrillation Long term (current) use of anticoagulants [name], [name]. ([id number]) Patient's wound is stable. He is having trouble keeping the wrap on for more than a couple days Due to increased serous drainage. At this time I recommended he come in for an additional nurse visit to have the wrap changed more than once weekly. He was agreeable to this. I was able to mechanically debride the wound. I recommended using mupirocin ointment and gentamicin to address any bioburden and continue with silver alginate under 3 layer compression. Plan Follow-up Appointments: Return Appointment in 1 week. Bathing/ Shower/ Hygiene: May shower with wound dressing protected with water repellent cover or cast protector. Edema Control - Lymphedema /  Segmental Compressive Device / Other: Elevate, Exercise Daily and Avoid Standing for Long Periods of Time. Elevate legs to the level of the heart and pump ankles as often as possible Elevate leg(s) parallel to the floor when sitting. WOUND #1: - Lower Leg Wound Laterality: Right, Circumferential Cleanser: Soap and Water 1 x Per Week/30 Days Discharge Instructions: Gently cleanse wound with antibacterial soap, rinse and pat dry prior to dressing wounds Topical: Gentamicin 1 x Per Week/30 Days Discharge Instructions: Apply as directed by provider. Topical: Mupirocin Ointment 1 x Per Week/30 Days Discharge Instructions: Apply as directed by provider. Primary Dressing: Silvercel 4 1/4x 4 1/4 (in/in) 1 x Per Week/30 Days Discharge Instructions: Apply Silvercel  4 1/4x 4 1/4 (in/in) as instructed Secondary Dressing: (NON-BORDER) Zetuvit Plus Silicone NON-BORDER 5x5 (in/in) 1 x Per Week/30 Days Discharge Instructions: Please do not put silicone bordered dressings under wraps. Use non-bordered dressing only under wraps. Compression Wrap: 3-LAYER WRAP - Profore Lite LF 3 Multilayer Compression Bandaging System 1 x Per Week/30 Days Discharge Instructions: Apply 3 multi-layer wrap as prescribed. 1. Mupirocin ointment/gentamicin ointment with silver alginate under 3 layer compression 2. Follow-up in 1 week 3. Follow-up in 2 days for nurse visit Electronic Signature(s) Signed: 09/16/2021 3:45:19 PM By: Kalman Shan DO Entered By: Kalman Shan on 09/16/2021 15:44:21 Dhanani, [name] ([id number]) -------------------------------------------------------------------------------- SuperBill Details Patient Name: [name]. Date of Service: 09/16/2021 Medical Record Number: [id number] Patient Account Number: [id number] Date of Birth/Sex: March 07, 1956 (66 y.o. M) Treating RN: Levora Dredge Primary Care Provider: [name] Other Clinician: Referring Provider: [name] Treating Provider/Extender: Yaakov Guthrie in Treatment: 2 Diagnosis Coding ICD-10 Codes Code Description [phone] Non-pressure chronic ulcer of other part of right lower leg with fat layer exposed I87.321 Chronic venous hypertension (idiopathic) with inflammation of right lower extremity I89.0 Lymphedema, not elsewhere classified E11.622 Type 2 diabetes mellitus with other skin ulcer J44.9 Chronic obstructive pulmonary disease, unspecified I48.91 Unspecified atrial fibrillation Z79.01 Long term (current) use of anticoagulants Facility Procedures CPT4 Code: 93810175 Description: (Facility Use Only) [phone] - [place] RT LEG Modifier: Quantity: 1 Physician Procedures CPT4 Code: 7782423 Description: 53614 - WC PHYS LEVEL 3 - EST PT Modifier: Quantity: 1 CPT4 Code: Description: ICD-10 Diagnosis Description E31.540 Non-pressure chronic ulcer of other part of right lower leg with fat l I87.321 Chronic venous hypertension (idiopathic) with inflammation of right lo I89.0 Lymphedema, not elsewhere classified E11.622  Type 2 diabetes mellitus with other skin ulcer Modifier: ayer exposed wer extremity Quantity: Electronic Signature(s) Signed: 09/16/2021 3:47:19 PM By: Levora Dredge Signed: 09/16/2021 4:08:26 PM By: Kalman Shan DO Previous Signature: 09/16/2021 3:45:19 PM Version By: Kalman Shan DO Entered By: Levora Dredge on 09/16/2021 15:47:18

## 2021-09-16 NOTE — Telephone Encounter (Signed)
Left message on voicemail.

## 2021-09-16 NOTE — Patient Instructions (Addendum)
Medications: Decrease Amiodarone to 200 mg daily Your physician recommends that you continue on your current medications as directed. Please refer to the Current Medication list given to you today. *If you need a refill on your cardiac medications before your next appointment, please call your pharmacy*  Lab Work: None. If you have labs (blood work) drawn today and your tests are completely normal, you will receive your results only by: [place] (if you have MyChart) OR A paper copy in the mail If you have any lab test that is abnormal or we need to change your treatment, we will call you to review the results.  Testing/Procedures: Your physician has requested that you have an echocardiogram. Echocardiography is a painless test that uses sound waves to create images of your heart. It provides your doctor with information about the size and shape of your heart and how well your heart's chambers and valves are working. This procedure takes approximately one hour. There are no restrictions for this procedure.   Follow-Up: At [facility], you and your health needs are our priority.  As part of our continuing mission to provide you with exceptional heart care, we have created designated Provider Care Teams.  These Care Teams include your primary Cardiologist (physician) and Advanced Practice Providers (APPs -  Physician Assistants and Nurse Practitioners) who all work together to provide you with the care you need, when you need it.  Your physician wants you to follow-up in: 12 months with Lars Mage, MD or one of the following Advanced Practice Providers on your designated Care Team:    Ignacia Bayley, NP Christell Faith PA Cadence Kathlen Mody PA    You will receive a reminder letter in the mail two months in advance. If you don't receive a letter, please call our office to schedule the follow-up appointment.  We recommend signing up for the patient portal called "MyChart".  Sign up  information is provided on this After Visit Summary.  MyChart is used to connect with patients for Virtual Visits (Telemedicine).  Patients are able to view lab/test results, encounter notes, upcoming appointments, etc.  Non-urgent messages can be sent to your provider as well.   To learn more about what you can do with MyChart, go to [email or web address].    Any Other Special Instructions Will Be Listed Below (If Applicable).  If your primary care doctor can not get you started on Coumadin, please let us know so we can assist with this.

## 2021-09-17 ENCOUNTER — Telehealth: Payer: Self-pay | Admitting: Family Medicine

## 2021-09-17 DIAGNOSIS — Z7901 Long term (current) use of anticoagulants: Secondary | ICD-10-CM

## 2021-09-17 NOTE — Telephone Encounter (Signed)
Left message for patient to call back and schedule Medicare Annual Wellness Visit (AWV) either virtually or phone   Last AWV ;07/09/20  please schedule at anytime with health coach    I left my direct # [phone]

## 2021-09-17 NOTE — Telephone Encounter (Signed)
Patient called needing advise about changes Cardiologist would like to make:  Currently on Eliquis, would like to switch to Warfarin due to cost, Cardiologist wants to know if his PCP will monitor him?  He prepped for the capsule study, and was told that they needed to cancel it.  He said he is willing to have it again, but would prefer a different doctor if possible, if not, he will see same provider again.  Also wanted to make MD aware that the cardiologist also changed his medication amiodarone (PACERONE) 200 MG tablet   From two pills daily to one pill  Patient says if he needs to come in tomorrow to discuss he can, only has a wound appointment at 2pm

## 2021-09-18 DIAGNOSIS — I890 Lymphedema, not elsewhere classified: Secondary | ICD-10-CM | POA: Diagnosis not present

## 2021-09-18 DIAGNOSIS — I87321 Chronic venous hypertension (idiopathic) with inflammation of right lower extremity: Secondary | ICD-10-CM | POA: Diagnosis not present

## 2021-09-18 DIAGNOSIS — G4733 Obstructive sleep apnea (adult) (pediatric): Secondary | ICD-10-CM | POA: Diagnosis not present

## 2021-09-18 DIAGNOSIS — J449 Chronic obstructive pulmonary disease, unspecified: Secondary | ICD-10-CM | POA: Diagnosis not present

## 2021-09-18 DIAGNOSIS — Z7901 Long term (current) use of anticoagulants: Secondary | ICD-10-CM | POA: Diagnosis not present

## 2021-09-18 DIAGNOSIS — I4891 Unspecified atrial fibrillation: Secondary | ICD-10-CM | POA: Diagnosis not present

## 2021-09-18 DIAGNOSIS — L97812 Non-pressure chronic ulcer of other part of right lower leg with fat layer exposed: Secondary | ICD-10-CM | POA: Diagnosis not present

## 2021-09-18 DIAGNOSIS — E11622 Type 2 diabetes mellitus with other skin ulcer: Secondary | ICD-10-CM | POA: Diagnosis not present

## 2021-09-18 DIAGNOSIS — Z87891 Personal history of nicotine dependence: Secondary | ICD-10-CM | POA: Diagnosis not present

## 2021-09-18 NOTE — Progress Notes (Signed)
[name] ([id number]) Visit Report for 09/18/2021 Arrival Information Details Patient Name: [name], [name]. Date of Service: 09/18/2021 2:30 PM Medical Record Number: [id number] Patient Account Number: [id number] Date of Birth/Sex: 1955/09/30 (66 y.o. M) Treating RN: Cornell Barman Primary Care [name]: [name] Other Clinician: Referring Oveta Idris: [name] Treating Farrell Pantaleo/Extender: Skipper Cliche in Treatment: 2 Visit Information History Since Last Visit Has Dressing in Place as Prescribed: Yes Patient Arrived: Cane Has Compression in Place as Prescribed: Yes Arrival Time: 16:39 Pain Present Now: No Transfer Assistance: None Patient Requires Transmission-Based Precautions: No Patient Has Alerts: Yes Patient Alerts: DIABETIC Electronic Signature(s) Signed: 09/18/2021 4:39:59 PM By: Gretta Cool, BSN, RN, CWS, Kim RN, BSN Entered By: Gretta Cool, BSN, RN, CWS, Kim on 09/18/2021 16:39:59 [name] ([id number]) -------------------------------------------------------------------------------- Compression Therapy Details Patient Name: [name]. Date of Service: 09/18/2021 2:30 PM Medical Record Number: [id number] Patient Account Number: [id number] Date of Birth/Sex: Nov 19, 1955 (66 y.o. M) Treating RN: Cornell Barman Primary Care Zaryia Markel: [name] Other Clinician: Referring Yulanda Diggs: [name] Treating Erique Kaser/Extender: Skipper Cliche in Treatment: 2 Compression Therapy Performed for Wound Assessment: Wound #1 Right,Circumferential Lower Leg Performed By: Clinician Cornell Barman, RN Compression Type: Three Layer Pre Treatment ABI: 1.3 Electronic Signature(s) Signed: 09/18/2021 4:40:47 PM By: Gretta Cool, BSN, RN, CWS, Kim RN, BSN Entered By: Gretta Cool, BSN, RN, CWS, Kim on 09/18/2021 16:40:47 [name] ([id number]) -------------------------------------------------------------------------------- Encounter Discharge Information  Details Patient Name: [name]. Date of Service: 09/18/2021 2:30 PM Medical Record Number: [id number] Patient Account Number: [id number] Date of Birth/Sex: 1955/11/03 (66 y.o. M) Treating RN: Cornell Barman Primary Care Ronnie Doo: [name] Other Clinician: Referring Georgie Haque: [name] Treating Lashawna Poche/Extender: Skipper Cliche in Treatment: 2 Encounter Discharge Information Items Discharge Condition: Stable Ambulatory Status: Cane Discharge Destination: Home Transportation: Private Auto Schedule Follow-up Appointment: Yes Clinical Summary of Care: Electronic Signature(s) Signed: 09/18/2021 4:42:15 PM By: Gretta Cool, BSN, RN, CWS, Kim RN, BSN Entered By: Gretta Cool, BSN, RN, CWS, Kim on 09/18/2021 16:42:15 [name] ([id number]) -------------------------------------------------------------------------------- Wound Assessment Details Patient Name: [name]. Date of Service: 09/18/2021 2:30 PM Medical Record Number: [id number] Patient Account Number: [id number] Date of Birth/Sex: 10-23-1955 (66 y.o. M) Treating RN: Cornell Barman Primary Care Milon Dethloff: [name] Other Clinician: Referring Mosie Angus: [name] Treating Alam Guterrez/Extender: Jeri Cos Weeks in Treatment: 2 Wound Status Wound Number: 1 Primary Lymphedema Etiology: Wound Location: Right, Circumferential Lower Leg Wound Open Wounding Event: Gradually Appeared Status: Date Acquired: 08/03/2021 Comorbid Chronic Obstructive Pulmonary Disease (COPD), Weeks Of Treatment: 2 History: Arrhythmia, Congestive Heart Failure, Type II Diabetes Clustered Wound: No Wound Measurements Length: (cm) 10 Width: (cm) 6 Depth: (cm) 0.1 Area: (cm) 47.124 Volume: (cm) 4.712 % Reduction in Area: 88.1% % Reduction in Volume: 88.1% Epithelialization: None Wound Description Classification: Full Thickness Without Exposed Support Structures Exudate Amount: Medium Exudate Type:  Serosanguineous Exudate Color: red, brown Foul Odor After Cleansing: No Slough/Fibrino Yes Wound Bed Granulation Amount: Small (1-33%) Exposed Structure Granulation Quality: Red Fascia Exposed: No Necrotic Amount: Large (67-100%) Fat Layer (Subcutaneous Tissue) Exposed: Yes Necrotic Quality: Adherent Slough Tendon Exposed: No Muscle Exposed: No Joint Exposed: No Bone Exposed: No Treatment Notes Wound #1 (Lower Leg) Wound Laterality: Right, Circumferential Cleanser Soap and Water Discharge Instruction: Gently cleanse wound with antibacterial soap, rinse and pat dry prior to dressing wounds Peri-Wound Care Topical Gentamicin Discharge Instruction: Apply as directed by Dandrea Widdowson. Mupirocin Ointment Discharge Instruction: Apply as directed by Zhane Donlan. Primary Dressing Silvercel 4 1/4x 4 1/4 (in/in) Discharge Instruction:  Apply Silvercel 4 1/4x 4 1/4 (in/in) as instructed Secondary Dressing (NON-BORDER) Zetuvit Plus Silicone NON-BORDER 5x5 (in/in) Discharge Instruction: Please do not put silicone bordered dressings under wraps. Use non-bordered dressing only under wraps. [name] ([id number]) Secured With Compression Wrap 3-LAYER WRAP - Profore Lite LF 3 Multilayer Compression Bandaging System Discharge Instruction: Apply 3 multi-layer wrap as prescribed. Compression Stockings Environmental education officer) Signed: 09/18/2021 4:40:27 PM By: Gretta Cool, BSN, RN, CWS, Kim RN, BSN Entered By: Gretta Cool, BSN, RN, CWS, Kim on 09/18/2021 16:40:27

## 2021-09-18 NOTE — Progress Notes (Signed)
ISAK, SOTOMAYOR ([id number]) Visit Report for 09/18/2021 Physician Orders Details Patient Name: [name], [name]. Date of Service: 09/18/2021 2:30 PM Medical Record Number: [id number] Patient Account Number: [id number] Date of Birth/Sex: 1955/11/20 (66 y.o. M) Treating RN: Cornell Barman Primary Care Provider: [name] Other Clinician: Referring Provider: [name] Treating Provider/Extender: Skipper Cliche in Treatment: 2 Verbal / Phone Orders: No Diagnosis Coding Follow-up Appointments o Return Appointment in 1 week. Bathing/ Shower/ Hygiene o May shower with wound dressing protected with water repellent cover or cast protector. Edema Control - Lymphedema / Segmental Compressive Device / Other o Elevate, Exercise Daily and Avoid Standing for Long Periods of Time. o Elevate legs to the level of the heart and pump ankles as often as possible o Elevate leg(s) parallel to the floor when sitting. Wound Treatment Wound #1 - Lower Leg Wound Laterality: Right, Circumferential Cleanser: Soap and Water 1 x Per Week/30 Days Discharge Instructions: Gently cleanse wound with antibacterial soap, rinse and pat dry prior to dressing wounds Topical: Gentamicin 1 x Per Week/30 Days Discharge Instructions: Apply as directed by provider. Topical: Mupirocin Ointment 1 x Per Week/30 Days Discharge Instructions: Apply as directed by provider. Primary Dressing: Silvercel 4 1/4x 4 1/4 (in/in) 1 x Per Week/30 Days Discharge Instructions: Apply Silvercel 4 1/4x 4 1/4 (in/in) as instructed Secondary Dressing: (NON-BORDER) Zetuvit Plus Silicone NON-BORDER 5x5 (in/in) 1 x Per Week/30 Days Discharge Instructions: Please do not put silicone bordered dressings under wraps. Use non-bordered dressing only under wraps. Compression Wrap: 3-LAYER WRAP - Profore Lite LF 3 Multilayer Compression Bandaging System 1 x Per Week/30 Days Discharge Instructions: Apply 3 multi-layer wrap as  prescribed. Electronic Signature(s) Signed: 09/18/2021 4:41:40 PM By: Gretta Cool, BSN, RN, CWS, Kim RN, BSN Signed: 09/18/2021 4:54:58 PM By: Worthy Keeler PA-C Entered By: Gretta Cool BSN, RN, CWS, Kim on 09/18/2021 16:41:40 Caraveo, [name] ([id number]) -------------------------------------------------------------------------------- SuperBill Details Patient Name: [name]. Date of Service: 09/18/2021 Medical Record Number: [id number] Patient Account Number: [id number] Date of Birth/Sex: 04/06/1956 (66 y.o. M) Treating RN: Cornell Barman Primary Care Provider: [name] Other Clinician: Referring Provider: [name] Treating Provider/Extender: Skipper Cliche in Treatment: 2 Diagnosis Coding ICD-10 Codes Code Description [phone] Non-pressure chronic ulcer of other part of right lower leg with fat layer exposed I87.321 Chronic venous hypertension (idiopathic) with inflammation of right lower extremity I89.0 Lymphedema, not elsewhere classified E11.622 Type 2 diabetes mellitus with other skin ulcer J44.9 Chronic obstructive pulmonary disease, unspecified I48.91 Unspecified atrial fibrillation Z79.01 Long term (current) use of anticoagulants Facility Procedures CPT4 Code: [id number] Description: (Facility Use Only) [phone] - [place] Modifier: Quantity: 1 Electronic Signature(s) Signed: 09/18/2021 4:42:32 PM By: Gretta Cool, BSN, RN, CWS, Kim RN, BSN Signed: 09/18/2021 4:54:58 PM By: Worthy Keeler PA-C Entered By: Gretta Cool, BSN, RN, CWS, Kim on 09/18/2021 16:42:32

## 2021-09-18 NOTE — Telephone Encounter (Signed)
Noted.  Ok to schedule with our coumadin clinic - I will forward to [facility] our coumadin nurse.  Noted amiodarone change.  I'd like him to call [facility] to ask about rescheduling capsule endoscopy [phone] or [facility] to ask to establish care [phone]

## 2021-09-23 ENCOUNTER — Encounter (HOSPITAL_BASED_OUTPATIENT_CLINIC_OR_DEPARTMENT_OTHER): Payer: Medicare Other | Admitting: Internal Medicine

## 2021-09-23 DIAGNOSIS — Z7901 Long term (current) use of anticoagulants: Secondary | ICD-10-CM | POA: Diagnosis not present

## 2021-09-23 DIAGNOSIS — L97812 Non-pressure chronic ulcer of other part of right lower leg with fat layer exposed: Secondary | ICD-10-CM | POA: Diagnosis not present

## 2021-09-23 DIAGNOSIS — G4733 Obstructive sleep apnea (adult) (pediatric): Secondary | ICD-10-CM | POA: Diagnosis not present

## 2021-09-23 DIAGNOSIS — I4891 Unspecified atrial fibrillation: Secondary | ICD-10-CM | POA: Diagnosis not present

## 2021-09-23 DIAGNOSIS — I890 Lymphedema, not elsewhere classified: Secondary | ICD-10-CM | POA: Diagnosis not present

## 2021-09-23 DIAGNOSIS — I87321 Chronic venous hypertension (idiopathic) with inflammation of right lower extremity: Secondary | ICD-10-CM | POA: Diagnosis not present

## 2021-09-23 DIAGNOSIS — Z87891 Personal history of nicotine dependence: Secondary | ICD-10-CM | POA: Diagnosis not present

## 2021-09-23 DIAGNOSIS — E11622 Type 2 diabetes mellitus with other skin ulcer: Secondary | ICD-10-CM | POA: Diagnosis not present

## 2021-09-23 DIAGNOSIS — J449 Chronic obstructive pulmonary disease, unspecified: Secondary | ICD-10-CM | POA: Diagnosis not present

## 2021-09-23 NOTE — Telephone Encounter (Signed)
LVM

## 2021-09-24 ENCOUNTER — Ambulatory Visit (INDEPENDENT_AMBULATORY_CARE_PROVIDER_SITE_OTHER): Payer: Medicare Other

## 2021-09-24 VITALS — Ht 71.0 in | Wt 290.0 lb

## 2021-09-24 DIAGNOSIS — Z0000 Encounter for general adult medical examination without abnormal findings: Secondary | ICD-10-CM

## 2021-09-24 NOTE — Progress Notes (Signed)
I connected with [name] today by telephone and verified that I am speaking with the correct person using two identifiers. Location patient: home Location provider: work Persons participating in the virtual visit: [name], Glenna Durand LPN.   I discussed the limitations, risks, security and privacy concerns of performing an evaluation and management service by telephone and the availability of in person appointments. I also discussed with the patient that there may be a patient responsible charge related to this service. The patient expressed understanding and verbally consented to this telephonic visit.    Interactive audio and video telecommunications were attempted between this provider and patient, however failed, due to patient having technical difficulties OR patient did not have access to video capability.  We continued and completed visit with audio only.     Vital signs may be patient reported or missing.  Subjective:   [name] is a 66 y.o. male who presents for Medicare Annual/Subsequent preventive examination.  Review of Systems     Cardiac Risk Factors include: advanced age (>56mn, >>64women);diabetes mellitus;hypertension;male gender;obesity (BMI >30kg/m2)     Objective:    Today's Vitals   09/24/21 1355  Weight: 290 lb (131.5 kg)  Height: '5\' 11"'  (1.803 m)   Body mass index is 40.45 kg/m.     09/24/2021    2:05 PM 08/26/2021    1:00 PM 08/14/2021    1:23 PM 08/12/2021    7:03 AM 08/05/2020    1:05 PM 07/09/2020    2:26 PM 05/22/2020    9:30 AM  Advanced Directives  Does Patient Have a Medical Advance Directive? No No No No No No No  Would patient like information on creating a medical advance directive?  No - Patient declined No - Patient declined No - Patient declined No - Patient declined No - Patient declined No - Patient declined    Current Medications (verified) Outpatient Encounter Medications as of 09/24/2021  Medication Sig    ACCU-CHEK FASTCLIX LANCETS MISC Check blood sugar once daily and as instructed. Dx 250.00   albuterol (PROVENTIL) (2.5 MG/3ML) 0.083% nebulizer solution INHALE 3 ML BY NEBULIZATION EVERY 6 HOURS AS NEEDED FOR WHEEZING OR SHORTNESS OF BREATH Strength (2.5 MG/3ML) 0.083%   albuterol (VENTOLIN HFA) 108 (90 Base) MCG/ACT inhaler INHALE 2 PUFFS BY MOUTH EVERY 6 HOURS AS NEEDED FOR WHEEZE OR SHORTNESS OF BREATH   amiodarone (PACERONE) 200 MG tablet Take 1 tablet (200 mg total) by mouth daily.   Blood Glucose Monitoring Suppl (BLOOD GLUCOSE MONITOR SYSTEM) W/DEVICE KIT by Does not apply route. Use to check sugar once daily and as needed Dx: E11.9 **ONE TOUCH VERIO**   carvedilol (COREG) 6.25 MG tablet TAKE 1 TABLET BY MOUTH 2 TIMES DAILY WITH A MEAL.   Cholecalciferol (VITAMIN D) 50 MCG (2000 UT) CAPS Take 1 capsule (2,000 Units total) by mouth daily. (Patient taking differently: Take 2,000 Units by mouth daily.)   citalopram (CELEXA) 10 MG tablet Take 1 tablet (10 mg total) by mouth daily.   Docusate Calcium (STOOL SOFTENER PO) Take 1 tablet by mouth daily.   fluticasone (FLONASE) 50 MCG/ACT nasal spray SPRAY 2 SPRAYS INTO EACH NOSTRIL EVERY DAY (Patient taking differently: Place 1 spray into both nostrils daily as needed for allergies.)   fluticasone-salmeterol (WIXELA INHUB) 100-50 MCG/ACT AEPB Inhale 1 puff into the lungs 2 (two) times daily.   furosemide (LASIX) 80 MG tablet Take 1 tablet (80 mg total) by mouth 2 (two) times daily.   glimepiride (  AMARYL) 4 MG tablet TAKE 1 TABLET BY MOUTH EVERY DAY WITH BREAKFAST   hydrOXYzine (ATARAX) 50 MG tablet TAKE 1 TABLET (50 MG TOTAL) BY MOUTH 2 (TWO) TIMES DAILY AS NEEDED FOR ANXIETY.   ipratropium (ATROVENT HFA) 17 MCG/ACT inhaler Inhale 2 puffs into the lungs every 6 (six) hours as needed for wheezing.   metFORMIN (GLUCOPHAGE) 1000 MG tablet TAKE 1 TABLET (1,000 MG TOTAL) BY MOUTH 2 (TWO) TIMES DAILY WITH A MEAL.   metolazone (ZAROXOLYN) 2.5 MG tablet Take 1  tablet (2.5 mg total) by mouth 2 (two) times a week.   montelukast (SINGULAIR) 10 MG tablet TAKE 1 TABLET BY MOUTH EVERYDAY AT BEDTIME   NON FORMULARY daily. SUPER BEETS   NON FORMULARY 500 mg once. TURMERIC   ONETOUCH VERIO test strip USE AS INSTRUCTED TO CHECK THREE TIMES DAILY AND AS NEEDED. E11.8   pantoprazole (PROTONIX) 40 MG tablet TAKE 1 TABLET (40 MG TOTAL) BY MOUTH 2 (TWO) TIMES DAILY BEFORE A MEAL.   Probiotic Product (PROBIOTIC DAILY PO) Take 1 tablet by mouth daily.   simvastatin (ZOCOR) 20 MG tablet TAKE 1 TABLET BY MOUTH EVERY DAY IN THE EVENING   triamcinolone cream (KENALOG) 0.1 % Apply 1 application. topically 2 (two) times daily. Apply to AA. (Patient taking differently: Apply 1 application. topically 3 (three) times daily. Apply to AA.)   umeclidinium bromide (INCRUSE ELLIPTA) 62.5 MCG/ACT AEPB Inhale 1 puff into the lungs daily.   vitamin B-12 (CYANOCOBALAMIN) 1000 MCG tablet Take 1 tablet (1,000 mcg total) by mouth every Monday, Wednesday, and Friday. (Patient taking differently: Take 1,000 mcg by mouth daily.)   ferrous sulfate 325 (65 FE) MG tablet Take 1 tablet (325 mg total) by mouth daily with breakfast. (Patient not taking: Reported on 09/24/2021)   losartan (COZAAR) 25 MG tablet TAKE 1 TABLET (25 MG TOTAL) BY MOUTH DAILY. (Patient not taking: Reported on 09/24/2021)   NON FORMULARY 1,200 mg daily. SEA MOSS (Patient not taking: Reported on 09/24/2021)   NON FORMULARY 2,000 mg daily. CINAMMON (Patient not taking: Reported on 09/24/2021)   No facility-administered encounter medications on file as of 09/24/2021.    Allergies (verified) Atorvastatin and Lisinopril   History: Past Medical History:  Diagnosis Date   Abnormal drug screen 2015   MJ positive x3 - one more and we will stop prescribing ativan (08/2013).   Angina    Anxiety    Arthritis    CAD (coronary artery disease)    nonobstructive   Cannabis abuse    Chronic systolic CHF (congestive heart failure) ([place])  2013   NYHA Class II/III   COPD (chronic obstructive pulmonary disease) ([place])    Diabetes type 2, uncontrolled    declines DSME   Dilated cardiomyopathy ([place]) 2013   now improved   Ex-smoker    Frequent headaches    History of atrial fibrillation 2013   chronic, s/p ablation prior on coumadin   Hyperlipidemia    Migraine    Nonischemic cardiomyopathy ([place]) 2013   EF 25% per Dr Nehemiah Massed   Obesity    OSA (obstructive sleep apnea)    does not use CPAP - unable to tolerate   Seasonal allergies    Past Surgical History:  Procedure Laterality Date   ATRIAL FLUTTER ABLATION N/A 09/21/2011   Procedure: ATRIAL FLUTTER ABLATION;  Surgeon: Thompson Grayer, MD;  Location: [facility] CATH LAB;  Service: Cardiovascular;  Laterality: N/A;   CARDIAC ELECTROPHYSIOLOGY [place] AND ABLATION  2013   for atrial  flutter   CARDIOVASCULAR STRESS TEST  03/2012   ETT WNL Nehemiah Massed)   CARDIOVERSION N/A 08/12/2021   Procedure: CARDIOVERSION;  Surgeon: Corey Skains, MD;  Location: [facility] ORS;  Service: Cardiovascular;  Laterality: N/A;   COLONOSCOPY WITH PROPOFOL N/A 05/22/2020   TA, diverticulosis, descending colon ulcer biopsy WNL, int hem [facility] Blount Norris, Darren, MD)   ESOPHAGOGASTRODUODENOSCOPY (EGD) WITH PROPOFOL N/A 05/22/2020   WNL ([place])   US ECHOCARDIOGRAPHY  2014   EF 50%, nl LV fxn, RV nl size/function, mild mitral insuff   Family History  Problem Relation Age of Onset   Heart failure Father 61   Cancer Mother 8       NHL   Hypertension Maternal Grandfather    CAD Maternal Grandfather 69       MI   Diabetes Other        grandparents   Cancer Brother 70       lung (smoker)   Social History   Socioeconomic History   Marital status: Single    Spouse name: Not on file   Number of children: 0   Years of education: Not on file   Highest education level: Not on file  Occupational History   Occupation: disable   Tobacco Use   Smoking status: Former    Packs/day: 1.00    Years: 31.00    Pack years:  31.00    Types: Cigarettes    Quit date: 04/26/2018    Years since quitting: 3.4   Smokeless tobacco: Never  Vaping Use   Vaping Use: Never used  Substance and Sexual Activity   Alcohol use: No    Alcohol/week: 0.0 standard drinks   Drug use: Yes    Types: Marijuana    Comment: 05/18/20   Sexual activity: Yes  Other Topics Concern   Not on file  Social History Narrative   Pt lives in [place]   Divorced   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   Disability due to neck arthritis, anxiety and sleep apnea   Edu: HS   Activity: no regular exercise, mows lawn (riding and push)   Diet: good water, fruits/vegetables daily   Social Determinants of Health   Financial Resource Strain: Low Risk    Difficulty of Paying Living Expenses: Not hard at all  Food Insecurity: No Food Insecurity   Worried About Charity fundraiser in the Last Year: Never true   [place] in the Last Year: Never true  Transportation Needs: No Transportation Needs   Lack of Transportation (Medical): No   Lack of Transportation (Non-Medical): No  Physical Activity: Inactive   Days of Exercise per Week: 0 days   Minutes of Exercise per Session: 0 min  Stress: No Stress Concern Present   Feeling of Stress : Not at all  Social Connections: Not on file    Tobacco Counseling Counseling given: Not Answered   Clinical Intake:  Pre-visit preparation completed: Yes  Pain : No/denies pain     Nutritional Status: BMI > 30  Obese Nutritional Risks: None Diabetes: Yes  How often do you need to have someone help you when you read instructions, pamphlets, or other written materials from your doctor or pharmacy?: 1 - Never What is the last grade level you completed in school?: 12th grade  Diabetic? Yes Nutrition Risk Assessment:  Has the patient had any N/V/D within the last 2 months?  No  Does the patient have any non-healing wounds?  No  Has the patient  had any unintentional weight loss or weight gain?   Yes   Diabetes:  Is the patient diabetic?  Yes  If diabetic, was a CBG obtained today?  No  Did the patient bring in their glucometer from home?  No  How often do you monitor your CBG's? daily.   Financial Strains and Diabetes Management:  Are you having any financial strains with the device, your supplies or your medication? No .  Does the patient want to be seen by Chronic Care Management for management of their diabetes?  No  Would the patient like to be referred to a Nutritionist or for Diabetic Management?  No   Diabetic Exams:  Diabetic Eye Exam: Overdue for diabetic eye exam. Pt has been advised about the importance in completing this exam. Patient advised to call and schedule an eye exam. Diabetic Foot Exam: Overdue, Pt has been advised about the importance in completing this exam. Pt is scheduled for diabetic foot exam on next appointment.   Interpreter Needed?: No  Information entered by :: NAllen LPN   Activities of Daily Living    09/24/2021    2:09 PM 08/12/2021    7:02 AM  In your present state of health, do you have any difficulty performing the following activities:  Hearing? 0 0  Vision? 1 0  Difficulty concentrating or making decisions? 0 0  Walking or climbing stairs? 1 0  Dressing or bathing? 0 0  Doing errands, shopping? 0   Preparing Food and eating ? N   Using the Toilet? N   In the past six months, have you accidently leaked urine? Y   Do you have problems with loss of bowel control? N   Managing your Medications? N   Managing your Finances? N   Housekeeping or managing your Housekeeping? N     Patient Care Team: [name], MD as PCP - General (Family Medicine) Vickie Epley, MD as PCP - Electrophysiology (Cardiology) Thompson Grayer, MD as Attending Physician (Cardiology) Corey Skains, MD as Referring Physician (Internal Medicine) Debbora Dus, [facility] as Pharmacist (Pharmacist)  Indicate any recent Medical Services you may  have received from other than [facility] providers in the past year (date may be approximate).     Assessment:   This is a routine wellness examination for [name].  Hearing/Vision screen Vision Screening - Comments:: No regular eye exams,  Dietary issues and exercise activities discussed: Current Exercise Habits: The patient does not participate in regular exercise at present   Goals Addressed             This Visit's Progress    Patient Stated       09/24/2021, survive       Depression Screen    09/24/2021    2:08 PM 09/08/2021   10:45 AM 07/20/2021    4:50 PM 07/09/2020    2:28 PM 01/05/2018   11:03 AM 10/17/2017   10:45 AM 10/04/2016    8:25 AM  PHQ 2/9 Scores  PHQ - 2 Score 0 3 3 0 0 0 0  PHQ- 9 Score  9 14 0       Fall Risk    09/24/2021    2:08 PM 07/20/2021    3:29 PM 07/09/2020    2:28 PM 12/15/2018    9:27 AM 12/15/2018    9:14 AM  Fall Risk   Falls in the past year? 1 1 0 0 0  Comment loses balance  Number falls in past yr: 1 1 0    Injury with Fall? 0 0 0    Risk for fall due to : Impaired balance/gait;Impaired mobility;Medication side effect  Medication side effect    Follow up Falls evaluation completed;Education provided;Falls prevention discussed  Falls evaluation completed;Falls prevention discussed      FALL RISK PREVENTION PERTAINING TO THE HOME:  Any stairs in or around the home? No  If so, are there any without handrails?  N/a Home free of loose throw rugs in walkways, pet beds, electrical cords, etc? Yes  Adequate lighting in your home to reduce risk of falls? Yes   ASSISTIVE DEVICES UTILIZED TO PREVENT FALLS:  Life alert? No  Use of a cane, walker or w/c? Yes  Grab bars in the bathroom? Yes  Shower chair or bench in shower? No  Elevated toilet seat or a handicapped toilet? No   TIMED UP AND GO:  Was the test performed? No .      Cognitive Function:    07/09/2020    2:31 PM 10/04/2016    8:26 AM  MMSE - Mini Mental State Exam   Orientation to time 5 5  Orientation to Place 5 5  Registration 3 3  Attention/ Calculation 5 0  Recall 3 3  Language- name 2 objects  0  Language- repeat 1 1  Language- follow 3 step command  3  Language- read & follow direction  0  Write a sentence  0  Copy design  0  Total score  20        09/24/2021    2:11 PM  6CIT Screen  What Year? 0 points  What month? 0 points  What time? 0 points  Count back from 20 0 points  Months in reverse 4 points  Repeat phrase 0 points  Total Score 4 points    Immunizations Immunization History  Administered Date(s) Administered   Influenza, High Dose Seasonal PF 03/26/2021   Influenza,inj,Quad PF,6+ Mos 05/29/2014, 02/21/2015, 02/22/2017, 03/16/2018, 01/02/2019, 01/04/2020   Influenza-Unspecified 01/24/2013   PFIZER(Purple Top)SARS-COV-2 Vaccination 07/12/2019, 08/02/2019, 03/19/2020   Pfizer Covid-19 Vaccine Bivalent Booster 47yr & up 03/26/2021   Pneumococcal Polysaccharide-23 02/24/2013   Td 04/26/2006    TDAP status: Due, Education has been provided regarding the importance of this vaccine. Advised may receive this vaccine at local pharmacy or Health Dept. Aware to provide a copy of the vaccination record if obtained from local pharmacy or Health Dept. Verbalized acceptance and understanding.  Flu Vaccine status: Up to date  Pneumococcal vaccine status: Due, Education has been provided regarding the importance of this vaccine. Advised may receive this vaccine at local pharmacy or Health Dept. Aware to provide a copy of the vaccination record if obtained from local pharmacy or Health Dept. Verbalized acceptance and understanding.  Covid-19 vaccine status: Completed vaccines  Qualifies for Shingles Vaccine? Yes   Zostavax completed No   Shingrix Completed?: No.    Education has been provided regarding the importance of this vaccine. Patient has been advised to call insurance company to determine out of pocket expense if they have  not yet received this vaccine. Advised may also receive vaccine at local pharmacy or Health Dept. Verbalized acceptance and understanding.  Screening Tests Health Maintenance  Topic Date Due   Zoster Vaccines- Shingrix (1 of 2) Never done   Pneumonia Vaccine 66 Years old (2 - PCV) 02/24/2014   FOOT EXAM  06/04/2017   OPHTHALMOLOGY EXAM  06/17/2021  TETANUS/TDAP  04/25/2026 (Originally 04/26/2016)   INFLUENZA VACCINE  11/24/2021   HEMOGLOBIN A1C  01/15/2022   COLONOSCOPY (Pts 45-32yr Insurance coverage will need to be confirmed)  05/23/2027   COVID-19 Vaccine  Completed   Hepatitis C Screening  Completed   HPV VACCINES  Aged Out    Health Maintenance  Health Maintenance Due  Topic Date Due   Zoster Vaccines- Shingrix (1 of 2) Never done   Pneumonia Vaccine 66 Years old (2 - PCV) 02/24/2014   FOOT EXAM  06/04/2017   OPHTHALMOLOGY EXAM  06/17/2021    Colorectal cancer screening: Type of screening: Colonoscopy. Completed 05/22/2020. Repeat every 3 years  Lung Cancer Screening: (Low Dose CT Chest recommended if Age 66-80years, 30 pack-year currently smoking OR have quit w/in 15years.) does not qualify.   Lung Cancer Screening Referral: no  Additional Screening:  Hepatitis C Screening: does qualify; Completed 10/04/2016  Vision Screening: Recommended annual ophthalmology exams for early detection of glaucoma and other disorders of the eye. Is the patient up to date with their annual eye exam?  No  Who is the provider or what is the name of the office in which the patient attends annual eye exams? none If pt is not established with a provider, would they like to be referred to a provider to establish care? No .   Dental Screening: Recommended annual dental exams for proper oral hygiene  Community Resource Referral / Chronic Care Management: CRR required this visit?  No   CCM required this visit?  No      Plan:     I have personally reviewed and noted the following in  the patient's chart:   Medical and social history Use of alcohol, tobacco or illicit drugs  Current medications and supplements including opioid prescriptions. Patient is not currently taking opioid prescriptions. Functional ability and status Nutritional status Physical activity Advanced directives List of other physicians Hospitalizations, surgeries, and ER visits in previous 12 months Vitals Screenings to include cognitive, depression, and falls Referrals and appointments  In addition, I have reviewed and discussed with patient certain preventive protocols, quality metrics, and best practice recommendations. A written personalized care plan for preventive services as well as general preventive health recommendations were provided to patient.     NKellie Simmering LPN   [id number]  Nurse Notes: none  Due to this being a virtual visit, the after visit summary with patients personalized plan was offered to patient via mail or my-chart.  Patient preferred to pick up at office at next visit

## 2021-09-24 NOTE — Patient Instructions (Signed)
[name] , Thank you for taking time to come for your Medicare Wellness Visit. I appreciate your ongoing commitment to your health goals. Please review the following plan we discussed and let me know if I can assist you in the future.   Screening recommendations/referrals: Colonoscopy: completed 05/22/2020, due 05/23/2023 Recommended yearly ophthalmology/optometry visit for glaucoma screening and checkup Recommended yearly dental visit for hygiene and checkup  Vaccinations: Influenza vaccine: due 11/24/2021 Pneumococcal vaccine: due Tdap vaccine: due Shingles vaccine: discussed   Covid-19:  03/26/2021, 03/19/2020, 08/02/2019, 07/12/2019  Advanced directives: Advance directive discussed with you today.   Conditions/risks identified: none  Next appointment: Follow up in one year for your annual wellness visit.   Preventive Care 66 Years and Older, Male Preventive care refers to lifestyle choices and visits with your health care provider that can promote health and wellness. What does preventive care include? A yearly physical exam. This is also called an annual well check. Dental exams once or twice a year. Routine eye exams. Ask your health care provider how often you should have your eyes checked. Personal lifestyle choices, including: Daily care of your teeth and gums. Regular physical activity. Eating a healthy diet. Avoiding tobacco and drug use. Limiting alcohol use. Practicing safe sex. Taking low doses of aspirin every day. Taking vitamin and mineral supplements as recommended by your health care provider. What happens during an annual well check? The services and screenings done by your health care provider during your annual well check will depend on your age, overall health, lifestyle risk factors, and family history of disease. Counseling  Your health care provider may ask you questions about your: Alcohol use. Tobacco use. Drug use. Emotional well-being. Home and  relationship well-being. Sexual activity. Eating habits. History of falls. Memory and ability to understand (cognition). Work and work Statistician. Screening  You may have the following tests or measurements: Height, weight, and BMI. Blood pressure. Lipid and cholesterol levels. These may be checked every 5 years, or more frequently if you are over 51 years old. Skin check. Lung cancer screening. You may have this screening every year starting at age 25 if you have a 30-pack-year history of smoking and currently smoke or have quit within the past 15 years. Fecal occult blood test (FOBT) of the stool. You may have this test every year starting at age 10. Flexible sigmoidoscopy or colonoscopy. You may have a sigmoidoscopy every 5 years or a colonoscopy every 10 years starting at age 32. Prostate cancer screening. Recommendations will vary depending on your family history and other risks. Hepatitis C blood test. Hepatitis B blood test. Sexually transmitted disease (STD) testing. Diabetes screening. This is done by checking your blood sugar (glucose) after you have not eaten for a while (fasting). You may have this done every 1-3 years. Abdominal aortic aneurysm (AAA) screening. You may need this if you are a current or former smoker. Osteoporosis. You may be screened starting at age 23 if you are at high risk. Talk with your health care provider about your test results, treatment options, and if necessary, the need for more tests. Vaccines  Your health care provider may recommend certain vaccines, such as: Influenza vaccine. This is recommended every year. Tetanus, diphtheria, and acellular pertussis (Tdap, Td) vaccine. You may need a Td booster every 10 years. Zoster vaccine. You may need this after age 53. Pneumococcal 13-valent conjugate (PCV13) vaccine. One dose is recommended after age 78. Pneumococcal polysaccharide (PPSV23) vaccine. One dose is recommended after  age 2. Talk to your  health care provider about which screenings and vaccines you need and how often you need them. This information is not intended to replace advice given to you by your health care provider. Make sure you discuss any questions you have with your health care provider. Document Released: 05/09/2015 Document Revised: 12/31/2015 Document Reviewed: 02/11/2015 Elsevier Interactive Patient Education  2017 [place] Prevention in the Home Falls can cause injuries. They can happen to people of all ages. There are many things you can do to make your home safe and to help prevent falls. What can I do on the outside of my home? Regularly fix the edges of walkways and driveways and fix any cracks. Remove anything that might make you trip as you walk through a door, such as a raised step or threshold. Trim any bushes or trees on the path to your home. Use bright outdoor lighting. Clear any walking paths of anything that might make someone trip, such as rocks or tools. Regularly check to see if handrails are loose or broken. Make sure that both sides of any steps have handrails. Any raised decks and porches should have guardrails on the edges. Have any leaves, snow, or ice cleared regularly. Use sand or salt on walking paths during winter. Clean up any spills in your garage right away. This includes oil or grease spills. What can I do in the bathroom? Use night lights. Install grab bars by the toilet and in the tub and shower. Do not use towel bars as grab bars. Use non-skid mats or decals in the tub or shower. If you need to sit down in the shower, use a plastic, non-slip stool. Keep the floor dry. Clean up any water that spills on the floor as soon as it happens. Remove soap buildup in the tub or shower regularly. Attach bath mats securely with double-sided non-slip rug tape. Do not have throw rugs and other things on the floor that can make you trip. What can I do in the bedroom? Use night  lights. Make sure that you have a light by your bed that is easy to reach. Do not use any sheets or blankets that are too big for your bed. They should not hang down onto the floor. Have a firm chair that has side arms. You can use this for support while you get dressed. Do not have throw rugs and other things on the floor that can make you trip. What can I do in the kitchen? Clean up any spills right away. Avoid walking on wet floors. Keep items that you use a lot in easy-to-reach places. If you need to reach something above you, use a strong step stool that has a grab bar. Keep electrical cords out of the way. Do not use floor polish or wax that makes floors slippery. If you must use wax, use non-skid floor wax. Do not have throw rugs and other things on the floor that can make you trip. What can I do with my stairs? Do not leave any items on the stairs. Make sure that there are handrails on both sides of the stairs and use them. Fix handrails that are broken or loose. Make sure that handrails are as long as the stairways. Check any carpeting to make sure that it is firmly attached to the stairs. Fix any carpet that is loose or worn. Avoid having throw rugs at the top or bottom of the stairs. If you do  have throw rugs, attach them to the floor with carpet tape. Make sure that you have a light switch at the top of the stairs and the bottom of the stairs. If you do not have them, ask someone to add them for you. What else can I do to help prevent falls? Wear shoes that: Do not have high heels. Have rubber bottoms. Are comfortable and fit you well. Are closed at the toe. Do not wear sandals. If you use a stepladder: Make sure that it is fully opened. Do not climb a closed stepladder. Make sure that both sides of the stepladder are locked into place. Ask someone to hold it for you, if possible. Clearly mark and make sure that you can see: Any grab bars or handrails. First and last  steps. Where the edge of each step is. Use tools that help you move around (mobility aids) if they are needed. These include: Canes. Walkers. Scooters. Crutches. Turn on the lights when you go into a dark area. Replace any light bulbs as soon as they burn out. Set up your furniture so you have a clear path. Avoid moving your furniture around. If any of your floors are uneven, fix them. If there are any pets around you, be aware of where they are. Review your medicines with your doctor. Some medicines can make you feel dizzy. This can increase your chance of falling. Ask your doctor what other things that you can do to help prevent falls. This information is not intended to replace advice given to you by your health care provider. Make sure you discuss any questions you have with your health care provider. Document Released: 02/06/2009 Document Revised: 09/18/2015 Document Reviewed: 05/17/2014 Elsevier Interactive Patient Education  2017 [employer].

## 2021-09-24 NOTE — Telephone Encounter (Signed)
Pt lmovm stating that he did not miss the appt and did not know why he needed this procedure

## 2021-09-24 NOTE — Telephone Encounter (Signed)
Tried to contact pt but no answer, LVM. Tried to contact pt's girlfriend on that number but call said it cannot be completed at this time, unable to LVM.

## 2021-09-24 NOTE — Progress Notes (Signed)
JAMAR, WEATHERALL ([id number]) Visit Report for 09/23/2021 Arrival Information Details Patient Name: [name], [name]. Date of Service: 09/23/2021 3:15 PM Medical Record Number: [id number] Patient Account Number: [id number] Date of Birth/Sex: [birth year] (66 y.o. M) Treating RN: Cornell Barman Primary Care Shammond Arave: [name] Other Clinician: Referring Maximiliano Cromartie: [name] Treating Vernel Langenderfer/Extender: Yaakov Guthrie in Treatment: 3 Visit Information History Since Last Visit Added or deleted any medications: No Patient Arrived: Ambulatory Has Dressing in Place as Prescribed: No Arrival Time: 15:20 Has Compression in Place as Prescribed: No Transfer Assistance: None Pain Present Now: Yes Patient Identification Verified: Yes Secondary Verification Process Completed: Yes Patient Requires Transmission-Based Precautions: No Patient Has Alerts: Yes Patient Alerts: DIABETIC Electronic Signature(s) Signed: 09/24/2021 9:49:48 AM By: Gretta Cool, BSN, RN, CWS, Kim RN, BSN Entered By: Gretta Cool, BSN, RN, CWS, Kim on 09/23/2021 15:24:55 Credeur, [name] ([id number]) -------------------------------------------------------------------------------- Compression Therapy Details Patient Name: [name]. Date of Service: 09/23/2021 3:15 PM Medical Record Number: [id number] Patient Account Number: [id number] Date of Birth/Sex: August 17, 1955 (66 y.o. M) Treating RN: Cornell Barman Primary Care Shan Valdes: [name] Other Clinician: Referring Martyna Thorns: [name] Treating Reagyn Facemire/Extender: Yaakov Guthrie in Treatment: 3 Compression Therapy Performed for Wound Assessment: Wound #1 Right,Circumferential Lower Leg Performed By: Clinician Cornell Barman, RN Compression Type: Three Layer Pre Treatment ABI: 1.3 Post Procedure Diagnosis Same as Pre-procedure Electronic Signature(s) Signed: 09/24/2021 9:49:48 AM By: Gretta Cool, BSN, RN, CWS, Kim RN, BSN Entered By: Gretta Cool, BSN, RN,  CWS, Kim on 09/23/2021 15:47:46 Kapral, [name] ([id number]) -------------------------------------------------------------------------------- Encounter Discharge Information Details Patient Name: [name]. Date of Service: 09/23/2021 3:15 PM Medical Record Number: [id number] Patient Account Number: [id number] Date of Birth/Sex: 11-Jan-1956 (66 y.o. M) Treating RN: Cornell Barman Primary Care Shilo Pauwels: [name] Other Clinician: Referring Maxden Naji: [name] Treating Jnae Thomaston/Extender: Yaakov Guthrie in Treatment: 3 Encounter Discharge Information Items Post Procedure Vitals Discharge Condition: Stable Temperature (F): 98.7 Ambulatory Status: Ambulatory Pulse (bpm): 78 Discharge Destination: Home Respiratory Rate (breaths/min): 16 Transportation: Private Auto Blood Pressure (mmHg): 138/79 Accompanied By: self Schedule Follow-up Appointment: Yes Clinical Summary of Care: Electronic Signature(s) Signed: 09/23/2021 4:22:49 PM By: Gretta Cool, BSN, RN, CWS, Kim RN, BSN Entered By: Gretta Cool, BSN, RN, CWS, Kim on 09/23/2021 16:22:49 Thursby, [name] ([id number]) -------------------------------------------------------------------------------- Lower Extremity Assessment Details Patient Name: [name]. Date of Service: 09/23/2021 3:15 PM Medical Record Number: [id number] Patient Account Number: [id number] Date of Birth/Sex: 1955-10-13 (66 y.o. M) Treating RN: Cornell Barman Primary Care Shandale Malak: [name] Other Clinician: Referring Helmi Hechavarria: [name] Treating Dehlia Kilner/Extender: Yaakov Guthrie in Treatment: 3 Edema Assessment Assessed: [Left: No] [Right: Yes] Edema: [Left: Ye] [Right: s] Calf Left: Right: Point of Measurement: 36 cm From Medial Instep 47 cm Ankle Left: Right: Point of Measurement: 12 cm From Medial Instep 29 cm Vascular Assessment Pulses: Dorsalis Pedis Palpable: [Right:Yes] Electronic Signature(s) Signed:  09/24/2021 9:49:48 AM By: Gretta Cool, BSN, RN, CWS, Kim RN, BSN Entered By: Gretta Cool, BSN, RN, CWS, Kim on 09/23/2021 15:30:10 Scherzer, [name] ([id number]) -------------------------------------------------------------------------------- Multi Wound Chart Details Patient Name: [name]. Date of Service: 09/23/2021 3:15 PM Medical Record Number: [id number] Patient Account Number: [id number] Date of Birth/Sex: [birth year] (66 y.o. M) Treating RN: Cornell Barman Primary Care Sritha Chauncey: [name] Other Clinician: Referring Tiyonna Sardinha: [name] Treating Parris Signer/Extender: Yaakov Guthrie in Treatment: 3 Vital Signs Height(in): 71 Pulse(bpm): 70 Weight(lbs): 295 Blood Pressure(mmHg): 150/85 Body Mass Index(BMI): 41.1 Temperature(F): 98.0 Respiratory Rate(breaths/min): 18 Photos: [N/A:N/A] Wound Location: Right, Circumferential Lower Leg N/A N/A Wounding  Event: Gradually Appeared N/A N/A Primary Etiology: Lymphedema N/A N/A Comorbid History: Chronic Obstructive Pulmonary N/A N/A Disease (COPD), Arrhythmia, Congestive Heart Failure, Type II Diabetes Date Acquired: 08/03/2021 N/A N/A Weeks of Treatment: 3 N/A N/A Wound Status: Open N/A N/A Wound Recurrence: No N/A N/A Measurements L x W x D (cm) 10x4x0.1 N/A N/A Area (cm) : 31.416 N/A N/A Volume (cm) : 3.142 N/A N/A % Reduction in Area: 92.10% N/A N/A % Reduction in Volume: 92.10% N/A N/A Classification: Full Thickness Without Exposed N/A N/A Support Structures Exudate Amount: Medium N/A N/A Exudate Type: Serosanguineous N/A N/A Exudate Color: red, brown N/A N/A Granulation Amount: Medium (34-66%) N/A N/A Granulation Quality: Red N/A N/A Necrotic Amount: Medium (34-66%) N/A N/A Exposed Structures: Fat Layer (Subcutaneous Tissue): N/A N/A Yes Fascia: No Tendon: No Muscle: No Joint: No Bone: No Epithelialization: None N/A N/A Treatment Notes Electronic Signature(s) Signed: 09/24/2021 9:49:48 AM By:  Gretta Cool, BSN, RN, CWS, Kim RN, BSN Entered By: Gretta Cool, BSN, RN, CWS, Kim on 09/23/2021 15:46:10 Pozzi, [name] ([id number]) -------------------------------------------------------------------------------- [place] Details Patient Name: [name]. Date of Service: 09/23/2021 3:15 PM Medical Record Number: [id number] Patient Account Number: [id number] Date of Birth/Sex: Apr 08, 1956 (66 y.o. M) Treating RN: Cornell Barman Primary Care Levie Owensby: [name] Other Clinician: Referring Oriana Horiuchi: [name] Treating Feliciano Wynter/Extender: Yaakov Guthrie in Treatment: 3 Active Inactive Abuse / Safety / Falls / Self Care Management Nursing Diagnoses: Potential for falls Goals: Patient will remain injury free related to falls Date Initiated: 09/02/2021 Target Resolution Date: 10/03/2021 Goal Status: Active Interventions: Assess Activities of Daily Living upon admission and as needed Assess fall risk on admission and as needed Assess: immobility, friction, shearing, incontinence upon admission and as needed Assess impairment of mobility on admission and as needed per policy Assess personal safety and home safety (as indicated) on admission and as needed Assess self care needs on admission and as needed Notes: Necrotic Tissue Nursing Diagnoses: Impaired tissue integrity related to necrotic/devitalized tissue Knowledge deficit related to management of necrotic/devitalized tissue Goals: Necrotic/devitalized tissue will be minimized in the wound bed Date Initiated: 09/23/2021 Target Resolution Date: 09/23/2021 Goal Status: Active Patient/caregiver will verbalize understanding of reason and process for debridement of necrotic tissue Date Initiated: 09/23/2021 Target Resolution Date: 09/23/2021 Goal Status: Active Interventions: Assess patient pain level pre-, during and post procedure and prior to discharge Provide education on necrotic tissue and  debridement process Treatment Activities: Excisional debridement : 09/23/2021 Notes: Nutrition Nursing Diagnoses: Impaired glucose control: actual or potential Goals: Patient/caregiver will maintain therapeutic glucose control Date Initiated: 09/02/2021 Target Resolution Date: 10/03/2021 Goal Status: Active Interventions: [name], [name] ([id number]) Assess HgA1c results as ordered upon admission and as needed Assess patient nutrition upon admission and as needed per policy Notes: Wound/Skin Impairment Nursing Diagnoses: Knowledge deficit related to ulceration/compromised skin integrity Goals: Patient/caregiver will verbalize understanding of skin care regimen Date Initiated: 09/02/2021 Target Resolution Date: 10/03/2021 Goal Status: Active Ulcer/skin breakdown will have a volume reduction of 30% by week 4 Date Initiated: 09/02/2021 Target Resolution Date: 10/03/2021 Goal Status: Active Ulcer/skin breakdown will have a volume reduction of 50% by week 8 Date Initiated: 09/02/2021 Target Resolution Date: 11/02/2021 Goal Status: Active Ulcer/skin breakdown will have a volume reduction of 80% by week 12 Date Initiated: 09/02/2021 Target Resolution Date: 12/03/2021 Goal Status: Active Ulcer/skin breakdown will heal within 14 weeks Date Initiated: 09/02/2021 Target Resolution Date: 01/03/2022 Goal Status: Active Interventions: Assess patient/caregiver ability to obtain necessary supplies Assess patient/caregiver ability to  perform ulcer/skin care regimen upon admission and as needed Assess ulceration(s) every visit Notes: Electronic Signature(s) Signed: 09/24/2021 9:49:48 AM By: Gretta Cool, BSN, RN, CWS, Kim RN, BSN Entered By: Gretta Cool, BSN, RN, CWS, Kim on 09/23/2021 15:44:01 Hollenback, [name] ([id number]) -------------------------------------------------------------------------------- Pain Assessment Details Patient Name: [name]. Date of Service: 09/23/2021 3:15 PM Medical  Record Number: [id number] Patient Account Number: [id number] Date of Birth/Sex: [birth year] (66 y.o. M) Treating RN: Cornell Barman Primary Care Noelene Gang: [name] Other Clinician: Referring Giannie Soliday: [name] Treating Eulas Schweitzer/Extender: Yaakov Guthrie in Treatment: 3 Active Problems Location of Pain Severity and Description of Pain Patient Has Paino No Site Locations Pain Management and Medication Current Pain Management: Electronic Signature(s) Signed: 09/24/2021 9:49:48 AM By: Gretta Cool, BSN, RN, CWS, Kim RN, BSN Entered By: Gretta Cool, BSN, RN, CWS, Kim on 09/23/2021 15:25:34 Taitt, [name] ([id number]) -------------------------------------------------------------------------------- Patient/Caregiver Education Details Patient Name: [name]. Date of Service: 09/23/2021 3:15 PM Medical Record Number: [id number] Patient Account Number: [id number] Date of Birth/Gender: [birth year] (66 y.o. M) Treating RN: Cornell Barman Primary Care Physician: [name] Other Clinician: Referring Physician: [name] Treating Physician/Extender: Yaakov Guthrie in Treatment: 3 Education Assessment Education Provided To: Patient Education Topics Provided Wound Debridement: Handouts: Wound Debridement Methods: Demonstration, Explain/Verbal Responses: State content correctly Electronic Signature(s) Signed: 09/24/2021 9:49:48 AM By: Gretta Cool, BSN, RN, CWS, Kim RN, BSN Entered By: Gretta Cool, BSN, RN, CWS, Kim on 09/23/2021 15:48:30 Sunde, [name] ([id number]) -------------------------------------------------------------------------------- Wound Assessment Details Patient Name: [name]. Date of Service: 09/23/2021 3:15 PM Medical Record Number: [id number] Patient Account Number: [id number] Date of Birth/Sex: July 09, 1955 (66 y.o. M) Treating RN: Cornell Barman Primary Care Kaijah Abts: [name] Other Clinician: Referring Nyeemah Jennette: [name] Treating Olof Marcil/Extender: Yaakov Guthrie in Treatment: 3 Wound Status Wound Number: 1 Primary Lymphedema Etiology: Wound Location: Right, Circumferential Lower Leg Wound Open Wounding Event: Gradually Appeared Status: Date Acquired: 08/03/2021 Comorbid Chronic Obstructive Pulmonary Disease (COPD), Weeks Of Treatment: 3 History: Arrhythmia, Congestive Heart Failure, Type II Diabetes Clustered Wound: No Photos Wound Measurements Length: (cm) 10 Width: (cm) 4 Depth: (cm) 0.1 Area: (cm) 31.416 Volume: (cm) 3.142 % Reduction in Area: 92.1% % Reduction in Volume: 92.1% Epithelialization: None Tunneling: No Undermining: No Wound Description Classification: Full Thickness Without Exposed Support Structures Exudate Amount: Medium Exudate Type: Serosanguineous Exudate Color: red, brown Foul Odor After Cleansing: No Slough/Fibrino Yes Wound Bed Granulation Amount: Medium (34-66%) Exposed Structure Granulation Quality: Red Fascia Exposed: No Necrotic Amount: Medium (34-66%) Fat Layer (Subcutaneous Tissue) Exposed: Yes Necrotic Quality: Adherent Slough Tendon Exposed: No Muscle Exposed: No Joint Exposed: No Bone Exposed: No Treatment Notes Wound #1 (Lower Leg) Wound Laterality: Right, Circumferential Cleanser Soap and Water Discharge Instruction: Gently cleanse wound with antibacterial soap, rinse and pat dry prior to dressing wounds Peri-Wound Care [name], [name] ([id number]) Topical Gentamicin Discharge Instruction: Apply as directed by Sareen Randon. Mupirocin Ointment Discharge Instruction: Apply as directed by Morrill Bomkamp. Primary Dressing Silvercel 4 1/4x 4 1/4 (in/in) Discharge Instruction: Apply Silvercel 4 1/4x 4 1/4 (in/in) as instructed Secondary Dressing (NON-BORDER) Zetuvit Plus Silicone NON-BORDER 5x5 (in/in) Discharge Instruction: Please do not put silicone bordered dressings under wraps. Use non-bordered dressing only under  wraps. Secured With Compression Wrap 3-LAYER WRAP - Profore Lite LF 3 Multilayer Compression Bandaging System Discharge Instruction: Apply 3 multi-layer wrap as prescribed. Compression Stockings Add-Ons Electronic Signature(s) Signed: 09/24/2021 9:49:48 AM By: Gretta Cool, BSN, RN, CWS, Kim RN, BSN Entered By: Gretta Cool, BSN, RN, CWS, Kim on 09/23/2021 15:28:57 [name], [name] Saxon  P. ([id number]) -------------------------------------------------------------------------------- Vitals Details Patient Name: [name], [name]. Date of Service: 09/23/2021 3:15 PM Medical Record Number: [id number] Patient Account Number: [id number] Date of Birth/Sex: 01-Aug-1955 (66 y.o. M) Treating RN: Cornell Barman Primary Care Katelynne Revak: [name] Other Clinician: Referring Taryn Shellhammer: [name] Treating Kaniel Kiang/Extender: Yaakov Guthrie in Treatment: 3 Vital Signs Time Taken: 15:24 Temperature (F): 98.0 Height (in): 71 Pulse (bpm): 70 Weight (lbs): 295 Respiratory Rate (breaths/min): 18 Body Mass Index (BMI): 41.1 Blood Pressure (mmHg): 150/85 Reference Range: 80 - 120 mg / dl Electronic Signature(s) Signed: 09/24/2021 9:49:48 AM By: Gretta Cool, BSN, RN, CWS, Kim RN, BSN Entered By: Gretta Cool, BSN, RN, CWS, Kim on 09/23/2021 15:25:19

## 2021-09-24 NOTE — Progress Notes (Signed)
SHAIN, PAUWELS ([id number]) Visit Report for 09/23/2021 Chief Complaint Document Details Patient Name: [name], [name]. Date of Service: 09/23/2021 3:15 PM Medical Record Number: [id number] Patient Account Number: [id number] Date of Birth/Sex: Dec 07, 1955 (66 y.o. M) Treating RN: Cornell Barman Primary Care Provider: [name] Other Clinician: Referring Provider: [name] Treating Provider/Extender: Yaakov Guthrie in Treatment: 3 Information Obtained from: Patient Chief Complaint 09/02/2021; right lower extremity wound Electronic Signature(s) Signed: 09/23/2021 4:42:40 PM By: Kalman Shan DO Entered By: Kalman Shan on 09/23/2021 16:38:16 Beamer, [name] ([id number]) -------------------------------------------------------------------------------- Debridement Details Patient Name: [name]. Date of Service: 09/23/2021 3:15 PM Medical Record Number: [id number] Patient Account Number: [id number] Date of Birth/Sex: [birth year] (66 y.o. M) Treating RN: Cornell Barman Primary Care Provider: [name] Other Clinician: Referring Provider: [name] Treating Provider/Extender: Yaakov Guthrie in Treatment: 3 Debridement Performed for Wound #1 Right,Circumferential Lower Leg Assessment: Performed By: Physician Kalman Shan, MD Debridement Type: Debridement Level of Consciousness (Pre- Awake and Alert procedure): Pre-procedure Verification/Time Out Yes - 15:46 Taken: Total Area Debrided (L x W): 8 (cm) x 3 (cm) = 24 (cm) Tissue and other material Viable, Non-Viable, Slough, Subcutaneous, Slough debrided: Level: Skin/Subcutaneous Tissue Debridement Description: Excisional Instrument: Curette Bleeding: Minimum Hemostasis Achieved: Pressure Response to Treatment: Procedure was tolerated well Level of Consciousness (Post- Awake and Alert procedure): Post Debridement Measurements of Total Wound Length: (cm)  10 Width: (cm) 4 Depth: (cm) 0.1 Volume: (cm) 3.142 Character of Wound/Ulcer Post Debridement: Stable Post Procedure Diagnosis Same as Pre-procedure Electronic Signature(s) Signed: 09/23/2021 4:42:40 PM By: Kalman Shan DO Signed: 09/24/2021 9:49:48 AM By: Gretta Cool, BSN, RN, CWS, Kim RN, BSN Entered By: Gretta Cool, BSN, RN, CWS, Kim on 09/23/2021 15:47:18 Len, [name] ([id number]) -------------------------------------------------------------------------------- HPI Details Patient Name: [name]. Date of Service: 09/23/2021 3:15 PM Medical Record Number: [id number] Patient Account Number: [id number] Date of Birth/Sex: [birth year] (66 y.o. M) Treating RN: Cornell Barman Primary Care Provider: [name] Other Clinician: Referring Provider: [name] Treating Provider/Extender: Yaakov Guthrie in Treatment: 3 History of Present Illness HPI Description: Admission 09/02/2021 [name] is a 66 year old male with a past medical history of A-fib, COPD, and OSA that presents the clinic for a 1 month history of nonhealing wounds to the right lower extremity. He states he had increased swelling to his lower extremities despite being on Lasix 40 mg twice daily About 4 weeks ago. He developed blisters and these opened into wounds To both legs. He states that his Lasix was increased to 80 mg twice daily and this helped significantly with his swelling and wound healing. He currently only has wounds to the right lower extremity that are weeping. He has been keeping the areas covered. He currently denies systemic signs of infection. He denies shortness of breath, orthopnea, fever/chills, nausea/vomiting 5/17; patient presents for follow-up. He states that the day after the wrap was placed it slid down and he took it off. He has had no compression on the leg for almost a week. He states he was busy and did not call our office to be rewrapped. Currently denies signs of  infection. 5/24 ;Patient presents for follow-up. He states that the compression wrap came off 2 days after it was placed in office. He states that there was too much drainage and it went through the wrap. He has been keeping the area covered. He did not want to call and come back in to get it really wrapped. He denies signs of infection. 5/31; patient  presents for follow-up. He states that the wrap had stayed in place until about 1 hour before he came in to the clinic because he wanted to take a shower. He reports improvement to wound healing. Electronic Signature(s) Signed: 09/23/2021 4:42:40 PM By: Kalman Shan DO Entered By: Kalman Shan on 09/23/2021 16:39:00 Stys, [name] ([id number]) -------------------------------------------------------------------------------- Physical Exam Details Patient Name: [name]. Date of Service: 09/23/2021 3:15 PM Medical Record Number: [id number] Patient Account Number: [id number] Date of Birth/Sex: [birth year] (66 y.o. M) Treating RN: Cornell Barman Primary Care Provider: [name] Other Clinician: Referring Provider: [name] Treating Provider/Extender: Yaakov Guthrie in Treatment: 3 Constitutional . Cardiovascular . Psychiatric . Notes Right lower extremity: To the posterior aspect there are scattered open wounds with mild weeping and nonviable tissue. Good edema control. Evidence of lymphedema skin changes. No surrounding signs of infection. Electronic Signature(s) Signed: 09/23/2021 4:42:40 PM By: Kalman Shan DO Entered By: Kalman Shan on 09/23/2021 16:39:50 Bohl, [name] ([id number]) -------------------------------------------------------------------------------- Physician Orders Details Patient Name: [name]. Date of Service: 09/23/2021 3:15 PM Medical Record Number: [id number] Patient Account Number: [id number] Date of Birth/Sex: 26-Jul-1955 (66 y.o. M) Treating RN: Cornell Barman Primary Care Provider: [name] Other Clinician: Referring Provider: [name] Treating Provider/Extender: Yaakov Guthrie in Treatment: 3 Verbal / Phone Orders: No Diagnosis Coding Follow-up Appointments o Return Appointment in 1 week. Bathing/ Shower/ Hygiene o May shower with wound dressing protected with water repellent cover or cast protector. Edema Control - Lymphedema / Segmental Compressive Device / Other o Elevate, Exercise Daily and Avoid Standing for Long Periods of Time. o Elevate legs to the level of the heart and pump ankles as often as possible o Elevate leg(s) parallel to the floor when sitting. Wound Treatment Wound #1 - Lower Leg Wound Laterality: Right, Circumferential Cleanser: Soap and Water 1 x Per Week/30 Days Discharge Instructions: Gently cleanse wound with antibacterial soap, rinse and pat dry prior to dressing wounds Topical: Gentamicin 1 x Per Week/30 Days Discharge Instructions: Apply as directed by provider. Topical: Mupirocin Ointment 1 x Per Week/30 Days Discharge Instructions: Apply as directed by provider. Primary Dressing: Silvercel 4 1/4x 4 1/4 (in/in) 1 x Per Week/30 Days Discharge Instructions: Apply Silvercel 4 1/4x 4 1/4 (in/in) as instructed Secondary Dressing: (NON-BORDER) Zetuvit Plus Silicone NON-BORDER 5x5 (in/in) 1 x Per Week/30 Days Discharge Instructions: Please do not put silicone bordered dressings under wraps. Use non-bordered dressing only under wraps. Compression Wrap: 3-LAYER WRAP - Profore Lite LF 3 Multilayer Compression Bandaging System 1 x Per Week/30 Days Discharge Instructions: Apply 3 multi-layer wrap as prescribed. Electronic Signature(s) Signed: 09/23/2021 4:42:40 PM By: Kalman Shan DO Entered By: Kalman Shan on 09/23/2021 16:42:21 Dicarlo, [name] ([id number]) -------------------------------------------------------------------------------- Problem List Details Patient  Name: [name]. Date of Service: 09/23/2021 3:15 PM Medical Record Number: [id number] Patient Account Number: [id number] Date of Birth/Sex: 08/27/1955 (66 y.o. M) Treating RN: Cornell Barman Primary Care Provider: [name] Other Clinician: Referring Provider: [name] Treating Provider/Extender: Yaakov Guthrie in Treatment: 3 Active Problems ICD-10 Encounter Code Description Active Date MDM Diagnosis [phone] Non-pressure chronic ulcer of other part of right lower leg with fat layer 09/02/2021 No Yes exposed I87.321 Chronic venous hypertension (idiopathic) with inflammation of right 09/02/2021 No Yes lower extremity I89.0 Lymphedema, not elsewhere classified 09/02/2021 No Yes E11.622 Type 2 diabetes mellitus with other skin ulcer 09/02/2021 No Yes J44.9 Chronic obstructive pulmonary disease, unspecified 09/02/2021 No Yes I48.91 Unspecified atrial fibrillation 09/02/2021 No  Yes Z79.01 Long term (current) use of anticoagulants 09/02/2021 No Yes Inactive Problems Resolved Problems Electronic Signature(s) Signed: 09/23/2021 4:42:40 PM By: Kalman Shan DO Entered By: Kalman Shan on 09/23/2021 16:38:12 Vandervort, [name] ([id number]) -------------------------------------------------------------------------------- Progress Note Details Patient Name: [name]. Date of Service: 09/23/2021 3:15 PM Medical Record Number: [id number] Patient Account Number: [id number] Date of Birth/Sex: 06-Feb-1956 (66 y.o. M) Treating RN: Cornell Barman Primary Care Provider: [name] Other Clinician: Referring Provider: [name] Treating Provider/Extender: Yaakov Guthrie in Treatment: 3 Subjective Chief Complaint Information obtained from Patient 09/02/2021; right lower extremity wound History of Present Illness (HPI) Admission 09/02/2021 Mr. Patric Buckhalter is a 66 year old male with a past medical history of A-fib, COPD, and OSA that presents  the clinic for a 1 month history of nonhealing wounds to the right lower extremity. He states he had increased swelling to his lower extremities despite being on Lasix 40 mg twice daily About 4 weeks ago. He developed blisters and these opened into wounds To both legs. He states that his Lasix was increased to 80 mg twice daily and this helped significantly with his swelling and wound healing. He currently only has wounds to the right lower extremity that are weeping. He has been keeping the areas covered. He currently denies systemic signs of infection. He denies shortness of breath, orthopnea, fever/chills, nausea/vomiting 5/17; patient presents for follow-up. He states that the day after the wrap was placed it slid down and he took it off. He has had no compression on the leg for almost a week. He states he was busy and did not call our office to be rewrapped. Currently denies signs of infection. 5/24 ;Patient presents for follow-up. He states that the compression wrap came off 2 days after it was placed in office. He states that there was too much drainage and it went through the wrap. He has been keeping the area covered. He did not want to call and come back in to get it really wrapped. He denies signs of infection. 5/31; patient presents for follow-up. He states that the wrap had stayed in place until about 1 hour before he came in to the clinic because he wanted to take a shower. He reports improvement to wound healing. Objective Constitutional Vitals Time Taken: 3:24 PM, Height: 71 in, Weight: 295 lbs, BMI: 41.1, Temperature: 98.0 F, Pulse: 70 bpm, Respiratory Rate: 18 breaths/min, Blood Pressure: 150/85 mmHg. General Notes: Right lower extremity: To the posterior aspect there are scattered open wounds with mild weeping and nonviable tissue. Good edema control. Evidence of lymphedema skin changes. No surrounding signs of infection. Integumentary (Hair, Skin) Wound #1 status is Open.  Original cause of wound was Gradually Appeared. The date acquired was: 08/03/2021. The wound has been in treatment 3 weeks. The wound is located on the Right,Circumferential Lower Leg. The wound measures 10cm length x 4cm width x 0.1cm depth; 31.416cm^2 area and 3.142cm^3 volume. There is Fat Layer (Subcutaneous Tissue) exposed. There is no tunneling or undermining noted. There is a medium amount of serosanguineous drainage noted. There is medium (34-66%) red granulation within the wound bed. There is a medium (34-66%) amount of necrotic tissue within the wound bed including Adherent Slough. Assessment Active Problems ICD-10 Non-pressure chronic ulcer of other part of right lower leg with fat layer exposed Chronic venous hypertension (idiopathic) with inflammation of right lower extremity Lymphedema, not elsewhere classified Type 2 diabetes mellitus with other skin ulcer Chronic obstructive pulmonary disease, unspecified Craigie, Angela P. (  [id number]) Unspecified atrial fibrillation Long term (current) use of anticoagulants Patient's wound has shown improvement in size and appearance since last clinic visit. I debrided nonviable tissue. There is more epithelization occurring. I recommended continuing with gentamicin/mupirocin ointment, silver alginate under 3 layer compression. He benefited greatly from a nurse visit Prior to his weekly follow-up. We will do this again. Procedures Wound #1 Pre-procedure diagnosis of Wound #1 is a Lymphedema located on the Right,Circumferential Lower Leg . There was a Excisional Skin/Subcutaneous Tissue Debridement with a total area of 24 sq cm performed by Kalman Shan, MD. With the following instrument(s): Curette to remove Viable and Non-Viable tissue/material. Material removed includes Subcutaneous Tissue and Slough and. No specimens were taken. A time out was conducted at 15:46, prior to the start of the procedure. A Minimum amount of bleeding was  controlled with Pressure. The procedure was tolerated well. Post Debridement Measurements: 10cm length x 4cm width x 0.1cm depth; 3.142cm^3 volume. Character of Wound/Ulcer Post Debridement is stable. Post procedure Diagnosis Wound #1: Same as Pre-Procedure Pre-procedure diagnosis of Wound #1 is a Lymphedema located on the Right,Circumferential Lower Leg . There was a Three Layer Compression Therapy Procedure with a pre-treatment ABI of 1.3 by Cornell Barman, RN. Post procedure Diagnosis Wound #1: Same as Pre-Procedure Plan Follow-up Appointments: Return Appointment in 1 week. Bathing/ Shower/ Hygiene: May shower with wound dressing protected with water repellent cover or cast protector. Edema Control - Lymphedema / Segmental Compressive Device / Other: Elevate, Exercise Daily and Avoid Standing for Long Periods of Time. Elevate legs to the level of the heart and pump ankles as often as possible Elevate leg(s) parallel to the floor when sitting. WOUND #1: - Lower Leg Wound Laterality: Right, Circumferential Cleanser: Soap and Water 1 x Per Week/30 Days Discharge Instructions: Gently cleanse wound with antibacterial soap, rinse and pat dry prior to dressing wounds Topical: Gentamicin 1 x Per Week/30 Days Discharge Instructions: Apply as directed by provider. Topical: Mupirocin Ointment 1 x Per Week/30 Days Discharge Instructions: Apply as directed by provider. Primary Dressing: Silvercel 4 1/4x 4 1/4 (in/in) 1 x Per Week/30 Days Discharge Instructions: Apply Silvercel 4 1/4x 4 1/4 (in/in) as instructed Secondary Dressing: (NON-BORDER) Zetuvit Plus Silicone NON-BORDER 5x5 (in/in) 1 x Per Week/30 Days Discharge Instructions: Please do not put silicone bordered dressings under wraps. Use non-bordered dressing only under wraps. Compression Wrap: 3-LAYER WRAP - Profore Lite LF 3 Multilayer Compression Bandaging System 1 x Per Week/30 Days Discharge Instructions: Apply 3 multi-layer wrap as  prescribed. 1. In office sharp debridement 2. Gentamicin/mupirocin ointment with silver alginate under 3 layer compression 3. Nurse visit at the end of the week and 1 week follow-up with provider Electronic Signature(s) Signed: 09/23/2021 4:42:40 PM By: Kalman Shan DO Entered By: Kalman Shan on 09/23/2021 16:41:55 Minehart, [name] ([id number]) -------------------------------------------------------------------------------- [place] Details Patient Name: [name]. Date of Service: 09/23/2021 Medical Record Number: [id number] Patient Account Number: [id number] Date of Birth/Sex: December 23, 1955 (66 y.o. M) Treating RN: Cornell Barman Primary Care Provider: [name] Other Clinician: Referring Provider: [name] Treating Provider/Extender: Yaakov Guthrie in Treatment: 3 Diagnosis Coding ICD-10 Codes Code Description [phone] Non-pressure chronic ulcer of other part of right lower leg with fat layer exposed I87.321 Chronic venous hypertension (idiopathic) with inflammation of right lower extremity I89.0 Lymphedema, not elsewhere classified E11.622 Type 2 diabetes mellitus with other skin ulcer J44.9 Chronic obstructive pulmonary disease, unspecified I48.91 Unspecified atrial fibrillation Z79.01 Long term (current) use of anticoagulants Facility Procedures CPT4  Code: [id number] Description: 65465 - DEB SUBQ TISSUE 20 SQ CM/< Modifier: Quantity: 1 CPT4 Code: Description: ICD-10 Diagnosis Description K35.465 Non-pressure chronic ulcer of other part of right lower leg with fat la I87.321 Chronic venous hypertension (idiopathic) with inflammation of right low Modifier: yer exposed er extremity Quantity: CPT4 Code: [id number] Description: 00174 - DEB SUBQ TISS EA ADDL 20CM Modifier: Quantity: 1 CPT4 Code: Description: ICD-10 Diagnosis Description B44.967 Non-pressure chronic ulcer of other part of right lower leg with fat la I87.321 Chronic venous  hypertension (idiopathic) with inflammation of right low Modifier: yer exposed er extremity Quantity: Physician Procedures CPT4 Code: [id number] Description: 11042 - WC PHYS SUBQ TISS 20 SQ CM Modifier: Quantity: 1 CPT4 Code: Description: ICD-10 Diagnosis Description Y65.993 Non-pressure chronic ulcer of other part of right lower leg with fat lay I87.321 Chronic venous hypertension (idiopathic) with inflammation of right lowe Modifier: er exposed r extremity Quantity: CPT4 Code: 5701779 Description: 11045 - WC PHYS SUBQ TISS EA ADDL 20 CM Modifier: Quantity: 1 CPT4 Code: Description: ICD-10 Diagnosis Description T90.300 Non-pressure chronic ulcer of other part of right lower leg with fat lay I87.321 Chronic venous hypertension (idiopathic) with inflammation of right lowe Modifier: er exposed r extremity Quantity: Electronic Signature(s) Signed: 09/23/2021 4:42:40 PM By: Kalman Shan DO Entered By: Kalman Shan on 09/23/2021 16:42:12

## 2021-09-25 ENCOUNTER — Encounter: Payer: Medicare Other | Attending: Physician Assistant

## 2021-09-25 DIAGNOSIS — E11622 Type 2 diabetes mellitus with other skin ulcer: Secondary | ICD-10-CM | POA: Diagnosis not present

## 2021-09-25 DIAGNOSIS — I4891 Unspecified atrial fibrillation: Secondary | ICD-10-CM | POA: Insufficient documentation

## 2021-09-25 DIAGNOSIS — Z7901 Long term (current) use of anticoagulants: Secondary | ICD-10-CM | POA: Insufficient documentation

## 2021-09-25 DIAGNOSIS — J449 Chronic obstructive pulmonary disease, unspecified: Secondary | ICD-10-CM | POA: Diagnosis not present

## 2021-09-25 DIAGNOSIS — L97812 Non-pressure chronic ulcer of other part of right lower leg with fat layer exposed: Secondary | ICD-10-CM | POA: Insufficient documentation

## 2021-09-25 DIAGNOSIS — I890 Lymphedema, not elsewhere classified: Secondary | ICD-10-CM | POA: Diagnosis not present

## 2021-09-25 DIAGNOSIS — I87331 Chronic venous hypertension (idiopathic) with ulcer and inflammation of right lower extremity: Secondary | ICD-10-CM | POA: Insufficient documentation

## 2021-09-25 MED ORDER — WARFARIN SODIUM 5 MG PO TABS: ORAL_TABLET | ORAL | 0 refills | Status: DC

## 2021-09-25 NOTE — Addendum Note (Signed)
Addended by: Randall An on: 09/25/2021 03:53 PM   Modules accepted: Orders

## 2021-09-25 NOTE — Telephone Encounter (Signed)
LVM for pt again.  Contacted pt's friend, Alver Fisher, who reports pt's phone is not always operating properly and sometimes he cannot hear it ring. She has been trying to contact him also with no success. She will try again to contact him and if unable she will send someone over to his home with the message to contact the coumadin clinic.

## 2021-09-25 NOTE — Progress Notes (Signed)
AUDIE, STAYER ([id number]) Visit Report for 09/25/2021 Arrival Information Details Patient Name: [name], [name]. Date of Service: 09/25/2021 2:15 PM Medical Record Number: [id number] Patient Account Number: [id number] Date of Birth/Sex: December 16, 1955 (66 y.o. M) Treating RN: Levora Dredge Primary Care Sione Baumgarten: [name] Other Clinician: Referring Shyah Cadmus: [name] Treating Dayla Gasca/Extender: Skipper Cliche in Treatment: 3 Visit Information History Since Last Visit Added or deleted any medications: No Patient Arrived: Kasandra Knudsen Any new allergies or adverse reactions: No Arrival Time: 14:17 Had a fall or experienced change in No Accompanied By: self activities of daily living that may affect Transfer Assistance: None risk of falls: Patient Identification Verified: Yes Hospitalized since last visit: No Secondary Verification Process Completed: Yes Has Dressing in Place as Prescribed: No Patient Requires Transmission-Based Precautions: No Has Compression in Place as Prescribed: No Patient Has Alerts: Yes Pain Present Now: No Patient Alerts: DIABETIC Electronic Signature(s) Signed: 09/25/2021 4:40:28 PM By: Levora Dredge Entered By: Levora Dredge on 09/25/2021 14:17:49 Norcia, [name] ([id number]) -------------------------------------------------------------------------------- Clinic Level of Care Assessment Details Patient Name: [name]. Date of Service: 09/25/2021 2:15 PM Medical Record Number: [id number] Patient Account Number: [id number] Date of Birth/Sex: Apr 23, 1956 (66 y.o. M) Treating RN: Levora Dredge Primary Care Jacquelina Hewins: [name] Other Clinician: Referring Gracielynn Birkel: [name] Treating Neomia Herbel/Extender: Skipper Cliche in Treatment: 3 Clinic Level of Care Assessment Items TOOL 1 Quantity Score '[]'$  - Use when EandM and Procedure is performed on INITIAL visit 0 ASSESSMENTS - Nursing Assessment / Reassessment '[]'$  -  General Physical Exam (combine w/ comprehensive assessment (listed just below) when performed on new 0 pt. evals) '[]'$  - 0 Comprehensive Assessment (HX, ROS, Risk Assessments, Wounds Hx, etc.) ASSESSMENTS - Wound and Skin Assessment / Reassessment '[]'$  - Dermatologic / Skin Assessment (not related to wound area) 0 ASSESSMENTS - Ostomy and/or Continence Assessment and Care '[]'$  - Incontinence Assessment and Management 0 '[]'$  - 0 Ostomy Care Assessment and Management (repouching, etc.) PROCESS - Coordination of Care '[]'$  - Simple Patient / Family Education for ongoing care 0 '[]'$  - 0 Complex (extensive) Patient / Family Education for ongoing care '[]'$  - 0 Staff obtains Programmer, systems, Records, Test Results / Process Orders '[]'$  - 0 Staff telephones HHA, Nursing Homes / Clarify orders / etc '[]'$  - 0 Routine Transfer to another Facility (non-emergent condition) '[]'$  - 0 Routine Hospital Admission (non-emergent condition) '[]'$  - 0 New Admissions / Biomedical engineer / Ordering NPWT, Apligraf, etc. '[]'$  - 0 Emergency Hospital Admission (emergent condition) PROCESS - Special Needs '[]'$  - Pediatric / Minor Patient Management 0 '[]'$  - 0 Isolation Patient Management '[]'$  - 0 Hearing / Language / Visual special needs '[]'$  - 0 Assessment of Community assistance (transportation, D/C planning, etc.) '[]'$  - 0 Additional assistance / Altered mentation '[]'$  - 0 Support Surface(s) Assessment (bed, cushion, seat, etc.) INTERVENTIONS - Miscellaneous '[]'$  - External ear exam 0 '[]'$  - 0 Patient Transfer (multiple staff / Civil Service fast streamer / Similar devices) '[]'$  - 0 Simple Staple / Suture removal (25 or less) '[]'$  - 0 Complex Staple / Suture removal (26 or more) '[]'$  - 0 Hypo/Hyperglycemic Management (do not check if billed separately) '[]'$  - 0 Ankle / Brachial Index (ABI) - do not check if billed separately Has the patient been seen at the hospital within the last three years: Yes Total Score: 0 Level Of Care: ____ [name]  ([id number]) Electronic Signature(s) Signed: 09/25/2021 4:40:28 PM By: Levora Dredge Entered By: Levora Dredge on 09/25/2021 14:32:13 Sadiq, [name] ([id number]) --------------------------------------------------------------------------------  Compression Therapy Details Patient Name: PASCHAL, BLANTON. Date of Service: 09/25/2021 2:15 PM Medical Record Number: [id number] Patient Account Number: [id number] Date of Birth/Sex: [birth year] (66 y.o. M) Treating RN: Levora Dredge Primary Care Sequoia Mincey: [name] Other Clinician: Referring Jahad Old: [name] Treating Ellianah Cordy/Extender: Skipper Cliche in Treatment: 3 Compression Therapy Performed for Wound Assessment: Wound #1 Right,Circumferential Lower Leg Performed By: Clinician Levora Dredge, RN Compression Type: Three Layer Electronic Signature(s) Signed: 09/25/2021 4:40:28 PM By: Levora Dredge Entered By: Levora Dredge on 09/25/2021 14:21:27 Bethel, [name] ([id number]) -------------------------------------------------------------------------------- Encounter Discharge Information Details Patient Name: [name]. Date of Service: 09/25/2021 2:15 PM Medical Record Number: [id number] Patient Account Number: [id number] Date of Birth/Sex: 06/14/1955 (66 y.o. M) Treating RN: Levora Dredge Primary Care Vivi Piccirilli: [name] Other Clinician: Referring Luberta Grabinski: [name] Treating Topaz Raglin/Extender: Skipper Cliche in Treatment: 3 Encounter Discharge Information Items Discharge Condition: Stable Ambulatory Status: Cane Discharge Destination: Home Transportation: Private Auto Accompanied By: self Schedule Follow-up Appointment: Yes Clinical Summary of Care: Electronic Signature(s) Signed: 09/25/2021 4:40:28 PM By: Levora Dredge Entered By: Levora Dredge on 09/25/2021 14:32:08 Reiswig, [name]  ([id number]) -------------------------------------------------------------------------------- Wound Assessment Details Patient Name: [name]. Date of Service: 09/25/2021 2:15 PM Medical Record Number: [id number] Patient Account Number: [id number] Date of Birth/Sex: August 21, 1955 (66 y.o. M) Treating RN: Levora Dredge Primary Care Kaloni Bisaillon: [name] Other Clinician: Referring Denyse Fillion: [name] Treating Millee Denise/Extender: Jeri Cos Weeks in Treatment: 3 Wound Status Wound Number: 1 Primary Lymphedema Etiology: Wound Location: Right, Circumferential Lower Leg Wound Open Wounding Event: Gradually Appeared Status: Date Acquired: 08/03/2021 Comorbid Chronic Obstructive Pulmonary Disease (COPD), Weeks Of Treatment: 3 History: Arrhythmia, Congestive Heart Failure, Type II Diabetes Clustered Wound: No Wound Measurements Length: (cm) 10 Width: (cm) 4 Depth: (cm) 0.1 Area: (cm) 31.416 Volume: (cm) 3.142 % Reduction in Area: 92.1% % Reduction in Volume: 92.1% Epithelialization: None Tunneling: No Undermining: No Wound Description Classification: Full Thickness Without Exposed Support Structures Exudate Amount: Medium Exudate Type: Serosanguineous Exudate Color: red, brown Foul Odor After Cleansing: No Slough/Fibrino Yes Wound Bed Granulation Amount: Medium (34-66%) Exposed Structure Granulation Quality: Red Fascia Exposed: No Necrotic Amount: Medium (34-66%) Fat Layer (Subcutaneous Tissue) Exposed: Yes Necrotic Quality: Adherent Slough Tendon Exposed: No Muscle Exposed: No Joint Exposed: No Bone Exposed: No Assessment Notes wound appears improved at this nurse visit Treatment Notes Wound #1 (Lower Leg) Wound Laterality: Right, Circumferential Cleanser Soap and Water Discharge Instruction: Gently cleanse wound with antibacterial soap, rinse and pat dry prior to dressing wounds Peri-Wound Care Topical Gentamicin Discharge Instruction: Apply  as directed by Tirsa Gail. Mupirocin Ointment Discharge Instruction: Apply as directed by Nelle Sayed. Primary Dressing Silvercel 4 1/4x 4 1/4 (in/in) Discharge Instruction: Apply Silvercel 4 1/4x 4 1/4 (in/in) as instructed Secondary Dressing Disbrow, [name] ([id number]) (NON-BORDER) Zetuvit Plus Silicone NON-BORDER 5x5 (in/in) Discharge Instruction: Please do not put silicone bordered dressings under wraps. Use non-bordered dressing only under wraps. Secured With Compression Wrap 3-LAYER WRAP - Profore Lite LF 3 Multilayer Compression Bandaging System Discharge Instruction: Apply 3 multi-layer wrap as prescribed. Compression Stockings Add-Ons Electronic Signature(s) Signed: 09/25/2021 4:40:28 PM By: Levora Dredge Entered By: Levora Dredge on 09/25/2021 14:21:11

## 2021-09-25 NOTE — Telephone Encounter (Signed)
Pt returned call. Advised of starting warfarin and risk/benefits of medication. Pt reports he is comfortable with medication because he has taken it in the past. Advised pt of risk and signs and symptoms of bleeding and clot formation. Advised to contact office or go to ER if any signs or symptoms. Pt will start 5 mg warfarin daily, taken after 5 pm daily. Pt has been scheduled for 6/8 at [facility]. Pt verbalized understanding.    Sent in script for warfarin

## 2021-09-25 NOTE — Progress Notes (Signed)
[name], [name] ([id number]) Visit Report for 09/25/2021 Physician Orders Details Patient Name: [name], [name]. Date of Service: 09/25/2021 2:15 PM Medical Record Number: [id number] Patient Account Number: [id number] Date of Birth/Sex: 07-19-1955 (66 y.o. M) Treating RN: Levora Dredge Primary Care Provider: [name] Other Clinician: Referring Provider: [name] Treating Provider/Extender: Skipper Cliche in Treatment: 3 Verbal / Phone Orders: No Diagnosis Coding Follow-up Appointments o Return Appointment in 1 week. Bathing/ Shower/ Hygiene o May shower with wound dressing protected with water repellent cover or cast protector. Edema Control - Lymphedema / Segmental Compressive Device / Other o Elevate, Exercise Daily and Avoid Standing for Long Periods of Time. o Elevate legs to the level of the heart and pump ankles as often as possible o Elevate leg(s) parallel to the floor when sitting. Wound Treatment Wound #1 - Lower Leg Wound Laterality: Right, Circumferential Cleanser: Soap and Water 1 x Per Week/30 Days Discharge Instructions: Gently cleanse wound with antibacterial soap, rinse and pat dry prior to dressing wounds Topical: Gentamicin 1 x Per Week/30 Days Discharge Instructions: Apply as directed by provider. Topical: Mupirocin Ointment 1 x Per Week/30 Days Discharge Instructions: Apply as directed by provider. Primary Dressing: Silvercel 4 1/4x 4 1/4 (in/in) 1 x Per Week/30 Days Discharge Instructions: Apply Silvercel 4 1/4x 4 1/4 (in/in) as instructed Secondary Dressing: (NON-BORDER) Zetuvit Plus Silicone NON-BORDER 5x5 (in/in) 1 x Per Week/30 Days Discharge Instructions: Please do not put silicone bordered dressings under wraps. Use non-bordered dressing only under wraps. Compression Wrap: 3-LAYER WRAP - Profore Lite LF 3 Multilayer Compression Bandaging System 1 x Per Week/30 Days Discharge Instructions: Apply 3 multi-layer wrap as  prescribed. Electronic Signature(s) Signed: 09/25/2021 4:36:57 PM By: Worthy Keeler PA-C Signed: 09/25/2021 4:40:28 PM By: Levora Dredge Entered By: Levora Dredge on 09/25/2021 14:31:50 [name], [name] ([id number]) -------------------------------------------------------------------------------- SuperBill Details Patient Name: [name]. Date of Service: 09/25/2021 Medical Record Number: [id number] Patient Account Number: [id number] Date of Birth/Sex: 1955-06-03 (66 y.o. 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 Chronic venous hypertension (idiopathic) with inflammation of right lower extremity I89.0 Lymphedema, not elsewhere classified E11.622 Type 2 diabetes mellitus with other skin ulcer J44.9 Chronic obstructive pulmonary disease, unspecified I48.91 Unspecified atrial fibrillation Z79.01 Long term (current) use of anticoagulants Facility Procedures CPT4 Code: 93790240 Description: (Facility Use Only) [phone] - [place] Modifier: Quantity: 1 Electronic Signature(s) Signed: 09/25/2021 4:36:57 PM By: Worthy Keeler PA-C Signed: 09/25/2021 4:40:28 PM By: Levora Dredge Entered By: Levora Dredge on 09/25/2021 14:32:21

## 2021-09-27 ENCOUNTER — Other Ambulatory Visit: Payer: Self-pay | Admitting: Family Medicine

## 2021-09-28 NOTE — Telephone Encounter (Signed)
Has appt tomorrow with [facility])

## 2021-09-29 DIAGNOSIS — Z86010 Personal history of colonic polyps: Secondary | ICD-10-CM | POA: Insufficient documentation

## 2021-09-29 DIAGNOSIS — D509 Iron deficiency anemia, unspecified: Secondary | ICD-10-CM | POA: Diagnosis not present

## 2021-09-29 DIAGNOSIS — K429 Umbilical hernia without obstruction or gangrene: Secondary | ICD-10-CM | POA: Diagnosis not present

## 2021-09-30 ENCOUNTER — Encounter: Payer: Medicare Other | Admitting: Physician Assistant

## 2021-09-30 DIAGNOSIS — J449 Chronic obstructive pulmonary disease, unspecified: Secondary | ICD-10-CM | POA: Diagnosis not present

## 2021-09-30 DIAGNOSIS — I4891 Unspecified atrial fibrillation: Secondary | ICD-10-CM | POA: Diagnosis not present

## 2021-09-30 DIAGNOSIS — L97812 Non-pressure chronic ulcer of other part of right lower leg with fat layer exposed: Secondary | ICD-10-CM | POA: Diagnosis not present

## 2021-09-30 DIAGNOSIS — E11622 Type 2 diabetes mellitus with other skin ulcer: Secondary | ICD-10-CM | POA: Diagnosis not present

## 2021-09-30 DIAGNOSIS — I87331 Chronic venous hypertension (idiopathic) with ulcer and inflammation of right lower extremity: Secondary | ICD-10-CM | POA: Diagnosis not present

## 2021-09-30 DIAGNOSIS — I890 Lymphedema, not elsewhere classified: Secondary | ICD-10-CM | POA: Diagnosis not present

## 2021-09-30 DIAGNOSIS — Z7901 Long term (current) use of anticoagulants: Secondary | ICD-10-CM | POA: Diagnosis not present

## 2021-09-30 NOTE — Progress Notes (Addendum)
[name], [name] ([id number]) Visit Report for 09/30/2021 Arrival Information Details Patient Name: [name], [name]. Date of Service: 09/30/2021 3:15 PM Medical Record Number: [id number] Patient Account Number: [id number] Date of Birth/Sex: 10-13-1955 (66 y.o. M) Treating RN: Levora Dredge Primary Care Nathanyl Andujo: [name] Other Clinician: Referring Layan Zalenski: [name] Treating Braylyn Kalter/Extender: Skipper Cliche in Treatment: 4 Visit Information History Since Last Visit Added or deleted any medications: No Patient Arrived: [name] Any new allergies or adverse reactions: No Arrival Time: 15:25 Had a fall or experienced change in No Accompanied By: self activities of daily living that may affect Transfer Assistance: None risk of falls: Patient Identification Verified: Yes Hospitalized since last visit: No Secondary Verification Process Completed: Yes Has Dressing in Place as Prescribed: Yes Patient Requires Transmission-Based Precautions: No Has Compression in Place as Prescribed: No Patient Has Alerts: Yes Pain Present Now: No Patient Alerts: DIABETIC Electronic Signature(s) Signed: 09/30/2021 4:16:45 PM By: Levora Dredge Entered By: Levora Dredge on 09/30/2021 15:27:23 [name], [name] ([id number]) -------------------------------------------------------------------------------- Clinic Level of Care Assessment Details Patient Name: [name]. Date of Service: 09/30/2021 3:15 PM Medical Record Number: [id number] Patient Account Number: [id number] Date of Birth/Sex: 09-12-1955 (66 y.o. M) Treating RN: Levora Dredge Primary Care Fred Hammes: [name] Other Clinician: Referring Raheim Beutler: [name] Treating Rainy Rothman/Extender: Skipper Cliche in Treatment: 4 Clinic Level of Care Assessment Items TOOL 1 Quantity Score '[]'$  - Use when EandM and Procedure is performed on INITIAL visit 0 ASSESSMENTS - Nursing Assessment / Reassessment '[]'$   - General Physical Exam (combine w/ comprehensive assessment (listed just below) when performed on new 0 pt. evals) '[]'$  - 0 Comprehensive Assessment (HX, ROS, Risk Assessments, Wounds Hx, etc.) ASSESSMENTS - Wound and Skin Assessment / Reassessment '[]'$  - Dermatologic / Skin Assessment (not related to wound area) 0 ASSESSMENTS - Ostomy and/or Continence Assessment and Care '[]'$  - Incontinence Assessment and Management 0 '[]'$  - 0 Ostomy Care Assessment and Management (repouching, etc.) PROCESS - Coordination of Care '[]'$  - Simple Patient / Family Education for ongoing care 0 '[]'$  - 0 Complex (extensive) Patient / Family Education for ongoing care '[]'$  - 0 Staff obtains Programmer, systems, Records, Test Results / Process Orders '[]'$  - 0 Staff telephones HHA, Nursing Homes / Clarify orders / etc '[]'$  - 0 Routine Transfer to another Facility (non-emergent condition) '[]'$  - 0 Routine Hospital Admission (non-emergent condition) '[]'$  - 0 New Admissions / Biomedical engineer / Ordering NPWT, Apligraf, etc. '[]'$  - 0 Emergency Hospital Admission (emergent condition) PROCESS - Special Needs '[]'$  - Pediatric / Minor Patient Management 0 '[]'$  - 0 Isolation Patient Management '[]'$  - 0 Hearing / Language / Visual special needs '[]'$  - 0 Assessment of Community assistance (transportation, D/C planning, etc.) '[]'$  - 0 Additional assistance / Altered mentation '[]'$  - 0 Support Surface(s) Assessment (bed, cushion, seat, etc.) INTERVENTIONS - Miscellaneous '[]'$  - External ear exam 0 '[]'$  - 0 Patient Transfer (multiple staff / Civil Service fast streamer / Similar devices) '[]'$  - 0 Simple Staple / Suture removal (25 or less) '[]'$  - 0 Complex Staple / Suture removal (26 or more) '[]'$  - 0 Hypo/Hyperglycemic Management (do not check if billed separately) '[]'$  - 0 Ankle / Brachial Index (ABI) - do not check if billed separately Has the patient been seen at the hospital within the last three years: Yes Total Score: 0 Level Of Care: ____ [name]  ([id number]) Electronic Signature(s) Signed: 09/30/2021 4:16:45 PM By: Levora Dredge Entered By: Levora Dredge on 09/30/2021 16:10:14 [name], [name] ([id number]) --------------------------------------------------------------------------------  Compression Therapy Details Patient Name: [name], [name]. Date of Service: 09/30/2021 3:15 PM Medical Record Number: [id number] Patient Account Number: [id number] Date of Birth/Sex: 26-Jul-1955 (66 y.o. M) Treating RN: Levora Dredge Primary Care Lorita Forinash: [name] Other Clinician: Referring Anastassia Noack: [name] Treating Stormy Connon/Extender: Skipper Cliche in Treatment: 4 Compression Therapy Performed for Wound Assessment: Wound #1 Right,Circumferential Lower Leg Performed By: Clinician Levora Dredge, RN Compression Type: Three Layer Post Procedure Diagnosis Same as Pre-procedure Electronic Signature(s) Signed: 09/30/2021 4:10:05 PM By: Levora Dredge Entered By: Levora Dredge on 09/30/2021 16:10:05 Axon, [name] ([id number]) -------------------------------------------------------------------------------- Encounter Discharge Information Details Patient Name: [name]. Date of Service: 09/30/2021 3:15 PM Medical Record Number: [id number] Patient Account Number: [id number] Date of Birth/Sex: [birth year] (66 y.o. M) Treating RN: Levora Dredge Primary Care Elnoria Livingston: [name] Other Clinician: Referring Timo Hartwig: [name] Treating Todd Argabright/Extender: Skipper Cliche in Treatment: 4 Encounter Discharge Information Items Discharge Condition: Stable Ambulatory Status: Cane Discharge Destination: Home Transportation: Private Auto Accompanied By: self Schedule Follow-up Appointment: Yes Clinical Summary of Care: Electronic Signature(s) Signed: 09/30/2021 4:11:09 PM By: Levora Dredge Entered By: Levora Dredge on 09/30/2021 16:11:09 Balaguer, [name]  ([id number]) -------------------------------------------------------------------------------- Lower Extremity Assessment Details Patient Name: [name]. Date of Service: 09/30/2021 3:15 PM Medical Record Number: [id number] Patient Account Number: [id number] Date of Birth/Sex: 10-05-1955 (66 y.o. M) Treating RN: Levora Dredge Primary Care Oreoluwa Gilmer: [name] Other Clinician: Referring Barnes Florek: [name] Treating Nakaya Mishkin/Extender: Jeri Cos Weeks in Treatment: 4 Edema Assessment Assessed: [Left: No] [Right: No] Edema: [Left: Ye] [Right: s] Calf Left: Right: Point of Measurement: 36 cm From Medial Instep 44.6 cm Ankle Left: Right: Point of Measurement: 12 cm From Medial Instep 28.5 cm Vascular Assessment Pulses: Dorsalis Pedis Palpable: [Right:Yes] Electronic Signature(s) Signed: 09/30/2021 4:16:45 PM By: Levora Dredge Entered By: Levora Dredge on 09/30/2021 15:36:16 [name], [name] ([id number]) -------------------------------------------------------------------------------- Multi Wound Chart Details Patient Name: [name]. Date of Service: 09/30/2021 3:15 PM Medical Record Number: [id number] Patient Account Number: [id number] Date of Birth/Sex: May 04, 1955 (66 y.o. M) Treating RN: Levora Dredge Primary Care Shannia Jacuinde: [name] Other Clinician: Referring Tyleah Loh: [name] Treating Harden Bramer/Extender: Skipper Cliche in Treatment: 4 Vital Signs Height(in): 71 Pulse(bpm): 57 Weight(lbs): 295 Blood Pressure(mmHg): 104/67 Body Mass Index(BMI): 41.1 Temperature(F): 98.3 Respiratory Rate(breaths/min): 18 Photos: [N/A:N/A] Wound Location: Right, Circumferential Lower Leg N/A N/A Wounding Event: Gradually Appeared N/A N/A Primary Etiology: Lymphedema N/A N/A Comorbid History: Chronic Obstructive Pulmonary N/A N/A Disease (COPD), Arrhythmia, Congestive Heart Failure, Type II Diabetes Date Acquired: 08/03/2021 N/A  N/A Weeks of Treatment: 4 N/A N/A Wound Status: Open N/A N/A Wound Recurrence: No N/A N/A Measurements L x W x D (cm) 4.5x2x0.1 N/A N/A Area (cm) : 7.069 N/A N/A Volume (cm) : 0.707 N/A N/A % Reduction in Area: 98.20% N/A N/A % Reduction in Volume: 98.20% N/A N/A Classification: Full Thickness Without Exposed N/A N/A Support Structures Exudate Amount: Medium N/A N/A Exudate Type: Serosanguineous N/A N/A Exudate Color: red, brown N/A N/A Granulation Amount: Small (1-33%) N/A N/A Granulation Quality: Pink, Pale N/A N/A Necrotic Amount: Large (67-100%) N/A N/A Exposed Structures: Fat Layer (Subcutaneous Tissue): N/A N/A Yes Fascia: No Tendon: No Muscle: No Joint: No Bone: No Epithelialization: Small (1-33%) N/A N/A Treatment Notes Electronic Signature(s) Signed: 09/30/2021 4:16:45 PM By: Levora Dredge Entered By: Levora Dredge on 09/30/2021 15:39:55 [name], [name] ([id number]) -------------------------------------------------------------------------------- Multi-Disciplinary Care Plan Details Patient Name: [name]. Date of Service: 09/30/2021 3:15 PM Medical Record Number: [id number] Patient Account  Number: [id number] Date of Birth/Sex: 11-Sep-1955 (66 y.o. M) Treating RN: Levora Dredge Primary Care Quantez Schnyder: [name] Other Clinician: Referring Jerrie Gullo: [name] Treating Zavon Hyson/Extender: Skipper Cliche in Treatment: 4 Active Inactive Abuse / Safety / Falls / Self Care Management Nursing Diagnoses: Potential for falls Goals: Patient will remain injury free related to falls Date Initiated: 09/02/2021 Target Resolution Date: 10/03/2021 Goal Status: Active Interventions: Assess Activities of Daily Living upon admission and as needed Assess fall risk on admission and as needed Assess: immobility, friction, shearing, incontinence upon admission and as needed Assess impairment of mobility on admission and as needed per policy Assess  personal safety and home safety (as indicated) on admission and as needed Assess self care needs on admission and as needed Notes: Necrotic Tissue Nursing Diagnoses: Impaired tissue integrity related to necrotic/devitalized tissue Knowledge deficit related to management of necrotic/devitalized tissue Goals: Necrotic/devitalized tissue will be minimized in the wound bed Date Initiated: 09/23/2021 Target Resolution Date: 09/23/2021 Goal Status: Active Patient/caregiver will verbalize understanding of reason and process for debridement of necrotic tissue Date Initiated: 09/23/2021 Target Resolution Date: 09/23/2021 Goal Status: Active Interventions: Assess patient pain level pre-, during and post procedure and prior to discharge Provide education on necrotic tissue and debridement process Treatment Activities: Excisional debridement : 09/23/2021 Notes: Nutrition Nursing Diagnoses: Impaired glucose control: actual or potential Goals: Patient/caregiver will maintain therapeutic glucose control Date Initiated: 09/02/2021 Target Resolution Date: 10/03/2021 Goal Status: Active Interventions: [name], [name] ([id number]) Assess HgA1c results as ordered upon admission and as needed Assess patient nutrition upon admission and as needed per policy Notes: Wound/Skin Impairment Nursing Diagnoses: Knowledge deficit related to ulceration/compromised skin integrity Goals: Patient/caregiver will verbalize understanding of skin care regimen Date Initiated: 09/02/2021 Target Resolution Date: 10/03/2021 Goal Status: Active Ulcer/skin breakdown will have a volume reduction of 30% by week 4 Date Initiated: 09/02/2021 Target Resolution Date: 10/03/2021 Goal Status: Active Ulcer/skin breakdown will have a volume reduction of 50% by week 8 Date Initiated: 09/02/2021 Target Resolution Date: 11/02/2021 Goal Status: Active Ulcer/skin breakdown will have a volume reduction of 80% by week 12 Date  Initiated: 09/02/2021 Target Resolution Date: 12/03/2021 Goal Status: Active Ulcer/skin breakdown will heal within 14 weeks Date Initiated: 09/02/2021 Target Resolution Date: 01/03/2022 Goal Status: Active Interventions: Assess patient/caregiver ability to obtain necessary supplies Assess patient/caregiver ability to perform ulcer/skin care regimen upon admission and as needed Assess ulceration(s) every visit Notes: Electronic Signature(s) Signed: 09/30/2021 4:16:45 PM By: Levora Dredge Entered By: Levora Dredge on 09/30/2021 15:39:43 [name], [name] ([id number]) -------------------------------------------------------------------------------- Pain Assessment Details Patient Name: [name]. Date of Service: 09/30/2021 3:15 PM Medical Record Number: [id number] Patient Account Number: [id number] Date of Birth/Sex: Oct 02, 1955 (66 y.o. M) Treating RN: Levora Dredge Primary Care Zayra Devito: [name] Other Clinician: Referring Krystan Northrop: [name] Treating Shakela Donati/Extender: Skipper Cliche in Treatment: 4 Active Problems Location of Pain Severity and Description of Pain Patient Has Paino No Site Locations Rate the pain. Current Pain Level: 0 Pain Management and Medication Current Pain Management: Electronic Signature(s) Signed: 09/30/2021 4:16:45 PM By: Levora Dredge Entered By: Levora Dredge on 09/30/2021 15:29:13 [name], [name] ([id number]) -------------------------------------------------------------------------------- Patient/Caregiver Education Details Patient Name: [name]. Date of Service: 09/30/2021 3:15 PM Medical Record Number: [id number] Patient Account Number: [id number] Date of Birth/Gender: 1955-12-16 (66 y.o. M) Treating RN: Levora Dredge Primary Care Physician: [name] Other Clinician: Referring Physician: [name] Treating Physician/Extender: Skipper Cliche in Treatment: 4 Education  Assessment Education Provided To: Patient Education Topics Provided  Wound/Skin Impairment: Handouts: Caring for Your Ulcer Methods: Explain/Verbal Responses: State content correctly Electronic Signature(s) Signed: 09/30/2021 4:16:45 PM By: Levora Dredge Entered By: Levora Dredge on 09/30/2021 16:10:34 [name], [name] ([id number]) -------------------------------------------------------------------------------- Wound Assessment Details Patient Name: [name]. Date of Service: 09/30/2021 3:15 PM Medical Record Number: [id number] Patient Account Number: [id number] Date of Birth/Sex: [birth year] (66 y.o. M) Treating RN: Levora Dredge Primary Care Ryhanna Dunsmore: [name] Other Clinician: Referring Aadon Gorelik: [name] Treating Leoni Goodness/Extender: Jeri Cos Weeks in Treatment: 4 Wound Status Wound Number: 1 Primary Lymphedema Etiology: Wound Location: Right, Circumferential Lower Leg Wound Open Wounding Event: Gradually Appeared Status: Date Acquired: 08/03/2021 Comorbid Chronic Obstructive Pulmonary Disease (COPD), Weeks Of Treatment: 4 History: Arrhythmia, Congestive Heart Failure, Type II Diabetes Clustered Wound: No Photos Wound Measurements Length: (cm) 4.5 Width: (cm) 2 Depth: (cm) 0.1 Area: (cm) 7.069 Volume: (cm) 0.707 % Reduction in Area: 98.2% % Reduction in Volume: 98.2% Epithelialization: Small (1-33%) Tunneling: No Undermining: No Wound Description Classification: Full Thickness Without Exposed Support Structures Exudate Amount: Medium Exudate Type: Serosanguineous Exudate Color: red, brown Foul Odor After Cleansing: No Slough/Fibrino Yes Wound Bed Granulation Amount: Small (1-33%) Exposed Structure Granulation Quality: Pink, Pale Fascia Exposed: No Necrotic Amount: Large (67-100%) Fat Layer (Subcutaneous Tissue) Exposed: Yes Necrotic Quality: Adherent Slough Tendon Exposed: No Muscle Exposed: No Joint Exposed: No Bone  Exposed: No Treatment Notes Wound #1 (Lower Leg) Wound Laterality: Right, Circumferential Cleanser Soap and Water Discharge Instruction: Gently cleanse wound with antibacterial soap, rinse and pat dry prior to dressing wounds Peri-Wound Care RHYS, LICHTY ([id number]) Topical Gentamicin Discharge Instruction: Apply as directed by Linnell Swords. Mupirocin Ointment Discharge Instruction: Apply as directed by Kaelin Holford. Primary Dressing Silvercel 4 1/4x 4 1/4 (in/in) Discharge Instruction: Apply Silvercel 4 1/4x 4 1/4 (in/in) as instructed Secondary Dressing (NON-BORDER) Zetuvit Plus Silicone NON-BORDER 5x5 (in/in) Discharge Instruction: Please do not put silicone bordered dressings under wraps. Use non-bordered dressing only under wraps. Secured With Compression Wrap 3-LAYER WRAP - Profore Lite LF 3 Multilayer Compression Bandaging System Discharge Instruction: Apply 3 multi-layer wrap as prescribed. Compression Stockings Add-Ons Electronic Signature(s) Signed: 09/30/2021 4:16:45 PM By: Levora Dredge Entered By: Levora Dredge on 09/30/2021 15:35:51 Ben, [name] ([id number]) -------------------------------------------------------------------------------- Vitals Details Patient Name: [name]. Date of Service: 09/30/2021 3:15 PM Medical Record Number: [id number] Patient Account Number: [id number] Date of Birth/Sex: [birth year] (66 y.o. M) Treating RN: Levora Dredge Primary Care Anela Bensman: [name] Other Clinician: Referring Nasha Diss: [name] Treating Jandiel Magallanes/Extender: Skipper Cliche in Treatment: 4 Vital Signs Time Taken: 15:27 Temperature (F): 98.3 Height (in): 71 Pulse (bpm): 75 Weight (lbs): 295 Respiratory Rate (breaths/min): 18 Body Mass Index (BMI): 41.1 Blood Pressure (mmHg): 104/67 Reference Range: 80 - 120 mg / dl Electronic Signature(s) Signed: 09/30/2021 4:16:45 PM By: Levora Dredge Entered By: Levora Dredge on  09/30/2021 15:28:50

## 2021-09-30 NOTE — Progress Notes (Addendum)
AMBROSIO, REUTER ([id number]) Visit Report for 09/30/2021 Chief Complaint Document Details Patient Name: [name], [name]. Date of Service: 09/30/2021 3:15 PM Medical Record Number: [id number] Patient Account Number: [id number] Date of Birth/Sex: 1955/12/16 (66 y.o. M) Treating RN: Levora Dredge Primary Care Provider: [name] Other Clinician: Referring Provider: [name] Treating Provider/Extender: Skipper Cliche in Treatment: 4 Information Obtained from: Patient Chief Complaint 09/02/2021; right lower extremity wound Electronic Signature(s) Signed: 09/30/2021 3:21:34 PM By: Worthy Keeler PA-C Entered By: Worthy Keeler on 09/30/2021 15:21:34 [name], [name] ([id number]) -------------------------------------------------------------------------------- HPI Details Patient Name: [name]. Date of Service: 09/30/2021 3:15 PM Medical Record Number: [id number] Patient Account Number: [id number] Date of Birth/Sex: 03/22/1956 (66 y.o. M) Treating RN: Levora Dredge Primary Care Provider: [name] Other Clinician: Referring Provider: [name] Treating Provider/Extender: Skipper Cliche in Treatment: 4 History of Present Illness HPI Description: Admission 09/02/2021 [name] is a 66 year old male with a past medical history of A-fib, COPD, and OSA that presents the clinic for a 1 month history of nonhealing wounds to the right lower extremity. He states he had increased swelling to his lower extremities despite being on Lasix 40 mg twice daily About 4 weeks ago. He developed blisters and these opened into wounds To both legs. He states that his Lasix was increased to 80 mg twice daily and this helped significantly with his swelling and wound healing. He currently only has wounds to the right lower extremity that are weeping. He has been keeping the areas covered. He currently denies systemic signs of infection. He denies shortness  of breath, orthopnea, fever/chills, nausea/vomiting 5/17; patient presents for follow-up. He states that the day after the wrap was placed it slid down and he took it off. He has had no compression on the leg for almost a week. He states he was busy and did not call our office to be rewrapped. Currently denies signs of infection. 5/24 ;Patient presents for follow-up. He states that the compression wrap came off 2 days after it was placed in office. He states that there was too much drainage and it went through the wrap. He has been keeping the area covered. He did not want to call and come back in to get it really wrapped. He denies signs of infection. 5/31; patient presents for follow-up. He states that the wrap had stayed in place until about 1 hour before he came in to the clinic because he wanted to take a shower. He reports improvement to wound healing. 09-30-2021 upon evaluation today patient appears to be doing well currently in regard to his wound in fact he is making excellent progress is measuring much smaller. Fortunately I do not see any evidence of active infection at this time and though he does not keep the wrap on the whole time it seems that it starts to unravel as he probably has restless leg syndrome and once the outer portion comes off then the Coban gets stuck in his sheets and eventually it just pulls down. Nonetheless I do believe that we might be able to try the pantyhose over instead of the cloth to see if this will do better as far as staying in place and preventing it from coming loose. Electronic Signature(s) Signed: 10/01/2021 7:59:55 AM By: Worthy Keeler PA-C Entered By: Worthy Keeler on 10/01/2021 07:59:54 [name], [name] ([id number]) -------------------------------------------------------------------------------- Physical Exam Details Patient Name: [name]. Date of Service: 09/30/2021 3:15 PM Medical Record Number: [id number]  Patient Account Number:  [id number] Date of Birth/Sex: 03-10-1956 (66 y.o. M) Treating RN: Levora Dredge Primary Care Provider: [name] Other Clinician: Referring Provider: [name] Treating Provider/Extender: Jeri Cos Weeks in Treatment: 4 Constitutional Well-nourished and well-hydrated in no acute distress. Respiratory normal breathing without difficulty. Psychiatric this patient is able to make decisions and demonstrates good insight into disease process. Alert and Oriented x 3. pleasant and cooperative. Notes Upon inspection patient's wound bed actually showed signs of good granulation and epithelization overall I think that he is on the right track this is measuring a lot smaller it looks a lot cleaner and overall I think that the patient is pleased as well though he does not like the wrap he understands the necessity. Electronic Signature(s) Signed: 10/01/2021 8:00:14 AM By: Worthy Keeler PA-C Entered By: Worthy Keeler on 10/01/2021 08:00:14 [name], [name] ([id number]) -------------------------------------------------------------------------------- Physician Orders Details Patient Name: [name]. Date of Service: 09/30/2021 3:15 PM Medical Record Number: [id number] Patient Account Number: [id number] Date of Birth/Sex: 09/10/1955 (66 y.o. M) Treating RN: Levora Dredge Primary Care Provider: [name] Other Clinician: Referring Provider: [name] Treating Provider/Extender: Skipper Cliche in Treatment: 4 Verbal / Phone Orders: No Diagnosis Coding ICD-10 Coding Code Description [phone] Non-pressure chronic ulcer of other part of right lower leg with fat layer exposed I87.321 Chronic venous hypertension (idiopathic) with inflammation of right lower extremity I89.0 Lymphedema, not elsewhere classified E11.622 Type 2 diabetes mellitus with other skin ulcer J44.9 Chronic obstructive pulmonary disease, unspecified I48.91 Unspecified atrial  fibrillation Z79.01 Long term (current) use of anticoagulants Follow-up Appointments o Return Appointment in 1 week. Bathing/ Shower/ Hygiene o May shower with wound dressing protected with water repellent cover or cast protector. Edema Control - Lymphedema / Segmental Compressive Device / Other o Elevate, Exercise Daily and Avoid Standing for Long Periods of Time. o Elevate legs to the level of the heart and pump ankles as often as possible o Elevate leg(s) parallel to the floor when sitting. Wound Treatment Wound #1 - Lower Leg Wound Laterality: Right, Circumferential Cleanser: Soap and Water 1 x Per Week/30 Days Discharge Instructions: Gently cleanse wound with antibacterial soap, rinse and pat dry prior to dressing wounds Topical: Gentamicin 1 x Per Week/30 Days Discharge Instructions: Apply as directed by provider. Topical: Mupirocin Ointment 1 x Per Week/30 Days Discharge Instructions: Apply as directed by provider. Primary Dressing: Silvercel 4 1/4x 4 1/4 (in/in) 1 x Per Week/30 Days Discharge Instructions: Apply Silvercel 4 1/4x 4 1/4 (in/in) as instructed Secondary Dressing: (NON-BORDER) Zetuvit Plus Silicone NON-BORDER 5x5 (in/in) 1 x Per Week/30 Days Discharge Instructions: Please do not put silicone bordered dressings under wraps. Use non-bordered dressing only under wraps. Compression Wrap: 3-LAYER WRAP - Profore Lite LF 3 Multilayer Compression Bandaging System 1 x Per Week/30 Days Discharge Instructions: Apply 3 multi-layer wrap as prescribed. Electronic Signature(s) Signed: 09/30/2021 4:16:45 PM By: Levora Dredge Signed: 10/01/2021 4:11:43 PM By: Worthy Keeler PA-C Entered By: Levora Dredge on 09/30/2021 15:43:43 [name], [name] ([id number]) -------------------------------------------------------------------------------- Problem List Details Patient Name: [name]. Date of Service: 09/30/2021 3:15 PM Medical Record Number: [id number] Patient  Account Number: [id number] Date of Birth/Sex: October 30, 1955 (66 y.o. M) Treating RN: Levora Dredge Primary Care Provider: [name] Other Clinician: Referring Provider: [name] Treating Provider/Extender: Skipper Cliche in Treatment: 4 Active Problems ICD-10 Encounter Code Description Active Date MDM Diagnosis [phone] Non-pressure chronic ulcer of other part of right lower leg with fat layer 09/02/2021 No  Yes exposed I87.321 Chronic venous hypertension (idiopathic) with inflammation of right 09/02/2021 No Yes lower extremity I89.0 Lymphedema, not elsewhere classified 09/02/2021 No Yes E11.622 Type 2 diabetes mellitus with other skin ulcer 09/02/2021 No Yes J44.9 Chronic obstructive pulmonary disease, unspecified 09/02/2021 No Yes I48.91 Unspecified atrial fibrillation 09/02/2021 No Yes Z79.01 Long term (current) use of anticoagulants 09/02/2021 No Yes Inactive Problems Resolved Problems Electronic Signature(s) Signed: 09/30/2021 3:21:31 PM By: Worthy Keeler PA-C Entered By: Worthy Keeler on 09/30/2021 15:21:31 [name], [name] ([id number]) -------------------------------------------------------------------------------- Progress Note Details Patient Name: [name]. Date of Service: 09/30/2021 3:15 PM Medical Record Number: [id number] Patient Account Number: [id number] Date of Birth/Sex: 10-01-1955 (66 y.o. M) Treating RN: Levora Dredge Primary Care Provider: [name] Other Clinician: Referring Provider: [name] Treating Provider/Extender: Skipper Cliche in Treatment: 4 Subjective Chief Complaint Information obtained from Patient 09/02/2021; right lower extremity wound History of Present Illness (HPI) Admission 09/02/2021 Mr. Kasean Denherder is a 66 year old male with a past medical history of A-fib, COPD, and OSA that presents the clinic for a 1 month history of nonhealing wounds to the right lower extremity. He states he had  increased swelling to his lower extremities despite being on Lasix 40 mg twice daily About 4 weeks ago. He developed blisters and these opened into wounds To both legs. He states that his Lasix was increased to 80 mg twice daily and this helped significantly with his swelling and wound healing. He currently only has wounds to the right lower extremity that are weeping. He has been keeping the areas covered. He currently denies systemic signs of infection. He denies shortness of breath, orthopnea, fever/chills, nausea/vomiting 5/17; patient presents for follow-up. He states that the day after the wrap was placed it slid down and he took it off. He has had no compression on the leg for almost a week. He states he was busy and did not call our office to be rewrapped. Currently denies signs of infection. 5/24 ;Patient presents for follow-up. He states that the compression wrap came off 2 days after it was placed in office. He states that there was too much drainage and it went through the wrap. He has been keeping the area covered. He did not want to call and come back in to get it really wrapped. He denies signs of infection. 5/31; patient presents for follow-up. He states that the wrap had stayed in place until about 1 hour before he came in to the clinic because he wanted to take a shower. He reports improvement to wound healing. 09-30-2021 upon evaluation today patient appears to be doing well currently in regard to his wound in fact he is making excellent progress is measuring much smaller. Fortunately I do not see any evidence of active infection at this time and though he does not keep the wrap on the whole time it seems that it starts to unravel as he probably has restless leg syndrome and once the outer portion comes off then the Coban gets stuck in his sheets and eventually it just pulls down. Nonetheless I do believe that we might be able to try the pantyhose over instead of the cloth to see if  this will do better as far as staying in place and preventing it from coming loose. Objective Constitutional Well-nourished and well-hydrated in no acute distress. Vitals Time Taken: 3:27 PM, Height: 71 in, Weight: 295 lbs, BMI: 41.1, Temperature: 98.3 F, Pulse: 75 bpm, Respiratory Rate: 18 breaths/min, Blood Pressure: 104/67  mmHg. Respiratory normal breathing without difficulty. Psychiatric this patient is able to make decisions and demonstrates good insight into disease process. Alert and Oriented x 3. pleasant and cooperative. General Notes: Upon inspection patient's wound bed actually showed signs of good granulation and epithelization overall I think that he is on the right track this is measuring a lot smaller it looks a lot cleaner and overall I think that the patient is pleased as well though he does not like the wrap he understands the necessity. Integumentary (Hair, Skin) Wound #1 status is Open. Original cause of wound was Gradually Appeared. The date acquired was: 08/03/2021. The wound has been in treatment 4 weeks. The wound is located on the Right,Circumferential Lower Leg. The wound measures 4.5cm length x 2cm width x 0.1cm depth; 7.069cm^2 area and 0.707cm^3 volume. There is Fat Layer (Subcutaneous Tissue) exposed. There is no tunneling or undermining noted. There is a medium amount of serosanguineous drainage noted. There is small (1-33%) pink, pale granulation within the wound bed. There is a large Fester, Raynold P. ([id number]) (67-100%) amount of necrotic tissue within the wound bed including Adherent Slough. Assessment Active Problems ICD-10 Non-pressure chronic ulcer of other part of right lower leg with fat layer exposed Chronic venous hypertension (idiopathic) with inflammation of right lower extremity Lymphedema, not elsewhere classified Type 2 diabetes mellitus with other skin ulcer Chronic obstructive pulmonary disease, unspecified Unspecified atrial  fibrillation Long term (current) use of anticoagulants Procedures Wound #1 Pre-procedure diagnosis of Wound #1 is a Lymphedema located on the Right,Circumferential Lower Leg . There was a Three Layer Compression Therapy Procedure by Levora Dredge, RN. Post procedure Diagnosis Wound #1: Same as Pre-Procedure Plan Follow-up Appointments: Return Appointment in 1 week. Bathing/ Shower/ Hygiene: May shower with wound dressing protected with water repellent cover or cast protector. Edema Control - Lymphedema / Segmental Compressive Device / Other: Elevate, Exercise Daily and Avoid Standing for Long Periods of Time. Elevate legs to the level of the heart and pump ankles as often as possible Elevate leg(s) parallel to the floor when sitting. WOUND #1: - Lower Leg Wound Laterality: Right, Circumferential Cleanser: Soap and Water 1 x Per Week/30 Days Discharge Instructions: Gently cleanse wound with antibacterial soap, rinse and pat dry prior to dressing wounds Topical: Gentamicin 1 x Per Week/30 Days Discharge Instructions: Apply as directed by provider. Topical: Mupirocin Ointment 1 x Per Week/30 Days Discharge Instructions: Apply as directed by provider. Primary Dressing: Silvercel 4 1/4x 4 1/4 (in/in) 1 x Per Week/30 Days Discharge Instructions: Apply Silvercel 4 1/4x 4 1/4 (in/in) as instructed Secondary Dressing: (NON-BORDER) Zetuvit Plus Silicone NON-BORDER 5x5 (in/in) 1 x Per Week/30 Days Discharge Instructions: Please do not put silicone bordered dressings under wraps. Use non-bordered dressing only under wraps. Compression Wrap: 3-LAYER WRAP - Profore Lite LF 3 Multilayer Compression Bandaging System 1 x Per Week/30 Days Discharge Instructions: Apply 3 multi-layer wrap as prescribed. 1. I am going to recommend that we go ahead and continue with the wound care measures as before and the patient is in agreement with plan. This includes the use of the silver alginate dressing followed  by the Zetuvit and then a 3 layer compression wrap. 2. Recommend try the pantyhose over top of the wrap in place of the cloth we normally use to see if this will stay in place little better and keep it from getting stuck to his sheets no guarantee but my hope is this may work a little better. We will see patient  back for reevaluation in 1 week here in the clinic. If anything worsens or changes patient will contact our office for additional recommendations. Electronic Signature(s) Signed: 10/01/2021 8:03:03 AM By: Irean Hong [name], [name] ([id number]) Entered By: Worthy Keeler on 10/01/2021 08:03:02 [name], [name] ([id number]) -------------------------------------------------------------------------------- SuperBill Details Patient Name: [name]. Date of Service: 09/30/2021 Medical Record Number: [id number] Patient Account Number: [id number] Date of Birth/Sex: 1955/07/21 (66 y.o. M) Treating RN: Levora Dredge Primary Care Provider: [name] Other Clinician: Referring Provider: [name] Treating Provider/Extender: Skipper Cliche in Treatment: 4 Diagnosis Coding ICD-10 Codes Code Description [phone] Non-pressure chronic ulcer of other part of right lower leg with fat layer exposed I87.321 Chronic venous hypertension (idiopathic) with inflammation of right lower extremity I89.0 Lymphedema, not elsewhere classified E11.622 Type 2 diabetes mellitus with other skin ulcer J44.9 Chronic obstructive pulmonary disease, unspecified I48.91 Unspecified atrial fibrillation Z79.01 Long term (current) use of anticoagulants Facility Procedures CPT4 Code: [id number] Description: (Facility Use Only) [phone] - [facility] RT LEG Modifier: Quantity: 1 Physician Procedures CPT4 Code: [id number] Description: 11572 - WC PHYS LEVEL 3 - EST PT Modifier: Quantity: 1 CPT4 Code: Description: ICD-10 Diagnosis Description I20.355 Non-pressure chronic  ulcer of other part of right lower leg with fat l I87.321 Chronic venous hypertension (idiopathic) with inflammation of right lo I89.0 Lymphedema, not elsewhere classified E11.622  Type 2 diabetes mellitus with other skin ulcer Modifier: ayer exposed wer extremity Quantity: Electronic Signature(s) Signed: 10/01/2021 8:05:04 AM By: Worthy Keeler PA-C Previous Signature: 09/30/2021 4:10:24 PM Version By: Levora Dredge Entered By: Worthy Keeler on 10/01/2021 08:05:03

## 2021-10-01 ENCOUNTER — Ambulatory Visit (INDEPENDENT_AMBULATORY_CARE_PROVIDER_SITE_OTHER): Payer: Medicare Other

## 2021-10-01 DIAGNOSIS — Z7901 Long term (current) use of anticoagulants: Secondary | ICD-10-CM | POA: Diagnosis not present

## 2021-10-01 LAB — POCT INR: INR: 5.5 — AB (ref 2.0–3.0)

## 2021-10-01 NOTE — Patient Instructions (Addendum)
Pre visit review using our clinic review tool, if applicable. No additional management support is needed unless otherwise documented below in the visit note.  Hold dose tomorrow and hold dose the day after tomorrow and only take 1/2 tablet on Sunday and then change weekly dose to take 1 tablet daily except take 1/2 tablet on Mondays and Thursdays. Recheck in 1 week.   A full discussion of the nature of anticoagulants has been carried out.  A benefit risk analysis has been presented to the patient, so that they understand the justification for choosing anticoagulation at this time. The need for frequent and regular monitoring, precise dosage adjustment and compliance is stressed.  Side effects of potential bleeding are discussed.  The patient should avoid any OTC items containing aspirin or ibuprofen, and should avoid great swings in general diet.  Avoid alcohol consumption.  Call if any signs of abnormal bleeding.  Next PT/INR test in 6/15 at 3:30. Pt verbalized understanding.

## 2021-10-01 NOTE — Progress Notes (Addendum)
Pt is restarting warfarin after being on it years ago for Afib. Pt was also started on amiodarone also.  Pt was started on amiodarone but dose is now 1 tablet daily after starting 2 weeks of 2 tablets daily. Pt reports a capsule endoscopy 6/12. He was told to take medication in the morning the day before the procedure. He was also told he can restart medication after he returns to the office after 4 pm on 6/12.  A full discussion of the nature of anticoagulants has been carried out.  A benefit risk analysis has been presented to the patient, so that they understand the justification for choosing anticoagulation at this time. The need for frequent and regular monitoring, precise dosage adjustment and compliance is stressed.  Side effects of potential bleeding are discussed.  The patient should avoid any OTC items containing aspirin or ibuprofen, and should avoid great swings in general diet.  Avoid alcohol consumption.  Call if any signs of abnormal bleeding.  Next PT/INR test in 6/15 at 3:30. Pt verbalized understanding. Pt was also given anticoagulation ZONE tool with coumadin clinic phone number and [facility] brochure.   Pt already took warfarin today.  Hold dose tomorrow and hold dose the day after tomorrow and only take 1/2 tablet on Sunday and then change weekly dose to take 1 tablet daily except take 1/2 tablet on Mondays and Thursdays. Recheck in 1 week.

## 2021-10-05 DIAGNOSIS — D509 Iron deficiency anemia, unspecified: Secondary | ICD-10-CM | POA: Diagnosis not present

## 2021-10-07 ENCOUNTER — Encounter (HOSPITAL_BASED_OUTPATIENT_CLINIC_OR_DEPARTMENT_OTHER): Payer: Medicare Other | Admitting: Internal Medicine

## 2021-10-07 DIAGNOSIS — E11622 Type 2 diabetes mellitus with other skin ulcer: Secondary | ICD-10-CM | POA: Diagnosis not present

## 2021-10-07 DIAGNOSIS — L97812 Non-pressure chronic ulcer of other part of right lower leg with fat layer exposed: Secondary | ICD-10-CM

## 2021-10-07 DIAGNOSIS — I87321 Chronic venous hypertension (idiopathic) with inflammation of right lower extremity: Secondary | ICD-10-CM

## 2021-10-07 DIAGNOSIS — I890 Lymphedema, not elsewhere classified: Secondary | ICD-10-CM | POA: Diagnosis not present

## 2021-10-07 DIAGNOSIS — I87331 Chronic venous hypertension (idiopathic) with ulcer and inflammation of right lower extremity: Secondary | ICD-10-CM | POA: Diagnosis not present

## 2021-10-07 DIAGNOSIS — Z7901 Long term (current) use of anticoagulants: Secondary | ICD-10-CM | POA: Diagnosis not present

## 2021-10-07 DIAGNOSIS — I4891 Unspecified atrial fibrillation: Secondary | ICD-10-CM | POA: Diagnosis not present

## 2021-10-07 DIAGNOSIS — J449 Chronic obstructive pulmonary disease, unspecified: Secondary | ICD-10-CM | POA: Diagnosis not present

## 2021-10-08 ENCOUNTER — Other Ambulatory Visit: Payer: Self-pay | Admitting: Nurse Practitioner

## 2021-10-08 ENCOUNTER — Ambulatory Visit (INDEPENDENT_AMBULATORY_CARE_PROVIDER_SITE_OTHER): Payer: Medicare Other

## 2021-10-08 DIAGNOSIS — J9811 Atelectasis: Secondary | ICD-10-CM | POA: Diagnosis not present

## 2021-10-08 DIAGNOSIS — D509 Iron deficiency anemia, unspecified: Secondary | ICD-10-CM | POA: Diagnosis not present

## 2021-10-08 DIAGNOSIS — K229 Disease of esophagus, unspecified: Secondary | ICD-10-CM

## 2021-10-08 DIAGNOSIS — M47812 Spondylosis without myelopathy or radiculopathy, cervical region: Secondary | ICD-10-CM | POA: Diagnosis not present

## 2021-10-08 DIAGNOSIS — Z7901 Long term (current) use of anticoagulants: Secondary | ICD-10-CM | POA: Diagnosis not present

## 2021-10-08 DIAGNOSIS — T189XXA Foreign body of alimentary tract, part unspecified, initial encounter: Secondary | ICD-10-CM | POA: Diagnosis not present

## 2021-10-08 DIAGNOSIS — M4312 Spondylolisthesis, cervical region: Secondary | ICD-10-CM | POA: Diagnosis not present

## 2021-10-08 DIAGNOSIS — I872 Venous insufficiency (chronic) (peripheral): Secondary | ICD-10-CM | POA: Diagnosis not present

## 2021-10-08 LAB — POCT INR: INR: 3.7 — AB (ref 2.0–3.0)

## 2021-10-08 NOTE — Progress Notes (Cosign Needed Addendum)
Hold dose today and then change weekly dose to take 1 tablet daily except take 1/2 tablet on Mondays, Thursdays, and Saturdays. Recheck in 1 week.

## 2021-10-08 NOTE — Patient Instructions (Addendum)
Pre visit review using our clinic review tool, if applicable. No additional management support is needed unless otherwise documented below in the visit note.  Hold dose today and then change weekly dose to take 1 tablet daily except take 1/2 tablet on Mondays, Thursdays, and Saturdays. Recheck in 1 week.

## 2021-10-14 ENCOUNTER — Inpatient Hospital Stay: Payer: Medicare Other

## 2021-10-14 ENCOUNTER — Encounter (HOSPITAL_BASED_OUTPATIENT_CLINIC_OR_DEPARTMENT_OTHER): Payer: Medicare Other | Admitting: Internal Medicine

## 2021-10-14 ENCOUNTER — Inpatient Hospital Stay: Payer: Medicare Other | Admitting: Oncology

## 2021-10-14 DIAGNOSIS — I87331 Chronic venous hypertension (idiopathic) with ulcer and inflammation of right lower extremity: Secondary | ICD-10-CM | POA: Diagnosis not present

## 2021-10-14 DIAGNOSIS — E11622 Type 2 diabetes mellitus with other skin ulcer: Secondary | ICD-10-CM | POA: Diagnosis not present

## 2021-10-14 DIAGNOSIS — L97812 Non-pressure chronic ulcer of other part of right lower leg with fat layer exposed: Secondary | ICD-10-CM

## 2021-10-14 DIAGNOSIS — J449 Chronic obstructive pulmonary disease, unspecified: Secondary | ICD-10-CM | POA: Diagnosis not present

## 2021-10-14 DIAGNOSIS — I87321 Chronic venous hypertension (idiopathic) with inflammation of right lower extremity: Secondary | ICD-10-CM

## 2021-10-14 DIAGNOSIS — I4891 Unspecified atrial fibrillation: Secondary | ICD-10-CM | POA: Diagnosis not present

## 2021-10-14 DIAGNOSIS — Z7901 Long term (current) use of anticoagulants: Secondary | ICD-10-CM | POA: Diagnosis not present

## 2021-10-14 DIAGNOSIS — I890 Lymphedema, not elsewhere classified: Secondary | ICD-10-CM | POA: Diagnosis not present

## 2021-10-14 NOTE — Progress Notes (Signed)
DIONICIO, SHELNUTT ([id number]) Visit Report for 10/14/2021 Arrival Information Details Patient Name: [name], [name]. Date of Service: 10/14/2021 12:30 PM Medical Record Number: [id number] Patient Account Number: [id number] Date of Birth/Sex: 08/05/1955 (66 y.o. M) Treating RN: Carlene Coria Primary Care Davarius Ridener: [name] Other Clinician: Referring Silverio Hagan: [name] Treating Zury Fazzino/Extender: Yaakov Guthrie in Treatment: 6 Visit Information History Since Last Visit All ordered tests and consults were completed: No Patient Arrived: Ambulatory Added or deleted any medications: No Arrival Time: 12:37 Any new allergies or adverse reactions: No Accompanied By: self Had a fall or experienced change in No Transfer Assistance: None activities of daily living that may affect Patient Identification Verified: Yes risk of falls: Secondary Verification Process Completed: Yes Signs or symptoms of abuse/neglect since last visito No Patient Requires Transmission-Based Precautions: No Hospitalized since last visit: No Patient Has Alerts: Yes Implantable device outside of the clinic excluding No Patient Alerts: DIABETIC cellular tissue based products placed in the center since last visit: Has Dressing in Place as Prescribed: Yes Has Compression in Place as Prescribed: No Pain Present Now: No Electronic Signature(s) Signed: 10/14/2021 2:40:31 PM By: Carlene Coria RN Entered By: Carlene Coria on 10/14/2021 12:42:08 [name], [name] ([id number]) -------------------------------------------------------------------------------- Clinic Level of Care Assessment Details Patient Name: [name] Sans. Date of Service: 10/14/2021 12:30 PM Medical Record Number: [id number] Patient Account Number: [id number] Date of Birth/Sex: March 06, 1956 (66 y.o. M) Treating RN: Carlene Coria Primary Care Harvey Lingo: [name] Other Clinician: Referring Jasmeet Gehl: [name] Treating Errika Narvaiz/Extender: Yaakov Guthrie in Treatment: 6 Clinic Level of Care Assessment Items TOOL 1 Quantity Score '[]'$  - Use when EandM and Procedure is performed on INITIAL visit 0 ASSESSMENTS - Nursing Assessment / Reassessment '[]'$  - General Physical Exam (combine w/ comprehensive assessment (listed just below) when performed on new 0 pt. evals) '[]'$  - 0 Comprehensive Assessment (HX, ROS, Risk Assessments, Wounds Hx, etc.) ASSESSMENTS - Wound and Skin Assessment / Reassessment '[]'$  - Dermatologic / Skin Assessment (not related to wound area) 0 ASSESSMENTS - Ostomy and/or Continence Assessment and Care '[]'$  - Incontinence Assessment and Management 0 '[]'$  - 0 Ostomy Care Assessment and Management (repouching, etc.) PROCESS - Coordination of Care '[]'$  - Simple Patient / Family Education for ongoing care 0 '[]'$  - 0 Complex (extensive) Patient / Family Education for ongoing care '[]'$  - 0 Staff obtains Programmer, systems, Records, Test Results / Process Orders '[]'$  - 0 Staff telephones HHA, Nursing Homes / Clarify orders / etc '[]'$  - 0 Routine Transfer to another Facility (non-emergent condition) '[]'$  - 0 Routine Hospital Admission (non-emergent condition) '[]'$  - 0 New Admissions / Biomedical engineer / Ordering NPWT, Apligraf, etc. '[]'$  - 0 Emergency Hospital Admission (emergent condition) PROCESS - Special Needs '[]'$  - Pediatric / Minor Patient Management 0 '[]'$  - 0 Isolation Patient Management '[]'$  - 0 Hearing / Language / Visual special needs '[]'$  - 0 Assessment of Community assistance (transportation, D/C planning, etc.) '[]'$  - 0 Additional assistance / Altered mentation '[]'$  - 0 Support Surface(s) Assessment (bed, cushion, seat, etc.) INTERVENTIONS - Miscellaneous '[]'$  - External ear exam 0 '[]'$  - 0 Patient Transfer (multiple staff / Civil Service fast streamer / Similar devices) '[]'$  - 0 Simple Staple / Suture removal (25 or less) '[]'$  - 0 Complex Staple / Suture removal (26 or more) '[]'$  -  0 Hypo/Hyperglycemic Management (do not check if billed separately) '[]'$  - 0 Ankle / Brachial Index (ABI) - do not check if billed separately Has the patient been seen at the hospital  within the last three years: Yes Total Score: 0 Level Of Care: ____ [name] Sans ([id number]) Electronic Signature(s) Signed: 10/14/2021 2:40:31 PM By: Carlene Coria RN Entered By: Carlene Coria on 10/14/2021 13:24:53 [name], [name] ([id number]) -------------------------------------------------------------------------------- Encounter Discharge Information Details Patient Name: [name] Sans. Date of Service: 10/14/2021 12:30 PM Medical Record Number: [id number] Patient Account Number: [id number] Date of Birth/Sex: 1955-10-18 (66 y.o. M) Treating RN: Carlene Coria Primary Care Council Munguia: [name] Other Clinician: Referring Lilliemae Fruge: [name] Treating Jasman Murri/Extender: Yaakov Guthrie in Treatment: 6 Encounter Discharge Information Items Post Procedure Vitals Discharge Condition: Stable Temperature (F): 98.2 Ambulatory Status: Ambulatory Pulse (bpm): 74 Discharge Destination: Home Respiratory Rate (breaths/min): 18 Transportation: Private Auto Blood Pressure (mmHg): 124/73 Accompanied By: self Schedule Follow-up Appointment: Yes Clinical Summary of Care: Electronic Signature(s) Signed: 10/14/2021 2:40:31 PM By: Carlene Coria RN Entered By: Carlene Coria on 10/14/2021 13:25:50 [name], [name] ([id number]) -------------------------------------------------------------------------------- Lower Extremity Assessment Details Patient Name: [name] Sans. Date of Service: 10/14/2021 12:30 PM Medical Record Number: [id number] Patient Account Number: [id number] Date of Birth/Sex: 02-27-1956 (66 y.o. M) Treating RN: Carlene Coria Primary Care Elania Crowl: [name] Other Clinician: Referring Akela Pocius: [name] Treating Reannon Candella/Extender: Yaakov Guthrie in Treatment: 6 Edema Assessment Assessed: [Left: No] [Right: No] Edema: [Left: Ye] [Right: s] Calf Left: Right: Point of Measurement: 38 cm From Medial Instep 48 cm Ankle Left: Right: Point of Measurement: 12 cm From Medial Instep 26 cm Knee To Floor Left: Right: From Medial Instep 44 cm Vascular Assessment Pulses: Dorsalis Pedis Palpable: [Right:Yes] Electronic Signature(s) Signed: 10/14/2021 2:40:31 PM By: Carlene Coria RN Entered By: Carlene Coria on 10/14/2021 12:46:58 [name], [name] ([id number]) -------------------------------------------------------------------------------- Multi Wound Chart Details Patient Name: [name] Sans. Date of Service: 10/14/2021 12:30 PM Medical Record Number: [id number] Patient Account Number: [id number] Date of Birth/Sex: 08/16/1955 (66 y.o. M) Treating RN: Carlene Coria Primary Care Larie Mathes: [name] Other Clinician: Referring Joslyn Ramos: [name] Treating Lara Palinkas/Extender: Yaakov Guthrie in Treatment: 6 Vital Signs Height(in): 71 Pulse(bpm): 74 Weight(lbs): 573 Blood Pressure(mmHg): 124/73 Body Mass Index(BMI): 41.1 Temperature(F): 98.2 Respiratory Rate(breaths/min): 16 Photos: [N/A:N/A] Wound Location: Right, Circumferential Lower Leg N/A N/A Wounding Event: Gradually Appeared N/A N/A Primary Etiology: Lymphedema N/A N/A Comorbid History: Chronic Obstructive Pulmonary N/A N/A Disease (COPD), Arrhythmia, Congestive Heart Failure, Type II Diabetes Date Acquired: 08/03/2021 N/A N/A Weeks of Treatment: 6 N/A N/A Wound Status: Open N/A N/A Wound Recurrence: No N/A N/A Measurements L x W x D (cm) 9x3.5x0.1 N/A N/A Area (cm) : 24.74 N/A N/A Volume (cm) : 2.474 N/A N/A % Reduction in Area: 93.80% N/A N/A % Reduction in Volume: 93.80% N/A N/A Classification: Full Thickness Without Exposed N/A N/A Support Structures Exudate Amount: Medium N/A N/A Exudate Type: Serosanguineous N/A  N/A Exudate Color: red, brown N/A N/A Granulation Amount: Medium (34-66%) N/A N/A Granulation Quality: Red, Pink N/A N/A Necrotic Amount: Medium (34-66%) N/A N/A Exposed Structures: Fat Layer (Subcutaneous Tissue): N/A N/A Yes Fascia: No Tendon: No Muscle: No Joint: No Bone: No Epithelialization: Small (1-33%) N/A N/A Treatment Notes Electronic Signature(s) Signed: 10/14/2021 2:40:31 PM By: Carlene Coria RN Entered By: Carlene Coria on 10/14/2021 12:47:09 [name], [name] ([id number]) -------------------------------------------------------------------------------- [place] Details Patient Name: [name] Sans. Date of Service: 10/14/2021 12:30 PM Medical Record Number: [id number] Patient Account Number: [id number] Date of Birth/Sex: 1955-09-25 (66 y.o. M) Treating RN: Carlene Coria Primary Care Mattia Osterman: [name] Other Clinician: Referring Tala Eber: [name] Treating Sire Poet/Extender: Yaakov Guthrie in Treatment:  6 Active Inactive Abuse / Safety / Falls / Self Care Management Nursing Diagnoses: Potential for falls Goals: Patient will remain injury free related to falls Date Initiated: 09/02/2021 Target Resolution Date: 10/03/2021 Goal Status: Active Interventions: Assess Activities of Daily Living upon admission and as needed Assess fall risk on admission and as needed Assess: immobility, friction, shearing, incontinence upon admission and as needed Assess impairment of mobility on admission and as needed per policy Assess personal safety and home safety (as indicated) on admission and as needed Assess self care needs on admission and as needed Notes: Necrotic Tissue Nursing Diagnoses: Impaired tissue integrity related to necrotic/devitalized tissue Knowledge deficit related to management of necrotic/devitalized tissue Goals: Necrotic/devitalized tissue will be minimized in the wound bed Date Initiated: 09/23/2021 Target  Resolution Date: 09/23/2021 Goal Status: Active Patient/caregiver will verbalize understanding of reason and process for debridement of necrotic tissue Date Initiated: 09/23/2021 Target Resolution Date: 09/23/2021 Goal Status: Active Interventions: Assess patient pain level pre-, during and post procedure and prior to discharge Provide education on necrotic tissue and debridement process Treatment Activities: Excisional debridement : 09/23/2021 Notes: Nutrition Nursing Diagnoses: Impaired glucose control: actual or potential Goals: Patient/caregiver will maintain therapeutic glucose control Date Initiated: 09/02/2021 Target Resolution Date: 10/03/2021 Goal Status: Active Interventions: JESHURUN, OAXACA ([id number]) Assess HgA1c results as ordered upon admission and as needed Assess patient nutrition upon admission and as needed per policy Notes: Wound/Skin Impairment Nursing Diagnoses: Knowledge deficit related to ulceration/compromised skin integrity Goals: Patient/caregiver will verbalize understanding of skin care regimen Date Initiated: 09/02/2021 Target Resolution Date: 10/03/2021 Goal Status: Active Ulcer/skin breakdown will have a volume reduction of 30% by week 4 Date Initiated: 09/02/2021 Target Resolution Date: 10/03/2021 Goal Status: Active Ulcer/skin breakdown will have a volume reduction of 50% by week 8 Date Initiated: 09/02/2021 Target Resolution Date: 11/02/2021 Goal Status: Active Ulcer/skin breakdown will have a volume reduction of 80% by week 12 Date Initiated: 09/02/2021 Target Resolution Date: 12/03/2021 Goal Status: Active Ulcer/skin breakdown will heal within 14 weeks Date Initiated: 09/02/2021 Target Resolution Date: 01/03/2022 Goal Status: Active Interventions: Assess patient/caregiver ability to obtain necessary supplies Assess patient/caregiver ability to perform ulcer/skin care regimen upon admission and as needed Assess ulceration(s) every  visit Notes: Electronic Signature(s) Signed: 10/14/2021 2:40:31 PM By: Carlene Coria RN Entered By: Carlene Coria on 10/14/2021 12:47:02 [name], [name] ([id number]) -------------------------------------------------------------------------------- Pain Assessment Details Patient Name: [name] Sans. Date of Service: 10/14/2021 12:30 PM Medical Record Number: [id number] Patient Account Number: [id number] Date of Birth/Sex: [birth year] (66 y.o. M) Treating RN: Carlene Coria Primary Care Jaquisha Frech: [name] Other Clinician: Referring Breah Joa: [name] Treating Donaldson Richter/Extender: Yaakov Guthrie in Treatment: 6 Active Problems Location of Pain Severity and Description of Pain Patient Has Paino No Site Locations Pain Management and Medication Current Pain Management: Electronic Signature(s) Signed: 10/14/2021 2:40:31 PM By: Carlene Coria RN Entered By: Carlene Coria on 10/14/2021 12:42:35 [name], [name] ([id number]) -------------------------------------------------------------------------------- Patient/Caregiver Education Details Patient Name: [name] Sans. Date of Service: 10/14/2021 12:30 PM Medical Record Number: [id number] Patient Account Number: [id number] Date of Birth/Gender: 1955-08-22 (66 y.o. M) Treating RN: Carlene Coria Primary Care Physician: [name] Other Clinician: Referring Physician: [name] Treating Physician/Extender: Yaakov Guthrie in Treatment: 6 Education Assessment Education Provided To: Patient Education Topics Provided Wound Debridement: Methods: Explain/Verbal Responses: State content correctly Electronic Signature(s) Signed: 10/14/2021 2:40:31 PM By: Carlene Coria RN Entered By: Carlene Coria on 10/14/2021 13:25:07 [name], [name] ([id number]) -------------------------------------------------------------------------------- Wound Assessment Details Patient Name: [name] Goltz  P. Date of  Service: 10/14/2021 12:30 PM Medical Record Number: [id number] Patient Account Number: [id number] Date of Birth/Sex: 05-16-1955 (66 y.o. M) Treating RN: Carlene Coria Primary Care Leiland Mihelich: [name] Other Clinician: Referring Merel Santoli: [name] Treating Mario Coronado/Extender: Yaakov Guthrie in Treatment: 6 Wound Status Wound Number: 1 Primary Lymphedema Etiology: Wound Location: Right, Circumferential Lower Leg Wound Open Wounding Event: Gradually Appeared Status: Date Acquired: 08/03/2021 Comorbid Chronic Obstructive Pulmonary Disease (COPD), Weeks Of Treatment: 6 History: Arrhythmia, Congestive Heart Failure, Type II Diabetes Clustered Wound: No Photos Wound Measurements Length: (cm) 9 Width: (cm) 3.5 Depth: (cm) 0.1 Area: (cm) 24.74 Volume: (cm) 2.474 % Reduction in Area: 93.8% % Reduction in Volume: 93.8% Epithelialization: Small (1-33%) Tunneling: No Undermining: No Wound Description Classification: Full Thickness Without Exposed Support Structures Exudate Amount: Medium Exudate Type: Serosanguineous Exudate Color: red, brown Foul Odor After Cleansing: No Slough/Fibrino Yes Wound Bed Granulation Amount: Medium (34-66%) Exposed Structure Granulation Quality: Red, Pink Fascia Exposed: No Necrotic Amount: Medium (34-66%) Fat Layer (Subcutaneous Tissue) Exposed: Yes Necrotic Quality: Adherent Slough Tendon Exposed: No Muscle Exposed: No Joint Exposed: No Bone Exposed: No Treatment Notes Wound #1 (Lower Leg) Wound Laterality: Right, Circumferential Cleanser Soap and Water Discharge Instruction: Gently cleanse wound with antibacterial soap, rinse and pat dry prior to dressing wounds Peri-Wound Care DONEVAN, [name] ([id number]) Topical Santyl Collagenase Ointment, 30 (gm), tube Discharge Instruction: apply nickel thick to wound bed only Primary Dressing Hydrofera Blue Ready Transfer Foam, 2.5x2.5 (in/in) Discharge Instruction: Apply  Hydrofera Blue Ready to wound bed as directed Secondary Dressing (NON-BORDER) Zetuvit Plus Silicone NON-BORDER 5x5 (in/in) Discharge Instruction: Please do not put silicone bordered dressings under wraps. Use non-bordered dressing only under wraps. Secured With Compression Wrap 3-LAYER WRAP - Profore Lite LF 3 Multilayer Compression Bandaging System Discharge Instruction: Apply 3 multi-layer wrap as prescribed. Compression Stockings Circaid Juxta Lite Compression Wrap Quantity: 1 Right Leg Compression Amount: 20-30 mmHg Discharge Instruction: Apply Circaid Juxta Lite Compression Wrap as directed Add-Ons Electronic Signature(s) Signed: 10/14/2021 2:40:31 PM By: Carlene Coria RN Entered By: Carlene Coria on 10/14/2021 26:41:58 Kosmicki, [name] ([id number]) -------------------------------------------------------------------------------- Vitals Details Patient Name: [name] Sans. Date of Service: 10/14/2021 12:30 PM Medical Record Number: [id number] Patient Account Number: [id number] Date of Birth/Sex: November 04, 1955 (66 y.o. M) Treating RN: Carlene Coria Primary Care Elynor Kallenberger: [name] Other Clinician: Referring Jeet Shough: [name] Treating Krissia Schreier/Extender: Yaakov Guthrie in Treatment: 6 Vital Signs Time Taken: 12:42 Temperature (F): 98.2 Height (in): 71 Pulse (bpm): 74 Weight (lbs): 295 Respiratory Rate (breaths/min): 16 Body Mass Index (BMI): 41.1 Blood Pressure (mmHg): 124/73 Reference Range: 80 - 120 mg / dl Electronic Signature(s) Signed: 10/14/2021 2:40:31 PM By: Carlene Coria RN Entered By: Carlene Coria on 10/14/2021 12:42:27

## 2021-10-14 NOTE — Progress Notes (Signed)
[name], [name] ([id number]) Visit Report for 10/14/2021 Chief Complaint Document Details Patient Name: [name] [name]. Date of Service: 10/14/2021 12:30 PM Medical Record Number: [id number] Patient Account Number: [id number] Date of Birth/Sex: 1955-11-28 (66 y.o. M) Treating RN: Carlene Coria Primary Care Provider: [name] Other Clinician: Referring Provider: [name] Treating Provider/Extender: Yaakov Guthrie in Treatment: 6 Information Obtained from: Patient Chief Complaint 09/02/2021; right lower extremity wound Electronic Signature(s) Signed: 10/14/2021 2:26:37 PM By: Kalman Shan DO Entered By: Kalman Shan on 10/14/2021 14:22:42 [name], [name] ([id number]) -------------------------------------------------------------------------------- Debridement Details Patient Name: [name] [name]. Date of Service: 10/14/2021 12:30 PM Medical Record Number: [id number] Patient Account Number: [id number] Date of Birth/Sex: 01-May-1955 (66 y.o. M) Treating RN: Carlene Coria Primary Care Provider: [name] Other Clinician: Referring Provider: [name] Treating Provider/Extender: Yaakov Guthrie in Treatment: 6 Debridement Performed for Wound #1 Right,Circumferential Lower Leg Assessment: Performed By: Physician Kalman Shan, MD Debridement Type: Debridement Level of Consciousness (Pre- Awake and Alert procedure): Pre-procedure Verification/Time Out Yes - 13:15 Taken: Start Time: 13:15 Total Area Debrided (L x W): 9 (cm) x 3.5 (cm) = 31.5 (cm) Tissue and other material Viable, Non-Viable, Slough, Subcutaneous, Slough debrided: Level: Skin/Subcutaneous Tissue Debridement Description: Excisional Instrument: Curette Bleeding: Large Hemostasis Achieved: Silver Nitrate End Time: 13:24 Procedural Pain: 0 Post Procedural Pain: 0 Response to Treatment: Procedure was tolerated well Level of Consciousness  (Post- Awake and Alert procedure): Post Debridement Measurements of Total Wound Length: (cm) 9 Width: (cm) 3.5 Depth: (cm) 0.1 Volume: (cm) 2.474 Character of Wound/Ulcer Post Debridement: Improved Post Procedure Diagnosis Same as Pre-procedure Electronic Signature(s) Signed: 10/14/2021 2:26:37 PM By: Kalman Shan DO Signed: 10/14/2021 2:40:31 PM By: Carlene Coria RN Entered By: Carlene Coria on 10/14/2021 13:24:41 [name], [name] ([id number]) -------------------------------------------------------------------------------- HPI Details Patient Name: [name] [name]. Date of Service: 10/14/2021 12:30 PM Medical Record Number: [id number] Patient Account Number: [id number] Date of Birth/Sex: January 24, 1956 (66 y.o. M) Treating RN: Carlene Coria Primary Care Provider: [name] Other Clinician: Referring Provider: [name] Treating Provider/Extender: Yaakov Guthrie in Treatment: 6 History of Present Illness HPI Description: Admission 09/02/2021 Mr. [name] [name] is a 66 year old male with a past medical history of A-fib, COPD, and OSA that presents the clinic for a 1 month history of nonhealing wounds to the right lower extremity. He states he had increased swelling to his lower extremities despite being on Lasix 40 mg twice daily About 4 weeks ago. He developed blisters and these opened into wounds To both legs. He states that his Lasix was increased to 80 mg twice daily and this helped significantly with his swelling and wound healing. He currently only has wounds to the right lower extremity that are weeping. He has been keeping the areas covered. He currently denies systemic signs of infection. He denies shortness of breath, orthopnea, fever/chills, nausea/vomiting 5/17; patient presents for follow-up. He states that the day after the wrap was placed it slid down and he took it off. He has had no compression on the leg for almost a week. He states he was  busy and did not call our office to be rewrapped. Currently denies signs of infection. 5/24 ;Patient presents for follow-up. He states that the compression wrap came off 2 days after it was placed in office. He states that there was too much drainage and it went through the wrap. He has been keeping the area covered. He did not want to call and come back in to get it really wrapped.  He denies signs of infection. 5/31; patient presents for follow-up. He states that the wrap had stayed in place until about 1 hour before he came in to the clinic because he wanted to take a shower. He reports improvement to wound healing. 09-30-2021 upon evaluation today patient appears to be doing well currently in regard to his wound in fact he is making excellent progress is measuring much smaller. Fortunately I do not see any evidence of active infection at this time and though he does not keep the wrap on the whole time it seems that it starts to unravel as he probably has restless leg syndrome and once the outer portion comes off then the Coban gets stuck in his sheets and eventually it just pulls down. Nonetheless I do believe that we might be able to try the pantyhose over instead of the cloth to see if this will do better as far as staying in place and preventing it from coming loose. 6/14; patient presents for follow-up. He tolerated the compression wrap well however took it off yesterday so he can take a shower. He has been keeping the area covered. He denies signs of infection. He has compression stockings but states these are difficult to put on. He does not own a juxta light but would like [place] to order one. 6/21; patient presents for follow-up. He states he took the compression wrap off today. He has no issues or complaints. He denies signs of infection. Electronic Signature(s) Signed: 10/14/2021 2:26:37 PM By: Kalman Shan DO Entered By: Kalman Shan on 10/14/2021 14:23:03 [name], [name]  ([id number]) -------------------------------------------------------------------------------- Physical Exam Details Patient Name: [name] [name]. Date of Service: 10/14/2021 12:30 PM Medical Record Number: [id number] Patient Account Number: [id number] Date of Birth/Sex: [birth year] (66 y.o. M) Treating RN: Carlene Coria Primary Care Provider: [name] Other Clinician: Referring Provider: [name] Treating Provider/Extender: Yaakov Guthrie in Treatment: 6 Constitutional . Cardiovascular . Psychiatric . Notes Right lower extremity: To the posterior aspect there is an open wound with nonviable tissue and granulation tissue. Venous stasis dermatitis. 2+ pitting edema to the knee. Electronic Signature(s) Signed: 10/14/2021 2:26:37 PM By: Kalman Shan DO Entered By: Kalman Shan on 10/14/2021 14:23:43 Preble, [name] ([id number]) -------------------------------------------------------------------------------- Physician Orders Details Patient Name: [name] [name]. Date of Service: 10/14/2021 12:30 PM Medical Record Number: [id number] Patient Account Number: [id number] Date of Birth/Sex: 02/17/1956 (66 y.o. M) Treating RN: Carlene Coria Primary Care Provider: [name] Other Clinician: Referring Provider: [name] Treating Provider/Extender: Yaakov Guthrie in Treatment: 6 Verbal / Phone Orders: No Diagnosis Coding Follow-up Appointments o Return Appointment in 1 week. Bathing/ Shower/ Hygiene o May shower with wound dressing protected with water repellent cover or cast protector. Edema Control - Lymphedema / Segmental Compressive Device / Other o Elevate, Exercise Daily and Avoid Standing for Long Periods of Time. o Elevate legs to the level of the heart and pump ankles as often as possible o Elevate leg(s) parallel to the floor when sitting. Wound Treatment Wound #1 - Lower Leg Wound Laterality: Right,  Circumferential Cleanser: Soap and Water 1 x Per Week/30 Days Discharge Instructions: Gently cleanse wound with antibacterial soap, rinse and pat dry prior to dressing wounds Topical: Santyl Collagenase Ointment, 30 (gm), tube 1 x Per Week/30 Days Discharge Instructions: apply nickel thick to wound bed only Primary Dressing: Hydrofera Blue Ready Transfer Foam, 2.5x2.5 (in/in) 1 x Per Week/30 Days Discharge Instructions: Apply Hydrofera Blue Ready to wound bed as directed Secondary Dressing: (NON-BORDER)  Zetuvit Plus Silicone NON-BORDER 5x5 (in/in) 1 x Per Week/30 Days Discharge Instructions: Please do not put silicone bordered dressings under wraps. Use non-bordered dressing only under wraps. Compression Wrap: 3-LAYER WRAP - Profore Lite LF 3 Multilayer Compression Bandaging System 1 x Per Week/30 Days Discharge Instructions: Apply 3 multi-layer wrap as prescribed. Compression Stockings: Circaid Juxta Lite Compression Wrap Right Leg Compression Amount: 20-30 mmHG Discharge Instructions: Apply Circaid Juxta Lite Compression Wrap as directed Electronic Signature(s) Signed: 10/14/2021 2:26:37 PM By: Kalman Shan DO Entered By: Kalman Shan on 10/14/2021 14:25:56 Pattison, [name] ([id number]) -------------------------------------------------------------------------------- Problem List Details Patient Name: [name] [name]. Date of Service: 10/14/2021 12:30 PM Medical Record Number: [id number] Patient Account Number: [id number] Date of Birth/Sex: Mar 12, 1956 (66 y.o. 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/02/2021 No Yes exposed I87.321 Chronic venous hypertension  (idiopathic) with inflammation of right 09/02/2021 No Yes lower extremity I89.0 Lymphedema, not elsewhere classified 09/02/2021 No Yes E11.622 Type 2 diabetes mellitus with other skin ulcer 09/02/2021 No Yes J44.9 Chronic obstructive pulmonary disease, unspecified 09/02/2021 No Yes I48.91 Unspecified atrial fibrillation 09/02/2021 No Yes Z79.01 Long term (current) use of anticoagulants 09/02/2021 No Yes Inactive Problems Resolved Problems Electronic Signature(s) Signed: 10/14/2021 2:26:37 PM By: Kalman Shan DO Entered By: Kalman Shan on 10/14/2021 14:22:38 Hirschi, [name] ([id number]) -------------------------------------------------------------------------------- Progress Note Details Patient Name: [name] [name]. Date of Service: 10/14/2021 12:30 PM Medical Record Number: [id number] Patient Account Number: [id number] Date of Birth/Sex: [birth year] (66 y.o. M) Treating RN: Carlene Coria Primary Care Provider: [name] Other Clinician: Referring Provider: [name] Treating Provider/Extender: Yaakov Guthrie in Treatment: 6 Subjective Chief Complaint Information obtained from Patient 09/02/2021; right lower extremity wound History of Present Illness (HPI) Admission 09/02/2021 Mr. Tadao Emig is a 66 year old male with a past medical history of A-fib, COPD, and OSA that presents the clinic for a 1 month history of nonhealing wounds to the right lower extremity. He states he had increased swelling to his lower extremities despite being on Lasix 40 mg twice daily About 4 weeks ago. He developed blisters and these opened into wounds To both legs. He states that his Lasix was increased to 80 mg twice daily and this helped significantly with his swelling and wound healing. He currently only has wounds to the right lower extremity that are weeping. He has been keeping the areas covered. He currently denies systemic signs of infection. He denies shortness of  breath, orthopnea, fever/chills, nausea/vomiting 5/17; patient presents for follow-up. He states that the day after the wrap was placed it slid down and he took it off. He has had no compression on the leg for almost a week. He states he was busy and did not call our office to be rewrapped. Currently denies signs of infection. 5/24 ;Patient presents for follow-up. He states that the compression wrap came off 2 days after it was placed in office. He states that there was too much drainage and it went through the wrap. He has been keeping the area covered. He did not want to call and come back in to get it really wrapped. He denies signs of infection. 5/31; patient presents for follow-up. He states that the wrap had stayed in place until about 1 hour before he came in  to the clinic because he wanted to take a shower. He reports improvement to wound healing. 09-30-2021 upon evaluation today patient appears to be doing well currently in regard to his wound in fact he is making excellent progress is measuring much smaller. Fortunately I do not see any evidence of active infection at this time and though he does not keep the wrap on the whole time it seems that it starts to unravel as he probably has restless leg syndrome and once the outer portion comes off then the Coban gets stuck in his sheets and eventually it just pulls down. Nonetheless I do believe that we might be able to try the pantyhose over instead of the cloth to see if this will do better as far as staying in place and preventing it from coming loose. 6/14; patient presents for follow-up. He tolerated the compression wrap well however took it off yesterday so he can take a shower. He has been keeping the area covered. He denies signs of infection. He has compression stockings but states these are difficult to put on. He does not own a juxta light but would like [place] to order one. 6/21; patient presents for follow-up. He states he took the  compression wrap off today. He has no issues or complaints. He denies signs of infection. Objective Constitutional Vitals Time Taken: 12:42 PM, Height: 71 in, Weight: 295 lbs, BMI: 41.1, Temperature: 98.2 F, Pulse: 74 bpm, Respiratory Rate: 16 breaths/min, Blood Pressure: 124/73 mmHg. General Notes: Right lower extremity: To the posterior aspect there is an open wound with nonviable tissue and granulation tissue. Venous stasis dermatitis. 2+ pitting edema to the knee. Integumentary (Hair, Skin) Wound #1 status is Open. Original cause of wound was Gradually Appeared. The date acquired was: 08/03/2021. The wound has been in treatment 6 weeks. The wound is located on the Right,Circumferential Lower Leg. The wound measures 9cm length x 3.5cm width x 0.1cm depth; 24.74cm^2 area and 2.474cm^3 volume. There is Fat Layer (Subcutaneous Tissue) exposed. There is no tunneling or undermining noted. There is a medium amount of serosanguineous drainage noted. There is medium (34-66%) red, pink granulation within the wound bed. There is a medium (34-66%) amount of necrotic tissue within the wound bed including Adherent Slough. [name], [name] ([id number]) Assessment Active Problems ICD-10 Non-pressure chronic ulcer of other part of right lower leg with fat layer exposed Chronic venous hypertension (idiopathic) with inflammation of right lower extremity Lymphedema, not elsewhere classified Type 2 diabetes mellitus with other skin ulcer Chronic obstructive pulmonary disease, unspecified Unspecified atrial fibrillation Long term (current) use of anticoagulants Patient's right lower extremity wound is stable. I debrided nonviable tissue. I recommended switching to Hydrofera Blue to help with further debridement. We will continue compression therapy. Follow-up in 1 week Procedures Wound #1 Pre-procedure diagnosis of Wound #1 is a Lymphedema located on the Right,Circumferential Lower Leg . There was a  Excisional Skin/Subcutaneous Tissue Debridement with a total area of 31.5 sq cm performed by Kalman Shan, MD. With the following instrument(s): Curette to remove Viable and Non-Viable tissue/material. Material removed includes Subcutaneous Tissue and Slough and. No specimens were taken. A time out was conducted at 13:15, prior to the start of the procedure. A Large amount of bleeding was controlled with Silver Nitrate. The procedure was tolerated well with a pain level of 0 throughout and a pain level of 0 following the procedure. Post Debridement Measurements: 9cm length x 3.5cm width x 0.1cm depth; 2.474cm^3 volume. Character of Wound/Ulcer Post  Debridement is improved. Post procedure Diagnosis Wound #1: Same as Pre-Procedure Plan Follow-up Appointments: Return Appointment in 1 week. Bathing/ Shower/ Hygiene: May shower with wound dressing protected with water repellent cover or cast protector. Edema Control - Lymphedema / Segmental Compressive Device / Other: Elevate, Exercise Daily and Avoid Standing for Long Periods of Time. Elevate legs to the level of the heart and pump ankles as often as possible Elevate leg(s) parallel to the floor when sitting. WOUND #1: - Lower Leg Wound Laterality: Right, Circumferential Cleanser: Soap and Water 1 x Per Week/30 Days Discharge Instructions: Gently cleanse wound with antibacterial soap, rinse and pat dry prior to dressing wounds Topical: Santyl Collagenase Ointment, 30 (gm), tube 1 x Per Week/30 Days Discharge Instructions: apply nickel thick to wound bed only Primary Dressing: Hydrofera Blue Ready Transfer Foam, 2.5x2.5 (in/in) 1 x Per Week/30 Days Discharge Instructions: Apply Hydrofera Blue Ready to wound bed as directed Secondary Dressing: (NON-BORDER) Zetuvit Plus Silicone NON-BORDER 5x5 (in/in) 1 x Per Week/30 Days Discharge Instructions: Please do not put silicone bordered dressings under wraps. Use non-bordered dressing only under  wraps. Compression Wrap: 3-LAYER WRAP - Profore Lite LF 3 Multilayer Compression Bandaging System 1 x Per Week/30 Days Discharge Instructions: Apply 3 multi-layer wrap as prescribed. Compression Stockings: Circaid Juxta Lite Compression Wrap Compression Amount: 20-30 mmHg (right) Discharge Instructions: Apply Circaid Juxta Lite Compression Wrap as directed 1. In office sharp debridement 2. Hydrofera Blue under 3 layer compression 3. Follow-up in 1 week [name], [name] ([id number]) Electronic Signature(s) Signed: 10/14/2021 2:26:37 PM By: Kalman Shan DO Entered By: Kalman Shan on 10/14/2021 14:24:51 Fragoso, [name] ([id number]) -------------------------------------------------------------------------------- SuperBill Details Patient Name: [name] [name]. Date of Service: 10/14/2021 Medical Record Number: [id number] Patient Account Number: [id number] Date of Birth/Sex: 10-08-1955 (66 y.o. M) Treating RN: Carlene Coria Primary Care Provider: [name] Other Clinician: Referring Provider: [name] Treating Provider/Extender: Yaakov Guthrie in Treatment: 6 Diagnosis Coding ICD-10 Codes Code Description [phone] Non-pressure chronic ulcer of other part of right lower leg with fat layer exposed I87.321 Chronic venous hypertension (idiopathic) with inflammation of right lower extremity I89.0 Lymphedema, not elsewhere classified E11.622 Type 2 diabetes mellitus with other skin ulcer J44.9 Chronic obstructive pulmonary disease, unspecified I48.91 Unspecified atrial fibrillation Z79.01 Long term (current) use of anticoagulants Facility Procedures CPT4 Code: 17616073 Description: 11042 - DEB SUBQ TISSUE 20 SQ CM/< Modifier: Quantity: 1 CPT4 Code: Description: ICD-10 Diagnosis Description L97.812 Non-pressure chronic ulcer of other part of right lower leg with fat la I87.321 Chronic venous hypertension (idiopathic) with inflammation of right  low Modifier: yer exposed er extremity Quantity: CPT4 Code: [id number] Description: 11045 - DEB SUBQ TISS EA ADDL 20CM Modifier: Quantity: 1 CPT4 Code: Description: ICD-10 Diagnosis Description L97.812 Non-pressure chronic ulcer of other part of right lower leg with fat la I87.321 Chronic venous hypertension (idiopathic) with inflammation of right low Modifier: yer exposed er extremity Quantity: Physician Procedures CPT4 Code: 8546270 Description: 11042 - WC PHYS SUBQ TISS 20 SQ CM Modifier: Quantity: 1 CPT4 Code: Description: ICD-10 Diagnosis Description L97.812 Non-pressure chronic ulcer of other part of right lower leg with fat lay I87.321 Chronic venous hypertension (idiopathic) with inflammation of right lowe Modifier: er exposed r extremity Quantity: CPT4 Code: [id number] Description: 11045 - WC PHYS SUBQ TISS EA ADDL 20 CM Modifier: Quantity: 1 CPT4 Code: Description: ICD-10 Diagnosis Description L97.812 Non-pressure chronic ulcer of other part of right lower leg with fat lay I87.321 Chronic venous hypertension (idiopathic) with inflammation of right lowe  Modifier: er exposed r extremity Quantity: Electronic Signature(s) Signed: 10/14/2021 2:26:37 PM By: Kalman Shan DO Entered By: Kalman Shan on 10/14/2021 14:25:21

## 2021-10-15 ENCOUNTER — Ambulatory Visit (INDEPENDENT_AMBULATORY_CARE_PROVIDER_SITE_OTHER): Payer: Medicare Other

## 2021-10-15 ENCOUNTER — Telehealth: Payer: Self-pay

## 2021-10-15 DIAGNOSIS — Z7901 Long term (current) use of anticoagulants: Secondary | ICD-10-CM | POA: Diagnosis not present

## 2021-10-15 LAB — POCT INR: INR: 3.6 — AB (ref 2.0–3.0)

## 2021-10-15 NOTE — Progress Notes (Signed)
Chronic Care Management Pharmacy Assistant   Name: [name]  MRN: [id number] DOB: 1955-06-18  Reason for Encounter: CCM (Appointment Reminder)    Recent office visits:  09/24/21 AWV 09/17/21 [name], MD Telephone Start: Warfarin 5 mg daily  09/08/21 [name], MD Panic Attacks Change: Furosemide: 80 mg vs. 40 mg.  08/26/21 [name], MD Abdominal Pain Abnormal Labs: "Please notify kidneys returned normal, liver also normal.  Sugar was high and carbon dioxide levels remain elevated in the blood.  His blood counts show progressive anemia hopefully his recent iron infusion will help with this"  Concerned for bowel obstruction. DG Xray 08/17/21 [name], MD Leg Ulcer  Stop (completed) Cyclobenzaprine 10 mg. No other changes. FU 2 months 08/10/21 [name], MD Leg Ulcer Change (strength): Furosemide 80 mg Change: Metolazone 2.5 BID Unna boot replacement Referral to wound care and referral to hematology/oncology.  08/04/21 [name], MD Leg Ulcer Abnormal Labs: No provider notes. Start: Cephalexin 500 mg. Stop (cost) Farxiga 10 mg Stop: (Change): Dronedarone 400 mg. Unna boot placement. FU 1 week 04/03/2021 - [name], MD - Patient presented for shortness of breath. Labs: BMP, CBC, A1c and Brain Natriuretic Peptide.    Recent consult visits:  10/08/21 Radiology - Anemia procedure - no other information 10/05/21 Capsule Endoscopy 06/070//23 Jeri Cos, PA (Wound Care): Wound cleanse and compression wrap. 09/29/21 Denice Paradise, NP Anemia Scheduled: Capsule Endoscopy on 10/05/21 09/23/21 Kalman Shan, DO (Wound Care): Wound cleanse and compression wrap. 09/16/21 Lars Mage, MD (Cardiology): Afib EKG and Echo Change: Amiodarone 200 mg daily vs BID Stop (non compliant): Apixaban 5 mg 09/16/21 Kalman Shan, DO (Wound Care): Wound cleanse and compression wrap. 09/09/21 Kalman Shan, DO (Wound Care): Wound cleanse and  compression wrap.  09/04/21 Low dose CT - former smoker 09/02/2021 Iron Infusion Administered: FERUMOXYTOL Shirlean Kelly) Q7D  09/02/21 Kalman Shan, DO (Wound Care): Perfomed: Excisional Skin/Subcutaneous Tissue Debridement FU 1 week 08/31/21 CT Abdomen Pelvis 08/26/2021 Iron Infusion Administered: FERUMOXYTOL Shirlean Kelly) Q7D  08/14/21 Randa Evens, MD (Oncology) Anemia Stop (completed): Cephalexin 500 mg. Recommend 2 doses of Feraheme 510 mg IV weekly. Referral lung cancer screening 07/29/21 Jettie Booze, PA (Cardiology) Afib Start: Amiodarone 200 mg. Proceed to electrical cardioversion of Afib. Referral to electrophysiology.  05/12/2021 - Cardiology - Patient presented for Atrial Fibrillation and Flutter. Procedure: EKG. Discontinued (no reason given): Albuterol 2.5 mg, Isosorbide Mononitrate 20 mg ER tablet. Change: Cyanocobalamin 1000 mcg tablet vs. 1000 mcg SL tablet.    Hospital visits:  08/12/2021 [facility] - Discharged same day Cardioversion Patient found to be in NSR and procedure aborted   Medications: Outpatient Encounter Medications as of 10/15/2021  Medication Sig Note   ACCU-CHEK FASTCLIX LANCETS MISC Check blood sugar once daily and as instructed. Dx 250.00    albuterol (PROVENTIL) (2.5 MG/3ML) 0.083% nebulizer solution INHALE 3 ML BY NEBULIZATION EVERY 6 HOURS AS NEEDED FOR WHEEZING OR SHORTNESS OF BREATH Strength (2.5 MG/3ML) 0.083%    albuterol (VENTOLIN HFA) 108 (90 Base) MCG/ACT inhaler INHALE 2 PUFFS BY MOUTH EVERY 6 HOURS AS NEEDED FOR WHEEZE OR SHORTNESS OF BREATH    amiodarone (PACERONE) 200 MG tablet Take 1 tablet (200 mg total) by mouth daily.    Blood Glucose Monitoring Suppl (BLOOD GLUCOSE MONITOR SYSTEM) W/DEVICE KIT by Does not apply route. Use to check sugar once daily and as needed Dx: E11.9 **ONE TOUCH VERIO**    carvedilol (COREG) 6.25 MG tablet TAKE 1 TABLET BY MOUTH 2 TIMES DAILY WITH  A MEAL.    Cholecalciferol (VITAMIN D) 50 MCG (2000 UT) CAPS Take 1  capsule (2,000 Units total) by mouth daily. (Patient taking differently: Take 2,000 Units by mouth daily.)    citalopram (CELEXA) 10 MG tablet Take 1 tablet (10 mg total) by mouth daily.    Docusate Calcium (STOOL SOFTENER PO) Take 1 tablet by mouth daily.    ferrous sulfate 325 (65 FE) MG tablet Take 1 tablet (325 mg total) by mouth daily with breakfast. (Patient not taking: Reported on 09/24/2021) 08/10/2021: On hold   fluticasone (FLONASE) 50 MCG/ACT nasal spray SPRAY 2 SPRAYS INTO EACH NOSTRIL EVERY DAY (Patient taking differently: Place 1 spray into both nostrils daily as needed for allergies.)    fluticasone-salmeterol (WIXELA INHUB) 100-50 MCG/ACT AEPB Inhale 1 puff into the lungs 2 (two) times daily.    furosemide (LASIX) 80 MG tablet Take 1 tablet (80 mg total) by mouth 2 (two) times daily.    glimepiride (AMARYL) 4 MG tablet TAKE 1 TABLET BY MOUTH EVERY DAY WITH BREAKFAST    hydrOXYzine (ATARAX) 50 MG tablet TAKE 1 TABLET (50 MG TOTAL) BY MOUTH 2 (TWO) TIMES DAILY AS NEEDED FOR ANXIETY.    ipratropium (ATROVENT HFA) 17 MCG/ACT inhaler Inhale 2 puffs into the lungs every 6 (six) hours as needed for wheezing.    losartan (COZAAR) 25 MG tablet TAKE 1 TABLET (25 MG TOTAL) BY MOUTH DAILY. (Patient not taking: Reported on 09/24/2021)    metFORMIN (GLUCOPHAGE) 1000 MG tablet TAKE 1 TABLET (1,000 MG TOTAL) BY MOUTH 2 (TWO) TIMES DAILY WITH A MEAL.    metolazone (ZAROXOLYN) 2.5 MG tablet Take 1 tablet (2.5 mg total) by mouth 2 (two) times a week.    montelukast (SINGULAIR) 10 MG tablet TAKE 1 TABLET BY MOUTH EVERYDAY AT BEDTIME    NON FORMULARY 1,200 mg daily. SEA Blayton Huttner (Patient not taking: Reported on 09/24/2021)    NON FORMULARY 2,000 mg daily. CINAMMON (Patient not taking: Reported on 09/24/2021)    NON FORMULARY daily. SUPER BEETS    NON FORMULARY 500 mg once. TURMERIC    ONETOUCH VERIO test strip USE AS INSTRUCTED TO CHECK THREE TIMES DAILY AND AS NEEDED. E11.8    pantoprazole (PROTONIX) 40 MG tablet  TAKE 1 TABLET (40 MG TOTAL) BY MOUTH 2 (TWO) TIMES DAILY BEFORE A MEAL.    Probiotic Product (PROBIOTIC DAILY PO) Take 1 tablet by mouth daily.    simvastatin (ZOCOR) 20 MG tablet TAKE 1 TABLET BY MOUTH EVERY DAY IN THE EVENING    triamcinolone cream (KENALOG) 0.1 % Apply 1 application. topically 2 (two) times daily. Apply to AA. (Patient taking differently: Apply 1 application. topically 3 (three) times daily. Apply to AA.)    umeclidinium bromide (INCRUSE ELLIPTA) 62.5 MCG/ACT AEPB Inhale 1 puff into the lungs daily.    vitamin B-12 (CYANOCOBALAMIN) 1000 MCG tablet Take 1 tablet (1,000 mcg total) by mouth every Monday, Wednesday, and Friday. (Patient taking differently: Take 1,000 mcg by mouth daily.)    warfarin (COUMADIN) 5 MG tablet TAKE 1 TABLET BY MOUTH DAILY OR AS DIRECTED BY ANTICOAGULATION CLINIC    No facility-administered encounter medications on file as of 10/15/2021.   [name] Orsborn was contacted to remind of upcoming telephone visit with [name] on 10/20/21 at 11:00. Patient was reminded to have any blood glucose and blood pressure readings available for review at appointment.   Message was left reminding patient of appointment.  CCM referral has been placed prior to visit?  Yes   Star Rating Drugs: Medication:  Last Fill: Day Supply Losartan 25 mg Patient not taking reported on 09/24/2021 Metformin 1000 mg 07/20/2021 90 Simvastatin 20 mg 09/28/2021 [place], CPP notified  Marijean Niemann, [facility] Assistant [phone]

## 2021-10-15 NOTE — Patient Instructions (Addendum)
Pre visit review using our clinic review tool, if applicable. No additional management support is needed unless otherwise documented below in the visit note.  Hold dose today and then change weekly dose to take 1/2 tablet daily except take 1 tablet on Mondays and Thursdays. Recheck in 2 week.

## 2021-10-16 ENCOUNTER — Encounter: Payer: Self-pay | Admitting: Oncology

## 2021-10-16 ENCOUNTER — Inpatient Hospital Stay: Payer: Medicare Other | Attending: Oncology

## 2021-10-16 ENCOUNTER — Inpatient Hospital Stay: Payer: Medicare Other | Admitting: Oncology

## 2021-10-16 VITALS — BP 127/79 | HR 69 | Temp 98.1°F | Resp 16 | Ht 71.0 in | Wt 296.0 lb

## 2021-10-16 DIAGNOSIS — I5022 Chronic systolic (congestive) heart failure: Secondary | ICD-10-CM | POA: Diagnosis not present

## 2021-10-16 DIAGNOSIS — J449 Chronic obstructive pulmonary disease, unspecified: Secondary | ICD-10-CM | POA: Insufficient documentation

## 2021-10-16 DIAGNOSIS — E785 Hyperlipidemia, unspecified: Secondary | ICD-10-CM | POA: Diagnosis not present

## 2021-10-16 DIAGNOSIS — D509 Iron deficiency anemia, unspecified: Secondary | ICD-10-CM | POA: Insufficient documentation

## 2021-10-16 DIAGNOSIS — I420 Dilated cardiomyopathy: Secondary | ICD-10-CM | POA: Diagnosis not present

## 2021-10-16 DIAGNOSIS — Z833 Family history of diabetes mellitus: Secondary | ICD-10-CM | POA: Insufficient documentation

## 2021-10-16 DIAGNOSIS — K760 Fatty (change of) liver, not elsewhere classified: Secondary | ICD-10-CM | POA: Insufficient documentation

## 2021-10-16 DIAGNOSIS — Z8249 Family history of ischemic heart disease and other diseases of the circulatory system: Secondary | ICD-10-CM | POA: Insufficient documentation

## 2021-10-16 DIAGNOSIS — I2510 Atherosclerotic heart disease of native coronary artery without angina pectoris: Secondary | ICD-10-CM | POA: Insufficient documentation

## 2021-10-16 DIAGNOSIS — Z801 Family history of malignant neoplasm of trachea, bronchus and lung: Secondary | ICD-10-CM | POA: Diagnosis not present

## 2021-10-16 DIAGNOSIS — F1290 Cannabis use, unspecified, uncomplicated: Secondary | ICD-10-CM | POA: Diagnosis not present

## 2021-10-16 DIAGNOSIS — Z87891 Personal history of nicotine dependence: Secondary | ICD-10-CM | POA: Diagnosis not present

## 2021-10-16 DIAGNOSIS — R5383 Other fatigue: Secondary | ICD-10-CM | POA: Insufficient documentation

## 2021-10-16 DIAGNOSIS — G4733 Obstructive sleep apnea (adult) (pediatric): Secondary | ICD-10-CM | POA: Insufficient documentation

## 2021-10-16 DIAGNOSIS — Z7901 Long term (current) use of anticoagulants: Secondary | ICD-10-CM | POA: Diagnosis not present

## 2021-10-16 DIAGNOSIS — E119 Type 2 diabetes mellitus without complications: Secondary | ICD-10-CM | POA: Insufficient documentation

## 2021-10-16 DIAGNOSIS — D508 Other iron deficiency anemias: Secondary | ICD-10-CM

## 2021-10-16 DIAGNOSIS — M47812 Spondylosis without myelopathy or radiculopathy, cervical region: Secondary | ICD-10-CM | POA: Diagnosis not present

## 2021-10-16 DIAGNOSIS — Z808 Family history of malignant neoplasm of other organs or systems: Secondary | ICD-10-CM | POA: Insufficient documentation

## 2021-10-16 DIAGNOSIS — Z79899 Other long term (current) drug therapy: Secondary | ICD-10-CM | POA: Insufficient documentation

## 2021-10-16 LAB — CBC WITH DIFFERENTIAL/PLATELET
Abs Immature Granulocytes: 0.04 10*3/uL (ref 0.00–0.07)
Basophils Absolute: 0.1 10*3/uL (ref 0.0–0.1)
Basophils Relative: 1 %
Eosinophils Absolute: 0.1 10*3/uL (ref 0.0–0.5)
Eosinophils Relative: 1 %
HCT: 35.9 % — ABNORMAL LOW (ref 39.0–52.0)
Hemoglobin: 10.1 g/dL — ABNORMAL LOW (ref 13.0–17.0)
Immature Granulocytes: 0 %
Lymphocytes Relative: 13 %
Lymphs Abs: 1.4 10*3/uL (ref 0.7–4.0)
MCH: 22.2 pg — ABNORMAL LOW (ref 26.0–34.0)
MCHC: 28.1 g/dL — ABNORMAL LOW (ref 30.0–36.0)
MCV: 78.9 fL — ABNORMAL LOW (ref 80.0–100.0)
Monocytes Absolute: 1.2 10*3/uL — ABNORMAL HIGH (ref 0.1–1.0)
Monocytes Relative: 11 %
Neutro Abs: 7.9 10*3/uL — ABNORMAL HIGH (ref 1.7–7.7)
Neutrophils Relative %: 74 %
Platelets: 192 10*3/uL (ref 150–400)
RBC: 4.55 MIL/uL (ref 4.22–5.81)
RDW: 21.4 % — ABNORMAL HIGH (ref 11.5–15.5)
WBC: 10.7 10*3/uL — ABNORMAL HIGH (ref 4.0–10.5)
nRBC: 0 % (ref 0.0–0.2)

## 2021-10-16 LAB — IRON AND TIBC
Iron: 22 ug/dL — ABNORMAL LOW (ref 45–182)
Saturation Ratios: 5 % — ABNORMAL LOW (ref 17.9–39.5)
TIBC: 447 ug/dL (ref 250–450)
UIBC: 425 ug/dL

## 2021-10-16 LAB — FERRITIN: Ferritin: 10 ng/mL — ABNORMAL LOW (ref 24–336)

## 2021-10-18 ENCOUNTER — Encounter: Payer: Self-pay | Admitting: Oncology

## 2021-10-19 ENCOUNTER — Other Ambulatory Visit: Payer: Medicare Other

## 2021-10-20 ENCOUNTER — Telehealth: Payer: Self-pay | Admitting: Pharmacist

## 2021-10-20 ENCOUNTER — Ambulatory Visit (INDEPENDENT_AMBULATORY_CARE_PROVIDER_SITE_OTHER): Payer: Medicare Other | Admitting: Pharmacist

## 2021-10-20 DIAGNOSIS — E1169 Type 2 diabetes mellitus with other specified complication: Secondary | ICD-10-CM

## 2021-10-20 DIAGNOSIS — I5022 Chronic systolic (congestive) heart failure: Secondary | ICD-10-CM

## 2021-10-20 DIAGNOSIS — J449 Chronic obstructive pulmonary disease, unspecified: Secondary | ICD-10-CM

## 2021-10-20 DIAGNOSIS — I4891 Unspecified atrial fibrillation: Secondary | ICD-10-CM

## 2021-10-20 DIAGNOSIS — G4733 Obstructive sleep apnea (adult) (pediatric): Secondary | ICD-10-CM

## 2021-10-20 NOTE — Progress Notes (Signed)
Chronic Care Management Pharmacy Note  10/23/2021 Name:  [name] MRN:  [id number] DOB:  [birth year]  Summary: CCM F/U visit -Reviewed medications; pt affirms compliance -COPD: Pt struggles with cost of inhalers (Incruse, Wixela); he would benefit from consolidating inhalers to 1 triple-therapy inhaler and enrolling in PAP -DM: A1c 10.4% (06/2021); pt reports he has focused on improving diet and fasting BG is now in 150-210 range; he could not afford [place] previously -Insomnia/OSA: unable to tolerate CPAP previously but pt has not tried new masks in a few years; he would like to revisit this and other options  Recommendations/Changes made from today's visit: -Switch to [place] (from [place], [place]) - enrolled in AZ&Me for free inhaler -Hold off on restarting Farxiga while A1c is > 10% (greater risk of side effects); if upcoming A1c is < 10% it would be reasonable to restart Farxiga (AZ&Me) -Recommend follow up with pulmonology re: OSA options; will determine if new referral is needed   Plan: -Pharmacist follow up televisit scheduled for 1 months -PCP F/U 11/02/21    Subjective: [name] is an 66 y.o. year old male who is a primary patient of [name] Bush, MD.  The CCM team was consulted for assistance with disease management and care coordination needs.    Engaged with patient by telephone for follow up visit in response to provider referral for pharmacy case management and/or care coordination services.   Consent to Services:  The patient was given information about Chronic Care Management services, agreed to services, and gave verbal consent prior to initiation of services.  Please see initial visit note for detailed documentation.   Patient Care Team: [name] Bush, MD as PCP - General (Family Medicine) Vickie Epley, MD as PCP - Electrophysiology (Cardiology) [name] Grayer, MD as Attending Physician (Cardiology) Corey Skains, MD as  Referring Physician (Internal Medicine) [name], [facility] as Pharmacist (Pharmacist)  Recent office visits: 10/15/21 Anticoag: INR 3.6. Hold warfarin x 1 day. Reduce dose to 1/2 tab daily except 1 tab M/Th. 10/08/21 Anticoag: INR 3.7. Hold warfarin x 1 day then reduce dose to 1 tab daily except 1/2 tab M/Th/Sa. 10/01/21 Anticoag:INR 5.5. Hold warfarin x 2 days then reduce dose to 1 tab daily except 1/2 tab M/Th.  09/24/21 AWV 09/17/21 Telephone:  Start: Warfarin 5 mg daily - establish with [facility] for coumadin clinic. 09/08/21 [name] Bush, MD OV: Panic Attacks (worse d/t running out of metolazone and associated dypsnea); Increase furosemide to 80 mg BID. Restart Metolazone 2.5 mg twice weekly. Limit hydroxyzine 50 mg to 1 at a time. Avoid Benzo given h/o MJ use and COPD. Refer to [facility] GI for IDA (endoscopy).  08/26/21 [name] Bush, MD OV:  Abdominal Pain Abnormal Labs: "Please notify kidneys returned normal, liver also normal.  Sugar was high and carbon dioxide levels remain elevated in the blood.  His blood counts show progressive anemia hopefully his recent iron infusion will help with this"  Concerned for bowel obstruction. DG Xray - WNL.  08/17/21 [name] Bush, MD OV: Leg Ulcer (wound check - chronic venous insufficiency)- Stop (completed) Cyclobenzaprine 10 mg. No other changes. FU 2 months  08/10/21 [name] Bush, MD OV: Leg Ulcer (wound check): Persistent edema. Increase metolazone to 2.5 mg twice a week. Refer to heme/onc for IDA. Refer to wound clinic.  08/04/21 [name] Bush, MD OV: f/u - Apply for Farxiga PAP.  04/03/2021 - [name] Bush, MD - Patient presented for shortness of breath. Labs: BMP, CBC, A1c  and Brain Natriuretic Peptide.   Recent consult visits: 10/16/21 Dr Janese [name] (heme/onco): f/u IDA. Iron still low s/p 2 doses of ferahme. Ordered 2 more doses of feraheme. Consider capsule endoscopy. Repeat CBC/iron 3-6 mos.  10/08/21 Radiology - Anemia  procedure - no other information 10/05/21 Capsule Endoscopy  09/29/21 NP Denice Paradise (GI): initial consult - IDA. EGD/colonoscopy ruled out. Ddx to include small bowel tumors or AVM. Rec capsule endoscopy.   09/16/21 [name] Mage, MD (Cardiology): Afib EKG and Echo Decrease: Amiodarone 200 mg daily vs BID; Stop Eliquis (cost); Start Warfarin - pt requests PCP monitoring.  09/02/2021 Iron Infusion Administered: FERUMOXYTOL [name]) Q7D  08/31/21 CT Abdomen Pelvis 08/26/2021 Iron Infusion Administered: FERUMOXYTOL [name]) Q7D   08/14/21 [name] Evens, MD (Oncology) Anemia Stop (completed): Cephalexin 500 mg. Recommend 2 doses of Feraheme 510 mg IV weekly. Referral lung cancer screening  07/29/21 [name] Booze, PA (Cardiology) Afib Start: Amiodarone 200 mg. Proceed to electrical cardioversion of Afib. Referral to electrophysiology.   05/12/2021 - Cardiology - Patient presented for Atrial Fibrillation and Flutter. Procedure: EKG. Discontinued (no reason given): Albuterol 2.5 mg, Isosorbide Mononitrate 20 mg ER tablet. Change: Cyanocobalamin 1000 mcg tablet vs. 1000 mcg SL tablet.   Hospital visits: 08/12/21 Admission for Cardioversion   Objective:  Lab Results  Component Value Date   CREATININE 0.78 08/26/2021   BUN 16 08/26/2021   GFR 93.29 08/26/2021   GFRNONAA >60 08/05/2020   GFRAA >60 12/22/2018   NA 136 08/26/2021   K 4.2 08/26/2021   CALCIUM 9.9 08/26/2021   CO2 40 (H) 08/26/2021   GLUCOSE 216 (H) 08/26/2021    Lab Results  Component Value Date/Time   HGBA1C 10.4 (H) 07/15/2021 09:12 AM   HGBA1C 9.6 (H) 04/03/2021 03:10 PM   HGBA1C 7.0 (H) 01/24/2013 04:07 AM   GFR 93.29 08/26/2021 01:00 PM   GFR 97.28 08/04/2021 03:00 PM   MICROALBUR 5.4 (H) 07/15/2021 09:12 AM   MICROALBUR <0.7 07/09/2020 08:22 AM    Last diabetic Eye exam:  Lab Results  Component Value Date/Time   HMDIABEYEEXA No Retinopathy 06/17/2020 12:00 AM    Last diabetic Foot exam: No results  found for: "HMDIABFOOTEX"   Lab Results  Component Value Date   CHOL 125 07/15/2021   HDL 36.50 (L) 07/15/2021   LDLCALC 60 07/15/2021   LDLDIRECT 97.0 07/09/2020   TRIG 143.0 07/15/2021   CHOLHDL 3 07/15/2021       Latest Ref Rng & Units 08/26/2021    1:00 PM 08/04/2021    3:00 PM 07/15/2021    9:12 AM  Hepatic Function  Total Protein 6.0 - 8.3 g/dL 6.5   6.5   Albumin 3.5 - 5.2 g/dL 4.2  4.3  4.4   AST 0 - 37 U/L 15   21   ALT 0 - 53 U/L 11   17   Alk Phosphatase 39 - 117 U/L 54   62   Total Bilirubin 0.2 - 1.2 mg/dL 0.5   0.5   Bilirubin, Direct 0.0 - 0.3 mg/dL 0.1       Lab Results  Component Value Date/Time   TSH 2.81 07/15/2021 09:12 AM   TSH 2.63 10/02/2019 12:05 PM       Latest Ref Rng & Units 10/16/2021    9:08 AM 08/26/2021    1:00 PM 08/04/2021    3:00 PM  CBC  WBC 4.0 - 10.5 K/uL 10.7  11.5  9.3   Hemoglobin 13.0 - 17.0 g/dL 10.1  8.7  Repeated and verified X2.  9.3   Hematocrit 39.0 - 52.0 % 35.9  30.8  32.1   Platelets 150 - 400 K/uL 192  330.0  312.0    Iron/TIBC/Ferritin/ %Sat    Component Value Date/Time   IRON 22 (L) 10/16/2021 0908   TIBC 447 10/16/2021 0908   FERRITIN 10 (L) 10/16/2021 0908   IRONPCTSAT 5 (L) 10/16/2021 0908   Lab Results  Component Value Date/Time   INR 3.6 (A) 10/15/2021 12:00 AM   INR 3.7 (A) 10/08/2021 12:00 AM   INR 2.79 (H) 09/22/2011 06:25 AM   INR 2.38 (H) 09/21/2011 06:22 AM   INR 1.8 08/27/2011 12:12 PM    Lab Results  Component Value Date/Time   VD25OH 44.82 07/15/2021 09:12 AM   VD25OH 29.46 (L) 07/09/2020 07:57 AM    Clinical ASCVD: Yes  The ASCVD Risk score (Arnett DK, et al., 2019) failed to calculate for the following reasons:   The valid total cholesterol range is 130 to 320 mg/dL       09/24/2021    2:08 PM 09/08/2021   10:45 AM 07/20/2021    4:50 PM  Depression screen PHQ 2/9  Decreased Interest 0 2 2  Down, Depressed, Hopeless 0 1 1  PHQ - 2 Score 0 3 3  Altered sleeping  3 3  Tired, decreased  energy  3 3  Change in appetite  0 3  Feeling bad or failure about yourself   0 2  Trouble concentrating  0 0  Moving slowly or fidgety/restless  0 0  Suicidal thoughts  0 0  PHQ-9 Score  9 14  Difficult doing work/chores  Very difficult     CHA2DS2/VAS Stroke Risk Points  Current as of yesterday     5 >= 2 Points: High Risk  1 - 1.99 Points: Medium Risk  0 Points: Low Risk    No Change      Points Metrics  1 Has Congestive Heart Failure:  Yes    Current as of yesterday  1 Has Vascular Disease:  Yes    Current as of yesterday  1 Has Hypertension:  Yes    Current as of yesterday  1 Age:  72    Current as of yesterday  1 Has Diabetes:  Yes    Current as of yesterday  0 Had Stroke:  No  Had TIA:  No  Had Thromboembolism:  No    Current as of yesterday  0 Male:  No    Current as of yesterday     Social History   Tobacco Use  Smoking Status Former   Packs/day: 1.00   Years: 31.00   Total pack years: 31.00   Types: Cigarettes   Quit date: 04/26/2018   Years since quitting: 3.4  Smokeless Tobacco Never   BP Readings from Last 3 Encounters:  10/16/21 127/79  09/16/21 118/72  09/08/21 134/70   Pulse Readings from Last 3 Encounters:  10/16/21 69  09/16/21 74  09/08/21 75   Wt Readings from Last 3 Encounters:  10/16/21 296 lb (134.3 kg)  09/24/21 290 lb (131.5 kg)  09/16/21 291 lb (132 kg)   BMI Readings from Last 3 Encounters:  10/16/21 41.28 kg/m  09/24/21 40.45 kg/m  09/16/21 40.59 kg/m    Assessment/Interventions: Review of patient past medical history, allergies, medications, health status, including review of consultants reports, laboratory and other test data, was performed as part of comprehensive evaluation and provision of chronic care  management services.   SDOH:  (Social Determinants of Health) assessments and interventions performed: Yes SDOH Interventions    Flowsheet Row Most Recent Value  SDOH Interventions   Financial Strain  Interventions Other (Comment)  [AZ&Me PAP]      SDOH Screenings   Alcohol Screen: Low Risk  (07/09/2020)   Alcohol Screen    Last Alcohol Screening Score (AUDIT): 0  Depression (PHQ2-9): Low Risk  (09/24/2021)   Depression (PHQ2-9)    PHQ-2 Score: 0  Recent Concern: Depression (PHQ2-9) - Medium Risk (09/08/2021)   Depression (PHQ2-9)    PHQ-2 Score: 9  Financial Resource Strain: Medium Risk (10/23/2021)   Overall Financial Resource Strain (CARDIA)    Difficulty of Paying Living Expenses: Somewhat hard  Food Insecurity: No Food Insecurity (09/24/2021)   Hunger Vital Sign    Worried About Running Out of Food in the Last Year: Never true    [place] in the Last Year: Never true  Housing: Low Risk  (07/09/2020)   Housing    Last Housing Risk Score: 0  Physical Activity: Inactive (09/24/2021)   Exercise Vital Sign    Days of Exercise per Week: 0 days    Minutes of Exercise per Session: 0 min  Social Connections: Not on file  Stress: No Stress Concern Present (09/24/2021)   [place]    Feeling of Stress : Not at all  Tobacco Use: Medium Risk (10/16/2021)   Patient History    Smoking Tobacco Use: Former    Smokeless Tobacco Use: Never    Passive Exposure: Not on file  Transportation Needs: No Transportation Needs (09/24/2021)   PRAPARE - Hydrologist (Medical): No    Lack of Transportation (Non-Medical): No    CCM Care Plan  Allergies  Allergen Reactions   Atorvastatin Other (See Comments)    Dizziness   Lisinopril Cough    Medications Reviewed Today     Reviewed by Sindy Guadeloupe, MD (Physician) on 10/18/21 at 1455  Med List Status: <None>   Medication Order Taking? Sig Documenting Provider Last Dose Status Informant  ACCU-CHEK FASTCLIX LANCETS [place] [id number] Yes Check blood sugar once daily and as instructed. Dx 250.00 [name] Bush, MD Taking Active Self  albuterol  (PROVENTIL) (2.5 MG/3ML) 0.083% nebulizer solution [id number] Yes INHALE 3 ML BY NEBULIZATION EVERY 6 HOURS AS NEEDED FOR WHEEZING OR SHORTNESS OF BREATH Strength (2.5 MG/3ML) 0.083% [name] Bush, MD Taking Active   albuterol (VENTOLIN HFA) 108 (90 Base) MCG/ACT inhaler [id number] Yes INHALE 2 PUFFS BY MOUTH EVERY 6 HOURS AS NEEDED FOR WHEEZE OR SHORTNESS OF Alvester Morin, MD Taking Active   amiodarone (PACERONE) 200 MG tablet [id number] Yes Take 1 tablet (200 mg total) by mouth daily. Vickie Epley, MD Taking Active   Blood Glucose Monitoring Suppl (BLOOD GLUCOSE MONITOR SYSTEM) W/DEVICE KIT [id number] Yes by Does not apply route. Use to check sugar once daily and as needed Dx: E11.9 **ONE TOUCH VERIO** [provider] Taking Active Self  carvedilol (COREG) 6.25 MG tablet [id number] Yes TAKE 1 TABLET BY MOUTH 2 TIMES DAILY WITH A MEAL. [name] Bush, MD Taking Active Self  Cholecalciferol (VITAMIN D) 50 MCG (2000 UT) CAPS [id number] Yes Take 1 capsule (2,000 Units total) by mouth daily.  Patient taking differently: Take 2,000 Units by mouth daily.   [name] Bush, MD Taking Active Self  citalopram (CELEXA) 10 MG tablet [id number] Yes Take 1 tablet (  10 mg total) by mouth daily. [name] Bush, MD Taking Active Self  Docusate Calcium (STOOL SOFTENER PO) [id number] Yes Take 1 tablet by mouth daily. [provider] Taking Active Self    Discontinued 10/16/21 0932 (Completed Course)          Med Note Lenor Derrick Aug 10, 2021  3:11 PM) On hold  fluticasone (FLONASE) 50 MCG/ACT nasal spray [id number] Yes SPRAY 2 SPRAYS INTO EACH NOSTRIL EVERY DAY  Patient taking differently: Place 1 spray into both nostrils daily as needed for allergies.   [name] Bush, MD Taking Active Self  fluticasone-salmeterol [facility] INHUB) 100-50 MCG/ACT AEPB [id number] Yes Inhale 1 puff into the lungs 2 (two) times daily. [name] Bush, MD Taking Active Self  furosemide  (LASIX) 80 MG tablet [id number] Yes Take 1 tablet (80 mg total) by mouth 2 (two) times daily. [name] Bush, MD Taking Active Self  glimepiride (AMARYL) 4 MG tablet [id number] Yes TAKE 1 TABLET BY MOUTH EVERY DAY WITH Sander Radon, MD Taking Active   hydrOXYzine (ATARAX) 50 MG tablet [id number] Yes TAKE 1 TABLET (50 MG TOTAL) BY MOUTH 2 (TWO) TIMES DAILY AS NEEDED FOR ANXIETY. [name] Bush, MD Taking Active Self  ipratropium (ATROVENT HFA) 17 MCG/ACT inhaler [id number] No Inhale 2 puffs into the lungs every 6 (six) hours as needed for wheezing.  Patient not taking: Reported on 10/16/2021   [name] Bush, MD Not Taking Active Self   Patient not taking:   Discontinued 10/16/21 0933 metFORMIN (GLUCOPHAGE) 1000 MG tablet [id number] Yes TAKE 1 TABLET (1,000 MG TOTAL) BY MOUTH 2 (TWO) TIMES DAILY WITH A MEAL. [name] Bush, MD Taking Active Self  metolazone (ZAROXOLYN) 2.5 MG tablet [id number] Yes Take 1 tablet (2.5 mg total) by mouth 2 (two) times a week. [name] Bush, MD Taking Active   montelukast (SINGULAIR) 10 MG tablet [id number] Yes TAKE 1 TABLET BY MOUTH EVERYDAY AT BEDTIME [name] Bush, MD Taking Active Self  NON FORMULARY [id number] Yes 1,200 mg daily. SEA MOSS [provider] Taking Active    Patient not taking:   Discontinued 10/16/21 0934 (Completed Course)   NON FORMULARY [id number] No daily. SUPER BEETS  Patient not taking: Reported on 10/16/2021   [provider] Not Taking Active   NON FORMULARY [id number] Yes 500 mg once. TURMERIC [provider] Taking Active   ONETOUCH VERIO test strip [id number] Yes USE AS INSTRUCTED TO CHECK THREE TIMES DAILY AND AS NEEDED. E11.8 [name] Bush, MD Taking Active Self  pantoprazole (PROTONIX) 40 MG tablet [id number] Yes TAKE 1 TABLET (40 MG TOTAL) BY MOUTH 2 (TWO) TIMES DAILY BEFORE A MEAL. [name] Bush, MD Taking Active Self  Probiotic Product (PROBIOTIC DAILY PO) [id number] Yes Take 1  tablet by mouth daily. [provider] Taking Active Self  simvastatin (ZOCOR) 20 MG tablet [id number] Yes TAKE 1 TABLET BY MOUTH EVERY DAY IN THE Recardo Evangelist, MD Taking Active   triamcinolone cream (KENALOG) 0.1 % [id number] Yes Apply 1 application. topically 2 (two) times daily. Apply to AA.  Patient taking differently: Apply 1 application  topically 3 (three) times daily. Apply to AA.   [name] Bush, MD Taking Active Self  umeclidinium bromide (INCRUSE ELLIPTA) 62.5 MCG/ACT AEPB [id number] Yes Inhale 1 puff into the lungs daily. [name] Bush, MD Taking Active Self  vitamin B-12 (CYANOCOBALAMIN) 1000 MCG tablet [id number] Yes Take 1 tablet (1,000 mcg total) by mouth every Monday, Wednesday, and Friday.  Patient taking differently: Take 1,000 mcg by mouth daily.  [name] Bush, MD Taking Active Self  warfarin (COUMADIN) 5 MG tablet [id number] Yes TAKE 1 TABLET BY MOUTH DAILY OR AS DIRECTED BY ANTICOAGULATION CLINIC [name] Bush, MD Taking Active   Med List Note Chauncey Fischer, RN 01/05/18 1237): UDS 01/05/18            Patient Active Problem List   Diagnosis Date Noted   Leg ulcer, right, limited to breakdown of skin ([place]) 08/06/2021   Hypercapnia 08/06/2021   Abdominal pain 07/21/2021   Atrial fibrillation ([place]) 07/15/2021   Vitreous floaters of left eye 06/05/2020   PLMD (periodic limb movement disorder) 06/05/2020   Ulceration of intestine    Pedal edema 02/25/2020   Neck pain on left side 02/25/2020   Mediastinal mass 01/05/2020   Skin rash 10/12/2019   Vitamin D deficiency 04/19/2019   Low serum vitamin B12 04/19/2019   Iron deficiency anemia 01/02/2019   Epigastric abdominal pain 09/19/2018   COPD (chronic obstructive pulmonary disease) ([place]) 07/25/2018   Chronic neck pain (Tertiary Area of Pain) (R>L) 01/05/2018   Chronic upper extremity pain (Fourth Area of Pain) (R>L) 01/05/2018   Chronic upper back pain 01/05/2018   Chronic  bilateral low back pain without sciatica 01/05/2018   Chronic pain syndrome 01/05/2018   Right temporal headache 10/17/2017   Right leg weakness 07/14/2017   AAA (abdominal aortic aneurysm) without rupture ([place]) 09/15/2016   Adrenal adenoma, left 08/13/2016   Nodule of right lung 08/13/2016   Fatty liver 08/01/2016   Right shoulder pain 07/26/2016   Restrictive lung disease 57/04/7791   Chronic systolic CHF (congestive heart failure) ([place])    Tinea pedis 06/30/2015   Gassiness 02/21/2015   Advanced care planning/counseling discussion 10/16/2014   Encounter for general adult medical examination with abnormal findings 10/16/2014   Type 2 diabetes mellitus with other specified complication ([place]) 90/30/0923   Medicare annual wellness visit, subsequent 30/10/6224   Umbilical hernia without obstruction and without gangrene 09/03/2013   Left flank mass 09/03/2013   Hyperlipidemia associated with type 2 diabetes mellitus ([place]) 06/29/2013   Coronary artery disease 06/29/2013   Cardiomyopathy, dilated ([place]) 06/29/2013   Abnormal toxicological findings 06/24/2013   Exertional dyspnea 06/14/2013   Malaise and fatigue 03/14/2013   Hypertension    Panic attacks    History of atrial flutter 07/07/2011   Obesity, morbid, BMI 40.0-49.9 ([place]) 07/07/2011   Ex-smoker 08/13/2009   Obstructive sleep apnea 08/13/2009   DDD (degenerative disc disease), cervical 08/13/2009   Allergic rhinitis 06/24/2009    Immunization History  Administered Date(s) Administered   Influenza, High Dose Seasonal PF 03/26/2021   Influenza,inj,Quad PF,6+ Mos 05/29/2014, 02/21/2015, 02/22/2017, 03/16/2018, 01/02/2019, 01/04/2020   Influenza-Unspecified 01/24/2013   PFIZER(Purple Top)SARS-COV-2 Vaccination 07/12/2019, 08/02/2019, 03/19/2020   Pfizer Covid-19 Vaccine Bivalent Booster 26yr & up 03/26/2021   Pneumococcal Polysaccharide-23 02/24/2013   Td 04/26/2006    Conditions to be addressed/monitored:  Hypertension,  Hyperlipidemia, Diabetes, Atrial Fibrillation, Heart Failure, Coronary Artery Disease, and COPD  Care Plan : CCM Pharmacy Care Plan  Updates made by FCharlton [name] R[place]heat[place]ince 10/23/2021 12:00 AM     Problem: Hypertension, Hyperlipidemia, Diabetes, Atrial Fibrillation, Heart Failure, Coronary Artery Disease, and COP   Priority: High     Long-Range Goal: Disease mgmt   Start Date: 10/23/2021  Expected End Date: 10/24/2022  This Visit's Progress: On track  Priority: High  Note:   Current Barriers:  Unable to independently afford treatment regimen Unable to maintain control of diabetes  Pharmacist  Clinical Goal(s):  Patient will verbalize ability to afford treatment regimen adhere to plan to optimize therapeutic regimen for diabetes as evidenced by report of adherence to recommended medication management changes through collaboration with PharmD and provider.   Interventions: 1:1 collaboration with [name] Bush, MD regarding development and update of comprehensive plan of care as evidenced by provider attestation and co-signature Inter-disciplinary care team collaboration (see longitudinal plan of care) Comprehensive medication review performed; medication list updated in electronic medical record  Hypertension / Heart Failure (BP goal <130/80) -Controlled -Last ejection fraction: >55% (Date: 02/2020) -HF type: Diastolic; NYHA Class: II/III -Follows with cardiology (Dr Nehemiah Massed) -Current treatment: Carvedilol 6.25 mg BID - Appropriate, Effective, Safe, Accessible Furosemide 80 mg BID -Appropriate, Effective, Safe, Accessible Metolazone 2.5 mg twice weekly -Appropriate, Effective, Safe, Accessible -Medications previously tried: losartan, metoprolol -Educated on BP goals and benefits of medications for prevention of heart attack, stroke and kidney damage; -Counseled to monitor BP at home daily -Recommended to continue current medication  Atrial Fibrillation (Goal: prevent  stroke and major bleeding) -Not ideally controlled - pt recently switched from Eliquis to warfarin due to cost issues; he has been following closely with PCP warfarin clinic and INR is not yet at goal -Cardioversion 07/2021 -CHADSVASC: 5 -Follows with cardiology (Dr Nehemiah Massed) -Current treatment: Carvedilol 6.25 mg BID - Appropriate, Effective, Safe, Accessible Amiodarone 200 mg daily -Appropriate, Effective, Safe, Accessible Warfarin 5 mg - 1/2 tab daily except 1 tab M/Th -Appropriate, Effective, Query Safe (INR > 3) -Medications previously tried: Eliquis (cost) -Counseled on increased risk of stroke due to Afib and benefits of anticoagulation for stroke prevention; importance of adherence to anticoagulant exactly as prescribed; bleeding risk associated with warfarin and importance of self-monitoring for signs/symptoms of bleeding; avoidance of NSAIDs due to increased bleeding risk with anticoagulants; importance of regular laboratory monitoring; -Recommended to continue current medication  Hyperlipidemia / CAD (LDL goal < 70) -Controlled - LDL 60 (06/2021) at goal -Hx CAD (3v disease on CT scan); also AAA; no aspirin due to warfarin -Current treatment: Simvastatin 20 mg daily HS - Appropriate, Effective, Safe, Accessible -Medications previously tried: n/a  -Educated on Cholesterol goals; Benefits of statin for ASCVD risk reduction; -Recommended to continue current medication  Diabetes (A1c goal <8%) -Uncontrolled - A1c 10.4% (06/2021); he has started watching his diet and home BG appears improved; PCP would like him to be on Farxiga -Current home glucose readings fasting glucose: 156 - 210 -Denies hypoglycemic/hyperglycemic symptoms -Pt does not sleep well, breakfast is sometimes 2pm -Current medications: Metformin 1000 mg BID - Appropriate, Query Effective Glimepiride 4 mg daily -Appropriate, Query Effective One Touch [employer]io supplies -Medications previously tried: [place] (cost),  Januvia  -Current meal patterns:  drinks: coffee - sweet n low, nondairy creamer -Current exercise: n/a -Educated on A1c and blood sugar goals; Complications of diabetes including kidney damage, retinal damage, and cardiovascular disease; Benefits of routine self-monitoring of blood sugar; -Counseled on diet and exercise extensively -Reviewed Farxiga - typically if A1c is > 10% it is recommended to avoid SGLT2i due to greater risk for side effects; fasting BG has improved with diet changes so A1c may be < 10% at this point, it would be reasonable to restart [place] if A1c is < 10% at upcoming CPE (11/02/21), Wilder Glade will be free through AZ&Me -Recommended to continue current medication  COPD (Goal: control symptoms and prevent exacerbations) -Controlled - pt affirms compliance with inhalers however he is struggling with cost and multiple inhalers -Gold Grade: Gold 3 (FEV1  30-49%) -Current COPD Classification:  D (high sx, >/=2 exacerbations/yr) -Pulmonary function testing: Spirometry 05/2018: pre: FVC 54%, FEV1 51%, ratio 0.71 post: FVC 54%, FEV1 49%, ratio 0.69 -Exacerbations requiring treatment in last 6 months: 0 (last 07/2020) -Current treatment  Incruse Ellipta (umeclidinium) 1 puff daily - Appropriate, Effective, Safe, Query Accessible Wixela (fluticasone-salmeterol) 1 puff BID -Appropriate, Effective, Safe, Query Accessible Atrovent (ipratropium) 2 puffs PRN - Appropriate, Effective, Safe, Accessible Albuterol HFA -Appropriate, Effective, Safe, Accessible Albuterol neb -Appropriate, Effective, Safe, Accessible Montelukast 10 mg daily HS -Appropriate, Effective, Safe, Accessible Flonase nasal spray -Appropriate, Effective, Safe, Accessible -Medications previously tried: Trelegy, Advair, Spiriva -Patient reports consistent use of maintenance inhaler -Counseled on Proper inhaler technique; Benefits of consistent maintenance inhaler use -Recommend to switch Incruse/Wixela to [employer]  (triple therapy) 2 puff BID; pt enrolled in [employer] for free Breztri  Insomnia (Goal: manage symptoms) -Not ideally controlled -Hx OSA on CPAP; he is noncompliant with CPAP due to uncomfortable mask, he has not tried new mask in a few years -Lays down 11-12pm, gets up 9-10 am to let dogs out -Recommend new referral to pulmonary to discuss options  Patient Goals/Self-Care Activities Patient will:  - take medications as prescribed as evidenced by patient report and record review focus on medication adherence by routine check glucose daily, document, and provide at future appointments check blood pressure daily, document, and provide at future appointments       Medication Assistance:  AZ&Me - Breztri approved. Wilder Glade pending A1c < 10%  Compliance/Adherence/Medication fill history: Care Gaps: Foot, eye exams  Star-Rating Drugs: Glimepiride - PDC 96% Metformin - PDC 100%  Medication Access: Within the past 30 days, how often has patient missed a dose of medication? 0 Is a pillbox or other method used to improve adherence? Yes  Factors that may affect medication adherence? financial need Are meds synced by current pharmacy? No  Are meds delivered by current pharmacy? No  Does patient experience delays in picking up medications due to transportation concerns? No   [facility]stream Services Reviewed: Is patient disadvantaged to use [facility]?: No  Current Rx insurance plan: [facility] Name and location of Current pharmacy:  [id number] [id number]: [phone]: [phone] [facility] services reviewed with patient today?: No  Patient requests to transfer care to [facility]?: No  Reason patient declined to change pharmacies: Not mentioned at this visit   Care Plan and Follow Up Patient Decision:  Patient agrees to Care Plan and Follow-up.  Plan: Telephone follow up appointment  with care management team member scheduled for:  1 month  LCharlene Brooke PharmD, BRegency Hospital Company Of Macon, LLCClinical Pharmacist LMorrillPrimary Care at SReg[phone]

## 2021-10-21 ENCOUNTER — Encounter: Payer: Medicare Other | Admitting: Internal Medicine

## 2021-10-21 DIAGNOSIS — L97812 Non-pressure chronic ulcer of other part of right lower leg with fat layer exposed: Secondary | ICD-10-CM | POA: Diagnosis not present

## 2021-10-21 DIAGNOSIS — J449 Chronic obstructive pulmonary disease, unspecified: Secondary | ICD-10-CM | POA: Diagnosis not present

## 2021-10-21 DIAGNOSIS — I4891 Unspecified atrial fibrillation: Secondary | ICD-10-CM | POA: Diagnosis not present

## 2021-10-21 DIAGNOSIS — E11622 Type 2 diabetes mellitus with other skin ulcer: Secondary | ICD-10-CM | POA: Diagnosis not present

## 2021-10-21 DIAGNOSIS — I87331 Chronic venous hypertension (idiopathic) with ulcer and inflammation of right lower extremity: Secondary | ICD-10-CM | POA: Diagnosis not present

## 2021-10-21 DIAGNOSIS — I890 Lymphedema, not elsewhere classified: Secondary | ICD-10-CM | POA: Diagnosis not present

## 2021-10-21 DIAGNOSIS — Z7901 Long term (current) use of anticoagulants: Secondary | ICD-10-CM | POA: Diagnosis not present

## 2021-10-22 ENCOUNTER — Ambulatory Visit: Admission: RE | Admit: 2021-10-22 | Payer: Medicare Other | Source: Ambulatory Visit

## 2021-10-22 ENCOUNTER — Other Ambulatory Visit: Payer: Self-pay | Admitting: Family Medicine

## 2021-10-22 ENCOUNTER — Telehealth: Payer: Self-pay | Admitting: Pharmacist

## 2021-10-22 DIAGNOSIS — E1169 Type 2 diabetes mellitus with other specified complication: Secondary | ICD-10-CM

## 2021-10-22 MED FILL — Ferumoxytol Inj 510 MG/17ML (30 MG/ML) (Elemental Fe): INTRAVENOUS | Qty: 17 | Status: AC

## 2021-10-22 NOTE — Telephone Encounter (Signed)
Inhalers: Patient is taking Wixela and Spiriva daily and struggling with cost and multiple inhalers. Discussed consolidating his inhalers into 1 triple-therapy inhaler and enrolling in patient assistance, pt agrees.  Recommend to switch Wixela/Spiriva to [employer] - 2 puff BID. Enrolled in AZ&Me pt assistance - pt was accepted, just needs Rx sent to [facility].  Diabetes: Of note, AZ[employer]Me will be able to fill [place] as well. However the last A1c was 10.1%, it is typically recommended to avoid SGLT2i if A1c is > 10% due to greater risk for side effects. Patient reports he has been making diet improvements and fasting BG has improved to 150-210 range.   Pt has upcoming physical 11/02/21, if A1c is <10% at that time it would be reasonable to re-start Farxiga (through AZ&Me).

## 2021-10-23 ENCOUNTER — Inpatient Hospital Stay: Payer: Medicare Other

## 2021-10-23 VITALS — BP 117/67 | HR 75 | Temp 98.1°F | Resp 16

## 2021-10-23 DIAGNOSIS — Z8249 Family history of ischemic heart disease and other diseases of the circulatory system: Secondary | ICD-10-CM | POA: Diagnosis not present

## 2021-10-23 DIAGNOSIS — E785 Hyperlipidemia, unspecified: Secondary | ICD-10-CM | POA: Diagnosis not present

## 2021-10-23 DIAGNOSIS — I509 Heart failure, unspecified: Secondary | ICD-10-CM

## 2021-10-23 DIAGNOSIS — M47812 Spondylosis without myelopathy or radiculopathy, cervical region: Secondary | ICD-10-CM | POA: Diagnosis not present

## 2021-10-23 DIAGNOSIS — J449 Chronic obstructive pulmonary disease, unspecified: Secondary | ICD-10-CM | POA: Diagnosis not present

## 2021-10-23 DIAGNOSIS — I5022 Chronic systolic (congestive) heart failure: Secondary | ICD-10-CM | POA: Diagnosis not present

## 2021-10-23 DIAGNOSIS — Z7984 Long term (current) use of oral hypoglycemic drugs: Secondary | ICD-10-CM | POA: Diagnosis not present

## 2021-10-23 DIAGNOSIS — Z87891 Personal history of nicotine dependence: Secondary | ICD-10-CM | POA: Diagnosis not present

## 2021-10-23 DIAGNOSIS — I110 Hypertensive heart disease with heart failure: Secondary | ICD-10-CM

## 2021-10-23 DIAGNOSIS — E1159 Type 2 diabetes mellitus with other circulatory complications: Secondary | ICD-10-CM | POA: Diagnosis not present

## 2021-10-23 DIAGNOSIS — D509 Iron deficiency anemia, unspecified: Secondary | ICD-10-CM | POA: Diagnosis not present

## 2021-10-23 DIAGNOSIS — Z801 Family history of malignant neoplasm of trachea, bronchus and lung: Secondary | ICD-10-CM | POA: Diagnosis not present

## 2021-10-23 DIAGNOSIS — I2510 Atherosclerotic heart disease of native coronary artery without angina pectoris: Secondary | ICD-10-CM | POA: Diagnosis not present

## 2021-10-23 DIAGNOSIS — D508 Other iron deficiency anemias: Secondary | ICD-10-CM

## 2021-10-23 DIAGNOSIS — I4891 Unspecified atrial fibrillation: Secondary | ICD-10-CM | POA: Diagnosis not present

## 2021-10-23 DIAGNOSIS — Z79899 Other long term (current) drug therapy: Secondary | ICD-10-CM | POA: Diagnosis not present

## 2021-10-23 DIAGNOSIS — K760 Fatty (change of) liver, not elsewhere classified: Secondary | ICD-10-CM | POA: Diagnosis not present

## 2021-10-23 DIAGNOSIS — Z833 Family history of diabetes mellitus: Secondary | ICD-10-CM | POA: Diagnosis not present

## 2021-10-23 DIAGNOSIS — Z7901 Long term (current) use of anticoagulants: Secondary | ICD-10-CM | POA: Diagnosis not present

## 2021-10-23 DIAGNOSIS — R5383 Other fatigue: Secondary | ICD-10-CM | POA: Diagnosis not present

## 2021-10-23 DIAGNOSIS — I420 Dilated cardiomyopathy: Secondary | ICD-10-CM | POA: Diagnosis not present

## 2021-10-23 DIAGNOSIS — G4733 Obstructive sleep apnea (adult) (pediatric): Secondary | ICD-10-CM | POA: Diagnosis not present

## 2021-10-23 DIAGNOSIS — Z808 Family history of malignant neoplasm of other organs or systems: Secondary | ICD-10-CM | POA: Diagnosis not present

## 2021-10-23 DIAGNOSIS — E119 Type 2 diabetes mellitus without complications: Secondary | ICD-10-CM | POA: Diagnosis not present

## 2021-10-23 MED ORDER — SODIUM CHLORIDE 0.9 % IV SOLN
Freq: Once | INTRAVENOUS | Status: AC
  Filled 2021-10-23: qty 250

## 2021-10-23 MED ORDER — SODIUM CHLORIDE 0.9 % IV SOLN
510.0000 mg | INTRAVENOUS | Status: AC
  Administered 2021-10-23: 510 mg via INTRAVENOUS
  Filled 2021-10-23: qty 510

## 2021-10-23 MED ORDER — BREZTRI AEROSPHERE 160-9-4.8 MCG/ACT IN AERO: 2.0000 | INHALATION_SPRAY | Freq: Two times a day (BID) | RESPIRATORY_TRACT | 3 refills | Status: DC

## 2021-10-23 NOTE — Telephone Encounter (Signed)
Noted.  Agree with switch.  Breztri sent to requested pharmacy. We will await updated A1c prior to deciding on Farxiga use.

## 2021-10-23 NOTE — Progress Notes (Addendum)
[name] ([id number]) Visit Report for 10/21/2021 Arrival Information Details Patient Name: [name], [name]. Date of Service: 10/21/2021 12:30 PM Medical Record Number: [id number] Patient Account Number: [id number] Date of Birth/Sex: 29-Nov-1955 (66 y.o. M) Treating RN: [name] Primary Care [name]: [name] Other Clinician: Referring [name]: [name] Treating [name]/Extender: [name] in Treatment: 7 Visit Information History Since Last Visit All ordered tests and consults were completed: No Patient Arrived: [name] Added or deleted any medications: No Arrival Time: 12:35 Any new allergies or adverse reactions: No Accompanied By: self Had a fall or experienced change in No Transfer Assistance: None activities of daily living that may affect Patient Identification Verified: Yes risk of falls: Secondary Verification Process Completed: Yes Signs or symptoms of abuse/neglect since last visito No Patient Requires Transmission-Based Precautions: No Hospitalized since last visit: No Patient Has Alerts: Yes Implantable device outside of the clinic excluding No Patient Alerts: DIABETIC cellular tissue based products placed in the center since last visit: Has Dressing in Place as Prescribed: No Has Compression in Place as Prescribed: No Pain Present Now: No Electronic Signature(s) Signed: 10/23/2021 10:10:35 AM By: [name] Coria RN Entered By: [name] on 10/21/2021 12:46:10 [name] ([id number]) -------------------------------------------------------------------------------- Clinic Level of Care Assessment Details Patient Name: [name]. Date of Service: 10/21/2021 12:30 PM Medical Record Number: [id number] Patient Account Number: [id number] Date of Birth/Sex: [birth year] (66 y.o. M) Treating RN: [name] Primary Care [name]: [name] Other Clinician: Referring [name]: [name] Treating  [name]/Extender: [name] in Treatment: 7 Clinic Level of Care Assessment Items TOOL 4 Quantity Score X - Use when only an EandM is performed on FOLLOW-UP visit 1 0 ASSESSMENTS - Nursing Assessment / Reassessment X - Reassessment of Co-morbidities (includes updates in patient status) 1 10 X- 1 5 Reassessment of Adherence to Treatment Plan ASSESSMENTS - Wound and Skin Assessment / Reassessment X - Simple Wound Assessment / Reassessment - one wound 1 5 '[]'$  - 0 Complex Wound Assessment / Reassessment - multiple wounds '[]'$  - 0 Dermatologic / Skin Assessment (not related to wound area) ASSESSMENTS - Focused Assessment '[]'$  - Circumferential Edema Measurements - multi extremities 0 '[]'$  - 0 Nutritional Assessment / Counseling / Intervention '[]'$  - 0 Lower Extremity Assessment (monofilament, tuning fork, pulses) '[]'$  - 0 Peripheral Arterial Disease Assessment (using hand held doppler) ASSESSMENTS - Ostomy and/or Continence Assessment and Care '[]'$  - Incontinence Assessment and Management 0 '[]'$  - 0 Ostomy Care Assessment and Management (repouching, etc.) PROCESS - Coordination of Care X - Simple Patient / Family Education for ongoing care 1 15 '[]'$  - 0 Complex (extensive) Patient / Family Education for ongoing care '[]'$  - 0 Staff obtains Programmer, systems, Records, Test Results / Process Orders '[]'$  - 0 Staff telephones HHA, Nursing Homes / Clarify orders / etc '[]'$  - 0 Routine Transfer to another Facility (non-emergent condition) '[]'$  - 0 Routine Hospital Admission (non-emergent condition) '[]'$  - 0 New Admissions / Biomedical engineer / Ordering NPWT, Apligraf, etc. '[]'$  - 0 Emergency Hospital Admission (emergent condition) X- 1 10 Simple Discharge Coordination '[]'$  - 0 Complex (extensive) Discharge Coordination PROCESS - Special Needs '[]'$  - Pediatric / Minor Patient Management 0 '[]'$  - 0 Isolation Patient Management '[]'$  - 0 Hearing / Language / Visual special needs '[]'$  - 0 Assessment of  Community assistance (transportation, D/C planning, etc.) '[]'$  - 0 Additional assistance / Altered mentation '[]'$  - 0 Support Surface(s) Assessment (bed, cushion, seat, etc.) INTERVENTIONS - Wound Cleansing / Measurement Martelle,  [name] ([id number]) X- 1 5 Simple Wound Cleansing - one wound '[]'$  - 0 Complex Wound Cleansing - multiple wounds X- 1 5 Wound Imaging (photographs - any number of wounds) '[]'$  - 0 Wound Tracing (instead of photographs) X- 1 5 Simple Wound Measurement - one wound '[]'$  - 0 Complex Wound Measurement - multiple wounds INTERVENTIONS - Wound Dressings '[]'$  - Small Wound Dressing one or multiple wounds 0 X- 1 15 Medium Wound Dressing one or multiple wounds '[]'$  - 0 Large Wound Dressing one or multiple wounds '[]'$  - 0 Application of Medications - topical '[]'$  - 0 Application of Medications - injection INTERVENTIONS - Miscellaneous '[]'$  - External ear exam 0 '[]'$  - 0 Specimen Collection (cultures, biopsies, blood, body fluids, etc.) '[]'$  - 0 Specimen(s) / Culture(s) sent or taken to Lab for analysis '[]'$  - 0 Patient Transfer (multiple staff / Civil Service fast streamer / Similar devices) '[]'$  - 0 Simple Staple / Suture removal (25 or less) '[]'$  - 0 Complex Staple / Suture removal (26 or more) '[]'$  - 0 Hypo / Hyperglycemic Management (close monitor of Blood Glucose) '[]'$  - 0 Ankle / Brachial Index (ABI) - do not check if billed separately X- 1 5 Vital Signs Has the patient been seen at the hospital within the last three years: Yes Total Score: 80 Level Of Care: New/Established - Level 3 Electronic Signature(s) Signed: 10/23/2021 10:10:35 AM By: [name] Coria RN Entered By: [name] on 10/21/2021 14:29:17 [name] ([id number]) -------------------------------------------------------------------------------- Encounter Discharge Information Details Patient Name: [name]. Date of Service: 10/21/2021 12:30 PM Medical Record Number: [id number] Patient Account Number:  [id number] Date of Birth/Sex: [birth year] (66 y.o. M) Treating RN: [name] Primary Care [name] Naim: [name] Other Clinician: Referring Dayanne Yiu: [name] Treating Kamdyn Covel/Extender: [name] in Treatment: 7 Encounter Discharge Information Items Discharge Condition: Stable Ambulatory Status: Ambulatory Discharge Destination: Home Transportation: Private Auto Accompanied By: self Schedule Follow-up Appointment: Yes Clinical Summary of Care: Electronic Signature(s) Signed: 10/21/2021 2:30:58 PM By: [name] Coria RN Entered By: [name] on 10/21/2021 14:30:58 Veilleux, [name] ([id number]) -------------------------------------------------------------------------------- Lower Extremity Assessment Details Patient Name: [name]. Date of Service: 10/21/2021 12:30 PM Medical Record Number: [id number] Patient Account Number: [id number] Date of Birth/Sex: August 18, 1955 (66 y.o. M) Treating RN: [name] Primary Care Katelyn Broadnax: [name] Other Clinician: Referring Marshal Schrecengost: [name] Treating Brighten Orndoff/Extender: [name] in Treatment: 7 Edema Assessment Assessed: [Left: No] [Right: No] [Left: Edema] [Right: :] Calf Left: Right: Point of Measurement: 38 cm From Medial Instep 46 cm Ankle Left: Right: Point of Measurement: 12 cm From Medial Instep 26.5 cm Vascular Assessment Pulses: Dorsalis Pedis Palpable: [Right:Yes] Electronic Signature(s) Signed: 10/23/2021 10:10:35 AM By: [name] Coria RN Entered By: [name] on 10/21/2021 12:43:32 [place]bach, [name] ([id number]) -------------------------------------------------------------------------------- Multi Wound Chart Details Patient Name: [name]. Date of Service: 10/21/2021 12:30 PM Medical Record Number: [id number] Patient Account Number: [id number] Date of Birth/Sex: 03-23-1956 (66 y.o. M) Treating RN: [name] Primary Care Cay Kath:  [name] Other Clinician: Referring Christelle Igoe: [name] Treating Makaela Cando/Extender: [name] in Treatment: 7 Vital Signs Height(in): 71 Pulse(bpm): 81 Weight(lbs): 295 Blood Pressure(mmHg): 102/64 Body Mass Index(BMI): 41.1 Temperature(F): 98.5 Respiratory Rate(breaths/min): 18 Photos: [N/A:N/A] Wound Location: Right, Circumferential Lower Leg N/A N/A Wounding Event: Gradually Appeared N/A N/A Primary Etiology: Lymphedema N/A N/A Comorbid History: Chronic Obstructive Pulmonary N/A N/A Disease (COPD), Arrhythmia, Congestive Heart Failure, Type II Diabetes Date Acquired: 08/03/2021 N/A N/A Weeks of Treatment: 7 N/A N/A Wound Status:  Open N/A N/A Wound Recurrence: No N/A N/A Measurements L x W x D (cm) 17x42x0.1 N/A N/A Area (cm) : 560.774 N/A N/A Volume (cm) : 56.077 N/A N/A % Reduction in Area: -41.70% N/A N/A % Reduction in Volume: -41.70% N/A N/A Classification: Full Thickness Without Exposed N/A N/A Support Structures Exudate Amount: Medium N/A N/A Exudate Type: Serosanguineous N/A N/A Exudate Color: red, brown N/A N/A Granulation Amount: Medium (34-66%) N/A N/A Granulation Quality: Red, Pink N/A N/A Necrotic Amount: Medium (34-66%) N/A N/A Exposed Structures: Fat Layer (Subcutaneous Tissue): N/A N/A Yes Fascia: No Tendon: No Muscle: No Joint: No Bone: No Epithelialization: Small (1-33%) N/A N/A Treatment Notes Electronic Signature(s) Signed: 10/23/2021 10:10:35 AM By: [name] Coria RN Entered By: [name] on 10/21/2021 12:45:43 Trostle, [name] ([id number]) -------------------------------------------------------------------------------- [place] Details Patient Name: [name]. Date of Service: 10/21/2021 12:30 PM Medical Record Number: [id number] Patient Account Number: [id number] Date of Birth/Sex: Aug 09, 1955 (66 y.o. M) Treating RN: [name] Primary Care Franci Oshana: [name]  Other Clinician: Referring Lynne Righi: [name] Treating Dian Laprade/Extender: [name] in Treatment: 7 Active Inactive Electronic Signature(s) Signed: 11/06/2021 8:51:55 AM By: Gretta Cool, BSN, RN, CWS, Kim RN, BSN Signed: 01/11/2022 2:33:12 PM By: [name] Coria RN Previous Signature: 10/23/2021 10:10:35 AM Version By: [name] Coria RN Entered By: Gretta Cool, BSN, RN, CWS, Kim on 11/06/2021 08:51:55 Rainone, [name] ([id number]) -------------------------------------------------------------------------------- Pain Assessment Details Patient Name: ADGER, CANTERA. Date of Service: 10/21/2021 12:30 PM Medical Record Number: [id number] Patient Account Number: [id number] Date of Birth/Sex: Jul 25, 1955 (66 y.o. M) Treating RN: [name] Primary Care Bali Lyn: [name] Other Clinician: Referring Bradey Luzier: [name] Treating Amaiyah Nordhoff/Extender: [name] in Treatment: 7 Active Problems Location of Pain Severity and Description of Pain Patient Has Paino No Site Locations Pain Management and Medication Current Pain Management: Electronic Signature(s) Signed: 10/23/2021 10:10:35 AM By: [name] Coria RN Entered By: [name] on 10/21/2021 12:39:41 Shoun, [name] ([id number]) -------------------------------------------------------------------------------- Patient/Caregiver Education Details Patient Name: [name]. Date of Service: 10/21/2021 12:30 PM Medical Record Number: [id number] Patient Account Number: [id number] Date of Birth/Gender: May 25, 1955 (66 y.o. M) Treating RN: [name] Primary Care Physician: [name] Other Clinician: Referring Physician: Ria [name] Treating Physician/Extender: [name] in Treatment: 7 Education Assessment Education Provided To: Patient Education Topics Provided Wound Debridement: Methods: Explain/Verbal Responses: State content correctly Electronic  Signature(s) Signed: 10/23/2021 10:10:35 AM By: [name] Coria RN Entered By: [name] on 10/21/2021 14:29:36 Wisniewski, [name] ([id number]) -------------------------------------------------------------------------------- Wound Assessment Details Patient Name: [name]. Date of Service: 10/21/2021 12:30 PM Medical Record Number: [id number] Patient Account Number: [id number] Date of Birth/Sex: 05/25/1955 (66 y.o. M) Treating RN: [name] Primary Care Freedom Peddy: [name] Other Clinician: Referring Julieta Rogalski: [name] Treating Amahri Dengel/Extender: [name] in Treatment: 7 Wound Status Wound Number: 1 Primary Lymphedema Etiology: Wound Location: Right, Circumferential Lower Leg Wound Open Wounding Event: Gradually Appeared Status: Date Acquired: 08/03/2021 Comorbid Chronic Obstructive Pulmonary Disease (COPD), Weeks Of Treatment: 7 History: Arrhythmia, Congestive Heart Failure, Type II Diabetes Clustered Wound: No Photos Wound Measurements Length: (cm) 17 Width: (cm) 42 Depth: (cm) 0.1 Area: (cm) 560.774 Volume: (cm) 56.077 % Reduction in Area: -41.7% % Reduction in Volume: -41.7% Epithelialization: Small (1-33%) Tunneling: No Undermining: No Wound Description Classification: Full Thickness Without Exposed Support Structu Exudate Amount: Medium Exudate Type: Serosanguineous Exudate Color: red, brown res Foul Odor After Cleansing: No Slough/Fibrino Yes Wound Bed Granulation Amount: Medium (34-66%) Exposed Structure Granulation Quality: Red, Pink Fascia Exposed:  No Necrotic Amount: Medium (34-66%) Fat Layer (Subcutaneous Tissue) Exposed: Yes Necrotic Quality: Adherent Slough Tendon Exposed: No Muscle Exposed: No Joint Exposed: No Bone Exposed: No Electronic Signature(s) Signed: 10/23/2021 10:10:35 AM By: [name] Coria RN Entered By: [name] on 10/21/2021 12:42:22 Ghee, [name]  ([id number]) -------------------------------------------------------------------------------- Vitals Details Patient Name: [name]. Date of Service: 10/21/2021 12:30 PM Medical Record Number: [id number] Patient Account Number: [id number] Date of Birth/Sex: [birth year] (66 y.o. M) Treating RN: [name] Primary Care Karmyn Lowman: [name] Other Clinician: Referring Persais Ethridge: [name] Treating Eloy Fehl/Extender: [name] in Treatment: 7 Vital Signs Time Taken: 12:39 Temperature (F): 98.5 Height (in): 71 Pulse (bpm): 71 Weight (lbs): 295 Respiratory Rate (breaths/min): 18 Body Mass Index (BMI): 41.1 Blood Pressure (mmHg): 102/64 Reference Range: 80 - 120 mg / dl Electronic Signature(s) Signed: 10/23/2021 10:10:35 AM By: [name] Coria RN Entered By: [name] on 10/21/2021 12:45:54

## 2021-10-23 NOTE — Patient Instructions (Signed)
Visit Information  Phone number for Pharmacist: [phone]   Goals Addressed   None     Care Plan : [place]  Updates made by Charlton Haws, RPH since 10/23/2021 12:00 AM     Problem: Hypertension, Hyperlipidemia, Diabetes, Atrial Fibrillation, Heart Failure, Coronary Artery Disease, and COP   Priority: High     Long-Range Goal: Disease mgmt   Start Date: 10/23/2021  Expected End Date: 10/24/2022  This Visit's Progress: On track  Priority: High  Note:   Current Barriers:  Unable to independently afford treatment regimen Unable to maintain control of diabetes  Pharmacist Clinical Goal(s):  Patient will verbalize ability to afford treatment regimen adhere to plan to optimize therapeutic regimen for diabetes as evidenced by report of adherence to recommended medication management changes through collaboration with PharmD and provider.   Interventions: 1:1 collaboration with [name], MD regarding development and update of comprehensive plan of care as evidenced by provider attestation and co-signature Inter-disciplinary care team collaboration (see longitudinal plan of care) Comprehensive medication review performed; medication list updated in electronic medical record  Hypertension / Heart Failure (BP goal <130/80) -Controlled -Last ejection fraction: >55% (Date: 02/2020) -HF type: Diastolic; NYHA Class: II/III -Follows with cardiology (Dr Nehemiah Massed) -Current treatment: Carvedilol 6.25 mg BID - Appropriate, Effective, Safe, Accessible Furosemide 80 mg BID -Appropriate, Effective, Safe, Accessible Metolazone 2.5 mg twice weekly -Appropriate, Effective, Safe, Accessible -Medications previously tried: losartan, metoprolol -Educated on BP goals and benefits of medications for prevention of heart attack, stroke and kidney damage; -Counseled to monitor BP at home daily -Recommended to continue current medication  Atrial Fibrillation (Goal: prevent  stroke and major bleeding) -Not ideally controlled - pt recently switched from Eliquis to warfarin due to cost issues; he has been following closely with PCP warfarin clinic and INR is not yet at goal -Cardioversion 07/2021 -CHADSVASC: 5 -Follows with cardiology (Dr Nehemiah Massed) -Current treatment: Carvedilol 6.25 mg BID - Appropriate, Effective, Safe, Accessible Amiodarone 200 mg daily -Appropriate, Effective, Safe, Accessible Warfarin 5 mg - 1/2 tab daily except 1 tab M/Th -Appropriate, Effective, Query Safe (INR > 3) -Medications previously tried: Eliquis (cost) -Counseled on increased risk of stroke due to Afib and benefits of anticoagulation for stroke prevention; importance of adherence to anticoagulant exactly as prescribed; bleeding risk associated with warfarin and importance of self-monitoring for signs/symptoms of bleeding; avoidance of NSAIDs due to increased bleeding risk with anticoagulants; importance of regular laboratory monitoring; -Recommended to continue current medication  Hyperlipidemia / CAD (LDL goal < 70) -Controlled - LDL 60 (06/2021) at goal -Hx CAD (3v disease on CT scan); also AAA; no aspirin due to warfarin -Current treatment: Simvastatin 20 mg daily HS - Appropriate, Effective, Safe, Accessible -Medications previously tried: n/a  -Educated on Cholesterol goals; Benefits of statin for ASCVD risk reduction; -Recommended to continue current medication  Diabetes (A1c goal <8%) -Uncontrolled - A1c 10.4% (06/2021); he has started watching his diet and home BG appears improved; PCP would like him to be on Farxiga -Current home glucose readings fasting glucose: 156 - 210 -Denies hypoglycemic/hyperglycemic symptoms -Pt does not sleep well, breakfast is sometimes 2pm -Current medications: Metformin 1000 mg BID - Appropriate, Query Effective Glimepiride 4 mg daily -Appropriate, Query Effective One Touch [employer] supplies -Medications previously tried: [place] (cost),  Januvia  -Current meal patterns:  drinks: coffee - sweet n low, nondairy creamer -Current exercise: n/a -Educated on A1c and blood sugar goals; Complications of diabetes including kidney damage, retinal damage, and cardiovascular disease;  Benefits of routine self-monitoring of blood sugar; -Counseled on diet and exercise extensively -Reviewed Farxiga - typically if A1c is > 10% it is recommended to avoid SGLT2i due to greater risk for side effects; fasting BG has improved with diet changes so A1c may be < 10% at this point, it would be reasonable to restart [place] if A1c is < 10% at upcoming CPE (11/02/21), Wilder Glade will be free through [employer] -Recommended to continue current medication  COPD (Goal: control symptoms and prevent exacerbations) -Controlled - pt affirms compliance with inhalers however he is struggling with cost and multiple inhalers -Gold Grade: Gold 3 (FEV1 30-49%) -Current COPD Classification:  D (high sx, >/=2 exacerbations/yr) -Pulmonary function testing: Spirometry 05/2018: pre: FVC 54%, FEV1 51%, ratio 0.71 post: FVC 54%, FEV1 49%, ratio 0.69 -Exacerbations requiring treatment in last 6 months: 0 (last 07/2020) -Current treatment  Incruse Ellipta (umeclidinium) 1 puff daily - Appropriate, Effective, Safe, Query Accessible Wixela (fluticasone-salmeterol) 1 puff BID -Appropriate, Effective, Safe, Query Accessible Atrovent (ipratropium) 2 puffs PRN - Appropriate, Effective, Safe, Accessible Albuterol HFA -Appropriate, Effective, Safe, Accessible Albuterol neb -Appropriate, Effective, Safe, Accessible Montelukast 10 mg daily HS -Appropriate, Effective, Safe, Accessible Flonase nasal spray -Appropriate, Effective, Safe, Accessible -Medications previously tried: Trelegy, Advair, Spiriva -Patient reports consistent use of maintenance inhaler -Counseled on Proper inhaler technique; Benefits of consistent maintenance inhaler use -Recommend to switch Incruse/Wixela to [employer]  (triple therapy) 2 puff BID; pt enrolled in [employer] for free Breztri  Insomnia (Goal: manage symptoms) -Not ideally controlled -Hx OSA on CPAP; he is noncompliant with CPAP due to uncomfortable mask, he has not tried new mask in a few years -Lays down 11-12pm, gets up 9-10 am to let dogs out -Recommend new referral to pulmonary to discuss options  Patient Goals/Self-Care Activities Patient will:  - take medications as prescribed as evidenced by patient report and record review focus on medication adherence by routine check glucose daily, document, and provide at future appointments check blood pressure daily, document, and provide at future appointments       Patient verbalizes understanding of instructions and care plan provided today and agrees to view in [place]. Active MyChart status and patient understanding of how to access instructions and care plan via MyChart confirmed with patient.    Telephone follow up appointment with pharmacy team member scheduled for: 1 month  [name], PharmD, [facility] Clinical Pharmacist [facility] [phone]

## 2021-10-24 ENCOUNTER — Other Ambulatory Visit: Payer: Self-pay | Admitting: Family Medicine

## 2021-10-28 ENCOUNTER — Ambulatory Visit: Payer: Medicare Other | Admitting: Internal Medicine

## 2021-10-28 NOTE — Telephone Encounter (Signed)
Patient is returning call.  °

## 2021-10-29 ENCOUNTER — Ambulatory Visit (INDEPENDENT_AMBULATORY_CARE_PROVIDER_SITE_OTHER): Payer: Medicare Other

## 2021-10-29 ENCOUNTER — Other Ambulatory Visit: Payer: Self-pay | Admitting: Family Medicine

## 2021-10-29 DIAGNOSIS — Z7901 Long term (current) use of anticoagulants: Secondary | ICD-10-CM | POA: Diagnosis not present

## 2021-10-29 LAB — POCT INR: INR: 3.7 — AB (ref 2.0–3.0)

## 2021-10-29 NOTE — Telephone Encounter (Signed)
Pt was in today for coumadin clinic apt. Gave pt msg and phone number to call to f/u on inhaler. Pt appreciative for everyone's help.

## 2021-10-29 NOTE — Progress Notes (Addendum)
Pt reported he has been taking 1 tablet every other day and maybe once or twice he took 1 tablet a couple of days in a row. Pt reported he could not find his pill splitter and thought it would be the same as the instructions given at the last apt. Educated pt on importance of following dosing instructions. Pt verbalized understanding.  Unsure exactly how much pt was taking weekly there have been a decrease to pt's weekly dose.  Hold dose today and then change weekly dose to take 1/2 tablet daily except take nothing on Wednesdays. Recheck in 2 week.

## 2021-10-29 NOTE — Patient Instructions (Addendum)
Pre visit review using our clinic review tool, if applicable. No additional management support is needed unless otherwise documented below in the visit note.  Hold dose today and then change weekly dose to take 1/2 tablet daily except take nothing on Wednesdays. Recheck in 2 week.

## 2021-10-29 NOTE — Progress Notes (Signed)
Called patient to inform him about the change in his inhaler to [facility]. Prescription was sent to [facility] by Dr. Danise Mina [phone]). No answer; left message.   [name], CPP notified  Marijean Niemann, [place] Clinical Pharmacy Assistant [phone]

## 2021-10-30 ENCOUNTER — Telehealth: Payer: Self-pay

## 2021-10-30 ENCOUNTER — Other Ambulatory Visit: Payer: Self-pay | Admitting: Family Medicine

## 2021-10-30 ENCOUNTER — Inpatient Hospital Stay: Payer: Medicare Other | Attending: Oncology

## 2021-10-30 VITALS — BP 107/67 | HR 69 | Temp 97.7°F | Resp 17

## 2021-10-30 DIAGNOSIS — D508 Other iron deficiency anemias: Secondary | ICD-10-CM | POA: Insufficient documentation

## 2021-10-30 MED ORDER — SODIUM CHLORIDE 0.9 % IV SOLN
Freq: Once | INTRAVENOUS | Status: AC
  Filled 2021-10-30: qty 250

## 2021-10-30 MED ORDER — SODIUM CHLORIDE 0.9 % IV SOLN
510.0000 mg | INTRAVENOUS | Status: DC
  Administered 2021-10-30: 510 mg via INTRAVENOUS
  Filled 2021-10-30: qty 17

## 2021-10-30 NOTE — Telephone Encounter (Signed)
Spoke with pt and he wants a refill for Incruse until he hears from [place] about PAP for Trelegy.   Spoke with [facility] asking if any refills on file.  Confirms pt has refills available and will fill for pt.

## 2021-10-30 NOTE — Telephone Encounter (Signed)
Albuterol neb Last filled:  09/05/21, #150 L Last OV: 09/08/21, panic attack Next OV: 11/02/21, CPE

## 2021-10-30 NOTE — Telephone Encounter (Signed)
Patient is completley out of Incruse and wants to know what he needs to do for the weekend.

## 2021-10-30 NOTE — Progress Notes (Signed)
First of all, thank you to everyone for your help with this. There seems to be some confusion. The patient has been approved for [facility] through [place] patient assistance (not Trelegy). I called the patient on 10/29/21 to inform him about the change in inhalers and to let him know that the prescription had been sent. Patient did not answer. I left a voicemail with the phone number of the pharmacy to contact for delivery updates. Patient had a Coumadin Clinic appointment the following day (10/29/21) in which Randall An, RN relayed the message (from the encounter notes dates 10/22/2021) and phone number to the patient. I tried calling patient today (10/30/21) to clear up the confusion, but did not get a hold of him. I did leave a detail voicemail and gave him [facility] phone number for shipping updates.   I called [facility] for a shipping updated. They are shipping his medication out today. They did not have a delivery day, but did give me a tracking number through [employer]PS: [phone]. I tracked the packaged and the shipping has not been updated as of this afternoon.   [name], CPP notified  Marijean Niemann, [place] Clinical Pharmacy Assistant [phone]

## 2021-10-30 NOTE — Patient Instructions (Signed)
[facility]  Discharge Instructions: Thank you for choosing [facility] to provide your oncology and hematology care.  If you have a lab appointment with the Cancer Center, please go directly to the Cancer Center and check in at the registration area.  Wear comfortable clothing and clothing appropriate for easy access to any Portacath or PICC line.   We strive to give you quality time with your provider. You may need to reschedule your appointment if you arrive late (15 or more minutes).  Arriving late affects you and other patients whose appointments are after yours.  Also, if you miss three or more appointments without notifying the office, you may be dismissed from the clinic at the provider's discretion.      For prescription refill requests, have your pharmacy contact our office and allow 72 hours for refills to be completed.    Today you received the following chemotherapy and/or immunotherapy agents Feraheme.      To help prevent nausea and vomiting after your treatment, we encourage you to take your nausea medication as directed.  BELOW ARE SYMPTOMS THAT SHOULD BE REPORTED IMMEDIATELY: *FEVER GREATER THAN 100.4 F (38 C) OR HIGHER *CHILLS OR SWEATING *NAUSEA AND VOMITING THAT IS NOT CONTROLLED WITH YOUR NAUSEA MEDICATION *UNUSUAL SHORTNESS OF BREATH *UNUSUAL BRUISING OR BLEEDING *URINARY PROBLEMS (pain or burning when urinating, or frequent urination) *BOWEL PROBLEMS (unusual diarrhea, constipation, pain near the anus) TENDERNESS IN MOUTH AND THROAT WITH OR WITHOUT PRESENCE OF ULCERS (sore throat, sores in mouth, or a toothache) UNUSUAL RASH, SWELLING OR PAIN  UNUSUAL VAGINAL DISCHARGE OR ITCHING   Items with * indicate a potential emergency and should be followed up as soon as possible or go to the Emergency Department if any problems should occur.  Please show the CHEMOTHERAPY ALERT CARD or IMMUNOTHERAPY ALERT CARD at check-in to  the Emergency Department and triage nurse.  Should you have questions after your visit or need to cancel or reschedule your appointment, please contact [facility]  [phone] and follow the prompts.  Office hours are 8:00 a.m. to 4:30 p.m. Monday - Friday. Please note that voicemails left after 4:00 p.m. may not be returned until the following business day.  We are closed weekends and major holidays. You have access to a nurse at all times for urgent questions. Please call the main number to the clinic [phone] and follow the prompts.  For any non-urgent questions, you may also contact your provider using MyChart. We now offer e-Visits for anyone 18 and older to request care online for non-urgent symptoms. For details visit [email or web address].   Also download the MyChart app! Go to the app store, search "MyChart", open the app, select [facility], and log in with your MyChart username and password.  Masks are optional in the cancer centers. If you would like for your care team to wear a mask while they are taking care of you, please let them know. For doctor visits, patients may have with them one support person who is at least 66 years old. At this time, visitors are not allowed in the infusion area.   

## 2021-10-30 NOTE — Telephone Encounter (Signed)
Could send in 1 inhaler of spiriva or wixela (whichever is more affordable) while he gets started on Trelegy. Which was cheaper option?

## 2021-10-30 NOTE — Telephone Encounter (Signed)
Patient is calling in stating that he was prescribed Trelegy Inhaler, due to it being so expensive he didn't pick it up. Was working with Mendel Ryder about this and was approved for help but hasnt heard back about getting the inhaler and wants to know the next steps. Asked if someone can reach out to him in regards to this, Mendel Ryder is out of the office until Monday and I did advise patient this but he is desperately is needing help with this.

## 2021-11-02 ENCOUNTER — Encounter: Payer: Medicare Other | Admitting: Family Medicine

## 2021-11-04 ENCOUNTER — Ambulatory Visit: Payer: Medicare Other | Admitting: Internal Medicine

## 2021-11-09 ENCOUNTER — Telehealth: Payer: Self-pay

## 2021-11-09 NOTE — Progress Notes (Signed)
Entered in Error

## 2021-11-09 NOTE — Telephone Encounter (Signed)
Pt called in requesting a call back regarding medication concern . F/u with inhaler  [phone]

## 2021-11-09 NOTE — Progress Notes (Signed)
Returned patient's call; no answer; left message.   [name], CPP notified  Marijean Niemann, [place] Clinical Pharmacy Assistant [phone]

## 2021-11-10 ENCOUNTER — Other Ambulatory Visit: Payer: Self-pay

## 2021-11-10 ENCOUNTER — Emergency Department
Admission: EM | Admit: 2021-11-10 | Discharge: 2021-11-10 | Disposition: A | Discharge status: Home / Self Care | Payer: Medicare Other | Attending: Emergency Medicine | Admitting: Emergency Medicine

## 2021-11-10 ENCOUNTER — Emergency Department: Payer: Medicare Other

## 2021-11-10 ENCOUNTER — Encounter: Payer: Self-pay | Admitting: Emergency Medicine

## 2021-11-10 DIAGNOSIS — S0083XA Contusion of other part of head, initial encounter: Secondary | ICD-10-CM | POA: Insufficient documentation

## 2021-11-10 DIAGNOSIS — E119 Type 2 diabetes mellitus without complications: Secondary | ICD-10-CM | POA: Insufficient documentation

## 2021-11-10 DIAGNOSIS — S0990XA Unspecified injury of head, initial encounter: Secondary | ICD-10-CM | POA: Diagnosis not present

## 2021-11-10 DIAGNOSIS — I110 Hypertensive heart disease with heart failure: Secondary | ICD-10-CM | POA: Insufficient documentation

## 2021-11-10 DIAGNOSIS — S50812A Abrasion of left forearm, initial encounter: Secondary | ICD-10-CM | POA: Insufficient documentation

## 2021-11-10 DIAGNOSIS — S0081XA Abrasion of other part of head, initial encounter: Secondary | ICD-10-CM | POA: Diagnosis not present

## 2021-11-10 DIAGNOSIS — R0902 Hypoxemia: Secondary | ICD-10-CM | POA: Diagnosis not present

## 2021-11-10 DIAGNOSIS — W1839XA Other fall on same level, initial encounter: Secondary | ICD-10-CM | POA: Insufficient documentation

## 2021-11-10 DIAGNOSIS — W19XXXA Unspecified fall, initial encounter: Secondary | ICD-10-CM

## 2021-11-10 DIAGNOSIS — S0001XA Abrasion of scalp, initial encounter: Secondary | ICD-10-CM | POA: Diagnosis not present

## 2021-11-10 DIAGNOSIS — Z043 Encounter for examination and observation following other accident: Secondary | ICD-10-CM | POA: Diagnosis not present

## 2021-11-10 DIAGNOSIS — M47812 Spondylosis without myelopathy or radiculopathy, cervical region: Secondary | ICD-10-CM | POA: Diagnosis not present

## 2021-11-10 DIAGNOSIS — I509 Heart failure, unspecified: Secondary | ICD-10-CM | POA: Diagnosis not present

## 2021-11-10 DIAGNOSIS — T07XXXA Unspecified multiple injuries, initial encounter: Secondary | ICD-10-CM

## 2021-11-10 DIAGNOSIS — I2510 Atherosclerotic heart disease of native coronary artery without angina pectoris: Secondary | ICD-10-CM | POA: Insufficient documentation

## 2021-11-10 DIAGNOSIS — Z23 Encounter for immunization: Secondary | ICD-10-CM | POA: Insufficient documentation

## 2021-11-10 DIAGNOSIS — J449 Chronic obstructive pulmonary disease, unspecified: Secondary | ICD-10-CM | POA: Insufficient documentation

## 2021-11-10 DIAGNOSIS — Y92000 Kitchen of unspecified non-institutional (private) residence as  the place of occurrence of the external cause: Secondary | ICD-10-CM | POA: Diagnosis not present

## 2021-11-10 MED ORDER — TETANUS-DIPHTH-ACELL PERTUSSIS 5-2.5-18.5 LF-MCG/0.5 IM SUSY
0.5000 mL | PREFILLED_SYRINGE | Freq: Once | INTRAMUSCULAR | Status: AC
  Administered 2021-11-10: 0.5 mL via INTRAMUSCULAR
  Filled 2021-11-10: qty 0.5

## 2021-11-10 NOTE — ED Provider Notes (Signed)
[facility] Provider Note    Event Date/Time   First MD Initiated Contact with Patient 11/10/21 1436     (approximate)   History   Fall   HPI  [name] is a 66 y.o. male   presents to the ED with complaint of a fall and injury to his left forearm and contusion to his forehead.  Patient states that he has a history of sleep apnea and narcolepsy.  Patient states that currently his CPAP machine is broken so he has not been able to use it which is made him falling asleep much easier.  Patient also has history of hypertension, CAD, AAA without rupture, atrial fibs, COPD, diabetes, anxiety, angina, CHF, cardiomyopathy.      Physical Exam   Triage Vital Signs: ED Triage Vitals  Enc Vitals Group     BP 11/10/21 1216 128/75     Pulse Rate 11/10/21 1216 61     Resp 11/10/21 1216 18     Temp 11/10/21 1216 98.1 F (36.7 C)     Temp Source 11/10/21 1216 Oral     SpO2 11/10/21 1216 91 %     Weight 11/10/21 1217 290 lb (131.5 kg)     Height 11/10/21 1217 '5\' 11"'$  (1.803 m)     Head Circumference --      Peak Flow --      Pain Score 11/10/21 1217 0     Pain Loc --      Pain Edu? --      Excl. in [place]? --     Most recent vital signs: Vitals:   11/10/21 1216  BP: 128/75  Pulse: 61  Resp: 18  Temp: 98.1 F (36.7 C)  SpO2: 91%     General: Awake, no distress.  Patient was noted to be standing in the hallway waiting to be seen.  He was ambulatory without any assistance.  No difficulty with gait noted. CV:  Good peripheral perfusion.  Resp:  Normal effort.  Abd:  No distention.  Other:  Small soft tissue tenderness noted in the left forehead area but no interruption in skin or bleeding noted.  Minimal tenderness on palpation of the cervical spine but no bruising or soft tissue edema is present.  Patient is able to move upper extremities without any difficulty however there is noted multiple superficial abrasions without active bleeding on the left  forearm.  No foreign bodies are noted.  No injury noted to the lower extremities.   ED Results / Procedures / Treatments   Labs (all labs ordered are listed, but only abnormal results are displayed) Labs Reviewed - No data to display   RADIOLOGY CT head and cervical spine per radiologist were negative for any acute changes.  No acute intracranial changes.  C-spine showed multilevel degenerative changes but no acute fracture.  Chest x-ray per radiologist was negative for acute disease.    PROCEDURES:  Critical Care performed:   Procedures   MEDICATIONS ORDERED IN ED: Medications  Tdap (BOOSTRIX) injection 0.5 mL (0.5 mLs Intramuscular Given 11/10/21 1510)     IMPRESSION / MDM / ASSESSMENT AND PLAN / ED COURSE  I reviewed the triage vital signs and the nursing notes.   Differential diagnosis includes, but is not limited to, head injury, cerebral hemorrhage, cervical spine injury, abrasions left upper extremity, sleep apnea and narcolepsy increasing patient's risk for fall and injury.  66 year old male presents to the ED after a fall in his home  in which he states he was fell asleep while standing in the kitchen.  Patient has abrasions to his left forearm and also small area to his left forehead.  Patient denies any loss of consciousness but does take blood thinners.  Patient denies any vision changes, nausea or vomiting.  CT head and cervical spine were negative for any acute changes.  Chest x-ray was negative for acute disease however patient does have COPD.  We discussed in length that patient needs to be reevaluated by a sleep disorder specialist.  The CPAP machine that he did have was several years old and that he would need to be reevaluated and that there are several new machines on the market that would not make him claustrophobic and cover his entire face.  Patient was referred to Dr. Margit Hanks, which family perfers for him to see for his sleep apnea.  Patient was given instructions  on how to care for his abrasions and also to watch for any signs of infection.  Tdap was given while in the ED.      Patient's presentation is most consistent with acute presentation with potential threat to life or bodily function.  FINAL CLINICAL IMPRESSION(S) / ED DIAGNOSES   Final diagnoses:  Contusion of forehead, initial encounter  Abrasions of multiple sites  Fall, initial encounter     Rx / DC Orders   ED Discharge Orders     None        Note:  This document was prepared using Dragon voice recognition software and may include unintentional dictation errors.   Johnn Hai, PA-C 11/10/21 1624    Lucrezia Starch, MD 11/10/21 [phone]

## 2021-11-10 NOTE — Discharge Instructions (Signed)
Call to make an appointment with Dr. Mortimer Fries for an appointment for your pulmonary issues, sleep apnea.  Watch abrasions to your left arm for infection.  Clean area daily with mild soap and water and allowed to dry completely.  Ice to your forehead if needed for swelling or pain.  Continue with your regular medication.

## 2021-11-10 NOTE — ED Triage Notes (Signed)
Patient to ED via POV for fall. Patient states he fell asleep standing up and fell in kitchen. Skin tare noted on left arm and abrasion on left forehead. Patient denies LOC but states that he does take blood thinners. Aox4 in triage.

## 2021-11-12 ENCOUNTER — Ambulatory Visit (INDEPENDENT_AMBULATORY_CARE_PROVIDER_SITE_OTHER): Payer: Medicare Other

## 2021-11-12 DIAGNOSIS — Z7901 Long term (current) use of anticoagulants: Secondary | ICD-10-CM | POA: Diagnosis not present

## 2021-11-12 LAB — POCT INR: INR: 2.9 (ref 2.0–3.0)

## 2021-11-12 NOTE — Patient Instructions (Addendum)
Pre visit review using our clinic review tool, if applicable. No additional management support is needed unless otherwise documented below in the visit note.  Continue 1/2 tablet daily except take nothing on Wednesdays. Recheck in 2 week.

## 2021-11-12 NOTE — Progress Notes (Addendum)
In ER due to narcolepsy. Contusion of forehead and forearm. Per pt's friend, pt falls asleep standing up. Pt needs ER f/u. Pt was scheduled for f/u to discuss narcolepsy/CPAP supplies. Pt was also given handicap sticker paperwork to complete and bring to apt with him on 7/28.  Continue 1/2 tablet daily except take nothing on Wednesdays. Recheck in 2 week.

## 2021-11-18 ENCOUNTER — Telehealth: Payer: Self-pay

## 2021-11-18 NOTE — Progress Notes (Signed)
Chronic Care Management Pharmacy Assistant   Name: [name]  MRN: [id number] DOB: 03-Aug-1955  Reason for Encounter: CCM (Appointment Reminder)  Medications: Outpatient Encounter Medications as of 11/18/2021  Medication Sig   ACCU-CHEK FASTCLIX LANCETS MISC Check blood sugar once daily and as instructed. Dx 250.00   albuterol (PROVENTIL) (2.5 MG/3ML) 0.083% nebulizer solution INHALE 3 ML BY NEBULIZATION EVERY 6 HOURS AS NEEDED FOR WHEEZING OR SHORTNESS OF BREATH   albuterol (VENTOLIN HFA) 108 (90 Base) MCG/ACT inhaler INHALE 2 PUFFS BY MOUTH EVERY 6 HOURS AS NEEDED FOR WHEEZE OR SHORTNESS OF BREATH   amiodarone (PACERONE) 200 MG tablet Take 1 tablet (200 mg total) by mouth daily.   Blood Glucose Monitoring Suppl (BLOOD GLUCOSE MONITOR SYSTEM) W/DEVICE KIT by Does not apply route. Use to check sugar once daily and as needed Dx: E11.9 **ONE TOUCH VERIO**   Budeson-Glycopyrrol-Formoterol (BREZTRI AEROSPHERE) 160-9-4.8 MCG/ACT AERO Inhale 2 puffs into the lungs 2 (two) times daily.   carvedilol (COREG) 6.25 MG tablet TAKE 1 TABLET BY MOUTH 2 TIMES DAILY WITH A MEAL.   Cholecalciferol (VITAMIN D) 50 MCG (2000 UT) CAPS Take 1 capsule (2,000 Units total) by mouth daily. (Patient taking differently: Take 2,000 Units by mouth daily.)   citalopram (CELEXA) 10 MG tablet Take 1 tablet (10 mg total) by mouth daily.   Docusate Calcium (STOOL SOFTENER PO) Take 1 tablet by mouth daily.   fluticasone (FLONASE) 50 MCG/ACT nasal spray SPRAY 2 SPRAYS INTO EACH NOSTRIL EVERY DAY (Patient taking differently: Place 1 spray into both nostrils daily as needed for allergies.)   furosemide (LASIX) 80 MG tablet Take 1 tablet (80 mg total) by mouth 2 (two) times daily.   glimepiride (AMARYL) 4 MG tablet TAKE 1 TABLET BY MOUTH EVERY DAY WITH BREAKFAST   glucose blood (ONETOUCH VERIO) test strip Check blood sugar 3 times a day   hydrOXYzine (ATARAX) 50 MG tablet TAKE 1 TABLET (50 MG TOTAL) BY MOUTH 2 (TWO) TIMES  DAILY AS NEEDED FOR ANXIETY.   ipratropium (ATROVENT HFA) 17 MCG/ACT inhaler Inhale 2 puffs into the lungs every 6 (six) hours as needed for wheezing.   metFORMIN (GLUCOPHAGE) 1000 MG tablet TAKE 1 TABLET (1,000 MG TOTAL) BY MOUTH 2 (TWO) TIMES DAILY WITH A MEAL.   metolazone (ZAROXOLYN) 2.5 MG tablet Take 1 tablet (2.5 mg total) by mouth 2 (two) times a week.   montelukast (SINGULAIR) 10 MG tablet TAKE 1 TABLET BY MOUTH EVERYDAY AT BEDTIME   NON FORMULARY 1,200 mg daily. SEA Evanell Redlich   NON FORMULARY daily. SUPER BEETS   NON FORMULARY 500 mg once. TURMERIC   pantoprazole (PROTONIX) 40 MG tablet TAKE 1 TABLET (40 MG TOTAL) BY MOUTH 2 (TWO) TIMES DAILY BEFORE A MEAL.   Probiotic Product (PROBIOTIC DAILY PO) Take 1 tablet by mouth daily.   simvastatin (ZOCOR) 20 MG tablet TAKE 1 TABLET BY MOUTH EVERY DAY IN THE EVENING   triamcinolone cream (KENALOG) 0.1 % Apply 1 application. topically 2 (two) times daily. Apply to AA. (Patient taking differently: Apply 1 application  topically 3 (three) times daily. Apply to AA.)   vitamin B-12 (CYANOCOBALAMIN) 1000 MCG tablet Take 1 tablet (1,000 mcg total) by mouth every Monday, Wednesday, and Friday. (Patient taking differently: Take 1,000 mcg by mouth daily.)   warfarin (COUMADIN) 5 MG tablet TAKE 1 TABLET BY MOUTH DAILY OR AS DIRECTED BY ANTICOAGULATION CLINIC   Facility-Administered Encounter Medications as of 11/18/2021  Medication   ferumoxytol (FERAHEME) 510 mg  in sodium chloride 0.9 % 100 mL IVPB   Hong P Wittmeyer was contacted to remind of upcoming telephone visit with Lindsey Foltanski  on 11/25/2021 at 1:30. Patient was reminded to have any blood glucose and blood pressure readings available for review at appointment.   Message was left reminding patient of appointment.   CCM referral has been placed prior to visit?  Yes    Star Rating Drugs: Medication:  Last Fill: Day Supply Metformin 1000 mg 10/26/2021 90 Simvastatin 20  mg 09/28/2021 90  Lindsey Foltanski, CPP notified  [name] Moss, RMA Clinical Pharmacy Assistant [phone]      

## 2021-11-18 NOTE — Progress Notes (Addendum)
Chronic Care Management Pharmacy Assistant   Name: [name]  MRN: [id number] DOB: 05-27-1955  Reason for Encounter: CCM (Hosptial Follow Up)  Medications: Outpatient Encounter Medications as of 11/18/2021  Medication Sig   ACCU-CHEK FASTCLIX LANCETS MISC Check blood sugar once daily and as instructed. Dx 250.00   albuterol (PROVENTIL) (2.5 MG/3ML) 0.083% nebulizer solution INHALE 3 ML BY NEBULIZATION EVERY 6 HOURS AS NEEDED FOR WHEEZING OR SHORTNESS OF BREATH   albuterol (VENTOLIN HFA) 108 (90 Base) MCG/ACT inhaler INHALE 2 PUFFS BY MOUTH EVERY 6 HOURS AS NEEDED FOR WHEEZE OR SHORTNESS OF BREATH   amiodarone (PACERONE) 200 MG tablet Take 1 tablet (200 mg total) by mouth daily.   Blood Glucose Monitoring Suppl (BLOOD GLUCOSE MONITOR SYSTEM) W/DEVICE KIT by Does not apply route. Use to check sugar once daily and as needed Dx: E11.9 **ONE TOUCH VERIO**   Budeson-Glycopyrrol-Formoterol (BREZTRI AEROSPHERE) 160-9-4.8 MCG/ACT AERO Inhale 2 puffs into the lungs 2 (two) times daily.   carvedilol (COREG) 6.25 MG tablet TAKE 1 TABLET BY MOUTH 2 TIMES DAILY WITH A MEAL.   Cholecalciferol (VITAMIN D) 50 MCG (2000 UT) CAPS Take 1 capsule (2,000 Units total) by mouth daily. (Patient taking differently: Take 2,000 Units by mouth daily.)   citalopram (CELEXA) 10 MG tablet Take 1 tablet (10 mg total) by mouth daily.   Docusate Calcium (STOOL SOFTENER PO) Take 1 tablet by mouth daily.   fluticasone (FLONASE) 50 MCG/ACT nasal spray SPRAY 2 SPRAYS INTO EACH NOSTRIL EVERY DAY (Patient taking differently: Place 1 spray into both nostrils daily as needed for allergies.)   furosemide (LASIX) 80 MG tablet Take 1 tablet (80 mg total) by mouth 2 (two) times daily.   glimepiride (AMARYL) 4 MG tablet TAKE 1 TABLET BY MOUTH EVERY DAY WITH BREAKFAST   glucose blood (ONETOUCH VERIO) test strip Check blood sugar 3 times a day   hydrOXYzine (ATARAX) 50 MG tablet TAKE 1 TABLET (50 MG TOTAL) BY MOUTH 2 (TWO) TIMES  DAILY AS NEEDED FOR ANXIETY.   ipratropium (ATROVENT HFA) 17 MCG/ACT inhaler Inhale 2 puffs into the lungs every 6 (six) hours as needed for wheezing.   metFORMIN (GLUCOPHAGE) 1000 MG tablet TAKE 1 TABLET (1,000 MG TOTAL) BY MOUTH 2 (TWO) TIMES DAILY WITH A MEAL.   metolazone (ZAROXOLYN) 2.5 MG tablet Take 1 tablet (2.5 mg total) by mouth 2 (two) times a week.   montelukast (SINGULAIR) 10 MG tablet TAKE 1 TABLET BY MOUTH EVERYDAY AT BEDTIME   NON FORMULARY 1,200 mg daily. SEA [name]   NON FORMULARY daily. SUPER BEETS   NON FORMULARY 500 mg once. TURMERIC   pantoprazole (PROTONIX) 40 MG tablet TAKE 1 TABLET (40 MG TOTAL) BY MOUTH 2 (TWO) TIMES DAILY BEFORE A MEAL.   Probiotic Product (PROBIOTIC DAILY PO) Take 1 tablet by mouth daily.   simvastatin (ZOCOR) 20 MG tablet TAKE 1 TABLET BY MOUTH EVERY DAY IN THE EVENING   triamcinolone cream (KENALOG) 0.1 % Apply 1 application. topically 2 (two) times daily. Apply to AA. (Patient taking differently: Apply 1 application  topically 3 (three) times daily. Apply to AA.)   vitamin B-12 (CYANOCOBALAMIN) 1000 MCG tablet Take 1 tablet (1,000 mcg total) by mouth every Monday, Wednesday, and Friday. (Patient taking differently: Take 1,000 mcg by mouth daily.)   warfarin (COUMADIN) 5 MG tablet TAKE 1 TABLET BY MOUTH DAILY OR AS DIRECTED BY ANTICOAGULATION CLINIC   Facility-Administered Encounter Medications as of 11/18/2021  Medication   ferumoxytol (FERAHEME) 510  mg in sodium chloride 0.9 % 100 mL IVPB   Reviewed hospital notes for details of recent visit. Patient has been contacted by Transitions of Care team: No  Admitted to the ED on 11/10/2021. Discharge date was 11/10/2021.  Discharged from [facility].   Discharge diagnosis (Principal Problem): Contusion of forehead Patient was discharged to Home  Brief summary of hospital course:  66 year old male presents to the ED after a fall in his home in which he states he was fell asleep while  standing in the kitchen.  Patient has abrasions to his left forearm and also small area to his left forehead.  Patient denies any loss of consciousness but does take blood thinners.  Patient denies any vision changes, nausea or vomiting.  CT head and cervical spine were negative for any acute changes.  Chest x-ray was negative for acute disease however patient does have COPD.  We discussed in length that patient needs to be reevaluated by a sleep disorder specialist.  The CPAP machine that he did have was several years old and that he would need to be reevaluated and that there are several new machines on the market that would not make him claustrophobic and cover his entire face.  Patient was referred to Dr. Margit Hanks, which family perfers for him to see for his sleep apnea.  Patient was given instructions on how to care for his abrasions and also to watch for any signs of infection.  Tdap was given while in the ED.  Medications that remain the same after Hospital Discharge:??  -All other medications will remain the same.    Next CCM appt: 11/25/2021  Other upcoming appts: Echocardiogram appointment on 11/19/2021 PCP appointment on 11/20/2021 for hospital follow up PCP appointment on 01/26/2022 for physical  [name], PharmD notified and will determine if action is needed.

## 2021-11-19 ENCOUNTER — Ambulatory Visit (INDEPENDENT_AMBULATORY_CARE_PROVIDER_SITE_OTHER): Payer: Medicare Other

## 2021-11-19 DIAGNOSIS — I4819 Other persistent atrial fibrillation: Secondary | ICD-10-CM

## 2021-11-19 LAB — ECHOCARDIOGRAM COMPLETE
AR max vel: 3.79 cm2
AV Area VTI: 4.31 cm2
AV Area mean vel: 3.85 cm2
AV Mean grad: 3 mmHg
AV Peak grad: 5.3 mmHg
Ao pk vel: 1.15 m/s
Area-P 1/2: 3.42 cm2
S' Lateral: 2.7 cm

## 2021-11-19 MED ORDER — PERFLUTREN LIPID MICROSPHERE
1.0000 mL | INTRAVENOUS | Status: AC | PRN
  Administered 2021-11-19: 2 mL via INTRAVENOUS

## 2021-11-20 ENCOUNTER — Other Ambulatory Visit: Payer: Self-pay | Admitting: Family Medicine

## 2021-11-20 ENCOUNTER — Ambulatory Visit (INDEPENDENT_AMBULATORY_CARE_PROVIDER_SITE_OTHER): Payer: Medicare Other | Admitting: Family Medicine

## 2021-11-20 ENCOUNTER — Encounter: Payer: Self-pay | Admitting: Family Medicine

## 2021-11-20 ENCOUNTER — Ambulatory Visit (INDEPENDENT_AMBULATORY_CARE_PROVIDER_SITE_OTHER)
Admission: RE | Admit: 2021-11-20 | Discharge: 2021-11-20 | Disposition: A | Discharge status: Home / Self Care | Payer: Medicare Other | Source: Ambulatory Visit | Attending: Family Medicine | Admitting: Family Medicine

## 2021-11-20 VITALS — BP 134/70 | HR 75 | Temp 97.9°F | Ht 71.0 in | Wt 294.5 lb

## 2021-11-20 DIAGNOSIS — R0689 Other abnormalities of breathing: Secondary | ICD-10-CM | POA: Diagnosis not present

## 2021-11-20 DIAGNOSIS — I4819 Other persistent atrial fibrillation: Secondary | ICD-10-CM | POA: Diagnosis not present

## 2021-11-20 DIAGNOSIS — M79602 Pain in left arm: Secondary | ICD-10-CM

## 2021-11-20 DIAGNOSIS — E1169 Type 2 diabetes mellitus with other specified complication: Secondary | ICD-10-CM

## 2021-11-20 DIAGNOSIS — J449 Chronic obstructive pulmonary disease, unspecified: Secondary | ICD-10-CM

## 2021-11-20 DIAGNOSIS — G4733 Obstructive sleep apnea (adult) (pediatric): Secondary | ICD-10-CM | POA: Diagnosis not present

## 2021-11-20 DIAGNOSIS — R296 Repeated falls: Secondary | ICD-10-CM | POA: Diagnosis not present

## 2021-11-20 DIAGNOSIS — M25522 Pain in left elbow: Secondary | ICD-10-CM | POA: Diagnosis not present

## 2021-11-20 LAB — CBC WITH DIFFERENTIAL/PLATELET
Basophils Absolute: 0.1 10*3/uL (ref 0.0–0.1)
Basophils Relative: 0.5 % (ref 0.0–3.0)
Eosinophils Absolute: 0.1 10*3/uL (ref 0.0–0.7)
Eosinophils Relative: 0.8 % (ref 0.0–5.0)
HCT: 39.6 % (ref 39.0–52.0)
Hemoglobin: 11.8 g/dL — ABNORMAL LOW (ref 13.0–17.0)
Lymphocytes Relative: 12 % (ref 12.0–46.0)
Lymphs Abs: 1.2 10*3/uL (ref 0.7–4.0)
MCHC: 30.3 g/dL (ref 30.0–36.0)
MCV: 80.7 fl (ref 78.0–100.0)
Monocytes Absolute: 1.2 10*3/uL — ABNORMAL HIGH (ref 0.1–1.0)
Monocytes Relative: 11.4 % (ref 3.0–12.0)
Neutro Abs: 7.8 10*3/uL — ABNORMAL HIGH (ref 1.4–7.7)
Neutrophils Relative %: 75.3 % (ref 43.0–77.0)
Platelets: 249 10*3/uL (ref 150.0–400.0)
RBC: 4.91 Mil/uL (ref 4.22–5.81)
RDW: 27.2 % — ABNORMAL HIGH (ref 11.5–15.5)
WBC: 10.4 10*3/uL (ref 4.0–10.5)

## 2021-11-20 LAB — BASIC METABOLIC PANEL
BUN: 11 mg/dL (ref 6–23)
CO2: 42 mEq/L — ABNORMAL HIGH (ref 19–32)
Calcium: 9.5 mg/dL (ref 8.4–10.5)
Chloride: 93 mEq/L — ABNORMAL LOW (ref 96–112)
Creatinine, Ser: 0.75 mg/dL (ref 0.40–1.50)
GFR: 94.25 mL/min (ref >60.00)
Glucose, Bld: 181 mg/dL — ABNORMAL HIGH (ref 70–99)
Potassium: 4.2 mEq/L (ref 3.5–5.1)
Sodium: 140 mEq/L (ref 135–145)

## 2021-11-20 LAB — C-REACTIVE PROTEIN: CRP: 4.1 mg/dL (ref 0.5–20.0)

## 2021-11-20 LAB — SEDIMENTATION RATE: Sed Rate: 27 mm/hr — ABNORMAL HIGH (ref 0–20)

## 2021-11-20 LAB — URIC ACID: Uric Acid, Serum: 3.8 mg/dL — ABNORMAL LOW (ref 4.0–7.8)

## 2021-11-20 MED ORDER — CEPHALEXIN 500 MG PO CAPS: 500.0000 mg | ORAL_CAPSULE | Freq: Four times a day (QID) | ORAL | 0 refills | Status: DC

## 2021-11-20 NOTE — Patient Instructions (Addendum)
Labs today  Xrays today  Start keflex antibiotic sent to pharmacy.   Regarding CPAP machine - contact sleep supply store [facility] at:

## 2021-11-20 NOTE — Progress Notes (Unsigned)
Patient ID: [name], male    DOB: [birth year], 66 y.o.   MRN: [id number]  This visit was conducted in person.  BP 134/70   Pulse 75   Temp 97.9 F (36.6 C) (Temporal)   Ht _0  (1.803 m)   Wt 294 lb 8 oz (133.6 kg)   SpO2 93%   BMI 41.07 kg/m    CC: ER f/u visit  Subjective:   HPI: [name] is a 66 y.o. male presenting on 11/20/2021 for Hospitalization Follow-up (Seen on 11/10/21 at [facility] ED, dx fall; contusion of forehead; abrasions of multiple sites. C/o L arm pain.  )   Recent ER visit 11/10/2021 for fall sustained at home associated with left forearm injury and forehead contusion. CT head /C spine reassuringly negative for fracture.  Recently CPAP machine not working well (see below) - poor sleeping due to this.  Tdap updated.  Referred to Dr Mortimer Fries for further evaluation of sleep apnea.   Ongoing falls at home - 2 dozen times in the past 2 weeks.  Most recently fell this morning while waiting for dogs to come in - fell asleep.  Has fallen out of bed when he turned over.   Yesterday had echocardiogram done, his left arm/elbow started hurting last night.  No h/o gout.   Previously untreated OSA - in the last few months has restarted using CPAP machine hoewever mask does not stay on. Previously used [facility] - needs new DME.   Trouble tolerating leg wraps - he decided to stop seeing wound clinic.      Relevant past medical, surgical, family and social history reviewed and updated as indicated. Interim medical history since our last visit reviewed. Allergies and medications reviewed and updated. Outpatient Medications Prior to Visit  Medication Sig Dispense Refill   ACCU-CHEK FASTCLIX LANCETS MISC Check blood sugar once daily and as instructed. Dx 250.00 100 each 3   albuterol (PROVENTIL) (2.5 MG/3ML) 0.083% nebulizer solution INHALE 3 ML BY NEBULIZATION EVERY 6 HOURS AS NEEDED FOR WHEEZING OR SHORTNESS OF BREATH 150 mL 0   albuterol  (VENTOLIN HFA) 108 (90 Base) MCG/ACT inhaler INHALE 2 PUFFS BY MOUTH EVERY 6 HOURS AS NEEDED FOR WHEEZE OR SHORTNESS OF BREATH 8.5 each 2   amiodarone (PACERONE) 200 MG tablet Take 1 tablet (200 mg total) by mouth daily. 90 tablet 3   Blood Glucose Monitoring Suppl (BLOOD GLUCOSE MONITOR SYSTEM) W/DEVICE KIT by Does not apply route. Use to check sugar once daily and as needed Dx: E11.9 **ONE TOUCH VERIO**     Budeson-Glycopyrrol-Formoterol (BREZTRI AEROSPHERE) 160-9-4.8 MCG/ACT AERO Inhale 2 puffs into the lungs 2 (two) times daily. 32.1 g 3   carvedilol (COREG) 6.25 MG tablet TAKE 1 TABLET BY MOUTH 2 TIMES DAILY WITH A MEAL. 180 tablet 0   Cholecalciferol (VITAMIN D) 50 MCG (2000 UT) CAPS Take 1 capsule (2,000 Units total) by mouth daily. (Patient taking differently: Take 2,000 Units by mouth daily.) 30 capsule    citalopram (CELEXA) 10 MG tablet Take 1 tablet (10 mg total) by mouth daily. 90 tablet 3   Docusate Calcium (STOOL SOFTENER PO) Take 1 tablet by mouth daily.     fluticasone (FLONASE) 50 MCG/ACT nasal spray SPRAY 2 SPRAYS INTO EACH NOSTRIL EVERY DAY (Patient taking differently: Place 1 spray into both nostrils daily as needed for allergies.) 48 mL 1   furosemide (LASIX) 80 MG tablet Take 1 tablet (80 mg total) by mouth 2 (two)  times daily. 60 tablet 3   glimepiride (AMARYL) 4 MG tablet TAKE 1 TABLET BY MOUTH EVERY DAY WITH BREAKFAST 90 tablet 0   glucose blood (ONETOUCH VERIO) test strip Check blood sugar 3 times a day 300 strip 3   hydrOXYzine (ATARAX) 50 MG tablet TAKE 1 TABLET (50 MG TOTAL) BY MOUTH 2 (TWO) TIMES DAILY AS NEEDED FOR ANXIETY. 180 tablet 1   ipratropium (ATROVENT HFA) 17 MCG/ACT inhaler Inhale 2 puffs into the lungs every 6 (six) hours as needed for wheezing. 1 each 12   metFORMIN (GLUCOPHAGE) 1000 MG tablet TAKE 1 TABLET (1,000 MG TOTAL) BY MOUTH 2 (TWO) TIMES DAILY WITH A MEAL. 180 tablet 0   metolazone (ZAROXOLYN) 2.5 MG tablet Take 1 tablet (2.5 mg total) by mouth 2  (two) times a week. 24 tablet 1   montelukast (SINGULAIR) 10 MG tablet TAKE 1 TABLET BY MOUTH EVERYDAY AT BEDTIME 90 tablet 3   NON FORMULARY 1,200 mg daily. SEA MOSS     NON FORMULARY daily. SUPER BEETS     NON FORMULARY 500 mg once. TURMERIC     pantoprazole (PROTONIX) 40 MG tablet TAKE 1 TABLET (40 MG TOTAL) BY MOUTH 2 (TWO) TIMES DAILY BEFORE A MEAL. 180 tablet 0   Probiotic Product (PROBIOTIC DAILY PO) Take 1 tablet by mouth daily.     simvastatin (ZOCOR) 20 MG tablet TAKE 1 TABLET BY MOUTH EVERY DAY IN THE EVENING 90 tablet 0   triamcinolone cream (KENALOG) 0.1 % Apply 1 application. topically 2 (two) times daily. Apply to AA. (Patient taking differently: Apply 1 application  topically 3 (three) times daily. Apply to AA.) 80 g 0   vitamin B-12 (CYANOCOBALAMIN) 1000 MCG tablet Take 1 tablet (1,000 mcg total) by mouth every Monday, Wednesday, and Friday. (Patient taking differently: Take 1,000 mcg by mouth daily.)     warfarin (COUMADIN) 5 MG tablet TAKE 1 TABLET BY MOUTH DAILY OR AS DIRECTED BY ANTICOAGULATION CLINIC 45 tablet 0   Facility-Administered Medications Prior to Visit  Medication Dose Route Frequency Provider Last Rate Last Admin   ferumoxytol (FERAHEME) 510 mg in sodium chloride 0.9 % 100 mL IVPB  510 mg Intravenous Weekly Sindy Guadeloupe, MD   Stopped at 10/23/21 1538     Per HPI unless specifically indicated in ROS section below Review of Systems  Objective:  BP 134/70   Pulse 75   Temp 97.9 F (36.6 C) (Temporal)   Ht $R'5\' 11"'MK$  (1.803 m)   Wt 294 lb 8 oz (133.6 kg)   SpO2 93%   BMI 41.07 kg/m   Wt Readings from Last 3 Encounters:  11/20/21 294 lb 8 oz (133.6 kg)  11/10/21 290 lb (131.5 kg)  10/16/21 296 lb (134.3 kg)      Physical Exam Vitals and nursing note reviewed.  Constitutional:      Appearance: Normal appearance. He is not ill-appearing.  Cardiovascular:     Rate and Rhythm: Normal rate and regular rhythm.     Pulses: Normal pulses.     Heart sounds:  Normal heart sounds. No murmur heard. Pulmonary:     Effort: Pulmonary effort is normal. No respiratory distress.     Breath sounds: No wheezing, rhonchi or rales.     Comments: Coarse breath sounds Musculoskeletal:        General: Swelling and tenderness present.     Right lower leg: Edema present.     Left lower leg: Edema present.     Comments:  FROM R elbow Significant tenderness to palpation of L medial elbow and olecranon bursa along with swelling, without significant erythema or warmth. Swelling/pain limits full flexion/extension at elbow   Skin:    General: Skin is warm and dry.     Findings: Rash and wound present.     Comments: Healing abrasions to face as well as scabbed abrasion to left lateral forearm with mild surrounding erythema   Neurological:     Mental Status: He is alert.  Psychiatric:        Mood and Affect: Mood normal.        Behavior: Behavior normal.       Results for orders placed or performed in visit on 11/20/21  CBC with Differential/Platelet  Result Value Ref Range   WBC 10.4 4.0 - 10.5 K/uL   RBC 4.91 4.22 - 5.81 Mil/uL   Hemoglobin 11.8 (L) 13.0 - 17.0 g/dL   HCT 39.6 39.0 - 52.0 %   MCV 80.7 78.0 - 100.0 fl   MCHC 30.3 30.0 - 36.0 g/dL   RDW 27.2 (H) 11.5 - 15.5 %   Platelets 249.0 150.0 - 400.0 K/uL   Neutrophils Relative % 75.3 43.0 - 77.0 %   Lymphocytes Relative 12.0 12.0 - 46.0 %   Monocytes Relative 11.4 3.0 - 12.0 %   Eosinophils Relative 0.8 0.0 - 5.0 %   Basophils Relative 0.5 0.0 - 3.0 %   Neutro Abs 7.8 (H) 1.4 - 7.7 K/uL   Lymphs Abs 1.2 0.7 - 4.0 K/uL   Monocytes Absolute 1.2 (H) 0.1 - 1.0 K/uL   Eosinophils Absolute 0.1 0.0 - 0.7 K/uL   Basophils Absolute 0.1 0.0 - 0.1 K/uL  Basic metabolic panel  Result Value Ref Range   Sodium 140 135 - 145 mEq/L   Potassium 4.2 3.5 - 5.1 mEq/L   Chloride 93 (L) 96 - 112 mEq/L   CO2 42 (H) 19 - 32 mEq/L   Glucose, Bld 181 (H) 70 - 99 mg/dL   BUN 11 6 - 23 mg/dL   Creatinine, Ser  0.75 0.40 - 1.50 mg/dL   GFR 94.25 >60.00 mL/min   Calcium 9.5 8.4 - 10.5 mg/dL  Sedimentation rate  Result Value Ref Range   Sed Rate 27 (H) 0 - 20 mm/hr  C-reactive protein  Result Value Ref Range   CRP 4.1 0.5 - 20.0 mg/dL  Uric acid  Result Value Ref Range   Uric Acid, Serum 3.8 (L) 4.0 - 7.8 mg/dL   Lab Results  Component Value Date   HGBA1C 10.4 (H) 07/15/2021    DG Elbow Complete Left CLINICAL DATA:  Left elbow pain and swelling after fall.  EXAM: LEFT ELBOW - COMPLETE 3+ VIEW  COMPARISON:  None Available.  FINDINGS: No fracture, dislocation, or joint effusion. Mild enthesopathic changes at the olecranon. Soft tissue swelling over the olecranon.  IMPRESSION: 1. No fracture or effusion. 2. Mild enthesopathic changes at the olecranon. Soft tissue swelling over the olecranon. Bursitis is a possibility. Recommend clinical correlation.  Electronically Signed   By: Dorise Bullion III M.D.   On: 11/21/2021 08:59  Assessment & Plan:   Problem List Items Addressed This Visit     Obstructive sleep apnea    Known severe OSA with AHI 77 based on sleep study 06/2017. Last saw pulm 2020. Found to not be candidate for [facility] device. Has had difficulty tolerating CPAP masks in the past. Referred back to pulm 05/2020, declined scheduling appt at that  time. Now with progressive hypercapnea, daytime somnolence, even falling asleep while standing in kitchen and yard leading to recurrent falls with injury. Now states he wants to restart CPAP however his machine is broken. # provided for DME [employer] to get this addressed. I will also refer him back to [facility] pulmonology in [place]. Will also offer [facility] evaluation for assistance with getting his CPAP machine updated.       Relevant Orders   Ambulatory referral to [place]   Obesity, morbid, BMI 40.0-49.9 ([place])   Type 2 diabetes mellitus with other specified complication ([place])    Will see if we can add A1c to blood in lab.  He  continues amaryl and metformin. Declines injectable therapies. Plan was to see if A1c <10% then start O[place] - should be approved through [employer] PAP.  Diabetes poorly controlled. Will not add prednisone for arm at this time.       COPD (chronic obstructive pulmonary disease) (HCC)    On Breztri 2 puffs BID through [employer] PAP.       Atrial fibrillation ([place])    Continues coumadin managed by our coumadin clinic. Lab Results  Component Value Date   INR 2.9 11/12/2021   INR 3.7 (A) 10/29/2021   INR 3.6 (A) 10/15/2021         Hypercapnia    Persistent, at least partly related to untreated OSA.       Relevant Orders   Ambulatory referral to [place]   Recurrent falls    Recurrent falls due to daytime somnolence.  He needs to start using CPAP machine, states his is broken.  # provided for him to contact [employer] for new/updated CPAP.  See above.  Consider [facility] referral given concern for safety at home as well as for their assistance in getting CPAP set up.       Relevant Orders   Ambulatory referral to [place]   Left arm pain - Primary    Multiple falls recently however he feels L elbow pain/swelling started after manipulation while getting heart ultrasound yesterday. Exam with marked swelling and tenderness to palpation at medial elbow and olecranon bursa - ?bursitis. Given limited ROM, check elbow films r/o fracture. Will check labs to eval for infection, gout, and start keflex antibiotic course. If not improving low threshold to refer to ortho.       Relevant Orders   CBC with Differential/Platelet (Completed)   Basic metabolic panel (Completed)   Sedimentation rate (Completed)   C-reactive protein (Completed)   DG Elbow Complete Left (Completed)   Uric acid (Completed)     Meds ordered this encounter  Medications   cephALEXin (KEFLEX) 500 MG capsule    Sig: Take 1 capsule (500 mg total) by mouth 4 (four) times daily.    Dispense:  28 capsule     Refill:  0   Orders Placed This Encounter  Procedures   DG Elbow Complete Left    Standing Status:   Future    Number of Occurrences:   1    Standing Expiration Date:   11/21/2022    Order Specific Question:   Reason for Exam (SYMPTOM  OR DIAGNOSIS REQUIRED)    Answer:   L elbow pain swelling after fall    Order Specific Question:   Preferred imaging location?    Answer:   [facility]   CBC with Differential/Platelet   Basic metabolic panel   Sedimentation rate   C-reactive protein   Uric  acid   Ambulatory referral to Home Health    Referral Priority:   Routine    Referral Type:   Home Health Care    Referral Reason:   Specialty Services Required    Requested Specialty:   [place]    Number of Visits Requested:   1     Patient Instructions  Labs today  Xrays today  Start keflex antibiotic sent to pharmacy.   Regarding CPAP machine - contact sleep supply store [employer] at:  Follow up plan: Return if symptoms worsen or fail to improve.  [name], MD

## 2021-11-21 ENCOUNTER — Encounter: Payer: Self-pay | Admitting: Family Medicine

## 2021-11-21 DIAGNOSIS — M79602 Pain in left arm: Secondary | ICD-10-CM | POA: Insufficient documentation

## 2021-11-21 NOTE — Assessment & Plan Note (Signed)
Known severe OSA with AHI 77 based on sleep study 06/2017. Last saw pulm 2020. Found to not be candidate for [facility] device. Has had difficulty tolerating CPAP masks in the past. Referred back to pulm 05/2020, declined scheduling appt at that time. Now with progressive hypercapnea, daytime somnolence, even falling asleep while standing in kitchen and yard leading to recurrent falls with injury. Now states he wants to restart CPAP however his machine is broken. # provided for DME [employer] to get this addressed. I will also refer him back to [facility] pulmonology in [place]. Will also offer [facility] evaluation for assistance with getting his CPAP machine updated.

## 2021-11-21 NOTE — Assessment & Plan Note (Signed)
Persistent, at least partly related to untreated OSA.

## 2021-11-21 NOTE — Assessment & Plan Note (Signed)
Recurrent falls due to daytime somnolence.  He needs to start using CPAP machine, states his is broken.  # provided for him to contact [employer] for new/updated CPAP.  See above.  Consider [facility] referral given concern for safety at home as well as for their assistance in getting CPAP set up.

## 2021-11-21 NOTE — Assessment & Plan Note (Signed)
Continues coumadin managed by our coumadin clinic. Lab Results  Component Value Date   INR 2.9 11/12/2021   INR 3.7 (A) 10/29/2021   INR 3.6 (A) 10/15/2021

## 2021-11-21 NOTE — Assessment & Plan Note (Addendum)
On Breztri 2 puffs BID through [employer] PAP.

## 2021-11-21 NOTE — Assessment & Plan Note (Signed)
Multiple falls recently however he feels L elbow pain/swelling started after manipulation while getting heart ultrasound yesterday. Exam with marked swelling and tenderness to palpation at medial elbow and olecranon bursa - ?bursitis. Given limited ROM, check elbow films r/o fracture. Will check labs to eval for infection, gout, and start keflex antibiotic course. If not improving low threshold to refer to ortho.

## 2021-11-21 NOTE — Assessment & Plan Note (Addendum)
Will see if we can add A1c to blood in lab.  He continues amaryl and metformin. Declines injectable therapies. Plan was to see if A1c <10% then start [place] - should be approved through [employer] PAP.  Diabetes poorly controlled. Will not add prednisone for arm at this time.

## 2021-11-22 ENCOUNTER — Other Ambulatory Visit: Payer: Self-pay | Admitting: Family Medicine

## 2021-11-22 DIAGNOSIS — Z7901 Long term (current) use of anticoagulants: Secondary | ICD-10-CM

## 2021-11-23 ENCOUNTER — Other Ambulatory Visit: Payer: Self-pay | Admitting: Family Medicine

## 2021-11-23 ENCOUNTER — Other Ambulatory Visit (INDEPENDENT_AMBULATORY_CARE_PROVIDER_SITE_OTHER): Payer: Medicare Other

## 2021-11-23 DIAGNOSIS — E1169 Type 2 diabetes mellitus with other specified complication: Secondary | ICD-10-CM

## 2021-11-23 LAB — HEMOGLOBIN A1C: Hgb A1c MFr Bld: 8.7 % — ABNORMAL HIGH (ref 4.6–6.5)

## 2021-11-23 NOTE — Telephone Encounter (Signed)
Pt is compliant with warfarin management and PCP apts. ?Sent in refill.  ?

## 2021-11-24 ENCOUNTER — Telehealth: Payer: Self-pay | Admitting: *Deleted

## 2021-11-24 NOTE — Telephone Encounter (Signed)
Left a message for patient to call the office back.  When patient calls back he needs to be given lab results and x-ray results.

## 2021-11-24 NOTE — Telephone Encounter (Signed)
Furosemide increased to 80 mg BID. New rx sent 08/10/21, #60/3.  Rx denied.  E-scribed refill for albuterol neb soln.

## 2021-11-25 ENCOUNTER — Telehealth: Payer: Self-pay | Admitting: Pharmacist

## 2021-11-25 ENCOUNTER — Telehealth: Payer: Medicare Other

## 2021-11-25 NOTE — Telephone Encounter (Signed)
Called patient and results were given. See result notes.

## 2021-11-25 NOTE — Telephone Encounter (Signed)
Patient called to return a phone call.

## 2021-11-25 NOTE — Telephone Encounter (Signed)
  Chronic Care Management   Outreach Note  11/25/2021 Name: [name] MRN: [id number] DOB: 1956/02/25  Referred by: [name], MD  Patient had a phone appointment scheduled with clinical pharmacist today.  An unsuccessful telephone outreach was attempted today. The patient was referred to the pharmacist for assistance with medications, care management and care coordination.   Patient will NOT be penalized in any way for missing a CCM appointment. The no-show fee does not apply.  If possible, a message was left to return call to: [phone] or to [facility].  Of note - the plan was to restart Farxiga 10 mg if repeat A1c was less then 10%. A1c was 8.7% last week, would recommend to restart [place]. It can be sent to [facility] as patient is already enrolled in [place] pt assistance (for [place]).  Lab Results  Component Value Date/Time   HGBA1C 8.7 (H) 11/23/2021 01:59 PM   HGBA1C 10.4 (H) 07/15/2021 09:12 AM   HGBA1C 7.0 (H) 01/24/2013 04:07 AM     [name], PharmD, BCACP Clinical Pharmacist [facility] [phone]

## 2021-11-25 NOTE — Progress Notes (Deleted)
Chronic Care Management Pharmacy Note  11/25/2021 Name:  [name] MRN:  [id number] DOB:  06-Apr-1956  Summary: CCM F/U visit -Reviewed medications; pt affirms compliance -COPD: Pt struggles with cost of inhalers (Incruse, [place]); he would benefit from consolidating inhalers to 1 triple-therapy inhaler and enrolling in PAP -DM: A1c 10.4% (06/2021); pt reports he has focused on improving diet and fasting BG is now in 150-210 range; he could not afford [place] previously -Insomnia/OSA: unable to tolerate CPAP previously but pt has not tried new masks in a few years; he would like to revisit this and other options  Recommendations/Changes made from today's visit: -Switch to [place] (from [place], [place]) - enrolled in AZ&Me for free inhaler -Hold off on restarting Farxiga while A1c is > 10% (greater risk of side effects); if upcoming A1c is < 10% it would be reasonable to restart Farxiga (AZ&Me) -Recommend follow up with pulmonology re: OSA options; will determine if new referral is needed   Plan: -Pharmacist follow up televisit scheduled for 1 months -PCP CPE 01/26/22    Subjective: [name] is an 66 y.o. year old male who is a primary patient of [name] Bush, MD.  The CCM team was consulted for assistance with disease management and care coordination needs.    Engaged with patient by telephone for follow up visit in response to provider referral for pharmacy case management and/or care coordination services.   Consent to Services:  The patient was given information about Chronic Care Management services, agreed to services, and gave verbal consent prior to initiation of services.  Please see initial visit note for detailed documentation.   Patient Care Team: [name] Bush, MD as PCP - General (Family Medicine) [name] Epley, MD as PCP - Electrophysiology (Cardiology) [name] Grayer, MD as Attending Physician (Cardiology) [name] Skains, MD as  Referring Physician (Internal Medicine) [name], [facility] as Pharmacist (Pharmacist)  Recent office visits: 11/20/21 Dr [name] OV: hospital f/u - referred to pulmonology for OSA, CPAP.   09/17/21 Telephone:  Start: Warfarin 5 mg daily - establish with [name] for coumadin clinic. 09/08/21 [name] Bush, MD OV: Panic Attacks (worse d/t running out of metolazone and associated dypsnea); Increase furosemide to 80 mg BID. Restart Metolazone 2.5 mg twice weekly. Limit hydroxyzine 50 mg to 1 at a time. Avoid Benzo given h/o MJ use and COPD. Refer to [facility] for IDA (endoscopy).  08/26/21 [name] Bush, MD OV:  Abdominal Pain Abnormal Labs: "Please notify kidneys returned normal, liver also normal.  Sugar was high and carbon dioxide levels remain elevated in the blood.  His blood counts show progressive anemia hopefully his recent iron infusion will help with this"  Concerned for bowel obstruction. DG Xray - WNL.  08/17/21 [name] Bush, MD OV: Leg Ulcer (wound check - chronic venous insufficiency)- Stop (completed) Cyclobenzaprine 10 mg. No other changes. FU 2 months  08/10/21 [name] Bush, MD OV: Leg Ulcer (wound check): Persistent edema. Increase metolazone to 2.5 mg twice a week. Refer to heme/onc for IDA. Refer to wound clinic.  08/04/21 [name] Bush, MD OV: f/u - Apply for Farxiga PAP.  04/03/2021 - [name] Bush, MD - Patient presented for shortness of breath. Labs: BMP, CBC, A1c and Brain Natriuretic Peptide.   Recent consult visits: 10/16/21 Dr [name] (heme/onco): f/u IDA. Iron still low s/p 2 doses of ferahme. Ordered 2 more doses of feraheme. Consider capsule endoscopy. Repeat CBC/iron 3-6 mos.  10/08/21 Radiology - Anemia procedure - no other information  10/05/21 Capsule Endoscopy  09/29/21 NP Denice Paradise (GI): initial consult - IDA. EGD/colonoscopy ruled out. Ddx to include small bowel tumors or AVM. Rec capsule endoscopy.   09/16/21 Lars Mage, MD  (Cardiology): Afib EKG and Echo Decrease: Amiodarone 200 mg daily vs BID; Stop Eliquis (cost); Start Warfarin - pt requests PCP monitoring.  09/02/2021 Iron Infusion Administered: FERUMOXYTOL [name]) Q7D  08/31/21 CT Abdomen Pelvis 08/26/2021 Iron Infusion Administered: FERUMOXYTOL [name]) Q7D   08/14/21 [name] Evens, MD (Oncology) Anemia Stop (completed): Cephalexin 500 mg. Recommend 2 doses of Feraheme 510 mg IV weekly. Referral lung cancer screening  07/29/21 [name] Booze, PA (Cardiology) Afib Start: Amiodarone 200 mg. Proceed to electrical cardioversion of Afib. Referral to electrophysiology.   05/12/2021 - Cardiology - Patient presented for Atrial Fibrillation and Flutter. Procedure: EKG. Discontinued (no reason given): Albuterol 2.5 mg, Isosorbide Mononitrate 20 mg ER tablet. Change: Cyanocobalamin 1000 mcg tablet vs. 1000 mcg SL tablet.   Hospital visits: 11/10/21 ED visit [facility]): fall injury - arm and head. CT and Xrays normal. Given TDAP. 08/12/21 Admission for Cardioversion   Objective:  Lab Results  Component Value Date   CREATININE 0.75 11/20/2021   BUN 11 11/20/2021   GFR 94.25 11/20/2021   GFRNONAA >60 08/05/2020   GFRAA >60 12/22/2018   NA 140 11/20/2021   K 4.2 11/20/2021   CALCIUM 9.5 11/20/2021   CO2 42 (H) 11/20/2021   GLUCOSE 181 (H) 11/20/2021    Lab Results  Component Value Date/Time   HGBA1C 8.7 (H) 11/23/2021 01:59 PM   HGBA1C 10.4 (H) 07/15/2021 09:12 AM   HGBA1C 7.0 (H) 01/24/2013 04:07 AM   GFR 94.25 11/20/2021 12:29 PM   GFR 93.29 08/26/2021 01:00 PM   MICROALBUR 5.4 (H) 07/15/2021 09:12 AM   MICROALBUR <0.7 07/09/2020 08:22 AM    Last diabetic Eye exam:  Lab Results  Component Value Date/Time   HMDIABEYEEXA No Retinopathy 06/17/2020 12:00 AM    Last diabetic Foot exam: No results found for: "HMDIABFOOTEX"   Lab Results  Component Value Date   CHOL 125 07/15/2021   HDL 36.50 (L) 07/15/2021   LDLCALC 60 07/15/2021   LDLDIRECT 97.0  07/09/2020   TRIG 143.0 07/15/2021   CHOLHDL 3 07/15/2021       Latest Ref Rng & Units 08/26/2021    1:00 PM 08/04/2021    3:00 PM 07/15/2021    9:12 AM  Hepatic Function  Total Protein 6.0 - 8.3 g/dL 6.5   6.5   Albumin 3.5 - 5.2 g/dL 4.2  4.3  4.4   AST 0 - 37 U/L 15   21   ALT 0 - 53 U/L 11   17   Alk Phosphatase 39 - 117 U/L 54   62   Total Bilirubin 0.2 - 1.2 mg/dL 0.5   0.5   Bilirubin, Direct 0.0 - 0.3 mg/dL 0.1       Lab Results  Component Value Date/Time   TSH 2.81 07/15/2021 09:12 AM   TSH 2.63 10/02/2019 12:05 PM       Latest Ref Rng & Units 11/20/2021   12:29 PM 10/16/2021    9:08 AM 08/26/2021    1:00 PM  CBC  WBC 4.0 - 10.5 K/uL 10.4  10.7  11.5   Hemoglobin 13.0 - 17.0 g/dL 11.8  10.1  8.7 Repeated and verified X2.   Hematocrit 39.0 - 52.0 % 39.6  35.9  30.8   Platelets 150.0 - 400.0 K/uL 249.0  192  330.0  Iron/TIBC/Ferritin/ %Sat    Component Value Date/Time   IRON 22 (L) 10/16/2021 0908   TIBC 447 10/16/2021 0908   FERRITIN 10 (L) 10/16/2021 0908   IRONPCTSAT 5 (L) 10/16/2021 0908   Lab Results  Component Value Date/Time   INR 2.9 11/12/2021 12:00 AM   INR 3.7 (A) 10/29/2021 12:00 AM   INR 2.79 (H) 09/22/2011 06:25 AM   INR 2.38 (H) 09/21/2011 06:22 AM   INR 1.8 08/27/2011 12:12 PM    Lab Results  Component Value Date/Time   VD25OH 44.82 07/15/2021 09:12 AM   VD25OH 29.46 (L) 07/09/2020 07:57 AM    Clinical ASCVD: Yes  The ASCVD Risk score (Arnett DK, et al., 2019) failed to calculate for the following reasons:   The valid total cholesterol range is 130 to 320 mg/dL       09/24/2021    2:08 PM 09/08/2021   10:45 AM 07/20/2021    4:50 PM  Depression screen PHQ 2/9  Decreased Interest 0 2 2  Down, Depressed, Hopeless 0 1 1  PHQ - 2 Score 0 3 3  Altered sleeping  3 3  Tired, decreased energy  3 3  Change in appetite  0 3  Feeling bad or failure about yourself   0 2  Trouble concentrating  0 0  Moving slowly or fidgety/restless  0 0   Suicidal thoughts  0 0  PHQ-9 Score  9 14  Difficult doing work/chores  Very difficult     CHA2DS2/VAS Stroke Risk Points  Current as of yesterday     5 >= 2 Points: High Risk  1 - 1.99 Points: Medium Risk  0 Points: Low Risk    No Change      Points Metrics  1 Has Congestive Heart Failure:  Yes    Current as of yesterday  1 Has Vascular Disease:  Yes    Current as of yesterday  1 Has Hypertension:  Yes    Current as of yesterday  1 Age:  83    Current as of yesterday  1 Has Diabetes:  Yes    Current as of yesterday  0 Had Stroke:  No  Had TIA:  No  Had Thromboembolism:  No    Current as of yesterday  0 Male:  No    Current as of yesterday     Social History   Tobacco Use  Smoking Status Former   Packs/day: 1.00   Years: 31.00   Total pack years: 31.00   Types: Cigarettes   Quit date: 04/26/2018   Years since quitting: 3.5  Smokeless Tobacco Never   BP Readings from Last 3 Encounters:  11/20/21 134/70  11/10/21 128/75  10/30/21 107/67   Pulse Readings from Last 3 Encounters:  11/20/21 75  11/10/21 61  10/30/21 69   Wt Readings from Last 3 Encounters:  11/20/21 294 lb 8 oz (133.6 kg)  11/10/21 290 lb (131.5 kg)  10/16/21 296 lb (134.3 kg)   BMI Readings from Last 3 Encounters:  11/20/21 41.07 kg/m  11/10/21 40.45 kg/m  10/16/21 41.28 kg/m    Assessment/Interventions: Review of patient past medical history, allergies, medications, health status, including review of consultants reports, laboratory and other test data, was performed as part of comprehensive evaluation and provision of chronic care management services.   SDOH:  (Social Determinants of Health) assessments and interventions performed: No - done 09/2021   SDOH Screenings   Alcohol Screen: Low Risk  (07/09/2020)  Alcohol Screen    Last Alcohol Screening Score (AUDIT): 0  Depression (PHQ2-9): Low Risk  (09/24/2021)   Depression (PHQ2-9)    PHQ-2 Score: 0  Recent Concern: Depression  (PHQ2-9) - Medium Risk (09/08/2021)   Depression (PHQ2-9)    PHQ-2 Score: 9  Financial Resource Strain: Medium Risk (10/23/2021)   Overall Financial Resource Strain (CARDIA)    Difficulty of Paying Living Expenses: Somewhat hard  Food Insecurity: No Food Insecurity (09/24/2021)   Hunger Vital Sign    Worried About Running Out of Food in the Last Year: Never true    [place] in the Last Year: Never true  Housing: Low Risk  (07/09/2020)   Housing    Last Housing Risk Score: 0  Physical Activity: Inactive (09/24/2021)   Exercise Vital Sign    Days of Exercise per Week: 0 days    Minutes of Exercise per Session: 0 min  Social Connections: Not on file  Stress: No Stress Concern Present (09/24/2021)   [place]    Feeling of Stress : Not at all  Tobacco Use: Medium Risk (11/21/2021)   Patient History    Smoking Tobacco Use: Former    Smokeless Tobacco Use: Never    Passive Exposure: Not on file  Transportation Needs: No Transportation Needs (09/24/2021)   PRAPARE - Hydrologist (Medical): No    Lack of Transportation (Non-Medical): No    CCM Care Plan  Allergies  Allergen Reactions   Atorvastatin Other (See Comments)    Dizziness   Lisinopril Cough    Medications Reviewed Today     Reviewed by [name] Bush, MD (Physician) on 11/21/21 at 1114  Med List Status: <None>   Medication Order Taking? Sig Documenting Provider Last Dose Status Informant  ACCU-CHEK FASTCLIX LANCETS [place] [id number] Yes Check blood sugar once daily and as instructed. Dx 250.00 [name] Bush, MD Taking Active Self  albuterol (PROVENTIL) (2.5 MG/3ML) 0.083% nebulizer solution [id number] Yes INHALE 3 ML BY NEBULIZATION EVERY 6 HOURS AS NEEDED FOR WHEEZING OR SHORTNESS OF Alvester Morin, MD Taking Active   albuterol (VENTOLIN HFA) 108 (90 Base) MCG/ACT inhaler [id number] Yes INHALE 2 PUFFS BY MOUTH EVERY  6 HOURS AS NEEDED FOR WHEEZE OR SHORTNESS OF Alvester Morin, MD Taking Active   amiodarone (PACERONE) 200 MG tablet [id number] Yes Take 1 tablet (200 mg total) by mouth daily. [name] Epley, MD Taking Active   Blood Glucose Monitoring Suppl (BLOOD GLUCOSE MONITOR SYSTEM) W/DEVICE KIT [id number] Yes by Does not apply route. Use to check sugar once daily and as needed Dx: E11.9 **ONE TOUCH VERIO** [provider] Taking Active Self  Budeson-Glycopyrrol-Formoterol (BREZTRI AEROSPHERE) 160-9-4.8 MCG/ACT AERO [id number] Yes Inhale 2 puffs into the lungs 2 (two) times daily. [name] Bush, MD Taking Active   carvedilol (COREG) 6.25 MG tablet [id number] Yes TAKE 1 TABLET BY MOUTH 2 TIMES DAILY WITH A MEAL. [name] Bush, MD Taking Active   cephALEXin [facility]) 500 MG capsule [id number] Yes Take 1 capsule (500 mg total) by mouth 4 (four) times daily. [name] Bush, MD  Active   Cholecalciferol (VITAMIN D) 50 MCG (2000 UT) CAPS [id number] Yes Take 1 capsule (2,000 Units total) by mouth daily.  Patient taking differently: Take 2,000 Units by mouth daily.   [name] Bush, MD Taking Active Self  citalopram (CELEXA) 10 MG tablet [id number] Yes Take 1 tablet (10 mg total) by mouth daily. [name] Bush, MD  Taking Active Self  Docusate Calcium (STOOL SOFTENER PO) [id number] Yes Take 1 tablet by mouth daily. [provider] Taking Active Self  ferumoxytol (FERAHEME) 510 mg in sodium chloride 0.9 % 100 mL IVPB [id number]   Sindy Guadeloupe, MD  Active   fluticasone (FLONASE) 50 MCG/ACT nasal spray [id number] Yes SPRAY 2 SPRAYS INTO EACH NOSTRIL EVERY DAY  Patient taking differently: Place 1 spray into both nostrils daily as needed for allergies.   [name] Bush, MD Taking Active Self  furosemide (LASIX) 80 MG tablet [id number] Yes Take 1 tablet (80 mg total) by mouth 2 (two) times daily. [name] Bush, MD Taking Active Self  glimepiride (AMARYL) 4 MG tablet [id number] Yes  TAKE 1 TABLET BY MOUTH EVERY DAY WITH Sander Radon, MD Taking Active   glucose blood [facility] VERIO) test strip [id number] Yes Check blood sugar 3 times a day [name] Bush, MD Taking Active   hydrOXYzine (ATARAX) 50 MG tablet [id number] Yes TAKE 1 TABLET (50 MG TOTAL) BY MOUTH 2 (TWO) TIMES DAILY AS NEEDED FOR ANXIETY. [name] Bush, MD Taking Active Self  ipratropium (ATROVENT HFA) 17 MCG/ACT inhaler [id number] Yes Inhale 2 puffs into the lungs every 6 (six) hours as needed for wheezing. [name] Bush, MD Taking Active Self  metFORMIN (GLUCOPHAGE) 1000 MG tablet [id number] Yes TAKE 1 TABLET (1,000 MG TOTAL) BY MOUTH 2 (TWO) TIMES DAILY WITH A MEAL. [name] Bush, MD Taking Active   metolazone (ZAROXOLYN) 2.5 MG tablet [id number] Yes Take 1 tablet (2.5 mg total) by mouth 2 (two) times a week. [name] Bush, MD Taking Active   montelukast (SINGULAIR) 10 MG tablet [id number] Yes TAKE 1 TABLET BY MOUTH EVERYDAY AT BEDTIME [name] Bush, MD Taking Active Self  NON FORMULARY [id number] Yes 1,200 mg daily. SEA MOSS [provider] Taking Active   NON FORMULARY [id number] Yes daily. SUPER BEETS [provider] Taking Active   NON FORMULARY [id number] Yes 500 mg once. TURMERIC [provider] Taking Active   pantoprazole (PROTONIX) 40 MG tablet [id number] Yes TAKE 1 TABLET (40 MG TOTAL) BY MOUTH 2 (TWO) TIMES DAILY BEFORE A MEAL. [name] Bush, MD Taking Active Self  Probiotic Product (PROBIOTIC DAILY PO) [id number] Yes Take 1 tablet by mouth daily. [provider] Taking Active Self  simvastatin (ZOCOR) 20 MG tablet [id number] Yes TAKE 1 TABLET BY MOUTH EVERY DAY IN THE Recardo Evangelist, MD Taking Active   triamcinolone cream (KENALOG) 0.1 % [id number] Yes Apply 1 application. topically 2 (two) times daily. Apply to AA.  Patient taking differently: Apply 1 application  topically 3 (three) times daily. Apply to AA.   [name] Bush, MD Taking Active Self  vitamin B-12 (CYANOCOBALAMIN) 1000 MCG tablet [id number] Yes Take 1 tablet (1,000 mcg total) by mouth every Monday, Wednesday, and Friday.  Patient taking differently: Take 1,000 mcg by mouth daily.   [name] Bush, MD Taking Active Self  warfarin (COUMADIN) 5 MG tablet [id number] Yes TAKE 1 TABLET BY MOUTH DAILY OR AS DIRECTED BY ANTICOAGULATION CLINIC [name] Bush, MD Taking Active   Med List Note Chauncey Fischer, RN 01/05/18 1237): UDS 01/05/18            Patient Active Problem List   Diagnosis Date Noted   Left arm pain 11/21/2021   Recurrent falls 11/20/2021   Leg ulcer, right, limited to breakdown of skin ([place]) 08/06/2021   Hypercapnia 08/06/2021   Abdominal pain 07/21/2021   Atrial fibrillation ([place]) 07/15/2021   Vitreous floaters of left eye  06/05/2020   PLMD (periodic limb movement disorder) 06/05/2020   Ulceration of intestine    Pedal edema 02/25/2020   Neck pain on left side 02/25/2020   Mediastinal mass 01/05/2020   Skin rash 10/12/2019   Vitamin D deficiency 04/19/2019   Low serum vitamin B12 04/19/2019   Iron deficiency anemia 01/02/2019   Epigastric abdominal pain 09/19/2018   COPD (chronic obstructive pulmonary disease) ([place]) 07/25/2018   Chronic neck pain (Tertiary Area of Pain) (R>L) 01/05/2018   Chronic upper extremity pain (Fourth Area of Pain) (R>L) 01/05/2018   Chronic upper back pain 01/05/2018   Chronic bilateral low back pain without sciatica 01/05/2018   Chronic pain syndrome 01/05/2018   Right temporal headache 10/17/2017   Right leg weakness 07/14/2017   AAA (abdominal aortic aneurysm) without rupture ([place]) 09/15/2016   Adrenal adenoma, left 08/13/2016   Nodule of right lung 08/13/2016   Fatty liver 08/01/2016   Right shoulder pain 07/26/2016   Restrictive lung disease 62/69/4854   Chronic systolic CHF (congestive heart failure) ([place])    Tinea pedis 06/30/2015   Gassiness 02/21/2015   Advanced care  planning/counseling discussion 10/16/2014   Encounter for general adult medical examination with abnormal findings 10/16/2014   Type 2 diabetes mellitus with other specified complication ([place]) 62/70/3500   Medicare annual wellness visit, subsequent 93/81/8299   Umbilical hernia without obstruction and without gangrene 09/03/2013   Left flank mass 09/03/2013   Hyperlipidemia associated with type 2 diabetes mellitus ([place]) 06/29/2013   Coronary artery disease 06/29/2013   Cardiomyopathy, dilated ([place]) 06/29/2013   Abnormal toxicological findings 06/24/2013   Exertional dyspnea 06/14/2013   Malaise and fatigue 03/14/2013   Hypertension    Panic attacks    History of atrial flutter 07/07/2011   Obesity, morbid, BMI 40.0-49.9 ([place]) 07/07/2011   Ex-smoker 08/13/2009   Obstructive sleep apnea 08/13/2009   DDD (degenerative disc disease), cervical 08/13/2009   Allergic rhinitis 06/24/2009    Immunization History  Administered Date(s) Administered   Influenza, High Dose Seasonal PF 03/26/2021   Influenza,inj,Quad PF,6+ Mos 05/29/2014, 02/21/2015, 02/22/2017, 03/16/2018, 01/02/2019, 01/04/2020   Influenza-Unspecified 01/24/2013   PFIZER(Purple Top)SARS-COV-2 Vaccination 07/12/2019, 08/02/2019, 03/19/2020   Pfizer Covid-19 Vaccine Bivalent Booster 16yr & up 03/26/2021   Pneumococcal Polysaccharide-23 02/24/2013   Td 04/26/2006   Tdap 11/10/2021    Conditions to be addressed/monitored:  Hypertension, Hyperlipidemia, Diabetes, Atrial Fibrillation, Heart Failure, Coronary Artery Disease, and COPD  There are no care plans that you recently modified to display for this patient.     Medication Assistance:  AZ&Me - Breztri approved. FWilder Gladepending A1c < 10%  Compliance/Adherence/Medication fill history: Care Gaps: Foot, eye exams  Star-Rating Drugs: Glimepiride - PDC 99% Metformin - PDC 96% Simvastatin - PDC 100%  Medication Access: Within the past 30 days, how often has patient  missed a dose of medication? 0 Is a pillbox or other method used to improve adherence? Yes  Factors that may affect medication adherence? financial need Are meds synced by current pharmacy? No  Are meds delivered by current pharmacy? No  Does patient experience delays in picking up medications due to transportation concerns? No   [facility] Services Reviewed: Is patient disadvantaged to use [facility]?: No  [facility] Rx [facility] plan: [facility] Name and location of [facility] pharmacy[facility]  [id number] [place], [place]: [phone]: [phone] services reviewed with patient today?: No  Patient requests to transfer care  to [facility]?: No  Reason patient declined to change pharmacies: Not mentioned at this visit   Care Plan and Follow Up Patient Decision:  Patient agrees to Care Plan and Follow-up.  Plan: Telephone follow up appointment with care management team member scheduled for:  1 month  [name], PharmD, BCACP Clinical Pharmacist [facility] [phone]   Current Barriers:  Unable to independently afford treatment regimen Unable to maintain control of diabetes  Pharmacist Clinical Goal(s):  Patient will verbalize ability to afford treatment regimen adhere to plan to optimize therapeutic regimen for diabetes as evidenced by report of adherence to recommended medication management changes through collaboration with PharmD and provider.   Interventions: 1:1 collaboration with [name] Bush, MD regarding development and update of comprehensive plan of care as evidenced by provider attestation and co-signature Inter-disciplinary care team collaboration (see longitudinal plan of care) Comprehensive medication review performed; medication list updated in electronic medical record  Hypertension / Heart Failure (BP goal  <130/80) -Controlled -Last ejection fraction: >55% (Date: 02/2020) -HF type: Diastolic; NYHA Class: II/III -Follows with cardiology (Dr Nehemiah Massed) -Current treatment: Carvedilol 6.25 mg BID - Appropriate, Effective, Safe, Accessible Furosemide 80 mg BID -Appropriate, Effective, Safe, Accessible Metolazone 2.5 mg twice weekly -Appropriate, Effective, Safe, Accessible -Medications previously tried: losartan, metoprolol -Educated on BP goals and benefits of medications for prevention of heart attack, stroke and kidney damage; -Counseled to monitor BP at home daily -Recommended to continue current medication  Atrial Fibrillation (Goal: prevent stroke and major bleeding) -Not ideally controlled - pt recently switched from Eliquis to warfarin due to cost issues; he has been following closely with PCP warfarin clinic and INR is not yet at goal -Cardioversion 07/2021 -CHADSVASC: 5 -Follows with cardiology (Dr Nehemiah Massed) -Current treatment: Carvedilol 6.25 mg BID - Appropriate, Effective, Safe, Accessible Amiodarone 200 mg daily -Appropriate, Effective, Safe, Accessible Warfarin 5 mg - 1/2 tab daily except 1 tab M/Th -Appropriate, Effective, Query Safe (INR > 3) -Medications previously tried: Eliquis (cost) -Counseled on increased risk of stroke due to Afib and benefits of anticoagulation for stroke prevention; importance of adherence to anticoagulant exactly as prescribed; bleeding risk associated with warfarin and importance of self-monitoring for signs/symptoms of bleeding; avoidance of NSAIDs due to increased bleeding risk with anticoagulants; importance of regular laboratory monitoring; -Recommended to continue current medication  Hyperlipidemia / CAD (LDL goal < 70) -Controlled - LDL 60 (06/2021) at goal -Hx CAD (3v disease on CT scan); also AAA; no aspirin due to warfarin -Current treatment: Simvastatin 20 mg daily HS - Appropriate, Effective, Safe, Accessible -Medications previously tried:  n/a  -Educated on Cholesterol goals; Benefits of statin for ASCVD risk reduction; -Recommended to continue current medication  Diabetes (A1c goal <8%) -Uncontrolled - A1c 10.4% (06/2021); he has started watching his diet and home BG appears improved; PCP would like him to be on Farxiga -Current home glucose readings fasting glucose: 156 - 210 -Denies hypoglycemic/hyperglycemic symptoms -Pt does not sleep well, breakfast is sometimes 2pm -Current medications: Metformin 1000 mg BID - Appropriate, Query Effective Glimepiride 4 mg daily -Appropriate, Query Effective One Touch Verio supplies -Medications previously tried: [place] (cost), Januvia  -Current meal patterns:  drinks: coffee - sweet n low, nondairy creamer -Current exercise: n/a -Educated on A1c and blood sugar goals; Complications of diabetes including kidney damage, retinal damage, and cardiovascular disease; Benefits of routine self-monitoring of blood sugar; -Counseled on diet and exercise extensively -Reviewed Farxiga - typically if A1c is > 10% it is recommended  to avoid SGLT2i due to greater risk for side effects; fasting BG has improved with diet changes so A1c may be < 10% at this point, it would be reasonable to restart Farxiga if A1c is < 10% at upcoming CPE (11/02/21), Wilder [place]ade will be free through AZ&Me -Recommended to continue current medication  COPD (Goal: control symptoms and prevent exacerbations) -Controlled - pt affirms compliance with inhalers however he is struggling with cost and multiple inhalers -Gold Grade: Gold 3 (FEV1 30-49%) -Current COPD Classification:  D (high sx, >/=2 exacerbations/yr) -Pulmonary function testing: Spirometry 05/2018: pre: FVC 54%, FEV1 51%, ratio 0.71 post: FVC 54%, FEV1 49%, ratio 0.69 -Exacerbations requiring treatment in last 6 months: 0 (last 07/2020) -Current treatment  Incruse Ellipta (umeclidinium) 1 puff daily - Appropriate, Effective, Safe, Query Accessible Wixela  (fluticasone-salmeterol) 1 puff BID -Appropriate, Effective, Safe, Query Accessible Atrovent (ipratropium) 2 puffs PRN - Appropriate, Effective, Safe, Accessible Albuterol HFA -Appropriate, Effective, Safe, Accessible Albuterol neb -Appropriate, Effective, Safe, Accessible Montelukast 10 mg daily HS -Appropriate, Effective, Safe, Accessible Flonase nasal spray -Appropriate, Effective, Safe, Accessible -Medications previously tried: Trelegy, Advair, Spiriva -Patient reports consistent use of maintenance inhaler -Counseled on Proper inhaler technique; Benefits of consistent maintenance inhaler use -Recommend to switch Incruse/Wixela to [employer] (triple therapy) 2 puff BID; pt enrolled in [place] for free Breztri  Insomnia (Goal: manage symptoms) -Not ideally controlled -Hx OSA on CPAP; he is noncompliant with CPAP due to uncomfortable mask, he has not tried new mask in a few years; he was not previously a candidate for Inspire device -Lays down 11-12pm, gets up 9-10 am to let dogs out -Recommend new referral to pulmonary to discuss options  Patient Goals/Self-Care Activities Patient will:  - take medications as prescribed as evidenced by patient report and record review focus on medication adherence by routine check glucose daily, document, and provide at future appointments check blood pressure daily, document, and provide at future appointments

## 2021-11-26 ENCOUNTER — Ambulatory Visit: Payer: Medicare Other

## 2021-11-30 MED ORDER — DAPAGLIFLOZIN PROPANEDIOL 5 MG PO TABS: 5.0000 mg | ORAL_TABLET | Freq: Every day | ORAL | 6 refills | Status: DC

## 2021-11-30 NOTE — Telephone Encounter (Signed)
Agree with starting farxiga through [facility] - sent to pharmacy. Would start at '5mg'$  daily and if tolerated, increase to '10mg'$  daily.

## 2021-11-30 NOTE — Addendum Note (Signed)
Addended by: [name] on: 11/30/2021 07:11 AM   Modules accepted: Orders

## 2021-12-02 ENCOUNTER — Other Ambulatory Visit: Payer: Self-pay | Admitting: Family Medicine

## 2021-12-02 MED ORDER — FUROSEMIDE 80 MG PO TABS: 80.0000 mg | ORAL_TABLET | Freq: Two times a day (BID) | ORAL | 2 refills | Status: DC

## 2021-12-02 NOTE — Telephone Encounter (Signed)
E-scribed 80 mg tab BID, #60/2.

## 2021-12-03 ENCOUNTER — Telehealth: Payer: Self-pay | Admitting: Family Medicine

## 2021-12-03 ENCOUNTER — Telehealth: Payer: Self-pay

## 2021-12-03 ENCOUNTER — Ambulatory Visit (INDEPENDENT_AMBULATORY_CARE_PROVIDER_SITE_OTHER): Payer: Medicare Other

## 2021-12-03 DIAGNOSIS — Z7901 Long term (current) use of anticoagulants: Secondary | ICD-10-CM | POA: Diagnosis not present

## 2021-12-03 LAB — PROTIME-INR
INR: 12.7 ratio (ref 0.8–1.0)
Prothrombin Time: 118.5 s (ref 9.6–13.1)

## 2021-12-03 LAB — POCT INR: INR: 8 — AB (ref 2.0–3.0)

## 2021-12-03 NOTE — Telephone Encounter (Signed)
Placed form in Dr. G's box.  

## 2021-12-03 NOTE — Telephone Encounter (Signed)
Spoke with [name] and she discussed with me the protocol. The patient was seen in Warfarin clinic and send out INR was done. She will send in the Vitamin K and have patient come back to have the INR rechecked per office protocol

## 2021-12-03 NOTE — Progress Notes (Signed)
Pt reports some SOB and also reports he ran out of furosemide a week ago. Pt reports full feeling in abdomen. Pt does not weigh daily so he is unsure of wt gain. He has picked up the script last night and was advised to take as soon as he gets home.  Pt denies any bleeding or CP. Advised to go to ER but pt is refusing. Advised if SOB worsens to call 911. Pt verbalized understanding. Do not take any warfarin until you are contacted by coumadin clinic. If shortness of breath worsens or you develop new symptoms or you have any bleeding call 911.   Pt weight at apt on 8/27 was 297lb and his weight today is 311lb.

## 2021-12-03 NOTE — Telephone Encounter (Signed)
Patient dropped off application for renewal of placard. Left in Dr Darnell Level folder. Would like a call when its ready.

## 2021-12-03 NOTE — Telephone Encounter (Signed)
Critical results called by Santiago Glad at [facility] lab. Protime 118.5 INR 12.7  Notified in person - [employer].

## 2021-12-03 NOTE — Progress Notes (Signed)
Lab INR 12.7  Pt is to hold warfarin and take 1/2 tablet of vitamin K, 5 mg, tablet. Recheck on Monday, 8/14 at [facility].     Contacted Sam, pharmacist at [facility], and they do not have vitamin K who reported [facility] [address] [facility] [place] [facility]. Contacted pharmacist there and they do not have it. Contacted [facility] in [place] and they do not have it and the closest store that does is in [place]. Contacted [facility] and they do not have it either and advised to check [facility].  LVM with pt 1 hour ago to contact coumadin clinic on direct line. No response from VM. LVM 15 minutes ago to contact coumadin clinic on direct line. Advised to go to the ER if any signs or symptoms or abnormal bruising to call 911. Advised if he could not contact coumadin clinic to call the main clinic number to talk a triage nurse if questions on how to proceed. Left clinic number on VM. Contacted Hassan Rowan, pt's friend that was with him at his apt today. She is going to text him to try to get him. Advised her of the instructions above and that this nurse will f/u with pt tomorrow.  Contacted covering provider, Romilda Garret, NP, for additional instructions. Matt agreed with current plan of care and instructions given and left on VM.

## 2021-12-03 NOTE — Patient Instructions (Addendum)
Pre visit review using our clinic review tool, if applicable. No additional management support is needed unless otherwise documented below in the visit note.  Do not take any warfarin until you are contacted by coumadin clinic. If shortness of breath worsens or you develop new symptoms or you have any bleeding call 911.

## 2021-12-04 NOTE — Telephone Encounter (Signed)
Pt returned call and reports he is feeling better and his SOB is not resolved but doing better since taking his prescribed lasix. Pt denies any signs or symptoms of bleeding and no chest pain. Pt will continue lasix and hold warfarin with recheck of INR on Monday 8/14 @ 11:15. Lab will provide a POCT INR if possible and send the result to the coumadin clinic nurse.  Advised pt again to hold all warfarin until he is contacted on Monday. Advised since he has problems receiving calls, if he does not hear from the coumadin clinic by 2:30 on Monday to call to check in for the result and further instructions.  Advised if any signs or symptoms of bleeding or SOB worsens or he develops any other symptoms to call 911. Pt verbalized understanding.

## 2021-12-04 NOTE — Telephone Encounter (Signed)
Pt LVM last night that he is doing ok and SOB is not any worse. He has taken lasix and advised he followed all instructions left on VM. Denied any bleeding.   Could not send in vitamin K due to no local pharmacies had it in stock. Closest pharmacy is in [place]. Provider was made aware last night.   LVM for pt to return call.

## 2021-12-04 NOTE — Telephone Encounter (Signed)
Noted. Appreciate care of all involved.

## 2021-12-04 NOTE — Telephone Encounter (Signed)
Pt is to hold warfarin and take 1/2 tablet of vitamin K, 5 mg, tablet. Recheck on Monday, 8/14 at [facility].        Contacted Sam, pharmacist at [facility], and they do not have vitamin K who reported [facility] [address] [facility] [place] [facility] it. Contacted pharmacist there and they do not have it. Contacted [facility] in [place] and they do not have it and the closest store that does is in [place]. Contacted [facility] and they do not have it either and advised to check [facility].  LVM with pt 1 hour ago to contact coumadin clinic on direct line. No response from VM. LVM 15 minutes ago to contact coumadin clinic on direct line. Advised to go to the ER if any signs or symptoms or abnormal bruising to call 911. Advised if he could not contact coumadin clinic to call the main clinic number to talk a triage nurse if questions on how to proceed. Left clinic number on VM. Contacted Hassan Rowan, pt's friend that was with him at his apt today. She is going to text him to try to get him. Advised her of the instructions above and that this nurse will f/u with pt tomorrow.  Contacted covering provider, Romilda Garret, NP, for additional instructions. Matt agreed with current plan of care and instructions given and left on VM.

## 2021-12-07 ENCOUNTER — Other Ambulatory Visit: Payer: Medicare Other

## 2021-12-07 ENCOUNTER — Ambulatory Visit (INDEPENDENT_AMBULATORY_CARE_PROVIDER_SITE_OTHER): Payer: Medicare Other

## 2021-12-07 DIAGNOSIS — Z7901 Long term (current) use of anticoagulants: Secondary | ICD-10-CM

## 2021-12-07 LAB — POCT INR: INR: 2 (ref 2.0–3.0)

## 2021-12-07 NOTE — Telephone Encounter (Signed)
Pt in lab this morning for POCT INR which was 2.0  LVM for pt to return call.

## 2021-12-07 NOTE — Progress Notes (Signed)
INR at apt on 8/10 was 12.7, caused by pt not taking lasix and significant edema increase.  Pt was advised to hold warfarin and recheck in lab on Mon, 8/14 and wait for further instructions for warfarin.    Continue normal dosing. Take 1/2 tablet daily except take nothing on Wednesdays. Recheck in 1 week.  Contacted pt by phone and advised of dosing and recheck next Thursday. Scheduled pt for PCP apt for 8/18 at 3:30. Pt reports BLE edema is improved and he is taking lasix as prescribed but SOB remains the same and his abdomen still feels full. Pt denies any chest pain or any other symptoms. Advised pt if SOB or abdominal feeling worsens contact office or go to the ER. Pt verbalized understanding.

## 2021-12-07 NOTE — Telephone Encounter (Addendum)
Left message on vm per dpr notifying pt his disability parking placard form is ready to pick up.   [Placed form at front office.]

## 2021-12-07 NOTE — Telephone Encounter (Signed)
Filled and in lisa's box.

## 2021-12-07 NOTE — Patient Instructions (Addendum)
Pre visit review using our clinic review tool, if applicable. No additional management support is needed unless otherwise documented below in the visit note.  Continue normal dosing. Take 1/2 tablet daily except take nothing on Wednesdays. Recheck in 1 week.

## 2021-12-07 NOTE — Telephone Encounter (Signed)
Contacted pt for INR result from this morning and dosing instructions. Pt will restart prior dosing of warfarin, 2.5 mg daily except nothing on Wednesdays. Coumadin clinic apt on 8/24 for recheck. Scheduled pt for PCP apt for 8/18 at 3:30 because pt reports BLE edema has improved and he is taking lasix as prescribed but SOB remains the same and his abdomen still feels full. Pt denies any chest pain or any other symptoms. Advised pt if SOB or abdominal feeling worsens contact office or go to the ER. Pt verbalized understanding.

## 2021-12-11 ENCOUNTER — Encounter: Payer: Self-pay | Admitting: Family Medicine

## 2021-12-11 ENCOUNTER — Ambulatory Visit (INDEPENDENT_AMBULATORY_CARE_PROVIDER_SITE_OTHER): Payer: Medicare Other | Admitting: Family Medicine

## 2021-12-11 VITALS — BP 126/66 | HR 85 | Temp 97.7°F | Ht 71.0 in | Wt 309.5 lb

## 2021-12-11 DIAGNOSIS — G4733 Obstructive sleep apnea (adult) (pediatric): Secondary | ICD-10-CM

## 2021-12-11 DIAGNOSIS — J9601 Acute respiratory failure with hypoxia: Secondary | ICD-10-CM | POA: Diagnosis not present

## 2021-12-11 DIAGNOSIS — J9602 Acute respiratory failure with hypercapnia: Secondary | ICD-10-CM

## 2021-12-11 DIAGNOSIS — R0609 Other forms of dyspnea: Secondary | ICD-10-CM

## 2021-12-11 DIAGNOSIS — J449 Chronic obstructive pulmonary disease, unspecified: Secondary | ICD-10-CM

## 2021-12-11 DIAGNOSIS — J9611 Chronic respiratory failure with hypoxia: Secondary | ICD-10-CM | POA: Insufficient documentation

## 2021-12-11 DIAGNOSIS — I5022 Chronic systolic (congestive) heart failure: Secondary | ICD-10-CM

## 2021-12-11 DIAGNOSIS — J9621 Acute and chronic respiratory failure with hypoxia: Secondary | ICD-10-CM | POA: Insufficient documentation

## 2021-12-11 NOTE — Patient Instructions (Addendum)
Increase Breztri to 2 puffs twice daily.  We will refer you to lung doctor.  Call [facility] at [phone] to ask about scheduling home visit for respiratory therapy.  Call [facility] to buy new CPAP mask [phone] or [phone]. Work on new CPAP mask, we will work on setting oxygen up at home, with portable oxygen concentrator if possible Return in 1 month for follow up visit.

## 2021-12-11 NOTE — Assessment & Plan Note (Addendum)
Severe, anticipate OSA/OHVS.  Has been unable to get new CPAP mask for months now.  Has not yet been set up with home health.  Has not followed up with pulm. New referral placed.  #s provided to [facility] who has accepted him as well as to [facility] for him to call and schedule his appointments.

## 2021-12-11 NOTE — Assessment & Plan Note (Signed)
Continue lasix '80mg'$  bid, zaroxolyn 2.'5mg'$  twice weekly.  Consider budesonide given poor diuretic effect to date.  Discussed ER evaluation for IV diuresis if any worsening symptoms. He refused to go today.

## 2021-12-11 NOTE — Assessment & Plan Note (Signed)
Has been under-dosing breztri (1 puff daily) advised increase to 2 puffs BID.

## 2021-12-11 NOTE — Progress Notes (Signed)
Patient ID: [name], male    DOB: 1956/03/27, 66 y.o.   MRN: [id number]  This visit was conducted in person.  BP 126/66   Pulse 85   Temp 97.7 F (36.5 C) (Temporal)   Ht '5\' 11"'  (1.803 m)   Wt (!) 309 lb 8 oz (140.4 kg)   SpO2 (!) 79%   BMI 43.17 kg/m   Presented with pulse ox down to 70s%, did improve on its own to 85% with deep breaths, but needs supplemental oxygen at 2L [place] to maintain 92%. He states he feels well with this, breathing his normal. States home pulse ox consistently in the 70%. He refuses going to ER at this time.   CC: dyspnea  Subjective:   HPI: [name] is a 66 y.o. male presenting on 12/11/2021 for Shortness of Breath (C/o ongoing SOB.  Also, c/o feeling like stomach is full. )   See prior note for details.  Seen 11/20/2021 after multiple falls due to oversedation at home due to severe OSA with CPAP noncompliance (AHI 77 sleep [name] 06/2017). He has recently been in the process of restarting CPAP machine however he has poorly fitting mask and has not been able to reach DME for updated mask. Referred to [facility] - states he didn't hear about this. Continues falling asleep easily throughout the day.  Referred to Dr Mortimer Fries for OSA/CPAP management - no appt scheduled yet. New referral placed today.  COPD - continues breztri through [facility] but has been underdosing -only 1 puff once daily .   Recent coumadin INR up to 8. Latest INR back to 2.   Started Farxiga 51m daily 1 wk ago - this is also through [place] [facility].  Continues lasix 851mbid as well as metolazone 2.30m30mwice weekly.   Was previously on oxygen but he self discontinued this years ago. He's been using Boost portable oxygen canisters at home.      Relevant past medical, surgical, family and social history reviewed and updated as indicated. Interim medical history since our last visit reviewed. Allergies and medications reviewed and updated. Outpatient Medications Prior to Visit  Medication  Sig Dispense Refill   ACCU-CHEK FASTCLIX LANCETS MISC Check blood sugar once daily and as instructed. Dx 250.00 100 each 3   albuterol (PROVENTIL) (2.5 MG/3ML) 0.083% nebulizer solution INHALE 3 ML BY NEBULIZATION EVERY 6 HOURS AS NEEDED FOR WHEEZING OR SHORTNESS OF BREATH 150 mL 0   albuterol (VENTOLIN HFA) 108 (90 Base) MCG/ACT inhaler INHALE 2 PUFFS BY MOUTH EVERY 6 HOURS AS NEEDED FOR WHEEZE OR SHORTNESS OF BREATH 8.5 each 2   amiodarone (PACERONE) 200 MG tablet Take 1 tablet (200 mg total) by mouth daily. 90 tablet 3   Blood Glucose Monitoring Suppl (BLOOD GLUCOSE MONITOR SYSTEM) W/DEVICE KIT by Does not apply route. Use to check sugar once daily and as needed Dx: E11.9 **ONE TOUCH VERIO**     Budeson-Glycopyrrol-Formoterol (BREZTRI AEROSPHERE) 160-9-4.8 MCG/ACT AERO Inhale 2 puffs into the lungs 2 (two) times daily. 32.1 g 3   carvedilol (COREG) 6.25 MG tablet TAKE 1 TABLET BY MOUTH 2 TIMES DAILY WITH A MEAL. 180 tablet 0   cephALEXin (KEFLEX) 500 MG capsule Take 1 capsule (500 mg total) by mouth 4 (four) times daily. 28 capsule 0   Cholecalciferol (VITAMIN D) 50 MCG (2000 UT) CAPS Take 1 capsule (2,000 Units total) by mouth daily. (Patient taking differently: Take 2,000 Units by mouth daily.) 30 capsule  citalopram (CELEXA) 10 MG tablet Take 1 tablet (10 mg total) by mouth daily. 90 tablet 3   dapagliflozin propanediol (FARXIGA) 5 MG TABS tablet Take 1 tablet (5 mg total) by mouth daily before breakfast. 30 tablet 6   Docusate Calcium (STOOL SOFTENER PO) Take 1 tablet by mouth daily.     fluticasone (FLONASE) 50 MCG/ACT nasal spray SPRAY 2 SPRAYS INTO EACH NOSTRIL EVERY DAY (Patient taking differently: Place 1 spray into both nostrils daily as needed for allergies.) 48 mL 1   furosemide (LASIX) 80 MG tablet Take 1 tablet (80 mg total) by mouth 2 (two) times daily. 60 tablet 2   glimepiride (AMARYL) 4 MG tablet TAKE 1 TABLET BY MOUTH EVERY DAY WITH BREAKFAST 90 tablet 0   glucose blood  (ONETOUCH VERIO) test strip Check blood sugar 3 times a day 300 strip 3   hydrOXYzine (ATARAX) 50 MG tablet TAKE 1 TABLET (50 MG TOTAL) BY MOUTH 2 (TWO) TIMES DAILY AS NEEDED FOR ANXIETY. 180 tablet 1   ipratropium (ATROVENT HFA) 17 MCG/ACT inhaler Inhale 2 puffs into the lungs every 6 (six) hours as needed for wheezing. 1 each 12   metFORMIN (GLUCOPHAGE) 1000 MG tablet TAKE 1 TABLET (1,000 MG TOTAL) BY MOUTH 2 (TWO) TIMES DAILY WITH A MEAL. 180 tablet 0   metolazone (ZAROXOLYN) 2.5 MG tablet Take 1 tablet (2.5 mg total) by mouth 2 (two) times a week. 24 tablet 1   montelukast (SINGULAIR) 10 MG tablet TAKE 1 TABLET BY MOUTH EVERYDAY AT BEDTIME 90 tablet 3   NON FORMULARY 1,200 mg daily. SEA MOSS     NON FORMULARY daily. SUPER BEETS     NON FORMULARY 500 mg once. TURMERIC     pantoprazole (PROTONIX) 40 MG tablet TAKE 1 TABLET (40 MG TOTAL) BY MOUTH 2 (TWO) TIMES DAILY BEFORE A MEAL. 180 tablet 0   Probiotic Product (PROBIOTIC DAILY PO) Take 1 tablet by mouth daily.     simvastatin (ZOCOR) 20 MG tablet TAKE 1 TABLET BY MOUTH EVERY DAY IN THE EVENING 90 tablet 0   triamcinolone cream (KENALOG) 0.1 % Apply 1 application. topically 2 (two) times daily. Apply to AA. (Patient taking differently: Apply 1 application  topically 3 (three) times daily. Apply to AA.) 80 g 0   vitamin B-12 (CYANOCOBALAMIN) 1000 MCG tablet Take 1 tablet (1,000 mcg total) by mouth every Monday, Wednesday, and Friday. (Patient taking differently: Take 1,000 mcg by mouth daily.)     warfarin (COUMADIN) 5 MG tablet TAKE 1/2 TABLET BY MOUTH DAILY EXCEPT TAKE NOTHING ON WEDNESDAYS OR AS DIRECTED BY ANTICOAGULATION CLINIC 60 tablet 1   Facility-Administered Medications Prior to Visit  Medication Dose Route Frequency Provider Last Rate Last Admin   ferumoxytol (FERAHEME) 510 mg in sodium chloride 0.9 % 100 mL IVPB  510 mg Intravenous Weekly Sindy Guadeloupe, MD   Stopped at 10/23/21 1538     Per HPI unless specifically indicated in ROS  section below Review of Systems  Objective:  BP 126/66   Pulse 85   Temp 97.7 F (36.5 C) (Temporal)   Ht '5\' 11"'  (1.803 m)   Wt (!) 309 lb 8 oz (140.4 kg)   SpO2 (!) 79%   BMI 43.17 kg/m   Wt Readings from Last 3 Encounters:  12/11/21 (!) 309 lb 8 oz (140.4 kg)  11/20/21 294 lb 8 oz (133.6 kg)  11/10/21 290 lb (131.5 kg)      Physical Exam Vitals and nursing note reviewed.  Constitutional:      Appearance: He is obese. He is not ill-appearing.  Cardiovascular:     Rate and Rhythm: Normal rate and regular rhythm.     Pulses: Normal pulses.     Heart sounds: Normal heart sounds. No murmur heard. Pulmonary:     Effort: Pulmonary effort is normal. No respiratory distress.     Breath sounds: Normal breath sounds. No wheezing, rhonchi or rales.  Abdominal:     General: Abdomen is protuberant. There is distension.     Tenderness: There is no abdominal tenderness.     Hernia: A hernia is present. Hernia is present in the umbilical area.  Musculoskeletal:        General: Swelling present.     Right lower leg: Edema present.     Left lower leg: Edema present.  Skin:    General: Skin is warm and dry.     Findings: No rash.  Neurological:     Mental Status: He is alert.       Results for orders placed or performed in visit on 12/07/21  POCT INR  Result Value Ref Range   INR 2.0 2.0 - 3.0    Assessment & Plan:  I spent 60 minutes caring for this patient today face to face, reviewing labs, prior records from another facility, performing a medically appropriate examination and/or evaluation, counseling and educating the patient on risks of hypoxia, hypercapnea, documenting in the record and arranging for home health respiratory therapy, home DME supplemental oxygen.   Problem List Items Addressed This Visit     Obstructive sleep apnea    Severe, anticipate OSA/OHVS.  Has been unable to get new CPAP mask for months now.  Has not yet been set up with home health.  Has not  followed up with pulm. New referral placed.  #s provided to [facility] who has accepted him as well as to [facility] for him to call and schedule his appointments.       Relevant Orders   Ambulatory referral to Pulmonology   For home use only DME oxygen   Obesity, morbid, BMI 40.0-49.9 ([place])   Exertional dyspnea   Relevant Orders   Ambulatory referral to Pulmonology   For home use only DME oxygen   Chronic systolic CHF (congestive heart failure) (HCC)    Continue lasix 32m bid, zaroxolyn 2.577mtwice weekly.  Consider budesonide given poor diuretic effect to date.  Discussed ER evaluation for IV diuresis if any worsening symptoms. He refused to go today.       Relevant Orders   For home use only DME oxygen   COPD (ch[place] obstructive [place] disease) [place]   Has been under-dosing breztri (1 puff daily) advised increase to 2 puffs BID.       Relevant Orders   Ambulatory referral to Pulmonology   For home use only DME oxygen   Acute respiratory failure with hypoxia and hypercapnia ([place]    Chronic hypercapnea, with new hypoxia. Recommend ER evaluation - he declines as he doesn't feel ill. Will try to expedite home O2 - sent STAT request to start 2L continuous [place] at home, will also need portable oxygen concentrator for mobility. He is already a fall risk. Refer to pulm. Red flags to seek ER care reviewed.       Relevant Orders   Ambulatory referral to Pulmonology   For home use only DME oxygen     No orders of the defined  types were placed in this encounter.  Orders Placed This Encounter  Procedures   For home use only DME oxygen    STAT    Order Specific Question:   Length of Need    Answer:   Lifetime    Order Specific Question:   Mode or (Route)    Answer:   Nasal cannula    Order Specific Question:   Liters per Minute    Answer:   2    Order Specific Question:   Frequency    Answer:   Continuous (stationary and portable oxygen unit needed)    Order  Specific Question:   Oxygen conserving device    Answer:   Yes    Order Specific Question:   Oxygen delivery system    Answer:   Gas   Ambulatory referral to Pulmonology    Referral Priority:   Urgent    Referral Type:   Consultation    Referral Reason:   Specialty Services Required    Requested Specialty:   Pulmonary Disease    Number of Visits Requested:   1     Patient Instructions  Increase Breztri to 2 puffs twice daily.  We will refer you to lung doctor.  Call [facility] at [phone] to ask about scheduling home visit for respiratory therapy.  Call [facility] to buy new CPAP mask [phone] or [phone]. Work on new CPAP mask, we will work on setting oxygen up at home, with portable oxygen concentrator if possible Return in 1 month for follow up visit.   Follow up plan: Return in about 4 weeks (around 01/08/2022), or if symptoms worsen or fail to improve, for follow up visit.  [name], MD

## 2021-12-11 NOTE — Assessment & Plan Note (Signed)
Chronic hypercapnea, with new hypoxia. Recommend ER evaluation - he declines as he doesn't feel ill. Will try to expedite home O2 - sent STAT request to start 2L continuous [place] at home, will also need portable oxygen concentrator for mobility. He is already a fall risk. Refer to pulm. Red flags to seek ER care reviewed.

## 2021-12-14 DIAGNOSIS — J9601 Acute respiratory failure with hypoxia: Secondary | ICD-10-CM | POA: Diagnosis not present

## 2021-12-14 DIAGNOSIS — G4733 Obstructive sleep apnea (adult) (pediatric): Secondary | ICD-10-CM | POA: Diagnosis not present

## 2021-12-17 ENCOUNTER — Telehealth: Payer: Self-pay

## 2021-12-17 ENCOUNTER — Ambulatory Visit (INDEPENDENT_AMBULATORY_CARE_PROVIDER_SITE_OTHER): Payer: Medicare Other | Admitting: Family Medicine

## 2021-12-17 DIAGNOSIS — Z7901 Long term (current) use of anticoagulants: Secondary | ICD-10-CM | POA: Diagnosis not present

## 2021-12-17 LAB — POCT INR: INR: 2 (ref 2.0–3.0)

## 2021-12-17 NOTE — Patient Instructions (Addendum)
Pre visit review using our clinic review tool, if applicable. No additional management support is needed unless otherwise documented below in the visit note.  Continue 1/2 tablet daily except take nothing on Sundays. Recheck in 2 week.

## 2021-12-17 NOTE — Telephone Encounter (Signed)
Pt in today for coumadin clinic and requested to see if he could get an INR machine for home the time he has to come to the clinic. Advised this request would go to the provider and if he thinks it will be beneficial this nurse will f/u with him.    Pt also inquired about an oxygen concentrator. Advised a msg would be sent to the provider concerning that also.    Filled out order for INR and placed on PCP desk to sign if agreeable.

## 2021-12-17 NOTE — Progress Notes (Addendum)
Continue 1/2 tablet daily except take nothing on Sundays. Recheck in 2 week.   Pt reports he would like to try to get an INR machine for home use. Advised this request would go to the provider and if he thinks it will be beneficial this nurse will f/u with him.  Pt also inquired about an oxygen concentrator. Advised a msg would be sent to the provider concerning that also.

## 2021-12-21 NOTE — Telephone Encounter (Signed)
Signed home INR machine order form and returned to Daisy. The only concern I have is it is often difficult to get patient on the phone which may be a limitation for coumadin nurse. Fyi.   Lattie Haw can we check on home DME for patient? I ordered it STAT for last week. Would check with home supply company what we need to do to order portable oxygen concentrator for patient.

## 2021-12-21 NOTE — Telephone Encounter (Addendum)
Emailed form to [employer], Therapist, sports.   Also, sent Community message to [place] asking for update on STAT home oxygen order.

## 2021-12-21 NOTE — Telephone Encounter (Signed)
Received signed order for home INR machine. LVM for pt. Need to discuss his reliability with his phone.

## 2021-12-22 NOTE — Telephone Encounter (Signed)
Pt returned call but was with another pt.   Tried to call pt back but had to leave another VM.

## 2021-12-22 NOTE — Telephone Encounter (Signed)
Discussed difficulties with pt's phone and the importance of having a reliable form of communication in order for him to taking over INR testing at home. Pt reports he has a new phone but has not had it turned on yet. He is going to work on getting the new phone set up over the next week and will contact coumadin clinic when he has the new phone in service. Will then fax order for home INR machine. Pt verbalized understanding.   Have order for home INR machine but will not fax until pt advised he has set up his new phone.

## 2021-12-22 NOTE — Telephone Encounter (Signed)
A user error has taken place: encounter opened in error, closed for administrative reasons.

## 2021-12-22 NOTE — Telephone Encounter (Signed)
LVM

## 2021-12-23 ENCOUNTER — Ambulatory Visit (INDEPENDENT_AMBULATORY_CARE_PROVIDER_SITE_OTHER): Payer: Medicare Other | Admitting: Family Medicine

## 2021-12-23 ENCOUNTER — Encounter: Payer: Self-pay | Admitting: Family Medicine

## 2021-12-23 DIAGNOSIS — I4819 Other persistent atrial fibrillation: Secondary | ICD-10-CM

## 2021-12-23 DIAGNOSIS — E1169 Type 2 diabetes mellitus with other specified complication: Secondary | ICD-10-CM | POA: Diagnosis not present

## 2021-12-23 DIAGNOSIS — R0689 Other abnormalities of breathing: Secondary | ICD-10-CM | POA: Diagnosis not present

## 2021-12-23 DIAGNOSIS — K429 Umbilical hernia without obstruction or gangrene: Secondary | ICD-10-CM | POA: Diagnosis not present

## 2021-12-23 DIAGNOSIS — R1084 Generalized abdominal pain: Secondary | ICD-10-CM

## 2021-12-23 DIAGNOSIS — G4733 Obstructive sleep apnea (adult) (pediatric): Secondary | ICD-10-CM | POA: Diagnosis not present

## 2021-12-23 DIAGNOSIS — R296 Repeated falls: Secondary | ICD-10-CM

## 2021-12-23 DIAGNOSIS — R600 Localized edema: Secondary | ICD-10-CM

## 2021-12-23 DIAGNOSIS — J9601 Acute respiratory failure with hypoxia: Secondary | ICD-10-CM

## 2021-12-23 DIAGNOSIS — R0609 Other forms of dyspnea: Secondary | ICD-10-CM

## 2021-12-23 DIAGNOSIS — I5022 Chronic systolic (congestive) heart failure: Secondary | ICD-10-CM | POA: Diagnosis not present

## 2021-12-23 DIAGNOSIS — J9602 Acute respiratory failure with hypercapnia: Secondary | ICD-10-CM

## 2021-12-23 MED ORDER — TORSEMIDE 10 MG PO TABS: 10.0000 mg | ORAL_TABLET | Freq: Every day | ORAL | 3 refills | Status: DC

## 2021-12-23 NOTE — Patient Instructions (Addendum)
Call [facility] in [place] to schedule an appointment: [phone].  Use oxygen at night time as well as during the day.  Stop furosemide. Start in its place torsemide '10mg'$  daily. If not making urine, increase to '20mg'$  daily. Take in the morning.  Return in 2-3 weeks for follow up visits.

## 2021-12-23 NOTE — Progress Notes (Unsigned)
Patient ID: [name], male    DOB: 10/16/1955, 66 y.o.   MRN: [id number]  This visit was conducted in person.  BP 132/66   Pulse 69   Temp 98 F (36.7 C) (Temporal)   Ht '5\' 11"'  (1.803 m)   Wt (!) 311 lb (141.1 kg)   SpO2 90%   BMI 43.38 kg/m   Has new oxygen tank at home to use 2L [place] continuously. Did not bring in supplmental oxygen today, placed on office oxygen with improved O2 to 90%. He is not using oxygen at night time.   CC: follow up visit  Subjective:   HPI: [name] is a 66 y.o. male presenting on 12/23/2021 for Mass (C/o ongoing abd lump. Also, c/o leg weakness and reports he fell multiple times this AM. Also, c/o ongoing dyspnea. States O2 was delivered. )   See prior note for details.  Planning to use home machine for INR checks through our coumadin clinic.   Has still not seen pulm. Has not been able to buy new CPAP machine - told needed new sleep study. We have been trying to contact  Having some power issues at home over the past 2 days.   Ongoing falls due to unsteadiness, weakness. 6 falls this morning.   Last saw EP 08/2021 - Dr Quentin Ore - no longer in afib on last check. Coumadin started 09/2021.  Saw Nehemiah Massed 04/2021 - no f/u scheduled.      Relevant past medical, surgical, family and social history reviewed and updated as indicated. Interim medical history since our last visit reviewed. Allergies and medications reviewed and updated. Outpatient Medications Prior to Visit  Medication Sig Dispense Refill   ACCU-CHEK FASTCLIX LANCETS MISC Check blood sugar once daily and as instructed. Dx 250.00 100 each 3   albuterol (PROVENTIL) (2.5 MG/3ML) 0.083% nebulizer solution INHALE 3 ML BY NEBULIZATION EVERY 6 HOURS AS NEEDED FOR WHEEZING OR SHORTNESS OF BREATH 150 mL 0   albuterol (VENTOLIN HFA) 108 (90 Base) MCG/ACT inhaler INHALE 2 PUFFS BY MOUTH EVERY 6 HOURS AS NEEDED FOR WHEEZE OR SHORTNESS OF BREATH 8.5 each 2   amiodarone (PACERONE) 200 MG  tablet Take 1 tablet (200 mg total) by mouth daily. 90 tablet 3   Blood Glucose Monitoring Suppl (BLOOD GLUCOSE MONITOR SYSTEM) W/DEVICE KIT by Does not apply route. Use to check sugar once daily and as needed Dx: E11.9 **ONE TOUCH VERIO**     Budeson-Glycopyrrol-Formoterol (BREZTRI AEROSPHERE) 160-9-4.8 MCG/ACT AERO Inhale 2 puffs into the lungs 2 (two) times daily. 32.1 g 3   carvedilol (COREG) 6.25 MG tablet TAKE 1 TABLET BY MOUTH 2 TIMES DAILY WITH A MEAL. 180 tablet 0   Cholecalciferol (VITAMIN D) 50 MCG (2000 UT) CAPS Take 1 capsule (2,000 Units total) by mouth daily. (Patient taking differently: Take 2,000 Units by mouth daily.) 30 capsule    citalopram (CELEXA) 10 MG tablet Take 1 tablet (10 mg total) by mouth daily. 90 tablet 3   dapagliflozin propanediol (FARXIGA) 5 MG TABS tablet Take 1 tablet (5 mg total) by mouth daily before breakfast. 30 tablet 6   Docusate Calcium (STOOL SOFTENER PO) Take 1 tablet by mouth daily.     fluticasone (FLONASE) 50 MCG/ACT nasal spray SPRAY 2 SPRAYS INTO EACH NOSTRIL EVERY DAY (Patient taking differently: Place 1 spray into both nostrils daily as needed for allergies.) 48 mL 1   glimepiride (AMARYL) 4 MG tablet TAKE 1 TABLET BY MOUTH EVERY DAY WITH  BREAKFAST 90 tablet 0   glucose blood (ONETOUCH VERIO) test strip Check blood sugar 3 times a day 300 strip 3   hydrOXYzine (ATARAX) 50 MG tablet TAKE 1 TABLET (50 MG TOTAL) BY MOUTH 2 (TWO) TIMES DAILY AS NEEDED FOR ANXIETY. 180 tablet 1   ipratropium (ATROVENT HFA) 17 MCG/ACT inhaler Inhale 2 puffs into the lungs every 6 (six) hours as needed for wheezing. 1 each 12   metFORMIN (GLUCOPHAGE) 1000 MG tablet TAKE 1 TABLET (1,000 MG TOTAL) BY MOUTH 2 (TWO) TIMES DAILY WITH A MEAL. 180 tablet 0   metolazone (ZAROXOLYN) 2.5 MG tablet Take 1 tablet (2.5 mg total) by mouth 2 (two) times a week. 24 tablet 1   montelukast (SINGULAIR) 10 MG tablet TAKE 1 TABLET BY MOUTH EVERYDAY AT BEDTIME 90 tablet 3   NON FORMULARY 1,200  mg daily. SEA MOSS     NON FORMULARY daily. SUPER BEETS     NON FORMULARY 500 mg once. TURMERIC     pantoprazole (PROTONIX) 40 MG tablet TAKE 1 TABLET (40 MG TOTAL) BY MOUTH 2 (TWO) TIMES DAILY BEFORE A MEAL. 180 tablet 0   Probiotic Product (PROBIOTIC DAILY PO) Take 1 tablet by mouth daily.     simvastatin (ZOCOR) 20 MG tablet TAKE 1 TABLET BY MOUTH EVERY DAY IN THE EVENING 90 tablet 0   triamcinolone cream (KENALOG) 0.1 % Apply 1 application. topically 2 (two) times daily. Apply to AA. (Patient taking differently: Apply 1 application  topically 3 (three) times daily. Apply to AA.) 80 g 0   vitamin B-12 (CYANOCOBALAMIN) 1000 MCG tablet Take 1 tablet (1,000 mcg total) by mouth every Monday, Wednesday, and Friday. (Patient taking differently: Take 1,000 mcg by mouth daily.)     warfarin (COUMADIN) 5 MG tablet TAKE 1/2 TABLET BY MOUTH DAILY EXCEPT TAKE NOTHING ON WEDNESDAYS OR AS DIRECTED BY ANTICOAGULATION CLINIC 60 tablet 1   furosemide (LASIX) 80 MG tablet Take 1 tablet (80 mg total) by mouth 2 (two) times daily. 60 tablet 2   cephALEXin (KEFLEX) 500 MG capsule Take 1 capsule (500 mg total) by mouth 4 (four) times daily. 28 capsule 0   Facility-Administered Medications Prior to Visit  Medication Dose Route Frequency Provider Last Rate Last Admin   ferumoxytol (FERAHEME) 510 mg in sodium chloride 0.9 % 100 mL IVPB  510 mg Intravenous Weekly Sindy Guadeloupe, MD   Stopped at 10/23/21 1538     Per HPI unless specifically indicated in ROS section below Review of Systems  Objective:  BP 132/66   Pulse 69   Temp 98 F (36.7 C) (Temporal)   Ht '5\' 11"'  (1.803 m)   Wt (!) 311 lb (141.1 kg)   SpO2 90%   BMI 43.38 kg/m   Wt Readings from Last 3 Encounters:  12/23/21 (!) 311 lb (141.1 kg)  12/11/21 (!) 309 lb 8 oz (140.4 kg)  11/20/21 294 lb 8 oz (133.6 kg)      Physical Exam    Results for orders placed or performed in visit on 12/17/21  POCT INR  Result Value Ref Range   INR 2.0 2.0 -  3.0   Lab Results  Component Value Date   CREATININE 0.75 11/20/2021   BUN 11 11/20/2021   NA 140 11/20/2021   K 4.2 11/20/2021   CL 93 (L) 11/20/2021   CO2 42 (H) 11/20/2021    Lab Results  Component Value Date   ALT 11 08/26/2021   AST 15 08/26/2021  ALKPHOS 54 08/26/2021   BILITOT 0.5 08/26/2021   Lab Results  Component Value Date   WBC 10.4 11/20/2021   HGB 11.8 (L) 11/20/2021   HCT 39.6 11/20/2021   MCV 80.7 11/20/2021   PLT 249.0 11/20/2021    Lab Results  Component Value Date   HGBA1C 8.7 (H) 11/23/2021    Assessment & Plan:   Problem List Items Addressed This Visit   None    Meds ordered this encounter  Medications   torsemide (DEMADEX) 10 MG tablet    Sig: Take 1 tablet (10 mg total) by mouth daily.    Dispense:  30 tablet    Refill:  3    To replace furosemide   No orders of the defined types were placed in this encounter.    Patient Instructions  Call [facility] in [place] to schedule an appointment: [phone].  Use oxygen at night time as well as during the day.  Stop furosemide. Start in its place torsemide 40m daily. If not making urine, increase to 218mdaily. Take in the morning.  Return in 3-4 months for follow up visits.   Follow up plan: Return in about 4 weeks (around 01/20/2022) for follow up visit.  JaRia BushMD

## 2021-12-24 NOTE — Assessment & Plan Note (Signed)
Severe, anticipate with [facility].  Has not scheduled appt with pulm, has not been able to get CPAP as told he needed new sleep study. I gave him # to call pulmonology for appointment.

## 2021-12-24 NOTE — Assessment & Plan Note (Signed)
[place] referral placed last month, just recently established with [facility] and now has home oxygen to use. Advised use this regularly even at night time.

## 2021-12-24 NOTE — Assessment & Plan Note (Signed)
Saw Dr Quentin Ore earlier this summer, continues amiodarone '200mg'$  once daily, and now on coumadin through our coumadin clinic, discussing in-home INR monitoring.

## 2021-12-24 NOTE — Assessment & Plan Note (Signed)
Good UOP with lasix '80mg'$  bid and metolazone 2.'5mg'$  twice weekly however overall poor diuresis as evidenced by continued weight gain, exertional dyspnea, ascites.  Kidney function stable.  Will change to furosemide to torsemide '10mg'$  daily given poor overall diuresis, with option to increase to '20mg'$  daily. Update with effect after 1-2 wks.

## 2021-12-24 NOTE — Assessment & Plan Note (Signed)
Edematous umbilical hernia from ascites. See below for diuresis plan.

## 2021-12-24 NOTE — Assessment & Plan Note (Signed)
[name] started earlier this month. Continues amaryl and metformin. Latest A1c prior to farxiga 8.7%.

## 2021-12-24 NOTE — Assessment & Plan Note (Addendum)
Recurrent falls multifactorial including daytime somnolence, hypercapnia, generalized unsteadiness and imbalance.  [place] referral placed this past month.

## 2021-12-26 ENCOUNTER — Other Ambulatory Visit: Payer: Self-pay | Admitting: Family Medicine

## 2021-12-28 ENCOUNTER — Emergency Department: Payer: Medicare Other

## 2021-12-28 ENCOUNTER — Other Ambulatory Visit: Payer: Self-pay

## 2021-12-28 ENCOUNTER — Inpatient Hospital Stay
Admission: EM | Admit: 2021-12-28 | Discharge: 2021-12-28 | DRG: 291 | Discharge status: Against Medical Advice | Payer: Medicare Other | Attending: Internal Medicine | Admitting: Internal Medicine

## 2021-12-28 ENCOUNTER — Encounter: Payer: Self-pay | Admitting: Emergency Medicine

## 2021-12-28 DIAGNOSIS — G4733 Obstructive sleep apnea (adult) (pediatric): Secondary | ICD-10-CM | POA: Diagnosis present

## 2021-12-28 DIAGNOSIS — Z7901 Long term (current) use of anticoagulants: Secondary | ICD-10-CM

## 2021-12-28 DIAGNOSIS — Z7951 Long term (current) use of inhaled steroids: Secondary | ICD-10-CM | POA: Diagnosis not present

## 2021-12-28 DIAGNOSIS — Z79899 Other long term (current) drug therapy: Secondary | ICD-10-CM | POA: Diagnosis not present

## 2021-12-28 DIAGNOSIS — R296 Repeated falls: Secondary | ICD-10-CM | POA: Diagnosis not present

## 2021-12-28 DIAGNOSIS — I110 Hypertensive heart disease with heart failure: Principal | ICD-10-CM | POA: Diagnosis present

## 2021-12-28 DIAGNOSIS — Z888 Allergy status to other drugs, medicaments and biological substances status: Secondary | ICD-10-CM | POA: Diagnosis not present

## 2021-12-28 DIAGNOSIS — R079 Chest pain, unspecified: Secondary | ICD-10-CM | POA: Diagnosis not present

## 2021-12-28 DIAGNOSIS — G47419 Narcolepsy without cataplexy: Secondary | ICD-10-CM | POA: Diagnosis present

## 2021-12-28 DIAGNOSIS — I4891 Unspecified atrial fibrillation: Secondary | ICD-10-CM | POA: Diagnosis not present

## 2021-12-28 DIAGNOSIS — I4892 Unspecified atrial flutter: Secondary | ICD-10-CM | POA: Diagnosis present

## 2021-12-28 DIAGNOSIS — I700 Atherosclerosis of aorta: Secondary | ICD-10-CM | POA: Diagnosis not present

## 2021-12-28 DIAGNOSIS — Z833 Family history of diabetes mellitus: Secondary | ICD-10-CM

## 2021-12-28 DIAGNOSIS — E8729 Other acidosis: Secondary | ICD-10-CM | POA: Diagnosis not present

## 2021-12-28 DIAGNOSIS — Z8679 Personal history of other diseases of the circulatory system: Secondary | ICD-10-CM

## 2021-12-28 DIAGNOSIS — R0902 Hypoxemia: Secondary | ICD-10-CM | POA: Insufficient documentation

## 2021-12-28 DIAGNOSIS — I5033 Acute on chronic diastolic (congestive) heart failure: Secondary | ICD-10-CM | POA: Diagnosis not present

## 2021-12-28 DIAGNOSIS — K429 Umbilical hernia without obstruction or gangrene: Secondary | ICD-10-CM | POA: Diagnosis not present

## 2021-12-28 DIAGNOSIS — E278 Other specified disorders of adrenal gland: Secondary | ICD-10-CM

## 2021-12-28 DIAGNOSIS — E785 Hyperlipidemia, unspecified: Secondary | ICD-10-CM | POA: Diagnosis present

## 2021-12-28 DIAGNOSIS — I5032 Chronic diastolic (congestive) heart failure: Secondary | ICD-10-CM | POA: Diagnosis present

## 2021-12-28 DIAGNOSIS — J9690 Respiratory failure, unspecified, unspecified whether with hypoxia or hypercapnia: Secondary | ICD-10-CM | POA: Diagnosis not present

## 2021-12-28 DIAGNOSIS — Z8249 Family history of ischemic heart disease and other diseases of the circulatory system: Secondary | ICD-10-CM | POA: Diagnosis not present

## 2021-12-28 DIAGNOSIS — I420 Dilated cardiomyopathy: Secondary | ICD-10-CM | POA: Diagnosis not present

## 2021-12-28 DIAGNOSIS — J984 Other disorders of lung: Secondary | ICD-10-CM | POA: Diagnosis not present

## 2021-12-28 DIAGNOSIS — Z6841 Body Mass Index (BMI) 40.0 and over, adult: Secondary | ICD-10-CM | POA: Diagnosis not present

## 2021-12-28 DIAGNOSIS — Z87891 Personal history of nicotine dependence: Secondary | ICD-10-CM | POA: Diagnosis not present

## 2021-12-28 DIAGNOSIS — Z043 Encounter for examination and observation following other accident: Secondary | ICD-10-CM | POA: Diagnosis not present

## 2021-12-28 DIAGNOSIS — Z9981 Dependence on supplemental oxygen: Secondary | ICD-10-CM

## 2021-12-28 DIAGNOSIS — Z7984 Long term (current) use of oral hypoglycemic drugs: Secondary | ICD-10-CM | POA: Diagnosis not present

## 2021-12-28 DIAGNOSIS — S299XXA Unspecified injury of thorax, initial encounter: Secondary | ICD-10-CM | POA: Diagnosis not present

## 2021-12-28 DIAGNOSIS — J449 Chronic obstructive pulmonary disease, unspecified: Secondary | ICD-10-CM | POA: Diagnosis not present

## 2021-12-28 DIAGNOSIS — S0990XA Unspecified injury of head, initial encounter: Secondary | ICD-10-CM | POA: Diagnosis not present

## 2021-12-28 DIAGNOSIS — K5730 Diverticulosis of large intestine without perforation or abscess without bleeding: Secondary | ICD-10-CM | POA: Diagnosis not present

## 2021-12-28 DIAGNOSIS — I719 Aortic aneurysm of unspecified site, without rupture: Secondary | ICD-10-CM | POA: Diagnosis not present

## 2021-12-28 DIAGNOSIS — I2510 Atherosclerotic heart disease of native coronary artery without angina pectoris: Secondary | ICD-10-CM | POA: Diagnosis not present

## 2021-12-28 DIAGNOSIS — J9811 Atelectasis: Secondary | ICD-10-CM | POA: Diagnosis not present

## 2021-12-28 DIAGNOSIS — E279 Disorder of adrenal gland, unspecified: Secondary | ICD-10-CM

## 2021-12-28 DIAGNOSIS — J9612 Chronic respiratory failure with hypercapnia: Secondary | ICD-10-CM | POA: Diagnosis not present

## 2021-12-28 DIAGNOSIS — I714 Abdominal aortic aneurysm, without rupture, unspecified: Secondary | ICD-10-CM | POA: Diagnosis present

## 2021-12-28 DIAGNOSIS — R0789 Other chest pain: Secondary | ICD-10-CM | POA: Diagnosis not present

## 2021-12-28 DIAGNOSIS — G894 Chronic pain syndrome: Secondary | ICD-10-CM | POA: Diagnosis present

## 2021-12-28 DIAGNOSIS — J811 Chronic pulmonary edema: Secondary | ICD-10-CM | POA: Diagnosis not present

## 2021-12-28 DIAGNOSIS — S3991XA Unspecified injury of abdomen, initial encounter: Secondary | ICD-10-CM | POA: Diagnosis not present

## 2021-12-28 DIAGNOSIS — E669 Obesity, unspecified: Secondary | ICD-10-CM | POA: Diagnosis present

## 2021-12-28 DIAGNOSIS — I509 Heart failure, unspecified: Secondary | ICD-10-CM | POA: Diagnosis not present

## 2021-12-28 DIAGNOSIS — E1169 Type 2 diabetes mellitus with other specified complication: Secondary | ICD-10-CM | POA: Diagnosis present

## 2021-12-28 HISTORY — DX: Other specified disorders of adrenal gland: E27.8

## 2021-12-28 HISTORY — DX: Disorder of adrenal gland, unspecified: E27.9

## 2021-12-28 LAB — BASIC METABOLIC PANEL
Anion gap: 12 (ref 5–15)
BUN: 22 mg/dL (ref 8–23)
CO2: 37 mmol/L — ABNORMAL HIGH (ref 22–32)
Calcium: 9.1 mg/dL (ref 8.9–10.3)
Chloride: 88 mmol/L — ABNORMAL LOW (ref 98–111)
Creatinine, Ser: 0.94 mg/dL (ref 0.61–1.24)
GFR, Estimated: >60 mL/min (ref >60)
Glucose, Bld: 140 mg/dL — ABNORMAL HIGH (ref 70–99)
Potassium: 3.6 mmol/L (ref 3.5–5.1)
Sodium: 137 mmol/L (ref 135–145)

## 2021-12-28 LAB — MAGNESIUM: Magnesium: 1.9 mg/dL (ref 1.7–2.4)

## 2021-12-28 LAB — CBC
HCT: 39.1 % (ref 39.0–52.0)
Hemoglobin: 10.9 g/dL — ABNORMAL LOW (ref 13.0–17.0)
MCH: 21.8 pg — ABNORMAL LOW (ref 26.0–34.0)
MCHC: 27.9 g/dL — ABNORMAL LOW (ref 30.0–36.0)
MCV: 78.4 fL — ABNORMAL LOW (ref 80.0–100.0)
Platelets: 307 10*3/uL (ref 150–400)
RBC: 4.99 MIL/uL (ref 4.22–5.81)
RDW: 21.1 % — ABNORMAL HIGH (ref 11.5–15.5)
WBC: 9.6 10*3/uL (ref 4.0–10.5)
nRBC: 0 % (ref 0.0–0.2)

## 2021-12-28 LAB — TROPONIN I (HIGH SENSITIVITY): Troponin I (High Sensitivity): 14 ng/L (ref <18)

## 2021-12-28 LAB — BLOOD GAS, VENOUS
Acid-Base Excess: 21.5 mmol/L — ABNORMAL HIGH (ref 0.0–2.0)
Bicarbonate: 52.5 mmol/L — ABNORMAL HIGH (ref 20.0–28.0)
O2 Saturation: 29 %
Patient temperature: 37
pCO2, Ven: 93 mmHg (ref 44–60)
pH, Ven: 7.36 (ref 7.25–7.43)
pO2, Ven: <31 mmHg — CL (ref 32–45)

## 2021-12-28 LAB — PROTIME-INR
INR: 1.4 — ABNORMAL HIGH (ref 0.8–1.2)
Prothrombin Time: 17 seconds — ABNORMAL HIGH (ref 11.4–15.2)

## 2021-12-28 MED ORDER — PANTOPRAZOLE SODIUM 40 MG PO TBEC
40.0000 mg | DELAYED_RELEASE_TABLET | Freq: Two times a day (BID) | ORAL | Status: DC
Start: 2021-12-28 — End: 2021-12-28

## 2021-12-28 MED ORDER — BUDESON-GLYCOPYRROL-FORMOTEROL 160-9-4.8 MCG/ACT IN AERO: 2.0000 | INHALATION_SPRAY | Freq: Two times a day (BID) | RESPIRATORY_TRACT | Status: DC

## 2021-12-28 MED ORDER — SODIUM CHLORIDE 0.9% FLUSH: 3.0000 mL | INTRAVENOUS | Status: DC | PRN

## 2021-12-28 MED ORDER — ENOXAPARIN SODIUM 40 MG/0.4ML IJ SOSY: 40.0000 mg | PREFILLED_SYRINGE | INTRAMUSCULAR | Status: DC

## 2021-12-28 MED ORDER — METOLAZONE 2.5 MG PO TABS: 2.5000 mg | ORAL_TABLET | ORAL | Status: DC

## 2021-12-28 MED ORDER — INSULIN ASPART 100 UNIT/ML IJ SOLN: 0.0000 [IU] | Freq: Three times a day (TID) | INTRAMUSCULAR | Status: DC

## 2021-12-28 MED ORDER — ONDANSETRON HCL 4 MG/2ML IJ SOLN: 4.0000 mg | Freq: Four times a day (QID) | INTRAMUSCULAR | Status: DC | PRN

## 2021-12-28 MED ORDER — AMIODARONE HCL 200 MG PO TABS: 200.0000 mg | ORAL_TABLET | Freq: Every day | ORAL | Status: DC

## 2021-12-28 MED ORDER — SODIUM CHLORIDE 0.9 % IV SOLN: 250.0000 mL | INTRAVENOUS | Status: DC | PRN

## 2021-12-28 MED ORDER — SIMVASTATIN 10 MG PO TABS
20.0000 mg | ORAL_TABLET | Freq: Every day | ORAL | Status: DC
Start: 2021-12-28 — End: 2021-12-28

## 2021-12-28 MED ORDER — CITALOPRAM HYDROBROMIDE 20 MG PO TABS: 10.0000 mg | ORAL_TABLET | Freq: Every day | ORAL | Status: DC

## 2021-12-28 MED ORDER — ALBUTEROL SULFATE (2.5 MG/3ML) 0.083% IN NEBU: 2.5000 mg | INHALATION_SOLUTION | RESPIRATORY_TRACT | Status: DC | PRN

## 2021-12-28 MED ORDER — CARVEDILOL 6.25 MG PO TABS
6.2500 mg | ORAL_TABLET | Freq: Two times a day (BID) | ORAL | Status: DC
Start: 2021-12-28 — End: 2021-12-28

## 2021-12-28 MED ORDER — ACETAMINOPHEN 325 MG PO TABS: 650.0000 mg | ORAL_TABLET | Freq: Four times a day (QID) | ORAL | Status: DC | PRN

## 2021-12-28 MED ORDER — MONTELUKAST SODIUM 10 MG PO TABS
10.0000 mg | ORAL_TABLET | Freq: Every day | ORAL | Status: DC
Start: 2021-12-28 — End: 2021-12-28

## 2021-12-28 MED ORDER — ACETAMINOPHEN 325 MG RE SUPP: 650.0000 mg | Freq: Four times a day (QID) | RECTAL | Status: DC | PRN

## 2021-12-28 MED ORDER — IOHEXOL 300 MG/ML  SOLN
100.0000 mL | Freq: Once | INTRAMUSCULAR | Status: AC | PRN
  Administered 2021-12-28: 100 mL via INTRAVENOUS

## 2021-12-28 MED ORDER — SODIUM CHLORIDE 0.9% FLUSH: 3.0000 mL | Freq: Two times a day (BID) | INTRAVENOUS | Status: DC

## 2021-12-28 MED ORDER — FUROSEMIDE 10 MG/ML IJ SOLN: 40.0000 mg | Freq: Every day | INTRAMUSCULAR | Status: DC

## 2021-12-28 MED ORDER — WARFARIN SODIUM 5 MG PO TABS: 5.0000 mg | ORAL_TABLET | Freq: Every day | ORAL | Status: DC

## 2021-12-28 MED ORDER — LIDOCAINE 5 % EX PTCH
1.0000 | MEDICATED_PATCH | CUTANEOUS | Status: DC
  Administered 2021-12-28: 1 via TRANSDERMAL
  Filled 2021-12-28: qty 1

## 2021-12-28 MED ORDER — FUROSEMIDE 10 MG/ML IJ SOLN
40.0000 mg | Freq: Once | INTRAMUSCULAR | Status: AC
  Administered 2021-12-28: 40 mg via INTRAVENOUS
  Filled 2021-12-28: qty 4

## 2021-12-28 MED ORDER — HYDROXYZINE HCL 25 MG PO TABS: 50.0000 mg | ORAL_TABLET | Freq: Two times a day (BID) | ORAL | Status: DC | PRN

## 2021-12-28 MED ORDER — ONDANSETRON HCL 4 MG PO TABS: 4.0000 mg | ORAL_TABLET | Freq: Four times a day (QID) | ORAL | Status: DC | PRN

## 2021-12-28 NOTE — ED Notes (Signed)
Pt trying to find ride home

## 2021-12-28 NOTE — Assessment & Plan Note (Signed)
Secondary to morbid obesity with a BMI of 40.45 CPAP at bedtime

## 2021-12-28 NOTE — ED Notes (Signed)
Pt assisted with urinal, external cath placed. Pt desat to 80% with movement to stretcher, O2 increased to 4 L [place]. Pt sitting upright in bed, states cannot breathe when lying flat.

## 2021-12-28 NOTE — ED Notes (Signed)
See triage note, pt reports fell asleep and hit chest on counter. C/o chest pain. +shob, states is always shob d/t COPD. States wears [place] sometimes but "has a life to live". Redness and swelling noted to BLE.  Per family at bedside pt "falls asleep"all the time, even when standing.  Pt alert and oriented. Currently on 2 L [place]. Labored breathing noted.

## 2021-12-28 NOTE — Assessment & Plan Note (Addendum)
Continue amiodarone and carvedilol for rate control Patient is on Coumadin as primary prophylaxis for an acute stroke INR is subtherapeutic Continue Coumadin Daily PT/INR

## 2021-12-28 NOTE — ED Notes (Signed)
Pt d/c AMA with friend Konrad Dolores.

## 2021-12-28 NOTE — ED Notes (Signed)
Pt to CT

## 2021-12-28 NOTE — Assessment & Plan Note (Signed)
Secondary to narcolepsy Supportive care

## 2021-12-28 NOTE — Assessment & Plan Note (Addendum)
BMI 99.69 Complicates overall prognosis and care Lifestyle modification and exercise has been discussed with patient in detail

## 2021-12-28 NOTE — ED Notes (Signed)
Pt spO2 89-92% while sleeping, [place] increased to 4L

## 2021-12-28 NOTE — Assessment & Plan Note (Addendum)
Patient with a history of respiratory failure secondary to COPD, obesity hypoventilation syndrome and obstructive sleep apnea Presents to the ER for evaluation following a fall at home and is found to have compensated respiratory acidosis. He was hypoxic with room air pulse oximetry of 74 and on 2 L his pulse oximetry improved to greater than 92%. He was ambulated in the ER and per documentation pulse oximetry dropped even on 2 L post ambulation Per patient he was recently started on home oxygen but has not been using it as recommended He is also supposed to be on a CPAP at home but has not been using it as well since it is broken. Patient states that he has called to be scheduled for sleep study and is waiting for an appointment. Discussed in detail with patient that based on his hypercapnia he will likely qualify for a trilogy. We will consult pulmonology in a.m. for further recommendations Continue oxygen supplementation to maintain pulse oximetry greater than 92%

## 2021-12-28 NOTE — ED Triage Notes (Signed)
Pt via EMS from home. States that he fell asleep on a bench, fell forward and then his head and chest on the corner of a bathroom kitchen sink. States that his head does not hurt but his chest does and it is painful to take a deep breath. States that he is on Warfarin. Pt is A&Ox4 and NAD. RA sat read 76%, Pt states "it is always that low" pt wear O2 2L as needed. Placed pt on 2L in triage.

## 2021-12-28 NOTE — Assessment & Plan Note (Addendum)
Blood sugars are stable Hold oral hypoglycemic agents Sliding scale coverage and Accu-Cheks before meals and at bedtime Maintain consistent carbohydrate diet

## 2021-12-28 NOTE — Assessment & Plan Note (Addendum)
Stable Continue Lasix, metolazone and carvedilol

## 2021-12-28 NOTE — ED Provider Notes (Signed)
[facility] Provider Note    Event Date/Time   First MD Initiated Contact with Patient 12/28/21 1015     (approximate)   History   Chief Complaint Fall (/) and Chest Pain   HPI  [name] is a 66 y.o. male with past medical history of hypertension, diabetes, CAD, atrial fibrillation on Coumadin, CHF, COPD, AAA, and chronic pain syndrome who presents to the ED complaining of fall.  Patient reports that he was sitting in a chair in his bathroom when he fell asleep, causing him to fall forward and strike his head and chest on the sink.  He denies losing consciousness and also denies any pain in his head or neck.  He does report significant pain in the center of his chest over his sternum.  He states the pain is sharp and makes it difficult for him to take a deep breath.  He denies any pain in his abdomen.  He was noted to be hypoxic into the mid 70s in triage, reports he was started on 2 L nasal cannula about 4 days ago by his PCP, but wears this on an as-needed basis.  He states his breathing feels no worse than usual, states he is chronically short of breath.  He has not had any fevers or cough, but does admit to some swelling in his legs and abdomen.     Physical Exam   Triage Vital Signs: ED Triage Vitals  Enc Vitals Group     BP 12/28/21 1018 129/81     Pulse Rate 12/28/21 1018 63     Resp 12/28/21 1024 17     Temp 12/28/21 1030 97.7 F (36.5 C)     Temp Source 12/28/21 1030 Oral     SpO2 12/28/21 1018 95 %     Weight 12/28/21 1014 290 lb (131.5 kg)     Height 12/28/21 1014 '5\' 11"'$  (1.803 m)     Head Circumference --      Peak Flow --      Pain Score 12/28/21 1014 4     Pain Loc --      Pain Edu? --      Excl. in [place]? --     Most recent vital signs: Vitals:   12/28/21 1300 12/28/21 1330  BP: 137/78 (!) 168/90  Pulse: 63 75  Resp: 18 17  Temp:    SpO2: 97% 96%    Constitutional: Alert and oriented. Eyes: Conjunctivae are  normal. Head: Atraumatic. Nose: No congestion/rhinnorhea. Mouth/Throat: Mucous membranes are moist.  Neck: No midline cervical spine tenderness to palpation. Cardiovascular: Normal rate, regular rhythm. Grossly normal heart sounds.  2+ radial pulses bilaterally. Respiratory: Normal respiratory effort.  No retractions. Lungs CTAB.  Tenderness to palpation over mid sternum, no lateral chest wall tenderness to palpation. Gastrointestinal: Soft and nontender. Mildly distended. Musculoskeletal: No lower extremity tenderness, 1+ pitting edema to knees bilaterally. Neurologic:  Normal speech and language. No gross focal neurologic deficits are appreciated.    ED Results / Procedures / Treatments   Labs (all labs ordered are listed, but only abnormal results are displayed) Labs Reviewed  BASIC METABOLIC PANEL - Abnormal; Notable for the following components:      Result Value   Chloride 88 (*)    CO2 37 (*)    Glucose, Bld 140 (*)    All other components within normal limits  CBC - Abnormal; Notable for the following components:   Hemoglobin 10.9 (*)  MCV 78.4 (*)    MCH 21.8 (*)    MCHC 27.9 (*)    RDW 21.1 (*)    All other components within normal limits  PROTIME-INR - Abnormal; Notable for the following components:   Prothrombin Time 17.0 (*)    INR 1.4 (*)    All other components within normal limits  MAGNESIUM  BLOOD GAS, VENOUS  TROPONIN I (HIGH SENSITIVITY)     EKG  ED ECG REPORT I, Blake Divine, the attending physician, personally viewed and interpreted this ECG.   Date: 12/28/2021  EKG Time: 10:20  Rate: 64  Rhythm: normal sinus rhythm  Axis: RAD  Intervals: Prolonged QT  ST&T Change: None  RADIOLOGY Chest x-ray reviewed and interpreted by me with no obvious fracture, mild pulmonary edema noted with no focal infiltrate.  PROCEDURES:  Critical Care performed: No  Procedures   MEDICATIONS ORDERED IN ED: Medications  lidocaine (LIDODERM) 5 % 1 patch  (1 patch Transdermal Patch Applied 12/28/21 1117)  iohexol (OMNIPAQUE) 300 MG/ML solution 100 mL (100 mLs Intravenous Contrast Given 12/28/21 1139)  furosemide (LASIX) injection 40 mg (40 mg Intravenous Given 12/28/21 1307)     IMPRESSION / MDM / ASSESSMENT AND PLAN / ED COURSE  I reviewed the triage vital signs and the nursing notes.                              66 y.o. male with past medical history of hypertension, diabetes, CAD, CHF, atrial fibrillation on Coumadin, AAA, COPD, and chronic pain syndrome who presents to the ED following fall just prior to arrival where he fell asleep and fell forward, striking his head and chest on the sink.  Patient's presentation is most consistent with acute presentation with potential threat to life or bodily function.  Differential diagnosis includes, but is not limited to, intracranial injury, cervical spine injury, rib fracture, sternal fracture, hemothorax, pneumothorax, CHF, COPD.  Patient chronically ill-appearing but in no acute distress, hypoxia improved now that he is on 2 L nasal cannula.  He does appear clinically fluid overloaded and chest x-ray is concerning for pulmonary edema, likely contributing to his hypoxia and chronic shortness of breath.  Given he is anticoagulated on Coumadin, we will check CT head, cervical spine, and chest/abdomen/pelvis for traumatic injury.  Labs are pending at this time.  CT head and cervical spine are negative for acute traumatic injury, CT of chest/abdomen/pelvis does show a possible small effusion as well as atelectasis versus pneumonia.  Patient continues to have significant difficulty ambulating on his own, noted to drop oxygen saturations despite wearing his 2 L nasal cannula.  With his multiple falls and apparent fluid overload with CHF exacerbation, we will diurese with IV Lasix and case discussed with hospitalist for admission.  Labs are reassuring with no significant anemia, leukocytosis, electrolyte  abnormality, or AKI.  Troponin within normal limits and I doubt ACS, chest pain appears secondary to contusion.      FINAL CLINICAL IMPRESSION(S) / ED DIAGNOSES   Final diagnoses:  Chest wall pain  Multiple falls  Acute on chronic diastolic congestive heart failure ([place])     Rx / DC Orders   ED Discharge Orders     None        Note:  This document was prepared using Dragon voice recognition software and may include unintentional dictation errors.   Blake Divine, MD 12/28/21 [id number]

## 2021-12-28 NOTE — H&P (Signed)
History and Physical    Patient: [name]:[id number] DOB: 1955/07/02 DOA: 12/28/2021 DOS: the patient was seen and examined on 12/28/2021 PCP: [name], MD  Patient coming from: Home  Chief Complaint:  Chief Complaint  Patient presents with   Fall        Chest Pain   HPI: [name] is a 66 y.o. male with medical history significant for morbid obesity (BMI 40), history of obstructive sleep apnea supposed to be on CPAP but it is broken, diabetes mellitus, chronic diastolic dysfunction CHF, coronary artery disease, COPD, chronic respiratory failure recently started on 2 L of oxygen but has not been as compliant as he needs to be, history of narcolepsy who presents to the ER via EMS for evaluation after he fell. Patient states that he was sitting on a bench in his bathroom looking at the window and fell asleep.  He fell forward and hit his head and chest on the corner of the bathroom sink.  He called EMS because he was concerned he may have hurt himself due to pain in his chest which was initially pruritic.  He was noted to have room air pulse oximetry of 76% and was placed on oxygen in triage with improvement in his pulse oximetry. Patient and his friend at the bedside state that he has had multiple falls at home related to his narcolepsy.  He was recently started on oxygen for hypoxia and states that his CPAP is broken and he has called the company to get the parts replaced.  He also states that he knows he needs a repeat sleep study and has called for an appointment but does not have a date yet. He has shortness of breath that is unchanged from his baseline and does have some lower extremity swelling.  He denies having any fever, no chills, no headache, no cough, no nausea, no vomiting, no abdominal pain, no changes in his bowel habits, no urinary symptoms no dizziness, no lightheadedness, no blurred vision or any focal deficit. On 2 L patient was ambulated in the ER and  was said to have desaturated and was placed back on 4 L of oxygen.  Review of Systems: As mentioned in the history of present illness. All other systems reviewed and are negative. Past Medical History:  Diagnosis Date   Abnormal drug screen 2015   MJ positive x3 - one more and we will stop prescribing ativan (08/2013).   Anemia    Angina    Anxiety    Arthritis    CAD (coronary artery disease)    nonobstructive   Cannabis abuse    Chronic systolic CHF (congestive heart failure) ([place]) 2013   NYHA Class II/III   COPD (chronic obstructive pulmonary disease) ([place])    Diabetes type 2, uncontrolled    declines DSME   Dilated cardiomyopathy ([place]) 2013   now improved   Ex-smoker    Frequent headaches    History of atrial fibrillation 2013   chronic, s/p ablation prior on coumadin   Hyperlipidemia    Migraine    Nonischemic cardiomyopathy ([place]) 2013   EF 25% per Dr Nehemiah Massed   Obesity    OSA (obstructive sleep apnea)    does not use CPAP - unable to tolerate   Seasonal allergies    Past Surgical History:  Procedure Laterality Date   ATRIAL FLUTTER ABLATION N/A 09/21/2011   Procedure: ATRIAL FLUTTER ABLATION;  Surgeon: Thompson Grayer, MD;  Location: [facility] CATH LAB;  Service: Cardiovascular;  Laterality: N/A;   CARDIAC ELECTROPHYSIOLOGY MAPPING AND ABLATION  2013   for atrial flutter   CARDIOVASCULAR STRESS TEST  03/2012   ETT WNL Nehemiah Massed)   CARDIOVERSION N/A 08/12/2021   Procedure: CARDIOVERSION;  Surgeon: Corey Skains, MD;  Location: [facility] ORS;  Service: Cardiovascular;  Laterality: N/A;   COLONOSCOPY WITH PROPOFOL N/A 05/22/2020   TA, diverticulosis, descending colon ulcer biopsy WNL, int hem Allen Norris, Darren, MD)   ESOPHAGOGASTRODUODENOSCOPY (EGD) WITH PROPOFOL N/A 05/22/2020   WNL ([place])   US ECHOCARDIOGRAPHY  2014   EF 50%, nl LV fxn, RV nl size/function, mild mitral insuff   Social History:  reports that he quit smoking about 3 years ago. His smoking use included cigarettes. He  has a 31.00 pack-year smoking history. He has never used smokeless tobacco. He reports current drug use. Drug: Marijuana. He reports that he does not drink alcohol.  Allergies  Allergen Reactions   Atorvastatin Other (See Comments)    Dizziness   Lisinopril Cough    Family History  Problem Relation Age of Onset   Heart failure Father 87   Cancer Mother 26       NHL   Hypertension Maternal Grandfather    CAD Maternal Grandfather 41       MI   Diabetes Other        grandparents   Cancer Brother 40       lung (smoker)    Prior to Admission medications   Medication Sig Start Date End Date Taking? Authorizing Provider  ACCU-CHEK FASTCLIX LANCETS MISC Check blood sugar once daily and as instructed. Dx 250.00 05/10/13   [name], MD  albuterol (PROVENTIL) (2.5 MG/3ML) 0.083% nebulizer solution INHALE 3 ML BY NEBULIZATION EVERY 6 HOURS AS NEEDED FOR WHEEZING OR SHORTNESS OF BREATH 11/24/21   [name], MD  albuterol (VENTOLIN HFA) 108 (90 Base) MCG/ACT inhaler INHALE 2 PUFFS BY MOUTH EVERY 6 HOURS AS NEEDED FOR WHEEZE OR SHORTNESS OF BREATH 09/02/21   [name], MD  amiodarone (PACERONE) 200 MG tablet Take 1 tablet (200 mg total) by mouth daily. 09/16/21   Vickie Epley, MD  Blood Glucose Monitoring Suppl (BLOOD GLUCOSE MONITOR SYSTEM) W/DEVICE KIT by Does not apply route. Use to check sugar once daily and as needed Dx: E11.9 **ONE TOUCH VERIO**    [provider]  Budeson-Glycopyrrol-Formoterol (BREZTRI AEROSPHERE) 160-9-4.8 MCG/ACT AERO Inhale 2 puffs into the lungs 2 (two) times daily. 10/23/21   [name], MD  carvedilol (COREG) 6.25 MG tablet TAKE 1 TABLET BY MOUTH 2 TIMES DAILY WITH A MEAL. 10/30/21   [name], MD  Cholecalciferol (VITAMIN D) 50 MCG (2000 UT) CAPS Take 1 capsule (2,000 Units total) by mouth daily. Patient taking differently: Take 2,000 Units by mouth daily. 02/05/19   [name], MD  citalopram (CELEXA) 10 MG  tablet Take 1 tablet (10 mg total) by mouth daily. 07/20/21   [name], MD  dapagliflozin propanediol (FARXIGA) 5 MG TABS tablet Take 1 tablet (5 mg total) by mouth daily before breakfast. 11/30/21   [name], MD  Docusate Calcium (STOOL SOFTENER PO) Take 1 tablet by mouth daily.    [provider]  fluticasone (FLONASE) 50 MCG/ACT nasal spray SPRAY 2 SPRAYS INTO EACH NOSTRIL EVERY DAY Patient taking differently: Place 1 spray into both nostrils daily as needed for allergies. 08/19/20   [name], MD  glimepiride (AMARYL) 4 MG tablet TAKE 1 TABLET BY MOUTH EVERY DAY WITH BREAKFAST  11/20/21   [name], MD  glucose blood [facility] VERIO) test strip Check blood sugar 3 times a day 10/22/21   [name], MD  hydrOXYzine (ATARAX) 50 MG tablet TAKE 1 TABLET (50 MG TOTAL) BY MOUTH 2 (TWO) TIMES DAILY AS NEEDED FOR ANXIETY. 05/05/21   [name], MD  ipratropium (ATROVENT HFA) 17 MCG/ACT inhaler Inhale 2 puffs into the lungs every 6 (six) hours as needed for wheezing. 09/02/20   [name], MD  metFORMIN (GLUCOPHAGE) 1000 MG tablet TAKE 1 TABLET (1,000 MG TOTAL) BY MOUTH 2 (TWO) TIMES DAILY WITH A MEAL. 10/26/21   [name], MD  metolazone (ZAROXOLYN) 2.5 MG tablet Take 1 tablet (2.5 mg total) by mouth 2 (two) times a week. 09/10/21   [name], MD  montelukast (SINGULAIR) 10 MG tablet TAKE 1 TABLET BY MOUTH EVERYDAY AT BEDTIME 07/07/21   [name], MD  NON FORMULARY 1,200 mg daily. SEA MOSS    [provider]  NON FORMULARY daily. SUPER BEETS    [provider]  NON FORMULARY 500 mg once. TURMERIC    [provider]  pantoprazole (PROTONIX) 40 MG tablet TAKE 1 TABLET (40 MG TOTAL) BY MOUTH 2 (TWO) TIMES DAILY BEFORE A MEAL. 07/29/21   [name], MD  Probiotic Product (PROBIOTIC DAILY PO) Take 1 tablet by mouth daily.    [provider]  simvastatin (ZOCOR) 20 MG tablet TAKE 1 TABLET BY  MOUTH EVERY DAY IN THE EVENING 09/28/21   [name], MD  torsemide (DEMADEX) 10 MG tablet Take 1 tablet (10 mg total) by mouth daily. 12/23/21   [name], MD  triamcinolone cream (KENALOG) 0.1 % Apply 1 application. topically 2 (two) times daily. Apply to AA. Patient taking differently: Apply 1 application  topically 3 (three) times daily. Apply to [place]. 07/20/21 07/20/22  [name], MD  vitamin B-12 (CYANOCOBALAMIN) 1000 MCG tablet Take 1 tablet (1,000 mcg total) by mouth every Monday, Wednesday, and Friday. Patient taking differently: Take 1,000 mcg by mouth daily. 07/22/21   [name], MD  warfarin (COUMADIN) 5 MG tablet TAKE 1/2 TABLET BY MOUTH DAILY EXCEPT TAKE NOTHING ON [facility] OR AS DIRECTED BY ANTICOAGULATION CLINIC 11/23/21   [name], MD    Physical Exam: Vitals:   12/28/21 1210 12/28/21 1300 12/28/21 1330 12/28/21 1400  BP: 115/84 137/78 (!) 168/90 (!) 146/90  Pulse: 66 63 75 66  Resp: _0 Temp:      TempSrc:      SpO2: 94% 97% 96% 100%  Weight:      Height:       Physical Exam Vitals and nursing note reviewed.  Constitutional:      Appearance: He is obese.  HENT:     Head: Normocephalic and atraumatic.  Eyes:     Pupils: Pupils are equal, round, and reactive to light.  Cardiovascular:     Rate and Rhythm: Normal rate and regular rhythm.     Heart sounds: Normal heart sounds.  Pulmonary:     Effort: Pulmonary effort is normal.  Abdominal:     General: Bowel sounds are normal.     Palpations: Abdomen is soft.     Comments: Central adiposity.  Umbilical hernia  Musculoskeletal:     Cervical back: Normal range of motion and neck supple.     Right lower leg: Edema present.     Left lower leg: Edema present.  Skin:    General: Skin is warm and  dry.  Neurological:     General: No focal deficit present.     Mental Status: He is alert.  Psychiatric:        Mood and Affect: Mood normal.        Behavior: Behavior normal.      Data Reviewed: Relevant notes from primary care and specialist visits, past discharge summaries as available in EHR, including Care Everywhere. Prior diagnostic testing as pertinent to current admission diagnoses Updated medications and problem lists for reconciliation ED course, including vitals, labs, imaging, treatment and response to treatment Triage notes, nursing and pharmacy notes and ED provider's notes Notable results as noted in HPI Labs reviewed.  Magnesium 1.9, sodium 137, potassium 3.6, chloride 88, bicarb 37, glucose 140, BUN 22, creatinine 0.94, calcium 9.1, PT 17.0, INR 1.4, white count 9.6, hemoglobin 10.9, hematocrit 39.1, platelet count 307 Chest x-ray reviewed by me shows Stable cardiomegaly with mild central pulmonary vascular congestion. Mild right basilar subsegmental atelectasis is noted. CT scan of the head and cervical spine shows No acute intracranial abnormality seen.Severe multilevel degenerative disc disease is noted in the cervical spine. No fracture is noted. CT scan of chest/abdomen/pelvis shows No acute findings are seen in CT scan of chest, abdomen and pelvis. There is no evidence of mediastinal or retroperitoneal hematoma. No displaced fractures are seen. Small linear patchy densities in the lower lung fields may suggest scarring and possibly atelectasis/pneumonia. There is possible minimal right pleural effusion. There is no pneumothorax.There is no demonstrable laceration in solid organs. There is no bowel wall thickening.Coronary artery disease. There is 1.6 cm left adrenal nodule. Follow-up multiphasic CT in 6 months may be considered.Small ascites. There is diffuse edema in subcutaneous plane suggesting anasarca. 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 12 EKG reviewed by me shows sinus rhythm with PVCs, short PR interval and incomplete right bundle branch There are no new results to review at this time.  Assessment and Plan: Respiratory  failure, unsp, unsp w hypoxia or hypercapnia ([place]) Patient with a history of respiratory failure secondary to COPD, obesity hypoventilation syndrome and obstructive sleep apnea Presents to the ER for evaluation following a fall at home and is found to have compensated respiratory acidosis. He was hypoxic with room air pulse oximetry of 74 and on 2 L his pulse oximetry improved to greater than 92%. He was ambulated in the ER and per documentation pulse oximetry dropped even on 2 L post ambulation Per patient he was recently started on home oxygen but has not been using it as recommended He is also supposed to be on a CPAP at home but has not been using it as well since it is broken. Patient states that he has called to be scheduled for sleep study and is waiting for an appointment. Discussed in detail with patient that based on his hypercapnia he will likely qualify for a trilogy. We will consult pulmonology in a.m. for further recommendations Continue oxygen supplementation to maintain pulse oximetry greater than 40%  Chronic diastolic CHF (congestive heart failure) (HCC) Stable Continue Lasix, metolazone and carvedilol  Recurrent falls Secondary to narcolepsy Supportive care  COPD (chronic obstructive pulmonary disease) (HCC) Continue bronchodilator therapy as needed Continue inhaled steroids  Type 2 diabetes mellitus with other specified complication (HCC) Blood sugars are stable Hold oral hypoglycemic agents Sliding scale coverage and Accu-Cheks before meals and at bedtime Maintain consistent carbohydrate diet  Obesity, morbid, BMI 40.0-49.9 (HCC) BMI 98.11 Complicates overall prognosis and care Lifestyle  modification and exercise has been discussed with patient in detail   History of atrial flutter Continue amiodarone and carvedilol for rate control Patient is on Coumadin as primary prophylaxis for an acute stroke INR is subtherapeutic Continue Coumadin Daily  PT/INR  Obstructive sleep apnea Secondary to morbid obesity with a BMI of 40.45 CPAP at bedtime      Advance Care Planning:   Code Status: Full Code   Consults: Pulmonology in a.m.  Family Communication: Greater than 50% of time was spent discussing patient's condition and plan of care with him at the bedside.  All questions and concerns have been addressed.  He verbalizes understanding and agrees with the plan  Severity of Illness: The appropriate patient status for this patient is INPATIENT. Inpatient status is judged to be reasonable and necessary in order to provide the required intensity of service to ensure the patient's safety. The patient's presenting symptoms, physical exam findings, and initial radiographic and laboratory data in the context of their chronic comorbidities is felt to place them at high risk for further clinical deterioration. Furthermore, it is not anticipated that the patient will be medically stable for discharge from the hospital within 2 midnights of admission.   * I certify that at the point of admission it is my clinical judgment that the patient will require inpatient hospital care spanning beyond 2 midnights from the point of admission due to high intensity of service, high risk for further deterioration and high frequency of surveillance required.*  Author: Collier Bullock, MD 12/28/2021 2:40 PM  For on call review [email or web address].

## 2021-12-28 NOTE — ED Notes (Signed)
Pt ambulated per Dr Charna Archer. 97-100% on 2 L [place] and on RA while ambulated. After ambulation, pt desat to 74% while sitting, placed back on 4 L [place] to improve.

## 2021-12-28 NOTE — Assessment & Plan Note (Signed)
Continue bronchodilator therapy as needed Continue inhaled steroids

## 2021-12-31 ENCOUNTER — Ambulatory Visit (INDEPENDENT_AMBULATORY_CARE_PROVIDER_SITE_OTHER): Payer: Medicare Other

## 2021-12-31 ENCOUNTER — Telehealth: Payer: Self-pay | Admitting: Pharmacist

## 2021-12-31 ENCOUNTER — Ambulatory Visit (INDEPENDENT_AMBULATORY_CARE_PROVIDER_SITE_OTHER): Payer: Medicare Other | Admitting: Pharmacist

## 2021-12-31 DIAGNOSIS — J449 Chronic obstructive pulmonary disease, unspecified: Secondary | ICD-10-CM

## 2021-12-31 DIAGNOSIS — Z7901 Long term (current) use of anticoagulants: Secondary | ICD-10-CM

## 2021-12-31 DIAGNOSIS — E1169 Type 2 diabetes mellitus with other specified complication: Secondary | ICD-10-CM

## 2021-12-31 DIAGNOSIS — I4819 Other persistent atrial fibrillation: Secondary | ICD-10-CM

## 2021-12-31 DIAGNOSIS — G4733 Obstructive sleep apnea (adult) (pediatric): Secondary | ICD-10-CM

## 2021-12-31 DIAGNOSIS — I5022 Chronic systolic (congestive) heart failure: Secondary | ICD-10-CM

## 2021-12-31 LAB — POCT INR: INR: 1.4 — AB (ref 2.0–3.0)

## 2021-12-31 NOTE — Progress Notes (Addendum)
ER visit for chest wall pain on 9/4 (fall). Pt reports he was taking 1/2 tablet daily except taking nothing on Saturday and Sunday. He was supposed to be taking 1/2 tablet daily except take nothing on Saturdays. So, no change in weekly dosing was made. Pt will boost dose today and return to ordered weekly dose from last visit.   Increase dose today to take 1 tablet and then continue 1/2 tablet daily except take nothing on Sundays. Recheck in 1 week.

## 2021-12-31 NOTE — Telephone Encounter (Signed)
  Chronic Care Management   Outreach Note  12/31/2021 Name: [name] MRN: [id number] DOB: Apr 12, 1956  Referred by: [name], MD  Patient had a phone appointment scheduled with clinical pharmacist today.  An unsuccessful telephone outreach was attempted today. The patient was referred to the pharmacist for assistance with medications, care management and care coordination.   Patient will NOT be penalized in any way for missing a CCM appointment. The no-show fee does not apply.  If possible, a message was left to return call to: [phone] or to [facility].  [name], PharmD, BCACP Clinical Pharmacist [facility] [phone]

## 2021-12-31 NOTE — Patient Instructions (Addendum)
Pre visit review using our clinic review tool, if applicable. No additional management support is needed unless otherwise documented below in the visit note.  Increase dose today to take 1 tablet and then continue 1/2 tablet daily except take nothing on Sundays. Recheck in 1 week.

## 2021-12-31 NOTE — Progress Notes (Signed)
Chronic Care Management Pharmacy Note  01/01/2022 Name:  [name] MRN:  [id number] DOB:  1955/05/02  Summary: CCM F/U visit -Reviewed medications; pt affirms compliance -HF: reviewed recent switch from furosemide to torsemide, pt did not realize torsemide replaces furosemide -DM: A1c 8.7% (10/2021); pt reports he has focused on improving diet and fasting BG is now in 150-210 range; he could not afford Wilder Glade previously and has not received this yet despite enrollment in [employer] -COPD: Pt received Breztri from [employer] and has been using it as prescribed, he reports it works as well as [place]: unable to tolerate CPAP previously but pt has not tried new masks in a few years; he would like to revisit this and other options  Recommendations/Changes made from today's visit: -Pharmacy team will check on Farxiga shipment from [facility] -Reminded pt to call pulmonology for appt - sleep study, CPAP   Plan: -[place] will call [facility] re: Farxiga shipment -Pharmacist follow up televisit scheduled for 1 months -PCP F/U 11/02/21    Subjective: [name] is an 66 y.o. year old male who is a primary patient of [name], MD.  The CCM team was consulted for assistance with disease management and care coordination needs.    Engaged with patient by telephone for follow up visit in response to provider referral for pharmacy case management and/or care coordination services.   Consent to Services:  The patient was given information about Chronic Care Management services, agreed to services, and gave verbal consent prior to initiation of services.  Please see initial visit note for detailed documentation.   Patient Care Team: [name], MD as PCP - General (Family Medicine) Vickie Epley, MD as PCP - Electrophysiology (Cardiology) Thompson Grayer, MD as Attending Physician (Cardiology) Corey Skains, MD as Referring Physician (Internal  Medicine) Charlton Haws, [facility] as Pharmacist (Pharmacist)  Recent office visits: 12/23/21 Dr Danise Mina OV: mass; has not scheduled w/ pulm, given # to call. Changed furosemide to torsemide 10 mg d/t overall poor diuresis. Update 1-2 weeks.  12/11/21 Dr Danise Mina OV: SOB - referred to pulmonology. Given # for home health and [employer] [facility]. Discussed ER eval for IV diuresis, pt declined. Stat request for 2L O2.   11/20/21 Dr Danise Mina OV: hospital f/u - referred to pulmonology for OSA, CPAP.  09/17/21 Telephone:  Start: Warfarin 5 mg daily - establish with [facility] for coumadin clinic. 09/08/21 [name], MD OV: Panic Attacks (worse d/t running out of metolazone and associated dypsnea); Increase furosemide to 80 mg BID. Restart Metolazone 2.5 mg twice weekly. Limit hydroxyzine 50 mg to 1 at a time. Avoid Benzo given h/o MJ use and COPD. Refer to [facility] for IDA (endoscopy).  08/26/21 [name], MD OV:  Abdominal Pain Abnormal Labs: "Please notify kidneys returned normal, liver also normal.  Sugar was high and carbon dioxide levels remain elevated in the blood.  His blood counts show progressive anemia hopefully his recent iron infusion will help with this"  Concerned for bowel obstruction. DG Xray - WNL. 08/17/21 [name], MD OV: Leg Ulcer (wound check - chronic venous insufficiency)- Stop (completed) Cyclobenzaprine 10 mg. No other changes. FU 2 months 08/10/21 [name], MD OV: Leg Ulcer (wound check): Persistent edema. Increase metolazone to 2.5 mg twice a week. Refer to heme/onc for IDA. Refer to wound clinic. 08/04/21 [name], MD OV: f/u - Apply for Farxiga PAP.  Recent consult visits: 10/16/21 Dr Janese Banks (heme/onco): f/u IDA.  Iron still low s/p 2 doses of ferahme. Ordered 2 more doses of feraheme. Consider capsule endoscopy. Repeat CBC/iron 3-6 mos.  10/08/21 Radiology - Anemia procedure - no other information 10/05/21 Capsule Endoscopy  09/29/21 NP Denice Paradise (GI): initial consult - IDA. EGD/colonoscopy ruled out. Ddx to include small bowel tumors or AVM. Rec capsule endoscopy.   09/16/21 Lars Mage, MD (Cardiology): Afib EKG and Echo Decrease: Amiodarone 200 mg daily vs BID; Stop Eliquis (cost); Start Warfarin - pt requests PCP monitoring.  09/02/2021 Iron Infusion Administered: FERUMOXYTOL Shirlean Kelly) Q7D  08/31/21 CT Abdomen Pelvis 08/26/2021 Iron Infusion Administered: FERUMOXYTOL Shirlean Kelly) Q7D   08/14/21 Randa Evens, MD (Oncology) Anemia Stop (completed): Cephalexin 500 mg. Recommend 2 doses of Feraheme 510 mg IV weekly. Referral lung cancer screening  07/29/21 Jettie Booze, PA (Cardiology) Afib Start: Amiodarone 200 mg. Proceed to electrical cardioversion of Afib. Referral to electrophysiology.   05/12/2021 - Cardiology - Patient presented for Atrial Fibrillation and Flutter. Procedure: EKG. Discontinued (no reason given): Albuterol 2.5 mg, Isosorbide Mononitrate 20 mg ER tablet. Change: Cyanocobalamin 1000 mcg tablet vs. 1000 mcg SL tablet.   Hospital visits: 12/28/21 ED visit [facility]): chest wall pain, multiple falls, CHF exacerbation. CT head negative. IV lasix given.  08/12/21 Admission for Cardioversion   Objective:  Lab Results  Component Value Date   CREATININE 0.94 12/28/2021   BUN 22 12/28/2021   GFR 94.25 11/20/2021   GFRNONAA >60 12/28/2021   GFRAA >60 12/22/2018   NA 137 12/28/2021   K 3.6 12/28/2021   CALCIUM 9.1 12/28/2021   CO2 37 (H) 12/28/2021   GLUCOSE 140 (H) 12/28/2021    Lab Results  Component Value Date/Time   HGBA1C 8.7 (H) 11/23/2021 01:59 PM   HGBA1C 10.4 (H) 07/15/2021 09:12 AM   HGBA1C 7.0 (H) 01/24/2013 04:07 AM   GFR 94.25 11/20/2021 12:29 PM   GFR 93.29 08/26/2021 01:00 PM   MICROALBUR 5.4 (H) 07/15/2021 09:12 AM   MICROALBUR <0.7 07/09/2020 08:22 AM    Last diabetic Eye exam:  Lab Results  Component Value Date/Time   HMDIABEYEEXA No Retinopathy 06/17/2020 12:00 AM    Last  diabetic Foot exam: No results found for: "HMDIABFOOTEX"   Lab Results  Component Value Date   CHOL 125 07/15/2021   HDL 36.50 (L) 07/15/2021   LDLCALC 60 07/15/2021   LDLDIRECT 97.0 07/09/2020   TRIG 143.0 07/15/2021   CHOLHDL 3 07/15/2021       Latest Ref Rng & Units 08/26/2021    1:00 PM 08/04/2021    3:00 PM 07/15/2021    9:12 AM  Hepatic Function  Total Protein 6.0 - 8.3 g/dL 6.5   6.5   Albumin 3.5 - 5.2 g/dL 4.2  4.3  4.4   AST 0 - 37 U/L 15   21   ALT 0 - 53 U/L 11   17   Alk Phosphatase 39 - 117 U/L 54   62   Total Bilirubin 0.2 - 1.2 mg/dL 0.5   0.5   Bilirubin, Direct 0.0 - 0.3 mg/dL 0.1       Lab Results  Component Value Date/Time   TSH 2.81 07/15/2021 09:12 AM   TSH 2.63 10/02/2019 12:05 PM       Latest Ref Rng & Units 12/28/2021   10:29 AM 11/20/2021   12:29 PM 10/16/2021    9:08 AM  CBC  WBC 4.0 - 10.5 K/uL 9.6  10.4  10.7   Hemoglobin 13.0 - 17.0 g/dL 10.9  11.8  10.1   Hematocrit 39.0 - 52.0 % 39.1  39.6  35.9   Platelets 150 - 400 K/uL 307  249.0  192    Iron/TIBC/Ferritin/ %Sat    Component Value Date/Time   IRON 22 (L) 10/16/2021 0908   TIBC 447 10/16/2021 0908   FERRITIN 10 (L) 10/16/2021 0908   IRONPCTSAT 5 (L) 10/16/2021 0908   Lab Results  Component Value Date/Time   INR 1.4 (A) 12/31/2021 12:00 AM   INR 1.4 (H) 12/28/2021 10:29 AM   INR 2.0 12/17/2021 12:00 AM   INR 12.7 Repeated and verified X2. ([place]) 12/03/2021 02:04 PM   INR 1.8 08/27/2011 12:12 PM    Lab Results  Component Value Date/Time   VD25OH 44.82 07/15/2021 09:12 AM   VD25OH 29.46 (L) 07/09/2020 07:57 AM    Clinical ASCVD: Yes  The ASCVD Risk score (Arnett DK, et al., 2019) failed to calculate for the following reasons:   The valid total cholesterol range is 130 to 320 mg/dL       09/24/2021    2:08 PM 09/08/2021   10:45 AM 07/20/2021    4:50 PM  Depression screen PHQ 2/9  Decreased Interest 0 2 2  Down, Depressed, Hopeless 0 1 1  PHQ - 2 Score 0 3 3  Altered  sleeping  3 3  Tired, decreased energy  3 3  Change in appetite  0 3  Feeling bad or failure about yourself   0 2  Trouble concentrating  0 0  Moving slowly or fidgety/restless  0 0  Suicidal thoughts  0 0  PHQ-9 Score  9 14  Difficult doing work/chores  Very difficult     CHA2DS2/VAS Stroke Risk Points  Current as of yesterday     5 >= 2 Points: High Risk  1 - 1.99 Points: Medium Risk  0 Points: Low Risk    No Change      Points Metrics  1 Has Congestive Heart Failure:  Yes    Current as of yesterday  1 Has Vascular Disease:  Yes    Current as of yesterday  1 Has Hypertension:  Yes    Current as of yesterday  1 Age:  74    Current as of yesterday  1 Has Diabetes:  Yes    Current as of yesterday  0 Had Stroke:  No  Had TIA:  No  Had Thromboembolism:  No    Current as of yesterday  0 Male:  No    Current as of yesterday     Social History   Tobacco Use  Smoking Status Former   Packs/day: 1.00   Years: 31.00   Total pack years: 31.00   Types: Cigarettes   Quit date: 04/26/2018   Years since quitting: 3.6  Smokeless Tobacco Never   BP Readings from Last 3 Encounters:  12/28/21 (!) 146/90  12/23/21 132/66  12/11/21 126/66   Pulse Readings from Last 3 Encounters:  12/28/21 66  12/23/21 69  12/11/21 85   Wt Readings from Last 3 Encounters:  12/28/21 290 lb (131.5 kg)  12/23/21 (!) 311 lb (141.1 kg)  12/11/21 (!) 309 lb 8 oz (140.4 kg)   BMI Readings from Last 3 Encounters:  12/28/21 40.45 kg/m  12/23/21 43.38 kg/m  12/11/21 43.17 kg/m    Assessment/Interventions: Review of patient past medical history, allergies, medications, health status, including review of consultants reports, laboratory and other test data, was performed as part of comprehensive evaluation  and provision of chronic care management services.   SDOH:  (Social Determinants of Health) assessments and interventions performed: Yes SDOH Interventions    Flowsheet Row Chronic Care  Management from 10/20/2021 in [place] at [facility] Visit from 07/20/2021 in [place] at [place] from 07/09/2020 in [place] at [place]  SDOH Interventions     Depression Interventions/Treatment  -- Medication PHQ2-9 Score <4 Follow-up Not Indicated  Financial Strain Interventions Other (Comment)  [[employer] PAP] -- --      SDOH Screenings   Food Insecurity: No Food Insecurity (09/24/2021)  Housing: Low Risk  (07/09/2020)  Transportation Needs: No Transportation Needs (09/24/2021)  Alcohol Screen: Low Risk  (07/09/2020)  Depression (PHQ2-9): Low Risk  (09/24/2021)  Recent Concern: Depression (PHQ2-9) - Medium Risk (09/08/2021)  Financial Resource Strain: Medium Risk (10/23/2021)  Physical Activity: Inactive (09/24/2021)  Stress: No Stress Concern Present (09/24/2021)  Tobacco Use: Medium Risk (12/28/2021)    CCM Care Plan  Allergies  Allergen Reactions   Atorvastatin Other (See Comments)    Dizziness   Lisinopril Cough    Medications Reviewed Today     Reviewed by [name], MD (Physician) on 12/23/21 at [place] List Status: <None>   Medication Order Taking? Sig Documenting Provider Last Dose Status Informant  ACCU-CHEK FASTCLIX LANCETS [place] [id number] Yes Check blood sugar once daily and as instructed. Dx 250.00 [name], MD Taking Active Self  albuterol (PROVENTIL) (2.5 MG/3ML) 0.083% nebulizer solution [id number] Yes INHALE 3 ML BY NEBULIZATION EVERY 6 HOURS AS NEEDED FOR WHEEZING OR SHORTNESS OF [name] Morin, MD Taking Active   albuterol (VENTOLIN HFA) 108 (90 Base) MCG/ACT inhaler [id number] Yes INHALE 2 PUFFS BY MOUTH EVERY 6 HOURS AS NEEDED FOR WHEEZE OR SHORTNESS OF [name] Morin, MD Taking Active   amiodarone (PACERONE) 200 MG tablet [id number] Yes Take 1 tablet (200 mg total) by mouth daily. Vickie Epley, MD Taking Active   Blood Glucose Monitoring Suppl (BLOOD GLUCOSE MONITOR  SYSTEM) W/DEVICE KIT [id number] Yes by Does not apply route. Use to check sugar once daily and as needed Dx: E11.9 **ONE TOUCH VERIO** [provider] Taking Active Self  Budeson-Glycopyrrol-Formoterol (BREZTRI AEROSPHERE) 160-9-4.8 MCG/ACT AERO [id number] Yes Inhale 2 puffs into the lungs 2 (two) times daily. [name], MD Taking Active   carvedilol (COREG) 6.25 MG tablet [id number] Yes TAKE 1 TABLET BY MOUTH 2 TIMES DAILY WITH A MEAL. [name], MD Taking Active   Cholecalciferol (VITAMIN D) 50 MCG (2000 UT) CAPS [id number] Yes Take 1 capsule (2,000 Units total) by mouth daily.  Patient taking differently: Take 2,000 Units by mouth daily.   [name], MD Taking Active Self  citalopram (CELEXA) 10 MG tablet [id number] Yes Take 1 tablet (10 mg total) by mouth daily. [name], MD Taking Active Self  dapagliflozin propanediol (FARXIGA) 5 MG TABS tablet [id number] Yes Take 1 tablet (5 mg total) by mouth daily before breakfast. [name], MD Taking Active   Docusate Calcium (STOOL SOFTENER PO) [id number] Yes Take 1 tablet by mouth daily. [provider] Taking Active Self  ferumoxytol (FERAHEME) 510 mg in sodium chloride 0.9 % 100 mL IVPB [id number]   Sindy Guadeloupe, MD  Active   fluticasone (FLONASE) 50 MCG/ACT nasal spray [id number] Yes SPRAY 2 SPRAYS INTO EACH NOSTRIL EVERY DAY  Patient taking differently: Place 1 spray into both nostrils daily as needed for allergies.   [name], MD Taking Active Self  furosemide (  LASIX) 80 MG tablet [id number] Yes Take 1 tablet (80 mg total) by mouth 2 (two) times daily. [name], MD Taking Active   glimepiride (AMARYL) 4 MG tablet [id number] Yes TAKE 1 TABLET BY MOUTH EVERY DAY WITH Sander Radon, MD Taking Active   glucose blood [facility] VERIO) test strip [id number] Yes Check blood sugar 3 times a day [name], MD Taking Active   hydrOXYzine (ATARAX) 50 MG tablet [id number] Yes  TAKE 1 TABLET (50 MG TOTAL) BY MOUTH 2 (TWO) TIMES DAILY AS NEEDED FOR ANXIETY. [name], MD Taking Active Self  ipratropium (ATROVENT HFA) 17 MCG/ACT inhaler [id number] Yes Inhale 2 puffs into the lungs every 6 (six) hours as needed for wheezing. [name], MD Taking Active Self  metFORMIN (GLUCOPHAGE) 1000 MG tablet [id number] Yes TAKE 1 TABLET (1,000 MG TOTAL) BY MOUTH 2 (TWO) TIMES DAILY WITH A MEAL. [name], MD Taking Active   metolazone (ZAROXOLYN) 2.5 MG tablet [id number] Yes Take 1 tablet (2.5 mg total) by mouth 2 (two) times a week. [name], MD Taking Active   montelukast (SINGULAIR) 10 MG tablet [id number] Yes TAKE 1 TABLET BY MOUTH EVERYDAY AT BEDTIME [name], MD Taking Active Self  NON FORMULARY [id number] Yes 1,200 mg daily. SEA MOSS [provider] Taking Active   NON FORMULARY [id number] Yes daily. SUPER BEETS [provider] Taking Active   NON FORMULARY [id number] Yes 500 mg once. TURMERIC [provider] Taking Active   pantoprazole (PROTONIX) 40 MG tablet [id number] Yes TAKE 1 TABLET (40 MG TOTAL) BY MOUTH 2 (TWO) TIMES DAILY BEFORE A MEAL. [name], MD Taking Active Self  Probiotic Product (PROBIOTIC DAILY PO) [id number] Yes Take 1 tablet by mouth daily. [provider] Taking Active Self  simvastatin (ZOCOR) 20 MG tablet [id number] Yes TAKE 1 TABLET BY MOUTH EVERY DAY IN THE Recardo Evangelist, MD Taking Active   triamcinolone cream (KENALOG) 0.1 % [id number] Yes Apply 1 application. topically 2 (two) times daily. Apply to AA.  Patient taking differently: Apply 1 application  topically 3 (three) times daily. Apply to AA.   [name], MD Taking Active Self  vitamin B-12 (CYANOCOBALAMIN) 1000 MCG tablet [id number] Yes Take 1 tablet (1,000 mcg total) by mouth every Monday, Wednesday, and Friday.  Patient taking differently: Take 1,000 mcg by mouth daily.   [name], MD Taking  Active Self  warfarin (COUMADIN) 5 MG tablet [id number] Yes TAKE 1/2 TABLET BY MOUTH DAILY EXCEPT TAKE NOTHING ON WEDNESDAYS OR AS DIRECTED BY ANTICOAGULATION CLINIC [name], MD Taking Active   Med List Note Chauncey Fischer, RN 01/05/18 1237): UDS 01/05/18            Patient Active Problem List   Diagnosis Date Noted   Hypoxia 12/28/2021   Chronic diastolic CHF (congestive heart failure) ([place]) 12/28/2021   Respiratory failure, unsp, unsp w hypoxia or hypercapnia ([place]) 12/28/2021   Acute respiratory failure with hypoxia and hypercapnia ([place]) 12/11/2021   Left arm pain 11/21/2021   Recurrent falls 11/20/2021   Leg ulcer, right, limited to breakdown of skin ([place]) 08/06/2021   Hypercapnia 08/06/2021   Abdominal pain 07/21/2021   Atrial fibrillation ([place]) 07/15/2021   Vitreous floaters of left eye 06/05/2020   PLMD (periodic limb movement disorder) 06/05/2020   Ulceration of intestine    Pedal edema 02/25/2020   Neck pain on left side 02/25/2020   Mediastinal mass 01/05/2020   Skin rash 10/12/2019   Vitamin D deficiency 04/19/2019  Low serum vitamin B12 04/19/2019   Iron deficiency anemia 01/02/2019   Epigastric abdominal pain 09/19/2018   COPD (chronic obstructive pulmonary disease) (HCC) 07/25/2018   Chronic neck pain [facility] Area of Pain) (R>L) 01/05/2018   Chronic upper extremity pain ([place] Area of Pain) (R>L) 01/05/2018   Chronic upper back pain 01/05/2018   Chronic bilateral low back pain without sciatica 01/05/2018   Chronic pain syndrome 01/05/2018   Right temporal headache 10/17/2017   Right leg weakness 07/14/2017   AAA (abdominal aortic aneurysm) without rupture ([place]) 09/15/2016   Adrenal adenoma, left 08/13/2016   Nodule of right lung 08/13/2016   Fatty liver 08/01/2016   Right shoulder pain 07/26/2016   Restrictive lung disease 58/12/9831   Chronic systolic CHF (congestive heart failure) ([place])    Tinea pedis 06/30/2015   Gassiness 02/21/2015    Advanced care planning/counseling discussion 10/16/2014   Encounter for general adult medical examination with abnormal findings 10/16/2014   Type 2 diabetes mellitus with other specified complication ([place]) 82/50/5397   Medicare annual wellness visit, subsequent 67/34/1937   Umbilical hernia without obstruction and without gangrene 09/03/2013   Left flank mass 09/03/2013   Hyperlipidemia associated with type 2 diabetes mellitus ([place]) 06/29/2013   Coronary artery disease 06/29/2013   Cardiomyopathy, dilated ([place]) 06/29/2013   Abnormal toxicological findings 06/24/2013   Exertional dyspnea 06/14/2013   Malaise and fatigue 03/14/2013   Hypertension    Panic attacks    History of atrial flutter 07/07/2011   Obesity, morbid, BMI 40.0-49.9 ([place]) 07/07/2011   Ex-smoker 08/13/2009   Obstructive sleep apnea 08/13/2009   DDD (degenerative disc disease), cervical 08/13/2009   Allergic rhinitis 06/24/2009    Immunization History  Administered Date(s) Administered   Influenza, High Dose Seasonal PF 03/26/2021   Influenza,inj,Quad PF,6+ Mos 05/29/2014, 02/21/2015, 02/22/2017, 03/16/2018, 01/02/2019, 01/04/2020   Influenza-Unspecified 01/24/2013   PFIZER(Purple Top)SARS-COV-2 Vaccination 07/12/2019, 08/02/2019, 03/19/2020   Pfizer Covid-19 Vaccine Bivalent Booster 89yr & up 03/26/2021   Pneumococcal Polysaccharide-23 02/24/2013   Td 04/26/2006   Tdap 11/10/2021    Conditions to be addressed/monitored:  Hypertension, Hyperlipidemia, Diabetes, Atrial Fibrillation, Heart Failure, Coronary Artery Disease, and COPD  Care Plan : [place] Updates made by FCharlton Haws [place] 01/01/2022 12:00 AM     Problem: Hypertension, Hyperlipidemia, Diabetes, Atrial Fibrillation, Heart Failure, Coronary Artery Disease, and COPD   Priority: High     Long-Range Goal: Disease mgmt   Start Date: 10/23/2021  Expected End Date: 10/24/2022  This Visit's Progress: On track  Recent  Progress: On track  Priority: High  Note:   Current Barriers:  Unable to independently afford treatment regimen Unable to maintain control of diabetes  Pharmacist Clinical Goal(s):  Patient will verbalize ability to afford treatment regimen adhere to plan to optimize therapeutic regimen for diabetes as evidenced by report of adherence to recommended medication management changes through collaboration with PharmD and provider.   Interventions: 1:1 collaboration with GRia Bush MD regarding development and update of comprehensive plan of care as evidenced by provider attestation and co-signature Inter-disciplinary care team collaboration (see longitudinal plan of care) Comprehensive medication review performed; medication list updated in electronic medical record  Hypertension / Heart Failure (BP goal <130/80) -Query controlled - pt has not started new torsemide yet, he did not realize it was meant to replace furosemide -Last ejection fraction: >55% (Date: 02/2020) -HF type: Diastolic; NYHA Class: II/III -Follows with cardiology (Dr KNehemiah Massed -Current treatment: Carvedilol 6.25 mg BID - Appropriate,  Effective, Safe, Accessible Torsemide 10 mg daily - Appropriate, Query Effective (not started yet) Metolazone 2.5 mg twice weekly -Appropriate, Effective, Safe, Accessible -Medications previously tried: losartan, metoprolol, furosemide -Educated on BP goals and benefits of medications for prevention of heart attack, stroke and kidney damage; -Counseled to monitor BP at home daily -Recommended to continue current medication  Atrial Fibrillation (Goal: prevent stroke and major bleeding) -Controlled - pt recently switched from Eliquis to warfarin due to cost issues; he has been following closely with PCP warfarin clinic and INR has been fluctuating, previously >12 8/10 requiring vitamin K, now < 2 -Cardioversion 07/2021 -CHADSVASC: 5 -Follows with cardiology (Dr Nehemiah Massed) -Current  treatment: Carvedilol 6.25 mg BID - Appropriate, Effective, Safe, Accessible Amiodarone 200 mg daily -Appropriate, Effective, Safe, Accessible Warfarin 5 mg - 1/2 tab daily except 1 tab M/Th -Appropriate, Query Effective (INR < 2) -Medications previously tried: Eliquis (cost) -Counseled on increased risk of stroke due to Afib and benefits of anticoagulation for stroke prevention; importance of adherence to anticoagulant exactly as prescribed; bleeding risk associated with warfarin and importance of self-monitoring for signs/symptoms of bleeding; avoidance of NSAIDs due to increased bleeding risk with anticoagulants; importance of regular laboratory monitoring; -Recommended to continue current medication  Hyperlipidemia / CAD (LDL goal < 70) -Controlled - LDL 60 (06/2021) at goal -Hx CAD (3v disease on CT scan); also AAA; no aspirin due to warfarin -Current treatment: Simvastatin 20 mg daily HS - Appropriate, Effective, Safe, Accessible -Medications previously tried: n/a  -Educated on Cholesterol goals; Benefits of statin for ASCVD risk reduction; -Recommended to continue current medication  Diabetes (A1c goal <8%) -Uncontrolled, improving - A1c 8.7% (10/2021); he has started watching his diet and home BG appears improved; PCP would like him to be on Farxiga, he has not started this yet as [employer] has not shipped it yet -Current home glucose readings fasting glucose: 156 - 210 -Denies hypoglycemic/hyperglycemic symptoms -Pt does not sleep well, breakfast is sometimes 2pm -Current medications: Metformin 1000 mg BID - Appropriate, Query Effective Glimepiride 4 mg daily -Appropriate, Query Effective Farxiga 5 mg daily - not started yet [employer] supplies -Medications previously tried: [place] (cost), Januvia  -Educated on A1c and blood sugar goals; Complications of diabetes including kidney damage, retinal damage, and cardiovascular disease; Benefits of routine self-monitoring of blood  sugar; -Recommended to continue current medication; will coordinate with [employer] to ship [place]  COPD (Goal: control symptoms and prevent exacerbations) -Controlled - 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; he has 2 inhalers remaining from [employer] -Gold Grade: Gold 3 (FEV1 30-49%) -Current COPD Classification:  D (high sx, >/=2 exacerbations/yr) -Pulmonary function testing: Spirometry 05/2018: pre: FVC 54%, FEV1 51%, ratio 0.71 post: FVC 54%, FEV1 49%, ratio 0.69 -Exacerbations requiring treatment in last 6 months: 0 (last 07/2020) -Current treatment  Breztri 160-9-4.8 mcg/act 2 puff BID (PAP)- Appropriate, Effective, Safe, Accessible Atrovent (ipratropium) 2 puffs PRN - Appropriate, Effective, Safe, Accessible Albuterol HFA -Appropriate, Effective, Safe, Accessible Albuterol neb -Appropriate, Effective, Safe, Accessible Montelukast 10 mg daily HS -Appropriate, Effective, Safe, Accessible Flonase nasal spray -Appropriate, Effective, Safe, Accessible -Medications previously tried: Trelegy, Advair, Spiriva -Patient reports consistent use of maintenance inhaler -Counseled on Proper inhaler technique; Benefits of consistent maintenance inhaler use -Recommend to continue current medication  Insomnia (Goal: manage symptoms) -Uncontrolled  -Hx OSA on CPAP; he is noncompliant with CPAP due to uncomfortable mask, he has not tried new mask in a few years; he has been referred  to pulmonology but has not made appt yet; pt provided with phone number for pulm office and advised to call for appt  Patient Goals/Self-Care Activities Patient will:  - take medications as prescribed as evidenced by patient report and record review focus on medication adherence by routine check glucose daily, document, and provide at future appointments check blood pressure daily, document, and provide at future appointments       Medication Assistance:  [employer] - Breztri approved.  Wilder Glade pending A1c < 10%  Compliance/Adherence/Medication fill history: Care Gaps: Foot, eye exams  Star-Rating Drugs: Glimepiride - PDC 96% Metformin - PDC 100%  Medication Access: Within the past 30 days, how often has patient missed a dose of medication? 0 Is a pillbox or other method used to improve adherence? Yes  Factors that may affect medication adherence? financial need Are meds synced by current pharmacy? No  Are meds delivered by current pharmacy? No  Does patient experience delays in picking up medications due to transportation concerns? No   [facility] Reviewed: Is patient disadvantaged to use [facility]?: No  Current Rx insurance plan: [facility] Name and location of Current pharmacy:  [id number] [place] [id number]: [phone]: [phone] [facility] services reviewed with patient today?: No  Patient requests to transfer care to [facility]?: No  Reason patient declined to change pharmacies: Not mentioned at this visit   Care Plan and Follow Up Patient Decision:  Patient agrees to Care Plan and Follow-up.  Plan: Telephone follow up appointment with care management team member scheduled for:  1 month  LCharlene Brooke PharmD, [facility] Cul[facility] CityClinical Pharmacist [facility] [phone]

## 2022-01-01 NOTE — Patient Instructions (Signed)
Visit Information  Phone number for Pharmacist: [phone]   Goals Addressed   None     Care Plan : [place]  Updates made by Charlton Haws, RPH since 01/01/2022 12:00 AM     Problem: Hypertension, Hyperlipidemia, Diabetes, Atrial Fibrillation, Heart Failure, Coronary Artery Disease, and COPD   Priority: High     Long-Range Goal: Disease mgmt   Start Date: 10/23/2021  Expected End Date: 10/24/2022  This Visit's Progress: On track  Recent Progress: On track  Priority: High  Note:   Current Barriers:  Unable to independently afford treatment regimen Unable to maintain control of diabetes  Pharmacist Clinical Goal(s):  Patient will verbalize ability to afford treatment regimen adhere to plan to optimize therapeutic regimen for diabetes as evidenced by report of adherence to recommended medication management changes through collaboration with PharmD and provider.   Interventions: 1:1 collaboration with [name], MD regarding development and update of comprehensive plan of care as evidenced by provider attestation and co-signature Inter-disciplinary care team collaboration (see longitudinal plan of care) Comprehensive medication review performed; medication list updated in electronic medical record  Hypertension / Heart Failure (BP goal <130/80) -Query controlled - pt has not started new torsemide yet, he did not realize it was meant to replace furosemide -Last ejection fraction: >55% (Date: 02/2020) -HF type: Diastolic; NYHA Class: II/III -Follows with cardiology (Dr Nehemiah Massed) -Current treatment: Carvedilol 6.25 mg BID - Appropriate, Effective, Safe, Accessible Torsemide 10 mg daily - Appropriate, Query Effective (not started yet) Metolazone 2.5 mg twice weekly -Appropriate, Effective, Safe, Accessible -Medications previously tried: losartan, metoprolol, furosemide -Educated on BP goals and benefits of medications for prevention of heart attack,  stroke and kidney damage; -Counseled to monitor BP at home daily -Recommended to continue current medication  Atrial Fibrillation (Goal: prevent stroke and major bleeding) -Controlled - pt recently switched from Eliquis to warfarin due to cost issues; he has been following closely with PCP warfarin clinic and INR has been fluctuating, previously >12 8/10 requiring vitamin K, now < 2 -Cardioversion 07/2021 -CHADSVASC: 5 -Follows with cardiology (Dr Nehemiah Massed) -Current treatment: Carvedilol 6.25 mg BID - Appropriate, Effective, Safe, Accessible Amiodarone 200 mg daily -Appropriate, Effective, Safe, Accessible Warfarin 5 mg - 1/2 tab daily except 1 tab M/Th -Appropriate, Query Effective (INR < 2) -Medications previously tried: Eliquis (cost) -Counseled on increased risk of stroke due to Afib and benefits of anticoagulation for stroke prevention; importance of adherence to anticoagulant exactly as prescribed; bleeding risk associated with warfarin and importance of self-monitoring for signs/symptoms of bleeding; avoidance of NSAIDs due to increased bleeding risk with anticoagulants; importance of regular laboratory monitoring; -Recommended to continue current medication  Hyperlipidemia / CAD (LDL goal < 70) -Controlled - LDL 60 (06/2021) at goal -Hx CAD (3v disease on CT scan); also AAA; no aspirin due to warfarin -Current treatment: Simvastatin 20 mg daily HS - Appropriate, Effective, Safe, Accessible -Medications previously tried: n/a  -Educated on Cholesterol goals; Benefits of statin for ASCVD risk reduction; -Recommended to continue current medication  Diabetes (A1c goal <8%) -Uncontrolled, improving - A1c 8.7% (10/2021); he has started watching his diet and home BG appears improved; PCP would like him to be on Farxiga, he has not started this yet as [employer] has not shipped it yet -Current home glucose readings fasting glucose: 156 - 210 -Denies hypoglycemic/hyperglycemic symptoms -Pt does  not sleep well, breakfast is sometimes 2pm -Current medications: Metformin 1000 mg BID - Appropriate, Query Effective Glimepiride 4 mg daily -Appropriate,  Query Effective Farxiga 5 mg daily - not started yet [employer] supplies -Medications previously tried: [place] (cost), Januvia  -Educated on A1c and blood sugar goals; Complications of diabetes including kidney damage, retinal damage, and cardiovascular disease; Benefits of routine self-monitoring of blood sugar; -Recommended to continue current medication; will coordinate with [employer] to ship [place]  COPD (Goal: control symptoms and prevent exacerbations) -Controlled - 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; he has 2 inhalers remaining from [employer] -Gold Grade: Gold 3 (FEV1 30-49%) -Current COPD Classification:  D (high sx, >/=2 exacerbations/yr) -Pulmonary function testing: Spirometry 05/2018: pre: FVC 54%, FEV1 51%, ratio 0.71 post: FVC 54%, FEV1 49%, ratio 0.69 -Exacerbations requiring treatment in last 6 months: 0 (last 07/2020) -Current treatment  Breztri 160-9-4.8 mcg/act 2 puff BID (PAP)- Appropriate, Effective, Safe, Accessible Atrovent (ipratropium) 2 puffs PRN - Appropriate, Effective, Safe, Accessible Albuterol HFA -Appropriate, Effective, Safe, Accessible Albuterol neb -Appropriate, Effective, Safe, Accessible Montelukast 10 mg daily HS -Appropriate, Effective, Safe, Accessible Flonase nasal spray -Appropriate, Effective, Safe, Accessible -Medications previously tried: Trelegy, Advair, Spiriva -Patient reports consistent use of maintenance inhaler -Counseled on Proper inhaler technique; Benefits of consistent maintenance inhaler use -Recommend to continue current medication  Insomnia (Goal: manage symptoms) -Uncontrolled  -Hx OSA on CPAP; he is noncompliant with CPAP due to uncomfortable mask, he has not tried new mask in a few years; he has been referred to pulmonology but has not  made appt yet; pt provided with phone number for pulm office and advised to call for appt  Patient Goals/Self-Care Activities Patient will:  - take medications as prescribed as evidenced by patient report and record review focus on medication adherence by routine check glucose daily, document, and provide at future appointments check blood pressure daily, document, and provide at future appointments      The patient verbalized understanding of instructions, educational materials, and care plan provided today and DECLINED offer to receive copy of patient instructions, educational materials, and care plan.  Telephone follow up appointment with pharmacy team member scheduled for: 1 month  [name], PharmD, [facility] Clinical Pharmacist [facility] [phone]

## 2022-01-03 ENCOUNTER — Emergency Department: Payer: Medicare Other

## 2022-01-03 ENCOUNTER — Inpatient Hospital Stay: Payer: Medicare Other

## 2022-01-03 ENCOUNTER — Inpatient Hospital Stay
Admission: EM | Admit: 2022-01-03 | Discharge: 2022-01-05 | DRG: 190 | Disposition: A | Discharge status: Home Health | Payer: Medicare Other | Attending: Osteopathic Medicine | Admitting: Osteopathic Medicine

## 2022-01-03 DIAGNOSIS — R531 Weakness: Secondary | ICD-10-CM | POA: Diagnosis not present

## 2022-01-03 DIAGNOSIS — A419 Sepsis, unspecified organism: Principal | ICD-10-CM | POA: Diagnosis present

## 2022-01-03 DIAGNOSIS — L03116 Cellulitis of left lower limb: Secondary | ICD-10-CM | POA: Diagnosis not present

## 2022-01-03 DIAGNOSIS — R0602 Shortness of breath: Secondary | ICD-10-CM | POA: Diagnosis not present

## 2022-01-03 DIAGNOSIS — E876 Hypokalemia: Secondary | ICD-10-CM | POA: Diagnosis present

## 2022-01-03 DIAGNOSIS — F419 Anxiety disorder, unspecified: Secondary | ICD-10-CM | POA: Diagnosis present

## 2022-01-03 DIAGNOSIS — Z79899 Other long term (current) drug therapy: Secondary | ICD-10-CM | POA: Diagnosis not present

## 2022-01-03 DIAGNOSIS — F418 Other specified anxiety disorders: Secondary | ICD-10-CM | POA: Diagnosis present

## 2022-01-03 DIAGNOSIS — Z8679 Personal history of other diseases of the circulatory system: Secondary | ICD-10-CM

## 2022-01-03 DIAGNOSIS — Z20822 Contact with and (suspected) exposure to covid-19: Secondary | ICD-10-CM | POA: Diagnosis not present

## 2022-01-03 DIAGNOSIS — Z6841 Body Mass Index (BMI) 40.0 and over, adult: Secondary | ICD-10-CM

## 2022-01-03 DIAGNOSIS — L03115 Cellulitis of right lower limb: Secondary | ICD-10-CM | POA: Diagnosis present

## 2022-01-03 DIAGNOSIS — I10 Essential (primary) hypertension: Secondary | ICD-10-CM | POA: Diagnosis present

## 2022-01-03 DIAGNOSIS — M7989 Other specified soft tissue disorders: Secondary | ICD-10-CM | POA: Diagnosis not present

## 2022-01-03 DIAGNOSIS — J441 Chronic obstructive pulmonary disease with (acute) exacerbation: Secondary | ICD-10-CM | POA: Diagnosis not present

## 2022-01-03 DIAGNOSIS — I420 Dilated cardiomyopathy: Secondary | ICD-10-CM | POA: Diagnosis not present

## 2022-01-03 DIAGNOSIS — I219 Acute myocardial infarction, unspecified: Secondary | ICD-10-CM | POA: Diagnosis present

## 2022-01-03 DIAGNOSIS — R0902 Hypoxemia: Secondary | ICD-10-CM | POA: Diagnosis not present

## 2022-01-03 DIAGNOSIS — J302 Other seasonal allergic rhinitis: Secondary | ICD-10-CM | POA: Diagnosis present

## 2022-01-03 DIAGNOSIS — E1169 Type 2 diabetes mellitus with other specified complication: Secondary | ICD-10-CM | POA: Diagnosis present

## 2022-01-03 DIAGNOSIS — I2510 Atherosclerotic heart disease of native coronary artery without angina pectoris: Secondary | ICD-10-CM | POA: Diagnosis present

## 2022-01-03 DIAGNOSIS — I5043 Acute on chronic combined systolic (congestive) and diastolic (congestive) heart failure: Secondary | ICD-10-CM | POA: Diagnosis not present

## 2022-01-03 DIAGNOSIS — Z7984 Long term (current) use of oral hypoglycemic drugs: Secondary | ICD-10-CM

## 2022-01-03 DIAGNOSIS — W19XXXA Unspecified fall, initial encounter: Secondary | ICD-10-CM | POA: Diagnosis not present

## 2022-01-03 DIAGNOSIS — J9601 Acute respiratory failure with hypoxia: Secondary | ICD-10-CM | POA: Diagnosis present

## 2022-01-03 DIAGNOSIS — I5033 Acute on chronic diastolic (congestive) heart failure: Secondary | ICD-10-CM | POA: Diagnosis present

## 2022-01-03 DIAGNOSIS — Z87891 Personal history of nicotine dependence: Secondary | ICD-10-CM | POA: Diagnosis not present

## 2022-01-03 DIAGNOSIS — Z9981 Dependence on supplemental oxygen: Secondary | ICD-10-CM

## 2022-01-03 DIAGNOSIS — I4891 Unspecified atrial fibrillation: Secondary | ICD-10-CM | POA: Diagnosis present

## 2022-01-03 DIAGNOSIS — I252 Old myocardial infarction: Secondary | ICD-10-CM

## 2022-01-03 DIAGNOSIS — Z888 Allergy status to other drugs, medicaments and biological substances status: Secondary | ICD-10-CM

## 2022-01-03 DIAGNOSIS — Z7901 Long term (current) use of anticoagulants: Secondary | ICD-10-CM

## 2022-01-03 DIAGNOSIS — E785 Hyperlipidemia, unspecified: Secondary | ICD-10-CM | POA: Diagnosis present

## 2022-01-03 DIAGNOSIS — Z833 Family history of diabetes mellitus: Secondary | ICD-10-CM

## 2022-01-03 DIAGNOSIS — I110 Hypertensive heart disease with heart failure: Secondary | ICD-10-CM | POA: Diagnosis not present

## 2022-01-03 DIAGNOSIS — Z7951 Long term (current) use of inhaled steroids: Secondary | ICD-10-CM

## 2022-01-03 DIAGNOSIS — G4733 Obstructive sleep apnea (adult) (pediatric): Secondary | ICD-10-CM | POA: Diagnosis not present

## 2022-01-03 DIAGNOSIS — R600 Localized edema: Secondary | ICD-10-CM | POA: Diagnosis not present

## 2022-01-03 DIAGNOSIS — E669 Obesity, unspecified: Secondary | ICD-10-CM | POA: Diagnosis present

## 2022-01-03 DIAGNOSIS — Z743 Need for continuous supervision: Secondary | ICD-10-CM | POA: Diagnosis not present

## 2022-01-03 DIAGNOSIS — L97911 Non-pressure chronic ulcer of unspecified part of right lower leg limited to breakdown of skin: Secondary | ICD-10-CM | POA: Diagnosis not present

## 2022-01-03 DIAGNOSIS — R6889 Other general symptoms and signs: Secondary | ICD-10-CM | POA: Diagnosis not present

## 2022-01-03 LAB — COMPREHENSIVE METABOLIC PANEL WITH GFR
ALT: 17 U/L (ref 0–44)
AST: 34 U/L (ref 15–41)
Albumin: 3.9 g/dL (ref 3.5–5.0)
Alkaline Phosphatase: 71 U/L (ref 38–126)
Anion gap: 11 (ref 5–15)
BUN: 18 mg/dL (ref 8–23)
CO2: 41 mmol/L — ABNORMAL HIGH (ref 22–32)
Calcium: 9.4 mg/dL (ref 8.9–10.3)
Chloride: 85 mmol/L — ABNORMAL LOW (ref 98–111)
Creatinine, Ser: 1.03 mg/dL (ref 0.61–1.24)
GFR, Estimated: >60 mL/min
Glucose, Bld: 176 mg/dL — ABNORMAL HIGH (ref 70–99)
Potassium: 3.2 mmol/L — ABNORMAL LOW (ref 3.5–5.1)
Sodium: 137 mmol/L (ref 135–145)
Total Bilirubin: 1.2 mg/dL (ref 0.3–1.2)
Total Protein: 7.2 g/dL (ref 6.5–8.1)

## 2022-01-03 LAB — TROPONIN I (HIGH SENSITIVITY)
Troponin I (High Sensitivity): 28 ng/L — ABNORMAL HIGH
Troponin I (High Sensitivity): 25 ng/L — ABNORMAL HIGH (ref <18)

## 2022-01-03 LAB — CBC WITH DIFFERENTIAL/PLATELET
Abs Immature Granulocytes: 0.17 K/uL — ABNORMAL HIGH (ref 0.00–0.07)
Basophils Absolute: 0 K/uL (ref 0.0–0.1)
Basophils Relative: 0 %
Eosinophils Absolute: 0 K/uL (ref 0.0–0.5)
Eosinophils Relative: 0 %
HCT: 40 % (ref 39.0–52.0)
Hemoglobin: 11.3 g/dL — ABNORMAL LOW (ref 13.0–17.0)
Immature Granulocytes: 1 %
Lymphocytes Relative: 3 %
Lymphs Abs: 0.5 K/uL — ABNORMAL LOW (ref 0.7–4.0)
MCH: 22.2 pg — ABNORMAL LOW (ref 26.0–34.0)
MCHC: 28.3 g/dL — ABNORMAL LOW (ref 30.0–36.0)
MCV: 78.6 fL — ABNORMAL LOW (ref 80.0–100.0)
Monocytes Absolute: 1 K/uL (ref 0.1–1.0)
Monocytes Relative: 5 %
Neutro Abs: 17.4 K/uL — ABNORMAL HIGH (ref 1.7–7.7)
Neutrophils Relative %: 91 %
Platelets: 213 K/uL (ref 150–400)
RBC: 5.09 MIL/uL (ref 4.22–5.81)
RDW: 21.3 % — ABNORMAL HIGH (ref 11.5–15.5)
WBC: 19.2 K/uL — ABNORMAL HIGH (ref 4.0–10.5)
nRBC: 0 % (ref 0.0–0.2)

## 2022-01-03 LAB — RESP PANEL BY RT-PCR (FLU A&B, COVID) ARPGX2
Influenza A by PCR: NEGATIVE
Influenza B by PCR: NEGATIVE
SARS Coronavirus 2 by RT PCR: NEGATIVE

## 2022-01-03 LAB — GLUCOSE, CAPILLARY
Glucose-Capillary: 173 mg/dL — ABNORMAL HIGH (ref 70–99)
Glucose-Capillary: 197 mg/dL — ABNORMAL HIGH (ref 70–99)
Glucose-Capillary: 245 mg/dL — ABNORMAL HIGH (ref 70–99)

## 2022-01-03 LAB — MAGNESIUM: Magnesium: 1.8 mg/dL (ref 1.7–2.4)

## 2022-01-03 LAB — BRAIN NATRIURETIC PEPTIDE: B Natriuretic Peptide: 289.4 pg/mL — ABNORMAL HIGH (ref 0.0–100.0)

## 2022-01-03 LAB — C-REACTIVE PROTEIN: CRP: 29.2 mg/dL — ABNORMAL HIGH (ref <1.0)

## 2022-01-03 LAB — PROTIME-INR
INR: 1.7 — ABNORMAL HIGH (ref 0.8–1.2)
Prothrombin Time: 19.5 seconds — ABNORMAL HIGH (ref 11.4–15.2)

## 2022-01-03 LAB — HEMOGLOBIN A1C
Hgb A1c MFr Bld: 8.6 % — ABNORMAL HIGH (ref 4.8–5.6)
Mean Plasma Glucose: 200.12 mg/dL

## 2022-01-03 LAB — LACTIC ACID, PLASMA: Lactic Acid, Venous: 1.6 mmol/L (ref 0.5–1.9)

## 2022-01-03 LAB — SEDIMENTATION RATE: Sed Rate: 16 mm/hr (ref 0–20)

## 2022-01-03 LAB — PROCALCITONIN: Procalcitonin: 0.43 ng/mL

## 2022-01-03 MED ORDER — VITAMIN D 25 MCG (1000 UNIT) PO TABS
2000.0000 [IU] | ORAL_TABLET | Freq: Every day | ORAL | Status: DC
  Administered 2022-01-03 – 2022-01-05 (×3): 2000 [IU] via ORAL
  Filled 2022-01-03 (×3): qty 2

## 2022-01-03 MED ORDER — ACETAMINOPHEN 325 MG PO TABS
650.0000 mg | ORAL_TABLET | Freq: Once | ORAL | Status: AC
  Administered 2022-01-03: 650 mg via ORAL
  Filled 2022-01-03: qty 2

## 2022-01-03 MED ORDER — LACTATED RINGERS IV SOLN: INTRAVENOUS | Status: DC

## 2022-01-03 MED ORDER — PANTOPRAZOLE SODIUM 40 MG PO TBEC
40.0000 mg | DELAYED_RELEASE_TABLET | Freq: Two times a day (BID) | ORAL | Status: DC
  Administered 2022-01-03 – 2022-01-05 (×4): 40 mg via ORAL
  Filled 2022-01-03 (×4): qty 1

## 2022-01-03 MED ORDER — HYDRALAZINE HCL 20 MG/ML IJ SOLN
5.0000 mg | INTRAMUSCULAR | Status: DC | PRN
Start: 2022-01-03 — End: 2022-01-05
  Administered 2022-01-03: 5 mg via INTRAVENOUS
  Filled 2022-01-03: qty 1

## 2022-01-03 MED ORDER — ACETAMINOPHEN 325 MG PO TABS
650.0000 mg | ORAL_TABLET | Freq: Four times a day (QID) | ORAL | Status: DC | PRN
  Administered 2022-01-04: 650 mg via ORAL
  Filled 2022-01-03: qty 2

## 2022-01-03 MED ORDER — AMIODARONE HCL 200 MG PO TABS
200.0000 mg | ORAL_TABLET | Freq: Every day | ORAL | Status: DC
  Administered 2022-01-03 – 2022-01-05 (×3): 200 mg via ORAL
  Filled 2022-01-03 (×3): qty 1

## 2022-01-03 MED ORDER — HYDROXYZINE HCL 50 MG PO TABS
50.0000 mg | ORAL_TABLET | Freq: Two times a day (BID) | ORAL | Status: DC | PRN
Start: 2022-01-03 — End: 2022-01-05

## 2022-01-03 MED ORDER — SIMVASTATIN 20 MG PO TABS
20.0000 mg | ORAL_TABLET | Freq: Every day | ORAL | Status: DC
  Administered 2022-01-03 – 2022-01-04 (×2): 20 mg via ORAL
  Filled 2022-01-03 (×2): qty 1

## 2022-01-03 MED ORDER — CARVEDILOL 3.125 MG PO TABS
6.2500 mg | ORAL_TABLET | Freq: Two times a day (BID) | ORAL | Status: DC
Start: 2022-01-03 — End: 2022-01-04
  Administered 2022-01-03 – 2022-01-04 (×2): 6.25 mg via ORAL
  Filled 2022-01-03 (×2): qty 2

## 2022-01-03 MED ORDER — DM-GUAIFENESIN ER 30-600 MG PO TB12: 1.0000 | ORAL_TABLET | Freq: Two times a day (BID) | ORAL | Status: DC | PRN

## 2022-01-03 MED ORDER — METHYLPREDNISOLONE SODIUM SUCC 40 MG IJ SOLR
40.0000 mg | Freq: Two times a day (BID) | INTRAMUSCULAR | Status: DC
Start: 2022-01-03 — End: 2022-01-04
  Administered 2022-01-03 – 2022-01-04 (×2): 40 mg via INTRAVENOUS
  Filled 2022-01-03 (×2): qty 1

## 2022-01-03 MED ORDER — MONTELUKAST SODIUM 10 MG PO TABS
10.0000 mg | ORAL_TABLET | Freq: Every day | ORAL | Status: DC
  Administered 2022-01-03 – 2022-01-04 (×2): 10 mg via ORAL
  Filled 2022-01-03 (×2): qty 1

## 2022-01-03 MED ORDER — VANCOMYCIN HCL IN DEXTROSE 1-5 GM/200ML-% IV SOLN
1000.0000 mg | Freq: Two times a day (BID) | INTRAVENOUS | Status: DC
  Administered 2022-01-03 – 2022-01-04 (×3): 1000 mg via INTRAVENOUS
  Filled 2022-01-03 (×4): qty 200

## 2022-01-03 MED ORDER — RISAQUAD PO CAPS
1.0000 | ORAL_CAPSULE | Freq: Every day | ORAL | Status: DC
  Administered 2022-01-03 – 2022-01-05 (×3): 1 via ORAL
  Filled 2022-01-03 (×3): qty 1

## 2022-01-03 MED ORDER — IPRATROPIUM-ALBUTEROL 0.5-2.5 (3) MG/3ML IN SOLN
3.0000 mL | RESPIRATORY_TRACT | Status: DC
  Administered 2022-01-03 (×3): 3 mL via RESPIRATORY_TRACT
  Filled 2022-01-03 (×3): qty 3

## 2022-01-03 MED ORDER — FUROSEMIDE 10 MG/ML IJ SOLN
40.0000 mg | Freq: Two times a day (BID) | INTRAMUSCULAR | Status: DC
  Administered 2022-01-03 – 2022-01-05 (×4): 40 mg via INTRAVENOUS
  Filled 2022-01-03 (×5): qty 4

## 2022-01-03 MED ORDER — DOCUSATE SODIUM 100 MG PO CAPS: 100.0000 mg | ORAL_CAPSULE | Freq: Every day | ORAL | Status: DC | PRN

## 2022-01-03 MED ORDER — WARFARIN SODIUM 4 MG PO TABS
4.0000 mg | ORAL_TABLET | Freq: Once | ORAL | Status: AC
  Administered 2022-01-03: 4 mg via ORAL
  Filled 2022-01-03: qty 1

## 2022-01-03 MED ORDER — INSULIN ASPART 100 UNIT/ML IJ SOLN
0.0000 [IU] | Freq: Every day | INTRAMUSCULAR | Status: DC
  Administered 2022-01-03 – 2022-01-04 (×2): 2 [IU] via SUBCUTANEOUS
  Filled 2022-01-03 (×2): qty 1

## 2022-01-03 MED ORDER — VITAMIN B-12 1000 MCG PO TABS
1000.0000 ug | ORAL_TABLET | Freq: Every day | ORAL | Status: DC
  Administered 2022-01-04 – 2022-01-05 (×2): 1000 ug via ORAL
  Filled 2022-01-03 (×2): qty 1

## 2022-01-03 MED ORDER — IPRATROPIUM-ALBUTEROL 0.5-2.5 (3) MG/3ML IN SOLN
3.0000 mL | Freq: Four times a day (QID) | RESPIRATORY_TRACT | Status: DC
  Administered 2022-01-04: 3 mL via RESPIRATORY_TRACT
  Filled 2022-01-03: qty 3

## 2022-01-03 MED ORDER — VANCOMYCIN HCL 2000 MG/400ML IV SOLN
2000.0000 mg | Freq: Once | INTRAVENOUS | Status: AC
Start: 2022-01-03 — End: 2022-01-03
  Administered 2022-01-03: 2000 mg via INTRAVENOUS
  Filled 2022-01-03: qty 400

## 2022-01-03 MED ORDER — INSULIN ASPART 100 UNIT/ML IJ SOLN
0.0000 [IU] | Freq: Three times a day (TID) | INTRAMUSCULAR | Status: DC
  Administered 2022-01-03: 2 [IU] via SUBCUTANEOUS
  Administered 2022-01-04: 7 [IU] via SUBCUTANEOUS
  Administered 2022-01-04: 3 [IU] via SUBCUTANEOUS
  Administered 2022-01-04 – 2022-01-05 (×2): 5 [IU] via SUBCUTANEOUS
  Administered 2022-01-05: 7 [IU] via SUBCUTANEOUS
  Filled 2022-01-03 (×6): qty 1

## 2022-01-03 MED ORDER — VANCOMYCIN HCL 500 MG/100ML IV SOLN
500.0000 mg | Freq: Once | INTRAVENOUS | Status: AC
  Administered 2022-01-03: 500 mg via INTRAVENOUS
  Filled 2022-01-03: qty 100

## 2022-01-03 MED ORDER — ONDANSETRON HCL 4 MG/2ML IJ SOLN: 4.0000 mg | Freq: Three times a day (TID) | INTRAMUSCULAR | Status: DC | PRN

## 2022-01-03 MED ORDER — WARFARIN - PHARMACIST DOSING INPATIENT: Freq: Every day | Status: DC

## 2022-01-03 MED ORDER — ORAL CARE MOUTH RINSE: 15.0000 mL | OROMUCOSAL | Status: DC | PRN

## 2022-01-03 MED ORDER — METHYLPREDNISOLONE SODIUM SUCC 125 MG IJ SOLR
125.0000 mg | Freq: Once | INTRAMUSCULAR | Status: AC
Start: 2022-01-03 — End: 2022-01-03
  Administered 2022-01-03: 125 mg via INTRAVENOUS
  Filled 2022-01-03: qty 2

## 2022-01-03 MED ORDER — SODIUM CHLORIDE 0.9 % IV SOLN
2.0000 g | INTRAVENOUS | Status: DC
  Administered 2022-01-03 – 2022-01-05 (×3): 2 g via INTRAVENOUS
  Filled 2022-01-03 (×2): qty 20
  Filled 2022-01-03: qty 2

## 2022-01-03 MED ORDER — CITALOPRAM HYDROBROMIDE 10 MG PO TABS
10.0000 mg | ORAL_TABLET | Freq: Every day | ORAL | Status: DC
  Administered 2022-01-03 – 2022-01-05 (×3): 10 mg via ORAL
  Filled 2022-01-03 (×3): qty 1

## 2022-01-03 MED ORDER — POTASSIUM CHLORIDE CRYS ER 20 MEQ PO TBCR
40.0000 meq | EXTENDED_RELEASE_TABLET | ORAL | Status: AC
  Administered 2022-01-03 (×2): 40 meq via ORAL
  Filled 2022-01-03 (×2): qty 2

## 2022-01-03 MED ORDER — IPRATROPIUM-ALBUTEROL 0.5-2.5 (3) MG/3ML IN SOLN
3.0000 mL | Freq: Once | RESPIRATORY_TRACT | Status: AC
  Administered 2022-01-03: 3 mL via RESPIRATORY_TRACT
  Filled 2022-01-03: qty 3

## 2022-01-03 MED ORDER — ALBUTEROL SULFATE (2.5 MG/3ML) 0.083% IN NEBU: 2.5000 mg | INHALATION_SOLUTION | RESPIRATORY_TRACT | Status: DC | PRN

## 2022-01-03 NOTE — Assessment & Plan Note (Signed)
Patient has bilateral leg edema, elevated BNP 289, vascular congestion on chest x-ray, clinically consistent with CHF exacerbation.  2D echo on 11/19/2021 showed EF of 60 to 65% with grade 2 diastolic dysfunction.  -Will admit to tele bed as inpatient -Lasix 40 mg bid by IV -Daily weights -strict I/O's -Low salt diet -Fluid restriction -will not obtain REDs Vest reading due to BMI of 42

## 2022-01-03 NOTE — Assessment & Plan Note (Signed)
History of CAD and myocardial injury due to multiple acute issues (CHF and COPD exacerbation and sepsis, cellulitis): Troponin level 28.  No chest pain -Will not start aspirin since patient is on Coumadin -Trend troponin -Check A1c, FLP -zocor

## 2022-01-03 NOTE — Assessment & Plan Note (Signed)
-   Continue home medication 

## 2022-01-03 NOTE — Assessment & Plan Note (Signed)
-  see above 

## 2022-01-03 NOTE — Assessment & Plan Note (Signed)
  BMI= 42.89  and BW= 139.5 -Diet and exercise.   -Encourage to lose weight.

## 2022-01-03 NOTE — ED Triage Notes (Signed)
Pt BIB [facility] from home C/O BLE weakness, swelling. Pt reports increased swelling and redness to LLE. Per EMS, SaO2 60% RA, arrives on 8L NRB with SaO2 100%. Pt reportedly wears 2L [place] but did not have it on when EMS arrived.

## 2022-01-03 NOTE — H&P (Signed)
History and Physical    [name] VOH:[id number] DOB: Sep 04, 1955 DOA: 01/03/2022  Referring MD/NP/PA:   PCP: [name] Bush, MD   Patient coming from:  The patient is coming from home.  At baseline, pt is independent for most of ADL.        Chief Complaint: leg pain  HPI: [name] is a 66 y.o. male with medical history significant of COPD on 2 L oxygen, hyperlipidemia, diabetes mellitus, GERD, depression, OSA (uses CPAP not consistently), morbid obesity with a BMI of 42.89, atrial fibrillation on Coumadin, dCHF, CAD, anemia, marijuana use, who presents with leg pain.  Patient states that he has chronic lower extremity edema which has worsened in the past 4 weeks.  The left leg is worse than the right.  Both legs are erythematous and painful, but left leg is more severe.  The left leg pain is constant, sharp, moderate to severe, nonradiating.  Patient does not have fever or chills.  Patient has dry cough and shortness breath, denies chest pain.  Patient states that he had a loose stool bowel movement, but denies active diarrhea.  No nausea, vomiting or abdominal pain.  No symptoms of UTI.  Patient was found to have 1 episode of oxygen desaturation to 60% on room air which improved to 93% on home 2 L oxygen at home.  Currently patient does not have respiratory distress.  Data reviewed independently and ED Course: pt was found to have WBC 19.2, troponin level 28, BNP 289, negative COVID PCR, lactic acid 1.6, GFR> 60, potassium 3.2, magnesium 1.8.  Temperature 99, blood pressure 124/71, heart rate 79, RR 22.  Chest x-ray showed cardiomegaly and vascular congestion.  Patient is admitted to telemetry bed as inpatient.   EKG: I have personally reviewed.  Sinus rhythm, QTc 498, RAD, early R wave question  Review of Systems:   General: no fevers, chills, no body weight gain, has fatigue HEENT: no blurry vision, hearing changes or sore throat Respiratory: has dyspnea, coughing,  wheezing CV: no chest pain, no palpitations GI: no nausea, vomiting, abdominal pain, diarrhea, constipation GU: no dysuria, burning on urination, increased urinary frequency, hematuria  Ext: has leg edema and leg pain Neuro: no unilateral weakness, numbness, or tingling, no vision change or hearing loss Skin: no rash, no skin tear. MSK: No muscle spasm, no deformity, no limitation of range of movement in spin Heme: No easy bruising.  Travel history: No recent long distant travel.   Allergy:  Allergies  Allergen Reactions   Atorvastatin Other (See Comments)    Dizziness   Lisinopril Cough    Past Medical History:  Diagnosis Date   Abnormal drug screen 2015   MJ positive x3 - one more and we will stop prescribing ativan (08/2013).   Anemia    Angina    Anxiety    Arthritis    CAD (coronary artery disease)    nonobstructive   Cannabis abuse    Chronic systolic CHF (congestive heart failure) ([place]) 2013   NYHA Class II/III   COPD (chronic obstructive pulmonary disease) ([place])    Diabetes type 2, uncontrolled    declines DSME   Dilated cardiomyopathy ([place]) 2013   now improved   Ex-smoker    Frequent headaches    History of atrial fibrillation 2013   chronic, s/p ablation prior on coumadin   Hyperlipidemia    Migraine    Nonischemic cardiomyopathy ([place]) 2013   EF 25% per Dr [name]   Obesity  OSA (obstructive sleep apnea)    does not use CPAP - unable to tolerate   Seasonal allergies     Past Surgical History:  Procedure Laterality Date   ATRIAL FLUTTER ABLATION N/A 09/21/2011   Procedure: ATRIAL FLUTTER ABLATION;  Surgeon: [name] Grayer, MD;  Location: [facility] CATH LAB;  Service: Cardiovascular;  Laterality: N/A;   CARDIAC ELECTROPHYSIOLOGY MAPPING AND ABLATION  2013   for atrial flutter   CARDIOVASCULAR STRESS TEST  03/2012   ETT WNL [name])   CARDIOVERSION N/A 08/12/2021   Procedure: CARDIOVERSION;  Surgeon: [name] Skains, MD;  Location: [facility] ORS;  Service:  Cardiovascular;  Laterality: N/A;   COLONOSCOPY WITH PROPOFOL N/A 05/22/2020   TA, diverticulosis, descending colon ulcer biopsy WNL, int hem [name], Darren, MD)   ESOPHAGOGASTRODUODENOSCOPY (EGD) WITH PROPOFOL N/A 05/22/2020   WNL ([name])   US ECHOCARDIOGRAPHY  2014   EF 50%, nl LV fxn, RV nl size/function, mild mitral insuff    Social History:  reports that he quit smoking about 3 years ago. His smoking use included cigarettes. He has a 31.00 pack-year smoking history. He has never used smokeless tobacco. He reports current drug use. Drug: Marijuana. He reports that he does not drink alcohol.  Family History:  Family History  Problem Relation Age of Onset   Heart failure Father 34   Cancer Mother 28       NHL   Hypertension Maternal Grandfather    CAD Maternal Grandfather 74       MI   Diabetes Other        grandparents   Cancer Brother 38       lung (smoker)     Prior to Admission medications   Medication Sig Start Date End Date Taking? Authorizing Provider  ACCU-CHEK FASTCLIX LANCETS MISC Check blood sugar once daily and as instructed. Dx 250.00 05/10/13   [name] Bush, MD  albuterol (PROVENTIL) (2.5 MG/3ML) 0.083% nebulizer solution INHALE 3 ML BY NEBULIZATION EVERY 6 HOURS AS NEEDED FOR WHEEZING OR SHORTNESS OF BREATH 11/24/21   [name] Bush, MD  albuterol (VENTOLIN HFA) 108 (90 Base) MCG/ACT inhaler INHALE 2 PUFFS BY MOUTH EVERY 6 HOURS AS NEEDED FOR WHEEZE OR SHORTNESS OF BREATH 09/02/21   [name] Bush, MD  amiodarone (PACERONE) 200 MG tablet Take 1 tablet (200 mg total) by mouth daily. 09/16/21   [name] Epley, MD  Blood Glucose Monitoring Suppl (BLOOD GLUCOSE MONITOR SYSTEM) W/DEVICE KIT by Does not apply route. Use to check sugar once daily and as needed Dx: E11.9 **ONE TOUCH VERIO**    [provider]  Budeson-Glycopyrrol-Formoterol (BREZTRI AEROSPHERE) 160-9-4.8 MCG/ACT AERO Inhale 2 puffs into the lungs 2 (two) times daily. 10/23/21   [name] Bush, MD  carvedilol (COREG) 6.25 MG tablet TAKE 1 TABLET BY MOUTH 2 TIMES DAILY WITH A MEAL. 10/30/21   [name] Bush, MD  Cholecalciferol (VITAMIN D) 50 MCG (2000 UT) CAPS Take 1 capsule (2,000 Units total) by mouth daily. 02/05/19   [name] Bush, MD  citalopram (CELEXA) 10 MG tablet Take 1 tablet (10 mg total) by mouth daily. 07/20/21   [name] Bush, MD  dapagliflozin propanediol (FARXIGA) 5 MG TABS tablet Take 1 tablet (5 mg total) by mouth daily before breakfast. 11/30/21   [name] Bush, MD  Docusate Calcium (STOOL SOFTENER PO) Take 1 tablet by mouth daily.    [provider]  fluticasone (FLONASE) 50 MCG/ACT nasal spray SPRAY 2 SPRAYS INTO EACH NOSTRIL EVERY DAY Patient taking differently: Place 1 spray  into both nostrils daily as needed for allergies. 08/19/20   [name] Bush, MD  furosemide (LASIX) 80 MG tablet Take 80 mg by mouth 2 (two) times daily. 12/25/21   [provider]  glimepiride (AMARYL) 4 MG tablet TAKE 1 TABLET BY MOUTH EVERY DAY WITH BREAKFAST 11/20/21   [name] Bush, MD  glucose blood [facility] VERIO) test strip Check blood sugar 3 times a day 10/22/21   [name] Bush, MD  hydrOXYzine (ATARAX) 50 MG tablet TAKE 1 TABLET (50 MG TOTAL) BY MOUTH 2 (TWO) TIMES DAILY AS NEEDED FOR ANXIETY. 05/05/21   [name] Bush, MD  ipratropium (ATROVENT HFA) 17 MCG/ACT inhaler Inhale 2 puffs into the lungs every 6 (six) hours as needed for wheezing. 09/02/20   [name] Bush, MD  metFORMIN (GLUCOPHAGE) 1000 MG tablet TAKE 1 TABLET (1,000 MG TOTAL) BY MOUTH 2 (TWO) TIMES DAILY WITH A MEAL. 10/26/21   [name] Bush, MD  metolazone (ZAROXOLYN) 2.5 MG tablet Take 1 tablet (2.5 mg total) by mouth 2 (two) times a week. 09/10/21   [name] Bush, MD  montelukast (SINGULAIR) 10 MG tablet TAKE 1 TABLET BY MOUTH EVERYDAY AT BEDTIME 07/07/21   [name] Bush, MD  NON FORMULARY 1,200 mg daily. SEA MOSS    [provider]  NON FORMULARY daily.  SUPER BEETS    [provider]  NON FORMULARY 500 mg once. TURMERIC    [provider]  pantoprazole (PROTONIX) 40 MG tablet TAKE 1 TABLET (40 MG TOTAL) BY MOUTH 2 (TWO) TIMES DAILY BEFORE A MEAL. 07/29/21   [name] Bush, MD  Probiotic Product (PROBIOTIC DAILY PO) Take 1 tablet by mouth daily.    [provider]  simvastatin (ZOCOR) 20 MG tablet TAKE 1 TABLET BY MOUTH EVERY DAY IN THE EVENING 12/29/21   [name] Bush, MD  torsemide (DEMADEX) 10 MG tablet Take 1 tablet (10 mg total) by mouth daily. 12/23/21   [name] Bush, MD  triamcinolone cream (KENALOG) 0.1 % Apply 1 application. topically 2 (two) times daily. Apply to AA. Patient taking differently: Apply 1 application  topically 3 (three) times daily. Apply to [place]. 07/20/21 07/20/22  [name] Bush, MD  vitamin B-12 (CYANOCOBALAMIN) 1000 MCG tablet Take 1 tablet (1,000 mcg total) by mouth every Monday, Wednesday, and Friday. Patient taking differently: Take 1,000 mcg by mouth daily. 07/22/21   [name] Bush, MD  warfarin (COUMADIN) 5 MG tablet TAKE 1/2 TABLET BY MOUTH DAILY EXCEPT TAKE NOTHING ON [facility] OR AS DIRECTED BY ANTICOAGULATION CLINIC 11/23/21   [name] Bush, MD    Physical Exam: Vitals:   01/03/22 1100 01/03/22 1130 01/03/22 1230 01/03/22 1330  BP: 124/71 122/81 101/88 122/74  Pulse: 71 72 67 71  Resp: '17 15 19 14  ' Temp:    98.9 F (37.2 C)  TempSrc:    Oral  SpO2: 97% 95% 94% 94%  Weight:      Height:       General: Not in acute distress HEENT:       Eyes: PERRL, EOMI, no scleral icterus.       ENT: No discharge from the ears and nose, no pharynx injection, no tonsillar enlargement.        Neck: Difficult to assess JVD due to morbid obesity, no bruit, no mass felt. Heme: No neck lymph node enlargement. Cardiac: S1/S2, RRR, No murmurs, No gallops or rubs. Respiratory: Has fine crackles and wheezing bilaterally GI: Soft, nondistended, nontender, no rebound pain, no  organomegaly, BS present. GU: No hematuria Ext: Has  erythema, tenderness, swelling, warmth in the legs, left leg is worse than the right.      Musculoskeletal: No joint deformities, No joint redness or warmth, no limitation of ROM in spin. Skin: No rashes.  Neuro: Alert, oriented X3, cranial nerves II-XII grossly intact, moves all extremities normally Psych: Patient is not psychotic, no suicidal or hemocidal ideation.  Labs on Admission: I have personally reviewed following labs and imaging studies  CBC: Recent Labs  Lab 12/28/21 1029 01/03/22 0959  WBC 9.6 19.2*  NEUTROABS  --  17.4*  HGB 10.9* 11.3*  HCT 39.1 40.0  MCV 78.4* 78.6*  PLT 307 341   Basic Metabolic Panel: Recent Labs  Lab 12/28/21 1029 01/03/22 0959  NA 137 137  K 3.6 3.2*  CL 88* 85*  CO2 37* 41*  GLUCOSE 140* 176*  BUN 22 18  CREATININE 0.94 1.03  CALCIUM 9.1 9.4  MG 1.9 1.8   GFR: Estimated Creatinine Clearance: 100.8 mL/min (by C-G formula based on SCr of 1.03 mg/dL). Liver Function Tests: Recent Labs  Lab 01/03/22 0959  AST 34  ALT 17  ALKPHOS 71  BILITOT 1.2  PROT 7.2  ALBUMIN 3.9   No results for input(s): "LIPASE", "AMYLASE" in the last 168 hours. No results for input(s): "AMMONIA" in the last 168 hours. Coagulation Profile: Recent Labs  Lab 12/28/21 1029 12/31/21 0000 01/03/22 0959  INR 1.4* 1.4* 1.7*   Cardiac Enzymes: No results for input(s): "CKTOTAL", "CKMB", "CKMBINDEX", "TROPONINI" in the last 168 hours. BNP (last 3 results) Recent Labs    07/20/21 1630  PROBNP 153.0*   HbA1C: No results for input(s): "HGBA1C" in the last 72 hours. CBG: No results for input(s): "GLUCAP" in the last 168 hours. Lipid Profile: No results for input(s): "CHOL", "HDL", "LDLCALC", "TRIG", "CHOLHDL", "LDLDIRECT" in the last 72 hours. Thyroid Function Tests: No results for input(s): "TSH", "T4TOTAL", "FREET4", "T3FREE", "THYROIDAB" in the last 72 hours. Anemia Panel: No results for  input(s): "VITAMINB12", "FOLATE", "FERRITIN", "TIBC", "IRON", "RETICCTPCT" in the last 72 hours. Urine analysis:    Component Value Date/Time   COLORURINE YELLOW (A) 12/22/2018 1453   APPEARANCEUR CLEAR (A) 12/22/2018 1453   APPEARANCEUR Clear 02/19/2012 1128   LABSPEC 1.008 12/22/2018 1453   LABSPEC 1.010 02/19/2012 1128   PHURINE 8.0 12/22/2018 1453   GLUCOSEU NEGATIVE 12/22/2018 1453   GLUCOSEU Negative 02/19/2012 1128   HGBUR NEGATIVE 12/22/2018 1453   BILIRUBINUR NEGATIVE 12/22/2018 1453   BILIRUBINUR 1 mg/dL 07/26/2016 1624   BILIRUBINUR Negative 02/19/2012 1128   KETONESUR 5 (A) 12/22/2018 1453   PROTEINUR NEGATIVE 12/22/2018 1453   UROBILINOGEN 1.0 07/26/2016 1624   NITRITE NEGATIVE 12/22/2018 1453   LEUKOCYTESUR NEGATIVE 12/22/2018 1453   LEUKOCYTESUR Negative 02/19/2012 1128   Sepsis Labs: '@LABRCNTIP' (procalcitonin:4,lacticidven:4) ) Recent Results (from the past 240 hour(s))  Resp Panel by RT-PCR (Flu A&B, Covid) Anterior Nasal Swab     Status: None   Collection Time: 01/03/22  9:59 AM   Specimen: Anterior Nasal Swab  Result Value Ref Range Status   SARS Coronavirus 2 by RT PCR NEGATIVE NEGATIVE Final    Comment: (NOTE) SARS-CoV-2 target nucleic acids are NOT DETECTED.  The SARS-CoV-2 RNA is generally detectable in upper respiratory specimens during the acute phase of infection. The lowest concentration of SARS-CoV-2 viral copies this assay can detect is 138 copies/mL. A negative result does not preclude SARS-Cov-2 infection and should not be used as the sole basis for treatment or other patient management decisions. A negative  result may occur with  improper specimen collection/handling, submission of specimen other than nasopharyngeal swab, presence of viral mutation(s) within the areas targeted by this assay, and inadequate number of viral copies(<138 copies/mL). A negative result must be combined with clinical observations, patient history, and  epidemiological information. The expected result is Negative.  Fact Sheet for Patients:  EntrepreneurPulse.com.au  Fact Sheet for Healthcare Providers:  IncredibleEmployment.be  This test is no t yet approved or cleared by the [place] FDA and  has been authorized for detection and/or diagnosis of SARS-CoV-2 by FDA under an Emergency Use Authorization (EUA). This EUA will remain  in effect (meaning this test can be used) for the duration of the COVID-19 declaration under Section 564(b)(1) of the Act, 21 U.S.C.section 360bbb-3(b)(1), unless the authorization is terminated  or revoked sooner.       Influenza A by PCR NEGATIVE NEGATIVE Final   Influenza B by PCR NEGATIVE NEGATIVE Final    Comment: (NOTE) The Xpert Xpress SARS-CoV-2/FLU/RSV plus assay is intended as an aid in the diagnosis of influenza from Nasopharyngeal swab specimens and should not be used as a sole basis for treatment. Nasal washings and aspirates are unacceptable for Xpert Xpress SARS-CoV-2/FLU/RSV testing.  Fact Sheet for Patients: EntrepreneurPulse.com.au  Fact Sheet for Healthcare Providers: IncredibleEmployment.be  This test is not yet approved or cleared by the [place] FDA and has been authorized for detection and/or diagnosis of SARS-CoV-2 by FDA under an Emergency Use Authorization (EUA). This EUA will remain in effect (meaning this test can be used) for the duration of the COVID-19 declaration under Section 564(b)(1) of the Act, 21 U.S.C. section 360bbb-3(b)(1), unless the authorization is terminated or revoked.  Performed at [facility], [address] [place], [place] [address]      Radiological Exams on Admission: DG Chest [facility] 1 View  Result Date: 01/03/2022 CLINICAL DATA:  Shortness of breath with bilateral lower extremity weakness/swelling. Oxygen dependent. EXAM: PORTABLE CHEST 1 VIEW  COMPARISON:  12/28/2021 FINDINGS: Lungs are adequately inflated demonstrate stable minimal prominence of the central pulmonary vasculature which may represent mild vascular congestion. No focal airspace consolidation or effusion. Mild stable cardiomegaly. Remainder of the exam is unchanged. IMPRESSION: Mild stable cardiomegaly with suggestion of minimal vascular congestion. Electronically Signed   By: Marin Olp M.D.   On: 01/03/2022 10:10      Assessment/Plan Principal Problem:   Acute on chronic diastolic CHF (congestive heart failure) (HCC) Active Problems:   COPD exacerbation (HCC)   Sepsis (HCC)   Cellulitis of both lower extremities   Coronary artery disease   Myocardial infarct (HCC)   History of atrial fibrillation   Hyperlipidemia   Hypertension   Type 2 diabetes mellitus with other specified complication (HCC)   Hypokalemia   Depression with anxiety   Obesity, morbid, BMI 40.0-49.9 (HCC)   Assessment and Plan: * Acute on chronic diastolic CHF (congestive heart failure) (HCC) Patient has bilateral leg edema, elevated BNP 289, vascular congestion on chest x-ray, clinically consistent with CHF exacerbation.  2D echo on 11/19/2021 showed EF of 60 to 65% with grade 2 diastolic dysfunction.  -Will admit to tele bed as inpatient -Lasix 40 mg bid by IV -Daily weights -strict I/O's -Low salt diet -Fluid restriction -will not obtain REDs Vest reading due to BMI of 42  COPD exacerbation (HCC) Patient has wheezing, shortness breath, clinically consistent with COPD exacerbation  -Bronchodilators -Solu-Medrol 40 mg IV bid - Abx: pt is on vancomycin and Rocephin which  are for leg cellulitis -Mucinex for cough  -Incentive spirometry -sputum culture -Nasal cannula oxygen as needed to maintain O2 saturation 93% or greater  Sepsis ([place]) Sepsis due to cellulitis of lower extremities (left leg is worse than the right): Patient meets criteria for sepsis with WBC 19.2, RR 22.   Lactic acid is normal. - Empiric antimicrobial treatment with vancomycin and Rocephin - Blood cultures x 2  - ESR and CRP - will get Procalcitonin - IVF: Will not give IV fluid due to CHF exacerbation in the normal lactic acid level - f/u LE venous Doppler to rule out DVT  Cellulitis of both lower extremities -see above  Coronary artery disease History of CAD and myocardial injury due to multiple acute issues (CHF and COPD exacerbation and sepsis, cellulitis): Troponin level 28.  No chest pain -Will not start aspirin since patient is on Coumadin -Trend troponin -Check A1c, FLP -zocor  Myocardial infarct ([place]) -see above  History of atrial fibrillation Heart rate 79 - Continue Coumadin per pharm -Coreg -Amiodarone  Hyperlipidemia - Zocor  Hypertension - IV hydralazine as needed -Patient is on IV Lasix -Coreg  Type 2 diabetes mellitus with other specified complication ([place]) Recent A1c 8.7, poorly controlled.  Patient is taking Amaryl, metformin, Farxiga -Sliding scale insulin  Hypokalemia Potassium 3.2, magnesium 1.8 -Repleted potassium  Depression with anxiety - Continue home medication  Obesity, morbid, BMI 40.0-49.9 (HCC)  BMI= 42.89  and BW= 139.5 -Diet and exercise.   -Encourage to lose weight.           DVT ppx: on Coumdin  Code Status: Full code  Family Communication: I offered to call patient's family, but he refused  Disposition Plan:  Anticipate discharge back to previous environment  Consults called:  none  Admission status and Level of care: Telemetry Medical:   as inpt      Dispo: The patient is from: Home              Anticipated d/c is to: Home              Anticipated d/c date is: 2 days              Patient currently is not medically stable to d/c.    Severity of Illness:  The appropriate patient status for this patient is INPATIENT. Inpatient status is judged to be reasonable and necessary in order to provide the  required intensity of service to ensure the patient's safety. The patient's presenting symptoms, physical exam findings, and initial radiographic and laboratory data in the context of their chronic comorbidities is felt to place them at high risk for further clinical deterioration. Furthermore, it is not anticipated that the patient will be medically stable for discharge from the hospital within 2 midnights of admission.   * I certify that at the point of admission it is my clinical judgment that the patient will require inpatient hospital care spanning beyond 2 midnights from the point of admission due to high intensity of service, high risk for further deterioration and high frequency of surveillance required.*       Date of Service 01/03/2022    Ivor Costa [facility]   If 7PM-7AM, please contact night-coverage [email or web address] 01/03/2022, 2:11 PM

## 2022-01-03 NOTE — Assessment & Plan Note (Signed)
-   IV hydralazine as needed -Patient is on IV Lasix -Coreg

## 2022-01-03 NOTE — ED Notes (Signed)
Advised nurse that patient has ready bed 

## 2022-01-03 NOTE — ED Notes (Signed)
Portable Xray at bedside.

## 2022-01-03 NOTE — Consult Note (Signed)
Pharmacy Antibiotic Note  [name] is a 66 y.o. male admitted on 01/03/2022 with cellulitis. Patient presented with worsening generalized weakness, shortness of breath, bilateral lower extremity edema over the past week. PMH significant for T2DM, CHF, CAD, COPD on 2L [place] chronically, Afib (on warfarin has been consulted for Vancomycin dosing. On exam LLE is red, swollen, and painful. Pharmacy has been consulted for Vancomycin dosing.  Plan: Day 1 of antibiotics Give Vancomycin 2500 mg IV x1 followed by 1000 mg IV Q12H. Goal AUC 400-550. Expected AUC: 429.5 Expected Css min: 12.9 SCr used: 1.03  Weight used: IBW, Vd used: 0.5 (BMI 42.8) Patient is also on Ceftriaxone 2g Q24H  Continue to monitor renal function and culture results  Height: '5\' 11"'$  (180.3 cm) Weight: (!) 139.5 kg (307 lb 8 oz) IBW/kg (Calculated) : 75.3  Temp (24hrs), Avg:99 F (37.2 C), Min:98.9 F (37.2 C), Max:99 F (37.2 C)  Recent Labs  Lab 12/28/21 1029 01/03/22 0959  WBC 9.6 19.2*  CREATININE 0.94 1.03  LATICACIDVEN  --  1.6    Estimated Creatinine Clearance: 100.8 mL/min (by C-G formula based on SCr of 1.03 mg/dL).    Allergies  Allergen Reactions   Atorvastatin Other (See Comments)    Dizziness   Lisinopril Cough    Antimicrobials this admission: 9/10 Ceftriaxone >>  9/10 Vancomycin >>   Dose adjustments this admission: N/A  Microbiology results: 9/10 BCx: in process  Thank you for allowing pharmacy to be a part of this patient's care.  Gretel Acre, PharmD PGY1 Pharmacy Resident 01/03/2022 2:08 PM

## 2022-01-03 NOTE — Plan of Care (Signed)
  Problem: Fluid Volume: Goal: Ability to maintain a balanced intake and output will improve Outcome: Progressing   Problem: Health Behavior/Discharge Planning: Goal: Ability to identify and utilize available resources and services will improve Outcome: Progressing   Problem: Metabolic: Goal: Ability to maintain appropriate glucose levels will improve Outcome: Progressing   Problem: Nutritional: Goal: Maintenance of adequate nutrition will improve Outcome: Progressing   Problem: Skin Integrity: Goal: Risk for impaired skin integrity will decrease Outcome: Progressing   Problem: Tissue Perfusion: Goal: Adequacy of tissue perfusion will improve Outcome: Progressing

## 2022-01-03 NOTE — ED Provider Notes (Addendum)
[facility] Provider Note   Event Date/Time   First MD Initiated Contact with Patient 01/03/22 [phone]     (approximate) History  Leg Swelling  HPI [name] is a 66 y.o. male with a stated past medical history of type 2 diabetes, CHF, CAD, COPD chronic 2 L oxygen by nasal cannula who presents via EMS with complaints of worsening generalized weakness, shortness of breath, bilateral lower extremity edema over the past week.  Patient is also concerned as his left lower extremity has become more red, swollen, and painful over this time as well.  Patient states that he was told he has a wound to this leg and was sent to wound care however per patient, wound care said that his "swelling is what is causing this chronic wound" and he would need to "get the swelling under control before wound care could treat his wound". ROS: Patient currently denies any vision changes, tinnitus, difficulty speaking, facial droop, sore throat, chest pain, shortness of breath, abdominal pain, nausea/vomiting/diarrhea, dysuria, or weakness/numbness/paresthesias in any extremity   Physical Exam  Triage Vital Signs: ED Triage Vitals  Enc Vitals Group     BP --      Pulse --      Resp --      Temp --      Temp src --      SpO2 01/03/22 0947 (!) 60 %     Weight 01/03/22 0951 (!) 307 lb 8 oz (139.5 kg)     Height 01/03/22 0951 '5\' 11"'$  (1.803 m)     Head Circumference --      Peak Flow --      Pain Score 01/03/22 0950 8     Pain Loc --      Pain Edu? --      Excl. in [place]? --    Most recent vital signs: Vitals:   01/03/22 1030 01/03/22 1100  BP: 131/83 124/71  Pulse: 73 71  Resp: 14 17  Temp:    SpO2: 93% 97%   General: Awake, oriented x4. CV:  Good peripheral perfusion.  Resp:  Normal effort.  Expiratory expiratory wheezes over bilateral lung fields Abd:  Moderate distention.  Nontender to palpation Other:  Obese elderly Caucasian male laying in bed in no acute distress.   There is 2+ pitting edema to bilateral lower extremities with erythema and induration circumferentially on the left lower extremity below the knee to the foot ED Results / Procedures / Treatments  Labs (all labs ordered are listed, but only abnormal results are displayed) Labs Reviewed  COMPREHENSIVE METABOLIC PANEL - Abnormal; Notable for the following components:      Result Value   Potassium 3.2 (*)    Chloride 85 (*)    CO2 41 (*)    Glucose, Bld 176 (*)    All other components within normal limits  BRAIN NATRIURETIC PEPTIDE - Abnormal; Notable for the following components:   B Natriuretic Peptide 289.4 (*)    All other components within normal limits  CBC WITH DIFFERENTIAL/PLATELET - Abnormal; Notable for the following components:   WBC 19.2 (*)    Hemoglobin 11.3 (*)    MCV 78.6 (*)    MCH 22.2 (*)    MCHC 28.3 (*)    RDW 21.3 (*)    Neutro Abs 17.4 (*)    Lymphs Abs 0.5 (*)    Abs Immature Granulocytes 0.17 (*)    All other components within normal  limits  TROPONIN I (HIGH SENSITIVITY) - Abnormal; Notable for the following components:   Troponin I (High Sensitivity) 28 (*)    All other components within normal limits  RESP PANEL BY RT-PCR (FLU A&B, COVID) ARPGX2  CULTURE, BLOOD (ROUTINE X 2)  CULTURE, BLOOD (ROUTINE X 2)  LACTIC ACID, PLASMA  URINALYSIS, ROUTINE W REFLEX MICROSCOPIC  TROPONIN I (HIGH SENSITIVITY)   EKG ED ECG REPORT I, Naaman Plummer, the attending physician, personally viewed and interpreted this ECG. Date: 01/03/2022 EKG Time: 0952 Rate: 81 Rhythm: normal sinus rhythm QRS Axis: normal Intervals: normal ST/T Wave abnormalities: normal Narrative Interpretation: no evidence of acute ischemia RADIOLOGY ED MD interpretation: 2 view chest x-ray interpreted by me and shows mild stable cardiomegaly with suggestion of minimal vascular congestion -Agree with radiology assessment Official radiology report(s): DG Chest Port 1 View  Result Date:  01/03/2022 CLINICAL DATA:  Shortness of breath with bilateral lower extremity weakness/swelling. Oxygen dependent. EXAM: PORTABLE CHEST 1 VIEW COMPARISON:  12/28/2021 FINDINGS: Lungs are adequately inflated demonstrate stable minimal prominence of the central pulmonary vasculature which may represent mild vascular congestion. No focal airspace consolidation or effusion. Mild stable cardiomegaly. Remainder of the exam is unchanged. IMPRESSION: Mild stable cardiomegaly with suggestion of minimal vascular congestion. Electronically Signed   By: Marin Olp M.D.   On: 01/03/2022 10:10   PROCEDURES: Critical Care performed: Yes, see critical care procedure note(s) .1-3 Lead EKG Interpretation  Performed by: Naaman Plummer, MD Authorized by: Naaman Plummer, MD     Interpretation: normal     ECG rate:  72   ECG rate assessment: normal     Rhythm: sinus rhythm     Ectopy: none     Conduction: normal   CRITICAL CARE Performed by: Naaman Plummer  Total critical care time: 33 minutes  Critical care time was exclusive of separately billable procedures and treating other patients.  Critical care was necessary to treat or prevent imminent or life-threatening deterioration.  Critical care was time spent personally by me on the following activities: development of treatment plan with patient and/or surrogate as well as nursing, discussions with consultants, evaluation of patient's response to treatment, examination of patient, obtaining history from patient or surrogate, ordering and performing treatments and interventions, ordering and review of laboratory studies, ordering and review of radiographic studies, pulse oximetry and re-evaluation of patient's condition.  MEDICATIONS ORDERED IN ED: Medications  lactated ringers infusion ( Intravenous New Bag/Given 01/03/22 1110)  cefTRIAXone (ROCEPHIN) 2 g in sodium chloride 0.9 % 100 mL IVPB (0 g Intravenous Stopped 01/03/22 1109)  acetaminophen (TYLENOL)  tablet 650 mg (has no administration in time range)  ipratropium-albuterol (DUONEB) 0.5-2.5 (3) MG/3ML nebulizer solution 3 mL (has no administration in time range)  methylPREDNISolone sodium succinate (SOLU-MEDROL) 125 mg/2 mL injection 125 mg (has no administration in time range)   IMPRESSION / MDM / ASSESSMENT AND PLAN / ED COURSE  I reviewed the triage vital signs and the nursing notes.                             The patient is on the cardiac monitor to evaluate for evidence of arrhythmia and/or significant heart rate changes. Patient's presentation is most consistent with acute presentation with potential threat to life or bodily function. The patient appears to be suffering from a moderate/severe exacerbation of COPD.  Based on the history, exam, CXR/EKG reviewed by me, and further  workup I dont suspect any other emergent cause of this presentation, such as pneumonia, acute coronary syndrome, congestive heart failure, pulmonary embolism, or pneumothorax.  Patient also shows signs of likely cellulitis to the left lower extremity  ED Interventions: bronchodilators, steroids, antibiotics, reassess  Reassessment: After treatment, the patients shortness of breath is improving but patient is still requiring increased supplemental oxygenation  Disposition: Admit   FINAL CLINICAL IMPRESSION(S) / ED DIAGNOSES   Final diagnoses:  Acute respiratory failure with hypoxia (HCC)  Cellulitis of leg, left  COPD exacerbation ([place])   Rx / DC Orders   ED Discharge Orders     None      Note:  This document was prepared using Dragon voice recognition software and may include unintentional dictation errors.   Naaman Plummer, MD 01/03/22 1138    Naaman Plummer, MD 01/03/22 1139

## 2022-01-03 NOTE — Assessment & Plan Note (Signed)
Sepsis due to cellulitis of lower extremities (left leg is worse than the right): Patient meets criteria for sepsis with WBC 19.2, RR 22.  Lactic acid is normal. - Empiric antimicrobial treatment with vancomycin and Rocephin - Blood cultures x 2  - ESR and CRP - will get Procalcitonin - IVF: Will not give IV fluid due to CHF exacerbation in the normal lactic acid level - f/u LE venous Doppler to rule out DVT

## 2022-01-03 NOTE — Assessment & Plan Note (Signed)
Recent A1c 8.7, poorly controlled.  Patient is taking Amaryl, metformin, Farxiga -Sliding scale insulin

## 2022-01-03 NOTE — Progress Notes (Signed)
CODE SEPSIS - PHARMACY COMMUNICATION  **Broad Spectrum Antibiotics should be administered within 1 hour of Sepsis diagnosis**  Time Code Sepsis Called/Page Received: 1041  Antibiotics Ordered: ceftriaxone  Time of 1st antibiotic administration: 1044  Additional action taken by pharmacy: N/A   Gretel Acre, PharmD PGY1 Pharmacy Resident 01/03/2022 10:49 AM

## 2022-01-03 NOTE — Assessment & Plan Note (Signed)
Heart rate 79 - Continue Coumadin per pharm -Coreg -Amiodarone

## 2022-01-03 NOTE — Assessment & Plan Note (Signed)
Potassium 3.2, magnesium 1.8 -Repleted potassium

## 2022-01-03 NOTE — ED Notes (Signed)
SaO2 noted to drop to 77% with good pleth on monitor. Upon, checking on patient, [place] off of patient. [place] replaced but SaO2 increased only to 80% after several minutes. O2 increased to 4L, SaO2 improved to 94%. MD notified.

## 2022-01-03 NOTE — Sepsis Progress Note (Signed)
Sepsis protocol monitored by eLink 

## 2022-01-03 NOTE — Assessment & Plan Note (Signed)
Patient has wheezing, shortness breath, clinically consistent with COPD exacerbation  -Bronchodilators -Solu-Medrol 40 mg IV bid - Abx: pt is on vancomycin and Rocephin which are for leg cellulitis -Mucinex for cough  -Incentive spirometry -sputum culture -Nasal cannula oxygen as needed to maintain O2 saturation 93% or greater

## 2022-01-03 NOTE — Consult Note (Signed)
ANTICOAGULATION CONSULT NOTE - Initial Consult  Pharmacy Consult for warfarin Indication: atrial fibrillation  Allergies  Allergen Reactions   Atorvastatin Other (See Comments)    Dizziness   Lisinopril Cough    Patient Measurements: Height: '5\' 11"'$  (180.3 cm) Weight: (!) 139.5 kg (307 lb 8 oz) IBW/kg (Calculated) : 75.3   Vital Signs: Temp: 98.9 F (37.2 C) (09/10 1330) Temp Source: Oral (09/10 1330) BP: 122/74 (09/10 1330) Pulse Rate: 71 (09/10 1330)  Labs: Recent Labs    01/03/22 0959 01/03/22 1158  HGB 11.3*  --   HCT 40.0  --   PLT 213  --   LABPROT 19.5*  --   INR 1.7*  --   CREATININE 1.03  --   TROPONINIHS 28* 25*    Estimated Creatinine Clearance: 100.8 mL/min (by C-G formula based on SCr of 1.03 mg/dL).   Medical History: Past Medical History:  Diagnosis Date   Abnormal drug screen 2015   MJ positive x3 - one more and we will stop prescribing ativan (08/2013).   Anemia    Angina    Anxiety    Arthritis    CAD (coronary artery disease)    nonobstructive   Cannabis abuse    Chronic systolic CHF (congestive heart failure) ([place]) 2013   NYHA Class II/III   COPD (chronic obstructive pulmonary disease) (HCC)    Diabetes type 2, uncontrolled    declines DSME   Dilated cardiomyopathy ([place]) 2013   now improved   Ex-smoker    Frequent headaches    History of atrial fibrillation 2013   chronic, s/p ablation prior on coumadin   Hyperlipidemia    Migraine    Nonischemic cardiomyopathy ([place]) 2013   EF 25% per Dr Nehemiah Massed   Obesity    OSA (obstructive sleep apnea)    does not use CPAP - unable to tolerate   Seasonal allergies     Medications:  Scheduled:   acidophilus  1 capsule Oral Daily   amiodarone  200 mg Oral Daily   carvedilol  6.25 mg Oral BID WC   cholecalciferol  2,000 Units Oral Daily   citalopram  10 mg Oral Daily   [START ON 01/04/2022] cyanocobalamin  1,000 mcg Oral Daily   furosemide  40 mg Intravenous Q12H   insulin aspart   0-5 Units Subcutaneous QHS   insulin aspart  0-9 Units Subcutaneous TID WC   ipratropium-albuterol  3 mL Nebulization Q4H   methylPREDNISolone (SOLU-MEDROL) injection  40 mg Intravenous Q12H   montelukast  10 mg Oral QHS   pantoprazole  40 mg Oral BID AC   potassium chloride  40 mEq Oral Q4H   simvastatin  20 mg Oral q1800   Infusions:   cefTRIAXone (ROCEPHIN)  IV Stopped (01/03/22 1109)   PRN: acetaminophen, albuterol, dextromethorphan-guaiFENesin, docusate sodium, hydrALAZINE, hydrOXYzine, ondansetron (ZOFRAN) IV  Assessment: [name] is a 66 y.o. male presenting with worsening generalized weakness, shortness of breath, bilateral lower extremity edema over the past week. PMH significant for T2DM, CHF, CAD, COPD on 2L [place] chronically, Afib (on warfarin). Per recent CPP note on 12/31/2021, patient on warfarin 5 mg Mon/Thu and 2.5 mg Tue/Wed/Fri/Sat/Sun PTA for Afib (weekly dose 22.5 mg). Per med history, patient has not yet started the new schedule as described and was still taking warfarin 2.5 mg daily except Wed (weekly dose 15 mg). Last dose of warfarin 2.5 mg PTA was taken 9/8. Pharmacy has been consulted to manage warfarin while in patient.  Baseline Labs: PT 19.5, INR 1.7, Hgb 11.3, Hct 40, Plt 213   Goal of Therapy:  INR 2-3 Monitor platelets by anticoagulation protocol: Yes   Plan:  Give warfarin 4 mg x1 today Monitor INR daily and CBC at least once weekly while on warfarin  Thank you for allowing pharmacy to be a part of this patient's care.  Gretel Acre, PharmD PGY1 Pharmacy Resident 01/03/2022 2:03 PM

## 2022-01-03 NOTE — Assessment & Plan Note (Signed)
-   Zocor

## 2022-01-04 DIAGNOSIS — I5033 Acute on chronic diastolic (congestive) heart failure: Secondary | ICD-10-CM | POA: Diagnosis not present

## 2022-01-04 LAB — PROTIME-INR
INR: 2.7 — ABNORMAL HIGH (ref 0.8–1.2)
Prothrombin Time: 28.4 seconds — ABNORMAL HIGH (ref 11.4–15.2)

## 2022-01-04 LAB — BASIC METABOLIC PANEL
Anion gap: 10 (ref 5–15)
BUN: 23 mg/dL (ref 8–23)
CO2: 40 mmol/L — ABNORMAL HIGH (ref 22–32)
Calcium: 9 mg/dL (ref 8.9–10.3)
Chloride: 87 mmol/L — ABNORMAL LOW (ref 98–111)
Creatinine, Ser: 1.05 mg/dL (ref 0.61–1.24)
GFR, Estimated: >60 mL/min (ref >60)
Glucose, Bld: 225 mg/dL — ABNORMAL HIGH (ref 70–99)
Potassium: 3.3 mmol/L — ABNORMAL LOW (ref 3.5–5.1)
Sodium: 137 mmol/L (ref 135–145)

## 2022-01-04 LAB — CBC
HCT: 36.9 % — ABNORMAL LOW (ref 39.0–52.0)
Hemoglobin: 10.2 g/dL — ABNORMAL LOW (ref 13.0–17.0)
MCH: 21.8 pg — ABNORMAL LOW (ref 26.0–34.0)
MCHC: 27.6 g/dL — ABNORMAL LOW (ref 30.0–36.0)
MCV: 78.8 fL — ABNORMAL LOW (ref 80.0–100.0)
Platelets: 189 10*3/uL (ref 150–400)
RBC: 4.68 MIL/uL (ref 4.22–5.81)
RDW: 21.2 % — ABNORMAL HIGH (ref 11.5–15.5)
WBC: 16.8 10*3/uL — ABNORMAL HIGH (ref 4.0–10.5)
nRBC: 0 % (ref 0.0–0.2)

## 2022-01-04 LAB — GLUCOSE, CAPILLARY
Glucose-Capillary: 223 mg/dL — ABNORMAL HIGH (ref 70–99)
Glucose-Capillary: 343 mg/dL — ABNORMAL HIGH (ref 70–99)
Glucose-Capillary: 277 mg/dL — ABNORMAL HIGH (ref 70–99)
Glucose-Capillary: 250 mg/dL — ABNORMAL HIGH (ref 70–99)

## 2022-01-04 LAB — LIPID PANEL
Cholesterol: 104 mg/dL (ref 0–200)
HDL: 42 mg/dL (ref >40)
LDL Cholesterol: 50 mg/dL (ref 0–99)
Total CHOL/HDL Ratio: 2.5 RATIO
Triglycerides: 59 mg/dL (ref <150)
VLDL: 12 mg/dL (ref 0–40)

## 2022-01-04 LAB — MAGNESIUM: Magnesium: 1.9 mg/dL (ref 1.7–2.4)

## 2022-01-04 MED ORDER — WARFARIN SODIUM 1 MG PO TABS
1.0000 mg | ORAL_TABLET | Freq: Once | ORAL | Status: AC
  Administered 2022-01-04: 1 mg via ORAL
  Filled 2022-01-04: qty 1

## 2022-01-04 MED ORDER — POTASSIUM CHLORIDE 10 MEQ/100ML IV SOLN
10.0000 meq | INTRAVENOUS | Status: AC
  Administered 2022-01-04 (×2): 10 meq via INTRAVENOUS
  Filled 2022-01-04: qty 100

## 2022-01-04 MED ORDER — IPRATROPIUM-ALBUTEROL 0.5-2.5 (3) MG/3ML IN SOLN
3.0000 mL | Freq: Three times a day (TID) | RESPIRATORY_TRACT | Status: DC
  Administered 2022-01-04 – 2022-01-05 (×3): 3 mL via RESPIRATORY_TRACT
  Filled 2022-01-04 (×4): qty 3

## 2022-01-04 MED ORDER — CARVEDILOL 3.125 MG PO TABS
3.1250 mg | ORAL_TABLET | Freq: Two times a day (BID) | ORAL | Status: DC
  Administered 2022-01-04 – 2022-01-05 (×2): 3.125 mg via ORAL
  Filled 2022-01-04 (×2): qty 1

## 2022-01-04 MED ORDER — WARFARIN SODIUM 2.5 MG PO TABS: 2.5000 mg | ORAL_TABLET | Freq: Once | ORAL | Status: DC

## 2022-01-04 MED ORDER — PREDNISONE 20 MG PO TABS
40.0000 mg | ORAL_TABLET | Freq: Every day | ORAL | Status: DC
  Administered 2022-01-05: 40 mg via ORAL
  Filled 2022-01-04: qty 2

## 2022-01-04 NOTE — Hospital Course (Addendum)
[name] is a 66 y.o. male with medical history significant of COPD on 2 L oxygen at home, hyperlipidemia, diabetes mellitus, GERD, depression, OSA (uses CPAP not consistently), morbid obesity with a BMI of 42.89, atrial fibrillation on Coumadin, dCHF, CAD, anemia, marijuana use, who presents with leg pain 01/03/2022 from home via EMS. Patient states that he has SOB and  chronic lower extremity edema which has worsened in the past 4 weeks.  The left leg is worse than the right.  Recent admission 12/28/2021 to 09/10: ED course and early admission-Per EMS, SPO2 60% room air (not wearing his usual O2), arrived on 8 L NRB SPO2 100%.  WBC 19.2, troponin 28, BNP 289, lactic acid 1.6, temperature 99, RR 22, CXR cardiomegaly and vascular congestion.  Admitted for acute on chronic diastolic CHF, COPD exacerbation, sepsis due to cellulitis bilateral lower extremities with left worse than right.  Started on diuresis, fluid restriction, bronchodilators, steroids, vancomycin plus ceftriaxone.  Blood cultures pending.  Did not receive IV fluids due to CHF exacerbation and given normal lactic acid.  LE venous Dopplers to evaluate for DVT negative.  Troponins trended and flat - no ACS.  09/11: WBC trending down to 16.8.  Other than hypoxia, VSS.  No longer meeting criteria for sepsis. Reports pain in L leg is improved. Plan additional day IV abx and await BCx. Does not appear seriously fluid overloaded at this time.     Consultants:  none  Procedures: none  Antimicrobials: Ceftriaxone 2 g IV every 24 hours, today will be day 2 Vancomycin 1000 mg IV every 12 hours, today will be day 2     ASSESSMENT & PLAN:   Principal Problem:   Acute on chronic diastolic CHF (congestive heart failure) (HCC) Active Problems:   COPD exacerbation (HCC)   Sepsis ([place])   Cellulitis of both lower extremities   Coronary artery disease   Myocardial infarct (HCC)   History of atrial fibrillation   Hyperlipidemia    Hypertension   Type 2 diabetes mellitus with other specified complication (HCC)   Hypokalemia   Depression with anxiety   Obesity, morbid, BMI 40.0-49.9 (HCC)   Acute on chronic diastolic CHF (congestive heart failure) ([place]) 2D echo on 11/19/2021 showed EF of 60 to 65% with grade 2 diastolic dysfunction. Will admit to tele bed as inpatient Defer echocardiogram for now given that he has had one recently, will obtain echo if not improving/if worse Lasix 40 mg bid by IV Daily weights strict I/O's Low salt diet Fluid restriction will not obtain REDs Vest reading due to BMI of 42  COPD exacerbation ([place]) Patient has wheezing, shortness breath, clinically consistent with COPD exacerbation Bronchodilators Solu-Medrol 40 mg IV bid --> prednisone 40 mg daily  Mucinex for cough  Incentive spirometry sputum culture Nasal cannula oxygen as needed to maintain O2 saturation 93% or greater  Sepsis ([place]) - sepsis resolved as of a.m. 01/04/2022 Cellulitis of both lower extremities Sepsis due to cellulitis of lower extremities (left leg is worse than the right): Patient meets criteria for sepsis with WBC 19.2, RR 22.  Lactic acid is normal.  CRP elevated at 29.2. No DVT Empiric antimicrobial treatment with vancomycin and Rocephin Blood cultures x 2 - pending results IVF: Will not give IV fluid due to CHF exacerbation in the normal lactic acid level, but patient appears improving without this treatment   Coronary artery disease History Myocardial infarct (HCC) History of CAD and myocardial injury due to multiple acute issues  ACS ruled out Troponin level 28 --> 25.  No chest pain LDL at goal at 50 A1c not at goal, at 8.6 Will not start aspirin since patient is on Coumadin Continue home Zocor Treat diabetes as below Reduced carvedilol given occasional soft BP and pt reports occasional dizziness   History of atrial fibrillation Heart rate 79 Continue Coumadin per  pharmacy Coreg Amiodarone  Hyperlipidemia Zocor  Hypertension IV hydralazine as needed IV Lasix Coreg - reduced as above  Per guidelines, should be on ACE/ARB, consider adding this if BP tolerates / defer to outpatient   Type 2 diabetes mellitus with other specified complication (HCC) Recent A1c 8.7, poorly controlled.   Patient is taking Amaryl, metformin, Farxiga Sliding scale insulin  Hypokalemia Potassium 3.2, magnesium 1.8 Repleted potassium Trend BMP  Depression with anxiety Continue home medication  Obesity, morbid, BMI 40.0-49.9 (HCC) BMI= 42.89  and BW= 139.5 Diet and exercise.   Encourage to lose weight.     DVT prophylaxis: Continued on home Coumadin, he is taking this for A-fib Pertinent IV fluids/nutrition: No IV fluids given CHF exacerbation, despite sepsis. Central lines / invasive devices: None  Code Status: Full code Family Communication: None at this time  Disposition: Remains inpatient, anticipate discharge to previous home environment pending clinical course TOC needs: May need HH/SNF Barriers to discharge / significant pending items: Treating sepsis, COPD, CHF.  Anticipate discharge once clinically improved and negative blood cultures, appropriate diuresis, reduced oxygen requirement.  May be here a few days

## 2022-01-04 NOTE — Progress Notes (Addendum)
PROGRESS NOTE    [name]   ENI:[id number] DOB: 1955/11/10  DOA: 01/03/2022 Date of Service: 01/04/22 PCP: [name], MD     Brief Narrative / Hospital Course:  [name] is a 66 y.o. male with medical history significant of COPD on 2 L oxygen at home, hyperlipidemia, diabetes mellitus, GERD, depression, OSA (uses CPAP not consistently), morbid obesity with a BMI of 42.89, atrial fibrillation on Coumadin, dCHF, CAD, anemia, marijuana use, who presents with leg pain 01/03/2022 from home via EMS. Patient states that he has SOB and  chronic lower extremity edema which has worsened in the past 4 weeks.  The left leg is worse than the right.  Recent admission 12/28/2021 to 09/10: ED course and early admission-Per EMS, SPO2 60% room air (not wearing his usual O2), arrived on 8 L NRB SPO2 100%.  WBC 19.2, troponin 28, BNP 289, lactic acid 1.6, temperature 99, RR 22, CXR cardiomegaly and vascular congestion.  Admitted for acute on chronic diastolic CHF, COPD exacerbation, sepsis due to cellulitis bilateral lower extremities with left worse than right.  Started on diuresis, fluid restriction, bronchodilators, steroids, vancomycin plus ceftriaxone.  Blood cultures pending.  Did not receive IV fluids due to CHF exacerbation and given normal lactic acid.  LE venous Dopplers to evaluate for DVT negative.  Troponins trended and flat - no ACS.  09/11: WBC trending down to 16.8.  Other than hypoxia, VSS.  No longer meeting criteria for sepsis. Reports pain in L leg is improved. Plan additional day IV abx and await BCx. Does not appear seriously fluid overloaded at this time.     Consultants:  none  Procedures: none  Antimicrobials: Ceftriaxone 2 g IV every 24 hours, today will be day 2 Vancomycin 1000 mg IV every 12 hours, today will be day 2     ASSESSMENT & PLAN:   Principal Problem:   Acute on chronic diastolic CHF (congestive heart failure) (HCC) Active Problems:   COPD  exacerbation (HCC)   Sepsis ([place])   Cellulitis of both lower extremities   Coronary artery disease   Myocardial infarct (HCC)   History of atrial fibrillation   Hyperlipidemia   Hypertension   Type 2 diabetes mellitus with other specified complication (HCC)   Hypokalemia   Depression with anxiety   Obesity, morbid, BMI 40.0-49.9 (HCC)   Acute on chronic diastolic CHF (congestive heart failure) ([place]) 2D echo on 11/19/2021 showed EF of 60 to 65% with grade 2 diastolic dysfunction. Will admit to tele bed as inpatient Defer echocardiogram for now given that he has had one recently, will obtain echo if not improving/if worse Lasix 40 mg bid by IV Daily weights strict I/O's Low salt diet Fluid restriction will not obtain REDs Vest reading due to BMI of 42  COPD exacerbation ([place]) Patient has wheezing, shortness breath, clinically consistent with COPD exacerbation Bronchodilators Solu-Medrol 40 mg IV bid --> prednisone 40 mg daily  Mucinex for cough  Incentive spirometry sputum culture Nasal cannula oxygen as needed to maintain O2 saturation 93% or greater  Sepsis ([place]) - sepsis resolved as of a.m. 01/04/2022 Cellulitis of both lower extremities Sepsis due to cellulitis of lower extremities (left leg is worse than the right): Patient meets criteria for sepsis with WBC 19.2, RR 22.  Lactic acid is normal.  CRP elevated at 29.2. No DVT Empiric antimicrobial treatment with vancomycin and Rocephin Blood cultures x 2 - pending results IVF: Will not give IV fluid due to CHF  exacerbation in the normal lactic acid level, but patient appears improving without this treatment   Coronary artery disease History Myocardial infarct (HCC) History of CAD and myocardial injury due to multiple acute issues  ACS ruled out Troponin level 28 --> 25.  No chest pain LDL at goal at 50 A1c not at goal, at 8.6 Will not start aspirin since patient is on Coumadin Continue home Zocor Treat diabetes as  below Reduced carvedilol given occasional soft BP and pt reports occasional dizziness   History of atrial fibrillation Heart rate 79 Continue Coumadin per pharmacy Coreg Amiodarone  Hyperlipidemia Zocor  Hypertension IV hydralazine as needed IV Lasix Coreg - reduced as above  Per guidelines, should be on ACE/ARB, consider adding this if BP tolerates / defer to outpatient   Type 2 diabetes mellitus with other specified complication (HCC) Recent A1c 8.7, poorly controlled.   Patient is taking Amaryl, metformin, Farxiga Sliding scale insulin  Hypokalemia Potassium 3.2, magnesium 1.8 Repleted potassium Trend BMP  Depression with anxiety Continue home medication  Obesity, morbid, BMI 40.0-49.9 (HCC) BMI= 42.89  and BW= 139.5 Diet and exercise.   Encourage to lose weight.     DVT prophylaxis: Continued on home Coumadin, he is taking this for A-fib Pertinent IV fluids/nutrition: No IV fluids given CHF exacerbation, despite sepsis. Central lines / invasive devices: None  Code Status: Full code Family Communication: None at this time  Disposition: Remains inpatient, anticipate discharge to previous home environment pending clinical course TOC needs: May need HH/SNF Barriers to discharge / significant pending items: Treating sepsis, COPD, CHF.  Anticipate discharge once clinically improved and negative blood cultures, appropriate diuresis, reduced oxygen requirement.  May be here a few days             Subjective:  Patient reports pain is improved, mild dizziness has been ongoing for some time but no worse since being here. Reports leg is feeling a lot better, breathing is ok, he is amenable to wear CPAP tonight.        Objective:  Vitals:   01/04/22 0422 01/04/22 0751 01/04/22 0802 01/04/22 1305  BP:  109/61    Pulse:  70    Resp:  14    Temp:  98.3 F (36.8 C)    TempSrc:      SpO2:  (!) 85% 94% 92%  Weight: 133.7 kg     Height:         Intake/Output Summary (Last 24 hours) at 01/04/2022 1348 Last data filed at 01/04/2022 1038 Gross per 24 hour  Intake 1335.95 ml  Output 1300 ml  Net 35.95 ml   Filed Weights   01/03/22 0951 01/04/22 0422  Weight: (!) 139.5 kg 133.7 kg    Examination:  Constitutional:  VS as above General Appearance: alert, well-developed, well-nourished, NAD Eyes: Normal lids and conjunctive, non-icteric sclera Ears, Nose, Mouth, Throat: Normal external appearance MMM Neck: No masses, trachea midline Respiratory: Normal respiratory effort Diminished breath sounds bilaterally  Cardiovascular: S1/S2 normal No murmur No rub/gallop auscultated No JVD (+) lower extremity edema Gastrointestinal: No tenderness No masses No hernia appreciated Musculoskeletal/Skin:  No clubbing/cyanosis of digits Symmetrical movement in all extremities Erythema LLE some improved from previous photos  Neurological: No cranial nerve deficit on limited exam Alert Psychiatric: Normal judgment/insight Normal mood and affect       Scheduled Medications:   acidophilus  1 capsule Oral Daily   amiodarone  200 mg Oral Daily   carvedilol  3.125 mg  Oral BID WC   cholecalciferol  2,000 Units Oral Daily   citalopram  10 mg Oral Daily   cyanocobalamin  1,000 mcg Oral Daily   furosemide  40 mg Intravenous Q12H   insulin aspart  0-5 Units Subcutaneous QHS   insulin aspart  0-9 Units Subcutaneous TID WC   ipratropium-albuterol  3 mL Nebulization TID   montelukast  10 mg Oral QHS   pantoprazole  40 mg Oral BID AC   [START ON 01/05/2022] predniSONE  40 mg Oral Q breakfast   simvastatin  20 mg Oral q1800   warfarin  1 mg Oral ONCE-1600   Warfarin - Pharmacist Dosing Inpatient   Does not apply q1600    Continuous Infusions:  cefTRIAXone (ROCEPHIN)  IV 2 g (01/04/22 0851)   vancomycin 1,000 mg (01/04/22 0852)    PRN Medications:  acetaminophen, albuterol, dextromethorphan-guaiFENesin, docusate sodium,  hydrALAZINE, hydrOXYzine, ondansetron (ZOFRAN) IV, mouth rinse  Antimicrobials:  Anti-infectives (From admission, onward)    Start     Dose/Rate Route Frequency Ordered Stop   01/03/22 2200  vancomycin (VANCOCIN) IVPB 1000 mg/200 mL premix        1,000 mg 200 mL/hr over 60 Minutes Intravenous Every 12 hours 01/03/22 1420     01/03/22 1515  vancomycin (VANCOREADY) IVPB 500 mg/100 mL       See Hyperspace for full Linked Orders Report.   500 mg 100 mL/hr over 60 Minutes Intravenous  Once 01/03/22 1420 01/03/22 1850   01/03/22 1500  vancomycin (VANCOREADY) IVPB 2000 mg/400 mL       See Hyperspace for full Linked Orders Report.   2,000 mg 200 mL/hr over 120 Minutes Intravenous  Once 01/03/22 1420 01/03/22 1750   01/03/22 1045  cefTRIAXone (ROCEPHIN) 2 g in sodium chloride 0.9 % 100 mL IVPB        2 g 200 mL/hr over 30 Minutes Intravenous Every 24 hours 01/03/22 1040 01/10/22 1044       Data Reviewed: I have personally reviewed following labs and imaging studies  CBC: Recent Labs  Lab 01/03/22 0959 01/04/22 0615  WBC 19.2* 16.8*  NEUTROABS 17.4*  --   HGB 11.3* 10.2*  HCT 40.0 36.9*  MCV 78.6* 78.8*  PLT 213 294   Basic Metabolic Panel: Recent Labs  Lab 01/03/22 0959 01/04/22 0615  NA 137 137  K 3.2* 3.3*  CL 85* 87*  CO2 41* 40*  GLUCOSE 176* 225*  BUN 18 23  CREATININE 1.03 1.05  CALCIUM 9.4 9.0  MG 1.8 1.9   GFR: Estimated Creatinine Clearance: 96.6 mL/min (by C-G formula based on SCr of 1.05 mg/dL). Liver Function Tests: Recent Labs  Lab 01/03/22 0959  AST 34  ALT 17  ALKPHOS 71  BILITOT 1.2  PROT 7.2  ALBUMIN 3.9   No results for input(s): "LIPASE", "AMYLASE" in the last 168 hours. No results for input(s): "AMMONIA" in the last 168 hours. Coagulation Profile: Recent Labs  Lab 12/31/21 0000 01/03/22 0959 01/04/22 0615  INR 1.4* 1.7* 2.7*   Cardiac Enzymes: No results for input(s): "CKTOTAL", "CKMB", "CKMBINDEX", "TROPONINI" in the last 168  hours. BNP (last 3 results) Recent Labs    07/20/21 1630  PROBNP 153.0*   HbA1C: Recent Labs    01/03/22 1441  HGBA1C 8.6*   CBG: Recent Labs  Lab 01/03/22 1442 01/03/22 1649 01/03/22 2201 01/04/22 0817 01/04/22 1218  GLUCAP 173* 197* 245* 223* 343*   Lipid Profile: Recent Labs    01/04/22 0615  CHOL 104  HDL 42  LDLCALC 50  TRIG 59  CHOLHDL 2.5   Thyroid Function Tests: No results for input(s): "TSH", "T4TOTAL", "FREET4", "T3FREE", "THYROIDAB" in the last 72 hours. Anemia Panel: No results for input(s): "VITAMINB12", "FOLATE", "FERRITIN", "TIBC", "IRON", "RETICCTPCT" in the last 72 hours. Urine analysis:    Component Value Date/Time   COLORURINE YELLOW (A) 12/22/2018 1453   APPEARANCEUR CLEAR (A) 12/22/2018 1453   APPEARANCEUR Clear 02/19/2012 1128   LABSPEC 1.008 12/22/2018 1453   LABSPEC 1.010 02/19/2012 1128   PHURINE 8.0 12/22/2018 1453   GLUCOSEU NEGATIVE 12/22/2018 1453   GLUCOSEU Negative 02/19/2012 1128   HGBUR NEGATIVE 12/22/2018 1453   BILIRUBINUR NEGATIVE 12/22/2018 1453   BILIRUBINUR 1 mg/dL 07/26/2016 1624   BILIRUBINUR Negative 02/19/2012 1128   KETONESUR 5 (A) 12/22/2018 1453   PROTEINUR NEGATIVE 12/22/2018 1453   UROBILINOGEN 1.0 07/26/2016 1624   NITRITE NEGATIVE 12/22/2018 1453   LEUKOCYTESUR NEGATIVE 12/22/2018 1453   LEUKOCYTESUR Negative 02/19/2012 1128   Sepsis Labs: '@LABRCNTIP'$ (procalcitonin:4,lacticidven:4)  Recent Results (from the past 240 hour(s))  Resp Panel by RT-PCR (Flu A&B, Covid) Anterior Nasal Swab     Status: None   Collection Time: 01/03/22  9:59 AM   Specimen: Anterior Nasal Swab  Result Value Ref Range Status   SARS Coronavirus 2 by RT PCR NEGATIVE NEGATIVE Final    Comment: (NOTE) SARS-CoV-2 target nucleic acids are NOT DETECTED.  The SARS-CoV-2 RNA is generally detectable in upper respiratory specimens during the acute phase of infection. The lowest concentration of SARS-CoV-2 viral copies this assay can  detect is 138 copies/mL. A negative result does not preclude SARS-Cov-2 infection and should not be used as the sole basis for treatment or other patient management decisions. A negative result may occur with  improper specimen collection/handling, submission of specimen other than nasopharyngeal swab, presence of viral mutation(s) within the areas targeted by this assay, and inadequate number of viral copies(<138 copies/mL). A negative result must be combined with clinical observations, patient history, and epidemiological information. The expected result is Negative.  Fact Sheet for Patients:  EntrepreneurPulse.com.au  Fact Sheet for Healthcare Providers:  IncredibleEmployment.be  This test is no t yet approved or cleared by the [place] FDA and  has been authorized for detection and/or diagnosis of SARS-CoV-2 by FDA under an Emergency Use Authorization (EUA). This EUA will remain  in effect (meaning this test can be used) for the duration of the COVID-19 declaration under Section 564(b)(1) of the Act, 21 U.S.C.section 360bbb-3(b)(1), unless the authorization is terminated  or revoked sooner.       Influenza A by PCR NEGATIVE NEGATIVE Final   Influenza B by PCR NEGATIVE NEGATIVE Final    Comment: (NOTE) The Xpert Xpress SARS-CoV-2/FLU/RSV plus assay is intended as an aid in the diagnosis of influenza from Nasopharyngeal swab specimens and should not be used as a sole basis for treatment. Nasal washings and aspirates are unacceptable for Xpert Xpress SARS-CoV-2/FLU/RSV testing.  Fact Sheet for Patients: EntrepreneurPulse.com.au  Fact Sheet for Healthcare Providers: IncredibleEmployment.be  This test is not yet approved or cleared by the [place] FDA and has been authorized for detection and/or diagnosis of SARS-CoV-2 by FDA under an Emergency Use Authorization (EUA). This EUA will remain in  effect (meaning this test can be used) for the duration of the COVID-19 declaration under Section 564(b)(1) of the Act, 21 U.S.C. section 360bbb-3(b)(1), unless the authorization is terminated or revoked.  Performed at [facility], [address] [place], [place] [address]   Blood culture (routine x 2)     Status: None (Preliminary result)   Collection Time: 01/03/22 10:45 AM   Specimen: BLOOD  Result Value Ref Range Status   Specimen Description BLOOD RIGHT ANTECUBITAL  Final   Special Requests   Final    BOTTLES DRAWN AEROBIC AND ANAEROBIC Blood Culture results may not be optimal due to an inadequate volume of blood received in culture bottles   Culture   Final    NO GROWTH < 24 HOURS Performed at [facility], [address] [place], [place] [address]    Report Status PENDING  Incomplete  Blood culture (routine x 2)     Status: None (Preliminary result)   Collection Time: 01/03/22 10:45 AM   Specimen: BLOOD  Result Value Ref Range Status   Specimen Description BLOOD LEFT ANTECUBITAL  Final   Special Requests   Final    BOTTLES DRAWN AEROBIC AND ANAEROBIC Blood Culture adequate volume   Culture   Final    NO GROWTH < 24 HOURS Performed at [facility]: [address], [address] [place], [place] [address]    Report Status PENDING  Incomplete         Radiology Studies: US Venous Img Lower Bilateral (DVT)  Result Date: 01/03/2022 CLINICAL DATA:  Lower extremity pain and edema EXAM: BILATERAL LOWER EXTREMITY VENOUS DOPPLER ULTRASOUND TECHNIQUE: Gray-scale sonography with graded compression, as well as color Doppler and duplex ultrasound were performed to evaluate the lower extremity deep venous systems from the level of the common femoral vein and including the common femoral, femoral, profunda femoral, popliteal and calf veins including the posterior tibial, peroneal and gastrocnemius veins when visible. The superficial great saphenous vein was also  interrogated. Spectral Doppler was utilized to evaluate flow at rest and with distal augmentation maneuvers in the common femoral, femoral and popliteal veins. COMPARISON:  None Available. FINDINGS: RIGHT LOWER EXTREMITY Common Femoral Vein: No evidence of thrombus. Normal compressibility, respiratory phasicity and response to augmentation. Saphenofemoral Junction: No evidence of thrombus. Normal compressibility and flow on color Doppler imaging. Profunda Femoral Vein: No evidence of thrombus. Normal compressibility and flow on color Doppler imaging. Femoral Vein: No evidence of thrombus. Normal compressibility, respiratory phasicity and response to augmentation. Popliteal Vein: No evidence of thrombus. Normal compressibility, respiratory phasicity and response to augmentation. Calf Veins: No evidence of thrombus. Normal compressibility and flow on color Doppler imaging. Superficial Great Saphenous Vein: No evidence of thrombus. Normal compressibility. Venous Reflux:  None. Other Findings:  Lower leg subcutaneous edema. LEFT LOWER EXTREMITY Common Femoral Vein: No evidence of thrombus. Normal compressibility, respiratory phasicity and response to augmentation. Saphenofemoral Junction: No evidence of thrombus. Normal compressibility and flow on color Doppler imaging. Profunda Femoral Vein: No evidence of thrombus. Normal compressibility and flow on color Doppler imaging. Femoral Vein: No evidence of thrombus. Normal compressibility, respiratory phasicity and response to augmentation. Popliteal Vein: No evidence of thrombus. Normal compressibility, respiratory phasicity and response to augmentation. Calf Veins: No evidence of thrombus. Normal compressibility and flow on color Doppler imaging. Superficial Great Saphenous Vein: No evidence of thrombus. Normal compressibility. Venous Reflux:  None. Other Findings:  Lower leg subcutaneous edema. IMPRESSION: No evidence of deep venous thrombosis in either lower extremity.  Electronically Signed   By: Davina Poke D.O.   On: 01/03/2022 16:42   DG Chest Port 1 View  Result Date: 01/03/2022 CLINICAL DATA:  Shortness of breath with bilateral lower extremity weakness/swelling. Oxygen dependent.  EXAM: PORTABLE CHEST 1 VIEW COMPARISON:  12/28/2021 FINDINGS: Lungs are adequately inflated demonstrate stable minimal prominence of the central pulmonary vasculature which may represent mild vascular congestion. No focal airspace consolidation or effusion. Mild stable cardiomegaly. Remainder of the exam is unchanged. IMPRESSION: Mild stable cardiomegaly with suggestion of minimal vascular congestion. Electronically Signed   By: Marin Olp M.D.   On: 01/03/2022 10:10            LOS: 1 day      Emeterio Reeve, DO [facility] 01/04/2022, 1:48 PM   Staff may message me via secure chat in [place]  but this may not receive immediate response,  please page for urgent matters!  If 7PM-7AM, please contact night-coverage [email or web address]  Dictation software was used to generate the above note. Typos may occur and escape review, as with typed/written notes. Please contact Dr Sheppard Coil directly for clarity if needed.

## 2022-01-04 NOTE — Consult Note (Addendum)
ANTICOAGULATION CONSULT NOTE -   Pharmacy Consult for warfarin Indication: atrial fibrillation  Allergies  Allergen Reactions   Atorvastatin Other (See Comments)    Dizziness   Lisinopril Cough    Patient Measurements: Height: '5\' 11"'$  (180.3 cm) Weight: 133.7 kg (294 lb 12.1 oz) IBW/kg (Calculated) : 75.3   Vital Signs: Temp: 98.3 F (36.8 C) (09/11 0751) BP: 109/61 (09/11 0751) Pulse Rate: 70 (09/11 0751)  Labs: Recent Labs    01/03/22 0959 01/03/22 1158 01/04/22 0615  HGB 11.3*  --  10.2*  HCT 40.0  --  36.9*  PLT 213  --  189  LABPROT 19.5*  --  28.4*  INR 1.7*  --  2.7*  CREATININE 1.03  --  1.05  TROPONINIHS 28* 25*  --      Estimated Creatinine Clearance: 96.6 mL/min (by C-G formula based on SCr of 1.05 mg/dL).   Medical History: Past Medical History:  Diagnosis Date   Abnormal drug screen 2015   MJ positive x3 - one more and we will stop prescribing ativan (08/2013).   Anemia    Angina    Anxiety    Arthritis    CAD (coronary artery disease)    nonobstructive   Cannabis abuse    Chronic systolic CHF (congestive heart failure) ([place]) 2013   NYHA Class II/III   COPD (chronic obstructive pulmonary disease) (HCC)    Diabetes type 2, uncontrolled    declines DSME   Dilated cardiomyopathy ([place]) 2013   now improved   Ex-smoker    Frequent headaches    History of atrial fibrillation 2013   chronic, s/p ablation prior on coumadin   Hyperlipidemia    Migraine    Nonischemic cardiomyopathy ([place]) 2013   EF 25% per Dr Nehemiah Massed   Obesity    OSA (obstructive sleep apnea)    does not use CPAP - unable to tolerate   Seasonal allergies     Medications:  Scheduled:   acidophilus  1 capsule Oral Daily   amiodarone  200 mg Oral Daily   carvedilol  6.25 mg Oral BID WC   cholecalciferol  2,000 Units Oral Daily   citalopram  10 mg Oral Daily   cyanocobalamin  1,000 mcg Oral Daily   furosemide  40 mg Intravenous Q12H   insulin aspart  0-5 Units  Subcutaneous QHS   insulin aspart  0-9 Units Subcutaneous TID WC   ipratropium-albuterol  3 mL Nebulization QID   methylPREDNISolone (SOLU-MEDROL) injection  40 mg Intravenous Q12H   montelukast  10 mg Oral QHS   pantoprazole  40 mg Oral BID AC   simvastatin  20 mg Oral q1800   Warfarin - Pharmacist Dosing Inpatient   Does not apply q1600   Infusions:   cefTRIAXone (ROCEPHIN)  IV Stopped (01/03/22 1109)   vancomycin 1,000 mg (01/03/22 2220)   PRN: acetaminophen, albuterol, dextromethorphan-guaiFENesin, docusate sodium, hydrALAZINE, hydrOXYzine, ondansetron (ZOFRAN) IV, mouth rinse  Assessment: [name] is a 66 y.o. male presenting with worsening generalized weakness, shortness of breath, bilateral lower extremity edema over the past week. PMH significant for T2DM, CHF, CAD, COPD on 2L [place] chronically, Afib (on warfarin). Per recent CPP note on 12/31/2021, patient on warfarin 5 mg Mon/Thu and 2.5 mg Tue/Wed/Fri/Sat/Sun PTA for Afib (weekly dose 22.5 mg). Per med history, patient has not yet started the new schedule as described and was still taking warfarin 2.5 mg daily except Wed (weekly dose 15 mg). Last dose of warfarin 2.5 mg  PTA was taken 9/8. Pharmacy has been consulted to manage warfarin while in patient.  Baseline Labs: PT 19.5, INR 1.7, Hgb 11.3, Hct 40, Plt 213   9/10 INR 1.7 subtherapeutic  Warfarin 4 mg 9/11 INR 2.7 therapeutic 9/12   Goal of Therapy:  INR 2-3 Monitor platelets by anticoagulation protocol: Yes   Plan:  With jump in INR , will Give warfarin 1 mg x1 today as anticipate INR may slightly increase tomorrow Monitor INR daily and CBC at least once weekly while on warfarin DDI: Amiodarone (on both PTA), steroid noted, Vancomycin/Ceftriaxone  Thank you for allowing pharmacy to be a part of this patient's care.  Chinita Greenland PharmD Clinical Pharmacist 01/04/2022

## 2022-01-04 NOTE — Plan of Care (Signed)
  Problem: Education: Goal: Ability to describe self-care measures that may prevent or decrease complications (Diabetes Survival Skills Education) will improve Outcome: Progressing Goal: Individualized Educational Video(s) Outcome: Progressing   Problem: Coping: Goal: Ability to adjust to condition or change in health will improve Outcome: Progressing   Problem: Fluid Volume: Goal: Ability to maintain a balanced intake and output will improve Outcome: Progressing   Problem: Health Behavior/Discharge Planning: Goal: Ability to identify and utilize available resources and services will improve Outcome: Progressing Goal: Ability to manage health-related needs will improve Outcome: Progressing   Problem: Metabolic: Goal: Ability to maintain appropriate glucose levels will improve Outcome: Progressing   Problem: Nutritional: Goal: Maintenance of adequate nutrition will improve Outcome: Progressing Goal: Progress toward achieving an optimal weight will improve Outcome: Progressing   Problem: Skin Integrity: Goal: Risk for impaired skin integrity will decrease Outcome: Progressing   Problem: Tissue Perfusion: Goal: Adequacy of tissue perfusion will improve Outcome: Progressing   Problem: Education: Goal: Knowledge of General Education information will improve Description: Including pain rating scale, medication(s)/side effects and non-pharmacologic comfort measures Outcome: Progressing   Problem: Health Behavior/Discharge Planning: Goal: Ability to manage health-related needs will improve Outcome: Progressing   Problem: Clinical Measurements: Goal: Ability to maintain clinical measurements within normal limits will improve Outcome: Progressing Goal: Will remain free from infection Outcome: Progressing Goal: Diagnostic test results will improve Outcome: Progressing Goal: Respiratory complications will improve Outcome: Progressing Goal: Cardiovascular complication will  be avoided Outcome: Progressing   Problem: Activity: Goal: Risk for activity intolerance will decrease Outcome: Progressing   Problem: Nutrition: Goal: Adequate nutrition will be maintained Outcome: Progressing   Problem: Coping: Goal: Level of anxiety will decrease Outcome: Progressing   Problem: Elimination: Goal: Will not experience complications related to bowel motility Outcome: Progressing Goal: Will not experience complications related to urinary retention Outcome: Progressing   Problem: Pain Managment: Goal: General experience of comfort will improve Outcome: Progressing   Problem: Safety: Goal: Ability to remain free from injury will improve Outcome: Progressing   Problem: Skin Integrity: Goal: Risk for impaired skin integrity will decrease Outcome: Progressing   Problem: Education: Goal: Ability to demonstrate management of disease process will improve Outcome: Progressing Goal: Ability to verbalize understanding of medication therapies will improve Outcome: Progressing Goal: Individualized Educational Video(s) Outcome: Progressing   Problem: Activity: Goal: Capacity to carry out activities will improve Outcome: Progressing   Problem: Cardiac: Goal: Ability to achieve and maintain adequate cardiopulmonary perfusion will improve Outcome: Progressing   Problem: Education: Goal: Knowledge of disease or condition will improve Outcome: Progressing Goal: Knowledge of the prescribed therapeutic regimen will improve Outcome: Progressing Goal: Individualized Educational Video(s) Outcome: Progressing   Problem: Activity: Goal: Ability to tolerate increased activity will improve Outcome: Progressing Goal: Will verbalize the importance of balancing activity with adequate rest periods Outcome: Progressing   Problem: Respiratory: Goal: Ability to maintain a clear airway will improve Outcome: Progressing Goal: Levels of oxygenation will improve Outcome:  Progressing Goal: Ability to maintain adequate ventilation will improve Outcome: Progressing   Problem: Clinical Measurements: Goal: Ability to avoid or minimize complications of infection will improve Outcome: Progressing   Problem: Skin Integrity: Goal: Skin integrity will improve Outcome: Progressing

## 2022-01-04 NOTE — Evaluation (Signed)
Physical Therapy Evaluation Patient Details Name: [name] MRN: [id number] DOB: February 16, 1956 Today's Date: 01/04/2022  History of Present Illness  Pt is a 66 y.o. male with medical history significant of COPD on 2 L oxygen at home, hyperlipidemia, diabetes mellitus, GERD, depression, OSA (uses CPAP not consistently), morbid obesity with a BMI of 42.89, atrial fibrillation on Coumadin, dCHF, CAD, anemia, marijuana use, who presents with leg pain 01/03/2022 from home via EMS.   Clinical Impression  Patient alert, agreeable to PT, reported 2/10 BLE pain, that increased to 4/10 with activity. Pt reported until recently he has been independent, drives, etc. Due to pain in the last couple of days has been using a walking stick and has not been driving. Did report 5 falls in the last 6 months.  The patient was in the recliner at start/end of the session. He was able to move all extremities against gravity. Sit <> stand with RW and supervision, cued for hand placement to maximize safety. He did ambulate ~13f with RW and CGA, no LOB but did report fatigue.  Overall the patient demonstrated deficits (see "PT Problem List") that impede the patient's functional abilities, safety, and mobility and would benefit from skilled PT intervention. Recommendation at this time is HHPT with intermittent supervision to return to PLOF.      Recommendations for follow up therapy are one component of a multi-disciplinary discharge planning process, led by the attending physician.  Recommendations may be updated based on patient status, additional functional criteria and insurance authorization.  Follow Up Recommendations Home health PT      Assistance Recommended at Discharge Intermittent Supervision/Assistance  Patient can return home with the following  Assist for transportation;Assistance with cooking/housework    Equipment Recommendations Rolling walker (2 wheels)  Recommendations for Other Services        Functional Status Assessment Patient has had a recent decline in their functional status and demonstrates the ability to make significant improvements in function in a reasonable and predictable amount of time.     Precautions / Restrictions Precautions Precautions: Fall Restrictions Weight Bearing Restrictions: No      Mobility  Bed Mobility               General bed mobility comments: pt up in recliner at start/end of session    Transfers Overall transfer level: Needs assistance Equipment used: Rolling walker (2 wheels) Transfers: Sit to/from Stand Sit to Stand: Supervision                Ambulation/Gait Ambulation/Gait assistance: Min guard Gait Distance (Feet): 150 Feet Assistive device: Rolling walker (2 wheels)         General Gait Details: decreased velocity, pt reported fatigue. no LOB, step length even  Stairs            Wheelchair Mobility    Modified Rankin (Stroke Patients Only)       Balance Overall balance assessment: Needs assistance Sitting-balance support: Feet supported Sitting balance-Leahy Scale: Good       Standing balance-Leahy Scale: Good                               Pertinent Vitals/Pain Pain Assessment Pain Assessment: 0-10 Pain Score: 4  Pain Location: bilateral legs, pain in calves today Pain Descriptors / Indicators: Aching, Sore Pain Intervention(s): Limited activity within patient's tolerance, Monitored during session, Repositioned    Home Living Family/patient expects to be discharged  to:: Private residence Living Arrangements: Alone Available Help at Discharge: Family;Friend(s);Neighbor Type of Home: House Home Access: [place]: One [place]: Grab bars - toilet;Grab bars - tub/shower (walking stick)      Prior Function Prior Level of Function : Independent/Modified Independent;Driving                     Hand Dominance         Extremity/Trunk Assessment   Upper Extremity Assessment Upper Extremity Assessment: Overall WFL for tasks assessed    Lower Extremity Assessment Lower Extremity Assessment: Generalized weakness    Cervical / Trunk Assessment Cervical / Trunk Assessment: Normal  Communication   Communication: No difficulties  Cognition Arousal/Alertness: Awake/alert Behavior During Therapy: WFL for tasks assessed/performed Overall Cognitive Status: Within Functional Limits for tasks assessed                                          General Comments      Exercises     Assessment/Plan    PT Assessment Patient needs continued PT services  PT Problem List Decreased strength;Decreased mobility;Decreased range of motion;Decreased activity tolerance;Decreased balance;Pain;Decreased knowledge of precautions;Decreased knowledge of use of DME       PT Treatment Interventions DME instruction;Therapeutic exercise;Gait training;Balance training;Stair training;Neuromuscular re-education;Functional mobility training;Therapeutic activities;Patient/family education    PT Goals (Current goals can be found in the Care Plan section)  Acute Rehab PT Goals Patient Stated Goal: to get back to PLOF PT Goal Formulation: With patient Time For Goal Achievement: 01/18/22 Potential to Achieve Goals: Good    Frequency Min 2X/week     Co-evaluation               AM-PAC PT "6 Clicks" Mobility  Outcome Measure Help needed turning from your back to your side while in a flat bed without using bedrails?: A Little Help needed moving from lying on your back to sitting on the side of a flat bed without using bedrails?: A Little Help needed moving to and from a bed to a chair (including a wheelchair)?: A Little Help needed standing up from a chair using your arms (e.g., wheelchair or bedside chair)?: A Little Help needed to walk in hospital room?: A Little Help needed climbing 3-5 steps with a  railing? : A Little 6 Click Score: 18    End of Session   Activity Tolerance: Patient tolerated treatment well Patient left: in chair;with call bell/phone within reach Nurse Communication: Mobility status PT Visit Diagnosis: Other abnormalities of gait and mobility (R26.89);Difficulty in walking, not elsewhere classified (R26.2);Muscle weakness (generalized) (M62.81)    Time: 8127-5170 PT Time Calculation (min) (ACUTE ONLY): 24 min   Charges:   PT Evaluation $PT Eval Low Complexity: 1 Low PT Treatments $Therapeutic Activity: 8-22 mins        Lieutenant Diego PT, DPT 3:54 PM,01/04/22

## 2022-01-04 NOTE — Progress Notes (Signed)
Visited patient to discuss heart failure education and give patient information on his new patient appointment with the heart failure clinic on 01/15/22.  Patient given education packet that includes "Living Better with Heart Failure" booklet. Reviewed with patient information about the clinic and what to expect at his first appointment. Reviewed with patient the importance of adhering to low sodium diet, daily weight checks, and medication compliance.  Patient states he is already following a low sodium diet and does not add any salt to his food. He states he is already weighing himself regularly and has a working scale.  Reviewed with patient the signs and symptoms that should prompt a call to the heart failure clinic for further follow up and treatment, such as worsening shortness of breath, swelling, and weight gain parameters. Patient verbalized understanding.  Patient states he still drives himself, and says he has transportation. He states he will plan to be at his appointment and had no further questions at this time. Georg Ruddle, RN

## 2022-01-04 NOTE — Inpatient Diabetes Management (Signed)
Inpatient Diabetes Program Recommendations  AACE/ADA: New Consensus Statement on Inpatient Glycemic Control   Target Ranges:  Prepandial:   less than 140 mg/dL      Peak postprandial:   less than 180 mg/dL (1-2 hours)      Critically ill patients:  140 - 180 mg/dL    Latest Reference Range & Units 01/04/22 08:17 01/04/22 12:18  Glucose-Capillary 70 - 99 mg/dL 223 (H) 343 (H)   Review of Glycemic Control  Diabetes history: DM2 Outpatient Diabetes medications: Amaryl 4 mg QAM, Metformin 1000 mg BID Current orders for Inpatient glycemic control: Novolog 0-9 units TID with meals, Novoloog 0-5 units QHS  Inpatient Diabetes Program Recommendations:    Insulin: If steroids are continued, please consider ordering Novolog 3 units TID with meals for meal coverage if patient eats at least 50% of meals.  Thanks, Barnie Alderman, RN, MSN, [facility] Coordinator Inpatient Diabetes Program [phone] (Team Pager from 8am to [place])

## 2022-01-05 ENCOUNTER — Telehealth: Payer: Self-pay

## 2022-01-05 ENCOUNTER — Ambulatory Visit: Payer: Medicare Other | Admitting: Family Medicine

## 2022-01-05 DIAGNOSIS — I5033 Acute on chronic diastolic (congestive) heart failure: Secondary | ICD-10-CM | POA: Diagnosis not present

## 2022-01-05 LAB — URINALYSIS, ROUTINE W REFLEX MICROSCOPIC
Bacteria, UA: NONE SEEN
Bilirubin Urine: NEGATIVE
Glucose, UA: >=500 mg/dL — AB
Hgb urine dipstick: NEGATIVE
Ketones, ur: NEGATIVE mg/dL
Leukocytes,Ua: NEGATIVE
Nitrite: NEGATIVE
Protein, ur: 30 mg/dL — AB
Specific Gravity, Urine: 1.023 (ref 1.005–1.030)
pH: 6 (ref 5.0–8.0)

## 2022-01-05 LAB — BASIC METABOLIC PANEL
Anion gap: 12 (ref 5–15)
BUN: 28 mg/dL — ABNORMAL HIGH (ref 8–23)
CO2: 38 mmol/L — ABNORMAL HIGH (ref 22–32)
Calcium: 9.2 mg/dL (ref 8.9–10.3)
Chloride: 86 mmol/L — ABNORMAL LOW (ref 98–111)
Creatinine, Ser: 0.81 mg/dL (ref 0.61–1.24)
GFR, Estimated: >60 mL/min (ref >60)
Glucose, Bld: 194 mg/dL — ABNORMAL HIGH (ref 70–99)
Potassium: 3.3 mmol/L — ABNORMAL LOW (ref 3.5–5.1)
Sodium: 136 mmol/L (ref 135–145)

## 2022-01-05 LAB — CBC
HCT: 35.6 % — ABNORMAL LOW (ref 39.0–52.0)
Hemoglobin: 10 g/dL — ABNORMAL LOW (ref 13.0–17.0)
MCH: 22.1 pg — ABNORMAL LOW (ref 26.0–34.0)
MCHC: 28.1 g/dL — ABNORMAL LOW (ref 30.0–36.0)
MCV: 78.8 fL — ABNORMAL LOW (ref 80.0–100.0)
Platelets: 190 10*3/uL (ref 150–400)
RBC: 4.52 MIL/uL (ref 4.22–5.81)
RDW: 21.2 % — ABNORMAL HIGH (ref 11.5–15.5)
WBC: 20.5 10*3/uL — ABNORMAL HIGH (ref 4.0–10.5)
nRBC: 0 % (ref 0.0–0.2)

## 2022-01-05 LAB — GLUCOSE, CAPILLARY: Glucose-Capillary: 313 mg/dL — ABNORMAL HIGH (ref 70–99)

## 2022-01-05 LAB — HIV ANTIBODY (ROUTINE TESTING W REFLEX): HIV Screen 4th Generation wRfx: NONREACTIVE

## 2022-01-05 LAB — PROTIME-INR
INR: 2 — ABNORMAL HIGH (ref 0.8–1.2)
Prothrombin Time: 22.8 seconds — ABNORMAL HIGH (ref 11.4–15.2)

## 2022-01-05 MED ORDER — POTASSIUM CHLORIDE CRYS ER 20 MEQ PO TBCR
40.0000 meq | EXTENDED_RELEASE_TABLET | Freq: Once | ORAL | Status: AC
  Administered 2022-01-05: 40 meq via ORAL
  Filled 2022-01-05: qty 2

## 2022-01-05 MED ORDER — IPRATROPIUM-ALBUTEROL 0.5-2.5 (3) MG/3ML IN SOLN: 3.0000 mL | Freq: Four times a day (QID) | RESPIRATORY_TRACT | 0 refills | Status: DC | PRN

## 2022-01-05 MED ORDER — SULFAMETHOXAZOLE-TRIMETHOPRIM 800-160 MG PO TABS: 1.0000 | ORAL_TABLET | Freq: Two times a day (BID) | ORAL | 0 refills | Status: AC

## 2022-01-05 MED ORDER — VANCOMYCIN HCL 1250 MG/250ML IV SOLN
1250.0000 mg | Freq: Two times a day (BID) | INTRAVENOUS | Status: DC
  Administered 2022-01-05: 1250 mg via INTRAVENOUS
  Filled 2022-01-05: qty 250

## 2022-01-05 MED ORDER — WARFARIN SODIUM 2.5 MG PO TABS
2.5000 mg | ORAL_TABLET | Freq: Once | ORAL | Status: DC
  Filled 2022-01-05: qty 1

## 2022-01-05 MED ORDER — POTASSIUM CHLORIDE CRYS ER 20 MEQ PO TBCR: 20.0000 meq | EXTENDED_RELEASE_TABLET | Freq: Every day | ORAL | 0 refills | Status: DC

## 2022-01-05 MED ORDER — PREDNISONE 20 MG PO TABS: 40.0000 mg | ORAL_TABLET | Freq: Every day | ORAL | 0 refills | Status: DC

## 2022-01-05 MED ORDER — CARVEDILOL 3.125 MG PO TABS: 3.1250 mg | ORAL_TABLET | Freq: Two times a day (BID) | ORAL | 0 refills | Status: DC

## 2022-01-05 NOTE — Discharge Summary (Addendum)
Physician Discharge Summary   Patient: [name] MRN: [id number]  DOB: 03-04-1956   Admit:     Date of Admission: 01/03/2022 Admitted from: home   Discharge: Date of discharge: 01/05/22 Disposition: Home health Condition at discharge: good  CODE STATUS: FULL     Discharge Physician: Emeterio Reeve, DO [facility]     PCP: [name], MD  Recommendations for Outpatient Follow-up:  Follow up with PCP [name], MD in 1-2 weeks Please obtain labs/tests: CBC, CMP in 1-2 weeks - follow renal fxn and potassium  Please follow up on the following pending results: none PCP AND OTHER OUTPATIENT PROVIDERS: SEE BELOW FOR SPECIFIC DISCHARGE INSTRUCTIONS PRINTED FOR PATIENT IN ADDITION TO GENERIC AVS PATIENT INFO    Discharge Instructions     Diet - low sodium heart healthy   Complete by: As directed    Increase activity slowly   Complete by: As directed          Discharge Diagnoses: Principal Problem:   Acute on chronic diastolic CHF (congestive heart failure) ([place]) Active Problems:   COPD exacerbation ([facility])   Sepsis ([place])   Cellulitis of both lower extremities   Coronary artery disease   Myocardial infarct ([place])   History of atrial fibrillation   Hyperlipidemia   Hypertension   Type 2 diabetes mellitus with other specified complication ([place])   Hypokalemia   Depression with anxiety   Obesity, morbid, BMI 40.0-49.9 [facility])       Hospital Course: JCEON ALVERIO is a 66 y.o. male with medical history significant of COPD on 2 L oxygen at home, hyperlipidemia, diabetes mellitus, GERD, depression, OSA (uses CPAP not consistently), morbid obesity with a BMI of 42.89, atrial fibrillation on Coumadin, dCHF, CAD, anemia, marijuana use, who presents with leg pain 01/03/2022 from home via EMS. Patient states that he has SOB and  chronic lower extremity edema which has worsened in the past 4 weeks.  The left leg is worse than the right.   Recent admission 12/28/2021 to 09/10: ED course and early admission-Per EMS, SPO2 60% room air (not wearing his usual O2), arrived on 8 L NRB SPO2 100%.  WBC 19.2, troponin 28, BNP 289, lactic acid 1.6, temperature 99, RR 22, CXR cardiomegaly and vascular congestion.  Admitted for acute on chronic diastolic CHF, COPD exacerbation, sepsis due to cellulitis bilateral lower extremities with left worse than right.  Started on diuresis, fluid restriction, bronchodilators, steroids, vancomycin plus ceftriaxone.  Blood cultures pending.  Did not receive IV fluids due to CHF exacerbation and given normal lactic acid.  LE venous Dopplers to evaluate for DVT negative.  Troponins trended and flat - no ACS.  09/11: WBC trending down to 16.8.  Other than hypoxia, VSS.  No longer meeting criteria for sepsis. Reports pain in L leg is improved. Plan additional day IV abx and await BCx. Does not appear seriously fluid overloaded at this time.     Consultants:  none  Procedures: none  Antimicrobials: Ceftriaxone 2 g IV every 24 hours, today will be day 2 Vancomycin 1000 mg IV every 12 hours, today will be day 2     ASSESSMENT & PLAN:   Principal Problem:   Acute on chronic diastolic CHF (congestive heart failure) ([facility]) Active Problems:   COPD exacerbation ([facility])   Sepsis ([facility])   Cellulitis of both lower extremities   Coronary artery disease   Myocardial infarct [facility])   History of atrial fibrillation   Hyperlipidemia  Hypertension   Type 2 diabetes mellitus with other specified complication ([facility])   Hypokalemia   Depression with anxiety   Obesity, morbid, BMI 40.0-49.9 ([facility])   Acute on chronic diastolic CHF (congestive heart failure) ([place]) 2D echo on 11/19/2021 showed EF of 60 to 65% with grade 2 diastolic dysfunction. Defer echocardiogram for now given that he has had one recently, will obtain echo if not improving/if worse Lasix 40 mg bid by IV --> po on discharge  Daily weights Low salt  diet Fluid restriction  COPD exacerbation ([facility]) Patient has wheezing, shortness breath, clinically consistent with COPD exacerbation Bronchodilators Solu-Medrol 40 mg IV bid --> prednisone 40 mg daily  Mucinex for cough  Incentive spirometry sputum culture Nasal cannula oxygen as needed to maintain O2 saturation 93% or greater Has significantly improved here  Sepsis ([place]) - sepsis resolved as of a.m. 01/04/2022 Cellulitis of both lower extremities Sepsis due to cellulitis of lower extremities (left leg is worse than the right): Patient meets criteria for sepsis with WBC 19.2, RR 22.  Lactic acid is normal.  CRP elevated at 29.2. No DVT Empiric antimicrobial treatment with vancomycin and Rocephin --> bactrim on discharge  Blood cultures x 2 - NG IVF: Will not give IV fluid due to CHF exacerbation in the normal lactic acid level, but patient appears improving without this treatment   Coronary artery disease History Myocardial infarct (HCC) History of CAD and myocardial injury due to multiple acute issues  ACS ruled out Troponin level 28 --> 25.  No chest pain LDL at goal at 50 A1c not at goal, at 8.6 Will not start aspirin since patient is on Coumadin Continue home Zocor Treat diabetes as below --> outpatient follow-up  Reduced carvedilol given occasional soft BP and pt reports occasional dizziness   History of atrial fibrillation Heart rate 79 Continue Coumadin per pharmacy Coreg Amiodarone  Hyperlipidemia Zocor  Hypertension Coreg - reduced as above  Per guidelines, should consider ACE/ARB, consider adding this if BP tolerates / defer to outpatient   Type 2 diabetes mellitus with other specified complication (HCC) Recent A1c 8.7, poorly controlled.   Patient is taking Amaryl, metformin, Farxiga Sliding scale insulin in patient   Hypokalemia Potassium 3.2 on admission, magnesium 1.8 Repleted potassium --> po supplementation outpatient  Trend BMP --> follow  outpatient   Depression with anxiety Continue home medication  Obesity, morbid, BMI 40.0-49.9 (HCC) BMI= 42.89  and BW= 139.5 Diet and exercise.   Encourage to lose weight.              Discharge Instructions  Allergies as of 01/05/2022       Reactions   Atorvastatin Other (See Comments)   Dizziness   Lisinopril Cough        Medication List     STOP taking these medications    torsemide 10 MG tablet Commonly known as: DEMADEX   triamcinolone cream 0.1 % Commonly known as: KENALOG       TAKE these medications    Accu-Chek FastClix Lancets Misc Check blood sugar once daily and as instructed. Dx 250.00   albuterol 108 (90 Base) MCG/ACT inhaler Commonly known as: VENTOLIN HFA INHALE 2 PUFFS BY MOUTH EVERY 6 HOURS AS NEEDED FOR WHEEZE OR SHORTNESS OF BREATH What changed: Another medication with the same name was removed. Continue taking this medication, and follow the directions you see here.   amiodarone 200 MG tablet Commonly known as: PACERONE Take 1 tablet (200 mg total) by mouth  daily.   Blood Glucose Monitor System w/Device Kit by Does not apply route. Use to check sugar once daily and as needed Dx: E11.9 **ONE TOUCH VERIO**   Breztri Aerosphere 160-9-4.8 MCG/ACT Aero Generic drug: Budeson-Glycopyrrol-Formoterol Inhale 2 puffs into the lungs 2 (two) times daily.   carvedilol 3.125 MG tablet Commonly known as: COREG Take 1 tablet (3.125 mg total) by mouth 2 (two) times daily with a meal. What changed:  medication strength how much to take   citalopram 10 MG tablet Commonly known as: CELEXA Take 1 tablet (10 mg total) by mouth daily.   cyanocobalamin 1000 MCG tablet Commonly known as: VITAMIN B12 Take 1 tablet (1,000 mcg total) by mouth every Monday, Wednesday, and Friday. What changed: when to take this   dapagliflozin propanediol 5 MG Tabs tablet Commonly known as: Farxiga Take 1 tablet (5 mg total) by mouth daily before  breakfast.   fluticasone 50 MCG/ACT nasal spray Commonly known as: FLONASE SPRAY 2 SPRAYS INTO EACH NOSTRIL EVERY DAY What changed: See the new instructions.   furosemide 80 MG tablet Commonly known as: LASIX Take 80 mg by mouth 2 (two) times daily.   glimepiride 4 MG tablet Commonly known as: AMARYL TAKE 1 TABLET BY MOUTH EVERY DAY WITH BREAKFAST   hydrOXYzine 50 MG tablet Commonly known as: ATARAX TAKE 1 TABLET (50 MG TOTAL) BY MOUTH 2 (TWO) TIMES DAILY AS NEEDED FOR ANXIETY.   ipratropium 17 MCG/ACT inhaler Commonly known as: ATROVENT HFA Inhale 2 puffs into the lungs every 6 (six) hours as needed for wheezing.   ipratropium-albuterol 0.5-2.5 (3) MG/3ML Soln Commonly known as: DUONEB Take 3 mLs by nebulization every 6 (six) hours as needed (severe shortness of breath/wheezing).   metFORMIN 1000 MG tablet Commonly known as: GLUCOPHAGE TAKE 1 TABLET (1,000 MG TOTAL) BY MOUTH 2 (TWO) TIMES DAILY WITH A MEAL.   metolazone 2.5 MG tablet Commonly known as: ZAROXOLYN Take 1 tablet (2.5 mg total) by mouth 2 (two) times a week. What changed: additional instructions   montelukast 10 MG tablet Commonly known as: SINGULAIR TAKE 1 TABLET BY MOUTH EVERYDAY AT BEDTIME   NON FORMULARY 1,200 mg daily. SEA MOSS   NON FORMULARY daily. SUPER BEETS   NON FORMULARY 500 mg once. TURMERIC   OneTouch Verio test strip Generic drug: glucose blood Check blood sugar 3 times a day   pantoprazole 40 MG tablet Commonly known as: PROTONIX TAKE 1 TABLET (40 MG TOTAL) BY MOUTH 2 (TWO) TIMES DAILY BEFORE A MEAL.   potassium chloride SA 20 MEQ tablet Commonly known as: KLOR-CON M Take 1 tablet (20 mEq total) by mouth daily for 7 days.   predniSONE 20 MG tablet Commonly known as: DELTASONE Take 2 tablets (40 mg total) by mouth daily with breakfast. Start taking on: January 06, 2022   PROBIOTIC DAILY PO Take 1 tablet by mouth daily.   simvastatin 20 MG tablet Commonly known as:  ZOCOR TAKE 1 TABLET BY MOUTH EVERY DAY IN THE EVENING   STOOL SOFTENER PO Take 1 tablet by mouth daily.   sulfamethoxazole-trimethoprim 800-160 MG tablet Commonly known as: BACTRIM DS Take 1 tablet by mouth 2 (two) times daily for 7 days.   Vitamin D 50 MCG (2000 UT) Caps Take 1 capsule (2,000 Units total) by mouth daily.   warfarin 5 MG tablet Commonly known as: COUMADIN Take as directed. If you are unsure how to take this medication, talk to your nurse or doctor. Original instructions: TAKE 1/2 TABLET  BY MOUTH DAILY EXCEPT TAKE NOTHING ON WEDNESDAYS OR AS DIRECTED BY ANTICOAGULATION CLINIC               Durable Medical Equipment  (From admission, onward)           Start     Ordered   01/05/22 0929  For home use only DME Walker rolling  Once       Question Answer Comment  Walker: With 5 Inch Wheels   Patient needs a walker to treat with the following condition Impaired mobility      01/05/22 0928              Allergies  Allergen Reactions   Atorvastatin Other (See Comments)    Dizziness   Lisinopril Cough     Subjective: pt reports no SOB, no CP, pain is much better, no concerns for going home    Discharge Exam: BP (!) 142/85 (BP Location: Right Arm)   Pulse 66   Temp 98.2 F (36.8 C)   Resp 17   Ht '5\' 11"'  (1.803 m)   Wt (!) 138 kg   SpO2 94%   BMI 42.43 kg/m  General: Pt is alert, awake, not in acute distress Cardiovascular: reg rate, irreg rhythm, S1/S2 +, no rubs, no gallops Respiratory: CTA bilaterally w/ diminished breath sounds, no wheezing, no rhonchi, no rales  Abdominal: Soft, NT, ND, bowel sounds + Extremities: no edema, no cyanosis     The results of significant diagnostics from this hospitalization (including imaging, microbiology, ancillary and laboratory) are listed below for reference.     Microbiology: Recent Results (from the past 240 hour(s))  Resp Panel by RT-PCR (Flu A&B, Covid) Anterior Nasal Swab     Status:  None   Collection Time: 01/03/22  9:59 AM   Specimen: Anterior Nasal Swab  Result Value Ref Range Status   SARS Coronavirus 2 by RT PCR NEGATIVE NEGATIVE Final    Comment: (NOTE) SARS-CoV-2 target nucleic acids are NOT DETECTED.  The SARS-CoV-2 RNA is generally detectable in upper respiratory specimens during the acute phase of infection. The lowest concentration of SARS-CoV-2 viral copies this assay can detect is 138 copies/mL. A negative result does not preclude SARS-Cov-2 infection and should not be used as the sole basis for treatment or other patient management decisions. A negative result may occur with  improper specimen collection/handling, submission of specimen other than nasopharyngeal swab, presence of viral mutation(s) within the areas targeted by this assay, and inadequate number of viral copies(<138 copies/mL). A negative result must be combined with clinical observations, patient history, and epidemiological information. The expected result is Negative.  Fact Sheet for Patients:  EntrepreneurPulse.com.au  Fact Sheet for Healthcare Providers:  IncredibleEmployment.be  This test is no t yet approved or cleared by the [place] FDA and  has been authorized for detection and/or diagnosis of SARS-CoV-2 by FDA under an Emergency Use Authorization (EUA). This EUA will remain  in effect (meaning this test can be used) for the duration of the COVID-19 declaration under Section 564(b)(1) of the Act, 21 U.S.C.section 360bbb-3(b)(1), unless the authorization is terminated  or revoked sooner.       Influenza A by PCR NEGATIVE NEGATIVE Final   Influenza B by PCR NEGATIVE NEGATIVE Final    Comment: (NOTE) The Xpert Xpress SARS-CoV-2/FLU/RSV plus assay is intended as an aid in the diagnosis of influenza from Nasopharyngeal swab specimens and should not be used as a sole basis for treatment. Nasal washings  and aspirates are unacceptable  for Xpert Xpress SARS-CoV-2/FLU/RSV testing.  Fact Sheet for Patients: EntrepreneurPulse.com.au  Fact Sheet for Healthcare Providers: IncredibleEmployment.be  This test is not yet approved or cleared by the [place] FDA and has been authorized for detection and/or diagnosis of SARS-CoV-2 by FDA under an Emergency Use Authorization (EUA). This EUA will remain in effect (meaning this test can be used) for the duration of the COVID-19 declaration under Section 564(b)(1) of the Act, 21 U.S.C. section 360bbb-3(b)(1), unless the authorization is terminated or revoked.  Performed at [facility], [address] [place], [place] [address]   Blood culture (routine x 2)     Status: None (Preliminary result)   Collection Time: 01/03/22 10:45 AM   Specimen: BLOOD  Result Value Ref Range Status   Specimen Description BLOOD RIGHT ANTECUBITAL  Final   Special Requests   Final    BOTTLES DRAWN AEROBIC AND ANAEROBIC Blood Culture results may not be optimal due to an inadequate volume of blood received in culture bottles   Culture   Final    NO GROWTH 2 DAYS Performed at [facility], [address] [place], [place] [address]    Report Status PENDING  Incomplete  Blood culture (routine x 2)     Status: None (Preliminary result)   Collection Time: 01/03/22 10:45 AM   Specimen: BLOOD  Result Value Ref Range Status   Specimen Description BLOOD LEFT ANTECUBITAL  Final   Special Requests   Final    BOTTLES DRAWN AEROBIC AND ANAEROBIC Blood Culture adequate volume   Culture   Final    NO GROWTH 2 DAYS Performed at [facility], [address] [place], [place] [address]    Report Status PENDING  Incomplete     Labs: BNP (last 3 results) Recent Labs    04/03/21 1510 01/03/22 0959  BNP 54 680.8*   Basic Metabolic Panel: Recent Labs  Lab 01/03/22 0959 01/04/22 0615 01/05/22 0534  NA 137 137 136  K 3.2* 3.3* 3.3*   CL 85* 87* 86*  CO2 41* 40* 38*  GLUCOSE 176* 225* 194*  BUN 18 23 28*  CREATININE 1.03 1.05 0.81  CALCIUM 9.4 9.0 9.2  MG 1.8 1.9  --    Liver Function Tests: Recent Labs  Lab 01/03/22 0959  AST 34  ALT 17  ALKPHOS 71  BILITOT 1.2  PROT 7.2  ALBUMIN 3.9   No results for input(s): "LIPASE", "AMYLASE" in the last 168 hours. No results for input(s): "AMMONIA" in the last 168 hours. CBC: Recent Labs  Lab 01/03/22 0959 01/04/22 0615 01/05/22 0534  WBC 19.2* 16.8* 20.5*  NEUTROABS 17.4*  --   --   HGB 11.3* 10.2* 10.0*  HCT 40.0 36.9* 35.6*  MCV 78.6* 78.8* 78.8*  PLT 213 189 190   Cardiac Enzymes: No results for input(s): "CKTOTAL", "CKMB", "CKMBINDEX", "TROPONINI" in the last 168 hours. BNP: Invalid input(s): "POCBNP" CBG: Recent Labs  Lab 01/04/22 0817 01/04/22 1218 01/04/22 1702 01/04/22 2206 01/05/22 1154  GLUCAP 223* 343* 277* 250* 313*   D-Dimer No results for input(s): "DDIMER" in the last 72 hours. Hgb A1c Recent Labs    01/03/22 1441  HGBA1C 8.6*   Lipid Profile Recent Labs    01/04/22 0615  CHOL 104  HDL 42  LDLCALC 50  TRIG 59  CHOLHDL 2.5   Thyroid function studies No results for input(s): "TSH", "T4TOTAL", "T3FREE", "THYROIDAB" in the last 72 hours.  Invalid input(s): "  FREET3" Anemia work up No results for input(s): "VITAMINB12", "FOLATE", "FERRITIN", "TIBC", "IRON", "RETICCTPCT" in the last 72 hours. Urinalysis    Component Value Date/Time   COLORURINE YELLOW (A) 01/05/2022 0244   APPEARANCEUR CLEAR (A) 01/05/2022 0244   APPEARANCEUR Clear 02/19/2012 1128   LABSPEC 1.023 01/05/2022 0244   LABSPEC 1.010 02/19/2012 1128   PHURINE 6.0 01/05/2022 0244   GLUCOSEU >=500 (A) 01/05/2022 0244   GLUCOSEU Negative 02/19/2012 1128   HGBUR NEGATIVE 01/05/2022 0244   BILIRUBINUR NEGATIVE 01/05/2022 0244   BILIRUBINUR 1 mg/dL 07/26/2016 1624   BILIRUBINUR Negative 02/19/2012 1128   KETONESUR NEGATIVE 01/05/2022 0244   PROTEINUR 30  (A) 01/05/2022 0244   UROBILINOGEN 1.0 07/26/2016 1624   NITRITE NEGATIVE 01/05/2022 0244   LEUKOCYTESUR NEGATIVE 01/05/2022 0244   LEUKOCYTESUR Negative 02/19/2012 1128   Sepsis Labs Recent Labs  Lab 01/03/22 0959 01/04/22 0615 01/05/22 0534  WBC 19.2* 16.8* 20.5*   Microbiology Recent Results (from the past 240 hour(s))  Resp Panel by RT-PCR (Flu A&B, Covid) Anterior Nasal Swab     Status: None   Collection Time: 01/03/22  9:59 AM   Specimen: Anterior Nasal Swab  Result Value Ref Range Status   SARS Coronavirus 2 by RT PCR NEGATIVE NEGATIVE Final    Comment: (NOTE) SARS-CoV-2 target nucleic acids are NOT DETECTED.  The SARS-CoV-2 RNA is generally detectable in upper respiratory specimens during the acute phase of infection. The lowest concentration of SARS-CoV-2 viral copies this assay can detect is 138 copies/mL. A negative result does not preclude SARS-Cov-2 infection and should not be used as the sole basis for treatment or other patient management decisions. A negative result may occur with  improper specimen collection/handling, submission of specimen other than nasopharyngeal swab, presence of viral mutation(s) within the areas targeted by this assay, and inadequate number of viral copies(<138 copies/mL). A negative result must be combined with clinical observations, patient history, and epidemiological information. The expected result is Negative.  Fact Sheet for Patients:  EntrepreneurPulse.com.au  Fact Sheet for Healthcare Providers:  IncredibleEmployment.be  This test is no t yet approved or cleared by the [place] FDA and  has been authorized for detection and/or diagnosis of SARS-CoV-2 by FDA under an Emergency Use Authorization (EUA). This EUA will remain  in effect (meaning this test can be used) for the duration of the COVID-19 declaration under Section 564(b)(1) of the Act, 21 U.S.C.section 360bbb-3(b)(1),  unless the authorization is terminated  or revoked sooner.       Influenza A by PCR NEGATIVE NEGATIVE Final   Influenza B by PCR NEGATIVE NEGATIVE Final    Comment: (NOTE) The Xpert Xpress SARS-CoV-2/FLU/RSV plus assay is intended as an aid in the diagnosis of influenza from Nasopharyngeal swab specimens and should not be used as a sole basis for treatment. Nasal washings and aspirates are unacceptable for Xpert Xpress SARS-CoV-2/FLU/RSV testing.  Fact Sheet for Patients: EntrepreneurPulse.com.au  Fact Sheet for Healthcare Providers: IncredibleEmployment.be  This test is not yet approved or cleared by the [place] FDA and has been authorized for detection and/or diagnosis of SARS-CoV-2 by FDA under an Emergency Use Authorization (EUA). This EUA will remain in effect (meaning this test can be used) for the duration of the COVID-19 declaration under Section 564(b)(1) of the Act, 21 U.S.C. section 360bbb-3(b)(1), unless the authorization is terminated or revoked.  Performed at [facility], [address] [place], [place] [address]   Blood culture (routine x 2)  Status: None (Preliminary result)   Collection Time: 01/03/22 10:45 AM   Specimen: BLOOD  Result Value Ref Range Status   Specimen Description BLOOD RIGHT ANTECUBITAL  Final   Special Requests   Final    BOTTLES DRAWN AEROBIC AND ANAEROBIC Blood Culture results may not be optimal due to an inadequate volume of blood received in culture bottles   Culture   Final    NO GROWTH 2 DAYS Performed at [facility], [address] [place], [place] [address]    Report Status PENDING  Incomplete  Blood culture (routine x 2)     Status: None (Preliminary result)   Collection Time: 01/03/22 10:45 AM   Specimen: BLOOD  Result Value Ref Range Status   Specimen Description BLOOD LEFT ANTECUBITAL  Final   Special Requests   Final    BOTTLES DRAWN AEROBIC AND  ANAEROBIC Blood Culture adequate volume   Culture   Final    NO GROWTH 2 DAYS Performed at [facility], [address] [place], [place] [address]    Report Status PENDING  Incomplete   Imaging US Venous Img Lower Bilateral (DVT)  Result Date: 01/03/2022 CLINICAL DATA:  Lower extremity pain and edema EXAM: BILATERAL LOWER EXTREMITY VENOUS DOPPLER ULTRASOUND TECHNIQUE: Gray-scale sonography with graded compression, as well as color Doppler and duplex ultrasound were performed to evaluate the lower extremity deep venous systems from the level of the common femoral vein and including the common femoral, femoral, profunda femoral, popliteal and calf veins including the posterior tibial, peroneal and gastrocnemius veins when visible. The superficial great saphenous vein was also interrogated. Spectral Doppler was utilized to evaluate flow at rest and with distal augmentation maneuvers in the common femoral, femoral and popliteal veins. COMPARISON:  None Available. FINDINGS: RIGHT LOWER EXTREMITY Common Femoral Vein: No evidence of thrombus. Normal compressibility, respiratory phasicity and response to augmentation. Saphenofemoral Junction: No evidence of thrombus. Normal compressibility and flow on color Doppler imaging. Profunda Femoral Vein: No evidence of thrombus. Normal compressibility and flow on color Doppler imaging. Femoral Vein: No evidence of thrombus. Normal compressibility, respiratory phasicity and response to augmentation. Popliteal Vein: No evidence of thrombus. Normal compressibility, respiratory phasicity and response to augmentation. Calf Veins: No evidence of thrombus. Normal compressibility and flow on color Doppler imaging. Superficial Great Saphenous Vein: No evidence of thrombus. Normal compressibility. Venous Reflux:  None. Other Findings:  Lower leg subcutaneous edema. LEFT LOWER EXTREMITY Common Femoral Vein: No evidence of thrombus. Normal compressibility, respiratory  phasicity and response to augmentation. Saphenofemoral Junction: No evidence of thrombus. Normal compressibility and flow on color Doppler imaging. Profunda Femoral Vein: No evidence of thrombus. Normal compressibility and flow on color Doppler imaging. Femoral Vein: No evidence of thrombus. Normal compressibility, respiratory phasicity and response to augmentation. Popliteal Vein: No evidence of thrombus. Normal compressibility, respiratory phasicity and response to augmentation. Calf Veins: No evidence of thrombus. Normal compressibility and flow on color Doppler imaging. Superficial Great Saphenous Vein: No evidence of thrombus. Normal compressibility. Venous Reflux:  None. Other Findings:  Lower leg subcutaneous edema. IMPRESSION: No evidence of deep venous thrombosis in either lower extremity. Electronically Signed   By: Davina Poke D.O.   On: 01/03/2022 16:42   DG Chest Port 1 View  Result Date: 01/03/2022 CLINICAL DATA:  Shortness of breath with bilateral lower extremity weakness/swelling. Oxygen dependent. EXAM: PORTABLE CHEST 1 VIEW COMPARISON:  12/28/2021 FINDINGS: Lungs are adequately inflated demonstrate stable minimal prominence of the central pulmonary vasculature which may  represent mild vascular congestion. No focal airspace consolidation or effusion. Mild stable cardiomegaly. Remainder of the exam is unchanged. IMPRESSION: Mild stable cardiomegaly with suggestion of minimal vascular congestion. Electronically Signed   By: Marin Olp M.D.   On: 01/03/2022 10:10      Time coordinating discharge: over 30 minutes  SIGNED:  Emeterio Reeve DO [facility]

## 2022-01-05 NOTE — Plan of Care (Signed)
Problem: Education: Goal: Ability to describe self-care measures that may prevent or decrease complications (Diabetes Survival Skills Education) will improve Outcome: Adequate for Discharge Goal: Individualized Educational Video(s) Outcome: Adequate for Discharge   Problem: Coping: Goal: Ability to adjust to condition or change in health will improve Outcome: Adequate for Discharge   Problem: Fluid Volume: Goal: Ability to maintain a balanced intake and output will improve Outcome: Adequate for Discharge   Problem: Health Behavior/Discharge Planning: Goal: Ability to identify and utilize available resources and services will improve Outcome: Adequate for Discharge Goal: Ability to manage health-related needs will improve Outcome: Adequate for Discharge   Problem: Metabolic: Goal: Ability to maintain appropriate glucose levels will improve Outcome: Adequate for Discharge   Problem: Nutritional: Goal: Maintenance of adequate nutrition will improve Outcome: Adequate for Discharge Goal: Progress toward achieving an optimal weight will improve Outcome: Adequate for Discharge   Problem: Skin Integrity: Goal: Risk for impaired skin integrity will decrease Outcome: Adequate for Discharge   Problem: Tissue Perfusion: Goal: Adequacy of tissue perfusion will improve Outcome: Adequate for Discharge   Problem: Education: Goal: Knowledge of General Education information will improve Description: Including pain rating scale, medication(s)/side effects and non-pharmacologic comfort measures Outcome: Adequate for Discharge   Problem: Health Behavior/Discharge Planning: Goal: Ability to manage health-related needs will improve Outcome: Adequate for Discharge   Problem: Clinical Measurements: Goal: Ability to maintain clinical measurements within normal limits will improve Outcome: Adequate for Discharge Goal: Will remain free from infection Outcome: Adequate for Discharge Goal:  Diagnostic test results will improve Outcome: Adequate for Discharge Goal: Respiratory complications will improve Outcome: Adequate for Discharge Goal: Cardiovascular complication will be avoided Outcome: Adequate for Discharge   Problem: Activity: Goal: Risk for activity intolerance will decrease Outcome: Adequate for Discharge   Problem: Nutrition: Goal: Adequate nutrition will be maintained Outcome: Adequate for Discharge   Problem: Coping: Goal: Level of anxiety will decrease Outcome: Adequate for Discharge   Problem: Elimination: Goal: Will not experience complications related to bowel motility Outcome: Adequate for Discharge Goal: Will not experience complications related to urinary retention Outcome: Adequate for Discharge   Problem: Pain Managment: Goal: General experience of comfort will improve Outcome: Adequate for Discharge   Problem: Safety: Goal: Ability to remain free from injury will improve Outcome: Adequate for Discharge   Problem: Skin Integrity: Goal: Risk for impaired skin integrity will decrease Outcome: Adequate for Discharge   Problem: Education: Goal: Ability to demonstrate management of disease process will improve Outcome: Adequate for Discharge Goal: Ability to verbalize understanding of medication therapies will improve Outcome: Adequate for Discharge Goal: Individualized Educational Video(s) Outcome: Adequate for Discharge   Problem: Activity: Goal: Capacity to carry out activities will improve Outcome: Adequate for Discharge   Problem: Cardiac: Goal: Ability to achieve and maintain adequate cardiopulmonary perfusion will improve Outcome: Adequate for Discharge   Problem: Education: Goal: Knowledge of disease or condition will improve Outcome: Adequate for Discharge Goal: Knowledge of the prescribed therapeutic regimen will improve Outcome: Adequate for Discharge Goal: Individualized Educational Video(s) Outcome: Adequate for  Discharge   Problem: Activity: Goal: Ability to tolerate increased activity will improve Outcome: Adequate for Discharge Goal: Will verbalize the importance of balancing activity with adequate rest periods Outcome: Adequate for Discharge   Problem: Respiratory: Goal: Ability to maintain a clear airway will improve Outcome: Adequate for Discharge Goal: Levels of oxygenation will improve Outcome: Adequate for Discharge Goal: Ability to maintain adequate ventilation will improve Outcome: Adequate for Discharge   Problem: Clinical  Measurements: Goal: Ability to avoid or minimize complications of infection will improve Outcome: Adequate for Discharge   Problem: Skin Integrity: Goal: Skin integrity will improve Outcome: Adequate for Discharge

## 2022-01-05 NOTE — Progress Notes (Signed)
Talked with the patient to get DME arranged and [place] set up He lives at home alone He has a great support system and his friend Alver Fisher will transport home at DC He has a nebulizer machine at home, a scale that he weighs daily on He is agreeable to Home health I am waiting to hear back from [facility] to see if they can accept him, Alvis Lemmings and [facility] both declined

## 2022-01-05 NOTE — Consult Note (Signed)
ANTICOAGULATION CONSULT NOTE -   Pharmacy Consult for warfarin Indication: atrial fibrillation  Allergies  Allergen Reactions   Atorvastatin Other (See Comments)    Dizziness   Lisinopril Cough    Patient Measurements: Height: '5\' 11"'$  (180.3 cm) Weight: (!) 138 kg (304 lb 3.8 oz) IBW/kg (Calculated) : 75.3   Vital Signs: Temp: 98.2 F (36.8 C) (09/12 0731) BP: 142/85 (09/12 0731) Pulse Rate: 66 (09/12 0731)  Labs: Recent Labs    01/03/22 0959 01/03/22 1158 01/04/22 0615 01/05/22 0534  HGB 11.3*  --  10.2* 10.0*  HCT 40.0  --  36.9* 35.6*  PLT 213  --  189 190  LABPROT 19.5*  --  28.4* 22.8*  INR 1.7*  --  2.7* 2.0*  CREATININE 1.03  --  1.05 0.81  TROPONINIHS 28* 25*  --   --      Estimated Creatinine Clearance: 127.4 mL/min (by C-G formula based on SCr of 0.81 mg/dL).   Medical History: Past Medical History:  Diagnosis Date   Abnormal drug screen 2015   MJ positive x3 - one more and we will stop prescribing ativan (08/2013).   Anemia    Angina    Anxiety    Arthritis    CAD (coronary artery disease)    nonobstructive   Cannabis abuse    Chronic systolic CHF (congestive heart failure) ([place]) 2013   NYHA Class II/III   COPD (chronic obstructive pulmonary disease) (HCC)    Diabetes type 2, uncontrolled    declines DSME   Dilated cardiomyopathy ([place]) 2013   now improved   Ex-smoker    Frequent headaches    History of atrial fibrillation 2013   chronic, s/p ablation prior on coumadin   Hyperlipidemia    Migraine    Nonischemic cardiomyopathy ([place]) 2013   EF 25% per Dr Nehemiah Massed   Obesity    OSA (obstructive sleep apnea)    does not use CPAP - unable to tolerate   Seasonal allergies     Medications:  Scheduled:   acidophilus  1 capsule Oral Daily   amiodarone  200 mg Oral Daily   carvedilol  3.125 mg Oral BID WC   cholecalciferol  2,000 Units Oral Daily   citalopram  10 mg Oral Daily   cyanocobalamin  1,000 mcg Oral Daily   furosemide  40 mg  Intravenous Q12H   insulin aspart  0-5 Units Subcutaneous QHS   insulin aspart  0-9 Units Subcutaneous TID WC   ipratropium-albuterol  3 mL Nebulization TID   montelukast  10 mg Oral QHS   pantoprazole  40 mg Oral BID AC   predniSONE  40 mg Oral Q breakfast   simvastatin  20 mg Oral q1800   Warfarin - Pharmacist Dosing Inpatient   Does not apply q1600   Infusions:   cefTRIAXone (ROCEPHIN)  IV 2 g (01/04/22 0851)   vancomycin 1,000 mg (01/04/22 2211)   PRN: acetaminophen, albuterol, dextromethorphan-guaiFENesin, docusate sodium, hydrALAZINE, hydrOXYzine, ondansetron (ZOFRAN) IV, mouth rinse  Assessment: [name] is a 66 y.o. male presenting with worsening generalized weakness, shortness of breath, bilateral lower extremity edema over the past week. PMH significant for T2DM, CHF, CAD, COPD on 2L [place] chronically, Afib (on warfarin). Per recent CPP note on 12/31/2021, patient on warfarin 5 mg Mon/Thu and 2.5 mg Tue/Wed/Fri/Sat/Sun PTA for Afib (weekly dose 22.5 mg). Per med history, patient has not yet started the new schedule as described and was still taking warfarin 2.5 mg daily  except Wed (weekly dose 15 mg). Last dose of warfarin 2.5 mg PTA was taken 9/8. Pharmacy has been consulted to manage warfarin while in patient.  Baseline Labs: PT 19.5, INR 1.7, Hgb 11.3, Hct 40, Plt 213   9/10 INR 1.7 subtherapeutic  Warfarin 4 mg 9/11 INR 2.7 therapeutic Warfarin 1 mg 9/12 INR 2.0 therapeutic   Goal of Therapy:  INR 2-3 Monitor platelets by anticoagulation protocol: Yes   Plan:  Will order warfarin 2.5 mg x1 today Monitor INR daily and CBC at least once weekly while on warfarin DDI: Amiodarone (on both PTA), steroid noted, Vancomycin/Ceftriaxone  Thank you for allowing pharmacy to be a part of this patient's care.  Chinita Greenland PharmD Clinical Pharmacist 01/05/2022

## 2022-01-05 NOTE — TOC Progression Note (Signed)
Transition of Care [facility]) - Progression Note    Patient Details  Name: [name] MRN: [id number] Date of Birth: Feb 10, 1956  Transition of Care [facility]) CM/SW [place], RN Phone Number: 01/05/2022, 9:39 AM  Clinical Narrative:     [facility] accepted the patient for [facility]  Expected Discharge Plan: [place] Barriers to Discharge: Barriers Resolved  Expected Discharge Plan and Services Expected Discharge Plan: [name]   Discharge Planning Services: CM Consult   Living arrangements for the past 2 months: Single Family Home                 DME Arranged: [place] rolling DME Agency: [employer] Date DME Agency Contacted: 01/05/22 Time DME Agency Contacted: [phone] Representative spoke with at DME Agency: [place] Arranged: RN, PT, OT           Social Determinants of Health ([place]) Interventions    Readmission Risk Interventions     No data to display

## 2022-01-05 NOTE — Progress Notes (Signed)
    Chronic Care Management Pharmacy Assistant   Name: [name]  MRN: [id number] DOB: [birth year]  Reason for Encounter:  CCM (Farxiga PAP Shipment)   Spoke with [name]; processing and shipment will take 10 - 14 business days from approval date of 01/01/2022. Patient is on auto-refill; shipment will be sent patient's home.    [name], CPP notified  Marijean Niemann, [place] Clinical Pharmacy Assistant [phone]

## 2022-01-05 NOTE — Telephone Encounter (Signed)
-----   Message from Charlton Haws, [facility] sent at 01/01/2022  1:45 PM EDT ----- Regarding: call AZ&Me Please call [employer]Me and/or [facility] - we sent Rx for Farxiga 8/7 and pt has not received it yet. AZ&Me enrollment was approved 01/01/22. Can you make sure they are going to ship it soon.

## 2022-01-05 NOTE — Consult Note (Signed)
Pharmacy Antibiotic Note  [name] is a 66 y.o. male admitted on 01/03/2022 with cellulitis. Patient presented with worsening generalized weakness, shortness of breath, bilateral lower extremity edema over the past week. PMH significant for T2DM, CHF, CAD, COPD on 2L [place] chronically, Afib (on warfarin has been consulted for Vancomycin dosing. On exam LLE is red, swollen, and painful. Pharmacy has been consulted for Vancomycin dosing.  Plan: Day 3 of antibiotics Scr improved 1.05>0.81 Will adjust Vancomycin dose from 1000 mg to 1250 mg IV Q12H. Goal AUC 400-550. Expected AUC: 432 Expected Css min: 11.9 SCr used: 0.81 Weight used: IBW, Vd used: 0.5 (BMI 42.8) Patient is also on Ceftriaxone 2g Q24H  Continue to monitor renal function and culture results   Height: '5\' 11"'$  (180.3 cm) Weight: (!) 138 kg (304 lb 3.8 oz) IBW/kg (Calculated) : 75.3  Temp (24hrs), Avg:98.5 F (36.9 C), Min:98.2 F (36.8 C), Max:98.6 F (37 C)  Recent Labs  Lab 01/03/22 0959 01/04/22 0615 01/05/22 0534  WBC 19.2* 16.8* 20.5*  CREATININE 1.03 1.05 0.81  LATICACIDVEN 1.6  --   --      Estimated Creatinine Clearance: 127.4 mL/min (by C-G formula based on SCr of 0.81 mg/dL).    Allergies  Allergen Reactions   Atorvastatin Other (See Comments)    Dizziness   Lisinopril Cough    Antimicrobials this admission: 9/10 Ceftriaxone >>  9/10 Vancomycin >>   Dose adjustments this admission: 9/12: adj vanc 1 gm to '1250mg'$  q12h  Microbiology results: 9/10 BCx: NGx2d  Thank you for allowing pharmacy to be a part of this patient's care.  Chinita Greenland PharmD Clinical Pharmacist 01/05/2022

## 2022-01-05 NOTE — Consult Note (Addendum)
   [facility] CM Inpatient Consult   01/05/2022  [name] 04-05-1956 [id number]   [place] Organization [ACO] Patient: [employer] Medicare   Primary Care Provider: [name], MD at [facility], is an Independent Embedded provider with a Care Management team and program and is listed for the [facility] follow up needs   [facility] Liaison coverage for [facility]   Patient screened for less than 7 days readmission hospitalization with noted  high risk score for unplanned readmission risk.  Reviewed to assess for potential [place] Management service needs for post hospital transition for readmission prevention needs.  Review of patient's medical record reveals patient has been active in the Chronic Care Management program with the Embedded Pharmacist at [facility].     Plan:   Pharmacist encounter notes indicates ongoing diabetes management care management is planned. Will update team member as needed.  For questions contact:    Natividad Brood, RN BSN [facility] Liaison  [phone] business mobile phone Toll free office [phone]  Fax number: [phone] [email or web address]

## 2022-01-05 NOTE — Progress Notes (Signed)
Blood glucose stick not saved at 266.

## 2022-01-05 NOTE — Plan of Care (Signed)
  Problem: Education: Goal: Ability to describe self-care measures that may prevent or decrease complications (Diabetes Survival Skills Education) will improve Outcome: Progressing Goal: Individualized Educational Video(s) Outcome: Progressing   Problem: Coping: Goal: Ability to adjust to condition or change in health will improve Outcome: Progressing   Problem: Fluid Volume: Goal: Ability to maintain a balanced intake and output will improve Outcome: Progressing   Problem: Health Behavior/Discharge Planning: Goal: Ability to identify and utilize available resources and services will improve Outcome: Progressing Goal: Ability to manage health-related needs will improve Outcome: Progressing   Problem: Metabolic: Goal: Ability to maintain appropriate glucose levels will improve Outcome: Progressing   Problem: Nutritional: Goal: Maintenance of adequate nutrition will improve Outcome: Progressing Goal: Progress toward achieving an optimal weight will improve Outcome: Progressing   Problem: Skin Integrity: Goal: Risk for impaired skin integrity will decrease Outcome: Progressing   Problem: Tissue Perfusion: Goal: Adequacy of tissue perfusion will improve Outcome: Progressing   Problem: Education: Goal: Knowledge of General Education information will improve Description: Including pain rating scale, medication(s)/side effects and non-pharmacologic comfort measures Outcome: Progressing   Problem: Health Behavior/Discharge Planning: Goal: Ability to manage health-related needs will improve Outcome: Progressing   Problem: Clinical Measurements: Goal: Ability to maintain clinical measurements within normal limits will improve Outcome: Progressing Goal: Will remain free from infection Outcome: Progressing Goal: Diagnostic test results will improve Outcome: Progressing Goal: Respiratory complications will improve Outcome: Progressing Goal: Cardiovascular complication will  be avoided Outcome: Progressing   Problem: Activity: Goal: Risk for activity intolerance will decrease Outcome: Progressing   Problem: Nutrition: Goal: Adequate nutrition will be maintained Outcome: Progressing   Problem: Coping: Goal: Level of anxiety will decrease Outcome: Progressing   Problem: Elimination: Goal: Will not experience complications related to bowel motility Outcome: Progressing Goal: Will not experience complications related to urinary retention Outcome: Progressing   Problem: Pain Managment: Goal: General experience of comfort will improve Outcome: Progressing   Problem: Safety: Goal: Ability to remain free from injury will improve Outcome: Progressing   Problem: Skin Integrity: Goal: Risk for impaired skin integrity will decrease Outcome: Progressing   Problem: Education: Goal: Ability to demonstrate management of disease process will improve Outcome: Progressing Goal: Ability to verbalize understanding of medication therapies will improve Outcome: Progressing Goal: Individualized Educational Video(s) Outcome: Progressing   Problem: Activity: Goal: Capacity to carry out activities will improve Outcome: Progressing   Problem: Cardiac: Goal: Ability to achieve and maintain adequate cardiopulmonary perfusion will improve Outcome: Progressing   Problem: Education: Goal: Knowledge of disease or condition will improve Outcome: Progressing Goal: Knowledge of the prescribed therapeutic regimen will improve Outcome: Progressing Goal: Individualized Educational Video(s) Outcome: Progressing   Problem: Activity: Goal: Ability to tolerate increased activity will improve Outcome: Progressing Goal: Will verbalize the importance of balancing activity with adequate rest periods Outcome: Progressing   Problem: Respiratory: Goal: Ability to maintain a clear airway will improve Outcome: Progressing Goal: Levels of oxygenation will improve Outcome:  Progressing Goal: Ability to maintain adequate ventilation will improve Outcome: Progressing   Problem: Clinical Measurements: Goal: Ability to avoid or minimize complications of infection will improve Outcome: Progressing   Problem: Skin Integrity: Goal: Skin integrity will improve Outcome: Progressing

## 2022-01-05 NOTE — Progress Notes (Signed)
Discharge instructions, AVS, medications, and follow up expectations reviewed with patient. Patient verbalized that he understood these instructions and had no further questions or concerns. Peripheral Iv removed from left [facility], patient arranged his own transportation. All components of discharge completed.

## 2022-01-06 ENCOUNTER — Ambulatory Visit: Payer: Medicare Other | Admitting: Family Medicine

## 2022-01-06 ENCOUNTER — Telehealth: Payer: Self-pay | Admitting: *Deleted

## 2022-01-06 ENCOUNTER — Telehealth: Payer: Self-pay

## 2022-01-06 ENCOUNTER — Telehealth: Payer: Self-pay | Admitting: Family Medicine

## 2022-01-06 DIAGNOSIS — J441 Chronic obstructive pulmonary disease with (acute) exacerbation: Secondary | ICD-10-CM

## 2022-01-06 NOTE — Telephone Encounter (Signed)
Home Health verbal orders Caller Name: Sabine Name: [name] number: [phone]  Reason: Verbal order for Start of Care for tomorrow instead of today. Patient was dealing with death of dog.    Please forward to [facility] pool or providers CMA

## 2022-01-06 NOTE — Progress Notes (Signed)
Chronic Care Management Pharmacy Assistant   Name: ALMALIK WEISSBERG  MRN: [id number] DOB: Jul 31, 1955  Reason for Encounter: CCM (Hosptial Follow Up)  Medications: Outpatient Encounter Medications as of 01/06/2022  Medication Sig   ACCU-CHEK FASTCLIX LANCETS MISC Check blood sugar once daily and as instructed. Dx 250.00   albuterol (VENTOLIN HFA) 108 (90 Base) MCG/ACT inhaler INHALE 2 PUFFS BY MOUTH EVERY 6 HOURS AS NEEDED FOR WHEEZE OR SHORTNESS OF BREATH   amiodarone (PACERONE) 200 MG tablet Take 1 tablet (200 mg total) by mouth daily.   Blood Glucose Monitoring Suppl (BLOOD GLUCOSE MONITOR SYSTEM) W/DEVICE KIT by Does not apply route. Use to check sugar once daily and as needed Dx: E11.9 **ONE TOUCH VERIO**   Budeson-Glycopyrrol-Formoterol (BREZTRI AEROSPHERE) 160-9-4.8 MCG/ACT AERO Inhale 2 puffs into the lungs 2 (two) times daily.   carvedilol (COREG) 3.125 MG tablet Take 1 tablet (3.125 mg total) by mouth 2 (two) times daily with a meal.   Cholecalciferol (VITAMIN D) 50 MCG (2000 UT) CAPS Take 1 capsule (2,000 Units total) by mouth daily.   citalopram (CELEXA) 10 MG tablet Take 1 tablet (10 mg total) by mouth daily.   dapagliflozin propanediol (FARXIGA) 5 MG TABS tablet Take 1 tablet (5 mg total) by mouth daily before breakfast.   Docusate Calcium (STOOL SOFTENER PO) Take 1 tablet by mouth daily.   fluticasone (FLONASE) 50 MCG/ACT nasal spray SPRAY 2 SPRAYS INTO EACH NOSTRIL EVERY DAY (Patient taking differently: Place 1 spray into both nostrils daily as needed for allergies.)   furosemide (LASIX) 80 MG tablet Take 80 mg by mouth 2 (two) times daily.   glimepiride (AMARYL) 4 MG tablet TAKE 1 TABLET BY MOUTH EVERY DAY WITH BREAKFAST   glucose blood (ONETOUCH VERIO) test strip Check blood sugar 3 times a day   hydrOXYzine (ATARAX) 50 MG tablet TAKE 1 TABLET (50 MG TOTAL) BY MOUTH 2 (TWO) TIMES DAILY AS NEEDED FOR ANXIETY.   ipratropium (ATROVENT HFA) 17 MCG/ACT inhaler Inhale 2 puffs  into the lungs every 6 (six) hours as needed for wheezing.   ipratropium-albuterol (DUONEB) 0.5-2.5 (3) MG/3ML SOLN Take 3 mLs by nebulization every 6 (six) hours as needed (severe shortness of breath/wheezing).   metFORMIN (GLUCOPHAGE) 1000 MG tablet TAKE 1 TABLET (1,000 MG TOTAL) BY MOUTH 2 (TWO) TIMES DAILY WITH A MEAL.   metolazone (ZAROXOLYN) 2.5 MG tablet Take 1 tablet (2.5 mg total) by mouth 2 (two) times a week. (Patient taking differently: Take 2.5 mg by mouth 2 (two) times a week. Saturday and wednesday)   montelukast (SINGULAIR) 10 MG tablet TAKE 1 TABLET BY MOUTH EVERYDAY AT BEDTIME   NON FORMULARY 1,200 mg daily. SEA Malynn Lucy   NON FORMULARY daily. SUPER BEETS   NON FORMULARY 500 mg once. TURMERIC (Patient not taking: Reported on 01/03/2022)   pantoprazole (PROTONIX) 40 MG tablet TAKE 1 TABLET (40 MG TOTAL) BY MOUTH 2 (TWO) TIMES DAILY BEFORE A MEAL.   potassium chloride SA (KLOR-CON M) 20 MEQ tablet Take 1 tablet (20 mEq total) by mouth daily for 7 days.   predniSONE (DELTASONE) 20 MG tablet Take 2 tablets (40 mg total) by mouth daily with breakfast.   Probiotic Product (PROBIOTIC DAILY PO) Take 1 tablet by mouth daily.   simvastatin (ZOCOR) 20 MG tablet TAKE 1 TABLET BY MOUTH EVERY DAY IN THE EVENING   sulfamethoxazole-trimethoprim (BACTRIM DS) 800-160 MG tablet Take 1 tablet by mouth 2 (two) times daily for 7 days.   vitamin B-12 (CYANOCOBALAMIN)  1000 MCG tablet Take 1 tablet (1,000 mcg total) by mouth every Monday, Wednesday, and Friday. (Patient taking differently: Take 1,000 mcg by mouth daily.)   warfarin (COUMADIN) 5 MG tablet TAKE 1/2 TABLET BY MOUTH DAILY EXCEPT TAKE NOTHING ON WEDNESDAYS OR AS DIRECTED BY ANTICOAGULATION CLINIC   No facility-administered encounter medications on file as of 01/06/2022.    Reviewed hospital notes for details of recent visit. Patient has been contacted by Transitions of Care team: Yes  Admitted to the hospital on 01/03/22. Discharge date was  01/05/22.  Discharged from [facility].   Discharge diagnosis (Principal Problem): Acute on chronic diastolic CHF  Patient was discharged to Home with Home Health  Brief summary of hospital course: [name] is a 66 y.o. male with medical history significant of COPD on 2 L oxygen at home, hyperlipidemia, diabetes mellitus, GERD, depression, OSA (uses CPAP not consistently), morbid obesity with a BMI of 42.89, atrial fibrillation on Coumadin, dCHF, CAD, anemia, marijuana use, who presents with leg pain 01/03/2022 from home via EMS. Patient states that he has SOB and  chronic lower extremity edema which has worsened in the past 4 weeks.  The left leg is worse than the right.  Recent admission 12/28/2021 to 09/10: ED course and early admission-Per EMS, SPO2 60% room air (not wearing his usual O2), arrived on 8 L NRB SPO2 100%.  WBC 19.2, troponin 28, BNP 289, lactic acid 1.6, temperature 99, RR 22, CXR cardiomegaly and vascular congestion.  Admitted for acute on chronic diastolic CHF, COPD exacerbation, sepsis due to cellulitis bilateral lower extremities with left worse than right.  Started on diuresis, fluid restriction, bronchodilators, steroids, vancomycin plus ceftriaxone.  Blood cultures pending.  Did not receive IV fluids due to CHF exacerbation and given normal lactic acid.  LE venous Dopplers to evaluate for DVT negative.  Troponins trended and flat - no ACS.  09/11: WBC trending down to 16.8.  Other than hypoxia, VSS.  No longer meeting criteria for sepsis. Reports pain in L leg is improved. Plan additional day IV abx and await BCx. Does not appear seriously fluid overloaded at this time.   New?Medications Started at [facility] Discharge:?? -Started ipratropium-albuterol (DUONEB) 0.5-2.5 (3) MG/3ML SOLN -Started potassium chloride SA (KLOR-CON M) 20 MEQ tablet -Started predniSONE (DELTASONE) 20 MG tablet -Started sulfamethoxazole-trimethoprim (BACTRIM DS) 800-160 MG  tablet  Medication Changes at Hospital Discharge: -Changed albuterol (VENTOLIN HFA) 108 (90 Base) MCG/ACT inhaler -Changed carvedilol (COREG) 3.125 MG tablet  Medications Discontinued at Hospital Discharge: -Stopped torsemide 10 MG tablet (DEMADEX) -Stopped triamcinolone cream 0.1 % (KENALOG)  Medications that remain the same after Hospital Discharge:??  -All other medications will remain the same.    Next CCM appt: 02/01/2022  Other upcoming appts: PCP appointment on 01/26/2022 for physical  [name], PharmD notified and will determine if action is needed.  [name], CPP notified  Marijean Niemann, [place] Clinical Pharmacy Assistant [phone]

## 2022-01-06 NOTE — Patient Outreach (Addendum)
  Care Coordination [facility] Note Transition Care Management Follow-up Telephone Call Date of discharge and from where: [facility] [phone] How have you been since you were released from the hospital? N Any questions or concerns? Yes Transportation  Items Reviewed: Did the pt receive and understand the discharge instructions provided? Yes  Medications obtained and verified? Yes  Other? No  Any new allergies since your discharge? No  Dietary orders reviewed? Yes Do you have support at home? Yes   Home Care and Equipment/Supplies: Were home health services ordered? no If so, what is the name of the agency? N   Has the agency set up a time to come to the patient's home? not applicable Were any new equipment or medical supplies ordered?  No What is the name of the medical supply agency? N/a Were you able to get the supplies/equipment? not applicable Do you have any questions related to the use of the equipment or supplies? No  Functional Questionnaire: (I = Independent and D = Dependent) ADLs: I  Bathing/Dressing- I  Meal Prep- I  Eating- I  Maintaining continence- I  Transferring/Ambulation- I  Managing Meds- I  Follow up appointments reviewed: Y PCP Hospital f/u appt confirmed? Yes  Scheduled to see Dr Danise Mina [phone] 9:30 [place] [facility] f/u appt confirmed? Yes  Scheduled to see Darylene Price [phone] 9:AM  [facility] [phone] 11:00 Are transportation arrangements needed? Yes  If their condition worsens, is the pt aware to call PCP or go to the Emergency Dept.? Yes Was the patient provided with contact information for the PCP's office or ED? Yes Was to pt encouraged to call back with questions or concerns? Yes  SDOH assessments and interventions completed:   Yes  Care Coordination Interventions Activated:  Yes   Care Coordination Interventions:  Transportation arranged   Referred to care Coordinator Quinn Plowman [phone] 10:00 am  Encounter Outcome:  Pt. Visit  Completed    [place] Management [phone]

## 2022-01-06 NOTE — Telephone Encounter (Signed)
Agree with this. Thanks.  

## 2022-01-06 NOTE — Telephone Encounter (Signed)
[name] was given verbal orders by telephone and verbalized understanding.

## 2022-01-06 NOTE — Patient Outreach (Signed)
  Care Coordination [facility] Note Transition Care Management Unsuccessful Follow-up Telephone Call  Date of discharge and from where:  [phone] [facility]  Attempts:  2nd Attempt  Reason for unsuccessful TCM follow-up call:  Left voice message  [place] Care Management [phone]

## 2022-01-06 NOTE — Patient Outreach (Signed)
  Care Coordination [facility] Note Transition Care Management Follow-up Telephone Call Date of discharge and from where: [id number] [facility]  Encounter Outcome:  Pt. Request to Call [facility] [phone]

## 2022-01-07 ENCOUNTER — Ambulatory Visit (INDEPENDENT_AMBULATORY_CARE_PROVIDER_SITE_OTHER): Payer: Medicare Other

## 2022-01-07 ENCOUNTER — Telehealth: Payer: Self-pay

## 2022-01-07 DIAGNOSIS — L97829 Non-pressure chronic ulcer of other part of left lower leg with unspecified severity: Secondary | ICD-10-CM | POA: Diagnosis not present

## 2022-01-07 DIAGNOSIS — G4733 Obstructive sleep apnea (adult) (pediatric): Secondary | ICD-10-CM | POA: Diagnosis not present

## 2022-01-07 DIAGNOSIS — E1165 Type 2 diabetes mellitus with hyperglycemia: Secondary | ICD-10-CM | POA: Diagnosis not present

## 2022-01-07 DIAGNOSIS — I5033 Acute on chronic diastolic (congestive) heart failure: Secondary | ICD-10-CM | POA: Diagnosis not present

## 2022-01-07 DIAGNOSIS — Z7901 Long term (current) use of anticoagulants: Secondary | ICD-10-CM | POA: Diagnosis not present

## 2022-01-07 DIAGNOSIS — Z5181 Encounter for therapeutic drug level monitoring: Secondary | ICD-10-CM | POA: Diagnosis not present

## 2022-01-07 DIAGNOSIS — M1990 Unspecified osteoarthritis, unspecified site: Secondary | ICD-10-CM | POA: Diagnosis not present

## 2022-01-07 DIAGNOSIS — I4891 Unspecified atrial fibrillation: Secondary | ICD-10-CM | POA: Diagnosis not present

## 2022-01-07 DIAGNOSIS — I420 Dilated cardiomyopathy: Secondary | ICD-10-CM | POA: Diagnosis not present

## 2022-01-07 DIAGNOSIS — E785 Hyperlipidemia, unspecified: Secondary | ICD-10-CM | POA: Diagnosis not present

## 2022-01-07 DIAGNOSIS — L03116 Cellulitis of left lower limb: Secondary | ICD-10-CM | POA: Diagnosis not present

## 2022-01-07 DIAGNOSIS — I110 Hypertensive heart disease with heart failure: Secondary | ICD-10-CM | POA: Diagnosis not present

## 2022-01-07 DIAGNOSIS — G43909 Migraine, unspecified, not intractable, without status migrainosus: Secondary | ICD-10-CM | POA: Diagnosis not present

## 2022-01-07 DIAGNOSIS — K219 Gastro-esophageal reflux disease without esophagitis: Secondary | ICD-10-CM | POA: Diagnosis not present

## 2022-01-07 DIAGNOSIS — I252 Old myocardial infarction: Secondary | ICD-10-CM | POA: Diagnosis not present

## 2022-01-07 DIAGNOSIS — I872 Venous insufficiency (chronic) (peripheral): Secondary | ICD-10-CM | POA: Diagnosis not present

## 2022-01-07 DIAGNOSIS — Z9181 History of falling: Secondary | ICD-10-CM | POA: Diagnosis not present

## 2022-01-07 DIAGNOSIS — I25119 Atherosclerotic heart disease of native coronary artery with unspecified angina pectoris: Secondary | ICD-10-CM | POA: Diagnosis not present

## 2022-01-07 DIAGNOSIS — J441 Chronic obstructive pulmonary disease with (acute) exacerbation: Secondary | ICD-10-CM | POA: Diagnosis not present

## 2022-01-07 DIAGNOSIS — L97229 Non-pressure chronic ulcer of left calf with unspecified severity: Secondary | ICD-10-CM | POA: Diagnosis not present

## 2022-01-07 DIAGNOSIS — L03115 Cellulitis of right lower limb: Secondary | ICD-10-CM | POA: Diagnosis not present

## 2022-01-07 DIAGNOSIS — I5022 Chronic systolic (congestive) heart failure: Secondary | ICD-10-CM | POA: Diagnosis not present

## 2022-01-07 DIAGNOSIS — E876 Hypokalemia: Secondary | ICD-10-CM | POA: Diagnosis not present

## 2022-01-07 DIAGNOSIS — F32A Depression, unspecified: Secondary | ICD-10-CM | POA: Diagnosis not present

## 2022-01-07 LAB — POCT INR: INR: 1.6 — AB (ref 2.0–3.0)

## 2022-01-07 NOTE — Progress Notes (Signed)
Pt hospitalized from 9/10 - 9/12 for acute CHF exacerbation, COPD exacerbation, and sepsis. Received IV vancomycin, IV Rocephin, and prednisone during stay. Was sent home with rx for three tablets of Prednisone 20 mg tablets and states he finished prednisone yesterday. EMR shows rx for Bactrim DS 800-160 mg that patient denies taking.   Pt reports not taking warfarin since hospital stay except for taking 1/2 tablet this am.    Increase dose today to take 1 tablet and then continue 1/2 tablet daily except take nothing on Sundays. Recheck in 1 week with home health nurse.

## 2022-01-07 NOTE — Patient Instructions (Addendum)
Pre visit review using our clinic review tool, if applicable. No additional management support is needed unless otherwise documented below in the visit note.  Increase dose today to take 1 tablet and then continue 1/2 tablet daily except take nothing on Sundays. Recheck in 1 week with home health nurse.

## 2022-01-07 NOTE — Telephone Encounter (Signed)
   Telephone encounter was:  Successful.  01/07/2022 Name: SUN WILENSKY MRN: [id number] DOB: 12-03-1955  [name] Requena is a 66 y.o. year old male who is a primary care patient of [name], MD . The community resource team was consulted for assistance with Transportation Needs   Care guide performed the following interventions:  Unable to speak with patient but was able to confirm patient has 24 one-way trips or 12 round-way trips / with safe ride under [facility]. The contact is [phone] 8am-5pm cst - 7 days a week . [facility] [place] does not transport to patient's address. CG will send information in mail as well.   Follow Up Plan:   Contact patient later on today in order to assist on booking 9/19 ride per referral  [place] management  [place], [place]  Main Phone: [phone]  E-mail: [email or web address]  Website: [email or web address]

## 2022-01-07 NOTE — Patient Outreach (Signed)
[place] May 12, 1955 [id number]  [facility] [employer] [ACO] Patient: [employer] Medicare  Primary Care Provider: [name], MD at [facility]  Referral:  Readmission Report request  [facility] medical record was reviewed for report and follow up.  Patient active with Chronic Care Management pharmacist and has been referred for [place] after [facility] call.  Plan:  Report to be sent.  Natividad Brood, RN BSN [place]  [phone] business mobile phone Toll free office [phone]  *[place]  [phone] Fax number: [phone] [email or web address]

## 2022-01-08 ENCOUNTER — Telehealth: Payer: Self-pay | Admitting: Family Medicine

## 2022-01-08 ENCOUNTER — Telehealth: Payer: Self-pay

## 2022-01-08 LAB — CULTURE, BLOOD (ROUTINE X 2)
Culture: NO GROWTH
Culture: NO GROWTH
Special Requests: ADEQUATE

## 2022-01-08 NOTE — Telephone Encounter (Signed)
Home Health verbal orders Caller Name: [place] Name: [name] number: [phone]  Requesting Skilled nursing  Frequency: 1x a wk for 1 wk, 2x a wk for 1 wk, and 1x a wk for 7wks   Wound Care order: left leg is open and weeping in two spots. Order: [name], Kerlix, and [place]  Please forward to [facility] pool or providers CMA

## 2022-01-08 NOTE — Telephone Encounter (Addendum)
   Telephone encounter was:  Successful.  01/08/2022 Name: ALEXANDR YAWORSKI MRN: [id number] DOB: 09/26/1955  [name] Chastain is a 66 y.o. year old male who is a primary care patient of [name], MD . The community resource team was consulted for assistance with Transportation Needs   Care guide performed the following interventions: Patient provided with information about care guide support team and interviewed to confirm resource needs. CG advised patient of transportation updates. He has [place] 12 round trip rides and can call [facility] [phone] and [place] line [phone] if further ride support is needed.   A conference call was made with patient and [employer] in order to book a ride for 01/12/2022 appt.  Ride 9:02am ride [phone] Ride 10:15am ride [phone]  An Transportation education letter was sent to patient's address on file per request. Pt has been advised: I have mailed the following information and if he has not received the information in 7 to 14 days or have additional questions to please call me back at [phone]. Patient understood.   Follow Up Plan:  No further follow up planned at this time. The patient has been provided with needed resources.  [facility] management  [place], [place]  Main Phone: [phone]  E-mail: [email or web address]  Website: [email or web address]

## 2022-01-08 NOTE — Telephone Encounter (Addendum)
Agree with this. Thanks.  I spoke with [name] about this.

## 2022-01-09 ENCOUNTER — Other Ambulatory Visit: Payer: Self-pay | Admitting: Family Medicine

## 2022-01-09 DIAGNOSIS — F32A Depression, unspecified: Secondary | ICD-10-CM | POA: Diagnosis not present

## 2022-01-09 DIAGNOSIS — I872 Venous insufficiency (chronic) (peripheral): Secondary | ICD-10-CM | POA: Diagnosis not present

## 2022-01-09 DIAGNOSIS — M1990 Unspecified osteoarthritis, unspecified site: Secondary | ICD-10-CM | POA: Diagnosis not present

## 2022-01-09 DIAGNOSIS — I25119 Atherosclerotic heart disease of native coronary artery with unspecified angina pectoris: Secondary | ICD-10-CM | POA: Diagnosis not present

## 2022-01-09 DIAGNOSIS — L03115 Cellulitis of right lower limb: Secondary | ICD-10-CM | POA: Diagnosis not present

## 2022-01-09 DIAGNOSIS — Z5181 Encounter for therapeutic drug level monitoring: Secondary | ICD-10-CM | POA: Diagnosis not present

## 2022-01-09 DIAGNOSIS — I252 Old myocardial infarction: Secondary | ICD-10-CM | POA: Diagnosis not present

## 2022-01-09 DIAGNOSIS — I110 Hypertensive heart disease with heart failure: Secondary | ICD-10-CM | POA: Diagnosis not present

## 2022-01-09 DIAGNOSIS — E785 Hyperlipidemia, unspecified: Secondary | ICD-10-CM | POA: Diagnosis not present

## 2022-01-09 DIAGNOSIS — E1165 Type 2 diabetes mellitus with hyperglycemia: Secondary | ICD-10-CM | POA: Diagnosis not present

## 2022-01-09 DIAGNOSIS — L97829 Non-pressure chronic ulcer of other part of left lower leg with unspecified severity: Secondary | ICD-10-CM | POA: Diagnosis not present

## 2022-01-09 DIAGNOSIS — Z9181 History of falling: Secondary | ICD-10-CM | POA: Diagnosis not present

## 2022-01-09 DIAGNOSIS — L97229 Non-pressure chronic ulcer of left calf with unspecified severity: Secondary | ICD-10-CM | POA: Diagnosis not present

## 2022-01-09 DIAGNOSIS — L03116 Cellulitis of left lower limb: Secondary | ICD-10-CM | POA: Diagnosis not present

## 2022-01-09 DIAGNOSIS — I420 Dilated cardiomyopathy: Secondary | ICD-10-CM | POA: Diagnosis not present

## 2022-01-09 DIAGNOSIS — I5022 Chronic systolic (congestive) heart failure: Secondary | ICD-10-CM | POA: Diagnosis not present

## 2022-01-09 DIAGNOSIS — K219 Gastro-esophageal reflux disease without esophagitis: Secondary | ICD-10-CM | POA: Diagnosis not present

## 2022-01-09 DIAGNOSIS — I4891 Unspecified atrial fibrillation: Secondary | ICD-10-CM | POA: Diagnosis not present

## 2022-01-09 DIAGNOSIS — G4733 Obstructive sleep apnea (adult) (pediatric): Secondary | ICD-10-CM | POA: Diagnosis not present

## 2022-01-09 DIAGNOSIS — E876 Hypokalemia: Secondary | ICD-10-CM | POA: Diagnosis not present

## 2022-01-09 DIAGNOSIS — I5033 Acute on chronic diastolic (congestive) heart failure: Secondary | ICD-10-CM | POA: Diagnosis not present

## 2022-01-09 DIAGNOSIS — G43909 Migraine, unspecified, not intractable, without status migrainosus: Secondary | ICD-10-CM | POA: Diagnosis not present

## 2022-01-09 DIAGNOSIS — J441 Chronic obstructive pulmonary disease with (acute) exacerbation: Secondary | ICD-10-CM | POA: Diagnosis not present

## 2022-01-11 DIAGNOSIS — G43909 Migraine, unspecified, not intractable, without status migrainosus: Secondary | ICD-10-CM | POA: Diagnosis not present

## 2022-01-11 DIAGNOSIS — L97229 Non-pressure chronic ulcer of left calf with unspecified severity: Secondary | ICD-10-CM | POA: Diagnosis not present

## 2022-01-11 DIAGNOSIS — L97829 Non-pressure chronic ulcer of other part of left lower leg with unspecified severity: Secondary | ICD-10-CM | POA: Diagnosis not present

## 2022-01-11 DIAGNOSIS — M1990 Unspecified osteoarthritis, unspecified site: Secondary | ICD-10-CM | POA: Diagnosis not present

## 2022-01-11 DIAGNOSIS — I4891 Unspecified atrial fibrillation: Secondary | ICD-10-CM | POA: Diagnosis not present

## 2022-01-11 DIAGNOSIS — L03116 Cellulitis of left lower limb: Secondary | ICD-10-CM | POA: Diagnosis not present

## 2022-01-11 DIAGNOSIS — L03115 Cellulitis of right lower limb: Secondary | ICD-10-CM | POA: Diagnosis not present

## 2022-01-11 DIAGNOSIS — K219 Gastro-esophageal reflux disease without esophagitis: Secondary | ICD-10-CM | POA: Diagnosis not present

## 2022-01-11 DIAGNOSIS — Z5181 Encounter for therapeutic drug level monitoring: Secondary | ICD-10-CM | POA: Diagnosis not present

## 2022-01-11 DIAGNOSIS — E785 Hyperlipidemia, unspecified: Secondary | ICD-10-CM | POA: Diagnosis not present

## 2022-01-11 DIAGNOSIS — Z9181 History of falling: Secondary | ICD-10-CM | POA: Diagnosis not present

## 2022-01-11 DIAGNOSIS — I87312 Chronic venous hypertension (idiopathic) with ulcer of left lower extremity: Secondary | ICD-10-CM | POA: Diagnosis not present

## 2022-01-11 DIAGNOSIS — I252 Old myocardial infarction: Secondary | ICD-10-CM | POA: Diagnosis not present

## 2022-01-11 DIAGNOSIS — I5022 Chronic systolic (congestive) heart failure: Secondary | ICD-10-CM | POA: Diagnosis not present

## 2022-01-11 DIAGNOSIS — I5033 Acute on chronic diastolic (congestive) heart failure: Secondary | ICD-10-CM | POA: Diagnosis not present

## 2022-01-11 DIAGNOSIS — G4733 Obstructive sleep apnea (adult) (pediatric): Secondary | ICD-10-CM | POA: Diagnosis not present

## 2022-01-11 DIAGNOSIS — J441 Chronic obstructive pulmonary disease with (acute) exacerbation: Secondary | ICD-10-CM | POA: Diagnosis not present

## 2022-01-11 DIAGNOSIS — E876 Hypokalemia: Secondary | ICD-10-CM | POA: Diagnosis not present

## 2022-01-11 DIAGNOSIS — I25119 Atherosclerotic heart disease of native coronary artery with unspecified angina pectoris: Secondary | ICD-10-CM | POA: Diagnosis not present

## 2022-01-11 DIAGNOSIS — L97222 Non-pressure chronic ulcer of left calf with fat layer exposed: Secondary | ICD-10-CM | POA: Diagnosis not present

## 2022-01-11 DIAGNOSIS — F32A Depression, unspecified: Secondary | ICD-10-CM | POA: Diagnosis not present

## 2022-01-11 DIAGNOSIS — I872 Venous insufficiency (chronic) (peripheral): Secondary | ICD-10-CM | POA: Diagnosis not present

## 2022-01-11 DIAGNOSIS — I420 Dilated cardiomyopathy: Secondary | ICD-10-CM | POA: Diagnosis not present

## 2022-01-11 DIAGNOSIS — E1165 Type 2 diabetes mellitus with hyperglycemia: Secondary | ICD-10-CM | POA: Diagnosis not present

## 2022-01-11 DIAGNOSIS — I110 Hypertensive heart disease with heart failure: Secondary | ICD-10-CM | POA: Diagnosis not present

## 2022-01-11 NOTE — Telephone Encounter (Signed)
Noted  

## 2022-01-11 NOTE — Progress Notes (Signed)
[name], [name] ([id number]) Visit Report for 10/07/2021 Arrival Information Details Patient Name: [name]. Date of Service: 10/07/2021 12:30 PM Medical Record Number: [id number] Patient Account Number: [id number] Date of Birth/Sex: 03/06/1956 (66 y.o. M) Treating RN: Carlene Coria Primary Care Arlander Gillen: [name] Other Clinician: Referring Nikaya Nasby: [name] Treating [name] Nees/Extender: Yaakov Guthrie in Treatment: 5 Visit Information History Since Last Visit All ordered tests and consults were completed: No Patient Arrived: Ambulatory Added or deleted any medications: No Arrival Time: 12:37 Any new allergies or adverse reactions: No Accompanied By: self Had a fall or experienced change in No Transfer Assistance: None activities of daily living that may affect Patient Identification Verified: Yes risk of falls: Secondary Verification Process Completed: Yes Signs or symptoms of abuse/neglect since last visito No Patient Requires Transmission-Based Precautions: No Hospitalized since last visit: No Patient Has Alerts: Yes Implantable device outside of the clinic excluding No Patient Alerts: DIABETIC cellular tissue based products placed in the center since last visit: Has Dressing in Place as Prescribed: Yes Pain Present Now: No Electronic Signature(s) Signed: 01/11/2022 2:33:39 PM By: Carlene Coria RN Entered By: Carlene Coria on 10/07/2021 12:43:09 Toole, [name] ([id number]) -------------------------------------------------------------------------------- Clinic Level of Care Assessment Details Patient Name: [name]. Date of Service: 10/07/2021 12:30 PM Medical Record Number: [id number] Patient Account Number: [id number] Date of Birth/Sex: [birth year] (66 y.o. M) Treating RN: Carlene Coria Primary Care Elianna Windom: [name] Other Clinician: Referring Danamarie Minami: [name] Treating Lannette Avellino/Extender: Yaakov Guthrie in Treatment: 5 Clinic Level of Care Assessment Items TOOL 1 Quantity Score '[]'$  - Use when EandM and Procedure is performed on INITIAL visit 0 ASSESSMENTS - Nursing Assessment / Reassessment '[]'$  - General Physical Exam (combine w/ comprehensive assessment (listed just below) when performed on new 0 pt. evals) '[]'$  - 0 Comprehensive Assessment (HX, ROS, Risk Assessments, Wounds Hx, etc.) ASSESSMENTS - Wound and Skin Assessment / Reassessment '[]'$  - Dermatologic / Skin Assessment (not related to wound area) 0 ASSESSMENTS - Ostomy and/or Continence Assessment and Care '[]'$  - Incontinence Assessment and Management 0 '[]'$  - 0 Ostomy Care Assessment and Management (repouching, etc.) PROCESS - Coordination of Care '[]'$  - Simple Patient / Family Education for ongoing care 0 '[]'$  - 0 Complex (extensive) Patient / Family Education for ongoing care '[]'$  - 0 Staff obtains Programmer, systems, Records, Test Results / Process Orders '[]'$  - 0 Staff telephones HHA, Nursing Homes / Clarify orders / etc '[]'$  - 0 Routine Transfer to another Facility (non-emergent condition) '[]'$  - 0 Routine Hospital Admission (non-emergent condition) '[]'$  - 0 New Admissions / Biomedical engineer / Ordering NPWT, Apligraf, etc. '[]'$  - 0 Emergency Hospital Admission (emergent condition) PROCESS - Special Needs '[]'$  - Pediatric / Minor Patient Management 0 '[]'$  - 0 Isolation Patient Management '[]'$  - 0 Hearing / Language / Visual special needs '[]'$  - 0 Assessment of Community assistance (transportation, D/C planning, etc.) '[]'$  - 0 Additional assistance / Altered mentation '[]'$  - 0 Support Surface(s) Assessment (bed, cushion, seat, etc.) INTERVENTIONS - Miscellaneous '[]'$  - External ear exam 0 '[]'$  - 0 Patient Transfer (multiple staff / Civil Service fast streamer / Similar devices) '[]'$  - 0 Simple Staple / Suture removal (25 or less) '[]'$  - 0 Complex Staple / Suture removal (26 or more) '[]'$  - 0 Hypo/Hyperglycemic Management (do not check if billed  separately) '[]'$  - 0 Ankle / Brachial Index (ABI) - do not check if billed separately Has the patient been seen at the hospital within the last three years: Yes Total  Score: 0 Level Of Care: ____ [name] ([id number]) Electronic Signature(s) Signed: 01/11/2022 2:33:39 PM By: Carlene Coria RN Entered By: Carlene Coria on 10/07/2021 13:11:22 Horne, [name] ([id number]) -------------------------------------------------------------------------------- Encounter Discharge Information Details Patient Name: [name]. Date of Service: 10/07/2021 12:30 PM Medical Record Number: [id number] Patient Account Number: [id number] Date of Birth/Sex: 1955/06/25 (66 y.o. M) Treating RN: Carlene Coria Primary Care Daphney Hopke: [name] Other Clinician: Referring Abir Eroh: [name] Treating Delson Dulworth/Extender: Yaakov Guthrie in Treatment: 5 Encounter Discharge Information Items Post Procedure Vitals Discharge Condition: Stable Temperature (F): 97.8 Ambulatory Status: Ambulatory Pulse (bpm): 70 Discharge Destination: Home Respiratory Rate (breaths/min): 18 Transportation: Private Auto Blood Pressure (mmHg): 112/69 Accompanied By: self Schedule Follow-up Appointment: Yes Clinical Summary of Care: Electronic Signature(s) Signed: 01/11/2022 2:33:39 PM By: Carlene Coria RN Entered By: Carlene Coria on 10/07/2021 13:12:29 Dorsainvil, [name] ([id number]) -------------------------------------------------------------------------------- Lower Extremity Assessment Details Patient Name: [name]. Date of Service: 10/07/2021 12:30 PM Medical Record Number: [id number] Patient Account Number: [id number] Date of Birth/Sex: 1955-11-17 (66 y.o. M) Treating RN: Carlene Coria Primary Care Marua Qin: [name] Other Clinician: Referring Rembert Browe: [name] Treating Zulema Pulaski/Extender: Yaakov Guthrie in Treatment: 5 Edema Assessment Assessed:  [Left: No] [Right: No] Edema: [Left: Ye] [Right: s] Calf Left: Right: Point of Measurement: 36 cm From Medial Instep 45 cm Ankle Left: Right: Point of Measurement: 12 cm From Medial Instep 28.5 cm Knee To Floor Left: Right: From Medial Instep 44 cm Vascular Assessment Pulses: Dorsalis Pedis Palpable: [Right:Yes] Electronic Signature(s) Signed: 01/11/2022 2:33:39 PM By: Carlene Coria RN Entered By: Carlene Coria on 10/07/2021 13:08:47 Madonna, [name] ([id number]) -------------------------------------------------------------------------------- Multi Wound Chart Details Patient Name: [name]. Date of Service: 10/07/2021 12:30 PM Medical Record Number: [id number] Patient Account Number: [id number] Date of Birth/Sex: [birth year] (66 y.o. M) Treating RN: Carlene Coria Primary Care Marina Boerner: [name] Other Clinician: Referring Lief Palmatier: [name] Treating Donica Derouin/Extender: Yaakov Guthrie in Treatment: 5 Vital Signs Height(in): 71 Pulse(bpm): 70 Weight(lbs): 536 Blood Pressure(mmHg): 112/69 Body Mass Index(BMI): 41.1 Temperature(F): 97.8 Respiratory Rate(breaths/min): 18 Photos: [N/A:N/A] Wound Location: Right, Circumferential Lower Leg N/A N/A Wounding Event: Gradually Appeared N/A N/A Primary Etiology: Lymphedema N/A N/A Comorbid History: Chronic Obstructive Pulmonary N/A N/A Disease (COPD), Arrhythmia, Congestive Heart Failure, Type II Diabetes Date Acquired: 08/03/2021 N/A N/A Weeks of Treatment: 5 N/A N/A Wound Status: Open N/A N/A Wound Recurrence: No N/A N/A Measurements L x W x D (cm) 5x2.5x0.1 N/A N/A Area (cm) : 9.817 N/A N/A Volume (cm) : 0.982 N/A N/A % Reduction in Area: 97.50% N/A N/A % Reduction in Volume: 97.50% N/A N/A Classification: Full Thickness Without Exposed N/A N/A Support Structures Exudate Amount: Medium N/A N/A Exudate Type: Serosanguineous N/A N/A Exudate Color: red, brown N/A N/A Granulation Amount:  None Present (0%) N/A N/A Necrotic Amount: Large (67-100%) N/A N/A Exposed Structures: Fat Layer (Subcutaneous Tissue): N/A N/A Yes Fascia: No Tendon: No Muscle: No Joint: No Bone: No Epithelialization: Small (1-33%) N/A N/A Treatment Notes Electronic Signature(s) Signed: 01/11/2022 2:33:39 PM By: Carlene Coria RN Entered By: Carlene Coria on 10/07/2021 12:47:33 Hooton, [name] ([id number]) -------------------------------------------------------------------------------- [place] Details Patient Name: [name]. Date of Service: 10/07/2021 12:30 PM Medical Record Number: [id number] Patient Account Number: [id number] Date of Birth/Sex: [birth year] (66 y.o. M) Treating RN: Carlene Coria Primary Care Melvin Whiteford: [name] Other Clinician: Referring Tiesha Marich: [name] Treating Matilda Fleig/Extender: Yaakov Guthrie in Treatment: 5 Active Inactive Abuse / Safety / Falls / Self Care Management  Nursing Diagnoses: Potential for falls Goals: Patient will remain injury free related to falls Date Initiated: 09/02/2021 Target Resolution Date: 10/03/2021 Goal Status: Active Interventions: Assess Activities of Daily Living upon admission and as needed Assess fall risk on admission and as needed Assess: immobility, friction, shearing, incontinence upon admission and as needed Assess impairment of mobility on admission and as needed per policy Assess personal safety and home safety (as indicated) on admission and as needed Assess self care needs on admission and as needed Notes: Necrotic Tissue Nursing Diagnoses: Impaired tissue integrity related to necrotic/devitalized tissue Knowledge deficit related to management of necrotic/devitalized tissue Goals: Necrotic/devitalized tissue will be minimized in the wound bed Date Initiated: 09/23/2021 Target Resolution Date: 09/23/2021 Goal Status: Active Patient/caregiver will verbalize understanding  of reason and process for debridement of necrotic tissue Date Initiated: 09/23/2021 Target Resolution Date: 09/23/2021 Goal Status: Active Interventions: Assess patient pain level pre-, during and post procedure and prior to discharge Provide education on necrotic tissue and debridement process Treatment Activities: Excisional debridement : 09/23/2021 Notes: Nutrition Nursing Diagnoses: Impaired glucose control: actual or potential Goals: Patient/caregiver will maintain therapeutic glucose control Date Initiated: 09/02/2021 Target Resolution Date: 10/03/2021 Goal Status: Active Interventions: WINDSOR, GOEKEN ([id number]) Assess HgA1c results as ordered upon admission and as needed Assess patient nutrition upon admission and as needed per policy Notes: Wound/Skin Impairment Nursing Diagnoses: Knowledge deficit related to ulceration/compromised skin integrity Goals: Patient/caregiver will verbalize understanding of skin care regimen Date Initiated: 09/02/2021 Target Resolution Date: 10/03/2021 Goal Status: Active Ulcer/skin breakdown will have a volume reduction of 30% by week 4 Date Initiated: 09/02/2021 Target Resolution Date: 10/03/2021 Goal Status: Active Ulcer/skin breakdown will have a volume reduction of 50% by week 8 Date Initiated: 09/02/2021 Target Resolution Date: 11/02/2021 Goal Status: Active Ulcer/skin breakdown will have a volume reduction of 80% by week 12 Date Initiated: 09/02/2021 Target Resolution Date: 12/03/2021 Goal Status: Active Ulcer/skin breakdown will heal within 14 weeks Date Initiated: 09/02/2021 Target Resolution Date: 01/03/2022 Goal Status: Active Interventions: Assess patient/caregiver ability to obtain necessary supplies Assess patient/caregiver ability to perform ulcer/skin care regimen upon admission and as needed Assess ulceration(s) every visit Notes: Electronic Signature(s) Signed: 01/11/2022 2:33:39 PM By: Carlene Coria RN Entered By:  Carlene Coria on 10/07/2021 12:47:27 Mckenny, [name] ([id number]) -------------------------------------------------------------------------------- Pain Assessment Details Patient Name: [name]. Date of Service: 10/07/2021 12:30 PM Medical Record Number: [id number] Patient Account Number: [id number] Date of Birth/Sex: 07/05/1955 (66 y.o. M) Treating RN: Carlene Coria Primary Care Bode Pieper: [name] Other Clinician: Referring Anabela Crayton: [name] Treating Lucillie Kiesel/Extender: Yaakov Guthrie in Treatment: 5 Active Problems Location of Pain Severity and Description of Pain Patient Has Paino No Site Locations Pain Management and Medication Current Pain Management: Electronic Signature(s) Signed: 01/11/2022 2:33:39 PM By: Carlene Coria RN Entered By: Carlene Coria on 10/07/2021 12:44:02 [place], [name] ([id number]) -------------------------------------------------------------------------------- Patient/Caregiver Education Details Patient Name: [name]. Date of Service: 10/07/2021 12:30 PM Medical Record Number: [id number] Patient Account Number: [id number] Date of Birth/Gender: 1955-05-24 (66 y.o. M) Treating RN: Carlene Coria Primary Care Physician: [name] Other Clinician: Referring Physician: [name] Treating Physician/Extender: Yaakov Guthrie in Treatment: 5 Education Assessment Education Provided To: Patient Education Topics Provided Wound Debridement: Methods: Explain/Verbal Responses: State content correctly Electronic Signature(s) Signed: 01/11/2022 2:33:39 PM By: Carlene Coria RN Entered By: Carlene Coria on 10/07/2021 13:11:36 Yonts, [name] ([id number]) -------------------------------------------------------------------------------- Wound Assessment Details Patient Name: [name]. Date of Service: 10/07/2021 12:30 PM Medical Record Number: [id number] Patient  Account Number:  [id number] Date of Birth/Sex: [birth year] (66 y.o. M) Treating RN: Carlene Coria Primary Care Jonothan Heberle: [name] Other Clinician: Referring Heston Widener: [name] Treating Corretta Munce/Extender: Yaakov Guthrie in Treatment: 5 Wound Status Wound Number: 1 Primary Lymphedema Etiology: Wound Location: Right, Circumferential Lower Leg Wound Open Wounding Event: Gradually Appeared Status: Date Acquired: 08/03/2021 Comorbid Chronic Obstructive Pulmonary Disease (COPD), Weeks Of Treatment: 5 History: Arrhythmia, Congestive Heart Failure, Type II Diabetes Clustered Wound: No Photos Wound Measurements Length: (cm) 5 Width: (cm) 2.5 Depth: (cm) 0.1 Area: (cm) 9.817 Volume: (cm) 0.982 % Reduction in Area: 97.5% % Reduction in Volume: 97.5% Epithelialization: Small (1-33%) Tunneling: No Undermining: No Wound Description Classification: Full Thickness Without Exposed Support Structu Exudate Amount: Medium Exudate Type: Serosanguineous Exudate Color: red, brown res Foul Odor After Cleansing: No Slough/Fibrino Yes Wound Bed Granulation Amount: None Present (0%) Exposed Structure Necrotic Amount: Large (67-100%) Fascia Exposed: No Necrotic Quality: Adherent Slough Fat Layer (Subcutaneous Tissue) Exposed: Yes Tendon Exposed: No Muscle Exposed: No Joint Exposed: No Bone Exposed: No Electronic Signature(s) Signed: 01/11/2022 2:33:39 PM By: Carlene Coria RN Entered By: Carlene Coria on 10/07/2021 12:46:55 Eilert, [name] ([id number]) -------------------------------------------------------------------------------- Vitals Details Patient Name: [name]. Date of Service: 10/07/2021 12:30 PM Medical Record Number: [id number] Patient Account Number: [id number] Date of Birth/Sex: 1955/12/17 (66 y.o. M) Treating RN: Carlene Coria Primary Care Taher Vannote: [name] Other Clinician: Referring Keric Zehren: [name] Treating Charleigh Correnti/Extender: Yaakov Guthrie in Treatment: 5 Vital Signs Time Taken: 12:43 Temperature (F): 97.8 Height (in): 71 Pulse (bpm): 70 Weight (lbs): 295 Respiratory Rate (breaths/min): 18 Body Mass Index (BMI): 41.1 Blood Pressure (mmHg): 112/69 Reference Range: 80 - 120 mg / dl Electronic Signature(s) Signed: 01/11/2022 2:33:39 PM By: Carlene Coria RN Entered By: Carlene Coria on 10/07/2021 12:43:25

## 2022-01-11 NOTE — Progress Notes (Signed)
[name], [name] ([id number]) Visit Report for 10/07/2021 Chief Complaint Document Details Patient Name: [name]. Date of Service: 10/07/2021 12:30 PM Medical Record Number: [id number] Patient Account Number: [id number] Date of Birth/Sex: [birth year] (66 y.o. M) Treating RN: Carlene Coria Primary Care Provider: [name] Other Clinician: Referring Provider: [name] Treating Provider/Extender: Yaakov Guthrie in Treatment: 5 Information Obtained from: Patient Chief Complaint 09/02/2021; right lower extremity wound Electronic Signature(s) Signed: 10/07/2021 1:49:53 PM By: Kalman Shan DO Entered By: Kalman Shan on 10/07/2021 13:13:57 Roe, [name] ([id number]) -------------------------------------------------------------------------------- Debridement Details Patient Name: [name]. Date of Service: 10/07/2021 12:30 PM Medical Record Number: [id number] Patient Account Number: [id number] Date of Birth/Sex: [birth year] (66 y.o. M) Treating RN: Carlene Coria Primary Care Provider: [name] Other Clinician: Referring Provider: [name] Treating Provider/Extender: Yaakov Guthrie in Treatment: 5 Debridement Performed for Wound #1 Right,Circumferential Lower Leg Assessment: Performed By: Physician Kalman Shan, MD Debridement Type: Debridement Level of Consciousness (Pre- Awake and Alert procedure): Pre-procedure Verification/Time Out Yes - 13:05 Taken: Start Time: 13:05 Total Area Debrided (L x W): 5 (cm) x 2.5 (cm) = 12.5 (cm) Tissue and other material Viable, Non-Viable, Slough, Subcutaneous, Slough debrided: Level: Skin/Subcutaneous Tissue Debridement Description: Excisional Instrument: Curette Bleeding: Minimum Hemostasis Achieved: Pressure End Time: 13:08 Procedural Pain: 0 Post Procedural Pain: 0 Response to Treatment: Procedure was tolerated well Level of Consciousness (Post- Awake  and Alert procedure): Post Debridement Measurements of Total Wound Length: (cm) 5 Width: (cm) 2.5 Depth: (cm) 0.1 Volume: (cm) 0.982 Character of Wound/Ulcer Post Debridement: Improved Post Procedure Diagnosis Same as Pre-procedure Electronic Signature(s) Signed: 10/07/2021 1:49:53 PM By: Kalman Shan DO Signed: 01/11/2022 2:33:39 PM By: Carlene Coria RN Entered By: Carlene Coria on 10/07/2021 13:06:43 Stettner, [name] ([id number]) -------------------------------------------------------------------------------- HPI Details Patient Name: [name]. Date of Service: 10/07/2021 12:30 PM Medical Record Number: [id number] Patient Account Number: [id number] Date of Birth/Sex: 1955-09-26 (66 y.o. M) Treating RN: Carlene Coria Primary Care Provider: [name] Other Clinician: Referring Provider: [name] Treating Provider/Extender: Yaakov Guthrie in Treatment: 5 History of Present Illness HPI Description: Admission 09/02/2021 [name] is a 66 year old male with a past medical history of A-fib, COPD, and OSA that presents the clinic for a 1 month history of nonhealing wounds to the right lower extremity. He states he had increased swelling to his lower extremities despite being on Lasix 40 mg twice daily About 4 weeks ago. He developed blisters and these opened into wounds To both legs. He states that his Lasix was increased to 80 mg twice daily and this helped significantly with his swelling and wound healing. He currently only has wounds to the right lower extremity that are weeping. He has been keeping the areas covered. He currently denies systemic signs of infection. He denies shortness of breath, orthopnea, fever/chills, nausea/vomiting 5/17; patient presents for follow-up. He states that the day after the wrap was placed it slid down and he took it off. He has had no compression on the leg for almost a week. He states he was busy and did not  call our office to be rewrapped. Currently denies signs of infection. 5/24 ;Patient presents for follow-up. He states that the compression wrap came off 2 days after it was placed in office. He states that there was too much drainage and it went through the wrap. He has been keeping the area covered. He did not want to call and come back in to get it really wrapped. He  denies signs of infection. 5/31; patient presents for follow-up. He states that the wrap had stayed in place until about 1 hour before he came in to the clinic because he wanted to take a shower. He reports improvement to wound healing. 09-30-2021 upon evaluation today patient appears to be doing well currently in regard to his wound in fact he is making excellent progress is measuring much smaller. Fortunately I do not see any evidence of active infection at this time and though he does not keep the wrap on the whole time it seems that it starts to unravel as he probably has restless leg syndrome and once the outer portion comes off then the Coban gets stuck in his sheets and eventually it just pulls down. Nonetheless I do believe that we might be able to try the pantyhose over instead of the cloth to see if this will do better as far as staying in place and preventing it from coming loose. 6/14; patient presents for follow-up. He tolerated the compression wrap well however took it off yesterday so he can take a shower. He has been keeping the area covered. He denies signs of infection. He has compression stockings but states these are difficult to put on. He does not own a juxta light but would like [place] to order one. Electronic Signature(s) Signed: 10/07/2021 1:49:53 PM By: Kalman Shan DO Entered By: Kalman Shan on 10/07/2021 13:14:43 Yarrow, [name] ([id number]) -------------------------------------------------------------------------------- Physical Exam Details Patient Name: [name]. Date of Service:  10/07/2021 12:30 PM Medical Record Number: [id number] Patient Account Number: [id number] Date of Birth/Sex: [birth year] (66 y.o. M) Treating RN: Carlene Coria Primary Care Provider: [name] Other Clinician: Referring Provider: [name] Treating Provider/Extender: Yaakov Guthrie in Treatment: 5 Constitutional . Cardiovascular . Psychiatric . Notes Right lower extremity: To the posterior aspect there with dried lymph fluid. Postdebridement there was slough and then granulation tissue. There is actually A cluster of very small scattered open wounds to the back of the leg. No signs of surrounding infection. 2+ pitting edema to the knee. Electronic Signature(s) Signed: 10/07/2021 1:49:53 PM By: Kalman Shan DO Entered By: Kalman Shan on 10/07/2021 13:19:23 Taglieri, [name] ([id number]) -------------------------------------------------------------------------------- Physician Orders Details Patient Name: [name]. Date of Service: 10/07/2021 12:30 PM Medical Record Number: [id number] Patient Account Number: [id number] Date of Birth/Sex: 1955/07/04 (66 y.o. M) Treating RN: Carlene Coria Primary Care Provider: [name] Other Clinician: Referring Provider: [name] Treating Provider/Extender: Yaakov Guthrie in Treatment: 5 Verbal / Phone Orders: No Diagnosis Coding Follow-up Appointments o Return Appointment in 1 week. Bathing/ Shower/ Hygiene o May shower with wound dressing protected with water repellent cover or cast protector. Edema Control - Lymphedema / Segmental Compressive Device / Other o Elevate, Exercise Daily and Avoid Standing for Long Periods of Time. o Elevate legs to the level of the heart and pump ankles as often as possible o Elevate leg(s) parallel to the floor when sitting. Wound Treatment Wound #1 - Lower Leg Wound Laterality: Right, Circumferential Cleanser: Soap and Water 1 x Per  Week/30 Days Discharge Instructions: Gently cleanse wound with antibacterial soap, rinse and pat dry prior to dressing wounds Topical: Gentamicin 1 x Per Week/30 Days Discharge Instructions: Apply as directed by provider. Topical: Mupirocin Ointment 1 x Per Week/30 Days Discharge Instructions: Apply as directed by provider. Primary Dressing: Silvercel 4 1/4x 4 1/4 (in/in) 1 x Per Week/30 Days Discharge Instructions: Apply Silvercel 4 1/4x 4 1/4 (in/in) as instructed Secondary  Dressing: (NON-BORDER) Zetuvit Plus Silicone NON-BORDER 5x5 (in/in) 1 x Per Week/30 Days Discharge Instructions: Please do not put silicone bordered dressings under wraps. Use non-bordered dressing only under wraps. Compression Wrap: 3-LAYER WRAP - Profore Lite LF 3 Multilayer Compression Bandaging System 1 x Per Week/30 Days Discharge Instructions: Apply 3 multi-layer wrap as prescribed. Compression Stockings: Circaid Juxta Lite Compression Wrap (DME) Right Leg Compression Amount: 20-30 mmHG Discharge Instructions: Apply Circaid Juxta Lite Compression Wrap as directed Electronic Signature(s) Signed: 10/07/2021 1:49:53 PM By: Kalman Shan DO Entered By: Kalman Shan on 10/07/2021 13:21:30 Larrick, [name] ([id number]) -------------------------------------------------------------------------------- Problem List Details Patient Name: [name]. Date of Service: 10/07/2021 12:30 PM Medical Record Number: [id number] Patient Account Number: [id number] Date of Birth/Sex: 1955/07/30 (66 y.o. 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/02/2021 No Yes exposed I87.321 Chronic venous hypertension  (idiopathic) with inflammation of right 09/02/2021 No Yes lower extremity I89.0 Lymphedema, not elsewhere classified 09/02/2021 No Yes E11.622 Type 2 diabetes mellitus with other skin ulcer 09/02/2021 No Yes J44.9 Chronic obstructive pulmonary disease, unspecified 09/02/2021 No Yes I48.91 Unspecified atrial fibrillation 09/02/2021 No Yes Z79.01 Long term (current) use of anticoagulants 09/02/2021 No Yes Inactive Problems Resolved Problems Electronic Signature(s) Signed: 10/07/2021 1:49:53 PM By: Kalman Shan DO Entered By: Kalman Shan on 10/07/2021 13:13:53 Harshberger, [name] ([id number]) -------------------------------------------------------------------------------- Progress Note Details Patient Name: [name]. Date of Service: 10/07/2021 12:30 PM Medical Record Number: [id number] Patient Account Number: [id number] Date of Birth/Sex: [birth year] (66 y.o. M) Treating RN: Carlene Coria Primary Care Provider: [name] Other Clinician: Referring Provider: [name] Treating Provider/Extender: Yaakov Guthrie in Treatment: 5 Subjective Chief Complaint Information obtained from Patient 09/02/2021; right lower extremity wound History of Present Illness (HPI) Admission 09/02/2021 Mr. Teon Hudnall is a 66 year old male with a past medical history of A-fib, COPD, and OSA that presents the clinic for a 1 month history of nonhealing wounds to the right lower extremity. He states he had increased swelling to his lower extremities despite being on Lasix 40 mg twice daily About 4 weeks ago. He developed blisters and these opened into wounds To both legs. He states that his Lasix was increased to 80 mg twice daily and this helped significantly with his swelling and wound healing. He currently only has wounds to the right lower extremity that are weeping. He has been keeping the areas covered. He currently denies systemic signs of infection. He denies shortness of  breath, orthopnea, fever/chills, nausea/vomiting 5/17; patient presents for follow-up. He states that the day after the wrap was placed it slid down and he took it off. He has had no compression on the leg for almost a week. He states he was busy and did not call our office to be rewrapped. Currently denies signs of infection. 5/24 ;Patient presents for follow-up. He states that the compression wrap came off 2 days after it was placed in office. He states that there was too much drainage and it went through the wrap. He has been keeping the area covered. He did not want to call and come back in to get it really wrapped. He denies signs of infection. 5/31; patient presents for follow-up. He states that the wrap had stayed in place until about 1 hour before  he came in to the clinic because he wanted to take a shower. He reports improvement to wound healing. 09-30-2021 upon evaluation today patient appears to be doing well currently in regard to his wound in fact he is making excellent progress is measuring much smaller. Fortunately I do not see any evidence of active infection at this time and though he does not keep the wrap on the whole time it seems that it starts to unravel as he probably has restless leg syndrome and once the outer portion comes off then the Coban gets stuck in his sheets and eventually it just pulls down. Nonetheless I do believe that we might be able to try the pantyhose over instead of the cloth to see if this will do better as far as staying in place and preventing it from coming loose. 6/14; patient presents for follow-up. He tolerated the compression wrap well however took it off yesterday so he can take a shower. He has been keeping the area covered. He denies signs of infection. He has compression stockings but states these are difficult to put on. He does not own a juxta light but would like [place] to order one. Objective Constitutional Vitals Time Taken: 12:43 PM, Height:  71 in, Weight: 295 lbs, BMI: 41.1, Temperature: 97.8 F, Pulse: 70 bpm, Respiratory Rate: 18 breaths/min, Blood Pressure: 112/69 mmHg. General Notes: Right lower extremity: To the posterior aspect there with dried lymph fluid. Postdebridement there was slough and then granulation tissue. There is actually A cluster of very small scattered open wounds to the back of the leg. No signs of surrounding infection. 2+ pitting edema to the knee. Integumentary (Hair, Skin) Wound #1 status is Open. Original cause of wound was Gradually Appeared. The date acquired was: 08/03/2021. The wound has been in treatment 5 weeks. The wound is located on the Right,Circumferential Lower Leg. The wound measures 5cm length x 2.5cm width x 0.1cm depth; 9.817cm^2 area and 0.982cm^3 volume. There is Fat Layer (Subcutaneous Tissue) exposed. There is no tunneling or undermining noted. There is a medium amount of serosanguineous drainage noted. There is no granulation within the wound bed. There is a large (67-100%) amount of necrotic tissue within the wound bed including Adherent Slough. 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 I debrided nonviable tissue. No surrounding signs of infection. I recommended continuing with silver alginate under 3 layer compression. I am hopeful he will be healed up in the next 1 to 2 weeks. We will order a juxta light compression to have to use daily once he is discharged. Procedures Wound #1 Pre-procedure diagnosis of Wound #1 is a Lymphedema located on  the Right,Circumferential Lower Leg . There was a Excisional Skin/Subcutaneous Tissue Debridement with a total area of 12.5 sq cm performed by Kalman Shan, MD. With the following instrument(s): Curette to remove Viable and Non-Viable tissue/material. Material removed includes Subcutaneous Tissue and Slough and. No specimens were taken. A time out was conducted at 13:05, prior to the start of the procedure. A Minimum amount of bleeding was controlled with Pressure. The procedure was tolerated well with a pain level of 0 throughout and a pain level of 0 following  the procedure. Post Debridement Measurements: 5cm length x 2.5cm width x 0.1cm depth; 0.982cm^3 volume. Character of Wound/Ulcer Post Debridement is improved. Post procedure Diagnosis Wound #1: Same as Pre-Procedure Plan Follow-up Appointments: Return Appointment in 1 week. Bathing/ Shower/ Hygiene: May shower with wound dressing protected with water repellent cover or cast protector. Edema Control - Lymphedema / Segmental Compressive Device / Other: Elevate, Exercise Daily and Avoid Standing for Long Periods of Time. Elevate legs to the level of the heart and pump ankles as often as possible Elevate leg(s) parallel to the floor when sitting. WOUND #1: - Lower Leg Wound Laterality: Right, Circumferential Cleanser: Soap and Water 1 x Per Week/30 Days Discharge Instructions: Gently cleanse wound with antibacterial soap, rinse and pat dry prior to dressing wounds Topical: Gentamicin 1 x Per Week/30 Days Discharge Instructions: Apply as directed by provider. Topical: Mupirocin Ointment 1 x Per Week/30 Days Discharge Instructions: Apply as directed by provider. Primary Dressing: Silvercel 4 1/4x 4 1/4 (in/in) 1 x Per Week/30 Days Discharge Instructions: Apply Silvercel 4 1/4x 4 1/4 (in/in) as instructed Secondary Dressing: (NON-BORDER) Zetuvit Plus Silicone NON-BORDER 5x5 (in/in) 1 x Per Week/30 Days Discharge Instructions: Please  do not put silicone bordered dressings under wraps. Use non-bordered dressing only under wraps. Compression Wrap: 3-LAYER WRAP - Profore Lite LF 3 Multilayer Compression Bandaging System 1 x Per Week/30 Days Discharge Instructions: Apply 3 multi-layer wrap as prescribed. Compression Stockings: Circaid Juxta Lite Compression Wrap (DME) Compression Amount: 20-30 mmHg (right) Discharge Instructions: Apply Circaid Juxta Lite Compression Wrap as directed 1. In office sharp debridement 2. Silver alginate under 3 layer compression 3. Follow-up in 1 week DEREC, MOZINGO ([id number]) Electronic Signature(s) Signed: 10/07/2021 1:49:53 PM By: Kalman Shan DO Entered By: Kalman Shan on 10/07/2021 13:21:04 Yazdani, [name] ([id number]) -------------------------------------------------------------------------------- SuperBill Details Patient Name: [name]. Date of Service: 10/07/2021 Medical Record Number: [id number] Patient Account Number: [id number] Date of Birth/Sex: [birth year] (66 y.o. M) Treating RN: Carlene Coria Primary Care Provider: [name] Other Clinician: Referring Provider: [name] Treating Provider/Extender: Yaakov Guthrie in Treatment: 5 Diagnosis Coding ICD-10 Codes Code Description [phone] Non-pressure chronic ulcer of other part of right lower leg with fat layer exposed I87.321 Chronic venous hypertension (idiopathic) with inflammation of right lower extremity I89.0 Lymphedema, not elsewhere classified E11.622 Type 2 diabetes mellitus with other skin ulcer J44.9 Chronic obstructive pulmonary disease, unspecified I48.91 Unspecified atrial fibrillation Z79.01 Long term (current) use of anticoagulants Facility Procedures CPT4 Code: [id number] Description: 11042 - DEB SUBQ TISSUE 20 SQ CM/< Modifier: Quantity: 1 CPT4 Code: Description: ICD-10 Diagnosis Description L97.812 Non-pressure chronic ulcer of other part of right lower leg  with fat la I87.321 Chronic venous hypertension (idiopathic) with inflammation of right low I89.0 Lymphedema, not elsewhere classified E11.622  Type 2 diabetes mellitus with other skin ulcer Modifier: yer exposed er extremity Quantity: Physician Procedures CPT4 Code: [id number] Description: 11042 - WC PHYS SUBQ TISS 20 SQ CM Modifier: Quantity: 1 CPT4 Code: Description: ICD-10 Diagnosis Description L97.812 Non-pressure chronic ulcer of other part of right lower leg with fat la I87.321 Chronic venous hypertension (idiopathic) with inflammation of right low I89.0 Lymphedema, not elsewhere classified E11.622  Type 2 diabetes mellitus with other skin ulcer Modifier: yer exposed er extremity Quantity: Electronic Signature(s) Signed: 10/07/2021 1:49:53 PM By: Kalman Shan DO Entered By: Kalman Shan on 10/07/2021 13:21:20

## 2022-01-12 ENCOUNTER — Ambulatory Visit (INDEPENDENT_AMBULATORY_CARE_PROVIDER_SITE_OTHER): Payer: Medicare Other | Admitting: Family Medicine

## 2022-01-12 ENCOUNTER — Encounter: Payer: Self-pay | Admitting: Family Medicine

## 2022-01-12 VITALS — BP 136/72 | HR 75 | Temp 98.0°F | Ht 71.0 in | Wt 280.1 lb

## 2022-01-12 DIAGNOSIS — D689 Coagulation defect, unspecified: Secondary | ICD-10-CM | POA: Diagnosis not present

## 2022-01-12 DIAGNOSIS — J9622 Acute and chronic respiratory failure with hypercapnia: Secondary | ICD-10-CM

## 2022-01-12 DIAGNOSIS — I5033 Acute on chronic diastolic (congestive) heart failure: Secondary | ICD-10-CM

## 2022-01-12 DIAGNOSIS — J9621 Acute and chronic respiratory failure with hypoxia: Secondary | ICD-10-CM | POA: Diagnosis not present

## 2022-01-12 DIAGNOSIS — I10 Essential (primary) hypertension: Secondary | ICD-10-CM

## 2022-01-12 DIAGNOSIS — L03115 Cellulitis of right lower limb: Secondary | ICD-10-CM

## 2022-01-12 DIAGNOSIS — I4819 Other persistent atrial fibrillation: Secondary | ICD-10-CM | POA: Diagnosis not present

## 2022-01-12 DIAGNOSIS — K760 Fatty (change of) liver, not elsewhere classified: Secondary | ICD-10-CM

## 2022-01-12 DIAGNOSIS — L03116 Cellulitis of left lower limb: Secondary | ICD-10-CM

## 2022-01-12 DIAGNOSIS — I50810 Right heart failure, unspecified: Secondary | ICD-10-CM | POA: Diagnosis not present

## 2022-01-12 DIAGNOSIS — Z23 Encounter for immunization: Secondary | ICD-10-CM | POA: Diagnosis not present

## 2022-01-12 DIAGNOSIS — J441 Chronic obstructive pulmonary disease with (acute) exacerbation: Secondary | ICD-10-CM | POA: Diagnosis not present

## 2022-01-12 DIAGNOSIS — K429 Umbilical hernia without obstruction or gangrene: Secondary | ICD-10-CM

## 2022-01-12 DIAGNOSIS — I5032 Chronic diastolic (congestive) heart failure: Secondary | ICD-10-CM

## 2022-01-12 DIAGNOSIS — G4733 Obstructive sleep apnea (adult) (pediatric): Secondary | ICD-10-CM

## 2022-01-12 DIAGNOSIS — J449 Chronic obstructive pulmonary disease, unspecified: Secondary | ICD-10-CM | POA: Diagnosis not present

## 2022-01-12 DIAGNOSIS — A419 Sepsis, unspecified organism: Secondary | ICD-10-CM | POA: Diagnosis not present

## 2022-01-12 DIAGNOSIS — T2000XA Burn of unspecified degree of head, face, and neck, unspecified site, initial encounter: Secondary | ICD-10-CM

## 2022-01-12 DIAGNOSIS — E1169 Type 2 diabetes mellitus with other specified complication: Secondary | ICD-10-CM | POA: Diagnosis not present

## 2022-01-12 DIAGNOSIS — I7143 Infrarenal abdominal aortic aneurysm, without rupture: Secondary | ICD-10-CM | POA: Diagnosis not present

## 2022-01-12 LAB — COMPREHENSIVE METABOLIC PANEL
ALT: 32 U/L (ref 0–53)
AST: 28 U/L (ref 0–37)
Albumin: 3.8 g/dL (ref 3.5–5.2)
Alkaline Phosphatase: 109 U/L (ref 39–117)
BUN: 14 mg/dL (ref 6–23)
CO2: 44 mEq/L — ABNORMAL HIGH (ref 19–32)
Calcium: 9.5 mg/dL (ref 8.4–10.5)
Chloride: 82 mEq/L — ABNORMAL LOW (ref 96–112)
Creatinine, Ser: 0.9 mg/dL (ref 0.40–1.50)
GFR: 89.11 mL/min (ref >60.00)
Glucose, Bld: 221 mg/dL — ABNORMAL HIGH (ref 70–99)
Potassium: 4.4 mEq/L (ref 3.5–5.1)
Sodium: 131 mEq/L — ABNORMAL LOW (ref 135–145)
Total Bilirubin: 0.6 mg/dL (ref 0.2–1.2)
Total Protein: 6.8 g/dL (ref 6.0–8.3)

## 2022-01-12 LAB — CBC WITH DIFFERENTIAL/PLATELET
Basophils Absolute: 0.1 10*3/uL (ref 0.0–0.1)
Basophils Relative: 1.3 % (ref 0.0–3.0)
Eosinophils Absolute: 0.1 10*3/uL (ref 0.0–0.7)
Eosinophils Relative: 0.8 % (ref 0.0–5.0)
HCT: 36.2 % — ABNORMAL LOW (ref 39.0–52.0)
Hemoglobin: 10.8 g/dL — ABNORMAL LOW (ref 13.0–17.0)
Lymphocytes Relative: 7.4 % — ABNORMAL LOW (ref 12.0–46.0)
Lymphs Abs: 0.8 10*3/uL (ref 0.7–4.0)
MCHC: 30 g/dL (ref 30.0–36.0)
MCV: 74.1 fl — ABNORMAL LOW (ref 78.0–100.0)
Monocytes Absolute: 0.9 10*3/uL (ref 0.1–1.0)
Monocytes Relative: 8.1 % (ref 3.0–12.0)
Neutro Abs: 9.5 10*3/uL — ABNORMAL HIGH (ref 1.4–7.7)
Neutrophils Relative %: 82.4 % — ABNORMAL HIGH (ref 43.0–77.0)
Platelets: 264 10*3/uL (ref 150.0–400.0)
RBC: 4.89 Mil/uL (ref 4.22–5.81)
RDW: 23.9 % — ABNORMAL HIGH (ref 11.5–15.5)
WBC: 11.5 10*3/uL — ABNORMAL HIGH (ref 4.0–10.5)

## 2022-01-12 MED ORDER — FLUTICASONE PROPIONATE 50 MCG/ACT NA SUSP: 2.0000 | Freq: Every day | NASAL | 3 refills | Status: DC

## 2022-01-12 MED ORDER — IPRATROPIUM BROMIDE HFA 17 MCG/ACT IN AERS: 2.0000 | INHALATION_SPRAY | Freq: Four times a day (QID) | RESPIRATORY_TRACT | 12 refills | Status: DC | PRN

## 2022-01-12 MED ORDER — ALBUTEROL SULFATE HFA 108 (90 BASE) MCG/ACT IN AERS: 2.0000 | INHALATION_SPRAY | Freq: Four times a day (QID) | RESPIRATORY_TRACT | 6 refills | Status: AC | PRN

## 2022-01-12 NOTE — Patient Instructions (Addendum)
Flu shot today Labs today  For nasal passage - start using vaseline 2-3 times a day - this will help clear scabbing and protect lining of nose.  Continue lower dose carvedilol 3.'125mg'$  twice daily.  Wilder Glade started last month - in addition to amaryl and metformin. Wilder Glade [place] be through [place] patient [place] - I will check with Mendel Ryder our pharmacist about this. Keep appointment in 2 weeks.
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
## 2022-01-14 ENCOUNTER — Telehealth: Payer: Self-pay

## 2022-01-14 ENCOUNTER — Ambulatory Visit: Payer: Self-pay

## 2022-01-14 ENCOUNTER — Telehealth: Payer: Self-pay | Admitting: Family Medicine

## 2022-01-14 ENCOUNTER — Other Ambulatory Visit: Payer: Self-pay | Admitting: Family Medicine

## 2022-01-14 DIAGNOSIS — I25119 Atherosclerotic heart disease of native coronary artery with unspecified angina pectoris: Secondary | ICD-10-CM | POA: Diagnosis not present

## 2022-01-14 DIAGNOSIS — I252 Old myocardial infarction: Secondary | ICD-10-CM | POA: Diagnosis not present

## 2022-01-14 DIAGNOSIS — I4891 Unspecified atrial fibrillation: Secondary | ICD-10-CM | POA: Diagnosis not present

## 2022-01-14 DIAGNOSIS — T2000XA Burn of unspecified degree of head, face, and neck, unspecified site, initial encounter: Secondary | ICD-10-CM | POA: Insufficient documentation

## 2022-01-14 DIAGNOSIS — E1165 Type 2 diabetes mellitus with hyperglycemia: Secondary | ICD-10-CM | POA: Diagnosis not present

## 2022-01-14 DIAGNOSIS — D6869 Other thrombophilia: Secondary | ICD-10-CM | POA: Insufficient documentation

## 2022-01-14 DIAGNOSIS — G43909 Migraine, unspecified, not intractable, without status migrainosus: Secondary | ICD-10-CM | POA: Diagnosis not present

## 2022-01-14 DIAGNOSIS — M1990 Unspecified osteoarthritis, unspecified site: Secondary | ICD-10-CM | POA: Diagnosis not present

## 2022-01-14 DIAGNOSIS — I5033 Acute on chronic diastolic (congestive) heart failure: Secondary | ICD-10-CM | POA: Diagnosis not present

## 2022-01-14 DIAGNOSIS — J441 Chronic obstructive pulmonary disease with (acute) exacerbation: Secondary | ICD-10-CM | POA: Diagnosis not present

## 2022-01-14 DIAGNOSIS — I420 Dilated cardiomyopathy: Secondary | ICD-10-CM | POA: Diagnosis not present

## 2022-01-14 DIAGNOSIS — I5022 Chronic systolic (congestive) heart failure: Secondary | ICD-10-CM | POA: Diagnosis not present

## 2022-01-14 DIAGNOSIS — G4733 Obstructive sleep apnea (adult) (pediatric): Secondary | ICD-10-CM | POA: Diagnosis not present

## 2022-01-14 DIAGNOSIS — L97229 Non-pressure chronic ulcer of left calf with unspecified severity: Secondary | ICD-10-CM | POA: Diagnosis not present

## 2022-01-14 DIAGNOSIS — E876 Hypokalemia: Secondary | ICD-10-CM | POA: Diagnosis not present

## 2022-01-14 DIAGNOSIS — F32A Depression, unspecified: Secondary | ICD-10-CM | POA: Diagnosis not present

## 2022-01-14 DIAGNOSIS — I872 Venous insufficiency (chronic) (peripheral): Secondary | ICD-10-CM | POA: Diagnosis not present

## 2022-01-14 DIAGNOSIS — I110 Hypertensive heart disease with heart failure: Secondary | ICD-10-CM | POA: Diagnosis not present

## 2022-01-14 DIAGNOSIS — I50810 Right heart failure, unspecified: Secondary | ICD-10-CM | POA: Insufficient documentation

## 2022-01-14 DIAGNOSIS — E785 Hyperlipidemia, unspecified: Secondary | ICD-10-CM | POA: Diagnosis not present

## 2022-01-14 DIAGNOSIS — D689 Coagulation defect, unspecified: Secondary | ICD-10-CM | POA: Insufficient documentation

## 2022-01-14 DIAGNOSIS — L03116 Cellulitis of left lower limb: Secondary | ICD-10-CM | POA: Diagnosis not present

## 2022-01-14 DIAGNOSIS — L97829 Non-pressure chronic ulcer of other part of left lower leg with unspecified severity: Secondary | ICD-10-CM | POA: Diagnosis not present

## 2022-01-14 DIAGNOSIS — L03115 Cellulitis of right lower limb: Secondary | ICD-10-CM | POA: Diagnosis not present

## 2022-01-14 DIAGNOSIS — J9601 Acute respiratory failure with hypoxia: Secondary | ICD-10-CM | POA: Diagnosis not present

## 2022-01-14 DIAGNOSIS — Z5181 Encounter for therapeutic drug level monitoring: Secondary | ICD-10-CM | POA: Diagnosis not present

## 2022-01-14 DIAGNOSIS — Z9181 History of falling: Secondary | ICD-10-CM | POA: Diagnosis not present

## 2022-01-14 DIAGNOSIS — K219 Gastro-esophageal reflux disease without esophagitis: Secondary | ICD-10-CM | POA: Diagnosis not present

## 2022-01-14 LAB — POCT INR: INR: 3.3 — AB (ref 2.0–3.0)

## 2022-01-14 MED ORDER — CARVEDILOL 3.125 MG PO TABS: 3.1250 mg | ORAL_TABLET | Freq: Two times a day (BID) | ORAL | 1 refills | Status: DC

## 2022-01-14 MED ORDER — GLIMEPIRIDE 4 MG PO TABS: 4.0000 mg | ORAL_TABLET | Freq: Every day | ORAL | 1 refills | Status: DC

## 2022-01-14 MED ORDER — METFORMIN HCL 1000 MG PO TABS: 1000.0000 mg | ORAL_TABLET | Freq: Two times a day (BID) | ORAL | 1 refills | Status: DC

## 2022-01-14 NOTE — Assessment & Plan Note (Signed)
Poor control based on latest A1c. He doesn't think he's taking farxiga - will check with our pharmacist as he was approved for AZ&Me PAP.  He continues amaryl '4mg'$  daily and full dose metformin.

## 2022-01-14 NOTE — Assessment & Plan Note (Signed)
Treated in hospital with IV vanc/rocephin, transitioned to oral bactrim which he is finishing. This is resolved.

## 2022-01-14 NOTE — Patient Outreach (Signed)
  Care Coordination   Initial Visit Note   01/14/2022 Name: [name] MRN: [id number] DOB: Mar 01, 1956  [name] is a 66 y.o. year old male who sees [name], MD for primary care. I spoke with  [name] by phone today.  What matters to the patients health and wellness today?  Patient states he is unable to keep telephone appointment and request call back on another day.     Goals Addressed             This Visit's Progress    Develop management plan of care for COPD       Rescheduled patient for telephone outreach with case manager as requested        SDOH assessments and interventions completed:  No     Care Coordination Interventions Activated:  Yes  Care Coordination Interventions:  Yes, provided   Follow up plan: Follow up call scheduled for 01/20/22 at 1 pm    Encounter Outcome:  Pt. Visit Completed   Quinn Plowman RN,BSN,CCM [place] [phone] direct line

## 2022-01-14 NOTE — Progress Notes (Signed)
Patient ID: [name], male    DOB: 07/27/1955, 66 y.o.   MRN: [id number]  HPI  Mr Ambrosia is a 66 y/o male with a history of atrial fibrillation, narcolepsy, CAD, DM, hyperlipidemia, HTN, anemia, anxiety, COPD, OSA, previous tobacco use and chronic heart failure.   Echo report from 11/19/21 reviewed and showed an EF of 60-65% along with mild LVH/ LAE.  Admitted 01/03/22 due to leg pain, shortness of breath and pedal edema. Placed on non-rebreather mask due to hypoxia. Initially given IV lasix with transition to oral diuretics. Given antibiotics for sepsis. Needed solu-medrol and then changed to oral prednisone. Weaned to oxygen via nasal cannula. Discharged after 2 days. Was in the ED 12/28/21 due to chest wall pain where he was evaluated and released.    He presents today for his initial visit with a chief complaint of minimal shortness of breath with moderate exertion. Describes this as chronic in nature although is much better since his recent admission. He has associated fatigue, morning cough, pedal edema, abdominal distention, dizziness (with sudden position changes) and easy bruising along with this. Denies difficulty sleeping, palpitations, chest pain or weight gain.   Not adding salt to his food and weighing daily.   Drinking 2 pots of coffee, 16 ounces diet tea and 20 ounce snapple daily.   Past Medical History:  Diagnosis Date   Abnormal drug screen 2015   MJ positive x3 - one more and we will stop prescribing ativan (08/2013).   Anemia    Angina    Anxiety    Arrhythmia    atrial fibrillation   Arthritis    CAD (coronary artery disease)    nonobstructive   Cannabis abuse    Chronic systolic CHF (congestive heart failure) ([place]) 2013   NYHA Class II/III   COPD (chronic obstructive pulmonary disease) ([place])    Diabetes type 2, uncontrolled    declines DSME   Dilated cardiomyopathy ([place]) 2013   now improved   Ex-smoker    Frequent headaches    History of atrial  fibrillation 2013   chronic, s/p ablation prior on coumadin   Hyperlipidemia    Hypertension    Migraine    Narcolepsy    Nonischemic cardiomyopathy ([place]) 2013   EF 25% per Dr Nehemiah Massed   Obesity    OSA (obstructive sleep apnea)    does not use CPAP - unable to tolerate   Seasonal allergies    Past Surgical History:  Procedure Laterality Date   ATRIAL FLUTTER ABLATION N/A 09/21/2011   Procedure: ATRIAL FLUTTER ABLATION;  Surgeon: Thompson Grayer, MD;  Location: [facility] CATH LAB;  Service: Cardiovascular;  Laterality: N/A;   CARDIAC ELECTROPHYSIOLOGY [place] AND ABLATION  2013   for atrial flutter   CARDIOVASCULAR STRESS TEST  03/2012   ETT WNL Nehemiah Massed)   CARDIOVERSION N/A 08/12/2021   Procedure: CARDIOVERSION;  Surgeon: Corey Skains, MD;  Location: [facility] ORS;  Service: Cardiovascular;  Laterality: N/A;   COLONOSCOPY WITH PROPOFOL N/A 05/22/2020   TA, diverticulosis, descending colon ulcer biopsy WNL, int hem Allen Norris, Darren, MD)   ESOPHAGOGASTRODUODENOSCOPY (EGD) WITH PROPOFOL N/A 05/22/2020   WNL ([place])   US ECHOCARDIOGRAPHY  2014   EF 50%, nl LV fxn, RV nl size/function, mild mitral insuff   Family History  Problem Relation Age of Onset   Heart failure Father 45   Cancer Mother 67       NHL   Hypertension Maternal Grandfather    CAD Maternal  Grandfather 40       MI   Diabetes Other        grandparents   Cancer Brother 28       lung (smoker)   Social History   Tobacco Use   Smoking status: Former    Packs/day: 1.00    Years: 31.00    Total pack years: 31.00    Types: Cigarettes    Quit date: 04/26/2018    Years since quitting: 3.7   Smokeless tobacco: Never  Substance Use Topics   Alcohol use: No    Alcohol/week: 0.0 standard drinks of alcohol   Allergies  Allergen Reactions   Atorvastatin Other (See Comments)    Dizziness   Lisinopril Cough   Prior to Admission medications   Medication Sig Start Date End Date Taking? Authorizing Provider  ACCU-CHEK FASTCLIX  LANCETS MISC Check blood sugar once daily and as instructed. Dx 250.00 05/10/13  Yes [name], MD  albuterol (VENTOLIN HFA) 108 (90 Base) MCG/ACT inhaler Inhale 2 puffs into the lungs every 6 (six) hours as needed for wheezing or shortness of breath. 01/12/22  Yes [name], MD  amiodarone (PACERONE) 200 MG tablet Take 1 tablet (200 mg total) by mouth daily. 09/16/21  Yes Vickie Epley, MD  Blood Glucose Monitoring Suppl (BLOOD GLUCOSE MONITOR SYSTEM) W/DEVICE KIT by Does not apply route. Use to check sugar once daily and as needed Dx: E11.9 **ONE TOUCH VERIO**   Yes [provider]  Budeson-Glycopyrrol-Formoterol (BREZTRI AEROSPHERE) 160-9-4.8 MCG/ACT AERO Inhale 2 puffs into the lungs 2 (two) times daily. 10/23/21  Yes [name], MD  carvedilol (COREG) 3.125 MG tablet Take 1 tablet (3.125 mg total) by mouth 2 (two) times daily with a meal. 01/14/22  Yes [name], MD  Cholecalciferol (VITAMIN D) 50 MCG (2000 UT) CAPS Take 1 capsule (2,000 Units total) by mouth daily. 02/05/19  Yes [name], MD  citalopram (CELEXA) 10 MG tablet Take 1 tablet (10 mg total) by mouth daily. 07/20/21  Yes [name], MD  dapagliflozin propanediol (FARXIGA) 5 MG TABS tablet Take 1 tablet (5 mg total) by mouth daily before breakfast. 11/30/21  Yes [name], MD  Docusate Calcium (STOOL SOFTENER PO) Take 1 tablet by mouth daily.   Yes [provider]  fluticasone (FLONASE) 50 MCG/ACT nasal spray Place 2 sprays into both nostrils daily. 01/12/22  Yes [name], MD  furosemide (LASIX) 80 MG tablet Take 80 mg by mouth 2 (two) times daily. 12/25/21  Yes [provider]  glimepiride (AMARYL) 4 MG tablet Take 1 tablet (4 mg total) by mouth daily with breakfast. 01/14/22  Yes [name], MD  glucose blood [facility] VERIO) test strip Check blood sugar 3 times a day 10/22/21  Yes [name], MD  hydrOXYzine (ATARAX) 50 MG tablet TAKE 1  TABLET (50 MG TOTAL) BY MOUTH 2 (TWO) TIMES DAILY AS NEEDED FOR ANXIETY. 05/05/21  Yes [name], MD  ipratropium (ATROVENT HFA) 17 MCG/ACT inhaler Inhale 2 puffs into the lungs every 6 (six) hours as needed for wheezing. 01/12/22  Yes [name], MD  ipratropium-albuterol (DUONEB) 0.5-2.5 (3) MG/3ML SOLN Take 3 mLs by nebulization every 6 (six) hours as needed (severe shortness of breath/wheezing). 01/05/22  Yes Emeterio Reeve, DO  metFORMIN (GLUCOPHAGE) 1000 MG tablet Take 1 tablet (1,000 mg total) by mouth 2 (two) times daily with a meal. 01/14/22  Yes [name], MD  metolazone (ZAROXOLYN) 2.5 MG tablet Take 1 tablet (2.5 mg total)  by mouth 2 (two) times a week. Patient taking differently: Take 2.5 mg by mouth 2 (two) times a week. Saturday and wednesday 09/10/21  Yes [name], MD  montelukast (SINGULAIR) 10 MG tablet TAKE 1 TABLET BY MOUTH EVERYDAY AT BEDTIME 07/07/21  Yes [name], MD  NON FORMULARY 1,200 mg daily. SEA MOSS   Yes [provider]  NON FORMULARY daily. SUPER BEETS   Yes [provider]  NON FORMULARY 500 mg once. TURMERIC   Yes [provider]  pantoprazole (PROTONIX) 40 MG tablet TAKE 1 TABLET (40 MG TOTAL) BY MOUTH TWICE A DAY BEFORE MEALS 01/11/22  Yes [name], MD  potassium chloride SA (KLOR-CON M) 10 MEQ tablet Take 1 tablet (10 mEq total) by mouth daily. 01/15/22  Yes [name], MD  Probiotic Product (PROBIOTIC DAILY PO) Take 1 tablet by mouth daily.   Yes [provider]  simvastatin (ZOCOR) 20 MG tablet TAKE 1 TABLET BY MOUTH EVERY DAY IN THE EVENING 12/29/21  Yes [name], MD  vitamin B-12 (CYANOCOBALAMIN) 1000 MCG tablet Take 1 tablet (1,000 mcg total) by mouth every Monday, Wednesday, and Friday. Patient taking differently: Take 1,000 mcg by mouth daily. 07/22/21  Yes [name], MD  warfarin (COUMADIN) 5 MG tablet TAKE 1/2 TABLET BY MOUTH DAILY EXCEPT TAKE NOTHING ON  WEDNESDAYS OR AS DIRECTED BY ANTICOAGULATION CLINIC 11/23/21  Yes [name], MD   Review of Systems  Constitutional:  Positive for fatigue. Negative for appetite change.  HENT:  Positive for congestion. Negative for postnasal drip and sore throat.   Eyes: Negative.   Respiratory:  Positive for cough (early morning) and shortness of breath (improving). Negative for chest tightness.   Cardiovascular:  Positive for leg swelling (improving). Negative for chest pain and palpitations.  Gastrointestinal:  Positive for abdominal distention. Negative for abdominal pain.  Endocrine: Negative.   Genitourinary: Negative.   Musculoskeletal:  Positive for neck pain. Negative for back pain.  Skin: Negative.   Allergic/Immunologic: Negative.   Neurological:  Positive for dizziness (with sudden position changes) and light-headedness.  Hematological:  Negative for adenopathy. Bruises/bleeds easily.  Psychiatric/Behavioral:  Negative for dysphoric mood and sleep disturbance (sleeping on 2-3 pillows; oxygen at 2L at home and bedtime). The patient is not nervous/anxious.    Vitals:   01/15/22 1203  BP: 130/82  Pulse: 71  Resp: 14  SpO2: (!) 88%  Weight: 274 lb 6 oz (124.5 kg)  Height: _0  (1.803 m)   Wt Readings from Last 3 Encounters:  01/15/22 274 lb 6 oz (124.5 kg)  01/12/22 280 lb 2 oz (127.1 kg)  01/05/22 (!) 304 lb 3.8 oz (138 kg)   Lab Results  Component Value Date   CREATININE 0.90 01/12/2022   CREATININE 0.81 01/05/2022   CREATININE 1.05 01/04/2022   Physical Exam Vitals and nursing note reviewed.  Constitutional:      Appearance: He is well-developed.  HENT:     Head: Normocephalic and atraumatic.  Cardiovascular:     Rate and Rhythm: Normal rate and regular rhythm.  Pulmonary:     Effort: Pulmonary effort is normal.     Breath sounds: No wheezing, rhonchi or rales.  Abdominal:     Palpations: Abdomen is soft.     Tenderness: There is no abdominal tenderness.      Comments: Umbilical hernia present  Musculoskeletal:     Cervical back: Normal range of motion and neck supple.     Right lower leg: No  tenderness. Edema (1+ pitting) present.     Left lower leg: No tenderness. Edema (1+ pitting) present.  Skin:    General: Skin is warm and dry.  Neurological:     General: No focal deficit present.     Mental Status: He is alert and oriented to person, place, and time.  Psychiatric:        Mood and Affect: Mood normal.        Behavior: Behavior normal.    Assessment & Plan:  1: Chronic heart failure with preserved ejection fraction with structural changes (LVH/LAE)- - NYHA class II - euvolemic today - weighing daily; reminded to call for an overnight weight gain of > 2 pounds or a weekly weight gain of > 5 pounds - not adding salt and is reading food labels for sodium content - reviewed the importance of keeping daily fluid intake to 60 ounces / day; he's currently drinking 2 pots of coffee, 16 ounces diet tea and 20 ounces of snapple daily - saw cardiology Petra Kuba) 07/29/21 - on GDMT of farxiga (although 20m dose) - consider titrating this up and adding entresto if able - has worn compression socks in the past; encouraged him to resume wearing them daily with removal at bedtime; even if he only wears them for a short period of time it should help along with elevating his legs when sitting for long periods of time - BNP 01/03/22 was 289.4  2: HTN- - BP looks good (130/82) - saw PCP (Danise Mina 01/12/22 - BMP 01/12/22 reviewed and showed sodium 131, potassium 4.4, creatinine 0.9 and GFR 89.11  3: DM- - A1c 01/03/22 was 8.6% - home glucose today was 256  4: Atrial fibrillation- - saw EP (Quentin Ore 09/16/21  5: COPD- - room air pulse ox 88%; on 2L at home but didn't want [place] put it on him while he was here - wearing oxygen at 2L at home and at bedtime - has CPAP but says that it's old and doesn't work right - sees pulmonology ([place])  01/18/22   Medication list reviewed.   Return in 6 weeks, sooner if needed.

## 2022-01-14 NOTE — Assessment & Plan Note (Signed)
Continue breztri.

## 2022-01-14 NOTE — Assessment & Plan Note (Addendum)
Still having difficulty with CPAP mask. Has upcoming pulmonology appointment.

## 2022-01-14 NOTE — Assessment & Plan Note (Addendum)
Treated for acute on chronic CHF exacerbation with IV lasix, transitioned back to his home regimen of lasix '80mg'$  BID.  Reviewed low sodium diet.  He has also been referred to CHF clinic and has appt on Friday.  He has history of diastolic CHF as well as R heart failure/cor pulmonale due to significant pulmonary disease.

## 2022-01-14 NOTE — Patient Outreach (Signed)
  Care Coordination   01/14/2022 Name: [name] MRN: [id number] DOB: 13-Oct-1955   Care Coordination Outreach Attempts:  An unsuccessful telephone outreach was attempted for a scheduled appointment today.  Follow Up Plan:  Additional outreach attempts will be made to offer the patient care coordination information and services.   Encounter Outcome:  No Answer  Care Coordination Interventions Activated:  No   Care Coordination Interventions:  No, not indicated    Quinn Plowman RN,BSN,CCM [place] [phone] direct line

## 2022-01-14 NOTE — Assessment & Plan Note (Signed)
Due to BLE cellulitis. This is resolved after hospital treatment.

## 2022-01-14 NOTE — Telephone Encounter (Signed)
Contacted pt concerning [facility] coming to check his INR. He is not sure when they are coming to his home. Advised this nurse will f/u with [facility] nurse and if any problems will call him back.  Contacted [name] [phone]), HH nurse who reports he will be her last apt today and it could be after 5. She will call with INR result and leave on VM if necessary.

## 2022-01-14 NOTE — Assessment & Plan Note (Addendum)
Treated with bronchodilators, solumedrol transitioned to oral prednisone '40mg'$  daily, which he has since completed.  Upcoming pulmonology appointment scheduled for next week.  Recommended continue home oxygen therapy.

## 2022-01-14 NOTE — Assessment & Plan Note (Signed)
Discussed home oxygen use. He has not yet received portable oxygen concentrator.  Chronic hypoxic hypercapneic respiratory failure due to cor pulmonale due to untreated OSA, obesity, and chronic diastolic CHF.

## 2022-01-14 NOTE — Assessment & Plan Note (Addendum)
Occurred 1 wk ago, home care reviewed. rec vaseline use. Reviewed safe oxygen use.

## 2022-01-14 NOTE — Assessment & Plan Note (Signed)
BP stable on current regimen, will continue recent changes made (ie lower carvedilol dose)

## 2022-01-14 NOTE — Telephone Encounter (Signed)
[name] from [facility] called in stating that patient was discharged from hospital with Potassium for 7 days and no refills. She stated that Dr. Darnell Level had blood drawn for patient and wants to know what his potassium levels were and if he will need to take the potassium. She stated he is still taking the fluid pill. If he needs potassium another prescription will need to be put in. Please advise. Thank you!

## 2022-01-14 NOTE — Assessment & Plan Note (Signed)
Latest echo with evidence of this - anticipate cor pulmonale from lung disease.  Has CHF clinic and pulmonology appointments pending.

## 2022-01-14 NOTE — Assessment & Plan Note (Signed)
Continues amiodarone, carvedilol (lower dose) and coumadin.

## 2022-01-14 NOTE — Assessment & Plan Note (Signed)
S/p hospitalization with IV diuresis of 30+ lb with marked benefit. Back on home regimen of lasix '80mg'$  BID and metolazone 2.'5mg'$  twice weekly. He has upcoming CHF clinic appointment.

## 2022-01-14 NOTE — Assessment & Plan Note (Signed)
This is smaller after IV diuresis.

## 2022-01-14 NOTE — Assessment & Plan Note (Signed)
CT from earlier this month didn't show signs of HSM, no liver changes of cirrhosis. He did have ascites with anasarca likely from CHF.

## 2022-01-15 ENCOUNTER — Ambulatory Visit (INDEPENDENT_AMBULATORY_CARE_PROVIDER_SITE_OTHER): Payer: Medicare Other

## 2022-01-15 ENCOUNTER — Encounter: Payer: Self-pay | Admitting: Family

## 2022-01-15 ENCOUNTER — Ambulatory Visit: Payer: Medicare Other | Attending: Family | Admitting: Family

## 2022-01-15 VITALS — BP 130/82 | HR 71 | Resp 14 | Ht 71.0 in | Wt 274.4 lb

## 2022-01-15 DIAGNOSIS — I110 Hypertensive heart disease with heart failure: Secondary | ICD-10-CM | POA: Insufficient documentation

## 2022-01-15 DIAGNOSIS — I10 Essential (primary) hypertension: Secondary | ICD-10-CM

## 2022-01-15 DIAGNOSIS — G4733 Obstructive sleep apnea (adult) (pediatric): Secondary | ICD-10-CM | POA: Diagnosis not present

## 2022-01-15 DIAGNOSIS — I4891 Unspecified atrial fibrillation: Secondary | ICD-10-CM | POA: Insufficient documentation

## 2022-01-15 DIAGNOSIS — I2510 Atherosclerotic heart disease of native coronary artery without angina pectoris: Secondary | ICD-10-CM | POA: Insufficient documentation

## 2022-01-15 DIAGNOSIS — Z7984 Long term (current) use of oral hypoglycemic drugs: Secondary | ICD-10-CM | POA: Insufficient documentation

## 2022-01-15 DIAGNOSIS — I5032 Chronic diastolic (congestive) heart failure: Secondary | ICD-10-CM

## 2022-01-15 DIAGNOSIS — Z87891 Personal history of nicotine dependence: Secondary | ICD-10-CM | POA: Diagnosis not present

## 2022-01-15 DIAGNOSIS — G47419 Narcolepsy without cataplexy: Secondary | ICD-10-CM | POA: Diagnosis not present

## 2022-01-15 DIAGNOSIS — Z7901 Long term (current) use of anticoagulants: Secondary | ICD-10-CM | POA: Diagnosis not present

## 2022-01-15 DIAGNOSIS — I5042 Chronic combined systolic (congestive) and diastolic (congestive) heart failure: Secondary | ICD-10-CM | POA: Diagnosis not present

## 2022-01-15 DIAGNOSIS — I4819 Other persistent atrial fibrillation: Secondary | ICD-10-CM

## 2022-01-15 DIAGNOSIS — E785 Hyperlipidemia, unspecified: Secondary | ICD-10-CM | POA: Diagnosis not present

## 2022-01-15 DIAGNOSIS — J449 Chronic obstructive pulmonary disease, unspecified: Secondary | ICD-10-CM

## 2022-01-15 DIAGNOSIS — E119 Type 2 diabetes mellitus without complications: Secondary | ICD-10-CM | POA: Diagnosis not present

## 2022-01-15 DIAGNOSIS — F419 Anxiety disorder, unspecified: Secondary | ICD-10-CM | POA: Diagnosis not present

## 2022-01-15 MED ORDER — POTASSIUM CHLORIDE CRYS ER 10 MEQ PO TBCR: 10.0000 meq | EXTENDED_RELEASE_TABLET | Freq: Every day | ORAL | 1 refills | Status: DC

## 2022-01-15 NOTE — Addendum Note (Signed)
Addended by: [name] on: 01/15/2022 11:30 AM   Modules accepted: Orders

## 2022-01-15 NOTE — Telephone Encounter (Signed)
Noted  

## 2022-01-15 NOTE — Telephone Encounter (Signed)
Potassium levels were normal (4.4) at latest OV. However as he's on 2 water pills that decrease potassium, reasonable to take 78mq daily - sent to pharmacy.

## 2022-01-15 NOTE — Telephone Encounter (Signed)
Spoke with [name] relaying Dr. Synthia Innocent message.  Verbalizes understanding and will inform pt.    Also, wants to make Dr. Darnell Level aware, pt declined wound care. Did not want her to apply Unna boot.

## 2022-01-15 NOTE — Progress Notes (Signed)
Hold dose today and then continue 1/2 tablet daily except take nothing on Sundays. Recheck in 1 week with home health nurse, [name] [phone]). Contacted pt and advised to hold dose today and then return to current dosing. Recheck in 1 week. Pt verbalized understanding. Contacted [name], [facility] [facility] nurse, she will be going to pt's home once weekly for the next 6-7 weeks. She will check INR weekly when she is there.

## 2022-01-15 NOTE — Patient Instructions (Addendum)
Continue weighing daily and call for an overnight weight gain of 3 pounds or more or a weekly weight gain of more than 5 pounds.   If you have voicemail, please make sure your mailbox is cleaned out so that we may leave a message and please make sure to listen to any voicemails.    Put compression socks on every morning with removal at bedtime   Drink 60 ounces of fluids daily.

## 2022-01-15 NOTE — Patient Instructions (Addendum)
Pre visit review using our clinic review tool, if applicable. No additional management support is needed unless otherwise documented below in the visit note.  Hold dose today and then continue 1/2 tablet daily except take nothing on Sundays. Recheck in 1 week

## 2022-01-18 ENCOUNTER — Inpatient Hospital Stay: Payer: Medicare Other | Attending: Oncology

## 2022-01-18 ENCOUNTER — Encounter: Payer: Self-pay | Admitting: Student in an Organized Health Care Education/Training Program

## 2022-01-18 ENCOUNTER — Ambulatory Visit: Payer: Medicare Other | Admitting: Student in an Organized Health Care Education/Training Program

## 2022-01-18 VITALS — BP 134/80 | HR 74 | Temp 97.9°F | Ht 71.0 in | Wt 265.0 lb

## 2022-01-18 DIAGNOSIS — G4733 Obstructive sleep apnea (adult) (pediatric): Secondary | ICD-10-CM | POA: Diagnosis not present

## 2022-01-18 DIAGNOSIS — Z87891 Personal history of nicotine dependence: Secondary | ICD-10-CM | POA: Diagnosis not present

## 2022-01-18 DIAGNOSIS — J9612 Chronic respiratory failure with hypercapnia: Secondary | ICD-10-CM | POA: Diagnosis not present

## 2022-01-18 DIAGNOSIS — J449 Chronic obstructive pulmonary disease, unspecified: Secondary | ICD-10-CM | POA: Diagnosis not present

## 2022-01-18 NOTE — Addendum Note (Signed)
Addended by: Claudette Head A on: 01/18/2022 02:02 PM   Modules accepted: Orders

## 2022-01-18 NOTE — Progress Notes (Signed)
Synopsis: Referred in for shortness of breath by [name], MD  Assessment & Plan:   #COPD #Chronic Hypoxic and Hypercapnic Respiratory Failure #CHF #OSA, suspected OHS  Patient with a history of HFpEF with diastolic dysfunction and enlarged RV consistent with a diagnosis of heart failure which explains his overall symptoms and presentation. He has lower extremity pitting edema and a TTE consistent with HFpEF. That said, he has a 31 pack year smoking history and has carried a diagnosis of COPD previously. He is currently on Breztri and is compliant with it. He will require PFT's to confirm obstruction and monitor his lung function.  Furthermore, he was discharged on oxygen at home, but does not have it on upon presentation to clinic today. He was saturating at 90% at rest. I did review his previous blood work and it is consistent with chronic hypercapnic respiratory failure with compensated respiratory acidosis as evidenced by his elevated CO2 on ABG as well as on BMP from his most recent admission. I suspect this is multi-factorial and secondary to his COPD, CHF, and OSA (likely undiagnosed OHS). I will order a split night study to confirm the diagnosis and aid in management. Given the severity of his hypercapnia, I will initiate BiPAP to aid in ventilation while awaiting his sleep study.   Today, I also counseled [name] on the importance of avoiding salt intake given his heart failure and abstinence from smoking. He has switched to edibles and will try to abstain from smoking.  - Split night study; Future - Pulmonary Function Test [facility] Only; Future - Ambulatory referral to sleep medicine - Continue Breztri 2 puffs twice daily - Smoking cessation  Return in about 3 months (around 04/19/2022).  I spent 60 minutes caring for this patient today, including preparing to see the patient, obtaining and/or reviewing separately obtained history, performing a medically appropriate  examination and/or evaluation, counseling and educating the patient/family/caregiver, ordering medications, tests, or procedures, documenting clinical information in the electronic health record, and independently interpreting results (not separately reported/billed) and communicating results to the patient/family/caregiver  [name], MD [facility] 01/18/2022 12:29 PM    End of visit medications:  No orders of the defined types were placed in this encounter.    Current Outpatient Medications:    ACCU-CHEK FASTCLIX LANCETS MISC, Check blood sugar once daily and as instructed. Dx 250.00, Disp: 100 each, Rfl: 3   albuterol (VENTOLIN HFA) 108 (90 Base) MCG/ACT inhaler, Inhale 2 puffs into the lungs every 6 (six) hours as needed for wheezing or shortness of breath., Disp: 8.5 each, Rfl: 6   amiodarone (PACERONE) 200 MG tablet, Take 1 tablet (200 mg total) by mouth daily., Disp: 90 tablet, Rfl: 3   Blood Glucose Monitoring Suppl (BLOOD GLUCOSE MONITOR SYSTEM) W/DEVICE KIT, by Does not apply route. Use to check sugar once daily and as needed Dx: E11.9 **ONE TOUCH VERIO**, Disp: , Rfl:    Budeson-Glycopyrrol-Formoterol (BREZTRI AEROSPHERE) 160-9-4.8 MCG/ACT AERO, Inhale 2 puffs into the lungs 2 (two) times daily., Disp: 32.1 g, Rfl: 3   carvedilol (COREG) 3.125 MG tablet, Take 1 tablet (3.125 mg total) by mouth 2 (two) times daily with a meal., Disp: 180 tablet, Rfl: 1   Cholecalciferol (VITAMIN D) 50 MCG (2000 UT) CAPS, Take 1 capsule (2,000 Units total) by mouth daily., Disp: 30 capsule, Rfl:    citalopram (CELEXA) 10 MG tablet, Take 1 tablet (10 mg total) by mouth daily., Disp: 90 tablet, Rfl: 3  dapagliflozin propanediol (FARXIGA) 5 MG TABS tablet, Take 1 tablet (5 mg total) by mouth daily before breakfast., Disp: 30 tablet, Rfl: 6   Docusate Calcium (STOOL SOFTENER PO), Take 1 tablet by mouth daily., Disp: , Rfl:    fluticasone (FLONASE) 50 MCG/ACT nasal spray, Place 2  sprays into both nostrils daily., Disp: 48 mL, Rfl: 3   furosemide (LASIX) 80 MG tablet, Take 80 mg by mouth 2 (two) times daily., Disp: , Rfl:    glimepiride (AMARYL) 4 MG tablet, Take 1 tablet (4 mg total) by mouth daily with breakfast., Disp: 90 tablet, Rfl: 1   glucose blood (ONETOUCH VERIO) test strip, Check blood sugar 3 times a day, Disp: 300 strip, Rfl: 3   hydrOXYzine (ATARAX) 50 MG tablet, TAKE 1 TABLET (50 MG TOTAL) BY MOUTH 2 (TWO) TIMES DAILY AS NEEDED FOR ANXIETY., Disp: 180 tablet, Rfl: 1   ipratropium (ATROVENT HFA) 17 MCG/ACT inhaler, Inhale 2 puffs into the lungs every 6 (six) hours as needed for wheezing., Disp: 1 each, Rfl: 12   ipratropium-albuterol (DUONEB) 0.5-2.5 (3) MG/3ML SOLN, Take 3 mLs by nebulization every 6 (six) hours as needed (severe shortness of breath/wheezing)., Disp: 360 mL, Rfl: 0   metFORMIN (GLUCOPHAGE) 1000 MG tablet, Take 1 tablet (1,000 mg total) by mouth 2 (two) times daily with a meal., Disp: 180 tablet, Rfl: 1   metolazone (ZAROXOLYN) 2.5 MG tablet, Take 1 tablet (2.5 mg total) by mouth 2 (two) times a week. (Patient taking differently: Take 2.5 mg by mouth 2 (two) times a week. Saturday and wednesday), Disp: 24 tablet, Rfl: 1   montelukast (SINGULAIR) 10 MG tablet, TAKE 1 TABLET BY MOUTH EVERYDAY AT BEDTIME, Disp: 90 tablet, Rfl: 3   NON FORMULARY, 1,200 mg daily. SEA MOSS, Disp: , Rfl:    NON FORMULARY, daily. SUPER BEETS, Disp: , Rfl:    NON FORMULARY, 500 mg once. TURMERIC, Disp: , Rfl:    pantoprazole (PROTONIX) 40 MG tablet, TAKE 1 TABLET (40 MG TOTAL) BY MOUTH TWICE A DAY BEFORE MEALS, Disp: 180 tablet, Rfl: 0   potassium chloride SA (KLOR-CON M) 10 MEQ tablet, Take 1 tablet (10 mEq total) by mouth daily., Disp: 90 tablet, Rfl: 1   Probiotic Product (PROBIOTIC DAILY PO), Take 1 tablet by mouth daily., Disp: , Rfl:    simvastatin (ZOCOR) 20 MG tablet, TAKE 1 TABLET BY MOUTH EVERY DAY IN THE EVENING, Disp: 90 tablet, Rfl: 0   vitamin B-12  (CYANOCOBALAMIN) 1000 MCG tablet, Take 1 tablet (1,000 mcg total) by mouth every Monday, Wednesday, and Friday. (Patient taking differently: Take 1,000 mcg by mouth daily.), Disp: , Rfl:    warfarin (COUMADIN) 5 MG tablet, TAKE 1/2 TABLET BY MOUTH DAILY EXCEPT TAKE NOTHING ON WEDNESDAYS OR AS DIRECTED BY ANTICOAGULATION CLINIC, Disp: 60 tablet, Rfl: 1   Subjective:   PATIENT ID: [name] GENDER: male DOB: Feb 24, 1956, MRN: [id number]  Chief Complaint  Patient presents with   pulmonary consult    Hx of COPD. Sob with exertion and dry cough in the morning    HPI  Mr. Pacholski is a pleasant 66 year old male with a history of OSA and COPD (previously followed by Dr. Ashby Dawes, last visit 2020) who presents to clinic for an evaluation.  He was recently admitted to the hospital earlier in September of 2023 for increased shortness of breath, wheezing, and lower extremity edema. Patient tells me that the reason he was brought in was because he fell. He had  been dealing with significant lower extremity edema for a while prior to this presentation. On admission, he was found to be in heart failure and was diuresed. He was also noted to have wheezing on exam and received inhalers as well as steroids. He feels significantly better as compared to when he was in the hospital.  He continues to have exertional dyspnea, but this is improved. He has no wheezing, no chest tightness, and no chest pain. He coughs in the morning but this is not productive. He doesn't smoke cigarettes now but does some one joint of marijuana a day. He is trying to switch to edibles.  Review of his record is notable for an VBG from 12/28/2021 showing a pH of 7.36 and a CO2 of 31. His BMP is notable for elevated bicarb, most recently at 71. CT chest did not show any consolidations on admission to the hospital, while his BNP was elevated to 289.  Ancillary information including prior medications, full  medical/surgical/family/social histories, and PFTs (when available) are listed below and have been reviewed.   Review of Systems  Constitutional:  Negative for chills, fever and weight loss.  Respiratory:  Positive for shortness of breath. Negative for wheezing.   Cardiovascular:  Positive for leg swelling.     Objective:   Vitals:   01/18/22 1051  BP: 134/80  Pulse: 74  Temp: 97.9 F (36.6 C)  TempSrc: Temporal  SpO2: 90%  Weight: 265 lb (120.2 kg)  Height: _0  (1.803 m)   90% on RA BMI Readings from Last 3 Encounters:  01/18/22 36.96 kg/m  01/15/22 38.27 kg/m  01/12/22 39.07 kg/m   Wt Readings from Last 3 Encounters:  01/18/22 265 lb (120.2 kg)  01/15/22 274 lb 6 oz (124.5 kg)  01/12/22 280 lb 2 oz (127.1 kg)    Physical Exam Constitutional:      General: He is not in acute distress.    Appearance: He is obese. He is not ill-appearing.  HENT:     Head: Normocephalic.     Mouth/Throat:     Mouth: Mucous membranes are moist.  Eyes:     Extraocular Movements: Extraocular movements intact.  Cardiovascular:     Rate and Rhythm: Normal rate and regular rhythm.     Heart sounds: Normal heart sounds.  Pulmonary:     Effort: Pulmonary effort is normal.     Breath sounds: Normal breath sounds.  Abdominal:     General: There is distension.     Palpations: Abdomen is soft.  Musculoskeletal:     Cervical back: Normal range of motion and neck supple.     Right lower leg: Edema present.     Left lower leg: Edema present.     Comments: Changes of stasis dermatitis  Neurological:     General: No focal deficit present.     Mental Status: He is alert and oriented to person, place, and time. Mental status is at baseline.       Ancillary Information    Past Medical History:  Diagnosis Date   Abnormal drug screen 2015   MJ positive x3 - one more and we will stop prescribing ativan (08/2013).   Anemia    Angina    Anxiety    Arrhythmia    atrial  fibrillation   Arthritis    CAD (coronary artery disease)    nonobstructive   Cannabis abuse    Chronic systolic CHF (congestive heart failure) ([facility]) 2013   NYHA Class  II/III   COPD (chronic obstructive pulmonary disease) ([facility])    Diabetes type 2, uncontrolled    declines DSME   Dilated cardiomyopathy ([place]) 2013   now improved   Ex-smoker    Frequent headaches    History of atrial fibrillation 2013   chronic, s/p ablation prior on coumadin   Hyperlipidemia    Hypertension    Migraine    Narcolepsy    Nonischemic cardiomyopathy ([place]) 2013   EF 25% per Dr Nehemiah Massed   Obesity    OSA (obstructive sleep apnea)    does not use CPAP - unable to tolerate   Seasonal allergies      Family History  Problem Relation Age of Onset   Heart failure Father 52   Cancer Mother 67       NHL   Hypertension Maternal Grandfather    CAD Maternal Grandfather 70       MI   Diabetes Other        grandparents   Cancer Brother 63       lung (smoker)     Past Surgical History:  Procedure Laterality Date   ATRIAL FLUTTER ABLATION N/A 09/21/2011   Procedure: ATRIAL FLUTTER ABLATION;  Surgeon: Thompson Grayer, MD;  Location: [facility];  Service: Cardiovascular;  Laterality: N/A;   CARDIAC ELECTROPHYSIOLOGY [place] AND ABLATION  2013   for atrial flutter   CARDIOVASCULAR STRESS TEST  03/2012   ETT WNL Nehemiah Massed)   CARDIOVERSION N/A 08/12/2021   Procedure: CARDIOVERSION;  Surgeon: Corey Skains, MD;  Location: [facility]S;  Service: Cardiovascular;  Laterality: N/A;   COLONOSCOPY WITH PROPOFOL N/A 05/22/2020   TA, diverticulosis, descending colon ulcer biopsy WNL, int hem Allen Norris, Darren, MD)   ESOPHAGOGASTRODUODENOSCOPY (EGD) WITH PROPOFOL N/A 05/22/2020   WNL ([place])   US ECHOCARDIOGRAPHY  2014   EF 50%, nl LV fxn, RV nl size/function, mild mitral insuff    Social History   Socioeconomic History   Marital status: Single    Spouse name: Not on file   Number of children: 0   Years of  education: Not on file   Highest education level: Not on file  Occupational History   Occupation: disable   Tobacco Use   Smoking status: Former    Packs/day: 1.00    Years: 31.00    Total pack years: 31.00    Types: Cigarettes    Quit date: 04/26/2018    Years since quitting: 3.7   Smokeless tobacco: Never  Vaping Use   Vaping Use: Never used  Substance and Sexual Activity   Alcohol use: No    Alcohol/week: 0.0 standard drinks of alcohol   Drug use: Yes    Types: Marijuana   Sexual activity: Not Currently  Other Topics Concern   Not on file  Social History Narrative   Pt lives in [place]   Divorced   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   Disability due to neck arthritis, anxiety and sleep apnea   Edu: HS   Activity: no regular exercise, mows lawn (riding and push)   Diet: good water, fruits/vegetables daily   Social Determinants of Health   Financial Resource Strain: Medium Risk (10/23/2021)   Overall Financial Resource Strain (CARDIA)    Difficulty of Paying Living Expenses: Somewhat hard  Food Insecurity: No Food Insecurity (09/24/2021)   Hunger Vital Sign    Worried About Running Out of Food in the Last Year: Never true    Ran Out of Food in the Last  Year: Never true  Transportation Needs: No Transportation Needs (09/24/2021)   PRAPARE - Hydrologist (Medical): No    Lack of Transportation (Non-Medical): No  Physical Activity: Inactive (09/24/2021)   Exercise Vital Sign    Days of Exercise per Week: 0 days    Minutes of Exercise per Session: 0 min  Stress: No Stress Concern Present (09/24/2021)   [place]    Feeling of Stress : Not at all  Social Connections: Not on file  Intimate Partner Violence: Not At Risk (07/09/2020)   Humiliation, Afraid, Rape, and Kick questionnaire    Fear of Current or Ex-Partner: No    Emotionally Abused: No    Physically Abused: No    Sexually  Abused: No     Allergies  Allergen Reactions   Atorvastatin Other (See Comments)    Dizziness   Lisinopril Cough     CBC    Component Value Date/Time   WBC 11.5 (H) 01/12/2022 1027   RBC 4.89 01/12/2022 1027   HGB 10.8 (L) 01/12/2022 1027   HGB 16.4 06/20/2013 1139   HCT 36.2 (L) 01/12/2022 1027   HCT 47.9 06/20/2013 1139   PLT 264.0 01/12/2022 1027   PLT 207 06/20/2013 1139   MCV 74.1 (L) 01/12/2022 1027   MCV 90 06/20/2013 1139   MCH 22.1 (L) 01/05/2022 0534   MCHC 30.0 01/12/2022 1027   RDW 23.9 (H) 01/12/2022 1027   RDW 13.3 06/20/2013 1139   LYMPHSABS 0.8 01/12/2022 1027   MONOABS 0.9 01/12/2022 1027   EOSABS 0.1 01/12/2022 1027   BASOSABS 0.1 01/12/2022 1027    Pulmonary Functions Testing Results:     No data to display          Outpatient Medications Prior to Visit  Medication Sig Dispense Refill   ACCU-CHEK FASTCLIX LANCETS MISC Check blood sugar once daily and as instructed. Dx 250.00 100 each 3   albuterol (VENTOLIN HFA) 108 (90 Base) MCG/ACT inhaler Inhale 2 puffs into the lungs every 6 (six) hours as needed for wheezing or shortness of breath. 8.5 each 6   amiodarone (PACERONE) 200 MG tablet Take 1 tablet (200 mg total) by mouth daily. 90 tablet 3   Blood Glucose Monitoring Suppl (BLOOD GLUCOSE MONITOR SYSTEM) W/DEVICE KIT by Does not apply route. Use to check sugar once daily and as needed Dx: E11.9 **ONE TOUCH VERIO**     Budeson-Glycopyrrol-Formoterol (BREZTRI AEROSPHERE) 160-9-4.8 MCG/ACT AERO Inhale 2 puffs into the lungs 2 (two) times daily. 32.1 g 3   carvedilol (COREG) 3.125 MG tablet Take 1 tablet (3.125 mg total) by mouth 2 (two) times daily with a meal. 180 tablet 1   Cholecalciferol (VITAMIN D) 50 MCG (2000 UT) CAPS Take 1 capsule (2,000 Units total) by mouth daily. 30 capsule    citalopram (CELEXA) 10 MG tablet Take 1 tablet (10 mg total) by mouth daily. 90 tablet 3   dapagliflozin propanediol (FARXIGA) 5 MG TABS tablet Take 1 tablet (5 mg  total) by mouth daily before breakfast. 30 tablet 6   Docusate Calcium (STOOL SOFTENER PO) Take 1 tablet by mouth daily.     fluticasone (FLONASE) 50 MCG/ACT nasal spray Place 2 sprays into both nostrils daily. 48 mL 3   furosemide (LASIX) 80 MG tablet Take 80 mg by mouth 2 (two) times daily.     glimepiride (AMARYL) 4 MG tablet Take 1 tablet (4 mg total) by mouth daily  with breakfast. 90 tablet 1   glucose blood (ONETOUCH VERIO) test strip Check blood sugar 3 times a day 300 strip 3   hydrOXYzine (ATARAX) 50 MG tablet TAKE 1 TABLET (50 MG TOTAL) BY MOUTH 2 (TWO) TIMES DAILY AS NEEDED FOR ANXIETY. 180 tablet 1   ipratropium (ATROVENT HFA) 17 MCG/ACT inhaler Inhale 2 puffs into the lungs every 6 (six) hours as needed for wheezing. 1 each 12   ipratropium-albuterol (DUONEB) 0.5-2.5 (3) MG/3ML SOLN Take 3 mLs by nebulization every 6 (six) hours as needed (severe shortness of breath/wheezing). 360 mL 0   metFORMIN (GLUCOPHAGE) 1000 MG tablet Take 1 tablet (1,000 mg total) by mouth 2 (two) times daily with a meal. 180 tablet 1   metolazone (ZAROXOLYN) 2.5 MG tablet Take 1 tablet (2.5 mg total) by mouth 2 (two) times a week. (Patient taking differently: Take 2.5 mg by mouth 2 (two) times a week. Saturday and wednesday) 24 tablet 1   montelukast (SINGULAIR) 10 MG tablet TAKE 1 TABLET BY MOUTH EVERYDAY AT BEDTIME 90 tablet 3   NON FORMULARY 1,200 mg daily. SEA MOSS     NON FORMULARY daily. SUPER BEETS     NON FORMULARY 500 mg once. TURMERIC     pantoprazole (PROTONIX) 40 MG tablet TAKE 1 TABLET (40 MG TOTAL) BY MOUTH TWICE A DAY BEFORE MEALS 180 tablet 0   potassium chloride SA (KLOR-CON M) 10 MEQ tablet Take 1 tablet (10 mEq total) by mouth daily. 90 tablet 1   Probiotic Product (PROBIOTIC DAILY PO) Take 1 tablet by mouth daily.     simvastatin (ZOCOR) 20 MG tablet TAKE 1 TABLET BY MOUTH EVERY DAY IN THE EVENING 90 tablet 0   vitamin B-12 (CYANOCOBALAMIN) 1000 MCG tablet Take 1 tablet (1,000 mcg total)  by mouth every Monday, Wednesday, and Friday. (Patient taking differently: Take 1,000 mcg by mouth daily.)     warfarin (COUMADIN) 5 MG tablet TAKE 1/2 TABLET BY MOUTH DAILY EXCEPT TAKE NOTHING ON WEDNESDAYS OR AS DIRECTED BY ANTICOAGULATION CLINIC 60 tablet 1   No facility-administered medications prior to visit.

## 2022-01-18 NOTE — Patient Instructions (Signed)
Continue using your Judithann Sauger as you are. Monitor your salt intake and your weight. I have ordered you pulmonary function tests and a sleep study.

## 2022-01-20 DIAGNOSIS — I420 Dilated cardiomyopathy: Secondary | ICD-10-CM | POA: Diagnosis not present

## 2022-01-20 DIAGNOSIS — K219 Gastro-esophageal reflux disease without esophagitis: Secondary | ICD-10-CM | POA: Diagnosis not present

## 2022-01-20 DIAGNOSIS — I25119 Atherosclerotic heart disease of native coronary artery with unspecified angina pectoris: Secondary | ICD-10-CM | POA: Diagnosis not present

## 2022-01-20 DIAGNOSIS — J441 Chronic obstructive pulmonary disease with (acute) exacerbation: Secondary | ICD-10-CM | POA: Diagnosis not present

## 2022-01-20 DIAGNOSIS — L97229 Non-pressure chronic ulcer of left calf with unspecified severity: Secondary | ICD-10-CM | POA: Diagnosis not present

## 2022-01-20 DIAGNOSIS — I110 Hypertensive heart disease with heart failure: Secondary | ICD-10-CM | POA: Diagnosis not present

## 2022-01-20 DIAGNOSIS — Z9181 History of falling: Secondary | ICD-10-CM | POA: Diagnosis not present

## 2022-01-20 DIAGNOSIS — I4891 Unspecified atrial fibrillation: Secondary | ICD-10-CM | POA: Diagnosis not present

## 2022-01-20 DIAGNOSIS — G4733 Obstructive sleep apnea (adult) (pediatric): Secondary | ICD-10-CM | POA: Diagnosis not present

## 2022-01-20 DIAGNOSIS — M1990 Unspecified osteoarthritis, unspecified site: Secondary | ICD-10-CM | POA: Diagnosis not present

## 2022-01-20 DIAGNOSIS — F32A Depression, unspecified: Secondary | ICD-10-CM | POA: Diagnosis not present

## 2022-01-20 DIAGNOSIS — E1165 Type 2 diabetes mellitus with hyperglycemia: Secondary | ICD-10-CM | POA: Diagnosis not present

## 2022-01-20 DIAGNOSIS — G43909 Migraine, unspecified, not intractable, without status migrainosus: Secondary | ICD-10-CM | POA: Diagnosis not present

## 2022-01-20 DIAGNOSIS — I872 Venous insufficiency (chronic) (peripheral): Secondary | ICD-10-CM | POA: Diagnosis not present

## 2022-01-20 DIAGNOSIS — Z5181 Encounter for therapeutic drug level monitoring: Secondary | ICD-10-CM | POA: Diagnosis not present

## 2022-01-20 DIAGNOSIS — E876 Hypokalemia: Secondary | ICD-10-CM | POA: Diagnosis not present

## 2022-01-20 DIAGNOSIS — L03115 Cellulitis of right lower limb: Secondary | ICD-10-CM | POA: Diagnosis not present

## 2022-01-20 DIAGNOSIS — L97829 Non-pressure chronic ulcer of other part of left lower leg with unspecified severity: Secondary | ICD-10-CM | POA: Diagnosis not present

## 2022-01-20 DIAGNOSIS — I5022 Chronic systolic (congestive) heart failure: Secondary | ICD-10-CM | POA: Diagnosis not present

## 2022-01-20 DIAGNOSIS — L03116 Cellulitis of left lower limb: Secondary | ICD-10-CM | POA: Diagnosis not present

## 2022-01-20 DIAGNOSIS — E785 Hyperlipidemia, unspecified: Secondary | ICD-10-CM | POA: Diagnosis not present

## 2022-01-20 DIAGNOSIS — I252 Old myocardial infarction: Secondary | ICD-10-CM | POA: Diagnosis not present

## 2022-01-20 DIAGNOSIS — I5033 Acute on chronic diastolic (congestive) heart failure: Secondary | ICD-10-CM | POA: Diagnosis not present

## 2022-01-21 ENCOUNTER — Ambulatory Visit (INDEPENDENT_AMBULATORY_CARE_PROVIDER_SITE_OTHER): Payer: Medicare Other

## 2022-01-21 DIAGNOSIS — J441 Chronic obstructive pulmonary disease with (acute) exacerbation: Secondary | ICD-10-CM | POA: Diagnosis not present

## 2022-01-21 DIAGNOSIS — I5022 Chronic systolic (congestive) heart failure: Secondary | ICD-10-CM | POA: Diagnosis not present

## 2022-01-21 DIAGNOSIS — I420 Dilated cardiomyopathy: Secondary | ICD-10-CM | POA: Diagnosis not present

## 2022-01-21 DIAGNOSIS — L03115 Cellulitis of right lower limb: Secondary | ICD-10-CM | POA: Diagnosis not present

## 2022-01-21 DIAGNOSIS — Z5181 Encounter for therapeutic drug level monitoring: Secondary | ICD-10-CM | POA: Diagnosis not present

## 2022-01-21 DIAGNOSIS — I25119 Atherosclerotic heart disease of native coronary artery with unspecified angina pectoris: Secondary | ICD-10-CM | POA: Diagnosis not present

## 2022-01-21 DIAGNOSIS — M1990 Unspecified osteoarthritis, unspecified site: Secondary | ICD-10-CM | POA: Diagnosis not present

## 2022-01-21 DIAGNOSIS — I872 Venous insufficiency (chronic) (peripheral): Secondary | ICD-10-CM | POA: Diagnosis not present

## 2022-01-21 DIAGNOSIS — E1165 Type 2 diabetes mellitus with hyperglycemia: Secondary | ICD-10-CM | POA: Diagnosis not present

## 2022-01-21 DIAGNOSIS — E876 Hypokalemia: Secondary | ICD-10-CM | POA: Diagnosis not present

## 2022-01-21 DIAGNOSIS — K219 Gastro-esophageal reflux disease without esophagitis: Secondary | ICD-10-CM | POA: Diagnosis not present

## 2022-01-21 DIAGNOSIS — Z7901 Long term (current) use of anticoagulants: Secondary | ICD-10-CM

## 2022-01-21 DIAGNOSIS — G4733 Obstructive sleep apnea (adult) (pediatric): Secondary | ICD-10-CM | POA: Diagnosis not present

## 2022-01-21 DIAGNOSIS — L03116 Cellulitis of left lower limb: Secondary | ICD-10-CM | POA: Diagnosis not present

## 2022-01-21 DIAGNOSIS — E785 Hyperlipidemia, unspecified: Secondary | ICD-10-CM | POA: Diagnosis not present

## 2022-01-21 DIAGNOSIS — I5033 Acute on chronic diastolic (congestive) heart failure: Secondary | ICD-10-CM | POA: Diagnosis not present

## 2022-01-21 DIAGNOSIS — Z9181 History of falling: Secondary | ICD-10-CM | POA: Diagnosis not present

## 2022-01-21 DIAGNOSIS — F32A Depression, unspecified: Secondary | ICD-10-CM | POA: Diagnosis not present

## 2022-01-21 DIAGNOSIS — L97229 Non-pressure chronic ulcer of left calf with unspecified severity: Secondary | ICD-10-CM | POA: Diagnosis not present

## 2022-01-21 DIAGNOSIS — L97829 Non-pressure chronic ulcer of other part of left lower leg with unspecified severity: Secondary | ICD-10-CM | POA: Diagnosis not present

## 2022-01-21 DIAGNOSIS — I110 Hypertensive heart disease with heart failure: Secondary | ICD-10-CM | POA: Diagnosis not present

## 2022-01-21 DIAGNOSIS — I252 Old myocardial infarction: Secondary | ICD-10-CM | POA: Diagnosis not present

## 2022-01-21 DIAGNOSIS — I4891 Unspecified atrial fibrillation: Secondary | ICD-10-CM | POA: Diagnosis not present

## 2022-01-21 DIAGNOSIS — G43909 Migraine, unspecified, not intractable, without status migrainosus: Secondary | ICD-10-CM | POA: Diagnosis not present

## 2022-01-21 LAB — POCT INR: INR: 2.2 (ref 2.0–3.0)

## 2022-01-21 NOTE — Progress Notes (Signed)
Continue 1/2 tablet daily except take nothing on Sundays. Recheck in 1 week with home health nurse, [place] from [facility] [phone]).    Amiee called with results and dosing instructions given to her and pt at that time. She will check INR next week when she is there.

## 2022-01-21 NOTE — Patient Instructions (Signed)
Continue 1/2 tablet daily except take nothing on Sundays. Recheck in 1 week with home health nurse.

## 2022-01-22 ENCOUNTER — Ambulatory Visit: Payer: Self-pay

## 2022-01-22 DIAGNOSIS — E876 Hypokalemia: Secondary | ICD-10-CM | POA: Diagnosis not present

## 2022-01-22 DIAGNOSIS — I420 Dilated cardiomyopathy: Secondary | ICD-10-CM | POA: Diagnosis not present

## 2022-01-22 DIAGNOSIS — I5022 Chronic systolic (congestive) heart failure: Secondary | ICD-10-CM | POA: Diagnosis not present

## 2022-01-22 DIAGNOSIS — L03115 Cellulitis of right lower limb: Secondary | ICD-10-CM | POA: Diagnosis not present

## 2022-01-22 DIAGNOSIS — G43909 Migraine, unspecified, not intractable, without status migrainosus: Secondary | ICD-10-CM | POA: Diagnosis not present

## 2022-01-22 DIAGNOSIS — L97829 Non-pressure chronic ulcer of other part of left lower leg with unspecified severity: Secondary | ICD-10-CM | POA: Diagnosis not present

## 2022-01-22 DIAGNOSIS — Z9181 History of falling: Secondary | ICD-10-CM | POA: Diagnosis not present

## 2022-01-22 DIAGNOSIS — M1990 Unspecified osteoarthritis, unspecified site: Secondary | ICD-10-CM | POA: Diagnosis not present

## 2022-01-22 DIAGNOSIS — I872 Venous insufficiency (chronic) (peripheral): Secondary | ICD-10-CM | POA: Diagnosis not present

## 2022-01-22 DIAGNOSIS — I252 Old myocardial infarction: Secondary | ICD-10-CM | POA: Diagnosis not present

## 2022-01-22 DIAGNOSIS — E1165 Type 2 diabetes mellitus with hyperglycemia: Secondary | ICD-10-CM | POA: Diagnosis not present

## 2022-01-22 DIAGNOSIS — L97229 Non-pressure chronic ulcer of left calf with unspecified severity: Secondary | ICD-10-CM | POA: Diagnosis not present

## 2022-01-22 DIAGNOSIS — J441 Chronic obstructive pulmonary disease with (acute) exacerbation: Secondary | ICD-10-CM | POA: Diagnosis not present

## 2022-01-22 DIAGNOSIS — Z5181 Encounter for therapeutic drug level monitoring: Secondary | ICD-10-CM | POA: Diagnosis not present

## 2022-01-22 DIAGNOSIS — I4891 Unspecified atrial fibrillation: Secondary | ICD-10-CM | POA: Diagnosis not present

## 2022-01-22 DIAGNOSIS — G4733 Obstructive sleep apnea (adult) (pediatric): Secondary | ICD-10-CM | POA: Diagnosis not present

## 2022-01-22 DIAGNOSIS — L03116 Cellulitis of left lower limb: Secondary | ICD-10-CM | POA: Diagnosis not present

## 2022-01-22 DIAGNOSIS — I110 Hypertensive heart disease with heart failure: Secondary | ICD-10-CM | POA: Diagnosis not present

## 2022-01-22 DIAGNOSIS — I5033 Acute on chronic diastolic (congestive) heart failure: Secondary | ICD-10-CM | POA: Diagnosis not present

## 2022-01-22 DIAGNOSIS — I25119 Atherosclerotic heart disease of native coronary artery with unspecified angina pectoris: Secondary | ICD-10-CM | POA: Diagnosis not present

## 2022-01-22 DIAGNOSIS — E785 Hyperlipidemia, unspecified: Secondary | ICD-10-CM | POA: Diagnosis not present

## 2022-01-22 DIAGNOSIS — K219 Gastro-esophageal reflux disease without esophagitis: Secondary | ICD-10-CM | POA: Diagnosis not present

## 2022-01-22 DIAGNOSIS — F32A Depression, unspecified: Secondary | ICD-10-CM | POA: Diagnosis not present

## 2022-01-22 NOTE — Patient Outreach (Signed)
  Care Coordination   01/22/2022 Name: [name] MRN: [id number] DOB: 05-31-1955   Care Coordination Outreach Attempts:  An unsuccessful telephone outreach was attempted for a scheduled appointment today.  Follow Up Plan:  Additional outreach attempts will be made to offer the patient care coordination information and services.   Encounter Outcome:  No Answer  Care Coordination Interventions Activated:  No   Care Coordination Interventions:  No, not indicated    Quinn Plowman RN,BSN,CCM [place] [phone] direct line

## 2022-01-23 DIAGNOSIS — E1159 Type 2 diabetes mellitus with other circulatory complications: Secondary | ICD-10-CM

## 2022-01-23 DIAGNOSIS — I5030 Unspecified diastolic (congestive) heart failure: Secondary | ICD-10-CM | POA: Diagnosis not present

## 2022-01-23 DIAGNOSIS — J449 Chronic obstructive pulmonary disease, unspecified: Secondary | ICD-10-CM | POA: Diagnosis not present

## 2022-01-23 DIAGNOSIS — I2510 Atherosclerotic heart disease of native coronary artery without angina pectoris: Secondary | ICD-10-CM

## 2022-01-23 DIAGNOSIS — I119 Hypertensive heart disease without heart failure: Secondary | ICD-10-CM | POA: Diagnosis not present

## 2022-01-23 DIAGNOSIS — E785 Hyperlipidemia, unspecified: Secondary | ICD-10-CM

## 2022-01-23 DIAGNOSIS — I4891 Unspecified atrial fibrillation: Secondary | ICD-10-CM | POA: Diagnosis not present

## 2022-01-25 ENCOUNTER — Other Ambulatory Visit: Payer: Self-pay | Admitting: Family Medicine

## 2022-01-26 ENCOUNTER — Encounter: Payer: Medicare Other | Admitting: Family Medicine

## 2022-01-26 NOTE — Telephone Encounter (Signed)
E-scribed refill.  Plz r/s CPE and lab visits.

## 2022-01-27 ENCOUNTER — Telehealth: Payer: Self-pay

## 2022-01-27 NOTE — Chronic Care Management (AMB) (Signed)
Chronic Care Management Pharmacy Assistant   Name: TAMAJ JURGENS  MRN: [id number] DOB: 12-05-1955  Reason for Encounter: Reminder Call     Medications: Outpatient Encounter Medications as of 01/27/2022  Medication Sig   ACCU-CHEK FASTCLIX LANCETS MISC Check blood sugar once daily and as instructed. Dx 250.00   albuterol (VENTOLIN HFA) 108 (90 Base) MCG/ACT inhaler Inhale 2 puffs into the lungs every 6 (six) hours as needed for wheezing or shortness of breath.   amiodarone (PACERONE) 200 MG tablet Take 1 tablet (200 mg total) by mouth daily.   Blood Glucose Monitoring Suppl (BLOOD GLUCOSE MONITOR SYSTEM) W/DEVICE KIT by Does not apply route. Use to check sugar once daily and as needed Dx: E11.9 **ONE TOUCH VERIO**   Budeson-Glycopyrrol-Formoterol (BREZTRI AEROSPHERE) 160-9-4.8 MCG/ACT AERO Inhale 2 puffs into the lungs 2 (two) times daily.   carvedilol (COREG) 3.125 MG tablet Take 1 tablet (3.125 mg total) by mouth 2 (two) times daily with a meal.   carvedilol (COREG) 6.25 MG tablet TAKE 1 TABLET BY MOUTH TWICE A DAY WITH FOOD   Cholecalciferol (VITAMIN D) 50 MCG (2000 UT) CAPS Take 1 capsule (2,000 Units total) by mouth daily.   citalopram (CELEXA) 10 MG tablet Take 1 tablet (10 mg total) by mouth daily.   dapagliflozin propanediol (FARXIGA) 5 MG TABS tablet Take 1 tablet (5 mg total) by mouth daily before breakfast.   Docusate Calcium (STOOL SOFTENER PO) Take 1 tablet by mouth daily.   fluticasone (FLONASE) 50 MCG/ACT nasal spray Place 2 sprays into both nostrils daily.   furosemide (LASIX) 80 MG tablet Take 80 mg by mouth 2 (two) times daily.   glimepiride (AMARYL) 4 MG tablet Take 1 tablet (4 mg total) by mouth daily with breakfast.   glucose blood (ONETOUCH VERIO) test strip Check blood sugar 3 times a day   hydrOXYzine (ATARAX) 50 MG tablet TAKE 1 TABLET (50 MG TOTAL) BY MOUTH 2 (TWO) TIMES DAILY AS NEEDED FOR ANXIETY.   ipratropium (ATROVENT HFA) 17 MCG/ACT inhaler Inhale 2  puffs into the lungs every 6 (six) hours as needed for wheezing.   ipratropium-albuterol (DUONEB) 0.5-2.5 (3) MG/3ML SOLN Take 3 mLs by nebulization every 6 (six) hours as needed (severe shortness of breath/wheezing).   metFORMIN (GLUCOPHAGE) 1000 MG tablet Take 1 tablet (1,000 mg total) by mouth 2 (two) times daily with a meal.   metolazone (ZAROXOLYN) 2.5 MG tablet Take 1 tablet (2.5 mg total) by mouth 2 (two) times a week. (Patient taking differently: Take 2.5 mg by mouth 2 (two) times a week. Saturday and wednesday)   montelukast (SINGULAIR) 10 MG tablet TAKE 1 TABLET BY MOUTH EVERYDAY AT BEDTIME   NON FORMULARY 1,200 mg daily. SEA MOSS   NON FORMULARY daily. SUPER BEETS   NON FORMULARY 500 mg once. TURMERIC   pantoprazole (PROTONIX) 40 MG tablet TAKE 1 TABLET (40 MG TOTAL) BY MOUTH TWICE A DAY BEFORE MEALS   potassium chloride SA (KLOR-CON M) 10 MEQ tablet Take 1 tablet (10 mEq total) by mouth daily.   Probiotic Product (PROBIOTIC DAILY PO) Take 1 tablet by mouth daily.   simvastatin (ZOCOR) 20 MG tablet TAKE 1 TABLET BY MOUTH EVERY DAY IN THE EVENING   vitamin B-12 (CYANOCOBALAMIN) 1000 MCG tablet Take 1 tablet (1,000 mcg total) by mouth every Monday, Wednesday, and Friday. (Patient taking differently: Take 1,000 mcg by mouth daily.)   warfarin (COUMADIN) 5 MG tablet TAKE 1/2 TABLET BY MOUTH DAILY EXCEPT TAKE NOTHING ON  WEDNESDAYS OR AS DIRECTED BY ANTICOAGULATION CLINIC   No facility-administered encounter medications on file as of 01/27/2022.  [name] Flanigan was contacted to remind of upcoming telephone visit with [name] on 02/01/22 at 3:45. Patient was reminded to have any blood glucose and blood pressure readings available for review at appointment.   Patient confirmed appointment.  Are you having any problems with your medications? No   Do you have any concerns you like to discuss with the pharmacist? No  CCM referral has been placed prior to visit?  Yes   Star Rating  Drugs: Medication:  Last Fill: Day Supply Farxiga 34m   manufacturer  Glimepiride 447m7/28/23 90 Metformin 100067m/30/23 90 Simvastatin 52m89m5/23  90  [place] notified  [phone]

## 2022-01-28 ENCOUNTER — Ambulatory Visit (INDEPENDENT_AMBULATORY_CARE_PROVIDER_SITE_OTHER): Payer: Medicare Other

## 2022-01-28 DIAGNOSIS — Z7901 Long term (current) use of anticoagulants: Secondary | ICD-10-CM | POA: Diagnosis not present

## 2022-01-28 DIAGNOSIS — I4891 Unspecified atrial fibrillation: Secondary | ICD-10-CM | POA: Diagnosis not present

## 2022-01-28 DIAGNOSIS — I25119 Atherosclerotic heart disease of native coronary artery with unspecified angina pectoris: Secondary | ICD-10-CM | POA: Diagnosis not present

## 2022-01-28 DIAGNOSIS — M1990 Unspecified osteoarthritis, unspecified site: Secondary | ICD-10-CM | POA: Diagnosis not present

## 2022-01-28 DIAGNOSIS — L97229 Non-pressure chronic ulcer of left calf with unspecified severity: Secondary | ICD-10-CM | POA: Diagnosis not present

## 2022-01-28 DIAGNOSIS — L03115 Cellulitis of right lower limb: Secondary | ICD-10-CM | POA: Diagnosis not present

## 2022-01-28 DIAGNOSIS — Z9181 History of falling: Secondary | ICD-10-CM | POA: Diagnosis not present

## 2022-01-28 DIAGNOSIS — G4733 Obstructive sleep apnea (adult) (pediatric): Secondary | ICD-10-CM | POA: Diagnosis not present

## 2022-01-28 DIAGNOSIS — F32A Depression, unspecified: Secondary | ICD-10-CM | POA: Diagnosis not present

## 2022-01-28 DIAGNOSIS — I5022 Chronic systolic (congestive) heart failure: Secondary | ICD-10-CM | POA: Diagnosis not present

## 2022-01-28 DIAGNOSIS — E785 Hyperlipidemia, unspecified: Secondary | ICD-10-CM | POA: Diagnosis not present

## 2022-01-28 DIAGNOSIS — E1165 Type 2 diabetes mellitus with hyperglycemia: Secondary | ICD-10-CM | POA: Diagnosis not present

## 2022-01-28 DIAGNOSIS — G43909 Migraine, unspecified, not intractable, without status migrainosus: Secondary | ICD-10-CM | POA: Diagnosis not present

## 2022-01-28 DIAGNOSIS — E876 Hypokalemia: Secondary | ICD-10-CM | POA: Diagnosis not present

## 2022-01-28 DIAGNOSIS — I872 Venous insufficiency (chronic) (peripheral): Secondary | ICD-10-CM | POA: Diagnosis not present

## 2022-01-28 DIAGNOSIS — J441 Chronic obstructive pulmonary disease with (acute) exacerbation: Secondary | ICD-10-CM | POA: Diagnosis not present

## 2022-01-28 DIAGNOSIS — I420 Dilated cardiomyopathy: Secondary | ICD-10-CM | POA: Diagnosis not present

## 2022-01-28 DIAGNOSIS — L97829 Non-pressure chronic ulcer of other part of left lower leg with unspecified severity: Secondary | ICD-10-CM | POA: Diagnosis not present

## 2022-01-28 DIAGNOSIS — Z5181 Encounter for therapeutic drug level monitoring: Secondary | ICD-10-CM | POA: Diagnosis not present

## 2022-01-28 DIAGNOSIS — I110 Hypertensive heart disease with heart failure: Secondary | ICD-10-CM | POA: Diagnosis not present

## 2022-01-28 DIAGNOSIS — I252 Old myocardial infarction: Secondary | ICD-10-CM | POA: Diagnosis not present

## 2022-01-28 DIAGNOSIS — K219 Gastro-esophageal reflux disease without esophagitis: Secondary | ICD-10-CM | POA: Diagnosis not present

## 2022-01-28 DIAGNOSIS — L03116 Cellulitis of left lower limb: Secondary | ICD-10-CM | POA: Diagnosis not present

## 2022-01-28 DIAGNOSIS — I5033 Acute on chronic diastolic (congestive) heart failure: Secondary | ICD-10-CM | POA: Diagnosis not present

## 2022-01-28 LAB — POCT INR: INR: 3.1 — AB (ref 2.0–3.0)

## 2022-01-28 NOTE — Progress Notes (Signed)
Continue 1/2 tablet daily except take nothing on Sundays. Recheck in 1 week with home health nurse, [name] from [facility] [phone]).    [name] and pt called with dosing instructions and advised that if patient wants to eat a small serving of green leafy vegetables today that would be acceptable, both verbalize understanding.

## 2022-01-28 NOTE — Patient Instructions (Signed)
Continue 1/2 tablet daily except take nothing on Sundays. Recheck in 1 week with home health nurse, [name] from [facility] [phone]). If you would like to eat a small serving of green leafy vegetables today that would be acceptable.

## 2022-01-30 DIAGNOSIS — M1990 Unspecified osteoarthritis, unspecified site: Secondary | ICD-10-CM | POA: Diagnosis not present

## 2022-01-30 DIAGNOSIS — G43909 Migraine, unspecified, not intractable, without status migrainosus: Secondary | ICD-10-CM | POA: Diagnosis not present

## 2022-01-30 DIAGNOSIS — I25119 Atherosclerotic heart disease of native coronary artery with unspecified angina pectoris: Secondary | ICD-10-CM | POA: Diagnosis not present

## 2022-01-30 DIAGNOSIS — Z5181 Encounter for therapeutic drug level monitoring: Secondary | ICD-10-CM | POA: Diagnosis not present

## 2022-01-30 DIAGNOSIS — G4733 Obstructive sleep apnea (adult) (pediatric): Secondary | ICD-10-CM | POA: Diagnosis not present

## 2022-01-30 DIAGNOSIS — L03115 Cellulitis of right lower limb: Secondary | ICD-10-CM | POA: Diagnosis not present

## 2022-01-30 DIAGNOSIS — I110 Hypertensive heart disease with heart failure: Secondary | ICD-10-CM | POA: Diagnosis not present

## 2022-01-30 DIAGNOSIS — E1165 Type 2 diabetes mellitus with hyperglycemia: Secondary | ICD-10-CM | POA: Diagnosis not present

## 2022-01-30 DIAGNOSIS — I420 Dilated cardiomyopathy: Secondary | ICD-10-CM | POA: Diagnosis not present

## 2022-01-30 DIAGNOSIS — I872 Venous insufficiency (chronic) (peripheral): Secondary | ICD-10-CM | POA: Diagnosis not present

## 2022-01-30 DIAGNOSIS — E876 Hypokalemia: Secondary | ICD-10-CM | POA: Diagnosis not present

## 2022-01-30 DIAGNOSIS — L97229 Non-pressure chronic ulcer of left calf with unspecified severity: Secondary | ICD-10-CM | POA: Diagnosis not present

## 2022-01-30 DIAGNOSIS — L03116 Cellulitis of left lower limb: Secondary | ICD-10-CM | POA: Diagnosis not present

## 2022-01-30 DIAGNOSIS — I5022 Chronic systolic (congestive) heart failure: Secondary | ICD-10-CM | POA: Diagnosis not present

## 2022-01-30 DIAGNOSIS — F32A Depression, unspecified: Secondary | ICD-10-CM | POA: Diagnosis not present

## 2022-01-30 DIAGNOSIS — I252 Old myocardial infarction: Secondary | ICD-10-CM | POA: Diagnosis not present

## 2022-01-30 DIAGNOSIS — I4891 Unspecified atrial fibrillation: Secondary | ICD-10-CM | POA: Diagnosis not present

## 2022-01-30 DIAGNOSIS — K219 Gastro-esophageal reflux disease without esophagitis: Secondary | ICD-10-CM | POA: Diagnosis not present

## 2022-01-30 DIAGNOSIS — L97829 Non-pressure chronic ulcer of other part of left lower leg with unspecified severity: Secondary | ICD-10-CM | POA: Diagnosis not present

## 2022-01-30 DIAGNOSIS — E785 Hyperlipidemia, unspecified: Secondary | ICD-10-CM | POA: Diagnosis not present

## 2022-01-30 DIAGNOSIS — J441 Chronic obstructive pulmonary disease with (acute) exacerbation: Secondary | ICD-10-CM | POA: Diagnosis not present

## 2022-01-30 DIAGNOSIS — Z9181 History of falling: Secondary | ICD-10-CM | POA: Diagnosis not present

## 2022-01-30 DIAGNOSIS — I5033 Acute on chronic diastolic (congestive) heart failure: Secondary | ICD-10-CM | POA: Diagnosis not present

## 2022-02-01 ENCOUNTER — Telehealth: Payer: Medicare Other

## 2022-02-01 ENCOUNTER — Telehealth: Payer: Self-pay | Admitting: Pharmacist

## 2022-02-01 DIAGNOSIS — E785 Hyperlipidemia, unspecified: Secondary | ICD-10-CM | POA: Diagnosis not present

## 2022-02-01 DIAGNOSIS — G4733 Obstructive sleep apnea (adult) (pediatric): Secondary | ICD-10-CM | POA: Diagnosis not present

## 2022-02-01 DIAGNOSIS — K219 Gastro-esophageal reflux disease without esophagitis: Secondary | ICD-10-CM | POA: Diagnosis not present

## 2022-02-01 DIAGNOSIS — L03115 Cellulitis of right lower limb: Secondary | ICD-10-CM | POA: Diagnosis not present

## 2022-02-01 DIAGNOSIS — L03116 Cellulitis of left lower limb: Secondary | ICD-10-CM | POA: Diagnosis not present

## 2022-02-01 DIAGNOSIS — I4891 Unspecified atrial fibrillation: Secondary | ICD-10-CM | POA: Diagnosis not present

## 2022-02-01 DIAGNOSIS — F32A Depression, unspecified: Secondary | ICD-10-CM | POA: Diagnosis not present

## 2022-02-01 DIAGNOSIS — L97229 Non-pressure chronic ulcer of left calf with unspecified severity: Secondary | ICD-10-CM | POA: Diagnosis not present

## 2022-02-01 DIAGNOSIS — I5033 Acute on chronic diastolic (congestive) heart failure: Secondary | ICD-10-CM | POA: Diagnosis not present

## 2022-02-01 DIAGNOSIS — I252 Old myocardial infarction: Secondary | ICD-10-CM | POA: Diagnosis not present

## 2022-02-01 DIAGNOSIS — Z5181 Encounter for therapeutic drug level monitoring: Secondary | ICD-10-CM | POA: Diagnosis not present

## 2022-02-01 DIAGNOSIS — L97829 Non-pressure chronic ulcer of other part of left lower leg with unspecified severity: Secondary | ICD-10-CM | POA: Diagnosis not present

## 2022-02-01 DIAGNOSIS — I420 Dilated cardiomyopathy: Secondary | ICD-10-CM | POA: Diagnosis not present

## 2022-02-01 DIAGNOSIS — I25119 Atherosclerotic heart disease of native coronary artery with unspecified angina pectoris: Secondary | ICD-10-CM | POA: Diagnosis not present

## 2022-02-01 DIAGNOSIS — E876 Hypokalemia: Secondary | ICD-10-CM | POA: Diagnosis not present

## 2022-02-01 DIAGNOSIS — G43909 Migraine, unspecified, not intractable, without status migrainosus: Secondary | ICD-10-CM | POA: Diagnosis not present

## 2022-02-01 DIAGNOSIS — I110 Hypertensive heart disease with heart failure: Secondary | ICD-10-CM | POA: Diagnosis not present

## 2022-02-01 DIAGNOSIS — J441 Chronic obstructive pulmonary disease with (acute) exacerbation: Secondary | ICD-10-CM | POA: Diagnosis not present

## 2022-02-01 DIAGNOSIS — E1165 Type 2 diabetes mellitus with hyperglycemia: Secondary | ICD-10-CM | POA: Diagnosis not present

## 2022-02-01 DIAGNOSIS — I5022 Chronic systolic (congestive) heart failure: Secondary | ICD-10-CM | POA: Diagnosis not present

## 2022-02-01 DIAGNOSIS — I872 Venous insufficiency (chronic) (peripheral): Secondary | ICD-10-CM | POA: Diagnosis not present

## 2022-02-01 DIAGNOSIS — M1990 Unspecified osteoarthritis, unspecified site: Secondary | ICD-10-CM | POA: Diagnosis not present

## 2022-02-01 DIAGNOSIS — Z9181 History of falling: Secondary | ICD-10-CM | POA: Diagnosis not present

## 2022-02-01 NOTE — Telephone Encounter (Signed)
  Chronic Care Management   Outreach Note  02/01/2022 Name: [name] MRN: [id number] DOB: 1955/09/22  Referred by: [name], MD  Patient had a phone appointment scheduled with clinical pharmacist today.  An unsuccessful telephone outreach was attempted today. The patient was referred to the pharmacist for assistance with medications, care management and care coordination.   Patient will NOT be penalized in any way for missing a CCM appointment. The no-show fee does not apply.  If possible, a message was left to return call to: [phone] or to [facility].  [name], PharmD, BCACP Clinical Pharmacist [facility] [phone]

## 2022-02-01 NOTE — Progress Notes (Deleted)
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 prevent exacerbations) -Controlled - 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; he has 2 inhalers remaining from [employer] -Gold Grade: Gold 3 (FEV1 30-49%) -Current COPD Classification:  D (high sx, >/=2 exacerbations/yr) -Pulmonary function testing: Spirometry 05/2018: pre: FVC 54%, FEV1 51%, ratio 0.71 post: FVC 54%, FEV1 49%, ratio 0.69 -Exacerbations  requiring treatment in last 6 months: 0 (last 07/2020) -Current treatment  Breztri 160-9-4.8 mcg/act 2 puff BID (PAP)- Appropriate, Effective, Safe, Accessible Atrovent (ipratropium) 2 puffs PRN - Appropriate, Effective, Safe, Accessible Albuterol HFA -Appropriate, Effective, Safe, Accessible Albuterol neb -Appropriate, Effective, Safe, Accessible Montelukast 10 mg daily HS -Appropriate, Effective, Safe, Accessible Flonase nasal spray -Appropriate, Effective, Safe, Accessible -Medications previously tried: Trelegy, Advair, Spiriva -Patient reports consistent use of maintenance inhaler -Counseled on Proper inhaler technique; Benefits of consistent maintenance inhaler use -Recommend to continue current medication  Insomnia (Goal: manage symptoms) -Uncontrolled  -Hx OSA on CPAP; he is noncompliant with CPAP due to uncomfortable mask, he has not tried new mask in a few years; he has been referred to pulmonology but has not made appt yet; pt provided with phone number for pulm office and advised to call for appt  Patient Goals/Self-Care Activities Patient will:  - take medications as prescribed as evidenced by patient report and record review focus on medication adherence by routine check glucose daily, document, and provide at future appointments check blood pressure daily, document, and provide at future appointments

## 2022-02-02 NOTE — Telephone Encounter (Signed)
Contacted the patient. He had car trouble and phone trouble lately. The patient did receive the Wilder Glade and is taking 1 tablet a day without any side effects at this time. The patient states he is only taking furosemide with increase dose of '80mg'$ . And is not taking torsemide. Rescheduled appointment for follow up with the pharmacist .  [name], CPP notified  Avel Sensor, [place]  [phone]

## 2022-02-04 ENCOUNTER — Other Ambulatory Visit (HOSPITAL_COMMUNITY): Payer: Self-pay

## 2022-02-04 ENCOUNTER — Ambulatory Visit (INDEPENDENT_AMBULATORY_CARE_PROVIDER_SITE_OTHER): Payer: Medicare Other

## 2022-02-04 ENCOUNTER — Ambulatory Visit: Payer: Self-pay

## 2022-02-04 ENCOUNTER — Encounter: Payer: Self-pay | Admitting: Oncology

## 2022-02-04 DIAGNOSIS — Z9181 History of falling: Secondary | ICD-10-CM | POA: Diagnosis not present

## 2022-02-04 DIAGNOSIS — G43909 Migraine, unspecified, not intractable, without status migrainosus: Secondary | ICD-10-CM | POA: Diagnosis not present

## 2022-02-04 DIAGNOSIS — I420 Dilated cardiomyopathy: Secondary | ICD-10-CM | POA: Diagnosis not present

## 2022-02-04 DIAGNOSIS — L97229 Non-pressure chronic ulcer of left calf with unspecified severity: Secondary | ICD-10-CM | POA: Diagnosis not present

## 2022-02-04 DIAGNOSIS — L03115 Cellulitis of right lower limb: Secondary | ICD-10-CM | POA: Diagnosis not present

## 2022-02-04 DIAGNOSIS — G4733 Obstructive sleep apnea (adult) (pediatric): Secondary | ICD-10-CM | POA: Diagnosis not present

## 2022-02-04 DIAGNOSIS — Z7901 Long term (current) use of anticoagulants: Secondary | ICD-10-CM | POA: Diagnosis not present

## 2022-02-04 DIAGNOSIS — I252 Old myocardial infarction: Secondary | ICD-10-CM | POA: Diagnosis not present

## 2022-02-04 DIAGNOSIS — I5033 Acute on chronic diastolic (congestive) heart failure: Secondary | ICD-10-CM | POA: Diagnosis not present

## 2022-02-04 DIAGNOSIS — E876 Hypokalemia: Secondary | ICD-10-CM | POA: Diagnosis not present

## 2022-02-04 DIAGNOSIS — E785 Hyperlipidemia, unspecified: Secondary | ICD-10-CM | POA: Diagnosis not present

## 2022-02-04 DIAGNOSIS — Z5181 Encounter for therapeutic drug level monitoring: Secondary | ICD-10-CM | POA: Diagnosis not present

## 2022-02-04 DIAGNOSIS — I4891 Unspecified atrial fibrillation: Secondary | ICD-10-CM | POA: Diagnosis not present

## 2022-02-04 DIAGNOSIS — I110 Hypertensive heart disease with heart failure: Secondary | ICD-10-CM | POA: Diagnosis not present

## 2022-02-04 DIAGNOSIS — K625 Hemorrhage of anus and rectum: Secondary | ICD-10-CM | POA: Diagnosis not present

## 2022-02-04 DIAGNOSIS — K219 Gastro-esophageal reflux disease without esophagitis: Secondary | ICD-10-CM | POA: Diagnosis not present

## 2022-02-04 DIAGNOSIS — R195 Other fecal abnormalities: Secondary | ICD-10-CM | POA: Diagnosis not present

## 2022-02-04 DIAGNOSIS — E1165 Type 2 diabetes mellitus with hyperglycemia: Secondary | ICD-10-CM | POA: Diagnosis not present

## 2022-02-04 DIAGNOSIS — J441 Chronic obstructive pulmonary disease with (acute) exacerbation: Secondary | ICD-10-CM | POA: Diagnosis not present

## 2022-02-04 DIAGNOSIS — F32A Depression, unspecified: Secondary | ICD-10-CM | POA: Diagnosis not present

## 2022-02-04 DIAGNOSIS — R791 Abnormal coagulation profile: Secondary | ICD-10-CM | POA: Diagnosis not present

## 2022-02-04 DIAGNOSIS — M1990 Unspecified osteoarthritis, unspecified site: Secondary | ICD-10-CM | POA: Diagnosis not present

## 2022-02-04 DIAGNOSIS — L03116 Cellulitis of left lower limb: Secondary | ICD-10-CM | POA: Diagnosis not present

## 2022-02-04 DIAGNOSIS — I25119 Atherosclerotic heart disease of native coronary artery with unspecified angina pectoris: Secondary | ICD-10-CM | POA: Diagnosis not present

## 2022-02-04 DIAGNOSIS — I872 Venous insufficiency (chronic) (peripheral): Secondary | ICD-10-CM | POA: Diagnosis not present

## 2022-02-04 DIAGNOSIS — I5022 Chronic systolic (congestive) heart failure: Secondary | ICD-10-CM | POA: Diagnosis not present

## 2022-02-04 DIAGNOSIS — L97829 Non-pressure chronic ulcer of other part of left lower leg with unspecified severity: Secondary | ICD-10-CM | POA: Diagnosis not present

## 2022-02-04 LAB — POCT INR: INR: 8 — AB (ref 2.0–3.0)

## 2022-02-04 MED ORDER — PHYTONADIONE 5 MG PO TABS
2.5000 mg | ORAL_TABLET | Freq: Once | ORAL | 0 refills | Status: AC
  Filled 2022-02-04: qty 1, 2d supply, fill #0

## 2022-02-04 MED ORDER — PHYTONADIONE 5 MG PO TABS
ORAL_TABLET | ORAL | 0 refills | Status: DC
  Filled 2022-02-04: qty 1, 1d supply, fill #0

## 2022-02-04 NOTE — Progress Notes (Addendum)
[name] from [facility] called with INR results. Her machine is reading >8.  The patient also reports dark stools that began this am. No other sign or symptoms of obvious bleeding. Instructed pt to go to ED, pt refuses. Pt was adamantly encouraged to go to ED but still refuses.   [name] is going to draw a lab INR and CBC to be sent to [place] for verification. Pt instructed to hold all doses of warfarin.  Spoke with Dr. Damita Dunnings, and he advised that patient take Vitamin K 2.5 mg po tonight and have home health nurse recheck POC INR tomorrow. Prescription sent to [facility] outpatient pharmacy (that is the closest pharmacy I could find that had Vit K). Called these instructions to [name], she verbalizes understanding. She spoke with patient and he has no one who could pick up Vitamin K for him tonight and wanted to know if it could wait until tomorrow. She will return to patient's residence tomorrow and call me.   Pt not answering my calls, left voicemail to call me back.

## 2022-02-05 ENCOUNTER — Other Ambulatory Visit (HOSPITAL_COMMUNITY): Payer: Self-pay

## 2022-02-05 ENCOUNTER — Telehealth: Payer: Self-pay

## 2022-02-05 NOTE — Telephone Encounter (Signed)
Received the following critical results from [place]:   PT: 76.1 INR: 8.2  Results do not appear in [facility].  [facility] nurse also drew a CBC. [facility] rep reports CBC had no critical values but did have the following results out of range:  WBC: 11.7 RBC: 3.66 HGB: 8.0 HCT: 27.6 MCV: 75 MCH: 21.9 MCHC: 29.0 RDW: 19.4 NEUTRO ABS%: 8.3   Platelets were in range at 216.  Pt has been instructed to hold all doses of warfarin until after he and the [facility] nurse speak to anticoagulation clinic today.

## 2022-02-05 NOTE — Telephone Encounter (Signed)
Called and spoke to patient.  Informed him his lab INR came back at 8.2. Pt states he "feels better than I have in months" and denies any signs of bleeding. Reminded pt to call EMS and go to ED for any s/s of bleeding. Verbalized understanding.   Pt denies having any dark stools today. Instructed patient to continue to hold warfarin through the weekend. Pt states that he now has someone who could pick up Vit K tablet from [place]. Instructed patient to keep the Vit K tablet in his medicine cabinet for potential future use. Do not take today or this weekend. Verbalized understanding.   Also spoke to [facility] nurse, [name].  She can not visit patient today, but will return to his home on Monday, 10/16 for a repeat INR check. Updated [name] with lab results.

## 2022-02-06 DIAGNOSIS — I420 Dilated cardiomyopathy: Secondary | ICD-10-CM | POA: Diagnosis not present

## 2022-02-06 DIAGNOSIS — I110 Hypertensive heart disease with heart failure: Secondary | ICD-10-CM | POA: Diagnosis not present

## 2022-02-06 DIAGNOSIS — K219 Gastro-esophageal reflux disease without esophagitis: Secondary | ICD-10-CM | POA: Diagnosis not present

## 2022-02-06 DIAGNOSIS — G4733 Obstructive sleep apnea (adult) (pediatric): Secondary | ICD-10-CM | POA: Diagnosis not present

## 2022-02-06 DIAGNOSIS — G43909 Migraine, unspecified, not intractable, without status migrainosus: Secondary | ICD-10-CM | POA: Diagnosis not present

## 2022-02-06 DIAGNOSIS — Z5181 Encounter for therapeutic drug level monitoring: Secondary | ICD-10-CM | POA: Diagnosis not present

## 2022-02-06 DIAGNOSIS — I252 Old myocardial infarction: Secondary | ICD-10-CM | POA: Diagnosis not present

## 2022-02-06 DIAGNOSIS — L03116 Cellulitis of left lower limb: Secondary | ICD-10-CM | POA: Diagnosis not present

## 2022-02-06 DIAGNOSIS — E876 Hypokalemia: Secondary | ICD-10-CM | POA: Diagnosis not present

## 2022-02-06 DIAGNOSIS — I872 Venous insufficiency (chronic) (peripheral): Secondary | ICD-10-CM | POA: Diagnosis not present

## 2022-02-06 DIAGNOSIS — L97829 Non-pressure chronic ulcer of other part of left lower leg with unspecified severity: Secondary | ICD-10-CM | POA: Diagnosis not present

## 2022-02-06 DIAGNOSIS — L97229 Non-pressure chronic ulcer of left calf with unspecified severity: Secondary | ICD-10-CM | POA: Diagnosis not present

## 2022-02-06 DIAGNOSIS — E1165 Type 2 diabetes mellitus with hyperglycemia: Secondary | ICD-10-CM | POA: Diagnosis not present

## 2022-02-06 DIAGNOSIS — I25119 Atherosclerotic heart disease of native coronary artery with unspecified angina pectoris: Secondary | ICD-10-CM | POA: Diagnosis not present

## 2022-02-06 DIAGNOSIS — I4891 Unspecified atrial fibrillation: Secondary | ICD-10-CM | POA: Diagnosis not present

## 2022-02-06 DIAGNOSIS — E785 Hyperlipidemia, unspecified: Secondary | ICD-10-CM | POA: Diagnosis not present

## 2022-02-06 DIAGNOSIS — L03115 Cellulitis of right lower limb: Secondary | ICD-10-CM | POA: Diagnosis not present

## 2022-02-06 DIAGNOSIS — F32A Depression, unspecified: Secondary | ICD-10-CM | POA: Diagnosis not present

## 2022-02-06 DIAGNOSIS — M1990 Unspecified osteoarthritis, unspecified site: Secondary | ICD-10-CM | POA: Diagnosis not present

## 2022-02-06 DIAGNOSIS — Z9181 History of falling: Secondary | ICD-10-CM | POA: Diagnosis not present

## 2022-02-06 DIAGNOSIS — J441 Chronic obstructive pulmonary disease with (acute) exacerbation: Secondary | ICD-10-CM | POA: Diagnosis not present

## 2022-02-06 DIAGNOSIS — I5033 Acute on chronic diastolic (congestive) heart failure: Secondary | ICD-10-CM | POA: Diagnosis not present

## 2022-02-06 DIAGNOSIS — I5022 Chronic systolic (congestive) heart failure: Secondary | ICD-10-CM | POA: Diagnosis not present

## 2022-02-08 ENCOUNTER — Other Ambulatory Visit (HOSPITAL_COMMUNITY): Payer: Self-pay

## 2022-02-08 ENCOUNTER — Ambulatory Visit (INDEPENDENT_AMBULATORY_CARE_PROVIDER_SITE_OTHER): Payer: Medicare Other

## 2022-02-08 DIAGNOSIS — Z5181 Encounter for therapeutic drug level monitoring: Secondary | ICD-10-CM | POA: Diagnosis not present

## 2022-02-08 DIAGNOSIS — Z7901 Long term (current) use of anticoagulants: Secondary | ICD-10-CM

## 2022-02-08 DIAGNOSIS — I25119 Atherosclerotic heart disease of native coronary artery with unspecified angina pectoris: Secondary | ICD-10-CM | POA: Diagnosis not present

## 2022-02-08 DIAGNOSIS — G43909 Migraine, unspecified, not intractable, without status migrainosus: Secondary | ICD-10-CM | POA: Diagnosis not present

## 2022-02-08 DIAGNOSIS — Z9181 History of falling: Secondary | ICD-10-CM | POA: Diagnosis not present

## 2022-02-08 DIAGNOSIS — I110 Hypertensive heart disease with heart failure: Secondary | ICD-10-CM | POA: Diagnosis not present

## 2022-02-08 DIAGNOSIS — I5022 Chronic systolic (congestive) heart failure: Secondary | ICD-10-CM | POA: Diagnosis not present

## 2022-02-08 DIAGNOSIS — I252 Old myocardial infarction: Secondary | ICD-10-CM | POA: Diagnosis not present

## 2022-02-08 DIAGNOSIS — E785 Hyperlipidemia, unspecified: Secondary | ICD-10-CM | POA: Diagnosis not present

## 2022-02-08 DIAGNOSIS — L03116 Cellulitis of left lower limb: Secondary | ICD-10-CM | POA: Diagnosis not present

## 2022-02-08 DIAGNOSIS — I5033 Acute on chronic diastolic (congestive) heart failure: Secondary | ICD-10-CM | POA: Diagnosis not present

## 2022-02-08 DIAGNOSIS — G4733 Obstructive sleep apnea (adult) (pediatric): Secondary | ICD-10-CM | POA: Diagnosis not present

## 2022-02-08 DIAGNOSIS — L97229 Non-pressure chronic ulcer of left calf with unspecified severity: Secondary | ICD-10-CM | POA: Diagnosis not present

## 2022-02-08 DIAGNOSIS — I4891 Unspecified atrial fibrillation: Secondary | ICD-10-CM | POA: Diagnosis not present

## 2022-02-08 DIAGNOSIS — M1990 Unspecified osteoarthritis, unspecified site: Secondary | ICD-10-CM | POA: Diagnosis not present

## 2022-02-08 DIAGNOSIS — E1165 Type 2 diabetes mellitus with hyperglycemia: Secondary | ICD-10-CM | POA: Diagnosis not present

## 2022-02-08 DIAGNOSIS — E876 Hypokalemia: Secondary | ICD-10-CM | POA: Diagnosis not present

## 2022-02-08 DIAGNOSIS — I420 Dilated cardiomyopathy: Secondary | ICD-10-CM | POA: Diagnosis not present

## 2022-02-08 DIAGNOSIS — L97829 Non-pressure chronic ulcer of other part of left lower leg with unspecified severity: Secondary | ICD-10-CM | POA: Diagnosis not present

## 2022-02-08 DIAGNOSIS — K219 Gastro-esophageal reflux disease without esophagitis: Secondary | ICD-10-CM | POA: Diagnosis not present

## 2022-02-08 DIAGNOSIS — I872 Venous insufficiency (chronic) (peripheral): Secondary | ICD-10-CM | POA: Diagnosis not present

## 2022-02-08 DIAGNOSIS — J441 Chronic obstructive pulmonary disease with (acute) exacerbation: Secondary | ICD-10-CM | POA: Diagnosis not present

## 2022-02-08 DIAGNOSIS — F32A Depression, unspecified: Secondary | ICD-10-CM | POA: Diagnosis not present

## 2022-02-08 DIAGNOSIS — L03115 Cellulitis of right lower limb: Secondary | ICD-10-CM | POA: Diagnosis not present

## 2022-02-08 LAB — POCT INR: INR: 2 (ref 2.0–3.0)

## 2022-02-08 NOTE — Progress Notes (Signed)
[name] from [facility] called with INR result of 2.0 today. Pt and [name] instructed to change weekly dosing to 0.5 tablets (2.5 mg) once a day except take no warfarin on Sundays or Wednesdays. Both verbalized understanding. Recheck in 1 week.   Pt reports that he could have accidentally taken extra doses of amiodarone last week.

## 2022-02-08 NOTE — Patient Instructions (Signed)
Change weekly dosing to 0.5 tablets (2.5 mg total) once a day except take no warfarin on Sundays or Wednesdays. Recheck in 1 week.

## 2022-02-10 ENCOUNTER — Ambulatory Visit: Payer: Medicare Other | Admitting: Cardiology

## 2022-02-10 DIAGNOSIS — J441 Chronic obstructive pulmonary disease with (acute) exacerbation: Secondary | ICD-10-CM | POA: Diagnosis not present

## 2022-02-10 DIAGNOSIS — G4733 Obstructive sleep apnea (adult) (pediatric): Secondary | ICD-10-CM | POA: Diagnosis not present

## 2022-02-10 DIAGNOSIS — E876 Hypokalemia: Secondary | ICD-10-CM | POA: Diagnosis not present

## 2022-02-10 DIAGNOSIS — Z9181 History of falling: Secondary | ICD-10-CM | POA: Diagnosis not present

## 2022-02-10 DIAGNOSIS — L97829 Non-pressure chronic ulcer of other part of left lower leg with unspecified severity: Secondary | ICD-10-CM | POA: Diagnosis not present

## 2022-02-10 DIAGNOSIS — I5033 Acute on chronic diastolic (congestive) heart failure: Secondary | ICD-10-CM | POA: Diagnosis not present

## 2022-02-10 DIAGNOSIS — I110 Hypertensive heart disease with heart failure: Secondary | ICD-10-CM | POA: Diagnosis not present

## 2022-02-10 DIAGNOSIS — E1165 Type 2 diabetes mellitus with hyperglycemia: Secondary | ICD-10-CM | POA: Diagnosis not present

## 2022-02-10 DIAGNOSIS — L03116 Cellulitis of left lower limb: Secondary | ICD-10-CM | POA: Diagnosis not present

## 2022-02-10 DIAGNOSIS — F32A Depression, unspecified: Secondary | ICD-10-CM | POA: Diagnosis not present

## 2022-02-10 DIAGNOSIS — K219 Gastro-esophageal reflux disease without esophagitis: Secondary | ICD-10-CM | POA: Diagnosis not present

## 2022-02-10 DIAGNOSIS — E785 Hyperlipidemia, unspecified: Secondary | ICD-10-CM | POA: Diagnosis not present

## 2022-02-10 DIAGNOSIS — L03115 Cellulitis of right lower limb: Secondary | ICD-10-CM | POA: Diagnosis not present

## 2022-02-10 DIAGNOSIS — I872 Venous insufficiency (chronic) (peripheral): Secondary | ICD-10-CM | POA: Diagnosis not present

## 2022-02-10 DIAGNOSIS — I4891 Unspecified atrial fibrillation: Secondary | ICD-10-CM | POA: Diagnosis not present

## 2022-02-10 DIAGNOSIS — L97229 Non-pressure chronic ulcer of left calf with unspecified severity: Secondary | ICD-10-CM | POA: Diagnosis not present

## 2022-02-10 DIAGNOSIS — M1990 Unspecified osteoarthritis, unspecified site: Secondary | ICD-10-CM | POA: Diagnosis not present

## 2022-02-10 DIAGNOSIS — I5022 Chronic systolic (congestive) heart failure: Secondary | ICD-10-CM | POA: Diagnosis not present

## 2022-02-10 DIAGNOSIS — Z5181 Encounter for therapeutic drug level monitoring: Secondary | ICD-10-CM | POA: Diagnosis not present

## 2022-02-10 DIAGNOSIS — G43909 Migraine, unspecified, not intractable, without status migrainosus: Secondary | ICD-10-CM | POA: Diagnosis not present

## 2022-02-10 DIAGNOSIS — I25119 Atherosclerotic heart disease of native coronary artery with unspecified angina pectoris: Secondary | ICD-10-CM | POA: Diagnosis not present

## 2022-02-10 DIAGNOSIS — I252 Old myocardial infarction: Secondary | ICD-10-CM | POA: Diagnosis not present

## 2022-02-10 DIAGNOSIS — I420 Dilated cardiomyopathy: Secondary | ICD-10-CM | POA: Diagnosis not present

## 2022-02-12 ENCOUNTER — Ambulatory Visit: Payer: Self-pay

## 2022-02-12 ENCOUNTER — Telehealth: Payer: Self-pay

## 2022-02-12 NOTE — Patient Outreach (Signed)
  Care Coordination   02/12/2022 Name: [name] MRN: [id number] DOB: [birth year]   Care Coordination Outreach Attempts:  An unsuccessful telephone outreach was attempted for a scheduled appointment today.  Follow Up Plan:  Additional outreach attempts will be made to offer the patient care coordination information and services.   Encounter Outcome:  No Answer  Care Coordination Interventions Activated:  No   Care Coordination Interventions:  No, not indicated    Quinn Plowman RN,BSN,CCM [place] [phone] direct line

## 2022-02-13 DIAGNOSIS — Z5181 Encounter for therapeutic drug level monitoring: Secondary | ICD-10-CM | POA: Diagnosis not present

## 2022-02-13 DIAGNOSIS — G43909 Migraine, unspecified, not intractable, without status migrainosus: Secondary | ICD-10-CM | POA: Diagnosis not present

## 2022-02-13 DIAGNOSIS — I4891 Unspecified atrial fibrillation: Secondary | ICD-10-CM | POA: Diagnosis not present

## 2022-02-13 DIAGNOSIS — I252 Old myocardial infarction: Secondary | ICD-10-CM | POA: Diagnosis not present

## 2022-02-13 DIAGNOSIS — J9601 Acute respiratory failure with hypoxia: Secondary | ICD-10-CM | POA: Diagnosis not present

## 2022-02-13 DIAGNOSIS — L97229 Non-pressure chronic ulcer of left calf with unspecified severity: Secondary | ICD-10-CM | POA: Diagnosis not present

## 2022-02-13 DIAGNOSIS — I25119 Atherosclerotic heart disease of native coronary artery with unspecified angina pectoris: Secondary | ICD-10-CM | POA: Diagnosis not present

## 2022-02-13 DIAGNOSIS — M1990 Unspecified osteoarthritis, unspecified site: Secondary | ICD-10-CM | POA: Diagnosis not present

## 2022-02-13 DIAGNOSIS — G4733 Obstructive sleep apnea (adult) (pediatric): Secondary | ICD-10-CM | POA: Diagnosis not present

## 2022-02-13 DIAGNOSIS — E1165 Type 2 diabetes mellitus with hyperglycemia: Secondary | ICD-10-CM | POA: Diagnosis not present

## 2022-02-13 DIAGNOSIS — K219 Gastro-esophageal reflux disease without esophagitis: Secondary | ICD-10-CM | POA: Diagnosis not present

## 2022-02-13 DIAGNOSIS — L03115 Cellulitis of right lower limb: Secondary | ICD-10-CM | POA: Diagnosis not present

## 2022-02-13 DIAGNOSIS — L97829 Non-pressure chronic ulcer of other part of left lower leg with unspecified severity: Secondary | ICD-10-CM | POA: Diagnosis not present

## 2022-02-13 DIAGNOSIS — E785 Hyperlipidemia, unspecified: Secondary | ICD-10-CM | POA: Diagnosis not present

## 2022-02-13 DIAGNOSIS — I5022 Chronic systolic (congestive) heart failure: Secondary | ICD-10-CM | POA: Diagnosis not present

## 2022-02-13 DIAGNOSIS — E876 Hypokalemia: Secondary | ICD-10-CM | POA: Diagnosis not present

## 2022-02-13 DIAGNOSIS — I872 Venous insufficiency (chronic) (peripheral): Secondary | ICD-10-CM | POA: Diagnosis not present

## 2022-02-13 DIAGNOSIS — I5033 Acute on chronic diastolic (congestive) heart failure: Secondary | ICD-10-CM | POA: Diagnosis not present

## 2022-02-13 DIAGNOSIS — I110 Hypertensive heart disease with heart failure: Secondary | ICD-10-CM | POA: Diagnosis not present

## 2022-02-13 DIAGNOSIS — F32A Depression, unspecified: Secondary | ICD-10-CM | POA: Diagnosis not present

## 2022-02-13 DIAGNOSIS — Z9181 History of falling: Secondary | ICD-10-CM | POA: Diagnosis not present

## 2022-02-13 DIAGNOSIS — J441 Chronic obstructive pulmonary disease with (acute) exacerbation: Secondary | ICD-10-CM | POA: Diagnosis not present

## 2022-02-13 DIAGNOSIS — I420 Dilated cardiomyopathy: Secondary | ICD-10-CM | POA: Diagnosis not present

## 2022-02-13 DIAGNOSIS — L03116 Cellulitis of left lower limb: Secondary | ICD-10-CM | POA: Diagnosis not present

## 2022-02-13 NOTE — Patient Outreach (Signed)
  Care Coordination   02/13/2022 Late entry for 02/12/22 Name: [name] MRN: [id number] DOB: [birth year]   Care Coordination Outreach Attempts:  An unsuccessful telephone outreach was attempted today to offer the patient information about available care coordination services as a benefit of their health plan.  Attempted return call to patient. Unable to reach. HIPAA compliant voice message left with call back number.   Follow Up Plan:  Additional outreach attempts will be made to offer the patient care coordination information and services.   Encounter Outcome:  No Answer  Care Coordination Interventions Activated:  No   Care Coordination Interventions:  No, not indicated    Quinn Plowman RN,BSN,CCM [place] [phone] direct line

## 2022-02-15 DIAGNOSIS — L03115 Cellulitis of right lower limb: Secondary | ICD-10-CM | POA: Diagnosis not present

## 2022-02-15 DIAGNOSIS — Z5181 Encounter for therapeutic drug level monitoring: Secondary | ICD-10-CM | POA: Diagnosis not present

## 2022-02-15 DIAGNOSIS — G43909 Migraine, unspecified, not intractable, without status migrainosus: Secondary | ICD-10-CM | POA: Diagnosis not present

## 2022-02-15 DIAGNOSIS — I5033 Acute on chronic diastolic (congestive) heart failure: Secondary | ICD-10-CM | POA: Diagnosis not present

## 2022-02-15 DIAGNOSIS — K219 Gastro-esophageal reflux disease without esophagitis: Secondary | ICD-10-CM | POA: Diagnosis not present

## 2022-02-15 DIAGNOSIS — M1990 Unspecified osteoarthritis, unspecified site: Secondary | ICD-10-CM | POA: Diagnosis not present

## 2022-02-15 DIAGNOSIS — I4892 Unspecified atrial flutter: Secondary | ICD-10-CM | POA: Diagnosis not present

## 2022-02-15 DIAGNOSIS — E785 Hyperlipidemia, unspecified: Secondary | ICD-10-CM | POA: Diagnosis not present

## 2022-02-15 DIAGNOSIS — I872 Venous insufficiency (chronic) (peripheral): Secondary | ICD-10-CM | POA: Diagnosis not present

## 2022-02-15 DIAGNOSIS — I420 Dilated cardiomyopathy: Secondary | ICD-10-CM | POA: Diagnosis not present

## 2022-02-15 DIAGNOSIS — I110 Hypertensive heart disease with heart failure: Secondary | ICD-10-CM | POA: Diagnosis not present

## 2022-02-15 DIAGNOSIS — I252 Old myocardial infarction: Secondary | ICD-10-CM | POA: Diagnosis not present

## 2022-02-15 DIAGNOSIS — F32A Depression, unspecified: Secondary | ICD-10-CM | POA: Diagnosis not present

## 2022-02-15 DIAGNOSIS — I2510 Atherosclerotic heart disease of native coronary artery without angina pectoris: Secondary | ICD-10-CM | POA: Diagnosis not present

## 2022-02-15 DIAGNOSIS — L97229 Non-pressure chronic ulcer of left calf with unspecified severity: Secondary | ICD-10-CM | POA: Diagnosis not present

## 2022-02-15 DIAGNOSIS — E876 Hypokalemia: Secondary | ICD-10-CM | POA: Diagnosis not present

## 2022-02-15 DIAGNOSIS — I25119 Atherosclerotic heart disease of native coronary artery with unspecified angina pectoris: Secondary | ICD-10-CM | POA: Diagnosis not present

## 2022-02-15 DIAGNOSIS — Z9181 History of falling: Secondary | ICD-10-CM | POA: Diagnosis not present

## 2022-02-15 DIAGNOSIS — L03116 Cellulitis of left lower limb: Secondary | ICD-10-CM | POA: Diagnosis not present

## 2022-02-15 DIAGNOSIS — L97829 Non-pressure chronic ulcer of other part of left lower leg with unspecified severity: Secondary | ICD-10-CM | POA: Diagnosis not present

## 2022-02-15 DIAGNOSIS — Z79899 Other long term (current) drug therapy: Secondary | ICD-10-CM | POA: Diagnosis not present

## 2022-02-15 DIAGNOSIS — I5022 Chronic systolic (congestive) heart failure: Secondary | ICD-10-CM | POA: Diagnosis not present

## 2022-02-15 DIAGNOSIS — E1165 Type 2 diabetes mellitus with hyperglycemia: Secondary | ICD-10-CM | POA: Diagnosis not present

## 2022-02-15 DIAGNOSIS — J441 Chronic obstructive pulmonary disease with (acute) exacerbation: Secondary | ICD-10-CM | POA: Diagnosis not present

## 2022-02-15 DIAGNOSIS — I4891 Unspecified atrial fibrillation: Secondary | ICD-10-CM | POA: Diagnosis not present

## 2022-02-15 DIAGNOSIS — G4733 Obstructive sleep apnea (adult) (pediatric): Secondary | ICD-10-CM | POA: Diagnosis not present

## 2022-02-15 LAB — POCT INR: INR: 1.8 — AB (ref 2.0–3.0)

## 2022-02-16 ENCOUNTER — Ambulatory Visit (INDEPENDENT_AMBULATORY_CARE_PROVIDER_SITE_OTHER): Payer: Medicare Other

## 2022-02-16 DIAGNOSIS — Z7901 Long term (current) use of anticoagulants: Secondary | ICD-10-CM | POA: Diagnosis not present

## 2022-02-16 NOTE — Patient Instructions (Signed)
Increase dose today to 1 tablet then resume weekly dosing of 0.5 tablets (2.5 mg) once a day except take no warfarin on Sundays or Wednesdays. Recheck in 1 week.

## 2022-02-16 NOTE — Progress Notes (Signed)
[name] from [facility] called with INR result of 1.8 from yesterday evening. Pt and [name] instructed to increase dose today to 1 tablet then resume weekly dosing of 0.5 tablets (2.5 mg) once a day except take no warfarin on Sundays or Wednesdays. Recheck in 1 week.   Pt reports that he has been taking Amiodarone 200 mg twice daily.  Pt was prescribed Amiodarone 200 mg once daily.  Pt saw cardiologist yesterday and confirmed that he is to take Amiodarone 200 mg once daily.

## 2022-02-17 ENCOUNTER — Telehealth: Payer: Self-pay | Admitting: Family Medicine

## 2022-02-17 DIAGNOSIS — Z9981 Dependence on supplemental oxygen: Secondary | ICD-10-CM

## 2022-02-17 DIAGNOSIS — Z7984 Long term (current) use of oral hypoglycemic drugs: Secondary | ICD-10-CM

## 2022-02-17 DIAGNOSIS — L03116 Cellulitis of left lower limb: Secondary | ICD-10-CM | POA: Diagnosis not present

## 2022-02-17 DIAGNOSIS — I25119 Atherosclerotic heart disease of native coronary artery with unspecified angina pectoris: Secondary | ICD-10-CM | POA: Diagnosis not present

## 2022-02-17 DIAGNOSIS — I5022 Chronic systolic (congestive) heart failure: Secondary | ICD-10-CM | POA: Diagnosis not present

## 2022-02-17 DIAGNOSIS — E876 Hypokalemia: Secondary | ICD-10-CM

## 2022-02-17 DIAGNOSIS — L03115 Cellulitis of right lower limb: Secondary | ICD-10-CM | POA: Diagnosis not present

## 2022-02-17 DIAGNOSIS — F419 Anxiety disorder, unspecified: Secondary | ICD-10-CM

## 2022-02-17 DIAGNOSIS — J441 Chronic obstructive pulmonary disease with (acute) exacerbation: Secondary | ICD-10-CM | POA: Diagnosis not present

## 2022-02-17 DIAGNOSIS — I110 Hypertensive heart disease with heart failure: Secondary | ICD-10-CM | POA: Diagnosis not present

## 2022-02-17 DIAGNOSIS — Z5181 Encounter for therapeutic drug level monitoring: Secondary | ICD-10-CM

## 2022-02-17 DIAGNOSIS — I420 Dilated cardiomyopathy: Secondary | ICD-10-CM | POA: Diagnosis not present

## 2022-02-17 DIAGNOSIS — G4733 Obstructive sleep apnea (adult) (pediatric): Secondary | ICD-10-CM

## 2022-02-17 DIAGNOSIS — I872 Venous insufficiency (chronic) (peripheral): Secondary | ICD-10-CM | POA: Diagnosis not present

## 2022-02-17 DIAGNOSIS — I5033 Acute on chronic diastolic (congestive) heart failure: Secondary | ICD-10-CM | POA: Diagnosis not present

## 2022-02-17 DIAGNOSIS — Z7901 Long term (current) use of anticoagulants: Secondary | ICD-10-CM

## 2022-02-17 DIAGNOSIS — L97229 Non-pressure chronic ulcer of left calf with unspecified severity: Secondary | ICD-10-CM | POA: Diagnosis not present

## 2022-02-17 DIAGNOSIS — E1165 Type 2 diabetes mellitus with hyperglycemia: Secondary | ICD-10-CM

## 2022-02-17 DIAGNOSIS — G43909 Migraine, unspecified, not intractable, without status migrainosus: Secondary | ICD-10-CM

## 2022-02-17 DIAGNOSIS — Z6841 Body Mass Index (BMI) 40.0 and over, adult: Secondary | ICD-10-CM

## 2022-02-17 DIAGNOSIS — I4891 Unspecified atrial fibrillation: Secondary | ICD-10-CM

## 2022-02-17 DIAGNOSIS — K219 Gastro-esophageal reflux disease without esophagitis: Secondary | ICD-10-CM

## 2022-02-17 DIAGNOSIS — Z9181 History of falling: Secondary | ICD-10-CM

## 2022-02-17 DIAGNOSIS — E785 Hyperlipidemia, unspecified: Secondary | ICD-10-CM

## 2022-02-17 DIAGNOSIS — L97829 Non-pressure chronic ulcer of other part of left lower leg with unspecified severity: Secondary | ICD-10-CM | POA: Diagnosis not present

## 2022-02-17 DIAGNOSIS — I252 Old myocardial infarction: Secondary | ICD-10-CM | POA: Diagnosis not present

## 2022-02-17 DIAGNOSIS — F32A Depression, unspecified: Secondary | ICD-10-CM

## 2022-02-17 DIAGNOSIS — Z87891 Personal history of nicotine dependence: Secondary | ICD-10-CM

## 2022-02-17 DIAGNOSIS — M1990 Unspecified osteoarthritis, unspecified site: Secondary | ICD-10-CM

## 2022-02-17 NOTE — Telephone Encounter (Signed)
Patient called in today to get an appointment for ongoing breathing issues that he's having. I offered access nurse and triage to him,and he declined. He said that he just want to see the doctor. So I have him scheduled for Friday oct 27 ,2023.

## 2022-02-19 ENCOUNTER — Ambulatory Visit (INDEPENDENT_AMBULATORY_CARE_PROVIDER_SITE_OTHER): Payer: Medicare Other | Admitting: Family Medicine

## 2022-02-19 ENCOUNTER — Telehealth: Payer: Self-pay

## 2022-02-19 ENCOUNTER — Encounter: Payer: Self-pay | Admitting: Family Medicine

## 2022-02-19 VITALS — BP 130/64 | HR 66 | Temp 97.4°F | Ht 71.0 in | Wt 262.5 lb

## 2022-02-19 DIAGNOSIS — L97229 Non-pressure chronic ulcer of left calf with unspecified severity: Secondary | ICD-10-CM | POA: Diagnosis not present

## 2022-02-19 DIAGNOSIS — D508 Other iron deficiency anemias: Secondary | ICD-10-CM

## 2022-02-19 DIAGNOSIS — G43909 Migraine, unspecified, not intractable, without status migrainosus: Secondary | ICD-10-CM | POA: Diagnosis not present

## 2022-02-19 DIAGNOSIS — I5033 Acute on chronic diastolic (congestive) heart failure: Secondary | ICD-10-CM | POA: Diagnosis not present

## 2022-02-19 DIAGNOSIS — J9612 Chronic respiratory failure with hypercapnia: Secondary | ICD-10-CM

## 2022-02-19 DIAGNOSIS — Z9181 History of falling: Secondary | ICD-10-CM | POA: Diagnosis not present

## 2022-02-19 DIAGNOSIS — E876 Hypokalemia: Secondary | ICD-10-CM | POA: Diagnosis not present

## 2022-02-19 DIAGNOSIS — D689 Coagulation defect, unspecified: Secondary | ICD-10-CM

## 2022-02-19 DIAGNOSIS — J441 Chronic obstructive pulmonary disease with (acute) exacerbation: Secondary | ICD-10-CM | POA: Diagnosis not present

## 2022-02-19 DIAGNOSIS — I5032 Chronic diastolic (congestive) heart failure: Secondary | ICD-10-CM | POA: Diagnosis not present

## 2022-02-19 DIAGNOSIS — J9611 Chronic respiratory failure with hypoxia: Secondary | ICD-10-CM

## 2022-02-19 DIAGNOSIS — I110 Hypertensive heart disease with heart failure: Secondary | ICD-10-CM | POA: Diagnosis not present

## 2022-02-19 DIAGNOSIS — I420 Dilated cardiomyopathy: Secondary | ICD-10-CM | POA: Diagnosis not present

## 2022-02-19 DIAGNOSIS — L03115 Cellulitis of right lower limb: Secondary | ICD-10-CM | POA: Diagnosis not present

## 2022-02-19 DIAGNOSIS — J432 Centrilobular emphysema: Secondary | ICD-10-CM

## 2022-02-19 DIAGNOSIS — K219 Gastro-esophageal reflux disease without esophagitis: Secondary | ICD-10-CM | POA: Diagnosis not present

## 2022-02-19 DIAGNOSIS — I50810 Right heart failure, unspecified: Secondary | ICD-10-CM | POA: Diagnosis not present

## 2022-02-19 DIAGNOSIS — E1169 Type 2 diabetes mellitus with other specified complication: Secondary | ICD-10-CM | POA: Diagnosis not present

## 2022-02-19 DIAGNOSIS — G4733 Obstructive sleep apnea (adult) (pediatric): Secondary | ICD-10-CM

## 2022-02-19 DIAGNOSIS — F410 Panic disorder [episodic paroxysmal anxiety] without agoraphobia: Secondary | ICD-10-CM

## 2022-02-19 DIAGNOSIS — R0609 Other forms of dyspnea: Secondary | ICD-10-CM | POA: Diagnosis not present

## 2022-02-19 DIAGNOSIS — I5022 Chronic systolic (congestive) heart failure: Secondary | ICD-10-CM | POA: Diagnosis not present

## 2022-02-19 DIAGNOSIS — F32A Depression, unspecified: Secondary | ICD-10-CM | POA: Diagnosis not present

## 2022-02-19 DIAGNOSIS — I4891 Unspecified atrial fibrillation: Secondary | ICD-10-CM | POA: Diagnosis not present

## 2022-02-19 DIAGNOSIS — E785 Hyperlipidemia, unspecified: Secondary | ICD-10-CM | POA: Diagnosis not present

## 2022-02-19 DIAGNOSIS — I4819 Other persistent atrial fibrillation: Secondary | ICD-10-CM

## 2022-02-19 DIAGNOSIS — L03116 Cellulitis of left lower limb: Secondary | ICD-10-CM | POA: Diagnosis not present

## 2022-02-19 DIAGNOSIS — E1165 Type 2 diabetes mellitus with hyperglycemia: Secondary | ICD-10-CM | POA: Diagnosis not present

## 2022-02-19 DIAGNOSIS — H538 Other visual disturbances: Secondary | ICD-10-CM

## 2022-02-19 DIAGNOSIS — I25119 Atherosclerotic heart disease of native coronary artery with unspecified angina pectoris: Secondary | ICD-10-CM | POA: Diagnosis not present

## 2022-02-19 DIAGNOSIS — L97829 Non-pressure chronic ulcer of other part of left lower leg with unspecified severity: Secondary | ICD-10-CM | POA: Diagnosis not present

## 2022-02-19 DIAGNOSIS — I872 Venous insufficiency (chronic) (peripheral): Secondary | ICD-10-CM | POA: Diagnosis not present

## 2022-02-19 DIAGNOSIS — I10 Essential (primary) hypertension: Secondary | ICD-10-CM

## 2022-02-19 DIAGNOSIS — F331 Major depressive disorder, recurrent, moderate: Secondary | ICD-10-CM | POA: Diagnosis not present

## 2022-02-19 DIAGNOSIS — I252 Old myocardial infarction: Secondary | ICD-10-CM | POA: Diagnosis not present

## 2022-02-19 DIAGNOSIS — M1990 Unspecified osteoarthritis, unspecified site: Secondary | ICD-10-CM | POA: Diagnosis not present

## 2022-02-19 DIAGNOSIS — Z5181 Encounter for therapeutic drug level monitoring: Secondary | ICD-10-CM | POA: Diagnosis not present

## 2022-02-19 LAB — IBC PANEL
Iron: 16 ug/dL — ABNORMAL LOW (ref 42–165)
Saturation Ratios: 3.5 % — ABNORMAL LOW (ref 20.0–50.0)
TIBC: 452.2 ug/dL — ABNORMAL HIGH (ref 250.0–450.0)
Transferrin: 323 mg/dL (ref 212.0–360.0)

## 2022-02-19 LAB — BASIC METABOLIC PANEL
BUN: 16 mg/dL (ref 6–23)
CO2: 41 mEq/L — ABNORMAL HIGH (ref 19–32)
Calcium: 9.8 mg/dL (ref 8.4–10.5)
Chloride: 93 mEq/L — ABNORMAL LOW (ref 96–112)
Creatinine, Ser: 0.89 mg/dL (ref 0.40–1.50)
GFR: 89.35 mL/min (ref >60.00)
Glucose, Bld: 129 mg/dL — ABNORMAL HIGH (ref 70–99)
Potassium: 4.2 mEq/L (ref 3.5–5.1)
Sodium: 141 mEq/L (ref 135–145)

## 2022-02-19 LAB — CBC WITH DIFFERENTIAL/PLATELET
Basophils Absolute: 0.1 10*3/uL (ref 0.0–0.1)
Basophils Relative: 0.8 % (ref 0.0–3.0)
Eosinophils Absolute: 0.1 10*3/uL (ref 0.0–0.7)
Eosinophils Relative: 1.3 % (ref 0.0–5.0)
HCT: 24.2 % — ABNORMAL LOW (ref 39.0–52.0)
Hemoglobin: 7.1 g/dL — CL (ref 13.0–17.0)
Lymphocytes Relative: 16.2 % (ref 12.0–46.0)
Lymphs Abs: 1.6 10*3/uL (ref 0.7–4.0)
MCHC: 29.5 g/dL — ABNORMAL LOW (ref 30.0–36.0)
MCV: 70.2 fl — ABNORMAL LOW (ref 78.0–100.0)
Monocytes Absolute: 1 10*3/uL (ref 0.1–1.0)
Monocytes Relative: 10.4 % (ref 3.0–12.0)
Neutro Abs: 7.1 10*3/uL (ref 1.4–7.7)
Neutrophils Relative %: 71.3 % (ref 43.0–77.0)
Platelets: 336 10*3/uL (ref 150.0–400.0)
RBC: 3.44 Mil/uL — ABNORMAL LOW (ref 4.22–5.81)
RDW: 21.9 % — ABNORMAL HIGH (ref 11.5–15.5)
WBC: 10 10*3/uL (ref 4.0–10.5)

## 2022-02-19 LAB — FERRITIN: Ferritin: 4.4 ng/mL — ABNORMAL LOW (ref 22.0–322.0)

## 2022-02-19 MED ORDER — FLUOXETINE HCL 40 MG PO CAPS: 40.0000 mg | ORAL_CAPSULE | Freq: Every day | ORAL | 6 refills | Status: DC

## 2022-02-19 NOTE — Telephone Encounter (Signed)
CRITICAL VALUE STICKER  CRITICAL VALUE: Hemoglobin 7.1   RECEIVER (on-site recipient of call): [name]   DATE & TIME NOTIFIED: 02/19/2022  MESSENGER (representative from lab):Carolin   MD NOTIFIED: Dr. Darnell Level   TIME OF NOTIFICATION: 4:59  RESPONSE: Message sent to Dr. Danise Mina for review

## 2022-02-19 NOTE — Patient Instructions (Addendum)
Labs today  [facility] in [place] to ask about sleep study and lung function study: [phone]  We will refer you back to eye doctor in [facility]).  Stop citalopram, start prozac '40mg'$  daily. New dose sent to pharmacy.  I will write prescription for Inogen oxygen concentrator. Take it to home health for help in getting it set up.  Stop sea moss, super beet, turmeric.  Return in 6 weeks for follow up visit

## 2022-02-19 NOTE — Progress Notes (Unsigned)
Patient ID: [name], male    DOB: 28-Feb-1956, 66 y.o.   MRN: [id number]  This visit was conducted in person.  BP 130/64   Pulse 66   Temp (!) 97.4 F (36.3 C) (Temporal)   Ht _0  (1.803 m)   Wt 262 lb 8 oz (119.1 kg)   SpO2 94%   BMI 36.61 kg/m    CC: dyspnea Subjective:   HPI: [name] is a 66 y.o. male presenting on 02/19/2022 for Shortness of Breath (C/o ongoing SOB. )   Recent cardiology [name]) note from 02/15/2022 reviewed along with med changes including amiodarone 240m once daily. Continues carvedilol 6.213mBID, farxiga 88m103maily, lasix 63m32mice daily, and metolazone 2.88mg 23mce weekly. He takes potassium 10mEq30mly.   He sees advanced CHF clinic as well, last seen 01/15/2022 for combined CHF with chronic hypoxic and hypercapneic respiratory failure.   For COPD he is on breztri 2 puffs twice daily. Also has duonebs and albuterol inhaler. Saw pulm Dr DgayliGenia Harold2023 - planned PFTs and split night study to confirm suspected OHS, started on BiPAP.   Markedly high INR to 8 two wks ago, latest INR down to 1.8. current regimen is coumadin 2.88mg da26m except none on Wed and Sundays. Initially endorsed dark stools for 1 day, none since.   Notes worsening blurry vision, ongoing mental fogginess and unsteadiness.  Cbgs running 100-200s, averaging 146.  He continues glimepiride 4mg dai14mand metformin 1000mg bid78m is now on farxiga 88mg daily188m He is using supplemental oxygen at 2L regularly at rest as well as at night.   Notes worsening anxiety related to shortness of breath and at times trouble catching his breath.  Continues occ MJ use.      Relevant past medical, surgical, family and social history reviewed and updated as indicated. Interim medical history since our last visit reviewed. Allergies and medications reviewed and updated. Outpatient Medications Prior to Visit  Medication Sig Dispense Refill   ACCU-CHEK FASTCLIX LANCETS MISC  Check blood sugar once daily and as instructed. Dx 250.00 100 each 3   albuterol (VENTOLIN HFA) 108 (90 Base) MCG/ACT inhaler Inhale 2 puffs into the lungs every 6 (six) hours as needed for wheezing or shortness of breath. 8.5 each 6   amiodarone (PACERONE) 200 MG tablet Take 1 tablet (200 mg total) by mouth daily. 90 tablet 3   Blood Glucose Monitoring Suppl (BLOOD GLUCOSE MONITOR SYSTEM) W/DEVICE KIT by Does not apply route. Use to check sugar once daily and as needed Dx: E11.9 **ONE TOUCH VERIO**     Budeson-Glycopyrrol-Formoterol (BREZTRI AEROSPHERE) 160-9-4.8 MCG/ACT AERO Inhale 2 puffs into the lungs 2 (two) times daily. 32.1 g 3   Cholecalciferol (VITAMIN D) 50 MCG (2000 UT) CAPS Take 1 capsule (2,000 Units total) by mouth daily. 30 capsule    dapagliflozin propanediol (FARXIGA) 5 MG TABS tablet Take 1 tablet (5 mg total) by mouth daily before breakfast. 30 tablet 6   Docusate Calcium (STOOL SOFTENER PO) Take 1 tablet by mouth daily.     fluticasone (FLONASE) 50 MCG/ACT nasal spray Place 2 sprays into both nostrils daily. 48 mL 3   furosemide (LASIX) 80 MG tablet Take 80 mg by mouth 2 (two) times daily.     glimepiride (AMARYL) 4 MG tablet Take 1 tablet (4 mg total) by mouth daily with breakfast. 90 tablet 1   glucose blood (ONETOUCH VERIO) test strip Check blood sugar 3 times a day  300 strip 3   ipratropium-albuterol (DUONEB) 0.5-2.5 (3) MG/3ML SOLN Take 3 mLs by nebulization every 6 (six) hours as needed (severe shortness of breath/wheezing). 360 mL 0   metFORMIN (GLUCOPHAGE) 1000 MG tablet Take 1 tablet (1,000 mg total) by mouth 2 (two) times daily with a meal. 180 tablet 1   metolazone (ZAROXOLYN) 2.5 MG tablet Take 1 tablet (2.5 mg total) by mouth 2 (two) times a week. (Patient taking differently: Take 2.5 mg by mouth 2 (two) times a week. Saturday and wednesday) 24 tablet 1   montelukast (SINGULAIR) 10 MG tablet TAKE 1 TABLET BY MOUTH EVERYDAY AT BEDTIME 90 tablet 3   pantoprazole  (PROTONIX) 40 MG tablet TAKE 1 TABLET (40 MG TOTAL) BY MOUTH TWICE A DAY BEFORE MEALS 180 tablet 0   potassium chloride SA (KLOR-CON M) 10 MEQ tablet Take 1 tablet (10 mEq total) by mouth daily. 90 tablet 1   Probiotic Product (PROBIOTIC DAILY PO) Take 1 tablet by mouth daily.     simvastatin (ZOCOR) 20 MG tablet TAKE 1 TABLET BY MOUTH EVERY DAY IN THE EVENING 90 tablet 0   vitamin B-12 (CYANOCOBALAMIN) 1000 MCG tablet Take 1 tablet (1,000 mcg total) by mouth every Monday, Wednesday, and Friday. (Patient taking differently: Take 1,000 mcg by mouth daily.)     warfarin (COUMADIN) 5 MG tablet TAKE 1/2 TABLET BY MOUTH DAILY EXCEPT TAKE NOTHING ON WEDNESDAYS OR AS DIRECTED BY ANTICOAGULATION CLINIC 60 tablet 1   citalopram (CELEXA) 10 MG tablet Take 1 tablet (10 mg total) by mouth daily. 90 tablet 3   hydrOXYzine (ATARAX) 50 MG tablet TAKE 1 TABLET (50 MG TOTAL) BY MOUTH 2 (TWO) TIMES DAILY AS NEEDED FOR ANXIETY. 180 tablet 1   NON FORMULARY 1,200 mg daily. SEA MOSS     NON FORMULARY daily. SUPER BEETS     NON FORMULARY 500 mg once. TURMERIC     carvedilol (COREG) 6.25 MG tablet TAKE 1 TABLET BY MOUTH TWICE A DAY WITH FOOD 180 tablet 0   carvedilol (COREG) 3.125 MG tablet Take 1 tablet (3.125 mg total) by mouth 2 (two) times daily with a meal. 180 tablet 1   ipratropium (ATROVENT HFA) 17 MCG/ACT inhaler Inhale 2 puffs into the lungs every 6 (six) hours as needed for wheezing. (Patient not taking: Reported on 02/19/2022) 1 each 12   No facility-administered medications prior to visit.     Per HPI unless specifically indicated in ROS section below Review of Systems  Objective:  BP 130/64   Pulse 66   Temp (!) 97.4 F (36.3 C) (Temporal)   Ht _0  (1.803 m)   Wt 262 lb 8 oz (119.1 kg)   SpO2 94%   BMI 36.61 kg/m   Wt Readings from Last 3 Encounters:  02/19/22 262 lb 8 oz (119.1 kg)  01/18/22 265 lb (120.2 kg)  01/15/22 274 lb 6 oz (124.5 kg)      Physical Exam Vitals and nursing note  reviewed.  Constitutional:      Appearance: Normal appearance. He is obese. He is ill-appearing.     Comments: Disheveled   Musculoskeletal:        General: Swelling (nonpitting) present.     Right lower leg: No edema.     Left lower leg: No edema.  Neurological:     Mental Status: He is alert.       Results for orders placed or performed in visit on 82/50/53  Basic metabolic panel  Result Value  Ref Range   Sodium 141 135 - 145 mEq/L   Potassium 4.2 3.5 - 5.1 mEq/L   Chloride 93 (L) 96 - 112 mEq/L   CO2 41 (H) 19 - 32 mEq/L   Glucose, Bld 129 (H) 70 - 99 mg/dL   BUN 16 6 - 23 mg/dL   Creatinine, Ser 0.89 0.40 - 1.50 mg/dL   GFR 89.35 >60.00 mL/min   Calcium 9.8 8.4 - 10.5 mg/dL  Ferritin  Result Value Ref Range   Ferritin 4.4 (L) 22.0 - 322.0 ng/mL  IBC panel  Result Value Ref Range   Iron 16 (L) 42 - 165 ug/dL   Transferrin 323.0 212.0 - 360.0 mg/dL   Saturation Ratios 3.5 (L) 20.0 - 50.0 %   TIBC 452.2 (H) 250.0 - 450.0 mcg/dL  CBC with Differential/Platelet  Result Value Ref Range   WBC 10.0 4.0 - 10.5 K/uL   RBC 3.44 (L) 4.22 - 5.81 Mil/uL   Hemoglobin 7.1 Repeated and verified X2. (LL) 13.0 - 17.0 g/dL   HCT 24.2 (L) 39.0 - 52.0 %   MCV 70.2 (L) 78.0 - 100.0 fl   MCHC 29.5 (L) 30.0 - 36.0 g/dL   RDW 21.9 (H) 11.5 - 15.5 %   Platelets 336.0 150.0 - 400.0 K/uL   Neutrophils Relative % 71.3 43.0 - 77.0 %   Lymphocytes Relative 16.2 12.0 - 46.0 %   Monocytes Relative 10.4 3.0 - 12.0 %   Eosinophils Relative 1.3 0.0 - 5.0 %   Basophils Relative 0.8 0.0 - 3.0 %   Neutro Abs 7.1 1.4 - 7.7 K/uL   Lymphs Abs 1.6 0.7 - 4.0 K/uL   Monocytes Absolute 1.0 0.1 - 1.0 K/uL   Eosinophils Absolute 0.1 0.0 - 0.7 K/uL   Basophils Absolute 0.1 0.0 - 0.1 K/uL   Lab Results  Component Value Date   INR 1.8 (A) 02/15/2022   INR 2.0 02/08/2022   INR 8.0 (A) 02/04/2022    Lab Results  Component Value Date   HGBA1C 8.6 (H) 01/03/2022    Assessment & Plan:   Problem List  Items Addressed This Visit     Obstructive sleep apnea    Severe OSA, presumed OHVS. Saw pulm last month, planned PFTs and sleep study, has not been contacted to get this scheduled. # provided to pulm to inquire about upcoming appointments.        Severe obesity (BMI 35.0-39.9) with comorbidity (HCC)    Significant diuresis of 30 lbs on latest hospitalization early 12/2021, ~50 lbs down since then.  Continue current regimen.  Obesity complicated by HTN, HLD, DM, OSA, CAD and CHF.       Hypertension    meds clarified - he should only be on carvedilol 6.22m bid      Panic attacks    Worsening. Hydroxyzine ineffective. He's been on celexa - given worsening anxitey with anxiety attacks and current amiodarone use, will change to prozac 472mdaily. Discussed hesitance to use controlled substances in ongoing intermittent MJ use.       Relevant Medications   FLUoxetine (PROZAC) 40 MG capsule   Type 2 diabetes mellitus with other specified complication (HCHohen[place]   Now on daily farxiga in addition to his glimepiride and metformin. Too soon to check A1c.       Relevant Orders   Ambulatory referral to Ophthalmology   Exertional dyspnea - Primary    Ongoing - anticipate untreated severe OSA contributing as well as known R heart  failure and HFpEF, has also established with CHF clinic. Update anemia levels.       Relevant Orders   Basic metabolic panel (Completed)   Ferritin (Completed)   IBC panel (Completed)   CBC with Differential/Platelet (Completed)   Chronic diastolic heart failure (HCC)    Continue lasix 66m BID with metolazone 2.521mtwice weekly, he also takes potassium 1064mdaily.       COPD (chronic obstructive pulmonary disease) (HCC)    Continue breztri 2 puffs BID.  He will call pulm to f/u about planned PFTs.       Iron deficiency anemia    Recent dark stools in setting of supratherapeutic INR to 8 a few weeks ago. Hgb at that time was 8, a drop from 10 in September. Will  recheck CBC, iron panel.   [place] [facility] - plan at that time was proceed with capsule endoscopy however appt was missed due to scheduling confusion.  Established with heme/onc Dr RaoJanese Banks2023 s/p 4 iron infusions with feraheme, latest 10/30/2021. Again plan was f/u with GI but not done.       Relevant Orders   Basic metabolic panel (Completed)   Ferritin (Completed)   IBC panel (Completed)   CBC with Differential/Platelet (Completed)   Atrial fibrillation (HCC)    On amiodarone carvedilol and coumadin (KoNehemiah [name]      Chronic respiratory failure with hypoxia and hypercapnia (HCC)    Rx written for Inogen portable oxygen concentrator.       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 [facility]       Relevant Orders   Ambulatory referral to Ophthalmology   MDD (major depressive disorder), recurrent episode, moderate (HCCGl[place]  See above- change celexa to prozac.       Relevant Medications   FLUoxetine (PROZAC) 40 MG capsule     Meds ordered this encounter  Medications   FLUoxetine (PROZAC) 40 MG capsule    Sig: Take 1 capsule (40 mg total) by mouth daily.    Dispense:  30 capsule    Refill:  6    To replace citalopram   Orders Placed This Encounter  Procedures   Basic metabolic panel   Ferritin   IBC panel   CBC with Differential/Platelet   Ambulatory referral to Ophthalmology    Referral Priority:   Routine    Referral Type:   Consultation    Referral Reason:   Specialty Services Required    Requested Specialty:   Ophthalmology    Number of Visits Requested:   1    Patient Instructions  Labs today  [facility] [place] study and lung function study: [phone]  [facility] Stop citalopram, start prozac 79m18mily. New dose sent to pharmacy.  I will write prescription for Inogen oxygen concentrator. Take it to home health for help in getting it set up.  Stop sea moss, super beet, turmeric.  Return in 6 weeks for follow up visit   Follow up plan: Return in about 6 weeks (around 04/02/2022), or if symptoms worsen or fail to improve, for follow up visit.  [name]

## 2022-02-19 NOTE — Telephone Encounter (Signed)
Called patient to discuss Hgb.  Down from 10.8 last month, in setting if supratherapeutic INR earlier in the month and dark stools. No further dark stools.  This partly explains shortness of breath he's been having. Given symptomatic recommend ER evaluation for blood transfusion. Reviewed risks of severe anemia in CAD hx. He states he will go to ER today or tomorrow am.   Lab Results  Component Value Date   INR 1.8 (A) 02/15/2022   INR 2.0 02/08/2022   INR 8.0 (A) 02/04/2022

## 2022-02-19 NOTE — Assessment & Plan Note (Signed)
Notes recently worsening blurry vision, L>R. He is overdue for eye exam - requests return to [facility] - referral placed.

## 2022-02-20 ENCOUNTER — Encounter: Payer: Self-pay | Admitting: Family Medicine

## 2022-02-20 DIAGNOSIS — I420 Dilated cardiomyopathy: Secondary | ICD-10-CM | POA: Diagnosis not present

## 2022-02-20 DIAGNOSIS — I25119 Atherosclerotic heart disease of native coronary artery with unspecified angina pectoris: Secondary | ICD-10-CM | POA: Diagnosis not present

## 2022-02-20 DIAGNOSIS — J441 Chronic obstructive pulmonary disease with (acute) exacerbation: Secondary | ICD-10-CM | POA: Diagnosis not present

## 2022-02-20 DIAGNOSIS — G43909 Migraine, unspecified, not intractable, without status migrainosus: Secondary | ICD-10-CM | POA: Diagnosis not present

## 2022-02-20 DIAGNOSIS — F331 Major depressive disorder, recurrent, moderate: Secondary | ICD-10-CM | POA: Insufficient documentation

## 2022-02-20 DIAGNOSIS — I252 Old myocardial infarction: Secondary | ICD-10-CM | POA: Diagnosis not present

## 2022-02-20 DIAGNOSIS — I110 Hypertensive heart disease with heart failure: Secondary | ICD-10-CM | POA: Diagnosis not present

## 2022-02-20 DIAGNOSIS — L97229 Non-pressure chronic ulcer of left calf with unspecified severity: Secondary | ICD-10-CM | POA: Diagnosis not present

## 2022-02-20 DIAGNOSIS — M1990 Unspecified osteoarthritis, unspecified site: Secondary | ICD-10-CM | POA: Diagnosis not present

## 2022-02-20 DIAGNOSIS — Z5181 Encounter for therapeutic drug level monitoring: Secondary | ICD-10-CM | POA: Diagnosis not present

## 2022-02-20 DIAGNOSIS — I4891 Unspecified atrial fibrillation: Secondary | ICD-10-CM | POA: Diagnosis not present

## 2022-02-20 DIAGNOSIS — I5022 Chronic systolic (congestive) heart failure: Secondary | ICD-10-CM | POA: Diagnosis not present

## 2022-02-20 DIAGNOSIS — E876 Hypokalemia: Secondary | ICD-10-CM | POA: Diagnosis not present

## 2022-02-20 DIAGNOSIS — L03115 Cellulitis of right lower limb: Secondary | ICD-10-CM | POA: Diagnosis not present

## 2022-02-20 DIAGNOSIS — E785 Hyperlipidemia, unspecified: Secondary | ICD-10-CM | POA: Diagnosis not present

## 2022-02-20 DIAGNOSIS — I872 Venous insufficiency (chronic) (peripheral): Secondary | ICD-10-CM | POA: Diagnosis not present

## 2022-02-20 DIAGNOSIS — Z9181 History of falling: Secondary | ICD-10-CM | POA: Diagnosis not present

## 2022-02-20 DIAGNOSIS — G4733 Obstructive sleep apnea (adult) (pediatric): Secondary | ICD-10-CM | POA: Diagnosis not present

## 2022-02-20 DIAGNOSIS — I5033 Acute on chronic diastolic (congestive) heart failure: Secondary | ICD-10-CM | POA: Diagnosis not present

## 2022-02-20 DIAGNOSIS — E1165 Type 2 diabetes mellitus with hyperglycemia: Secondary | ICD-10-CM | POA: Diagnosis not present

## 2022-02-20 DIAGNOSIS — F32A Depression, unspecified: Secondary | ICD-10-CM | POA: Diagnosis not present

## 2022-02-20 DIAGNOSIS — L97829 Non-pressure chronic ulcer of other part of left lower leg with unspecified severity: Secondary | ICD-10-CM | POA: Diagnosis not present

## 2022-02-20 DIAGNOSIS — L03116 Cellulitis of left lower limb: Secondary | ICD-10-CM | POA: Diagnosis not present

## 2022-02-20 DIAGNOSIS — K219 Gastro-esophageal reflux disease without esophagitis: Secondary | ICD-10-CM | POA: Diagnosis not present

## 2022-02-20 NOTE — Assessment & Plan Note (Signed)
Significant diuresis of 30 lbs on latest hospitalization early 12/2021, ~50 lbs down since then.  Continue current regimen.  Obesity complicated by HTN, HLD, DM, OSA, CAD and CHF.

## 2022-02-20 NOTE — Assessment & Plan Note (Addendum)
Worsening. Hydroxyzine ineffective. He's been on celexa - given worsening anxitey with anxiety attacks and current amiodarone use, will change to prozac '40mg'$  daily. Discussed hesitance to use controlled substances in ongoing intermittent MJ use.

## 2022-02-20 NOTE — Assessment & Plan Note (Signed)
See above- change celexa to prozac.

## 2022-02-20 NOTE — Assessment & Plan Note (Addendum)
Recent dark stools in setting of supratherapeutic INR to 8 a few weeks ago. Hgb at that time was 8, a drop from 10 in September. Will recheck CBC, iron panel.   S/p EGD/colonoscopy 04/2020 [name]) showing descending colon ulcer - plan at that time was proceed with capsule endoscopy however appt was missed due to scheduling confusion.  Established with heme/onc Dr [name] 09/2021 s/p 4 iron infusions with feraheme, latest 10/30/2021. Again plan was f/u with GI but not done.

## 2022-02-20 NOTE — Assessment & Plan Note (Signed)
meds clarified - he should only be on carvedilol 6.'25mg'$  bid

## 2022-02-20 NOTE — Assessment & Plan Note (Signed)
Continue breztri 2 puffs BID.  He will call pulm to f/u about planned PFTs.

## 2022-02-20 NOTE — Assessment & Plan Note (Signed)
On amiodarone carvedilol and coumadin [name]).

## 2022-02-20 NOTE — Assessment & Plan Note (Signed)
Severe OSA, presumed OHVS. Saw pulm last month, planned PFTs and sleep study, has not been contacted to get this scheduled. # provided to pulm to inquire about upcoming appointments.

## 2022-02-20 NOTE — Assessment & Plan Note (Signed)
Continue lasix '80mg'$  BID with metolazone 2.'5mg'$  twice weekly, he also takes potassium 17mq daily.

## 2022-02-20 NOTE — Assessment & Plan Note (Signed)
Rx written for Inogen portable oxygen concentrator.

## 2022-02-20 NOTE — Assessment & Plan Note (Addendum)
Now on daily farxiga in addition to his glimepiride and metformin. Too soon to check A1c.

## 2022-02-20 NOTE — Assessment & Plan Note (Signed)
Ongoing - anticipate untreated severe OSA contributing as well as known R heart failure and HFpEF, has also established with CHF clinic. Update anemia levels.

## 2022-02-22 ENCOUNTER — Emergency Department: Payer: Medicare Other

## 2022-02-22 DIAGNOSIS — I110 Hypertensive heart disease with heart failure: Secondary | ICD-10-CM | POA: Diagnosis not present

## 2022-02-22 DIAGNOSIS — Z7984 Long term (current) use of oral hypoglycemic drugs: Secondary | ICD-10-CM | POA: Diagnosis not present

## 2022-02-22 DIAGNOSIS — E119 Type 2 diabetes mellitus without complications: Secondary | ICD-10-CM | POA: Insufficient documentation

## 2022-02-22 DIAGNOSIS — D649 Anemia, unspecified: Secondary | ICD-10-CM | POA: Diagnosis not present

## 2022-02-22 DIAGNOSIS — Z87891 Personal history of nicotine dependence: Secondary | ICD-10-CM | POA: Insufficient documentation

## 2022-02-22 DIAGNOSIS — I5022 Chronic systolic (congestive) heart failure: Secondary | ICD-10-CM | POA: Diagnosis not present

## 2022-02-22 DIAGNOSIS — Z7901 Long term (current) use of anticoagulants: Secondary | ICD-10-CM | POA: Diagnosis not present

## 2022-02-22 DIAGNOSIS — Z1152 Encounter for screening for COVID-19: Secondary | ICD-10-CM | POA: Insufficient documentation

## 2022-02-22 DIAGNOSIS — Z79899 Other long term (current) drug therapy: Secondary | ICD-10-CM | POA: Insufficient documentation

## 2022-02-22 DIAGNOSIS — J449 Chronic obstructive pulmonary disease, unspecified: Secondary | ICD-10-CM | POA: Diagnosis not present

## 2022-02-22 DIAGNOSIS — I4891 Unspecified atrial fibrillation: Secondary | ICD-10-CM | POA: Insufficient documentation

## 2022-02-22 DIAGNOSIS — R0602 Shortness of breath: Secondary | ICD-10-CM | POA: Diagnosis not present

## 2022-02-22 DIAGNOSIS — Z743 Need for continuous supervision: Secondary | ICD-10-CM | POA: Diagnosis not present

## 2022-02-22 DIAGNOSIS — R0902 Hypoxemia: Secondary | ICD-10-CM | POA: Diagnosis not present

## 2022-02-22 DIAGNOSIS — I2510 Atherosclerotic heart disease of native coronary artery without angina pectoris: Secondary | ICD-10-CM | POA: Insufficient documentation

## 2022-02-22 DIAGNOSIS — J9811 Atelectasis: Secondary | ICD-10-CM | POA: Diagnosis not present

## 2022-02-22 LAB — CBC WITH DIFFERENTIAL/PLATELET
Abs Immature Granulocytes: 0.04 10*3/uL (ref 0.00–0.07)
Basophils Absolute: 0 10*3/uL (ref 0.0–0.1)
Basophils Relative: 0 %
Eosinophils Absolute: 0.1 10*3/uL (ref 0.0–0.5)
Eosinophils Relative: 1 %
HCT: 24.6 % — ABNORMAL LOW (ref 39.0–52.0)
Hemoglobin: 6.7 g/dL — ABNORMAL LOW (ref 13.0–17.0)
Immature Granulocytes: 0 %
Lymphocytes Relative: 16 %
Lymphs Abs: 1.5 10*3/uL (ref 0.7–4.0)
MCH: 19.9 pg — ABNORMAL LOW (ref 26.0–34.0)
MCHC: 27.2 g/dL — ABNORMAL LOW (ref 30.0–36.0)
MCV: 73.2 fL — ABNORMAL LOW (ref 80.0–100.0)
Monocytes Absolute: 1 10*3/uL (ref 0.1–1.0)
Monocytes Relative: 10 %
Neutro Abs: 6.9 10*3/uL (ref 1.7–7.7)
Neutrophils Relative %: 73 %
Platelets: 268 10*3/uL (ref 150–400)
RBC: 3.36 MIL/uL — ABNORMAL LOW (ref 4.22–5.81)
RDW: 19.6 % — ABNORMAL HIGH (ref 11.5–15.5)
WBC: 9.6 10*3/uL (ref 4.0–10.5)
nRBC: 0.2 % (ref 0.0–0.2)

## 2022-02-22 LAB — COMPREHENSIVE METABOLIC PANEL
ALT: 17 U/L (ref 0–44)
AST: 26 U/L (ref 15–41)
Albumin: 3.7 g/dL (ref 3.5–5.0)
Alkaline Phosphatase: 54 U/L (ref 38–126)
Anion gap: 9 (ref 5–15)
BUN: 15 mg/dL (ref 8–23)
CO2: 35 mmol/L — ABNORMAL HIGH (ref 22–32)
Calcium: 9 mg/dL (ref 8.9–10.3)
Chloride: 96 mmol/L — ABNORMAL LOW (ref 98–111)
Creatinine, Ser: 0.72 mg/dL (ref 0.61–1.24)
GFR, Estimated: >60 mL/min (ref >60)
Glucose, Bld: 110 mg/dL — ABNORMAL HIGH (ref 70–99)
Potassium: 3.4 mmol/L — ABNORMAL LOW (ref 3.5–5.1)
Sodium: 140 mmol/L (ref 135–145)
Total Bilirubin: 0.7 mg/dL (ref 0.3–1.2)
Total Protein: 6.9 g/dL (ref 6.5–8.1)

## 2022-02-22 LAB — RESP PANEL BY RT-PCR (FLU A&B, COVID) ARPGX2
Influenza A by PCR: NEGATIVE
Influenza B by PCR: NEGATIVE
SARS Coronavirus 2 by RT PCR: NEGATIVE

## 2022-02-22 LAB — TROPONIN I (HIGH SENSITIVITY): Troponin I (High Sensitivity): 13 ng/L (ref <18)

## 2022-02-22 LAB — BRAIN NATRIURETIC PEPTIDE: B Natriuretic Peptide: 130.6 pg/mL — ABNORMAL HIGH (ref 0.0–100.0)

## 2022-02-22 LAB — PROTIME-INR
INR: 1.4 — ABNORMAL HIGH (ref 0.8–1.2)
Prothrombin Time: 16.9 seconds — ABNORMAL HIGH (ref 11.4–15.2)

## 2022-02-22 NOTE — Telephone Encounter (Signed)
I cannot order blood transfusions or iron infusions through [facility]. He would have to return to hematologist Dr Janese Banks.  Is he willing to go to [facility] for transfusion?  Again, safest and quickest option is evaluation through the ER and that is still my recommendation.

## 2022-02-22 NOTE — Telephone Encounter (Signed)
Patient called and stated he is not gonna sit in the ER and get tested for the same things. Would like a call back about the situation. Call back number [phone].

## 2022-02-22 NOTE — ED Notes (Signed)
Pt sitting in lobby with no distress noted; current O2 sat 85% on ra; pt reports normally on O2 at 2l/min via [place] at all times; O2 placed as instructed and sats now 95%

## 2022-02-22 NOTE — Telephone Encounter (Signed)
Patient called back. He states that he did not go to ED because he did not want to go and sit around. He called the ED and they wanted to do evaluation and could not just come in and get blood. Advised patient that yes they would need to do evaluation in order to treat but would be able to see test and notes from Dr. Darnell Level as long as it was in [facility] system. He states he does not want to go to ED can we set him up for transfusion at the same place that they do iron infusions? Advised patient that I would send message to have reviewed by Dr. Darnell Level. We will call him with his recommendation.   Sending message high priority to Dr. Darnell Level and his [place]

## 2022-02-22 NOTE — ED Triage Notes (Signed)
First nurse note: Patient arrived by [facility] from home with c/o intermitent SOB. EMS reports talking in full sentences with no issues.   Told by MD Friday his iron levels were low.   EMS vitals: 130/80b/p 84P 93% Ra

## 2022-02-22 NOTE — ED Provider Triage Note (Signed)
Emergency Medicine Provider Triage Evaluation Note  [name] , a 66 y.o. male  was evaluated in triage.  Pt complains of intermittent shortness of breath, anemia.  Patient presents with concern for his blood counts which are 7.1 on his hemoglobin.  Patient states that he was called and told to come over for transfusion but had gotten food poisoning and waited for 2 additional days.  Patient is also having some intermittent shortness of breath but no chest pain.  History of sleep apnea and COPD.Marland Kitchen  Review of Systems  Positive: Intermittent shortness of breath, anemia, weakness Negative: Fever, chills, cough, chest pain, GI symptoms  Physical Exam  BP 131/78 (BP Location: Right Arm)   Pulse 76   Temp 98.6 F (37 C) (Oral)   Resp 18   Wt 118.8 kg   SpO2 92%   BMI 36.54 kg/m  Gen:   Awake, no distress   Resp:  Normal effort  MSK:   Moves extremities without difficulty  Other:    Medical Decision Making  Medically screening exam initiated at 4:15 PM.  Appropriate orders placed.  [name] was informed that the remainder of the evaluation will be completed by another provider, this initial triage assessment does not replace that evaluation, and the importance of remaining in the ED until their evaluation is complete.  Patient presents with shortness of breath, weakness and a hemoglobin from primary care 7.9.  Patient will have labs, chest x-ray, EKG   Darletta Moll, PA-C 02/22/22 1617

## 2022-02-22 NOTE — Telephone Encounter (Signed)
Spoke with pt relaying Dr. Synthia Innocent message.  Pt verbalizes understanding and states he will go to [facility] ED.  Fyi to Dr. Darnell Level.

## 2022-02-22 NOTE — Telephone Encounter (Signed)
error 

## 2022-02-22 NOTE — ED Triage Notes (Signed)
Pt sts that he was at his PCP on Friday and labs were taken. Pt sts that his pcp called him on Friday night and told him that he needed to get a transfusion and was told to come and get checked out.

## 2022-02-23 ENCOUNTER — Observation Stay
Admission: EM | Admit: 2022-02-23 | Discharge: 2022-02-23 | Disposition: A | Discharge status: Home / Self Care | Payer: Medicare Other | Attending: Internal Medicine | Admitting: Internal Medicine

## 2022-02-23 DIAGNOSIS — D649 Anemia, unspecified: Secondary | ICD-10-CM | POA: Diagnosis present

## 2022-02-23 LAB — CBC WITH DIFFERENTIAL/PLATELET
Abs Immature Granulocytes: 0.04 10*3/uL (ref 0.00–0.07)
Basophils Absolute: 0.1 10*3/uL (ref 0.0–0.1)
Basophils Relative: 1 %
Eosinophils Absolute: 0.1 10*3/uL (ref 0.0–0.5)
Eosinophils Relative: 1 %
HCT: 27.7 % — ABNORMAL LOW (ref 39.0–52.0)
Hemoglobin: 7.6 g/dL — ABNORMAL LOW (ref 13.0–17.0)
Immature Granulocytes: 0 %
Lymphocytes Relative: 15 %
Lymphs Abs: 1.4 10*3/uL (ref 0.7–4.0)
MCH: 20.8 pg — ABNORMAL LOW (ref 26.0–34.0)
MCHC: 27.4 g/dL — ABNORMAL LOW (ref 30.0–36.0)
MCV: 75.7 fL — ABNORMAL LOW (ref 80.0–100.0)
Monocytes Absolute: 1 10*3/uL (ref 0.1–1.0)
Monocytes Relative: 11 %
Neutro Abs: 6.9 10*3/uL (ref 1.7–7.7)
Neutrophils Relative %: 72 %
Platelets: 245 10*3/uL (ref 150–400)
RBC: 3.66 MIL/uL — ABNORMAL LOW (ref 4.22–5.81)
RDW: 20 % — ABNORMAL HIGH (ref 11.5–15.5)
WBC: 9.5 10*3/uL (ref 4.0–10.5)
nRBC: 0 % (ref 0.0–0.2)

## 2022-02-23 LAB — IRON AND TIBC
Iron: 14 ug/dL — ABNORMAL LOW (ref 45–182)
Saturation Ratios: 3 % — ABNORMAL LOW (ref 17.9–39.5)
TIBC: 435 ug/dL (ref 250–450)
UIBC: 421 ug/dL

## 2022-02-23 LAB — HEMOGLOBIN AND HEMATOCRIT, BLOOD
HCT: 25.3 % — ABNORMAL LOW (ref 39.0–52.0)
Hemoglobin: 6.8 g/dL — ABNORMAL LOW (ref 13.0–17.0)

## 2022-02-23 LAB — RETICULOCYTES
Immature Retic Fract: 44.5 % — ABNORMAL HIGH (ref 2.3–15.9)
RBC.: 3.58 MIL/uL — ABNORMAL LOW (ref 4.22–5.81)
Retic Count, Absolute: 75.9 10*3/uL (ref 19.0–186.0)
Retic Ct Pct: 2.1 % (ref 0.4–3.1)

## 2022-02-23 LAB — VITAMIN B12: Vitamin B-12: 699 pg/mL (ref 180–914)

## 2022-02-23 LAB — FOLATE: Folate: 13.9 ng/mL (ref >5.9)

## 2022-02-23 LAB — PREPARE RBC (CROSSMATCH)

## 2022-02-23 LAB — TROPONIN I (HIGH SENSITIVITY): Troponin I (High Sensitivity): 14 ng/L (ref <18)

## 2022-02-23 LAB — FERRITIN: Ferritin: 6 ng/mL — ABNORMAL LOW (ref 24–336)

## 2022-02-23 MED ORDER — SODIUM CHLORIDE 0.9 % IV SOLN
10.0000 mL/h | Freq: Once | INTRAVENOUS | Status: AC
  Administered 2022-02-23: 10 mL/h via INTRAVENOUS

## 2022-02-23 MED ORDER — SODIUM CHLORIDE 0.9 % IV SOLN: 10.0000 mL/h | Freq: Once | INTRAVENOUS | Status: DC

## 2022-02-23 NOTE — ED Notes (Signed)
ED Provider at bedside. 

## 2022-02-23 NOTE — ED Provider Notes (Signed)
[name] [name] Provider Note    Event Date/Time   First MD Initiated Contact with Patient 02/23/22 [phone]     (approximate)   History   Shortness of Breath   HPI  [name] [name] is a 66 y.o. male with COPD on chronic [name], [name] [name], [name] [name], [name] [name], [name] [name], [name] [name] told by his doctor on Friday that he [name] anemic and needed to come to the [name] for blood transfusion.  States he has been on iron tablets before but recently stopped them as they were causing constipation.  Denies bloody stools, melena.  He is on Coumadin for history of a flutter.   History provided by patient.    Past Medical History:  Diagnosis Date   Abnormal drug screen 2015   MJ positive x3 - one more and we will stop prescribing ativan (08/2013).   [name]    Angina    Anxiety    Arrhythmia    atrial fibrillation   Arthritis    [name] [name] (coronary artery disease)    nonobstructive   Cannabis abuse    Chronic systolic CHF (congestive heart failure) ([place]) 2013   NYHA Class II/III   COPD (chronic obstructive pulmonary disease) ([place])    [name] [name] type 2, uncontrolled    declines DSME   Dilated cardiomyopathy ([place]) 2013   now improved   Ex-smoker    Frequent headaches    History of atrial fibrillation 2013   chronic, s/p ablation prior on coumadin   [name] [name]    [name] [name]    Migraine    Narcolepsy    Nonischemic cardiomyopathy ([place]) 2013   EF 25% per Dr Nehemiah Massed   Obesity    OSA (obstructive sleep apnea)    does not use CPAP - unable to tolerate   Seasonal allergies     Past Surgical History:  Procedure Laterality Date   ATRIAL FLUTTER ABLATION N/A 09/21/2011   Procedure: ATRIAL FLUTTER ABLATION;  Surgeon: Thompson Grayer, MD;  Location: [facility] CATH LAB;  Service: Cardiovascular;  Laterality: N/A;   CARDIAC ELECTROPHYSIOLOGY MAPPING AND ABLATION   2013   for atrial flutter   CARDIOVASCULAR STRESS TEST  03/2012   ETT WNL Nehemiah Massed)   CARDIOVERSION N/A 08/12/2021   Procedure: CARDIOVERSION;  Surgeon: Corey Skains, MD;  Location: [facility] ORS;  Service: Cardiovascular;  Laterality: N/A;   COLONOSCOPY WITH PROPOFOL N/A 05/22/2020   TA, diverticulosis, descending colon ulcer biopsy WNL, int hem Allen Norris, Darren, MD)   ESOPHAGOGASTRODUODENOSCOPY (EGD) WITH PROPOFOL N/A 05/22/2020   WNL ([place])   US ECHOCARDIOGRAPHY  2014   EF 50%, nl LV fxn, RV nl size/function, mild mitral insuff    MEDICATIONS:  Prior to Admission medications   Medication Sig Start Date End Date Taking? Authorizing Provider  ACCU-CHEK FASTCLIX LANCETS MISC Check blood sugar once daily and as instructed. Dx 250.00 05/10/13   [name], MD  albuterol (VENTOLIN HFA) 108 (90 Base) MCG/ACT inhaler Inhale 2 puffs into the lungs every 6 (six) hours as needed for wheezing or shortness of breath. 01/12/22   [name], MD  amiodarone (PACERONE) 200 MG tablet Take 1 tablet (200 mg total) by mouth daily. 09/16/21   Vickie Epley, MD  Blood Glucose Monitoring Suppl (BLOOD GLUCOSE MONITOR SYSTEM) W/DEVICE KIT by Does not apply route. Use to check sugar once daily and as needed Dx: E11.9 **ONE TOUCH VERIO**    [provider]  Budeson-Glycopyrrol-Formoterol (BREZTRI AEROSPHERE) 160-9-4.8 MCG/ACT AERO Inhale 2 puffs into the lungs 2 (two) times daily. 10/23/21   [name], MD  carvedilol (COREG) 6.25 MG tablet TAKE 1 TABLET BY MOUTH TWICE A DAY WITH FOOD 01/26/22   [name], MD  Cholecalciferol (VITAMIN D) 50 MCG (2000 UT) CAPS Take 1 capsule (2,000 Units total) by mouth daily. 02/05/19   [name], MD  dapagliflozin propanediol (FARXIGA) 5 MG TABS tablet Take 1 tablet (5 mg total) by mouth daily before breakfast. 11/30/21   [name], MD  Docusate Calcium (STOOL SOFTENER PO) Take 1 tablet by mouth daily.    [provider]   FLUoxetine (PROZAC) 40 MG capsule Take 1 capsule (40 mg total) by mouth daily. 02/19/22   [name], MD  fluticasone [facility]) 50 MCG/ACT nasal spray Place 2 sprays into both nostrils daily. 01/12/22   [name], MD  furosemide (LASIX) 80 MG tablet Take 80 mg by mouth 2 (two) times daily. 12/25/21   [provider]  glimepiride (AMARYL) 4 MG tablet Take 1 tablet (4 mg total) by mouth daily with breakfast. 01/14/22   [name], MD  glucose blood [name] VERIO) test strip Check blood sugar 3 times a day 10/22/21   [name], MD  ipratropium-albuterol (DUONEB) 0.5-2.5 (3) MG/3ML SOLN Take 3 mLs by nebulization every 6 (six) hours as needed (severe shortness of breath/wheezing). 01/05/22   Emeterio Reeve, DO  metFORMIN (GLUCOPHAGE) 1000 MG tablet Take 1 tablet (1,000 mg total) by mouth 2 (two) times daily with a meal. 01/14/22   [name], MD  metolazone (ZAROXOLYN) 2.5 MG tablet Take 1 tablet (2.5 mg total) by mouth 2 (two) times a week. Patient taking differently: Take 2.5 mg by mouth 2 (two) times a week. Saturday and wednesday 09/10/21   [name], MD  montelukast (SINGULAIR) 10 MG tablet TAKE 1 TABLET BY MOUTH EVERYDAY AT BEDTIME 07/07/21   [name], MD  pantoprazole (PROTONIX) 40 MG tablet TAKE 1 TABLET (40 MG TOTAL) BY MOUTH TWICE A DAY BEFORE MEALS 01/11/22   [name], MD  potassium chloride SA (KLOR-CON M) 10 MEQ tablet Take 1 tablet (10 mEq total) by mouth daily. 01/15/22   [name], MD  Probiotic Product (PROBIOTIC DAILY PO) Take 1 tablet by mouth daily.    [provider]  simvastatin (ZOCOR) 20 MG tablet TAKE 1 TABLET BY MOUTH EVERY DAY IN THE EVENING 12/29/21   [name], MD  vitamin B-12 (CYANOCOBALAMIN) 1000 MCG tablet Take 1 tablet (1,000 mcg total) by mouth every Monday, Wednesday, and Friday. Patient taking differently: Take 1,000 mcg by mouth daily. 07/22/21   [name], MD  warfarin  (COUMADIN) 5 MG tablet TAKE 1/2 TABLET BY MOUTH DAILY EXCEPT TAKE NOTHING ON [name] OR AS DIRECTED BY ANTICOAGULATION CLINIC 11/23/21   [name], MD    Physical Exam   Triage Vital Signs: ED Triage Vitals [02/22/22 1613]  Enc Vitals Group     BP 131/78     Pulse Rate 76     Resp 18     Temp 98.6 F (37 C)     Temp Source Oral     SpO2 92 %     Weight 262 lb (118.8 kg)     Height      Head Circumference      Peak Flow      Pain Score 0     Pain Loc      Pain Edu?  Excl. in [place]?     Most recent vital signs: Vitals:   02/23/22 0600 02/23/22 0621  BP: 130/76 133/73  Pulse: (!) 58 (!) 59  Resp:  18  Temp:  98.4 F (36.9 C)  SpO2: 99% 99%    CONSTITUTIONAL: Alert and oriented and responds appropriately to questions.  Obese, chronically ill-appearing HEAD: Normocephalic, atraumatic EYES: Conjunctivae clear, pupils appear equal, sclera nonicteric ENT: normal nose; moist mucous membranes NECK: Supple, normal ROM CARD: RRR; S1 and S2 appreciated; no murmurs, no clicks, no rubs, no gallops RESP: Normal chest excursion without splinting or tachypnea; breath sounds clear and equal bilaterally; no wheezes, no rhonchi, no rales, no hypoxia or respiratory distress, speaking full sentences ABD/GI: Normal bowel sounds; non-distended; soft, non-tender, no rebound, no guarding, no peritoneal signs RECTAL:  Normal rectal tone, no gross blood or melena, guaiac NEGATIVE, no hemorrhoids appreciated, nontender rectal exam, no fecal impaction. Chaperone present. BACK: The back appears normal EXT: Normal ROM in all joints; no deformity noted, no edema; no cyanosis SKIN: Normal color for age and race; warm; no rash on exposed skin NEURO: Moves all extremities equally, normal speech PSYCH: The patient's mood and manner are appropriate.   ED Results / Procedures / Treatments   LABS: (all labs ordered are listed, but only abnormal results are displayed) Labs Reviewed   COMPREHENSIVE METABOLIC PANEL - Abnormal; Notable for the following components:      Result Value   Potassium 3.4 (*)    Chloride 96 (*)    CO2 35 (*)    Glucose, Bld 110 (*)    All other components within normal limits  BRAIN NATRIURETIC PEPTIDE - Abnormal; Notable for the following components:   B Natriuretic Peptide 130.6 (*)    All other components within normal limits  CBC WITH DIFFERENTIAL/PLATELET - Abnormal; Notable for the following components:   RBC 3.36 (*)    Hemoglobin 6.7 (*)    HCT 24.6 (*)    MCV 73.2 (*)    MCH 19.9 (*)    MCHC 27.2 (*)    RDW 19.6 (*)    All other components within normal limits  PROTIME-INR - Abnormal; Notable for the following components:   Prothrombin Time 16.9 (*)    INR 1.4 (*)    All other components within normal limits  HEMOGLOBIN AND HEMATOCRIT, BLOOD - Abnormal; Notable for the following components:   Hemoglobin 6.8 (*)    HCT 25.3 (*)    All other components within normal limits  IRON AND TIBC - Abnormal; Notable for the following components:   Iron 14 (*)    Saturation Ratios 3 (*)    All other components within normal limits  FERRITIN - Abnormal; Notable for the following components:   Ferritin 6 (*)    All other components within normal limits  RETICULOCYTES - Abnormal; Notable for the following components:   RBC. 3.58 (*)    Immature Retic Fract 44.5 (*)    All other components within normal limits  RESP PANEL BY RT-PCR (FLU A&B, COVID) ARPGX2  FOLATE  VITAMIN B12  TYPE AND SCREEN  PREPARE RBC (CROSSMATCH)  ABO/RH  TROPONIN I (HIGH SENSITIVITY)  TROPONIN I (HIGH SENSITIVITY)     EKG:  EKG Interpretation  Date/Time:  Tuesday February 23 2022 01:21:05 EDT Ventricular Rate:  67 PR Interval:    QRS Duration: 112 QT Interval:  452 QTC Calculation: 477 R Axis:   97 Text Interpretation: Accelerated Junctional rhythm Rightward axis Abnormal  ECG When compared with ECG of 03-Jan-2022 09:52, PREVIOUS ECG IS PRESENT  Confirmed by Pryor Curia [phone]) on 02/23/2022 2:21:04 AM         RADIOLOGY: My personal review and interpretation of imaging: Chest x-ray clear.  I have personally reviewed all radiology reports.   DG Chest 2 View  Result Date: 02/22/2022 CLINICAL DATA:  Shortness of breath EXAM: CHEST - 2 VIEW COMPARISON:  01/03/2022 FINDINGS: Stable cardiomegaly. Mild right basilar atelectasis. Lungs are otherwise clear. No pleural effusion or pneumothorax. IMPRESSION: Mild right basilar atelectasis. Electronically Signed   By: Davina Poke D.O.   On: 02/22/2022 16:43     PROCEDURES:  Critical Care performed: Yes, see critical care procedure note(s)   CRITICAL CARE Performed by: Cyril Mourning Letcher Schweikert   Total critical care time: 45 minutes  Critical care time [name] exclusive of separately billable procedures and treating other patients.  Critical care [name] necessary to treat or prevent imminent or life-threatening deterioration.  Critical care [name] time spent personally by me on the following activities: development of treatment plan with patient and/or surrogate as well as nursing, discussions with consultants, evaluation of patient's response to treatment, examination of patient, obtaining history from patient or surrogate, ordering and performing treatments and interventions, ordering and review of laboratory studies, ordering and review of radiographic studies, pulse oximetry and re-evaluation of patient's condition.   Marland Kitchen1-3 Lead EKG Interpretation  Performed by: Kenitra Leventhal, Delice Bison, DO Authorized by: Samuella Rasool, Delice Bison, DO     Interpretation: normal     ECG rate:  59   ECG rate assessment: normal     Rhythm: sinus rhythm     Ectopy: none     Conduction: normal       IMPRESSION / MDM / ASSESSMENT AND PLAN / ED COURSE  I reviewed the triage vital signs and the nursing notes.    Patient here with symptomatic [name].  Sent here by his PCP.  The patient is on the cardiac monitor to evaluate  for evidence of arrhythmia and/or significant heart rate changes.   DIFFERENTIAL DIAGNOSIS (includes but not limited to):   Symptomatic [name], no signs of GI bleed, no signs of hemorrhagic shock   Patient's presentation is most consistent with acute presentation with potential threat to life or bodily function.   PLAN: Work-up initiated from triage.  Hemoglobin is 6.8.  We will add on iron panel.  No significant electrolyte derangement.  Normal renal function.  Negative troponin.  Chest x-ray reviewed and interpreted by myself and the radiologist and shows no acute abnormality.  EKG nonischemic.  Will give 1 unit packed red blood cells.  Will discuss with hospitalist for admission.   MEDICATIONS GIVEN IN ED: Medications  0.9 %  sodium chloride infusion (0 mL/hr Intravenous Stopped 02/23/22 0625)     ED COURSE:  Consulted and discussed patient's case with hospitalist, Dr. Massie Maroon.  I have recommended admission and consulting physician agrees and will place admission orders.  Patient (and family if present) agree with this plan.   I reviewed all nursing notes, vitals, pertinent previous records.  All labs, EKGs, imaging ordered have been independently reviewed and interpreted by myself.       OUTSIDE RECORDS REVIEWED: Reviewed patient's last family medicine visit with Dr. Danise Mina on 02/19/2022.       FINAL CLINICAL IMPRESSION(S) / ED DIAGNOSES   Final diagnoses:  Symptomatic [name]     Rx / DC Orders   ED Discharge Orders  None        Note:  This document [name] prepared using Dragon voice recognition software and may include unintentional dictation errors.   Jyl Chico, Delice Bison, DO 02/23/22 [phone]

## 2022-02-23 NOTE — Discharge Instructions (Signed)
With your doctor within the next several days as planned.  Return to the ER immediately for new, worsening, or persistent weakness, difficulty breathing, any abnormal bleeding or bruising, or any other new or worsening symptoms that concern you.

## 2022-02-23 NOTE — ED Provider Notes (Signed)
-----------------------------------------   9:14 AM on 02/23/2022 -----------------------------------------  I discussed the patient with Dr. Francine Graven who evaluated him for admission but stated that he did not in fact want to be admitted.  The patient had received 1 unit of PRBCs and is feeling significantly better.  I went to reassess him.  He states he is feeling fine and would like to go home today if at all possible.  His vital signs are stable and he appears well.  He agreed to a repeat CBC and the hemoglobin is now 7.6.  I recommended transfusion of the second unit before we send him home and he agrees with this plan.  However I will not readmit him.  ----------------------------------------- 1:18 PM on 02/23/2022 -----------------------------------------  The patient has received a second unit of blood.  He continues to be asymptomatic and well-appearing.  His vital signs are normal.  He is eager to be discharged home.  He has already contacted his primary care doctor for follow-up within the next few days.  Given that the patient is hemodynamically stable and that his hemoglobin was already improving after the first unit, there is no indication for a repeat CBC at this time and I think that discharge with close follow-up is reasonable.  I gave the patient strict return precautions and he expressed understanding.  I also advised that he may return at any time if he changes his mind and wishes to resume his care in the hospital.   Arta Silence, MD 02/23/22 1319

## 2022-02-23 NOTE — ED Notes (Signed)
Lab called to report that pt does not qualify for two units as hemoglobin was not <6.  Dr Leonides Schanz notified.

## 2022-02-23 NOTE — Progress Notes (Signed)
Went to see patient was a carryover from this morning to admit him to the hospital.  Reason for admission was for evaluation of anemia. Patient states that he was sent to the ER for blood transfusion which he already received and does not wish to be admitted to the hospital at this time. Discussed with ER provider who will discharge patient home.

## 2022-02-23 NOTE — ED Notes (Signed)
RBC infusion completed.

## 2022-02-23 NOTE — ED Notes (Addendum)
Pt verbalized understanding of discharge instructions. Opportunity for questions provided.    Ladona Mow called prior to discharge to transport Pt home.  Pt is self pay.

## 2022-02-24 ENCOUNTER — Ambulatory Visit: Payer: Self-pay

## 2022-02-24 ENCOUNTER — Ambulatory Visit (INDEPENDENT_AMBULATORY_CARE_PROVIDER_SITE_OTHER): Payer: Medicare Other

## 2022-02-24 ENCOUNTER — Telehealth: Payer: Self-pay | Admitting: Student in an Organized Health Care Education/Training Program

## 2022-02-24 ENCOUNTER — Telehealth: Payer: Self-pay

## 2022-02-24 DIAGNOSIS — G43909 Migraine, unspecified, not intractable, without status migrainosus: Secondary | ICD-10-CM | POA: Diagnosis not present

## 2022-02-24 DIAGNOSIS — F32A Depression, unspecified: Secondary | ICD-10-CM | POA: Diagnosis not present

## 2022-02-24 DIAGNOSIS — L97229 Non-pressure chronic ulcer of left calf with unspecified severity: Secondary | ICD-10-CM | POA: Diagnosis not present

## 2022-02-24 DIAGNOSIS — L03116 Cellulitis of left lower limb: Secondary | ICD-10-CM | POA: Diagnosis not present

## 2022-02-24 DIAGNOSIS — Z7901 Long term (current) use of anticoagulants: Secondary | ICD-10-CM

## 2022-02-24 DIAGNOSIS — L97829 Non-pressure chronic ulcer of other part of left lower leg with unspecified severity: Secondary | ICD-10-CM | POA: Diagnosis not present

## 2022-02-24 DIAGNOSIS — I5033 Acute on chronic diastolic (congestive) heart failure: Secondary | ICD-10-CM | POA: Diagnosis not present

## 2022-02-24 DIAGNOSIS — E785 Hyperlipidemia, unspecified: Secondary | ICD-10-CM | POA: Diagnosis not present

## 2022-02-24 DIAGNOSIS — I252 Old myocardial infarction: Secondary | ICD-10-CM | POA: Diagnosis not present

## 2022-02-24 DIAGNOSIS — Z5181 Encounter for therapeutic drug level monitoring: Secondary | ICD-10-CM | POA: Diagnosis not present

## 2022-02-24 DIAGNOSIS — I4891 Unspecified atrial fibrillation: Secondary | ICD-10-CM | POA: Diagnosis not present

## 2022-02-24 DIAGNOSIS — I110 Hypertensive heart disease with heart failure: Secondary | ICD-10-CM | POA: Diagnosis not present

## 2022-02-24 DIAGNOSIS — I420 Dilated cardiomyopathy: Secondary | ICD-10-CM | POA: Diagnosis not present

## 2022-02-24 DIAGNOSIS — G4733 Obstructive sleep apnea (adult) (pediatric): Secondary | ICD-10-CM | POA: Diagnosis not present

## 2022-02-24 DIAGNOSIS — E1165 Type 2 diabetes mellitus with hyperglycemia: Secondary | ICD-10-CM | POA: Diagnosis not present

## 2022-02-24 DIAGNOSIS — K219 Gastro-esophageal reflux disease without esophagitis: Secondary | ICD-10-CM | POA: Diagnosis not present

## 2022-02-24 DIAGNOSIS — L03115 Cellulitis of right lower limb: Secondary | ICD-10-CM | POA: Diagnosis not present

## 2022-02-24 DIAGNOSIS — E876 Hypokalemia: Secondary | ICD-10-CM | POA: Diagnosis not present

## 2022-02-24 DIAGNOSIS — J441 Chronic obstructive pulmonary disease with (acute) exacerbation: Secondary | ICD-10-CM | POA: Diagnosis not present

## 2022-02-24 DIAGNOSIS — I872 Venous insufficiency (chronic) (peripheral): Secondary | ICD-10-CM | POA: Diagnosis not present

## 2022-02-24 DIAGNOSIS — Z9181 History of falling: Secondary | ICD-10-CM | POA: Diagnosis not present

## 2022-02-24 DIAGNOSIS — I5022 Chronic systolic (congestive) heart failure: Secondary | ICD-10-CM | POA: Diagnosis not present

## 2022-02-24 DIAGNOSIS — I25119 Atherosclerotic heart disease of native coronary artery with unspecified angina pectoris: Secondary | ICD-10-CM | POA: Diagnosis not present

## 2022-02-24 DIAGNOSIS — M1990 Unspecified osteoarthritis, unspecified site: Secondary | ICD-10-CM | POA: Diagnosis not present

## 2022-02-24 LAB — TYPE AND SCREEN
ABO/RH(D): A POS
Antibody Screen: NEGATIVE
Unit division: 0
Unit division: 0

## 2022-02-24 LAB — BPAM RBC
Blood Product Expiration Date: 202311142359
Blood Product Expiration Date: 202311142359
ISSUE DATE / TIME: 202310310335
ISSUE DATE / TIME: 202310310934
Unit Type and Rh: 6200
Unit Type and Rh: 6200

## 2022-02-24 LAB — POCT INR: INR: 1.4 — AB (ref 2.0–3.0)

## 2022-02-24 NOTE — Progress Notes (Signed)
[name] from [facility] called with INR result of 1.4.  Pt was in ED on 02/23/22 for symptomatic anemia. Hgb was 6.7. Received 2 units of RBCs and refused admission.    Pt and [name] instructed to increase dose today to 1/2 tablet and increase dose tomorrow to 1 tablet then resume weekly dosing of 0.5 tablets (2.5 mg) once a day except take no warfarin on Sundays or Wednesdays. Recheck in 1 week.

## 2022-02-24 NOTE — Patient Outreach (Signed)
  Care Coordination   02/24/2022 Name: [name] MRN: [id number] DOB: [birth year]   Care Coordination Outreach Attempts:  An unsuccessful telephone outreach was attempted for a scheduled appointment today.  Follow Up Plan:  Additional outreach attempts will be made to offer the patient care coordination information and services.   Encounter Outcome:  No Answer  Care Coordination Interventions Activated:  No   Care Coordination Interventions:  No, not indicated    Quinn Plowman RN,BSN,CCM [place] [phone] direct line

## 2022-02-24 NOTE — Telephone Encounter (Signed)
I have checked his chart and do not see where our office has tried to contact him. I have left a message for the patient to return my call.

## 2022-02-24 NOTE — Telephone Encounter (Signed)
I appears Rodena Piety has tried to contact the patient regarding his PFT. I will route this message to her to contact the patient.

## 2022-02-24 NOTE — Progress Notes (Addendum)
Chronic Care Management Pharmacy Assistant   Name: [name]  MRN: [id number] DOB: 08-Apr-1956  Reason for Encounter: CCM (Appointment Reminder) and CCM (Hosptial Follow Up)   Medications: Outpatient Encounter Medications as of 02/24/2022  Medication Sig Note   ACCU-CHEK FASTCLIX LANCETS MISC Check blood sugar once daily and as instructed. Dx 250.00    albuterol (VENTOLIN HFA) 108 (90 Base) MCG/ACT inhaler Inhale 2 puffs into the lungs every 6 (six) hours as needed for wheezing or shortness of breath.    amiodarone (PACERONE) 200 MG tablet Take 1 tablet (200 mg total) by mouth daily.    Blood Glucose Monitoring Suppl (BLOOD GLUCOSE MONITOR SYSTEM) W/DEVICE KIT by Does not apply route. Use to check sugar once daily and as needed Dx: E11.9 **ONE TOUCH VERIO**    Budeson-Glycopyrrol-Formoterol (BREZTRI AEROSPHERE) 160-9-4.8 MCG/ACT AERO Inhale 2 puffs into the lungs 2 (two) times daily.    carvedilol (COREG) 6.25 MG tablet TAKE 1 TABLET BY MOUTH TWICE A DAY WITH FOOD    Cholecalciferol (VITAMIN D) 50 MCG (2000 UT) CAPS Take 1 capsule (2,000 Units total) by mouth daily. (Patient not taking: Reported on 02/23/2022)    dapagliflozin propanediol (FARXIGA) 5 MG TABS tablet Take 1 tablet (5 mg total) by mouth daily before breakfast.    Docusate Calcium (STOOL SOFTENER PO) Take 1 tablet by mouth daily. (Patient not taking: Reported on 02/23/2022)    FLUoxetine (PROZAC) 40 MG capsule Take 1 capsule (40 mg total) by mouth daily.    fluticasone (FLONASE) 50 MCG/ACT nasal spray Place 2 sprays into both nostrils daily. (Patient taking differently: Place 2 sprays into both nostrils daily as needed.)    furosemide (LASIX) 80 MG tablet Take 80 mg by mouth 2 (two) times daily.    glimepiride (AMARYL) 4 MG tablet Take 1 tablet (4 mg total) by mouth daily with breakfast.    glucose blood (ONETOUCH VERIO) test strip Check blood sugar 3 times a day    ipratropium-albuterol (DUONEB) 0.5-2.5 (3) MG/3ML  SOLN Take 3 mLs by nebulization every 6 (six) hours as needed (severe shortness of breath/wheezing).    metFORMIN (GLUCOPHAGE) 1000 MG tablet Take 1 tablet (1,000 mg total) by mouth 2 (two) times daily with a meal.    metolazone (ZAROXOLYN) 2.5 MG tablet Take 1 tablet (2.5 mg total) by mouth 2 (two) times a week. (Patient taking differently: Take 2.5 mg by mouth 2 (two) times a week. Saturday and wednesday)    montelukast (SINGULAIR) 10 MG tablet TAKE 1 TABLET BY MOUTH EVERYDAY AT BEDTIME    pantoprazole (PROTONIX) 40 MG tablet TAKE 1 TABLET (40 MG TOTAL) BY MOUTH TWICE A DAY BEFORE MEALS    potassium chloride SA (KLOR-CON M) 10 MEQ tablet Take 1 tablet (10 mEq total) by mouth daily.    Probiotic Product (PROBIOTIC DAILY PO) Take 1 tablet by mouth daily.    simvastatin (ZOCOR) 20 MG tablet TAKE 1 TABLET BY MOUTH EVERY DAY IN THE EVENING    torsemide (DEMADEX) 10 MG tablet Take 10 mg by mouth daily. 02/23/2022: PT NOT SURE IF PCP STOPPED THIS MED, BUT HAS FOUND A BOTTLE AND RESTARTED TAKING IT.   vitamin B-12 (CYANOCOBALAMIN) 1000 MCG tablet Take 1 tablet (1,000 mcg total) by mouth every Monday, Wednesday, and Friday. (Patient not taking: Reported on 02/23/2022)    warfarin (COUMADIN) 5 MG tablet TAKE 1/2 TABLET BY MOUTH DAILY EXCEPT TAKE NOTHING ON WEDNESDAYS OR AS DIRECTED BY ANTICOAGULATION CLINIC (Patient taking differently: TAKE  1/2 TABLET BY MOUTH DAILY EXCEPT TAKE NOTHING ON WEDNESDAYS OR SATURDAY AS DIRECTED BY ANTICOAGULATION CLINIC)    No facility-administered encounter medications on file as of 02/24/2022.   [name] was contacted to remind of upcoming telephone visit with [name] on 03/01/2022 at 1:30. Patient was reminded to have any blood glucose and blood pressure readings available for review at appointment.   Message was left reminding patient of appointment.  CCM referral has been placed prior to visit?  Yes   Star Rating Drugs: Medication:  Last Fill: Day  Supply Glimepiride 4 mg 02/14/2022 90 Metformin 1000 mg 01/23/2022 90  Simvastatin 20 mg 02/16/2022 90  Reviewed hospital notes for details of recent visit. Has patient been contacted by Transitions of Care team? No Has patient seen PCP/specialist for hospital follow up (summarize OV if yes): No  Admitted to the ED on 02/22/2022. Discharge date was 02/22/2022.  Discharged from [facility].   Discharge diagnosis (Principal Problem): Symptomatic anemia Patient was discharged to Home  Brief summary of hospital course: [name] is a 66 y.o. male with COPD on chronic oxygen, diabetes, hypertension, hyperlipidemia, CAD, anemia who presents to the emergency department with complaints of feeling lightheaded and increasingly short of breath.  Was told by his doctor on Friday that he was anemic and needed to come to the hospital for blood transfusion.  States he has been on iron tablets before but recently stopped them as they were causing constipation.  Denies bloody stools, melena.  He is on Coumadin for history of a flutter  I discussed the patient with Dr. Francine Graven who evaluated him for admission but stated that he did not in fact want to be admitted.  The patient had received 1 unit of PRBCs and is feeling significantly better.  I went to reassess him.  He states he is feeling fine and would like to go home today if at all possible.  His vital signs are stable and he appears well.  He agreed to a repeat CBC and the hemoglobin is now 7.6.  I recommended transfusion of the second unit before we send him home and he agrees with this plan.  However I will not readmit him.   The patient has received a second unit of blood.  He continues to be asymptomatic and well-appearing.  His vital signs are normal.  He is eager to be discharged home.  He has already contacted his primary care doctor for follow-up within the next few days.  Given that the patient is hemodynamically stable and that  his hemoglobin was already improving after the first unit, there is no indication for a repeat CBC at this time and I think that discharge with close follow-up is reasonable.  I gave the patient strict return precautions and he expressed understanding.  I also advised that he may return at any time if he changes his mind and wishes to resume his care in the hospital.  Medications that remain the same after Hospital Discharge:??  -All other medications will remain the same.    Next CCM appt: 03/01/2022  Other upcoming appts: PCP appointment on 03/02/2022  [name], PharmD notified and will determine if action is needed.  [name], CPP notified  Marijean Niemann, [place] Clinical Pharmacy Assistant [phone]

## 2022-02-24 NOTE — Telephone Encounter (Signed)
Pt called the office stating that he was returning a phone call. I do not see any message in pt's chart about this. Routing to [place] triage. Please advise.

## 2022-02-25 ENCOUNTER — Encounter: Payer: Self-pay | Admitting: Family

## 2022-02-25 ENCOUNTER — Ambulatory Visit: Payer: Medicare Other | Attending: Family | Admitting: Family

## 2022-02-25 VITALS — BP 138/76 | HR 75 | Resp 20 | Ht 71.0 in | Wt 264.0 lb

## 2022-02-25 DIAGNOSIS — D508 Other iron deficiency anemias: Secondary | ICD-10-CM

## 2022-02-25 DIAGNOSIS — I252 Old myocardial infarction: Secondary | ICD-10-CM | POA: Diagnosis not present

## 2022-02-25 DIAGNOSIS — Z9981 Dependence on supplemental oxygen: Secondary | ICD-10-CM | POA: Diagnosis not present

## 2022-02-25 DIAGNOSIS — L03115 Cellulitis of right lower limb: Secondary | ICD-10-CM | POA: Diagnosis not present

## 2022-02-25 DIAGNOSIS — L03116 Cellulitis of left lower limb: Secondary | ICD-10-CM | POA: Diagnosis not present

## 2022-02-25 DIAGNOSIS — I5032 Chronic diastolic (congestive) heart failure: Secondary | ICD-10-CM

## 2022-02-25 DIAGNOSIS — L97229 Non-pressure chronic ulcer of left calf with unspecified severity: Secondary | ICD-10-CM | POA: Diagnosis not present

## 2022-02-25 DIAGNOSIS — L97829 Non-pressure chronic ulcer of other part of left lower leg with unspecified severity: Secondary | ICD-10-CM | POA: Diagnosis not present

## 2022-02-25 DIAGNOSIS — F32A Depression, unspecified: Secondary | ICD-10-CM | POA: Diagnosis not present

## 2022-02-25 DIAGNOSIS — E119 Type 2 diabetes mellitus without complications: Secondary | ICD-10-CM

## 2022-02-25 DIAGNOSIS — I4819 Other persistent atrial fibrillation: Secondary | ICD-10-CM

## 2022-02-25 DIAGNOSIS — F419 Anxiety disorder, unspecified: Secondary | ICD-10-CM | POA: Diagnosis not present

## 2022-02-25 DIAGNOSIS — Z79899 Other long term (current) drug therapy: Secondary | ICD-10-CM | POA: Diagnosis not present

## 2022-02-25 DIAGNOSIS — I872 Venous insufficiency (chronic) (peripheral): Secondary | ICD-10-CM | POA: Diagnosis not present

## 2022-02-25 DIAGNOSIS — I420 Dilated cardiomyopathy: Secondary | ICD-10-CM | POA: Diagnosis not present

## 2022-02-25 DIAGNOSIS — J441 Chronic obstructive pulmonary disease with (acute) exacerbation: Secondary | ICD-10-CM | POA: Diagnosis not present

## 2022-02-25 DIAGNOSIS — K429 Umbilical hernia without obstruction or gangrene: Secondary | ICD-10-CM | POA: Insufficient documentation

## 2022-02-25 DIAGNOSIS — Z87891 Personal history of nicotine dependence: Secondary | ICD-10-CM | POA: Diagnosis not present

## 2022-02-25 DIAGNOSIS — K219 Gastro-esophageal reflux disease without esophagitis: Secondary | ICD-10-CM | POA: Diagnosis not present

## 2022-02-25 DIAGNOSIS — E1165 Type 2 diabetes mellitus with hyperglycemia: Secondary | ICD-10-CM | POA: Diagnosis not present

## 2022-02-25 DIAGNOSIS — I2510 Atherosclerotic heart disease of native coronary artery without angina pectoris: Secondary | ICD-10-CM | POA: Insufficient documentation

## 2022-02-25 DIAGNOSIS — D649 Anemia, unspecified: Secondary | ICD-10-CM | POA: Insufficient documentation

## 2022-02-25 DIAGNOSIS — I10 Essential (primary) hypertension: Secondary | ICD-10-CM

## 2022-02-25 DIAGNOSIS — I110 Hypertensive heart disease with heart failure: Secondary | ICD-10-CM | POA: Diagnosis not present

## 2022-02-25 DIAGNOSIS — Z7901 Long term (current) use of anticoagulants: Secondary | ICD-10-CM | POA: Diagnosis not present

## 2022-02-25 DIAGNOSIS — E785 Hyperlipidemia, unspecified: Secondary | ICD-10-CM | POA: Insufficient documentation

## 2022-02-25 DIAGNOSIS — Z5181 Encounter for therapeutic drug level monitoring: Secondary | ICD-10-CM | POA: Diagnosis not present

## 2022-02-25 DIAGNOSIS — M1990 Unspecified osteoarthritis, unspecified site: Secondary | ICD-10-CM | POA: Diagnosis not present

## 2022-02-25 DIAGNOSIS — I4891 Unspecified atrial fibrillation: Secondary | ICD-10-CM | POA: Diagnosis not present

## 2022-02-25 DIAGNOSIS — G4733 Obstructive sleep apnea (adult) (pediatric): Secondary | ICD-10-CM | POA: Insufficient documentation

## 2022-02-25 DIAGNOSIS — G47419 Narcolepsy without cataplexy: Secondary | ICD-10-CM | POA: Insufficient documentation

## 2022-02-25 DIAGNOSIS — G43909 Migraine, unspecified, not intractable, without status migrainosus: Secondary | ICD-10-CM | POA: Diagnosis not present

## 2022-02-25 DIAGNOSIS — J449 Chronic obstructive pulmonary disease, unspecified: Secondary | ICD-10-CM | POA: Diagnosis not present

## 2022-02-25 DIAGNOSIS — I25119 Atherosclerotic heart disease of native coronary artery with unspecified angina pectoris: Secondary | ICD-10-CM | POA: Diagnosis not present

## 2022-02-25 DIAGNOSIS — Z9181 History of falling: Secondary | ICD-10-CM | POA: Diagnosis not present

## 2022-02-25 DIAGNOSIS — E876 Hypokalemia: Secondary | ICD-10-CM | POA: Diagnosis not present

## 2022-02-25 DIAGNOSIS — I5022 Chronic systolic (congestive) heart failure: Secondary | ICD-10-CM | POA: Diagnosis not present

## 2022-02-25 DIAGNOSIS — I5042 Chronic combined systolic (congestive) and diastolic (congestive) heart failure: Secondary | ICD-10-CM | POA: Insufficient documentation

## 2022-02-25 DIAGNOSIS — I5033 Acute on chronic diastolic (congestive) heart failure: Secondary | ICD-10-CM | POA: Diagnosis not present

## 2022-02-25 MED ORDER — DAPAGLIFLOZIN PROPANEDIOL 10 MG PO TABS: 10.0000 mg | ORAL_TABLET | Freq: Every day | ORAL | 5 refills | Status: DC

## 2022-02-25 MED ORDER — METOLAZONE 2.5 MG PO TABS: 2.5000 mg | ORAL_TABLET | ORAL | 5 refills | Status: DC

## 2022-02-25 MED ORDER — POTASSIUM CHLORIDE CRYS ER 10 MEQ PO TBCR: 10.0000 meq | EXTENDED_RELEASE_TABLET | Freq: Every day | ORAL | 5 refills | Status: DC

## 2022-02-25 NOTE — Patient Instructions (Addendum)
Continue weighing daily and call for an overnight weight gain of 3 pounds or more or a weekly weight gain of more than 5 pounds.   If you have voicemail, please make sure your mailbox is cleaned out so that we may leave a message and please make sure to listen to any voicemails.    On the days you take the metolazone, take 2 potassium tablets on those days.    Finish your current farxiga dose by taking 2 tablets daily until it's gone. Then you will begin taking a '10mg'$  tablet once daily

## 2022-02-25 NOTE — Progress Notes (Signed)
Patient ID: [name], male    DOB: [birth year], 66 y.o.   MRN: [id number]  HPI  [name] is a 66 y/o male with a history of atrial fibrillation, narcolepsy, CAD, DM, hyperlipidemia, HTN, anemia, anxiety, COPD, OSA, previous tobacco use and chronic heart failure.   Echo report from 11/19/21 reviewed and showed an EF of 60-65% along with mild LVH/ LAE.  Was in the ED 02/23/22 due to SOB and lightheadedness and found to be anemic. Given 2 units of blood and released with improvement of symptoms and increase in hemoglobin.  Admitted 01/03/22 due to leg pain, shortness of breath and pedal edema. Placed on non-rebreather mask due to hypoxia. Initially given IV lasix with transition to oral diuretics. Given antibiotics for sepsis. Needed solu-medrol and then changed to oral prednisone. Weaned to oxygen via nasal cannula. Discharged after 2 days. Was in the ED 12/28/21 due to chest wall pain where he was evaluated and released.    He presents today for a follow-up visit with a chief complaint of minimal fatigue upon moderate exertion. Describes this as chronic in nature having been present for several years. He has associated cough, shortness of breath (improving), pedal edema and dizziness along with this. He denies any difficulty sleeping, chest pain, palpitations, abdominal distention or weight gain.   Says that he feels better since he received 2 units of blood but that his hemoglobin is still low and he's not sure why. Says that he's been worked up in the past with "all kinds of scops" and couldn't find a bleeding source. Reports that iron tablets made him constipated.   Drinking 2 pots of coffee, 16 ounces diet tea and 20 ounce snapple daily.   Takes metolazone twice weekly.   Past Medical History:  Diagnosis Date   Abnormal drug screen 2015   MJ positive x3 - one more and we will stop prescribing ativan (08/2013).   Anemia    Angina    Anxiety    Arrhythmia    atrial fibrillation    Arthritis    CAD (coronary artery disease)    nonobstructive   Cannabis abuse    Chronic systolic CHF (congestive heart failure) ([place]) 2013   NYHA Class II/III   COPD (chronic obstructive pulmonary disease) ([place])    Diabetes type 2, uncontrolled    declines DSME   Dilated cardiomyopathy ([place]) 2013   now improved   Ex-smoker    Frequent headaches    History of atrial fibrillation 2013   chronic, s/p ablation prior on coumadin   Hyperlipidemia    Hypertension    Migraine    Narcolepsy    Nonischemic cardiomyopathy ([place]) 2013   EF 25% per Dr Nehemiah Massed   Obesity    OSA (obstructive sleep apnea)    does not use CPAP - unable to tolerate   Seasonal allergies    Past Surgical History:  Procedure Laterality Date   ATRIAL FLUTTER ABLATION N/A 09/21/2011   Procedure: ATRIAL FLUTTER ABLATION;  Surgeon: Thompson Grayer, MD;  Location: [facility] CATH LAB;  Service: Cardiovascular;  Laterality: N/A;   CARDIAC ELECTROPHYSIOLOGY MAPPING AND ABLATION  2013   for atrial flutter   CARDIOVASCULAR STRESS TEST  03/2012   ETT WNL Nehemiah Massed)   CARDIOVERSION N/A 08/12/2021   Procedure: CARDIOVERSION;  Surgeon: Corey Skains, MD;  Location: [facility] ORS;  Service: Cardiovascular;  Laterality: N/A;   COLONOSCOPY WITH PROPOFOL N/A 05/22/2020   TA, diverticulosis, descending colon ulcer biopsy WNL, int  hem Allen Norris, Darren, MD)   ESOPHAGOGASTRODUODENOSCOPY (EGD) WITH PROPOFOL N/A 05/22/2020   WNL ([place])   US ECHOCARDIOGRAPHY  2014   EF 50%, nl LV fxn, RV nl size/function, mild mitral insuff   Family History  Problem Relation Age of Onset   Heart failure Father 32   Cancer Mother 65       NHL   Hypertension Maternal Grandfather    CAD Maternal Grandfather 76       MI   Diabetes Other        grandparents   Cancer Brother 20       lung (smoker)   Social History   Tobacco Use   Smoking status: Former    Packs/day: 1.00    Years: 31.00    Total pack years: 31.00    Types: Cigarettes    Quit date:  04/26/2018    Years since quitting: 3.8   Smokeless tobacco: Never  Substance Use Topics   Alcohol use: No    Alcohol/week: 0.0 standard drinks of alcohol   Allergies  Allergen Reactions   Atorvastatin Other (See Comments)    Dizziness   Lisinopril Cough   Prior to Admission medications   Medication Sig Start Date End Date Taking? Authorizing Provider  ACCU-CHEK FASTCLIX LANCETS MISC Check blood sugar once daily and as instructed. Dx 250.00 05/10/13  Yes [name], MD  albuterol (VENTOLIN HFA) 108 (90 Base) MCG/ACT inhaler Inhale 2 puffs into the lungs every 6 (six) hours as needed for wheezing or shortness of breath. 01/12/22  Yes [name], MD  amiodarone (PACERONE) 200 MG tablet Take 1 tablet (200 mg total) by mouth daily. 09/16/21  Yes Vickie Epley, MD  Blood Glucose Monitoring Suppl (BLOOD GLUCOSE MONITOR SYSTEM) W/DEVICE KIT by Does not apply route. Use to check sugar once daily and as needed Dx: E11.9 **ONE TOUCH VERIO**   Yes [provider]  Budeson-Glycopyrrol-Formoterol (BREZTRI AEROSPHERE) 160-9-4.8 MCG/ACT AERO Inhale 2 puffs into the lungs 2 (two) times daily. 10/23/21  Yes [name], MD  carvedilol (COREG) 6.25 MG tablet TAKE 1 TABLET BY MOUTH TWICE A DAY WITH FOOD 01/26/22  Yes [name], MD  Cholecalciferol (VITAMIN D) 50 MCG (2000 UT) CAPS Take 1 capsule (2,000 Units total) by mouth daily. 02/05/19  Yes [name], MD  dapagliflozin propanediol (FARXIGA) 10 MG TABS tablet Take 1 tablet (10 mg total) by mouth daily before breakfast. 02/25/22  Yes Alisa Graff, FNP  Docusate Calcium (STOOL SOFTENER PO) Take 1 tablet by mouth daily.   Yes [provider]  FLUoxetine (PROZAC) 40 MG capsule Take 1 capsule (40 mg total) by mouth daily. 02/19/22  Yes [name], MD  furosemide (LASIX) 80 MG tablet Take 80 mg by mouth 2 (two) times daily. 12/25/21  Yes [provider]  glimepiride (AMARYL) 4 MG tablet Take 1  tablet (4 mg total) by mouth daily with breakfast. 01/14/22  Yes [name], MD  glucose blood [facility] VERIO) test strip Check blood sugar 3 times a day 10/22/21  Yes [name], MD  ipratropium-albuterol (DUONEB) 0.5-2.5 (3) MG/3ML SOLN Take 3 mLs by nebulization every 6 (six) hours as needed (severe shortness of breath/wheezing). 01/05/22  Yes Emeterio Reeve, DO  metFORMIN (GLUCOPHAGE) 1000 MG tablet Take 1 tablet (1,000 mg total) by mouth 2 (two) times daily with a meal. 01/14/22  Yes [name], MD  montelukast (SINGULAIR) 10 MG tablet TAKE 1 TABLET BY MOUTH EVERYDAY AT BEDTIME 07/07/21  Yes Danise Mina,  Garlon Hatchet, MD  pantoprazole (PROTONIX) 40 MG tablet TAKE 1 TABLET (40 MG TOTAL) BY MOUTH TWICE A DAY BEFORE MEALS 01/11/22  Yes [name], MD  Probiotic Product (PROBIOTIC DAILY PO) Take 1 tablet by mouth daily.   Yes [provider]  simvastatin (ZOCOR) 20 MG tablet TAKE 1 TABLET BY MOUTH EVERY DAY IN THE EVENING 12/29/21  Yes [name], MD  vitamin B-12 (CYANOCOBALAMIN) 1000 MCG tablet Take 1 tablet (1,000 mcg total) by mouth every Monday, Wednesday, and Friday. 07/22/21  Yes [name], MD  warfarin (COUMADIN) 5 MG tablet TAKE 1/2 TABLET BY MOUTH DAILY EXCEPT TAKE NOTHING ON WEDNESDAYS OR AS DIRECTED BY ANTICOAGULATION CLINIC Patient taking differently: TAKE 1/2 TABLET BY MOUTH DAILY EXCEPT TAKE NOTHING ON WEDNESDAYS OR SATURDAY AS DIRECTED BY ANTICOAGULATION CLINIC 11/23/21  Yes [name], MD  fluticasone [facility]) 50 MCG/ACT nasal spray Place 2 sprays into both nostrils daily. Patient not taking: Reported on 02/25/2022 01/12/22   [name], MD  metolazone (ZAROXOLYN) 2.5 MG tablet Take 1 tablet (2.5 mg total) by mouth 2 (two) times a week. Saturday and wednesday 02/25/22   Darylene Price A, FNP  potassium chloride (KLOR-CON M) 10 MEQ tablet Take 1 tablet (10 mEq total) by mouth daily. And an extra one on the days you take metolazone  02/25/22   Darylene Price A, FNP  torsemide (DEMADEX) 10 MG tablet Take 10 mg by mouth daily. Patient not taking: Reported on 02/25/2022    [provider]    Review of Systems  Constitutional:  Positive for fatigue. Negative for appetite change.  HENT:  Positive for congestion. Negative for postnasal drip and sore throat.   Eyes: Negative.   Respiratory:  Positive for cough (early morning) and shortness of breath (improving). Negative for chest tightness.   Cardiovascular:  Positive for leg swelling (improving). Negative for chest pain and palpitations.  Gastrointestinal:  Negative for abdominal distention, abdominal pain, anal bleeding and blood in stool.  Endocrine: Negative.   Genitourinary:  Negative for hematuria.  Musculoskeletal:  Positive for neck pain. Negative for back pain.  Skin: Negative.   Allergic/Immunologic: Negative.   Neurological:  Positive for dizziness (with sudden position changes). Negative for light-headedness.  Hematological:  Negative for adenopathy. Bruises/bleeds easily.  Psychiatric/Behavioral:  Negative for dysphoric mood and sleep disturbance (sleeping on 2-3 pillows; oxygen at 2L at home and bedtime). The patient is not nervous/anxious.    Vitals:   02/25/22 1234  BP: 138/76  Pulse: 75  Resp: 20  SpO2: 93%  Weight: 264 lb (119.7 kg)  Height: 5' 11" (1.803 m)   Wt Readings from Last 3 Encounters:  02/25/22 264 lb (119.7 kg)  02/22/22 262 lb (118.8 kg)  02/19/22 262 lb 8 oz (119.1 kg)   Lab Results  Component Value Date   CREATININE 0.72 02/22/2022   CREATININE 0.89 02/19/2022   CREATININE 0.90 01/12/2022   Physical Exam Vitals and nursing note reviewed.  Constitutional:      Appearance: He is well-developed.  HENT:     Head: Normocephalic and atraumatic.  Cardiovascular:     Rate and Rhythm: Normal rate and regular rhythm.  Pulmonary:     Effort: Pulmonary effort is normal.     Breath sounds: No wheezing, rhonchi or rales.   Abdominal:     Palpations: Abdomen is soft.     Tenderness: There is no abdominal tenderness.     Comments: Umbilical hernia present  Musculoskeletal:     Cervical back:  Normal range of motion and neck supple.     Right lower leg: No tenderness. Edema (trace pitting) present.     Left lower leg: No tenderness. Edema (trace pitting) present.  Skin:    General: Skin is warm and dry.  Neurological:     General: No focal deficit present.     Mental Status: He is alert and oriented to person, place, and time.  Psychiatric:        Mood and Affect: Mood normal.        Behavior: Behavior normal.    Assessment & Plan:  1: Chronic heart failure with preserved ejection fraction with structural changes (LVH/LAE)- - NYHA class II - euvolemic today - weighing daily; reminded to call for an overnight weight gain of > 2 pounds or a weekly weight gain of > 5 pounds - weight down 10 pounds from last visit here 6 weeks ago - not adding salt and is reading food labels for sodium content - reviewed the importance of keeping daily fluid intake to 60 ounces / day; he's currently drinking 2 pots of coffee, 16 ounces diet tea and 20 ounces of snapple daily - saw cardiology Nehemiah Massed) 02/15/22 - on GDMT of farxiga (although 44m dose) - increase farxiga to 154mdaily; he can finish his current bottle by taking 2 tablets daily until gone and then when he starts the new bottle, it will be back to 1 tablet daily - check BMP next visit - consider entresto next visit - taking metolazone twice weekly; this was refilled today - advised that on the days he takes the metolazone, he needs to take 2 potassium tablets on those days - BNP 01/03/22 was 289.4  2: HTN- - BP looks good (138/76) - home health BP on 11/1 was 96/58 - saw PCP (GDanise Mina10/27/23 - BMP 02/22/22 reviewed and showed sodium 140, potassium 3.4, creatinine 0.72 and GFR >60  3: DM- - A1c 01/03/22 was 8.6% - home glucose today was 202  4:  Atrial fibrillation- - saw EP (LQuentin Ore5/24/23 - taking amiodarone, carvedilol and warfarin  5: COPD- - wearing oxygen at 2L at home and at bedtime - has CPAP but says that it's old and doesn't work right - saw pulmonology (Dgayli) 01/18/22  6: Anemia- - recent ED visit with 2 blood transfusions - Hg 02/23/22 was 7.6 - reports oral iron makes him constipated - taking B12 3 times/ week   Medication list reviewed.   Return in 1 month, sooner if needed

## 2022-02-25 NOTE — Telephone Encounter (Signed)
I have spoke with [name] and his PFT has been scheduled on 03/23/22 @ 2:00pm [facility]

## 2022-02-26 ENCOUNTER — Encounter: Payer: Self-pay | Admitting: Family

## 2022-03-01 ENCOUNTER — Other Ambulatory Visit: Payer: Self-pay | Admitting: Family

## 2022-03-01 ENCOUNTER — Telehealth: Payer: Self-pay | Admitting: Family Medicine

## 2022-03-01 ENCOUNTER — Telehealth: Payer: Medicare Other

## 2022-03-01 ENCOUNTER — Telehealth: Payer: Self-pay | Admitting: Pharmacist

## 2022-03-01 MED ORDER — DAPAGLIFLOZIN PROPANEDIOL 10 MG PO TABS: 10.0000 mg | ORAL_TABLET | Freq: Every day | ORAL | 3 refills | Status: DC

## 2022-03-01 NOTE — Telephone Encounter (Signed)
Contacted the patient and rescheduled telephone appointment .  [name], CPP notified  Avel Sensor, [place]  [phone]

## 2022-03-01 NOTE — Telephone Encounter (Signed)
Please reschedule patient.

## 2022-03-01 NOTE — Telephone Encounter (Signed)
Patient called and stated he missed a phone call from [place] and wanted a call back.

## 2022-03-01 NOTE — Telephone Encounter (Signed)
  Chronic Care Management   Outreach Note  03/01/2022 Name: [name] MRN: [id number] DOB: [birth year]  Referred by: [name], MD  Patient had a phone appointment scheduled with clinical pharmacist today.  An unsuccessful telephone outreach was attempted today. The patient was referred to the pharmacist for assistance with medications, care management and care coordination.   Patient will NOT be penalized in any way for missing a CCM appointment. The no-show fee does not apply.  If possible, a message was left to return call to: [phone] or to [facility].  [name], PharmD, BCACP Clinical Pharmacist [facility] [phone]

## 2022-03-01 NOTE — Progress Notes (Signed)
Sent farxiga RX to [facility] patient assistance program

## 2022-03-01 NOTE — Telephone Encounter (Signed)
In reviewing chart for follow up visit, I found that [name] was increased to 10 mg last week per Cardiology ([place]) and Rx sent to [facility]. Patient gets [name] free through [employer] patient assistance, eRx needs to be sent to [facility] pharmacy instead.  Routing to prescriber for eRx.

## 2022-03-01 NOTE — Progress Notes (Deleted)
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 not sleep well, breakfast is sometimes 2pm -Current medications: Metformin 1000 mg BID - Appropriate, Query Effective Glimepiride 4 mg daily -Appropriate, Query Effective Farxiga 5 mg - 2 tab daily -*** [employer] supplies -Medications previously tried: [place] (cost), Januvia  -Educated on A1c and blood sugar goals; Complications of diabetes including kidney damage, retinal damage, and cardiovascular disease; Benefits of routine self-monitoring of blood sugar; -Recommended to continue current medication; will coordinate with  [employer] to ship [place]  COPD (Goal: control symptoms and prevent exacerbations) -Controlled - 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; he has 2 inhalers remaining from [employer] -Gold Grade: Gold 3 (FEV1 30-49%) -Current COPD Classification:  D (high sx, >/=2 exacerbations/yr) -Pulmonary function testing: Spirometry 05/2018: pre: FVC 54%, FEV1 51%, ratio 0.71 post: FVC 54%, FEV1 49%, ratio 0.69 -Exacerbations requiring treatment in last 6 months: 0 (last 07/2020) -Current treatment  Breztri 160-9-4.8 mcg/act 2 puff BID (PAP)- Appropriate, Effective, Safe, Accessible Atrovent (ipratropium) 2 puffs PRN - Appropriate, Effective, Safe, Accessible Albuterol HFA -Appropriate, Effective, Safe, Accessible Albuterol neb -Appropriate, Effective, Safe, Accessible Montelukast 10 mg daily HS -Appropriate, Effective, Safe, Accessible Flonase nasal spray -Appropriate, Effective, Safe, Accessible -Medications previously tried: Trelegy, Advair, Spiriva -Patient reports consistent use of maintenance inhaler -Counseled on Proper inhaler technique; Benefits of consistent maintenance inhaler use -Recommend to continue current medication  Depression/Anxiety (Goal: ***) -{US controlled/uncontrolled:25276} -PHQ9: 0 (12/2021) - minimal depression -GAD7: 8 (12/2021) - mild/moderate anxiety; hx panic attacks -Connected with PCP for mental health support -Current treatment: Fluoxetine 40 mg daily -Medications previously tried/failed: citalopram, hydroxyzine (ineffective) -Educated on {CCM mental health counseling:25127} -{CCMPHARMDINTERVENTION:25122}  Insomnia (Goal: manage symptoms) -Uncontrolled  -Hx OSA on CPAP; he is noncompliant with CPAP due to uncomfortable mask, he has not tried new mask in a few years; he has been referred to pulmonology but has not made appt yet; pt provided with phone number for pulm office and advised to call for appt  Patient Goals/Self-Care  Activities Patient will:  - take medications as prescribed as evidenced by patient report and record review focus on medication adherence by routine check glucose daily, document, and provide at future appointments check blood pressure daily, document, and provide at future appointments

## 2022-03-02 ENCOUNTER — Ambulatory Visit: Payer: Medicare Other | Admitting: Family Medicine

## 2022-03-04 ENCOUNTER — Ambulatory Visit (INDEPENDENT_AMBULATORY_CARE_PROVIDER_SITE_OTHER): Payer: Medicare Other

## 2022-03-04 DIAGNOSIS — Z7901 Long term (current) use of anticoagulants: Secondary | ICD-10-CM | POA: Diagnosis not present

## 2022-03-04 DIAGNOSIS — E876 Hypokalemia: Secondary | ICD-10-CM | POA: Diagnosis not present

## 2022-03-04 DIAGNOSIS — L03115 Cellulitis of right lower limb: Secondary | ICD-10-CM | POA: Diagnosis not present

## 2022-03-04 DIAGNOSIS — E1165 Type 2 diabetes mellitus with hyperglycemia: Secondary | ICD-10-CM | POA: Diagnosis not present

## 2022-03-04 DIAGNOSIS — G43909 Migraine, unspecified, not intractable, without status migrainosus: Secondary | ICD-10-CM | POA: Diagnosis not present

## 2022-03-04 DIAGNOSIS — E785 Hyperlipidemia, unspecified: Secondary | ICD-10-CM | POA: Diagnosis not present

## 2022-03-04 DIAGNOSIS — L97229 Non-pressure chronic ulcer of left calf with unspecified severity: Secondary | ICD-10-CM | POA: Diagnosis not present

## 2022-03-04 DIAGNOSIS — I25119 Atherosclerotic heart disease of native coronary artery with unspecified angina pectoris: Secondary | ICD-10-CM | POA: Diagnosis not present

## 2022-03-04 DIAGNOSIS — I4891 Unspecified atrial fibrillation: Secondary | ICD-10-CM | POA: Diagnosis not present

## 2022-03-04 DIAGNOSIS — J441 Chronic obstructive pulmonary disease with (acute) exacerbation: Secondary | ICD-10-CM | POA: Diagnosis not present

## 2022-03-04 DIAGNOSIS — G4733 Obstructive sleep apnea (adult) (pediatric): Secondary | ICD-10-CM | POA: Diagnosis not present

## 2022-03-04 DIAGNOSIS — K219 Gastro-esophageal reflux disease without esophagitis: Secondary | ICD-10-CM | POA: Diagnosis not present

## 2022-03-04 DIAGNOSIS — F32A Depression, unspecified: Secondary | ICD-10-CM | POA: Diagnosis not present

## 2022-03-04 DIAGNOSIS — I5022 Chronic systolic (congestive) heart failure: Secondary | ICD-10-CM | POA: Diagnosis not present

## 2022-03-04 DIAGNOSIS — L97829 Non-pressure chronic ulcer of other part of left lower leg with unspecified severity: Secondary | ICD-10-CM | POA: Diagnosis not present

## 2022-03-04 DIAGNOSIS — L03116 Cellulitis of left lower limb: Secondary | ICD-10-CM | POA: Diagnosis not present

## 2022-03-04 DIAGNOSIS — I872 Venous insufficiency (chronic) (peripheral): Secondary | ICD-10-CM | POA: Diagnosis not present

## 2022-03-04 DIAGNOSIS — I420 Dilated cardiomyopathy: Secondary | ICD-10-CM | POA: Diagnosis not present

## 2022-03-04 DIAGNOSIS — M1990 Unspecified osteoarthritis, unspecified site: Secondary | ICD-10-CM | POA: Diagnosis not present

## 2022-03-04 DIAGNOSIS — I5033 Acute on chronic diastolic (congestive) heart failure: Secondary | ICD-10-CM | POA: Diagnosis not present

## 2022-03-04 DIAGNOSIS — I110 Hypertensive heart disease with heart failure: Secondary | ICD-10-CM | POA: Diagnosis not present

## 2022-03-04 DIAGNOSIS — I252 Old myocardial infarction: Secondary | ICD-10-CM | POA: Diagnosis not present

## 2022-03-04 DIAGNOSIS — Z9181 History of falling: Secondary | ICD-10-CM | POA: Diagnosis not present

## 2022-03-04 DIAGNOSIS — Z5181 Encounter for therapeutic drug level monitoring: Secondary | ICD-10-CM | POA: Diagnosis not present

## 2022-03-04 LAB — ABO/RH: ABO/RH(D): A POS

## 2022-03-04 LAB — POCT INR: INR: 2.1 (ref 2.0–3.0)

## 2022-03-04 NOTE — Progress Notes (Signed)
[name] from [facility] called and LVM with INR result of 2.1.   LVM with pt and [name] with instructions to continue taking 1/2 tablet (2.5 mg) once a day except take no warfarin on Sundays or Wednesdays. Recheck in 1 week.

## 2022-03-04 NOTE — Patient Instructions (Signed)
Continue taking 1/2 tablet (2.5 mg) once a day except take no warfarin on Sundays or Wednesdays. Recheck in 1 week.

## 2022-03-05 ENCOUNTER — Ambulatory Visit: Payer: Medicare Other | Admitting: Pharmacist

## 2022-03-05 ENCOUNTER — Ambulatory Visit: Payer: Self-pay

## 2022-03-05 ENCOUNTER — Telehealth: Payer: Self-pay | Admitting: Family Medicine

## 2022-03-05 ENCOUNTER — Telehealth: Payer: Self-pay

## 2022-03-05 DIAGNOSIS — D689 Coagulation defect, unspecified: Secondary | ICD-10-CM

## 2022-03-05 DIAGNOSIS — D508 Other iron deficiency anemias: Secondary | ICD-10-CM

## 2022-03-05 DIAGNOSIS — I4819 Other persistent atrial fibrillation: Secondary | ICD-10-CM

## 2022-03-05 DIAGNOSIS — I5032 Chronic diastolic (congestive) heart failure: Secondary | ICD-10-CM

## 2022-03-05 DIAGNOSIS — J9611 Chronic respiratory failure with hypoxia: Secondary | ICD-10-CM

## 2022-03-05 DIAGNOSIS — G4733 Obstructive sleep apnea (adult) (pediatric): Secondary | ICD-10-CM

## 2022-03-05 DIAGNOSIS — F331 Major depressive disorder, recurrent, moderate: Secondary | ICD-10-CM

## 2022-03-05 DIAGNOSIS — E1169 Type 2 diabetes mellitus with other specified complication: Secondary | ICD-10-CM

## 2022-03-05 DIAGNOSIS — J449 Chronic obstructive pulmonary disease, unspecified: Secondary | ICD-10-CM

## 2022-03-05 NOTE — Telephone Encounter (Signed)
Agree with this. Thanks.  

## 2022-03-05 NOTE — Telephone Encounter (Signed)
Home Health verbal orders Caller Name: amy  Agency Name: [facility] number: [phone]  Requesting OT/PT/Skilled nursing/Social Work/Speech: nursing   Reason:   Frequency: one week nine   Please forward to [facility] pool or providers CMA

## 2022-03-05 NOTE — Patient Outreach (Signed)
  Care Coordination   Follow Up Visit Note   03/05/2022 Name: [name] MRN: [id number] DOB: Dec 28, 1955  [name] is a 66 y.o. year old male who sees [name], MD for primary care. I spoke with  [name] Chamber Stampley by phone today.  What matters to the patients health and wellness today?  Patient states he utilized a transportation service for a provider appointment a few months ago but the driver did not show up on time. Patient unable to state name of transportation company used.  Per chart review patient contacted by [employer] guide and informed about [employer] ride share transportation benefit.  Patient states he would like to know which insurance company is better.   Patient states he is interested in getting an [employer] portable oxygen concentrator and an Clinical research associate.     Goals Addressed             This Visit's Progress    Patient states:  Care coordination follow up for durable medical equipment/ community resources       Care Coordination Interventions: Evaluation of current treatment plan related to health conditions and patient's adherence to plan as established by provider Reviewed medications with patient and discussed importance of compliance Reviewed scheduled/upcoming provider appointments  Discussed plans with patient for ongoing care management follow up and provided patient with direct contact information for care management team Patient provided contact number for [employer] care customer service.  Patient advised to call and request new medical care and inquire about transportation benefit.  Patient provided contact number for [facility] ride share Patient provided contact number to [facility] to discuss insurance options Patient advised to contact his oxygen carrier regarding [employer] portable oxygen Message sent to patients primary care provider regarding his interest in electric wheelchair.           SDOH assessments and interventions  completed:  Yes  SDOH Interventions Today    Flowsheet Row Most Recent Value  SDOH Interventions   Food Insecurity Interventions Intervention Not Indicated  Housing Interventions Intervention Not Indicated  Transportation Interventions Other (Comment)  [patient was referred to community resource guide 01/07/22. He was contacted and provided transportation resource information.]        Care Coordination Interventions Activated:  Yes  Care Coordination Interventions:  Yes, provided   Follow up plan: Follow up call scheduled for 03/26/22    Encounter Outcome:  Pt. Visit Completed   Quinn Plowman RN,BSN,CCM [place] [phone] direct line

## 2022-03-05 NOTE — Progress Notes (Signed)
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 210 -Denies hypoglycemic/hyperglycemic symptoms -Pt does not sleep well, breakfast is sometimes 2pm -Current medications: Metformin 1000 mg BID - Appropriate, Query Effective Glimepiride 4 mg daily -Appropriate, Query Effective Farxiga 5 mg - 2 tab daily -Appropriate, Query Effective One Touch Verio supplies -Medications previously tried: [place] (cost), Januvia  -Educated on A1c and blood sugar goals; Complications of diabetes including kidney damage, retinal damage, and cardiovascular disease; Benefits of routine self-monitoring of blood sugar; -Recommended to continue current medication; will coordinate with [employer] to ship  [place]  COPD (Goal: control symptoms and prevent exacerbations) -Controlled - 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; he has 2 inhalers remaining from [employer] -Re-established with pulmonology 12/2021 - ordered PFTs and sleep study and started BiPAP ininterim -Gold Grade: Gold 3 (FEV1 30-49%) -Current COPD Classification:  D (high sx, >/=2 exacerbations/yr) -Pulmonary function testing: Spirometry 05/2018: pre: FVC 54%, FEV1 51%, ratio 0.71 post: FVC 54%, FEV1 49%, ratio 0.69 -Exacerbations requiring treatment in last 6 months: 0 (last 07/2020) -Current treatment  Breztri 160-9-4.8 mcg/act 2 puff BID (PAP)- Appropriate, Effective, Safe, Accessible Atrovent (ipratropium) 2 puffs PRN - Appropriate, Effective, Safe, Accessible Albuterol HFA -Appropriate, Effective, Safe, Accessible Albuterol neb -Appropriate, Effective, Safe, Accessible Montelukast 10 mg daily HS -Appropriate, Effective, Safe, Accessible Flonase nasal spray -Appropriate, Effective, Safe, Accessible Oxygen -Medications previously tried: Trelegy, Advair, Spiriva -Patient reports consistent use of maintenance inhaler -Counseled on Proper inhaler technique; Benefits of consistent maintenance inhaler use -Recommend to continue current medication  Depression/Anxiety (Goal: manage symptoms) -Improved - recently switched from citalopram to fluoxetine, pt reports feeling a little better so far -Self medicating with marijuana - has switched from smoking to edibles recently -Reports more panic attacks lately, improved somewhat since switch to fluoxetine -PHQ9: 0 (12/2021) - minimal depression -GAD7: 8 (12/2021) - mild/moderate anxiety; hx panic attacks -Connected with PCP for mental health support -Current treatment: Fluoxetine 40 mg daily - Appropriate, Query Effective (too soon to tell) -Medications previously tried/failed: citalopram, hydroxyzine (ineffective) -Educated on Benefits of medication  for symptom control -Recommended to continue current medication  OSA/Insomnia -Uncontrolled  -Hx OSA on CPAP; he is noncompliant with CPAP due to uncomfortable mask, he has not tried new mask in a few years;  -Pt had to cancel sleep study due to transportation issues (car broke down); he did not have success with insurance transportation (they were late and he had to find another way to get to appt) -Advised to reschedule sleep study when able  Anemia  -Pt went to ER 10/31 for blood tranfusion, he received 2 units PRBC and was discharged home (pt refused admission), he was advised to follow up shortly with PCP -Current treatment  Vitamin B12 1000 mcg MWF -Medications previously tried: oral iron (GI upset)  -Per chart review, pt was meant to follow up with PCP 11/7 but cancelled appt due to transportation issues (car broke down), appt was rescheduled for 12/8,  however he may need repeat labs (following PRBC transfusion <2 weeks ago) sooner than that; will discuss with PCP and see if sooner appt is needed  Other: PT home health - helping with balance, gait Nursing with home health - checking for edema, weights  Patient Goals/Self-Care Activities Patient will:  - take medications as prescribed as evidenced by patient report and record review focus on medication adherence by routine check glucose daily, document, and provide at future appointments check blood pressure daily, document, and provide at future appointments     Medication Assistance:  [employer] - Judithann Sauger and [place] enrolled 2023  Compliance/Adherence/Medication fill history: Care Gaps: Foot, eye exams  Star-Rating Drugs: Glimepiride - PDC 96% Metformin - PDC 100%  Medication Access: Within the past 30 days, how often has patient missed a dose of medication? 0 Is a pillbox or other method used to improve adherence? Yes  Factors that may affect medication adherence? financial need Are meds synced by current pharmacy? No   Are meds delivered by current pharmacy? No  Does patient experience delays in picking up medications due to transportation concerns? No   [facility] Reviewed: Is patient disadvantaged to use [facility]?: No  Current Rx insurance plan: [facility] Name and location of Current pharmacy:  [id number] [place] [address]Phone: [phone]: [phone] [facility] services reviewed with patient today?: No  Patient requests to transfer care to [facility]?: No  Reason patient declined to change pharmacies: Not mentioned at this visit   Care Plan and Follow Up Patient Decision:  Patient agrees to Care Plan and Follow-up.  Plan: Telephone follow up appointment with care management team member scheduled for:  1 month  LCharlene Brooke PharmD, BNorman Regional Health System -Norman CampusClinical Pharmacist [facility]villePrimary [facility] at [phone]

## 2022-03-05 NOTE — Patient Outreach (Signed)
  Care Coordination   03/05/2022 Name: [name] MRN: [id number] DOB: 10-11-1955   Care Coordination Outreach Attempts:  An unsuccessful telephone outreach was attempted for a scheduled appointment today.  Follow Up Plan:  Additional outreach attempts will be made to offer the patient care coordination information and services.   Encounter Outcome:  No Answer  Care Coordination Interventions Activated:  No   Care Coordination Interventions:  No, not indicated    Quinn Plowman RN,BSN,CCM [place] [phone] direct line

## 2022-03-05 NOTE — Patient Instructions (Signed)
Visit Information  Phone number for Pharmacist: [phone]   Goals Addressed   None     Care Plan : [place]  Updates made by Charlton Haws, [facility] since 03/05/2022 12:00 AM     Problem: Hypertension, Hyperlipidemia, Diabetes, Atrial Fibrillation, Heart Failure, Coronary Artery Disease, and COPD   Priority: High     Long-Range Goal: Disease mgmt   Start Date: 10/23/2021  Expected End Date: 10/24/2022  Recent Progress: On track  Priority: High  Note:   Current Barriers:  Unable to independently afford treatment regimen Unable to maintain control of diabetes, heart failure, anemia  Pharmacist Clinical Goal(s):  Patient will verbalize ability to afford treatment regimen adhere to plan to optimize therapeutic regimen for diabetes as evidenced by report of adherence to recommended medication management changes through collaboration with PharmD and provider.   Interventions: 1:1 collaboration with [name], MD regarding development and update of comprehensive plan of care as evidenced by provider attestation and co-signature Inter-disciplinary care team collaboration (see longitudinal plan of care) Comprehensive medication review performed; medication list updated in electronic medical record  Hypertension / Heart Failure (BP goal <130/80) -Improved - pt reports doing reasonable well recently; his home weights appear stable, he is taking medication as prescribed -Last ejection fraction: 60-65% (Date: 10/2021) -HF type: HFpEF (EF > 50%); NYHA Class: II/III -AHA HF Stage: C (Heart disease and symptoms present) -Follows with cardiology (Dr Nehemiah Massed, Dr Quentin Ore) and HF clinic (NP Darylene Price) -Daily weights this week (lbs): 258, 260, 259, 262, 259 -Current treatment: Carvedilol 6.25 mg BID - Appropriate, Effective, Safe, Accessible Furosemide 80 mg BID - Appropriate, Effective, Safe, Accessible Metolazone 2.5 mg twice weekly (Wed/Sat) -Appropriate,  Effective, Safe, Accessible Potassium chloride  10 mEq daily + extra tab with metolazone - Appropriate, Effective, Safe, Accessible Farxiga 5 mg - 2 tab daily - Appropriate, Effective, Safe, Accessible -Medications previously tried: losartan, metoprolol, torsemide -Reviewed importance of weighing daily to monitor fluid status; advised to contact cardiology with wt gain of 3+ lbs overnight or 5+ lbs in a week -Recommended to continue current medication  Atrial Fibrillation (Goal: prevent stroke and major bleeding) -Query Controlled - -pt recently switched from Eliquis to warfarin due to cost issues; he has been following closely with PCP warfarin clinic and INR has been fluctuating, previously >12 requiring vitamin K; also went to ED 10/31 for blood transfusions due to symptomatic anemia, no signs of active bleeding at that time -Amiodarone was reduced to 200 mg daily 08/2021 per Dr Quentin Ore; per rx fill history it has been filled for original BID dose several times since then; pt affirms he has been taking it just once daily -Dx 04/2021; Cardioversion 07/2021; CHADSVASC: 5 -Follows with cardiology (Dr Nehemiah Massed, Dr Quentin Ore) -Current treatment: Carvedilol 6.25 mg BID - Appropriate, Effective, Safe, Accessible Amiodarone 200 mg daily -Appropriate, Effective, Safe, Accessible Warfarin 5 mg - 1/2 tab daily except 1 tab M/Th -Appropriate, Effective, Query Safe -Medications previously tried: Eliquis (cost) -Reviewed long term safety of warfarin - pt has struggled with getting INR in therapeutic range, with frequent fluid overload in s/o of acute HF; pt would probably do better with DOAC but could not afford Eliquis in donut hole -Recommended to continue current medication; re-evaluate possibility of DOAC in 2024  Hyperlipidemia / CAD (LDL goal < 70) -Controlled - LDL 50 (12/2021) at goal -Hx CAD (3v disease on CT scan); also AAA; no aspirin due to warfarin -Current treatment: Simvastatin 20 mg daily HS  -  Appropriate, Effective, Safe, Accessible -Medications previously tried: n/a  -Educated on Cholesterol goals; Benefits of statin for ASCVD risk reduction; -Recommended to continue current medication  Diabetes (A1c goal <8%) -Uncontrolled, improving - A1c 8.6% (12/2021); he has started watching his diet and home BG appears improved; he is on Farxiga 5 mg and dose was recently increase to 10 mg, originally send to pharmacy instead of PAP but this has been corrected, he will receive shipment of 10 mg within a few days -Current home glucose readings fasting glucose: 156 - 210 -Denies hypoglycemic/hyperglycemic symptoms -Pt does not sleep well, breakfast is sometimes 2pm -Current medications: Metformin 1000 mg BID - Appropriate, Query Effective Glimepiride 4 mg daily -Appropriate, Query Effective Farxiga 5 mg - 2 tab daily -Appropriate, Query Effective One Touch [employer] supplies -Medications previously tried: [place] (cost), Januvia  -Educated on A1c and blood sugar goals; Complications of diabetes including kidney damage, retinal damage, and cardiovascular disease; Benefits of routine self-monitoring of blood sugar; -Recommended to continue current medication; will coordinate with [employer] to ship [place]  COPD (Goal: control symptoms and prevent exacerbations) -Controlled - 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; he has 2 inhalers remaining from [employer] -Re-established with pulmonology 12/2021 - ordered PFTs and sleep study and started BiPAP ininterim -Gold Grade: Gold 3 (FEV1 30-49%) -Current COPD Classification:  D (high sx, >/=2 exacerbations/yr) -Pulmonary function testing: Spirometry 05/2018: pre: FVC 54%, FEV1 51%, ratio 0.71 post: FVC 54%, FEV1 49%, ratio 0.69 -Exacerbations requiring treatment in last 6 months: 0 (last 07/2020) -Current treatment  Breztri 160-9-4.8 mcg/act 2 puff BID (PAP)- Appropriate, Effective, Safe, Accessible Atrovent (ipratropium)  2 puffs PRN - Appropriate, Effective, Safe, Accessible Albuterol HFA -Appropriate, Effective, Safe, Accessible Albuterol neb -Appropriate, Effective, Safe, Accessible Montelukast 10 mg daily HS -Appropriate, Effective, Safe, Accessible Flonase nasal spray -Appropriate, Effective, Safe, Accessible Oxygen -Medications previously tried: Trelegy, Advair, Spiriva -Patient reports consistent use of maintenance inhaler -Counseled on Proper inhaler technique; Benefits of consistent maintenance inhaler use -Recommend to continue current medication  Depression/Anxiety (Goal: manage symptoms) -Improved - recently switched from citalopram to fluoxetine, pt reports feeling a little better so far -Self medicating with marijuana - has switched from smoking to edibles recently -Reports more panic attacks lately, improved somewhat since switch to fluoxetine -PHQ9: 0 (12/2021) - minimal depression -GAD7: 8 (12/2021) - mild/moderate anxiety; hx panic attacks -Connected with PCP for mental health support -Current treatment: Fluoxetine 40 mg daily - Appropriate, Query Effective (too soon to tell) -Medications previously tried/failed: citalopram, hydroxyzine (ineffective) -Educated on Benefits of medication for symptom control -Recommended to continue current medication  OSA/Insomnia -Uncontrolled  -Hx OSA on CPAP; he is noncompliant with CPAP due to uncomfortable mask, he has not tried new mask in a few years;  -Pt had to cancel sleep study due to transportation issues (car broke down); he did not have success with insurance transportation (they were late and he had to find another way to get to appt) -Advised to reschedule sleep study when able  Anemia  -Pt went to ER 10/31 for blood tranfusion, he received 2 units PRBC and was discharged home (pt refused admission), he was advised to follow up shortly with PCP -Current treatment  Vitamin B12 1000 mcg MWF -Medications previously tried: oral iron (GI  upset)  -Per chart review, pt was meant to follow up with PCP 11/7 but cancelled appt due to transportation issues (car broke down), appt was rescheduled for 12/8, however he may need  repeat labs (following PRBC transfusion <2 weeks ago) sooner than that; will discuss with PCP and see if sooner appt is needed  Other: PT home health - helping with balance, gait Nursing with home health - checking for edema, weights  Patient Goals/Self-Care Activities Patient will:  - take medications as prescribed as evidenced by patient report and record review focus on medication adherence by routine check glucose daily, document, and provide at future appointments check blood pressure daily, document, and provide at future appointments      Patient verbalizes understanding of instructions and care plan provided today and agrees to view in [place]. Active MyChart status and patient understanding of how to access instructions and care plan via MyChart confirmed with patient.    Telephone follow up appointment with pharmacy team member scheduled for: 1 month  [name], PharmD, [facility] Clinical Pharmacist [place] [facility] at [facility] [phone]

## 2022-03-07 DIAGNOSIS — F32A Depression, unspecified: Secondary | ICD-10-CM | POA: Diagnosis not present

## 2022-03-07 DIAGNOSIS — I872 Venous insufficiency (chronic) (peripheral): Secondary | ICD-10-CM | POA: Diagnosis not present

## 2022-03-07 DIAGNOSIS — M1990 Unspecified osteoarthritis, unspecified site: Secondary | ICD-10-CM | POA: Diagnosis not present

## 2022-03-07 DIAGNOSIS — I420 Dilated cardiomyopathy: Secondary | ICD-10-CM | POA: Diagnosis not present

## 2022-03-07 DIAGNOSIS — I252 Old myocardial infarction: Secondary | ICD-10-CM | POA: Diagnosis not present

## 2022-03-07 DIAGNOSIS — I4891 Unspecified atrial fibrillation: Secondary | ICD-10-CM | POA: Diagnosis not present

## 2022-03-07 DIAGNOSIS — I110 Hypertensive heart disease with heart failure: Secondary | ICD-10-CM | POA: Diagnosis not present

## 2022-03-07 DIAGNOSIS — J441 Chronic obstructive pulmonary disease with (acute) exacerbation: Secondary | ICD-10-CM | POA: Diagnosis not present

## 2022-03-07 DIAGNOSIS — G43909 Migraine, unspecified, not intractable, without status migrainosus: Secondary | ICD-10-CM | POA: Diagnosis not present

## 2022-03-07 DIAGNOSIS — K219 Gastro-esophageal reflux disease without esophagitis: Secondary | ICD-10-CM | POA: Diagnosis not present

## 2022-03-07 DIAGNOSIS — I25119 Atherosclerotic heart disease of native coronary artery with unspecified angina pectoris: Secondary | ICD-10-CM | POA: Diagnosis not present

## 2022-03-07 DIAGNOSIS — L03116 Cellulitis of left lower limb: Secondary | ICD-10-CM | POA: Diagnosis not present

## 2022-03-07 DIAGNOSIS — E876 Hypokalemia: Secondary | ICD-10-CM | POA: Diagnosis not present

## 2022-03-07 DIAGNOSIS — G4733 Obstructive sleep apnea (adult) (pediatric): Secondary | ICD-10-CM | POA: Diagnosis not present

## 2022-03-07 DIAGNOSIS — L97229 Non-pressure chronic ulcer of left calf with unspecified severity: Secondary | ICD-10-CM | POA: Diagnosis not present

## 2022-03-07 DIAGNOSIS — E785 Hyperlipidemia, unspecified: Secondary | ICD-10-CM | POA: Diagnosis not present

## 2022-03-07 DIAGNOSIS — L97829 Non-pressure chronic ulcer of other part of left lower leg with unspecified severity: Secondary | ICD-10-CM | POA: Diagnosis not present

## 2022-03-07 DIAGNOSIS — I5022 Chronic systolic (congestive) heart failure: Secondary | ICD-10-CM | POA: Diagnosis not present

## 2022-03-07 DIAGNOSIS — Z9181 History of falling: Secondary | ICD-10-CM | POA: Diagnosis not present

## 2022-03-07 DIAGNOSIS — E1165 Type 2 diabetes mellitus with hyperglycemia: Secondary | ICD-10-CM | POA: Diagnosis not present

## 2022-03-07 DIAGNOSIS — L03115 Cellulitis of right lower limb: Secondary | ICD-10-CM | POA: Diagnosis not present

## 2022-03-07 DIAGNOSIS — Z5181 Encounter for therapeutic drug level monitoring: Secondary | ICD-10-CM | POA: Diagnosis not present

## 2022-03-07 DIAGNOSIS — I5033 Acute on chronic diastolic (congestive) heart failure: Secondary | ICD-10-CM | POA: Diagnosis not present

## 2022-03-08 ENCOUNTER — Other Ambulatory Visit: Payer: Self-pay | Admitting: Family Medicine

## 2022-03-08 ENCOUNTER — Telehealth: Payer: Self-pay

## 2022-03-08 ENCOUNTER — Telehealth: Payer: Self-pay | Admitting: Family Medicine

## 2022-03-08 DIAGNOSIS — D649 Anemia, unspecified: Secondary | ICD-10-CM

## 2022-03-08 NOTE — Telephone Encounter (Signed)
[name] with home health given verbal orders by telephone as instructed.

## 2022-03-08 NOTE — Telephone Encounter (Addendum)
Lvm asking pt to call back.  Needs to schedule ER f/u OV now but will keep 04/02/22 OV for mobility assessment for power wheelchair.

## 2022-03-08 NOTE — Telephone Encounter (Signed)
Spoke to patient by telephone and appointment scheduled for 03/15/22 for an ER follow-up. Patient stated that he hopes that his car is repaired by that time.

## 2022-03-08 NOTE — Telephone Encounter (Signed)
Agree with these recommendations. See other phone note regarding office visit for mobility assessment.

## 2022-03-08 NOTE — Telephone Encounter (Signed)
Cecilia with Home Health given verbal orders per Dr. Danise Mina. Lorna Few was given information on upcoming appointment for patient regarding the power wheelchair.

## 2022-03-08 NOTE — Telephone Encounter (Signed)
Home Health verbal orders Caller Name: [employer] Name: Joline Maxcy Rogers number: [phone]  Requesting PT  Frequency: 1x a week for 1 week/ 0x a week for 1 week (Thanksgiving week)/ 1x a week for 4 weeks/ 1x a week for 2 weeks  Comment: Need an order for a power wheelchair due to not been able to walk far.   Please forward to [facility] pool or providers CMA b

## 2022-03-10 DIAGNOSIS — E876 Hypokalemia: Secondary | ICD-10-CM | POA: Diagnosis not present

## 2022-03-10 DIAGNOSIS — Z9981 Dependence on supplemental oxygen: Secondary | ICD-10-CM | POA: Diagnosis not present

## 2022-03-10 DIAGNOSIS — G4733 Obstructive sleep apnea (adult) (pediatric): Secondary | ICD-10-CM | POA: Diagnosis not present

## 2022-03-10 DIAGNOSIS — I420 Dilated cardiomyopathy: Secondary | ICD-10-CM | POA: Diagnosis not present

## 2022-03-10 DIAGNOSIS — E1165 Type 2 diabetes mellitus with hyperglycemia: Secondary | ICD-10-CM | POA: Diagnosis not present

## 2022-03-10 DIAGNOSIS — Z7901 Long term (current) use of anticoagulants: Secondary | ICD-10-CM | POA: Diagnosis not present

## 2022-03-10 DIAGNOSIS — F32A Depression, unspecified: Secondary | ICD-10-CM | POA: Diagnosis not present

## 2022-03-10 DIAGNOSIS — G43909 Migraine, unspecified, not intractable, without status migrainosus: Secondary | ICD-10-CM | POA: Diagnosis not present

## 2022-03-10 DIAGNOSIS — I110 Hypertensive heart disease with heart failure: Secondary | ICD-10-CM | POA: Diagnosis not present

## 2022-03-10 DIAGNOSIS — Z87891 Personal history of nicotine dependence: Secondary | ICD-10-CM | POA: Diagnosis not present

## 2022-03-10 DIAGNOSIS — I872 Venous insufficiency (chronic) (peripheral): Secondary | ICD-10-CM | POA: Diagnosis not present

## 2022-03-10 DIAGNOSIS — M1990 Unspecified osteoarthritis, unspecified site: Secondary | ICD-10-CM | POA: Diagnosis not present

## 2022-03-10 DIAGNOSIS — K219 Gastro-esophageal reflux disease without esophagitis: Secondary | ICD-10-CM | POA: Diagnosis not present

## 2022-03-10 DIAGNOSIS — J441 Chronic obstructive pulmonary disease with (acute) exacerbation: Secondary | ICD-10-CM | POA: Diagnosis not present

## 2022-03-10 DIAGNOSIS — I25119 Atherosclerotic heart disease of native coronary artery with unspecified angina pectoris: Secondary | ICD-10-CM | POA: Diagnosis not present

## 2022-03-10 DIAGNOSIS — Z5181 Encounter for therapeutic drug level monitoring: Secondary | ICD-10-CM | POA: Diagnosis not present

## 2022-03-10 DIAGNOSIS — Z9181 History of falling: Secondary | ICD-10-CM | POA: Diagnosis not present

## 2022-03-10 DIAGNOSIS — I252 Old myocardial infarction: Secondary | ICD-10-CM | POA: Diagnosis not present

## 2022-03-10 DIAGNOSIS — Z7984 Long term (current) use of oral hypoglycemic drugs: Secondary | ICD-10-CM | POA: Diagnosis not present

## 2022-03-10 DIAGNOSIS — E785 Hyperlipidemia, unspecified: Secondary | ICD-10-CM | POA: Diagnosis not present

## 2022-03-10 DIAGNOSIS — I4891 Unspecified atrial fibrillation: Secondary | ICD-10-CM | POA: Diagnosis not present

## 2022-03-10 DIAGNOSIS — I5042 Chronic combined systolic (congestive) and diastolic (congestive) heart failure: Secondary | ICD-10-CM | POA: Diagnosis not present

## 2022-03-11 ENCOUNTER — Ambulatory Visit (INDEPENDENT_AMBULATORY_CARE_PROVIDER_SITE_OTHER): Payer: Medicare Other

## 2022-03-11 DIAGNOSIS — Z7901 Long term (current) use of anticoagulants: Secondary | ICD-10-CM

## 2022-03-11 DIAGNOSIS — Z9981 Dependence on supplemental oxygen: Secondary | ICD-10-CM | POA: Diagnosis not present

## 2022-03-11 DIAGNOSIS — I5042 Chronic combined systolic (congestive) and diastolic (congestive) heart failure: Secondary | ICD-10-CM | POA: Diagnosis not present

## 2022-03-11 DIAGNOSIS — E785 Hyperlipidemia, unspecified: Secondary | ICD-10-CM | POA: Diagnosis not present

## 2022-03-11 DIAGNOSIS — E876 Hypokalemia: Secondary | ICD-10-CM | POA: Diagnosis not present

## 2022-03-11 DIAGNOSIS — Z9181 History of falling: Secondary | ICD-10-CM | POA: Diagnosis not present

## 2022-03-11 DIAGNOSIS — I25119 Atherosclerotic heart disease of native coronary artery with unspecified angina pectoris: Secondary | ICD-10-CM | POA: Diagnosis not present

## 2022-03-11 DIAGNOSIS — G43909 Migraine, unspecified, not intractable, without status migrainosus: Secondary | ICD-10-CM | POA: Diagnosis not present

## 2022-03-11 DIAGNOSIS — I872 Venous insufficiency (chronic) (peripheral): Secondary | ICD-10-CM | POA: Diagnosis not present

## 2022-03-11 DIAGNOSIS — I420 Dilated cardiomyopathy: Secondary | ICD-10-CM | POA: Diagnosis not present

## 2022-03-11 DIAGNOSIS — E1165 Type 2 diabetes mellitus with hyperglycemia: Secondary | ICD-10-CM | POA: Diagnosis not present

## 2022-03-11 DIAGNOSIS — Z7984 Long term (current) use of oral hypoglycemic drugs: Secondary | ICD-10-CM | POA: Diagnosis not present

## 2022-03-11 DIAGNOSIS — I110 Hypertensive heart disease with heart failure: Secondary | ICD-10-CM | POA: Diagnosis not present

## 2022-03-11 DIAGNOSIS — G4733 Obstructive sleep apnea (adult) (pediatric): Secondary | ICD-10-CM | POA: Diagnosis not present

## 2022-03-11 DIAGNOSIS — Z87891 Personal history of nicotine dependence: Secondary | ICD-10-CM | POA: Diagnosis not present

## 2022-03-11 DIAGNOSIS — J441 Chronic obstructive pulmonary disease with (acute) exacerbation: Secondary | ICD-10-CM | POA: Diagnosis not present

## 2022-03-11 DIAGNOSIS — M1990 Unspecified osteoarthritis, unspecified site: Secondary | ICD-10-CM | POA: Diagnosis not present

## 2022-03-11 DIAGNOSIS — Z5181 Encounter for therapeutic drug level monitoring: Secondary | ICD-10-CM | POA: Diagnosis not present

## 2022-03-11 DIAGNOSIS — I252 Old myocardial infarction: Secondary | ICD-10-CM | POA: Diagnosis not present

## 2022-03-11 DIAGNOSIS — I4891 Unspecified atrial fibrillation: Secondary | ICD-10-CM | POA: Diagnosis not present

## 2022-03-11 DIAGNOSIS — F32A Depression, unspecified: Secondary | ICD-10-CM | POA: Diagnosis not present

## 2022-03-11 DIAGNOSIS — K219 Gastro-esophageal reflux disease without esophagitis: Secondary | ICD-10-CM | POA: Diagnosis not present

## 2022-03-11 LAB — POCT INR: INR: 1.3 — AB (ref 2.0–3.0)

## 2022-03-11 NOTE — Progress Notes (Signed)
[name] from [facility] called with INR result of 1.3.  Increase dose today to one 5 mg tablet and increase dose tomorrow to one 5 mg tablet.  Then continue to take 1/2 tablet (2.5 mg) once a day except take no warfarin on Sundays or Wednesdays. Recheck in 1 week. Gave above instructions to pt and [name], both verbalize understanding.

## 2022-03-11 NOTE — Patient Instructions (Signed)
Increase dose today to one 5 mg tablet and increase dose tomorrow to one 5 mg tablet.  Then continue to take 1/2 tablet (2.5 mg) once a day except take no warfarin on Sundays or Wednesdays. Recheck in 1 week, on 11/22.

## 2022-03-12 DIAGNOSIS — Z6841 Body Mass Index (BMI) 40.0 and over, adult: Secondary | ICD-10-CM

## 2022-03-12 DIAGNOSIS — Z87891 Personal history of nicotine dependence: Secondary | ICD-10-CM

## 2022-03-12 DIAGNOSIS — E1165 Type 2 diabetes mellitus with hyperglycemia: Secondary | ICD-10-CM | POA: Diagnosis not present

## 2022-03-12 DIAGNOSIS — G4733 Obstructive sleep apnea (adult) (pediatric): Secondary | ICD-10-CM

## 2022-03-12 DIAGNOSIS — G43909 Migraine, unspecified, not intractable, without status migrainosus: Secondary | ICD-10-CM

## 2022-03-12 DIAGNOSIS — I25119 Atherosclerotic heart disease of native coronary artery with unspecified angina pectoris: Secondary | ICD-10-CM | POA: Diagnosis not present

## 2022-03-12 DIAGNOSIS — F419 Anxiety disorder, unspecified: Secondary | ICD-10-CM

## 2022-03-12 DIAGNOSIS — M1990 Unspecified osteoarthritis, unspecified site: Secondary | ICD-10-CM | POA: Diagnosis not present

## 2022-03-12 DIAGNOSIS — I4891 Unspecified atrial fibrillation: Secondary | ICD-10-CM | POA: Diagnosis not present

## 2022-03-12 DIAGNOSIS — I5042 Chronic combined systolic (congestive) and diastolic (congestive) heart failure: Secondary | ICD-10-CM | POA: Diagnosis not present

## 2022-03-12 DIAGNOSIS — E876 Hypokalemia: Secondary | ICD-10-CM | POA: Diagnosis not present

## 2022-03-12 DIAGNOSIS — K219 Gastro-esophageal reflux disease without esophagitis: Secondary | ICD-10-CM | POA: Diagnosis not present

## 2022-03-12 DIAGNOSIS — I872 Venous insufficiency (chronic) (peripheral): Secondary | ICD-10-CM | POA: Diagnosis not present

## 2022-03-12 DIAGNOSIS — F32A Depression, unspecified: Secondary | ICD-10-CM

## 2022-03-12 DIAGNOSIS — I252 Old myocardial infarction: Secondary | ICD-10-CM

## 2022-03-12 DIAGNOSIS — I110 Hypertensive heart disease with heart failure: Secondary | ICD-10-CM | POA: Diagnosis not present

## 2022-03-12 DIAGNOSIS — Z7901 Long term (current) use of anticoagulants: Secondary | ICD-10-CM

## 2022-03-12 DIAGNOSIS — J441 Chronic obstructive pulmonary disease with (acute) exacerbation: Secondary | ICD-10-CM | POA: Diagnosis not present

## 2022-03-12 DIAGNOSIS — Z5181 Encounter for therapeutic drug level monitoring: Secondary | ICD-10-CM

## 2022-03-12 DIAGNOSIS — Z7984 Long term (current) use of oral hypoglycemic drugs: Secondary | ICD-10-CM

## 2022-03-12 DIAGNOSIS — E785 Hyperlipidemia, unspecified: Secondary | ICD-10-CM | POA: Diagnosis not present

## 2022-03-12 DIAGNOSIS — Z9981 Dependence on supplemental oxygen: Secondary | ICD-10-CM

## 2022-03-12 DIAGNOSIS — I420 Dilated cardiomyopathy: Secondary | ICD-10-CM | POA: Diagnosis not present

## 2022-03-12 DIAGNOSIS — Z9181 History of falling: Secondary | ICD-10-CM

## 2022-03-15 ENCOUNTER — Encounter: Payer: Self-pay | Admitting: Family Medicine

## 2022-03-15 ENCOUNTER — Telehealth: Payer: Self-pay | Admitting: Family Medicine

## 2022-03-15 ENCOUNTER — Ambulatory Visit: Payer: Medicare Other | Admitting: Family Medicine

## 2022-03-15 DIAGNOSIS — Z7901 Long term (current) use of anticoagulants: Secondary | ICD-10-CM

## 2022-03-15 DIAGNOSIS — D649 Anemia, unspecified: Secondary | ICD-10-CM

## 2022-03-15 DIAGNOSIS — K633 Ulcer of intestine: Secondary | ICD-10-CM

## 2022-03-15 NOTE — Progress Notes (Deleted)
Patient ID: [name], male    DOB: 09-Feb-1956, 66 y.o.   MRN: [id number]  This visit was conducted in person.  There were no vitals taken for this visit.   CC: ER f/u visit  Subjective:   HPI: [name] is a 66 y.o. male presenting on 03/15/2022 for No chief complaint on file.   See prior note for details. Seen here 02/19/2022 with worsening dyspnea, found to be markedly anemic in setting of supratherapeutic INR the prior month. Subsequently evaluated at the ER on 02/23/2022 where he received 2 pRBC transfusions, tolerated well.   Difficulty tolerating oral iron due to constipation.   [facility] now involved.   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 Continues continual supplemental oxygen at 2L via [place].   Followed by our Chronic Care Management team.  Has recently had some transportation issues, has been referred to [employer] guide.  He is interested in Nature conservation officer wheelchair. Has mobility assessment scheduled for early December.  Rx for In[employer] portable oxygen concentrated written out last month.   S/p EGD/colonoscopy 04/2020 [name]) showing descending colon ulcer - plan at that time was proceed with capsule endoscopy however appt was missed due to scheduling confusion.   Established with heme/onc [name] 09/2021 s/p 4 iron infusions with feraheme, latest 10/30/2021. Again plan was f/u with GI but not done.       Relevant past medical, surgical, family and social history reviewed and updated as indicated. Interim medical history since our last visit reviewed. Allergies and medications reviewed and updated. Outpatient Medications Prior to Visit  Medication Sig Dispense Refill   ACCU-CHEK FASTCLIX LANCETS MISC Check blood sugar once daily and as instructed. Dx 250.00 100 each 3   albuterol (VENTOLIN HFA) 108 (90 Base) MCG/ACT inhaler Inhale 2 puffs into the lungs every 6  (six) hours as needed for wheezing or shortness of breath. 8.5 each 6   amiodarone (PACERONE) 200 MG tablet Take 1 tablet (200 mg total) by mouth daily. 90 tablet 3   Blood Glucose Monitoring Suppl (BLOOD GLUCOSE MONITOR SYSTEM) W/DEVICE KIT by Does not apply route. Use to check sugar once daily and as needed Dx: E11.9 **ONE TOUCH VERIO**     Budeson-Glycopyrrol-Formoterol (BREZTRI AEROSPHERE) 160-9-4.8 MCG/ACT AERO Inhale 2 puffs into the lungs 2 (two) times daily. 32.1 g 3   carvedilol (COREG) 6.25 MG tablet TAKE 1 TABLET BY MOUTH TWICE A DAY WITH FOOD 180 tablet 0   Cholecalciferol (VITAMIN D) 50 MCG (2000 UT) CAPS Take 1 capsule (2,000 Units total) by mouth daily. 30 capsule    dapagliflozin propanediol (FARXIGA) 10 MG TABS tablet Take 1 tablet (10 mg total) by mouth daily before breakfast. 90 tablet 3   Docusate Calcium (STOOL SOFTENER PO) Take 1 tablet by mouth daily.     FLUoxetine (PROZAC) 40 MG capsule Take 1 capsule (40 mg total) by mouth daily. 30 capsule 6   fluticasone (FLONASE) 50 MCG/ACT nasal spray Place 2 sprays into both nostrils daily. 48 mL 3   furosemide (LASIX) 80 MG tablet Take 80 mg by mouth 2 (two) times daily.     glimepiride (AMARYL) 4 MG tablet Take 1 tablet (4 mg total) by mouth daily with breakfast. 90 tablet 1   glucose blood (ONETOUCH VERIO) test strip Check blood sugar 3 times a day 300 strip 3   ipratropium-albuterol (DUONEB) 0.5-2.5 (3) MG/3ML SOLN Take 3 mLs  by nebulization every 6 (six) hours as needed (severe shortness of breath/wheezing). 360 mL 0   metFORMIN (GLUCOPHAGE) 1000 MG tablet Take 1 tablet (1,000 mg total) by mouth 2 (two) times daily with a meal. 180 tablet 1   metolazone (ZAROXOLYN) 2.5 MG tablet Take 1 tablet (2.5 mg total) by mouth 2 (two) times a week. Saturday and wednesday 8 tablet 5   montelukast (SINGULAIR) 10 MG tablet TAKE 1 TABLET BY MOUTH EVERYDAY AT BEDTIME 90 tablet 3   pantoprazole (PROTONIX) 40 MG tablet TAKE 1 TABLET (40 MG TOTAL)  BY MOUTH TWICE A DAY BEFORE MEALS 180 tablet 0   potassium chloride (KLOR-CON M) 10 MEQ tablet Take 1 tablet (10 mEq total) by mouth daily. And an extra one on the days you take metolazone 38 tablet 5   Probiotic Product (PROBIOTIC DAILY PO) Take 1 tablet by mouth daily.     simvastatin (ZOCOR) 20 MG tablet TAKE 1 TABLET BY MOUTH EVERY DAY IN THE EVENING 90 tablet 0   torsemide (DEMADEX) 10 MG tablet Take 10 mg by mouth daily. (Patient not taking: Reported on 03/05/2022)     Turmeric (QC TUMERIC COMPLEX PO) Take by mouth.     vitamin B-12 (CYANOCOBALAMIN) 1000 MCG tablet Take 1 tablet (1,000 mcg total) by mouth every Monday, Wednesday, and Friday.     warfarin (COUMADIN) 5 MG tablet TAKE 1/2 TABLET BY MOUTH DAILY EXCEPT TAKE NOTHING ON WEDNESDAYS OR AS DIRECTED BY ANTICOAGULATION CLINIC (Patient taking differently: TAKE 1/2 TABLET BY MOUTH DAILY EXCEPT TAKE NOTHING ON WEDNESDAYS OR SATURDAY AS DIRECTED BY ANTICOAGULATION CLINIC) 60 tablet 1   No facility-administered medications prior to visit.     Per HPI unless specifically indicated in ROS section below Review of Systems  Objective:  There were no vitals taken for this visit.  Wt Readings from Last 3 Encounters:  02/25/22 264 lb (119.7 kg)  02/22/22 262 lb (118.8 kg)  02/19/22 262 lb 8 oz (119.1 kg)      Physical Exam    Results for orders placed or performed in visit on 03/11/22  POCT INR  Result Value Ref Range   INR 1.3 (A) 2.0 - 3.0   Lab Results  Component Value Date   CREATININE 0.72 02/22/2022   BUN 15 02/22/2022   NA 140 02/22/2022   K 3.4 (L) 02/22/2022   CL 96 (L) 02/22/2022   CO2 35 (H) 02/22/2022    Lab Results  Component Value Date   WBC 9.5 02/23/2022   HGB 7.6 (L) 02/23/2022   HCT 27.7 (L) 02/23/2022   MCV 75.7 (L) 02/23/2022   PLT 245 02/23/2022    Lab Results  Component Value Date   IRON 14 (L) 02/23/2022   TIBC 435 02/23/2022   FERRITIN 6 (L) 02/23/2022   Assessment & Plan:   Problem List  Items Addressed This Visit     Symptomatic anemia - Primary    See prior note for details. Seen here 02/19/2022 with worsening dyspnea, found to be markedly anemic in setting of supratherapeutic INR the prior month. Subsequently evaluated at the ER on 02/23/2022 where he received 2 pRBC transfusions, tolerated well.   Difficulty tolerating oral iron due to constipation.   [facility] now involved.   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 Continues continual supplemental oxygen at 2L via [place].   Followed by our Chronic Care Management team.  Has recently had some transportation issues, has been  referred to [employer] guide.  He is interested in Nature conservation officer wheelchair. Has mobility assessment scheduled for early December.  Rx for Inogen portable oxygen concentrated written out last month.   S/p EGD/colonoscopy 04/2020 [name]) showing descending colon ulcer - plan at that time was proceed with capsule endoscopy however appt was missed due to scheduling confusion.   Established with heme/onc [name] 09/2021 s/p 4 iron infusions with feraheme, latest 10/30/2021. Again plan was f/u with GI but not done.         No orders of the defined types were placed in this encounter.  No orders of the defined types were placed in this encounter.    ***Follow up plan: No follow-ups on file.  [name], MD

## 2022-03-15 NOTE — Telephone Encounter (Signed)
Called and spoke with patient he wasn't able come in today because he was having issues with his car. He rescheduled his appt to the 03/23/22.per Dr. Darnell Level he is okay come to have his labs and appts on the same day. Ask pt call us back if he is not come for his appt so we can get him r/s.

## 2022-03-15 NOTE — Assessment & Plan Note (Deleted)
See prior note for details. Seen here 02/19/2022 with worsening dyspnea, found to be markedly anemic in setting of supratherapeutic INR the prior month. Subsequently evaluated at the ER on 02/23/2022 where he received 2 pRBC transfusions, tolerated well.   Difficulty tolerating oral iron due to constipation.   [facility] now involved.   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 Continues continual supplemental oxygen at 2L via [place].   Followed by our Chronic Care Management team.  Has recently had some transportation issues, has been referred to [employer] guide.  He is interested in Nature conservation officer wheelchair. Has mobility assessment scheduled for early December.  Rx for Inogen portable oxygen concentrated written out last month.   S/p EGD/colonoscopy 04/2020 Allen Norris) showing descending colon ulcer - plan at that time was proceed with capsule endoscopy however appt was missed due to scheduling confusion.   Established with heme/onc Dr Janese Banks 09/2021 s/p 4 iron infusions with feraheme, latest 10/30/2021. Again plan was f/u with GI but not done.

## 2022-03-15 NOTE — Telephone Encounter (Signed)
Pt missed appt today.  Plz call - he needs to come in for labwork after 2 blood transfusions at ER 02/23/2022.  Also recommend return to GI for further evaluation of anemia.  Referral placed back to  GI.

## 2022-03-16 DIAGNOSIS — J9601 Acute respiratory failure with hypoxia: Secondary | ICD-10-CM | POA: Diagnosis not present

## 2022-03-16 DIAGNOSIS — G4733 Obstructive sleep apnea (adult) (pediatric): Secondary | ICD-10-CM | POA: Diagnosis not present

## 2022-03-17 ENCOUNTER — Ambulatory Visit (INDEPENDENT_AMBULATORY_CARE_PROVIDER_SITE_OTHER): Payer: Medicare Other

## 2022-03-17 ENCOUNTER — Ambulatory Visit: Payer: Medicare Other | Admitting: Cardiology

## 2022-03-17 DIAGNOSIS — Z7984 Long term (current) use of oral hypoglycemic drugs: Secondary | ICD-10-CM | POA: Diagnosis not present

## 2022-03-17 DIAGNOSIS — I5042 Chronic combined systolic (congestive) and diastolic (congestive) heart failure: Secondary | ICD-10-CM | POA: Diagnosis not present

## 2022-03-17 DIAGNOSIS — G4733 Obstructive sleep apnea (adult) (pediatric): Secondary | ICD-10-CM | POA: Diagnosis not present

## 2022-03-17 DIAGNOSIS — I4891 Unspecified atrial fibrillation: Secondary | ICD-10-CM | POA: Diagnosis not present

## 2022-03-17 DIAGNOSIS — Z9981 Dependence on supplemental oxygen: Secondary | ICD-10-CM | POA: Diagnosis not present

## 2022-03-17 DIAGNOSIS — Z9181 History of falling: Secondary | ICD-10-CM | POA: Diagnosis not present

## 2022-03-17 DIAGNOSIS — M1990 Unspecified osteoarthritis, unspecified site: Secondary | ICD-10-CM | POA: Diagnosis not present

## 2022-03-17 DIAGNOSIS — E876 Hypokalemia: Secondary | ICD-10-CM | POA: Diagnosis not present

## 2022-03-17 DIAGNOSIS — I252 Old myocardial infarction: Secondary | ICD-10-CM | POA: Diagnosis not present

## 2022-03-17 DIAGNOSIS — F32A Depression, unspecified: Secondary | ICD-10-CM | POA: Diagnosis not present

## 2022-03-17 DIAGNOSIS — Z7901 Long term (current) use of anticoagulants: Secondary | ICD-10-CM | POA: Diagnosis not present

## 2022-03-17 DIAGNOSIS — E785 Hyperlipidemia, unspecified: Secondary | ICD-10-CM | POA: Diagnosis not present

## 2022-03-17 DIAGNOSIS — I110 Hypertensive heart disease with heart failure: Secondary | ICD-10-CM | POA: Diagnosis not present

## 2022-03-17 DIAGNOSIS — I872 Venous insufficiency (chronic) (peripheral): Secondary | ICD-10-CM | POA: Diagnosis not present

## 2022-03-17 DIAGNOSIS — E1165 Type 2 diabetes mellitus with hyperglycemia: Secondary | ICD-10-CM | POA: Diagnosis not present

## 2022-03-17 DIAGNOSIS — J441 Chronic obstructive pulmonary disease with (acute) exacerbation: Secondary | ICD-10-CM | POA: Diagnosis not present

## 2022-03-17 DIAGNOSIS — K219 Gastro-esophageal reflux disease without esophagitis: Secondary | ICD-10-CM | POA: Diagnosis not present

## 2022-03-17 DIAGNOSIS — G43909 Migraine, unspecified, not intractable, without status migrainosus: Secondary | ICD-10-CM | POA: Diagnosis not present

## 2022-03-17 DIAGNOSIS — Z87891 Personal history of nicotine dependence: Secondary | ICD-10-CM | POA: Diagnosis not present

## 2022-03-17 DIAGNOSIS — Z5181 Encounter for therapeutic drug level monitoring: Secondary | ICD-10-CM | POA: Diagnosis not present

## 2022-03-17 DIAGNOSIS — I25119 Atherosclerotic heart disease of native coronary artery with unspecified angina pectoris: Secondary | ICD-10-CM | POA: Diagnosis not present

## 2022-03-17 DIAGNOSIS — I420 Dilated cardiomyopathy: Secondary | ICD-10-CM | POA: Diagnosis not present

## 2022-03-17 LAB — POCT INR: INR: 2.9 (ref 2.0–3.0)

## 2022-03-17 NOTE — Progress Notes (Signed)
[name] from [facility] called with INR result of 2.9.  Continue to take 1/2 tablet (2.5 mg) once a day except take no warfarin on Sundays or Wednesdays. Recheck INR with labs during visit with Dr. Danise Mina on 11/28.  Gave above instructions to pt and [name], both verbalized understanding.

## 2022-03-17 NOTE — Patient Instructions (Signed)
Continue to take 1/2 tablet (2.5 mg) once a day except take no warfarin on Sundays or Wednesdays. Recheck INR with labs during visit with Dr. Danise Mina on 11/28.

## 2022-03-23 ENCOUNTER — Ambulatory Visit: Payer: Medicare Other | Attending: Student in an Organized Health Care Education/Training Program

## 2022-03-23 ENCOUNTER — Ambulatory Visit: Payer: Medicare Other | Admitting: Family Medicine

## 2022-03-23 ENCOUNTER — Other Ambulatory Visit (HOSPITAL_BASED_OUTPATIENT_CLINIC_OR_DEPARTMENT_OTHER): Payer: Self-pay | Admitting: Osteopathic Medicine

## 2022-03-23 ENCOUNTER — Ambulatory Visit: Payer: Medicare Other | Admitting: Family

## 2022-03-23 DIAGNOSIS — I4891 Unspecified atrial fibrillation: Secondary | ICD-10-CM | POA: Diagnosis not present

## 2022-03-23 DIAGNOSIS — Z7984 Long term (current) use of oral hypoglycemic drugs: Secondary | ICD-10-CM | POA: Diagnosis not present

## 2022-03-23 DIAGNOSIS — I25119 Atherosclerotic heart disease of native coronary artery with unspecified angina pectoris: Secondary | ICD-10-CM | POA: Diagnosis not present

## 2022-03-23 DIAGNOSIS — Z9981 Dependence on supplemental oxygen: Secondary | ICD-10-CM | POA: Diagnosis not present

## 2022-03-23 DIAGNOSIS — I5042 Chronic combined systolic (congestive) and diastolic (congestive) heart failure: Secondary | ICD-10-CM | POA: Diagnosis not present

## 2022-03-23 DIAGNOSIS — Z5181 Encounter for therapeutic drug level monitoring: Secondary | ICD-10-CM | POA: Diagnosis not present

## 2022-03-23 DIAGNOSIS — Z9181 History of falling: Secondary | ICD-10-CM | POA: Diagnosis not present

## 2022-03-23 DIAGNOSIS — J441 Chronic obstructive pulmonary disease with (acute) exacerbation: Secondary | ICD-10-CM | POA: Diagnosis not present

## 2022-03-23 DIAGNOSIS — E876 Hypokalemia: Secondary | ICD-10-CM | POA: Diagnosis not present

## 2022-03-23 DIAGNOSIS — F32A Depression, unspecified: Secondary | ICD-10-CM | POA: Diagnosis not present

## 2022-03-23 DIAGNOSIS — I420 Dilated cardiomyopathy: Secondary | ICD-10-CM | POA: Diagnosis not present

## 2022-03-23 DIAGNOSIS — I252 Old myocardial infarction: Secondary | ICD-10-CM | POA: Diagnosis not present

## 2022-03-23 DIAGNOSIS — G4733 Obstructive sleep apnea (adult) (pediatric): Secondary | ICD-10-CM | POA: Diagnosis not present

## 2022-03-23 DIAGNOSIS — I110 Hypertensive heart disease with heart failure: Secondary | ICD-10-CM | POA: Diagnosis not present

## 2022-03-23 DIAGNOSIS — Z7901 Long term (current) use of anticoagulants: Secondary | ICD-10-CM | POA: Diagnosis not present

## 2022-03-23 DIAGNOSIS — E785 Hyperlipidemia, unspecified: Secondary | ICD-10-CM | POA: Diagnosis not present

## 2022-03-23 DIAGNOSIS — M1990 Unspecified osteoarthritis, unspecified site: Secondary | ICD-10-CM | POA: Diagnosis not present

## 2022-03-23 DIAGNOSIS — Z87891 Personal history of nicotine dependence: Secondary | ICD-10-CM | POA: Diagnosis not present

## 2022-03-23 DIAGNOSIS — K219 Gastro-esophageal reflux disease without esophagitis: Secondary | ICD-10-CM | POA: Diagnosis not present

## 2022-03-23 DIAGNOSIS — G43909 Migraine, unspecified, not intractable, without status migrainosus: Secondary | ICD-10-CM | POA: Diagnosis not present

## 2022-03-23 DIAGNOSIS — E1165 Type 2 diabetes mellitus with hyperglycemia: Secondary | ICD-10-CM | POA: Diagnosis not present

## 2022-03-23 DIAGNOSIS — I872 Venous insufficiency (chronic) (peripheral): Secondary | ICD-10-CM | POA: Diagnosis not present

## 2022-03-24 ENCOUNTER — Telehealth: Payer: Self-pay

## 2022-03-24 NOTE — Progress Notes (Signed)
Chronic Care Management Pharmacy Assistant   Name: [name]  MRN: [id number] DOB: 1955/08/24  Reason for Encounter: CCM (Appointment Reminder)  Medications: Outpatient Encounter Medications as of 03/24/2022  Medication Sig Note   ACCU-CHEK FASTCLIX LANCETS MISC Check blood sugar once daily and as instructed. Dx 250.00    albuterol (VENTOLIN HFA) 108 (90 Base) MCG/ACT inhaler Inhale 2 puffs into the lungs every 6 (six) hours as needed for wheezing or shortness of breath.    amiodarone (PACERONE) 200 MG tablet Take 1 tablet (200 mg total) by mouth daily.    Blood Glucose Monitoring Suppl (BLOOD GLUCOSE MONITOR SYSTEM) W/DEVICE KIT by Does not apply route. Use to check sugar once daily and as needed Dx: E11.9 **ONE TOUCH VERIO**    Budeson-Glycopyrrol-Formoterol (BREZTRI AEROSPHERE) 160-9-4.8 MCG/ACT AERO Inhale 2 puffs into the lungs 2 (two) times daily.    carvedilol (COREG) 6.25 MG tablet TAKE 1 TABLET BY MOUTH TWICE A DAY WITH FOOD    Cholecalciferol (VITAMIN D) 50 MCG (2000 UT) CAPS Take 1 capsule (2,000 Units total) by mouth daily.    dapagliflozin propanediol (FARXIGA) 10 MG TABS tablet Take 1 tablet (10 mg total) by mouth daily before breakfast.    Docusate Calcium (STOOL SOFTENER PO) Take 1 tablet by mouth daily.    FLUoxetine (PROZAC) 40 MG capsule Take 1 capsule (40 mg total) by mouth daily.    fluticasone (FLONASE) 50 MCG/ACT nasal spray Place 2 sprays into both nostrils daily. 03/05/2022: Patient states uses as needed   furosemide (LASIX) 80 MG tablet Take 80 mg by mouth 2 (two) times daily.    glimepiride (AMARYL) 4 MG tablet Take 1 tablet (4 mg total) by mouth daily with breakfast.    glucose blood (ONETOUCH VERIO) test strip Check blood sugar 3 times a day    ipratropium-albuterol (DUONEB) 0.5-2.5 (3) MG/3ML SOLN Take 3 mLs by nebulization every 6 (six) hours as needed (severe shortness of breath/wheezing).    metFORMIN (GLUCOPHAGE) 1000 MG tablet Take 1 tablet  (1,000 mg total) by mouth 2 (two) times daily with a meal.    metolazone (ZAROXOLYN) 2.5 MG tablet Take 1 tablet (2.5 mg total) by mouth 2 (two) times a week. Saturday and wednesday    montelukast (SINGULAIR) 10 MG tablet TAKE 1 TABLET BY MOUTH EVERYDAY AT BEDTIME    pantoprazole (PROTONIX) 40 MG tablet TAKE 1 TABLET (40 MG TOTAL) BY MOUTH TWICE A DAY BEFORE MEALS    potassium chloride (KLOR-CON M) 10 MEQ tablet Take 1 tablet (10 mEq total) by mouth daily. And an extra one on the days you take metolazone    Probiotic Product (PROBIOTIC DAILY PO) Take 1 tablet by mouth daily.    simvastatin (ZOCOR) 20 MG tablet TAKE 1 TABLET BY MOUTH EVERY DAY IN THE EVENING    torsemide (DEMADEX) 10 MG tablet Take 10 mg by mouth daily. (Patient not taking: Reported on 03/05/2022) 02/23/2022: PT NOT SURE IF PCP STOPPED THIS MED, BUT HAS FOUND A BOTTLE AND RESTARTED TAKING IT.   Turmeric (QC TUMERIC COMPLEX PO) Take by mouth.    vitamin B-12 (CYANOCOBALAMIN) 1000 MCG tablet Take 1 tablet (1,000 mcg total) by mouth every Monday, Wednesday, and Friday.    warfarin (COUMADIN) 5 MG tablet TAKE 1/2 TABLET BY MOUTH DAILY EXCEPT TAKE NOTHING ON WEDNESDAYS OR AS DIRECTED BY ANTICOAGULATION CLINIC (Patient taking differently: TAKE 1/2 TABLET BY MOUTH DAILY EXCEPT TAKE NOTHING ON WEDNESDAYS OR SATURDAY AS DIRECTED BY ANTICOAGULATION CLINIC)  No facility-administered encounter medications on file as of 03/24/2022.   [name] was contacted to remind of upcoming telephone visit with [name] on 03/29/2022 at 1:30. Patient was reminded to have any blood glucose and blood pressure readings available for review at appointment.   Message was left reminding patient of appointment.  CCM referral has been placed prior to visit?  Yes   Star Rating Drugs: Medication:  Last Fill: Day Supply Glimepiride 4 mg 02/14/2022 90 Metformin 1000 mg 01/23/2022 [place], CPP notified  Marijean Niemann, [place]  Pharmacy Assistant [phone]

## 2022-03-25 ENCOUNTER — Telehealth: Payer: Self-pay

## 2022-03-25 DIAGNOSIS — K219 Gastro-esophageal reflux disease without esophagitis: Secondary | ICD-10-CM | POA: Diagnosis not present

## 2022-03-25 DIAGNOSIS — F32A Depression, unspecified: Secondary | ICD-10-CM | POA: Diagnosis not present

## 2022-03-25 DIAGNOSIS — G43909 Migraine, unspecified, not intractable, without status migrainosus: Secondary | ICD-10-CM | POA: Diagnosis not present

## 2022-03-25 DIAGNOSIS — I872 Venous insufficiency (chronic) (peripheral): Secondary | ICD-10-CM | POA: Diagnosis not present

## 2022-03-25 DIAGNOSIS — Z9981 Dependence on supplemental oxygen: Secondary | ICD-10-CM | POA: Diagnosis not present

## 2022-03-25 DIAGNOSIS — J441 Chronic obstructive pulmonary disease with (acute) exacerbation: Secondary | ICD-10-CM | POA: Diagnosis not present

## 2022-03-25 DIAGNOSIS — E876 Hypokalemia: Secondary | ICD-10-CM | POA: Diagnosis not present

## 2022-03-25 DIAGNOSIS — I4891 Unspecified atrial fibrillation: Secondary | ICD-10-CM | POA: Diagnosis not present

## 2022-03-25 DIAGNOSIS — Z9181 History of falling: Secondary | ICD-10-CM | POA: Diagnosis not present

## 2022-03-25 DIAGNOSIS — E785 Hyperlipidemia, unspecified: Secondary | ICD-10-CM | POA: Diagnosis not present

## 2022-03-25 DIAGNOSIS — Z7901 Long term (current) use of anticoagulants: Secondary | ICD-10-CM | POA: Diagnosis not present

## 2022-03-25 DIAGNOSIS — I420 Dilated cardiomyopathy: Secondary | ICD-10-CM | POA: Diagnosis not present

## 2022-03-25 DIAGNOSIS — Z5181 Encounter for therapeutic drug level monitoring: Secondary | ICD-10-CM | POA: Diagnosis not present

## 2022-03-25 DIAGNOSIS — G4733 Obstructive sleep apnea (adult) (pediatric): Secondary | ICD-10-CM | POA: Diagnosis not present

## 2022-03-25 DIAGNOSIS — I25119 Atherosclerotic heart disease of native coronary artery with unspecified angina pectoris: Secondary | ICD-10-CM | POA: Diagnosis not present

## 2022-03-25 DIAGNOSIS — I110 Hypertensive heart disease with heart failure: Secondary | ICD-10-CM | POA: Diagnosis not present

## 2022-03-25 DIAGNOSIS — I252 Old myocardial infarction: Secondary | ICD-10-CM | POA: Diagnosis not present

## 2022-03-25 DIAGNOSIS — M1990 Unspecified osteoarthritis, unspecified site: Secondary | ICD-10-CM | POA: Diagnosis not present

## 2022-03-25 DIAGNOSIS — Z87891 Personal history of nicotine dependence: Secondary | ICD-10-CM | POA: Diagnosis not present

## 2022-03-25 DIAGNOSIS — Z7984 Long term (current) use of oral hypoglycemic drugs: Secondary | ICD-10-CM | POA: Diagnosis not present

## 2022-03-25 DIAGNOSIS — E1165 Type 2 diabetes mellitus with hyperglycemia: Secondary | ICD-10-CM | POA: Diagnosis not present

## 2022-03-25 DIAGNOSIS — I5042 Chronic combined systolic (congestive) and diastolic (congestive) heart failure: Secondary | ICD-10-CM | POA: Diagnosis not present

## 2022-03-25 LAB — BASIC METABOLIC PANEL
CO2: 37 — AB (ref 13–22)
Chloride: 82 — AB (ref 99–108)
Creatinine: 1.2 (ref 0.6–1.3)
Glucose: 98
Potassium: 3.9 mEq/L (ref 3.5–5.1)
Sodium: 139 (ref 137–147)

## 2022-03-25 LAB — CBC AND DIFFERENTIAL
Hemoglobin: 9.5 — AB (ref 13.5–17.5)
Platelets: 407 10*3/uL — AB (ref 150–400)
WBC: 13

## 2022-03-25 LAB — COMPREHENSIVE METABOLIC PANEL: Calcium: 10.2 (ref 8.7–10.7)

## 2022-03-25 NOTE — Telephone Encounter (Signed)
Called and spoke with [name] and she will have those labs drawn when she gets to patients house.

## 2022-03-25 NOTE — Telephone Encounter (Signed)
Dr. Darnell Level has order for BMET and CBC in EMR.  Would collect those is possible.  Thanks.

## 2022-03-25 NOTE — Telephone Encounter (Signed)
[name] with [facility] called and is seeing pt today around 4 PM. [name] wants to know if she needs to do just PT INR or is there other labs she can do while there.pt has lost his car due to mechanical problems and no transportation and pt had to cancel recent visit with Dr Darnell Level. [name] request cb when reviewed. [name] said end of Oct 2023 pt had to receive 2 units of blood and since transfusion pt is not acting his normal self [name] said he is "Blah acting" pt denies tarry stools and no blood seen per [name]. Sending note to Dr Darnell Level who is out of office, Dr Damita Dunnings who is in office and I will speak with  Lattie Haw CMA.

## 2022-03-26 ENCOUNTER — Ambulatory Visit: Payer: Self-pay

## 2022-03-26 LAB — POCT INR
INR: 1.5 — AB (ref 0.80–1.20)
INR: 1.5 — AB (ref 2.0–3.0)

## 2022-03-26 MED ORDER — WARFARIN SODIUM 5 MG PO TABS: ORAL_TABLET | ORAL | 1 refills | Status: DC

## 2022-03-26 NOTE — Telephone Encounter (Signed)
Noted! Thank you

## 2022-03-26 NOTE — Telephone Encounter (Addendum)
I spoke with Dr Darnell Level and he said for pt to take Coumadin 5 mg taking one tab today on 03/26/22 and then going to increase dose to pt taking 1/2 tab daily except No coumadin on Sun. Recheck INR in one wk. [name] notified as instructed and [name] repeated orders and voiced understanding and [name] will contact pt with this information. Medication list updated.Sending note back to Dr Darnell Level as Juluis Rainier.

## 2022-03-26 NOTE — Addendum Note (Signed)
Addended by: Helene Shoe on: 03/26/2022 09:37 AM   Modules accepted: Orders

## 2022-03-26 NOTE — Telephone Encounter (Signed)
[name] with [facility] called INR 1.5.  [name] said last wk 03/18/22 was 2.9 and Corene Cornea continued normal coumadin dose of 1/2 tab daily except no coumadin on Sun or Wed. Week before that 03/11/22 INR 1.3  and Corene Cornea boosted pt taking 1 tab on 03/11/22 and 1 tab on 03/12/22 and then pt went back on 03/13/22 to regular dose of 1/2 tab daily and no coumadin on Sun or Wed. [name] said pt took 1 tab of coumadin on 03/25/22 and pt is waiting for further instructions from Dr Darnell Level on how to proceed. [name] said pt is having intermittent dizziness but thinks pt may not be drinking enough water. [name] said BMP and CBC results should report later today. [name] request cb after reviewed by Dr Darnell Level and [name] will call and text pt. Note sent to Dr Cristopher Peru pool and will speak with [facility] CMA.

## 2022-03-26 NOTE — Patient Outreach (Signed)
  Care Coordination   03/26/2022 Name: TULIO FACUNDO MRN: [id number] DOB: 17-May-1955   Care Coordination Outreach Attempts:  An unsuccessful telephone outreach was attempted for a scheduled appointment today.  Follow Up Plan:  Additional outreach attempts will be made to offer the patient care coordination information and services.   Encounter Outcome:  No Answer   Care Coordination Interventions:  No, not indicated    Quinn Plowman [facility] [place] [phone] direct line

## 2022-03-29 ENCOUNTER — Ambulatory Visit (INDEPENDENT_AMBULATORY_CARE_PROVIDER_SITE_OTHER): Payer: Medicare Other | Admitting: Family Medicine

## 2022-03-29 ENCOUNTER — Ambulatory Visit (INDEPENDENT_AMBULATORY_CARE_PROVIDER_SITE_OTHER): Payer: Medicare Other

## 2022-03-29 ENCOUNTER — Encounter: Payer: Self-pay | Admitting: Family Medicine

## 2022-03-29 ENCOUNTER — Telehealth: Payer: Self-pay | Admitting: Pharmacist

## 2022-03-29 ENCOUNTER — Telehealth: Payer: Medicare Other

## 2022-03-29 VITALS — BP 134/66 | HR 70 | Temp 97.2°F | Ht 71.0 in | Wt 238.8 lb

## 2022-03-29 DIAGNOSIS — E1169 Type 2 diabetes mellitus with other specified complication: Secondary | ICD-10-CM | POA: Diagnosis not present

## 2022-03-29 DIAGNOSIS — G4733 Obstructive sleep apnea (adult) (pediatric): Secondary | ICD-10-CM | POA: Diagnosis not present

## 2022-03-29 DIAGNOSIS — I5032 Chronic diastolic (congestive) heart failure: Secondary | ICD-10-CM

## 2022-03-29 DIAGNOSIS — D508 Other iron deficiency anemias: Secondary | ICD-10-CM

## 2022-03-29 DIAGNOSIS — J432 Centrilobular emphysema: Secondary | ICD-10-CM

## 2022-03-29 DIAGNOSIS — R0609 Other forms of dyspnea: Secondary | ICD-10-CM | POA: Diagnosis not present

## 2022-03-29 DIAGNOSIS — D649 Anemia, unspecified: Secondary | ICD-10-CM | POA: Diagnosis not present

## 2022-03-29 DIAGNOSIS — I4819 Other persistent atrial fibrillation: Secondary | ICD-10-CM | POA: Diagnosis not present

## 2022-03-29 DIAGNOSIS — E66811 Obesity, class 1: Secondary | ICD-10-CM

## 2022-03-29 DIAGNOSIS — E669 Obesity, unspecified: Secondary | ICD-10-CM

## 2022-03-29 MED ORDER — IRON (FERROUS SULFATE) 325 (65 FE) MG PO TABS: 325.0000 mg | ORAL_TABLET | ORAL | Status: DC

## 2022-03-29 MED ORDER — SAW PALMETTO 450 MG PO CAPS: 1.0000 | ORAL_CAPSULE | Freq: Every day | ORAL | 0 refills | Status: DC

## 2022-03-29 NOTE — Progress Notes (Incomplete)
Patient ID: [name], male    DOB: 09/30/1955, 66 y.o.   MRN: [id number]  This visit was conducted in person.  BP 134/66   Pulse 70   Temp (!) 97.2 F (36.2 C) (Temporal)   Ht _0  (1.803 m)   Wt 238 lb 12.8 oz (108.3 kg)   SpO2 94%   BMI 33.31 kg/m    CC: ER f/u visit  Subjective:   HPI: [name] is a 66 y.o. male presenting on 03/29/2022 for Hospitalization Follow-up (Seen on 02/23/22 at [facility] ED, dx symptomatic anemia. )   "I feel good yet I feel terrible".  Notes increased nausea - decreased appetite - decreased weight. He is eating healthier - more fish and vegetables. He limits salt, prepared meals and soups.  Notes ongoing unsteadiness on his feet which he attributes to foggy vision.   CHF - home weights have been 230s. Today's was Weight: 238 lb 12.8 oz (108.3 kg).   Marked symptomatic anemia in setting of supratherapeutic INR 01/2022 s/p ER evaluation with blood transfusion x2 units pRBC 02/23/2022 (Hgb at that time was 6.8) . Here for ER f/u visit from that transfusion. Oral iron caused constipation so he stopped this.   Recent subtherapeutic INR 1.5 (03/26/2022) - additional doses for 2 days then maintenance dose of 53m 1/2 tab daily except Wed/Sun  increased to 560m1/2 daily except Sundays. Continues coumadin for indication of atrial fibrillation.   Home health drew labs 12/1 (BMP, CBC) - I still don't have results.  [facility] appt scheduled 06/2022.   Has upcoming appt 04/03/2023 for mobility assessment.   DM - continues farxiga 105maily, amaryl 4mg78mily, metformin 1000mg34m. Cbg's at home - fasting 176 this morning.  Lab Results  Component Value Date   HGBA1C 8.6 (H) 01/03/2022    COPD/OSA - saw pulm, PFTs scheduled, sleep study was scheduled but cancelled.   He's had significant transportation issues. He hasn't had luck with Medicare covered transportation services. Currently without a car - it's still in the shop. Friend drove him  here today.      Relevant past medical, surgical, family and social history reviewed and updated as indicated. Interim medical history since our last visit reviewed. Allergies and medications reviewed and updated. Outpatient Medications Prior to Visit  Medication Sig Dispense Refill  . ACCU-CHEK FASTCLIX LANCETS MISC Check blood sugar once daily and as instructed. Dx 250.00 100 each 3  . albuterol (VENTOLIN HFA) 108 (90 Base) MCG/ACT inhaler Inhale 2 puffs into the lungs every 6 (six) hours as needed for wheezing or shortness of breath. 8.5 each 6  . amiodarone (PACERONE) 200 MG tablet Take 1 tablet (200 mg total) by mouth daily. 90 tablet 3  . Blood Glucose Monitoring Suppl (BLOOD GLUCOSE MONITOR SYSTEM) W/DEVICE KIT by Does not apply route. 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 Budeson-Glycopyrrol-Formoterol (BREZTRI AEROSPHERE) 160-9-4.8 MCG/ACT AERO Inhale 2 puffs into the lungs 2 (two) times daily. 32.1 g 3  . carvedilol (COREG) 6.25 MG tablet TAKE 1 TABLET BY MOUTH TWICE A DAY WITH FOOD 180 tablet 0  . Cholecalciferol (VITAMIN D) 50 MCG (2000 UT) CAPS Take 1 capsule (2,000 Units total) by mouth daily. 30 capsule   . dapagliflozin propanediol (FARXIGA) 10 MG TABS tablet Take 1 tablet (10 mg total) by mouth daily before breakfast. 90 tablet 3  . Docusate Calcium (STOOL SOFTENER PO)  Take 1 tablet by mouth daily.    Marland Kitchen FLUoxetine (PROZAC) 40 MG capsule Take 1 capsule (40 mg total) by mouth daily. 30 capsule 6  . fluticasone (FLONASE) 50 MCG/ACT nasal spray Place 2 sprays into both nostrils daily. 48 mL 3  . furosemide (LASIX) 80 MG tablet Take 80 mg by mouth 2 (two) times daily.    Marland Kitchen glimepiride (AMARYL) 4 MG tablet Take 1 tablet (4 mg total) by mouth daily with breakfast. 90 tablet 1  . glucose blood (ONETOUCH VERIO) test strip Check blood sugar 3 times a day 300 strip 3  . ipratropium-albuterol (DUONEB) 0.5-2.5 (3) MG/3ML SOLN TAKE 3 MLS BY  NEBULIZATION EVERY 6 (SIX) HOURS AS NEEDED (SEVERE SHORTNESS OF BREATH/WHEEZING). 360 mL 0  . metFORMIN (GLUCOPHAGE) 1000 MG tablet Take 1 tablet (1,000 mg total) by mouth 2 (two) times daily with a meal. 180 tablet 1  . metolazone (ZAROXOLYN) 2.5 MG tablet Take 1 tablet (2.5 mg total) by mouth 2 (two) times a week. Saturday and wednesday 8 tablet 5  . montelukast (SINGULAIR) 10 MG tablet TAKE 1 TABLET BY MOUTH EVERYDAY AT BEDTIME 90 tablet 3  . pantoprazole (PROTONIX) 40 MG tablet TAKE 1 TABLET (40 MG TOTAL) BY MOUTH TWICE A DAY BEFORE MEALS 180 tablet 0  . potassium chloride (KLOR-CON M) 10 MEQ tablet Take 1 tablet (10 mEq total) by mouth daily. And an extra one on the days you take metolazone 38 tablet 5  . Probiotic Product (PROBIOTIC DAILY PO) Take 1 tablet by mouth daily.    . simvastatin (ZOCOR) 20 MG tablet TAKE 1 TABLET BY MOUTH EVERY DAY IN THE EVENING 90 tablet 0  . Turmeric (QC TUMERIC COMPLEX PO) Take by mouth.    . vitamin B-12 (CYANOCOBALAMIN) 1000 MCG tablet Take 1 tablet (1,000 mcg total) by mouth every Monday, Wednesday, and Friday.    . warfarin (COUMADIN) 5 MG tablet TAKE 1/2 TABLET BY MOUTH DAILY EXCEPT TAKE NO COUMADIN ON SUNDAY OR AS DIRECTED BY ANTICOAGULATION CLINIC 60 tablet 1  . torsemide (DEMADEX) 10 MG tablet Take 10 mg by mouth daily. (Patient not taking: Reported on 03/05/2022)     No facility-administered medications prior to visit.     Per HPI unless specifically indicated in ROS section below Review of Systems  Objective:  BP 134/66   Pulse 70   Temp (!) 97.2 F (36.2 C) (Temporal)   Ht _0  (1.803 m)   Wt 238 lb 12.8 oz (108.3 kg)   SpO2 94%   BMI 33.31 kg/m   Wt Readings from Last 3 Encounters:  03/29/22 238 lb 12.8 oz (108.3 kg)  02/25/22 264 lb (119.7 kg)  02/22/22 262 lb (118.8 kg)      Physical Exam Vitals and nursing note reviewed.  Constitutional:      Appearance: Normal appearance. He is not ill-appearing.  HENT:     Head:  Normocephalic and atraumatic.     Mouth/Throat:     Mouth: Mucous membranes are moist.     Pharynx: Oropharynx is clear. No oropharyngeal exudate or posterior oropharyngeal erythema.     Comments: Has dentures Eyes:     Extraocular Movements: Extraocular movements intact.     Pupils: Pupils are equal, round, and reactive to light.  Cardiovascular:     Rate and Rhythm: Normal rate and regular rhythm.     Pulses: Normal pulses.     Heart sounds: Murmur (3/6 systolic) heard.  Pulmonary:     Effort:  Pulmonary effort is normal. No respiratory distress.     Breath sounds: No wheezing, rhonchi or rales.     Comments: Bibasilar crackles Musculoskeletal:        General: Swelling present.     Right lower leg: No edema.     Left lower leg: No edema.  Skin:    General: Skin is warm and dry.     Findings: No rash.  Neurological:     Mental Status: He is alert.  Psychiatric:        Mood and Affect: Mood normal.        Behavior: Behavior normal.       Results for orders placed or performed in visit on 03/29/22  POCT INR  Result Value Ref Range   INR 1.50 (A) 0.80 - 1.20    Assessment & Plan:   Problem List Items Addressed This Visit   None    Meds ordered this encounter  Medications  . Saw Palmetto 450 MG CAPS    Sig: Take 1 capsule (450 mg total) by mouth daily.    Refill:  0  . Iron, Ferrous Sulfate, 325 (65 Fe) MG TABS    Sig: Take 325 mg by mouth every Monday, Wednesday, and Friday.   Orders Placed This Encounter  Procedures  . POCT INR    This external order was created through the Results Console.     Patient Instructions  Restart saw palemtto. Restart oral iron 2-3 times a week. Reschedule eye exam. Vision screen today.  Continue coumadin 50m 1/2 tablet daily except for Sundays.  Drop metolazone 2.552mto once weekly dosing.  Good to see you today.   Follow up plan: Return if symptoms worsen or fail to improve.  JaRia BushMD

## 2022-03-29 NOTE — Progress Notes (Unsigned)
Patient ID: [name], male    DOB: [birth year], 66 y.o.   MRN: [id number]  This visit was conducted in person.  BP 134/66   Pulse 70   Temp (!) 97.2 F (36.2 C) (Temporal)   Ht _0  (1.803 m)   Wt 239 lb (108.4 kg)   SpO2 94%   BMI 33.33 kg/m    CC: ER f/u visit  Subjective:   HPI: [name] is a 66 y.o. male presenting on 03/29/2022 for Hospitalization Follow-up (Seen on 02/23/22 at [facility] ED, dx symptomatic anemia. )   "I feel good yet I feel terrible".  Notes increased nausea - decreased appetite - losing weight. He is eating healthier - more fish and vegetables. He limits salt, prepared meals and soups.  Notes ongoing unsteadiness on his feet which he attributes to foggy vision.   HF - home weights have been 230s. Today's was Weight: 239 lb (108.4 kg).   Marked symptomatic anemia in setting of supratherapeutic INR 01/2022 s/p ER evaluation with blood transfusion x2 units pRBC 02/23/2022 (Hgb at that time was 6.8) . Here for ER f/u visit from that transfusion. Oral iron caused constipation so he stopped this.   Recent subtherapeutic INR 1.5 (03/26/2022) - additional doses for 2 days then maintenance dose of 79m 1/2 tab daily except Wed/Sun  increased to 566m1/2 daily except Sundays. Continues coumadin for indication of atrial fibrillation.   Home health drew labs 12/1 (BMP, CBC) - I still don't have results.  [facility] appt scheduled 06/2022.   Has upcoming appt 04/03/2023 for mobility assessment.   DM - continues farxiga 1042maily, amaryl 4mg26mily, metformin 1000mg29m. Cbg's at home - fasting 176 this morning.  Lab Results  Component Value Date   HGBA1C 8.6 (H) 01/03/2022    COPD/OSA - saw pulm, PFTs scheduled, sleep study was scheduled but cancelled.   He's had significant transportation issues. He hasn't had luck with Medicare covered transportation services. Currently without a car - it's still in the shop. Friend drove him here today.       Relevant past medical, surgical, family and social history reviewed and updated as indicated. Interim medical history since our last visit reviewed. Allergies and medications reviewed and updated. Outpatient Medications Prior to Visit  Medication Sig Dispense Refill   ACCU-CHEK FASTCLIX LANCETS MISC Check blood sugar once daily and as instructed. Dx 250.00 100 each 3   albuterol (VENTOLIN HFA) 108 (90 Base) MCG/ACT inhaler Inhale 2 puffs into the lungs every 6 (six) hours as needed for wheezing or shortness of breath. 8.5 each 6   amiodarone (PACERONE) 200 MG tablet Take 1 tablet (200 mg total) by mouth daily. 90 tablet 3   Blood Glucose Monitoring Suppl (BLOOD GLUCOSE MONITOR SYSTEM) W/DEVICE KIT by Does not apply route. Use to check sugar once daily and as needed Dx: E11.9 **ONE TOUCH VERIO**     Budeson-Glycopyrrol-Formoterol (BREZTRI AEROSPHERE) 160-9-4.8 MCG/ACT AERO Inhale 2 puffs into the lungs 2 (two) times daily. 32.1 g 3   carvedilol (COREG) 6.25 MG tablet TAKE 1 TABLET BY MOUTH TWICE A DAY WITH FOOD 180 tablet 0   Cholecalciferol (VITAMIN D) 50 MCG (2000 UT) CAPS Take 1 capsule (2,000 Units total) by mouth daily. 30 capsule    dapagliflozin propanediol (FARXIGA) 10 MG TABS tablet Take 1 tablet (10 mg total) by mouth daily before breakfast. 90 tablet 3   Docusate Calcium (STOOL SOFTENER PO) Take 1 tablet by  mouth daily.     FLUoxetine (PROZAC) 40 MG capsule Take 1 capsule (40 mg total) by mouth daily. 30 capsule 6   fluticasone (FLONASE) 50 MCG/ACT nasal spray Place 2 sprays into both nostrils daily. 48 mL 3   furosemide (LASIX) 80 MG tablet Take 80 mg by mouth 2 (two) times daily.     glimepiride (AMARYL) 4 MG tablet Take 1 tablet (4 mg total) by mouth daily with breakfast. 90 tablet 1   glucose blood (ONETOUCH VERIO) test strip Check blood sugar 3 times a day 300 strip 3   ipratropium-albuterol (DUONEB) 0.5-2.5 (3) MG/3ML SOLN TAKE 3 MLS BY NEBULIZATION EVERY 6 (SIX) HOURS AS NEEDED  (SEVERE SHORTNESS OF BREATH/WHEEZING). 360 mL 0   metFORMIN (GLUCOPHAGE) 1000 MG tablet Take 1 tablet (1,000 mg total) by mouth 2 (two) times daily with a meal. 180 tablet 1   metolazone (ZAROXOLYN) 2.5 MG tablet Take 1 tablet (2.5 mg total) by mouth 2 (two) times a week. Saturday and wednesday 8 tablet 5   montelukast (SINGULAIR) 10 MG tablet TAKE 1 TABLET BY MOUTH EVERYDAY AT BEDTIME 90 tablet 3   pantoprazole (PROTONIX) 40 MG tablet TAKE 1 TABLET (40 MG TOTAL) BY MOUTH TWICE A DAY BEFORE MEALS 180 tablet 0   potassium chloride (KLOR-CON M) 10 MEQ tablet Take 1 tablet (10 mEq total) by mouth daily. And an extra one on the days you take metolazone 38 tablet 5   Probiotic Product (PROBIOTIC DAILY PO) Take 1 tablet by mouth daily.     simvastatin (ZOCOR) 20 MG tablet TAKE 1 TABLET BY MOUTH EVERY DAY IN THE EVENING 90 tablet 0   Turmeric (QC TUMERIC COMPLEX PO) Take by mouth.     vitamin B-12 (CYANOCOBALAMIN) 1000 MCG tablet Take 1 tablet (1,000 mcg total) by mouth every Monday, Wednesday, and Friday.     warfarin (COUMADIN) 5 MG tablet TAKE 1/2 TABLET BY MOUTH DAILY EXCEPT TAKE NO COUMADIN ON SUNDAY OR AS DIRECTED BY ANTICOAGULATION CLINIC 60 tablet 1   torsemide (DEMADEX) 10 MG tablet Take 10 mg by mouth daily. (Patient not taking: Reported on 03/05/2022)     No facility-administered medications prior to visit.     Per HPI unless specifically indicated in ROS section below Review of Systems  Objective:  BP 134/66   Pulse 70   Temp (!) 97.2 F (36.2 C) (Temporal)   Ht _0  (1.803 m)   Wt 239 lb (108.4 kg)   SpO2 94%   BMI 33.33 kg/m   Wt Readings from Last 3 Encounters:  03/29/22 239 lb (108.4 kg)  02/25/22 264 lb (119.7 kg)  02/22/22 262 lb (118.8 kg)      Physical Exam Vitals and nursing note reviewed.  Constitutional:      Appearance: Normal appearance. He is not ill-appearing.  HENT:     Head: Normocephalic and atraumatic.     Mouth/Throat:     Mouth: Mucous membranes  are moist.     Pharynx: Oropharynx is clear. No oropharyngeal exudate or posterior oropharyngeal erythema.     Comments: Has dentures Eyes:     Extraocular Movements: Extraocular movements intact.     Pupils: Pupils are equal, round, and reactive to light.  Cardiovascular:     Rate and Rhythm: Normal rate and regular rhythm.     Pulses: Normal pulses.     Heart sounds: Murmur (3/6 systolic) heard.  Pulmonary:     Effort: Pulmonary effort is normal. No respiratory distress.  Breath sounds: No wheezing, rhonchi or rales.     Comments: Bibasilar crackles Musculoskeletal:        General: Swelling present.     Right lower leg: No edema.     Left lower leg: No edema.  Skin:    General: Skin is warm and dry.     Findings: No rash.  Neurological:     Mental Status: He is alert.  Psychiatric:        Mood and Affect: Mood normal.        Behavior: Behavior normal.       Results for orders placed or performed in visit on 03/29/22  POCT INR  Result Value Ref Range   INR 1.50 (A) 0.80 - 1.20    Assessment & Plan:   Problem List Items Addressed This Visit   None    No orders of the defined types were placed in this encounter.  Orders Placed This Encounter  Procedures   POCT INR    This external order was created through the Results Console.     Patient Instructions  Restart saw palemtto. Restart oral iron 2-3 times a week. Reschedule eye exam. Vision screen today.  Continue coumadin 50m 1/2 tablet daily except for Sundays.  Drop metolazone 2.519mto once weekly dosing.  Good to see you today.   Follow up plan: No follow-ups on file.  JaRia BushMD

## 2022-03-29 NOTE — Progress Notes (Signed)
Encounter created for documentation purposes.  [name] from [facility] called with INR result of 1.5 on 03/26/22. [facility] RN out of office.   Result given to Dr. Danise Mina. Per Dr. Bosie Clos instruction:  Increase warfarin dose today to one 5 mg tablet then increase weekly dose to 1/2 tablet daily except take no warfarin on Sundays. Recheck INR in one week.  [name] called and made aware of above instructions. Verbalized understanding.

## 2022-03-29 NOTE — Telephone Encounter (Signed)
  Chronic Care Management   Outreach Note  03/29/2022 Name: [name] MRN: [id number] DOB: 15-Jan-1956  Referred by: [name], MD  Patient had a phone appointment scheduled with clinical pharmacist today.  An unsuccessful telephone outreach was attempted today. The patient was referred to the pharmacist for assistance with medications, care management and care coordination.   Patient will NOT be penalized in any way for missing a CCM appointment. The no-show fee does not apply.  If possible, a message was left to return call to: [phone] or to [facility].  [name], PharmD, BCACP Clinical Pharmacist [facility] [phone]

## 2022-03-29 NOTE — Patient Instructions (Addendum)
Restart saw palemtto. Restart oral iron 2-3 times a week. Reschedule eye exam. Vision screen today.  Continue coumadin '5mg'$  1/2 tablet daily except for Sundays.  Drop metolazone 2.'5mg'$  to once weekly dosing.  Good to see you today.

## 2022-03-29 NOTE — Progress Notes (Deleted)
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 INR in therapeutic range, with frequent fluid overload in s/o of acute HF; pt would probably do better with DOAC but could not afford Eliquis in donut hole -Recommended to continue current medication; re-evaluate possibility of DOAC in 2024  Hyperlipidemia / CAD (LDL goal < 70) -Controlled - LDL 50 (12/2021) at goal -Hx CAD (3v disease on CT scan); also AAA; no aspirin due to warfarin -Current treatment: Simvastatin 20 mg daily HS -  Appropriate, Effective, Safe, Accessible -Medications previously tried: n/a  -Educated on Cholesterol goals; Benefits of statin for ASCVD risk reduction; -Recommended to continue current medication  Diabetes (A1c goal <8%) -Uncontrolled, improving - A1c 8.6% (12/2021); he has started watching his diet and home BG appears improved; he is on Farxiga 5 mg and dose was recently increase to 10 mg, originally send to pharmacy instead of PAP but this has been corrected, he will receive shipment of 10 mg within a few days -Current home glucose readings fasting glucose: 156 - 210 -Denies hypoglycemic/hyperglycemic symptoms -Meal patterns: Pt does not sleep well, breakfast is sometimes 2pm -Current medications: Metformin 1000 mg BID - Appropriate, Query Effective Glimepiride 4 mg daily -Appropriate, Query Effective Farxiga 10 mg daily -Appropriate, Query Effective One Touch [employer]io supplies -Medications previously tried: Januvia (cost) -Educated on A1c and blood sugar goals; Complications of diabetes including kidney damage, retinal damage, and cardiovascular disease; Benefits of routine self-monitoring of blood sugar; -Recommended to continue current medication  COPD (Goal: control symptoms and prevent exacerbations) -Controlled - 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; he has 2 inhalers remaining from [employer] -Re-established with pulmonology 12/2021 - ordered PFTs and sleep study and started BiPAP ininterim -Gold Grade: Gold 3 (FEV1 30-49%) -Current COPD Classification:  D (high sx, >/=2 exacerbations/yr) -Pulmonary function testing: Spirometry 05/2018: pre: FVC 54%, FEV1 51%, ratio 0.71 post: FVC 54%, FEV1 49%, ratio 0.69 -Exacerbations requiring treatment in last 6 months: 0 (last 07/2020) -Current treatment  Breztri 160-9-4.8 mcg/act 2 puff BID (PAP)- Appropriate, Effective, Safe, Accessible Atrovent (ipratropium) 2 puffs PRN - Appropriate, Effective, Safe,  Accessible Albuterol HFA -Appropriate, Effective, Safe, Accessible Albuterol neb -Appropriate, Effective, Safe, Accessible Montelukast 10 mg daily HS -Appropriate, Effective, Safe, Accessible Flonase nasal spray -Appropriate, Effective, Safe, Accessible Oxygen -Medications previously tried: Trelegy, Advair, Spiriva -Patient reports consistent use of maintenance inhaler -Counseled on Proper inhaler technique; Benefits of consistent maintenance inhaler use -Recommend to continue current medication  Depression/Anxiety (Goal: manage symptoms) -Improved - recently switched from citalopram to fluoxetine, pt reports feeling a little better so far -Self medicating with marijuana - has switched from smoking to edibles recently -Reports more panic attacks lately, improved somewhat since switch to fluoxetine -PHQ9: 0 (12/2021) - minimal depression -GAD7: 8 (12/2021) - mild/moderate anxiety; hx panic attacks -Connected with PCP for mental health support -Current treatment: Fluoxetine 40 mg daily - Appropriate, Query Effective (too soon to tell) -Medications previously tried/failed: citalopram, hydroxyzine (ineffective) -Educated on Benefits of medication for symptom control -Recommended to continue current medication  OSA/Insomnia -Uncontrolled  -  Hx OSA on CPAP; he is noncompliant with CPAP due to uncomfortable mask, he has not tried new mask in a few years;  -Pt had to cancel sleep study due to transportation issues (car broke down); he did not have success with insurance transportation (they were late and he had to find another way to get to appt) -Advised to reschedule sleep study when able  Anemia  -Pt went to ER 10/31 for blood tranfusion, he received 2 units PRBC and was discharged home (pt refused admission), he was advised to follow up shortly with PCP -Current treatment  Vitamin B12 1000 mcg MWF -Medications previously tried: oral iron (GI upset)  -Per chart review, pt was meant to  follow up with PCP 11/7 but cancelled appt due to transportation issues (car broke down), appt was rescheduled for 12/8, however he may need repeat labs (following PRBC transfusion <2 weeks ago) sooner than that; will discuss with PCP and see if sooner appt is needed  Other: PT home health - helping with balance, gait Nursing with home health - checking for edema, weights  Patient Goals/Self-Care Activities Patient will:  - take medications as prescribed as evidenced by patient report and record review focus on medication adherence by routine check glucose daily, document, and provide at future appointments check blood pressure daily, document, and provide at future appointments

## 2022-03-29 NOTE — Assessment & Plan Note (Signed)
Sounds regular, continues coumadin.  Recent dosing as per below.

## 2022-03-29 NOTE — Patient Instructions (Signed)
Increase warfarin dose today to one 5 mg tablet then increase weekly dose to 1/2 tablet daily except take no warfarin on Sundays. Recheck INR in one week.

## 2022-03-30 NOTE — Assessment & Plan Note (Signed)
20+ lb weight loss in the past 6 weeks, pt has been trying.  Pending labs (CBC, BMP) then will determine need for medication tapering/titration.

## 2022-03-30 NOTE — Assessment & Plan Note (Signed)
Severe, pending pulm sleep study.

## 2022-03-30 NOTE — Assessment & Plan Note (Addendum)
Significant improvement after 2 units pRBC blood transfusion at ER 02/23/2022.  Has heme f/u scheduled for end of this month - may benefit from repeat iron infusion. In interim, start oral iron MWF dosing. Previous difficulty tolerating oral iron due to constipation.

## 2022-03-30 NOTE — Assessment & Plan Note (Signed)
He continues breztri. Overdue for further pulm eval with sleep study and PFTs - advised call to reschedule (had to cancel due to transportoation issues.

## 2022-03-30 NOTE — Assessment & Plan Note (Signed)
Discussed weight loss noted over the past 6 weeks.  Pending labwork to ensure kidney function tolerating diuretic regimen.  Did suggest he drop dose of metolazone to 2.'5mg'$  once weekly.

## 2022-03-30 NOTE — Assessment & Plan Note (Addendum)
Better after ER blood transfusions x2.  Needs to f/u with pulmonology for sleep study and PFTs.

## 2022-03-30 NOTE — Assessment & Plan Note (Addendum)
Pending CBC, BMP through home health collected last week. Will await labwork . He had 2 units of pRBC while in ER.  He has hematology f/u planned for the end of the month . Will start oral iron BID in the interim.

## 2022-03-30 NOTE — Assessment & Plan Note (Addendum)
Anticipate improving control with recent weight loss. Reassess next labwork.  Due for eye exam -encouraged he schedule this

## 2022-03-31 ENCOUNTER — Ambulatory Visit: Payer: Medicare Other | Admitting: Family

## 2022-03-31 DIAGNOSIS — E785 Hyperlipidemia, unspecified: Secondary | ICD-10-CM | POA: Diagnosis not present

## 2022-03-31 DIAGNOSIS — Z5181 Encounter for therapeutic drug level monitoring: Secondary | ICD-10-CM | POA: Diagnosis not present

## 2022-03-31 DIAGNOSIS — M1990 Unspecified osteoarthritis, unspecified site: Secondary | ICD-10-CM | POA: Diagnosis not present

## 2022-03-31 DIAGNOSIS — G4733 Obstructive sleep apnea (adult) (pediatric): Secondary | ICD-10-CM | POA: Diagnosis not present

## 2022-03-31 DIAGNOSIS — I110 Hypertensive heart disease with heart failure: Secondary | ICD-10-CM | POA: Diagnosis not present

## 2022-03-31 DIAGNOSIS — I4891 Unspecified atrial fibrillation: Secondary | ICD-10-CM | POA: Diagnosis not present

## 2022-03-31 DIAGNOSIS — Z9981 Dependence on supplemental oxygen: Secondary | ICD-10-CM | POA: Diagnosis not present

## 2022-03-31 DIAGNOSIS — K219 Gastro-esophageal reflux disease without esophagitis: Secondary | ICD-10-CM | POA: Diagnosis not present

## 2022-03-31 DIAGNOSIS — I252 Old myocardial infarction: Secondary | ICD-10-CM | POA: Diagnosis not present

## 2022-03-31 DIAGNOSIS — Z87891 Personal history of nicotine dependence: Secondary | ICD-10-CM | POA: Diagnosis not present

## 2022-03-31 DIAGNOSIS — E876 Hypokalemia: Secondary | ICD-10-CM | POA: Diagnosis not present

## 2022-03-31 DIAGNOSIS — I25119 Atherosclerotic heart disease of native coronary artery with unspecified angina pectoris: Secondary | ICD-10-CM | POA: Diagnosis not present

## 2022-03-31 DIAGNOSIS — Z7984 Long term (current) use of oral hypoglycemic drugs: Secondary | ICD-10-CM | POA: Diagnosis not present

## 2022-03-31 DIAGNOSIS — F32A Depression, unspecified: Secondary | ICD-10-CM | POA: Diagnosis not present

## 2022-03-31 DIAGNOSIS — I5042 Chronic combined systolic (congestive) and diastolic (congestive) heart failure: Secondary | ICD-10-CM | POA: Diagnosis not present

## 2022-03-31 DIAGNOSIS — I872 Venous insufficiency (chronic) (peripheral): Secondary | ICD-10-CM | POA: Diagnosis not present

## 2022-03-31 DIAGNOSIS — G43909 Migraine, unspecified, not intractable, without status migrainosus: Secondary | ICD-10-CM | POA: Diagnosis not present

## 2022-03-31 DIAGNOSIS — Z7901 Long term (current) use of anticoagulants: Secondary | ICD-10-CM | POA: Diagnosis not present

## 2022-03-31 DIAGNOSIS — J441 Chronic obstructive pulmonary disease with (acute) exacerbation: Secondary | ICD-10-CM | POA: Diagnosis not present

## 2022-03-31 DIAGNOSIS — E1165 Type 2 diabetes mellitus with hyperglycemia: Secondary | ICD-10-CM | POA: Diagnosis not present

## 2022-03-31 DIAGNOSIS — I420 Dilated cardiomyopathy: Secondary | ICD-10-CM | POA: Diagnosis not present

## 2022-03-31 DIAGNOSIS — Z9181 History of falling: Secondary | ICD-10-CM | POA: Diagnosis not present

## 2022-04-01 ENCOUNTER — Ambulatory Visit (INDEPENDENT_AMBULATORY_CARE_PROVIDER_SITE_OTHER): Payer: Medicare Other

## 2022-04-01 DIAGNOSIS — F32A Depression, unspecified: Secondary | ICD-10-CM | POA: Diagnosis not present

## 2022-04-01 DIAGNOSIS — Z87891 Personal history of nicotine dependence: Secondary | ICD-10-CM | POA: Diagnosis not present

## 2022-04-01 DIAGNOSIS — Z7984 Long term (current) use of oral hypoglycemic drugs: Secondary | ICD-10-CM | POA: Diagnosis not present

## 2022-04-01 DIAGNOSIS — Z9981 Dependence on supplemental oxygen: Secondary | ICD-10-CM | POA: Diagnosis not present

## 2022-04-01 DIAGNOSIS — E876 Hypokalemia: Secondary | ICD-10-CM | POA: Diagnosis not present

## 2022-04-01 DIAGNOSIS — E1165 Type 2 diabetes mellitus with hyperglycemia: Secondary | ICD-10-CM | POA: Diagnosis not present

## 2022-04-01 DIAGNOSIS — I25119 Atherosclerotic heart disease of native coronary artery with unspecified angina pectoris: Secondary | ICD-10-CM | POA: Diagnosis not present

## 2022-04-01 DIAGNOSIS — I5042 Chronic combined systolic (congestive) and diastolic (congestive) heart failure: Secondary | ICD-10-CM | POA: Diagnosis not present

## 2022-04-01 DIAGNOSIS — G4733 Obstructive sleep apnea (adult) (pediatric): Secondary | ICD-10-CM | POA: Diagnosis not present

## 2022-04-01 DIAGNOSIS — I872 Venous insufficiency (chronic) (peripheral): Secondary | ICD-10-CM | POA: Diagnosis not present

## 2022-04-01 DIAGNOSIS — I110 Hypertensive heart disease with heart failure: Secondary | ICD-10-CM | POA: Diagnosis not present

## 2022-04-01 DIAGNOSIS — M1990 Unspecified osteoarthritis, unspecified site: Secondary | ICD-10-CM | POA: Diagnosis not present

## 2022-04-01 DIAGNOSIS — I252 Old myocardial infarction: Secondary | ICD-10-CM | POA: Diagnosis not present

## 2022-04-01 DIAGNOSIS — G43909 Migraine, unspecified, not intractable, without status migrainosus: Secondary | ICD-10-CM | POA: Diagnosis not present

## 2022-04-01 DIAGNOSIS — J441 Chronic obstructive pulmonary disease with (acute) exacerbation: Secondary | ICD-10-CM | POA: Diagnosis not present

## 2022-04-01 DIAGNOSIS — E785 Hyperlipidemia, unspecified: Secondary | ICD-10-CM | POA: Diagnosis not present

## 2022-04-01 DIAGNOSIS — Z5181 Encounter for therapeutic drug level monitoring: Secondary | ICD-10-CM | POA: Diagnosis not present

## 2022-04-01 DIAGNOSIS — I4891 Unspecified atrial fibrillation: Secondary | ICD-10-CM | POA: Diagnosis not present

## 2022-04-01 DIAGNOSIS — Z7901 Long term (current) use of anticoagulants: Secondary | ICD-10-CM

## 2022-04-01 DIAGNOSIS — K219 Gastro-esophageal reflux disease without esophagitis: Secondary | ICD-10-CM | POA: Diagnosis not present

## 2022-04-01 DIAGNOSIS — I420 Dilated cardiomyopathy: Secondary | ICD-10-CM | POA: Diagnosis not present

## 2022-04-01 DIAGNOSIS — Z9181 History of falling: Secondary | ICD-10-CM | POA: Diagnosis not present

## 2022-04-01 LAB — POCT INR: INR: 2.1 (ref 2.0–3.0)

## 2022-04-01 NOTE — Progress Notes (Signed)
[name] from [facility] called with INR result of 2.1.  Continue to take 1/2 tablet daily except take no warfarin on Sundays. Recheck INR in one week. Instructions given to [name] and pt, both verbalize understanding.

## 2022-04-01 NOTE — Patient Instructions (Signed)
Continue to take 1/2 tablet daily except take no warfarin on Sundays. Recheck INR in one week. Instructions given to [name] and pt, both verbalize understanding.

## 2022-04-02 ENCOUNTER — Ambulatory Visit (INDEPENDENT_AMBULATORY_CARE_PROVIDER_SITE_OTHER): Payer: Medicare Other | Admitting: Family Medicine

## 2022-04-02 ENCOUNTER — Encounter: Payer: Self-pay | Admitting: Family Medicine

## 2022-04-02 VITALS — BP 132/68 | HR 68 | Temp 97.3°F | Ht 71.0 in | Wt 238.6 lb

## 2022-04-02 DIAGNOSIS — J9612 Chronic respiratory failure with hypercapnia: Secondary | ICD-10-CM

## 2022-04-02 DIAGNOSIS — E669 Obesity, unspecified: Secondary | ICD-10-CM

## 2022-04-02 DIAGNOSIS — R0609 Other forms of dyspnea: Secondary | ICD-10-CM | POA: Diagnosis not present

## 2022-04-02 DIAGNOSIS — R5383 Other fatigue: Secondary | ICD-10-CM

## 2022-04-02 DIAGNOSIS — H538 Other visual disturbances: Secondary | ICD-10-CM | POA: Diagnosis not present

## 2022-04-02 DIAGNOSIS — I5032 Chronic diastolic (congestive) heart failure: Secondary | ICD-10-CM

## 2022-04-02 DIAGNOSIS — Z7689 Persons encountering health services in other specified circumstances: Secondary | ICD-10-CM | POA: Diagnosis not present

## 2022-04-02 DIAGNOSIS — D508 Other iron deficiency anemias: Secondary | ICD-10-CM | POA: Diagnosis not present

## 2022-04-02 DIAGNOSIS — I2510 Atherosclerotic heart disease of native coronary artery without angina pectoris: Secondary | ICD-10-CM | POA: Diagnosis not present

## 2022-04-02 DIAGNOSIS — G4733 Obstructive sleep apnea (adult) (pediatric): Secondary | ICD-10-CM

## 2022-04-02 DIAGNOSIS — J432 Centrilobular emphysema: Secondary | ICD-10-CM

## 2022-04-02 DIAGNOSIS — K633 Ulcer of intestine: Secondary | ICD-10-CM

## 2022-04-02 DIAGNOSIS — I4819 Other persistent atrial fibrillation: Secondary | ICD-10-CM | POA: Diagnosis not present

## 2022-04-02 DIAGNOSIS — Z7901 Long term (current) use of anticoagulants: Secondary | ICD-10-CM | POA: Diagnosis not present

## 2022-04-02 DIAGNOSIS — J9611 Chronic respiratory failure with hypoxia: Secondary | ICD-10-CM

## 2022-04-02 DIAGNOSIS — D649 Anemia, unspecified: Secondary | ICD-10-CM

## 2022-04-02 DIAGNOSIS — R5381 Other malaise: Secondary | ICD-10-CM | POA: Diagnosis not present

## 2022-04-02 DIAGNOSIS — I50810 Right heart failure, unspecified: Secondary | ICD-10-CM

## 2022-04-02 LAB — CBC WITH DIFFERENTIAL/PLATELET
Basophils Absolute: 0.1 10*3/uL (ref 0.0–0.1)
Basophils Relative: 0.6 % (ref 0.0–3.0)
Eosinophils Absolute: 0.2 10*3/uL (ref 0.0–0.7)
Eosinophils Relative: 2.1 % (ref 0.0–5.0)
HCT: 28.2 % — ABNORMAL LOW (ref 39.0–52.0)
Hemoglobin: 8.4 g/dL — ABNORMAL LOW (ref 13.0–17.0)
Lymphocytes Relative: 15.2 % (ref 12.0–46.0)
Lymphs Abs: 1.5 10*3/uL (ref 0.7–4.0)
MCHC: 29.9 g/dL — ABNORMAL LOW (ref 30.0–36.0)
MCV: 66.4 fl — ABNORMAL LOW (ref 78.0–100.0)
Monocytes Absolute: 1 10*3/uL (ref 0.1–1.0)
Monocytes Relative: 10.4 % (ref 3.0–12.0)
Neutro Abs: 7.2 10*3/uL (ref 1.4–7.7)
Neutrophils Relative %: 71.7 % (ref 43.0–77.0)
Platelets: 219 10*3/uL (ref 150.0–400.0)
RBC: 4.24 Mil/uL (ref 4.22–5.81)
RDW: 24.2 % — ABNORMAL HIGH (ref 11.5–15.5)
WBC: 10 10*3/uL (ref 4.0–10.5)

## 2022-04-02 LAB — IBC PANEL
Iron: 20 ug/dL — ABNORMAL LOW (ref 42–165)
Saturation Ratios: 4.3 % — ABNORMAL LOW (ref 20.0–50.0)
TIBC: 462 ug/dL — ABNORMAL HIGH (ref 250.0–450.0)
Transferrin: 330 mg/dL (ref 212.0–360.0)

## 2022-04-02 LAB — FERRITIN: Ferritin: 7.7 ng/mL — ABNORMAL LOW (ref 22.0–322.0)

## 2022-04-02 NOTE — Patient Instructions (Addendum)
Try daily flonase and let us know how this helps R sided sinus symptoms.  Ambulatory pulse ox today.  We will send in request for power scooter.  Good to see you today.  Keep follow up appointment next month.

## 2022-04-02 NOTE — Progress Notes (Unsigned)
Patient ID: [name], male    DOB: 1955-08-18, 66 y.o.   MRN: [id number]  This visit was conducted in person.  BP 132/68   Pulse 68   Temp (!) 97.3 F (36.3 C) (Temporal)   Ht _0  (1.803 m)   Wt 238 lb 9.6 oz (108.2 kg)   SpO2 96%   BMI 33.28 kg/m    CC: mobility assessment, anemia follow up  Subjective:   HPI: [name] is a 66 y.o. male presenting on 04/02/2022 for Follow-up (Here for 6 wk f/u for mobility assessment. )   Recent ER visit for symptomatic anemia in setting of supra-therapeutic INR and subsequent GI bleed. He retried oral iron MWF but stopped due to constipation, pending hematology appointment 04/23/2022 with Dr Janese Banks. He has GI follow up scheduled for 06/2022.   H/o 2 u pRBC blood transfusion at ER 02/23/2022 H/o 4 iron infusions, latest 10/2021.  [facility] now involved - PT, SN.   Recent labs (home draw) returned showing WBC 13, Hgb 95, plt 407, MWV 69, glu 98, Cr 1.22, eGFR 65, Na 139, K 3.9, Cl 82, CO2 37.   COPD/OSA - saw pulm, needs to schedule PFTs, sleep study.  Ongoing transportation limitations. Still doesn't have car - wants to wait to get car back before he gets scheduled for above.   Noticing vision changes - due to schedule eye exam.  Notes R sided sinus pressure headache despite taking allergy medications regularly including singulair, PRN flonase. No significant congestion.   No fevers/chills, diarrhea, cough, skin infection, dysuria.   Currently using supplemental oxygen through tank at home. Requests inogen oxygen concentrator. He uses oxygen every night as well as during the day when resting. Notes easy dyspnea with minimal exertion.    Here for mobility assessment.  Mobility device requested predominantly for at home use. Mobility limited due to: unsteadiness, impaired equilibrium and dizziness and leg weakness. Mobility limitation isn't sufficiently or safely resolved by using a cane or walker: easy tiring out if  walking more than 20 ft  Lacks the hand/arm strength to operate a manual wheelchair: yes He can safely transfer to and from mobility device. He can operate tiller steering system. He can maintain postural stability and position while operating mobility device at home.  He has sufficient mental and physical ability to safely operate mobility device at home.  Home allows adequate access between rooms, maneuvering space and surfaces: yes  Using mobility device will significantly improve ability to participate in MRADLs, and he will use it at home. He hasn't expressed unwillingness to use this at home.  He would use power mobility device to get to bathroom, to get to kitchen, to prepare meals.       Relevant past medical, surgical, family and social history reviewed and updated as indicated. Interim medical history since our last visit reviewed. Allergies and medications reviewed and updated. Outpatient Medications Prior to Visit  Medication Sig Dispense Refill   ACCU-CHEK FASTCLIX LANCETS MISC Check blood sugar once daily and as instructed. Dx 250.00 100 each 3   albuterol (VENTOLIN HFA) 108 (90 Base) MCG/ACT inhaler Inhale 2 puffs into the lungs every 6 (six) hours as needed for wheezing or shortness of breath. 8.5 each 6   amiodarone (PACERONE) 200 MG tablet Take 1 tablet (200 mg total) by mouth daily. 90 tablet 3   Blood Glucose Monitoring Suppl (BLOOD GLUCOSE MONITOR SYSTEM) W/DEVICE KIT by Does not apply route. Use to  check sugar once daily and as needed Dx: E11.9 **ONE TOUCH VERIO**     Budeson-Glycopyrrol-Formoterol (BREZTRI AEROSPHERE) 160-9-4.8 MCG/ACT AERO Inhale 2 puffs into the lungs 2 (two) times daily. 32.1 g 3   carvedilol (COREG) 6.25 MG tablet TAKE 1 TABLET BY MOUTH TWICE A DAY WITH FOOD 180 tablet 0   Cholecalciferol (VITAMIN D) 50 MCG (2000 UT) CAPS Take 1 capsule (2,000 Units total) by mouth daily. 30 capsule    dapagliflozin propanediol (FARXIGA) 10 MG TABS tablet Take 1 tablet  (10 mg total) by mouth daily before breakfast. 90 tablet 3   Docusate Calcium (STOOL SOFTENER PO) Take 1 tablet by mouth daily.     FLUoxetine (PROZAC) 40 MG capsule Take 1 capsule (40 mg total) by mouth daily. 30 capsule 6   fluticasone (FLONASE) 50 MCG/ACT nasal spray Place 2 sprays into both nostrils daily. 48 mL 3   furosemide (LASIX) 80 MG tablet Take 80 mg by mouth 2 (two) times daily.     glimepiride (AMARYL) 4 MG tablet Take 1 tablet (4 mg total) by mouth daily with breakfast. 90 tablet 1   glucose blood (ONETOUCH VERIO) test strip Check blood sugar 3 times a day 300 strip 3   ipratropium-albuterol (DUONEB) 0.5-2.5 (3) MG/3ML SOLN TAKE 3 MLS BY NEBULIZATION EVERY 6 (SIX) HOURS AS NEEDED (SEVERE SHORTNESS OF BREATH/WHEEZING). 360 mL 0   metFORMIN (GLUCOPHAGE) 1000 MG tablet Take 1 tablet (1,000 mg total) by mouth 2 (two) times daily with a meal. 180 tablet 1   montelukast (SINGULAIR) 10 MG tablet TAKE 1 TABLET BY MOUTH EVERYDAY AT BEDTIME 90 tablet 3   pantoprazole (PROTONIX) 40 MG tablet TAKE 1 TABLET (40 MG TOTAL) BY MOUTH TWICE A DAY BEFORE MEALS 180 tablet 0   potassium chloride (KLOR-CON M) 10 MEQ tablet Take 1 tablet (10 mEq total) by mouth daily. And an extra one on the days you take metolazone 38 tablet 5   Probiotic Product (PROBIOTIC DAILY PO) Take 1 tablet by mouth daily.     Saw Palmetto 450 MG CAPS Take 1 capsule (450 mg total) by mouth daily.  0   simvastatin (ZOCOR) 20 MG tablet TAKE 1 TABLET BY MOUTH EVERY DAY IN THE EVENING 90 tablet 0   Turmeric (QC TUMERIC COMPLEX PO) Take by mouth.     vitamin B-12 (CYANOCOBALAMIN) 1000 MCG tablet Take 1 tablet (1,000 mcg total) by mouth every Monday, Wednesday, and Friday.     warfarin (COUMADIN) 5 MG tablet TAKE 1/2 TABLET BY MOUTH DAILY EXCEPT TAKE NO COUMADIN ON SUNDAY OR AS DIRECTED BY ANTICOAGULATION CLINIC 60 tablet 1   metolazone (ZAROXOLYN) 2.5 MG tablet Take 1 tablet (2.5 mg total) by mouth 2 (two) times a week. Saturday and  wednesday 8 tablet 5   metolazone (ZAROXOLYN) 2.5 MG tablet Take 1 tablet (2.5 mg total) by mouth once a week.     Iron, Ferrous Sulfate, 325 (65 Fe) MG TABS Take 325 mg by mouth every Monday, Wednesday, and Friday.     No facility-administered medications prior to visit.     Per HPI unless specifically indicated in ROS section below Review of Systems  Objective:  BP 132/68   Pulse 68   Temp (!) 97.3 F (36.3 C) (Temporal)   Ht _0  (1.803 m)   Wt 238 lb 9.6 oz (108.2 kg)   SpO2 96%   BMI 33.28 kg/m   Wt Readings from Last 3 Encounters:  04/02/22 238 lb 9.6 oz (108.2  kg)  03/29/22 238 lb 12.8 oz (108.3 kg)  02/25/22 264 lb (119.7 kg)      Physical Exam Vitals and nursing note reviewed.  Constitutional:      Appearance: Normal appearance. He is obese. He is ill-appearing (chronic).     Comments: Kyphosis present  HENT:     Head: Normocephalic and atraumatic.     Mouth/Throat:     Mouth: Mucous membranes are moist.     Pharynx: Oropharynx is clear. No oropharyngeal exudate or posterior oropharyngeal erythema.  Eyes:     Extraocular Movements: Extraocular movements intact.     Pupils: Pupils are equal, round, and reactive to light.  Cardiovascular:     Rate and Rhythm: Normal rate and regular rhythm.     Pulses: Normal pulses.     Heart sounds: Normal heart sounds. No murmur heard. Pulmonary:     Effort: Pulmonary effort is normal. No respiratory distress.     Breath sounds: Normal breath sounds. No wheezing, rhonchi or rales.  Musculoskeletal:     Right lower leg: No edema.     Left lower leg: No edema.  Skin:    General: Skin is warm and dry.     Findings: Bruising present. No rash.  Neurological:     Mental Status: He is alert.     Sensory: Sensation is intact.     Coordination: Coordination is intact. Romberg sign negative.     Gait: Gait is intact.     Comments:  CN grossly intact 5/5 strength BUE Grip strength intact 4/5 strength hip flexors and knee  extenders otherwise 5/5 strength BLE  Psychiatric:        Mood and Affect: Mood normal.        Behavior: Behavior normal.       Results for orders placed or performed in visit on 04/02/22  Ferritin  Result Value Ref Range   Ferritin 7.7 (L) 22.0 - 322.0 ng/mL  IBC panel  Result Value Ref Range   Iron 20 (L) 42 - 165 ug/dL   Transferrin 330.0 212.0 - 360.0 mg/dL   Saturation Ratios 4.3 (L) 20.0 - 50.0 %   TIBC 462.0 (H) 250.0 - 450.0 mcg/dL  CBC with Differential/Platelet  Result Value Ref Range   WBC 10.0 4.0 - 10.5 K/uL   RBC 4.24 4.22 - 5.81 Mil/uL   Hemoglobin 8.4 Repeated and verified X2. (L) 13.0 - 17.0 g/dL   HCT 28.2 (L) 39.0 - 52.0 %   MCV 66.4 Repeated and verified X2. (L) 78.0 - 100.0 fl   MCHC 29.9 (L) 30.0 - 36.0 g/dL   RDW 24.2 (H) 11.5 - 15.5 %   Platelets 219.0 150.0 - 400.0 K/uL   Neutrophils Relative % 71.7 43.0 - 77.0 %   Lymphocytes Relative 15.2 12.0 - 46.0 %   Monocytes Relative 10.4 3.0 - 12.0 %   Eosinophils Relative 2.1 0.0 - 5.0 %   Basophils Relative 0.6 0.0 - 3.0 %   Neutro Abs 7.2 1.4 - 7.7 K/uL   Lymphs Abs 1.5 0.7 - 4.0 K/uL   Monocytes Absolute 1.0 0.1 - 1.0 K/uL   Eosinophils Absolute 0.2 0.0 - 0.7 K/uL   Basophils Absolute 0.1 0.0 - 0.1 K/uL   Lab Results  Component Value Date   CREATININE 1.2 03/25/2022   BUN 15 02/22/2022   NA 139 03/25/2022   K 3.9 03/25/2022   CL 82 (A) 03/25/2022   CO2 37 (A) 03/25/2022    Assessment &  Plan:   Problem List Items Addressed This Visit       Unprioritized   Encounter for power mobility device assessment - Primary (Chronic)    Candidate for power vehicle consideration due to unsteady ambulation, and marked exertional dyspnea with chronic hypoxic and hypercapneic respiratory failure in h/o COPD, CHF, anemia.  Do recommend power operated vehicle - likely scooter. Will forward info to DME/HH for assistance.       Obstructive sleep apnea    Needs to schedule sleep study. Lack of transportation  currently limiting scheduling.       Obesity, Class I, BMI 30.0-34.9 (see actual BMI)   Coronary artery disease   Relevant Medications   metolazone (ZAROXOLYN) 2.5 MG tablet   Malaise and fatigue   Exertional dyspnea    Returning along with fatigue and leg weakness could be sign of worsening anemia again - repeat CBC, iron panel.       Chronic diastolic heart failure (HCC)    Stable period on lasix 34m BID, farxiga 189mdaily, metolazone 2.52m1meekly- continue this, seemingly euvolemic. Appreciate CHF clinic care.       Relevant Medications   metolazone (ZAROXOLYN) 2.5 MG tablet   COPD (chronic obstructive pulmonary disease) (HCC)   Iron deficiency anemia    S/p 2u pRBC in ER 02/23/2022 with improvement in anemia symptoms (exertional dyspnea, dizziness, weakness. Now notes symptoms are returning. He continues coumadin for afib. Has had several iron infusions latest 10/2021 and did feel better after this. He has not tolerated oral iron (nausea, constipation), even MWF dosing.  He has hematology follow up at end of month, GI f/u 06/2022. Will see if this can be expedited.       Relevant Orders   Ferritin (Completed)   IBC panel (Completed)   CBC with Differential/Platelet (Completed)   Ulceration of intestine    H/o colon ulcer on EGD/colonoscopy 04/2020, planned capsule endoscopy but lost to f/u in interim.  Has GI appt 06/2022 - will see if this can be expedited.       Atrial fibrillation (HCC)    Continues amiodarone 200m24mily along with coumadin      Relevant Medications   metolazone (ZAROXOLYN) 2.5 MG tablet   Chronic respiratory failure with hypoxia and hypercapnia (HCC)    Maintains oxygenation at 90% with ambulation on RA. Ok to stay off oxygen during day, but keep nocturnal supplemental oxygen use.       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       Symptomatic anemia   Long-term (current) use of anticoagulants, INR goal 2.0-3.0     No orders of the defined types were placed in this encounter.  Orders Placed This Encounter  Procedures   Ferritin   IBC panel   CBC with Differential/Platelet     Patient Instructions  Try daily flonase and let us kKoreaw how this helps R sided sinus symptoms.  Ambulatory pulse ox today.  We will send in request for power scooter.  Good to see you today.  Keep follow up appointment next month.   Follow up plan: Return if symptoms worsen or fail to improve.  JaviRia Bush

## 2022-04-03 ENCOUNTER — Encounter: Payer: Self-pay | Admitting: Family Medicine

## 2022-04-03 DIAGNOSIS — Z7689 Persons encountering health services in other specified circumstances: Secondary | ICD-10-CM | POA: Insufficient documentation

## 2022-04-03 NOTE — Assessment & Plan Note (Signed)
Stable period on lasix '80mg'$  BID, farxiga '10mg'$  daily, metolazone 2.'5mg'$  weekly- continue this, seemingly euvolemic. Appreciate CHF clinic care.

## 2022-04-03 NOTE — Assessment & Plan Note (Addendum)
Needs to schedule sleep study. Lack of transportation currently limiting scheduling.

## 2022-04-03 NOTE — Assessment & Plan Note (Signed)
Maintains oxygenation at 90% with ambulation on RA. Ok to stay off oxygen during day, but keep nocturnal supplemental oxygen use.

## 2022-04-03 NOTE — Assessment & Plan Note (Signed)
Returning along with fatigue and leg weakness could be sign of worsening anemia again - repeat CBC, iron panel.

## 2022-04-03 NOTE — Assessment & Plan Note (Addendum)
S/p 2u pRBC in ER 02/23/2022 with improvement in anemia symptoms (exertional dyspnea, dizziness, weakness. Now notes symptoms are returning. He continues coumadin for afib. Has had several iron infusions latest 10/2021 and did feel better after this. He has not tolerated oral iron (nausea, constipation), even MWF dosing.  He has hematology follow up at end of month, GI f/u 06/2022. Will see if this can be expedited.

## 2022-04-03 NOTE — Assessment & Plan Note (Signed)
Continues amiodarone '200mg'$  daily along with coumadin

## 2022-04-03 NOTE — Assessment & Plan Note (Signed)
Eye doctor referral placed last visit - still needs to schedule this.

## 2022-04-03 NOTE — Assessment & Plan Note (Signed)
Candidate for power vehicle consideration due to unsteady ambulation, and marked exertional dyspnea with chronic hypoxic and hypercapneic respiratory failure in h/o COPD, CHF, anemia.  Do recommend power operated vehicle - likely scooter. Will forward info to DME/HH for assistance.

## 2022-04-03 NOTE — Assessment & Plan Note (Addendum)
H/o colon ulcer on EGD/colonoscopy 04/2020, planned capsule endoscopy but lost to f/u in interim.  Has GI appt 06/2022 - will see if this can be expedited.

## 2022-04-05 ENCOUNTER — Telehealth: Payer: Self-pay

## 2022-04-05 ENCOUNTER — Telehealth: Payer: Self-pay | Admitting: Family Medicine

## 2022-04-05 NOTE — Telephone Encounter (Signed)
Scheduled patient for 04/08/2022 with Dr. Marius Ditch

## 2022-04-05 NOTE — Telephone Encounter (Signed)
-----   Message from Lin Landsman, MD sent at 04/05/2022  8:10 AM EST ----- Regarding: RE: coordinating care Sure,   Caryl Pina  I can see him on Thursday this week  Thanks RV ----- Message ----- From: [name], MD Sent: 04/03/2022  12:59 PM EST To: Lin Landsman, MD; Sindy Guadeloupe, MD Subject: coordinating care                              Good afternoon Dr Janese Banks and Marius Ditch,  I hope you're having a nice weekend. I wanted to touch base about this mutual patient with significant social difficulties including transportation limitations leading to several missed and cancelled appointments. Our care coordination team is working with him on this.   He has upcoming appointments with Dr Janese Banks on 04/23/2022 and Dr Marius Ditch on 07/21/2022.   He has history of atrial fibrillation on coumadin, HFpEF, COPD, CAD, untreated OSA, and colonic ulcer on prior colonoscopy 04/2020. Plan at that time was capsule endoscopy but he was lost to GI follow up. He's most recently had symptomatic iron deficiency anemia s/p several iron infusions this year latest 10/2021 and 2 units blood transfusion through ER 02/23/2022. His hemoglobin nadir was 6.7, now recently he's again dropping from 9s to 8s. He's been unable to tolerate oral iron.   Unfortunately I've been told I cannot order iron infusions through [facility] and he's unable to drive out to the [facility] in [place].  I wanted to see if Dr Marius Ditch Fabienne Bruns would be able to see patient sooner than March, and if Dr Janese Banks would consider repeating iron infusion in the interim.   Thanks for your consideration! -[name]

## 2022-04-05 NOTE — Telephone Encounter (Signed)
Called and left a message for call back  

## 2022-04-05 NOTE — Telephone Encounter (Signed)
Pt called stating he was referred to have a sleep study by Dr. Darnell Level & he wasn't going to be able to make it due to transportation issues. Pt stated ov was scheduled for 04/07/22 but I couldn't see any ov scheduled for that specific day. Pt is asking for office information so he can cancelled the appt? Call back # [phone]

## 2022-04-06 ENCOUNTER — Telehealth: Payer: Self-pay

## 2022-04-06 DIAGNOSIS — Z9181 History of falling: Secondary | ICD-10-CM | POA: Diagnosis not present

## 2022-04-06 DIAGNOSIS — I110 Hypertensive heart disease with heart failure: Secondary | ICD-10-CM | POA: Diagnosis not present

## 2022-04-06 DIAGNOSIS — F32A Depression, unspecified: Secondary | ICD-10-CM | POA: Diagnosis not present

## 2022-04-06 DIAGNOSIS — E1165 Type 2 diabetes mellitus with hyperglycemia: Secondary | ICD-10-CM | POA: Diagnosis not present

## 2022-04-06 DIAGNOSIS — Z5181 Encounter for therapeutic drug level monitoring: Secondary | ICD-10-CM | POA: Diagnosis not present

## 2022-04-06 DIAGNOSIS — I252 Old myocardial infarction: Secondary | ICD-10-CM | POA: Diagnosis not present

## 2022-04-06 DIAGNOSIS — Z87891 Personal history of nicotine dependence: Secondary | ICD-10-CM | POA: Diagnosis not present

## 2022-04-06 DIAGNOSIS — E785 Hyperlipidemia, unspecified: Secondary | ICD-10-CM | POA: Diagnosis not present

## 2022-04-06 DIAGNOSIS — M1990 Unspecified osteoarthritis, unspecified site: Secondary | ICD-10-CM | POA: Diagnosis not present

## 2022-04-06 DIAGNOSIS — I872 Venous insufficiency (chronic) (peripheral): Secondary | ICD-10-CM | POA: Diagnosis not present

## 2022-04-06 DIAGNOSIS — Z7984 Long term (current) use of oral hypoglycemic drugs: Secondary | ICD-10-CM | POA: Diagnosis not present

## 2022-04-06 DIAGNOSIS — G43909 Migraine, unspecified, not intractable, without status migrainosus: Secondary | ICD-10-CM | POA: Diagnosis not present

## 2022-04-06 DIAGNOSIS — I25119 Atherosclerotic heart disease of native coronary artery with unspecified angina pectoris: Secondary | ICD-10-CM | POA: Diagnosis not present

## 2022-04-06 DIAGNOSIS — I420 Dilated cardiomyopathy: Secondary | ICD-10-CM | POA: Diagnosis not present

## 2022-04-06 DIAGNOSIS — J441 Chronic obstructive pulmonary disease with (acute) exacerbation: Secondary | ICD-10-CM | POA: Diagnosis not present

## 2022-04-06 DIAGNOSIS — G4733 Obstructive sleep apnea (adult) (pediatric): Secondary | ICD-10-CM | POA: Diagnosis not present

## 2022-04-06 DIAGNOSIS — Z7901 Long term (current) use of anticoagulants: Secondary | ICD-10-CM | POA: Diagnosis not present

## 2022-04-06 DIAGNOSIS — I4891 Unspecified atrial fibrillation: Secondary | ICD-10-CM | POA: Diagnosis not present

## 2022-04-06 DIAGNOSIS — I5042 Chronic combined systolic (congestive) and diastolic (congestive) heart failure: Secondary | ICD-10-CM | POA: Diagnosis not present

## 2022-04-06 DIAGNOSIS — Z9981 Dependence on supplemental oxygen: Secondary | ICD-10-CM | POA: Diagnosis not present

## 2022-04-06 DIAGNOSIS — K219 Gastro-esophageal reflux disease without esophagitis: Secondary | ICD-10-CM | POA: Diagnosis not present

## 2022-04-06 DIAGNOSIS — E876 Hypokalemia: Secondary | ICD-10-CM | POA: Diagnosis not present

## 2022-04-06 NOTE — Telephone Encounter (Signed)
Lvm asking pt to call back. Need to know name of company, their phn #, fax # and what info they need for power mobility device.

## 2022-04-06 NOTE — Telephone Encounter (Signed)
Rtn pt's call.  States he has phn # to try and r/s sleep study.  Expresses his thanks for call back.

## 2022-04-06 NOTE — Telephone Encounter (Signed)
[name], [facility] RN, called to report she is at pt's home to test INR and he reports pt was told by PCP to hold warfarin.   Assessing chart, a note under results reports    [name], MD 04/03/2022 12:32 PM EST     Plz notify iron levels remain very low, anemia is again worse from 9.5 to 8.4. Recommend he hold warfarin for now until GI evaluation. I will reach out to GI and hematology to try and expedite appointments. Will ask care coordinator to continue working on transportation.   Recommend he continue pantoprazole '40mg'$  BID, let us know if needs refill of this. He passed walking oxygen test - he can use oxygen only PRN shortness of breath during the day but continue using nightly.     Pt stopped warfarin on 12/11 but did not start aspirin yet because he has not been able to make it to pick it up. Pt has apt with GI on 12/14 and cardiology on 12/15. Pt has had symptomatic anemia and was in hospital and received blood transfusion x 2 for anemia.   Advised [name] no INR check is needed today but will f/u next week after pt has apts with GI and cardiology. [name] verbalized understanding.

## 2022-04-07 ENCOUNTER — Telehealth: Payer: Self-pay

## 2022-04-07 NOTE — Telephone Encounter (Signed)
Lvm asking pt to call back. Need to know name of company, their phn #, fax # and what info they need for power mobility device.

## 2022-04-07 NOTE — Patient Outreach (Signed)
  Care Coordination   04/07/2022 Name: [name] MRN: [id number] DOB: [birth year]   Care Coordination Outreach Attempts:  An unsuccessful telephone outreach was attempted today to offer the patient information about available care coordination services as a benefit of their health plan.   Follow Up Plan:  Additional outreach attempts will be made to offer the patient care coordination information and services.   Encounter Outcome:  No Answer   Care Coordination Interventions:  No, not indicated    Quinn Plowman [facility] [place] [phone] direct line

## 2022-04-08 ENCOUNTER — Telehealth: Payer: Self-pay

## 2022-04-08 ENCOUNTER — Other Ambulatory Visit: Payer: Self-pay | Admitting: Gastroenterology

## 2022-04-08 ENCOUNTER — Ambulatory Visit (INDEPENDENT_AMBULATORY_CARE_PROVIDER_SITE_OTHER): Payer: Medicare Other | Admitting: Gastroenterology

## 2022-04-08 ENCOUNTER — Encounter: Payer: Self-pay | Admitting: Gastroenterology

## 2022-04-08 ENCOUNTER — Encounter: Payer: Self-pay | Admitting: Oncology

## 2022-04-08 VITALS — BP 113/73 | HR 69 | Temp 97.8°F | Ht 71.0 in | Wt 241.5 lb

## 2022-04-08 DIAGNOSIS — D509 Iron deficiency anemia, unspecified: Secondary | ICD-10-CM

## 2022-04-08 MED ORDER — NA SULFATE-K SULFATE-MG SULF 17.5-3.13-1.6 GM/177ML PO SOLN: 1.0000 | Freq: Once | ORAL | 0 refills | Status: AC

## 2022-04-08 NOTE — Addendum Note (Signed)
Addended by: Jacqualin Combes on: 04/08/2022 03:31 PM   Modules accepted: Orders

## 2022-04-08 NOTE — Telephone Encounter (Signed)
Lvm asking pt to call back. Need to know name of company, their phn #, fax # and what info they need for power mobility device.

## 2022-04-08 NOTE — Progress Notes (Signed)
[name], MD [address]  [place]  [place], [place] [address]  Main: [phone]  Fax: [phone]    Gastroenterology Consultation  Referring Provider:     [name], MD Primary Care Physician:  [name], MD Primary Gastroenterologist:  Dr. Lucilla Lame Reason for Consultation: Worsening of iron deficiency anemia        HPI:   BRALON ANTKOWIAK is a 67 y.o. male referred by Dr. [name], MD  for consultation & management of acute on chronic iron deficiency anemia.  Patient was originally evaluated by Dr. Allen Norris in 01/2019 for iron deficiency anemia.  Patient has history of A-fib on Coumadin.  He underwent upper endoscopy and colonoscopy on 05/22/2020 which did not reveal any source of iron deficiency anemia.  Patient also tells me that he underwent video capsule endoscopy but it was inconclusive.  I do not see any report on epic.  Patient received multiple iron infusions in the past.  Most recently his hemoglobin dropped to 6.7 on 02/22/2022.  Patient received 2 units of PRBCs in the ER and was discharged on oral iron.  His hemoglobin improved to 9.5 as of 03/25/2022.  Dropped to 8.4 on 04/02/22.  Dr. Danise Mina has reached out to me for further evaluation due to worsening anemia.  He is also seeing Dr. Janese Banks for parenteral iron therapy.  Patient has been advised to stop warfarin because of worsening anemia.  Patient denies any rectal bleeding, black stools, abdominal bloating, nausea or vomiting, abdominal pain.  Patient reports that he has lost about 40 pounds within last 1 year which is intentional.  He is no longer taking oral iron due to severe constipation. His baseline was around 10 until 01/12/2022.  Patient does not smoke tobacco or drink alcohol.  He does smoke marijuana  NSAIDs: None  Antiplts/Anticoagulants/Anti thrombotics: Warfarin for history of A-fib, stopped 3 days ago  GI Procedures:  EGD and colonoscopy 05/22/2020 - Normal  esophagus. - Normal stomach. - Normal examined duodenum. - No specimens collected.  - Preparation of the colon was fair. - A single (solitary) ulcer in the descending colon. Biopsied. - Granularity at the ileocecal valve. Biopsied. - Non-bleeding internal hemorrhoids. - Diverticulosis in the sigmoid colon.  DIAGNOSIS:  A. ILEOCECAL VALVE; COLD BIOPSY:  - FRAGMENTS OF UNREMARKABLE ENTERIC AND COLONIC MUCOSA.  - NEGATIVE FOR ACTIVE INFLAMMATION AND FEATURES OF CHRONICITY.  - NEGATIVE FOR DYSPLASIA AND MALIGNANCY.   B. COLON, DESCENDING; COLD BIOPSY:  - TUBULAR ADENOMA.  - NEGATIVE FOR HIGH-GRADE DYSPLASIA AND MALIGNANCY.   Past Medical History:  Diagnosis Date   Abnormal drug screen 2015   MJ positive x3 - one more and we will stop prescribing ativan (08/2013).   Anemia    Angina    Anxiety    Arrhythmia    atrial fibrillation   Arthritis    CAD (coronary artery disease)    nonobstructive   Cannabis abuse    Chronic systolic CHF (congestive heart failure) ([place]) 2013   NYHA Class II/III   COPD (chronic obstructive pulmonary disease) ([place])    Diabetes type 2, uncontrolled    declines DSME   Dilated cardiomyopathy ([place]) 2013   now improved   Ex-smoker    Frequent headaches    History of atrial fibrillation 2013   chronic, s/p ablation prior on coumadin   Hyperlipidemia    Hypertension    Migraine    Narcolepsy    Nonischemic cardiomyopathy ([place]) 2013  EF 25% per Dr Nehemiah Massed   Obesity    OSA (obstructive sleep apnea)    does not use CPAP - unable to tolerate   Seasonal allergies     Past Surgical History:  Procedure Laterality Date   ATRIAL FLUTTER ABLATION N/A 09/21/2011   Procedure: ATRIAL FLUTTER ABLATION;  Surgeon: Thompson Grayer, MD;  Location[facility] CATH LAB;  Service: Cardiovascular;  Laterality: N/A;   CARDIAC ELECTROPHYSIOLOGY MAPPING AND ABLATION  2013   for atrial flutter   CARDIOVASCULAR STRESS TEST  03/2012   ETT WNL Nehemiah Massed)   CARDIOVERSION N/A  08/12/2021   Procedure: CARDIOVERSION;  Surgeon: Corey Skains, MD;  Location: [facility] ORS;  Service: Cardiovascular;  Laterality: N/A;   COLONOSCOPY WITH PROPOFOL N/A 05/22/2020   TA, diverticulosis, descending colon ulcer biopsy WNL, int hem Allen Norris, Darren, MD)   ESOPHAGOGASTRODUODENOSCOPY (EGD) WITH PROPOFOL N/A 05/22/2020   WNL ([place])   US ECHOCARDIOGRAPHY  2014   EF 50%, nl LV fxn, RV nl size/function, mild mitral insuff     Current Outpatient Medications:    ACCU-CHEK FASTCLIX LANCETS MISC, Check blood sugar once daily and as instructed. Dx 250.00, Disp: 100 each, Rfl: 3   albuterol (VENTOLIN HFA) 108 (90 Base) MCG/ACT inhaler, Inhale 2 puffs into the lungs every 6 (six) hours as needed for wheezing or shortness of breath., Disp: 8.5 each, Rfl: 6   amiodarone (PACERONE) 200 MG tablet, Take 1 tablet (200 mg total) by mouth daily., Disp: 90 tablet, Rfl: 3   Blood Glucose Monitoring Suppl (BLOOD GLUCOSE MONITOR SYSTEM) W/DEVICE KIT, by Does not apply route. Use to check sugar once daily and as needed Dx: E11.9 **ONE TOUCH VERIO**, Disp: , Rfl:    Budeson-Glycopyrrol-Formoterol (BREZTRI AEROSPHERE) 160-9-4.8 MCG/ACT AERO, Inhale 2 puffs into the lungs 2 (two) times daily., Disp: 32.1 g, Rfl: 3   carvedilol (COREG) 6.25 MG tablet, TAKE 1 TABLET BY MOUTH TWICE A DAY WITH FOOD, Disp: 180 tablet, Rfl: 0   Cholecalciferol (VITAMIN D) 50 MCG (2000 UT) CAPS, Take 1 capsule (2,000 Units total) by mouth daily., Disp: 30 capsule, Rfl:    dapagliflozin propanediol (FARXIGA) 10 MG TABS tablet, Take 1 tablet (10 mg total) by mouth daily before breakfast., Disp: 90 tablet, Rfl: 3   Docusate Calcium (STOOL SOFTENER PO), Take 1 tablet by mouth daily., Disp: , Rfl:    FLUoxetine (PROZAC) 40 MG capsule, Take 1 capsule (40 mg total) by mouth daily., Disp: 30 capsule, Rfl: 6   fluticasone (FLONASE) 50 MCG/ACT nasal spray, Place 2 sprays into both nostrils daily., Disp: 48 mL, Rfl: 3   furosemide (LASIX) 80 MG  tablet, Take 80 mg by mouth 2 (two) times daily., Disp: , Rfl:    glimepiride (AMARYL) 4 MG tablet, Take 1 tablet (4 mg total) by mouth daily with breakfast., Disp: 90 tablet, Rfl: 1   glucose blood (ONETOUCH VERIO) test strip, Check blood sugar 3 times a day, Disp: 300 strip, Rfl: 3   ipratropium-albuterol (DUONEB) 0.5-2.5 (3) MG/3ML SOLN, TAKE 3 MLS BY NEBULIZATION EVERY 6 (SIX) HOURS AS NEEDED (SEVERE SHORTNESS OF BREATH/WHEEZING)., Disp: 360 mL, Rfl: 0   metFORMIN (GLUCOPHAGE) 1000 MG tablet, Take 1 tablet (1,000 mg total) by mouth 2 (two) times daily with a meal., Disp: 180 tablet, Rfl: 1   metolazone (ZAROXOLYN) 2.5 MG tablet, Take 1 tablet (2.5 mg total) by mouth once a week., Disp: , Rfl:    montelukast (SINGULAIR) 10 MG tablet, TAKE 1 TABLET BY MOUTH EVERYDAY AT BEDTIME,  Disp: 90 tablet, Rfl: 3   pantoprazole (PROTONIX) 40 MG tablet, TAKE 1 TABLET (40 MG TOTAL) BY MOUTH TWICE A DAY BEFORE MEALS, Disp: 180 tablet, Rfl: 0   potassium chloride (KLOR-CON M) 10 MEQ tablet, Take 1 tablet (10 mEq total) by mouth daily. And an extra one on the days you take metolazone, Disp: 38 tablet, Rfl: 5   Probiotic Product (PROBIOTIC DAILY PO), Take 1 tablet by mouth daily., Disp: , Rfl:    Saw Palmetto 450 MG CAPS, Take 1 capsule (450 mg total) by mouth daily., Disp: , Rfl: 0   simvastatin (ZOCOR) 20 MG tablet, TAKE 1 TABLET BY MOUTH EVERY DAY IN THE EVENING, Disp: 90 tablet, Rfl: 0   Turmeric (QC TUMERIC COMPLEX PO), Take by mouth., Disp: , Rfl:    vitamin B-12 (CYANOCOBALAMIN) 1000 MCG tablet, Take 1 tablet (1,000 mcg total) by mouth every Monday, Wednesday, and Friday., Disp: , Rfl:    warfarin (COUMADIN) 5 MG tablet, TAKE 1/2 TABLET BY MOUTH DAILY EXCEPT TAKE NO COUMADIN ON SUNDAY OR AS DIRECTED BY ANTICOAGULATION CLINIC, Disp: 60 tablet, Rfl: 1   Family History  Problem Relation Age of Onset   Heart failure Father 58   Cancer Mother 32       NHL   Hypertension Maternal Grandfather    CAD Maternal  Grandfather 33       MI   Diabetes Other        grandparents   Cancer Brother 67       lung (smoker)     Social History   Tobacco Use   Smoking status: Former    Packs/day: 1.00    Years: 31.00    Total pack years: 31.00    Types: Cigarettes    Quit date: 04/26/2018    Years since quitting: 3.9   Smokeless tobacco: Never  Vaping Use   Vaping Use: Never used  Substance Use Topics   Alcohol use: No    Alcohol/week: 0.0 standard drinks of alcohol   Drug use: Yes    Types: Marijuana    Allergies as of 04/08/2022 - Review Complete 04/08/2022  Allergen Reaction Noted   Atorvastatin Other (See Comments) 02/06/2013   Lisinopril Cough 02/06/2013    Review of Systems:    All systems reviewed and negative except where noted in HPI.   Physical Exam:  BP 113/73   Pulse 69   Temp 97.8 F (36.6 C) (Oral)   Ht _0  (1.803 m)   Wt 241 lb 8 oz (109.5 kg)   BMI 33.68 kg/m  No LMP for male patient.  General:   Alert,  Well-developed, well-nourished, pleasant and cooperative in NAD Head:  Normocephalic and atraumatic. Eyes:  Sclera clear, no icterus.   Conjunctiva pale. Ears:  Normal auditory acuity. Nose:  No deformity, discharge, or lesions. Mouth:  No deformity or lesions,oropharynx pink & moist. Neck:  Supple; no masses or thyromegaly. Lungs:  Respirations even and unlabored.  Clear throughout to auscultation.   No wheezes, crackles, or rhonchi. No acute distress. Heart:  Regular rate and rhythm; no murmurs, clicks, rubs, or gallops. Abdomen:  Normal bowel sounds. Soft, non-tender and non-distended without masses, hepatosplenomegaly or hernias noted.  No guarding or rebound tenderness.   Rectal: Not performed Msk:  Symmetrical without gross deformities. Good, equal movement & strength bilaterally. Pulses:  Normal pulses noted. Extremities:  No clubbing or edema.  No cyanosis. Neurologic:  Alert and oriented x3;  grossly normal neurologically. Skin:  Intact without  significant lesions or rashes. No jaundice. Psych:  Alert and cooperative. Normal mood and affect.  Imaging Studies: Reviewed  Assessment and Plan:   SHAYMUS EVELETH is a 66 y.o. pleasant Caucasian male with history of A-fib on Coumadin, history of metabolic syndrome, chronic iron deficiency anemia since 01/2019, responded to parenteral iron therapy.  Patient underwent bidirectional endoscopy in January 2022 with no source of anemia identified.  Discussed with patient that we will have to reassess his anemia given it's  progressing.  Also, his insurance will not approve for video capsule endoscopy because his EGD and colonoscopy were more than a year ago.  Patient is agreeable to undergo upper endoscopy and colonoscopy after Christmas.  Recommend gastric and duodenal biopsies, TI evaluation If EGD and colonoscopy are unremarkable, recommend video capsule endoscopy Recheck CBC today Trial of fusion +1 pill every other day until he is seen by hematology  I have discussed alternative options, risks & benefits,  which include, but are not limited to, bleeding, infection, perforation,respiratory complication & drug reaction.  The patient agrees with this plan & written consent will be obtained.    Follow up in 3 months or sooner if needed   [name], MD

## 2022-04-08 NOTE — Patient Outreach (Signed)
  Care Coordination   04/08/2022 Name: [name] MRN: [id number] DOB: 06/22/1955   Care Coordination Outreach Attempts:  An unsuccessful telephone outreach was attempted today to offer the patient information about available care coordination services as a benefit of their health plan.   HIPAA compliant voice message left with call back phone number.   Follow Up Plan:  Additional outreach attempts will be made to offer the patient care coordination information and services.   Encounter Outcome:  No Answer   Care Coordination Interventions:  No, not indicated    Quinn Plowman [facility] [place] [phone] direct line

## 2022-04-09 ENCOUNTER — Other Ambulatory Visit: Payer: Self-pay

## 2022-04-09 ENCOUNTER — Emergency Department
Admission: EM | Admit: 2022-04-09 | Discharge: 2022-04-09 | Disposition: A | Discharge status: Home / Self Care | Payer: Medicare Other | Attending: Emergency Medicine | Admitting: Emergency Medicine

## 2022-04-09 ENCOUNTER — Encounter: Payer: Self-pay | Admitting: Family

## 2022-04-09 ENCOUNTER — Telehealth: Payer: Self-pay

## 2022-04-09 ENCOUNTER — Ambulatory Visit (HOSPITAL_BASED_OUTPATIENT_CLINIC_OR_DEPARTMENT_OTHER): Payer: Medicare Other | Admitting: Family

## 2022-04-09 VITALS — BP 109/75 | HR 72 | Resp 18 | Wt 245.4 lb

## 2022-04-09 DIAGNOSIS — E119 Type 2 diabetes mellitus without complications: Secondary | ICD-10-CM

## 2022-04-09 DIAGNOSIS — K5900 Constipation, unspecified: Secondary | ICD-10-CM | POA: Insufficient documentation

## 2022-04-09 DIAGNOSIS — G47419 Narcolepsy without cataplexy: Secondary | ICD-10-CM | POA: Insufficient documentation

## 2022-04-09 DIAGNOSIS — I2510 Atherosclerotic heart disease of native coronary artery without angina pectoris: Secondary | ICD-10-CM | POA: Insufficient documentation

## 2022-04-09 DIAGNOSIS — Z7901 Long term (current) use of anticoagulants: Secondary | ICD-10-CM | POA: Insufficient documentation

## 2022-04-09 DIAGNOSIS — E785 Hyperlipidemia, unspecified: Secondary | ICD-10-CM | POA: Insufficient documentation

## 2022-04-09 DIAGNOSIS — I10 Essential (primary) hypertension: Secondary | ICD-10-CM

## 2022-04-09 DIAGNOSIS — G4733 Obstructive sleep apnea (adult) (pediatric): Secondary | ICD-10-CM | POA: Insufficient documentation

## 2022-04-09 DIAGNOSIS — D649 Anemia, unspecified: Secondary | ICD-10-CM | POA: Insufficient documentation

## 2022-04-09 DIAGNOSIS — F419 Anxiety disorder, unspecified: Secondary | ICD-10-CM | POA: Insufficient documentation

## 2022-04-09 DIAGNOSIS — Z87891 Personal history of nicotine dependence: Secondary | ICD-10-CM | POA: Insufficient documentation

## 2022-04-09 DIAGNOSIS — I110 Hypertensive heart disease with heart failure: Secondary | ICD-10-CM | POA: Insufficient documentation

## 2022-04-09 DIAGNOSIS — K922 Gastrointestinal hemorrhage, unspecified: Secondary | ICD-10-CM | POA: Diagnosis not present

## 2022-04-09 DIAGNOSIS — I5042 Chronic combined systolic (congestive) and diastolic (congestive) heart failure: Secondary | ICD-10-CM | POA: Insufficient documentation

## 2022-04-09 DIAGNOSIS — J449 Chronic obstructive pulmonary disease, unspecified: Secondary | ICD-10-CM | POA: Insufficient documentation

## 2022-04-09 DIAGNOSIS — D508 Other iron deficiency anemias: Secondary | ICD-10-CM

## 2022-04-09 DIAGNOSIS — R739 Hyperglycemia, unspecified: Secondary | ICD-10-CM | POA: Insufficient documentation

## 2022-04-09 DIAGNOSIS — I509 Heart failure, unspecified: Secondary | ICD-10-CM | POA: Insufficient documentation

## 2022-04-09 DIAGNOSIS — I5032 Chronic diastolic (congestive) heart failure: Secondary | ICD-10-CM

## 2022-04-09 DIAGNOSIS — I4819 Other persistent atrial fibrillation: Secondary | ICD-10-CM | POA: Diagnosis not present

## 2022-04-09 LAB — CBC
HCT: 28.1 % — ABNORMAL LOW (ref 39.0–52.0)
Hematocrit: 25.5 % — ABNORMAL LOW (ref 37.5–51.0)
Hemoglobin: 7.3 g/dL — ABNORMAL LOW (ref 13.0–17.7)
Hemoglobin: 7.5 g/dL — ABNORMAL LOW (ref 13.0–17.0)
MCH: 20.1 pg — ABNORMAL LOW (ref 26.6–33.0)
MCH: 19.7 pg — ABNORMAL LOW (ref 26.0–34.0)
MCHC: 28.6 g/dL — ABNORMAL LOW (ref 31.5–35.7)
MCHC: 26.7 g/dL — ABNORMAL LOW (ref 30.0–36.0)
MCV: 70 fL — ABNORMAL LOW (ref 79–97)
MCV: 73.9 fL — ABNORMAL LOW (ref 80.0–100.0)
Platelets: 325 10*3/uL (ref 150–450)
Platelets: 351 10*3/uL (ref 150–400)
RBC: 3.64 x10E6/uL — ABNORMAL LOW (ref 4.14–5.80)
RBC: 3.8 MIL/uL — ABNORMAL LOW (ref 4.22–5.81)
RDW: 20.1 % — ABNORMAL HIGH (ref 11.6–15.4)
RDW: 21.2 % — ABNORMAL HIGH (ref 11.5–15.5)
WBC: 10.9 10*3/uL — ABNORMAL HIGH (ref 3.4–10.8)
WBC: 11.5 10*3/uL — ABNORMAL HIGH (ref 4.0–10.5)
nRBC: 0 % (ref 0.0–0.2)

## 2022-04-09 LAB — APTT: aPTT: 28 seconds (ref 24–36)

## 2022-04-09 LAB — BASIC METABOLIC PANEL
Anion gap: 14 (ref 5–15)
BUN: 24 mg/dL — ABNORMAL HIGH (ref 8–23)
CO2: 35 mmol/L — ABNORMAL HIGH (ref 22–32)
Calcium: 9 mg/dL (ref 8.9–10.3)
Chloride: 91 mmol/L — ABNORMAL LOW (ref 98–111)
Creatinine, Ser: 1.2 mg/dL (ref 0.61–1.24)
GFR, Estimated: >60 mL/min (ref >60)
Glucose, Bld: 226 mg/dL — ABNORMAL HIGH (ref 70–99)
Potassium: 3.8 mmol/L (ref 3.5–5.1)
Sodium: 140 mmol/L (ref 135–145)

## 2022-04-09 LAB — PROTIME-INR
INR: 1 (ref 0.8–1.2)
Prothrombin Time: 13.5 seconds (ref 11.4–15.2)

## 2022-04-09 LAB — PREPARE RBC (CROSSMATCH)

## 2022-04-09 MED ORDER — SODIUM CHLORIDE 0.9% IV SOLUTION
Freq: Once | INTRAVENOUS | Status: AC
  Filled 2022-04-09: qty 250

## 2022-04-09 MED ORDER — PANTOPRAZOLE SODIUM 40 MG IV SOLR
40.0000 mg | Freq: Once | INTRAVENOUS | Status: AC
  Administered 2022-04-09: 40 mg via INTRAVENOUS
  Filled 2022-04-09: qty 10

## 2022-04-09 NOTE — Telephone Encounter (Signed)
...     Pre-operative Risk Assessment    Patient Name: [name]  DOB: 08/17/1955 MRN: [id number]      Request for Surgical Clearance    Procedure:   COLONOSCOPY  Date of Surgery:  Clearance TBD                                 Surgeon:    Surgeon's Group or Practice Name:  [facility] GASTROENTEROLOGY Phone number:  [phone] Fax number:  [phone]   Type of Clearance Requested:   - Medical  - Pharmacy:  Hold Warfarin (Coumadin)     Type of Anesthesia:  General    Additional requests/questions:    Gwenlyn Found   04/09/2022, 9:31 AM

## 2022-04-09 NOTE — ED Notes (Signed)
Dr Jari Pigg at bedside, risks and benefits of admission discussed and pt leaving, pt also educated if he changes his mind he can still be admitted. Pt A&Ox4 at this time.

## 2022-04-09 NOTE — Progress Notes (Signed)
Patient ID: [name], male    DOB: 12-22-1955, 66 y.o.   MRN: [id number]  HPI  [name] is a 66 y/o male with a history of atrial fibrillation, narcolepsy, CAD, DM, hyperlipidemia, HTN, anemia, anxiety, COPD, OSA, previous tobacco use and chronic heart failure.   Echo report from 11/19/21 reviewed and showed an EF of 60-65% along with mild LVH/ LAE.  Was in the ED 02/23/22 due to SOB and lightheadedness and found to be anemic. Given 2 units of blood and released with improvement of symptoms and increase in hemoglobin.  Admitted 01/03/22 due to leg pain, shortness of breath and pedal edema. Placed on non-rebreather mask due to hypoxia. Initially given IV lasix with transition to oral diuretics. Given antibiotics for sepsis. Needed solu-medrol and then changed to oral prednisone. Weaned to oxygen via nasal cannula. Discharged after 2 days. Was in the ED 12/28/21 due to chest wall pain where he was evaluated and released.    He presents today for a follow-up visit with a chief complaint of minimal fatigue with moderate exertion. Describes this as chronic in nature. He has associated cough, shortness of breath, pedal edema, dizziness, easy bruising, anxiety and chronic difficulty sleeping along with this. He denies any abdominal distention/ pain, palpitations, chest pain or weight gain.   Intentionally trying to lose weight by eating less and eating healthier. Drinking 2 pots of coffee, 16 ounces diet tea and 20 ounce snapple daily.   Says that he was doing well before he got a phone call early this morning from GI office saying that he needed to go to the ED for a blood transfusion which set him into a panic attack. He says that he has pets at home that he has to make arrangements for and just can't "up and leave".   Takes metolazone once weekly with an extra potassium tablet.   Past Medical History:  Diagnosis Date   Abnormal drug screen 2015   MJ positive x3 - one more and we will stop  prescribing ativan (08/2013).   Anemia    Angina    Anxiety    Arrhythmia    atrial fibrillation   Arthritis    CAD (coronary artery disease)    nonobstructive   Cannabis abuse    Chronic systolic CHF (congestive heart failure) ([place]) 2013   NYHA Class II/III   COPD (chronic obstructive pulmonary disease) ([place])    Diabetes type 2, uncontrolled    declines DSME   Dilated cardiomyopathy ([place]) 2013   now improved   Ex-smoker    Frequent headaches    History of atrial fibrillation 2013   chronic, s/p ablation prior on coumadin   Hyperlipidemia    Hypertension    Migraine    Narcolepsy    Nonischemic cardiomyopathy ([place]) 2013   EF 25% per Dr Nehemiah Massed   Obesity    OSA (obstructive sleep apnea)    does not use CPAP - unable to tolerate   Seasonal allergies    Past Surgical History:  Procedure Laterality Date   ATRIAL FLUTTER ABLATION N/A 09/21/2011   Procedure: ATRIAL FLUTTER ABLATION;  Surgeon: Thompson Grayer, MD;  Location: [facility] CATH LAB;  Service: Cardiovascular;  Laterality: N/A;   CARDIAC ELECTROPHYSIOLOGY MAPPING AND ABLATION  2013   for atrial flutter   CARDIOVASCULAR STRESS TEST  03/2012   ETT WNL Nehemiah Massed)   CARDIOVERSION N/A 08/12/2021   Procedure: CARDIOVERSION;  Surgeon: Corey Skains, MD;  Location: [facility] ORS;  Service: Cardiovascular;  Laterality: N/A;   COLONOSCOPY WITH PROPOFOL N/A 05/22/2020   TA, diverticulosis, descending colon ulcer biopsy WNL, int hem Allen Norris, Darren, MD)   ESOPHAGOGASTRODUODENOSCOPY (EGD) WITH PROPOFOL N/A 05/22/2020   WNL ([place])   US ECHOCARDIOGRAPHY  2014   EF 50%, nl LV fxn, RV nl size/function, mild mitral insuff   Family History  Problem Relation Age of Onset   Heart failure Father 21   Cancer Mother 15       NHL   Hypertension Maternal Grandfather    CAD Maternal Grandfather 28       MI   Diabetes Other        grandparents   Cancer Brother 41       lung (smoker)   Social History   Tobacco Use   Smoking status: Former     Packs/[name]: 1.00    Years: 31.00    Total pack years: 31.00    Types: Cigarettes    Quit date: 04/26/2018    Years since quitting: 3.9   Smokeless tobacco: Never  Substance Use Topics   Alcohol use: No    Alcohol/week: 0.0 standard drinks of alcohol   Allergies  Allergen Reactions   Atorvastatin Other (See Comments)    Dizziness   Lisinopril Cough   Prior to Admission medications   Medication Sig Start Date End Date Taking? Authorizing Provider  ACCU-CHEK FASTCLIX LANCETS MISC Check blood sugar once daily and as instructed. Dx 250.00 05/10/13  Yes [name], MD  albuterol (VENTOLIN HFA) 108 (90 Base) MCG/ACT inhaler Inhale 2 puffs into the lungs every 6 (six) hours as needed for wheezing or shortness of breath. 01/12/22  Yes [name], MD  amiodarone (PACERONE) 200 MG tablet Take 1 tablet (200 mg total) by mouth daily. 09/16/21  Yes Vickie Epley, MD  Blood Glucose Monitoring Suppl (BLOOD GLUCOSE MONITOR SYSTEM) W/DEVICE KIT by Does not apply route. Use to check sugar once daily and as needed Dx: E11.9 **ONE TOUCH VERIO**   Yes [provider]  Budeson-Glycopyrrol-Formoterol (BREZTRI AEROSPHERE) 160-9-4.8 MCG/ACT AERO Inhale 2 puffs into the lungs 2 (two) times daily. 10/23/21  Yes [name], MD  carvedilol (COREG) 6.25 MG tablet TAKE 1 TABLET BY MOUTH TWICE A [name] WITH FOOD 01/26/22  Yes [name], MD  Cholecalciferol (VITAMIN D) 50 MCG (2000 UT) CAPS Take 1 capsule (2,000 Units total) by mouth daily. 02/05/19  Yes [name], MD  dapagliflozin propanediol (FARXIGA) 10 MG TABS tablet Take 1 tablet (10 mg total) by mouth daily before breakfast. 03/01/22  Yes Alisa Graff, FNP  Docusate Calcium (STOOL SOFTENER PO) Take 1 tablet by mouth daily.   Yes [provider]  FLUoxetine (PROZAC) 40 MG capsule Take 1 capsule (40 mg total) by mouth daily. 02/19/22  Yes [name], MD  fluticasone [facility]) 50 MCG/ACT nasal spray Place 2  sprays into both nostrils daily. 01/12/22  Yes [name], MD  furosemide (LASIX) 80 MG tablet Take 80 mg by mouth 2 (two) times daily. 12/25/21  Yes [provider]  glimepiride (AMARYL) 4 MG tablet Take 1 tablet (4 mg total) by mouth daily with breakfast. 01/14/22  Yes [name], MD  glucose blood [facility] VERIO) test strip Check blood sugar 3 times a [name] 10/22/21  Yes [name], MD  ipratropium-albuterol (DUONEB) 0.5-2.5 (3) MG/3ML SOLN TAKE 3 MLS BY NEBULIZATION EVERY 6 (SIX) HOURS AS NEEDED (SEVERE SHORTNESS OF BREATH/WHEEZING). 03/25/22  Yes [name], MD  metFORMIN (GLUCOPHAGE)  1000 MG tablet Take 1 tablet (1,000 mg total) by mouth 2 (two) times daily with a meal. 01/14/22  Yes [name], MD  metolazone (ZAROXOLYN) 2.5 MG tablet Take 1 tablet (2.5 mg total) by mouth once a week. 04/03/22  Yes [name], MD  montelukast (SINGULAIR) 10 MG tablet TAKE 1 TABLET BY MOUTH EVERYDAY AT BEDTIME 07/07/21  Yes [name], MD  pantoprazole (PROTONIX) 40 MG tablet TAKE 1 TABLET (40 MG TOTAL) BY MOUTH TWICE A [name] BEFORE MEALS 01/11/22  Yes [name], MD  potassium chloride (KLOR-CON M) 10 MEQ tablet Take 1 tablet (10 mEq total) by mouth daily. And an extra one on the days you take metolazone 02/25/22  Yes Darylene Price A, FNP  Probiotic Product (PROBIOTIC DAILY PO) Take 1 tablet by mouth daily.   Yes [provider]  Saw Palmetto 450 MG CAPS Take 1 capsule (450 mg total) by mouth daily. 03/29/22  Yes [name], MD  simvastatin (ZOCOR) 20 MG tablet TAKE 1 TABLET BY MOUTH EVERY [name] IN THE EVENING 12/29/21  Yes [name], MD  Turmeric (QC TUMERIC COMPLEX PO) Take by mouth.   Yes [provider]  vitamin B-12 (CYANOCOBALAMIN) 1000 MCG tablet Take 1 tablet (1,000 mcg total) by mouth every Monday, Wednesday, and Friday. 07/22/21  Yes [name], MD  warfarin (COUMADIN) 5 MG tablet TAKE 1/2 TABLET BY MOUTH DAILY  EXCEPT TAKE NO COUMADIN ON SUNDAY OR AS DIRECTED BY ANTICOAGULATION CLINIC 03/26/22  Yes [name], MD   Review of Systems  Constitutional:  Positive for fatigue. Negative for appetite change.  HENT:  Positive for congestion. Negative for postnasal drip and sore throat.   Eyes: Negative.   Respiratory:  Positive for cough (early morning) and shortness of breath (with moderate exertion). Negative for chest tightness.   Cardiovascular:  Positive for leg swelling (stable). Negative for chest pain and palpitations.  Gastrointestinal:  Negative for abdominal distention, abdominal pain, anal bleeding and blood in stool.  Endocrine: Negative.   Genitourinary:  Negative for hematuria.  Musculoskeletal:  Positive for neck pain. Negative for back pain.  Skin: Negative.   Allergic/Immunologic: Negative.   Neurological:  Positive for dizziness (with sudden position changes). Negative for weakness and light-headedness.  Hematological:  Negative for adenopathy. Bruises/bleeds easily.  Psychiatric/Behavioral:  Positive for sleep disturbance (sleeping on 2-3 pillows; oxygen at 2L at home and bedtime). Negative for dysphoric mood. The patient is nervous/anxious.    Vitals:   04/09/22 0847  BP: 109/75  Pulse: 72  Resp: 18  SpO2: 95%  Weight: 245 lb 6 oz (111.3 kg)   Wt Readings from Last 3 Encounters:  04/09/22 245 lb 6 oz (111.3 kg)  04/08/22 241 lb 8 oz (109.5 kg)  04/02/22 238 lb 9.6 oz (108.2 kg)   Lab Results  Component Value Date   CREATININE 1.2 03/25/2022   CREATININE 0.72 02/22/2022   CREATININE 0.89 02/19/2022   Physical Exam Vitals and nursing note reviewed.  Constitutional:      Appearance: He is well-developed.  HENT:     Head: Normocephalic and atraumatic.  Cardiovascular:     Rate and Rhythm: Normal rate and regular rhythm.  Pulmonary:     Effort: Pulmonary effort is normal.     Breath sounds: No wheezing, rhonchi or rales.  Abdominal:     Palpations: Abdomen is  soft.     Tenderness: There is no abdominal tenderness.     Comments: Umbilical hernia present  Musculoskeletal:  Cervical back: Normal range of motion and neck supple.     Right lower leg: No tenderness. Edema (trace pitting) present.     Left lower leg: No tenderness. Edema (trace pitting) present.  Skin:    General: Skin is warm and dry.  Neurological:     General: No focal deficit present.     Mental Status: He is alert and oriented to person, place, and time.  Psychiatric:        Mood and Affect: Mood is anxious.        Behavior: Behavior normal.    Assessment & Plan:  1: Chronic heart failure with preserved ejection fraction with structural changes (LVH/LAE)- - NYHA class II - euvolemic today - weighing daily; reminded to call for an overnight weight gain of > 2 pounds or a weekly weight gain of > 5 pounds - weight down 9 pounds from last visit here 6 weeks ago - not adding salt and is reading food labels for sodium content & is trying to lose weight by eating less and eating healthier - reviewed the importance of keeping daily fluid intake to 60 ounces / [name]; he's currently drinking 2 pots of coffee, 16 ounces diet tea and 20 ounces of snapple daily - saw cardiology Nehemiah Massed) 02/15/22 - on GDMT of farxiga  - briefly discussed adding entresto but will defer as he says that he developed a panic attack after getting phone call from GI provider (see below) - BNP 01/03/22 was 289.4  2: HTN- - BP looks good (109/75) - saw PCP Danise Mina) 04/02/22 - BMP 03/25/22 reviewed and showed sodium 139, potassium 3.9, creatinine 1.2 and GFR >60  3: DM- - A1c 01/03/22 was 8.6% - home glucose today was 256  4: Atrial fibrillation- - saw EP Quentin Ore) 09/16/21 - taking amiodarone, carvedilol and warfarin  5: COPD- - wearing oxygen at 2L at home and at bedtime - has CPAP but says that it's old and doesn't work right - saw pulmonology (Dgayli) 01/18/22  6: Anemia- - Hg 04/08/22 was  7.3 - reports oral iron makes him constipated - taking B12 3 times/ week - saw GI (Vanga) 04/08/22 - endoscopy scheduled for 04/28/22 - he says that he was woke up by phone call from GI provider telling him abut his hemoglobin being low and that he needed to go to the ED for a blood transfusion which then caused a panic attack. He says that he can't just "up and go" because he has dogs to make arrangements for and can't leave them unattended. Explained the possible issues with a low hemoglobin such as increased shortness of breath, increased stress on the heart etc. Patient verbalized understanding but said that he needed to go home and make arrangements for his dog and then he would go to the ED. - secure chat sent to Dr. Marius Ditch to make her aware   Medication list reviewed.   Return in 6 weeks, sooner if needed

## 2022-04-09 NOTE — ED Notes (Signed)
Attempted IV with no success. Pt taken to ED 24 at this time. RN informed of attempt and at beside with pt.

## 2022-04-09 NOTE — ED Notes (Signed)
D/c instructions reviewed and questions answered. Return precautions discussed.

## 2022-04-09 NOTE — ED Provider Notes (Signed)
-----------------------------------------   7:53 PM on 04/09/2022 ----------------------------------------- I have talked to this patient a couple times about staying but he still does not want to stay.  I will let him go now.  He will follow-up with Dr. Marius Ditch as planned and return if he has any further problems.   Nena Polio, MD 04/09/22 [phone]

## 2022-04-09 NOTE — Telephone Encounter (Signed)
Called and informed patient of this information and he verbalized understanding of instructions. He states he can not go now because he has a appointment this morning with his cardiologist. Informed patient that he needs to call them and tell them that you have to go to the ER. Informed him they would understand and reschedule his appointment. He stated he would go

## 2022-04-09 NOTE — ED Triage Notes (Addendum)
Pt comes with c/o low hgb per doctor. Pt states they were drawn yesterday. Pt states it was 7.3.  Pt states dizziness and weakness. Pt appears pale.

## 2022-04-09 NOTE — Telephone Encounter (Signed)
Pt called and wants to know if there is another place to get blood transfusion than hospital. Dr Darnell Level saw GI note and pt needs to go to ED for blood transfusion and possible admission. Pt voiced understanding. Pt said he will need to get someone to take care of his dog and get transportation to [facility] ED. I advised pt needs to go to ED ASAP and if cannot get transportation pt should call 911. Pt voiced  understanding and will go to [facility] very soon. Sending note as FYI to Dr Darnell Level.

## 2022-04-09 NOTE — ED Provider Notes (Signed)
[facility] Provider Note    Event Date/Time   First MD Initiated Contact with Patient 04/09/22 1239     (approximate)   History   low hemoglobin   HPI  [name] is a 66 y.o. male who comes in with concerns for low hemoglobin of 7.3 yesterday.  Patient reports some dizziness and weakness.  On review notes patient was seen by Dr. Marius Ditch yesterday.  Patient has a history of acute on chronic iron deficient anemia.  He does have a history of A-fib which she is on Coumadin for.  Patient's most recent hemoglobin drop was to 6.7 on 10/30 and he got 2 units in the ER was discharged on oral iron and it had improved to 9.5 as of 03/25/2022.  Patient reports that they are planning to do an endoscopy colonoscopy in January.  He denies any symptoms denies any falls.  He reports that he was just sent over here due to the incidentally noted low hemoglobin.  He does report recently being started on some new medications to help with his iron.  It looks like they are trialing fusion every other day until he can follow-up with hematology.  He reports a history of CHF but reports that his fluid balance is much better than baseline reports only trace swelling which is much better than normal.  He does report recently being taken off and warfarin 3 days ago and has been on aspirin instead for his A-fib.     Physical Exam   Triage Vital Signs: ED Triage Vitals  Enc Vitals Group     BP 04/09/22 1227 (!) 94/58     Pulse Rate 04/09/22 1227 72     Resp 04/09/22 1227 18     Temp 04/09/22 1227 98 F (36.7 C)     Temp src --      SpO2 04/09/22 1227 100 %     Weight --      Height --      Head Circumference --      Peak Flow --      Pain Score 04/09/22 1226 0     Pain Loc --      Pain Edu? --      Excl. in [place]? --     Most recent vital signs: Vitals:   04/09/22 1227  BP: (!) 94/58  Pulse: 72  Resp: 18  Temp: 98 F (36.7 C)  SpO2: 100%     General: Awake, no  distress.  CV:  Good peripheral perfusion.  Resp:  Normal effort.  Abd:  No distention.  Other:  Trace edema noted bilaterally Brown stool but Hemoccult positive  ED Results / Procedures / Treatments   Labs (all labs ordered are listed, but only abnormal results are displayed) Labs Reviewed  BASIC METABOLIC PANEL  CBC  URINALYSIS, ROUTINE W REFLEX MICROSCOPIC  CBG MONITORING, ED  TYPE AND SCREEN     EKG  My interpretation of EKG:  Possible A-fib rate of 74 without any ST elevation or T wave inversions, normal levels    PROCEDURES:  Critical Care performed: Yes, see critical care procedure note(s)  .Critical Care  Performed by: Vanessa Shelton, MD Authorized by: Vanessa Whitney, MD   Critical care provider statement:    Critical care time (minutes):  30   Critical care was necessary to treat or prevent imminent or life-threatening deterioration of the following conditions:  Cardiac failure   Critical care was  time spent personally by me on the following activities:  Development of treatment plan with patient or surrogate, discussions with consultants, evaluation of patient's response to treatment, examination of patient, ordering and review of laboratory studies, ordering and review of radiographic studies, ordering and performing treatments and interventions, pulse oximetry, re-evaluation of patient's condition and review of old charts .1-3 Lead EKG Interpretation  Performed by: Vanessa Fox Chase, MD Authorized by: Vanessa Tuluksak, MD     Interpretation: abnormal     ECG rate:  70   ECG rate assessment: normal     Rhythm: atrial fibrillation     Ectopy: none     Conduction: normal      MEDICATIONS ORDERED IN ED: Medications  pantoprazole (PROTONIX) injection 40 mg (has no administration in time range)  0.9 %  sodium chloride infusion (Manually program via Guardrails IV Fluids) ( Intravenous New Bag/Given 04/09/22 1454)     IMPRESSION / MDM / ASSESSMENT AND PLAN / ED  COURSE  I reviewed the triage vital signs and the nursing notes.   Patient's presentation is most consistent with acute presentation with potential threat to life or bodily function.   Patient comes in with concerns for low hemoglobin although fairly asymptomatic.  He reports that he would just like to have his blood transfusions and discharge.  Explained to patient that his rectal exam was positive for Hemoccult blood and given his intermittent low hemoglobins even after having the 2 transfusions in October that I would recommend admission to expedite colonoscopy, endoscopy.  Patient reports he is not able to stay due to having no one to watch his dog.  I explained to the patient the risk including death and permanent disability he states that he is going to try and call and see if he can find anybody to watch his dog but he would most likely prefer to have the blood transfusion and then to be discharged and have it done outpatient.  PTT normal INR are normal.  BMP shows elevated bicarb similar to prior.  Glucose elevated but normal anion gap.  CBC shows a low hemoglobin of 7.5 down from 9.52 weeks ago.  Platelets are normal.  I had a lengthy conversation with patient I would recommend admission for closer trending of his hemoglobin's given his Hemoccult was positive although it was brown stool not melena.  I explained the patient that I would be worried that if he continued bleeding that it could lead to death or permanent disability.  I explained to patient that we will help facilitate him getting a closer endoscopy and colonoscopy probably tomorrow.  He reports last time he got the 2 units of blood he actually did pretty well.  This was on 10/31 and on 2 weeks later it was 9.5 and so it is gradually declined over the past 2 weeks.  He remains asymptomatic.  I explained to patient that was against my medical advice for him to leave but he states that he be willing to sign any papers that I am not liable  but that he really does not want to stay in the hospital.  He reports having a dog that he needs to get back to.  He reports not having anybody to help take care of it.  He states that he will come back if symptoms are worsening.  He understands this risk and has the capacity to make this decision he does not have any family member that can come help him with  his dog.  He reports that he lives here by himself.  We have had significant discussions about staying but he again would like to leave.  Will give a dose of PPI and give him 2 units of blood. Pt denies any alcohol use.   I did discuss with Dr. Marius Ditch and told her that he was not willing to stay in the hospital and she will try to get him an outpatient colonoscopy endoscopy as soon as possible.  Patient handed off to oncoming team pending blood but then patient will most likely be discharged given he at this time refuses admission.  Patient does report already being on a PPI.  The patient is on the cardiac monitor to evaluate for evidence of arrhythmia and/or significant heart rate changes.      FINAL CLINICAL IMPRESSION(S) / ED DIAGNOSES   Final diagnoses:  Gastrointestinal hemorrhage, unspecified gastrointestinal hemorrhage type  Anemia, unspecified type     Rx / DC Orders   ED Discharge Orders     None        Note:  This document was prepared using Dragon voice recognition software and may include unintentional dictation errors.   Vanessa Dillingham, MD 04/09/22 [phone]

## 2022-04-09 NOTE — Telephone Encounter (Signed)
-----   Message from Lin Landsman, MD sent at 04/09/2022  7:25 AM EST ----- Please inform pt that his Hb is very low and further dropping compared to previous one. Recommend pt to go to ER for blood transfusion and possible admission for work up   [employer]

## 2022-04-09 NOTE — ED Notes (Signed)
Blood consent filed with paper chart, risks and benefits discussed.

## 2022-04-09 NOTE — Telephone Encounter (Signed)
Spoke with pt relaying relaying message. Pt verbalizes understanding and will call back with some info.

## 2022-04-09 NOTE — Discharge Instructions (Addendum)
We recommended that you be admitted to the hospital to monitor for GI bleeding.  We are unsure how fast the lost this blood over the past 2 weeks and it could be possibly lost in the past few days and this can lead to death or permanent disability if u continue to lose it. You have opted to not want to be admitted and prefer to leave against our advice.  However we did discuss with Dr. Marius Ditch who is going to try to get you an outpatient follow-up.  He should return to the ER if develop black stool, worsening symptoms shortness of breath fatigue lightheadedness or any other concerns.  Otherwise you can at least follow-up with your primary care doctor next week for recheck of hemoglobin.

## 2022-04-09 NOTE — Telephone Encounter (Signed)
Patient's coumadin is managed by primary care (Dr. Danise Mina). His general cardiologist is Dr. Nehemiah Massed with [facility].  Will forward this request back to requesting provider.   Emmaline Life, NP-C  04/09/2022, 11:42 AM [address] Office [phone] Fax [phone]

## 2022-04-09 NOTE — Telephone Encounter (Signed)
Contacted [name], [place] nurse from [facility] for pt. [name] has been obtaining INR for coumadin clinic during her weekly visits.   Contacted [name] and advised pt was advised to go to ER today for blood transfusion and possible admittance. Advised this office will f/u with further information when pt is out of the hospital and if INR checks are needed. [name] verbalized understanding.

## 2022-04-09 NOTE — Telephone Encounter (Signed)
Noted. Agree with recommendations. Thank you.

## 2022-04-09 NOTE — Patient Instructions (Signed)
Continue weighing daily and call for an overnight weight gain of 3 pounds or more or a weekly weight gain of more than 5 pounds.   If you have voicemail, please make sure your mailbox is cleaned out so that we may leave a message and please make sure to listen to any voicemails.     

## 2022-04-10 ENCOUNTER — Other Ambulatory Visit: Payer: Self-pay | Admitting: Family Medicine

## 2022-04-10 LAB — BPAM RBC
Blood Product Expiration Date: 202401142359
Blood Product Expiration Date: 202401142359
ISSUE DATE / TIME: 202312151445
ISSUE DATE / TIME: 202312151713
Unit Type and Rh: 6200
Unit Type and Rh: 6200

## 2022-04-10 LAB — TYPE AND SCREEN
ABO/RH(D): A POS
Antibody Screen: NEGATIVE
Unit division: 0
Unit division: 0

## 2022-04-12 ENCOUNTER — Ambulatory Visit: Payer: Self-pay

## 2022-04-12 ENCOUNTER — Telehealth: Payer: Self-pay

## 2022-04-12 DIAGNOSIS — Z9181 History of falling: Secondary | ICD-10-CM | POA: Diagnosis not present

## 2022-04-12 DIAGNOSIS — E1165 Type 2 diabetes mellitus with hyperglycemia: Secondary | ICD-10-CM | POA: Diagnosis not present

## 2022-04-12 DIAGNOSIS — Z87891 Personal history of nicotine dependence: Secondary | ICD-10-CM | POA: Diagnosis not present

## 2022-04-12 DIAGNOSIS — I110 Hypertensive heart disease with heart failure: Secondary | ICD-10-CM | POA: Diagnosis not present

## 2022-04-12 DIAGNOSIS — G4733 Obstructive sleep apnea (adult) (pediatric): Secondary | ICD-10-CM | POA: Diagnosis not present

## 2022-04-12 DIAGNOSIS — I25119 Atherosclerotic heart disease of native coronary artery with unspecified angina pectoris: Secondary | ICD-10-CM | POA: Diagnosis not present

## 2022-04-12 DIAGNOSIS — I5042 Chronic combined systolic (congestive) and diastolic (congestive) heart failure: Secondary | ICD-10-CM | POA: Diagnosis not present

## 2022-04-12 DIAGNOSIS — M1990 Unspecified osteoarthritis, unspecified site: Secondary | ICD-10-CM | POA: Diagnosis not present

## 2022-04-12 DIAGNOSIS — D509 Iron deficiency anemia, unspecified: Secondary | ICD-10-CM

## 2022-04-12 DIAGNOSIS — I872 Venous insufficiency (chronic) (peripheral): Secondary | ICD-10-CM | POA: Diagnosis not present

## 2022-04-12 DIAGNOSIS — F32A Depression, unspecified: Secondary | ICD-10-CM | POA: Diagnosis not present

## 2022-04-12 DIAGNOSIS — Z9981 Dependence on supplemental oxygen: Secondary | ICD-10-CM | POA: Diagnosis not present

## 2022-04-12 DIAGNOSIS — I4891 Unspecified atrial fibrillation: Secondary | ICD-10-CM | POA: Diagnosis not present

## 2022-04-12 DIAGNOSIS — J441 Chronic obstructive pulmonary disease with (acute) exacerbation: Secondary | ICD-10-CM | POA: Diagnosis not present

## 2022-04-12 DIAGNOSIS — Z7984 Long term (current) use of oral hypoglycemic drugs: Secondary | ICD-10-CM | POA: Diagnosis not present

## 2022-04-12 DIAGNOSIS — Z7901 Long term (current) use of anticoagulants: Secondary | ICD-10-CM | POA: Diagnosis not present

## 2022-04-12 DIAGNOSIS — E876 Hypokalemia: Secondary | ICD-10-CM | POA: Diagnosis not present

## 2022-04-12 DIAGNOSIS — G43909 Migraine, unspecified, not intractable, without status migrainosus: Secondary | ICD-10-CM | POA: Diagnosis not present

## 2022-04-12 DIAGNOSIS — E785 Hyperlipidemia, unspecified: Secondary | ICD-10-CM | POA: Diagnosis not present

## 2022-04-12 DIAGNOSIS — I420 Dilated cardiomyopathy: Secondary | ICD-10-CM | POA: Diagnosis not present

## 2022-04-12 DIAGNOSIS — Z5181 Encounter for therapeutic drug level monitoring: Secondary | ICD-10-CM | POA: Diagnosis not present

## 2022-04-12 DIAGNOSIS — I252 Old myocardial infarction: Secondary | ICD-10-CM | POA: Diagnosis not present

## 2022-04-12 DIAGNOSIS — K219 Gastro-esophageal reflux disease without esophagitis: Secondary | ICD-10-CM | POA: Diagnosis not present

## 2022-04-12 NOTE — Telephone Encounter (Signed)
Dr. Marius Ditch wanted to see if patient could do his procedures this week or next week. She states PCP has already stooped the Warfarin. Called and left a message for call back

## 2022-04-12 NOTE — Telephone Encounter (Signed)
Patient states he can not do the procedures sooner because he has already made arrangements in January for someone to take him to the procedures. He states he can not come today for lab work but will come tomorrow morning

## 2022-04-12 NOTE — Telephone Encounter (Signed)
-----   Message from Lin Landsman, MD sent at 04/12/2022  1:57 PM EST ----- Let's also check CBC today.  He received 2 units of PRBCs in the ER on 12/15  RV

## 2022-04-12 NOTE — Patient Outreach (Signed)
  Care Coordination   Follow Up Visit Note   04/12/2022 Name: [name] MRN: [id number] DOB: [birth year]  [name] is a 66 y.o. year old male who sees [name] Bush, MD for primary care. I spoke with  [name] by phone today.  What matters to the patients health and wellness today?  Ongoing care coordination assistance.     Goals Addressed             This Visit's Progress    Patient states:  Care coordination follow up for durable medical equipment/ community resources       Care Coordination Interventions: Evaluation of current treatment plan related to health conditions and patient's adherence to plan as established by provide:  Per chart review and assessment patient reports having recent follow up at ED due to dizziness and weakness.  Chart review reports patients Hgb at 7.3.  Patient states he declined hospital admission.  Currently home stating he does not feel any worse than he did and still gets tired quickly. Patient states he called [facility] home health nurse to let know he was able to resume services since out of the ED.  He states he is not sure whether services will resume without order from primary provider.  Call to [place] home health nurse, [name] to collaborate regarding patients ongoing home health services.   [name] states she contacted patients home health physical therapist [name] to let her know patient is able to resume services.   [name] states she will also call patient today to see if he is available for a nurse visit from her today. [name] informed that patient given contact information for transportation resources and that patient is calling primary care provider office today to schedule ED follow up visit.  Advised to call and scheduled post ED follow up visit with primary care provider:  Patient scheduled follow up visit with primary provider for 04/20/22.  Reviewed scheduled/upcoming provider appointments:  Follow up with [name] on   04/23/22 Discussed plans with patient for ongoing care management follow up and provided patient with direct contact information for care management team Patient updated on transportation resources:  Patient provided contact phone number for [facility] rides [phone]),  [employer]. [phone] / [phone], and [place] line [phone]). Patient states he recently got his car repaired and will therefore be able to drive to appointments when feeling up to it.            SDOH assessments and interventions completed:  No     Care Coordination Interventions:  Yes, provided   Follow up plan: Follow up call scheduled for 05/25/22    Encounter Outcome:  Pt. Visit Completed   Quinn Plowman RN,BSN,CCM [place] [phone] direct line

## 2022-04-13 DIAGNOSIS — D509 Iron deficiency anemia, unspecified: Secondary | ICD-10-CM | POA: Diagnosis not present

## 2022-04-14 ENCOUNTER — Telehealth: Payer: Self-pay

## 2022-04-14 DIAGNOSIS — Z7984 Long term (current) use of oral hypoglycemic drugs: Secondary | ICD-10-CM | POA: Diagnosis not present

## 2022-04-14 DIAGNOSIS — E785 Hyperlipidemia, unspecified: Secondary | ICD-10-CM | POA: Diagnosis not present

## 2022-04-14 DIAGNOSIS — I25119 Atherosclerotic heart disease of native coronary artery with unspecified angina pectoris: Secondary | ICD-10-CM | POA: Diagnosis not present

## 2022-04-14 DIAGNOSIS — F32A Depression, unspecified: Secondary | ICD-10-CM | POA: Diagnosis not present

## 2022-04-14 DIAGNOSIS — E876 Hypokalemia: Secondary | ICD-10-CM | POA: Diagnosis not present

## 2022-04-14 DIAGNOSIS — I110 Hypertensive heart disease with heart failure: Secondary | ICD-10-CM | POA: Diagnosis not present

## 2022-04-14 DIAGNOSIS — Z5181 Encounter for therapeutic drug level monitoring: Secondary | ICD-10-CM | POA: Diagnosis not present

## 2022-04-14 DIAGNOSIS — I4891 Unspecified atrial fibrillation: Secondary | ICD-10-CM | POA: Diagnosis not present

## 2022-04-14 DIAGNOSIS — I872 Venous insufficiency (chronic) (peripheral): Secondary | ICD-10-CM | POA: Diagnosis not present

## 2022-04-14 DIAGNOSIS — I5042 Chronic combined systolic (congestive) and diastolic (congestive) heart failure: Secondary | ICD-10-CM | POA: Diagnosis not present

## 2022-04-14 DIAGNOSIS — M1990 Unspecified osteoarthritis, unspecified site: Secondary | ICD-10-CM | POA: Diagnosis not present

## 2022-04-14 DIAGNOSIS — G43909 Migraine, unspecified, not intractable, without status migrainosus: Secondary | ICD-10-CM | POA: Diagnosis not present

## 2022-04-14 DIAGNOSIS — Z87891 Personal history of nicotine dependence: Secondary | ICD-10-CM | POA: Diagnosis not present

## 2022-04-14 DIAGNOSIS — Z7901 Long term (current) use of anticoagulants: Secondary | ICD-10-CM | POA: Diagnosis not present

## 2022-04-14 DIAGNOSIS — E1165 Type 2 diabetes mellitus with hyperglycemia: Secondary | ICD-10-CM | POA: Diagnosis not present

## 2022-04-14 DIAGNOSIS — Z9981 Dependence on supplemental oxygen: Secondary | ICD-10-CM | POA: Diagnosis not present

## 2022-04-14 DIAGNOSIS — K219 Gastro-esophageal reflux disease without esophagitis: Secondary | ICD-10-CM | POA: Diagnosis not present

## 2022-04-14 DIAGNOSIS — I252 Old myocardial infarction: Secondary | ICD-10-CM | POA: Diagnosis not present

## 2022-04-14 DIAGNOSIS — Z9181 History of falling: Secondary | ICD-10-CM | POA: Diagnosis not present

## 2022-04-14 DIAGNOSIS — J441 Chronic obstructive pulmonary disease with (acute) exacerbation: Secondary | ICD-10-CM | POA: Diagnosis not present

## 2022-04-14 DIAGNOSIS — I420 Dilated cardiomyopathy: Secondary | ICD-10-CM | POA: Diagnosis not present

## 2022-04-14 DIAGNOSIS — G4733 Obstructive sleep apnea (adult) (pediatric): Secondary | ICD-10-CM | POA: Diagnosis not present

## 2022-04-14 LAB — CBC
Hematocrit: 33.2 % — ABNORMAL LOW (ref 37.5–51.0)
Hemoglobin: 9.5 g/dL — ABNORMAL LOW (ref 13.0–17.7)
MCH: 21.2 pg — ABNORMAL LOW (ref 26.6–33.0)
MCHC: 28.6 g/dL — ABNORMAL LOW (ref 31.5–35.7)
MCV: 74 fL — ABNORMAL LOW (ref 79–97)
Platelets: 464 10*3/uL — ABNORMAL HIGH (ref 150–450)
RBC: 4.49 x10E6/uL (ref 4.14–5.80)
RDW: 22.6 % — ABNORMAL HIGH (ref 11.6–15.4)
WBC: 12.5 10*3/uL — ABNORMAL HIGH (ref 3.4–10.8)

## 2022-04-14 NOTE — Telephone Encounter (Signed)
-----   Message from Lin Landsman, MD sent at 04/14/2022  4:24 PM EST ----- Please inform patient that his hemoglobin is better and responded well to blood transfusion.  I will see him for upper endoscopy as scheduled  RV

## 2022-04-14 NOTE — Telephone Encounter (Signed)
Called and left a message for call back  

## 2022-04-14 NOTE — Telephone Encounter (Signed)
Contacted [name], RN [facility] nurse with [facility], to advise pt is still holding warfarin and does not need INR checked this week. [name] reports she still sees the pt weekly. [name] verbalized understanding.

## 2022-04-15 DIAGNOSIS — G4733 Obstructive sleep apnea (adult) (pediatric): Secondary | ICD-10-CM | POA: Diagnosis not present

## 2022-04-15 DIAGNOSIS — J9601 Acute respiratory failure with hypoxia: Secondary | ICD-10-CM | POA: Diagnosis not present

## 2022-04-15 NOTE — Telephone Encounter (Signed)
Patient verbalized understanding of results  

## 2022-04-20 ENCOUNTER — Encounter: Payer: Self-pay | Admitting: Family Medicine

## 2022-04-20 ENCOUNTER — Ambulatory Visit (INDEPENDENT_AMBULATORY_CARE_PROVIDER_SITE_OTHER): Payer: Medicare Other | Admitting: Family Medicine

## 2022-04-20 VITALS — BP 116/62 | HR 62 | Temp 97.2°F | Ht 71.0 in | Wt 239.4 lb

## 2022-04-20 DIAGNOSIS — E1169 Type 2 diabetes mellitus with other specified complication: Secondary | ICD-10-CM | POA: Diagnosis not present

## 2022-04-20 DIAGNOSIS — I2510 Atherosclerotic heart disease of native coronary artery without angina pectoris: Secondary | ICD-10-CM | POA: Diagnosis not present

## 2022-04-20 DIAGNOSIS — D649 Anemia, unspecified: Secondary | ICD-10-CM

## 2022-04-20 DIAGNOSIS — J9611 Chronic respiratory failure with hypoxia: Secondary | ICD-10-CM

## 2022-04-20 DIAGNOSIS — I4819 Other persistent atrial fibrillation: Secondary | ICD-10-CM

## 2022-04-20 DIAGNOSIS — I5032 Chronic diastolic (congestive) heart failure: Secondary | ICD-10-CM

## 2022-04-20 DIAGNOSIS — J9612 Chronic respiratory failure with hypercapnia: Secondary | ICD-10-CM

## 2022-04-20 DIAGNOSIS — G4733 Obstructive sleep apnea (adult) (pediatric): Secondary | ICD-10-CM | POA: Diagnosis not present

## 2022-04-20 DIAGNOSIS — D508 Other iron deficiency anemias: Secondary | ICD-10-CM

## 2022-04-20 LAB — CBC WITH DIFFERENTIAL/PLATELET
Basophils Absolute: 0.1 10*3/uL (ref 0.0–0.1)
Basophils Relative: 0.6 % (ref 0.0–3.0)
Eosinophils Absolute: 0.2 10*3/uL (ref 0.0–0.7)
Eosinophils Relative: 1.6 % (ref 0.0–5.0)
HCT: 29.3 % — ABNORMAL LOW (ref 39.0–52.0)
Hemoglobin: 8.9 g/dL — ABNORMAL LOW (ref 13.0–17.0)
Lymphocytes Relative: 12 % (ref 12.0–46.0)
Lymphs Abs: 1.5 10*3/uL (ref 0.7–4.0)
MCHC: 30.4 g/dL (ref 30.0–36.0)
MCV: 71 fl — ABNORMAL LOW (ref 78.0–100.0)
Monocytes Absolute: 1.1 10*3/uL — ABNORMAL HIGH (ref 0.1–1.0)
Monocytes Relative: 8.9 % (ref 3.0–12.0)
Neutro Abs: 9.7 10*3/uL — ABNORMAL HIGH (ref 1.4–7.7)
Neutrophils Relative %: 76.9 % (ref 43.0–77.0)
Platelets: 382 10*3/uL (ref 150.0–400.0)
RBC: 4.13 Mil/uL — ABNORMAL LOW (ref 4.22–5.81)
RDW: 25.8 % — ABNORMAL HIGH (ref 11.5–15.5)
WBC: 12.6 10*3/uL — ABNORMAL HIGH (ref 4.0–10.5)

## 2022-04-20 LAB — HEMOGLOBIN A1C: Hgb A1c MFr Bld: 6.9 % — ABNORMAL HIGH (ref 4.6–6.5)

## 2022-04-20 LAB — BASIC METABOLIC PANEL
BUN: 14 mg/dL (ref 6–23)
CO2: 42 mEq/L — ABNORMAL HIGH (ref 19–32)
Calcium: 10.1 mg/dL (ref 8.4–10.5)
Chloride: 86 mEq/L — ABNORMAL LOW (ref 96–112)
Creatinine, Ser: 1.18 mg/dL (ref 0.40–1.50)
GFR: 64.26 mL/min (ref >60.00)
Glucose, Bld: 116 mg/dL — ABNORMAL HIGH (ref 70–99)
Potassium: 4 mEq/L (ref 3.5–5.1)
Sodium: 138 mEq/L (ref 135–145)

## 2022-04-20 NOTE — Patient Instructions (Signed)
Labs today  Continue current medicines, stay off warfarin until colonoscopy.

## 2022-04-20 NOTE — Progress Notes (Unsigned)
Patient ID: WASSIM KIRKSEY, male    DOB: 05/14/1955, 66 y.o.   MRN: [id number]  This visit was conducted in person.  BP 116/62   Pulse 62   Temp (!) 97.2 F (36.2 C) (Temporal)   Ht _0  (1.803 m)   Wt 239 lb 6.4 oz (108.6 kg)   SpO2 94%   BMI 33.39 kg/m    CC: ER f/u visit  Subjective:   HPI: TEIGEN BELLIN is a 66 y.o. male presenting on 04/20/2022 for Hospitalization Follow-up (Seen on 04/09/22 at [facility], dx GI hemorrhage; anemia. )   Upcoming hematology appt with Dr Janese Banks 04/23/2022 - will discuss iron infusion.   Saw GI earlier on 04/08/2022, records reviewed. Insurance would not approve video capsule endoscopy without first repeating EGD/colonoscopy - planned for 04/28/2022. Sent to ER for transfusion of symptomatic anemia to 6.7, improved after 2 units to 9.5 on discharge from ER. Hgb after transfusion was 9.5. Started on fusion +1 pill QOD. He continues pantoprazole 75m BID.   Pt states [facility]. Per [facility]where, it seems he did complete this 09/2021. I don't have access to those records - I have requested capsule endoscopy results. [facility]:  The patient swallowed the capsule without difficulty. The esophageal mucosa was visualized however there appears to be severe dysmotility or stricture as the capsule did not pass in the stomach until 3 hours 45 minutes into the study. The capsule appeared to be in the same area. Sensation of the stomach was poor. Food contents obstructed the view. The small bowel and colonic mucosa were not visualized.  Was recommended barium swallow vs rpt EGD which he refused at that time.   Chronic diastolic CHF - now followed by CHF clinic on lasix 818mBID, farxiga 1024maily, metolazone 2.5mg42mekly.   Using oxygen at night time, not during the day.  Ambulatory pulse ox 04/02/2022 - O2 drops to 89% on RA with ambulation.   He did home oxygen test via [facility] - normal per patient.  He will look into mobility company for power vehicle forms.   Denies pain. Mild constipation and nausea this morning. Tolerating fusion iron better than oral iron - will continue this.      Relevant past medical, surgical, family and social history reviewed and updated as indicated. Interim medical history since our last visit reviewed. Allergies and medications reviewed and updated. Outpatient Medications Prior to Visit  Medication Sig Dispense Refill   ACCU-CHEK FASTCLIX LANCETS MISC Check blood sugar once daily and as instructed. Dx 250.00 100 each 3   albuterol (VENTOLIN HFA) 108 (90 Base) MCG/ACT inhaler Inhale 2 puffs into the lungs every 6 (six) hours as needed for wheezing or shortness of breath. 8.5 each 6   amiodarone (PACERONE) 200 MG tablet Take 1 tablet (200 mg total) by mouth daily. 90 tablet 3   Blood Glucose Monitoring Suppl (BLOOD GLUCOSE MONITOR SYSTEM) W/DEVICE KIT by Does not apply route. Use to check sugar once daily and as needed Dx: E11.9 **ONE TOUCH VERIO**     Budeson-Glycopyrrol-Formoterol (BREZTRI AEROSPHERE) 160-9-4.8 MCG/ACT AERO Inhale 2 puffs into the lungs 2 (two) times daily. 32.1 g 3   carvedilol (COREG) 6.25 MG tablet TAKE 1 TABLET BY MOUTH TWICE A DAY WITH FOOD 180 tablet 0   Cholecalciferol (VITAMIN D) 50 MCG (2000 UT) CAPS Take 1 capsule (  2,000 Units total) by mouth daily. 30 capsule    dapagliflozin propanediol (FARXIGA) 10 MG TABS tablet Take 1 tablet (10 mg total) by mouth daily before breakfast. 90 tablet 3   Docusate Calcium (STOOL SOFTENER PO) Take 1 tablet by mouth daily.     Fe Fum-Fe Poly-Vit C-Lactobac (FUSION PO) Take by mouth. Patient states received sample.  Unsure of mg.     FLUoxetine (PROZAC) 40 MG capsule Take 1 capsule (40 mg total) by mouth daily. 30 capsule 6   fluticasone (FLONASE) 50 MCG/ACT nasal spray Place 2 sprays into both nostrils daily. 48 mL 3   furosemide  (LASIX) 80 MG tablet Take 80 mg by mouth 2 (two) times daily.     glimepiride (AMARYL) 4 MG tablet Take 1 tablet (4 mg total) by mouth daily with breakfast. 90 tablet 1   glucose blood (ONETOUCH VERIO) test strip Check blood sugar 3 times a day 300 strip 3   ipratropium-albuterol (DUONEB) 0.5-2.5 (3) MG/3ML SOLN TAKE 3 MLS BY NEBULIZATION EVERY 6 (SIX) HOURS AS NEEDED (SEVERE SHORTNESS OF BREATH/WHEEZING). 360 mL 0   metFORMIN (GLUCOPHAGE) 1000 MG tablet Take 1 tablet (1,000 mg total) by mouth 2 (two) times daily with a meal. 180 tablet 1   metolazone (ZAROXOLYN) 2.5 MG tablet Take 1 tablet (2.5 mg total) by mouth once a week.     montelukast (SINGULAIR) 10 MG tablet TAKE 1 TABLET BY MOUTH EVERYDAY AT BEDTIME 90 tablet 3   pantoprazole (PROTONIX) 40 MG tablet TAKE 1 TABLET (40 MG TOTAL) BY MOUTH TWICE A DAY BEFORE MEALS 180 tablet 0   potassium chloride (KLOR-CON M) 10 MEQ tablet Take 1 tablet (10 mEq total) by mouth daily. And an extra one on the days you take metolazone 38 tablet 5   Probiotic Product (PROBIOTIC DAILY PO) Take 1 tablet by mouth daily.     Saw Palmetto 450 MG CAPS Take 1 capsule (450 mg total) by mouth daily.  0   simvastatin (ZOCOR) 20 MG tablet TAKE 1 TABLET BY MOUTH EVERY DAY IN THE EVENING 90 tablet 0   Turmeric (QC TUMERIC COMPLEX PO) Take by mouth.     vitamin B-12 (CYANOCOBALAMIN) 1000 MCG tablet Take 1 tablet (1,000 mcg total) by mouth every Monday, Wednesday, and Friday.     warfarin (COUMADIN) 5 MG tablet TAKE 1/2 TABLET BY MOUTH DAILY EXCEPT TAKE NO COUMADIN ON SUNDAY OR AS DIRECTED BY ANTICOAGULATION CLINIC 60 tablet 1   No facility-administered medications prior to visit.     Per HPI unless specifically indicated in ROS section below Review of Systems  Objective:  BP 116/62   Pulse 62   Temp (!) 97.2 F (36.2 C) (Temporal)   Ht _0  (1.803 m)   Wt 239 lb 6.4 oz (108.6 kg)   SpO2 94%   BMI 33.39 kg/m   Wt Readings from Last 3 Encounters:  04/20/22 239  lb 6.4 oz (108.6 kg)  04/09/22 245 lb 6 oz (111.3 kg)  04/08/22 241 lb 8 oz (109.5 kg)      Physical Exam Vitals and nursing note reviewed.  Constitutional:      Appearance: Normal appearance. He is not ill-appearing.  HENT:     Head: Normocephalic and atraumatic.     Mouth/Throat:     Comments: Wearing mask Eyes:     Extraocular Movements: Extraocular movements intact.     Conjunctiva/sclera: Conjunctivae normal.     Pupils: Pupils are equal, round, and reactive to light.  Cardiovascular:     Rate and Rhythm: Normal rate and regular rhythm.     Pulses: Normal pulses.     Heart sounds: Normal heart sounds. No murmur heard. Pulmonary:     Effort: Pulmonary effort is normal. No respiratory distress.     Breath sounds: Normal breath sounds. No wheezing, rhonchi or rales.  Abdominal:     General: Abdomen is protuberant. Bowel sounds are normal.     Tenderness: There is no abdominal tenderness. There is no guarding or rebound.  Musculoskeletal:     Right lower leg: No edema.     Left lower leg: No edema.  Skin:    General: Skin is warm and dry.  Neurological:     Mental Status: He is alert.  Psychiatric:        Mood and Affect: Mood normal.        Behavior: Behavior normal.       Results for orders placed or performed in visit on 04/20/22  CBC with Differential/Platelet  Result Value Ref Range   WBC 12.6 (H) 4.0 - 10.5 K/uL   RBC 4.13 (L) 4.22 - 5.81 Mil/uL   Hemoglobin 8.9 Repeated and verified X2. (L) 13.0 - 17.0 g/dL   HCT 29.3 (L) 39.0 - 52.0 %   MCV 71.0 (L) 78.0 - 100.0 fl   MCHC 30.4 30.0 - 36.0 g/dL   RDW 25.8 (H) 11.5 - 15.5 %   Platelets 382.0 150.0 - 400.0 K/uL   Neutrophils Relative % 76.9 43.0 - 77.0 %   Lymphocytes Relative 12.0 12.0 - 46.0 %   Monocytes Relative 8.9 3.0 - 12.0 %   Eosinophils Relative 1.6 0.0 - 5.0 %   Basophils Relative 0.6 0.0 - 3.0 %   Neutro Abs 9.7 (H) 1.4 - 7.7 K/uL   Lymphs Abs 1.5 0.7 - 4.0 K/uL   Monocytes Absolute 1.1 (H)  0.1 - 1.0 K/uL   Eosinophils Absolute 0.2 0.0 - 0.7 K/uL   Basophils Absolute 0.1 0.0 - 0.1 K/uL  Hemoglobin A1c  Result Value Ref Range   Hgb A1c MFr Bld 6.9 (H) 4.6 - 6.5 %  Basic metabolic panel  Result Value Ref Range   Sodium 138 135 - 145 mEq/L   Potassium 4.0 3.5 - 5.1 mEq/L   Chloride 86 (L) 96 - 112 mEq/L   CO2 42 (H) 19 - 32 mEq/L   Glucose, Bld 116 (H) 70 - 99 mg/dL   BUN 14 6 - 23 mg/dL   Creatinine, Ser 1.18 0.40 - 1.50 mg/dL   GFR 64.26 >60.00 mL/min   Calcium 10.1 8.4 - 10.5 mg/dL    Assessment & Plan:   Problem List Items Addressed This Visit     Obstructive sleep apnea    Still pending eval of this by pulm - lack of transportation limits scheduling sleep study.       Coronary artery disease    Goal keep Hgb >7.  Have restarted aspirin 27m daily while coumadin is on hold.       Relevant Medications   aspirin EC 81 MG tablet   Type 2 diabetes mellitus with other specified complication (HCC)    Update A1c on farxiga 152mdaily, glimepiride 58m3maily, and metformin 1000m5md.       Relevant Medications   aspirin EC 81 MG tablet   Other Relevant Orders   Hemoglobin A1c (Completed)   Basic metabolic panel (Completed)   Chronic diastolic heart failure (HCC)[place]  Seeing CHF clinic ,appreciate their care. Seems euvolemic.       Relevant Medications   aspirin EC 81 MG tablet   Iron deficiency anemia - Primary    Symptomatic anemia s/p 2u pRBC transfusion 01/2022, again 2u pRBC transfusion 03/2022.  He is tolerating fusion + iron pills daily better than oral iron. I did recommend he take QOD dosing as recommended by GI.       Atrial fibrillation ([place])    Currently off coumadin due to recent symptomatic anemia and concern for ongoing GI bleed.  He continues amiodarone 262m daily and carvedilol 6.210mtwice daily.  Will need to restart anticoagulation as soon as blood loss etiology ascertained.       Relevant Medications   aspirin EC 81 MG tablet    Chronic respiratory failure with hypoxia and hypercapnia (HCC)    Maintaining oxygen levels well since blood transfusions.  Continue nocturnal oxygen supplementation.       Symptomatic anemia    Update CBC after 2nd 2u pRBC transfusion last week.       Relevant Orders   CBC with Differential/Platelet (Completed)     Meds ordered this encounter  Medications   aspirin EC 81 MG tablet    Sig: Take 1 tablet (81 mg total) by mouth daily. Swallow whole.    Dispense:  30 tablet    Refill:  12   Orders Placed This Encounter  Procedures   CBC with Differential/Platelet   Hemoglobin A1M7W Basic metabolic panel     Patient Instructions  Labs today  Continue current medicines, stay off warfarin until colonoscopy.   Follow up plan: Return if symptoms worsen or fail to improve.  JaRia BushMD

## 2022-04-21 DIAGNOSIS — I5042 Chronic combined systolic (congestive) and diastolic (congestive) heart failure: Secondary | ICD-10-CM | POA: Diagnosis not present

## 2022-04-21 DIAGNOSIS — I420 Dilated cardiomyopathy: Secondary | ICD-10-CM | POA: Diagnosis not present

## 2022-04-21 DIAGNOSIS — I872 Venous insufficiency (chronic) (peripheral): Secondary | ICD-10-CM | POA: Diagnosis not present

## 2022-04-21 DIAGNOSIS — F32A Depression, unspecified: Secondary | ICD-10-CM | POA: Diagnosis not present

## 2022-04-21 DIAGNOSIS — E785 Hyperlipidemia, unspecified: Secondary | ICD-10-CM | POA: Diagnosis not present

## 2022-04-21 DIAGNOSIS — Z7901 Long term (current) use of anticoagulants: Secondary | ICD-10-CM | POA: Diagnosis not present

## 2022-04-21 DIAGNOSIS — I110 Hypertensive heart disease with heart failure: Secondary | ICD-10-CM | POA: Diagnosis not present

## 2022-04-21 DIAGNOSIS — I252 Old myocardial infarction: Secondary | ICD-10-CM | POA: Diagnosis not present

## 2022-04-21 DIAGNOSIS — G43909 Migraine, unspecified, not intractable, without status migrainosus: Secondary | ICD-10-CM | POA: Diagnosis not present

## 2022-04-21 DIAGNOSIS — J441 Chronic obstructive pulmonary disease with (acute) exacerbation: Secondary | ICD-10-CM | POA: Diagnosis not present

## 2022-04-21 DIAGNOSIS — G4733 Obstructive sleep apnea (adult) (pediatric): Secondary | ICD-10-CM | POA: Diagnosis not present

## 2022-04-21 DIAGNOSIS — I25119 Atherosclerotic heart disease of native coronary artery with unspecified angina pectoris: Secondary | ICD-10-CM | POA: Diagnosis not present

## 2022-04-21 DIAGNOSIS — E876 Hypokalemia: Secondary | ICD-10-CM | POA: Diagnosis not present

## 2022-04-21 DIAGNOSIS — Z87891 Personal history of nicotine dependence: Secondary | ICD-10-CM | POA: Diagnosis not present

## 2022-04-21 DIAGNOSIS — Z7984 Long term (current) use of oral hypoglycemic drugs: Secondary | ICD-10-CM | POA: Diagnosis not present

## 2022-04-21 DIAGNOSIS — E1165 Type 2 diabetes mellitus with hyperglycemia: Secondary | ICD-10-CM | POA: Diagnosis not present

## 2022-04-21 DIAGNOSIS — M1990 Unspecified osteoarthritis, unspecified site: Secondary | ICD-10-CM | POA: Diagnosis not present

## 2022-04-21 DIAGNOSIS — Z9981 Dependence on supplemental oxygen: Secondary | ICD-10-CM | POA: Diagnosis not present

## 2022-04-21 DIAGNOSIS — K219 Gastro-esophageal reflux disease without esophagitis: Secondary | ICD-10-CM | POA: Diagnosis not present

## 2022-04-21 DIAGNOSIS — Z5181 Encounter for therapeutic drug level monitoring: Secondary | ICD-10-CM | POA: Diagnosis not present

## 2022-04-21 DIAGNOSIS — Z9181 History of falling: Secondary | ICD-10-CM | POA: Diagnosis not present

## 2022-04-21 DIAGNOSIS — I4891 Unspecified atrial fibrillation: Secondary | ICD-10-CM | POA: Diagnosis not present

## 2022-04-22 ENCOUNTER — Telehealth: Payer: Self-pay | Admitting: Family Medicine

## 2022-04-22 MED ORDER — ASPIRIN 81 MG PO TBEC: 81.0000 mg | DELAYED_RELEASE_TABLET | Freq: Every day | ORAL | 12 refills | Status: DC

## 2022-04-22 NOTE — Assessment & Plan Note (Signed)
Update CBC after 2nd 2u pRBC transfusion last week.

## 2022-04-22 NOTE — Assessment & Plan Note (Signed)
Symptomatic anemia s/p 2u pRBC transfusion 01/2022, again 2u pRBC transfusion 03/2022.  He is tolerating fusion + iron pills daily better than oral iron. I did recommend he take QOD dosing as recommended by GI.

## 2022-04-22 NOTE — Telephone Encounter (Signed)
Patient returned call to [facility] regarding his lab results, I read off Dr Darnell Level lab note for him,and he approved.

## 2022-04-22 NOTE — Assessment & Plan Note (Signed)
Seeing CHF clinic ,appreciate their care. Seems euvolemic.

## 2022-04-22 NOTE — Assessment & Plan Note (Signed)
Still pending eval of this by pulm - lack of transportation limits scheduling sleep study.

## 2022-04-22 NOTE — Assessment & Plan Note (Signed)
Update A1c on farxiga '10mg'$  daily, glimepiride '4mg'$  daily, and metformin '1000mg'$  bid.

## 2022-04-22 NOTE — Assessment & Plan Note (Addendum)
Goal keep Hgb >7.  Have restarted aspirin '81mg'$  daily while coumadin is on hold.

## 2022-04-22 NOTE — Assessment & Plan Note (Addendum)
Currently off coumadin due to recent symptomatic anemia and concern for ongoing GI bleed.  He continues amiodarone '200mg'$  daily and carvedilol 6.'25mg'$  twice daily.  Will need to restart anticoagulation as soon as blood loss etiology ascertained.

## 2022-04-22 NOTE — Telephone Encounter (Signed)
Noted. (See Labs-04/20/22 Results Notes.)

## 2022-04-22 NOTE — Assessment & Plan Note (Signed)
Maintaining oxygen levels well since blood transfusions.  Continue nocturnal oxygen supplementation.

## 2022-04-23 ENCOUNTER — Encounter: Payer: Self-pay | Admitting: Oncology

## 2022-04-23 ENCOUNTER — Inpatient Hospital Stay: Payer: Medicare Other | Attending: Oncology

## 2022-04-23 ENCOUNTER — Inpatient Hospital Stay: Payer: Medicare Other

## 2022-04-23 ENCOUNTER — Inpatient Hospital Stay (HOSPITAL_BASED_OUTPATIENT_CLINIC_OR_DEPARTMENT_OTHER): Payer: Medicare Other | Admitting: Oncology

## 2022-04-23 VITALS — BP 108/66 | HR 103 | Resp 16

## 2022-04-23 VITALS — BP 114/72 | HR 61 | Temp 98.7°F | Resp 16 | Ht 71.0 in | Wt 232.0 lb

## 2022-04-23 DIAGNOSIS — G4733 Obstructive sleep apnea (adult) (pediatric): Secondary | ICD-10-CM | POA: Diagnosis not present

## 2022-04-23 DIAGNOSIS — E782 Mixed hyperlipidemia: Secondary | ICD-10-CM | POA: Diagnosis not present

## 2022-04-23 DIAGNOSIS — I420 Dilated cardiomyopathy: Secondary | ICD-10-CM | POA: Diagnosis not present

## 2022-04-23 DIAGNOSIS — D509 Iron deficiency anemia, unspecified: Secondary | ICD-10-CM

## 2022-04-23 DIAGNOSIS — D508 Other iron deficiency anemias: Secondary | ICD-10-CM

## 2022-04-23 DIAGNOSIS — I4892 Unspecified atrial flutter: Secondary | ICD-10-CM | POA: Diagnosis not present

## 2022-04-23 DIAGNOSIS — I4891 Unspecified atrial fibrillation: Secondary | ICD-10-CM | POA: Diagnosis not present

## 2022-04-23 DIAGNOSIS — I5022 Chronic systolic (congestive) heart failure: Secondary | ICD-10-CM | POA: Diagnosis not present

## 2022-04-23 DIAGNOSIS — I2510 Atherosclerotic heart disease of native coronary artery without angina pectoris: Secondary | ICD-10-CM | POA: Diagnosis not present

## 2022-04-23 LAB — FERRITIN: Ferritin: 7 ng/mL — ABNORMAL LOW (ref 24–336)

## 2022-04-23 LAB — CBC WITH DIFFERENTIAL/PLATELET
Abs Immature Granulocytes: 0.04 10*3/uL (ref 0.00–0.07)
Basophils Absolute: 0.1 10*3/uL (ref 0.0–0.1)
Basophils Relative: 1 %
Eosinophils Absolute: 0.3 10*3/uL (ref 0.0–0.5)
Eosinophils Relative: 2 %
HCT: 31.4 % — ABNORMAL LOW (ref 39.0–52.0)
Hemoglobin: 8.9 g/dL — ABNORMAL LOW (ref 13.0–17.0)
Immature Granulocytes: 0 %
Lymphocytes Relative: 14 %
Lymphs Abs: 1.6 10*3/uL (ref 0.7–4.0)
MCH: 21.5 pg — ABNORMAL LOW (ref 26.0–34.0)
MCHC: 28.3 g/dL — ABNORMAL LOW (ref 30.0–36.0)
MCV: 76 fL — ABNORMAL LOW (ref 80.0–100.0)
Monocytes Absolute: 1.1 10*3/uL — ABNORMAL HIGH (ref 0.1–1.0)
Monocytes Relative: 9 %
Neutro Abs: 8.9 10*3/uL — ABNORMAL HIGH (ref 1.7–7.7)
Neutrophils Relative %: 74 %
Platelets: 321 10*3/uL (ref 150–400)
RBC: 4.13 MIL/uL — ABNORMAL LOW (ref 4.22–5.81)
RDW: 22.7 % — ABNORMAL HIGH (ref 11.5–15.5)
WBC: 11.9 10*3/uL — ABNORMAL HIGH (ref 4.0–10.5)
nRBC: 0 % (ref 0.0–0.2)

## 2022-04-23 LAB — IRON AND TIBC
Iron: 58 ug/dL (ref 45–182)
Saturation Ratios: 13 % — ABNORMAL LOW (ref 17.9–39.5)
TIBC: 441 ug/dL (ref 250–450)
UIBC: 383 ug/dL

## 2022-04-23 MED ORDER — SODIUM CHLORIDE 0.9 % IV SOLN
INTRAVENOUS | Status: DC
  Filled 2022-04-23: qty 250

## 2022-04-23 MED ORDER — SODIUM CHLORIDE 0.9 % IV SOLN
510.0000 mg | INTRAVENOUS | Status: DC
  Administered 2022-04-23: 510 mg via INTRAVENOUS
  Filled 2022-04-23: qty 510

## 2022-04-23 NOTE — Patient Instructions (Signed)

## 2022-04-23 NOTE — Progress Notes (Signed)
Hematology/Oncology Consult note [facility]  [phone] [phone]  Patient Care Team: [name], MD as PCP - General (Family Medicine) Vickie Epley, MD as PCP - Electrophysiology (Cardiology) Thompson Grayer, MD as Attending Physician (Cardiology) Corey Skains, MD as Referring Physician (Internal Medicine) Charlton Haws, [facility] as Pharmacist (Pharmacist) Sindy Guadeloupe, MD as Consulting Physician (Oncology) Sindy Guadeloupe, MD as Consulting Physician (Oncology)   Name of the patient: [name]  [id number]  June 18, 1955   Date of visit: 04/23/22  Diagnosis-iron deficiency anemia  Chief complaint/ Reason for visit-routine follow-up of iron deficiency anemia  Heme/Onc history:  Patient is a 66 year old male with a past medical history significant for coronary artery disease, fatty liver, COPD among other medical problems who has been referred to [place] for iron deficiency anemia.  His most recent CBC from 07/15/2021 showed H&H of 10.3/35.2 with an MCV of 69 with a normal white count and platelet count.  Hemoglobin had drifted down from 14 a year ago to 12.74 months ago to 10.3 presently.  Iron studies showed a low iron saturation of 3.2% and elevated TIBC with a ferritin level of 6.4.  B12 levels were elevated at 1500.  TSH normal at 2.8.  Patient has recently undergone EGD and colonoscopy by Dr. Allen Norris.  EGD showed no abnormality colonoscopy showed 4 mm ulcer in the descending colon which was biopsied and was negative for malignancy.      Interval history-reports ongoing fatigue.  Denies any blood loss in his stool or urine.  He woke up with the swollen left eyelid this morning.  Also reports some itching around his face.  ECOG PS- 2 Pain scale- 0   Review of systems- Review of Systems  Constitutional:  Negative for chills, fever, malaise/fatigue and weight loss.  HENT:  Negative for congestion, ear discharge and  nosebleeds.        Left eyelid swelling  Eyes:  Negative for blurred vision.  Respiratory:  Negative for cough, hemoptysis, sputum production, shortness of breath and wheezing.   Cardiovascular:  Negative for chest pain, palpitations, orthopnea and claudication.  Gastrointestinal:  Negative for abdominal pain, blood in stool, constipation, diarrhea, heartburn, melena, nausea and vomiting.  Genitourinary:  Negative for dysuria, flank pain, frequency, hematuria and urgency.  Musculoskeletal:  Negative for back pain, joint pain and myalgias.  Skin:  Negative for rash.  Neurological:  Negative for dizziness, tingling, focal weakness, seizures, weakness and headaches.  Endo/Heme/Allergies:  Does not bruise/bleed easily.  Psychiatric/Behavioral:  Negative for depression and suicidal ideas. The patient does not have insomnia.       Allergies  Allergen Reactions   Atorvastatin Other (See Comments)    Dizziness   Lisinopril Cough     Past Medical History:  Diagnosis Date   Abnormal drug screen 2015   MJ positive x3 - one more and we will stop prescribing ativan (08/2013).   Anemia    Angina    Anxiety    Arrhythmia    atrial fibrillation   Arthritis    CAD (coronary artery disease)    nonobstructive   Cannabis abuse    Chronic systolic CHF (congestive heart failure) ([place]) 2013   NYHA Class II/III   COPD (chronic obstructive pulmonary disease) ([place])    Diabetes type 2, uncontrolled    declines DSME   Dilated cardiomyopathy ([place]) 2013   now improved   Ex-smoker    Frequent headaches    History  of atrial fibrillation 2013   chronic, s/p ablation prior on coumadin   Hyperlipidemia    Hypertension    Migraine    Narcolepsy    Nonischemic cardiomyopathy ([place]) 2013   EF 25% per Dr Nehemiah Massed   Obesity    OSA (obstructive sleep apnea)    does not use CPAP - unable to tolerate   Seasonal allergies      Past Surgical History:  Procedure Laterality Date   ATRIAL FLUTTER  ABLATION N/A 09/21/2011   Procedure: ATRIAL FLUTTER ABLATION;  Surgeon: Thompson Grayer, MD;  Location: [facility] CATH LAB;  Service: Cardiovascular;  Laterality: N/A;   CARDIAC ELECTROPHYSIOLOGY [place] AND ABLATION  2013   for atrial flutter   CARDIOVASCULAR STRESS TEST  03/2012   ETT WNL Nehemiah Massed)   CARDIOVERSION N/A 08/12/2021   Procedure: CARDIOVERSION;  Surgeon: Corey Skains, MD;  Location: [facility] ORS;  Service: Cardiovascular;  Laterality: N/A;   COLONOSCOPY WITH PROPOFOL N/A 05/22/2020   TA, diverticulosis, descending colon ulcer biopsy WNL, int hem Allen Norris, Darren, MD)   ESOPHAGOGASTRODUODENOSCOPY (EGD) WITH PROPOFOL N/A 05/22/2020   WNL ([place])   US ECHOCARDIOGRAPHY  2014   EF 50%, nl LV fxn, RV nl size/function, mild mitral insuff    Social History   Socioeconomic History   Marital status: Single    Spouse name: Not on file   Number of children: 0   Years of education: Not on file   Highest education level: Not on file  Occupational History   Occupation: disable   Tobacco Use   Smoking status: Former    Packs/day: 1.00    Years: 31.00    Total pack years: 31.00    Types: Cigarettes    Quit date: 04/26/2018    Years since quitting: 3.9   Smokeless tobacco: Never  Vaping Use   Vaping Use: Never used  Substance and Sexual Activity   Alcohol use: No    Alcohol/week: 0.0 standard drinks of alcohol   Drug use: Yes    Types: Marijuana   Sexual activity: Not Currently  Other Topics Concern   Not on file  Social History Narrative   Pt lives in [place]   Divorced   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   Disability due to neck arthritis, anxiety and sleep apnea   Edu: HS   Activity: no regular exercise, mows lawn (riding and push)   Diet: good water, fruits/vegetables daily   Social Determinants of Health   Financial Resource Strain: Medium Risk (10/23/2021)   Overall Financial Resource Strain (CARDIA)    Difficulty of Paying Living Expenses: Somewhat hard  Food Insecurity:  No Food Insecurity (03/05/2022)   Hunger Vital Sign    Worried About Running Out of Food in the Last Year: Never true    Ran Out of Food in the Last Year: Never true  Transportation Needs: Unmet Transportation Needs (03/05/2022)   PRAPARE - Transportation    Lack of Transportation (Medical): Yes    Lack of Transportation (Non-Medical): Yes  Physical Activity: Inactive (09/24/2021)   Exercise Vital Sign    Days of Exercise per Week: 0 days    Minutes of Exercise per Session: 0 min  Stress: No Stress Concern Present (09/24/2021)   [place]    Feeling of Stress : Not at all  Social Connections: Not on file  Intimate Partner Violence: Not At Risk (07/09/2020)   Humiliation, Afraid, Rape, and Kick questionnaire    Fear of  Current or Ex-Partner: No    Emotionally Abused: No    Physically Abused: No    Sexually Abused: No    Family History  Problem Relation Age of Onset   Heart failure Father 50   Cancer Mother 38       NHL   Hypertension Maternal Grandfather    CAD Maternal Grandfather 35       MI   Diabetes Other        grandparents   Cancer Brother 70       lung (smoker)     Current Outpatient Medications:    ACCU-CHEK FASTCLIX LANCETS MISC, Check blood sugar once daily and as instructed. Dx 250.00, Disp: 100 each, Rfl: 3   albuterol (VENTOLIN HFA) 108 (90 Base) MCG/ACT inhaler, Inhale 2 puffs into the lungs every 6 (six) hours as needed for wheezing or shortness of breath., Disp: 8.5 each, Rfl: 6   amiodarone (PACERONE) 200 MG tablet, Take 1 tablet (200 mg total) by mouth daily., Disp: 90 tablet, Rfl: 3   aspirin EC 81 MG tablet, Take 1 tablet (81 mg total) by mouth daily. Swallow whole., Disp: 30 tablet, Rfl: 12   Blood Glucose Monitoring Suppl (BLOOD GLUCOSE MONITOR SYSTEM) W/DEVICE KIT, by Does not apply route. Use to check sugar once daily and as needed Dx: E11.9 **ONE TOUCH VERIO**, Disp: , Rfl:     Budeson-Glycopyrrol-Formoterol (BREZTRI AEROSPHERE) 160-9-4.8 MCG/ACT AERO, Inhale 2 puffs into the lungs 2 (two) times daily., Disp: 32.1 g, Rfl: 3   carvedilol (COREG) 6.25 MG tablet, TAKE 1 TABLET BY MOUTH TWICE A DAY WITH FOOD, Disp: 180 tablet, Rfl: 0   Cholecalciferol (VITAMIN D) 50 MCG (2000 UT) CAPS, Take 1 capsule (2,000 Units total) by mouth daily., Disp: 30 capsule, Rfl:    dapagliflozin propanediol (FARXIGA) 10 MG TABS tablet, Take 1 tablet (10 mg total) by mouth daily before breakfast., Disp: 90 tablet, Rfl: 3   Docusate Calcium (STOOL SOFTENER PO), Take 1 tablet by mouth daily., Disp: , Rfl:    Fe Fum-Fe Poly-Vit C-Lactobac (FUSION PO), Take by mouth. Patient states received sample.  Unsure of mg., Disp: , Rfl:    FLUoxetine (PROZAC) 40 MG capsule, Take 1 capsule (40 mg total) by mouth daily., Disp: 30 capsule, Rfl: 6   fluticasone (FLONASE) 50 MCG/ACT nasal spray, Place 2 sprays into both nostrils daily., Disp: 48 mL, Rfl: 3   furosemide (LASIX) 80 MG tablet, Take 80 mg by mouth 2 (two) times daily., Disp: , Rfl:    glimepiride (AMARYL) 4 MG tablet, Take 1 tablet (4 mg total) by mouth daily with breakfast., Disp: 90 tablet, Rfl: 1   glucose blood (ONETOUCH VERIO) test strip, Check blood sugar 3 times a day, Disp: 300 strip, Rfl: 3   ipratropium-albuterol (DUONEB) 0.5-2.5 (3) MG/3ML SOLN, TAKE 3 MLS BY NEBULIZATION EVERY 6 (SIX) HOURS AS NEEDED (SEVERE SHORTNESS OF BREATH/WHEEZING)., Disp: 360 mL, Rfl: 0   metFORMIN (GLUCOPHAGE) 1000 MG tablet, Take 1 tablet (1,000 mg total) by mouth 2 (two) times daily with a meal., Disp: 180 tablet, Rfl: 1   metolazone (ZAROXOLYN) 2.5 MG tablet, Take 1 tablet (2.5 mg total) by mouth once a week., Disp: , Rfl:    montelukast (SINGULAIR) 10 MG tablet, TAKE 1 TABLET BY MOUTH EVERYDAY AT BEDTIME, Disp: 90 tablet, Rfl: 3   pantoprazole (PROTONIX) 40 MG tablet, TAKE 1 TABLET (40 MG TOTAL) BY MOUTH TWICE A DAY BEFORE MEALS, Disp: 180 tablet, Rfl: 0  potassium chloride (KLOR-CON M) 10 MEQ tablet, Take 1 tablet (10 mEq total) by mouth daily. And an extra one on the days you take metolazone, Disp: 38 tablet, Rfl: 5   Probiotic Product (PROBIOTIC DAILY PO), Take 1 tablet by mouth daily., Disp: , Rfl:    Saw Palmetto 450 MG CAPS, Take 1 capsule (450 mg total) by mouth daily., Disp: , Rfl: 0   simvastatin (ZOCOR) 20 MG tablet, TAKE 1 TABLET BY MOUTH EVERY DAY IN THE EVENING, Disp: 90 tablet, Rfl: 0   Turmeric (QC TUMERIC COMPLEX PO), Take by mouth., Disp: , Rfl:    vitamin B-12 (CYANOCOBALAMIN) 1000 MCG tablet, Take 1 tablet (1,000 mcg total) by mouth every Monday, Wednesday, and Friday., Disp: , Rfl:    warfarin (COUMADIN) 5 MG tablet, TAKE 1/2 TABLET BY MOUTH DAILY EXCEPT TAKE NO COUMADIN ON SUNDAY OR AS DIRECTED BY ANTICOAGULATION CLINIC, Disp: 60 tablet, Rfl: 1  Physical exam:  Vitals:   04/23/22 1310  BP: 114/72  Pulse: 61  Resp: 16  Temp: 98.7 F (37.1 C)  TempSrc: Tympanic  SpO2: 96%  Weight: 232 lb (105.2 kg)  Height: _0  (1.803 m)   Physical Exam Eyes:     Comments: There is diffuse soft tissue swelling of the eyelid with mild erythema.  No evidence of erythema noted in the left eye.  Cardiovascular:     Rate and Rhythm: Normal rate and regular rhythm.     Heart sounds: Normal heart sounds.  Pulmonary:     Effort: Pulmonary effort is normal.     Breath sounds: Normal breath sounds.  Abdominal:     General: Bowel sounds are normal.     Palpations: Abdomen is soft.  Skin:    General: Skin is warm and dry.  Neurological:     Mental Status: He is alert and oriented to person, place, and time.         Latest Ref Rng & Units 04/20/2022   12:36 PM  CMP  Glucose 70 - 99 mg/dL 116   BUN 6 - 23 mg/dL 14   Creatinine 0.40 - 1.50 mg/dL 1.18   Sodium 135 - 145 mEq/L 138   Potassium 3.5 - 5.1 mEq/L 4.0   Chloride 96 - 112 mEq/L 86   CO2 19 - 32 mEq/L 42   Calcium 8.4 - 10.5 mg/dL 10.1       Latest Ref Rng & Units  04/23/2022   12:24 PM  CBC  WBC 4.0 - 10.5 K/uL 11.9   Hemoglobin 13.0 - 17.0 g/dL 8.9   Hematocrit 39.0 - 52.0 % 31.4   Platelets 150 - 400 K/uL 321     Assessment and plan- Patient is a 66 y.o. male here for routine follow-up of iron deficiencyAnemia   Patient last received IV iron in July 2023.  He was lost to follow-up since then.  Following iron infusion his hemoglobin had improved to 11.8.  He was again admitted to the hospital in October 2023 when his hemoglobin had dropped to 6.7.  Presently it is 8.9 with evidence of microcytosis.  His last ferritin about 3 weeks ago was 7.7.  Iron saturation 4.3%.  He would therefore benefit from IV iron.  Based on what his insurance approves he will either get Feraheme x 2 or Venofer x 5.  Discussed risks and benefits of IV iron including all but not limited to possible risk of infusion reactions.  Patient understands and agrees to proceed as planned.  Patient's last colonoscopy was in January 2022 which showed ulcer in the descending colon and granularity at the ileocecal valve which was biopsied and was not consistent with malignancy.  Upper endoscopy was normal.  He does not have any follow-up appointments with GI at this time.  He is scheduled for EGD and colonoscopy with Dr. Marius Ditch on 04/28/2022.  Left eyelid swelling: Symptoms appear allergic in etiology.  I have asked him to try as needed Benadryl over the next few days and if symptoms do not improve he will get in touch with Dr. Danise Mina.   Visit Diagnosis 1. Iron deficiency anemia, unspecified iron deficiency anemia type      Dr. Randa Evens, MD, MPH [facility] [phone] 04/23/2022 12:50 PM

## 2022-04-23 NOTE — Progress Notes (Signed)
Pt tolerated treatment w/o complaints.  VSS.  Pt refused 30 minute post observation period.  Pt understands to present to ED for signs/symptoms of reaction.

## 2022-04-26 ENCOUNTER — Other Ambulatory Visit: Payer: Self-pay | Admitting: Family Medicine

## 2022-04-27 ENCOUNTER — Encounter: Payer: Self-pay | Admitting: Gastroenterology

## 2022-04-27 ENCOUNTER — Other Ambulatory Visit: Payer: Medicare Other

## 2022-04-27 ENCOUNTER — Other Ambulatory Visit (HOSPITAL_BASED_OUTPATIENT_CLINIC_OR_DEPARTMENT_OTHER): Payer: Self-pay | Admitting: Family Medicine

## 2022-04-27 ENCOUNTER — Other Ambulatory Visit (INDEPENDENT_AMBULATORY_CARE_PROVIDER_SITE_OTHER): Payer: Medicare Other

## 2022-04-27 DIAGNOSIS — Z7901 Long term (current) use of anticoagulants: Secondary | ICD-10-CM | POA: Diagnosis not present

## 2022-04-27 DIAGNOSIS — D649 Anemia, unspecified: Secondary | ICD-10-CM | POA: Diagnosis not present

## 2022-04-27 LAB — BASIC METABOLIC PANEL
BUN: 16 mg/dL (ref 6–23)
CO2: 37 mEq/L — ABNORMAL HIGH (ref 19–32)
Calcium: 9.7 mg/dL (ref 8.4–10.5)
Chloride: 93 mEq/L — ABNORMAL LOW (ref 96–112)
Creatinine, Ser: 1.01 mg/dL (ref 0.40–1.50)
GFR: 77.44 mL/min (ref >60.00)
Glucose, Bld: 183 mg/dL — ABNORMAL HIGH (ref 70–99)
Potassium: 4.1 mEq/L (ref 3.5–5.1)
Sodium: 143 mEq/L (ref 135–145)

## 2022-04-27 LAB — CBC WITH DIFFERENTIAL/PLATELET
Basophils Absolute: 0.1 10*3/uL (ref 0.0–0.1)
Basophils Relative: 0.6 % (ref 0.0–3.0)
Eosinophils Absolute: 0.3 10*3/uL (ref 0.0–0.7)
Eosinophils Relative: 2.2 % (ref 0.0–5.0)
HCT: 28.8 % — ABNORMAL LOW (ref 39.0–52.0)
Hemoglobin: 8.8 g/dL — ABNORMAL LOW (ref 13.0–17.0)
Lymphocytes Relative: 9.3 % — ABNORMAL LOW (ref 12.0–46.0)
Lymphs Abs: 1.1 10*3/uL (ref 0.7–4.0)
MCHC: 30.6 g/dL (ref 30.0–36.0)
MCV: 73.6 fl — ABNORMAL LOW (ref 78.0–100.0)
Monocytes Absolute: 0.9 10*3/uL (ref 0.1–1.0)
Monocytes Relative: 7.1 % (ref 3.0–12.0)
Neutro Abs: 9.8 10*3/uL — ABNORMAL HIGH (ref 1.4–7.7)
Neutrophils Relative %: 80.8 % — ABNORMAL HIGH (ref 43.0–77.0)
Platelets: 283 10*3/uL (ref 150.0–400.0)
RBC: 3.91 Mil/uL — ABNORMAL LOW (ref 4.22–5.81)
RDW: 26.2 % — ABNORMAL HIGH (ref 11.5–15.5)
WBC: 12.2 10*3/uL — ABNORMAL HIGH (ref 4.0–10.5)

## 2022-04-27 LAB — PROTIME-INR
INR: 1 ratio (ref 0.8–1.0)
Prothrombin Time: 11.1 s (ref 9.6–13.1)

## 2022-04-28 ENCOUNTER — Encounter
Admission: RE | Disposition: A | Discharge status: Home / Self Care | Payer: Self-pay | Source: Home / Self Care | Attending: Gastroenterology

## 2022-04-28 ENCOUNTER — Ambulatory Visit
Admission: RE | Admit: 2022-04-28 | Discharge: 2022-04-28 | Disposition: A | Discharge status: Home / Self Care | Payer: Medicare Other | Attending: Gastroenterology | Admitting: Gastroenterology

## 2022-04-28 ENCOUNTER — Ambulatory Visit: Payer: Medicare Other | Admitting: Anesthesiology

## 2022-04-28 DIAGNOSIS — Z7984 Long term (current) use of oral hypoglycemic drugs: Secondary | ICD-10-CM | POA: Insufficient documentation

## 2022-04-28 DIAGNOSIS — Z8711 Personal history of peptic ulcer disease: Secondary | ICD-10-CM | POA: Insufficient documentation

## 2022-04-28 DIAGNOSIS — K2970 Gastritis, unspecified, without bleeding: Secondary | ICD-10-CM | POA: Diagnosis not present

## 2022-04-28 DIAGNOSIS — K317 Polyp of stomach and duodenum: Secondary | ICD-10-CM | POA: Insufficient documentation

## 2022-04-28 DIAGNOSIS — I110 Hypertensive heart disease with heart failure: Secondary | ICD-10-CM | POA: Diagnosis not present

## 2022-04-28 DIAGNOSIS — G4733 Obstructive sleep apnea (adult) (pediatric): Secondary | ICD-10-CM | POA: Insufficient documentation

## 2022-04-28 DIAGNOSIS — D509 Iron deficiency anemia, unspecified: Secondary | ICD-10-CM | POA: Insufficient documentation

## 2022-04-28 DIAGNOSIS — Z79899 Other long term (current) drug therapy: Secondary | ICD-10-CM | POA: Insufficient documentation

## 2022-04-28 DIAGNOSIS — D132 Benign neoplasm of duodenum: Secondary | ICD-10-CM | POA: Diagnosis not present

## 2022-04-28 DIAGNOSIS — K635 Polyp of colon: Secondary | ICD-10-CM | POA: Diagnosis not present

## 2022-04-28 DIAGNOSIS — J449 Chronic obstructive pulmonary disease, unspecified: Secondary | ICD-10-CM | POA: Insufficient documentation

## 2022-04-28 DIAGNOSIS — K319 Disease of stomach and duodenum, unspecified: Secondary | ICD-10-CM | POA: Diagnosis not present

## 2022-04-28 DIAGNOSIS — E119 Type 2 diabetes mellitus without complications: Secondary | ICD-10-CM | POA: Diagnosis not present

## 2022-04-28 DIAGNOSIS — Z87891 Personal history of nicotine dependence: Secondary | ICD-10-CM | POA: Insufficient documentation

## 2022-04-28 DIAGNOSIS — D122 Benign neoplasm of ascending colon: Secondary | ICD-10-CM | POA: Insufficient documentation

## 2022-04-28 DIAGNOSIS — I5022 Chronic systolic (congestive) heart failure: Secondary | ICD-10-CM | POA: Insufficient documentation

## 2022-04-28 DIAGNOSIS — I2510 Atherosclerotic heart disease of native coronary artery without angina pectoris: Secondary | ICD-10-CM | POA: Diagnosis not present

## 2022-04-28 DIAGNOSIS — F418 Other specified anxiety disorders: Secondary | ICD-10-CM | POA: Diagnosis not present

## 2022-04-28 DIAGNOSIS — D120 Benign neoplasm of cecum: Secondary | ICD-10-CM | POA: Insufficient documentation

## 2022-04-28 DIAGNOSIS — D123 Benign neoplasm of transverse colon: Secondary | ICD-10-CM | POA: Diagnosis not present

## 2022-04-28 DIAGNOSIS — D126 Benign neoplasm of colon, unspecified: Secondary | ICD-10-CM | POA: Diagnosis not present

## 2022-04-28 DIAGNOSIS — Z6833 Body mass index (BMI) 33.0-33.9, adult: Secondary | ICD-10-CM | POA: Insufficient documentation

## 2022-04-28 DIAGNOSIS — I509 Heart failure, unspecified: Secondary | ICD-10-CM | POA: Diagnosis not present

## 2022-04-28 DIAGNOSIS — Z8249 Family history of ischemic heart disease and other diseases of the circulatory system: Secondary | ICD-10-CM | POA: Diagnosis not present

## 2022-04-28 DIAGNOSIS — Z833 Family history of diabetes mellitus: Secondary | ICD-10-CM | POA: Diagnosis not present

## 2022-04-28 HISTORY — PX: GIVENS CAPSULE STUDY: SHX5432

## 2022-04-28 HISTORY — PX: COLONOSCOPY WITH PROPOFOL: SHX5780

## 2022-04-28 HISTORY — PX: ESOPHAGOGASTRODUODENOSCOPY (EGD) WITH PROPOFOL: SHX5813

## 2022-04-28 LAB — GLUCOSE, CAPILLARY: Glucose-Capillary: 162 mg/dL — ABNORMAL HIGH (ref 70–99)

## 2022-04-28 SURGERY — ESOPHAGOGASTRODUODENOSCOPY (EGD) WITH PROPOFOL
Anesthesia: General

## 2022-04-28 MED ORDER — PROPOFOL 1000 MG/100ML IV EMUL
INTRAVENOUS | Status: AC
  Filled 2022-04-28: qty 100

## 2022-04-28 MED ORDER — LIDOCAINE 2% (20 MG/ML) 5 ML SYRINGE
INTRAMUSCULAR | Status: DC | PRN
  Administered 2022-04-28: 20 mg via INTRAVENOUS

## 2022-04-28 MED ORDER — EPHEDRINE SULFATE (PRESSORS) 50 MG/ML IJ SOLN
INTRAMUSCULAR | Status: DC | PRN
  Administered 2022-04-28: 5 mg via INTRAVENOUS
  Administered 2022-04-28 (×2): 10 mg via INTRAVENOUS

## 2022-04-28 MED ORDER — PHENYLEPHRINE HCL (PRESSORS) 10 MG/ML IV SOLN
INTRAVENOUS | Status: DC | PRN
  Administered 2022-04-28 (×2): 160 ug via INTRAVENOUS
  Administered 2022-04-28: 80 ug via INTRAVENOUS

## 2022-04-28 MED ORDER — PROPOFOL 500 MG/50ML IV EMUL
INTRAVENOUS | Status: DC | PRN
  Administered 2022-04-28: 120 ug/kg/min via INTRAVENOUS

## 2022-04-28 MED ORDER — MIDAZOLAM HCL 2 MG/2ML IJ SOLN
INTRAMUSCULAR | Status: AC
  Filled 2022-04-28: qty 2

## 2022-04-28 MED ORDER — PROPOFOL 10 MG/ML IV BOLUS
INTRAVENOUS | Status: DC | PRN
  Administered 2022-04-28: 70 mg via INTRAVENOUS
  Administered 2022-04-28: 30 mg via INTRAVENOUS

## 2022-04-28 MED ORDER — MIDAZOLAM HCL 5 MG/5ML IJ SOLN
INTRAMUSCULAR | Status: DC | PRN
  Administered 2022-04-28: 2 mg via INTRAVENOUS

## 2022-04-28 MED ORDER — PHENYLEPHRINE 80 MCG/ML (10ML) SYRINGE FOR IV PUSH (FOR BLOOD PRESSURE SUPPORT)
PREFILLED_SYRINGE | INTRAVENOUS | Status: AC
  Filled 2022-04-28: qty 10

## 2022-04-28 MED ORDER — GLYCOPYRROLATE 0.2 MG/ML IJ SOLN
INTRAMUSCULAR | Status: DC | PRN
  Administered 2022-04-28: .2 mg via INTRAVENOUS

## 2022-04-28 MED ORDER — EPHEDRINE 5 MG/ML INJ
INTRAVENOUS | Status: AC
  Filled 2022-04-28: qty 5

## 2022-04-28 MED ORDER — LIDOCAINE HCL (PF) 2 % IJ SOLN
INTRAMUSCULAR | Status: AC
  Filled 2022-04-28: qty 5

## 2022-04-28 MED ORDER — GLYCOPYRROLATE 0.2 MG/ML IJ SOLN
INTRAMUSCULAR | Status: AC
  Filled 2022-04-28: qty 1

## 2022-04-28 MED ORDER — SODIUM CHLORIDE 0.9 % IV SOLN
INTRAVENOUS | Status: DC
  Administered 2022-04-28: 20 mL/h via INTRAVENOUS

## 2022-04-28 NOTE — Op Note (Signed)
[facility] Gastroenterology Patient Name: [name] Procedure Date: 04/28/2022 8:07 AM MRN: [id number] Account #: [id number] Date of Birth: 07-23-1955 Admit Type: Outpatient Age: 67 Room: [facility] ENDO ROOM 3 Gender: Male Note Status: Finalized Instrument Name: Colonoscope [id number] Procedure:             Colonoscopy Indications:           Last colonoscopy: January 2022, Unexplained iron                         deficiency anemia Providers:             Lin Landsman MD, MD Referring MD:          [name] (Referring MD) Medicines:             General Anesthesia Complications:         No immediate complications. Estimated blood loss: None. Procedure:             Pre-Anesthesia Assessment:                        - Prior to the procedure, a History and Physical was                         performed, and patient medications and allergies were                         reviewed. The patient is competent. The risks and                         benefits of the procedure and the sedation options and                         risks were discussed with the patient. All questions                         were answered and informed consent was obtained.                         Patient identification and proposed procedure were                         verified by the physician, the nurse, the                         anesthesiologist, the anesthetist and the technician                         in the pre-procedure area in the procedure room in the                         endoscopy suite. Mental Status Examination: alert and                         oriented. Airway Examination: normal oropharyngeal                         airway and neck mobility. Respiratory Examination:  clear to auscultation. CV Examination: normal.                         Prophylactic Antibiotics: The patient does not require                         prophylactic antibiotics. Prior  Anticoagulants: The                         patient has taken Coumadin (warfarin), last dose was 7                         days prior to procedure. ASA Grade Assessment: III - A                         patient with severe systemic disease. After reviewing                         the risks and benefits, the patient was deemed in                         satisfactory condition to undergo the procedure. The                         anesthesia plan was to use general anesthesia.                         Immediately prior to administration of medications,                         the patient was re-assessed for adequacy to receive                         sedatives. The heart rate, respiratory rate, oxygen                         saturations, blood pressure, adequacy of pulmonary                         ventilation, and response to care were monitored                         throughout the procedure. The physical status of the                         patient was re-assessed after the procedure.                        After obtaining informed consent, the colonoscope was                         passed under direct vision. Throughout the procedure,                         the patient's blood pressure, pulse, and oxygen                         saturations were monitored continuously. The  Colonoscope was introduced through the anus and                         advanced to the the cecum, identified by appendiceal                         orifice and ileocecal valve. The colonoscopy was                         performed without difficulty. The patient tolerated                         the procedure well. The quality of the bowel                         preparation was fair. The ileocecal valve, appendiceal                         orifice, and rectum were photographed. Findings:      The perianal and digital rectal examinations were normal. Pertinent       negatives include normal  sphincter tone and no palpable rectal lesions.      Ten sessile polyps were found in the transverse colon, ascending colon       and cecum. The polyps were 3 to 6 mm in size. These polyps were removed       with a cold snare. Resection and retrieval were complete. Estimated       blood loss: none.      A 12 mm polyp was found in the cecum. The polyp was sessile.       Preparations were made for mucosal resection. Demarcation of the lesion       was performed with narrow band imaging to clearly identify the       boundaries of the lesion. Eleview was injected to raise the lesion.       Snare mucosal resection was performed. Resection and retrieval were       complete. Resected tissue including tissue margins will be examined by       histology. To prevent bleeding after mucosal resection, one hemostatic       clip was successfully placed. Clip manufacturer: [employer].       There was no bleeding during, or at the end, of the procedure. Estimated       blood loss: none.      A 12 mm polyp was found in the ascending colon. The polyp was sessile.       Preparations were made for mucosal resection. Demarcation of the lesion       was performed with narrow band imaging to clearly identify the       boundaries of the lesion. Eleview was injected to raise the lesion.       Snare mucosal resection was performed. Resection and retrieval were       complete. Resected tissue including tissue margins will be examined by       histology.      A 9 mm polyp was found in the cecum. The polyp was sessile. The polyp       was removed with a hot snare. Resection and retrieval were complete.       Estimated blood loss: none.      A  9 mm polyp was found in the ascending colon. The polyp was sessile.       The polyp was removed with a hot snare. Resection and retrieval were       complete. To prevent bleeding after the polypectomy, one hemostatic clip       was successfully placed. Clip manufacturer:  [employer]. There was       no bleeding during, or at the end, of the procedure. Impression:            - Preparation of the colon was fair.                        - Ten 3 to 6 mm polyps in the transverse colon, in the                         ascending colon and in the cecum, removed with a cold                         snare. Resected and retrieved.                        - One 12 mm polyp in the cecum, removed with mucosal                         resection. Resected and retrieved. Clip was placed.                         Clip manufacturer: [employer].                        - One 12 mm polyp in the ascending colon, removed with                         mucosal resection. Resected and retrieved.                        - One 9 mm polyp in the cecum, removed with a hot                         snare. Resected and retrieved.                        - One 9 mm polyp in the ascending colon, removed with                         a hot snare. Resected and retrieved. Clip was placed.                         Clip manufacturer: [employer].                        - Mucosal resection was performed. Resection and                         retrieval were complete.                        - Mucosal resection was performed. Resection and  retrieval were complete. Recommendation:        - Discharge patient to home.                        - Cardiac diet today.                        - Continue present medications.                        - Await pathology results.                        - Resume Coumadin (warfarin) at prior dose in 3 days.                         Refer to managing physician for further adjustment of                         therapy.                        - Repeat colonoscopy in 3 years for surveillance of                         multiple polyps. Procedure Code(s):     --- Professional ---                        [phone], Colonoscopy, flexible; with  endoscopic mucosal                         resection                        [id number], 59, Colonoscopy, flexible; with removal of                         tumor(s), polyp(s), or other lesion(s) by snare                         technique Diagnosis Code(s):     --- Professional ---                        D12.3, Benign neoplasm of transverse colon (hepatic                         flexure or splenic flexure)                        D12.2, Benign neoplasm of ascending colon                        D12.0, Benign neoplasm of cecum                        D50.9, Iron deficiency anemia, unspecified CPT copyright 2022 American Medical Association. All rights reserved. The codes documented in this report are preliminary and upon coder review may  be revised to meet current compliance requirements. Dr. Ulyess Mort Lin Landsman MD, MD 04/28/2022 10:46:41 AM This report has been signed electronically. Number of Addenda: 0 Note Initiated  On: 04/28/2022 8:07 AM Scope Withdrawal Time: 0 hours 36 minutes 44 seconds  Total Procedure Duration: 0 hours 44 minutes 4 seconds  Estimated Blood Loss:  Estimated blood loss: none.      [facility]

## 2022-04-28 NOTE — H&P (Addendum)
[name] Darby, MD [address]  [place]  [place], [place] [address]  Main: [phone]  Fax: [phone] Pager: [phone]  Primary Care Physician:  [name], MD Primary Gastroenterologist:  Dr. Cephas [name]  Pre-Procedure History & Physical: HPI:  [name] is a 67 y.o. male is here for an endoscopy and colonoscopy, possible VCE.   Past Medical History:  Diagnosis Date   Abnormal drug screen 2015   MJ positive x3 - one more and we will stop prescribing ativan (08/2013).   Anemia    Angina    Anxiety    Arrhythmia    atrial fibrillation   Arthritis    CAD (coronary artery disease)    nonobstructive   Cannabis abuse    Chronic systolic CHF (congestive heart failure) ([facility]) 2013   NYHA Class II/III   COPD (chronic obstructive pulmonary disease) ([facility])    Diabetes type 2, uncontrolled    declines DSME   Dilated cardiomyopathy ([facility]) 2013   now improved   Ex-smoker    Frequent headaches    History of atrial fibrillation 2013   chronic, s/p ablation prior on coumadin   Hyperlipidemia    Hypertension    Migraine    Narcolepsy    Nonischemic cardiomyopathy ([facility]) 2013   EF 25% per Dr Nehemiah Massed   Obesity    OSA (obstructive sleep apnea)    does not use CPAP - unable to tolerate   Seasonal allergies     Past Surgical History:  Procedure Laterality Date   ATRIAL FLUTTER ABLATION N/A 09/21/2011   Procedure: ATRIAL FLUTTER ABLATION;  Surgeon: Thompson Grayer, MD;  Location: [facility] CATH LAB;  Service: Cardiovascular;  Laterality: N/A;   CARDIAC ELECTROPHYSIOLOGY MAPPING AND ABLATION  2013   for atrial flutter   CARDIOVASCULAR STRESS TEST  03/2012   ETT WNL Nehemiah Massed)   CARDIOVERSION N/A 08/12/2021   Procedure: CARDIOVERSION;  Surgeon: Corey Skains, MD;  Location: [facility] ORS;  Service: Cardiovascular;  Laterality: N/A;   COLONOSCOPY WITH PROPOFOL N/A 05/22/2020   TA, diverticulosis, descending colon ulcer biopsy WNL, int hem Allen Norris, Darren, MD)    ESOPHAGOGASTRODUODENOSCOPY (EGD) WITH PROPOFOL N/A 05/22/2020   WNL ([place])   US ECHOCARDIOGRAPHY  2014   EF 50%, nl LV fxn, RV nl size/function, mild mitral insuff    Prior to Admission medications   Medication Sig Start Date End Date Taking? Authorizing Provider  ACCU-CHEK FASTCLIX LANCETS MISC Check blood sugar once daily and as instructed. Dx 250.00 05/10/13  Yes [name], MD  albuterol (VENTOLIN HFA) 108 (90 Base) MCG/ACT inhaler Inhale 2 puffs into the lungs every 6 (six) hours as needed for wheezing or shortness of breath. 01/12/22  Yes [name], MD  amiodarone (PACERONE) 200 MG tablet Take 1 tablet (200 mg total) by mouth daily. 09/16/21  Yes Vickie Epley, MD  Budeson-Glycopyrrol-Formoterol (BREZTRI AEROSPHERE) 160-9-4.8 MCG/ACT AERO Inhale 2 puffs into the lungs 2 (two) times daily. 10/23/21  Yes [name], MD  carvedilol (COREG) 6.25 MG tablet TAKE 1 TABLET BY MOUTH TWICE A DAY WITH FOOD 01/26/22  Yes [name], MD  Cholecalciferol (VITAMIN D) 50 MCG (2000 UT) CAPS Take 1 capsule (2,000 Units total) by mouth daily. 02/05/19  Yes [name], MD  dapagliflozin propanediol (FARXIGA) 10 MG TABS tablet Take 1 tablet (10 mg total) by mouth daily before breakfast. 03/01/22  Yes Hackney, Tina A, FNP  fluticasone (FLONASE) 50 MCG/ACT nasal spray Place 2 sprays into both nostrils daily.  01/12/22  Yes [name], MD  furosemide (LASIX) 80 MG tablet Take 80 mg by mouth 2 (two) times daily. 12/25/21  Yes [provider]  glimepiride (AMARYL) 4 MG tablet Take 1 tablet (4 mg total) by mouth daily with breakfast. 01/14/22  Yes [name], MD  ipratropium-albuterol (DUONEB) 0.5-2.5 (3) MG/3ML SOLN TAKE 3 MLS BY NEBULIZATION EVERY 6 (SIX) HOURS AS NEEDED (SEVERE SHORTNESS OF BREATH/WHEEZING). 03/25/22  Yes [name], MD  metFORMIN (GLUCOPHAGE) 1000 MG tablet Take 1 tablet (1,000 mg total) by mouth 2 (two) times daily with a meal. 01/14/22   Yes [name], MD  montelukast (SINGULAIR) 10 MG tablet TAKE 1 TABLET BY MOUTH EVERYDAY AT BEDTIME 07/07/21  Yes [name], MD  pantoprazole (PROTONIX) 40 MG tablet TAKE 1 TABLET (40 MG TOTAL) BY MOUTH TWICE A DAY BEFORE MEALS 04/13/22  Yes [name], MD  warfarin (COUMADIN) 5 MG tablet TAKE 1/2 TABLET BY MOUTH DAILY EXCEPT TAKE NO COUMADIN ON SUNDAY OR AS DIRECTED BY ANTICOAGULATION CLINIC 03/26/22  Yes [name], MD  aspirin EC 81 MG tablet Take 1 tablet (81 mg total) by mouth daily. Swallow whole. Patient not taking: Reported on 04/23/2022 04/22/22   [name], MD  Blood Glucose Monitoring Suppl (BLOOD GLUCOSE MONITOR SYSTEM) W/DEVICE KIT by Does not apply route. Use to check sugar once daily and as needed Dx: E11.9 **ONE TOUCH VERIO** Patient not taking: Reported on 04/28/2022    [provider]  Docusate Calcium (STOOL SOFTENER PO) Take 1 tablet by mouth daily.    [provider]  Fe Fum-Fe Poly-Vit C-Lactobac (FUSION PO) Take by mouth. Patient states received sample.  Unsure of mg.    [provider]  FLUoxetine (PROZAC) 40 MG capsule Take 1 capsule (40 mg total) by mouth daily. 02/19/22   [name], MD  glucose blood [facility] VERIO) test strip Check blood sugar 3 times a day 10/22/21   [name], MD  metolazone (ZAROXOLYN) 2.5 MG tablet Take 1 tablet (2.5 mg total) by mouth once a week. 04/03/22   [name], MD  potassium chloride (KLOR-CON M) 10 MEQ tablet Take 1 tablet (10 mEq total) by mouth daily. And an extra one on the days you take metolazone 02/25/22   Darylene Price A, FNP  Probiotic Product (PROBIOTIC DAILY PO) Take 1 tablet by mouth daily.    [provider]  Saw Palmetto 450 MG CAPS Take 1 capsule (450 mg total) by mouth daily. 03/29/22   [name], MD  simvastatin (ZOCOR) 20 MG tablet TAKE 1 TABLET BY MOUTH EVERY DAY IN THE EVENING 12/29/21   [name], MD  Turmeric (QC  TUMERIC COMPLEX PO) Take by mouth.    [provider]  vitamin B-12 (CYANOCOBALAMIN) 1000 MCG tablet Take 1 tablet (1,000 mcg total) by mouth every Monday, Wednesday, and Friday. Patient not taking: Reported on 04/28/2022 07/22/21   [name], MD    Allergies as of 04/08/2022 - Review Complete 04/08/2022  Allergen Reaction Noted   Atorvastatin Other (See Comments) 02/06/2013   Lisinopril Cough 02/06/2013    Family History  Problem Relation Age of Onset   Heart failure Father 60   Cancer Mother 58       NHL   Hypertension Maternal Grandfather    CAD Maternal Grandfather 33       MI   Diabetes Other        grandparents   Cancer Brother 41       lung (  smoker)    Social History   Socioeconomic History   Marital status: Single    Spouse name: Not on file   Number of children: 0   Years of education: Not on file   Highest education level: Not on file  Occupational History   Occupation: disable   Tobacco Use   Smoking status: Former    Packs/day: 1.00    Years: 31.00    Total pack years: 31.00    Types: Cigarettes    Quit date: 04/26/2018    Years since quitting: 4.0   Smokeless tobacco: Never  Vaping Use   Vaping Use: Never used  Substance and Sexual Activity   Alcohol use: No    Alcohol/week: 0.0 standard drinks of alcohol   Drug use: Yes    Types: Marijuana   Sexual activity: Not Currently  Other Topics Concern   Not on file  Social History Narrative   Pt lives in [place]   Divorced   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   Disability due to neck arthritis, anxiety and sleep apnea   Edu: HS   Activity: no regular exercise, mows lawn (riding and push)   Diet: good water, fruits/vegetables daily   Social Determinants of Health   Financial Resource Strain: Medium Risk (10/23/2021)   Overall Financial Resource Strain (CARDIA)    Difficulty of Paying Living Expenses: Somewhat hard  Food Insecurity: No Food Insecurity (03/05/2022)   Hunger Vital  Sign    Worried About Running Out of Food in the Last Year: Never true    [place] in the Last Year: Never true  Transportation Needs: Unmet Transportation Needs (03/05/2022)   PRAPARE - Transportation    Lack of Transportation (Medical): Yes    Lack of Transportation (Non-Medical): Yes  Physical Activity: Inactive (09/24/2021)   Exercise Vital Sign    Days of Exercise per Week: 0 days    Minutes of Exercise per Session: 0 min  Stress: No Stress Concern Present (09/24/2021)   [place] of Stress : Not at all  Social Connections: Not on file  Intimate Partner Violence: Not At Risk (07/09/2020)   Humiliation, Afraid, Rape, and Kick questionnaire    Fear of Current or Ex-Partner: No    Emotionally Abused: No    Physically Abused: No    Sexually Abused: No    Review of Systems: See HPI, otherwise negative ROS  Physical Exam: BP 134/80   Pulse 66   Temp (!) 96.7 F (35.9 C) (Temporal)   Resp 20   Ht _0  (1.803 m)   Wt 107.5 kg   SpO2 96%   BMI 33.05 kg/m  General:   Alert,  pleasant and cooperative in NAD Head:  Normocephalic and atraumatic. Neck:  Supple; no masses or thyromegaly. Lungs:  Clear throughout to auscultation.    Heart:  Regular rate and rhythm. Abdomen:  Soft, nontender and nondistended. Normal bowel sounds, without guarding, and without rebound.   Neurologic:  Alert and  oriented x4;  grossly normal neurologically.  Impression/Plan: [name] Abdallah is here for an endoscopy and colonoscopy and possible VCE to be performed for chronic iron deficiency anemia  Risks, benefits, limitations, and alternatives regarding  endoscopy and colonoscopy have been reviewed with the patient.  Questions have been answered.  All parties agreeable.   Sherri Sear, MD  04/28/2022, 8:51 AM

## 2022-04-28 NOTE — Anesthesia Preprocedure Evaluation (Signed)
Anesthesia Evaluation  Patient identified by MRN, date of birth, ID band Patient awake    Reviewed: Allergy & Precautions, NPO status , Patient's Chart, lab work & pertinent test results  Airway Mallampati: II  TM Distance: >3 FB Neck ROM: full    Dental  (+) Edentulous Upper, Edentulous Lower   Pulmonary neg pulmonary ROS, sleep apnea , COPD, former smoker   Pulmonary exam normal  + decreased breath sounds      Cardiovascular Exercise Tolerance: Good hypertension, Pt. on medications + CAD, +CHF and + DOE  negative cardio ROS Normal cardiovascular exam Rhythm:Regular Rate:Normal     Neuro/Psych   Anxiety Depression    negative neurological ROS  negative psych ROS   GI/Hepatic negative GI ROS, Neg liver ROS, PUD,,,  Endo/Other  negative endocrine ROSdiabetes, Well Controlled, Type 2, Oral Hypoglycemic Agents  Morbid obesity  Renal/GU negative Renal ROS  negative genitourinary   Musculoskeletal   Abdominal  (+) + obese  Peds negative pediatric ROS (+)  Hematology negative hematology ROS (+)   Anesthesia Other Findings Past Medical History: 2015: Abnormal drug screen     Comment:  MJ positive x3 - one more and we will stop prescribing               ativan (08/2013). No date: Anemia No date: Angina No date: Anxiety No date: Arrhythmia     Comment:  atrial fibrillation No date: Arthritis No date: CAD (coronary artery disease)     Comment:  nonobstructive No date: Cannabis abuse 5366: Chronic systolic CHF (congestive heart failure) ([facility])     Comment:  NYHA Class II/III No date: COPD (chronic obstructive pulmonary disease) ([place]) No date: Diabetes type 2, uncontrolled     Comment:  declines DSME 2013: Dilated cardiomyopathy ([place])     Comment:  now improved No date: Ex-smoker No date: Frequent headaches 2013: History of atrial fibrillation     Comment:  chronic, s/p ablation prior on coumadin No date:  Hyperlipidemia No date: Hypertension No date: Migraine No date: Narcolepsy 2013: Nonischemic cardiomyopathy ([place] [place])     Comment:  EF 25% per Dr Nehemiah Massed No date: Obesity No date: OSA (obstructive sleep apnea)     Comment:  does not use CPAP - unable to tolerate No date: Seasonal allergies  Past Surgical History: 09/21/2011: ATRIAL FLUTTER ABLATION; N/A     Comment:  Procedure: ATRIAL FLUTTER ABLATION;  Surgeon: Thompson Grayer, MD;  Location: [facility] CATH LAB;  Service:               Cardiovascular;  Laterality: N/A; 2013: CARDIAC ELECTROPHYSIOLOGY MAPPING AND ABLATION     Comment:  for atrial flutter 03/2012: CARDIOVASCULAR STRESS TEST     Comment:  ETT WNL Nehemiah Massed) 08/12/2021: CARDIOVERSION; N/A     Comment:  Procedure: CARDIOVERSION;  Surgeon: Corey Skains,               MD;  Location: [facility] ORS;  Service: Cardiovascular;                Laterality: N/A; 05/22/2020: COLONOSCOPY WITH PROPOFOL; N/A     Comment:  TA, diverticulosis, descending colon ulcer biopsy WNL,               int hem Allen Norris, Darren, MD) 05/22/2020: ESOPHAGOGASTRODUODENOSCOPY (EGD) WITH PROPOFOL; N/A     Comment:  WNL Allen Norris) 2014: US ECHOCARDIOGRAPHY     Comment:  EF 50%, nl LV fxn, RV nl size/function, mild mitral               insuff  BMI    Body Mass Index: 33.05 kg/m      Reproductive/Obstetrics negative OB ROS                             Anesthesia Physical Anesthesia Plan  ASA: 3  Anesthesia Plan: General   Post-op Pain Management:    Induction: Intravenous  PONV Risk Score and Plan: Propofol infusion and TIVA  Airway Management Planned: Natural Airway  Additional Equipment:   Intra-op Plan:   Post-operative Plan:   Informed Consent: I have reviewed the patients History and Physical, chart, labs and discussed the procedure including the risks, benefits and alternatives for the proposed anesthesia with the patient or authorized representative who has  indicated his/her understanding and acceptance.     Dental Advisory Given  Plan Discussed with: CRNA and Surgeon  Anesthesia Plan Comments:        Anesthesia Quick Evaluation

## 2022-04-28 NOTE — Op Note (Signed)
[facility] Gastroenterology Patient Name: [name] Procedure Date: 04/28/2022 8:07 AM MRN: [id number] Account #: [id number] Date of Birth: May 25, 1955 Admit Type: Outpatient Age: 67 Room: [facility] ENDO ROOM 3 Gender: Male Note Status: Finalized Instrument Name: Upper Endoscope [id number] Procedure:             Upper GI endoscopy Indications:           Unexplained iron deficiency anemia Providers:             Lin Landsman MD, MD Referring MD:          [name] (Referring MD) Medicines:             General Anesthesia Complications:         No immediate complications. Estimated blood loss: None. Procedure:             Pre-Anesthesia Assessment:                        - Prior to the procedure, a History and Physical was                         performed, and patient medications and allergies were                         reviewed. The patient is competent. The risks and                         benefits of the procedure and the sedation options and                         risks were discussed with the patient. All questions                         were answered and informed consent was obtained.                         Patient identification and proposed procedure were                         verified by the physician, the nurse, the                         anesthesiologist, the anesthetist and the technician                         in the pre-procedure area in the procedure room in the                         endoscopy suite. Mental Status Examination: alert and                         oriented. Airway Examination: normal oropharyngeal                         airway and neck mobility. Respiratory Examination:                         clear to auscultation. CV Examination: normal.  Prophylactic Antibiotics: The patient does not require                         prophylactic antibiotics. Prior Anticoagulants: The                          patient has taken Coumadin (warfarin), last dose was 7                         days prior to procedure. ASA Grade Assessment: III - A                         patient with severe systemic disease. After reviewing                         the risks and benefits, the patient was deemed in                         satisfactory condition to undergo the procedure. The                         anesthesia plan was to use general anesthesia.                         Immediately prior to administration of medications,                         the patient was re-assessed for adequacy to receive                         sedatives. The heart rate, respiratory rate, oxygen                         saturations, blood pressure, adequacy of pulmonary                         ventilation, and response to care were monitored                         throughout the procedure. The physical status of the                         patient was re-assessed after the procedure.                        After obtaining informed consent, the endoscope was                         passed under direct vision. Throughout the procedure,                         the patient's blood pressure, pulse, and oxygen                         saturations were monitored continuously. The Endoscope                         was introduced through the mouth, and advanced to  the                         third part of duodenum. The upper GI endoscopy was                         accomplished without difficulty. The patient tolerated                         the procedure well. Findings:      A single 12 mm sessile polyp with no bleeding was found in the second       portion of the duodenum. Preparations were made for mucosal resection.       Demarcation of the lesion was performed with narrow band imaging to       clearly identify the boundaries of the lesion. Saline was injected to       raise the lesion. Snare mucosal resection was performed. Resection  and       retrieval were complete. Resected tissue including tissue margins will       be examined by histology.      The duodenal bulb, second portion of the duodenum and third portion of       the duodenum were normal. Biopsies were taken with a cold forceps for       histology.      The entire examined stomach was normal. Biopsies were taken with a cold       forceps for Helicobacter pylori testing.      The cardia and gastric fundus were normal on retroflexion.      Esophagogastric landmarks were identified: the gastroesophageal junction       was found at 45 cm from the incisors.      The gastroesophageal junction and examined esophagus were normal. Impression:            - A single duodenal polyp. Resected and retrieved.                        - Normal duodenal bulb, second portion of the duodenum                         and third portion of the duodenum. Biopsied.                        - Normal stomach. Biopsied.                        - Esophagogastric landmarks identified.                        - Normal gastroesophageal junction and esophagus.                        - Mucosal resection was performed. Resection and                         retrieval were complete. Recommendation:        - Await pathology results.                        - Proceed with colonoscopy as scheduled  See colonoscopy report Procedure Code(s):     --- Professional ---                        [phone], Esophagogastroduodenoscopy, flexible,                         transoral; with endoscopic mucosal resection                        43239, 16, Esophagogastroduodenoscopy, flexible,                         transoral; with biopsy, single or multiple Diagnosis Code(s):     --- Professional ---                        K31.7, Polyp of stomach and duodenum                        D50.9, Iron deficiency anemia, unspecified CPT copyright 2022 American Medical Association. All rights reserved. The  codes documented in this report are preliminary and upon coder review may  be revised to meet current compliance requirements. Dr. Ulyess Mort Lin Landsman MD, MD 04/28/2022 9:31:23 AM This report has been signed electronically. Number of Addenda: 0 Note Initiated On: 04/28/2022 8:07 AM Estimated Blood Loss:  Estimated blood loss: none.      [facility]

## 2022-04-28 NOTE — Transfer of Care (Signed)
Immediate Anesthesia Transfer of Care Note  Patient: [name]  Procedure(s) Performed: ESOPHAGOGASTRODUODENOSCOPY (EGD) WITH PROPOFOL COLONOSCOPY WITH PROPOFOL GIVENS CAPSULE STUDY  Patient Location: PACU  Anesthesia Type:General  Level of Consciousness: drowsy  Airway & Oxygen Therapy: Patient Spontanous Breathing and Patient connected to nasal cannula oxygen  Post-op Assessment: Report given to RN and Post -op Vital signs reviewed and stable  Post vital signs: Reviewed  Last Vitals:  Vitals Value Taken Time  BP 125/80 04/28/22 1022  Temp 36.2 C 04/28/22 1022  Pulse 76 04/28/22 1024  Resp 15 04/28/22 1024  SpO2 95 % 04/28/22 1024  Vitals shown include unvalidated device data.  Last Pain:  Vitals:   04/28/22 1022  TempSrc: Temporal  PainSc: Asleep         Complications: No notable events documented.

## 2022-04-29 ENCOUNTER — Telehealth: Payer: Self-pay | Admitting: Family Medicine

## 2022-04-29 ENCOUNTER — Encounter: Payer: Self-pay | Admitting: Gastroenterology

## 2022-04-29 DIAGNOSIS — K219 Gastro-esophageal reflux disease without esophagitis: Secondary | ICD-10-CM | POA: Diagnosis not present

## 2022-04-29 DIAGNOSIS — G43909 Migraine, unspecified, not intractable, without status migrainosus: Secondary | ICD-10-CM | POA: Diagnosis not present

## 2022-04-29 DIAGNOSIS — Z7901 Long term (current) use of anticoagulants: Secondary | ICD-10-CM | POA: Diagnosis not present

## 2022-04-29 DIAGNOSIS — Z7984 Long term (current) use of oral hypoglycemic drugs: Secondary | ICD-10-CM | POA: Diagnosis not present

## 2022-04-29 DIAGNOSIS — I420 Dilated cardiomyopathy: Secondary | ICD-10-CM | POA: Diagnosis not present

## 2022-04-29 DIAGNOSIS — E785 Hyperlipidemia, unspecified: Secondary | ICD-10-CM | POA: Diagnosis not present

## 2022-04-29 DIAGNOSIS — Z5181 Encounter for therapeutic drug level monitoring: Secondary | ICD-10-CM | POA: Diagnosis not present

## 2022-04-29 DIAGNOSIS — I872 Venous insufficiency (chronic) (peripheral): Secondary | ICD-10-CM | POA: Diagnosis not present

## 2022-04-29 DIAGNOSIS — I252 Old myocardial infarction: Secondary | ICD-10-CM | POA: Diagnosis not present

## 2022-04-29 DIAGNOSIS — E1165 Type 2 diabetes mellitus with hyperglycemia: Secondary | ICD-10-CM | POA: Diagnosis not present

## 2022-04-29 DIAGNOSIS — E876 Hypokalemia: Secondary | ICD-10-CM | POA: Diagnosis not present

## 2022-04-29 DIAGNOSIS — J441 Chronic obstructive pulmonary disease with (acute) exacerbation: Secondary | ICD-10-CM | POA: Diagnosis not present

## 2022-04-29 DIAGNOSIS — I4891 Unspecified atrial fibrillation: Secondary | ICD-10-CM | POA: Diagnosis not present

## 2022-04-29 DIAGNOSIS — Z87891 Personal history of nicotine dependence: Secondary | ICD-10-CM | POA: Diagnosis not present

## 2022-04-29 DIAGNOSIS — Z9981 Dependence on supplemental oxygen: Secondary | ICD-10-CM | POA: Diagnosis not present

## 2022-04-29 DIAGNOSIS — I5042 Chronic combined systolic (congestive) and diastolic (congestive) heart failure: Secondary | ICD-10-CM | POA: Diagnosis not present

## 2022-04-29 DIAGNOSIS — I25119 Atherosclerotic heart disease of native coronary artery with unspecified angina pectoris: Secondary | ICD-10-CM | POA: Diagnosis not present

## 2022-04-29 DIAGNOSIS — G4733 Obstructive sleep apnea (adult) (pediatric): Secondary | ICD-10-CM | POA: Diagnosis not present

## 2022-04-29 DIAGNOSIS — M1990 Unspecified osteoarthritis, unspecified site: Secondary | ICD-10-CM | POA: Diagnosis not present

## 2022-04-29 DIAGNOSIS — Z9181 History of falling: Secondary | ICD-10-CM | POA: Diagnosis not present

## 2022-04-29 DIAGNOSIS — F32A Depression, unspecified: Secondary | ICD-10-CM | POA: Diagnosis not present

## 2022-04-29 DIAGNOSIS — I110 Hypertensive heart disease with heart failure: Secondary | ICD-10-CM | POA: Diagnosis not present

## 2022-04-29 LAB — SURGICAL PATHOLOGY

## 2022-04-29 NOTE — Anesthesia Postprocedure Evaluation (Signed)
Anesthesia Post Note  Patient: [name]  Procedure(s) Performed: ESOPHAGOGASTRODUODENOSCOPY (EGD) WITH PROPOFOL COLONOSCOPY WITH PROPOFOL GIVENS CAPSULE STUDY  Patient location during evaluation: PACU Anesthesia Type: General Level of consciousness: awake Pain management: pain level controlled Vital Signs Assessment: post-procedure vital signs reviewed and stable Respiratory status: spontaneous breathing Cardiovascular status: stable Anesthetic complications: no  No notable events documented.   Last Vitals:  Vitals:   04/28/22 1038 04/28/22 1042  BP:  134/87  Pulse: 73 79  Resp: 19 (!) 22  Temp:    SpO2: 96% 91%    Last Pain:  Vitals:   04/28/22 1042  TempSrc:   PainSc: 0-No pain                 VAN STAVEREN,Gissele Narducci

## 2022-04-29 NOTE — Telephone Encounter (Signed)
Pt completed colonoscopy/endoscopy today. He's been off coumadin for the past 3 weeks due to symptomatic anemia.  Per GI ok to restart warfarin 3 days after procedure.  Please call pt to get plugged back into coumadin clinic management.

## 2022-04-29 NOTE — Telephone Encounter (Addendum)
Received VM from [name], nurse with [facility], at pt's home. She reported pt had colonoscopy yesterday and was told to restart warfarin 3 days after procedure. Number for [name] is [phone].  Per PCP, pt is ok to restart warfarin 3 days after procedure. Pt would restart warfarin on 05/01/22, Saturday.   Contacted [name] and advised for pt to restart warfarin on Saturday, 1/6 and to check INR on 1/12. Advised if any changes to contact the office or the coumadin clinic. [name] verbalized understanding.

## 2022-04-30 ENCOUNTER — Encounter: Payer: Self-pay | Admitting: Gastroenterology

## 2022-04-30 DIAGNOSIS — H2513 Age-related nuclear cataract, bilateral: Secondary | ICD-10-CM | POA: Diagnosis not present

## 2022-04-30 DIAGNOSIS — H40003 Preglaucoma, unspecified, bilateral: Secondary | ICD-10-CM | POA: Diagnosis not present

## 2022-04-30 DIAGNOSIS — E113293 Type 2 diabetes mellitus with mild nonproliferative diabetic retinopathy without macular edema, bilateral: Secondary | ICD-10-CM | POA: Diagnosis not present

## 2022-04-30 NOTE — Telephone Encounter (Signed)
Pt called in stated that his pharmacy need approval to refill his medication . Pt did not know the name of RX wants PCP to call the pharmacy . Please advise [phone]

## 2022-05-03 ENCOUNTER — Encounter: Payer: Self-pay | Admitting: Family Medicine

## 2022-05-03 ENCOUNTER — Ambulatory Visit (INDEPENDENT_AMBULATORY_CARE_PROVIDER_SITE_OTHER): Payer: Medicare Other | Admitting: Family Medicine

## 2022-05-03 ENCOUNTER — Telehealth: Payer: Self-pay

## 2022-05-03 VITALS — BP 136/64 | HR 62 | Temp 97.8°F | Ht 70.75 in | Wt 239.5 lb

## 2022-05-03 DIAGNOSIS — Z7901 Long term (current) use of anticoagulants: Secondary | ICD-10-CM

## 2022-05-03 DIAGNOSIS — D508 Other iron deficiency anemias: Secondary | ICD-10-CM | POA: Diagnosis not present

## 2022-05-03 DIAGNOSIS — Z86010 Personal history of colonic polyps: Secondary | ICD-10-CM

## 2022-05-03 DIAGNOSIS — Z23 Encounter for immunization: Secondary | ICD-10-CM

## 2022-05-03 DIAGNOSIS — I4819 Other persistent atrial fibrillation: Secondary | ICD-10-CM

## 2022-05-03 DIAGNOSIS — D649 Anemia, unspecified: Secondary | ICD-10-CM

## 2022-05-03 DIAGNOSIS — E785 Hyperlipidemia, unspecified: Secondary | ICD-10-CM | POA: Diagnosis not present

## 2022-05-03 DIAGNOSIS — Z7189 Other specified counseling: Secondary | ICD-10-CM | POA: Diagnosis not present

## 2022-05-03 DIAGNOSIS — D509 Iron deficiency anemia, unspecified: Secondary | ICD-10-CM

## 2022-05-03 DIAGNOSIS — G4733 Obstructive sleep apnea (adult) (pediatric): Secondary | ICD-10-CM | POA: Diagnosis not present

## 2022-05-03 DIAGNOSIS — E669 Obesity, unspecified: Secondary | ICD-10-CM

## 2022-05-03 DIAGNOSIS — I7143 Infrarenal abdominal aortic aneurysm, without rupture: Secondary | ICD-10-CM

## 2022-05-03 DIAGNOSIS — Z125 Encounter for screening for malignant neoplasm of prostate: Secondary | ICD-10-CM

## 2022-05-03 DIAGNOSIS — E1169 Type 2 diabetes mellitus with other specified complication: Secondary | ICD-10-CM

## 2022-05-03 DIAGNOSIS — E113299 Type 2 diabetes mellitus with mild nonproliferative diabetic retinopathy without macular edema, unspecified eye: Secondary | ICD-10-CM | POA: Diagnosis not present

## 2022-05-03 DIAGNOSIS — Z0001 Encounter for general adult medical examination with abnormal findings: Secondary | ICD-10-CM

## 2022-05-03 NOTE — Telephone Encounter (Signed)
Please inform patient that he had a fair prep when he underwent colonoscopy in 2022.  Therefore, the polyps could have been missed when his colon is not fully clean.  I still recommend him to undergo video capsule endoscopy  Also, he should have hemoglobin checked given that Coumadin has been restarted  RV

## 2022-05-03 NOTE — Assessment & Plan Note (Signed)
Advanced directives - has discussed with family. No children. Unsure who would be HCPOA. (?sister who lives in [place]). Does not want prolonged life support. Packet provided today.

## 2022-05-03 NOTE — Telephone Encounter (Signed)
-----   Message from Lin Landsman, MD sent at 04/30/2022 11:42 AM EST ----- Pathology results from upper endoscopy came back normal.  Several polyps removed during colonoscopy came back as precancerous polyps.  He needs surveillance colonoscopy in 1 year.  Also, recommend referral to genetic counselor because there are more than 10 precancerous polyps  For his iron deficiency anemia, recommend video capsule endoscopy if patient is agreeable  Rohini Vanga

## 2022-05-03 NOTE — Telephone Encounter (Signed)
Patient states he just had colonoscopy and EGD 1 year ago with Dr. Allen Norris which was 05/22/2020. He states at that time only 1 polyp was removed he thinks. He wants to know in this time how could so many polyps be form in this peroid of time he states. He states he also had a video capsule done with Dr. Allen Norris also. Looked and do not see the procedure report but sees the letter that was sent to him. Called ENDO and talk to penny she states she is going to look and try to find the report. He states he does not mind having test done but he does not want repeat test because each test cost him 300 to 400 dollars

## 2022-05-03 NOTE — Progress Notes (Unsigned)
Patient ID: [name], male    DOB: October 11, 1955, 67 y.o.   MRN: [id number]  This visit was conducted in person.  BP 136/64   Pulse 62   Temp 97.8 F (36.6 C) (Temporal)   Ht 5' 10.75" (1.797 m)   Wt 239 lb 8 oz (108.6 kg)   SpO2 93%   BMI 33.64 kg/m    CC: CPE Subjective:   HPI: [name] is a 67 y.o. male presenting on 05/03/2022 for Medicare Wellness   Saw health advisor 09/2021 for medicare wellness visit. Note reviewed.   Hearing Screening   '500Hz'$  '1000Hz'$  '2000Hz'$  '4000Hz'$   Right ear 20 40 0 0  Left ear 20 25 40 0  Vision Screening - Comments:: Last eye exam, 04/2022.  [place] Office Visit from 05/03/2022 in [place] at [facility]  PHQ-2 Total Score 0          05/03/2022    3:22 PM 01/15/2022   12:07 PM 09/24/2021    2:08 PM 07/20/2021    3:29 PM 07/09/2020    2:28 PM  [place] in the past year? '1 1 1 1 '$ 0  Comment   loses balance    Number falls in past yr: '1 1 1 1 '$ 0  Injury with Fall? 1 0 0 0 0  Comment Facial injuries      Risk for fall due to :  History of fall(s);Impaired balance/gait;Impaired mobility Impaired balance/gait;Impaired mobility;Medication side effect  Medication side effect  Follow up  Falls evaluation completed;Education provided;Falls prevention discussed Falls evaluation completed;Education provided;Falls prevention discussed  Falls evaluation completed;Falls prevention discussed   Recent colonoscopy/endoscopy as per below for symptomatic anemia:  COLONOSCOPY WITH PROPOFOL 04/28/2022 - multiple TAs - 10 small, 2 1.2cm, 2 120m, rpt 3 yrs (Vanga, RTally Due MD) - consider genetics eval   ESOPHAGOGASTRODUODENOSCOPY (EGD) WITH PROPOFOL 04/28/2022 - 136mduodenal TA, reactive gastropathy, enteric mucosa in stomach biopsy of unclear significance (Vanga, RoTally DueMD)  Recommend proceed with capsule endoscopy.   Pt [facility], previously requested  report. Finally received report today and I will ask to STAT scan into chart - poor visualization of stomach due to food content, small bowel was not visualized, rec esophagram and upper GI study to evaluate for stricture/dysmotility.   After reviewing this with patient, he states he would agree to rpt capsule endoscopy through [facility].   Has since restarted coumadin on Saturday 05/01/2021. Planned rpt INR 05/07/2022.  Cbg 135 this morning at home.   Preventative: COLONOSCOPY WITH PROPOFOL 05/22/2020 - fair prep, TA, diverticulosis, descending colon ulcer biopsy WNL, int hem (WAllen NorrisDarren, MD)  COLONOSCOPY WITH PROPOFOL 04/28/2022 - multiple TAs - 10 small, 2 1.2cm, 2 20m84mrpt 3 yrs (Vanga, RohTally DueD) Prostate cancer screening - yearly PSA Lung cancer screening - quit 2014. 20+ PY hx. Undergoing lung cancer screening CT, started 03/2019.  Flu shot - yearly COVID vaccine 06/2019, 07/2019, booster 02/2020 Td 2008, Tdap 10/2021 Pneumovax 2014. Prevnar-20 today Shingrix - discussed, to check with pharmacy RSV - to discuss with pharmacy Advanced directives - has discussed with family. No children. Unsure who would be HCPOA. (?sister who lives in [place] life [place]. Packet provided today.  Seat belt use discussed Sunscreen use discussed. No changing moles on skin.  Ex smoker 2014 - occasional cigarette.  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 Interim medical history since our last visit reviewed. Allergies and medications reviewed and updated. Outpatient Medications Prior to Visit   Medication Sig Dispense Refill   ACCU-CHEK FASTCLIX LANCETS MISC Check blood sugar once daily and as instructed. Dx 250.00 100 each 3   albuterol (VENTOLIN HFA) 108 (90 Base) MCG/ACT inhaler Inhale 2 puffs into the lungs every 6 (six) hours as needed for wheezing or shortness of breath. 8.5 each 6   amiodarone (PACERONE) 200 MG tablet Take 1 tablet (200 mg total) by mouth daily. 90 tablet 3   aspirin EC 81 MG tablet Take 1 tablet (81 mg total) by mouth daily. Swallow whole. 30 tablet 12   Blood Glucose Monitoring Suppl (BLOOD GLUCOSE MONITOR SYSTEM) W/DEVICE KIT by Does not apply route. Use to check sugar once daily and as needed Dx: E11.9 **ONE TOUCH VERIO**     Budeson-Glycopyrrol-Formoterol (BREZTRI AEROSPHERE) 160-9-4.8 MCG/ACT AERO Inhale 2 puffs into the lungs 2 (two) times daily. 32.1 g 3   carvedilol (COREG) 6.25 MG tablet TAKE 1 TABLET BY MOUTH TWICE A DAY WITH FOOD 180 tablet 0   Cholecalciferol (VITAMIN D) 50 MCG (2000 UT) CAPS Take 1 capsule (2,000 Units total) by mouth daily. 30 capsule    dapagliflozin propanediol (FARXIGA) 10 MG TABS tablet Take 1 tablet (10 mg total) by mouth daily before breakfast. 90 tablet 3   Docusate Calcium (STOOL SOFTENER PO) Take 1 tablet by mouth daily.     Fe Fum-Fe Poly-Vit C-Lactobac (FUSION PO) Take by mouth. Patient states received sample.  Unsure of mg.     FLUoxetine (PROZAC) 40 MG capsule Take 1 capsule (40 mg total) by mouth daily. 30 capsule 6   fluticasone (FLONASE) 50 MCG/ACT nasal spray Place 2 sprays into both nostrils daily. 48 mL 3   furosemide (LASIX) 80 MG tablet Take 80 mg by mouth 2 (two) times daily.     glimepiride (AMARYL) 4 MG tablet Take 1 tablet (4 mg total) by mouth daily with breakfast. 90 tablet 1   glucose blood (ONETOUCH VERIO) test strip Check blood sugar 3 times a day 300 strip 3   ipratropium-albuterol (DUONEB) 0.5-2.5 (3) MG/3ML SOLN TAKE 3 MLS BY NEBULIZATION EVERY 6 (SIX) HOURS AS NEEDED (SEVERE SHORTNESS OF  BREATH/WHEEZING). 360 mL 0   metFORMIN (GLUCOPHAGE) 1000 MG tablet Take 1 tablet (1,000 mg total) by mouth 2 (two) times daily with a meal. 180 tablet 1   metolazone (ZAROXOLYN) 2.5 MG tablet Take 1 tablet (2.5 mg total) by mouth once a week.     montelukast (SINGULAIR) 10 MG tablet TAKE 1 TABLET BY MOUTH EVERYDAY AT BEDTIME 90 tablet 3   pantoprazole (PROTONIX) 40 MG tablet TAKE 1 TABLET (40 MG TOTAL) BY MOUTH TWICE A DAY BEFORE MEALS 180 tablet 0   potassium chloride (KLOR-CON M) 10 MEQ tablet Take 1 tablet (10 mEq total) by mouth daily. And an extra one on the days you take metolazone 38 tablet 5   Probiotic Product (PROBIOTIC DAILY PO) Take 1 tablet by mouth daily.     Saw Palmetto 450 MG CAPS Take 1 capsule (450 mg total)  by mouth daily.  0   simvastatin (ZOCOR) 20 MG tablet TAKE 1 TABLET BY MOUTH EVERY DAY IN THE EVENING 90 tablet 0   Turmeric (QC TUMERIC COMPLEX PO) Take by mouth.     vitamin B-12 (CYANOCOBALAMIN) 1000 MCG tablet Take 1 tablet (1,000 mcg total) by mouth every Monday, Wednesday, and Friday.     warfarin (COUMADIN) 5 MG tablet TAKE 1/2 TABLET BY MOUTH DAILY EXCEPT TAKE NO COUMADIN ON SUNDAY OR AS DIRECTED BY ANTICOAGULATION CLINIC 60 tablet 1   No facility-administered medications prior to visit.     Per HPI unless specifically indicated in ROS section below Review of Systems  Constitutional:  Negative for activity change, appetite change, chills, fatigue, fever and unexpected weight change.  HENT:  Negative for hearing loss.   Eyes:  Negative for visual disturbance.  Respiratory:  Positive for cough, chest tightness and shortness of breath. Negative for wheezing.   Cardiovascular:  Negative for chest pain, palpitations and leg swelling.  Gastrointestinal:  Positive for nausea. Negative for abdominal distention, abdominal pain, blood in stool, constipation, diarrhea and vomiting.  Genitourinary:  Negative for difficulty urinating and hematuria.  Musculoskeletal:   Negative for arthralgias, myalgias and neck pain.  Skin:  Negative for rash.  Neurological:  Positive for dizziness. Negative for seizures, syncope and headaches.  Hematological:  Negative for adenopathy. Bruises/bleeds easily.  Psychiatric/Behavioral:  Negative for dysphoric mood. The patient is nervous/anxious.     Objective:  BP 136/64   Pulse 62   Temp 97.8 F (36.6 C) (Temporal)   Ht 5' 10.75" (1.797 m)   Wt 239 lb 8 oz (108.6 kg)   SpO2 93%   BMI 33.64 kg/m   Wt Readings from Last 3 Encounters:  05/03/22 239 lb 8 oz (108.6 kg)  04/28/22 237 lb (107.5 kg)  04/23/22 232 lb (105.2 kg)      Physical Exam Vitals and nursing note reviewed.  Constitutional:      General: He is not in acute distress.    Appearance: Normal appearance. He is well-developed. He is not ill-appearing.  HENT:     Head: Normocephalic and atraumatic.     Right Ear: Hearing, tympanic membrane, ear canal and external ear normal.     Left Ear: Hearing, tympanic membrane, ear canal and external ear normal.     Mouth/Throat:     Comments: Wearing mask Eyes:     General: No scleral icterus.    Extraocular Movements: Extraocular movements intact.     Conjunctiva/sclera: Conjunctivae normal.     Pupils: Pupils are equal, round, and reactive to light.  Neck:     Thyroid: No thyroid mass or thyromegaly.  Cardiovascular:     Rate and Rhythm: Normal rate and regular rhythm.     Pulses: Normal pulses.          Radial pulses are 2+ on the right side and 2+ on the left side.     Heart sounds: Normal heart sounds. No murmur heard. Pulmonary:     Effort: Pulmonary effort is normal. No respiratory distress.     Breath sounds: Normal breath sounds. No wheezing, rhonchi or rales.  Abdominal:     General: Bowel sounds are normal. There is no distension.     Palpations: Abdomen is soft. There is no mass.     Tenderness: There is no abdominal tenderness. There is no guarding or rebound.     Hernia: No hernia is  present.  Musculoskeletal:  General: Normal range of motion.     Cervical back: Normal range of motion and neck supple.     Right lower leg: No edema.     Left lower leg: No edema.  Lymphadenopathy:     Cervical: No cervical adenopathy.  Skin:    General: Skin is warm and dry.     Findings: No rash.  Neurological:     General: No focal deficit present.     Mental Status: He is alert and oriented to person, place, and time.  Psychiatric:        Mood and Affect: Mood normal.        Behavior: Behavior normal.        Thought Content: Thought content normal.        Judgment: Judgment normal.       Results for orders placed or performed in visit on 05/03/22  CBC with Differential/Platelet  Result Value Ref Range   WBC 11.5 (H) 4.0 - 10.5 K/uL   RBC 4.09 (L) 4.22 - 5.81 Mil/uL   Hemoglobin 9.6 (L) 13.0 - 17.0 g/dL   HCT 31.2 (L) 39.0 - 52.0 %   MCV 76.4 (L) 78.0 - 100.0 fl   MCHC 30.8 30.0 - 36.0 g/dL   RDW 29.6 (H) 11.5 - 15.5 %   Platelets 213.0 150.0 - 400.0 K/uL   Neutrophils Relative % 71.5 43.0 - 77.0 %   Lymphocytes Relative 15.7 12.0 - 46.0 %   Monocytes Relative 9.3 3.0 - 12.0 %   Eosinophils Relative 2.6 0.0 - 5.0 %   Basophils Relative 0.9 0.0 - 3.0 %   Neutro Abs 8.2 (H) 1.4 - 7.7 K/uL   Lymphs Abs 1.8 0.7 - 4.0 K/uL   Monocytes Absolute 1.1 (H) 0.1 - 1.0 K/uL   Eosinophils Absolute 0.3 0.0 - 0.7 K/uL   Basophils Absolute 0.1 0.0 - 0.1 K/uL  Basic metabolic panel  Result Value Ref Range   Sodium 143 135 - 145 mEq/L   Potassium 4.0 3.5 - 5.1 mEq/L   Chloride 94 (L) 96 - 112 mEq/L   CO2 37 (H) 19 - 32 mEq/L   Glucose, Bld 153 (H) 70 - 99 mg/dL   BUN 12 6 - 23 mg/dL   Creatinine, Ser 1.15 0.40 - 1.50 mg/dL   GFR 66.26 >60.00 mL/min   Calcium 9.2 8.4 - 10.5 mg/dL  PSA  Result Value Ref Range   PSA 0.91 0.10 - 4.00 ng/mL   Lab Results  Component Value Date   CHOL 104 01/04/2022   HDL 42 01/04/2022   LDLCALC 50 01/04/2022   LDLDIRECT 97.0 07/09/2020    TRIG 59 01/04/2022   CHOLHDL 2.5 01/04/2022    Assessment & Plan:   Problem List Items Addressed This Visit     Advanced care planning/counseling discussion (Chronic)    Advanced directives - has discussed with family. No children. Unsure who would be HCPOA. (?sister who lives in [place]). Does not want prolonged life support. Packet provided today.       Encounter for general adult medical examination with abnormal findings - Primary (Chronic)    Preventative protocols reviewed and updated unless pt declined. Discussed healthy diet and lifestyle.  [id number] today Discussed RSV and Shingrix vaccines through pharmacy      Obstructive sleep apnea    Still needs pulm eval (sleep study, PFTs).       Obesity, Class I, BMI 30.0-34.9 (see actual BMI)   Hyperlipidemia associated with type 2  diabetes mellitus (HCC)    Stable period on simvastatin '20mg'$  - continue. The ASCVD Risk score (Arnett DK, et al., 2019) failed to calculate for the following reasons:   The patient has a prior MI or stroke diagnosis       Type 2 diabetes mellitus with other specified complication ([place])    Diabetes well controlled based on recent A1c - continue glimepiride and farxiga.      AAA (abdominal aortic aneurysm) without rupture (HCC)    Latest imaging study showing stable 3.7cm dilation 08/2021 - rec rpt imaging 2-3 yrs.       Iron deficiency anemia    S/p 2u RBC transfusion 01/2022 and 03/2022 as well as iron infusion 03/2022.  He continues taking fusion plus pills.  Multiple polyps on recent colonoscopy as well as large 1.2cm duodenal polyp on endoscopy.  Next step is capsule endoscopy - after reviewing inconclusive capsule endoscopy report from 09/2021 with patient, he agrees to repeat through [facility] - will notify Dr Verlin Grills office.       Relevant Orders   CBC with Differential/Platelet   Ferritin   IBC panel   Atrial fibrillation (HCC)    Coumadin restarted 05/01/2022 with planned rpt INR  05/07/2022. Will await reading. He also continues amiodarone '200mg'$  daily. Followed by [facility] cardiology clinic. Update CBC today and again in 2 weeks.       Symptomatic anemia    Update CBC today and again in 2 wks in setting of restarting coumadin.       Relevant Orders   CBC with Differential/Platelet (Completed)   Basic metabolic panel (Completed)   CBC with Differential/Platelet   Long-term (current) use of anticoagulants, INR goal 2.0-3.0   Background diabetic retinopathy associated with type 2 diabetes mellitus (HCC)    Saw [facility] Dr [facility] Going with background retinopathy, rec rpt 1 yr.       Other Visit Diagnoses     Need for vaccination against Streptococcus pneumoniae       Relevant Orders   Pneumococcal conjugate vaccine 20-valent (Completed)   Special screening for malignant neoplasm of prostate       Relevant Orders   PSA (Completed)        No orders of the defined types were placed in this encounter.  Orders Placed This Encounter  Procedures   Pneumococcal conjugate vaccine 20-valent   CBC with Differential/Platelet   Basic metabolic panel   PSA   CBC with Differential/Platelet    Standing Status:   Future    Standing Expiration Date:   05/07/2023   Ferritin    Standing Status:   Future    Standing Expiration Date:   05/07/2023   IBC panel    Standing Status:   Future    Standing Expiration Date:   05/07/2023    Patient instructions: [id number] today.  Labs today.  Check with local pharmacy about shingrix series and RSV vaccine.  Advanced directive packet provided today.  Schedule lab visit only in 2 weeks.  Good to see you today. Return as needed or in 6 weeks for follow up visit.   Follow up plan: Return in about 6 weeks (around 06/14/2022), or if symptoms worsen or fail to improve, for follow up visit.  [name], MD

## 2022-05-03 NOTE — Telephone Encounter (Signed)
Called and left  a message for call back. Please referral to genetic testing

## 2022-05-03 NOTE — Patient Instructions (Addendum)
[id number] today.  Labs today.  Check with local pharmacy about shingrix series and RSV vaccine.  Advanced directive packet provided today.  Schedule lab visit only in 2 weeks.  Good to see you today. Return as needed or in 6 weeks for follow up visit.   Health Maintenance After Age 67 After age 74, you are at a higher risk for certain long-term diseases and infections as well as injuries from falls. Falls are a major cause of broken bones and head injuries in people who are older than age 5. Getting regular preventive care can help to keep you healthy and well. Preventive care includes getting regular testing and making lifestyle changes as recommended by your health care provider. Talk with your health care provider about: Which screenings and tests you should have. A screening is a test that checks for a disease when you have no symptoms. A diet and exercise plan that is right for you. What should I know about screenings and tests to prevent falls? Screening and testing are the best ways to find a health problem early. Early diagnosis and treatment give you the best chance of managing medical conditions that are common after age 34. Certain conditions and lifestyle choices may make you more likely to have a fall. Your health care provider may recommend: Regular vision checks. Poor vision and conditions such as cataracts can make you more likely to have a fall. If you wear glasses, make sure to get your prescription updated if your vision changes. Medicine review. Work with your health care provider to regularly review all of the medicines you are taking, including over-the-counter medicines. Ask your health care provider about any side effects that may make you more likely to have a fall. Tell your health care provider if any medicines that you take make you feel dizzy or sleepy. Strength and balance checks. Your health care provider may recommend certain tests to check your strength and balance  while standing, walking, or changing positions. Foot health exam. Foot pain and numbness, as well as not wearing proper footwear, can make you more likely to have a fall. Screenings, including: Osteoporosis screening. Osteoporosis is a condition that causes the bones to get weaker and break more easily. Blood pressure screening. Blood pressure changes and medicines to control blood pressure can make you feel dizzy. Depression screening. You may be more likely to have a fall if you have a fear of falling, feel depressed, or feel unable to do activities that you used to do. Alcohol use screening. Using too much alcohol can affect your balance and may make you more likely to have a fall. Follow these instructions at home: Lifestyle Do not drink alcohol if: Your health care provider tells you not to drink. If you drink alcohol: Limit how much you have to: 0-1 drink a day for women. 0-2 drinks a day for men. Know how much alcohol is in your drink. In the [place] one drink equals one 12 oz bottle of beer (355 mL), one 5 oz glass of wine (148 mL), or one 1 oz glass of hard liquor (44 mL). Do not use any products that contain nicotine or tobacco. These products include cigarettes, chewing tobacco, and vaping devices, such as e-cigarettes. If you need help quitting, ask your health care provider. Activity  Follow a regular exercise program to stay fit. This will help you maintain your balance. Ask your health care provider what types of exercise are appropriate for you. If you need a  cane or walker, use it as recommended by your health care provider. Wear supportive shoes that have nonskid soles. Safety  Remove any tripping hazards, such as rugs, cords, and clutter. Install safety equipment such as grab bars in bathrooms and safety rails on stairs. Keep rooms and walkways well-lit. General instructions Talk with your health care provider about your risks for falling. Tell your health care provider  if: You fall. Be sure to tell your health care provider about all falls, even ones that seem minor. You feel dizzy, tiredness (fatigue), or off-balance. Take over-the-counter and prescription medicines only as told by your health care provider. These include supplements. Eat a healthy diet and maintain a healthy weight. A healthy diet includes low-fat dairy products, low-fat (lean) meats, and fiber from whole grains, beans, and lots of fruits and vegetables. Stay current with your vaccines. Schedule regular health, dental, and eye exams. Summary Having a healthy lifestyle and getting preventive care can help to protect your health and wellness after age 61. Screening and testing are the best way to find a health problem early and help you avoid having a fall. Early diagnosis and treatment give you the best chance for managing medical conditions that are more common for people who are older than age 32. Falls are a major cause of broken bones and head injuries in people who are older than age 27. Take precautions to prevent a fall at home. Work with your health care provider to learn what changes you can make to improve your health and wellness and to prevent falls. This information is not intended to replace advice given to you by your health care provider. Make sure you discuss any questions you have with your health care provider. Document Revised: 09/01/2020 Document Reviewed: 09/01/2020 Elsevier Patient Education  [place].

## 2022-05-03 NOTE — Assessment & Plan Note (Signed)
Preventative protocols reviewed and updated unless pt declined. Discussed healthy diet and lifestyle.  [id number] today Discussed RSV and Shingrix vaccines through pharmacy

## 2022-05-03 NOTE — Telephone Encounter (Signed)
Called and left a message for call back  

## 2022-05-03 NOTE — Telephone Encounter (Signed)
[name] states that patient was a no show for capsule study.

## 2022-05-03 NOTE — Addendum Note (Signed)
Addended by: Ulyess Blossom L on: 05/03/2022 01:52 PM   Modules accepted: Orders

## 2022-05-03 NOTE — Telephone Encounter (Signed)
Patient states he will not have a capsule study done because he know for a fact he had this done. He remember the pill being really big and they only would give him water to swallow it down. He states that then he had to go for a Xray to make sure the capsule came out in his stool. He states he does not know where he had it done but had it done and will not have it again. He states he has a doctor appointment today so will not come for blood work today. He states he will come to the lab when he gets time to come

## 2022-05-04 ENCOUNTER — Encounter: Payer: Self-pay | Admitting: Family Medicine

## 2022-05-04 DIAGNOSIS — E113299 Type 2 diabetes mellitus with mild nonproliferative diabetic retinopathy without macular edema, unspecified eye: Secondary | ICD-10-CM | POA: Insufficient documentation

## 2022-05-04 DIAGNOSIS — K219 Gastro-esophageal reflux disease without esophagitis: Secondary | ICD-10-CM | POA: Diagnosis not present

## 2022-05-04 DIAGNOSIS — Z87891 Personal history of nicotine dependence: Secondary | ICD-10-CM | POA: Diagnosis not present

## 2022-05-04 DIAGNOSIS — I252 Old myocardial infarction: Secondary | ICD-10-CM | POA: Diagnosis not present

## 2022-05-04 DIAGNOSIS — I110 Hypertensive heart disease with heart failure: Secondary | ICD-10-CM | POA: Diagnosis not present

## 2022-05-04 DIAGNOSIS — Z5181 Encounter for therapeutic drug level monitoring: Secondary | ICD-10-CM | POA: Diagnosis not present

## 2022-05-04 DIAGNOSIS — G4733 Obstructive sleep apnea (adult) (pediatric): Secondary | ICD-10-CM | POA: Diagnosis not present

## 2022-05-04 DIAGNOSIS — E1165 Type 2 diabetes mellitus with hyperglycemia: Secondary | ICD-10-CM | POA: Diagnosis not present

## 2022-05-04 DIAGNOSIS — I872 Venous insufficiency (chronic) (peripheral): Secondary | ICD-10-CM | POA: Diagnosis not present

## 2022-05-04 DIAGNOSIS — E785 Hyperlipidemia, unspecified: Secondary | ICD-10-CM | POA: Diagnosis not present

## 2022-05-04 DIAGNOSIS — E876 Hypokalemia: Secondary | ICD-10-CM | POA: Diagnosis not present

## 2022-05-04 DIAGNOSIS — Z9981 Dependence on supplemental oxygen: Secondary | ICD-10-CM | POA: Diagnosis not present

## 2022-05-04 DIAGNOSIS — Z7901 Long term (current) use of anticoagulants: Secondary | ICD-10-CM | POA: Diagnosis not present

## 2022-05-04 DIAGNOSIS — G43909 Migraine, unspecified, not intractable, without status migrainosus: Secondary | ICD-10-CM | POA: Diagnosis not present

## 2022-05-04 DIAGNOSIS — I25119 Atherosclerotic heart disease of native coronary artery with unspecified angina pectoris: Secondary | ICD-10-CM | POA: Diagnosis not present

## 2022-05-04 DIAGNOSIS — J441 Chronic obstructive pulmonary disease with (acute) exacerbation: Secondary | ICD-10-CM | POA: Diagnosis not present

## 2022-05-04 DIAGNOSIS — I5042 Chronic combined systolic (congestive) and diastolic (congestive) heart failure: Secondary | ICD-10-CM | POA: Diagnosis not present

## 2022-05-04 DIAGNOSIS — I420 Dilated cardiomyopathy: Secondary | ICD-10-CM | POA: Diagnosis not present

## 2022-05-04 DIAGNOSIS — Z9181 History of falling: Secondary | ICD-10-CM | POA: Diagnosis not present

## 2022-05-04 DIAGNOSIS — M1990 Unspecified osteoarthritis, unspecified site: Secondary | ICD-10-CM | POA: Diagnosis not present

## 2022-05-04 DIAGNOSIS — F32A Depression, unspecified: Secondary | ICD-10-CM | POA: Diagnosis not present

## 2022-05-04 DIAGNOSIS — Z7984 Long term (current) use of oral hypoglycemic drugs: Secondary | ICD-10-CM | POA: Diagnosis not present

## 2022-05-04 DIAGNOSIS — I4891 Unspecified atrial fibrillation: Secondary | ICD-10-CM | POA: Diagnosis not present

## 2022-05-04 LAB — BASIC METABOLIC PANEL
BUN: 12 mg/dL (ref 6–23)
CO2: 37 mEq/L — ABNORMAL HIGH (ref 19–32)
Calcium: 9.2 mg/dL (ref 8.4–10.5)
Chloride: 94 mEq/L — ABNORMAL LOW (ref 96–112)
Creatinine, Ser: 1.15 mg/dL (ref 0.40–1.50)
GFR: 66.26 mL/min (ref >60.00)
Glucose, Bld: 153 mg/dL — ABNORMAL HIGH (ref 70–99)
Potassium: 4 mEq/L (ref 3.5–5.1)
Sodium: 143 mEq/L (ref 135–145)

## 2022-05-04 LAB — CBC WITH DIFFERENTIAL/PLATELET
Basophils Absolute: 0.1 10*3/uL (ref 0.0–0.1)
Basophils Relative: 0.9 % (ref 0.0–3.0)
Eosinophils Absolute: 0.3 10*3/uL (ref 0.0–0.7)
Eosinophils Relative: 2.6 % (ref 0.0–5.0)
HCT: 31.2 % — ABNORMAL LOW (ref 39.0–52.0)
Hemoglobin: 9.6 g/dL — ABNORMAL LOW (ref 13.0–17.0)
Lymphocytes Relative: 15.7 % (ref 12.0–46.0)
Lymphs Abs: 1.8 10*3/uL (ref 0.7–4.0)
MCHC: 30.8 g/dL (ref 30.0–36.0)
MCV: 76.4 fl — ABNORMAL LOW (ref 78.0–100.0)
Monocytes Absolute: 1.1 10*3/uL — ABNORMAL HIGH (ref 0.1–1.0)
Monocytes Relative: 9.3 % (ref 3.0–12.0)
Neutro Abs: 8.2 10*3/uL — ABNORMAL HIGH (ref 1.4–7.7)
Neutrophils Relative %: 71.5 % (ref 43.0–77.0)
Platelets: 213 10*3/uL (ref 150.0–400.0)
RBC: 4.09 Mil/uL — ABNORMAL LOW (ref 4.22–5.81)
RDW: 29.6 % — ABNORMAL HIGH (ref 11.5–15.5)
WBC: 11.5 10*3/uL — ABNORMAL HIGH (ref 4.0–10.5)

## 2022-05-04 LAB — PSA: PSA: 0.91 ng/mL (ref 0.10–4.00)

## 2022-05-06 ENCOUNTER — Ambulatory Visit (INDEPENDENT_AMBULATORY_CARE_PROVIDER_SITE_OTHER): Payer: Medicare Other

## 2022-05-06 ENCOUNTER — Other Ambulatory Visit: Payer: Self-pay

## 2022-05-06 DIAGNOSIS — I872 Venous insufficiency (chronic) (peripheral): Secondary | ICD-10-CM | POA: Diagnosis not present

## 2022-05-06 DIAGNOSIS — E785 Hyperlipidemia, unspecified: Secondary | ICD-10-CM | POA: Diagnosis not present

## 2022-05-06 DIAGNOSIS — Z7901 Long term (current) use of anticoagulants: Secondary | ICD-10-CM | POA: Diagnosis not present

## 2022-05-06 DIAGNOSIS — D509 Iron deficiency anemia, unspecified: Secondary | ICD-10-CM

## 2022-05-06 DIAGNOSIS — Z9181 History of falling: Secondary | ICD-10-CM | POA: Diagnosis not present

## 2022-05-06 DIAGNOSIS — Z87891 Personal history of nicotine dependence: Secondary | ICD-10-CM | POA: Diagnosis not present

## 2022-05-06 DIAGNOSIS — E1165 Type 2 diabetes mellitus with hyperglycemia: Secondary | ICD-10-CM | POA: Diagnosis not present

## 2022-05-06 DIAGNOSIS — I110 Hypertensive heart disease with heart failure: Secondary | ICD-10-CM | POA: Diagnosis not present

## 2022-05-06 DIAGNOSIS — Z7984 Long term (current) use of oral hypoglycemic drugs: Secondary | ICD-10-CM | POA: Diagnosis not present

## 2022-05-06 DIAGNOSIS — Z5181 Encounter for therapeutic drug level monitoring: Secondary | ICD-10-CM | POA: Diagnosis not present

## 2022-05-06 DIAGNOSIS — I4891 Unspecified atrial fibrillation: Secondary | ICD-10-CM | POA: Diagnosis not present

## 2022-05-06 DIAGNOSIS — Z9981 Dependence on supplemental oxygen: Secondary | ICD-10-CM | POA: Diagnosis not present

## 2022-05-06 DIAGNOSIS — G4733 Obstructive sleep apnea (adult) (pediatric): Secondary | ICD-10-CM | POA: Diagnosis not present

## 2022-05-06 DIAGNOSIS — I420 Dilated cardiomyopathy: Secondary | ICD-10-CM | POA: Diagnosis not present

## 2022-05-06 DIAGNOSIS — E876 Hypokalemia: Secondary | ICD-10-CM | POA: Diagnosis not present

## 2022-05-06 DIAGNOSIS — F32A Depression, unspecified: Secondary | ICD-10-CM | POA: Diagnosis not present

## 2022-05-06 DIAGNOSIS — I252 Old myocardial infarction: Secondary | ICD-10-CM | POA: Diagnosis not present

## 2022-05-06 DIAGNOSIS — M1990 Unspecified osteoarthritis, unspecified site: Secondary | ICD-10-CM | POA: Diagnosis not present

## 2022-05-06 DIAGNOSIS — K219 Gastro-esophageal reflux disease without esophagitis: Secondary | ICD-10-CM | POA: Diagnosis not present

## 2022-05-06 DIAGNOSIS — I25119 Atherosclerotic heart disease of native coronary artery with unspecified angina pectoris: Secondary | ICD-10-CM | POA: Diagnosis not present

## 2022-05-06 DIAGNOSIS — G43909 Migraine, unspecified, not intractable, without status migrainosus: Secondary | ICD-10-CM | POA: Diagnosis not present

## 2022-05-06 DIAGNOSIS — J441 Chronic obstructive pulmonary disease with (acute) exacerbation: Secondary | ICD-10-CM | POA: Diagnosis not present

## 2022-05-06 DIAGNOSIS — I5042 Chronic combined systolic (congestive) and diastolic (congestive) heart failure: Secondary | ICD-10-CM | POA: Diagnosis not present

## 2022-05-06 LAB — POCT INR: INR: 1.1 — AB (ref 2.0–3.0)

## 2022-05-06 NOTE — Progress Notes (Signed)
[name] from [facility], [phone], called with INR result of 1.1 Pt last in hospital on 1/3 with colonoscopy for hematochezia. Warfarin was not restarted until 1/6 at normal dosing due to concern for another bleeding event. Pt has not taken any booster doses due to prior bleeding. Pt denies any current bleeding. Advised of s/s of bleeding and clots. [facility] nurse goes to pt's home on Thurs or Fri so did not want to wait until next week.  Continue to take 1/2 tablet daily except take no warfarin on Sundays. Recheck INR in one week. Instructions given to [name] and pt, both verbalize understanding.

## 2022-05-06 NOTE — Assessment & Plan Note (Signed)
Latest imaging study showing stable 3.7cm dilation 08/2021 - rec rpt imaging 2-3 yrs.

## 2022-05-06 NOTE — Assessment & Plan Note (Signed)
Update CBC today and again in 2 wks in setting of restarting coumadin.

## 2022-05-06 NOTE — Telephone Encounter (Signed)
I spoke with patient at our [facility] 05/03/2022.  He did have an inconclusive capsule endoscopy done by Dr Virgina Jock at [facility] 09/2021. I received report this week and I will ask to STAT scan into chart - impression read: poor visualization of stomach due to food content, small bowel was not visualized, rec esophagram and upper GI study to evaluate for stricture/dysmotility.    After reviewing previously inconclusive capsule endoscopy with patient, he states he would agree to rpt capsule endoscopy. He does not want to return to [facility] and wants to follow with [facility].   I'm sorry for the confusion regarding this patient.

## 2022-05-06 NOTE — Patient Instructions (Addendum)
Pre visit review using our clinic review tool, if applicable. No additional management support is needed unless otherwise documented below in the visit note.  Continue to take 1/2 tablet daily except take no warfarin on Sundays. Recheck INR in one week.

## 2022-05-06 NOTE — Assessment & Plan Note (Signed)
Diabetes well controlled based on recent A1c - continue glimepiride and farxiga.

## 2022-05-06 NOTE — Assessment & Plan Note (Signed)
Saw [facility] Dr Wallace Going with background retinopathy, rec rpt 1 yr.

## 2022-05-06 NOTE — Addendum Note (Signed)
Addended by: [name] on: 05/06/2022 10:40 AM   Modules accepted: Orders

## 2022-05-06 NOTE — Assessment & Plan Note (Addendum)
Coumadin restarted 05/01/2022 with planned rpt INR 05/07/2022. Will await reading. He also continues amiodarone '200mg'$  daily. Followed by [facility] cardiology clinic. Update CBC today and again in 2 weeks.

## 2022-05-06 NOTE — Assessment & Plan Note (Signed)
Still needs pulm eval (sleep study, PFTs).

## 2022-05-06 NOTE — Assessment & Plan Note (Signed)
Stable period on simvastatin '20mg'$  - continue. The ASCVD Risk score (Arnett DK, et al., 2019) failed to calculate for the following reasons:   The patient has a prior MI or stroke diagnosis

## 2022-05-06 NOTE — Assessment & Plan Note (Signed)
S/p 2u RBC transfusion 01/2022 and 03/2022 as well as iron infusion 03/2022.  He continues taking fusion plus pills.  Multiple polyps on recent colonoscopy as well as large 1.2cm duodenal polyp on endoscopy.  Next step is capsule endoscopy - after reviewing inconclusive capsule endoscopy report from 09/2021 with patient, he agrees to repeat through [facility] - will notify Dr Verlin Grills office.

## 2022-05-06 NOTE — Telephone Encounter (Signed)
Called patient and patient agreed to have capsule study. Got patient schedule for capsule study on 05/24/2022. Went over instructions and mailed them to patient

## 2022-05-10 ENCOUNTER — Telehealth: Payer: Self-pay | Admitting: Family Medicine

## 2022-05-10 NOTE — Telephone Encounter (Signed)
Agree with this. Thanks.  

## 2022-05-10 NOTE — Telephone Encounter (Signed)
Spoke with [name] informing her Dr. Darnell Level is giving verbal orders for services requested.

## 2022-05-10 NOTE — Telephone Encounter (Signed)
Home Health verbal orders Caller Name: Aimee Agency Name: [place] number: [phone]  Requesting Nursing  Reason: Recert  Frequency: 1x a week for 9 weeks  Please forward to [facility] pool or providers CMA

## 2022-05-11 ENCOUNTER — Ambulatory Visit: Payer: Medicare Other

## 2022-05-11 NOTE — Progress Notes (Deleted)
Electrophysiology Office Follow up Visit Note:    Date:  05/11/2022   ID:  CAEDAN WILBER, DOB 1956/01/29, MRN [id number]  PCP:  [name], MD  [facility] Cardiologist:  None  [facility] Electrophysiologist:  Vickie Epley, MD    Interval History:    [name] is a 67 y.o. male who presents for a follow up visit. They were last seen in clinic 09/16/2021 for AF. He was previously takign amiodarone for rhythm control. He was prescribed coumadin at the last appointment given he was unable to afford eliquis.        Past Medical History:  Diagnosis Date   Abnormal drug screen 2015   MJ positive x3 - one more and we will stop prescribing ativan (08/2013).   Anemia    Angina    Anxiety    Arrhythmia    atrial fibrillation   Arthritis    CAD (coronary artery disease)    nonobstructive   Cannabis abuse    Chronic systolic CHF (congestive heart failure) ([place]) 2013   NYHA Class II/III   COPD (chronic obstructive pulmonary disease) ([place])    Diabetes type 2, uncontrolled    declines DSME   Dilated cardiomyopathy ([place]) 2013   now improved   Ex-smoker    Frequent headaches    History of atrial fibrillation 2013   chronic, s/p ablation prior on coumadin   Hyperlipidemia    Hypertension    Migraine    Narcolepsy    Nonischemic cardiomyopathy ([place]) 2013   EF 25% per Dr Nehemiah Massed   Obesity    OSA (obstructive sleep apnea)    does not use CPAP - unable to tolerate   Seasonal allergies     Past Surgical History:  Procedure Laterality Date   ATRIAL FLUTTER ABLATION N/A 09/21/2011   Procedure: ATRIAL FLUTTER ABLATION;  Surgeon: Thompson Grayer, MD;  Location: [facility] CATH LAB;  Service: Cardiovascular;  Laterality: N/A;   CARDIAC ELECTROPHYSIOLOGY MAPPING AND ABLATION  2013   for atrial flutter   CARDIOVASCULAR STRESS TEST  03/2012   ETT WNL Nehemiah Massed)   CARDIOVERSION N/A 08/12/2021   Procedure: CARDIOVERSION;  Surgeon: Corey Skains, MD;  Location:  [facility] ORS;  Service: Cardiovascular;  Laterality: N/A;   COLONOSCOPY WITH PROPOFOL N/A 05/22/2020   TA, diverticulosis, descending colon ulcer biopsy WNL, int hem Allen Norris, Darren, MD)   COLONOSCOPY WITH PROPOFOL N/A 04/28/2022   multiple TAs - 10 small, 2 1.2cm, 2 18m, rpt 3 yrs (Vanga, RTally Due MD)   ESOPHAGOGASTRODUODENOSCOPY (EGD) WITH PROPOFOL N/A 05/22/2020   WNL ([place]ohl)   ESOPHAGOGASTRODUODENOSCOPY (EGD) WITH PROPOFOL N/A 04/28/2022   1105mduodenal TA, reactive gastropathy, enteric mucosa in stomach biopsy of unclear significance (Vanga, RoTally DueMD)   [place]  04/28/2022   Procedure: GIVENS CAPSULE STUDY;  Surgeon: VaLin LandsmanMD;  [facility];  Service: Endoscopy;;   [place]  2014   EF 50%, nl LV fxn, RV nl size/function, mild mitral insuff    Current Medications: No outpatient medications have been marked as taking for the 05/12/22 encounter (Appointment) with LaVickie EpleyMD.     Allergies:   Atorvastatin and Lisinopril   Social History   Socioeconomic History   Marital status: Single    Spouse name: Not on file   Number of children: 0   Years of education: Not on file   Highest education level: Not on file  Occupational History   Occupation: disable  Tobacco Use   Smoking status: Former    Packs/day: 1.00    Years: 31.00    Total pack years: 31.00    Types: Cigarettes    Quit date: 04/26/2018    Years since quitting: 4.0   Smokeless tobacco: Never  Vaping Use   Vaping Use: Never used  Substance and Sexual Activity   Alcohol use: No    Alcohol/week: 0.0 standard drinks of alcohol   Drug use: Yes    Types: Marijuana   Sexual activity: Not Currently  Other Topics Concern   Not on file  Social History Narrative   Pt lives in [place]   Divorced   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   Disability due to neck arthritis, anxiety and sleep apnea   Edu: HS   Activity: no regular exercise, mows lawn  (riding and push)   Diet: good water, fruits/vegetables daily   Social Determinants of Health   Financial Resource Strain: Medium Risk (10/23/2021)   Overall Financial Resource Strain (CARDIA)    Difficulty of Paying Living Expenses: Somewhat hard  Food Insecurity: No Food Insecurity (03/05/2022)   Hunger Vital Sign    Worried About Running Out of Food in the Last Year: Never true    Ran Out of Food in the Last Year: Never true  Transportation Needs: Unmet Transportation Needs (03/05/2022)   PRAPARE - Transportation    Lack of Transportation (Medical): Yes    Lack of Transportation (Non-Medical): Yes  Physical Activity: Inactive (09/24/2021)   Exercise Vital Sign    Days of Exercise per Week: 0 days    Minutes of Exercise per Session: 0 min  Stress: No Stress Concern Present (09/24/2021)   [place]    Feeling of Stress : Not at all  Social Connections: Not on file     Family History: The patient's family history includes CAD (age of onset: 99) in his maternal grandfather; Cancer (age of onset: 31) in his brother; Cancer (age of onset: 46) in his mother; Diabetes in an other family member; Heart failure (age of onset: 62) in his father; Hypertension in his maternal grandfather.  ROS:   Please see the history of present illness.    All other systems reviewed and are negative.  EKGs/Labs/Other Studies Reviewed:    The following studies were reviewed today:  11/19/2021 Echo - EF 60-65%    EKG:  The ekg ordered today demonstrates ***  Recent Labs: 07/15/2021: TSH 2.81 07/20/2021: Pro B Natriuretic peptide (BNP) 153.0 01/04/2022: Magnesium 1.9 02/22/2022: ALT 17; B Natriuretic Peptide 130.6 05/03/2022: BUN 12; Creatinine, Ser 1.15; Hemoglobin 9.6; Platelets 213.0; Potassium 4.0; Sodium 143  Recent Lipid Panel    Component Value Date/Time   CHOL 104 01/04/2022 0615   CHOL 203 (H) 01/24/2013 0407   TRIG 59  01/04/2022 0615   TRIG 355 (H) 01/24/2013 0407   HDL 42 01/04/2022 0615   HDL 34 (L) 01/24/2013 0407   CHOLHDL 2.5 01/04/2022 0615   VLDL 12 01/04/2022 0615   VLDL 71 (H) 01/24/2013 0407   LDLCALC 50 01/04/2022 0615   LDLCALC 98 01/24/2013 0407   LDLDIRECT 97.0 07/09/2020 0757    Physical Exam:    VS:  There were no vitals taken for this visit.    Wt Readings from Last 3 Encounters:  05/03/22 239 lb 8 oz (108.6 kg)  04/28/22 237 lb (107.5 kg)  04/23/22 232 lb (105.2 kg)     GEN: *** Well  nourished, well developed in no acute distress CARDIAC: ***RRR, no murmurs, rubs, gallops RESPIRATORY:  Clear to auscultation without rales, wheezing or rhonchi        ASSESSMENT:    1. Persistent atrial fibrillation ([place])   2. Chronic diastolic heart failure ([place])   3. Encounter for long-term (current) use of high-risk medication    PLAN:    In order of problems listed above:  #Persistent AF On amiodarone. Previously prescribed coumadin. Could not afford Eliquis. Hospitalized with anemia earlier this month.   #Amiodarone monitoring LFT's last checked in October 2023. TSH last checked March 2023.  Repeat CMP, TSH and FT4 today.      Total time spent with patient today *** minutes. This includes reviewing records, evaluating the patient and coordinating care.   Medication Adjustments/Labs and Tests Ordered: Current medicines are reviewed at length with the patient today.  Concerns regarding medicines are outlined above.  No orders of the defined types were placed in this encounter.  No orders of the defined types were placed in this encounter.    Signed, Lars Mage, MD, [facility], [facility] 05/11/2022 6:04 PM    Electrophysiology [facility]

## 2022-05-12 ENCOUNTER — Ambulatory Visit: Payer: Medicare Other | Admitting: Cardiology

## 2022-05-12 DIAGNOSIS — Z79899 Other long term (current) drug therapy: Secondary | ICD-10-CM

## 2022-05-12 DIAGNOSIS — I5032 Chronic diastolic (congestive) heart failure: Secondary | ICD-10-CM

## 2022-05-12 DIAGNOSIS — I4819 Other persistent atrial fibrillation: Secondary | ICD-10-CM

## 2022-05-13 ENCOUNTER — Ambulatory Visit (INDEPENDENT_AMBULATORY_CARE_PROVIDER_SITE_OTHER): Payer: Medicare Other

## 2022-05-13 ENCOUNTER — Inpatient Hospital Stay: Payer: Medicare Other | Attending: Oncology

## 2022-05-13 VITALS — BP 123/74 | HR 77 | Temp 97.6°F | Resp 19

## 2022-05-13 DIAGNOSIS — I4891 Unspecified atrial fibrillation: Secondary | ICD-10-CM | POA: Diagnosis not present

## 2022-05-13 DIAGNOSIS — I420 Dilated cardiomyopathy: Secondary | ICD-10-CM | POA: Diagnosis not present

## 2022-05-13 DIAGNOSIS — I110 Hypertensive heart disease with heart failure: Secondary | ICD-10-CM | POA: Diagnosis not present

## 2022-05-13 DIAGNOSIS — Z7984 Long term (current) use of oral hypoglycemic drugs: Secondary | ICD-10-CM | POA: Diagnosis not present

## 2022-05-13 DIAGNOSIS — Z9181 History of falling: Secondary | ICD-10-CM | POA: Diagnosis not present

## 2022-05-13 DIAGNOSIS — I5042 Chronic combined systolic (congestive) and diastolic (congestive) heart failure: Secondary | ICD-10-CM | POA: Diagnosis not present

## 2022-05-13 DIAGNOSIS — I25119 Atherosclerotic heart disease of native coronary artery with unspecified angina pectoris: Secondary | ICD-10-CM | POA: Diagnosis not present

## 2022-05-13 DIAGNOSIS — M1990 Unspecified osteoarthritis, unspecified site: Secondary | ICD-10-CM | POA: Diagnosis not present

## 2022-05-13 DIAGNOSIS — G43909 Migraine, unspecified, not intractable, without status migrainosus: Secondary | ICD-10-CM | POA: Diagnosis not present

## 2022-05-13 DIAGNOSIS — F32A Depression, unspecified: Secondary | ICD-10-CM | POA: Diagnosis not present

## 2022-05-13 DIAGNOSIS — Z7901 Long term (current) use of anticoagulants: Secondary | ICD-10-CM

## 2022-05-13 DIAGNOSIS — J449 Chronic obstructive pulmonary disease, unspecified: Secondary | ICD-10-CM | POA: Diagnosis not present

## 2022-05-13 DIAGNOSIS — D508 Other iron deficiency anemias: Secondary | ICD-10-CM

## 2022-05-13 DIAGNOSIS — D509 Iron deficiency anemia, unspecified: Secondary | ICD-10-CM | POA: Diagnosis not present

## 2022-05-13 DIAGNOSIS — I872 Venous insufficiency (chronic) (peripheral): Secondary | ICD-10-CM | POA: Diagnosis not present

## 2022-05-13 DIAGNOSIS — E1165 Type 2 diabetes mellitus with hyperglycemia: Secondary | ICD-10-CM | POA: Diagnosis not present

## 2022-05-13 DIAGNOSIS — Z9981 Dependence on supplemental oxygen: Secondary | ICD-10-CM | POA: Diagnosis not present

## 2022-05-13 DIAGNOSIS — E785 Hyperlipidemia, unspecified: Secondary | ICD-10-CM | POA: Diagnosis not present

## 2022-05-13 DIAGNOSIS — E876 Hypokalemia: Secondary | ICD-10-CM | POA: Diagnosis not present

## 2022-05-13 DIAGNOSIS — K219 Gastro-esophageal reflux disease without esophagitis: Secondary | ICD-10-CM | POA: Diagnosis not present

## 2022-05-13 DIAGNOSIS — Z87891 Personal history of nicotine dependence: Secondary | ICD-10-CM | POA: Diagnosis not present

## 2022-05-13 DIAGNOSIS — Z5181 Encounter for therapeutic drug level monitoring: Secondary | ICD-10-CM | POA: Diagnosis not present

## 2022-05-13 DIAGNOSIS — I252 Old myocardial infarction: Secondary | ICD-10-CM | POA: Diagnosis not present

## 2022-05-13 DIAGNOSIS — G4733 Obstructive sleep apnea (adult) (pediatric): Secondary | ICD-10-CM | POA: Diagnosis not present

## 2022-05-13 LAB — POCT INR: INR: 1.3 — AB (ref 2.0–3.0)

## 2022-05-13 MED ORDER — SODIUM CHLORIDE 0.9 % IV SOLN
510.0000 mg | INTRAVENOUS | Status: DC
  Administered 2022-05-13: 510 mg via INTRAVENOUS
  Filled 2022-05-13: qty 510

## 2022-05-13 MED ORDER — SODIUM CHLORIDE 0.9 % IV SOLN
INTRAVENOUS | Status: DC
  Filled 2022-05-13: qty 250

## 2022-05-13 NOTE — Progress Notes (Addendum)
[name] from [facility], [phone], called with INR result of 1.3 Not eating as much, normally one meal a day currently. Pt also started taking Fe Fum-Fe Poly-Vit C-Lactobac (FUSION PO), prescribed by GI for iron deficiency. He is taking this in the morning with warfarin. This interacts with warfarin and can decrease the effectiveness of warfarin. This could be the main cause why pt's INR is low for the last 2 weeks. Advised pt/[name] [facility] nurse) to take Fusion in the morning and move warfarin to the evening. Advised if any s/s of bleeding to contact office or go to the ER.  Increase dose today to take 1 tablet and increase dose tomorrow to take 1 tablet and then change weekly dose to take 1/2 tablet daily. Recheck INR in one week. Instructions given to [name] and pt, both verbalize understanding.

## 2022-05-13 NOTE — Patient Instructions (Signed)
Pre visit review using our clinic review tool, if applicable. No additional management support is needed unless otherwise documented below in the visit note.  Increase dose today to take 1 tablet and increase dose tomorrow to take 1 tablet and then change weekly dose to take 1/2 tablet daily. Recheck INR in one week.

## 2022-05-13 NOTE — Patient Instructions (Signed)
[facility] AT [place]  Discharge Instructions: Thank you for choosing [facility] to provide your oncology and hematology care.  If you have a lab appointment with the [facility], please go directly to the [facility] and check in at the registration area.  Wear comfortable clothing and clothing appropriate for easy access to any Portacath or PICC line.   We strive to give you quality time with your provider. You may need to reschedule your appointment if you arrive late (15 or more minutes).  Arriving late affects you and other patients whose appointments are after yours.  Also, if you miss three or more appointments without notifying the office, you may be dismissed from the clinic at the provider's discretion.      For prescription refill requests, have your pharmacy contact our office and allow 72 hours for refills to be completed.    Today you received the following chemotherapy and/or immunotherapy agents Feraheme      To help prevent nausea and vomiting after your treatment, we encourage you to take your nausea medication as directed.  BELOW ARE SYMPTOMS THAT SHOULD BE REPORTED IMMEDIATELY: *FEVER GREATER THAN 100.4 F (38 C) OR HIGHER *CHILLS OR SWEATING *NAUSEA AND VOMITING THAT IS NOT CONTROLLED WITH YOUR NAUSEA MEDICATION *UNUSUAL SHORTNESS OF BREATH *UNUSUAL BRUISING OR BLEEDING *URINARY PROBLEMS (pain or burning when urinating, or frequent urination) *BOWEL PROBLEMS (unusual diarrhea, constipation, pain near the anus) TENDERNESS IN MOUTH AND THROAT WITH OR WITHOUT PRESENCE OF ULCERS (sore throat, sores in mouth, or a toothache) UNUSUAL RASH, SWELLING OR PAIN  UNUSUAL VAGINAL DISCHARGE OR ITCHING   Items with * indicate a potential emergency and should be followed up as soon as possible or go to the Emergency Department if any problems should occur.  Please show the CHEMOTHERAPY ALERT CARD or IMMUNOTHERAPY ALERT CARD at check-in to  the Emergency Department and triage nurse.  Should you have questions after your visit or need to cancel or reschedule your appointment, please contact [facility] CANC[facility] [place] AT [place]  [phone] and follow the prompts.  Office hours are 8:00 a.m. to 4:30 p.m. Monday - Friday. Please note that voicemails left after 4:00 p.m. may not be returned until the following business day.  We are closed weekends and major holidays. You have access to a nurse at all times for urgent questions. Please call the main number to the clinic [phone] and follow the prompts.  For any non-urgent questions, you may also contact your provider using MyChart. We now offer e-Visits for anyone 68 and older to request care online for non-urgent symptoms. For details visit [email or web address].   Also download the MyChart app! Go to the app store, search "MyChart", open the app, select [place], and log in with your MyChart username and password.

## 2022-05-16 DIAGNOSIS — G4733 Obstructive sleep apnea (adult) (pediatric): Secondary | ICD-10-CM | POA: Diagnosis not present

## 2022-05-16 DIAGNOSIS — J9601 Acute respiratory failure with hypoxia: Secondary | ICD-10-CM | POA: Diagnosis not present

## 2022-05-17 ENCOUNTER — Other Ambulatory Visit: Payer: Self-pay | Admitting: Family Medicine

## 2022-05-17 DIAGNOSIS — J449 Chronic obstructive pulmonary disease, unspecified: Secondary | ICD-10-CM

## 2022-05-17 DIAGNOSIS — M1990 Unspecified osteoarthritis, unspecified site: Secondary | ICD-10-CM

## 2022-05-17 DIAGNOSIS — E876 Hypokalemia: Secondary | ICD-10-CM

## 2022-05-17 DIAGNOSIS — Z87891 Personal history of nicotine dependence: Secondary | ICD-10-CM

## 2022-05-17 DIAGNOSIS — F32A Depression, unspecified: Secondary | ICD-10-CM

## 2022-05-17 DIAGNOSIS — I420 Dilated cardiomyopathy: Secondary | ICD-10-CM

## 2022-05-17 DIAGNOSIS — Z5181 Encounter for therapeutic drug level monitoring: Secondary | ICD-10-CM

## 2022-05-17 DIAGNOSIS — G43909 Migraine, unspecified, not intractable, without status migrainosus: Secondary | ICD-10-CM

## 2022-05-17 DIAGNOSIS — Z9981 Dependence on supplemental oxygen: Secondary | ICD-10-CM

## 2022-05-17 DIAGNOSIS — E1165 Type 2 diabetes mellitus with hyperglycemia: Secondary | ICD-10-CM

## 2022-05-17 DIAGNOSIS — I5042 Chronic combined systolic (congestive) and diastolic (congestive) heart failure: Secondary | ICD-10-CM

## 2022-05-17 DIAGNOSIS — I110 Hypertensive heart disease with heart failure: Secondary | ICD-10-CM

## 2022-05-17 DIAGNOSIS — E785 Hyperlipidemia, unspecified: Secondary | ICD-10-CM

## 2022-05-17 DIAGNOSIS — G4733 Obstructive sleep apnea (adult) (pediatric): Secondary | ICD-10-CM

## 2022-05-17 DIAGNOSIS — I4891 Unspecified atrial fibrillation: Secondary | ICD-10-CM

## 2022-05-17 DIAGNOSIS — Z7901 Long term (current) use of anticoagulants: Secondary | ICD-10-CM

## 2022-05-17 DIAGNOSIS — K219 Gastro-esophageal reflux disease without esophagitis: Secondary | ICD-10-CM

## 2022-05-17 DIAGNOSIS — Z6841 Body Mass Index (BMI) 40.0 and over, adult: Secondary | ICD-10-CM

## 2022-05-17 DIAGNOSIS — I252 Old myocardial infarction: Secondary | ICD-10-CM

## 2022-05-17 DIAGNOSIS — Z9181 History of falling: Secondary | ICD-10-CM

## 2022-05-17 DIAGNOSIS — I872 Venous insufficiency (chronic) (peripheral): Secondary | ICD-10-CM

## 2022-05-17 DIAGNOSIS — F419 Anxiety disorder, unspecified: Secondary | ICD-10-CM

## 2022-05-17 DIAGNOSIS — I25119 Atherosclerotic heart disease of native coronary artery with unspecified angina pectoris: Secondary | ICD-10-CM

## 2022-05-17 DIAGNOSIS — Z7984 Long term (current) use of oral hypoglycemic drugs: Secondary | ICD-10-CM

## 2022-05-17 NOTE — Telephone Encounter (Signed)
Refill request simvastatin Last refill 12/29/21 #90 Last office visit 05/03/22 See allergy/contraindication

## 2022-05-18 NOTE — Telephone Encounter (Signed)
ERx 

## 2022-05-19 ENCOUNTER — Other Ambulatory Visit (INDEPENDENT_AMBULATORY_CARE_PROVIDER_SITE_OTHER): Payer: Medicare Other

## 2022-05-19 DIAGNOSIS — D649 Anemia, unspecified: Secondary | ICD-10-CM

## 2022-05-19 DIAGNOSIS — D508 Other iron deficiency anemias: Secondary | ICD-10-CM | POA: Diagnosis not present

## 2022-05-19 LAB — CBC WITH DIFFERENTIAL/PLATELET
Basophils Absolute: 0.1 10*3/uL (ref 0.0–0.1)
Basophils Relative: 0.9 % (ref 0.0–3.0)
Eosinophils Absolute: 0.3 10*3/uL (ref 0.0–0.7)
Eosinophils Relative: 2.7 % (ref 0.0–5.0)
HCT: 32.7 % — ABNORMAL LOW (ref 39.0–52.0)
Hemoglobin: 10.2 g/dL — ABNORMAL LOW (ref 13.0–17.0)
Lymphocytes Relative: 16.7 % (ref 12.0–46.0)
Lymphs Abs: 1.9 10*3/uL (ref 0.7–4.0)
MCHC: 31.2 g/dL (ref 30.0–36.0)
MCV: 80 fl (ref 78.0–100.0)
Monocytes Absolute: 1.2 10*3/uL — ABNORMAL HIGH (ref 0.1–1.0)
Monocytes Relative: 10.1 % (ref 3.0–12.0)
Neutro Abs: 7.9 10*3/uL — ABNORMAL HIGH (ref 1.4–7.7)
Neutrophils Relative %: 69.6 % (ref 43.0–77.0)
Platelets: 329 10*3/uL (ref 150.0–400.0)
RBC: 4.09 Mil/uL — ABNORMAL LOW (ref 4.22–5.81)
RDW: 28 % — ABNORMAL HIGH (ref 11.5–15.5)
WBC: 11.4 10*3/uL — ABNORMAL HIGH (ref 4.0–10.5)

## 2022-05-19 LAB — IBC PANEL
Iron: 47 ug/dL (ref 42–165)
Saturation Ratios: 13.2 % — ABNORMAL LOW (ref 20.0–50.0)
TIBC: 357 ug/dL (ref 250.0–450.0)
Transferrin: 255 mg/dL (ref 212.0–360.0)

## 2022-05-19 LAB — FERRITIN: Ferritin: 182.7 ng/mL (ref 22.0–322.0)

## 2022-05-20 ENCOUNTER — Telehealth: Payer: Self-pay

## 2022-05-20 ENCOUNTER — Ambulatory Visit (INDEPENDENT_AMBULATORY_CARE_PROVIDER_SITE_OTHER): Payer: Medicare Other

## 2022-05-20 DIAGNOSIS — E1165 Type 2 diabetes mellitus with hyperglycemia: Secondary | ICD-10-CM | POA: Diagnosis not present

## 2022-05-20 DIAGNOSIS — E876 Hypokalemia: Secondary | ICD-10-CM | POA: Diagnosis not present

## 2022-05-20 DIAGNOSIS — I110 Hypertensive heart disease with heart failure: Secondary | ICD-10-CM | POA: Diagnosis not present

## 2022-05-20 DIAGNOSIS — M1990 Unspecified osteoarthritis, unspecified site: Secondary | ICD-10-CM | POA: Diagnosis not present

## 2022-05-20 DIAGNOSIS — I5042 Chronic combined systolic (congestive) and diastolic (congestive) heart failure: Secondary | ICD-10-CM | POA: Diagnosis not present

## 2022-05-20 DIAGNOSIS — I25119 Atherosclerotic heart disease of native coronary artery with unspecified angina pectoris: Secondary | ICD-10-CM | POA: Diagnosis not present

## 2022-05-20 DIAGNOSIS — I420 Dilated cardiomyopathy: Secondary | ICD-10-CM | POA: Diagnosis not present

## 2022-05-20 DIAGNOSIS — Z5181 Encounter for therapeutic drug level monitoring: Secondary | ICD-10-CM | POA: Diagnosis not present

## 2022-05-20 DIAGNOSIS — K219 Gastro-esophageal reflux disease without esophagitis: Secondary | ICD-10-CM | POA: Diagnosis not present

## 2022-05-20 DIAGNOSIS — G43909 Migraine, unspecified, not intractable, without status migrainosus: Secondary | ICD-10-CM | POA: Diagnosis not present

## 2022-05-20 DIAGNOSIS — E785 Hyperlipidemia, unspecified: Secondary | ICD-10-CM | POA: Diagnosis not present

## 2022-05-20 DIAGNOSIS — Z7901 Long term (current) use of anticoagulants: Secondary | ICD-10-CM | POA: Diagnosis not present

## 2022-05-20 DIAGNOSIS — F32A Depression, unspecified: Secondary | ICD-10-CM | POA: Diagnosis not present

## 2022-05-20 DIAGNOSIS — I252 Old myocardial infarction: Secondary | ICD-10-CM | POA: Diagnosis not present

## 2022-05-20 DIAGNOSIS — Z9981 Dependence on supplemental oxygen: Secondary | ICD-10-CM | POA: Diagnosis not present

## 2022-05-20 DIAGNOSIS — Z9181 History of falling: Secondary | ICD-10-CM | POA: Diagnosis not present

## 2022-05-20 DIAGNOSIS — I872 Venous insufficiency (chronic) (peripheral): Secondary | ICD-10-CM | POA: Diagnosis not present

## 2022-05-20 DIAGNOSIS — J449 Chronic obstructive pulmonary disease, unspecified: Secondary | ICD-10-CM | POA: Diagnosis not present

## 2022-05-20 DIAGNOSIS — G4733 Obstructive sleep apnea (adult) (pediatric): Secondary | ICD-10-CM | POA: Diagnosis not present

## 2022-05-20 DIAGNOSIS — Z87891 Personal history of nicotine dependence: Secondary | ICD-10-CM | POA: Diagnosis not present

## 2022-05-20 DIAGNOSIS — I4891 Unspecified atrial fibrillation: Secondary | ICD-10-CM | POA: Diagnosis not present

## 2022-05-20 DIAGNOSIS — Z7984 Long term (current) use of oral hypoglycemic drugs: Secondary | ICD-10-CM | POA: Diagnosis not present

## 2022-05-20 LAB — POCT INR: INR: 1.1 — AB (ref 2.0–3.0)

## 2022-05-20 NOTE — Telephone Encounter (Signed)
Patient called and stated he was returning [place] phone call. Call back number [phone].

## 2022-05-20 NOTE — Telephone Encounter (Signed)
Lvm asking pt to call back. Need to relay lab results and Dr. Synthia Innocent message. (See Labs, Result Notes- 124/24.)  Labs/Dr. Synthia Innocent msg: You remain anemic but maintaining  and slowly improving with hemoglobin from 9.6 to 10.2. Your iron levels and stores are improving after recent iron infusion.

## 2022-05-20 NOTE — Patient Instructions (Addendum)
Pre visit review using our clinic review tool, if applicable. No additional management support is needed unless otherwise documented below in the visit note.  Increase dose today to take 1 tablet and increase dose tomorrow to take 1 tablet and then change weekly dose to take 1/2 tablet daily. Recheck INR in one week.

## 2022-05-20 NOTE — Telephone Encounter (Signed)
Noted  

## 2022-05-20 NOTE — Progress Notes (Signed)
[name] from [facility], [phone], called with INR result of 1.1 Per [facility] nurse, pt had diarrhea x 1 day yesterday and missed dose one week ago on Friday. Pt also did not take the 1/2 tablet on Sunday that was changed with last week's dosing. Pt also started taking Fe Fum-Fe Poly-Vit C-Lactobac (FUSION PO), prescribed by GI for iron deficiency. He is taking this in the morning with warfarin. This interacts with warfarin and can decrease the effectiveness of warfarin. This could be the main cause why pt's INR is low for the last 2 weeks. Advised pt/[name] nurse) last week to take Fusion in the morning and move warfarin to the evening. [name] reports pt did not move warfarin to take in the evening and continued to take it in the morning with the iron supplement. Pt reports he stopped the Fusion two days ago because he was told by GI to stop it due to a capsule study he is having on 1/29. But pt is going to restart the Fusion medication because he is going to cancel the capsule study.  Advised pt and [place] nurse to move warfarin to evening dosing. Both verbalized understanding.  Increase dose today to take 1 tablet and increase dose tomorrow to take 1 tablet and then change weekly dose to take 1/2 tablet daily. Recheck INR in one week.  Pt currently denies any bleeding. Advised if any s/s of abnormal bruising or bleeding to contact office or go to the ER. Instructions given to [name] and pt, both verbalize understanding.

## 2022-05-20 NOTE — Telephone Encounter (Signed)
Contacted pt with coumadin instructions for INR today. Gave pt lab results and Dr. Lajean Saver. Pt verbalized understanding.

## 2022-05-21 ENCOUNTER — Ambulatory Visit: Payer: Medicare Other | Attending: Family | Admitting: Family

## 2022-05-21 ENCOUNTER — Telehealth: Payer: Self-pay | Admitting: Gastroenterology

## 2022-05-21 ENCOUNTER — Encounter: Payer: Self-pay | Admitting: Family

## 2022-05-21 ENCOUNTER — Telehealth: Payer: Self-pay

## 2022-05-21 ENCOUNTER — Encounter: Payer: Self-pay | Admitting: Pharmacy Technician

## 2022-05-21 VITALS — BP 120/76 | HR 74 | Resp 20 | Wt 237.1 lb

## 2022-05-21 DIAGNOSIS — R42 Dizziness and giddiness: Secondary | ICD-10-CM | POA: Insufficient documentation

## 2022-05-21 DIAGNOSIS — K5900 Constipation, unspecified: Secondary | ICD-10-CM | POA: Diagnosis not present

## 2022-05-21 DIAGNOSIS — G4733 Obstructive sleep apnea (adult) (pediatric): Secondary | ICD-10-CM | POA: Diagnosis not present

## 2022-05-21 DIAGNOSIS — G47419 Narcolepsy without cataplexy: Secondary | ICD-10-CM | POA: Insufficient documentation

## 2022-05-21 DIAGNOSIS — D649 Anemia, unspecified: Secondary | ICD-10-CM | POA: Diagnosis not present

## 2022-05-21 DIAGNOSIS — I5042 Chronic combined systolic (congestive) and diastolic (congestive) heart failure: Secondary | ICD-10-CM | POA: Diagnosis not present

## 2022-05-21 DIAGNOSIS — Z7901 Long term (current) use of anticoagulants: Secondary | ICD-10-CM | POA: Insufficient documentation

## 2022-05-21 DIAGNOSIS — I5032 Chronic diastolic (congestive) heart failure: Secondary | ICD-10-CM | POA: Diagnosis not present

## 2022-05-21 DIAGNOSIS — H538 Other visual disturbances: Secondary | ICD-10-CM | POA: Diagnosis not present

## 2022-05-21 DIAGNOSIS — I110 Hypertensive heart disease with heart failure: Secondary | ICD-10-CM | POA: Diagnosis not present

## 2022-05-21 DIAGNOSIS — Z7984 Long term (current) use of oral hypoglycemic drugs: Secondary | ICD-10-CM | POA: Diagnosis not present

## 2022-05-21 DIAGNOSIS — J449 Chronic obstructive pulmonary disease, unspecified: Secondary | ICD-10-CM | POA: Diagnosis not present

## 2022-05-21 DIAGNOSIS — F419 Anxiety disorder, unspecified: Secondary | ICD-10-CM | POA: Diagnosis not present

## 2022-05-21 DIAGNOSIS — I4819 Other persistent atrial fibrillation: Secondary | ICD-10-CM | POA: Diagnosis not present

## 2022-05-21 DIAGNOSIS — E119 Type 2 diabetes mellitus without complications: Secondary | ICD-10-CM

## 2022-05-21 DIAGNOSIS — D508 Other iron deficiency anemias: Secondary | ICD-10-CM

## 2022-05-21 DIAGNOSIS — I10 Essential (primary) hypertension: Secondary | ICD-10-CM | POA: Diagnosis not present

## 2022-05-21 NOTE — Telephone Encounter (Signed)
Patient states he is not going to have the capsule study done. He states he does not think he needs it. Called endo and they said it has already been canceled

## 2022-05-21 NOTE — Telephone Encounter (Signed)
Spoke to [name] with [employer]. She stated that [employer] will not approve bipap currently, as he does not qualify based off of past note. Patient will need ABG and PFT.  Dr. Genia Harold, please advise.

## 2022-05-21 NOTE — Patient Instructions (Signed)
Continue weighing daily and call for an overnight weight gain of 3 pounds or more or a weekly weight gain of more than 5 pounds.   If you have voicemail, please make sure your mailbox is cleaned out so that we may leave a message and please make sure to listen to any voicemails.     

## 2022-05-21 NOTE — Telephone Encounter (Signed)
Pt is canceling capsule study on 05/24/2022 will call back and reschedule

## 2022-05-21 NOTE — Progress Notes (Signed)
Patient ID: [name], male    DOB: Jul 31, 1955, 67 y.o.   MRN: [id number]  HPI  [name] is a 67 y/o male with a history of atrial fibrillation, narcolepsy, CAD, DM, hyperlipidemia, HTN, anemia, anxiety, COPD, OSA, previous tobacco use and chronic heart failure.   Echo report from 11/19/21 reviewed and showed an EF of 60-65% along with mild LVH/ LAE.  Was in the ED 04/09/22 due to GIB. Was in the ED 02/23/22 due to SOB and lightheadedness and found to be anemic. Given 2 units of blood and released with improvement of symptoms and increase in hemoglobin.  Admitted 01/03/22 due to leg pain, shortness of breath and pedal edema. Placed on non-rebreather mask due to hypoxia. Initially given IV lasix with transition to oral diuretics. Given antibiotics for sepsis. Needed solu-medrol and then changed to oral prednisone. Weaned to oxygen via nasal cannula. Discharged after 2 days. Was in the ED 12/28/21 due to chest wall pain where he was evaluated and released.    He presents today for a follow-up visit with a chief complaint of minimal SOB with moderate exertion. Describes this as chronic in nature. Has associated fatigue, blurry vision (sees in a tunnel), morning cough, pedal edema (stable), head congestion, dizziness, anxiety & panic attacks and chronic difficulty sleeping along with this. Denies any abdominal distention, palpitations, chest pain or weight gain.   Says that he's been to eye doctor and that his exam was normal. Is thinking that his visual issues/ unbalance is due to sinuses or possibly related to neurological issue.   Past Medical History:  Diagnosis Date   Abnormal drug screen 2015   MJ positive x3 - one more and we will stop prescribing ativan (08/2013).   Anemia    Angina    Anxiety    Arrhythmia    atrial fibrillation   Arthritis    CAD (coronary artery disease)    nonobstructive   Cannabis abuse    Chronic systolic CHF (congestive heart failure) ([place]) 2013   NYHA  Class II/III   COPD (chronic obstructive pulmonary disease) ([place])    Diabetes type 2, uncontrolled    declines DSME   Dilated cardiomyopathy ([place]) 2013   now improved   Ex-smoker    Frequent headaches    History of atrial fibrillation 2013   chronic, s/p ablation prior on coumadin   Hyperlipidemia    Hypertension    Migraine    Narcolepsy    Nonischemic cardiomyopathy ([place]) 2013   EF 25% per Dr Nehemiah Massed   Obesity    OSA (obstructive sleep apnea)    does not use CPAP - unable to tolerate   Seasonal allergies    Past Surgical History:  Procedure Laterality Date   ATRIAL FLUTTER ABLATION N/A 09/21/2011   Procedure: ATRIAL FLUTTER ABLATION;  Surgeon: Thompson Grayer, MD;  Location: [facility] CATH LAB;  Service: Cardiovascular;  Laterality: N/A;   CARDIAC ELECTROPHYSIOLOGY MAPPING AND ABLATION  2013   for atrial flutter   CARDIOVASCULAR STRESS TEST  03/2012   ETT WNL Nehemiah Massed)   CARDIOVERSION N/A 08/12/2021   Procedure: CARDIOVERSION;  Surgeon: Corey Skains, MD;  Location: [facility] ORS;  Service: Cardiovascular;  Laterality: N/A;   COLONOSCOPY WITH PROPOFOL N/A 05/22/2020   TA, diverticulosis, descending colon ulcer biopsy WNL, int hem Allen Norris, Darren, MD)   COLONOSCOPY WITH PROPOFOL N/A 04/28/2022   multiple TAs - 10 small, 2 1.2cm, 2 76m, rpt 3 yrs (Vanga, RTally Due MD)  ESOPHAGOGASTRODUODENOSCOPY (EGD) WITH PROPOFOL N/A 05/22/2020   WNL (Wohl)   ESOPHAGOGASTRODUODENOSCOPY (EGD) WITH PROPOFOL N/A 04/28/2022   75m duodenal TA, reactive gastropathy, enteric mucosa in stomach biopsy of unclear significance (Vanga, RTally Due MD)   GIVENS CAPSULE STUDY  04/28/2022   Procedure: GIVENS CAPSULE STUDY;  Surgeon: VLin Landsman MD;  Location: [facility] ENDOSCOPY;  Service: Endoscopy;;   [place]  2014   EF 50%, nl LV fxn, RV nl size/function, mild mitral insuff   Family History  Problem Relation Age of Onset   Heart failure Father [phone]  Cancer Mother 636      NHL    Hypertension Maternal Grandfather    CAD Maternal Grandfather [phone]      MI   Diabetes Other        grandparents   Cancer Brother [phone]      lung (smoker)   Social History   Tobacco Use   Smoking status: Former    Packs/day: 1.00    Years: 31.00    Total pack years: 31.00    Types: Cigarettes    Quit date: 04/26/2018    Years since quitting: 4.0   Smokeless tobacco: Never  Substance Use Topics   Alcohol use: No    Alcohol/week: 0.0 standard drinks of alcohol   Allergies  Allergen Reactions   Atorvastatin Other (See Comments)    Dizziness   Lisinopril Cough   Prior to Admission medications   Medication Sig Start Date End Date Taking? Authorizing Provider  ACCU-CHEK FASTCLIX LANCETS MISC Check blood sugar once daily and as instructed. Dx 250.00 05/10/13  Yes GRia Bush MD  albuterol (VENTOLIN HFA) 108 (90 Base) MCG/ACT inhaler Inhale 2 puffs into the lungs every 6 (six) hours as needed for wheezing or shortness of breath. 01/12/22  Yes GRia Bush MD  amiodarone (PACERONE) 200 MG tablet Take 1 tablet (200 mg total) by mouth daily. 09/16/21  Yes LVickie Epley MD  Blood Glucose Monitoring Suppl (BLOOD GLUCOSE MONITOR SYSTEM) W/DEVICE KIT by Does not apply route. Use to check sugar once daily and as needed Dx: E11.9 **ONE TOUCH VERIO**   Yes [provider]  Budeson-Glycopyrrol-Formoterol (BREZTRI AEROSPHERE) 160-9-4.8 MCG/ACT AERO Inhale 2 puffs into the lungs 2 (two) times daily. 10/23/21  Yes GRia Bush MD  carvedilol (COREG) 6.25 MG tablet TAKE 1 TABLET BY MOUTH TWICE A DAY WITH FOOD 04/30/22  Yes GRia Bush MD  dapagliflozin propanediol (FARXIGA) 10 MG TABS tablet Take 1 tablet (10 mg total) by mouth daily before breakfast. 03/01/22  Yes HAlisa Graff FNP  Docusate Calcium (STOOL SOFTENER PO) Take 1 tablet by mouth daily.   Yes [provider]  FLUoxetine (PROZAC) 40 MG capsule Take 1 capsule (40 mg total) by mouth daily. 02/19/22   Yes GRia Bush MD  fluticasone ([facility] 50 MCG/ACT nasal spray Place 2 sprays into both nostrils daily. 01/12/22  Yes GRia Bush MD  furosemide (LASIX) 80 MG tablet Take 40 mg by mouth 2 (two) times daily. 12/25/21  Yes [provider]  glimepiride (AMARYL) 4 MG tablet Take 1 tablet (4 mg total) by mouth daily with breakfast. 01/14/22  Yes GRia Bush MD  glucose blood ([facility]) test strip Check blood sugar 3 times a day 10/22/21  Yes GRia Bush MD  ipratropium-albuterol (DUONEB) 0.5-2.5 (3) MG/3ML SOLN TAKE 3 MLS BY NEBULIZATION EVERY 6 (SIX) HOURS AS NEEDED (SEVERE SHORTNESS OF BREATH/WHEEZING). 04/30/22  Yes GRia Bush MD  metFORMIN (  GLUCOPHAGE) 1000 MG tablet Take 1 tablet (1,000 mg total) by mouth 2 (two) times daily with a meal. 01/14/22  Yes [name], MD  metolazone (ZAROXOLYN) 2.5 MG tablet Take 1 tablet (2.5 mg total) by mouth once a week. 04/03/22  Yes [name], MD  montelukast (SINGULAIR) 10 MG tablet TAKE 1 TABLET BY MOUTH EVERYDAY AT BEDTIME 07/07/21  Yes [name], MD  OVER THE COUNTER MEDICATION Take 1 tablet by mouth daily. Superbeets   Yes [provider]  OVER THE COUNTER MEDICATION Take 1 capsule by mouth daily at 6 (six) AM. [place] sea moss   Yes [provider]  pantoprazole (PROTONIX) 40 MG tablet TAKE 1 TABLET (40 MG TOTAL) BY MOUTH TWICE A DAY BEFORE MEALS 04/13/22  Yes [name], MD  potassium chloride (KLOR-CON M) 10 MEQ tablet Take 1 tablet (10 mEq total) by mouth daily. And an extra one on the days you take metolazone 02/25/22  Yes Darylene Price A, FNP  Probiotic Product (PROBIOTIC DAILY PO) Take 1 tablet by mouth daily.   Yes [provider]  Saw Palmetto 450 MG CAPS Take 1 capsule (450 mg total) by mouth daily. 03/29/22  Yes [name], MD  simvastatin (ZOCOR) 20 MG tablet TAKE 1 TABLET BY MOUTH EVERY DAY IN THE EVENING 05/18/22  Yes [name], MD   Turmeric (QC TUMERIC COMPLEX PO) Take by mouth.   Yes [provider]  vitamin B-12 (CYANOCOBALAMIN) 1000 MCG tablet Take 1 tablet (1,000 mcg total) by mouth every Monday, Wednesday, and Friday. Patient taking differently: Take 1,000 mcg by mouth daily. 07/22/21  Yes [name], MD  warfarin (COUMADIN) 5 MG tablet TAKE 1/2 TABLET BY MOUTH DAILY EXCEPT TAKE NO COUMADIN ON SUNDAY OR AS DIRECTED BY ANTICOAGULATION CLINIC Patient taking differently: Take 2.5 mg by mouth daily at 6 (six) AM. TAKE 1/2 TABLET BY MOUTH DAILY 03/26/22  Yes [name], MD  aspirin EC 81 MG tablet Take 1 tablet (81 mg total) by mouth daily. Swallow whole. Patient not taking: Reported on 05/21/2022 04/22/22   [name], MD  Cholecalciferol (VITAMIN D) 50 MCG (2000 UT) CAPS Take 1 capsule (2,000 Units total) by mouth daily. Patient not taking: Reported on 05/21/2022 02/05/19   [name], MD  Fe Fum-Fe Poly-Vit C-Lactobac (FUSION PO) Take by mouth. Patient states received sample.  Unsure of mg. Patient not taking: Reported on 05/21/2022    [provider]   Review of Systems  Constitutional:  Positive for fatigue. Negative for appetite change.  HENT:  Positive for congestion. Negative for postnasal drip and sore throat.   Eyes:  Positive for visual disturbance.  Respiratory:  Positive for cough (early morning) and shortness of breath (with moderate exertion). Negative for chest tightness.   Cardiovascular:  Positive for leg swelling (stable). Negative for chest pain and palpitations.  Gastrointestinal:  Positive for diarrhea (previous 2 days). Negative for abdominal distention, abdominal pain and blood in stool.  Endocrine: Negative.   Genitourinary:  Negative for hematuria.  Musculoskeletal:  Positive for neck pain. Negative for back pain.  Skin: Negative.   Allergic/Immunologic: Negative.   Neurological:  Positive for dizziness (with sudden position changes). Negative for  weakness and light-headedness.  Hematological:  Negative for adenopathy. Bruises/bleeds easily.  Psychiatric/Behavioral:  Positive for sleep disturbance (sleeping on 2-3 pillows; oxygen at 2L at home and bedtime). Negative for dysphoric mood. The patient is nervous/anxious.    Vitals:   05/21/22 1136  BP: 120/76  Pulse:  74  Resp: 20  SpO2: 95%  Weight: 237 lb 2 oz (107.6 kg)   Wt Readings from Last 3 Encounters:  05/21/22 237 lb 2 oz (107.6 kg)  05/03/22 239 lb 8 oz (108.6 kg)  04/28/22 237 lb (107.5 kg)   Lab Results  Component Value Date   CREATININE 1.15 05/03/2022   CREATININE 1.01 04/27/2022   CREATININE 1.18 04/20/2022   Physical Exam Vitals and nursing note reviewed.  Constitutional:      Appearance: He is well-developed.  HENT:     Head: Normocephalic and atraumatic.  Cardiovascular:     Rate and Rhythm: Normal rate and regular rhythm.  Pulmonary:     Effort: Pulmonary effort is normal.     Breath sounds: Examination of the right-upper field reveals wheezing. Examination of the left-upper field reveals wheezing. Examination of the right-lower field reveals wheezing. Examination of the left-lower field reveals wheezing. Wheezing present. No rhonchi or rales.  Abdominal:     Palpations: Abdomen is soft.     Tenderness: There is no abdominal tenderness.     Comments: Umbilical hernia present  Musculoskeletal:     Cervical back: Normal range of motion and neck supple.     Right lower leg: No tenderness. Edema (trace pitting) present.     Left lower leg: No tenderness. Edema (trace pitting) present.  Skin:    General: Skin is warm and dry.  Neurological:     General: No focal deficit present.     Mental Status: He is alert and oriented to person, place, and time.  Psychiatric:        Mood and Affect: Mood is anxious.        Behavior: Behavior normal.    Assessment & Plan:  1: Chronic heart failure with preserved ejection fraction with structural changes  (LVH/LAE)- - NYHA class II - euvolemic today - weighing daily; reminded to call for an overnight weight gain of > 2 pounds or a weekly weight gain of > 5 pounds - weight down 8 pounds from last visit here 5 weeks ago - not adding salt and is reading food labels for sodium content & is trying to lose weight by eating less and eating healthier - reviewed the importance of keeping daily fluid intake to 60 ounces / day; he's currently drinking 2 pots of coffee, 16 ounces diet tea and 20 ounces of snapple daily - saw cardiology Albertine Patricia) 04/23/22 - on GDMT of farxiga  - discussed entresto but concerned that he's currently feeling dizzy/ off balance with blurry vision; consider at future visits - may need neurology referral; he will discuss with PCP - BNP 01/03/22 was 289.4  2: HTN- - BP 120/76 - saw PCP Danise Mina) 05/03/22 - BMP 05/03/22 reviewed and showed sodium 143, potassium 4.0, creatinine 1.15 and GFR 66.26  3: DM- - A1c 04/20/22 was 6.9%  4: Atrial fibrillation- - saw EP Quentin Ore) 09/16/21 - taking amiodarone, carvedilol and warfarin  5: COPD- - wearing oxygen at 2L at bedtime and any time he naps - has CPAP but says that it's old and doesn't work right - saw pulmonology (Dgayli) 01/18/22  6: Anemia- - Hg 05/19/22 was 10.2 - reports oral iron makes him constipated - taking B12 3 times/ week - saw GI (Vanga) 04/08/22 - saw hematology Janese Banks) 04/23/22 - iron infusion done 05/13/22 - he cancelled upcoming capsule study    Medication list reviewed.   Return in 6 months, sooner if needed. He feels like he's  seeing too many providers and taking too many medications and would prefer pushing the appt out.

## 2022-05-22 DIAGNOSIS — E876 Hypokalemia: Secondary | ICD-10-CM | POA: Diagnosis not present

## 2022-05-22 DIAGNOSIS — M1990 Unspecified osteoarthritis, unspecified site: Secondary | ICD-10-CM | POA: Diagnosis not present

## 2022-05-22 DIAGNOSIS — I5042 Chronic combined systolic (congestive) and diastolic (congestive) heart failure: Secondary | ICD-10-CM | POA: Diagnosis not present

## 2022-05-22 DIAGNOSIS — Z7901 Long term (current) use of anticoagulants: Secondary | ICD-10-CM | POA: Diagnosis not present

## 2022-05-22 DIAGNOSIS — Z5181 Encounter for therapeutic drug level monitoring: Secondary | ICD-10-CM | POA: Diagnosis not present

## 2022-05-22 DIAGNOSIS — I420 Dilated cardiomyopathy: Secondary | ICD-10-CM | POA: Diagnosis not present

## 2022-05-22 DIAGNOSIS — I252 Old myocardial infarction: Secondary | ICD-10-CM | POA: Diagnosis not present

## 2022-05-22 DIAGNOSIS — J449 Chronic obstructive pulmonary disease, unspecified: Secondary | ICD-10-CM | POA: Diagnosis not present

## 2022-05-22 DIAGNOSIS — E1165 Type 2 diabetes mellitus with hyperglycemia: Secondary | ICD-10-CM | POA: Diagnosis not present

## 2022-05-22 DIAGNOSIS — Z9981 Dependence on supplemental oxygen: Secondary | ICD-10-CM | POA: Diagnosis not present

## 2022-05-22 DIAGNOSIS — Z7984 Long term (current) use of oral hypoglycemic drugs: Secondary | ICD-10-CM | POA: Diagnosis not present

## 2022-05-22 DIAGNOSIS — F32A Depression, unspecified: Secondary | ICD-10-CM | POA: Diagnosis not present

## 2022-05-22 DIAGNOSIS — E785 Hyperlipidemia, unspecified: Secondary | ICD-10-CM | POA: Diagnosis not present

## 2022-05-22 DIAGNOSIS — I872 Venous insufficiency (chronic) (peripheral): Secondary | ICD-10-CM | POA: Diagnosis not present

## 2022-05-22 DIAGNOSIS — Z9181 History of falling: Secondary | ICD-10-CM | POA: Diagnosis not present

## 2022-05-22 DIAGNOSIS — G4733 Obstructive sleep apnea (adult) (pediatric): Secondary | ICD-10-CM | POA: Diagnosis not present

## 2022-05-22 DIAGNOSIS — Z87891 Personal history of nicotine dependence: Secondary | ICD-10-CM | POA: Diagnosis not present

## 2022-05-22 DIAGNOSIS — I4891 Unspecified atrial fibrillation: Secondary | ICD-10-CM | POA: Diagnosis not present

## 2022-05-22 DIAGNOSIS — K219 Gastro-esophageal reflux disease without esophagitis: Secondary | ICD-10-CM | POA: Diagnosis not present

## 2022-05-22 DIAGNOSIS — I25119 Atherosclerotic heart disease of native coronary artery with unspecified angina pectoris: Secondary | ICD-10-CM | POA: Diagnosis not present

## 2022-05-22 DIAGNOSIS — G43909 Migraine, unspecified, not intractable, without status migrainosus: Secondary | ICD-10-CM | POA: Diagnosis not present

## 2022-05-22 DIAGNOSIS — I110 Hypertensive heart disease with heart failure: Secondary | ICD-10-CM | POA: Diagnosis not present

## 2022-05-23 NOTE — Progress Notes (Signed)
[facility] - PHARMACIST COUNSELING NOTE  Guideline-Directed Medical Therapy/Evidence Based Medicine  ACE/ARB/ARNI:  None Beta Blocker: Carvedilol 6.25 mg twice daily Aldosterone Antagonist:  None Diuretic: Furosemide 80 mg twice daily + Metolazone 2.5 mg once weekly SGLT2i: Dapagliflozin 10 mg daily  Adherence Assessment  Do you ever forget to take your medication? '[]'$ Yes '[x]'$ No  Do you ever skip doses due to side effects? '[]'$ Yes '[x]'$ No  Do you have trouble affording your medicines? '[x]'$ Yes '[]'$ No  Are you ever unable to pick up your medication due to transportation difficulties? '[]'$ Yes '[x]'$ No  Do you ever stop taking your medications because you don't believe they are helping? '[]'$ Yes '[x]'$ No  Do you check your weight daily? '[x]'$ Yes '[]'$ No   Adherence strategy: Does not use a pillbox  Barriers to obtaining medications: None  Vital signs: HR 74, BP 120/76, weight (pounds) 237 ECHO: Date 10/2021, EF 60-65%, notes: mild LVH and LAE     Latest Ref Rng & Units 05/03/2022    4:04 PM 04/27/2022    9:02 AM 04/20/2022   12:36 PM  BMP  Glucose 70 - 99 mg/dL 153  183  116   BUN 6 - 23 mg/dL '12  16  14   '$ Creatinine 0.40 - 1.50 mg/dL 1.15  1.01  1.18   Sodium 135 - 145 mEq/L 143  143  138   Potassium 3.5 - 5.1 mEq/L 4.0  4.1  4.0   Chloride 96 - 112 mEq/L 94  93  86   CO2 19 - 32 mEq/L 37  37  42   Calcium 8.4 - 10.5 mg/dL 9.2  9.7  10.1     Past Medical History:  Diagnosis Date   Abnormal drug screen 2015   MJ positive x3 - one more and we will stop prescribing ativan (08/2013).   Anemia    Angina    Anxiety    Arrhythmia    atrial fibrillation   Arthritis    CAD (coronary artery disease)    nonobstructive   Cannabis abuse    Chronic systolic CHF (congestive heart failure) ([place]) 2013   NYHA Class II/III   COPD (chronic obstructive pulmonary disease) ([place])    Diabetes type 2, uncontrolled    declines DSME   Dilated cardiomyopathy ([place]) 2013    now improved   Ex-smoker    Frequent headaches    History of atrial fibrillation 2013   chronic, s/p ablation prior on coumadin   Hyperlipidemia    Hypertension    Migraine    Narcolepsy    Nonischemic cardiomyopathy ([place]) 2013   EF 25% per Dr Nehemiah Massed   Obesity    OSA (obstructive sleep apnea)    does not use CPAP - unable to tolerate   Seasonal allergies     ASSESSMENT 67 year old male with PMH HTN, CAD, Afib, T2DM, restrictive lung disease who presents to the HF clinic for follow-up. Most recent ECHO in 10/2021 shows EF 60-65%. Regarding GDMT, patient takes Farxiga 10 mg daily and carvedilol 6.25 mg twice daily. Patient also takes furosemide 80 mg twice daily and metolazone 2.5 mg once weekly, along with potassium chloride 10 mEq daily and an extra potassium 10 mEq on the day he takes metolazone. Patient has no concerns or questions regarding his medication today however he was recently in the ED for a GI bleed, and is frustrated at the inability to find a source of bleed. Scheduled for a repeat capsule study  but he already had one that didn't find anything, and patient is frustrated at paying for a second one. Regarding Afib, patient has been switched to warfarin due to inability to afford DOAC once he enters donut hole.  Recent ED Visit (past 6 months): Date - 03/2022, CC - GI bleed Date - 01/2022, CC - Anemia Date - 01/03/2022, CC - Respiratory failure Date - 12/28/2021, CC - Chest pain, falls Date - 10/2021, CC - Fall with forehead contusion  PLAN CHF/HTN Continue Farxiga, carvedilol, furosemide, metolazone, and potassium Consider losartan 12.5 mg daily at future visit however recent history of falls and forehead contusions on warfarin give pause about over-aggressive BP management Counseled patient that if he falls and hits his head, please come to ED to r/o hemorrhage  Afib CHADSVASc 5 Continue warfarin and amiodarone  T2DM 03/2022 A1c 6.9% Continue metformin and  Farxiga  HLD/CAD 12/2021 LDL 50 Continue simvastatin   Time spent: 15 minutes  Will M. Ouida Sills, PharmD PGY-1 Pharmacy Resident 05/23/2022 2:03 PM   Current Outpatient Medications:    ACCU-CHEK FASTCLIX LANCETS MISC, Check blood sugar once daily and as instructed. Dx 250.00, Disp: 100 each, Rfl: 3   albuterol (VENTOLIN HFA) 108 (90 Base) MCG/ACT inhaler, Inhale 2 puffs into the lungs every 6 (six) hours as needed for wheezing or shortness of breath., Disp: 8.5 each, Rfl: 6   amiodarone (PACERONE) 200 MG tablet, Take 1 tablet (200 mg total) by mouth daily., Disp: 90 tablet, Rfl: 3   aspirin EC 81 MG tablet, Take 1 tablet (81 mg total) by mouth daily. Swallow whole. (Patient not taking: Reported on 05/21/2022), Disp: 30 tablet, Rfl: 12   Blood Glucose Monitoring Suppl (BLOOD GLUCOSE MONITOR SYSTEM) W/DEVICE KIT, by Does not apply route. Use to check sugar once daily and as needed Dx: E11.9 **ONE TOUCH VERIO**, Disp: , Rfl:    Budeson-Glycopyrrol-Formoterol (BREZTRI AEROSPHERE) 160-9-4.8 MCG/ACT AERO, Inhale 2 puffs into the lungs 2 (two) times daily., Disp: 32.1 g, Rfl: 3   carvedilol (COREG) 6.25 MG tablet, TAKE 1 TABLET BY MOUTH TWICE A DAY WITH FOOD, Disp: 180 tablet, Rfl: 0   Cholecalciferol (VITAMIN D) 50 MCG (2000 UT) CAPS, Take 1 capsule (2,000 Units total) by mouth daily. (Patient not taking: Reported on 05/21/2022), Disp: 30 capsule, Rfl:    dapagliflozin propanediol (FARXIGA) 10 MG TABS tablet, Take 1 tablet (10 mg total) by mouth daily before breakfast., Disp: 90 tablet, Rfl: 3   Docusate Calcium (STOOL SOFTENER PO), Take 1 tablet by mouth daily., Disp: , Rfl:    Fe Fum-Fe Poly-Vit C-Lactobac (FUSION PO), Take by mouth. Patient states received sample.  Unsure of mg. (Patient not taking: Reported on 05/21/2022), Disp: , Rfl:    FLUoxetine (PROZAC) 40 MG capsule, Take 1 capsule (40 mg total) by mouth daily., Disp: 30 capsule, Rfl: 6   fluticasone (FLONASE) 50 MCG/ACT nasal spray, Place 2  sprays into both nostrils daily., Disp: 48 mL, Rfl: 3   furosemide (LASIX) 80 MG tablet, Take 40 mg by mouth 2 (two) times daily., Disp: , Rfl:    glimepiride (AMARYL) 4 MG tablet, Take 1 tablet (4 mg total) by mouth daily with breakfast., Disp: 90 tablet, Rfl: 1   glucose blood (ONETOUCH VERIO) test strip, Check blood sugar 3 times a day, Disp: 300 strip, Rfl: 3   ipratropium-albuterol (DUONEB) 0.5-2.5 (3) MG/3ML SOLN, TAKE 3 MLS BY NEBULIZATION EVERY 6 (SIX) HOURS AS NEEDED (SEVERE SHORTNESS OF BREATH/WHEEZING)., Disp: 360 mL, Rfl: 0  metFORMIN (GLUCOPHAGE) 1000 MG tablet, Take 1 tablet (1,000 mg total) by mouth 2 (two) times daily with a meal., Disp: 180 tablet, Rfl: 1   metolazone (ZAROXOLYN) 2.5 MG tablet, Take 1 tablet (2.5 mg total) by mouth once a week., Disp: , Rfl:    montelukast (SINGULAIR) 10 MG tablet, TAKE 1 TABLET BY MOUTH EVERYDAY AT BEDTIME, Disp: 90 tablet, Rfl: 3   OVER THE COUNTER MEDICATION, Take 1 tablet by mouth daily. Superbeets, Disp: , Rfl:    OVER THE COUNTER MEDICATION, Take 1 capsule by mouth daily at 6 (six) AM. [place] sea moss, Disp: , Rfl:    pantoprazole (PROTONIX) 40 MG tablet, TAKE 1 TABLET (40 MG TOTAL) BY MOUTH TWICE A DAY BEFORE MEALS, Disp: 180 tablet, Rfl: 0   potassium chloride (KLOR-CON M) 10 MEQ tablet, Take 1 tablet (10 mEq total) by mouth daily. And an extra one on the days you take metolazone, Disp: 38 tablet, Rfl: 5   Probiotic Product (PROBIOTIC DAILY PO), Take 1 tablet by mouth daily., Disp: , Rfl:    Saw Palmetto 450 MG CAPS, Take 1 capsule (450 mg total) by mouth daily., Disp: , Rfl: 0   simvastatin (ZOCOR) 20 MG tablet, TAKE 1 TABLET BY MOUTH EVERY DAY IN THE EVENING, Disp: 90 tablet, Rfl: 1   Turmeric (QC TUMERIC COMPLEX PO), Take by mouth., Disp: , Rfl:    vitamin B-12 (CYANOCOBALAMIN) 1000 MCG tablet, Take 1 tablet (1,000 mcg total) by mouth every Monday, Wednesday, and Friday. (Patient taking differently: Take 1,000 mcg by mouth daily.), Disp: ,  Rfl:    warfarin (COUMADIN) 5 MG tablet, TAKE 1/2 TABLET BY MOUTH DAILY EXCEPT TAKE NO COUMADIN ON SUNDAY OR AS DIRECTED BY ANTICOAGULATION CLINIC (Patient taking differently: Take 2.5 mg by mouth daily at 6 (six) AM. TAKE 1/2 TABLET BY MOUTH DAILY), Disp: 60 tablet, Rfl: 1   DRUGS TO CAUTION IN HEART FAILURE  Drug or Class Mechanism  Analgesics NSAIDs COX-2 inhibitors Glucocorticoids  Sodium and water retention, increased systemic vascular resistance, decreased response to diuretics   Diabetes Medications Metformin Thiazolidinediones Rosiglitazone (Avandia) Pioglitazone (Actos) DPP4 Inhibitors Saxagliptin (Onglyza) Sitagliptin (Januvia)   Lactic acidosis Possible calcium channel blockade   Unknown  Antiarrhythmics Class I  Flecainide Disopyramide Class III Sotalol Other Dronedarone  Negative inotrope, proarrhythmic   Proarrhythmic, beta blockade  Negative inotrope  Antihypertensives Alpha Blockers Doxazosin Calcium Channel Blockers Diltiazem Verapamil Nifedipine Central Alpha Adrenergics Moxonidine Peripheral Vasodilators Minoxidil  Increases renin and aldosterone  Negative inotrope    Possible sympathetic withdrawal  Unknown  Anti-infective Itraconazole Amphotericin B  Negative inotrope Unknown  Hematologic Anagrelide Cilostazol   Possible inhibition of PD IV Inhibition of PD III causing arrhythmias  Neurologic/Psychiatric Stimulants Anti-Seizure Drugs Carbamazepine Pregabalin Antidepressants Tricyclics Citalopram Parkinsons Bromocriptine Pergolide Pramipexole Antipsychotics Clozapine Antimigraine Ergotamine Methysergide Appetite suppressants Bipolar Lithium  Peripheral alpha and beta agonist activity  Negative inotrope and chronotrope Calcium channel blockade  Negative inotrope, proarrhythmic Dose-dependent QT prolongation  Excessive serotonin activity/valvular damage Excessive serotonin activity/valvular  damage Unknown  IgE mediated hypersensitivy, calcium channel blockade  Excessive serotonin activity/valvular damage Excessive serotonin activity/valvular damage Valvular damage  Direct myofibrillar degeneration, adrenergic stimulation  Antimalarials Chloroquine Hydroxychloroquine Intracellular inhibition of lysosomal enzymes  Urologic Agents Alpha Blockers Doxazosin Prazosin Tamsulosin Terazosin  Increased renin and aldosterone  Adapted from Page Carleene Overlie, et al. "Drugs That May Cause or Exacerbate Heart Failure: A Scientific Statement from the American Heart  Association." Circulation 2016; 134:e32-e69. DOI: 10.1161/CIR.0000000000000426   MEDICATION  ADHERENCES TIPS AND STRATEGIES Taking medication as prescribed improves patient outcomes in heart failure (reduces hospitalizations, improves symptoms, increases survival) Side effects of medications can be managed by decreasing doses, switching agents, stopping drugs, or adding additional therapy. Please let someone in the [facility] know if you have having bothersome side effects so we can modify your regimen. Do not alter your medication regimen without talking to [place].  Medication reminders can help patients remember to take drugs on time. If you are missing or forgetting doses you can try linking behaviors, using pill boxes, or an electronic reminder like an alarm on your phone or an app. Some people can also get automated phone calls as medication reminders.

## 2022-05-24 ENCOUNTER — Ambulatory Visit: Admission: RE | Admit: 2022-05-24 | Payer: Medicare Other | Source: Home / Self Care | Admitting: Gastroenterology

## 2022-05-24 ENCOUNTER — Encounter: Admission: RE | Payer: Self-pay | Source: Home / Self Care

## 2022-05-24 SURGERY — IMAGING PROCEDURE, GI TRACT, INTRALUMINAL, VIA CAPSULE

## 2022-05-25 ENCOUNTER — Ambulatory Visit: Payer: Self-pay

## 2022-05-25 NOTE — Patient Outreach (Signed)
  Care Coordination   Follow Up Visit Note   05/25/2022 Name: SYNCERE EBLE MRN: [id number] DOB: 1955/05/18  [name] Flaum is a 67 y.o. year old male who sees [name], MD for primary care. I spoke with  [name] Mish by phone today.  What matters to the patients health and wellness today?  Balance and equilibrium issues    Goals Addressed             This Visit's Progress    Patient Stated:  Managing balance/ equilibrium issue       Care Coordination Interventions: Evaluation of current treatment plan related to balance/ equilibrium issues and patient's adherence to plan as established by provider Reviewed medications with patient and discussed that certain medications can cause dizziness/ lightheadedness Reviewed scheduled/upcoming provider appointments  Discussed plans with patient for ongoing care management follow up and provided patient with direct contact information for care management team:  Patient verbally agreed to next telephone outreach call with RNCM for 07/01/22.  Assessed for recent falls: Patient denies having any falls over the last 2-3 months Discussed fall precautions and mailed patient education article regarding fall precautions.      COMPLETED: Patient states:  Care coordination follow up for durable medical equipment/ community resources       Care Coordination Interventions: Evaluation of current treatment plan related to care coordination activities and patient's adherence to plan as established by provide: Patient reports he has the information he needs regarding transportation.   Reviewed scheduled/upcoming provider appointments:            SDOH assessments and interventions completed:  No     Care Coordination Interventions:  Yes, provided   Follow up plan: Follow up call scheduled for 07/01/22    Encounter Outcome:  Pt. Visit Completed   Quinn Plowman RN,BSN,CCM [place] [phone] direct line

## 2022-05-26 ENCOUNTER — Other Ambulatory Visit: Payer: Self-pay | Admitting: Family Medicine

## 2022-05-26 NOTE — Telephone Encounter (Signed)
Anita, please advise. Thanks 

## 2022-05-26 NOTE — Telephone Encounter (Signed)
Patient has been scheduled to do in lab sleep study on 03/29/22, 04/07/22, 04/28/2022, 05/11/2022 he kept rescheduling and the last appt he just called and CXL. His PFT was scheduled on 03/23/22 and he no showed for the appt.  I don't see an order for ABG

## 2022-05-26 NOTE — Telephone Encounter (Signed)
Dr. Genia Harold, please see Anita's response. Would you like to order ABG as well?

## 2022-05-26 NOTE — Telephone Encounter (Signed)
Dr. Genia Harold, please advise. Thanks

## 2022-05-28 ENCOUNTER — Ambulatory Visit (INDEPENDENT_AMBULATORY_CARE_PROVIDER_SITE_OTHER): Payer: Medicare Other

## 2022-05-28 DIAGNOSIS — I110 Hypertensive heart disease with heart failure: Secondary | ICD-10-CM | POA: Diagnosis not present

## 2022-05-28 DIAGNOSIS — Z7901 Long term (current) use of anticoagulants: Secondary | ICD-10-CM

## 2022-05-28 DIAGNOSIS — I25119 Atherosclerotic heart disease of native coronary artery with unspecified angina pectoris: Secondary | ICD-10-CM | POA: Diagnosis not present

## 2022-05-28 DIAGNOSIS — K219 Gastro-esophageal reflux disease without esophagitis: Secondary | ICD-10-CM | POA: Diagnosis not present

## 2022-05-28 DIAGNOSIS — Z7984 Long term (current) use of oral hypoglycemic drugs: Secondary | ICD-10-CM | POA: Diagnosis not present

## 2022-05-28 DIAGNOSIS — E785 Hyperlipidemia, unspecified: Secondary | ICD-10-CM | POA: Diagnosis not present

## 2022-05-28 DIAGNOSIS — E1165 Type 2 diabetes mellitus with hyperglycemia: Secondary | ICD-10-CM | POA: Diagnosis not present

## 2022-05-28 DIAGNOSIS — J449 Chronic obstructive pulmonary disease, unspecified: Secondary | ICD-10-CM | POA: Diagnosis not present

## 2022-05-28 DIAGNOSIS — G4733 Obstructive sleep apnea (adult) (pediatric): Secondary | ICD-10-CM | POA: Diagnosis not present

## 2022-05-28 DIAGNOSIS — E876 Hypokalemia: Secondary | ICD-10-CM | POA: Diagnosis not present

## 2022-05-28 DIAGNOSIS — I5042 Chronic combined systolic (congestive) and diastolic (congestive) heart failure: Secondary | ICD-10-CM | POA: Diagnosis not present

## 2022-05-28 DIAGNOSIS — I420 Dilated cardiomyopathy: Secondary | ICD-10-CM | POA: Diagnosis not present

## 2022-05-28 DIAGNOSIS — M1990 Unspecified osteoarthritis, unspecified site: Secondary | ICD-10-CM | POA: Diagnosis not present

## 2022-05-28 DIAGNOSIS — Z9981 Dependence on supplemental oxygen: Secondary | ICD-10-CM | POA: Diagnosis not present

## 2022-05-28 DIAGNOSIS — Z5181 Encounter for therapeutic drug level monitoring: Secondary | ICD-10-CM | POA: Diagnosis not present

## 2022-05-28 DIAGNOSIS — Z9181 History of falling: Secondary | ICD-10-CM | POA: Diagnosis not present

## 2022-05-28 DIAGNOSIS — F32A Depression, unspecified: Secondary | ICD-10-CM | POA: Diagnosis not present

## 2022-05-28 DIAGNOSIS — I4891 Unspecified atrial fibrillation: Secondary | ICD-10-CM | POA: Diagnosis not present

## 2022-05-28 DIAGNOSIS — Z87891 Personal history of nicotine dependence: Secondary | ICD-10-CM | POA: Diagnosis not present

## 2022-05-28 DIAGNOSIS — I872 Venous insufficiency (chronic) (peripheral): Secondary | ICD-10-CM | POA: Diagnosis not present

## 2022-05-28 DIAGNOSIS — G43909 Migraine, unspecified, not intractable, without status migrainosus: Secondary | ICD-10-CM | POA: Diagnosis not present

## 2022-05-28 DIAGNOSIS — I252 Old myocardial infarction: Secondary | ICD-10-CM | POA: Diagnosis not present

## 2022-05-28 LAB — POCT INR: INR: 1.7 — AB (ref 2.0–3.0)

## 2022-05-28 NOTE — Telephone Encounter (Signed)
Lm for patient to make him aware. PFT and ABG will need to be separate orders. thanks

## 2022-05-28 NOTE — Patient Instructions (Signed)
Pre visit review using our clinic review tool, if applicable. No additional management support is needed unless otherwise documented below in the visit note. 

## 2022-05-28 NOTE — Telephone Encounter (Addendum)
Patient is aware of need for PFT and ABG. He agrees with plan. Nothing further needed.

## 2022-05-28 NOTE — Progress Notes (Signed)
[name] from [facility], [phone], called with INR result of 1.7 Pt has made the change to take Fusion iron supplement in the morning and warfarin in the evening. Increase dose today to take 1 tablet and then change weekly dose to take 1/2 tablet daily except take 1 tablet on Fridays. Recheck INR in one week.  Pt currently denies any bleeding. Advised if any s/s of abnormal bruising or bleeding to contact office or go to the ER. Instructions given to [name] and pt, both verbalize understanding. [name] is texting the instructions to the pt so he has them in writing.

## 2022-05-28 NOTE — Addendum Note (Signed)
Addended by: Claudette Head A on: 05/28/2022 04:54 PM   Modules accepted: Orders

## 2022-05-30 ENCOUNTER — Other Ambulatory Visit (HOSPITAL_BASED_OUTPATIENT_CLINIC_OR_DEPARTMENT_OTHER): Payer: Self-pay | Admitting: Family Medicine

## 2022-05-30 DIAGNOSIS — J432 Centrilobular emphysema: Secondary | ICD-10-CM

## 2022-05-31 DIAGNOSIS — H40003 Preglaucoma, unspecified, bilateral: Secondary | ICD-10-CM | POA: Diagnosis not present

## 2022-06-01 DIAGNOSIS — G43909 Migraine, unspecified, not intractable, without status migrainosus: Secondary | ICD-10-CM | POA: Diagnosis not present

## 2022-06-01 DIAGNOSIS — I420 Dilated cardiomyopathy: Secondary | ICD-10-CM | POA: Diagnosis not present

## 2022-06-01 DIAGNOSIS — I4891 Unspecified atrial fibrillation: Secondary | ICD-10-CM | POA: Diagnosis not present

## 2022-06-01 DIAGNOSIS — G4733 Obstructive sleep apnea (adult) (pediatric): Secondary | ICD-10-CM | POA: Diagnosis not present

## 2022-06-01 DIAGNOSIS — Z7984 Long term (current) use of oral hypoglycemic drugs: Secondary | ICD-10-CM | POA: Diagnosis not present

## 2022-06-01 DIAGNOSIS — K219 Gastro-esophageal reflux disease without esophagitis: Secondary | ICD-10-CM | POA: Diagnosis not present

## 2022-06-01 DIAGNOSIS — Z5181 Encounter for therapeutic drug level monitoring: Secondary | ICD-10-CM | POA: Diagnosis not present

## 2022-06-01 DIAGNOSIS — I110 Hypertensive heart disease with heart failure: Secondary | ICD-10-CM | POA: Diagnosis not present

## 2022-06-01 DIAGNOSIS — Z9981 Dependence on supplemental oxygen: Secondary | ICD-10-CM | POA: Diagnosis not present

## 2022-06-01 DIAGNOSIS — Z9181 History of falling: Secondary | ICD-10-CM | POA: Diagnosis not present

## 2022-06-01 DIAGNOSIS — E1165 Type 2 diabetes mellitus with hyperglycemia: Secondary | ICD-10-CM | POA: Diagnosis not present

## 2022-06-01 DIAGNOSIS — I25119 Atherosclerotic heart disease of native coronary artery with unspecified angina pectoris: Secondary | ICD-10-CM | POA: Diagnosis not present

## 2022-06-01 DIAGNOSIS — I252 Old myocardial infarction: Secondary | ICD-10-CM | POA: Diagnosis not present

## 2022-06-01 DIAGNOSIS — Z87891 Personal history of nicotine dependence: Secondary | ICD-10-CM | POA: Diagnosis not present

## 2022-06-01 DIAGNOSIS — F32A Depression, unspecified: Secondary | ICD-10-CM | POA: Diagnosis not present

## 2022-06-01 DIAGNOSIS — Z7901 Long term (current) use of anticoagulants: Secondary | ICD-10-CM | POA: Diagnosis not present

## 2022-06-01 DIAGNOSIS — M1990 Unspecified osteoarthritis, unspecified site: Secondary | ICD-10-CM | POA: Diagnosis not present

## 2022-06-01 DIAGNOSIS — I872 Venous insufficiency (chronic) (peripheral): Secondary | ICD-10-CM | POA: Diagnosis not present

## 2022-06-01 DIAGNOSIS — E785 Hyperlipidemia, unspecified: Secondary | ICD-10-CM | POA: Diagnosis not present

## 2022-06-01 DIAGNOSIS — J449 Chronic obstructive pulmonary disease, unspecified: Secondary | ICD-10-CM | POA: Diagnosis not present

## 2022-06-01 DIAGNOSIS — I5042 Chronic combined systolic (congestive) and diastolic (congestive) heart failure: Secondary | ICD-10-CM | POA: Diagnosis not present

## 2022-06-01 DIAGNOSIS — E876 Hypokalemia: Secondary | ICD-10-CM | POA: Diagnosis not present

## 2022-06-04 ENCOUNTER — Ambulatory Visit (INDEPENDENT_AMBULATORY_CARE_PROVIDER_SITE_OTHER): Payer: Medicare Other

## 2022-06-04 DIAGNOSIS — M1990 Unspecified osteoarthritis, unspecified site: Secondary | ICD-10-CM | POA: Diagnosis not present

## 2022-06-04 DIAGNOSIS — E785 Hyperlipidemia, unspecified: Secondary | ICD-10-CM | POA: Diagnosis not present

## 2022-06-04 DIAGNOSIS — K219 Gastro-esophageal reflux disease without esophagitis: Secondary | ICD-10-CM | POA: Diagnosis not present

## 2022-06-04 DIAGNOSIS — G4733 Obstructive sleep apnea (adult) (pediatric): Secondary | ICD-10-CM | POA: Diagnosis not present

## 2022-06-04 DIAGNOSIS — I5042 Chronic combined systolic (congestive) and diastolic (congestive) heart failure: Secondary | ICD-10-CM | POA: Diagnosis not present

## 2022-06-04 DIAGNOSIS — I252 Old myocardial infarction: Secondary | ICD-10-CM | POA: Diagnosis not present

## 2022-06-04 DIAGNOSIS — Z7901 Long term (current) use of anticoagulants: Secondary | ICD-10-CM | POA: Diagnosis not present

## 2022-06-04 DIAGNOSIS — E1165 Type 2 diabetes mellitus with hyperglycemia: Secondary | ICD-10-CM | POA: Diagnosis not present

## 2022-06-04 DIAGNOSIS — I872 Venous insufficiency (chronic) (peripheral): Secondary | ICD-10-CM | POA: Diagnosis not present

## 2022-06-04 DIAGNOSIS — Z9181 History of falling: Secondary | ICD-10-CM | POA: Diagnosis not present

## 2022-06-04 DIAGNOSIS — E876 Hypokalemia: Secondary | ICD-10-CM | POA: Diagnosis not present

## 2022-06-04 DIAGNOSIS — I4891 Unspecified atrial fibrillation: Secondary | ICD-10-CM | POA: Diagnosis not present

## 2022-06-04 DIAGNOSIS — I420 Dilated cardiomyopathy: Secondary | ICD-10-CM | POA: Diagnosis not present

## 2022-06-04 DIAGNOSIS — I110 Hypertensive heart disease with heart failure: Secondary | ICD-10-CM | POA: Diagnosis not present

## 2022-06-04 DIAGNOSIS — G43909 Migraine, unspecified, not intractable, without status migrainosus: Secondary | ICD-10-CM | POA: Diagnosis not present

## 2022-06-04 DIAGNOSIS — Z87891 Personal history of nicotine dependence: Secondary | ICD-10-CM | POA: Diagnosis not present

## 2022-06-04 DIAGNOSIS — I25119 Atherosclerotic heart disease of native coronary artery with unspecified angina pectoris: Secondary | ICD-10-CM | POA: Diagnosis not present

## 2022-06-04 DIAGNOSIS — J449 Chronic obstructive pulmonary disease, unspecified: Secondary | ICD-10-CM | POA: Diagnosis not present

## 2022-06-04 DIAGNOSIS — Z7984 Long term (current) use of oral hypoglycemic drugs: Secondary | ICD-10-CM | POA: Diagnosis not present

## 2022-06-04 DIAGNOSIS — Z5181 Encounter for therapeutic drug level monitoring: Secondary | ICD-10-CM | POA: Diagnosis not present

## 2022-06-04 DIAGNOSIS — Z9981 Dependence on supplemental oxygen: Secondary | ICD-10-CM | POA: Diagnosis not present

## 2022-06-04 DIAGNOSIS — F32A Depression, unspecified: Secondary | ICD-10-CM | POA: Diagnosis not present

## 2022-06-04 LAB — POCT INR: INR: 1.3 — AB (ref 2.0–3.0)

## 2022-06-04 NOTE — Patient Instructions (Addendum)
Pre visit review using our clinic review tool, if applicable. No additional management support is needed unless otherwise documented below in the visit note.  Increase dose today to take 1 tablet and then change weekly dose to take 1/2 tablet daily except take 1 tablet on Mondays, Wednesdays and Fridays. Recheck INR in one week.

## 2022-06-04 NOTE — Progress Notes (Signed)
[name] from [facility], [phone], called with INR result of 1.3 Pt denies any changes this week.  Increase dose today to take 1 tablet and then change weekly dose to take 1/2 tablet daily except take 1 tablet on Mondays, Wednesdays and Fridays. Recheck INR in one week.  Pt currently denies any bleeding. Advised if any s/s of abnormal bruising or bleeding to contact office or go to the ER. Instructions given to [name] and pt, both verbalize understanding. Advised [name] and pt while on the phone of new dosing. Both verbalized understanding.

## 2022-06-07 DIAGNOSIS — H401123 Primary open-angle glaucoma, left eye, severe stage: Secondary | ICD-10-CM | POA: Diagnosis not present

## 2022-06-07 DIAGNOSIS — H401111 Primary open-angle glaucoma, right eye, mild stage: Secondary | ICD-10-CM | POA: Diagnosis not present

## 2022-06-07 DIAGNOSIS — H2513 Age-related nuclear cataract, bilateral: Secondary | ICD-10-CM | POA: Diagnosis not present

## 2022-06-11 ENCOUNTER — Telehealth: Payer: Self-pay

## 2022-06-11 ENCOUNTER — Ambulatory Visit (INDEPENDENT_AMBULATORY_CARE_PROVIDER_SITE_OTHER): Payer: Medicare Other

## 2022-06-11 DIAGNOSIS — M1990 Unspecified osteoarthritis, unspecified site: Secondary | ICD-10-CM | POA: Diagnosis not present

## 2022-06-11 DIAGNOSIS — Z5181 Encounter for therapeutic drug level monitoring: Secondary | ICD-10-CM | POA: Diagnosis not present

## 2022-06-11 DIAGNOSIS — Z9981 Dependence on supplemental oxygen: Secondary | ICD-10-CM | POA: Diagnosis not present

## 2022-06-11 DIAGNOSIS — E1165 Type 2 diabetes mellitus with hyperglycemia: Secondary | ICD-10-CM | POA: Diagnosis not present

## 2022-06-11 DIAGNOSIS — G43909 Migraine, unspecified, not intractable, without status migrainosus: Secondary | ICD-10-CM | POA: Diagnosis not present

## 2022-06-11 DIAGNOSIS — Z9181 History of falling: Secondary | ICD-10-CM | POA: Diagnosis not present

## 2022-06-11 DIAGNOSIS — E876 Hypokalemia: Secondary | ICD-10-CM | POA: Diagnosis not present

## 2022-06-11 DIAGNOSIS — Z7901 Long term (current) use of anticoagulants: Secondary | ICD-10-CM

## 2022-06-11 DIAGNOSIS — I25119 Atherosclerotic heart disease of native coronary artery with unspecified angina pectoris: Secondary | ICD-10-CM | POA: Diagnosis not present

## 2022-06-11 DIAGNOSIS — I5042 Chronic combined systolic (congestive) and diastolic (congestive) heart failure: Secondary | ICD-10-CM | POA: Diagnosis not present

## 2022-06-11 DIAGNOSIS — Z87891 Personal history of nicotine dependence: Secondary | ICD-10-CM | POA: Diagnosis not present

## 2022-06-11 DIAGNOSIS — I4891 Unspecified atrial fibrillation: Secondary | ICD-10-CM | POA: Diagnosis not present

## 2022-06-11 DIAGNOSIS — G4733 Obstructive sleep apnea (adult) (pediatric): Secondary | ICD-10-CM | POA: Diagnosis not present

## 2022-06-11 DIAGNOSIS — J449 Chronic obstructive pulmonary disease, unspecified: Secondary | ICD-10-CM | POA: Diagnosis not present

## 2022-06-11 DIAGNOSIS — Z7984 Long term (current) use of oral hypoglycemic drugs: Secondary | ICD-10-CM | POA: Diagnosis not present

## 2022-06-11 DIAGNOSIS — I110 Hypertensive heart disease with heart failure: Secondary | ICD-10-CM | POA: Diagnosis not present

## 2022-06-11 DIAGNOSIS — I872 Venous insufficiency (chronic) (peripheral): Secondary | ICD-10-CM | POA: Diagnosis not present

## 2022-06-11 DIAGNOSIS — F32A Depression, unspecified: Secondary | ICD-10-CM | POA: Diagnosis not present

## 2022-06-11 DIAGNOSIS — I420 Dilated cardiomyopathy: Secondary | ICD-10-CM | POA: Diagnosis not present

## 2022-06-11 DIAGNOSIS — E785 Hyperlipidemia, unspecified: Secondary | ICD-10-CM | POA: Diagnosis not present

## 2022-06-11 DIAGNOSIS — K219 Gastro-esophageal reflux disease without esophagitis: Secondary | ICD-10-CM | POA: Diagnosis not present

## 2022-06-11 DIAGNOSIS — I252 Old myocardial infarction: Secondary | ICD-10-CM | POA: Diagnosis not present

## 2022-06-11 LAB — POCT INR: INR: 1.3 — AB (ref 2.0–3.0)

## 2022-06-11 NOTE — Telephone Encounter (Signed)
Pt has been contacted and advised of dosing instructions.

## 2022-06-11 NOTE — Progress Notes (Addendum)
[name] from [facility], [phone], called with INR result of 1.3  Pt reports he did not follow instructions for dosing. He reports he was confused and messed it up. Pt reports he only changed booster dosing for the first two days after instructions and did not change the other two days to full tablets. So, he ended up taking the same dose as he did the week before and this is why there is no change in INR.   Pt will return to dosing given last week. Increase dose today to take 1 1/2 tablets and increase dose tomorrow to take 1 tablet and then change weekly dose to take 1/2 tablet daily except take 1 tablet on Monday, Wednesday and Friday. Recheck in 1 week. Pt readback dosing instructions while on the phone and did not have any further questions.  Pt currently denies any bleeding. Advised if any s/s of abnormal bruising or bleeding to contact office or go to the ER.   Contacted [name] and advised of dosing and recheck in 1 week. [name] verbalized understanding.

## 2022-06-11 NOTE — Patient Instructions (Addendum)
Pre visit review using our clinic review tool, if applicable. No additional management support is needed unless otherwise documented below in the visit note.  Increase dose today to take 1 1/2 tablets today and then change weekly dose to take 1 tablet daily except take 1/2 tablet on Tuesdays. Recheck INR in one week.

## 2022-06-11 NOTE — Telephone Encounter (Signed)
Pt states he was returning a call from you that he had missed and asked if you can give him  call back.

## 2022-06-14 ENCOUNTER — Encounter: Payer: Self-pay | Admitting: Family Medicine

## 2022-06-14 ENCOUNTER — Ambulatory Visit (INDEPENDENT_AMBULATORY_CARE_PROVIDER_SITE_OTHER): Payer: Medicare Other | Admitting: Family Medicine

## 2022-06-14 VITALS — BP 134/70 | HR 72 | Temp 97.4°F | Ht 70.75 in | Wt 228.1 lb

## 2022-06-14 DIAGNOSIS — Z87891 Personal history of nicotine dependence: Secondary | ICD-10-CM | POA: Diagnosis not present

## 2022-06-14 DIAGNOSIS — J9612 Chronic respiratory failure with hypercapnia: Secondary | ICD-10-CM

## 2022-06-14 DIAGNOSIS — Z7901 Long term (current) use of anticoagulants: Secondary | ICD-10-CM | POA: Diagnosis not present

## 2022-06-14 DIAGNOSIS — J9611 Chronic respiratory failure with hypoxia: Secondary | ICD-10-CM | POA: Diagnosis not present

## 2022-06-14 DIAGNOSIS — H538 Other visual disturbances: Secondary | ICD-10-CM

## 2022-06-14 DIAGNOSIS — D508 Other iron deficiency anemias: Secondary | ICD-10-CM | POA: Diagnosis not present

## 2022-06-14 DIAGNOSIS — I5032 Chronic diastolic (congestive) heart failure: Secondary | ICD-10-CM | POA: Diagnosis not present

## 2022-06-14 DIAGNOSIS — G4733 Obstructive sleep apnea (adult) (pediatric): Secondary | ICD-10-CM | POA: Diagnosis not present

## 2022-06-14 DIAGNOSIS — J432 Centrilobular emphysema: Secondary | ICD-10-CM

## 2022-06-14 DIAGNOSIS — E1169 Type 2 diabetes mellitus with other specified complication: Secondary | ICD-10-CM

## 2022-06-14 DIAGNOSIS — R0609 Other forms of dyspnea: Secondary | ICD-10-CM

## 2022-06-14 NOTE — Assessment & Plan Note (Signed)
Levels low - followed by coumadin clinic.

## 2022-06-14 NOTE — Progress Notes (Signed)
Patient ID: [name], male    DOB: [birth year], 67 y.o.   MRN: [id number]  This visit was conducted in person.  BP 134/70   Pulse 72   Temp (!) 97.4 F (36.3 C) (Temporal)   Ht 5' 10.75" (1.797 m)   Wt 228 lb 2 oz (103.5 kg)   SpO2 95%   BMI 32.04 kg/m    CC: 6 wk f/u visit  Subjective:   HPI: [name] is a 67 y.o. male presenting on 06/14/2022 for Medical Management of Chronic Issues (Here for 6 wk f/u.)   9 lb weight loss in the past 3 weeks - attributes to being more active and reducing portion sizes. He's been eating Lelan P[employer] Calendar frozen dinners for convenience.   He had inconclusive capsule endoscopy done by [facility] 09/2021 - poor visualization of stomach due to food content, small bowel not visualized. Decided to cancel capsule endoscopy through [facility] last month.   He is now back on coumadin.  Lab Results  Component Value Date   INR 1.3 (A) 06/11/2022   INR 1.3 (A) 06/04/2022   INR 1.7 (A) 05/28/2022  Last iron infusion 05/13/2022. He has labs scheduled next week through hematology Janese Banks).   Sees CHF clinic for chronic diastolic CHF. [facility] cardiologist Dr Albertine Patricia, last seen 03/2022.   Saw pulm Dr Genia Harold 12/2021 - rec BiPAP, insurance required ABG and PFTs prior to covering this. Does not want this either. He states he's been unable to tolerate CPAP machine.  DM - checking twice daily - 98-150s overall doing well. Continues farxiga 38m daily, amaryl 457mdaily, metformin 100038mwice daily. No low sugars <70.  Lab Results  Component Value Date   HGBA1C 6.9 (H) 04/20/2022     Remains with poor equilibrium, wonders of post-concussive symptoms after several falls with head injury. No change in tinnitus, hearing. Notes vision changes bilaterally - feels like he's in a fish bowl. States he's had unrevealing eye exam. Symptoms worse at night time - can no longer drive. [facility]   No alcohol use.   Ex-smoker - quit 2020.  MJ - daily.      Relevant past medical, surgical, family and social history reviewed and updated as indicated. Interim medical history since our last visit reviewed. Allergies and medications reviewed and updated. Outpatient Medications Prior to Visit  Medication Sig Dispense Refill   ACCU-CHEK FASTCLIX LANCETS MISC Check blood sugar once daily and as instructed. Dx 250.00 100 each 3   albuterol (VENTOLIN HFA) 108 (90 Base) MCG/ACT inhaler Inhale 2 puffs into the lungs every 6 (six) hours as needed for wheezing or shortness of breath. 8.5 each 6   amiodarone (PACERONE) 200 MG tablet Take 1 tablet (200 mg total) by mouth daily. 90 tablet 3   aspirin EC 81 MG tablet Take 1 tablet (81 mg total) by mouth daily. Swallow whole. 30 tablet 12   Blood Glucose Monitoring Suppl (BLOOD GLUCOSE MONITOR SYSTEM) W/DEVICE KIT by Does not apply route. Use to check sugar once daily and as needed Dx: E11.9 **ONE TOUCH VERIO**     Budeson-Glycopyrrol-Formoterol (BREZTRI AEROSPHERE) 160-9-4.8 MCG/ACT AERO Inhale 2 puffs into the lungs 2 (two) times daily. 32.1 g 3   carvedilol (COREG) 6.25 MG tablet TAKE 1 TABLET BY MOUTH TWICE A DAY WITH FOOD 180 tablet 0   Cholecalciferol (VITAMIN D) 50 MCG (2000 UT) CAPS Take 1 capsule (2,000 Units  total) by mouth daily. 30 capsule    dapagliflozin propanediol (FARXIGA) 10 MG TABS tablet Take 1 tablet (10 mg total) by mouth daily before breakfast. 90 tablet 3   Docusate Calcium (STOOL SOFTENER PO) Take 1 tablet by mouth daily.     Fe Fum-Fe Poly-Vit C-Lactobac (FUSION PO) Take by mouth. Patient states received sample.  Unsure of mg.     FLUoxetine (PROZAC) 40 MG capsule Take 1 capsule (40 mg total) by mouth daily. 30 capsule 6   fluticasone (FLONASE) 50 MCG/ACT nasal spray Place 2 sprays into both nostrils daily. 48 mL 3   furosemide (LASIX) 80 MG tablet TAKE 1 TABLET BY MOUTH 2 TIMES DAILY. 180 tablet 4   glimepiride (AMARYL) 4 MG tablet Take 1 tablet  (4 mg total) by mouth daily with breakfast. 90 tablet 1   glucose blood (ONETOUCH VERIO) test strip Check blood sugar 3 times a day 300 strip 3   ipratropium-albuterol (DUONEB) 0.5-2.5 (3) MG/3ML SOLN TAKE 3 MLS BY NEBULIZATION EVERY 6 (SIX) HOURS AS NEEDED (SEVERE SHORTNESS OF BREATH/WHEEZING). 360 mL 0   metFORMIN (GLUCOPHAGE) 1000 MG tablet Take 1 tablet (1,000 mg total) by mouth 2 (two) times daily with a meal. 180 tablet 1   metolazone (ZAROXOLYN) 2.5 MG tablet Take 1 tablet (2.5 mg total) by mouth once a week.     montelukast (SINGULAIR) 10 MG tablet TAKE 1 TABLET BY MOUTH EVERYDAY AT BEDTIME 90 tablet 3   OVER THE COUNTER MEDICATION Take 1 tablet by mouth daily. Superbeets     OVER THE COUNTER MEDICATION Take 1 capsule by mouth daily at 6 (six) AM. [place] sea moss     pantoprazole (PROTONIX) 40 MG tablet TAKE 1 TABLET (40 MG TOTAL) BY MOUTH TWICE A DAY BEFORE MEALS 180 tablet 0   potassium chloride (KLOR-CON M) 10 MEQ tablet Take 1 tablet (10 mEq total) by mouth daily. And an extra one on the days you take metolazone 38 tablet 5   Probiotic Product (PROBIOTIC DAILY PO) Take 1 tablet by mouth daily.     Saw Palmetto 450 MG CAPS Take 1 capsule (450 mg total) by mouth daily.  0   simvastatin (ZOCOR) 20 MG tablet TAKE 1 TABLET BY MOUTH EVERY DAY IN THE EVENING 90 tablet 1   Turmeric (QC TUMERIC COMPLEX PO) Take by mouth.     vitamin B-12 (CYANOCOBALAMIN) 1000 MCG tablet Take 1 tablet (1,000 mcg total) by mouth every Monday, Wednesday, and Friday. (Patient taking differently: Take 1,000 mcg by mouth daily.)     warfarin (COUMADIN) 5 MG tablet TAKE 1/2 TABLET BY MOUTH DAILY EXCEPT TAKE NO COUMADIN ON SUNDAY OR AS DIRECTED BY ANTICOAGULATION CLINIC (Patient taking differently: Take 2.5 mg by mouth daily at 6 (six) AM. TAKE 1/2 TABLET BY MOUTH DAILY) 60 tablet 1   No facility-administered medications prior to visit.     Per HPI unless specifically indicated in ROS section below Review of  Systems  Objective:  BP 134/70   Pulse 72   Temp (!) 97.4 F (36.3 C) (Temporal)   Ht 5' 10.75" (1.797 m)   Wt 228 lb 2 oz (103.5 kg)   SpO2 95%   BMI 32.04 kg/m   Wt Readings from Last 3 Encounters:  06/14/22 228 lb 2 oz (103.5 kg)  05/21/22 237 lb 2 oz (107.6 kg)  05/03/22 239 lb 8 oz (108.6 kg)      Physical Exam Vitals and nursing note reviewed.  Constitutional:  Appearance: Normal appearance. He is not ill-appearing.  Eyes:     Extraocular Movements: Extraocular movements intact.     Conjunctiva/sclera: Conjunctivae normal.     Pupils: Pupils are equal, round, and reactive to light.  Cardiovascular:     Rate and Rhythm: Normal rate and regular rhythm.     Pulses: Normal pulses.     Heart sounds: Normal heart sounds. No murmur heard. Pulmonary:     Effort: Pulmonary effort is normal. No respiratory distress.     Breath sounds: Normal breath sounds. No wheezing, rhonchi or rales.  Musculoskeletal:     Right lower leg: No edema.     Left lower leg: No edema.  Skin:    General: Skin is warm and dry.     Findings: No rash.  Neurological:     Mental Status: He is alert.  Psychiatric:        Mood and Affect: Mood normal.        Behavior: Behavior normal.       Results for orders placed or performed in visit on 06/11/22  POCT INR  Result Value Ref Range   INR 1.3 (A) 2.0 - 3.0    Assessment & Plan:   Problem List Items Addressed This Visit     Ex-smoker   Obstructive sleep apnea    Has declined further evaluation with sleep study and CPAP/BiPAP use      Type 2 diabetes mellitus with other specified complication (HCC)    Chronic, stable on current regimen - continue       Exertional dyspnea    Stable period after iron infusions.       Chronic diastolic heart failure (HCC)    Regularly sees CHF clinic and cardiologist.       COPD (chronic obstructive pulmonary disease) ([place])    Continues breztri with duoneb.  Still has not completed PFTs, ABG  - states he will not undergo at this time.       Iron deficiency anemia - Primary    Followed by hematology.  Continue oral fusion plus pills.  He has decided not to proceed with capsule endoscopy. Discussed risks of not doing this including missed diagnosis.  Still unsure cause of anemia.  Discussed need for regular blood work to monitor anemia.       Chronic respiratory failure with hypoxia and hypercapnia (HCC)    Multifactorial including COPD, dCHF and untreated sleep apnea.  Pt continues nocturnal supplemental oxygen use, declines CPAP or BiPAP.       Blurry vision, bilateral    Ongoing difficulty, saw eye doctor who recommended wearing reading glasses.       Long-term (current) use of anticoagulants, INR goal 2.0-3.0    Levels low - followed by coumadin clinic.         No orders of the defined types were placed in this encounter.   No orders of the defined types were placed in this encounter.   Patient Instructions  Keep lab appointment next week with blood doctor.  Continue current medicines - sugars are doing well.  We will refer you to [facility] for evaluation of painful left lower back mass.  Return in 6 weeks for physical/wellness visit   Follow up plan: Return in about 6 weeks (around 07/26/2022), or if symptoms worsen or fail to improve, for annual exam, prior fasting for blood work, medicare wellness visit.  [name], MD

## 2022-06-14 NOTE — Assessment & Plan Note (Addendum)
Multifactorial including COPD, dCHF and untreated sleep apnea.  Pt continues nocturnal supplemental oxygen use, declines CPAP or BiPAP.

## 2022-06-14 NOTE — Assessment & Plan Note (Signed)
Regularly sees CHF clinic and cardiologist.

## 2022-06-14 NOTE — Assessment & Plan Note (Signed)
Chronic, stable on current regimen - continue. 

## 2022-06-14 NOTE — Assessment & Plan Note (Addendum)
Stable period after iron infusions.

## 2022-06-14 NOTE — Assessment & Plan Note (Signed)
Continues breztri with duoneb.  Still has not completed PFTs, ABG - states he will not undergo at this time.

## 2022-06-14 NOTE — Assessment & Plan Note (Signed)
Has declined further evaluation with sleep study and CPAP/BiPAP use

## 2022-06-14 NOTE — Assessment & Plan Note (Signed)
Ongoing difficulty, saw eye doctor who recommended wearing reading glasses.

## 2022-06-14 NOTE — Assessment & Plan Note (Addendum)
Followed by hematology.  Continue oral fusion plus pills.  He has decided not to proceed with capsule endoscopy. Discussed risks of not doing this including missed diagnosis.  Still unsure cause of anemia.  Discussed need for regular blood work to monitor anemia.

## 2022-06-14 NOTE — Patient Instructions (Addendum)
Keep lab appointment next week with blood doctor.  Continue current medicines - sugars are doing well.  We will refer you to [facility] for evaluation of painful left lower back mass.  Return in 6 weeks for physical/wellness visit

## 2022-06-15 DIAGNOSIS — Z5181 Encounter for therapeutic drug level monitoring: Secondary | ICD-10-CM | POA: Diagnosis not present

## 2022-06-15 DIAGNOSIS — J449 Chronic obstructive pulmonary disease, unspecified: Secondary | ICD-10-CM | POA: Diagnosis not present

## 2022-06-15 DIAGNOSIS — Z9181 History of falling: Secondary | ICD-10-CM | POA: Diagnosis not present

## 2022-06-15 DIAGNOSIS — Z7984 Long term (current) use of oral hypoglycemic drugs: Secondary | ICD-10-CM | POA: Diagnosis not present

## 2022-06-15 DIAGNOSIS — I872 Venous insufficiency (chronic) (peripheral): Secondary | ICD-10-CM | POA: Diagnosis not present

## 2022-06-15 DIAGNOSIS — I25119 Atherosclerotic heart disease of native coronary artery with unspecified angina pectoris: Secondary | ICD-10-CM | POA: Diagnosis not present

## 2022-06-15 DIAGNOSIS — I252 Old myocardial infarction: Secondary | ICD-10-CM | POA: Diagnosis not present

## 2022-06-15 DIAGNOSIS — G43909 Migraine, unspecified, not intractable, without status migrainosus: Secondary | ICD-10-CM | POA: Diagnosis not present

## 2022-06-15 DIAGNOSIS — I420 Dilated cardiomyopathy: Secondary | ICD-10-CM | POA: Diagnosis not present

## 2022-06-15 DIAGNOSIS — Z7901 Long term (current) use of anticoagulants: Secondary | ICD-10-CM | POA: Diagnosis not present

## 2022-06-15 DIAGNOSIS — Z9981 Dependence on supplemental oxygen: Secondary | ICD-10-CM | POA: Diagnosis not present

## 2022-06-15 DIAGNOSIS — I5042 Chronic combined systolic (congestive) and diastolic (congestive) heart failure: Secondary | ICD-10-CM | POA: Diagnosis not present

## 2022-06-15 DIAGNOSIS — I110 Hypertensive heart disease with heart failure: Secondary | ICD-10-CM | POA: Diagnosis not present

## 2022-06-15 DIAGNOSIS — F32A Depression, unspecified: Secondary | ICD-10-CM | POA: Diagnosis not present

## 2022-06-15 DIAGNOSIS — E876 Hypokalemia: Secondary | ICD-10-CM | POA: Diagnosis not present

## 2022-06-15 DIAGNOSIS — E785 Hyperlipidemia, unspecified: Secondary | ICD-10-CM | POA: Diagnosis not present

## 2022-06-15 DIAGNOSIS — Z87891 Personal history of nicotine dependence: Secondary | ICD-10-CM | POA: Diagnosis not present

## 2022-06-15 DIAGNOSIS — I4891 Unspecified atrial fibrillation: Secondary | ICD-10-CM | POA: Diagnosis not present

## 2022-06-15 DIAGNOSIS — K219 Gastro-esophageal reflux disease without esophagitis: Secondary | ICD-10-CM | POA: Diagnosis not present

## 2022-06-15 DIAGNOSIS — M1990 Unspecified osteoarthritis, unspecified site: Secondary | ICD-10-CM | POA: Diagnosis not present

## 2022-06-15 DIAGNOSIS — G4733 Obstructive sleep apnea (adult) (pediatric): Secondary | ICD-10-CM | POA: Diagnosis not present

## 2022-06-15 DIAGNOSIS — E1165 Type 2 diabetes mellitus with hyperglycemia: Secondary | ICD-10-CM | POA: Diagnosis not present

## 2022-06-16 DIAGNOSIS — G4733 Obstructive sleep apnea (adult) (pediatric): Secondary | ICD-10-CM | POA: Diagnosis not present

## 2022-06-16 DIAGNOSIS — J9601 Acute respiratory failure with hypoxia: Secondary | ICD-10-CM | POA: Diagnosis not present

## 2022-06-18 ENCOUNTER — Ambulatory Visit (INDEPENDENT_AMBULATORY_CARE_PROVIDER_SITE_OTHER): Payer: Medicare Other

## 2022-06-18 DIAGNOSIS — E785 Hyperlipidemia, unspecified: Secondary | ICD-10-CM | POA: Diagnosis not present

## 2022-06-18 DIAGNOSIS — Z7984 Long term (current) use of oral hypoglycemic drugs: Secondary | ICD-10-CM | POA: Diagnosis not present

## 2022-06-18 DIAGNOSIS — G43909 Migraine, unspecified, not intractable, without status migrainosus: Secondary | ICD-10-CM | POA: Diagnosis not present

## 2022-06-18 DIAGNOSIS — I5042 Chronic combined systolic (congestive) and diastolic (congestive) heart failure: Secondary | ICD-10-CM | POA: Diagnosis not present

## 2022-06-18 DIAGNOSIS — M1990 Unspecified osteoarthritis, unspecified site: Secondary | ICD-10-CM | POA: Diagnosis not present

## 2022-06-18 DIAGNOSIS — Z87891 Personal history of nicotine dependence: Secondary | ICD-10-CM | POA: Diagnosis not present

## 2022-06-18 DIAGNOSIS — E1165 Type 2 diabetes mellitus with hyperglycemia: Secondary | ICD-10-CM | POA: Diagnosis not present

## 2022-06-18 DIAGNOSIS — Z7901 Long term (current) use of anticoagulants: Secondary | ICD-10-CM

## 2022-06-18 DIAGNOSIS — J449 Chronic obstructive pulmonary disease, unspecified: Secondary | ICD-10-CM | POA: Diagnosis not present

## 2022-06-18 DIAGNOSIS — G4733 Obstructive sleep apnea (adult) (pediatric): Secondary | ICD-10-CM | POA: Diagnosis not present

## 2022-06-18 DIAGNOSIS — I420 Dilated cardiomyopathy: Secondary | ICD-10-CM | POA: Diagnosis not present

## 2022-06-18 DIAGNOSIS — I110 Hypertensive heart disease with heart failure: Secondary | ICD-10-CM | POA: Diagnosis not present

## 2022-06-18 DIAGNOSIS — K219 Gastro-esophageal reflux disease without esophagitis: Secondary | ICD-10-CM | POA: Diagnosis not present

## 2022-06-18 DIAGNOSIS — Z9181 History of falling: Secondary | ICD-10-CM | POA: Diagnosis not present

## 2022-06-18 DIAGNOSIS — I4891 Unspecified atrial fibrillation: Secondary | ICD-10-CM | POA: Diagnosis not present

## 2022-06-18 DIAGNOSIS — Z5181 Encounter for therapeutic drug level monitoring: Secondary | ICD-10-CM | POA: Diagnosis not present

## 2022-06-18 DIAGNOSIS — E876 Hypokalemia: Secondary | ICD-10-CM | POA: Diagnosis not present

## 2022-06-18 DIAGNOSIS — I872 Venous insufficiency (chronic) (peripheral): Secondary | ICD-10-CM | POA: Diagnosis not present

## 2022-06-18 DIAGNOSIS — I252 Old myocardial infarction: Secondary | ICD-10-CM | POA: Diagnosis not present

## 2022-06-18 DIAGNOSIS — F32A Depression, unspecified: Secondary | ICD-10-CM | POA: Diagnosis not present

## 2022-06-18 DIAGNOSIS — Z9981 Dependence on supplemental oxygen: Secondary | ICD-10-CM | POA: Diagnosis not present

## 2022-06-18 DIAGNOSIS — I25119 Atherosclerotic heart disease of native coronary artery with unspecified angina pectoris: Secondary | ICD-10-CM | POA: Diagnosis not present

## 2022-06-18 LAB — POCT INR: INR: 2.4 (ref 2.0–3.0)

## 2022-06-18 NOTE — Progress Notes (Signed)
[name] from [facility], [phone], called with INR result of 2.4.  Continue to take 1/2 tablet daily except take 1 tablet on Monday, Wednesday, and Friday. Calendar updated to reflect these dosage instructions. Recheck in 1 week.   Pt and [name] given instructions over the phone, both verbalize understanding.

## 2022-06-18 NOTE — Patient Instructions (Signed)
Continue to take 1/2 tablet daily except take 1 tablet on Monday, Wednesday, and Friday. Recheck in 1 week.

## 2022-06-23 ENCOUNTER — Other Ambulatory Visit: Payer: Self-pay | Admitting: Family Medicine

## 2022-06-24 ENCOUNTER — Inpatient Hospital Stay: Payer: Medicare Other | Attending: Oncology

## 2022-06-24 DIAGNOSIS — D509 Iron deficiency anemia, unspecified: Secondary | ICD-10-CM | POA: Diagnosis not present

## 2022-06-24 LAB — CBC
HCT: 34.6 % — ABNORMAL LOW (ref 39.0–52.0)
Hemoglobin: 10.6 g/dL — ABNORMAL LOW (ref 13.0–17.0)
MCH: 25.7 pg — ABNORMAL LOW (ref 26.0–34.0)
MCHC: 30.6 g/dL (ref 30.0–36.0)
MCV: 84 fL (ref 80.0–100.0)
Platelets: 245 10*3/uL (ref 150–400)
RBC: 4.12 MIL/uL — ABNORMAL LOW (ref 4.22–5.81)
RDW: 17.9 % — ABNORMAL HIGH (ref 11.5–15.5)
WBC: 10.9 10*3/uL — ABNORMAL HIGH (ref 4.0–10.5)
nRBC: 0 % (ref 0.0–0.2)

## 2022-06-24 LAB — FERRITIN: Ferritin: 12 ng/mL — ABNORMAL LOW (ref 24–336)

## 2022-06-24 LAB — IRON AND TIBC
Iron: 30 ug/dL — ABNORMAL LOW (ref 45–182)
Saturation Ratios: 8 % — ABNORMAL LOW (ref 17.9–39.5)
TIBC: 392 ug/dL (ref 250–450)
UIBC: 362 ug/dL

## 2022-06-24 NOTE — Telephone Encounter (Signed)
Rx stopped by Dr. Darnell Level. (See 02/19/22 OV notes.)

## 2022-06-25 ENCOUNTER — Ambulatory Visit (INDEPENDENT_AMBULATORY_CARE_PROVIDER_SITE_OTHER): Payer: Medicare Other

## 2022-06-25 ENCOUNTER — Telehealth: Payer: Self-pay | Admitting: *Deleted

## 2022-06-25 DIAGNOSIS — E1165 Type 2 diabetes mellitus with hyperglycemia: Secondary | ICD-10-CM | POA: Diagnosis not present

## 2022-06-25 DIAGNOSIS — Z7901 Long term (current) use of anticoagulants: Secondary | ICD-10-CM

## 2022-06-25 DIAGNOSIS — I25119 Atherosclerotic heart disease of native coronary artery with unspecified angina pectoris: Secondary | ICD-10-CM | POA: Diagnosis not present

## 2022-06-25 DIAGNOSIS — Z9181 History of falling: Secondary | ICD-10-CM | POA: Diagnosis not present

## 2022-06-25 DIAGNOSIS — G43909 Migraine, unspecified, not intractable, without status migrainosus: Secondary | ICD-10-CM | POA: Diagnosis not present

## 2022-06-25 DIAGNOSIS — Z5181 Encounter for therapeutic drug level monitoring: Secondary | ICD-10-CM | POA: Diagnosis not present

## 2022-06-25 DIAGNOSIS — Z87891 Personal history of nicotine dependence: Secondary | ICD-10-CM | POA: Diagnosis not present

## 2022-06-25 DIAGNOSIS — I4891 Unspecified atrial fibrillation: Secondary | ICD-10-CM | POA: Diagnosis not present

## 2022-06-25 DIAGNOSIS — Z9981 Dependence on supplemental oxygen: Secondary | ICD-10-CM | POA: Diagnosis not present

## 2022-06-25 DIAGNOSIS — I252 Old myocardial infarction: Secondary | ICD-10-CM | POA: Diagnosis not present

## 2022-06-25 DIAGNOSIS — Z7984 Long term (current) use of oral hypoglycemic drugs: Secondary | ICD-10-CM | POA: Diagnosis not present

## 2022-06-25 DIAGNOSIS — I420 Dilated cardiomyopathy: Secondary | ICD-10-CM | POA: Diagnosis not present

## 2022-06-25 DIAGNOSIS — F32A Depression, unspecified: Secondary | ICD-10-CM | POA: Diagnosis not present

## 2022-06-25 DIAGNOSIS — J449 Chronic obstructive pulmonary disease, unspecified: Secondary | ICD-10-CM | POA: Diagnosis not present

## 2022-06-25 DIAGNOSIS — I872 Venous insufficiency (chronic) (peripheral): Secondary | ICD-10-CM | POA: Diagnosis not present

## 2022-06-25 DIAGNOSIS — E785 Hyperlipidemia, unspecified: Secondary | ICD-10-CM | POA: Diagnosis not present

## 2022-06-25 DIAGNOSIS — I5042 Chronic combined systolic (congestive) and diastolic (congestive) heart failure: Secondary | ICD-10-CM | POA: Diagnosis not present

## 2022-06-25 DIAGNOSIS — K219 Gastro-esophageal reflux disease without esophagitis: Secondary | ICD-10-CM | POA: Diagnosis not present

## 2022-06-25 DIAGNOSIS — M1990 Unspecified osteoarthritis, unspecified site: Secondary | ICD-10-CM | POA: Diagnosis not present

## 2022-06-25 DIAGNOSIS — I110 Hypertensive heart disease with heart failure: Secondary | ICD-10-CM | POA: Diagnosis not present

## 2022-06-25 DIAGNOSIS — E876 Hypokalemia: Secondary | ICD-10-CM | POA: Diagnosis not present

## 2022-06-25 DIAGNOSIS — G4733 Obstructive sleep apnea (adult) (pediatric): Secondary | ICD-10-CM | POA: Diagnosis not present

## 2022-06-25 LAB — POCT INR: INR: 2.7 (ref 2.0–3.0)

## 2022-06-25 NOTE — Telephone Encounter (Signed)
Left message that pt iron studies are low and Dr. Janese Banks suggests to give IV iron. While I am typing the note  the pt. Calls back and he wants the IV iron. He has had it before and usually it is the feraheme. I told him I will check on insurance and and then let him know. He said just to call him and leave the message of what days and times and he will be there

## 2022-06-25 NOTE — Telephone Encounter (Signed)
-----   Message from Sindy Guadeloupe, MD sent at 06/24/2022  1:26 PM EST ----- Needs iv iron

## 2022-06-25 NOTE — Progress Notes (Signed)
[name] from [facility], [phone], called with INR result of 2.7.  Continue to take 1/2 tablet daily except take 1 tablet on Monday, Wednesday, and Friday. Calendar updated to reflect these dosage instructions. Recheck in 1 week.   Pt and [name] given instructions over the phone, both verbalize understanding.

## 2022-06-25 NOTE — Patient Instructions (Addendum)
Pre visit review using our clinic review tool, if applicable. No additional management support is needed unless otherwise documented below in the visit note.  Continue to take 1/2 tablet daily except take 1 tablet on Monday, Wednesday, and Friday. Calendar updated to reflect these dosage instructions. Recheck in 1 week.

## 2022-06-28 ENCOUNTER — Telehealth: Payer: Self-pay | Admitting: *Deleted

## 2022-06-28 NOTE — Telephone Encounter (Signed)
I checked on IV iron and it was feraheme he an get and Jinny Blossom is calling and leaving a message to get 2 infusions.

## 2022-07-01 ENCOUNTER — Ambulatory Visit (INDEPENDENT_AMBULATORY_CARE_PROVIDER_SITE_OTHER): Payer: Medicare Other

## 2022-07-01 ENCOUNTER — Telehealth: Payer: Self-pay

## 2022-07-01 ENCOUNTER — Ambulatory Visit: Payer: Self-pay

## 2022-07-01 DIAGNOSIS — Z7984 Long term (current) use of oral hypoglycemic drugs: Secondary | ICD-10-CM | POA: Diagnosis not present

## 2022-07-01 DIAGNOSIS — K219 Gastro-esophageal reflux disease without esophagitis: Secondary | ICD-10-CM | POA: Diagnosis not present

## 2022-07-01 DIAGNOSIS — I420 Dilated cardiomyopathy: Secondary | ICD-10-CM | POA: Diagnosis not present

## 2022-07-01 DIAGNOSIS — I110 Hypertensive heart disease with heart failure: Secondary | ICD-10-CM | POA: Diagnosis not present

## 2022-07-01 DIAGNOSIS — M1990 Unspecified osteoarthritis, unspecified site: Secondary | ICD-10-CM | POA: Diagnosis not present

## 2022-07-01 DIAGNOSIS — Z7901 Long term (current) use of anticoagulants: Secondary | ICD-10-CM | POA: Diagnosis not present

## 2022-07-01 DIAGNOSIS — I252 Old myocardial infarction: Secondary | ICD-10-CM | POA: Diagnosis not present

## 2022-07-01 DIAGNOSIS — E1165 Type 2 diabetes mellitus with hyperglycemia: Secondary | ICD-10-CM | POA: Diagnosis not present

## 2022-07-01 DIAGNOSIS — J449 Chronic obstructive pulmonary disease, unspecified: Secondary | ICD-10-CM | POA: Diagnosis not present

## 2022-07-01 DIAGNOSIS — Z9981 Dependence on supplemental oxygen: Secondary | ICD-10-CM | POA: Diagnosis not present

## 2022-07-01 DIAGNOSIS — Z9181 History of falling: Secondary | ICD-10-CM | POA: Diagnosis not present

## 2022-07-01 DIAGNOSIS — I4891 Unspecified atrial fibrillation: Secondary | ICD-10-CM | POA: Diagnosis not present

## 2022-07-01 DIAGNOSIS — I5042 Chronic combined systolic (congestive) and diastolic (congestive) heart failure: Secondary | ICD-10-CM | POA: Diagnosis not present

## 2022-07-01 DIAGNOSIS — I872 Venous insufficiency (chronic) (peripheral): Secondary | ICD-10-CM | POA: Diagnosis not present

## 2022-07-01 DIAGNOSIS — F32A Depression, unspecified: Secondary | ICD-10-CM | POA: Diagnosis not present

## 2022-07-01 DIAGNOSIS — Z87891 Personal history of nicotine dependence: Secondary | ICD-10-CM | POA: Diagnosis not present

## 2022-07-01 DIAGNOSIS — Z5181 Encounter for therapeutic drug level monitoring: Secondary | ICD-10-CM | POA: Diagnosis not present

## 2022-07-01 DIAGNOSIS — E785 Hyperlipidemia, unspecified: Secondary | ICD-10-CM | POA: Diagnosis not present

## 2022-07-01 DIAGNOSIS — G43909 Migraine, unspecified, not intractable, without status migrainosus: Secondary | ICD-10-CM | POA: Diagnosis not present

## 2022-07-01 DIAGNOSIS — G4733 Obstructive sleep apnea (adult) (pediatric): Secondary | ICD-10-CM | POA: Diagnosis not present

## 2022-07-01 DIAGNOSIS — I25119 Atherosclerotic heart disease of native coronary artery with unspecified angina pectoris: Secondary | ICD-10-CM | POA: Diagnosis not present

## 2022-07-01 DIAGNOSIS — E876 Hypokalemia: Secondary | ICD-10-CM | POA: Diagnosis not present

## 2022-07-01 LAB — POCT INR: INR: 1.7 — AB (ref 2.0–3.0)

## 2022-07-01 NOTE — Patient Outreach (Signed)
  Care Coordination   07/01/2022 Name: [name] MRN: [id number] DOB: 01-Oct-1955   Care Coordination Outreach Attempts:  An unsuccessful telephone outreach was attempted for a scheduled appointment today. HIPAA compliant message left with call back phone number.   Follow Up Plan:  Additional outreach attempts will be made to offer the patient care coordination information and services.   Encounter Outcome:  No Answer   Care Coordination Interventions:  No, not indicated    Quinn Plowman [facility] [phone] direct line

## 2022-07-01 NOTE — Patient Instructions (Signed)
Visit Information  Thank you for taking time to visit with me today. Please don't hesitate to contact me or your primary care provider if care coordination services are needed in the future.    Following is what we discussed today:  Goals have been met.  Closing out to care coordination services.    Please call the care guide team at [phone] if you need to cancel or reschedule your appointment.   If you are experiencing a Mental Health or [place] or need someone to talk to, please call the Suicide and Crisis Lifeline: 988 call 1-800-273-TALK (toll free, 24 hour hotline)  Patient verbalizes understanding of instructions and care plan provided today and agrees to view in [place]. Active MyChart status and patient understanding of how to access instructions and care plan via MyChart confirmed with patient.     Quinn Plowman RN,BSN,CCM [place] [facility] [phone] direct line

## 2022-07-01 NOTE — Progress Notes (Signed)
[name] from [facility], [phone], called with INR result of 1.7. Increase dose today to take 1 tablet and the continue to take 1/2 tablet daily except take 1 tablet on Monday, Wednesday, and Friday. Recheck in 3 week.  Pt and [name] given instructions over the phone, both verbalize understanding. [name] reports HH is going to d/c pt so he will need to come into the office for INR check. Scheduled pt for 3 weeks in [facility] coumadin clinic.

## 2022-07-01 NOTE — Patient Outreach (Signed)
  Care Coordination   Follow Up Visit Note   07/01/2022 Name: [name] MRN: [id number] DOB: [birth year]  [name] is a 66 y.o. year old male who sees [name] Bush, MD for primary care. I spoke with  [name] by phone today.  What matters to the patients health and wellness today?  Patient states he is doing well. He reports continuing on his weight loss journey. Reports being more active and portion controlling meals.  He states he started out at 315 lbs now down to 222 lbs as of today.  Patient states he still continues with some balance issues.  He reports when he started with home PT services he could barely walk and had to use a cane.  Patient states now he is ambulating without cane.  He states he thinks his home health services will be ending soon.  Patient states he still has some blurred vision. He reports having follow up visit with eye doctor 2 weeks ago.   Patient reports having lower back mass that he's had for years.  He states it never bothered him until he lost the weight.  He states now the mass is more annoying and feels internal.  Patient states he and his primary provider talked about a referral to the general surgeon at last visit to address the mass issue. Patient states he has not heard anything regarding an appointment.   Patient verbally agreed that his care coordination goals have been met.  Agreeable to closing out care coordination services at this time.     Goals Addressed             This Visit's Progress    COMPLETED: Patient Stated:  Managing balance/ equilibrium issue       Interventions Today    Flowsheet Row Most Recent Value  Chronic Disease   Chronic disease during today's visit Other  [Balance / equilibrium issues, Anemia, Blurred vision. Evaluation of current treatment plan related to mentioned conditions and patients adherence to plan as established by provider.]  General Interventions   General Interventions  Discussed/Reviewed General Interventions Reviewed, Doctor Visits  [Assessed for ongoing symptoms related to balance, blurred vision, lightheadedness/ dizziness.  Assessed for ongoing home health services. Reviewed scheduled upcoming appointments]  Doctor Visits Discussed/Reviewed Doctor Visits Reviewed  [Discussed recent PCP visit. Discussed lower back mass duration and discomfort level  Patient advised to discuss proposed referral to general surgeon at scheduled PCP appointment on 07/02/23. Advised to notify provider for ongoing or new concerns]  Exercise Interventions   Exercise Discussed/Reviewed Physical Activity  Physical Activity Discussed/Reviewed Physical Activity Reviewed  [patient advised to remain physically active.  Advised to continue incorporating exercises provided by PT daily]  Pharmacy Interventions   Pharmacy Dicussed/Reviewed Pharmacy Topics Reviewed  [medications reviewed and compliance discussed.]  Safety Interventions   Safety Discussed/Reviewed Safety Reviewed  [Reviewed fall safety,]     Goals reviewed with patient.  Patient advised to contact primary care provider or RNCM if care coordination services needed in the future.  Confirmed patient has contact phone number for RNCM.           SDOH assessments and interventions completed:  No     Care Coordination Interventions:  Yes, provided   Follow up plan: No further intervention required. Goals met  Encounter Outcome:  Pt. Visit Completed   Quinn Plowman RN,BSN,CCM [place] [phone] direct line

## 2022-07-01 NOTE — Patient Instructions (Addendum)
Pre visit review using our clinic review tool, if applicable. No additional management support is needed unless otherwise documented below in the visit note.  Increase dose today to take 1 tablet and the continue to take 1/2 tablet daily except take 1 tablet on Monday, Wednesday, and Friday. Recheck in 3 week.

## 2022-07-02 ENCOUNTER — Ambulatory Visit (INDEPENDENT_AMBULATORY_CARE_PROVIDER_SITE_OTHER): Payer: Medicare Other | Admitting: Family Medicine

## 2022-07-02 ENCOUNTER — Ambulatory Visit (INDEPENDENT_AMBULATORY_CARE_PROVIDER_SITE_OTHER)
Admission: RE | Admit: 2022-07-02 | Discharge: 2022-07-02 | Disposition: A | Discharge status: Home / Self Care | Payer: Medicare Other | Source: Ambulatory Visit | Attending: Family Medicine | Admitting: Family Medicine

## 2022-07-02 ENCOUNTER — Encounter: Payer: Self-pay | Admitting: Family Medicine

## 2022-07-02 VITALS — BP 134/72 | HR 66 | Temp 97.4°F | Ht 70.75 in | Wt 233.1 lb

## 2022-07-02 DIAGNOSIS — I4819 Other persistent atrial fibrillation: Secondary | ICD-10-CM

## 2022-07-02 DIAGNOSIS — D508 Other iron deficiency anemias: Secondary | ICD-10-CM | POA: Diagnosis not present

## 2022-07-02 DIAGNOSIS — G4733 Obstructive sleep apnea (adult) (pediatric): Secondary | ICD-10-CM | POA: Diagnosis not present

## 2022-07-02 DIAGNOSIS — I5032 Chronic diastolic (congestive) heart failure: Secondary | ICD-10-CM | POA: Diagnosis not present

## 2022-07-02 DIAGNOSIS — M7989 Other specified soft tissue disorders: Secondary | ICD-10-CM | POA: Diagnosis not present

## 2022-07-02 DIAGNOSIS — R222 Localized swelling, mass and lump, trunk: Secondary | ICD-10-CM

## 2022-07-02 DIAGNOSIS — M25522 Pain in left elbow: Secondary | ICD-10-CM | POA: Insufficient documentation

## 2022-07-02 DIAGNOSIS — Z7901 Long term (current) use of anticoagulants: Secondary | ICD-10-CM | POA: Diagnosis not present

## 2022-07-02 DIAGNOSIS — R21 Rash and other nonspecific skin eruption: Secondary | ICD-10-CM

## 2022-07-02 MED ORDER — METHOCARBAMOL 500 MG PO TABS: 500.0000 mg | ORAL_TABLET | Freq: Three times a day (TID) | ORAL | 0 refills | Status: DC | PRN

## 2022-07-02 MED ORDER — TRIAMCINOLONE ACETONIDE 0.1 % EX CREA: 1.0000 | TOPICAL_CREAM | Freq: Two times a day (BID) | CUTANEOUS | 0 refills | Status: DC

## 2022-07-02 NOTE — Assessment & Plan Note (Signed)
Persistent pain since one of his falls over fall 2023.  ROM preserved however has reproducible tenderness over olecranon.  Not consistent with olecranon bursitis. Check elbow films today.

## 2022-07-02 NOTE — Assessment & Plan Note (Addendum)
Presumed sebaceous cyst by prior CT scans.  Becoming more painful - will refer to gen surg for possible removal.

## 2022-07-02 NOTE — Assessment & Plan Note (Addendum)
Most consistent with contact dermatitis. Denies poison ivy exposure. Has had fiberglass exposure. Pura Spice has been most effective. Rx triamcinolone cream with topical steroid precautions. Update if not improved with this.

## 2022-07-02 NOTE — Assessment & Plan Note (Signed)
Pending sleep study and f/u with pulm - states he's in communication with sleep lab.

## 2022-07-02 NOTE — Patient Instructions (Addendum)
Call [facility] office to schedule appointment [phone]  May use robaxin '500mg'$  twice daily as needed for muscle relaxation. Don't take and drive.  For elbow - check xray today  For skin rash - suspicious for contact dermatitis. Treat with stronger steroid cream sent to pharmacy.

## 2022-07-02 NOTE — Assessment & Plan Note (Signed)
Sees heme, has seen GI, declined rpt capsule endoscopy.  Continues coumadin, pending rpt iron infusion.

## 2022-07-02 NOTE — Progress Notes (Signed)
Patient ID: [name], male    DOB: 03/13/1956, 67 y.o.   MRN: [id number]  This visit was conducted in person.  BP 134/72   Pulse 66   Temp (!) 97.4 F (36.3 C) (Temporal)   Ht 5' 10.75" (1.797 m)   Wt 233 lb 2 oz (105.7 kg)   SpO2 94%   BMI 32.74 kg/m    CC: R arm rash Subjective:   HPI: [name] is a 67 y.o. male presenting on 07/02/2022 for Rash (C/o rash on R arm. Started 06/25/22. Area is itchy. Tried Benadryl cream, helpful. Also, c/o painful lump on back and L elbow. )   1 wk h/o itchy rash on R arm. May have started after exposure to fiberglass (while cutting wood panel in living room with a razor). Triple abx ointment didn't help, cortizone-10 didn't help. Treated with benadryl cream with benefit. He's been working outdoors in yard but denies exposure to poison ivy. No new lotions, detergents, soaps or shampoos. No other areas of skin affected. No oral lesions.   Requests gen surgery referral for painful left lower back mass. Feels it is enlarging. By prior CT scan 11/2021: smooth marginated high density structure in subcutaneous plane in the left side of back, possibly incidental benign process such as large sebaceous cyst or some other benign process. Similar finding was seen in previous examinations dating as far back as 08/11/2016.  Injured L elbow after fall months ago. Painful to olecranon area since. No redness, warmth, no pain with palpation. Main pain with pressure to this area when leaning arm. Hasn't tried anything for this yet.   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 Decided to cancel capsule endoscopy through [facility] 04/2022.   Latest iron infusion 05/13/2022 - had f/u labwork drawn last month showing persistent IDA - planned rpt feraheme IV iron infusion 07/05/2022 and f/u heme appt 10/22/2022.   Sees CHF clinic for  chronic diastolic CHF. [facility] cardiologist Dr Albertine Patricia, last seen 03/2022.      Relevant past medical, surgical, family and social history reviewed and updated as indicated. Interim medical history since our last visit reviewed. Allergies and medications reviewed and updated. Outpatient Medications Prior to Visit  Medication Sig Dispense Refill   ACCU-CHEK FASTCLIX LANCETS MISC Check blood sugar once daily and as instructed. Dx 250.00 100 each 3   albuterol (VENTOLIN HFA) 108 (90 Base) MCG/ACT inhaler Inhale 2 puffs into the lungs every 6 (six) hours as needed for wheezing or shortness of breath. 8.5 each 6   amiodarone (PACERONE) 200 MG tablet Take 1 tablet (200 mg total) by mouth daily. 90 tablet 3   aspirin EC 81 MG tablet Take 1 tablet (81 mg total) by mouth daily. Swallow whole. 30 tablet 12   Blood Glucose Monitoring Suppl (BLOOD GLUCOSE MONITOR SYSTEM) W/DEVICE KIT by Does not apply route. Use to check sugar once daily and as needed Dx: E11.9 **ONE TOUCH VERIO**     Budeson-Glycopyrrol-Formoterol (BREZTRI AEROSPHERE) 160-9-4.8 MCG/ACT AERO Inhale 2 puffs into the lungs 2 (two) times daily. 32.1 g 3   carvedilol (COREG) 6.25 MG tablet TAKE 1 TABLET BY MOUTH TWICE A DAY WITH FOOD 180 tablet 0   Cholecalciferol (VITAMIN D) 50 MCG (2000 UT) CAPS Take 1 capsule (2,000 Units total) by mouth daily. 30 capsule    dapagliflozin propanediol (FARXIGA)  10 MG TABS tablet Take 1 tablet (10 mg total) by mouth daily before breakfast. 90 tablet 3   Docusate Calcium (STOOL SOFTENER PO) Take 1 tablet by mouth daily.     Fe Fum-Fe Poly-Vit C-Lactobac (FUSION PO) Take by mouth. Patient states received sample.  Unsure of mg.     FLUoxetine (PROZAC) 40 MG capsule Take 1 capsule (40 mg total) by mouth daily. 30 capsule 6   fluticasone (FLONASE) 50 MCG/ACT nasal spray Place 2 sprays into both nostrils daily. 48 mL 3   furosemide (LASIX) 80 MG tablet TAKE 1 TABLET BY MOUTH 2 TIMES DAILY. 180 tablet 4    glimepiride (AMARYL) 4 MG tablet Take 1 tablet (4 mg total) by mouth daily with breakfast. 90 tablet 1   glucose blood (ONETOUCH VERIO) test strip Check blood sugar 3 times a day 300 strip 3   ipratropium-albuterol (DUONEB) 0.5-2.5 (3) MG/3ML SOLN TAKE 3 MLS BY NEBULIZATION EVERY 6 (SIX) HOURS AS NEEDED (SEVERE SHORTNESS OF BREATH/WHEEZING). 360 mL 0   metFORMIN (GLUCOPHAGE) 1000 MG tablet Take 1 tablet (1,000 mg total) by mouth 2 (two) times daily with a meal. 180 tablet 1   metolazone (ZAROXOLYN) 2.5 MG tablet Take 1 tablet (2.5 mg total) by mouth once a week.     montelukast (SINGULAIR) 10 MG tablet TAKE 1 TABLET BY MOUTH EVERYDAY AT BEDTIME 90 tablet 3   OVER THE COUNTER MEDICATION Take 1 tablet by mouth daily. Superbeets     OVER THE COUNTER MEDICATION Take 1 capsule by mouth daily at 6 (six) AM. [place] sea moss     pantoprazole (PROTONIX) 40 MG tablet TAKE 1 TABLET (40 MG TOTAL) BY MOUTH TWICE A DAY BEFORE MEALS 180 tablet 0   potassium chloride (KLOR-CON M) 10 MEQ tablet Take 1 tablet (10 mEq total) by mouth daily. And an extra one on the days you take metolazone 38 tablet 5   Probiotic Product (PROBIOTIC DAILY PO) Take 1 tablet by mouth daily.     Saw Palmetto 450 MG CAPS Take 1 capsule (450 mg total) by mouth daily.  0   simvastatin (ZOCOR) 20 MG tablet TAKE 1 TABLET BY MOUTH EVERY DAY IN THE EVENING 90 tablet 1   Turmeric (QC TUMERIC COMPLEX PO) Take by mouth.     vitamin B-12 (CYANOCOBALAMIN) 1000 MCG tablet Take 1 tablet (1,000 mcg total) by mouth every Monday, Wednesday, and Friday. (Patient taking differently: Take 1,000 mcg by mouth daily.)     warfarin (COUMADIN) 5 MG tablet TAKE 1/2 TABLET BY MOUTH DAILY EXCEPT TAKE NO COUMADIN ON SUNDAY OR AS DIRECTED BY ANTICOAGULATION CLINIC (Patient taking differently: Take 2.5 mg by mouth daily at 6 (six) AM. TAKE 1/2 TABLET BY MOUTH DAILY) 60 tablet 1   No facility-administered medications prior to visit.     Per HPI unless specifically  indicated in ROS section below Review of Systems  Objective:  BP 134/72   Pulse 66   Temp (!) 97.4 F (36.3 C) (Temporal)   Ht 5' 10.75" (1.797 m)   Wt 233 lb 2 oz (105.7 kg)   SpO2 94%   BMI 32.74 kg/m   Wt Readings from Last 3 Encounters:  07/02/22 233 lb 2 oz (105.7 kg)  06/14/22 228 lb 2 oz (103.5 kg)  05/21/22 237 lb 2 oz (107.6 kg)      Physical Exam Vitals and nursing note reviewed.  Constitutional:      Appearance: Normal appearance. He is not ill-appearing.  HENT:  Head: Normocephalic and atraumatic.  Musculoskeletal:     Comments:  L elbow- FROM flexion/extension without significant discomfort. Tender to palpation at olecranon without significant swelling at olecranon bursa or erythema, warmth   Skin:    General: Skin is warm and dry.     Findings: Erythema and rash present.          Comments:  Erythematous blanching maculopapular rash in 2 patches to right forearm (lateral elbow and medial forearm) intensely pruritic with central scaling Large mass ~10 cm diameter to left lower back without induration, redness, warmth or significant discomfort to palpation  Neurological:     Mental Status: He is alert.         Results for orders placed or performed in visit on 07/01/22  POCT INR  Result Value Ref Range   INR 1.7 (A) 2.0 - 3.0    Assessment & Plan:   Problem List Items Addressed This Visit     Obstructive sleep apnea    Pending sleep study and f/u with pulm - states he's in communication with sleep lab.       Mass of subcutaneous tissue of back    Presumed sebaceous cyst by prior CT scans.  Becoming more painful - will refer to gen surg for possible removal.       Relevant Orders   Ambulatory referral to General Surgery   Chronic diastolic heart failure (HCC)   Iron deficiency anemia    Sees heme, has seen GI, declined rpt capsule endoscopy.  Continues coumadin, pending rpt iron infusion.       Skin rash - Primary    Most consistent  with contact dermatitis. Denies poison ivy exposure. Has had fiberglass exposure. Pura Spice has been most effective. Rx triamcinolone cream with topical steroid precautions. Update if not improved with this.       Atrial fibrillation (HCC)   Long-term (current) use of anticoagulants, INR goal 2.0-3.0   Pain in left elbow    Persistent pain since one of his falls over fall 2023.  ROM preserved however has reproducible tenderness over olecranon.  Not consistent with olecranon bursitis. Check elbow films today.       Relevant Orders   DG Elbow Complete Left     Meds ordered this encounter  Medications   triamcinolone cream (KENALOG) 0.1 %    Sig: Apply 1 Application topically 2 (two) times daily. For max 2 weeks at a time    Dispense:  45 g    Refill:  0   methocarbamol (ROBAXIN) 500 MG tablet    Sig: Take 1 tablet (500 mg total) by mouth 3 (three) times daily as needed for muscle spasms (sedation precautions).    Dispense:  30 tablet    Refill:  0    Orders Placed This Encounter  Procedures   DG Elbow Complete Left    Standing Status:   Future    Number of Occurrences:   1    Standing Expiration Date:   07/02/2023    Order Specific Question:   Reason for Exam (SYMPTOM  OR DIAGNOSIS REQUIRED)    Answer:   L elbow pain at olecranon for months after fall, preserved ROM    Order Specific Question:   Preferred imaging location?    Answer:   [facility]   Ambulatory referral to General Surgery    Referral Priority:   Routine    Referral Type:   Surgical    Referral Reason:  Specialty Services Required    Requested Specialty:   General Surgery    Number of Visits Requested:   1    Patient Instructions  Call [facility] office to schedule appointment [phone]  May use robaxin '500mg'$  twice daily as needed for muscle relaxation. Don't take and drive.  For elbow - check xray today  For skin rash - suspicious for contact dermatitis. Treat with stronger steroid  cream sent to pharmacy.   Follow up plan: No follow-ups on file.  [name], MD

## 2022-07-05 ENCOUNTER — Ambulatory Visit: Payer: Medicare Other

## 2022-07-05 ENCOUNTER — Other Ambulatory Visit: Payer: Self-pay | Admitting: Family Medicine

## 2022-07-05 ENCOUNTER — Inpatient Hospital Stay: Payer: Medicare Other | Attending: Oncology

## 2022-07-05 VITALS — BP 127/76 | HR 64 | Temp 97.4°F | Resp 18

## 2022-07-05 DIAGNOSIS — I25119 Atherosclerotic heart disease of native coronary artery with unspecified angina pectoris: Secondary | ICD-10-CM | POA: Diagnosis not present

## 2022-07-05 DIAGNOSIS — F32A Depression, unspecified: Secondary | ICD-10-CM | POA: Diagnosis not present

## 2022-07-05 DIAGNOSIS — E876 Hypokalemia: Secondary | ICD-10-CM | POA: Diagnosis not present

## 2022-07-05 DIAGNOSIS — I4891 Unspecified atrial fibrillation: Secondary | ICD-10-CM | POA: Diagnosis not present

## 2022-07-05 DIAGNOSIS — I110 Hypertensive heart disease with heart failure: Secondary | ICD-10-CM | POA: Diagnosis not present

## 2022-07-05 DIAGNOSIS — D508 Other iron deficiency anemias: Secondary | ICD-10-CM

## 2022-07-05 DIAGNOSIS — Z7984 Long term (current) use of oral hypoglycemic drugs: Secondary | ICD-10-CM | POA: Diagnosis not present

## 2022-07-05 DIAGNOSIS — K219 Gastro-esophageal reflux disease without esophagitis: Secondary | ICD-10-CM | POA: Diagnosis not present

## 2022-07-05 DIAGNOSIS — D509 Iron deficiency anemia, unspecified: Secondary | ICD-10-CM | POA: Diagnosis not present

## 2022-07-05 DIAGNOSIS — J449 Chronic obstructive pulmonary disease, unspecified: Secondary | ICD-10-CM | POA: Diagnosis not present

## 2022-07-05 DIAGNOSIS — Z87891 Personal history of nicotine dependence: Secondary | ICD-10-CM | POA: Diagnosis not present

## 2022-07-05 DIAGNOSIS — I252 Old myocardial infarction: Secondary | ICD-10-CM | POA: Diagnosis not present

## 2022-07-05 DIAGNOSIS — Z9981 Dependence on supplemental oxygen: Secondary | ICD-10-CM | POA: Diagnosis not present

## 2022-07-05 DIAGNOSIS — E785 Hyperlipidemia, unspecified: Secondary | ICD-10-CM | POA: Diagnosis not present

## 2022-07-05 DIAGNOSIS — M1990 Unspecified osteoarthritis, unspecified site: Secondary | ICD-10-CM | POA: Diagnosis not present

## 2022-07-05 DIAGNOSIS — I420 Dilated cardiomyopathy: Secondary | ICD-10-CM | POA: Diagnosis not present

## 2022-07-05 DIAGNOSIS — Z7901 Long term (current) use of anticoagulants: Secondary | ICD-10-CM | POA: Diagnosis not present

## 2022-07-05 DIAGNOSIS — I872 Venous insufficiency (chronic) (peripheral): Secondary | ICD-10-CM | POA: Diagnosis not present

## 2022-07-05 DIAGNOSIS — Z9181 History of falling: Secondary | ICD-10-CM | POA: Diagnosis not present

## 2022-07-05 DIAGNOSIS — E1165 Type 2 diabetes mellitus with hyperglycemia: Secondary | ICD-10-CM | POA: Diagnosis not present

## 2022-07-05 DIAGNOSIS — I5042 Chronic combined systolic (congestive) and diastolic (congestive) heart failure: Secondary | ICD-10-CM | POA: Diagnosis not present

## 2022-07-05 DIAGNOSIS — Z5181 Encounter for therapeutic drug level monitoring: Secondary | ICD-10-CM | POA: Diagnosis not present

## 2022-07-05 DIAGNOSIS — J432 Centrilobular emphysema: Secondary | ICD-10-CM

## 2022-07-05 DIAGNOSIS — G4733 Obstructive sleep apnea (adult) (pediatric): Secondary | ICD-10-CM | POA: Diagnosis not present

## 2022-07-05 DIAGNOSIS — G43909 Migraine, unspecified, not intractable, without status migrainosus: Secondary | ICD-10-CM | POA: Diagnosis not present

## 2022-07-05 MED ORDER — SODIUM CHLORIDE 0.9 % IV SOLN
510.0000 mg | INTRAVENOUS | Status: DC
  Administered 2022-07-05: 510 mg via INTRAVENOUS
  Filled 2022-07-05: qty 510

## 2022-07-05 MED ORDER — SODIUM CHLORIDE 0.9 % IV SOLN
INTRAVENOUS | Status: DC
  Filled 2022-07-05: qty 250

## 2022-07-05 NOTE — Patient Instructions (Signed)

## 2022-07-07 ENCOUNTER — Ambulatory Visit: Payer: Medicare Other

## 2022-07-08 ENCOUNTER — Telehealth: Payer: Self-pay

## 2022-07-08 NOTE — Progress Notes (Signed)
Care Management & Coordination Services Pharmacy Team  Reason for Encounter: Appointment Reminder  Contacted patient to confirm telephone appointment with [name], PharmD on 07/13/2022 at 11:00.  Unsuccessful outreach. Left voicemail for patient to return call.  Star Rating Drugs:  Medication:  Last Fill: Day Supply Glimepiride 4 mg 05/17/2022 90 Metformin 1000 mg 04/22/2022 90 Simvastatin 20 mg 05/18/2022 90  Care Gaps: Annual wellness visit in last year? Yes 05/03/2022  If Diabetic: Last eye exam / retinopathy screening: Overdue Last diabetic foot exam: Up to date  [name], PharmD notified  Marijean Niemann, [place] Assistant [phone]

## 2022-07-12 ENCOUNTER — Ambulatory Visit: Payer: Medicare Other

## 2022-07-12 ENCOUNTER — Inpatient Hospital Stay: Payer: Medicare Other

## 2022-07-12 ENCOUNTER — Other Ambulatory Visit: Payer: Self-pay | Admitting: Family Medicine

## 2022-07-12 VITALS — BP 108/73 | HR 63 | Temp 97.9°F

## 2022-07-12 DIAGNOSIS — D508 Other iron deficiency anemias: Secondary | ICD-10-CM

## 2022-07-12 DIAGNOSIS — D509 Iron deficiency anemia, unspecified: Secondary | ICD-10-CM | POA: Diagnosis not present

## 2022-07-12 MED ORDER — SODIUM CHLORIDE 0.9 % IV SOLN
510.0000 mg | INTRAVENOUS | Status: DC
  Administered 2022-07-12: 510 mg via INTRAVENOUS
  Filled 2022-07-12: qty 510

## 2022-07-12 MED ORDER — SODIUM CHLORIDE 0.9 % IV SOLN
INTRAVENOUS | Status: DC
  Filled 2022-07-12: qty 250

## 2022-07-12 NOTE — Patient Instructions (Signed)

## 2022-07-13 ENCOUNTER — Other Ambulatory Visit: Payer: Self-pay | Admitting: Family Medicine

## 2022-07-13 ENCOUNTER — Ambulatory Visit: Payer: Medicare Other | Admitting: Pharmacist

## 2022-07-13 DIAGNOSIS — H401123 Primary open-angle glaucoma, left eye, severe stage: Secondary | ICD-10-CM | POA: Diagnosis not present

## 2022-07-13 DIAGNOSIS — H401111 Primary open-angle glaucoma, right eye, mild stage: Secondary | ICD-10-CM | POA: Diagnosis not present

## 2022-07-13 DIAGNOSIS — R140 Abdominal distension (gaseous): Secondary | ICD-10-CM

## 2022-07-13 NOTE — Patient Instructions (Signed)
Visit Information  Phone number for Pharmacist: [phone]  Thank you for meeting with me to discuss your medications! Below is a summary of what we talked about during the visit:   Recommendations/Changes made from today's visit: -Continue weighing daily; contact cardiology with weight gain 3+ lbs in 1 day, 5+ lbs in 1 week -Advised to f/u with hematology as scheduled, discuss ongoing oral iron (Fusion+) in s/o iron infusions -Recheck A1c after 07/20/22  Follow up plan: -Health Concierge will call patient 1 month for DM update -Pharmacist follow up televisit scheduled for 2 months -Surgery appt 07/14/22; PFT appt 07/20/22; hematology lab 08/23/22; hematology appt 10/22/22   [name], PharmD, BCACP Clinical Pharmacist [facility] [phone]

## 2022-07-13 NOTE — Telephone Encounter (Signed)
Change in therapy. Dose changed to 6.25 mg BID (see 02/19/22 OV notes). Rx sent 04/30/22, #180/0 to [facility].   Request denied.

## 2022-07-13 NOTE — Progress Notes (Signed)
Care Management & Coordination Services Pharmacy Note  07/13/2022 Name:  [name] MRN:  [id number] DOB:  [birth year]  Summary: F/U visit -HF: pt reports weighing himself daily and wt is stable ~233 lbs; he reports ongoing dizziness, "feeling woozy", occurs all the time, cannot identify trigger (ie standing up fast); concern for orthostasis -Anemia: s/p blood transfusions 01/2022, 03/2022 and iron infusions x 4 (03/2022 - 06/2022); HGB was 10.6, TSAT 8 prior to most recent iron infusions, repeat labwork scheduled for 4/29 -DM: A1c 6.9% (03/2022) improved from >10% with lifestyle modifications and addition of Farxiga; however of note A1c is unreliable for ~3 months following blood transfusion (RBC transfusion 04/09/22, A1c 04/20/22) and often falsely low in patients with previously higher A1c -COPD/OSA: pt reports compliance with Breztri and denies SOB; pt has seen pulmonology and they ordered PFT and sleep study; PFTs have been scheduled (3/26) and pt has declined further workup for OSA/CPAP/BIPAP  Recommendations/Changes made from today's visit: -Advised to monitor BP when he feels dizzy; contact provider if BP < 110/60 and feeling dizzy -Continue weighing daily; contact cardiology with weight gain 3+ lbs in 1 day, 5+ lbs in 1 week -Advised to f/u with hematology as scheduled, discuss ongoing oral iron (Fusion+) in s/o iron infusions -Recheck A1c after 07/20/22  Follow up plan: -Health Concierge will call patient 1 month for DM update -Pharmacist follow up televisit scheduled for 2 months -Surgery appt 07/14/22; PFT appt 07/20/22; hematology lab 08/23/22; hematology appt 10/22/22     Subjective: [name] is an 67 y.o. year old male who is a primary patient of [name], MD.  The care coordination team was consulted for assistance with disease management and care coordination needs.    Engaged with patient by telephone for follow up visit. Patient lives at home alone  with his dog. He does not have any family nearby, he has one friend who will occasionally help him.  Recent office visits: 07/02/22 Dr Danise Mina OV: skin rash - rx triamcinolone cream. Back pain - rx methocarbambol. Rec voltaren gel for elbow pain.  06/14/22 Dr Danise Mina OV: f/u- pt cancelled capsule endoscopy - still unclear cause of anemia. Last iron infusion 1/18. He restarted warfarin. Declined further eval for OSA (CPAP or BiPAP). Refer to surgery for lower back mass.  05/03/22 Dr Danise Mina OV: annual - Prevnar 20 given. Check about Shingrix and RSV. Repeat CBC in 2 weeks.  04/20/22 Dr Danise Mina OV: ED f/u - tolerating fusion plus. Continue holding warfarin until GI workup complete.  04/03/23 Dr Danise Mina OV: f/u - mobility assessment. Ordered power scooter. HGB 8.4. Advised to hold warfarin until GI eval - start aspirin in meantime. Continue pantoprazole BID.  03/30/23 Dr Danise Mina OV: hospital f/u - 20# wt loss in past 6 weeks. F/u with pulm for sleep study and PFTs. Start oral iron MWF, may need iron infusion, f/u with heme. Drop metolazone to once weekly.  02/19/22 Dr Danise Mina OV: call pulm for scheduling sLeep study, PFTs. Significant diuresis in hospital ~30 lbs, now 50# down. Anxiety worsening - change to fluoxetine 40 mg. Rx written for portable oxygen concentrator. Referred for eye exam. Stop sea moss, super beet, turmeric. HGB 7.1 - advised ER evaluation for blood transfusion.   01/12/22 Dr Danise Mina OV: hosp f/u - continue lower dose of carvedilol. Farxiga through AZ&Me. F/u 2 weeks.  Recent consult visits: 05/21/22 NP [facility] (Cardiology): HF - wt down 8 lbs from 5 weeks ago. No changes.  04/23/22 Dr Janese Banks (Hematology):  IDA - needs IV iron.   04/23/22 PA Albertine Patricia [facility] Cardiology): f/u - no changes.  04/09/22 NP [facility] (Cardiology): f/u - continue weighing daily. Clear out voicemail.  04/08/22 Dr Marius Ditch (GI): anemia - schedule EGD, colonoscopy. Trial Fusion iron pill.  02/25/22  NP Darylene Price (Cardiology): HF - Increase Farxiga to 10 mg. Updated Kcl to extra 1 tablet with metolazone.  02/15/22 Dr Nehemiah Massed (Cardiology): decrease amiodarone to 200 mg 01/18/22 Dr Genia Harold [facility]): consult SOB. Order PFT and split night study to confirm OSA. Initiate BiPAP. RTC 3 months. 01/15/22 NP Darylene Price (HF clinic): continue daily weights. Drink 60 oz fluid. Clean out VM.  Hospital visits: 04/09/22 ED visit [facility]): GI hemorrhage, anemia. Recommend admission with positive hemoccult. Pt unable to stay as no one can watch his dog. Given 2 u PRBC and PPI and pt signed AMA form.  02/23/22 ED visit [facility]): symptomatic anemia - blood transfusion.   01/03/22 - 01/05/22 Admission [facility]): Acute CHF, sepsis (cellulitis of b/l LE), COPD exacerbation.  Discharged on Bactrim, prednisone. Reduce carvedilol d/t soft BP. D/C on furosemide, not torsemide. Consider ACE/ARB - outpatient. D/c with home health.   12/28/21 ED visit [facility]): chest wall pain, multiple falls, CHF exacerbation. CT head negative. IV lasix given.   Objective:  Lab Results  Component Value Date   CREATININE 1.15 05/03/2022   BUN 12 05/03/2022   GFR 66.26 05/03/2022   GFRNONAA >60 04/09/2022   GFRAA >60 12/22/2018   NA 143 05/03/2022   K 4.0 05/03/2022   CALCIUM 9.2 05/03/2022   CO2 37 (H) 05/03/2022   GLUCOSE 153 (H) 05/03/2022    Lab Results  Component Value Date/Time   HGBA1C 6.9 (H) 04/20/2022 12:36 PM   HGBA1C 8.6 (H) 01/03/2022 02:41 PM   HGBA1C 7.0 (H) 01/24/2013 04:07 AM   GFR 66.26 05/03/2022 04:04 PM   GFR 77.44 04/27/2022 09:02 AM   MICROALBUR 5.4 (H) 07/15/2021 09:12 AM   MICROALBUR <0.7 07/09/2020 08:22 AM    Last diabetic Eye exam:  Lab Results  Component Value Date/Time   HMDIABEYEEXA No Retinopathy 06/17/2020 12:00 AM    Last diabetic Foot exam: No results found for: "HMDIABFOOTEX"   Lab Results  Component Value Date   CHOL 104 01/04/2022   HDL 42 01/04/2022   LDLCALC 50 01/04/2022    LDLDIRECT 97.0 07/09/2020   TRIG 59 01/04/2022   CHOLHDL 2.5 01/04/2022       Latest Ref Rng & Units 02/22/2022    4:15 PM 01/12/2022   10:27 AM 01/03/2022    9:59 AM  Hepatic Function  Total Protein 6.5 - 8.1 g/dL 6.9  6.8  7.2   Albumin 3.5 - 5.0 g/dL 3.7  3.8  3.9   AST 15 - 41 U/L 26  28  34   ALT 0 - 44 U/L 17  32  17   Alk Phosphatase 38 - 126 U/L 54  109  71   Total Bilirubin 0.3 - 1.2 mg/dL 0.7  0.6  1.2     Lab Results  Component Value Date/Time   TSH 2.81 07/15/2021 09:12 AM   TSH 2.63 10/02/2019 12:05 PM       Latest Ref Rng & Units 06/24/2022   11:03 AM 05/19/2022    9:14 AM 05/03/2022    4:04 PM  CBC  WBC 4.0 - 10.5 K/uL 10.9  11.4  11.5   Hemoglobin 13.0 - 17.0 g/dL 10.6  10.2  9.6   Hematocrit 39.0 -  52.0 % 34.6  32.7  31.2   Platelets 150 - 400 K/uL 245  329.0  213.0    Iron/TIBC/Ferritin/ %Sat    Component Value Date/Time   IRON 30 (L) 06/24/2022 1103   TIBC 392 06/24/2022 1103   FERRITIN 12 (L) 06/24/2022 1103   IRONPCTSAT 8 (L) 06/24/2022 1103   Lab Results  Component Value Date/Time   INR 1.7 (A) 07/01/2022 12:00 AM   INR 2.7 06/25/2022 12:00 AM   INR 1.0 04/27/2022 09:02 AM   INR 1.0 04/09/2022 12:51 PM   INR 1.8 08/27/2011 12:12 PM    Lab Results  Component Value Date/Time   VD25OH 44.82 07/15/2021 09:12 AM   VD25OH 29.46 (L) 07/09/2020 07:57 AM   VITAMINB12 699 02/23/2022 06:13 AM   VITAMINB12 >1504 (H) 07/15/2021 09:12 AM    Clinical ASCVD: Yes  The ASCVD Risk score (Arnett DK, et al., 2019) failed to calculate for the following reasons:   The patient has a prior MI or stroke diagnosis    CHA2DS2/VAS Stroke Risk Points  Current as of 7 hours ago     5 >= 2 Points: High Risk    Points Metrics  1 Has Congestive Heart Failure:  Yes    Current as of 7 hours ago  1 Has Vascular Disease:  Yes    Current as of 7 hours ago  1 Has Hypertension:  Yes    Current as of 7 hours ago  1 Age:  67    Current as of 7 hours ago  1 Has  Diabetes:  Yes    Current as of 7 hours ago  0 Had Stroke:  No  Had TIA:  No  Had Thromboembolism:  No    Current as of 7 hours ago  0 Male:  No    Current as of 7 hours ago        07/02/2022   11:53 AM 05/03/2022    3:23 PM 01/15/2022   12:10 PM  Depression screen PHQ 2/9  Decreased Interest 0 0 0  Down, Depressed, Hopeless 0 0 0  PHQ - 2 Score 0 0 0  Altered sleeping 3    Tired, decreased energy 3    Change in appetite 1    Feeling bad or failure about yourself  0    Trouble concentrating 0    Moving slowly or fidgety/restless 0    Suicidal thoughts 0    PHQ-9 Score 7    Difficult doing work/chores Somewhat difficult         07/02/2022   11:53 AM 01/15/2022   12:10 PM 09/08/2021   10:46 AM 07/20/2021    4:51 PM  GAD 7 : Generalized Anxiety Score  Nervous, Anxious, on Edge 1 2 3 2   Control/stop worrying 0 2 1 1   Worry too much - different things 1 2 1 1   Trouble relaxing 0 2 0 1  Restless 0 0 0 0  Easily annoyed or irritable 0 0 0 0  Afraid - awful might happen 0 0 0 0  Total GAD 7 Score 2 8 5 5   Anxiety Difficulty Somewhat difficult Somewhat difficult Somewhat difficult     Social History   Tobacco Use  Smoking Status Former   Packs/day: 1.00   Years: 31.00   Additional pack years: 0.00   Total pack years: 31.00   Types: Cigarettes   Quit date: 04/26/2018   Years since quitting: 4.2  Smokeless Tobacco Never  BP Readings from Last 3 Encounters:  07/12/22 108/73  07/05/22 127/76  07/02/22 134/72   Pulse Readings from Last 3 Encounters:  07/12/22 63  07/05/22 64  07/02/22 66   Wt Readings from Last 3 Encounters:  07/02/22 233 lb 2 oz (105.7 kg)  06/14/22 228 lb 2 oz (103.5 kg)  05/21/22 237 lb 2 oz (107.6 kg)   BMI Readings from Last 3 Encounters:  07/02/22 32.74 kg/m  06/14/22 32.04 kg/m  05/21/22 33.31 kg/m    Allergies  Allergen Reactions   Atorvastatin Other (See Comments)    Dizziness   Lisinopril Cough    Medications Reviewed Today      Reviewed by [name], MD (Physician) on 07/02/22 at 1128  Med List Status: <None>   Medication Order Taking? Sig Documenting Provider Last Dose Status Informant  ACCU-CHEK FASTCLIX LANCETS [place] [id number]  Check blood sugar once daily and as instructed. Dx 250.00 [name], MD  Active Self  albuterol (VENTOLIN HFA) 108 (90 Base) MCG/ACT inhaler [id number]  Inhale 2 puffs into the lungs every 6 (six) hours as needed for wheezing or shortness of breath. [name], MD  Active Self  amiodarone (PACERONE) 200 MG tablet [id number]  Take 1 tablet (200 mg total) by mouth daily. Vickie Epley, MD  Active Self  aspirin EC 81 MG tablet [id number]  Take 1 tablet (81 mg total) by mouth daily. Swallow whole. [name], MD  Active   Blood Glucose Monitoring Suppl (BLOOD GLUCOSE MONITOR SYSTEM) W/DEVICE KIT [id number]  by Does not apply route. Use to check sugar once daily and as needed Dx: E11.9 **ONE TOUCH VERIO** [provider]  Active Self  Budeson-Glycopyrrol-Formoterol (BREZTRI AEROSPHERE) 160-9-4.8 MCG/ACT AERO [id number]  Inhale 2 puffs into the lungs 2 (two) times daily. [name], MD  Active Self  carvedilol (COREG) 6.25 MG tablet [id number]  TAKE 1 TABLET BY MOUTH TWICE A DAY WITH FOOD [name], MD  Active   Cholecalciferol (VITAMIN D) 50 MCG (2000 UT) CAPS [id number]  Take 1 capsule (2,000 Units total) by mouth daily. [name], MD  Active Self  dapagliflozin propanediol (FARXIGA) 10 MG TABS tablet [id number]  Take 1 tablet (10 mg total) by mouth daily before breakfast. Alisa Graff, FNP  Active   Docusate Calcium (STOOL SOFTENER PO) [id number]  Take 1 tablet by mouth daily. [provider]  Active Self  Fe Fum-Fe Poly-Vit C-Lactobac (FUSION PO) [id number]  Take by mouth. Patient states received sample.  Unsure of mg. [provider]  Active Self  FLUoxetine (PROZAC) 40 MG capsule [id number]  Take 1 capsule (40 mg total)  by mouth daily. [name], MD  Active Self  fluticasone [facility]) 50 MCG/ACT nasal spray [id number]  Place 2 sprays into both nostrils daily. [name], MD  Active Self           Med Note Loann Quill   Fri Apr 09, 2022  1:23 PM)    furosemide (LASIX) 80 MG tablet [id number]  TAKE 1 TABLET BY MOUTH 2 TIMES DAILY. [name], MD  Active   glimepiride (AMARYL) 4 MG tablet [id number]  Take 1 tablet (4 mg total) by mouth daily with breakfast. [name], MD  Active Self  glucose blood [facility] VERIO) test strip [id number]  Check blood sugar 3 times a day [name], MD  Active Self  ipratropium-albuterol (DUONEB) 0.5-2.5 (3) MG/3ML SOLN [id number]  TAKE 3 MLS BY NEBULIZATION EVERY 6 (SIX) HOURS AS NEEDED (SEVERE SHORTNESS  OF BREATH/WHEEZING). [name], MD  Active   metFORMIN (GLUCOPHAGE) 1000 MG tablet [id number]  Take 1 tablet (1,000 mg total) by mouth 2 (two) times daily with a meal. [name], MD  Active Self  metolazone (ZAROXOLYN) 2.5 MG tablet [id number]  Take 1 tablet (2.5 mg total) by mouth once a week. [name], MD  Active   montelukast (SINGULAIR) 10 MG tablet [id number]  TAKE 1 TABLET BY MOUTH EVERYDAY AT BEDTIME [name], MD  Active Self  OVER THE COUNTER MEDICATION [id number]  Take 1 tablet by mouth daily. Superbeets [provider]  Active            Med Note [facility], [place]   Fri May 21, 2022 11:52 AM)    OVER THE COUNTER MEDICATION [id number]  Take 1 capsule by mouth daily at 6 (six) AM. [place] sea moss [provider]  Active   pantoprazole (PROTONIX) 40 MG tablet [id number]  TAKE 1 TABLET (40 MG TOTAL) BY MOUTH TWICE A DAY BEFORE MEALS [name], MD  Active   potassium chloride (KLOR-CON M) 10 MEQ tablet [id number]  Take 1 tablet (10 mEq total) by mouth daily. And an extra one on the days you take metolazone Alisa Graff, FNP  Active   Probiotic Product (PROBIOTIC DAILY PO) [id number]   Take 1 tablet by mouth daily. [provider]  Active Self  Saw Palmetto 450 MG CAPS [id number]  Take 1 capsule (450 mg total) by mouth daily. [name], MD  Active   simvastatin (ZOCOR) 20 MG tablet [id number]  TAKE 1 TABLET BY MOUTH EVERY DAY IN THE Recardo Evangelist, MD  Active   Turmeric (QC TUMERIC COMPLEX PO) [id number]  Take by mouth. [provider]  Active   vitamin B-12 (CYANOCOBALAMIN) 1000 MCG tablet [id number]  Take 1 tablet (1,000 mcg total) by mouth every Monday, Wednesday, and Friday.  Patient taking differently: Take 1,000 mcg by mouth daily.   [name], MD  Active Self  warfarin (COUMADIN) 5 MG tablet [id number]  TAKE 1/2 TABLET BY MOUTH DAILY EXCEPT TAKE NO COUMADIN ON SUNDAY OR AS DIRECTED BY ANTICOAGULATION CLINIC  Patient taking differently: Take 2.5 mg by mouth daily at 6 (six) AM. TAKE 1/2 TABLET BY MOUTH DAILY   [name], MD  Active            Med Note Ouida Sills, RAYBORN MAAT   Fri May 21, 2022 11:50 AM) INR was low so will increase this week but after that goes to 2.5 mg daily  Med List Note Chauncey Fischer, RN 01/05/18 1237): UDS 01/05/18            SDOH:  (Social Determinants of Health) assessments and interventions performed: No SDOH Interventions    Flowsheet Row Telephone from 03/05/2022 in [place] Management from 10/20/2021 in [place] at [place] Visit from 07/20/2021 in [place] at [place] from 07/09/2020 in [place] at [place] Interventions Intervention Not Indicated -- -- --  Housing Interventions Intervention Not Indicated -- -- --  Transportation Interventions Other (Comment)  [patient was referred to community resource guide 01/07/22. He was contacted and provided transportation resource information.] -- -- --   Depression Interventions/Treatment  -- -- Medication PHQ2-9 Score <4 Follow-up Not Indicated  Financial Strain Interventions -- Other (Comment)  [AZ&Me PAP] -- --  Medication Assistance:  Pilar Grammes- AZ&Me approved 2024  Medication Access: Within the past 30 days, how often has patient missed a dose of medication? 0 Is a pillbox or other method used to improve adherence? Yes  Factors that may affect medication adherence? financial need Are meds synced by current pharmacy? No  Are meds delivered by current pharmacy? No  Does patient experience delays in picking up medications due to transportation concerns? No   [facility] Services Reviewed: Is patient disadvantaged to use [facility]?: No  Current Rx insurance plan: [employer] Name and location of Current pharmacy:  [id number] [place], [place] [address] Phone: [phone] Fax: [place], [place] [address] [place] [address] Phone: [phone] Fax: [phone]  [facility] services reviewed with patient today?: No  Patient requests to transfer care to [facility]?: No  Reason patient declined to change pharmacies: Not mentioned at this visit  Compliance/Adherence/Medication fill history: Care Gaps: Foot exam (due 05/2017)  Star-Rating Drugs: Metformin - PDC 100% Glimepiride - PDC 100% Simvastatin - PDC 81% Farxiga - PAP   ASSESSMENT / PLAN  Hypertension / Heart Failure (BP goal <130/80) -Query controlled - home weights are stable, home BP is not known; he reports ongoing dizziness, "feeling woozy" -Last ejection fraction: 60-65% (Date: 10/2021) -HF type: HFpEF (EF > 50%); NYHA Class: II/III -AHA HF Stage: C (Heart disease and symptoms present) -Follows with cardiology (Dr Nehemiah Massed, Dr Quentin Ore) and HF clinic (NP Darylene Price) -Daily weights this week: 233 lbs -Current  treatment: Carvedilol 6.25 mg BID - Appropriate, Effective, Safe, Accessible Furosemide 80 mg BID - Appropriate, Effective, Safe, Accessible Metolazone 2.5 mg weekly -Appropriate, Effective, Safe, Accessible Potassium chloride 10 mEq daily + extra tab with metolazone - Appropriate, Effective, Safe, Accessible Farxiga 10 mg daily - Appropriate, Effective, Safe, Accessible -Medications previously tried: losartan, metoprolol, torsemide -Reviewed importance of weighing daily to monitor fluid status; advised to contact cardiology with wt gain of 3+ lbs in 1 day or 5+ lbs in a week -Recommended to continue current medication  Atrial Fibrillation (Goal: prevent stroke and major bleeding) -Controlled -Follows with cardiology (Dr Nehemiah Massed, Dr Quentin Ore) -Pt switched Eliquis to warfarin Fall 2023 due to cost -Dx 04/2021; Cardioversion 07/2021; CHADSVASC: 5 -Previous ? GI bleed 03/2022 (positive hemoccult, subsequent colonoscopy and EGD were normal, except for 10 precancerous polyps removed, pt declined capsule study so no source of bleeding was identified) -Current treatment: Carvedilol 6.25 mg BID - Appropriate, Effective, Safe, Accessible Amiodarone 200 mg daily -Appropriate, Effective, Safe, Accessible Warfarin 5 mg - 1/2 tab daily except 1 tab MWF -Appropriate, Effective, Query Safe -Medications previously tried: Eliquis (cost) -Reviewed long term safety of warfarin - pt has struggled with getting INR in therapeutic range, with frequent fluid overload in s/o of acute HF; pt would probably do better with DOAC but could not afford Eliquis in donut hole -Recommended to continue current medication; re-evaluate possibility of DOAC in 2024  Iron deficiency Anemia  -Follows with GI, hematology. Workup for cause of GI bleeding incomplete, no etiology found (colonoscopy, EGD unremarkable and pt declined capsule study) -Hx: s/p 2u PRBC transfusion 01/2022 and 03/2022, started iron infusion 03/2022 (last  07/12/22) -Current treatment  Vitamin B12 1000 mcg MWF - Appropriate, Effective, Safe, Accessible Fusion plus - Appropriate, Query Effective Iron infusions (04/23/22, 05/13/22, 07/05/22, 07/12/22) -Medications previously tried: oral iron (GI upset)  -Reviewed role  of oral iron when he is received iron infusions is unclear; advised he discuss ongoing oral iron replacement with hematologist Dr Janese Banks  Hyperlipidemia / CAD (LDL goal < 17) -Controlled - LDL 50 (12/2021) at goal -Hx CAD (3v disease on CT scan); also AAA; no aspirin due to warfarin -Current treatment: Simvastatin 20 mg daily HS - Appropriate, Effective, Safe, Accessible -Medications previously tried: n/a  -Educated on Cholesterol goals; Benefits of statin for ASCVD risk reduction; -Drug interaction (Category D) interaction with amiodarone - can increase concentrations of simvastatin; this is reasonable given relatively low dose of simvastatin and lack of side effects reported by patient -Recommended to continue current medication  Diabetes (A1c goal <7%) -Controlled - A1c 6.9% (03/2022); pt reports he "changed his life" not just diet, he has cut out high carb items and eats smaller portions, he has lost over 80 lbs since last summer 2023 (though some of this was volume overload in s/o HF exacerbation) -Current home glucose readings: none available -Denies hypoglycemic/hyperglycemic symptoms -Pt does not sleep well, breakfast is sometimes 2pm -Current medications: Metformin 1000 mg BID - Appropriate, Effective, Safe, Accessible Glimepiride 4 mg daily -Appropriate, Effective, Safe, Accessible Farxiga 10 mg daily (PAP)-Appropriate, Effective, Safe, Accessible One Touch Verio supplies -Medications previously tried: [place] (cost), Januvia  -Educated on A1c and blood sugar goals; Complications of diabetes including kidney damage, retinal damage, and cardiovascular disease; Benefits of routine self-monitoring of blood sugar; -Reviewed  accuracy of A1c in s/o recent PRBC transfusions (transfusion 04/09/22, A1c 04/20/22) - A1c is unreliable for 3 months following a transfusion, query accuracy of latest A1c -Recommended to continue current medication; repeat A1c after 07/20/22  COPD (Goal: control symptoms and prevent exacerbations) -Controlled - pt reports Judithann Sauger works as well as previous combination of Incruse + Wixela -Re-established with pulmonology 12/2021 - ordered PFTs and sleep study and started BiPAP in interim; pt has PFT scheduled 07/20/22 and declined to rescheduled sleep study -Gold Grade: Gold 3 (FEV1 30-49%) -Current COPD Classification:  D (high sx, >/=2 exacerbations/yr) -Pulmonary function testing: Spirometry 05/2018: pre: FVC 54%, FEV1 51%, ratio 0.71 post: FVC 54%, FEV1 49%, ratio 0.69 -Exacerbations requiring treatment in last 6 months: 0 (last 07/2020) -Current treatment  Breztri 160-9-4.8 mcg/act 2 puff BID (PAP)- Appropriate, Effective, Safe, Accessible Atrovent (ipratropium) 2 puffs PRN - Appropriate, Effective, Safe, Accessible Albuterol HFA -Appropriate, Effective, Safe, Accessible Albuterol neb -Appropriate, Effective, Safe, Accessible Montelukast 10 mg daily HS -Appropriate, Effective, Safe, Accessible Flonase nasal spray -Appropriate, Effective, Safe, Accessible Oxygen -Medications previously tried: Trelegy, Advair, Spiriva -Patient reports consistent use of maintenance inhaler -Counseled on Proper inhaler technique; Benefits of consistent maintenance inhaler use -Recommend to continue current medication; keep f/u with pulm/PFTs  Depression/Anxiety (Goal: manage symptoms) -Improved - recently switched from citalopram to fluoxetine 01/2022, pt reports feeling a little better  -Self medicating with marijuana - has switched from smoking to edibles recently -Reports more panic attacks lately, improved somewhat since switch to fluoxetine -PHQ9: 7 (06/2022) - mild depression -GAD7: 2 (06/2022) - minimal  anxiety; hx panic attacks -Connected with PCP for mental health support -Current treatment: Fluoxetine 40 mg daily - Appropriate, Query Effective -Medications previously tried/failed: citalopram, hydroxyzine (ineffective) -Educated on Benefits of medication for symptom control -Recommended to continue current medication  OSA/Insomnia -Uncontrolled  -Hx OSA on CPAP; he is noncompliant with CPAP due to uncomfortable mask, he has not tried new mask in a few years;  -Pt has declined further workup for OSA/CPAP/BIPAP (declined to reschedule sleep study)  Other: -[facility] ended a few weeks ago. Pt reports PT was incredibly helpful. He says he has continued some exercises after [place] ended -Pt reports ongoing dizziness, "feeling woozy", occurs all the time, he does not drive due to this   [name], PharmD, [employer] Corporate treasurer Healthcare at [facility] [phone]

## 2022-07-14 ENCOUNTER — Ambulatory Visit: Payer: Medicare Other

## 2022-07-14 ENCOUNTER — Ambulatory Visit: Payer: Medicare Other | Admitting: Surgery

## 2022-07-14 ENCOUNTER — Encounter: Payer: Self-pay | Admitting: Surgery

## 2022-07-14 VITALS — BP 145/80 | HR 58 | Temp 98.2°F | Ht 71.0 in | Wt 232.0 lb

## 2022-07-14 DIAGNOSIS — L723 Sebaceous cyst: Secondary | ICD-10-CM

## 2022-07-14 DIAGNOSIS — K429 Umbilical hernia without obstruction or gangrene: Secondary | ICD-10-CM

## 2022-07-14 NOTE — H&P (View-Only) (Signed)
07/14/2022  Reason for Visit: Sebaceous cyst of the back, umbilical hernia  Requesting Provider: Javier Gutierrez, MD  History of Present Illness: [name] is a 66 y.o. male presenting for evaluation of a large sebaceous cyst of the back.  It is located in the left lower back.  The patient reports he has had this for at least 5 years.  It has grown in size and he has also lost a lot of weight intentionally which makes it protrude more.  He has also noticed a new one on his right lower back which is much smaller, but reports that the on on the left started that small.  He reports that the left sided cyst bothers him with discomfort in his back, particularly if he is bending to one side or the other.  Denies any drainage from either site and denies any episodes where the areas have become very swollen and red/tender.    He also mentions that he has an umbilical hernia.  He has had this for at least the same amount of time as the larger sebaceous cyst.  He reports very seldom episodes of pain at the umbilicus depending on his activity.  He hernia does push in, but he reports it has grown in size with time and the overlying skin is very stretched.  Denies any nausea or vomiting, chest pain or shortness of breath.  He has history of CHF, COPD, atrial fibrillation, DM, and OSA.  He is on Coumadin.  Past Medical History: Past Medical History:  Diagnosis Date   Abnormal drug screen 2015   MJ positive x3 - one more and we will stop prescribing ativan (08/2013).   Anemia    Angina    Anxiety    Arrhythmia    atrial fibrillation   Arthritis    CAD (coronary artery disease)    nonobstructive   Cannabis abuse    Chronic systolic CHF (congestive heart failure) (HCC) 2013   NYHA Class II/III   COPD (chronic obstructive pulmonary disease) (HCC)    Diabetes type 2, uncontrolled    declines DSME   Dilated cardiomyopathy (HCC) 2013   now improved   Ex-smoker    Frequent headaches    History  of atrial fibrillation 2013   chronic, s/p ablation prior on coumadin   Hyperlipidemia    Hypertension    Migraine    Narcolepsy    Nonischemic cardiomyopathy (HCC) 2013   EF 25% per Dr Kowalski   Obesity    OSA (obstructive sleep apnea)    does not use CPAP - unable to tolerate   Seasonal allergies      Past Surgical History: Past Surgical History:  Procedure Laterality Date   ATRIAL FLUTTER ABLATION N/A 09/21/2011   Procedure: ATRIAL FLUTTER ABLATION;  Surgeon: James Allred, MD;  Location: [facility];  Service: Cardiovascular;  Laterality: N/A;   CARDIAC ELECTROPHYSIOLOGY MAPPING AND ABLATION  2013   for atrial flutter   CARDIOVASCULAR STRESS TEST  03/2012   ETT WNL (Kowalski)   CARDIOVERSION N/A 08/12/2021   Procedure: CARDIOVERSION;  Surgeon: Kowalski, Bruce J, MD;  Location: [facility] ORS;  Service: Cardiovascular;  Laterality: N/A;   COLONOSCOPY WITH PROPOFOL N/A 05/22/2020   TA, diverticulosis, descending colon ulcer biopsy WNL, int hem (Wohl, Darren, MD)   COLONOSCOPY WITH PROPOFOL N/A 04/28/2022   multiple TAs - 10 small, 2 1.2cm, 2 9mm, rpt 3 yrs (Vanga, Rohini Reddy, MD)   ESOPHAGOGASTRODUODENOSCOPY (EGD) WITH PROPOFOL N/A 05/22/2020     WNL (Wohl)   ESOPHAGOGASTRODUODENOSCOPY (EGD) WITH PROPOFOL N/A 04/28/2022   12mm duodenal TA, reactive gastropathy, enteric mucosa in stomach biopsy of unclear significance (Vanga, Rohini Reddy, MD)   GIVENS CAPSULE STUDY  04/28/2022   Procedure: GIVENS CAPSULE STUDY;  Surgeon: Vanga, Rohini Reddy, MD;  Location: [facility];  Service: Endoscopy;;   US ECHOCARDIOGRAPHY  2014   EF 50%, nl LV fxn, RV nl size/function, mild mitral insuff    Home Medications: Prior to Admission medications   Medication Sig Start Date End Date Taking? Authorizing Provider  ACCU-CHEK FASTCLIX LANCETS MISC Check blood sugar once daily and as instructed. Dx 250.00 05/10/13  Yes Gutierrez, Javier, MD  albuterol (VENTOLIN HFA) 108 (90 Base) MCG/ACT inhaler  Inhale 2 puffs into the lungs every 6 (six) hours as needed for wheezing or shortness of breath. 01/12/22  Yes Gutierrez, Javier, MD  amiodarone (PACERONE) 200 MG tablet Take 1 tablet (200 mg total) by mouth daily. 09/16/21  Yes Lambert, Cameron T, MD  Blood Glucose Monitoring Suppl (BLOOD GLUCOSE MONITOR SYSTEM) W/DEVICE KIT by Does not apply route. Use to check sugar once daily and as needed Dx: E11.9 **ONE TOUCH VERIO**   Yes [provider]  Budeson-Glycopyrrol-Formoterol (BREZTRI AEROSPHERE) 160-9-4.8 MCG/ACT AERO Inhale 2 puffs into the lungs 2 (two) times daily. 10/23/21  Yes Gutierrez, Javier, MD  carvedilol (COREG) 6.25 MG tablet TAKE 1 TABLET BY MOUTH TWICE A DAY WITH FOOD 04/30/22  Yes Gutierrez, Javier, MD  Cholecalciferol (VITAMIN D) 50 MCG (2000 UT) CAPS Take 1 capsule (2,000 Units total) by mouth daily. 02/05/19  Yes Gutierrez, Javier, MD  dapagliflozin propanediol (FARXIGA) 10 MG TABS tablet Take 1 tablet (10 mg total) by mouth daily before breakfast. 03/01/22  Yes Hackney, Tina A, FNP  Docusate Calcium (STOOL SOFTENER PO) Take 1 tablet by mouth daily.   Yes [provider]  Fe Fum-Fe Poly-Vit C-Lactobac (FUSION PO) Take by mouth. Patient states received sample.  Unsure of mg.   Yes [provider]  FLUoxetine (PROZAC) 40 MG capsule Take 1 capsule (40 mg total) by mouth daily. 02/19/22  Yes Gutierrez, Javier, MD  fluticasone (FLONASE) 50 MCG/ACT nasal spray Place 2 sprays into both nostrils daily. 01/12/22  Yes Gutierrez, Javier, MD  furosemide (LASIX) 80 MG tablet TAKE 1 TABLET BY MOUTH 2 TIMES DAILY. 05/26/22  Yes Gutierrez, Javier, MD  glimepiride (AMARYL) 4 MG tablet Take 1 tablet (4 mg total) by mouth daily with breakfast. 01/14/22  Yes Gutierrez, Javier, MD  glucose blood (ONETOUCH VERIO) test strip Check blood sugar 3 times a day 10/22/21  Yes Gutierrez, Javier, MD  ipratropium-albuterol (DUONEB) 0.5-2.5 (3) MG/3ML SOLN TAKE 3 MLS BY NEBULIZATION EVERY 6 (SIX)  HOURS AS NEEDED (SEVERE SHORTNESS OF BREATH/WHEEZING). 05/31/22  Yes Gutierrez, Javier, MD  metFORMIN (GLUCOPHAGE) 1000 MG tablet Take 1 tablet (1,000 mg total) by mouth 2 (two) times daily with a meal. 01/14/22  Yes Gutierrez, Javier, MD  methocarbamol (ROBAXIN) 500 MG tablet Take 1 tablet (500 mg total) by mouth 3 (three) times daily as needed for muscle spasms (sedation precautions). 07/02/22  Yes Gutierrez, Javier, MD  metolazone (ZAROXOLYN) 2.5 MG tablet Take 1 tablet (2.5 mg total) by mouth once a week. 04/03/22  Yes Gutierrez, Javier, MD  montelukast (SINGULAIR) 10 MG tablet TAKE 1 TABLET BY MOUTH EVERYDAY AT BEDTIME 07/06/22  Yes Gutierrez, Javier, MD  OVER THE COUNTER MEDICATION Take 1 tablet by mouth daily. Superbeets   Yes [provider]  OVER THE COUNTER   MEDICATION Take 1 capsule by mouth daily at 6 (six) AM. Irish sea moss   Yes [provider]  pantoprazole (PROTONIX) 40 MG tablet TAKE 1 TABLET (40 MG TOTAL) BY MOUTH TWICE A DAY BEFORE MEALS 07/13/22  Yes Gutierrez, Javier, MD  potassium chloride (KLOR-CON M) 10 MEQ tablet Take 1 tablet (10 mEq total) by mouth daily. And an extra one on the days you take metolazone 02/25/22  Yes Hackney, Tina A, FNP  Probiotic Product (PROBIOTIC DAILY PO) Take 1 tablet by mouth daily.   Yes [provider]  Saw Palmetto 450 MG CAPS Take 1 capsule (450 mg total) by mouth daily. 03/29/22  Yes Gutierrez, Javier, MD  simvastatin (ZOCOR) 20 MG tablet TAKE 1 TABLET BY MOUTH EVERY DAY IN THE EVENING 05/18/22  Yes Gutierrez, Javier, MD  triamcinolone cream (KENALOG) 0.1 % Apply 1 Application topically 2 (two) times daily. For max 2 weeks at a time 07/02/22 07/02/23 Yes Gutierrez, Javier, MD  Turmeric (QC TUMERIC COMPLEX PO) Take by mouth.   Yes [provider]  vitamin B-12 (CYANOCOBALAMIN) 1000 MCG tablet Take 1 tablet (1,000 mcg total) by mouth every Monday, Wednesday, and Friday. Patient taking differently: Take 1,000 mcg by mouth daily.  07/22/21  Yes Gutierrez, Javier, MD  warfarin (COUMADIN) 5 MG tablet TAKE 1/2 TABLET BY MOUTH DAILY EXCEPT TAKE NO COUMADIN ON SUNDAY OR AS DIRECTED BY ANTICOAGULATION CLINIC Patient taking differently: Take 5 mg by mouth. 1/2 tab daily except 1 tab MWF 03/26/22  Yes Gutierrez, Javier, MD    Allergies: Allergies  Allergen Reactions   Atorvastatin Other (See Comments)    Dizziness   Lisinopril Cough    Social History:  reports that he quit smoking about 4 years ago. His smoking use included cigarettes. He has a 31.00 pack-year smoking history. He has been exposed to tobacco smoke. He has never used smokeless tobacco. He reports current drug use. Drug: Marijuana. He reports that he does not drink alcohol.   Family History: Family History  Problem Relation Age of Onset   Heart failure Father 85   Cancer Mother 62       NHL   Hypertension Maternal Grandfather    CAD Maternal Grandfather 50       MI   Diabetes Other        grandparents   Cancer Brother 61       lung (smoker)    Review of Systems: Review of Systems  Constitutional:  Negative for chills and fever.  HENT:  Negative for hearing loss.   Respiratory:  Negative for shortness of breath.   Cardiovascular:  Negative for chest pain.  Gastrointestinal:  Negative for abdominal pain, nausea and vomiting.       Umbilical hernia  Genitourinary:  Negative for dysuria.  Musculoskeletal:  Positive for back pain. Negative for myalgias.  Skin:  Negative for rash.       Two cysts on his low back  Neurological:  Negative for dizziness.  Psychiatric/Behavioral:  Negative for depression.     Physical Exam BP (!) 145/80   Pulse (!) 58   Temp 98.2 F (36.8 C)   Ht 5' 11" (1.803 m)   Wt 232 lb (105.2 kg)   SpO2 94%   BMI 32.36 kg/m  CONSTITUTIONAL: No acute distress, well nourished. HEENT:  Normocephalic, atraumatic, extraocular motion intact. NECK: Trachea is midline, and there is no jugular venous distension.  RESPIRATORY:   Lungs are clear, and breath sounds are equal   bilaterally. Normal respiratory effort without pathologic use of accessory muscles. CARDIOVASCULAR: Heart is regular without murmurs, gallops, or rubs. GI: The abdomen is soft, non-distended, with an umbilical hernia, which is reducible, about 3.5 cm size, likely containing omentum.  The overlying skin is thin, but without ulcerations. MUSCULOSKELETAL:  Normal muscle strength and tone in all four extremities.  No peripheral edema or cyanosis. SKIN: Patient has a large 8 cm sebaceous cyst on his left lower back.  It is soft, without any erythema or drainage, and currently not tender.  He also has a smaller 1.5 cm sebaceous cyst on his right lower back, which is also soft, without any erythema or drainage.  NEUROLOGIC:  Motor and sensation is grossly normal.  Cranial nerves are grossly intact. PSYCH:  Alert and oriented to person, place and time. Affect is normal.  Laboratory Analysis: Labs from 06/24/22: WBC 10.9, Hgb 10.6, Hct 34.6, Plt 245.  Iron 30, Ferritin 12,   Imaging: CT chest, abdomen, pelvis on 12/28/21: FINDINGS: CT CHEST FINDINGS   Cardiovascular: Heart is enlarged in size. Coronary artery calcifications are seen. There is homogeneous enhancement in thoracic aorta. There are no intraluminal filling defects in central pulmonary artery branches.   Mediastinum/Nodes: There is a 2.2 cm smooth marginated structure with faint peripheral calcifications anterior to the aortic arch which appears stable. There are few subcentimeter nodes in mediastinum with no significant change.   Lungs/Pleura: Breathing motion limits evaluation. As far as seen, there is no focal consolidation. There are linear densities in lower lung fields, more so on the right side suggesting atelectasis or scarring. There are faint ground-glass densities in the posterior right lower lung field. There is no pneumothorax. There is possible minimal right pleural effusion.    Musculoskeletal: No displaced fractures are seen in the bony structures in chest. Degenerative changes are noted in visualized lower cervical spine with encroachment of neural foramina.   CT ABDOMEN PELVIS FINDINGS   Hepatobiliary: No focal abnormalities are seen in the liver. There is no dilation of bile ducts. Gallbladder is not distended. There is small ascites in the perihepatic region.   Pancreas: No focal abnormalities are seen.   Spleen: Spleen is not enlarged. There is small amount of fluid adjacent to the spleen, possibly part of ascites.   Adrenals/Urinary Tract: There is 1.6 cm nodular density in left adrenal. This may have been the in the previous study. Density measurements are less than 20 Hounsfield units. Right adrenal is unremarkable. There is no hydronephrosis. There are no renal or ureteral stones. Urinary bladder is unremarkable.   Stomach/Bowel: Stomach is not distended. Small bowel loops are not dilated. Appendix is difficult to visualize in image 27 of series 5, there is a small caliber tubular structure at the tip of the cecum, possibly normal appendix. There is no focal pericecal inflammation. There is no significant wall thickening and colon. Scattered diverticula are seen without signs of focal diverticulitis.   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 Scattered arterial calcifications are seen.   Reproductive: Prostate appears smaller than usual, possibly suggesting previous intervention.   Other: There is no pneumoperitoneum. There is diffuse edema in subcutaneous plane in the abdominal wall suggesting anasarca. Large umbilical hernia containing fat is seen. There is a smooth marginated high density structure in subcutaneous plane in the left side of back, possibly incidental benign process such as large sebaceous cyst or some other benign process. Similar finding was seen in previous examinations dating as far back   as  08/11/2016.   Musculoskeletal: No displaced fractures are seen in bony structures.   IMPRESSION: No acute findings are seen in CT scan of chest, abdomen and pelvis. There is no evidence of mediastinal or retroperitoneal hematoma. No displaced fractures are seen.   Small linear patchy densities in the lower lung fields may suggest scarring and possibly atelectasis/pneumonia. There is possible minimal right pleural effusion. There is no pneumothorax.   There is no demonstrable laceration in solid organs. There is no bowel wall thickening.   Coronary artery disease. There is 1.6 cm left adrenal nodule. Follow-up multiphasic CT in 6 months may be considered.   Small ascites. There is diffuse edema in subcutaneous plane suggesting anasarca.   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  Recommend follow-up every 2 years.  Reference: J Am Coll Radiol 2013;10:789-794.   Cervical spondylosis with encroachment of neural foramina.   Umbilical hernia containing fat. Diverticulosis of colon. Other findings as described in the body of the report.    Assessment and Plan: This is a 66 y.o. male with a large left lower back sebaceous cyst, a smaller right lower back sebaceous cyst, and a 3.5-4 cm umbilical hernia.  --Discussed with the patient that all three issues can be addressed surgically, and can be combined in the same surgical setting.  Given the size of the larger sebaceous cyst, this would also require excision in the OR and not as an office procedure, so combining all three areas in one case is reasonable to do.  Discussed with him that we would need clearance to stop his Coumadin, and will send medical clearance to his PCP. --Discussed with him the plan for a robotic assisted umbilical hernia repair as well as excision of lower back sebaceous cysts x 2, and reviewed the surgeries at length with him including the planned incisions, the risks of bleeding, infection, injury to  surrounding structures, the use of mesh for his hernia, the possibility of using a drain for the larger cyst space, that this would be an outpatient surgery, post-operative activity restrictions, pain control, and he's willing to proceed. --Will schedule his surgery for 08/05/22.  All of his questions have been answered.  I spent 55 minutes dedicated to the care of this patient on the date of this encounter to include pre-visit review of records, face-to-face time with the patient discussing diagnosis and management, and any post-visit coordination of care.   Shinichi Anguiano Luis Karysa Heft, MD [facility]    

## 2022-07-14 NOTE — Progress Notes (Signed)
07/14/2022  Reason for Visit: Sebaceous cyst of the back, umbilical hernia  Requesting Provider: [name], MD  History of Present Illness: [name] is a 67 y.o. male presenting for evaluation of a large sebaceous cyst of the back.  It is located in the left lower back.  The patient reports he has had this for at least 5 years.  It has grown in size and he has also lost a lot of weight intentionally which makes it protrude more.  He has also noticed a new one on his right lower back which is much smaller, but reports that the on on the left started that small.  He reports that the left sided cyst bothers him with discomfort in his back, particularly if he is bending to one side or the other.  Denies any drainage from either site and denies any episodes where the areas have become very swollen and red/tender.    He also mentions that he has an umbilical hernia.  He has had this for at least the same amount of time as the larger sebaceous cyst.  He reports very seldom episodes of pain at the umbilicus depending on his activity.  He hernia does push in, but he reports it has grown in size with time and the overlying skin is very stretched.  Denies any nausea or vomiting, chest pain or shortness of breath.  He has history of CHF, COPD, atrial fibrillation, DM, and OSA.  He is on Coumadin.  Past Medical History: Past Medical History:  Diagnosis Date   Abnormal drug screen 2015   MJ positive x3 - one more and we will stop prescribing ativan (08/2013).   Anemia    Angina    Anxiety    Arrhythmia    atrial fibrillation   Arthritis    CAD (coronary artery disease)    nonobstructive   Cannabis abuse    Chronic systolic CHF (congestive heart failure) ([place]) 2013   NYHA Class II/III   COPD (chronic obstructive pulmonary disease) ([place])    Diabetes type 2, uncontrolled    declines DSME   Dilated cardiomyopathy ([place]) 2013   now improved   Ex-smoker    Frequent headaches    History  of atrial fibrillation 2013   chronic, s/p ablation prior on coumadin   Hyperlipidemia    Hypertension    Migraine    Narcolepsy    Nonischemic cardiomyopathy ([place]) 2013   EF 25% per Dr Nehemiah Massed   Obesity    OSA (obstructive sleep apnea)    does not use CPAP - unable to tolerate   Seasonal allergies      Past Surgical History: Past Surgical History:  Procedure Laterality Date   ATRIAL FLUTTER ABLATION N/A 09/21/2011   Procedure: ATRIAL FLUTTER ABLATION;  Surgeon: Thompson Grayer, MD;  Location: [facility] CATH LAB;  Service: Cardiovascular;  Laterality: N/A;   CARDIAC ELECTROPHYSIOLOGY MAPPING AND ABLATION  2013   for atrial flutter   CARDIOVASCULAR STRESS TEST  03/2012   ETT WNL Nehemiah Massed)   CARDIOVERSION N/A 08/12/2021   Procedure: CARDIOVERSION;  Surgeon: Corey Skains, MD;  Location: [facility] ORS;  Service: Cardiovascular;  Laterality: N/A;   COLONOSCOPY WITH PROPOFOL N/A 05/22/2020   TA, diverticulosis, descending colon ulcer biopsy WNL, int hem Allen Norris, Darren, MD)   COLONOSCOPY WITH PROPOFOL N/A 04/28/2022   multiple TAs - 10 small, 2 1.2cm, 2 21mm, rpt 3 yrs (Vanga, Tally Due, MD)   ESOPHAGOGASTRODUODENOSCOPY (EGD) WITH PROPOFOL N/A 05/22/2020  WNL ([place])   ESOPHAGOGASTRODUODENOSCOPY (EGD) WITH PROPOFOL N/A 04/28/2022   83mm duodenal TA, reactive gastropathy, enteric mucosa in stomach biopsy of unclear significance (Vanga, Tally Due, MD)   GIVENS CAPSULE STUDY  04/28/2022   Procedure: GIVENS CAPSULE STUDY;  Surgeon: Lin Landsman, MD;  Location: [facility];  Service: Endoscopy;;   US ECHOCARDIOGRAPHY  2014   EF 50%, nl LV fxn, RV nl size/function, mild mitral insuff    Home Medications: Prior to Admission medications   Medication Sig Start Date End Date Taking? Authorizing Provider  ACCU-CHEK FASTCLIX LANCETS MISC Check blood sugar once daily and as instructed. Dx 250.00 05/10/13  Yes [name], MD  albuterol (VENTOLIN HFA) 108 (90 Base) MCG/ACT inhaler  Inhale 2 puffs into the lungs every 6 (six) hours as needed for wheezing or shortness of breath. 01/12/22  Yes [name], MD  amiodarone (PACERONE) 200 MG tablet Take 1 tablet (200 mg total) by mouth daily. 09/16/21  Yes Vickie Epley, MD  Blood Glucose Monitoring Suppl (BLOOD GLUCOSE MONITOR SYSTEM) W/DEVICE KIT by Does not apply route. Use to check sugar once daily and as needed Dx: E11.9 **ONE TOUCH VERIO**   Yes [provider]  Budeson-Glycopyrrol-Formoterol (BREZTRI AEROSPHERE) 160-9-4.8 MCG/ACT AERO Inhale 2 puffs into the lungs 2 (two) times daily. 10/23/21  Yes [name], MD  carvedilol (COREG) 6.25 MG tablet TAKE 1 TABLET BY MOUTH TWICE A DAY WITH FOOD 04/30/22  Yes [name], MD  Cholecalciferol (VITAMIN D) 50 MCG (2000 UT) CAPS Take 1 capsule (2,000 Units total) by mouth daily. 02/05/19  Yes [name], MD  dapagliflozin propanediol (FARXIGA) 10 MG TABS tablet Take 1 tablet (10 mg total) by mouth daily before breakfast. 03/01/22  Yes Alisa Graff, FNP  Docusate Calcium (STOOL SOFTENER PO) Take 1 tablet by mouth daily.   Yes [provider]  Fe Fum-Fe Poly-Vit C-Lactobac (FUSION PO) Take by mouth. Patient states received sample.  Unsure of mg.   Yes [provider]  FLUoxetine (PROZAC) 40 MG capsule Take 1 capsule (40 mg total) by mouth daily. 02/19/22  Yes [name], MD  fluticasone [facility]) 50 MCG/ACT nasal spray Place 2 sprays into both nostrils daily. 01/12/22  Yes [name], MD  furosemide (LASIX) 80 MG tablet TAKE 1 TABLET BY MOUTH 2 TIMES DAILY. 05/26/22  Yes [name], MD  glimepiride (AMARYL) 4 MG tablet Take 1 tablet (4 mg total) by mouth daily with breakfast. 01/14/22  Yes [name], MD  glucose blood [facility] VERIO) test strip Check blood sugar 3 times a day 10/22/21  Yes [name], MD  ipratropium-albuterol (DUONEB) 0.5-2.5 (3) MG/3ML SOLN TAKE 3 MLS BY NEBULIZATION EVERY 6 (SIX)  HOURS AS NEEDED (SEVERE SHORTNESS OF BREATH/WHEEZING). 05/31/22  Yes [name], MD  metFORMIN (GLUCOPHAGE) 1000 MG tablet Take 1 tablet (1,000 mg total) by mouth 2 (two) times daily with a meal. 01/14/22  Yes [name], MD  methocarbamol (ROBAXIN) 500 MG tablet Take 1 tablet (500 mg total) by mouth 3 (three) times daily as needed for muscle spasms (sedation precautions). 07/02/22  Yes [name], MD  metolazone (ZAROXOLYN) 2.5 MG tablet Take 1 tablet (2.5 mg total) by mouth once a week. 04/03/22  Yes [name], MD  montelukast (SINGULAIR) 10 MG tablet TAKE 1 TABLET BY MOUTH EVERYDAY AT BEDTIME 07/06/22  Yes [name], MD  OVER THE COUNTER MEDICATION Take 1 tablet by mouth daily. Superbeets   Yes [provider]  OVER THE COUNTER  MEDICATION Take 1 capsule by mouth daily at 6 (six) AM. [place] sea moss   Yes [provider]  pantoprazole (PROTONIX) 40 MG tablet TAKE 1 TABLET (40 MG TOTAL) BY MOUTH TWICE A DAY BEFORE MEALS 07/13/22  Yes [name], MD  potassium chloride (KLOR-CON M) 10 MEQ tablet Take 1 tablet (10 mEq total) by mouth daily. And an extra one on the days you take metolazone 02/25/22  Yes Darylene Price A, FNP  Probiotic Product (PROBIOTIC DAILY PO) Take 1 tablet by mouth daily.   Yes [provider]  Saw Palmetto 450 MG CAPS Take 1 capsule (450 mg total) by mouth daily. 03/29/22  Yes [name], MD  simvastatin (ZOCOR) 20 MG tablet TAKE 1 TABLET BY MOUTH EVERY DAY IN THE EVENING 05/18/22  Yes [name], MD  triamcinolone cream (KENALOG) 0.1 % Apply 1 Application topically 2 (two) times daily. For max 2 weeks at a time 07/02/22 07/02/23 Yes [name], MD  Turmeric (QC TUMERIC COMPLEX PO) Take by mouth.   Yes [provider]  vitamin B-12 (CYANOCOBALAMIN) 1000 MCG tablet Take 1 tablet (1,000 mcg total) by mouth every Monday, Wednesday, and Friday. Patient taking differently: Take 1,000 mcg by mouth daily.  07/22/21  Yes [name], MD  warfarin (COUMADIN) 5 MG tablet TAKE 1/2 TABLET BY MOUTH DAILY EXCEPT TAKE NO COUMADIN ON SUNDAY OR AS DIRECTED BY ANTICOAGULATION CLINIC Patient taking differently: Take 5 mg by mouth. 1/2 tab daily except 1 tab MWF 03/26/22  Yes [name], MD    Allergies: Allergies  Allergen Reactions   Atorvastatin Other (See Comments)    Dizziness   Lisinopril Cough    Social History:  reports that he quit smoking about 4 years ago. His smoking use included cigarettes. He has a 31.00 pack-year smoking history. He has been exposed to tobacco smoke. He has never used smokeless tobacco. He reports current drug use. Drug: Marijuana. He reports that he does not drink alcohol.   Family History: Family History  Problem Relation Age of Onset   Heart failure Father 5   Cancer Mother 43       NHL   Hypertension Maternal Grandfather    CAD Maternal Grandfather 83       MI   Diabetes Other        grandparents   Cancer Brother 57       lung (smoker)    Review of Systems: Review of Systems  Constitutional:  Negative for chills and fever.  HENT:  Negative for hearing loss.   Respiratory:  Negative for shortness of breath.   Cardiovascular:  Negative for chest pain.  Gastrointestinal:  Negative for abdominal pain, nausea and vomiting.       Umbilical hernia  Genitourinary:  Negative for dysuria.  Musculoskeletal:  Positive for back pain. Negative for myalgias.  Skin:  Negative for rash.       Two cysts on his low back  Neurological:  Negative for dizziness.  Psychiatric/Behavioral:  Negative for depression.     Physical Exam BP (!) 145/80   Pulse (!) 58   Temp 98.2 F (36.8 C)   Ht 5\' 11"  (1.803 m)   Wt 232 lb (105.2 kg)   SpO2 94%   BMI 32.36 kg/m  CONSTITUTIONAL: No acute distress, well nourished. HEENT:  Normocephalic, atraumatic, extraocular motion intact. NECK: Trachea is midline, and there is no jugular venous distension.  RESPIRATORY:   Lungs are clear, and breath sounds are equal  bilaterally. Normal respiratory effort without pathologic use of accessory muscles. CARDIOVASCULAR: Heart is regular without murmurs, gallops, or rubs. GI: The abdomen is soft, non-distended, with an umbilical hernia, which is reducible, about 3.5 cm size, likely containing omentum.  The overlying skin is thin, but without ulcerations. MUSCULOSKELETAL:  Normal muscle strength and tone in all four extremities.  No peripheral edema or cyanosis. SKIN: Patient has a large 8 cm sebaceous cyst on his left lower back.  It is soft, without any erythema or drainage, and currently not tender.  He also has a smaller 1.5 cm sebaceous cyst on his right lower back, which is also soft, without any erythema or drainage.  NEUROLOGIC:  Motor and sensation is grossly normal.  Cranial nerves are grossly intact. PSYCH:  Alert and oriented to person, place and time. Affect is normal.  Laboratory Analysis: Labs from 06/24/22: WBC 10.9, Hgb 10.6, Hct 34.6, Plt 245.  Iron 30, Ferritin 12,   Imaging: CT chest, abdomen, pelvis on 12/28/21: FINDINGS: CT CHEST FINDINGS   Cardiovascular: Heart is enlarged in size. Coronary artery calcifications are seen. There is homogeneous enhancement in thoracic aorta. There are no intraluminal filling defects in central pulmonary artery branches.   Mediastinum/Nodes: There is a 2.2 cm smooth marginated structure with faint peripheral calcifications anterior to the aortic arch which appears stable. There are few subcentimeter nodes in mediastinum with no significant change.   Lungs/Pleura: Breathing motion limits evaluation. As far as seen, there is no focal consolidation. There are linear densities in lower lung fields, more so on the right side suggesting atelectasis or scarring. There are faint ground-glass densities in the posterior right lower lung field. There is no pneumothorax. There is possible minimal right pleural effusion.    Musculoskeletal: No displaced fractures are seen in the bony structures in chest. Degenerative changes are noted in visualized lower cervical spine with encroachment of neural foramina.   CT ABDOMEN PELVIS FINDINGS   Hepatobiliary: No focal abnormalities are seen in the liver. There is no dilation of bile ducts. Gallbladder is not distended. There is small ascites in the perihepatic region.   Pancreas: No focal abnormalities are seen.   Spleen: Spleen is not enlarged. There is small amount of fluid adjacent to the spleen, possibly part of ascites.   Adrenals/Urinary Tract: There is 1.6 cm nodular density in left adrenal. This may have been the in the previous study. Density measurements are less than 20 Hounsfield units. Right adrenal is unremarkable. There is no hydronephrosis. There are no renal or ureteral stones. Urinary bladder is unremarkable.   Stomach/Bowel: Stomach is not distended. Small bowel loops are not dilated. Appendix is difficult to visualize in image 27 of series 5, there is a small caliber tubular structure at the tip of the cecum, possibly normal appendix. There is no focal pericecal inflammation. There is no significant wall thickening and colon. Scattered diverticula are seen without signs of focal diverticulitis.   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 Scattered arterial calcifications are seen.   Reproductive: Prostate appears smaller than usual, possibly suggesting previous intervention.   Other: There is no pneumoperitoneum. There is diffuse edema in subcutaneous plane in the abdominal wall suggesting anasarca. Large umbilical hernia containing fat is seen. There is a smooth marginated high density structure in subcutaneous plane in the left side of back, possibly incidental benign process such as large sebaceous cyst or some other benign process. Similar finding was seen in previous examinations dating as far back  as  08/11/2016.   Musculoskeletal: No displaced fractures are seen in bony structures.   IMPRESSION: No acute findings are seen in CT scan of chest, abdomen and pelvis. There is no evidence of mediastinal or retroperitoneal hematoma. No displaced fractures are seen.   Small linear patchy densities in the lower lung fields may suggest scarring and possibly atelectasis/pneumonia. There is possible minimal right pleural effusion. There is no pneumothorax.   There is no demonstrable laceration in solid organs. There is no bowel wall thickening.   Coronary artery disease. There is 1.6 cm left adrenal nodule. Follow-up multiphasic CT in 6 months may be considered.   Small ascites. There is diffuse edema in subcutaneous plane suggesting anasarca.   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  Recommend follow-up every 2 years.  Reference: J Am Coll Radiol [id number].   Cervical spondylosis with encroachment of neural foramina.   Umbilical hernia containing fat. Diverticulosis of colon. Other findings as described in the body of the report.    Assessment and Plan: This is a 67 y.o. male with a large left lower back sebaceous cyst, a smaller right lower back sebaceous cyst, and a [id number] cm umbilical hernia.  --Discussed with the patient that all three issues can be addressed surgically, and can be combined in the same surgical setting.  Given the size of the larger sebaceous cyst, this would also require excision in the OR and not as an office procedure, so combining all three areas in one case is reasonable to do.  Discussed with him that we would need clearance to stop his Coumadin, and will send medical clearance to his PCP. --Discussed with him the plan for a robotic assisted umbilical hernia repair as well as excision of lower back sebaceous cysts x 2, and reviewed the surgeries at length with him including the planned incisions, the risks of bleeding, infection, injury to  surrounding structures, the use of mesh for his hernia, the possibility of using a drain for the larger cyst space, that this would be an outpatient surgery, post-operative activity restrictions, pain control, and he's willing to proceed. --Will schedule his surgery for 08/05/22.  All of his questions have been answered.  I spent 55 minutes dedicated to the care of this patient on the date of this encounter to include pre-visit review of records, face-to-face time with the patient discussing diagnosis and management, and any post-visit coordination of care.   Melvyn Neth, [facility]

## 2022-07-14 NOTE — Progress Notes (Signed)
Request for Medical Clearance and to stop Coumadin for 5 days prior to surgery has been faxed to Dr Danise Mina.

## 2022-07-14 NOTE — Patient Instructions (Addendum)
You have requested for your Umbilical Hernia be repaired and your lipoma removed. This will be scheduled with Dr. Hampton Abbot at [facility].   Please see your (blue)pre-care sheet for information. Our surgery scheduler will call you to verify surgery date and to go over information.   We would like you to stop your Coumadin 5 days prior to surgery, your last dose would be on April 5th.  You will need to arrange to be off work for 1-2 weeks but will have to have a lifting restriction of no more than 15 lbs for 6 weeks following your surgery. If you have FMLA or disability paperwork that needs filled out you may drop this off at our office or this can be faxed to [phone].     Umbilical Hernia, Adult A hernia is a bulge of tissue that pushes through an opening between muscles. An umbilical hernia happens in the abdomen, near the belly button (umbilicus). The hernia may contain tissues from the small intestine, large intestine, or fatty tissue covering the intestines (omentum). Umbilical hernias in adults tend to get worse over time, and they require surgical treatment. There are several types of umbilical hernias. You may have: A hernia located just above or below the umbilicus (indirect hernia). This is the most common type of umbilical hernia in adults. A hernia that forms through an opening formed by the umbilicus (direct hernia). A hernia that comes and goes (reducible hernia). A reducible hernia may be visible only when you strain, lift something heavy, or cough. This type of hernia can be pushed back into the abdomen (reduced). A hernia that traps abdominal tissue inside the hernia (incarcerated hernia). This type of hernia cannot be reduced. A hernia that cuts off blood flow to the tissues inside the hernia (strangulated hernia). The tissues can start to die if this happens. This type of hernia requires emergency treatment.  What are the causes? An umbilical hernia  happens when tissue inside the abdomen presses on a weak area of the abdominal muscles. What increases the risk? You may have a greater risk of this condition if you: Are obese. Have had several pregnancies. Have a buildup of fluid inside your abdomen (ascites). Have had surgery that weakens the abdominal muscles.  What are the signs or symptoms? The main symptom of this condition is a painless bulge at or near the belly button. A reducible hernia may be visible only when you strain, lift something heavy, or cough. Other symptoms may include: Dull pain. A feeling of pressure.  Symptoms of a strangulated hernia may include: Pain that gets increasingly worse. Nausea and vomiting. Pain when pressing on the hernia. Skin over the hernia becoming red or purple. Constipation. Blood in the stool.  How is this diagnosed? This condition may be diagnosed based on: A physical exam. You may be asked to cough or strain while standing. These actions increase the pressure inside your abdomen and force the hernia through the opening in your muscles. Your health care provider may try to reduce the hernia by pressing on it. Your symptoms and medical history.  How is this treated? Surgery is the only treatment for an umbilical hernia. Surgery for a strangulated hernia is done as soon as possible. If you have a small hernia that is not incarcerated, you may need to lose weight before having surgery. Follow these instructions at home: Lose weight, if told by your health care provider. Do not try to push the hernia back  in. Watch your hernia for any changes in color or size. Tell your health care provider if any changes occur. You may need to avoid activities that increase pressure on your hernia. Do not lift anything that is heavier than 10 lb (4.5 kg) until your health care provider says that this is safe. Take over-the-counter and prescription medicines only as told by your health care provider. Keep  all follow-up visits as told by your health care provider. This is important. Contact a health care provider if: Your hernia gets larger. Your hernia becomes painful. Get help right away if: You develop sudden, severe pain near the area of your hernia. You have pain as well as nausea or vomiting. You have pain and the skin over your hernia changes color. You develop a fever.  Lipoma Removal  Lipoma removal is a surgical procedure to remove a lipoma, which is a noncancerous (benign) tumor that is made up of fat cells. Most lipomas are small and painless and do not require treatment. They can form in many areas of the body but are most common under the skin of the back, arms, shoulders, buttocks, and thighs. You may need lipoma removal if you have a lipoma that is large, growing, or causing discomfort. Lipoma removal may also be done for cosmetic reasons. Tell a health care provider about: Any allergies you have. All medicines you are taking, including vitamins, herbs, eye drops, creams, and over-the-counter medicines. Any problems you or family members have had with anesthetic medicines. Any bleeding problems you have. Any surgeries you have had. Any medical conditions you have. Whether you are pregnant or may be pregnant. What are the risks? Generally, this is a safe procedure. However, problems may occur, including: Infection. Bleeding. Scarring. Allergic reactions to medicines. Damage to nearby structures or organs, such as damage to nerves or blood vessels near the lipoma. What happens before the procedure? If you do not follow your health care provider's instructions, your procedure may be delayed or canceled. Medicines Ask your health care provider about: Changing or stopping your regular medicines. This is especially important if you are taking diabetes medicines or blood thinners. Taking medicines such as aspirin and ibuprofen. These medicines can thin your blood. Do not take  these medicines unless your health care provider tells you to take them. Taking over-the-counter medicines, vitamins, herbs, and supplements. General instructions You will have a physical exam. Your health care provider will check the size of the lipoma and whether it can be removed easily. You may have a biopsy and imaging tests, such as X-rays, a CT scan, and an MRI. Do not use any products that contain nicotine or tobacco for at least 4 weeks before the procedure. These products include cigarettes, chewing tobacco, and vaping devices, such as e-cigarettes. If you need help quitting, ask your health care provider. Ask your health care provider: How your surgery site will be marked. What steps will be taken to help prevent infection. These may include: Washing skin with a germ-killing soap. Taking antibiotic medicine. If you will be going home right after the procedure, plan to have a responsible adult: Take you home from the hospital or clinic. You will not be allowed to drive. Care for you for the time you are told. What happens during the procedure?  An IV will be inserted into one of your veins. You will be given one or more of the following: A medicine to help you relax (sedative). A medicine to numb the area (  local anesthetic). A medicine to make you fall asleep (general anesthetic). A medicine that is injected into an area of your body to numb everything below the injection site (regional anesthetic). An incision will be made into the skin over the lipoma or very near the lipoma. The incision may be made in a natural skin line or crease. Tissues, nerves, and blood vessels near the lipoma will be moved out of the way. The lipoma and the capsule that surrounds it will be separated from the surrounding tissues. The lipoma will be removed. The incision may be closed with stitches (sutures). A bandage (dressing) will be placed over the incision. The procedure may vary among health care  providers and hospitals. What happens after the procedure? Your blood pressure, heart rate, breathing rate, and blood oxygen level will be monitored until you leave the hospital or clinic. If you were prescribed an antibiotic medicine, use it as told by your health care provider. Do not stop using the antibiotic even if you start to feel better. If you were given a sedative during the procedure, it can affect you for several hours. Do not drive or operate machinery until your health care provider says that it is safe. Where to find more information OrthoInfo: orthoinfo.aaos.org Summary Before the procedure, follow instructions from your health care provider about eating and drinking, and changing or stopping your regular medicines. This is especially important if you are taking diabetes medicines or blood thinners. After the lipoma is removed, the incision may be closed with stitches (sutures) and covered with a bandage (dressing). If you were given a sedative during the procedure, it can affect you for several hours. Do not drive or operate machinery until your health care provider says that it is safe. This information is not intended to replace advice given to you by your health care provider. Make sure you discuss any questions you have with your health care provider. Document Revised: 05/01/2021 Document Reviewed: 05/01/2021 Elsevier Patient Education  [place].

## 2022-07-15 ENCOUNTER — Telehealth: Payer: Self-pay | Admitting: Surgery

## 2022-07-15 ENCOUNTER — Telehealth: Payer: Self-pay

## 2022-07-15 DIAGNOSIS — J9601 Acute respiratory failure with hypoxia: Secondary | ICD-10-CM | POA: Diagnosis not present

## 2022-07-15 DIAGNOSIS — G4733 Obstructive sleep apnea (adult) (pediatric): Secondary | ICD-10-CM | POA: Diagnosis not present

## 2022-07-15 NOTE — Telephone Encounter (Addendum)
Received surgical clearance form from [facility] Pt is to to have robotic repairs of umbilical hernia, lipoma removal w/ general anesthesia. Also, needs to stop Coumadin 5 days prior to surgery. Surgery date- 08/05/22.  [Form is in basket on [employer].]

## 2022-07-15 NOTE — Telephone Encounter (Signed)
Lvm asking pt to call back. Needs to schedule pre-op OV.

## 2022-07-15 NOTE — Telephone Encounter (Signed)
Left message for patient to call, please inform him of the following regarding scheduled surgery with Dr. Hampton Abbot.   Pre-Admission date/time, and Surgery date at [facility].  Surgery Date: 08/05/22 Preadmission Testing Date: 07/28/22 (phone 8a-1p)  Also patient will need to call at [phone], between 1-3:00pm the day before surgery, to find out what time to arrive for surgery.

## 2022-07-16 ENCOUNTER — Ambulatory Visit: Payer: Medicare Other

## 2022-07-16 NOTE — Telephone Encounter (Signed)
Lvm asking pt to call back. Needs to schedule pre-op OV.

## 2022-07-16 NOTE — Telephone Encounter (Signed)
Another message is left for patient to call.  ? ?

## 2022-07-18 ENCOUNTER — Other Ambulatory Visit: Payer: Self-pay | Admitting: Family Medicine

## 2022-07-18 DIAGNOSIS — E1169 Type 2 diabetes mellitus with other specified complication: Secondary | ICD-10-CM

## 2022-07-19 ENCOUNTER — Telehealth: Payer: Self-pay | Admitting: *Deleted

## 2022-07-19 NOTE — Progress Notes (Unsigned)
  Care Coordination  Outreach Note  07/19/2022 Name: JOSTEN COVINGTON MRN: [id number] DOB: February 17, 1956   Care Coordination Outreach Attempts: An unsuccessful telephone outreach was attempted today to offer the patient information about available care coordination services as a benefit of their health plan.   Follow Up Plan:  Additional outreach attempts will be made to offer the patient care coordination information and services.   Encounter Outcome:  No Answer  Julian Hy, [place] Direct Dial: [phone]

## 2022-07-19 NOTE — Telephone Encounter (Signed)
Looks like pt scheduled pre-op OV on 07/26/22 at 11:30.

## 2022-07-19 NOTE — Telephone Encounter (Signed)
Another message is left for patient to call.  ? ?

## 2022-07-20 ENCOUNTER — Ambulatory Visit: Payer: Medicare Other | Attending: Student in an Organized Health Care Education/Training Program

## 2022-07-20 DIAGNOSIS — Z87891 Personal history of nicotine dependence: Secondary | ICD-10-CM | POA: Insufficient documentation

## 2022-07-20 DIAGNOSIS — J449 Chronic obstructive pulmonary disease, unspecified: Secondary | ICD-10-CM | POA: Diagnosis not present

## 2022-07-20 DIAGNOSIS — R0600 Dyspnea, unspecified: Secondary | ICD-10-CM | POA: Diagnosis not present

## 2022-07-20 DIAGNOSIS — G4733 Obstructive sleep apnea (adult) (pediatric): Secondary | ICD-10-CM | POA: Diagnosis not present

## 2022-07-20 LAB — PULMONARY FUNCTION TEST ARMC ONLY
DL/VA % pred: 73 %
DL/VA: 3 ml/min/mmHg/L
DLCO unc % pred: 47 %
DLCO unc: 12.94 ml/min/mmHg
FEF 25-75 Post: 0.8 L/sec
FEF 25-75 Pre: 0.83 L/sec
FEF2575-%Change-Post: -3 %
FEF2575-%Pred-Post: 29 %
FEF2575-%Pred-Pre: 30 %
FEV1-%Change-Post: 2 %
FEV1-%Pred-Post: 41 %
FEV1-%Pred-Pre: 40 %
FEV1-Post: 1.45 L
FEV1-Pre: 1.42 L
FEV1FVC-%Change-Post: -9 %
FEV1FVC-%Pred-Pre: 86 %
FEV6-%Change-Post: 11 %
FEV6-%Pred-Post: 54 %
FEV6-%Pred-Pre: 48 %
FEV6-Post: 2.45 L
FEV6-Pre: 2.2 L
FEV6FVC-%Change-Post: -1 %
FEV6FVC-%Pred-Post: 103 %
FEV6FVC-%Pred-Pre: 104 %
FVC-%Change-Post: 12 %
FVC-%Pred-Post: 52 %
FVC-%Pred-Pre: 46 %
FVC-Post: 2.5 L
FVC-Pre: 2.21 L
Post FEV1/FVC ratio: 58 %
Post FEV6/FVC ratio: 98 %
Pre FEV1/FVC ratio: 64 %
Pre FEV6/FVC Ratio: 99 %
RV % pred: 92 %
RV: 2.23 L
TLC % pred: 64 %
TLC: 4.63 L

## 2022-07-20 MED ORDER — ALBUTEROL SULFATE (2.5 MG/3ML) 0.083% IN NEBU
2.5000 mg | INHALATION_SOLUTION | Freq: Once | RESPIRATORY_TRACT | Status: AC
  Administered 2022-07-20: 2.5 mg via RESPIRATORY_TRACT

## 2022-07-20 NOTE — Telephone Encounter (Signed)
Another message is left.

## 2022-07-20 NOTE — Telephone Encounter (Signed)
Patient finally returns call, he is now informed of all dates regarding his surgery.

## 2022-07-20 NOTE — Progress Notes (Signed)
Pt since has been scheduled with RN for 08/13/2022

## 2022-07-21 ENCOUNTER — Ambulatory Visit: Payer: Medicare Other | Admitting: Gastroenterology

## 2022-07-22 ENCOUNTER — Ambulatory Visit (INDEPENDENT_AMBULATORY_CARE_PROVIDER_SITE_OTHER): Payer: Medicare Other

## 2022-07-22 DIAGNOSIS — Z7901 Long term (current) use of anticoagulants: Secondary | ICD-10-CM | POA: Diagnosis not present

## 2022-07-22 LAB — POCT INR: INR: 2.1 (ref 2.0–3.0)

## 2022-07-22 MED ORDER — WARFARIN SODIUM 5 MG PO TABS: ORAL_TABLET | ORAL | 1 refills | Status: DC

## 2022-07-22 NOTE — Progress Notes (Addendum)
Umbilical hernia repair surgery on 4/11. Pt will need to hold warfarin. Pt has apt on 4/1 with PCP for surgery clearance.  Continue to take 1/2 tablet daily except take 1 tablet on Monday, Wednesday, and Friday. Recheck in 3 week.   Pt requested refill of warfarin. Pt is compliant with warfarin management and PCP apts.  Sent in refill of warfarin to requested pharmacy.

## 2022-07-22 NOTE — Addendum Note (Signed)
Addended by: Randall An A on: 07/22/2022 11:19 AM   Modules accepted: Orders

## 2022-07-22 NOTE — Patient Instructions (Addendum)
Pre visit review using our clinic review tool, if applicable. No additional management support is needed unless otherwise documented below in the visit note.  Continue to take 1/2 tablet daily except take 1 tablet on Monday, Wednesday, and Friday. Recheck in 3 week.

## 2022-07-26 ENCOUNTER — Ambulatory Visit (INDEPENDENT_AMBULATORY_CARE_PROVIDER_SITE_OTHER): Payer: Medicare Other | Admitting: Family Medicine

## 2022-07-26 ENCOUNTER — Encounter: Payer: Self-pay | Admitting: Family Medicine

## 2022-07-26 ENCOUNTER — Ambulatory Visit (INDEPENDENT_AMBULATORY_CARE_PROVIDER_SITE_OTHER)
Admission: RE | Admit: 2022-07-26 | Discharge: 2022-07-26 | Disposition: A | Discharge status: Home / Self Care | Payer: Medicare Other | Source: Ambulatory Visit | Attending: Family Medicine | Admitting: Family Medicine

## 2022-07-26 VITALS — BP 112/62 | HR 63 | Temp 97.7°F | Ht 71.0 in | Wt 222.0 lb

## 2022-07-26 DIAGNOSIS — I4819 Other persistent atrial fibrillation: Secondary | ICD-10-CM | POA: Diagnosis not present

## 2022-07-26 DIAGNOSIS — Z01818 Encounter for other preprocedural examination: Secondary | ICD-10-CM

## 2022-07-26 DIAGNOSIS — G4733 Obstructive sleep apnea (adult) (pediatric): Secondary | ICD-10-CM | POA: Diagnosis not present

## 2022-07-26 DIAGNOSIS — J9611 Chronic respiratory failure with hypoxia: Secondary | ICD-10-CM

## 2022-07-26 DIAGNOSIS — J432 Centrilobular emphysema: Secondary | ICD-10-CM | POA: Diagnosis not present

## 2022-07-26 DIAGNOSIS — I10 Essential (primary) hypertension: Secondary | ICD-10-CM | POA: Diagnosis not present

## 2022-07-26 DIAGNOSIS — D508 Other iron deficiency anemias: Secondary | ICD-10-CM

## 2022-07-26 DIAGNOSIS — I5032 Chronic diastolic (congestive) heart failure: Secondary | ICD-10-CM

## 2022-07-26 DIAGNOSIS — E1169 Type 2 diabetes mellitus with other specified complication: Secondary | ICD-10-CM

## 2022-07-26 DIAGNOSIS — R0609 Other forms of dyspnea: Secondary | ICD-10-CM

## 2022-07-26 DIAGNOSIS — J9612 Chronic respiratory failure with hypercapnia: Secondary | ICD-10-CM

## 2022-07-26 DIAGNOSIS — K429 Umbilical hernia without obstruction or gangrene: Secondary | ICD-10-CM | POA: Diagnosis not present

## 2022-07-26 DIAGNOSIS — R222 Localized swelling, mass and lump, trunk: Secondary | ICD-10-CM

## 2022-07-26 DIAGNOSIS — D649 Anemia, unspecified: Secondary | ICD-10-CM

## 2022-07-26 DIAGNOSIS — I2510 Atherosclerotic heart disease of native coronary artery without angina pectoris: Secondary | ICD-10-CM

## 2022-07-26 DIAGNOSIS — N289 Disorder of kidney and ureter, unspecified: Secondary | ICD-10-CM

## 2022-07-26 DIAGNOSIS — Z7901 Long term (current) use of anticoagulants: Secondary | ICD-10-CM

## 2022-07-26 LAB — CBC WITH DIFFERENTIAL/PLATELET
Basophils Absolute: 0 10*3/uL (ref 0.0–0.1)
Basophils Relative: 0.4 % (ref 0.0–3.0)
Eosinophils Absolute: 0.1 10*3/uL (ref 0.0–0.7)
Eosinophils Relative: 1.2 % (ref 0.0–5.0)
HCT: 37.1 % — ABNORMAL LOW (ref 39.0–52.0)
Hemoglobin: 12.3 g/dL — ABNORMAL LOW (ref 13.0–17.0)
Lymphocytes Relative: 16.1 % (ref 12.0–46.0)
Lymphs Abs: 1.6 10*3/uL (ref 0.7–4.0)
MCHC: 33.3 g/dL (ref 30.0–36.0)
MCV: 84.1 fl (ref 78.0–100.0)
Monocytes Absolute: 1 10*3/uL (ref 0.1–1.0)
Monocytes Relative: 10.2 % (ref 3.0–12.0)
Neutro Abs: 7.3 10*3/uL (ref 1.4–7.7)
Neutrophils Relative %: 72.1 % (ref 43.0–77.0)
Platelets: 290 10*3/uL (ref 150.0–400.0)
RBC: 4.41 Mil/uL (ref 4.22–5.81)
RDW: 22.9 % — ABNORMAL HIGH (ref 11.5–15.5)
WBC: 10.2 10*3/uL (ref 4.0–10.5)

## 2022-07-26 LAB — COMPREHENSIVE METABOLIC PANEL
ALT: 13 U/L (ref 0–53)
AST: 20 U/L (ref 0–37)
Albumin: 4.3 g/dL (ref 3.5–5.2)
Alkaline Phosphatase: 59 U/L (ref 39–117)
BUN: 20 mg/dL (ref 6–23)
CO2: 36 mEq/L — ABNORMAL HIGH (ref 19–32)
Calcium: 9.9 mg/dL (ref 8.4–10.5)
Chloride: 91 mEq/L — ABNORMAL LOW (ref 96–112)
Creatinine, Ser: 1.54 mg/dL — ABNORMAL HIGH (ref 0.40–1.50)
GFR: 46.6 mL/min — ABNORMAL LOW (ref >60.00)
Glucose, Bld: 121 mg/dL — ABNORMAL HIGH (ref 70–99)
Potassium: 3.9 mEq/L (ref 3.5–5.1)
Sodium: 138 mEq/L (ref 135–145)
Total Bilirubin: 0.3 mg/dL (ref 0.2–1.2)
Total Protein: 7.2 g/dL (ref 6.0–8.3)

## 2022-07-26 LAB — BRAIN NATRIURETIC PEPTIDE: Pro B Natriuretic peptide (BNP): 35 pg/mL (ref 0.0–100.0)

## 2022-07-26 LAB — HEMOGLOBIN A1C: Hgb A1c MFr Bld: 6 % (ref 4.6–6.5)

## 2022-07-26 NOTE — Progress Notes (Unsigned)
Patient ID: TECUMSEH [name], male    DOB: 19-Aug-1955, 67 y.o.   MRN: [id number]  This visit was conducted in person.  BP 112/62   Pulse 63   Temp 97.7 F (36.5 C) (Temporal)   Ht 5\' 11"  (1.803 m)   Wt 222 lb (100.7 kg)   SpO2 93%   BMI 30.96 kg/m    CC: preop eval Subjective:   HPI: [name] is a 67 y.o. male presenting on 07/26/2022 for Pre-op Exam (Robotic repair of umbilical hernia, lipoma removal, general anesthesia/Date of surgery 08/05/22//Would like to know if mariajuana would interfere with anesthesia as mentioned on his blue sheet it states refrain from the use of recreational drugs such as cocaine, but does not specifically list mariajuana. /He smokes mariajuana daily.)   [name]  has a past medical history of Abnormal drug screen (2015), Anemia, Angina, Anxiety, Arrhythmia, Arthritis, CAD (coronary artery disease), Cannabis abuse, Chronic systolic CHF (congestive heart failure) (2013), COPD (chronic obstructive pulmonary disease), Diabetes type 2, uncontrolled, Dilated cardiomyopathy (2013), Ex-smoker, Frequent headaches, History of atrial fibrillation (2013), Hyperlipidemia, Hypertension, Migraine, Narcolepsy, Nonischemic cardiomyopathy (2013), Obesity, OSA (obstructive sleep apnea), and Seasonal allergies.  Planned upcoming robotically assisted umbilical hernia repair along with removal of large lipoma vs sebaceous cyst under general anesthesia 08/05/2022 by Dr Hampton Abbot.   Known sCHF, afib followed by Dr Nehemiah Massed, next apt 08/25/2022. Will need cardiac clearance through his office.   H/o severe OSA not on CPAP.   He notes he's more active with better weather - working outside in yard and around the house. Also with 10 lb weight loss.   Patient has tolerated anesthesia well in the past, doesn't think he's had GETA in the past. Has not had surgery previously.  Latest surgical intervention was colonoscopy/endoscopy 04/2022, prior A flutter ablation 2013.   Denies trouble with post-op nausea/vomiting, or trouble awakening after surgery.   Denies chest pain, palpitations, leg swelling, significant HA.  No fevers/chills, coughing, or UTI symptoms, hematuria.   Chronic exertional dyspnea and chronic dizziness.  Recent GI upset after eating at new restaurant.      Relevant past medical, surgical, family and social history reviewed and updated as indicated. Interim medical history since our last visit reviewed. Allergies and medications reviewed and updated. Outpatient Medications Prior to Visit  Medication Sig Dispense Refill   ACCU-CHEK FASTCLIX LANCETS MISC Check blood sugar once daily and as instructed. Dx 250.00 100 each 3   albuterol (VENTOLIN HFA) 108 (90 Base) MCG/ACT inhaler Inhale 2 puffs into the lungs every 6 (six) hours as needed for wheezing or shortness of breath. 8.5 each 6   amiodarone (PACERONE) 200 MG tablet Take 1 tablet (200 mg total) by mouth daily. 90 tablet 3   Blood Glucose Monitoring Suppl (BLOOD GLUCOSE MONITOR SYSTEM) W/DEVICE KIT by Does not apply route. Use to check sugar once daily and as needed Dx: E11.9 **ONE TOUCH VERIO**     Budeson-Glycopyrrol-Formoterol (BREZTRI AEROSPHERE) 160-9-4.8 MCG/ACT AERO Inhale 2 puffs into the lungs 2 (two) times daily. 32.1 g 3   carvedilol (COREG) 6.25 MG tablet TAKE 1 TABLET BY MOUTH TWICE A DAY WITH FOOD 180 tablet 0   Cholecalciferol (VITAMIN D) 50 MCG (2000 UT) CAPS Take 1 capsule (2,000 Units total) by mouth daily. 30 capsule    dapagliflozin propanediol (FARXIGA) 10 MG TABS tablet Take 1 tablet (10 mg total) by mouth daily before breakfast. 90 tablet 3   Docusate Calcium (STOOL SOFTENER  PO) Take 1 tablet by mouth daily.     Fe Fum-Fe Poly-Vit C-Lactobac (FUSION PO) Take by mouth. Patient states received sample.  Unsure of mg.     FLUoxetine (PROZAC) 40 MG capsule Take 1 capsule (40 mg total) by mouth daily. 30 capsule 6   fluticasone (FLONASE) 50 MCG/ACT nasal spray Place 2  sprays into both nostrils daily. 48 mL 3   furosemide (LASIX) 80 MG tablet TAKE 1 TABLET BY MOUTH 2 TIMES DAILY. 180 tablet 4   glimepiride (AMARYL) 4 MG tablet Take 1 tablet (4 mg total) by mouth daily with breakfast. 90 tablet 1   glucose blood (ONETOUCH VERIO) test strip Check blood sugar 3 times a day 300 strip 3   ipratropium-albuterol (DUONEB) 0.5-2.5 (3) MG/3ML SOLN TAKE 3 MLS BY NEBULIZATION EVERY 6 (SIX) HOURS AS NEEDED (SEVERE SHORTNESS OF BREATH/WHEEZING). 360 mL 0   metFORMIN (GLUCOPHAGE) 1000 MG tablet TAKE 1 TABLET (1,000 MG TOTAL) BY MOUTH TWICE A DAY WITH FOOD 180 tablet 4   metolazone (ZAROXOLYN) 2.5 MG tablet Take 1 tablet (2.5 mg total) by mouth once a week.     montelukast (SINGULAIR) 10 MG tablet TAKE 1 TABLET BY MOUTH EVERYDAY AT BEDTIME 90 tablet 3   OVER THE COUNTER MEDICATION Take 1 tablet by mouth daily. Superbeets     OVER THE COUNTER MEDICATION Take 1 capsule by mouth daily at 6 (six) AM. [place] sea moss     pantoprazole (PROTONIX) 40 MG tablet TAKE 1 TABLET (40 MG TOTAL) BY MOUTH TWICE A DAY BEFORE MEALS 180 tablet 4   potassium chloride (KLOR-CON M) 10 MEQ tablet Take 1 tablet (10 mEq total) by mouth daily. And an extra one on the days you take metolazone 38 tablet 5   Probiotic Product (PROBIOTIC DAILY PO) Take 1 tablet by mouth daily.     Saw Palmetto 450 MG CAPS Take 1 capsule (450 mg total) by mouth daily.  0   simvastatin (ZOCOR) 20 MG tablet TAKE 1 TABLET BY MOUTH EVERY DAY IN THE EVENING 90 tablet 1   Turmeric (QC TUMERIC COMPLEX PO) Take by mouth.     vitamin B-12 (CYANOCOBALAMIN) 1000 MCG tablet Take 1 tablet (1,000 mcg total) by mouth every Monday, Wednesday, and Friday. (Patient taking differently: Take 1,000 mcg by mouth daily.)     warfarin (COUMADIN) 5 MG tablet TAKE 1/2 TABLET BY MOUTH DAILY EXCEPT TAKE 1 TABLET ON MONDAYS, WEDNESDAYS AND FRIDAYS OR AS DIRECTED BY ANTICOAGULATION CLINIC 60 tablet 1   methocarbamol (ROBAXIN) 500 MG tablet Take 1 tablet  (500 mg total) by mouth 3 (three) times daily as needed for muscle spasms (sedation precautions). (Patient not taking: Reported on 07/26/2022) 30 tablet 0   triamcinolone cream (KENALOG) 0.1 % Apply 1 Application topically 2 (two) times daily. For max 2 weeks at a time (Patient not taking: Reported on 07/26/2022) 45 g 0   No facility-administered medications prior to visit.     Per HPI unless specifically indicated in ROS section below Review of Systems  Objective:  BP 112/62   Pulse 63   Temp 97.7 F (36.5 C) (Temporal)   Ht 5\' 11"  (1.803 m)   Wt 222 lb (100.7 kg)   SpO2 93%   BMI 30.96 kg/m   Wt Readings from Last 3 Encounters:  07/26/22 222 lb (100.7 kg)  07/14/22 232 lb (105.2 kg)  07/02/22 233 lb 2 oz (105.7 kg)      Physical Exam Vitals and nursing note  reviewed.  Constitutional:      Appearance: Normal appearance. He is not ill-appearing.  HENT:     Head: Normocephalic and atraumatic.     Mouth/Throat:     Mouth: Mucous membranes are moist.     Pharynx: Oropharynx is clear. No oropharyngeal exudate or posterior oropharyngeal erythema.  Eyes:     Extraocular Movements: Extraocular movements intact.     Pupils: Pupils are equal, round, and reactive to light.  Cardiovascular:     Rate and Rhythm: Normal rate and regular rhythm.     Pulses: Normal pulses.     Heart sounds: Normal heart sounds. No murmur heard. Pulmonary:     Effort: Pulmonary effort is normal. No respiratory distress.     Breath sounds: No wheezing, rhonchi or rales.     Comments: Bibasilar crackles that partly clear with cough Abdominal:     General: Bowel sounds are normal. There is no distension.     Palpations: Abdomen is soft. There is no mass.     Tenderness: There is no abdominal tenderness. There is no right CVA tenderness, left CVA tenderness, guarding or rebound. Negative signs include Murphy's sign.     Hernia: A hernia is present. Hernia is present in the umbilical area (nontender).   Musculoskeletal:     Right lower leg: No edema.     Left lower leg: No edema.  Skin:    General: Skin is warm and dry.     Findings: No rash.  Neurological:     Mental Status: He is alert.  Psychiatric:        Mood and Affect: Mood normal.        Behavior: Behavior normal.       Results for orders placed or performed in visit on 07/22/22  POCT INR  Result Value Ref Range   INR 2.1 2.0 - 3.0   Lab Results  Component Value Date   HGBA1C 6.9 (H) 04/20/2022   Assessment & Plan:   Problem List Items Addressed This Visit     Obstructive sleep apnea   Type 2 diabetes mellitus with other specified complication   Exertional dyspnea   Umbilical hernia without obstruction and without gangrene   Mass of subcutaneous tissue of back   Symptomatic anemia   Other Visit Diagnoses     Pre-op evaluation    -  Primary   Relevant Orders   DG Chest 2 View   CBC with Differential/Platelet   Comprehensive metabolic panel   Fructosamine   Hemoglobin A1c   Brain natriuretic peptide        No orders of the defined types were placed in this encounter.   Orders Placed This Encounter  Procedures   DG Chest 2 View    Standing Status:   Future    Standing Expiration Date:   07/26/2023    Order Specific Question:   Reason for Exam (SYMPTOM  OR DIAGNOSIS REQUIRED)    Answer:   preop eval    Order Specific Question:   Preferred imaging location?    Answer:   [facility]   CBC with Differential/Platelet   Comprehensive metabolic panel   Fructosamine   Hemoglobin A1c   Brain natriuretic peptide    Patient Instructions  Update chest xray and labwork today.  You will need to call [facility] clinic Dr Alveria Apley office for cardiac clearance.  We will be in touch with results and forward to Dr Mont Dutton office.  Ok to hold coumadin  starting April 6th.  Ok to hold metformin on the day of surgery.   Follow up plan: Return if symptoms worsen or fail to improve.  [name], MD

## 2022-07-26 NOTE — Patient Instructions (Addendum)
Update chest xray and labwork today.  You will need to call [facility] clinic Dr Alveria Apley office for cardiac clearance.  We will be in touch with results and forward to Dr Mont Dutton office.  Ok to hold coumadin starting April 6th.  Ok to hold metformin on the day of surgery.

## 2022-07-28 ENCOUNTER — Encounter
Admission: RE | Admit: 2022-07-28 | Discharge: 2022-07-28 | Disposition: A | Discharge status: Home / Self Care | Payer: Medicare Other | Source: Ambulatory Visit | Attending: Surgery | Admitting: Surgery

## 2022-07-28 ENCOUNTER — Other Ambulatory Visit: Payer: Self-pay | Admitting: Family Medicine

## 2022-07-28 ENCOUNTER — Encounter: Payer: Self-pay | Admitting: Family Medicine

## 2022-07-28 DIAGNOSIS — Z01818 Encounter for other preprocedural examination: Secondary | ICD-10-CM

## 2022-07-28 DIAGNOSIS — I5022 Chronic systolic (congestive) heart failure: Secondary | ICD-10-CM | POA: Diagnosis not present

## 2022-07-28 DIAGNOSIS — I420 Dilated cardiomyopathy: Secondary | ICD-10-CM | POA: Diagnosis not present

## 2022-07-28 DIAGNOSIS — E1169 Type 2 diabetes mellitus with other specified complication: Secondary | ICD-10-CM

## 2022-07-28 DIAGNOSIS — E119 Type 2 diabetes mellitus without complications: Secondary | ICD-10-CM | POA: Diagnosis not present

## 2022-07-28 DIAGNOSIS — I2510 Atherosclerotic heart disease of native coronary artery without angina pectoris: Secondary | ICD-10-CM | POA: Diagnosis not present

## 2022-07-28 DIAGNOSIS — I4892 Unspecified atrial flutter: Secondary | ICD-10-CM | POA: Diagnosis not present

## 2022-07-28 DIAGNOSIS — N289 Disorder of kidney and ureter, unspecified: Secondary | ICD-10-CM | POA: Insufficient documentation

## 2022-07-28 DIAGNOSIS — I4891 Unspecified atrial fibrillation: Secondary | ICD-10-CM | POA: Diagnosis not present

## 2022-07-28 DIAGNOSIS — I714 Abdominal aortic aneurysm, without rupture, unspecified: Secondary | ICD-10-CM | POA: Diagnosis not present

## 2022-07-28 DIAGNOSIS — G4733 Obstructive sleep apnea (adult) (pediatric): Secondary | ICD-10-CM | POA: Diagnosis not present

## 2022-07-28 HISTORY — DX: Gastro-esophageal reflux disease without esophagitis: K21.9

## 2022-07-28 MED ORDER — METFORMIN HCL 500 MG PO TABS: 500.0000 mg | ORAL_TABLET | Freq: Two times a day (BID) | ORAL | 1 refills | Status: DC

## 2022-07-28 NOTE — Assessment & Plan Note (Signed)
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  Rec drop metformin to 500mg  BID, stop weekly metolazone.  Rec push fluids over weekend and rpt Cr on Monday.

## 2022-07-28 NOTE — Assessment & Plan Note (Signed)
Chronic, stable - multifactorial including COPD, CHF, anemia.

## 2022-07-28 NOTE — Assessment & Plan Note (Addendum)
8cm sebaceous cyst by prior CT.

## 2022-07-28 NOTE — Assessment & Plan Note (Addendum)
On amiodarone and carvedilol through cardiology and coumadin managed by our office. Cardiac clearance to come through cardiology clinic. See below re: [facility] management.

## 2022-07-28 NOTE — Assessment & Plan Note (Addendum)
Continues breztri with duonebs.  Recent PFTs pending results.  Update CXR

## 2022-07-28 NOTE — Assessment & Plan Note (Addendum)
Chronic, great control on farxiga 10mg  daily metformin 1000mg  bid and amaryl 4mg  daily - update A1c and kidney function.  Recommend hold farxiga 3 days prior to surgery Recommend hold metformin 2 days prior to surgery Recommend hold glimepiride (Amaryl) on morning of surgery   ADDENDUM ==> A1c 6%, Cr 1.54, GFR 46 - will drop metformin dose to 500mg  BID

## 2022-07-28 NOTE — Assessment & Plan Note (Signed)
Severe, has declined treatment due to CPAP intolerance.  Not [facility] candidate.

## 2022-07-28 NOTE — Assessment & Plan Note (Signed)
Pending repair with upcoming surgery

## 2022-07-28 NOTE — Assessment & Plan Note (Signed)
Continue statin. 

## 2022-07-28 NOTE — Assessment & Plan Note (Signed)
Update labs. Received iron infusion 06/2022

## 2022-07-28 NOTE — Addendum Note (Signed)
Addended by: [name] on: 07/28/2022 06:59 PM   Modules accepted: Orders

## 2022-07-28 NOTE — Assessment & Plan Note (Signed)
BP stable on current regimen - carvedilol 6.25mg  BID, farxiga 10mg  daily, lasix 80mg  BID with metolazone 2.5mg  once weekly and Klor-con 23mEq daily (64mEq on days he takes metolazone).

## 2022-07-28 NOTE — Assessment & Plan Note (Addendum)
Cardiac clearance through cardiologist. Here for evaluation for medical clearance for upcoming sebaceous cyst removal and large umbilical hernia repair via general anesthesia.  He is at increased risk of complication due to comorbidities including anemia and untreated sleep apnea. He regardless desires to proceed with surgical intervention - will update labwork and chest xray today and if stable, anticipate benefits outweigh risks of planned surgical intervention.  Will forward results to requesting surgical office.  Recommend hold coumadin x5d prior to surgery, hold farxiga x3d prior to surgery, hold metformin x2d prior to surgery and hold glimepiride on morning of surgery.

## 2022-07-28 NOTE — Assessment & Plan Note (Addendum)
Carries diagnosis of systolic CHF however latest EF 60% (2023).  Crackles on lung exam - check CXR.  Pending cardiac clearance.

## 2022-07-28 NOTE — Patient Instructions (Signed)
Your procedure is scheduled on:08-05-22 Thursday Report to the Registration Desk on the 1st floor of the [facility].Then proceed to the 2nd floor Surgery Desk To find out your arrival time, please call [phone] between 1PM - 3PM on:08-04-22 Wednesday If your arrival time is 6:00 am, do not arrive before that time as the [facility] entrance doors do not open until 6:00 am.  REMEMBER: Instructions that are not followed completely may result in serious medical risk, up to and including death; or upon the discretion of your surgeon and anesthesiologist your surgery may need to be rescheduled.  Do not eat food after midnight the night before surgery.  No gum chewing or hard candies.  You may however, drink Water up to 2 hours before you are scheduled to arrive for your surgery. Do not drink anything within 2 hours of your scheduled arrival time.  One week prior to surgery: Stop Anti-inflammatories (NSAIDS) such as Advil, Aleve, Ibuprofen, Motrin, Naproxen, Naprosyn and Aspirin based products such as Excedrin, Goody's Powder, BC Powder.You may however, continue to take Tylenol if needed for pain up until the day of surgery. Stop ANY OVER THE COUNTER supplements/vitmains until after surgery (Patient has previously stopped these already)  Stop your warfarin (COUMADIN) 5 days prior to surgery as instructed by PCP-last dose will be on 07-30-22 Friday  Stop your dapagliflozin propanediol (FARXIGA) 3 days prior to surgery-Last dose will be on 08-01-22 Sunday  Stop your metFORMIN (GLUCOPHAGE) 2 days prior to surgery-Last dose will be on 08-02-22 Monday  TAKE ONLY THESE MEDICATIONS THE MORNING OF SURGERY WITH A SIP OF WATER: -amiodarone (PACERONE)  -carvedilol (COREG)  -FLUoxetine (PROZAC)  -pantoprazole (PROTONIX)  -potassium chloride (KLOR-CON M)   Use your BREZTRI AEROSPHERE and your Albuterol Inhaler the day of surgery and bring your Albuterol Inhaler to the hospital  No Alcohol for 24 hours  before or after surgery.  No Smoking including e-cigarettes for 24 hours before surgery.  No chewable tobacco products for at least 6 hours before surgery.  No nicotine patches on the day of surgery.  Do not use any "recreational" drugs for at least a week (preferably 2 weeks) before your surgery.  Please be advised that the combination of cocaine and anesthesia may have negative outcomes, up to and including death. If you test positive for cocaine, your surgery will be cancelled.  On the morning of surgery brush your teeth with toothpaste and water, you may rinse your mouth with mouthwash if you wish. Do not swallow any toothpaste or mouthwash.  Use CHG Soap as directed on instruction sheet.  Do not wear jewelry, make-up, hairpins, clips or nail polish.  Do not wear lotions, powders, or perfumes.   Do not shave body hair from the neck down 48 hours before surgery.  Contact lenses, hearing aids and dentures may not be worn into surgery.  Do not bring valuables to the hospital. [facility] is not responsible for any missing/lost belongings or valuables.   Notify your doctor if there is any change in your medical condition (cold, fever, infection).  Wear comfortable clothing (specific to your surgery type) to the hospital.  After surgery, you can help prevent lung complications by doing breathing exercises.  Take deep breaths and cough every 1-2 hours. Your doctor may order a device called an Incentive Spirometer to help you take deep breaths. When coughing or sneezing, hold a pillow firmly against your incision with both hands. This is called "splinting." Doing this helps protect  your incision. It also decreases belly discomfort.  If you are being admitted to the hospital overnight, leave your suitcase in the car. After surgery it may be brought to your room.  In case of increased patient census, it may be necessary for you, the patient, to continue your postoperative care in the  Same Day Surgery department.  If you are being discharged the day of surgery, you will not be allowed to drive home. You will need a responsible individual to drive you home and stay with you for 24 hours after surgery.   If you are taking public transportation, you will need to have a responsible individual with you.  Please call the [facility] at [phone] if you have any questions about these instructions.  Surgery Visitation Policy:  Patients having surgery or a procedure may have two visitors.  Children under the age of 34 must have an adult with them who is not the patient.

## 2022-07-28 NOTE — Assessment & Plan Note (Addendum)
Chronic, longstanding, has seen heme and GI s/p iron infusions (latest 06/2022) and reassuring endoscopy and colonoscopy, declined to complete capsule endoscopy.  Update labs today.

## 2022-07-28 NOTE — Assessment & Plan Note (Addendum)
On longterm AC for atrial fibrillation.  Latest INR 2.1 (07/22/2022).  Will recommend holding coumadin x 5 days prior to surgery with recommencement as soon as deemed safe by surgeon. Will defer lovenox bridging in h/o symptomatic iron deficiency anemia.

## 2022-07-28 NOTE — Assessment & Plan Note (Signed)
Multifactorial - COPD, CHF, OSA.  He continues nocturnal supplemental oxygen use.

## 2022-07-29 ENCOUNTER — Telehealth: Payer: Self-pay | Admitting: Family Medicine

## 2022-07-29 LAB — FRUCTOSAMINE: Fructosamine: 237 umol/L (ref 205–285)

## 2022-07-29 NOTE — Telephone Encounter (Signed)
Called patient see lab results notes. Did not leave information on voicemail there are several things I need to go over to make sure patient does not have any questions. Closing this message see results note for further information.

## 2022-07-29 NOTE — Telephone Encounter (Signed)
Patient returned call to [facility] regarding his lab results. He said that he said he is  about to leave out for the day but its okay for a vm with the results to be left.

## 2022-07-30 ENCOUNTER — Encounter: Payer: Self-pay | Admitting: Surgery

## 2022-07-30 NOTE — Progress Notes (Unsigned)
Medical Clearance has been received from Dr Sharen Hones. The patient is cleared at Medium risk for surgery. He may hold his Coumadin for 5 days prior to surgery. Recommendation to hold Farxiga for 3 days prior and his Metformin for 2 days prior to surgery.

## 2022-07-30 NOTE — Progress Notes (Signed)
Perioperative / Anesthesia Services  Pre-Admission Testing Clinical Review / Preoperative Anesthesia Consult  Date: 08/03/22  Patient Demographics:  Name: [name] DOB:   1955/09/13 MRN:   [id number]  Planned Surgical Procedure(s):    Case: [id number] Date/Time: 08/05/22 1045   Procedures:      XI ROBOT ASSISTED UMBILICAL HERNIA REPAIR     CYST REMOVAL, excision of back cysts x 2   Anesthesia type: General   Pre-op diagnosis:      umbilical hernia, reducible 3.5 cm      sebaceous cyst of back 8 cm 1.5 cm   Location: [facility] OR ROOM 04 / [facility]   Surgeons: Henrene Dodge, MD     NOTE: Available PAT nursing documentation and vital signs have been reviewed. Clinical nursing staff has updated patient's PMH/PSHx, current medication list, and drug allergies/intolerances to ensure comprehensive history available to assist in medical decision making as it pertains to the aforementioned surgical procedure and anticipated anesthetic course. Extensive review of available clinical information personally performed. [place] [facility] and PSHx updated with any diagnoses/procedures that  may have been inadvertently omitted during his intake with the pre-admission testing department's nursing staff.  Clinical Discussion:  [name] is a 67 y.o. male who is submitted for pre-surgical anesthesia review and clearance prior to him undergoing the above procedure. Patient is a Former Smoker (31 pack years; quit 04/2018). Pertinent PMH includes: CAD, DCM, CHF, atrial fibrillation/flutter, infrarenal AAA, aortic atherosclerosis, angina, HTN, HLD, T2DM, COPD, OSAH (does not use nocturnal PAP therapy; supplemental oxygen at bedtime), GERD (on daily PPI), anemia, narcolepsy, OA, umbilical hernia, anxiety.  Patient is followed by cardiology Gwen Pounds, MD). He was last seen in the cardiology clinic on 07/28/2022; notes reviewed. At the time of his clinic visit, patient doing well  overall from a cardiovascular perspective. Patient denied any chest pain, shortness of breath, PND, orthopnea, palpitations, significant peripheral edema, weakness, fatigue, vertiginous symptoms, or presyncope/syncope. Patient with a past medical history significant for cardiovascular diagnoses. Documented physical exam was grossly benign, providing no evidence of acute exacerbation and/or decompensation of the patient's known cardiovascular conditions.  Diagnostic LEFT heart catheterization performed on 07/15/2010 revealing multivessel CAD; 30% proximal LAD, 40% D1, 25% proximal LCx-1, 30% proximal LCx-2, and 30% mid LCx.  Given the nonobstructive nature of his coronary artery disease, the decision was made to defer intervention opting for medical management.  Patient with a history of a dilated cardiomyopathy that was diagnosed in 2013, at which time his EF was down to 25%.  Cardiac function has been monitored since diagnosis.  Most recent TTE was performed on 11/19/2021 revealing a normal left ventricular systolic function with an EF of 60 to 65%.  Left ventricular diastolic Doppler parameters consistent with pseudonormalization (G2DD). RVSF was moderately reduced.  There was mild left atrial enlargement.  Moderate mitral annular calcification noted.  Aortic root dilated at 37 mm.  All transvalvular gradients were noted to be normal providing no evidence suggestive of valvular stenosis.  Patient with an atrial fibrillation diagnosis; CHA2DS2-VASc Score = 5 (age, CHF, vascular disease history, T2DM). Patient underwent DCCV procedure on 10/27/2010.he received a single 100 J synchronized cardioversion restoring NSR.  Patient with recurrent atrial arrhythmia.  Ultimately underwent cardiac ablation on 09/21/2011.  His rate and rhythm are currently being maintained on oral amiodarone + carvedilol.  Of note, patient previously on dronedarone, however due to financial constraints, he was switched to long-term  amiodarone therapy.  He is  chronically anticoagulated using warfarin; reported to be compliant with therapy with no evidence or reports of GI bleeding.  Blood pressure well controlled at 110/58 mmHg on currently prescribed CCB (amiodarone), beta-blocker (carvedilol), and diuretic (furosemide) therapies. He is on a simvastatin for his HLD diagnosis and further ASCVD prevention. T2DM well controlled on currently prescribed regimen; last HgbA1c was 6.0% when checked on 07/26/2022.  In the setting of known cardiovascular diagnoses and concurrent T2DM, patient is on an SGLT2i (dapagliflozin) for both cardiovascular and renovascular protection.  Patient does have an OSAH diagnosis, however due to issues with tolerating mask required for nocturnal PAP therapy, patient is noncompliant.  Cardiology discussed risk of untreated OSAH in the setting of known atrial fibrillation.  Patient verbalized understanding and advised that he would make efforts to retry prescribed PAP therapy.  Patient does require supplemental oxygen at night. Functional capacity, as defined by DASI, is documented as being >/= 4 METS.  No changes were made to his medication regimen.  Patient to follow-up with outpatient cardiology in 6 months or sooner if needed.  [name] is scheduled for an elective XI ROBOT ASSISTED UMBILICAL HERNIA REPAIR; SEBACEOUS CYST REMOVAL x 2 (BACK) on 08/05/2022 with Dr. Rudean Curt, MD.  Given patient's past medical history significant for cardiovascular diagnoses, presurgical cardiac clearance was sought by the PAT team.  Additionally, surgeons office also requested clearance from patient's primary care provider.  Clearances were obtained as follows.  Per internal/family medicine Sharen Hones, MD), "patient is optimized for surgery and may proceed with the planned surgical intervention at an overall MODERATE risk of perioperative complications".  Per cardiology Madaline Guthrie, PA-C), "this patient is optimized for  surgery and may proceed with the planned procedural course with a LOW risk of significant perioperative cardiovascular complications".  Again, this patient is on daily oral anticoagulation therapy.  He has been instructed on recommendations from his cardiologist for holding his warfarin dose for 5 days prior to his procedure with plans to restart as soon as postoperative bleeding respectively minimized by his primary attending surgeon.  The patient is aware that his last dose of warfarin will be on 07/30/2022.  Cardiology indicating that patient will not need enoxaparin bridging for this procedure.  Patient denies previous perioperative complications with anesthesia in the past. In review of the available records, it is noted that patient underwent a general anesthetic course here at [facility] (ASA III) in 04/2022 without documented complications.      07/26/2022   11:13 AM 07/14/2022   10:56 AM 07/12/2022    1:28 PM  Vitals with BMI  Height 5\' 11"  5\' 11"    Weight 222 lbs 232 lbs   BMI 30.98 32.37   Systolic 112 145 203  Diastolic 62 80 73  Pulse 63 58     Providers/Specialists:   NOTE: Primary physician provider listed below. Patient may have been seen by APP or partner within same practice.   PROVIDER ROLE / SPECIALTY LAST Eligha Bridegroom, MD General Surgery (Surgeon) 07/14/2022  Eustaquio Boyden, MD Primary Care Provider 07/26/2022  Arnoldo Hooker, MD Cardiology 07/28/2022   Allergies:  Atorvastatin and Lisinopril  Current Home Medications:   No current facility-administered medications for this encounter.    ACCU-CHEK FASTCLIX LANCETS MISC   albuterol (VENTOLIN HFA) 108 (90 Base) MCG/ACT inhaler   amiodarone (PACERONE) 200 MG tablet   Blood Glucose Monitoring Suppl (BLOOD GLUCOSE MONITOR SYSTEM) W/DEVICE KIT   Budeson-Glycopyrrol-Formoterol (BREZTRI AEROSPHERE) 160-9-4.8 MCG/ACT AERO  carvedilol (COREG) 6.25 MG tablet   Cholecalciferol  (VITAMIN D) 50 MCG (2000 UT) CAPS   dapagliflozin propanediol (FARXIGA) 10 MG TABS tablet   Docusate Calcium (STOOL SOFTENER PO)   Fe Fum-Fe Poly-Vit C-Lactobac (FUSION PO)   FLUoxetine (PROZAC) 40 MG capsule   fluticasone (FLONASE) 50 MCG/ACT nasal spray   furosemide (LASIX) 80 MG tablet   glimepiride (AMARYL) 4 MG tablet   glucose blood (ONETOUCH VERIO) test strip   ipratropium-albuterol (DUONEB) 0.5-2.5 (3) MG/3ML SOLN   metFORMIN (GLUCOPHAGE) 500 MG tablet   methocarbamol (ROBAXIN) 500 MG tablet   montelukast (SINGULAIR) 10 MG tablet   OVER THE COUNTER MEDICATION   OVER THE COUNTER MEDICATION   OXYGEN   pantoprazole (PROTONIX) 40 MG tablet   potassium chloride (KLOR-CON M) 10 MEQ tablet   Probiotic Product (PROBIOTIC DAILY PO)   Saw Palmetto 450 MG CAPS   simvastatin (ZOCOR) 20 MG tablet   Turmeric (QC TUMERIC COMPLEX PO)   vitamin B-12 (CYANOCOBALAMIN) 1000 MCG tablet   warfarin (COUMADIN) 5 MG tablet   History:   Past Medical History:  Diagnosis Date   Adrenal nodule 12/28/2021   a.) CT abd/pel 12/28/2021: LEFT adrenal nodule measuring 1.6 cm   Anemia    Angina    Anxiety    Aortic atherosclerosis    Arthritis    Atrial fibrillation and flutter 2013   a.) CHA2DS2VASc = 5 (age, CHF, HTN, vascular disease history, T2DM);  b.) s/p DCCV (100 J) 10/27/2010; c.) s/p ablation 09/21/2011; d.) rate/rhythm maintained on oral amiodarone + carvedilol; chronically anticoagulated with warfarin   CAD (coronary artery disease) 07/15/2010   a.) LHC 07/15/2010: 30% pLAD, 40% D1, 25%/30% pLCx, 30% mLCx - med mgmt   CHF (congestive heart failure) - NYHA Class II/III 2013   a.) TTE 02/26/2013: EF 50%, mild MR/TR; b.) EST 06/13/2017: EF 50%, mild LCH, triv MR/TR; c.) TTTE 03/24/2020: EF >55%, mild LVH, mild MAC, mild BAE, triv PR, mild MR/TR, G1DD; d.) TTE 11/19/2021: EF 60-65%, mod reduced RVSF, mild LAE, mod MAC, Ao root 37 mm, G2DD   COPD (chronic obstructive pulmonary disease)     Diabetes type 2, uncontrolled    declines DSME   Dilated cardiomyopathy 2013   a.) EF 25% at time of Dx in 2013; b.) TTE 02/26/2013: EF 50%; c.) EST 06/13/2017: EF 50%;  d.) TTE 03/24/2020: EF >55%; e.) TTE 11/19/2021: EF 60-65%   Ex-smoker    GERD (gastroesophageal reflux disease)    Hepatic steatosis    Hyperlipidemia    Hypertension    Infrarenal abdominal aortic aneurysm (AAA) without rupture 08/11/2016   a.) CT abd/pel 08/11/2016: 3.3 cm; b.) CT abd/pel 12/22/2018: 3.3 cm; c.) CT abd/pel 08/31/2021: 3.7 cm; d.) CT abd/pel 12/28/2021: 3.9 cm   Long term current use of amiodarone 2023   a.) previously on dranaderone (cost prohibitive); switched to amiodarone 2023   Long term current use of anticoagulant    a.) warfarin   Marijuana use    a.) UDS (+) of THC x 3 (02/06/2013, 06/29/2013, 09/03/2013); PCP advised "one more and we will stop prescribing ativan (08/2013)"   Migraine    Narcolepsy    Obesity    OSA (obstructive sleep apnea)    a.) unable to tolerate nocturnal PAP therapy; uses supplemetal oxygen at 2L/[place] during sleep   Seasonal allergies    Umbilical hernia    Past Surgical History:  Procedure Laterality Date   ATRIAL FLUTTER ABLATION N/A 09/21/2011   Procedure:  ATRIAL FLUTTER ABLATION;  Surgeon: Hillis Range, MD;  Location: [facility] CATH LAB;  Service: Cardiovascular;  Laterality: N/A;   CARDIAC ELECTROPHYSIOLOGY MAPPING AND ABLATION  2013   for atrial flutter   CARDIOVASCULAR STRESS TEST  03/2012   ETT WNL Gwen Pounds)   CARDIOVERSION N/A 08/12/2021   Procedure: CARDIOVERSION;  Surgeon: Lamar Blinks, MD;  Location: [facility] ORS;  Service: Cardiovascular;  Laterality: N/A;   COLONOSCOPY WITH PROPOFOL N/A 05/22/2020   TA, diverticulosis, descending colon ulcer biopsy WNL, int hem Servando Snare, Darren, MD)   COLONOSCOPY WITH PROPOFOL N/A 04/28/2022   multiple TAs - 10 small, 2 1.2cm, 2 9mm, rpt 3 yrs (Vanga, Loel Dubonnet, MD)   ESOPHAGOGASTRODUODENOSCOPY (EGD) WITH PROPOFOL N/A  05/22/2020   WNL (Wohl)   ESOPHAGOGASTRODUODENOSCOPY (EGD) WITH PROPOFOL N/A 04/28/2022   12mm duodenal TA, reactive gastropathy, enteric mucosa in stomach biopsy of unclear significance Allegra Lai, Loel Dubonnet, MD)   GIVENS CAPSULE STUDY  04/28/2022   Procedure: GIVENS CAPSULE STUDY;  Surgeon: Toney Reil, MD;  Location: [facility] ENDOSCOPY;  Service: Endoscopy;;   US ECHOCARDIOGRAPHY  2014   EF 50%, nl LV fxn, RV nl size/function, mild mitral insuff   Family History  Problem Relation Age of Onset   Heart failure Father 29   Cancer Mother 3       NHL   Hypertension Maternal Grandfather    CAD Maternal Grandfather 10       MI   Diabetes Other        grandparents   Cancer Brother 64       lung (smoker)   Social History   Tobacco Use   Smoking status: Former    Packs/day: 1.00    Years: 31.00    Additional pack years: 0.00    Total pack years: 31.00    Types: Cigarettes    Quit date: 04/26/2018    Years since quitting: 4.2    Passive exposure: Past   Smokeless tobacco: Never  Vaping Use   Vaping Use: Never used  Substance Use Topics   Alcohol use: No    Alcohol/week: 0.0 standard drinks of alcohol   Drug use: Yes    Types: Marijuana    Pertinent Clinical Results:  LABS:   No visits with results within 3 Day(s) from this visit.  Latest known visit with results is:  Office Visit on 07/26/2022  Component Date Value Ref Range Status   WBC 07/26/2022 10.2  4.0 - 10.5 K/uL Final   RBC 07/26/2022 4.41  4.22 - 5.81 Mil/uL Final   Hemoglobin 07/26/2022 12.3 (L)  13.0 - 17.0 g/dL Final   HCT 40/98/1191 37.1 (L)  39.0 - 52.0 % Final   MCV 07/26/2022 84.1  78.0 - 100.0 fl Final   MCHC 07/26/2022 33.3  30.0 - 36.0 g/dL Final   RDW 47/82/9562 22.9 (H)  11.5 - 15.5 % Final   Platelets 07/26/2022 290.0  150.0 - 400.0 K/uL Final   Neutrophils Relative % 07/26/2022 72.1  43.0 - 77.0 % Final   Lymphocytes Relative 07/26/2022 16.1  12.0 - 46.0 % Final   Monocytes Relative  07/26/2022 10.2  3.0 - 12.0 % Final   Eosinophils Relative 07/26/2022 1.2  0.0 - 5.0 % Final   Basophils Relative 07/26/2022 0.4  0.0 - 3.0 % Final   Neutro Abs 07/26/2022 7.3  1.4 - 7.7 K/uL Final   Lymphs Abs 07/26/2022 1.6  0.7 - 4.0 K/uL Final   Monocytes Absolute 07/26/2022 1.0  0.1 -  1.0 K/uL Final   Eosinophils Absolute 07/26/2022 0.1  0.0 - 0.7 K/uL Final   Basophils Absolute 07/26/2022 0.0  0.0 - 0.1 K/uL Final   Sodium 07/26/2022 138  135 - 145 mEq/L Final   Potassium 07/26/2022 3.9  3.5 - 5.1 mEq/L Final   Chloride 07/26/2022 91 (L)  96 - 112 mEq/L Final   CO2 07/26/2022 36 (H)  19 - 32 mEq/L Final   Glucose, Bld 07/26/2022 121 (H)  70 - 99 mg/dL Final   BUN 16/01/9603 20  6 - 23 mg/dL Final   Creatinine, Ser 07/26/2022 1.54 (H)  0.40 - 1.50 mg/dL Final   Total Bilirubin 07/26/2022 0.3  0.2 - 1.2 mg/dL Final   Alkaline Phosphatase 07/26/2022 59  39 - 117 U/L Final   AST 07/26/2022 20  0 - 37 U/L Final   ALT 07/26/2022 13  0 - 53 U/L Final   Total Protein 07/26/2022 7.2  6.0 - 8.3 g/dL Final   Albumin 54/12/8117 4.3  3.5 - 5.2 g/dL Final   GFR 14/78/2956 46.60 (L)  >60.00 mL/min Final   Calculated using the CKD-EPI Creatinine Equation (2021)   Calcium 07/26/2022 9.9  8.4 - 10.5 mg/dL Final   Fructosamine 21/30/8657 237  205 - 285 umol/L Final   Hgb A1c MFr Bld 07/26/2022 6.0  4.6 - 6.5 % Final   Glycemic Control Guidelines for People with Diabetes:Non Diabetic:  <6%Goal of Therapy: <7%Additional Action Suggested:  >8%    Pro B Natriuretic peptide (BNP) 07/26/2022 35.0  0.0 - 100.0 pg/mL Final    ECG: Date: 04/09/2022 Time ECG obtained: 1232 PM Rate: 74 bpm Rhythm: Junctional rhythm; IRBBB Axis (leads I and aVF): Rightward axis Intervals: PR 328 ms. QRS 114 ms. QTc 463 ms. ST segment and T wave changes:  No evidence of ST segment elevation or depression Comparison: Similar to previous tracing obtained on 02/23/2022   IMAGING / PROCEDURES: DIAGNOSTIC RADIOGRAPHS OF  CHEST 2 VIEWS performed on 07/26/2022 Lung volumes are normal.  Mild scarring in the right lung base, similar to prior studies.  No consolidative airspace disease.  No pleural effusions.  No pneumothorax.  No pulmonary nodule or mass noted.  Pulmonary vasculature and the cardiomediastinal silhouette are within normal limits.  Atherosclerotic calcifications in the thoracic aorta.  CT CERVICAL SPINE WO CONTRAST performed on 12/28/2021 No acute intracranial abnormality seen. Severe multilevel degenerative disc disease is noted in the cervical spine.  No fracture is noted.  CT CHEST ABDOMEN PELVIS W CONTRAST  performed on 12/28/2021 No acute findings are seen in CT scan of chest, abdomen and pelvis. There is no evidence of mediastinal or retroperitoneal hematoma. No displaced fractures are seen. Small linear patchy densities in the lower lung fields may suggest scarring and possibly atelectasis/pneumonia. There is possible minimal right pleural effusion. There is no pneumothorax. There is no demonstrable laceration in solid organs. There is no bowel wall thickening. Coronary artery disease. There is 1.6 cm left adrenal nodule. Follow-up multiphasic CT in 6 months may be considered. Small ascites. There is diffuse edema in subcutaneous plane suggesting anasarca. 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  PULMONARY FUNCTION TESTING performed on 07/20/2022    Latest Ref Rng & Units 07/20/2022    2:14 PM  PFT Results  FVC-Pre L 2.21   FVC-Predicted Pre % 46   FVC-Post L 2.50   FVC-Predicted Post % 52   Pre FEV1/FVC % % 64   Post FEV1/FCV % %  58   FEV1-Pre L 1.42   FEV1-Predicted Pre % 40   FEV1-Post L 1.45   DLCO uncorrected ml/min/mmHg 12.94   DLCO UNC% % 47   DLVA Predicted % 73   TLC L 4.63   TLC % Predicted % 64   RV % Predicted % 92    TRANSTHORACIC ECHOCARDIOGRAM performed on 11/19/2021 Left ventricular ejection fraction, by estimation, is 60 to 65%. The left  ventricle has normal function. The left ventricle has no regional wall motion abnormalities. There is mild left ventricular hypertrophy. Left ventricular diastolic parameters are consistent with Grade II diastolic dysfunction (pseudonormalization).  Right ventricular systolic function is moderately reduced. The right ventricular size is moderately enlarged. Tricuspid regurgitation signal is inadequate for assessing PA pressure.  Left atrial size was mildly dilated.  Right atrial size was mildly dilated.  The mitral valve is normal in structure. No evidence of mitral valve regurgitation. No evidence of mitral stenosis. Moderate mitral annular calcification. The aortic valve has an indeterminant number of cusps. Aortic valve regurgitation is not visualized. Aortic valve sclerosis is present, with no evidence of aortic valve stenosis.  There is borderline dilatation of the aortic root and of the ascending aorta, measuring 37 mm.  The inferior vena cava is dilated in size with >50% respiratory variability, suggesting right atrial pressure of 8 mmHg.   Impression and Plan:  [name] has been referred for pre-anesthesia review and clearance prior to him undergoing the planned anesthetic and procedural courses. Available labs, pertinent testing, and imaging results were personally reviewed by me in preparation for upcoming operative/procedural course. [facility] following extensive record review and patient interview with PAT staff.   This patient has been appropriately cleared by cardiology (LOW) and internal/family (MODERATE) with the individually indicated risk of significant perioperative cardiovascular complications. Based on clinical review performed today (08/03/22), barring any significant acute changes in the patient's overall condition, it is anticipated that he will be able to proceed with the planned surgical intervention. Any acute changes in clinical  condition may necessitate his procedure being postponed and/or cancelled. Patient will meet with anesthesia team (MD and/or CRNA) on the day of his procedure for preoperative evaluation/assessment. Questions regarding anesthetic course will be fielded at that time.   Pre-surgical instructions were reviewed with the patient during his PAT appointment, and questions were fielded to satisfaction by PAT clinical staff. He has been instructed on which medications that he will need to hold prior to surgery, as well as the ones that have been deemed safe/appropriate to take of the day of his procedure. As part of the general education provided by PAT, patient made aware both verbally and in writing, that he would need to abstain from the use of any illegal substances during his perioperative course.  He was advised that failure to follow the provided instructions could necessitate case cancellation or result serious perioperative complications up to and including death. Patient encouraged to contact PAT and/or his surgeon's office to discuss any questions or concerns that may arise prior to surgery; verbalized understanding.   [name] MullingBryan Sulo Janczak, MSN, APRN, FNP-C, CEN [facility]  Peri-operative Services Nurse Practitioner Phone: [phone] Fax: [phone] 08/03/22 8:50 AM  NOTE: This note has been prepared using Dragon dictation software. Despite my best ability to proofread, there is always the potential that unintentional transcriptional errors may still occur from this process.

## 2022-07-30 NOTE — Telephone Encounter (Signed)
Surgical clearance form completed by Dr. Sharen Hones on 07/28/22 and faxed to [facility] on 07/30/22. Form placed in scan batch.

## 2022-08-02 ENCOUNTER — Other Ambulatory Visit: Payer: Medicare Other

## 2022-08-04 MED ORDER — CEFAZOLIN SODIUM-DEXTROSE 2-4 GM/100ML-% IV SOLN
2.0000 g | INTRAVENOUS | Status: AC
  Administered 2022-08-05: 2 g via INTRAVENOUS

## 2022-08-04 MED ORDER — CHLORHEXIDINE GLUCONATE CLOTH 2 % EX PADS
6.0000 | MEDICATED_PAD | Freq: Once | CUTANEOUS | Status: AC
  Administered 2022-08-05: 6 via TOPICAL

## 2022-08-04 MED ORDER — GABAPENTIN 300 MG PO CAPS
300.0000 mg | ORAL_CAPSULE | ORAL | Status: AC
  Administered 2022-08-05: 300 mg via ORAL

## 2022-08-04 MED ORDER — BUPIVACAINE LIPOSOME 1.3 % IJ SUSP: 20.0000 mL | Freq: Once | INTRAMUSCULAR | Status: DC

## 2022-08-04 MED ORDER — CHLORHEXIDINE GLUCONATE 0.12 % MT SOLN
15.0000 mL | Freq: Once | OROMUCOSAL | Status: AC
  Administered 2022-08-05: 15 mL via OROMUCOSAL

## 2022-08-04 MED ORDER — ORAL CARE MOUTH RINSE: 15.0000 mL | Freq: Once | OROMUCOSAL | Status: AC

## 2022-08-04 MED ORDER — CHLORHEXIDINE GLUCONATE CLOTH 2 % EX PADS: 6.0000 | MEDICATED_PAD | Freq: Once | CUTANEOUS | Status: DC

## 2022-08-04 MED ORDER — ACETAMINOPHEN 500 MG PO TABS
1000.0000 mg | ORAL_TABLET | ORAL | Status: AC
  Administered 2022-08-05: 1000 mg via ORAL

## 2022-08-04 MED ORDER — SODIUM CHLORIDE 0.9 % IV SOLN: INTRAVENOUS | Status: DC

## 2022-08-05 ENCOUNTER — Ambulatory Visit: Payer: Medicare Other | Admitting: Urgent Care

## 2022-08-05 ENCOUNTER — Ambulatory Visit
Admission: RE | Admit: 2022-08-05 | Discharge: 2022-08-05 | Disposition: A | Discharge status: Home / Self Care | Payer: Medicare Other | Attending: Surgery | Admitting: Surgery

## 2022-08-05 ENCOUNTER — Encounter
Admission: RE | Disposition: A | Discharge status: Home / Self Care | Payer: Self-pay | Source: Home / Self Care | Attending: Surgery

## 2022-08-05 ENCOUNTER — Other Ambulatory Visit: Payer: Self-pay

## 2022-08-05 ENCOUNTER — Encounter: Payer: Self-pay | Admitting: Surgery

## 2022-08-05 DIAGNOSIS — Z6830 Body mass index (BMI) 30.0-30.9, adult: Secondary | ICD-10-CM | POA: Diagnosis not present

## 2022-08-05 DIAGNOSIS — K429 Umbilical hernia without obstruction or gangrene: Secondary | ICD-10-CM | POA: Diagnosis not present

## 2022-08-05 DIAGNOSIS — E669 Obesity, unspecified: Secondary | ICD-10-CM | POA: Insufficient documentation

## 2022-08-05 DIAGNOSIS — F419 Anxiety disorder, unspecified: Secondary | ICD-10-CM | POA: Insufficient documentation

## 2022-08-05 DIAGNOSIS — E1165 Type 2 diabetes mellitus with hyperglycemia: Secondary | ICD-10-CM | POA: Insufficient documentation

## 2022-08-05 DIAGNOSIS — I4891 Unspecified atrial fibrillation: Secondary | ICD-10-CM | POA: Diagnosis not present

## 2022-08-05 DIAGNOSIS — E785 Hyperlipidemia, unspecified: Secondary | ICD-10-CM | POA: Insufficient documentation

## 2022-08-05 DIAGNOSIS — I2510 Atherosclerotic heart disease of native coronary artery without angina pectoris: Secondary | ICD-10-CM | POA: Diagnosis not present

## 2022-08-05 DIAGNOSIS — G4733 Obstructive sleep apnea (adult) (pediatric): Secondary | ICD-10-CM | POA: Insufficient documentation

## 2022-08-05 DIAGNOSIS — I700 Atherosclerosis of aorta: Secondary | ICD-10-CM | POA: Diagnosis not present

## 2022-08-05 DIAGNOSIS — J449 Chronic obstructive pulmonary disease, unspecified: Secondary | ICD-10-CM | POA: Insufficient documentation

## 2022-08-05 DIAGNOSIS — G47419 Narcolepsy without cataplexy: Secondary | ICD-10-CM | POA: Insufficient documentation

## 2022-08-05 DIAGNOSIS — I110 Hypertensive heart disease with heart failure: Secondary | ICD-10-CM | POA: Insufficient documentation

## 2022-08-05 DIAGNOSIS — L723 Sebaceous cyst: Secondary | ICD-10-CM

## 2022-08-05 DIAGNOSIS — Z87891 Personal history of nicotine dependence: Secondary | ICD-10-CM | POA: Diagnosis not present

## 2022-08-05 DIAGNOSIS — I509 Heart failure, unspecified: Secondary | ICD-10-CM | POA: Insufficient documentation

## 2022-08-05 DIAGNOSIS — L720 Epidermal cyst: Secondary | ICD-10-CM | POA: Diagnosis not present

## 2022-08-05 DIAGNOSIS — K219 Gastro-esophageal reflux disease without esophagitis: Secondary | ICD-10-CM | POA: Diagnosis not present

## 2022-08-05 DIAGNOSIS — Z01818 Encounter for other preprocedural examination: Secondary | ICD-10-CM

## 2022-08-05 HISTORY — DX: Fatty (change of) liver, not elsewhere classified: K76.0

## 2022-08-05 HISTORY — PX: CYST EXCISION: SHX5701

## 2022-08-05 HISTORY — DX: Atherosclerosis of aorta: I70.0

## 2022-08-05 HISTORY — DX: Umbilical hernia without obstruction or gangrene: K42.9

## 2022-08-05 HISTORY — DX: Cannabis use, unspecified, uncomplicated: F12.90

## 2022-08-05 HISTORY — DX: Long term (current) use of anticoagulants: Z79.01

## 2022-08-05 LAB — GLUCOSE, CAPILLARY: Glucose-Capillary: 194 mg/dL — ABNORMAL HIGH (ref 70–99)

## 2022-08-05 LAB — POCT I-STAT, CHEM 8
BUN: 15 mg/dL (ref 8–23)
Calcium, Ion: 1.12 mmol/L — ABNORMAL LOW (ref 1.15–1.40)
Chloride: 92 mmol/L — ABNORMAL LOW (ref 98–111)
Creatinine, Ser: 1.1 mg/dL (ref 0.61–1.24)
Glucose, Bld: 156 mg/dL — ABNORMAL HIGH (ref 70–99)
HCT: 40 % (ref 39.0–52.0)
Hemoglobin: 13.6 g/dL (ref 13.0–17.0)
Potassium: 3.3 mmol/L — ABNORMAL LOW (ref 3.5–5.1)
Sodium: 139 mmol/L (ref 135–145)
TCO2: 35 mmol/L — ABNORMAL HIGH (ref 22–32)

## 2022-08-05 LAB — PROTIME-INR
INR: 1.1 (ref 0.8–1.2)
Prothrombin Time: 13.8 seconds (ref 11.4–15.2)

## 2022-08-05 SURGERY — REPAIR, HERNIA, UMBILICAL, ROBOT-ASSISTED
Anesthesia: General

## 2022-08-05 MED ORDER — CEFAZOLIN SODIUM-DEXTROSE 2-4 GM/100ML-% IV SOLN
INTRAVENOUS | Status: AC
  Filled 2022-08-05: qty 100

## 2022-08-05 MED ORDER — FENTANYL CITRATE (PF) 100 MCG/2ML IJ SOLN: 25.0000 ug | INTRAMUSCULAR | Status: DC | PRN

## 2022-08-05 MED ORDER — LIDOCAINE HCL (CARDIAC) PF 100 MG/5ML IV SOSY
PREFILLED_SYRINGE | INTRAVENOUS | Status: DC | PRN
  Administered 2022-08-05: 80 mg via INTRAVENOUS

## 2022-08-05 MED ORDER — ACETAMINOPHEN 500 MG PO TABS: 1000.0000 mg | ORAL_TABLET | Freq: Four times a day (QID) | ORAL | Status: AC | PRN

## 2022-08-05 MED ORDER — GABAPENTIN 300 MG PO CAPS
ORAL_CAPSULE | ORAL | Status: AC
  Filled 2022-08-05: qty 1

## 2022-08-05 MED ORDER — GLYCOPYRROLATE 0.2 MG/ML IJ SOLN
INTRAMUSCULAR | Status: DC | PRN
  Administered 2022-08-05: .2 mg via INTRAVENOUS

## 2022-08-05 MED ORDER — IPRATROPIUM-ALBUTEROL 0.5-2.5 (3) MG/3ML IN SOLN
RESPIRATORY_TRACT | Status: AC
  Filled 2022-08-05: qty 3

## 2022-08-05 MED ORDER — LACTATED RINGERS IV SOLN: INTRAVENOUS | Status: DC | PRN

## 2022-08-05 MED ORDER — ONDANSETRON HCL 4 MG/2ML IJ SOLN
INTRAMUSCULAR | Status: AC
  Filled 2022-08-05: qty 2

## 2022-08-05 MED ORDER — MIDAZOLAM HCL 2 MG/2ML IJ SOLN
INTRAMUSCULAR | Status: AC
  Filled 2022-08-05: qty 2

## 2022-08-05 MED ORDER — SODIUM CHLORIDE FLUSH 0.9 % IV SOLN
INTRAVENOUS | Status: DC | PRN
  Administered 2022-08-05: 20 mL
  Administered 2022-08-05: 10 mL
  Administered 2022-08-05: 70 mL

## 2022-08-05 MED ORDER — ONDANSETRON HCL 4 MG/2ML IJ SOLN
INTRAMUSCULAR | Status: DC | PRN
  Administered 2022-08-05: 4 mg via INTRAVENOUS

## 2022-08-05 MED ORDER — PHENYLEPHRINE HCL (PRESSORS) 10 MG/ML IV SOLN
INTRAVENOUS | Status: DC | PRN
  Administered 2022-08-05: 80 ug via INTRAVENOUS
  Administered 2022-08-05: 160 ug via INTRAVENOUS

## 2022-08-05 MED ORDER — PROPOFOL 10 MG/ML IV BOLUS
INTRAVENOUS | Status: DC | PRN
  Administered 2022-08-05: 30 mg via INTRAVENOUS
  Administered 2022-08-05: 40 mg via INTRAVENOUS
  Administered 2022-08-05: 100 mg via INTRAVENOUS

## 2022-08-05 MED ORDER — IPRATROPIUM-ALBUTEROL 0.5-2.5 (3) MG/3ML IN SOLN
3.0000 mL | Freq: Once | RESPIRATORY_TRACT | Status: AC
  Administered 2022-08-05: 3 mL via RESPIRATORY_TRACT

## 2022-08-05 MED ORDER — BUPIVACAINE-EPINEPHRINE (PF) 0.5% -1:200000 IJ SOLN
INTRAMUSCULAR | Status: AC
  Filled 2022-08-05: qty 30

## 2022-08-05 MED ORDER — DEXAMETHASONE SODIUM PHOSPHATE 10 MG/ML IJ SOLN
INTRAMUSCULAR | Status: DC | PRN
  Administered 2022-08-05: 5 mg via INTRAVENOUS

## 2022-08-05 MED ORDER — SUGAMMADEX SODIUM 200 MG/2ML IV SOLN
INTRAVENOUS | Status: DC | PRN
  Administered 2022-08-05: 200 mg via INTRAVENOUS

## 2022-08-05 MED ORDER — ROCURONIUM BROMIDE 100 MG/10ML IV SOLN
INTRAVENOUS | Status: DC | PRN
  Administered 2022-08-05: 60 mg via INTRAVENOUS
  Administered 2022-08-05: 10 mg via INTRAVENOUS
  Administered 2022-08-05: 20 mg via INTRAVENOUS
  Administered 2022-08-05: 30 mg via INTRAVENOUS
  Administered 2022-08-05: 10 mg via INTRAVENOUS

## 2022-08-05 MED ORDER — KETOROLAC TROMETHAMINE 30 MG/ML IJ SOLN
INTRAMUSCULAR | Status: DC | PRN
  Administered 2022-08-05: 15 mg via INTRAVENOUS

## 2022-08-05 MED ORDER — ACETAMINOPHEN 500 MG PO TABS
ORAL_TABLET | ORAL | Status: AC
  Filled 2022-08-05: qty 2

## 2022-08-05 MED ORDER — PROPOFOL 10 MG/ML IV BOLUS
INTRAVENOUS | Status: AC
  Filled 2022-08-05: qty 40

## 2022-08-05 MED ORDER — BUPIVACAINE HCL (PF) 0.5 % IJ SOLN
INTRAMUSCULAR | Status: AC
  Filled 2022-08-05: qty 30

## 2022-08-05 MED ORDER — DROPERIDOL 2.5 MG/ML IJ SOLN
0.6250 mg | Freq: Once | INTRAMUSCULAR | Status: AC
  Administered 2022-08-05: 0.625 mg via INTRAVENOUS
  Filled 2022-08-05: qty 2

## 2022-08-05 MED ORDER — BUPIVACAINE LIPOSOME 1.3 % IJ SUSP
INTRAMUSCULAR | Status: AC
  Filled 2022-08-05: qty 20

## 2022-08-05 MED ORDER — FENTANYL CITRATE (PF) 100 MCG/2ML IJ SOLN
INTRAMUSCULAR | Status: AC
  Filled 2022-08-05: qty 2

## 2022-08-05 MED ORDER — ONDANSETRON HCL 4 MG/2ML IJ SOLN
4.0000 mg | Freq: Once | INTRAMUSCULAR | Status: AC
  Administered 2022-08-05: 4 mg via INTRAVENOUS

## 2022-08-05 MED ORDER — HYDROMORPHONE HCL 1 MG/ML IJ SOLN
INTRAMUSCULAR | Status: DC | PRN
  Administered 2022-08-05 (×2): .5 mg via INTRAVENOUS

## 2022-08-05 MED ORDER — CHLORHEXIDINE GLUCONATE 0.12 % MT SOLN
OROMUCOSAL | Status: AC
  Filled 2022-08-05: qty 15

## 2022-08-05 MED ORDER — HYDROMORPHONE HCL 1 MG/ML IJ SOLN
INTRAMUSCULAR | Status: AC
  Filled 2022-08-05: qty 1

## 2022-08-05 MED ORDER — OXYCODONE HCL 5 MG PO TABS
5.0000 mg | ORAL_TABLET | ORAL | 0 refills | Status: DC | PRN
Start: 2022-08-05 — End: 2022-09-09

## 2022-08-05 MED ORDER — OXYCODONE HCL 5 MG/5ML PO SOLN: 5.0000 mg | Freq: Once | ORAL | Status: DC | PRN

## 2022-08-05 MED ORDER — SODIUM CHLORIDE (PF) 0.9 % IJ SOLN
INTRAMUSCULAR | Status: AC
  Filled 2022-08-05: qty 50

## 2022-08-05 MED ORDER — DEXMEDETOMIDINE HCL IN NACL 200 MCG/50ML IV SOLN
INTRAVENOUS | Status: DC | PRN
  Administered 2022-08-05 (×6): 4 ug via INTRAVENOUS

## 2022-08-05 MED ORDER — OXYCODONE HCL 5 MG PO TABS: 5.0000 mg | ORAL_TABLET | Freq: Once | ORAL | Status: DC | PRN

## 2022-08-05 MED ORDER — 0.9 % SODIUM CHLORIDE (POUR BTL) OPTIME
TOPICAL | Status: DC | PRN
  Administered 2022-08-05: 1000 mL

## 2022-08-05 MED ORDER — ONDANSETRON HCL 4 MG PO TABS: 4.0000 mg | ORAL_TABLET | Freq: Every day | ORAL | 1 refills | Status: AC | PRN

## 2022-08-05 MED ORDER — DEXAMETHASONE SODIUM PHOSPHATE 10 MG/ML IJ SOLN
INTRAMUSCULAR | Status: AC
  Filled 2022-08-05: qty 1

## 2022-08-05 MED ORDER — MIDAZOLAM HCL 2 MG/2ML IJ SOLN
INTRAMUSCULAR | Status: DC | PRN
  Administered 2022-08-05: 1 mg via INTRAVENOUS

## 2022-08-05 MED ORDER — DROPERIDOL 2.5 MG/ML IJ SOLN
INTRAMUSCULAR | Status: AC
  Filled 2022-08-05: qty 2

## 2022-08-05 MED ORDER — FENTANYL CITRATE (PF) 100 MCG/2ML IJ SOLN
INTRAMUSCULAR | Status: DC | PRN
  Administered 2022-08-05: 50 ug via INTRAVENOUS
  Administered 2022-08-05 (×2): 25 ug via INTRAVENOUS

## 2022-08-05 SURGICAL SUPPLY — 77 items
ADH SKN CLS APL DERMABOND .7 (GAUZE/BANDAGES/DRESSINGS) ×2
BLADE SURG SZ11 CARB STEEL (BLADE) ×1 IMPLANT
BULB RESERV EVAC DRAIN JP 100C (MISCELLANEOUS) IMPLANT
CANNULA REDUC XI 12-8 STAPL (CANNULA) ×1
CANNULA REDUCER 12-8 DVNC XI (CANNULA) ×1 IMPLANT
COVER TIP SHEARS 8 DVNC (MISCELLANEOUS) ×1 IMPLANT
COVER TIP SHEARS 8MM DA VINCI (MISCELLANEOUS) ×1
COVER WAND RF STERILE (DRAPES) ×1 IMPLANT
DERMABOND ADVANCED .7 DNX12 (GAUZE/BANDAGES/DRESSINGS) ×1 IMPLANT
DRAIN CHANNEL JP 15F RND 16 (MISCELLANEOUS) IMPLANT
DRAPE 3/4 80X56 (DRAPES) ×2 IMPLANT
DRAPE ARM DVNC X/XI (DISPOSABLE) ×3 IMPLANT
DRAPE COLUMN DVNC XI (DISPOSABLE) ×1 IMPLANT
DRAPE DA VINCI XI ARM (DISPOSABLE) ×3
DRAPE DA VINCI XI COLUMN (DISPOSABLE) ×1
DRAPE LAPAROTOMY 100X77 ABD (DRAPES) ×1 IMPLANT
DRSG TEGADERM 4X4.75 (GAUZE/BANDAGES/DRESSINGS) IMPLANT
ELECT CAUTERY BLADE TIP 2.5 (TIP) ×1
ELECT REM PT RETURN 9FT ADLT (ELECTROSURGICAL) ×2
ELECTRODE CAUTERY BLDE TIP 2.5 (TIP) ×1 IMPLANT
ELECTRODE REM PT RTRN 9FT ADLT (ELECTROSURGICAL) ×1 IMPLANT
FORCEPS BPLR R/ABLATION 8 DVNC (INSTRUMENTS) ×1 IMPLANT
GAUZE SPONGE 4X4 12PLY STRL (GAUZE/BANDAGES/DRESSINGS) IMPLANT
GLOVE SURG SYN 7.0 (GLOVE) ×2 IMPLANT
GLOVE SURG SYN 7.0 PF PI (GLOVE) ×2 IMPLANT
GLOVE SURG SYN 7.5  E (GLOVE) ×2
GLOVE SURG SYN 7.5 E (GLOVE) ×2 IMPLANT
GLOVE SURG SYN 7.5 PF PI (GLOVE) ×2 IMPLANT
GOWN STRL REUS W/ TWL LRG LVL3 (GOWN DISPOSABLE) ×3 IMPLANT
GOWN STRL REUS W/TWL LRG LVL3 (GOWN DISPOSABLE) ×3
GRASPER SUT TROCAR 14GX15 (MISCELLANEOUS) ×1 IMPLANT
IV NS 1000ML (IV SOLUTION)
IV NS 1000ML BAXH (IV SOLUTION) IMPLANT
KIT PINK PAD W/HEAD ARE REST (MISCELLANEOUS) ×1
KIT PINK PAD W/HEAD ARM REST (MISCELLANEOUS) ×1 IMPLANT
KIT TURNOVER KIT A (KITS) ×1 IMPLANT
LABEL OR SOLS (LABEL) ×1 IMPLANT
MANIFOLD NEPTUNE II (INSTRUMENTS) ×1 IMPLANT
MESH VENTRALIGHT ST 4.5 ECHO (Mesh General) IMPLANT
NDL DRIVE SUT CUT DVNC (INSTRUMENTS) ×1 IMPLANT
NDL HYPO 22X1.5 SAFETY MO (MISCELLANEOUS) ×1 IMPLANT
NDL INSUFFLATION 14GA 120MM (NEEDLE) ×1 IMPLANT
NEEDLE DRIVE SUT CUT DVNC (INSTRUMENTS) ×1 IMPLANT
NEEDLE HYPO 22X1.5 SAFETY MO (MISCELLANEOUS) ×1 IMPLANT
NEEDLE INSUFFLATION 14GA 120MM (NEEDLE) ×1 IMPLANT
NS IRRIG 1000ML POUR BTL (IV SOLUTION) ×1 IMPLANT
OBTURATOR OPTICAL STANDARD 8MM (TROCAR) ×1
OBTURATOR OPTICAL STND 8 DVNC (TROCAR) ×1
OBTURATOR OPTICALSTD 8 DVNC (TROCAR) ×1 IMPLANT
PACK LAP CHOLECYSTECTOMY (MISCELLANEOUS) ×1 IMPLANT
PENCIL SMOKE EVACUATOR (MISCELLANEOUS) ×1 IMPLANT
SCISSORS MNPLR CVD DVNC XI (INSTRUMENTS) ×1 IMPLANT
SCISSORS XI MNPLR CVD DVNC (INSTRUMENTS) ×1
SEAL UNIV 5-12 XI (MISCELLANEOUS) ×2 IMPLANT
SEAL XI UNIVERSAL 5-12 (MISCELLANEOUS) ×2
SET TUBE SMOKE EVAC HIGH FLOW (TUBING) ×1 IMPLANT
SOL ELECTROSURG ANTI STICK (MISCELLANEOUS) ×1
SOLUTION ELECTROSURG ANTI STCK (MISCELLANEOUS) ×1 IMPLANT
SPONGE T-LAP 18X18 ~~LOC~~+RFID (SPONGE) ×1 IMPLANT
SUT ETHILON 3-0 FS-10 30 BLK (SUTURE) ×1
SUT MNCRL 4-0 (SUTURE) ×2
SUT MNCRL 4-0 27XMFL (SUTURE) ×2
SUT STRATAFIX PDS 30 CT-1 (SUTURE) ×1 IMPLANT
SUT VIC AB 0 SH 27 (SUTURE) ×2 IMPLANT
SUT VIC AB 3-0 SH 27 (SUTURE) ×2
SUT VIC AB 3-0 SH 27X BRD (SUTURE) ×2 IMPLANT
SUT VICRYL 0 UR6 27IN ABS (SUTURE) ×2 IMPLANT
SUT VLOC 90 2/L VL 12 GS22 (SUTURE) ×2 IMPLANT
SUTURE EHLN 3-0 FS-10 30 BLK (SUTURE) IMPLANT
SUTURE MNCRL 4-0 27XMF (SUTURE) ×1 IMPLANT
SYR 30ML LL (SYRINGE) ×1 IMPLANT
SYR BULB IRRIG 60ML STRL (SYRINGE) IMPLANT
SYS BAG RETRIEVAL 10MM (BASKET) ×1
SYSTEM BAG RETRIEVAL 10MM (BASKET) IMPLANT
TAPE TRANSPORE STRL 2 31045 (GAUZE/BANDAGES/DRESSINGS) ×1 IMPLANT
TRAP FLUID SMOKE EVACUATOR (MISCELLANEOUS) ×1 IMPLANT
WATER STERILE IRR 500ML POUR (IV SOLUTION) ×1 IMPLANT

## 2022-08-05 NOTE — Anesthesia Postprocedure Evaluation (Signed)
Anesthesia Post Note  Patient: [name]  Procedure(s) Performed: XI ROBOT ASSISTED UMBILICAL HERNIA REPAIR CYST REMOVAL, excision of back cysts x 2  Patient location during evaluation: PACU Anesthesia Type: General Level of consciousness: awake and alert, oriented and patient cooperative Pain management: pain level controlled Vital Signs Assessment: post-procedure vital signs reviewed and stable Respiratory status: spontaneous breathing, nonlabored ventilation and respiratory function stable Cardiovascular status: blood pressure returned to baseline and stable Postop Assessment: adequate PO intake Anesthetic complications: no   No notable events documented.   Last Vitals:  Vitals:   08/05/22 1003 08/05/22 1545  BP: 136/81 (!) 151/68  Pulse: 64 (!) 54  Resp: 16 14  Temp: 37.2 C 36.6 C  SpO2: 94% 98%    Last Pain:  Vitals:   08/05/22 1545  TempSrc:   PainSc: 0-No pain                 Reed Breech

## 2022-08-05 NOTE — Interval H&P Note (Signed)
History and Physical Interval Note:  08/05/2022 11:20 AM  [name]  has presented today for surgery, with the diagnosis of umbilical hernia, reducible 3.5 cm  sebaceous cyst of back 8 cm 1.5 cm.  The various methods of treatment have been discussed with the patient and family. After consideration of risks, benefits and other options for treatment, the patient has consented to  Procedure(s): XI ROBOT ASSISTED UMBILICAL HERNIA REPAIR (N/A) CYST REMOVAL, excision of back cysts x 2 (N/A) as a surgical intervention.  The patient's history has been reviewed, patient examined, no change in status, stable for surgery.  I have reviewed the patient's chart and labs.  Questions were answered to the patient's satisfaction.     [name]

## 2022-08-05 NOTE — Discharge Instructions (Addendum)
Discharge Instructions: 1.  Patient may shower, but do not scrub wounds heavily and dab dry only. 2.  Do not submerge wounds in pool/tub until fully healed. 3.  Do not apply ointments or hydrogen peroxide to the wounds. 4.  May apply ice packs to the wounds for comfort. 5.  Please empty and record drain output twice daily or as needed to keep the bulb only half full at most.  Bring drain record with you to office visit. 6.  May resume your Coumadin on 08/07/22. 7.  Do not drive while taking narcotics for pain control.  Prior to driving, make sure you are able to rotate right and left to look at blindspots without significant pain or discomfort. 8.  No heavy lifting or pushing of more than 10-15 lbs for 6 weeks. 9.  Please change drain dressing once daily with gauze and tape.   AMBULATORY SURGERY  DISCHARGE INSTRUCTIONS   The drugs that you were given will stay in your system until tomorrow so for the next 24 hours you should not:  Drive an automobile Make any legal decisions Drink any alcoholic beverage   You may resume regular meals tomorrow.  Today it is better to start with liquids and gradually work up to solid foods.  You may eat anything you prefer, but it is better to start with liquids, then soup and crackers, and gradually work up to solid foods.   Please notify your doctor immediately if you have any unusual bleeding, trouble breathing, redness and pain at the surgery site, drainage, fever, or pain not relieved by medication.    Additional Instructions:  leave green armband on for 4 days        Please contact your physician with any problems or Same Day Surgery at [phone], Monday through Friday 6 am to 4 pm, or [place] at [facility] number at [phone].

## 2022-08-05 NOTE — Anesthesia Procedure Notes (Signed)
Procedure Name: Intubation Date/Time: 08/05/2022 11:55 AM  Performed by: Merlene Pulling, CRNAPre-anesthesia Checklist: Patient identified, Patient being monitored, Timeout performed, Emergency Drugs available and Suction available Patient Re-evaluated:Patient Re-evaluated prior to induction Oxygen Delivery Method: Circle system utilized Preoxygenation: Pre-oxygenation with 100% oxygen Induction Type: IV induction Ventilation: Mask ventilation without difficulty Laryngoscope Size: 4 and McGraph Grade View: Grade I Tube type: Oral Tube size: 7.0 mm Number of attempts: 1 Airway Equipment and Method: Stylet Placement Confirmation: ETT inserted through vocal cords under direct vision, positive ETCO2 and breath sounds checked- equal and bilateral Secured at: 21 cm Tube secured with: Tape Dental Injury: Teeth and Oropharynx as per pre-operative assessment

## 2022-08-05 NOTE — Transfer of Care (Signed)
Immediate Anesthesia Transfer of Care Note  Patient: [name]  Procedure(s) Performed: XI ROBOT ASSISTED UMBILICAL HERNIA REPAIR CYST REMOVAL, excision of back cysts x 2  Patient Location: PACU  Anesthesia Type:General  Level of Consciousness: awake  Airway & Oxygen Therapy: Patient Spontanous Breathing and Patient connected to face mask oxygen  Post-op Assessment: Report given to RN and Post -op Vital signs reviewed and stable  Post vital signs: Reviewed  Last Vitals:  Vitals Value Taken Time  BP 151/68 08/05/22 1545  Temp 73F   Pulse 56 08/05/22 1546  Resp 13 08/05/22 1546  SpO2 100 % 08/05/22 1546  Vitals shown include unvalidated device data.  Last Pain:  Vitals:   08/05/22 1003  TempSrc: Temporal  PainSc: 0-No pain         Complications: No notable events documented.

## 2022-08-05 NOTE — Anesthesia Preprocedure Evaluation (Signed)
Anesthesia Evaluation  Patient identified by MRN, date of birth, ID band Patient awake    Reviewed: Allergy & Precautions, NPO status , Patient's Chart, lab work & pertinent test results  Airway Mallampati: III  TM Distance: >3 FB Neck ROM: full    Dental  (+) Dental Advidsory Given, Edentulous Lower, Edentulous Upper   Pulmonary shortness of breath and with exertion, sleep apnea and Oxygen sleep apnea , COPD,  COPD inhaler, former smoker   Pulmonary exam normal        Cardiovascular hypertension, (-) angina + CAD and +CHF  + dysrhythmias Atrial Fibrillation      Neuro/Psych  PSYCHIATRIC DISORDERS      negative neurological ROS     GI/Hepatic Neg liver ROS,GERD  Medicated,,  Endo/Other  negative endocrine ROSdiabetes    Renal/GU Renal disease     Musculoskeletal   Abdominal   Peds  Hematology negative hematology ROS (+)   Anesthesia Other Findings Past Medical History: 12/28/2021: Adrenal nodule     Comment:  a.) CT abd/pel 12/28/2021: LEFT adrenal nodule measuring              1.6 cm No date: Anemia No date: Angina No date: Anxiety No date: Aortic atherosclerosis No date: Arthritis 2013: Atrial fibrillation and flutter     Comment:  a.) CHA2DS2VASc = 5 (age, CHF, HTN, vascular disease               history, T2DM);  b.) s/p DCCV (100 J) 10/27/2010; c.) s/p              ablation 09/21/2011; d.) rate/rhythm maintained on oral               amiodarone + carvedilol; chronically anticoagulated with               warfarin 07/15/2010: CAD (coronary artery disease)     Comment:  a.) LHC 07/15/2010: 30% pLAD, 40% D1, 25%/30% pLCx, 30%               mLCx - med mgmt 2013: CHF (congestive heart failure) - NYHA Class II/III     Comment:  a.) TTE 02/26/2013: EF 50%, mild MR/TR; b.) EST               06/13/2017: EF 50%, mild LCH, triv MR/TR; c.) TTTE               03/24/2020: EF >55%, mild LVH, mild MAC, mild BAE, triv                PR, mild MR/TR, G1DD; d.) TTE 11/19/2021: EF 60-65%, mod               reduced RVSF, mild LAE, mod MAC, Ao root 37 mm, G2DD No date: COPD (chronic obstructive pulmonary disease) No date: Diabetes type 2, uncontrolled     Comment:  declines DSME 2013: Dilated cardiomyopathy     Comment:  a.) EF 25% at time of Dx in 2013; b.) TTE 02/26/2013: EF              50%; c.) EST 06/13/2017: EF 50%;  d.) TTE 03/24/2020: EF               >55%; e.) TTE 11/19/2021: EF 60-65% No date: Ex-smoker No date: GERD (gastroesophageal reflux disease) No date: Hepatic steatosis No date: Hyperlipidemia No date: Hypertension 08/11/2016: Infrarenal abdominal aortic aneurysm (AAA) without rupture     Comment:  a.) CT abd/pel 08/11/2016:  3.3 cm; b.) CT abd/pel               12/22/2018: 3.3 cm; c.) CT abd/pel 08/31/2021: 3.7 cm;               d.) CT abd/pel 12/28/2021: 3.9 cm 2023: Long term current use of amiodarone     Comment:  a.) previously on dranaderone (cost prohibitive);               switched to amiodarone 2023 No date: Long term current use of anticoagulant     Comment:  a.) warfarin No date: Marijuana use     Comment:  a.) UDS (+) of THC x 3 (02/06/2013, 06/29/2013,               09/03/2013); PCP advised "one more and we will stop               prescribing ativan (08/2013)" No date: Migraine No date: Narcolepsy No date: Obesity No date: OSA (obstructive sleep apnea)     Comment:  a.) unable to tolerate nocturnal PAP therapy; uses               supplemetal oxygen at 2L/[place] during sleep No date: Seasonal allergies No date: Umbilical hernia  Past Surgical History: 09/21/2011: ATRIAL FLUTTER ABLATION; N/A     Comment:  Procedure: ATRIAL FLUTTER ABLATION;  Surgeon: Hillis Range, MD;  Location: [facility] CATH [facility];  Service:               Cardiovascular;  Laterality: N/A; 2013: CARDIAC ELECTROPHYSIOLOGY MAPPING AND ABLATION     Comment:  for atrial flutter 03/2012: CARDIOVASCULAR  STRESS TEST     Comment:  ETT WNL Gwen Pounds) 08/12/2021: CARDIOVERSION; N/A     Comment:  Procedure: CARDIOVERSION;  Surgeon: Lamar Blinks,               MD;  Location: [facility] ORS;  Service: Cardiovascular;                Laterality: N/A; 05/22/2020: COLONOSCOPY WITH PROPOFOL; N/A     Comment:  TA, diverticulosis, descending colon ulcer biopsy WNL,               int hem Servando Snare, Darren, MD) 04/28/2022: COLONOSCOPY WITH PROPOFOL; N/A     Comment:  multiple TAs - 10 small, 2 1.2cm, 2 88mm, rpt 3 yrs               (Vanga, Loel Dubonnet, MD) 05/22/2020: ESOPHAGOGASTRODUODENOSCOPY (EGD) WITH PROPOFOL; N/A     Comment:  WNL Servando Snare) 04/28/2022: ESOPHAGOGASTRODUODENOSCOPY (EGD) WITH PROPOFOL; N/A     Comment:  70mm duodenal TA, reactive gastropathy, enteric mucosa               in stomach biopsy of unclear significance Allegra Lai, Loel Dubonnet, MD) 04/28/2022: [employer] CAPSULE STUDY     Comment:  Procedure: GIVENS CAPSULE STUDY;  Surgeon: Toney Reil, MD;  Location: [facility] ENDOSCOPY;  Service:               Endoscopy;; 2014: US ECHOCARDIOGRAPHY     Comment:  EF 50%, nl LV fxn, RV nl size/function, mild mitral  insuff     Reproductive/Obstetrics negative OB ROS                             Anesthesia Physical Anesthesia Plan  ASA: 3  Anesthesia Plan: General ETT   Post-op Pain Management:    Induction: Intravenous  PONV Risk Score and Plan: Ondansetron, Dexamethasone and Midazolam  Airway Management Planned: Oral ETT  Additional Equipment:   Intra-op Plan:   Post-operative Plan: Extubation in OR  Informed Consent: I have reviewed the patients History and Physical, chart, labs and discussed the procedure including the risks, benefits and alternatives for the proposed anesthesia with the patient or authorized representative who has indicated his/her understanding and acceptance.     Dental Advisory Given  Plan  Discussed with: Anesthesiologist, CRNA and Surgeon  Anesthesia Plan Comments: (Patient consented for risks of anesthesia including but not limited to:  - adverse reactions to medications - damage to eyes, teeth, lips or other oral mucosa - nerve damage due to positioning  - sore throat or hoarseness - Damage to heart, brain, nerves, lungs, other parts of body or loss of life  Patient voiced understanding.)       Anesthesia Quick Evaluation

## 2022-08-05 NOTE — Progress Notes (Signed)
Extensive education done with mariah, brenda, and patient related drain emptying/dressing changes as well as when to start coumadin back and to make sure to call for post op appointment tomorrow morning.  Also made sure to inform them pt should wear his oxygen he has at home when he gets home and tonight while sleeping.  They verbalized understanding of all directions.

## 2022-08-05 NOTE — Op Note (Signed)
Procedure Date:  08/05/2022  Pre-operative Diagnosis:  Bilateral lower back sebaceous cysts, reducible umbilical hernia  Post-operative Diagnosis: Left lower back cyst (8 cm), right lower back cyst (1.5 cm), reducible umbilical hernia 3.2 cm.  Procedure:   Excision of bilateral lower back sebaceous cysts Robotic assisted Umbilical Hernia Repair with mesh  Surgeon:  Howie Ill, MD  Anesthesia:  General endotracheal  Estimated Blood Loss:  30 ml  Specimens:   Left lower back cyst Right lower back cyst  Complications:  None  Indications for Procedure:  This is a 67 y.o. male who presents with bilateral lower back cysts and an umbilical hernia, all of which are symptomatic.  The options of surgery versus observation were reviewed with the patient and/or family. The risks of bleeding, abscess or infection, recurrence of symptoms, potential for an open procedure, injury to surrounding structures, and chronic pain were all discussed with the patient and was willing to proceed.  Description of Procedure: The patient was correctly identified in the preoperative area and brought into the operating room.  The patient was placed supine with VTE prophylaxis in place.  Appropriate time-outs were performed.  Anesthesia was induced and the patient was intubated.  Appropriate antibiotics were infused.  The patient was then placed in prone position.  The patient's lower back was prepped and draped in usual sterile fashion.  We started with the left cyst which was the most symptomatic.  A 9 cm incision was made over the cyst, and cautery was used to dissect down dermis and subcutaneous tissue to the cyst wall.  The skin was very thin and while using cautery, there were a few small entry points into the cyst wall.  Then, skin flaps were created using cautery as well, and then the cyst was excised using cautery.  It was sent off to pathology.  The cavity was then irrigated and hemostasis was assured  with cautery.  20 ml of Exparel mixed with Bupivacaine and saline was infused intradermally.   A 15 Fr. Blake drain was inserted via lateral approach.  The skin flaps were then approximated to the wound bed using multiple 0 Vicryl sutures in order to decrease the amount of dead space in the wound.  The wound edges were then closed in two layers using 3-0 Vicryl and 4-0 Monocryl.  The incision was cleaned and sealed with DermaBond.  The drain was secured using 3-0 Nylon and dressed with 4x4 gauze and TegaDerm.  We then moved to the right lower back cyst.  Similarly, a 3 cm elliptical incision was made over the cyst using cautery and cautery was used to dissect down the subcutaneous tissue to the cyst itself.  Skin flaps were created and the cyst was excised and sent to pathology.  The cavity was irrigated.  10 ml of Exparel mixed with Bupivacaine and saline was infused intradermally.  The wound was then closed in layers using 3-0 Vicryl and 4-0 Monocryl.  The incision was cleaned and sealed with DermaBond.  All drapes were then removed and the patient was turned to supine position, with careful attention not to pull the drain.  The abdomen was then prepped and draped in a sterile fashion. The patient's hernia defect was marked with a marking pen.  A Veress needle was introduced in the left upper quadrant and pneumoperitoneum was obtained with appropriate pressures.  Using Optiview technique, an 8 mm port was introduced in the left lateral abdominal wall without complications.  Then, a 12  mm port was introduced in the left upper quadrant and an 8 mm port in the left lower quadrant under direct visualization.  The DaVinci platform was docked, camera targeted, and instruments placed under direct visualization.  The patient had omentum that was bulging through the hernia defect.  The patient's hernia was fully reduced and the peritoneum and preperitoneal fat were dissected to allow better exposure of the hernia  defect and for better mesh placement.  The defect measured 3.2 cm.  A 4.5 inch Bard Ventralight ST Echo mesh, a 0 Stratafix suture, and two 2-0 V-loc sutures were inserted through the 12 mm port under direct visualization.  The hernia defect was closed using the stratafix suture.  A PMI was brought through the center of the hernia defect and the positioning system of the mesh was passed through.  This allowed the mesh to splay open and be centered over the repair site with good overlap.  The mesh was then sutured in place circumferentially and through the center of the mesh using the V-loc sutures.  All needles and the positioning system were then removed through the 12 mm port without complications.  The preperitoneal fat was placed in an Endocatch bag.  The DaVinci platform was then undocked and instruments removed.    70 ml of Exparel solution mixed with 0.5% bupivacaine with saline was infiltrated around the mesh edges, hernia repair site, and port sites.  The 12 mm port was removed and Endocatch bag retrieved.  The fascia was closed under direct visualization utilizing an Endo Close technique with 0 Vicryl suture.  The 8 mm ports were removed. The 12 mm incision was closed using 3-0 Vicryl and 4-0 Monocryl, and the other port incisions were closed with 4-0 Monocryl.  The wounds were cleaned and sealed with DermaBond.  The patient was emerged from anesthesia and extubated and brought to the recovery room for further management.  The patient tolerated the procedure well and all counts were correct at the end of the case.   Howie Ill, MD

## 2022-08-05 NOTE — Progress Notes (Signed)
Patient is doing well, but oxygen levels decrease when sleeping, patient wears 02 at home at baseline. Per Dr. Ronni Rumble patient is okay to go home, just wearing 02 as soon as he gets home or when he is sleepy for the next 24 hours. Patient is 87% on room air at times, per Dr. Ronni Rumble is okay. Patient did receive duoneb in pacu.

## 2022-08-06 ENCOUNTER — Encounter: Payer: Self-pay | Admitting: Surgery

## 2022-08-09 ENCOUNTER — Telehealth: Payer: Self-pay

## 2022-08-09 ENCOUNTER — Telehealth: Payer: Self-pay | Admitting: Family Medicine

## 2022-08-09 ENCOUNTER — Other Ambulatory Visit: Payer: Self-pay

## 2022-08-09 DIAGNOSIS — L723 Sebaceous cyst: Secondary | ICD-10-CM

## 2022-08-09 DIAGNOSIS — K429 Umbilical hernia without obstruction or gangrene: Secondary | ICD-10-CM

## 2022-08-09 LAB — SURGICAL PATHOLOGY

## 2022-08-09 NOTE — Telephone Encounter (Signed)
After speaking to Dr. Sharen Hones patient was advised that he need to reach out to his surgeon. Patient was advised that the surgeon would need to give nursing instructions as to the care of the drain to the home health  nurse. Patient stated that he will contact his surgeon for referral for home health.

## 2022-08-09 NOTE — Telephone Encounter (Signed)
Pt called stating he has a Drain on his back from a recent surgery that needs changing everyday. Pt stated he had a friend helping him with the drain but due to their work schedule, they cannot help anymore. Pt stated a nurse from [facility] use to visit him before the surgery but hasn't visited him since the surgery. Pt asked if Dr. Reece Agar could order a [facility] nurse to restart care for him? Call back # [phone]

## 2022-08-09 NOTE — Telephone Encounter (Signed)
Patient called and was asking for home care to help with his JP drain in his back. He is unable to change the dressing or empty the bulb. He currently is unable to drive to come to the office for [place] to do this for him and he does live alone and does not have any help. Referral placed to [facility] home health for drain care and help with dressings.

## 2022-08-10 ENCOUNTER — Encounter: Payer: Self-pay | Admitting: Family Medicine

## 2022-08-10 DIAGNOSIS — Z7984 Long term (current) use of oral hypoglycemic drugs: Secondary | ICD-10-CM | POA: Diagnosis not present

## 2022-08-10 DIAGNOSIS — I480 Paroxysmal atrial fibrillation: Secondary | ICD-10-CM | POA: Diagnosis not present

## 2022-08-10 DIAGNOSIS — I5022 Chronic systolic (congestive) heart failure: Secondary | ICD-10-CM | POA: Diagnosis not present

## 2022-08-10 DIAGNOSIS — I714 Abdominal aortic aneurysm, without rupture, unspecified: Secondary | ICD-10-CM | POA: Diagnosis not present

## 2022-08-10 DIAGNOSIS — E785 Hyperlipidemia, unspecified: Secondary | ICD-10-CM | POA: Diagnosis not present

## 2022-08-10 DIAGNOSIS — Z9181 History of falling: Secondary | ICD-10-CM | POA: Diagnosis not present

## 2022-08-10 DIAGNOSIS — G4733 Obstructive sleep apnea (adult) (pediatric): Secondary | ICD-10-CM | POA: Diagnosis not present

## 2022-08-10 DIAGNOSIS — Z48815 Encounter for surgical aftercare following surgery on the digestive system: Secondary | ICD-10-CM | POA: Diagnosis not present

## 2022-08-10 DIAGNOSIS — Z5289 Donor of other specified organs or tissues: Secondary | ICD-10-CM | POA: Diagnosis not present

## 2022-08-10 DIAGNOSIS — K429 Umbilical hernia without obstruction or gangrene: Secondary | ICD-10-CM | POA: Diagnosis not present

## 2022-08-10 DIAGNOSIS — I420 Dilated cardiomyopathy: Secondary | ICD-10-CM | POA: Diagnosis not present

## 2022-08-10 DIAGNOSIS — I2510 Atherosclerotic heart disease of native coronary artery without angina pectoris: Secondary | ICD-10-CM | POA: Diagnosis not present

## 2022-08-10 DIAGNOSIS — E119 Type 2 diabetes mellitus without complications: Secondary | ICD-10-CM | POA: Diagnosis not present

## 2022-08-10 DIAGNOSIS — Z7901 Long term (current) use of anticoagulants: Secondary | ICD-10-CM | POA: Diagnosis not present

## 2022-08-10 DIAGNOSIS — I4892 Unspecified atrial flutter: Secondary | ICD-10-CM | POA: Diagnosis not present

## 2022-08-11 ENCOUNTER — Telehealth: Payer: Self-pay

## 2022-08-11 NOTE — Telephone Encounter (Signed)
[name], RN HH from [facility], called to report she can check INR tomorrow and pt will not need to come in for coumadin clinic apt. Advised this is ok and to call coumadin clinic with result. [name] verbalized understanding and has number to clinic. [name]'s number is [phone]. She reports she will most likely be visiting him weekly for about 8 weeks.   Cancelled coumadin clinic apt for tomorrow.

## 2022-08-11 NOTE — Telephone Encounter (Signed)
Noted! Thank you

## 2022-08-12 ENCOUNTER — Ambulatory Visit: Payer: Medicare Other

## 2022-08-12 ENCOUNTER — Ambulatory Visit (INDEPENDENT_AMBULATORY_CARE_PROVIDER_SITE_OTHER): Payer: Medicare Other

## 2022-08-12 DIAGNOSIS — G4733 Obstructive sleep apnea (adult) (pediatric): Secondary | ICD-10-CM | POA: Diagnosis not present

## 2022-08-12 DIAGNOSIS — E119 Type 2 diabetes mellitus without complications: Secondary | ICD-10-CM | POA: Diagnosis not present

## 2022-08-12 DIAGNOSIS — Z5289 Donor of other specified organs or tissues: Secondary | ICD-10-CM | POA: Diagnosis not present

## 2022-08-12 DIAGNOSIS — L89139 Pressure ulcer of right lower back, unspecified stage: Secondary | ICD-10-CM | POA: Diagnosis not present

## 2022-08-12 DIAGNOSIS — I714 Abdominal aortic aneurysm, without rupture, unspecified: Secondary | ICD-10-CM | POA: Diagnosis not present

## 2022-08-12 DIAGNOSIS — I480 Paroxysmal atrial fibrillation: Secondary | ICD-10-CM | POA: Diagnosis not present

## 2022-08-12 DIAGNOSIS — T8131XA Disruption of external operation (surgical) wound, not elsewhere classified, initial encounter: Secondary | ICD-10-CM | POA: Diagnosis not present

## 2022-08-12 DIAGNOSIS — E785 Hyperlipidemia, unspecified: Secondary | ICD-10-CM | POA: Diagnosis not present

## 2022-08-12 DIAGNOSIS — K429 Umbilical hernia without obstruction or gangrene: Secondary | ICD-10-CM | POA: Diagnosis not present

## 2022-08-12 DIAGNOSIS — Z7984 Long term (current) use of oral hypoglycemic drugs: Secondary | ICD-10-CM | POA: Diagnosis not present

## 2022-08-12 DIAGNOSIS — I2510 Atherosclerotic heart disease of native coronary artery without angina pectoris: Secondary | ICD-10-CM | POA: Diagnosis not present

## 2022-08-12 DIAGNOSIS — Z7901 Long term (current) use of anticoagulants: Secondary | ICD-10-CM

## 2022-08-12 DIAGNOSIS — S31104A Unspecified open wound of abdominal wall, left lower quadrant without penetration into peritoneal cavity, initial encounter: Secondary | ICD-10-CM | POA: Diagnosis not present

## 2022-08-12 DIAGNOSIS — I420 Dilated cardiomyopathy: Secondary | ICD-10-CM | POA: Diagnosis not present

## 2022-08-12 DIAGNOSIS — L89149 Pressure ulcer of left lower back, unspecified stage: Secondary | ICD-10-CM | POA: Diagnosis not present

## 2022-08-12 DIAGNOSIS — Z48815 Encounter for surgical aftercare following surgery on the digestive system: Secondary | ICD-10-CM | POA: Diagnosis not present

## 2022-08-12 DIAGNOSIS — I5022 Chronic systolic (congestive) heart failure: Secondary | ICD-10-CM | POA: Diagnosis not present

## 2022-08-12 DIAGNOSIS — I4892 Unspecified atrial flutter: Secondary | ICD-10-CM | POA: Diagnosis not present

## 2022-08-12 DIAGNOSIS — Z9181 History of falling: Secondary | ICD-10-CM | POA: Diagnosis not present

## 2022-08-12 LAB — POCT INR: INR: 1.7 — AB (ref 2.0–3.0)

## 2022-08-12 NOTE — Patient Instructions (Addendum)
Pre visit review using our clinic review tool, if applicable. No additional management support is needed unless otherwise documented below in the visit note.  Increase dose today to take 1 tablet and then continue to take 1/2 tablet daily except take 1 tablet on Monday, Wednesday, and Friday. Recheck in 1 week.

## 2022-08-12 NOTE — Progress Notes (Addendum)
Pt had hernia repair surgery on 4/11 and had a 5 day warfarin hold. He restarted the day after surgery at current dose. Pt reports doing well since surgery.  Increase dose today to take 1 tablet and then continue to take 1/2 tablet daily except take 1 tablet on Monday, Wednesday, and Friday. Recheck in 1 week.  Advised both RN and pt of dosing instructions and recheck date. Both verbalized understanding.

## 2022-08-13 ENCOUNTER — Encounter: Payer: Self-pay | Admitting: Surgery

## 2022-08-13 ENCOUNTER — Ambulatory Visit: Payer: Self-pay

## 2022-08-13 ENCOUNTER — Ambulatory Visit (INDEPENDENT_AMBULATORY_CARE_PROVIDER_SITE_OTHER): Payer: Medicare Other | Admitting: Surgery

## 2022-08-13 ENCOUNTER — Other Ambulatory Visit: Payer: Self-pay | Admitting: Family

## 2022-08-13 ENCOUNTER — Telehealth: Payer: Self-pay

## 2022-08-13 VITALS — BP 119/72 | HR 69 | Temp 98.7°F | Ht 71.0 in | Wt 228.6 lb

## 2022-08-13 DIAGNOSIS — L723 Sebaceous cyst: Secondary | ICD-10-CM

## 2022-08-13 DIAGNOSIS — K429 Umbilical hernia without obstruction or gangrene: Secondary | ICD-10-CM

## 2022-08-13 DIAGNOSIS — Z139 Encounter for screening, unspecified: Secondary | ICD-10-CM

## 2022-08-13 DIAGNOSIS — Z09 Encounter for follow-up examination after completed treatment for conditions other than malignant neoplasm: Secondary | ICD-10-CM

## 2022-08-13 NOTE — Patient Instructions (Addendum)
If you have any concerns or questions, please feel free to call our office. See follow up appointment below.  Excision of Skin Lesions, Care After The following information offers guidance on how to care for yourself after your procedure. Your health care provider may also give you more specific instructions. If you have problems or questions, contact your health care provider. What can I expect after the procedure? After your procedure, it is common to have: Soreness or mild pain. Some redness and swelling. Follow these instructions at home: Excision site care  Follow instructions from your health care provider about how to take care of your excision site. Make sure you: Wash your hands with soap and water for at least 20 seconds before and after you change your bandage (dressing). If soap and water are not available, use hand sanitizer. Change your dressing as told by your health care provider. Leave stitches (sutures), skin glue, or adhesive strips in place. These skin closures may need to stay in place for 2 weeks or longer. If adhesive strip edges start to loosen and curl up, you may trim the loose edges. Do not remove adhesive strips completely unless your health care provider tells you to do that. Check the excision area every day for signs of infection. Watch for: More redness, swelling, or pain. Fluid or blood. Warmth. Pus or a bad smell. Keep the site clean, dry, and protected for at least 48 hours. For bleeding, apply gentle but firm pressure to the area using a folded towel for 20 minutes. Do not take baths, swim, or use a hot tub until your health care provider approves. Ask your health care provider if you may take showers. You may only be allowed to take sponge baths. General instructions Take over-the-counter and prescription medicines only as told by your health care provider. Follow instructions from your health care provider about how to minimize scarring. Scarring should  lessen over time. Avoid sun exposure until the area has healed. Use sunscreen to protect the area from the sun after it has healed. Avoid high-impact exercise and activities until the sutures are removed or the area heals. Keep all follow-up visits. This is important. Contact a health care provider if: You have more redness, swelling, or pain around your excision site. You have fluid or blood coming from your excision site. Your excision site feels warm to the touch. You have pus or a bad smell coming from your excision site. You have a fever. You have pain that does not improve in 2-3 days after your procedure. Get help right away if: You have bleeding that does not stop with pressure or a dressing. Your wound opens up. Summary Take over-the-counter and prescription medicines only as told by your health care provider. Change your dressing as told by your health care provider. Contact a health care provider if you have redness, swelling, pain, or other signs of infection around your excision site. Keep all follow-up visits. This is important. This information is not intended to replace advice given to you by your health care provider. Make sure you discuss any questions you have with your health care provider. Document Revised: 11/11/2020 Document Reviewed: 11/11/2020 Elsevier Patient Education  2023 [employer]  

## 2022-08-13 NOTE — Patient Outreach (Unsigned)
  Care Coordination   Initial Visit Note   08/16/2022 Name: PHELAN SCHADT MRN: [id number] DOB: [birth year]  Chrissie Noa Farabee is a 67 y.o. year old male who sees Eustaquio Boyden, MD for primary care. I spoke with  Chrissie Noa Skelton by phone today.  What matters to the patients health and wellness today?  Patient reports having recent surgery due to large hernia and 2 cysts on back.  He reports having post operative visit today and having drain removed.  Patient reports minimal pain.  He denies any signs/ symptoms of infection. Patient reports having nursing and PT home health services.    Goals Addressed             This Visit's Progress    Management of post surgical wounds and chronic health conditions.       Interventions Today    Flowsheet Row Most Recent Value  Chronic Disease   Chronic disease during today's visit Congestive Heart Failure (CHF), Other  [anemia/ status post hernia / back cyst surgery]  General Interventions   General Interventions Discussed/Reviewed General Interventions Discussed, Doctor Visits  [evaluations of current treatment plan for HF, status post hernia/ back cyst surgery and patients adherence to plan as established by provider. Assessed pain level / signs of infection. Assessed for HF symptoms and home health services.]  Doctor Visits Discussed/Reviewed Doctor Visits Discussed  [Reviewed scheduled/ upcoming provider visits. Advised to keep follow up appointment with providers. Confirmed patient had post operative follow up visit with surgeon.]  Exercise Interventions   Exercise Discussed/Reviewed Physical Activity  [encouraged to continue instructed exercises provided by home PT.]  Physical Activity Discussed/Reviewed Physical Activity Reviewed  Education Interventions   Education Provided Provided Education  [Reviewed signs/ symptoms of infection]  Provided Verbal Education On Other  [Discussed signs/ symptoms of infection., Advised surgical sites  should be clean and dry. Change dressings as surgeon recommendation.]  Pharmacy Interventions   Pharmacy Dicussed/Reviewed Pharmacy Topics Discussed  [medications reviewed and compliance discussed.]              SDOH assessments and interventions completed:  Yes  SDOH Interventions Today    Flowsheet Row Most Recent Value  SDOH Interventions   Food Insecurity Interventions Intervention Not Indicated  Housing Interventions Intervention Not Indicated  Transportation Interventions Other (Comment)  [referral to community resource guide for transportation assistance.]        Care Coordination Interventions:  Yes, provided   Follow up plan: Follow up call scheduled for 09/21/22    Encounter Outcome:  Pt. Visit Completed   George Ina RN,BSN,CCM [facility] Care Coordination [phone] direct line

## 2022-08-13 NOTE — Patient Outreach (Signed)
  Care Coordination   08/13/2022 Name: KIREN MCISAAC MRN: [id number] DOB: June 14, 1955   Care Coordination Outreach Attempts:  An unsuccessful telephone outreach was attempted for a scheduled appointment today. HIPAA compliant message left with call back phone number and return call request.  Follow Up Plan:  Additional outreach attempts will be made to offer the patient care coordination information and services.   Encounter Outcome:  No Answer   Care Coordination Interventions:  No, not indicated    George Ina [facility] Care Coordination [phone] direct line

## 2022-08-13 NOTE — Patient Instructions (Signed)
Visit Information  Thank you for taking time to visit with me today. Please don't hesitate to contact me if I can be of assistance to you.   Following are the goals we discussed today:  Monitor surgical wounds for signs/ symptoms of infection Notify surgeon for signs/ symptoms of infection or other concerns regarding recent surgery Keep follow up appointments with providers Take medications as prescribed  Our next appointment is by telephone on 09/21/22 at 3 pm  Please call the care guide team at [phone] if you need to cancel or reschedule your appointment.   If you are experiencing a Mental Health or Behavioral Health Crisis or need someone to talk to, please call the Suicide and Crisis Lifeline: 988 call 1-800-273-TALK (toll free, 24 hour hotline)  Patient verbalizes understanding of instructions and care plan provided today and agrees to view in MyChart. Active MyChart status and patient understanding of how to access instructions and care plan via MyChart confirmed with patient.     George Ina RN,BSN,CCM [facility] Care Coordination [phone] direct line

## 2022-08-13 NOTE — Progress Notes (Signed)
08/13/2022  History of Present Illness: [name] is a 67 y.o. male status post robotic assisted umbilical hernia repair and excision of 2 sebaceous cysts of the lower back on 08/05/2022.  A Blake drain was left in the left lower back from the larger of the 2 cysts.  He presents today for follow-up.  Reports that initially after surgery he did have some more significant pain and constipation but this has since improved.  Denies any nausea or vomiting.  Reports bruising at the umbilical area but otherwise has been doing well.  The drain for the left sebaceous cyst has been having very low output daily.  Past Medical History: Past Medical History:  Diagnosis Date   Adrenal nodule 12/28/2021   a.) CT abd/pel 12/28/2021: LEFT adrenal nodule measuring 1.6 cm   Anemia    Angina    Anxiety    Aortic atherosclerosis    Arthritis    Atrial fibrillation and flutter 2013   a.) CHA2DS2VASc = 5 (age, CHF, HTN, vascular disease history, T2DM);  b.) s/p DCCV (100 J) 10/27/2010; c.) s/p ablation 09/21/2011; d.) rate/rhythm maintained on oral amiodarone + carvedilol; chronically anticoagulated with warfarin   CAD (coronary artery disease) 07/15/2010   a.) LHC 07/15/2010: 30% pLAD, 40% D1, 25%/30% pLCx, 30% mLCx - med mgmt   CHF (congestive heart failure) - NYHA Class II/III 2013   a.) TTE 02/26/2013: EF 50%, mild MR/TR; b.) EST 06/13/2017: EF 50%, mild LCH, triv MR/TR; c.) TTTE 03/24/2020: EF >55%, mild LVH, mild MAC, mild BAE, triv PR, mild MR/TR, G1DD; d.) TTE 11/19/2021: EF 60-65%, mod reduced RVSF, mild LAE, mod MAC, Ao root 37 mm, G2DD   COPD (chronic obstructive pulmonary disease)    Diabetes type 2, uncontrolled    declines DSME   Dilated cardiomyopathy 2013   a.) EF 25% at time of Dx in 2013; b.) TTE 02/26/2013: EF 50%; c.) EST 06/13/2017: EF 50%;  d.) TTE 03/24/2020: EF >55%; e.) TTE 11/19/2021: EF 60-65%   Ex-smoker    GERD (gastroesophageal reflux disease)    Hepatic steatosis     Hyperlipidemia    Hypertension    Infrarenal abdominal aortic aneurysm (AAA) without rupture 08/11/2016   a.) CT abd/pel 08/11/2016: 3.3 cm; b.) CT abd/pel 12/22/2018: 3.3 cm; c.) CT abd/pel 08/31/2021: 3.7 cm; d.) CT abd/pel 12/28/2021: 3.9 cm   Long term current use of amiodarone 2023   a.) previously on dranaderone (cost prohibitive); switched to amiodarone 2023   Long term current use of anticoagulant    a.) warfarin   Marijuana use    a.) UDS (+) of THC x 3 (02/06/2013, 06/29/2013, 09/03/2013); PCP advised "one more and we will stop prescribing ativan (08/2013)"   Migraine    Narcolepsy    Obesity    OSA (obstructive sleep apnea)    a.) unable to tolerate nocturnal PAP therapy; uses supplemetal oxygen at 2L/[place] during sleep   Seasonal allergies    Umbilical hernia      Past Surgical History: Past Surgical History:  Procedure Laterality Date   ATRIAL FLUTTER ABLATION N/A 09/21/2011   Procedure: ATRIAL FLUTTER ABLATION;  Surgeon: Hillis Range, MD;  Location: [facility] CATH LAB;  Service: Cardiovascular;  Laterality: N/A;   CARDIAC ELECTROPHYSIOLOGY MAPPING AND ABLATION  2013   for atrial flutter   CARDIOVASCULAR STRESS TEST  03/2012   ETT WNL Gwen Pounds)   CARDIOVERSION N/A 08/12/2021   Procedure: CARDIOVERSION;  Surgeon: Lamar Blinks, MD;  Location: [facility] ORS;  Service: Cardiovascular;  Laterality: N/A;   COLONOSCOPY WITH PROPOFOL N/A 05/22/2020   TA, diverticulosis, descending colon ulcer biopsy WNL, int hem Servando Snare, Darren, MD)   COLONOSCOPY WITH PROPOFOL N/A 04/28/2022   multiple TAs - 10 small, 2 1.2cm, 2 9mm, rpt 3 yrs (Vanga, Loel Dubonnet, MD)   CYST EXCISION N/A 08/05/2022   excision of back cysts x 2 (pathology returned epidermal inclusion cysts);  Surgeon: Henrene Dodge, MD   ESOPHAGOGASTRODUODENOSCOPY (EGD) WITH PROPOFOL N/A 05/22/2020   WNL (Wohl)   ESOPHAGOGASTRODUODENOSCOPY (EGD) WITH PROPOFOL N/A 04/28/2022   12mm duodenal TA, reactive gastropathy, enteric mucosa in  stomach biopsy of unclear significance (Vanga, Loel Dubonnet, MD)   GIVENS CAPSULE STUDY  04/28/2022   Procedure: GIVENS CAPSULE STUDY;  Surgeon: Toney Reil, MD;  Location: [facility] ENDOSCOPY;  Service: Endoscopy;;   US ECHOCARDIOGRAPHY  2014   EF 50%, nl LV fxn, RV nl size/function, mild mitral insuff    Home Medications: Prior to Admission medications   Medication Sig Start Date End Date Taking? Authorizing Provider  ACCU-CHEK FASTCLIX LANCETS MISC Check blood sugar once daily and as instructed. Dx 250.00 05/10/13  Yes Eustaquio Boyden, MD  acetaminophen (TYLENOL) 500 MG tablet Take 2 tablets (1,000 mg total) by mouth every 6 (six) hours as needed for mild pain. 08/05/22  Yes Nattalie Santiesteban, Elita Quick, MD  albuterol (VENTOLIN HFA) 108 (90 Base) MCG/ACT inhaler Inhale 2 puffs into the lungs every 6 (six) hours as needed for wheezing or shortness of breath. 01/12/22  Yes Eustaquio Boyden, MD  amiodarone (PACERONE) 200 MG tablet Take 1 tablet (200 mg total) by mouth daily. Patient taking differently: Take 200 mg by mouth every morning. 09/16/21  Yes Lanier Prude, MD  Blood Glucose Monitoring Suppl (BLOOD GLUCOSE MONITOR SYSTEM) W/DEVICE KIT by Does not apply route. Use to check sugar once daily and as needed Dx: E11.9 **ONE TOUCH VERIO**   Yes [provider]  Budeson-Glycopyrrol-Formoterol (BREZTRI AEROSPHERE) 160-9-4.8 MCG/ACT AERO Inhale 2 puffs into the lungs 2 (two) times daily. 10/23/21  Yes Eustaquio Boyden, MD  carvedilol (COREG) 6.25 MG tablet TAKE 1 TABLET BY MOUTH TWICE A DAY WITH FOOD 07/28/22  Yes Eustaquio Boyden, MD  Cholecalciferol (VITAMIN D) 50 MCG (2000 UT) CAPS Take 1 capsule (2,000 Units total) by mouth daily. 02/05/19  Yes Eustaquio Boyden, MD  dapagliflozin propanediol (FARXIGA) 10 MG TABS tablet Take 1 tablet (10 mg total) by mouth daily before breakfast. 03/01/22  Yes Delma Freeze, FNP  Docusate Calcium (STOOL SOFTENER PO) Take 1 tablet by mouth daily.   Yes  [provider]  Fe Fum-Fe Poly-Vit C-Lactobac (FUSION PO) Take 1 tablet by mouth every other day. Patient states received sample.  Unsure of mg.   Yes [provider]  FLUoxetine (PROZAC) 40 MG capsule Take 1 capsule (40 mg total) by mouth daily. Patient taking differently: Take 40 mg by mouth every morning. 02/19/22  Yes Eustaquio Boyden, MD  fluticasone [facility]) 50 MCG/ACT nasal spray Place 2 sprays into both nostrils daily. Patient taking differently: Place 2 sprays into both nostrils as needed. 01/12/22  Yes Eustaquio Boyden, MD  furosemide (LASIX) 80 MG tablet TAKE 1 TABLET BY MOUTH 2 TIMES DAILY. 05/26/22  Yes Eustaquio Boyden, MD  glimepiride (AMARYL) 4 MG tablet Take 1 tablet (4 mg total) by mouth daily with breakfast. 01/14/22  Yes Eustaquio Boyden, MD  glucose blood [facility] VERIO) test strip Check blood sugar 3 times a day 10/22/21  Yes Eustaquio Boyden, MD  ipratropium-albuterol (DUONEB) 0.5-2.5 (3) MG/3ML SOLN TAKE 3 MLS BY NEBULIZATION EVERY 6 (SIX) HOURS AS NEEDED (SEVERE SHORTNESS OF BREATH/WHEEZING). 05/31/22  Yes Eustaquio Boyden, MD  metFORMIN (GLUCOPHAGE) 500 MG tablet Take 1 tablet (500 mg total) by mouth 2 (two) times daily with a meal. 07/28/22  Yes Eustaquio Boyden, MD  methocarbamol (ROBAXIN) 500 MG tablet Take 1 tablet (500 mg total) by mouth 3 (three) times daily as needed for muscle spasms (sedation precautions). 07/02/22  Yes Eustaquio Boyden, MD  montelukast (SINGULAIR) 10 MG tablet TAKE 1 TABLET BY MOUTH EVERYDAY AT BEDTIME Patient taking differently: Take 10 mg by mouth at bedtime. 07/06/22  Yes Eustaquio Boyden, MD  ondansetron (ZOFRAN) 4 MG tablet Take 1 tablet (4 mg total) by mouth daily as needed for nausea or vomiting. 08/05/22 08/05/23 Yes Artesia Berkey, MD  OVER THE COUNTER MEDICATION Take 1 tablet by mouth daily. Superbeets   Yes [provider]  OVER THE COUNTER MEDICATION Take 1 capsule by mouth daily at 6 (six) AM. [place] sea moss    Yes [provider]  oxyCODONE (OXY IR/ROXICODONE) 5 MG immediate release tablet Take 1 tablet (5 mg total) by mouth every 4 (four) hours as needed for severe pain. 08/05/22  Yes Ubaldo Daywalt, Elita Quick, MD  OXYGEN Inhale 2 L into the lungs at bedtime.   Yes [provider]  pantoprazole (PROTONIX) 40 MG tablet TAKE 1 TABLET (40 MG TOTAL) BY MOUTH TWICE A DAY BEFORE MEALS Patient taking differently: 40 mg 2 (two) times daily before a meal. 07/13/22  Yes Eustaquio Boyden, MD  potassium chloride (KLOR-CON M) 10 MEQ tablet Take 1 tablet (10 mEq total) by mouth daily. And an extra one on the days you take metolazone Patient taking differently: Take 10 mEq by mouth every morning. And an extra one on the days you take metolazone 02/25/22  Yes Clarisa Kindred A, FNP  Probiotic Product (PROBIOTIC DAILY PO) Take 1 tablet by mouth daily.   Yes [provider]  Saw Palmetto 450 MG CAPS Take 1 capsule (450 mg total) by mouth daily. 03/29/22  Yes Eustaquio Boyden, MD  simvastatin (ZOCOR) 20 MG tablet TAKE 1 TABLET BY MOUTH EVERY DAY IN THE EVENING Patient taking differently: Take 20 mg by mouth daily at 6 PM. TAKE 1 TABLET BY MOUTH EVERY DAY IN THE EVENING 05/18/22  Yes Eustaquio Boyden, MD  Turmeric (QC TUMERIC COMPLEX PO) Take 1 tablet by mouth daily at 6 (six) AM.   Yes [provider]  vitamin B-12 (CYANOCOBALAMIN) 1000 MCG tablet Take 1 tablet (1,000 mcg total) by mouth every Monday, Wednesday, and Friday. Patient taking differently: Take 1,000 mcg by mouth daily. 07/22/21  Yes Eustaquio Boyden, MD  warfarin (COUMADIN) 5 MG tablet TAKE 1/2 TABLET BY MOUTH DAILY EXCEPT TAKE 1 TABLET ON MONDAYS, WEDNESDAYS AND FRIDAYS OR AS DIRECTED BY ANTICOAGULATION CLINIC Patient taking differently: Take 2.5-5 mg by mouth daily. TAKE 1/2 TABLET BY MOUTH DAILY EXCEPT TAKE 1 TABLET ON MONDAYS, WEDNESDAYS AND FRIDAYS OR AS DIRECTED BY ANTICOAGULATION CLINIC 07/22/22  Yes Eustaquio Boyden, MD     Allergies: Allergies  Allergen Reactions   Atorvastatin Other (See Comments)    Dizziness   Lisinopril Cough    Review of Systems: Review of Systems  Constitutional:  Negative for chills and fever.  Respiratory:  Negative for shortness of breath.   Cardiovascular:  Negative for chest pain.  Gastrointestinal:  Positive for abdominal pain. Negative for nausea and vomiting.  Skin:  Bruising around the umbilicus.    Physical Exam BP 119/72   Pulse 69   Temp 98.7 F (37.1 C) (Oral)   Ht  (1.803 m)   Wt 228 lb 9.6 oz (103.7 kg)   SpO2 94%   BMI 31.88 kg/m  CONSTITUTIONAL: No acute distress, well-nourished HEENT:  Normocephalic, atraumatic, extraocular motion intact. RESPIRATORY:  Normal respiratory effort without pathologic use of accessory muscles. CARDIOVASCULAR: Irregular rhythm, regular rate.Marland Kitchen GI: The abdomen is soft, nondistended, appropriate tender to palpation.  Patient's robotic incisions are clean, dry, intact.  There are some ecchymosis surrounding the umbilical area with some associated edema but no evidence of infection. SKIN: Lower back incisions are healing appropriately and are clean, dry, intact.  Left-sided drain was removed today without any complications with dry gauze dressing applied. NEUROLOGIC:  Motor and sensation is grossly normal.  Cranial nerves are grossly intact. PSYCH:  Alert and oriented to person, place and time. Affect is normal.  Assessment and Plan: This is a 67 y.o. male robotic assisted umbilical hernia repair and open excision of 2 lower back cysts.  [name] drain was removed today without complications.  Discussed with patient that he is otherwise healing appropriately from his surgeries.  There is expected soreness and swelling at the umbilical area and given the size of the hernia sac.  This should be improving on its own. - Reminded the patient of activity restrictions. - Follow-up in about 3 weeks to assess his  progress.  I spent 20 minutes dedicated to the care of this patient on the date of this encounter to include pre-visit review of records, face-to-face time with the patient discussing diagnosis and management, and any post-visit coordination of care.   Howie Ill, MD [facility]

## 2022-08-15 ENCOUNTER — Other Ambulatory Visit: Payer: Self-pay | Admitting: Family Medicine

## 2022-08-15 DIAGNOSIS — J9601 Acute respiratory failure with hypoxia: Secondary | ICD-10-CM | POA: Diagnosis not present

## 2022-08-15 DIAGNOSIS — G4733 Obstructive sleep apnea (adult) (pediatric): Secondary | ICD-10-CM | POA: Diagnosis not present

## 2022-08-16 ENCOUNTER — Telehealth: Payer: Self-pay

## 2022-08-16 NOTE — Telephone Encounter (Signed)
   Telephone encounter was:  Unsuccessful.  08/16/2022 Name: [name] MRN: [id number] DOB: [birth year]  Unsuccessful outbound call made today to assist with:  Transportation Needs   Outreach Attempt:  1st Attempt  A HIPAA compliant voice message was left requesting a return call.  Instructed patient to call back at [phone].  Precious Gilchrest [facility]  [facility] Population Health Community Resource Care Guide   [email or web address]  ?? [phone]   Website: [email or web address]  [email or web address]

## 2022-08-17 ENCOUNTER — Telehealth: Payer: Self-pay

## 2022-08-17 DIAGNOSIS — Z7901 Long term (current) use of anticoagulants: Secondary | ICD-10-CM | POA: Diagnosis not present

## 2022-08-17 DIAGNOSIS — I2510 Atherosclerotic heart disease of native coronary artery without angina pectoris: Secondary | ICD-10-CM | POA: Diagnosis not present

## 2022-08-17 DIAGNOSIS — I4892 Unspecified atrial flutter: Secondary | ICD-10-CM | POA: Diagnosis not present

## 2022-08-17 DIAGNOSIS — G4733 Obstructive sleep apnea (adult) (pediatric): Secondary | ICD-10-CM | POA: Diagnosis not present

## 2022-08-17 DIAGNOSIS — I5022 Chronic systolic (congestive) heart failure: Secondary | ICD-10-CM | POA: Diagnosis not present

## 2022-08-17 DIAGNOSIS — K429 Umbilical hernia without obstruction or gangrene: Secondary | ICD-10-CM | POA: Diagnosis not present

## 2022-08-17 DIAGNOSIS — E785 Hyperlipidemia, unspecified: Secondary | ICD-10-CM | POA: Diagnosis not present

## 2022-08-17 DIAGNOSIS — Z9181 History of falling: Secondary | ICD-10-CM | POA: Diagnosis not present

## 2022-08-17 DIAGNOSIS — E119 Type 2 diabetes mellitus without complications: Secondary | ICD-10-CM | POA: Diagnosis not present

## 2022-08-17 DIAGNOSIS — Z48815 Encounter for surgical aftercare following surgery on the digestive system: Secondary | ICD-10-CM | POA: Diagnosis not present

## 2022-08-17 DIAGNOSIS — I714 Abdominal aortic aneurysm, without rupture, unspecified: Secondary | ICD-10-CM | POA: Diagnosis not present

## 2022-08-17 DIAGNOSIS — I420 Dilated cardiomyopathy: Secondary | ICD-10-CM | POA: Diagnosis not present

## 2022-08-17 DIAGNOSIS — Z7984 Long term (current) use of oral hypoglycemic drugs: Secondary | ICD-10-CM | POA: Diagnosis not present

## 2022-08-17 DIAGNOSIS — Z5289 Donor of other specified organs or tissues: Secondary | ICD-10-CM | POA: Diagnosis not present

## 2022-08-17 DIAGNOSIS — I480 Paroxysmal atrial fibrillation: Secondary | ICD-10-CM | POA: Diagnosis not present

## 2022-08-17 NOTE — Telephone Encounter (Signed)
   Telephone encounter was:  Successful.  08/17/2022 Name: GAEL LONDO MRN: [id number] DOB: 1956/02/19  Chrissie Noa Hanton is a 67 y.o. year old male who is a primary care patient of Eustaquio Boyden, MD . The community resource team was consulted for assistance with Transportation Needs   Care guide performed the following interventions: Patient left message on voicemail that he does not need have need of transportation at this time.  Follow Up Plan:  No further follow up planned at this time. The patient has been provided with needed resources.  Kairah Leoni [facility]  [facility] Population Health Community Resource Care Guide   [email or web address]  ?? [phone]   Website: [email or web address]  [email or web address]

## 2022-08-17 NOTE — Telephone Encounter (Signed)
   Telephone encounter was:  Unsuccessful.  08/17/2022 Name: [name] MRN: [id number] DOB: [birth year]  Unsuccessful outbound call made today to assist with:  Transportation Needs   Outreach Attempt:  2nd Attempt  A HIPAA compliant voice message was left requesting a return call.  Instructed patient to call back at [phone]. Left message on voicemail for patient to return my call regarding transportation assistance.  [name] [facility]  [facility] Population Health Community Resource Care Guide   [email or web address]  ?? [phone]   Website: [email or web address]  [email or web address]

## 2022-08-19 ENCOUNTER — Ambulatory Visit (INDEPENDENT_AMBULATORY_CARE_PROVIDER_SITE_OTHER): Payer: Medicare Other

## 2022-08-19 DIAGNOSIS — I714 Abdominal aortic aneurysm, without rupture, unspecified: Secondary | ICD-10-CM | POA: Diagnosis not present

## 2022-08-19 DIAGNOSIS — I480 Paroxysmal atrial fibrillation: Secondary | ICD-10-CM | POA: Diagnosis not present

## 2022-08-19 DIAGNOSIS — Z7984 Long term (current) use of oral hypoglycemic drugs: Secondary | ICD-10-CM | POA: Diagnosis not present

## 2022-08-19 DIAGNOSIS — I420 Dilated cardiomyopathy: Secondary | ICD-10-CM | POA: Diagnosis not present

## 2022-08-19 DIAGNOSIS — E119 Type 2 diabetes mellitus without complications: Secondary | ICD-10-CM | POA: Diagnosis not present

## 2022-08-19 DIAGNOSIS — Z9181 History of falling: Secondary | ICD-10-CM | POA: Diagnosis not present

## 2022-08-19 DIAGNOSIS — Z48815 Encounter for surgical aftercare following surgery on the digestive system: Secondary | ICD-10-CM | POA: Diagnosis not present

## 2022-08-19 DIAGNOSIS — K429 Umbilical hernia without obstruction or gangrene: Secondary | ICD-10-CM | POA: Diagnosis not present

## 2022-08-19 DIAGNOSIS — I4892 Unspecified atrial flutter: Secondary | ICD-10-CM | POA: Diagnosis not present

## 2022-08-19 DIAGNOSIS — I2510 Atherosclerotic heart disease of native coronary artery without angina pectoris: Secondary | ICD-10-CM | POA: Diagnosis not present

## 2022-08-19 DIAGNOSIS — E785 Hyperlipidemia, unspecified: Secondary | ICD-10-CM | POA: Diagnosis not present

## 2022-08-19 DIAGNOSIS — Z5289 Donor of other specified organs or tissues: Secondary | ICD-10-CM | POA: Diagnosis not present

## 2022-08-19 DIAGNOSIS — Z7901 Long term (current) use of anticoagulants: Secondary | ICD-10-CM | POA: Diagnosis not present

## 2022-08-19 DIAGNOSIS — G4733 Obstructive sleep apnea (adult) (pediatric): Secondary | ICD-10-CM | POA: Diagnosis not present

## 2022-08-19 DIAGNOSIS — I5022 Chronic systolic (congestive) heart failure: Secondary | ICD-10-CM | POA: Diagnosis not present

## 2022-08-19 LAB — POCT INR: INR: 1.9 — AB (ref 2.0–3.0)

## 2022-08-19 NOTE — Progress Notes (Signed)
Agree. Thanks

## 2022-08-19 NOTE — Progress Notes (Signed)
Pt has HH nurse, [name], RN from [facility], and checked INR at home and was 1.9. [name]'s number is [phone] Increase dose today to take 1 tablet and then change weekly dose  to take 1 tablet daily except take 1 tablet on Tuesday, Thursday and Saturday.  Recheck in 1 week.

## 2022-08-19 NOTE — Patient Instructions (Addendum)
Pre visit review using our clinic review tool, if applicable. No additional management support is needed unless otherwise documented below in the visit note.  Increase dose today to take 1 tablet and then change weekly dose  to take 1 tablet daily except take 1 tablet on Tuesday, Thursday and Saturday.  Recheck in 1 week.

## 2022-08-20 DIAGNOSIS — I2510 Atherosclerotic heart disease of native coronary artery without angina pectoris: Secondary | ICD-10-CM | POA: Diagnosis not present

## 2022-08-20 DIAGNOSIS — Z48815 Encounter for surgical aftercare following surgery on the digestive system: Secondary | ICD-10-CM | POA: Diagnosis not present

## 2022-08-20 DIAGNOSIS — I420 Dilated cardiomyopathy: Secondary | ICD-10-CM | POA: Diagnosis not present

## 2022-08-20 DIAGNOSIS — E785 Hyperlipidemia, unspecified: Secondary | ICD-10-CM | POA: Diagnosis not present

## 2022-08-20 DIAGNOSIS — Z7901 Long term (current) use of anticoagulants: Secondary | ICD-10-CM | POA: Diagnosis not present

## 2022-08-20 DIAGNOSIS — I480 Paroxysmal atrial fibrillation: Secondary | ICD-10-CM | POA: Diagnosis not present

## 2022-08-20 DIAGNOSIS — K429 Umbilical hernia without obstruction or gangrene: Secondary | ICD-10-CM | POA: Diagnosis not present

## 2022-08-20 DIAGNOSIS — I714 Abdominal aortic aneurysm, without rupture, unspecified: Secondary | ICD-10-CM | POA: Diagnosis not present

## 2022-08-20 DIAGNOSIS — I5022 Chronic systolic (congestive) heart failure: Secondary | ICD-10-CM | POA: Diagnosis not present

## 2022-08-20 DIAGNOSIS — Z7984 Long term (current) use of oral hypoglycemic drugs: Secondary | ICD-10-CM | POA: Diagnosis not present

## 2022-08-20 DIAGNOSIS — E119 Type 2 diabetes mellitus without complications: Secondary | ICD-10-CM | POA: Diagnosis not present

## 2022-08-20 DIAGNOSIS — Z9181 History of falling: Secondary | ICD-10-CM | POA: Diagnosis not present

## 2022-08-20 DIAGNOSIS — I4892 Unspecified atrial flutter: Secondary | ICD-10-CM | POA: Diagnosis not present

## 2022-08-20 DIAGNOSIS — Z5289 Donor of other specified organs or tissues: Secondary | ICD-10-CM | POA: Diagnosis not present

## 2022-08-20 DIAGNOSIS — G4733 Obstructive sleep apnea (adult) (pediatric): Secondary | ICD-10-CM | POA: Diagnosis not present

## 2022-08-23 ENCOUNTER — Inpatient Hospital Stay: Payer: Medicare Other | Attending: Oncology

## 2022-08-23 DIAGNOSIS — D509 Iron deficiency anemia, unspecified: Secondary | ICD-10-CM | POA: Diagnosis not present

## 2022-08-23 LAB — CBC
HCT: 37.1 % — ABNORMAL LOW (ref 39.0–52.0)
Hemoglobin: 11.7 g/dL — ABNORMAL LOW (ref 13.0–17.0)
MCH: 27.9 pg (ref 26.0–34.0)
MCHC: 31.5 g/dL (ref 30.0–36.0)
MCV: 88.3 fL (ref 80.0–100.0)
Platelets: 319 10*3/uL (ref 150–400)
RBC: 4.2 MIL/uL — ABNORMAL LOW (ref 4.22–5.81)
RDW: 16.7 % — ABNORMAL HIGH (ref 11.5–15.5)
WBC: 10.3 10*3/uL (ref 4.0–10.5)
nRBC: 0 % (ref 0.0–0.2)

## 2022-08-23 LAB — FERRITIN: Ferritin: 46 ng/mL (ref 24–336)

## 2022-08-23 LAB — IRON AND TIBC
Iron: 43 ug/dL — ABNORMAL LOW (ref 45–182)
Saturation Ratios: 13 % — ABNORMAL LOW (ref 17.9–39.5)
TIBC: 337 ug/dL (ref 250–450)
UIBC: 294 ug/dL

## 2022-08-24 DIAGNOSIS — I5022 Chronic systolic (congestive) heart failure: Secondary | ICD-10-CM | POA: Diagnosis not present

## 2022-08-24 DIAGNOSIS — I4892 Unspecified atrial flutter: Secondary | ICD-10-CM | POA: Diagnosis not present

## 2022-08-24 DIAGNOSIS — G4733 Obstructive sleep apnea (adult) (pediatric): Secondary | ICD-10-CM | POA: Diagnosis not present

## 2022-08-24 DIAGNOSIS — Z7984 Long term (current) use of oral hypoglycemic drugs: Secondary | ICD-10-CM | POA: Diagnosis not present

## 2022-08-24 DIAGNOSIS — I420 Dilated cardiomyopathy: Secondary | ICD-10-CM | POA: Diagnosis not present

## 2022-08-24 DIAGNOSIS — I480 Paroxysmal atrial fibrillation: Secondary | ICD-10-CM | POA: Diagnosis not present

## 2022-08-24 DIAGNOSIS — Z5289 Donor of other specified organs or tissues: Secondary | ICD-10-CM | POA: Diagnosis not present

## 2022-08-24 DIAGNOSIS — I2510 Atherosclerotic heart disease of native coronary artery without angina pectoris: Secondary | ICD-10-CM | POA: Diagnosis not present

## 2022-08-24 DIAGNOSIS — Z48815 Encounter for surgical aftercare following surgery on the digestive system: Secondary | ICD-10-CM | POA: Diagnosis not present

## 2022-08-24 DIAGNOSIS — I714 Abdominal aortic aneurysm, without rupture, unspecified: Secondary | ICD-10-CM | POA: Diagnosis not present

## 2022-08-24 DIAGNOSIS — K429 Umbilical hernia without obstruction or gangrene: Secondary | ICD-10-CM | POA: Diagnosis not present

## 2022-08-24 DIAGNOSIS — E785 Hyperlipidemia, unspecified: Secondary | ICD-10-CM | POA: Diagnosis not present

## 2022-08-24 DIAGNOSIS — E119 Type 2 diabetes mellitus without complications: Secondary | ICD-10-CM | POA: Diagnosis not present

## 2022-08-24 DIAGNOSIS — Z7901 Long term (current) use of anticoagulants: Secondary | ICD-10-CM | POA: Diagnosis not present

## 2022-08-24 DIAGNOSIS — Z9181 History of falling: Secondary | ICD-10-CM | POA: Diagnosis not present

## 2022-08-26 ENCOUNTER — Ambulatory Visit (INDEPENDENT_AMBULATORY_CARE_PROVIDER_SITE_OTHER): Payer: Medicare Other

## 2022-08-26 DIAGNOSIS — K429 Umbilical hernia without obstruction or gangrene: Secondary | ICD-10-CM | POA: Diagnosis not present

## 2022-08-26 DIAGNOSIS — Z7901 Long term (current) use of anticoagulants: Secondary | ICD-10-CM

## 2022-08-26 DIAGNOSIS — G4733 Obstructive sleep apnea (adult) (pediatric): Secondary | ICD-10-CM | POA: Diagnosis not present

## 2022-08-26 DIAGNOSIS — E119 Type 2 diabetes mellitus without complications: Secondary | ICD-10-CM | POA: Diagnosis not present

## 2022-08-26 DIAGNOSIS — I420 Dilated cardiomyopathy: Secondary | ICD-10-CM | POA: Diagnosis not present

## 2022-08-26 DIAGNOSIS — Z9181 History of falling: Secondary | ICD-10-CM | POA: Diagnosis not present

## 2022-08-26 DIAGNOSIS — Z48815 Encounter for surgical aftercare following surgery on the digestive system: Secondary | ICD-10-CM | POA: Diagnosis not present

## 2022-08-26 DIAGNOSIS — I480 Paroxysmal atrial fibrillation: Secondary | ICD-10-CM | POA: Diagnosis not present

## 2022-08-26 DIAGNOSIS — I2510 Atherosclerotic heart disease of native coronary artery without angina pectoris: Secondary | ICD-10-CM | POA: Diagnosis not present

## 2022-08-26 DIAGNOSIS — I714 Abdominal aortic aneurysm, without rupture, unspecified: Secondary | ICD-10-CM | POA: Diagnosis not present

## 2022-08-26 DIAGNOSIS — I4892 Unspecified atrial flutter: Secondary | ICD-10-CM | POA: Diagnosis not present

## 2022-08-26 DIAGNOSIS — I5022 Chronic systolic (congestive) heart failure: Secondary | ICD-10-CM | POA: Diagnosis not present

## 2022-08-26 DIAGNOSIS — Z5289 Donor of other specified organs or tissues: Secondary | ICD-10-CM | POA: Diagnosis not present

## 2022-08-26 DIAGNOSIS — Z7984 Long term (current) use of oral hypoglycemic drugs: Secondary | ICD-10-CM | POA: Diagnosis not present

## 2022-08-26 DIAGNOSIS — E785 Hyperlipidemia, unspecified: Secondary | ICD-10-CM | POA: Diagnosis not present

## 2022-08-26 LAB — POCT INR: INR: 2.8 (ref 2.0–3.0)

## 2022-08-26 NOTE — Progress Notes (Signed)
Pt reports nausea, gas, constipation with bouts of loose stool. Pt has apt with surgeon tomorrow. Advised if anything is prescribed by surgeon tomorrow to contact coumadin clinic. Pt will not go to ER but was advised to go to ER. Advised if symptoms worsen or he develops any new symptoms to go to ER. Pt verbalized understanding.  Pt has HH nurse, [name], RN from [facility], and checked INR at home and was 2.8. [name]'s number is [phone] Continue 1 tablet daily except take 1/2 tablet on Tuesday, Thursday and Saturday.  Recheck in 1 week.  Advised [name] and pt on phone of dosing changes and recheck in 1 week. Both verbalized understanding.

## 2022-08-26 NOTE — Patient Instructions (Addendum)
Pre visit review using our clinic review tool, if applicable. No additional management support is needed unless otherwise documented below in the visit note.  Continue 1 tablet daily except take 1/2 tablet on Tuesday, Thursday and Saturday.  Recheck in 1 week.

## 2022-08-27 ENCOUNTER — Telehealth: Payer: Self-pay

## 2022-08-27 ENCOUNTER — Encounter: Payer: Self-pay | Admitting: Surgery

## 2022-08-27 ENCOUNTER — Ambulatory Visit (INDEPENDENT_AMBULATORY_CARE_PROVIDER_SITE_OTHER): Payer: Medicare Other | Admitting: Surgery

## 2022-08-27 VITALS — BP 142/84 | HR 82 | Temp 98.0°F | Ht 71.0 in | Wt 218.0 lb

## 2022-08-27 DIAGNOSIS — Z09 Encounter for follow-up examination after completed treatment for conditions other than malignant neoplasm: Secondary | ICD-10-CM

## 2022-08-27 DIAGNOSIS — K5903 Drug induced constipation: Secondary | ICD-10-CM | POA: Diagnosis not present

## 2022-08-27 DIAGNOSIS — K429 Umbilical hernia without obstruction or gangrene: Secondary | ICD-10-CM | POA: Diagnosis not present

## 2022-08-27 DIAGNOSIS — L723 Sebaceous cyst: Secondary | ICD-10-CM | POA: Diagnosis not present

## 2022-08-27 NOTE — Patient Instructions (Signed)
You may increase your activity as tolerated.  We want you to get a bottle of Magnesium Citrate and drink the whole thing. Fruit flavors are more palatable. We also want you to get a full size Fleets enema and use as directed. We need to get you to have several good bowel movements.  If you do not have a bowel movement by tomorrow evening you may need to be seen at the hospital. If you start vomiting and having increased nausea you will need to go to the ER as this is signs of a blockage.   Magnesium Citrate Solution What is this medication? MAGNESIUM CITRATE (mag NEE zee um SI treyt) treats occasional constipation. It works by increasing the amount of water your intestine absorbs. This softens the stool, making it easier to have a bowel movement. It also increases pressure, which prompts the muscles in your intestines to move stool. It belongs to a group of medications called laxatives. This medicine may be used for other purposes; ask your health care provider or pharmacist if you have questions. COMMON BRAND NAME(S): Citroma What should I tell my care team before I take this medication? They need to know if you have any of these conditions: Are on a low magnesium or low sodium diet Change in bowel habits for 2 weeks Colostomy or ileostomy Constipation after using another laxative for 7 days Diabetes Kidney disease Rectal bleeding Stomach pain, nausea, or vomiting An unusual or allergic reaction to magnesium citrate, other magnesium products, other medications, foods, dyes, or preservatives Pregnant or trying to get pregnant Breast-feeding How should I use this medication? Take this medication by mouth. Follow the directions on the package or prescription label. Use a specially marked spoon or container to measure each dose. Ask your pharmacist if you do not have one. Household spoons are not accurate. Drink a full glass of fluid with each dose of this medication. This medication may taste  better if it is chilled before you drink it. Do not take your medication more often than directed. Talk to your care team about the use of this medication in children. While this medication may be prescribed for children as young as 55 years of age for selected conditions, precautions do apply. Overdosage: If you think you have taken too much of this medicine contact a poison control center or emergency room at once. NOTE: This medicine is only for you. Do not share this medicine with others. What if I miss a dose? This does not apply; this medication is not for regular use. What may interact with this medication? Cellulose sodium phosphate Digoxin Edetate disodium, EDTA Medications for bone strength like etidronate, ibandronate, risedronate Sodium polystyrene sulfonate Some antibiotics like ciprofloxacin, doxycycline, gatifloxacin, levofloxacin, tetracycline Vitamin D This list may not describe all possible interactions. Give your health care provider a list of all the medicines, herbs, non-prescription drugs, or dietary supplements you use. Also tell them if you smoke, drink alcohol, or use illegal drugs. Some items may interact with your medicine. What should I watch for while using this medication? Tell your care team if your symptoms do not start to get better or if they get worse. Do not take any other medication by mouth within 2 hours of taking this medication. What side effects may I notice from receiving this medication? Side effects that you should report to your care team as soon as possible: Allergic reactions--skin rash, itching, hives, swelling of the face, lips, tongue, or throat High magnesium level--confusion,  drowsiness, facial flushing, redness, sweating, muscle weakness, fast or irregular heartbeat, trouble breathing Side effects that usually do not require medical attention (report to your care team if they continue or are bothersome): Diarrhea Nausea Stomach  pain Vomiting This list may not describe all possible side effects. Call your doctor for medical advice about side effects. You may report side effects to FDA at 1-800-FDA-1088. Where should I keep my medication? Keep out of the reach of children. Store at room temperature or in the refrigerator between 8 and 30 degrees C (46 and 86 degrees F). Throw away any unused medication 24 hours after opening the bottle. Throw away unopened bottles of medication after the expiration date. NOTE: This sheet is a summary. It may not cover all possible information. If you have questions about this medicine, talk to your doctor, pharmacist, or health care provider.  2023 Elsevier/Gold Standard (2020-12-12 00:00:00)

## 2022-08-27 NOTE — Telephone Encounter (Signed)
Pt and HH nurse reported yesterday when reporting INR that pt had apt with surgeon today and that pt was very constipated and having nausea. Advised pt this nurse would check chart to inquire what medications may be prescribed by surgeon to assure they do not interact with warfarin.  Magnesium citrate and fleet enema were prescribed. Neither interact with warfarin. Contacted pt and advised of no interaction and to follow surgeons instructions. Pt verbalized understanding and was appreciative of the call.

## 2022-08-27 NOTE — Progress Notes (Signed)
08/27/2022  History of Present Illness: [name] is a 67 y.o. male status post robotic assisted umbilical hernia repair and excision of 2 sebaceous cysts of the lower back on 08/05/2022.  He was last seen on 08/13/22 at which time his drain was removed.  He reports today that he's been having more abdominal pain, but not related to the incisions but because of constipation.  He reports that he has not had a good bowel movement since his surgery.  His bowel movements have been small and only every so often.  More recently, his bowel movements have been in the form of diarrhea.  Denies any nausea or vomiting and is able to eat well and keep food down.  He has tried Miralax, stool softeners, and milk of magnesia without good success.  His incisions are healing well and has not had any issues with them.  Past Medical History: Past Medical History:  Diagnosis Date   Adrenal nodule (HCC) 12/28/2021   a.) CT abd/pel 12/28/2021: LEFT adrenal nodule measuring 1.6 cm   Anemia    Angina    Anxiety    Aortic atherosclerosis (HCC)    Arthritis    Atrial fibrillation and flutter (HCC) 2013   a.) CHA2DS2VASc = 5 (age, CHF, HTN, vascular disease history, T2DM);  b.) s/p DCCV (100 J) 10/27/2010; c.) s/p ablation 09/21/2011; d.) rate/rhythm maintained on oral amiodarone + carvedilol; chronically anticoagulated with warfarin   CAD (coronary artery disease) 07/15/2010   a.) LHC 07/15/2010: 30% pLAD, 40% D1, 25%/30% pLCx, 30% mLCx - med mgmt   CHF (congestive heart failure) - NYHA Class II/III 2013   a.) TTE 02/26/2013: EF 50%, mild MR/TR; b.) EST 06/13/2017: EF 50%, mild LCH, triv MR/TR; c.) TTTE 03/24/2020: EF >55%, mild LVH, mild MAC, mild BAE, triv PR, mild MR/TR, G1DD; d.) TTE 11/19/2021: EF 60-65%, mod reduced RVSF, mild LAE, mod MAC, Ao root 37 mm, G2DD   COPD (chronic obstructive pulmonary disease) (HCC)    Diabetes type 2, uncontrolled    declines DSME   Dilated cardiomyopathy (HCC) 2013   a.) EF  25% at time of Dx in 2013; b.) TTE 02/26/2013: EF 50%; c.) EST 06/13/2017: EF 50%;  d.) TTE 03/24/2020: EF >55%; e.) TTE 11/19/2021: EF 60-65%   Ex-smoker    GERD (gastroesophageal reflux disease)    Hepatic steatosis    Hyperlipidemia    Hypertension    Infrarenal abdominal aortic aneurysm (AAA) without rupture (HCC) 08/11/2016   a.) CT abd/pel 08/11/2016: 3.3 cm; b.) CT abd/pel 12/22/2018: 3.3 cm; c.) CT abd/pel 08/31/2021: 3.7 cm; d.) CT abd/pel 12/28/2021: 3.9 cm   Long term current use of amiodarone 2023   a.) previously on dranaderone (cost prohibitive); switched to amiodarone 2023   Long term current use of anticoagulant    a.) warfarin   Marijuana use    a.) UDS (+) of THC x 3 (02/06/2013, 06/29/2013, 09/03/2013); PCP advised "one more and we will stop prescribing ativan (08/2013)"   Migraine    Narcolepsy    Obesity    OSA (obstructive sleep apnea)    a.) unable to tolerate nocturnal PAP therapy; uses supplemetal oxygen at 2L/[place] during sleep   Seasonal allergies    Umbilical hernia      Past Surgical History: Past Surgical History:  Procedure Laterality Date   ATRIAL FLUTTER ABLATION N/A 09/21/2011   Procedure: ATRIAL FLUTTER ABLATION;  Surgeon: Hillis Range, MD;  Location[facility] CATH LAB;  Service: Cardiovascular;  Laterality:  N/A;   CARDIAC ELECTROPHYSIOLOGY MAPPING AND ABLATION  2013   for atrial flutter   CARDIOVASCULAR STRESS TEST  03/2012   ETT WNL Gwen Pounds)   CARDIOVERSION N/A 08/12/2021   Procedure: CARDIOVERSION;  Surgeon: Lamar Blinks, MD;  Location: [facility] ORS;  Service: Cardiovascular;  Laterality: N/A;   COLONOSCOPY WITH PROPOFOL N/A 05/22/2020   TA, diverticulosis, descending colon ulcer biopsy WNL, int hem Servando Snare, Darren, MD)   COLONOSCOPY WITH PROPOFOL N/A 04/28/2022   multiple TAs - 10 small, 2 1.2cm, 2 9mm, rpt 3 yrs (Vanga, Loel Dubonnet, MD)   CYST EXCISION N/A 08/05/2022   excision of back cysts x 2 (pathology returned epidermal inclusion cysts);   Surgeon: Henrene Dodge, MD   ESOPHAGOGASTRODUODENOSCOPY (EGD) WITH PROPOFOL N/A 05/22/2020   WNL (Wohl)   ESOPHAGOGASTRODUODENOSCOPY (EGD) WITH PROPOFOL N/A 04/28/2022   12mm duodenal TA, reactive gastropathy, enteric mucosa in stomach biopsy of unclear significance (Vanga, Loel Dubonnet, MD)   GIVENS CAPSULE STUDY  04/28/2022   Procedure: GIVENS CAPSULE STUDY;  Surgeon: Toney Reil, MD;  Location: [facility] ENDOSCOPY;  Service: Endoscopy;;   US ECHOCARDIOGRAPHY  2014   EF 50%, nl LV fxn, RV nl size/function, mild mitral insuff    Home Medications: Prior to Admission medications   Medication Sig Start Date End Date Taking? Authorizing Provider  ACCU-CHEK FASTCLIX LANCETS MISC Check blood sugar once daily and as instructed. Dx 250.00 05/10/13  Yes Eustaquio Boyden, MD  acetaminophen (TYLENOL) 500 MG tablet Take 2 tablets (1,000 mg total) by mouth every 6 (six) hours as needed for mild pain. 08/05/22  Yes Cristol Engdahl, Elita Quick, MD  albuterol (VENTOLIN HFA) 108 (90 Base) MCG/ACT inhaler Inhale 2 puffs into the lungs every 6 (six) hours as needed for wheezing or shortness of breath. 01/12/22  Yes Eustaquio Boyden, MD  amiodarone (PACERONE) 200 MG tablet Take 1 tablet (200 mg total) by mouth daily. Patient taking differently: Take 200 mg by mouth every morning. 09/16/21  Yes Lanier Prude, MD  Blood Glucose Monitoring Suppl (BLOOD GLUCOSE MONITOR SYSTEM) W/DEVICE KIT by Does not apply route. Use to check sugar once daily and as needed Dx: E11.9 **ONE TOUCH VERIO**   Yes [provider]  Budeson-Glycopyrrol-Formoterol (BREZTRI AEROSPHERE) 160-9-4.8 MCG/ACT AERO Inhale 2 puffs into the lungs 2 (two) times daily. 10/23/21  Yes Eustaquio Boyden, MD  carvedilol (COREG) 6.25 MG tablet TAKE 1 TABLET BY MOUTH TWICE A DAY WITH FOOD 07/28/22  Yes Eustaquio Boyden, MD  Cholecalciferol (VITAMIN D) 50 MCG (2000 UT) CAPS Take 1 capsule (2,000 Units total) by mouth daily. 02/05/19  Yes Eustaquio Boyden, MD   dapagliflozin propanediol (FARXIGA) 10 MG TABS tablet Take 1 tablet (10 mg total) by mouth daily before breakfast. 03/01/22  Yes Delma Freeze, FNP  Docusate Calcium (STOOL SOFTENER PO) Take 1 tablet by mouth daily.   Yes [provider]  Fe Fum-Fe Poly-Vit C-Lactobac (FUSION PO) Take 1 tablet by mouth every other day. Patient states received sample.  Unsure of mg.   Yes [provider]  FLUoxetine (PROZAC) 40 MG capsule Take 1 capsule (40 mg total) by mouth daily. Patient taking differently: Take 40 mg by mouth every morning. 02/19/22  Yes Eustaquio Boyden, MD  fluticasone [facility]) 50 MCG/ACT nasal spray Place 2 sprays into both nostrils daily. Patient taking differently: Place 2 sprays into both nostrils as needed. 01/12/22  Yes Eustaquio Boyden, MD  furosemide (LASIX) 80 MG tablet TAKE 1 TABLET BY MOUTH 2 TIMES DAILY. 05/26/22  Yes  Eustaquio Boyden, MD  glimepiride (AMARYL) 4 MG tablet Take 1 tablet (4 mg total) by mouth daily with breakfast. 01/14/22  Yes Eustaquio Boyden, MD  glucose blood [facility] VERIO) test strip Check blood sugar 3 times a day 10/22/21  Yes Eustaquio Boyden, MD  ipratropium-albuterol (DUONEB) 0.5-2.5 (3) MG/3ML SOLN TAKE 3 MLS BY NEBULIZATION EVERY 6 (SIX) HOURS AS NEEDED (SEVERE SHORTNESS OF BREATH/WHEEZING). 05/31/22  Yes Eustaquio Boyden, MD  metFORMIN (GLUCOPHAGE) 500 MG tablet Take 1 tablet (500 mg total) by mouth 2 (two) times daily with a meal. 07/28/22  Yes Eustaquio Boyden, MD  methocarbamol (ROBAXIN) 500 MG tablet Take 1 tablet (500 mg total) by mouth 3 (three) times daily as needed for muscle spasms (sedation precautions). 07/02/22  Yes Eustaquio Boyden, MD  montelukast (SINGULAIR) 10 MG tablet TAKE 1 TABLET BY MOUTH EVERYDAY AT BEDTIME Patient taking differently: Take 10 mg by mouth at bedtime. 07/06/22  Yes Eustaquio Boyden, MD  ondansetron (ZOFRAN) 4 MG tablet Take 1 tablet (4 mg total) by mouth daily as needed for nausea or vomiting.  08/05/22 08/05/23 Yes Keidan Aumiller, MD  OVER THE COUNTER MEDICATION Take 1 tablet by mouth daily. Superbeets   Yes [provider]  OVER THE COUNTER MEDICATION Take 1 capsule by mouth daily at 6 (six) AM. [place] sea moss   Yes [provider]  oxyCODONE (OXY IR/ROXICODONE) 5 MG immediate release tablet Take 1 tablet (5 mg total) by mouth every 4 (four) hours as needed for severe pain. 08/05/22  Yes Emmalena Canny, Elita Quick, MD  OXYGEN Inhale 2 L into the lungs at bedtime.   Yes [provider]  pantoprazole (PROTONIX) 40 MG tablet TAKE 1 TABLET (40 MG TOTAL) BY MOUTH TWICE A DAY BEFORE MEALS Patient taking differently: 40 mg 2 (two) times daily before a meal. 07/13/22  Yes Eustaquio Boyden, MD  potassium chloride (KLOR-CON M) 10 MEQ tablet Take 1 tablet (10 mEq total) by mouth daily. And an extra one on the days you take metolazone Patient taking differently: Take 10 mEq by mouth every morning. And an extra one on the days you take metolazone 02/25/22  Yes Clarisa Kindred A, FNP  Probiotic Product (PROBIOTIC DAILY PO) Take 1 tablet by mouth daily.   Yes [provider]  Saw Palmetto 450 MG CAPS Take 1 capsule (450 mg total) by mouth daily. 03/29/22  Yes Eustaquio Boyden, MD  simvastatin (ZOCOR) 20 MG tablet TAKE 1 TABLET BY MOUTH EVERY DAY IN THE EVENING Patient taking differently: Take 20 mg by mouth daily at 6 PM. TAKE 1 TABLET BY MOUTH EVERY DAY IN THE EVENING 05/18/22  Yes Eustaquio Boyden, MD  Turmeric (QC TUMERIC COMPLEX PO) Take 1 tablet by mouth daily at 6 (six) AM.   Yes [provider]  vitamin B-12 (CYANOCOBALAMIN) 1000 MCG tablet Take 1 tablet (1,000 mcg total) by mouth every Monday, Wednesday, and Friday. Patient taking differently: Take 1,000 mcg by mouth daily. 07/22/21  Yes Eustaquio Boyden, MD  warfarin (COUMADIN) 5 MG tablet TAKE 1/2 TABLET BY MOUTH DAILY EXCEPT TAKE 1 TABLET ON MONDAYS, WEDNESDAYS AND FRIDAYS OR AS DIRECTED BY ANTICOAGULATION  CLINIC Patient taking differently: Take 2.5-5 mg by mouth daily. TAKE 1/2 TABLET BY MOUTH DAILY EXCEPT TAKE 1 TABLET ON MONDAYS, WEDNESDAYS AND FRIDAYS OR AS DIRECTED BY ANTICOAGULATION CLINIC 07/22/22  Yes Eustaquio Boyden, MD    Allergies: Allergies  Allergen Reactions   Atorvastatin Other (See Comments)    Dizziness   Lisinopril Cough  Review of Systems: Review of Systems  Constitutional:  Negative for chills and fever.  Respiratory:  Negative for shortness of breath.   Cardiovascular:  Negative for chest pain.  Gastrointestinal:  Positive for abdominal pain. Negative for nausea and vomiting.  Skin:        Bruising around the umbilicus.    Physical Exam BP (!) 142/84   Pulse 82   Temp 98 F (36.7 C)   Ht 5\' 11"  (1.803 m)   Wt 218 lb (98.9 kg)   SpO2 98%   BMI 30.40 kg/m  CONSTITUTIONAL: No acute distress, well-nourished HEENT:  Normocephalic, atraumatic, extraocular motion intact. RESPIRATORY:  Normal respiratory effort without pathologic use of accessory muscles. CARDIOVASCULAR: Irregular rhythm, regular rate.Marland Kitchen GI: The abdomen is soft, distended, without tenderness to palpation.  No tympany on percussion.  Patient's robotic incisions are clean, dry, intact.   SKIN: Lower back incisions are healing appropriately and are clean, dry, intact. NEUROLOGIC:  Motor and sensation is grossly normal.  Cranial nerves are grossly intact. PSYCH:  Alert and oriented to person, place and time. Affect is normal.  Assessment and Plan: This is a 67 y.o. male robotic assisted umbilical hernia repair and open excision of 2 lower back cysts.  - The patient's having significant constipation issues post-op.  I think it's a combination of the pain medication and the iron pills that he takes.  He has paused the iron pills recently so he's not making things worse.  Recommended that he try a Fleet's enema as well as magnesium citrate to help.  He is worried about a possible blockage.  Discussed  with him that although that is technically possible, he's not having any nausea/vomiting, so I think it's more likely constipation.  However, discussed with him that if there's no improvement with the two medications, that he should come to the ER for further evaluation.  May need CT scan. --Follow up as needed.  I spent 20 minutes dedicated to the care of this patient on the date of this encounter to include pre-visit review of records, face-to-face time with the patient discussing diagnosis and management, and any post-visit coordination of care.   Howie Ill, MD [facility]

## 2022-08-30 ENCOUNTER — Encounter: Payer: Self-pay | Admitting: Family Medicine

## 2022-08-30 ENCOUNTER — Ambulatory Visit (INDEPENDENT_AMBULATORY_CARE_PROVIDER_SITE_OTHER): Payer: Medicare Other | Admitting: Family Medicine

## 2022-08-30 VITALS — BP 134/68 | HR 59 | Temp 97.6°F | Ht 71.0 in | Wt 224.5 lb

## 2022-08-30 DIAGNOSIS — I5032 Chronic diastolic (congestive) heart failure: Secondary | ICD-10-CM

## 2022-08-30 DIAGNOSIS — R600 Localized edema: Secondary | ICD-10-CM

## 2022-08-30 DIAGNOSIS — Z8719 Personal history of other diseases of the digestive system: Secondary | ICD-10-CM

## 2022-08-30 DIAGNOSIS — K5900 Constipation, unspecified: Secondary | ICD-10-CM | POA: Diagnosis not present

## 2022-08-30 DIAGNOSIS — Z9889 Other specified postprocedural states: Secondary | ICD-10-CM | POA: Diagnosis not present

## 2022-08-30 DIAGNOSIS — R140 Abdominal distension (gaseous): Secondary | ICD-10-CM

## 2022-08-30 DIAGNOSIS — Z7984 Long term (current) use of oral hypoglycemic drugs: Secondary | ICD-10-CM | POA: Diagnosis not present

## 2022-08-30 DIAGNOSIS — Z872 Personal history of diseases of the skin and subcutaneous tissue: Secondary | ICD-10-CM

## 2022-08-30 DIAGNOSIS — E1169 Type 2 diabetes mellitus with other specified complication: Secondary | ICD-10-CM

## 2022-08-30 DIAGNOSIS — D508 Other iron deficiency anemias: Secondary | ICD-10-CM | POA: Diagnosis not present

## 2022-08-30 NOTE — Assessment & Plan Note (Signed)
Continues lasix 80mg  BID. In setting of resolved pedal edema and recent worsened renal insufficiency, will stop weekly metolazone,

## 2022-08-30 NOTE — Assessment & Plan Note (Signed)
Doing well on lower metformin dose 500mg  BID. Continues glimepiride 4mg  and farxiga 10mg  daily. Desires to trial off glimepiride - will hold, with option to restart lower 2mg  dose if cbg's trend up.

## 2022-08-30 NOTE — Assessment & Plan Note (Signed)
Post-op constipation exacerbated by iron use, currently on hold.  No improvement initially with Miralax, MOM - did improve after magnesium citrate use.  Discussed bland diet for next several days followed by increased fiber and water in diet.  He is now regularly taking miralax with PRN MOM. Discussed PRN mag citrate for severe constipation. May continue simethicone (Gas-X) PRN gas.

## 2022-08-30 NOTE — Progress Notes (Signed)
Ph: [phone]       Fax: [phone]   Patient ID: [name], male    DOB: 05-08-1955, 67 y.o.   MRN: [id number]  This visit was conducted in person.  BP 134/68   Pulse (!) 59   Temp 97.6 F (36.4 C) (Temporal)   Ht 5\' 11"  (1.803 m)   Wt 224 lb 8 oz (101.8 kg)   SpO2 94%   BMI 31.31 kg/m    CC: constipation  Subjective:   HPI: [name] is a 67 y.o. male presenting on 08/30/2022 for Constipation (C/o severe constipation since surgery on 08/05/22. Engineer, civil (consulting) recommended magnesium citrate, which pt wants to discuss. )   Recent robotic assisted umbilical hernia repair as well as excision of 2 sebaceous cysts to lower back 08/05/2022. Saw surgeon Dr Aleen Campi last week for post-op f/u visit with good report. Having some severe constipation since surgery, only intermittent small bowel movements, passing gas fine.this is despite miralax, stool softeners, and milk of magnesia. Was suggested trying fleet's enema as well as magnesium citrate. He also recently held oral iron supplements.  Magnesium citrate significantly helped on Saturday but again felt constipated today. Today drank some MOM and miralax.  Main concern is ongoing gassiness and bloating.  No leg swelling   Overall however feeling very well after surgery. Notes abdominal pain, back pain has resolved.   Again feeling blurry vision and dizziness, ongoing lethargy.  Saw eye doctor at [facility] - reassuring evaluation.  Cbg's well controlled, today 103, yesterday 84.  Lab Results  Component Value Date   HGBA1C 6.0 07/26/2022    Known sCHF, afib followed by Dr Gwen Pounds. Known severe OSA intolerant of CPAP, not inspire candidate.    Due to worsening GFR 46, last month metformin dose dropped to 500mg  BID and weekly metolazone stopped.  Lab Results  Component Value Date   CREATININE 1.10 08/05/2022   BUN 15 08/05/2022   NA 139 08/05/2022   K 3.3 (L) 08/05/2022   CL 92 (L) 08/05/2022   CO2 36 (H)  07/26/2022  GFR >60  Lab Results  Component Value Date   WBC 10.3 08/23/2022   HGB 11.7 (L) 08/23/2022   HCT 37.1 (L) 08/23/2022   MCV 88.3 08/23/2022   PLT 319 08/23/2022    Lab Results  Component Value Date   IRON 43 (L) 08/23/2022   TIBC 337 08/23/2022   FERRITIN 46 08/23/2022       Relevant past medical, surgical, family and social history reviewed and updated as indicated. Interim medical history since our last visit reviewed. Allergies and medications reviewed and updated. Outpatient Medications Prior to Visit  Medication Sig Dispense Refill   ACCU-CHEK FASTCLIX LANCETS MISC Check blood sugar once daily and as instructed. Dx 250.00 100 each 3   acetaminophen (TYLENOL) 500 MG tablet Take 2 tablets (1,000 mg total) by mouth every 6 (six) hours as needed for mild pain.     albuterol (VENTOLIN HFA) 108 (90 Base) MCG/ACT inhaler Inhale 2 puffs into the lungs every 6 (six) hours as needed for wheezing or shortness of breath. 8.5 each 6   amiodarone (PACERONE) 200 MG tablet Take 1 tablet (200 mg total) by mouth daily. (Patient taking differently: Take 200 mg by mouth every morning.) 90 tablet 3   Blood Glucose Monitoring Suppl (BLOOD GLUCOSE MONITOR SYSTEM) W/DEVICE KIT by Does not apply route. Use to check sugar once daily and as needed Dx: E11.9 **ONE TOUCH VERIO**  Budeson-Glycopyrrol-Formoterol (BREZTRI AEROSPHERE) 160-9-4.8 MCG/ACT AERO Inhale 2 puffs into the lungs 2 (two) times daily. 32.1 g 3   carvedilol (COREG) 6.25 MG tablet TAKE 1 TABLET BY MOUTH TWICE A DAY WITH FOOD 180 tablet 1   Cholecalciferol (VITAMIN D) 50 MCG (2000 UT) CAPS Take 1 capsule (2,000 Units total) by mouth daily. 30 capsule    dapagliflozin propanediol (FARXIGA) 10 MG TABS tablet Take 1 tablet (10 mg total) by mouth daily before breakfast. 90 tablet 3   Docusate Calcium (STOOL SOFTENER PO) Take 1 tablet by mouth daily.     Fe Fum-Fe Poly-Vit C-Lactobac (FUSION PO) Take 1 tablet by mouth every other  day. Patient states received sample.  Unsure of mg.     FLUoxetine (PROZAC) 40 MG capsule Take 1 capsule (40 mg total) by mouth daily. (Patient taking differently: Take 40 mg by mouth every morning.) 30 capsule 6   fluticasone (FLONASE) 50 MCG/ACT nasal spray Place 2 sprays into both nostrils daily. (Patient taking differently: Place 2 sprays into both nostrils as needed.) 48 mL 3   furosemide (LASIX) 80 MG tablet TAKE 1 TABLET BY MOUTH 2 TIMES DAILY. 180 tablet 4   glimepiride (AMARYL) 4 MG tablet TAKE 1 TABLET BY MOUTH DAILY WITH BREAKFAST 90 tablet 1   glucose blood (ONETOUCH VERIO) test strip Check blood sugar 3 times a day 300 strip 3   ipratropium-albuterol (DUONEB) 0.5-2.5 (3) MG/3ML SOLN TAKE 3 MLS BY NEBULIZATION EVERY 6 (SIX) HOURS AS NEEDED (SEVERE SHORTNESS OF BREATH/WHEEZING). 360 mL 0   metFORMIN (GLUCOPHAGE) 500 MG tablet Take 1 tablet (500 mg total) by mouth 2 (two) times daily with a meal. 180 tablet 1   methocarbamol (ROBAXIN) 500 MG tablet Take 1 tablet (500 mg total) by mouth 3 (three) times daily as needed for muscle spasms (sedation precautions). 30 tablet 0   montelukast (SINGULAIR) 10 MG tablet TAKE 1 TABLET BY MOUTH EVERYDAY AT BEDTIME (Patient taking differently: Take 10 mg by mouth at bedtime.) 90 tablet 3   ondansetron (ZOFRAN) 4 MG tablet Take 1 tablet (4 mg total) by mouth daily as needed for nausea or vomiting. 30 tablet 1   OVER THE COUNTER MEDICATION Take 1 tablet by mouth daily. Superbeets     OVER THE COUNTER MEDICATION Take 1 capsule by mouth daily at 6 (six) AM. [place] sea moss     oxyCODONE (OXY IR/ROXICODONE) 5 MG immediate release tablet Take 1 tablet (5 mg total) by mouth every 4 (four) hours as needed for severe pain. 30 tablet 0   OXYGEN Inhale 2 L into the lungs at bedtime.     pantoprazole (PROTONIX) 40 MG tablet TAKE 1 TABLET (40 MG TOTAL) BY MOUTH TWICE A DAY BEFORE MEALS (Patient taking differently: 40 mg 2 (two) times daily before a meal.) 180 tablet 4    potassium chloride (KLOR-CON M) 10 MEQ tablet Take 1 tablet (10 mEq total) by mouth daily. And an extra one on the days you take metolazone (Patient taking differently: Take 10 mEq by mouth every morning. And an extra one on the days you take metolazone) 38 tablet 5   Probiotic Product (PROBIOTIC DAILY PO) Take 1 tablet by mouth daily.     Saw Palmetto 450 MG CAPS Take 1 capsule (450 mg total) by mouth daily.  0   simvastatin (ZOCOR) 20 MG tablet TAKE 1 TABLET BY MOUTH EVERY DAY IN THE EVENING (Patient taking differently: Take 20 mg by mouth daily at 6 PM. TAKE  1 TABLET BY MOUTH EVERY DAY IN THE EVENING) 90 tablet 1   Turmeric (QC TUMERIC COMPLEX PO) Take 1 tablet by mouth daily at 6 (six) AM.     vitamin B-12 (CYANOCOBALAMIN) 1000 MCG tablet Take 1 tablet (1,000 mcg total) by mouth every Monday, Wednesday, and Friday. (Patient taking differently: Take 1,000 mcg by mouth daily.)     warfarin (COUMADIN) 5 MG tablet TAKE 1/2 TABLET BY MOUTH DAILY EXCEPT TAKE 1 TABLET ON MONDAYS, WEDNESDAYS AND FRIDAYS OR AS DIRECTED BY ANTICOAGULATION CLINIC (Patient taking differently: Take 2.5-5 mg by mouth daily. TAKE 1/2 TABLET BY MOUTH DAILY EXCEPT TAKE 1 TABLET ON MONDAYS, WEDNESDAYS AND FRIDAYS OR AS DIRECTED BY ANTICOAGULATION CLINIC) 60 tablet 1   No facility-administered medications prior to visit.     Per HPI unless specifically indicated in ROS section below Review of Systems  Objective:  BP 134/68   Pulse (!) 59   Temp 97.6 F (36.4 C) (Temporal)   Ht 5\' 11"  (1.803 m)   Wt 224 lb 8 oz (101.8 kg)   SpO2 94%   BMI 31.31 kg/m   Wt Readings from Last 3 Encounters:  08/30/22 224 lb 8 oz (101.8 kg)  08/27/22 218 lb (98.9 kg)  08/13/22 228 lb 9.6 oz (103.7 kg)      Physical Exam Vitals and nursing note reviewed.  Constitutional:      Appearance: Normal appearance. He is not ill-appearing.  HENT:     Mouth/Throat:     Mouth: Mucous membranes are moist.     Pharynx: Oropharynx is clear. No  oropharyngeal exudate or posterior oropharyngeal erythema.  Cardiovascular:     Rate and Rhythm: Normal rate and regular rhythm.     Pulses: Normal pulses.     Heart sounds: Normal heart sounds. No murmur heard. Pulmonary:     Effort: Pulmonary effort is normal. No respiratory distress.     Breath sounds: Normal breath sounds. No wheezing, rhonchi or rales.  Abdominal:     General: Bowel sounds are normal. There is distension.     Palpations: Abdomen is soft. There is no mass.     Tenderness: There is no abdominal tenderness. There is no guarding or rebound.     Hernia: No hernia is present.  Musculoskeletal:     Right lower leg: No edema.     Left lower leg: No edema.  Skin:    General: Skin is warm and dry.     Comments: Healing surgical incisions  Neurological:     Mental Status: He is alert.  Psychiatric:        Mood and Affect: Mood normal.        Behavior: Behavior normal.       Results for orders placed or performed in visit on 08/26/22  POCT INR  Result Value Ref Range   INR 2.8 2.0 - 3.0    Assessment & Plan:   Problem List Items Addressed This Visit     Type 2 diabetes mellitus with other specified complication (HCC)    Doing well on lower metformin dose 500mg  BID. Continues glimepiride 4mg  and farxiga 10mg  daily. Desires to trial off glimepiride - will hold, with option to restart lower 2mg  dose if cbg's trend up.       Hx of umbilical hernia repair   History of excision of epidermal inclusion cyst   Gassiness   Chronic diastolic heart failure (HCC)    Continues lasix 80mg  BID. In setting of resolved  pedal edema and recent worsened renal insufficiency, will stop weekly metolazone,       Iron deficiency anemia   Pedal edema    This is improved-  stop metolazone as per instructions and above      Constipation - Primary    Post-op constipation exacerbated by iron use, currently on hold.  No improvement initially with Miralax, MOM - did improve after  magnesium citrate use.  Discussed bland diet for next several days followed by increased fiber and water in diet.  He is now regularly taking miralax with PRN MOM. Discussed PRN mag citrate for severe constipation. May continue simethicone (Gas-X) PRN gas.         No orders of the defined types were placed in this encounter.   No orders of the defined types were placed in this encounter.   Patient Instructions  Try holding glimepiride. If sugars running uncontrolled, try only 1/2 tablet glimepiride (2mg  dose).  Try holding weekly metolazone.  Continue daily gas X.  For constipation, bland diet for several days. Then start good fiber in the diet and water - prunes, kiwi, prune juice. Ok to continue miralax 1 capful daily, milk of magnesia as needed, with magnesium citrate for severe constipation. Let us know if not controlled with this.  Return in 2-3 months for diabetes follow up visit   Follow up plan: Return in about 3 months (around 11/30/2022), or if symptoms worsen or fail to improve, for follow up visit.  Eustaquio Boyden, MD

## 2022-08-30 NOTE — Patient Instructions (Addendum)
Try holding glimepiride. If sugars running uncontrolled, try only 1/2 tablet glimepiride (2mg  dose).  Try holding weekly metolazone.  Continue daily gas X.  For constipation, bland diet for several days. Then start good fiber in the diet and water - prunes, kiwi, prune juice. Ok to continue miralax 1 capful daily, milk of magnesia as needed, with magnesium citrate for severe constipation. Let us know if not controlled with this.  Return in 2-3 months for diabetes follow up visit

## 2022-08-30 NOTE — Assessment & Plan Note (Signed)
This is improved-  stop metolazone as per instructions and above

## 2022-08-31 DIAGNOSIS — E785 Hyperlipidemia, unspecified: Secondary | ICD-10-CM | POA: Diagnosis not present

## 2022-08-31 DIAGNOSIS — Z7984 Long term (current) use of oral hypoglycemic drugs: Secondary | ICD-10-CM | POA: Diagnosis not present

## 2022-08-31 DIAGNOSIS — Z48815 Encounter for surgical aftercare following surgery on the digestive system: Secondary | ICD-10-CM | POA: Diagnosis not present

## 2022-08-31 DIAGNOSIS — I480 Paroxysmal atrial fibrillation: Secondary | ICD-10-CM | POA: Diagnosis not present

## 2022-08-31 DIAGNOSIS — K429 Umbilical hernia without obstruction or gangrene: Secondary | ICD-10-CM | POA: Diagnosis not present

## 2022-08-31 DIAGNOSIS — I4892 Unspecified atrial flutter: Secondary | ICD-10-CM | POA: Diagnosis not present

## 2022-08-31 DIAGNOSIS — Z9181 History of falling: Secondary | ICD-10-CM | POA: Diagnosis not present

## 2022-08-31 DIAGNOSIS — I420 Dilated cardiomyopathy: Secondary | ICD-10-CM | POA: Diagnosis not present

## 2022-08-31 DIAGNOSIS — E119 Type 2 diabetes mellitus without complications: Secondary | ICD-10-CM | POA: Diagnosis not present

## 2022-08-31 DIAGNOSIS — Z7901 Long term (current) use of anticoagulants: Secondary | ICD-10-CM | POA: Diagnosis not present

## 2022-08-31 DIAGNOSIS — I2510 Atherosclerotic heart disease of native coronary artery without angina pectoris: Secondary | ICD-10-CM | POA: Diagnosis not present

## 2022-08-31 DIAGNOSIS — G4733 Obstructive sleep apnea (adult) (pediatric): Secondary | ICD-10-CM | POA: Diagnosis not present

## 2022-08-31 DIAGNOSIS — Z5289 Donor of other specified organs or tissues: Secondary | ICD-10-CM | POA: Diagnosis not present

## 2022-08-31 DIAGNOSIS — I714 Abdominal aortic aneurysm, without rupture, unspecified: Secondary | ICD-10-CM | POA: Diagnosis not present

## 2022-08-31 DIAGNOSIS — I5022 Chronic systolic (congestive) heart failure: Secondary | ICD-10-CM | POA: Diagnosis not present

## 2022-09-02 ENCOUNTER — Telehealth: Payer: Self-pay

## 2022-09-02 ENCOUNTER — Ambulatory Visit (INDEPENDENT_AMBULATORY_CARE_PROVIDER_SITE_OTHER): Payer: Medicare Other

## 2022-09-02 DIAGNOSIS — Z7901 Long term (current) use of anticoagulants: Secondary | ICD-10-CM

## 2022-09-02 DIAGNOSIS — E119 Type 2 diabetes mellitus without complications: Secondary | ICD-10-CM | POA: Diagnosis not present

## 2022-09-02 DIAGNOSIS — Z7984 Long term (current) use of oral hypoglycemic drugs: Secondary | ICD-10-CM | POA: Diagnosis not present

## 2022-09-02 DIAGNOSIS — I4892 Unspecified atrial flutter: Secondary | ICD-10-CM | POA: Diagnosis not present

## 2022-09-02 DIAGNOSIS — E785 Hyperlipidemia, unspecified: Secondary | ICD-10-CM | POA: Diagnosis not present

## 2022-09-02 DIAGNOSIS — Z48815 Encounter for surgical aftercare following surgery on the digestive system: Secondary | ICD-10-CM | POA: Diagnosis not present

## 2022-09-02 DIAGNOSIS — K429 Umbilical hernia without obstruction or gangrene: Secondary | ICD-10-CM | POA: Diagnosis not present

## 2022-09-02 DIAGNOSIS — G4733 Obstructive sleep apnea (adult) (pediatric): Secondary | ICD-10-CM | POA: Diagnosis not present

## 2022-09-02 DIAGNOSIS — I480 Paroxysmal atrial fibrillation: Secondary | ICD-10-CM | POA: Diagnosis not present

## 2022-09-02 DIAGNOSIS — I714 Abdominal aortic aneurysm, without rupture, unspecified: Secondary | ICD-10-CM | POA: Diagnosis not present

## 2022-09-02 DIAGNOSIS — Z5289 Donor of other specified organs or tissues: Secondary | ICD-10-CM | POA: Diagnosis not present

## 2022-09-02 DIAGNOSIS — I420 Dilated cardiomyopathy: Secondary | ICD-10-CM | POA: Diagnosis not present

## 2022-09-02 DIAGNOSIS — I2510 Atherosclerotic heart disease of native coronary artery without angina pectoris: Secondary | ICD-10-CM | POA: Diagnosis not present

## 2022-09-02 DIAGNOSIS — I5022 Chronic systolic (congestive) heart failure: Secondary | ICD-10-CM | POA: Diagnosis not present

## 2022-09-02 DIAGNOSIS — Z9181 History of falling: Secondary | ICD-10-CM | POA: Diagnosis not present

## 2022-09-02 LAB — POCT INR: INR: 2.6 (ref 2.0–3.0)

## 2022-09-02 NOTE — Progress Notes (Signed)
Pt has HH nurse, [name], RN from [facility], and checked INR at home and was 2.6. [name]'s number is [phone] Stopped taking glimepiride, metolazone, and supplements, turmeric, saw palmetto, and superbeet. Pt reports PCP reported he could stop glimepiride and metolazone. Continue 1 tablet daily except take 1/2 tablet on Tuesday, Thursday and Saturday.  Recheck in 1 week.  Advised [name] and pt on phone of dosing changes and recheck in 1 week. Both verbalized understanding.

## 2022-09-02 NOTE — Progress Notes (Signed)
Care Management & Coordination Services Pharmacy Team  Reason for Encounter: Appointment Reminder  Contacted patient to confirm telephone appointment with Al Corpus, PharmD on 09/09/2022 at 10:00.  Unsuccessful outreach. Left voicemail for patient to return call.  Star Rating Drugs:  Medication:  Last Fill: Day Supply Glimepiride 4 mg 08/16/2022 90 Metformin 500 mg 07/28/2022 90 Simvastatin 20 mg 08/16/2022 90  Care Gaps: Annual wellness visit in last year? Yes 09/24/2021  If Diabetic: Last eye exam / retinopathy screening: Up to date Last diabetic foot exam: Overdue  Al Corpus, PharmD notified  Claudina Lick, [place] Clinical Pharmacy Assistant [phone]

## 2022-09-02 NOTE — Patient Instructions (Addendum)
Pre visit review using our clinic review tool, if applicable. No additional management support is needed unless otherwise documented below in the visit note.  Continue 1 tablet daily except take 1/2 tablet on Tuesday, Thursday and Saturday.  Recheck in 1 week.  

## 2022-09-03 ENCOUNTER — Encounter: Payer: Medicare Other | Admitting: Surgery

## 2022-09-07 ENCOUNTER — Ambulatory Visit
Admission: RE | Admit: 2022-09-07 | Discharge: 2022-09-07 | Disposition: A | Discharge status: Home / Self Care | Payer: Medicare Other | Source: Ambulatory Visit | Attending: Family Medicine | Admitting: Family Medicine

## 2022-09-07 DIAGNOSIS — Z122 Encounter for screening for malignant neoplasm of respiratory organs: Secondary | ICD-10-CM

## 2022-09-07 DIAGNOSIS — Z87891 Personal history of nicotine dependence: Secondary | ICD-10-CM

## 2022-09-08 ENCOUNTER — Ambulatory Visit (INDEPENDENT_AMBULATORY_CARE_PROVIDER_SITE_OTHER): Payer: Medicare Other | Admitting: Family Medicine

## 2022-09-08 ENCOUNTER — Encounter: Payer: Self-pay | Admitting: Family Medicine

## 2022-09-08 VITALS — BP 130/70 | HR 63 | Temp 97.9°F | Ht 71.0 in | Wt 218.2 lb

## 2022-09-08 DIAGNOSIS — Z9181 History of falling: Secondary | ICD-10-CM | POA: Diagnosis not present

## 2022-09-08 DIAGNOSIS — R21 Rash and other nonspecific skin eruption: Secondary | ICD-10-CM

## 2022-09-08 DIAGNOSIS — K429 Umbilical hernia without obstruction or gangrene: Secondary | ICD-10-CM | POA: Diagnosis not present

## 2022-09-08 DIAGNOSIS — G4733 Obstructive sleep apnea (adult) (pediatric): Secondary | ICD-10-CM | POA: Diagnosis not present

## 2022-09-08 DIAGNOSIS — I5022 Chronic systolic (congestive) heart failure: Secondary | ICD-10-CM | POA: Diagnosis not present

## 2022-09-08 DIAGNOSIS — E785 Hyperlipidemia, unspecified: Secondary | ICD-10-CM | POA: Diagnosis not present

## 2022-09-08 DIAGNOSIS — I2510 Atherosclerotic heart disease of native coronary artery without angina pectoris: Secondary | ICD-10-CM | POA: Diagnosis not present

## 2022-09-08 DIAGNOSIS — I480 Paroxysmal atrial fibrillation: Secondary | ICD-10-CM | POA: Diagnosis not present

## 2022-09-08 DIAGNOSIS — I420 Dilated cardiomyopathy: Secondary | ICD-10-CM | POA: Diagnosis not present

## 2022-09-08 DIAGNOSIS — K5900 Constipation, unspecified: Secondary | ICD-10-CM

## 2022-09-08 DIAGNOSIS — E1169 Type 2 diabetes mellitus with other specified complication: Secondary | ICD-10-CM | POA: Diagnosis not present

## 2022-09-08 DIAGNOSIS — E119 Type 2 diabetes mellitus without complications: Secondary | ICD-10-CM | POA: Diagnosis not present

## 2022-09-08 DIAGNOSIS — Z7984 Long term (current) use of oral hypoglycemic drugs: Secondary | ICD-10-CM | POA: Diagnosis not present

## 2022-09-08 DIAGNOSIS — Z7901 Long term (current) use of anticoagulants: Secondary | ICD-10-CM | POA: Diagnosis not present

## 2022-09-08 DIAGNOSIS — I4892 Unspecified atrial flutter: Secondary | ICD-10-CM | POA: Diagnosis not present

## 2022-09-08 DIAGNOSIS — Z48815 Encounter for surgical aftercare following surgery on the digestive system: Secondary | ICD-10-CM | POA: Diagnosis not present

## 2022-09-08 DIAGNOSIS — I714 Abdominal aortic aneurysm, without rupture, unspecified: Secondary | ICD-10-CM | POA: Diagnosis not present

## 2022-09-08 DIAGNOSIS — Z5289 Donor of other specified organs or tissues: Secondary | ICD-10-CM | POA: Diagnosis not present

## 2022-09-08 MED ORDER — POTASSIUM CHLORIDE ER 10 MEQ PO CPCR: 10.0000 meq | ORAL_CAPSULE | Freq: Every day | ORAL | 3 refills | Status: DC

## 2022-09-08 MED ORDER — PREDNISONE 20 MG PO TABS
ORAL_TABLET | ORAL | 0 refills | Status: DC
Start: 2022-09-08 — End: 2022-09-22

## 2022-09-08 NOTE — Patient Instructions (Addendum)
Possible poison ivy dermatitis - treat with over the counter Cortizone-10 twice daily to affected areas for 7-10 days as needed. May use hydroxyzine 25mg  1/2-1 tablet twice daily as needed for itch. Calomine lotion can also help.  If not better with this, fill prednisone taper printed out today. If you start oral prednisone, restart glimepiride while on prednisone.  Continue miralax as up to now if tolerating well.  Potassium capsule sent in.

## 2022-09-08 NOTE — Assessment & Plan Note (Signed)
Doing well off glimepiride with non-fasting cbg's staying 140s.  See above for antihyperglycemic plan if prednisone started.
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
## 2022-09-08 NOTE — Assessment & Plan Note (Addendum)
Actually seems to be doing better on higher dose miralax - 4-5 capfuls per day as needed, is tolerating this well.

## 2022-09-09 ENCOUNTER — Ambulatory Visit: Payer: Medicare Other | Admitting: Pharmacist

## 2022-09-09 ENCOUNTER — Ambulatory Visit (INDEPENDENT_AMBULATORY_CARE_PROVIDER_SITE_OTHER): Payer: Medicare Other

## 2022-09-09 DIAGNOSIS — Z7901 Long term (current) use of anticoagulants: Secondary | ICD-10-CM | POA: Diagnosis not present

## 2022-09-09 DIAGNOSIS — I4892 Unspecified atrial flutter: Secondary | ICD-10-CM | POA: Diagnosis not present

## 2022-09-09 DIAGNOSIS — Z7984 Long term (current) use of oral hypoglycemic drugs: Secondary | ICD-10-CM | POA: Diagnosis not present

## 2022-09-09 DIAGNOSIS — Z48815 Encounter for surgical aftercare following surgery on the digestive system: Secondary | ICD-10-CM | POA: Diagnosis not present

## 2022-09-09 DIAGNOSIS — I714 Abdominal aortic aneurysm, without rupture, unspecified: Secondary | ICD-10-CM | POA: Diagnosis not present

## 2022-09-09 DIAGNOSIS — I480 Paroxysmal atrial fibrillation: Secondary | ICD-10-CM | POA: Diagnosis not present

## 2022-09-09 DIAGNOSIS — Z5289 Donor of other specified organs or tissues: Secondary | ICD-10-CM | POA: Diagnosis not present

## 2022-09-09 DIAGNOSIS — I2510 Atherosclerotic heart disease of native coronary artery without angina pectoris: Secondary | ICD-10-CM | POA: Diagnosis not present

## 2022-09-09 DIAGNOSIS — E785 Hyperlipidemia, unspecified: Secondary | ICD-10-CM | POA: Diagnosis not present

## 2022-09-09 DIAGNOSIS — E119 Type 2 diabetes mellitus without complications: Secondary | ICD-10-CM | POA: Diagnosis not present

## 2022-09-09 DIAGNOSIS — Z9181 History of falling: Secondary | ICD-10-CM | POA: Diagnosis not present

## 2022-09-09 DIAGNOSIS — I420 Dilated cardiomyopathy: Secondary | ICD-10-CM | POA: Diagnosis not present

## 2022-09-09 DIAGNOSIS — K429 Umbilical hernia without obstruction or gangrene: Secondary | ICD-10-CM | POA: Diagnosis not present

## 2022-09-09 DIAGNOSIS — I5022 Chronic systolic (congestive) heart failure: Secondary | ICD-10-CM | POA: Diagnosis not present

## 2022-09-09 DIAGNOSIS — G4733 Obstructive sleep apnea (adult) (pediatric): Secondary | ICD-10-CM | POA: Diagnosis not present

## 2022-09-09 LAB — POCT INR: INR: 1.6 — AB (ref 2.0–3.0)

## 2022-09-09 NOTE — Progress Notes (Signed)
Pt has HH nurse, [name], RN from [facility], and checked INR at home and was 1.6. [name]'s number is [phone]. Pt was placed on a prednisone taper, 40 mg x 3 days, then 20 mg x 6 days for skin rash. Most likely the subtherapeutic INR is from the prednisone. Pt has been stable on current dosing for several INR checks. No changes in weekly dosing will be made. Will advised booster doses for the next two days.  Increase dose today to take 1 tablet and increase dose tomorrow to take 1 1/2 tablets and the continue 1 tablet daily except take 1/2 tablet on Tuesday, Thursday and Saturday.  Recheck in 1 week.  Advised [name] and pt on phone of dosing changes and recheck in 1 week. Both verbalized understanding.

## 2022-09-09 NOTE — Progress Notes (Signed)
Care Management & Coordination Services Pharmacy Note  09/09/2022 Name:  [name] MRN:  [id number] DOB:  10-12-1955  Summary: F/U visit -HF: pt reports weighing himself daily and wt is stable; he denies any swelling; he is no longer taking metolazone per PCP instructions (in s/o AKI, which has resolved according to most recent labwork) -Anemia: s/p blood transfusions 01/2022, 03/2022 and iron infusions x 4 (03/2022 - 06/2022); HGB 11.7, TSAT 13 (07/2022) improved from previous -DM: A1c 6.9% (07/2022) improved from >10% with lifestyle modifications and addition of Farxiga; of note he did start prednisone yesterday for poison ivy and has restarted glimepiride to account for steroid-induced hyperglycemia, he is checking BG 3-4 times per day currently -Constipation: discussed opioid contributing, he is no longer taking; he reports BM are regular with daily Miralax use  Recommendations/Changes made from today's visit: -No med changes -Continue multiple BG checks per day while taking prednisone; alert PCP if consistently elevated (>250) -Continue weighing daily; contact cardiology with weight gain 3+ lbs in 1 day, 5+ lbs in 1 week  Follow up plan: -CC RN 09/21/22 -Pharmacist follow up televisit scheduled for 1 month -hematology appt 10/22/22; Cardiology 11/09/22   Subjective: [name] is an 67 y.o. year old male who is a primary patient of [name] Boyden, MD.  The care coordination team was consulted for assistance with disease management and care coordination needs.    Engaged with patient by telephone for follow up visit. Patient lives at home alone with his dog. He does not have any family nearby, he has one friend who will occasionally help him.  Recent office visits: 09/08/22 Dr Sharen Hones OV: skin rash - poison ivy. Rec cortisone cream, hydroxyzine. Prednisone taper if needed -advised to restart glimepiride if taking prednisone. continue Miralax 4-5 capfuls/day. Update  potassium to capsules.  08/30/22 Dr Sharen Hones OV: constipation - improved w/ mag citrate. Increase fiber intake. Hold glimepiride (trial)  07/26/22 Dr Sharen Hones OV: surgical clearance - A1c 6.0%; GFR 46, reduce metformin to 500 mg BID and stop weekly metolazone. Push fluids, repeat Cr in 1 week  07/02/22 Dr Sharen Hones OV: skin rash - rx triamcinolone cream. Back pain - rx methocarbambol. Rec voltaren gel for elbow pain.  06/14/22 Dr Sharen Hones OV: f/u- pt cancelled capsule endoscopy - still unclear cause of anemia. Last iron infusion 1/18. He restarted warfarin. Declined further eval for OSA (CPAP or BiPAP). Refer to surgery for lower back mass.  05/03/22 Dr Sharen Hones OV: annual - Prevnar 20 given. Check about Shingrix and RSV. Repeat CBC in 2 weeks.  04/20/22 Dr Sharen Hones OV: ED f/u - tolerating fusion plus. Continue holding warfarin until GI workup complete.  04/03/23 Dr Sharen Hones OV: f/u - mobility assessment. Ordered power scooter. HGB 8.4. Advised to hold warfarin until GI eval - start aspirin in meantime. Continue pantoprazole BID.  03/30/23 Dr Sharen Hones OV: hospital f/u - 20# wt loss in past 6 weeks. F/u with pulm for sleep study and PFTs. Start oral iron MWF, may need iron infusion, f/u with heme. Drop metolazone to once weekly.  02/19/22 Dr Sharen Hones OV: call pulm for scheduling sLeep study, PFTs. Significant diuresis in hospital ~30 lbs, now 50# down. Anxiety worsening - change to fluoxetine 40 mg. Rx written for portable oxygen concentrator. Referred for eye exam. Stop sea moss, super beet, turmeric. HGB 7.1 - advised ER evaluation for blood transfusion.   Recent consult visits: 08/27/22 Dr Aleen Campi (Gen Surg): constipation - paused iron pills. Try Fleet's enema and mag citrate.  08/13/22 Dr Aleen Campi (Gen Surg): f/u hernia surgery - drain removed.  07/28/22 Dr Madaline Guthrie (Cardiology): Afib, HF stable. Advised to use CPAP more consitently.  07/14/22 Dr Aleen Campi (Gen Surg): new pt - umbilical hernia +  sebaceous cysts. Schedule surgery 4/11  05/21/22 NP [facility] (Cardiology): HF - wt down 8 lbs from 5 weeks ago. No changes. 04/23/22 Dr Smith Robert (Hematology): IDA - needs IV iron.  04/23/22 PA Madaline Guthrie [facility] Cardiology): f/u - no changes. 04/09/22 NP [facility] (Cardiology): f/u - continue weighing daily. Clear out voicemail. 04/08/22 Dr Allegra Lai (GI): anemia - schedule EGD, colonoscopy. Trial Fusion iron pill. 02/25/22 NP Clarisa Kindred (Cardiology): HF - Increase Farxiga to 10 mg. Updated Kcl to extra 1 tablet with metolazone.  02/15/22 Dr Gwen Pounds (Cardiology): decrease amiodarone to 200 mg 01/18/22 Dr Aundria Rud [facility]): consult SOB. Order PFT and split night study to confirm OSA. Initiate BiPAP. RTC 3 months.  Hospital visits: 08/05/22 planned admission [facility]): hernia repair  04/09/22 ED visit [facility]): GI hemorrhage, anemia. Recommend admission with positive hemoccult. Pt unable to stay as no one can watch his dog. Given 2 u PRBC and PPI and pt signed AMA form.  02/23/22 ED visit [facility]): symptomatic anemia - blood transfusion.   01/03/22 - 01/05/22 Admission [facility]): Acute CHF, sepsis (cellulitis of b/l LE), COPD exacerbation.  Discharged on Bactrim, prednisone. Reduce carvedilol d/t soft BP. D/C on furosemide, not torsemide. Consider ACE/ARB - outpatient. D/c with home health.   12/28/21 ED visit [facility]): chest wall pain, multiple falls, CHF exacerbation. CT head negative. IV lasix given.   Objective:  Lab Results  Component Value Date   CREATININE 1.10 08/05/2022   BUN 15 08/05/2022   GFR 46.60 (L) 07/26/2022   GFRNONAA >60 04/09/2022   GFRAA >60 12/22/2018   NA 139 08/05/2022   K 3.3 (L) 08/05/2022   CALCIUM 9.9 07/26/2022   CO2 36 (H) 07/26/2022   GLUCOSE 156 (H) 08/05/2022    Lab Results  Component Value Date/Time   HGBA1C 6.0 07/26/2022 12:04 PM   HGBA1C 6.9 (H) 04/20/2022 12:36 PM   HGBA1C 7.0 (H) 01/24/2013 04:07 AM   FRUCTOSAMINE 237 07/26/2022 12:04 PM   GFR 46.60 (L) 07/26/2022  12:04 PM   GFR 66.26 05/03/2022 04:04 PM   MICROALBUR 5.4 (H) 07/15/2021 09:12 AM   MICROALBUR <0.7 07/09/2020 08:22 AM    Last diabetic Eye exam:  Lab Results  Component Value Date/Time   HMDIABEYEEXA No Retinopathy 06/17/2020 12:00 AM    Last diabetic Foot exam: No results found for: "HMDIABFOOTEX"   Lab Results  Component Value Date   CHOL 104 01/04/2022   HDL 42 01/04/2022   LDLCALC 50 01/04/2022   LDLDIRECT 97.0 07/09/2020   TRIG 59 01/04/2022   CHOLHDL 2.5 01/04/2022       Latest Ref Rng & Units 07/26/2022   12:04 PM 02/22/2022    4:15 PM 01/12/2022   10:27 AM  Hepatic Function  Total Protein 6.0 - 8.3 g/dL 7.2  6.9  6.8   Albumin 3.5 - 5.2 g/dL 4.3  3.7  3.8   AST 0 - 37 U/L 20  26  28    ALT 0 - 53 U/L 13  17  32   Alk Phosphatase 39 - 117 U/L 59  54  109   Total Bilirubin 0.2 - 1.2 mg/dL 0.3  0.7  0.6     Lab Results  Component Value Date/Time   TSH 2.81 07/15/2021 09:12 AM   TSH 2.63 10/02/2019 12:05 PM  Latest Ref Rng & Units 08/23/2022   11:17 AM 08/05/2022   10:33 AM 07/26/2022   12:04 PM  CBC  WBC 4.0 - 10.5 K/uL 10.3   10.2   Hemoglobin 13.0 - 17.0 g/dL 16.1  09.6  04.5   Hematocrit 39.0 - 52.0 % 37.1  40.0  37.1   Platelets 150 - 400 K/uL 319   290.0    Iron/TIBC/Ferritin/ %Sat    Component Value Date/Time   IRON 43 (L) 08/23/2022 1117   TIBC 337 08/23/2022 1117   FERRITIN 46 08/23/2022 1117   IRONPCTSAT 13 (L) 08/23/2022 1117   Lab Results  Component Value Date/Time   INR 2.6 09/02/2022 12:00 AM   INR 2.8 08/26/2022 12:00 AM   INR 1.1 08/05/2022 11:08 AM   INR 1.0 04/27/2022 09:02 AM   INR 1.8 08/27/2011 12:12 PM    Lab Results  Component Value Date/Time   VD25OH 44.82 07/15/2021 09:12 AM   VD25OH 29.46 (L) 07/09/2020 07:57 AM   VITAMINB12 699 02/23/2022 06:13 AM   VITAMINB12 >1504 (H) 07/15/2021 09:12 AM    Clinical ASCVD: Yes  The ASCVD Risk score (Arnett DK, et al., 2019) failed to calculate for the following reasons:    The patient has a prior MI or stroke diagnosis    CHA2DS2/VAS Stroke Risk Points  Current as of 7 hours ago     5 >= 2 Points: High Risk        07/02/2022   11:53 AM 05/03/2022    3:23 PM 01/15/2022   12:10 PM  Depression screen PHQ 2/9  Decreased Interest 0 0 0  Down, Depressed, Hopeless 0 0 0  PHQ - 2 Score 0 0 0  Altered sleeping 3    Tired, decreased energy 3    Change in appetite 1    Feeling bad or failure about yourself  0    Trouble concentrating 0    Moving slowly or fidgety/restless 0    Suicidal thoughts 0    PHQ-9 Score 7    Difficult doing work/chores Somewhat difficult         07/02/2022   11:53 AM 01/15/2022   12:10 PM 09/08/2021   10:46 AM 07/20/2021    4:51 PM  GAD 7 : Generalized Anxiety Score  Nervous, Anxious, on Edge 1 2 3 2   Control/stop worrying 0 2 1 1   Worry too much - different things 1 2 1 1   Trouble relaxing 0 2 0 1  Restless 0 0 0 0  Easily annoyed or irritable 0 0 0 0  Afraid - awful might happen 0 0 0 0  Total GAD 7 Score 2 8 5 5   Anxiety Difficulty Somewhat difficult Somewhat difficult Somewhat difficult     Social History   Tobacco Use  Smoking Status Former   Packs/day: 1.00   Years: 31.00   Additional pack years: 0.00   Total pack years: 31.00   Types: Cigarettes   Quit date: 04/26/2018   Years since quitting: 4.3   Passive exposure: Past  Smokeless Tobacco Never   BP Readings from Last 3 Encounters:  09/08/22 130/70  08/30/22 134/68  08/27/22 (!) 142/84   Pulse Readings from Last 3 Encounters:  09/08/22 63  08/30/22 (!) 59  08/27/22 82   Wt Readings from Last 3 Encounters:  09/08/22 218 lb 4 oz (99 kg)  09/07/22 211 lb (95.7 kg)  08/30/22 224 lb 8 oz (101.8 kg)   BMI Readings from Last  3 Encounters:  09/08/22 30.44 kg/m  09/07/22 29.43 kg/m  08/30/22 31.31 kg/m    Allergies  Allergen Reactions   Atorvastatin Other (See Comments)    Dizziness   Lisinopril Cough    Medications Reviewed Today     Reviewed  by Kathyrn Sheriff, [facility] (Pharmacist) on 09/09/22 at 1034  Med List Status: <None>   Medication Order Taking? Sig Documenting Provider Last Dose Status Informant  ACCU-CHEK FASTCLIX LANCETS MISC [id number] Yes Check blood sugar once daily and as instructed. Dx 250.00 [name] Boyden, MD Taking Active Self  acetaminophen (TYLENOL) 500 MG tablet [id number] Yes Take 2 tablets (1,000 mg total) by mouth every 6 (six) hours as needed for mild pain. Henrene Dodge, MD Taking Active   albuterol (VENTOLIN HFA) 108 (90 Base) MCG/ACT inhaler [id number] Yes Inhale 2 puffs into the lungs every 6 (six) hours as needed for wheezing or shortness of breath. [name] Boyden, MD Taking Active Self  amiodarone (PACERONE) 200 MG tablet [id number] Yes Take 1 tablet (200 mg total) by mouth daily. Lanier Prude, MD Taking Active Self  Blood Glucose Monitoring Suppl (BLOOD GLUCOSE MONITOR SYSTEM) W/DEVICE KIT [id number] Yes by Does not apply route. Use to check sugar once daily and as needed Dx: E11.9 **ONE TOUCH VERIO** [provider] Taking Active Self  Budeson-Glycopyrrol-Formoterol (BREZTRI AEROSPHERE) 160-9-4.8 MCG/ACT AERO [id number] Yes Inhale 2 puffs into the lungs 2 (two) times daily. [name] Boyden, MD Taking Active Self           Med Note Kathyrn Sheriff   Tue Jul 13, 2022 11:21 AM) AZ&ME PAP  carvedilol (COREG) 6.25 MG tablet [id number] Yes TAKE 1 TABLET BY MOUTH TWICE A DAY WITH FOOD [name] Boyden, MD Taking Active   Cholecalciferol (VITAMIN D) 50 MCG (2000 UT) CAPS [id number] Yes Take 1 capsule (2,000 Units total) by mouth daily. [name] Boyden, MD Taking Active Self  dapagliflozin propanediol (FARXIGA) 10 MG TABS tablet [id number] Yes Take 1 tablet (10 mg total) by mouth daily before breakfast. Delma Freeze, FNP Taking Active            Med Note Kathyrn Sheriff   Tue Jul 13, 2022 11:21 AM) AZ&Me PAP  Docusate Calcium (STOOL SOFTENER PO) [id number] Yes Take 1 tablet by mouth  daily. [provider] Taking Active Self  Fe Fum-Fe Poly-Vit C-Lactobac (FUSION PO) [id number] Yes Take 1 tablet by mouth every other day. Patient states received sample.  Unsure of mg. [provider] Taking Active Self  FLUoxetine (PROZAC) 40 MG capsule [id number] Yes Take 1 capsule (40 mg total) by mouth daily. [name] Boyden, MD Taking Active Self  fluticasone [facility]) 50 MCG/ACT nasal spray [id number] Yes Place 2 sprays into both nostrils daily.  Patient taking differently: Place 2 sprays into both nostrils as needed.   [name] Boyden, MD Taking Active Self           Med Note Nelia Shi   Fri Apr 09, 2022  1:23 PM)    furosemide (LASIX) 80 MG tablet [id number] Yes TAKE 1 TABLET BY MOUTH 2 TIMES DAILY. [name] Boyden, MD Taking Active   glucose blood [facility] VERIO) test strip [id number] Yes Check blood sugar 3 times a day [name] Boyden, MD Taking Active Self  ipratropium-albuterol (DUONEB) 0.5-2.5 (3) MG/3ML SOLN [id number] Yes TAKE 3 MLS BY NEBULIZATION EVERY 6 (SIX) HOURS AS NEEDED (SEVERE SHORTNESS OF BREATH/WHEEZING). [name] Boyden, MD Taking Active   metFORMIN (GLUCOPHAGE) 500 MG tablet [id number] Yes Take 1 tablet (  500 mg total) by mouth 2 (two) times daily with a meal. [name] Boyden, MD Taking Active   methocarbamol (ROBAXIN) 500 MG tablet [id number] Yes Take 1 tablet (500 mg total) by mouth 3 (three) times daily as needed for muscle spasms (sedation precautions). [name] Boyden, MD Taking Active   montelukast (SINGULAIR) 10 MG tablet [id number] Yes TAKE 1 TABLET BY MOUTH EVERYDAY AT BEDTIME [name] Boyden, MD Taking Active   ondansetron [facility]) 4 MG tablet [id number] Yes Take 1 tablet (4 mg total) by mouth daily as needed for nausea or vomiting. Henrene Dodge, MD Taking Active   OVER THE COUNTER MEDICATION [id number] Yes Take 1 tablet by mouth daily. Superbeets [provider] Taking Active            Med Note RONTAVIUS, BORROEL    Fri May 21, 2022 11:52 AM)    OVER THE COUNTER MEDICATION [id number] Yes Take 1 capsule by mouth daily at 6 (six) AM. [place] sea moss [provider] Taking Active   OXYGEN [id number] Yes Inhale 2 L into the lungs at bedtime. [provider] Taking Active   pantoprazole (PROTONIX) 40 MG tablet [id number] Yes TAKE 1 TABLET (40 MG TOTAL) BY MOUTH TWICE A DAY BEFORE MEALS [name] Boyden, MD Taking Active   potassium chloride (MICRO-K) 10 MEQ CR capsule [id number] Yes Take 1 capsule (10 mEq total) by mouth daily. [name] Boyden, MD Taking Active   predniSONE (DELTASONE) 20 MG tablet [id number] Yes Take two tablets daily for 3 days followed by one tablet daily for 6 days [name] Boyden, MD Taking Active   Probiotic Product (PROBIOTIC DAILY PO) [id number] Yes Take 1 tablet by mouth daily. [provider] Taking Active Self  Saw Palmetto 450 MG CAPS [id number] Yes Take 1 capsule (450 mg total) by mouth daily. [name] Boyden, MD Taking Active   simvastatin (ZOCOR) 20 MG tablet [id number] Yes TAKE 1 TABLET BY MOUTH EVERY DAY IN THE Onalee Hua, MD Taking Active   Turmeric (QC TUMERIC COMPLEX PO) [id number] Yes Take 1 tablet by mouth daily at 6 (six) AM. [provider] Taking Active   vitamin B-12 (CYANOCOBALAMIN) 1000 MCG tablet [id number] Yes Take 1 tablet (1,000 mcg total) by mouth every Monday, Wednesday, and Friday. [name] Boyden, MD Taking Active Self  warfarin (COUMADIN) 5 MG tablet [id number] Yes TAKE 1/2 TABLET BY MOUTH DAILY EXCEPT TAKE 1 TABLET ON MONDAYS, WEDNESDAYS AND FRIDAYS OR AS DIRECTED BY ANTICOAGULATION CLINIC  Patient taking differently: Take 2.5-5 mg by mouth daily. TAKE 1/2 TABLET BY MOUTH DAILY EXCEPT TAKE 1 TABLET ON MONDAYS, WEDNESDAYS AND FRIDAYS OR AS DIRECTED BY ANTICOAGULATION CLINIC   [name] Boyden, MD Taking Active   Med List Note Brigitte Pulse, RN 01/05/18 1237): UDS 01/05/18            SDOH:  (Social  Determinants of Health) assessments and interventions performed: No SDOH Interventions    Flowsheet Row Telephone from 08/13/2022 in [facility] Coordination Telephone from 03/05/2022 in [facility] Care Coordination Chronic Care Management from 10/20/2021 in [facility] Visit from 07/20/2021 in [facility] Clinical Support from 07/09/2020 in [facility]  SDOH Interventions       Food Insecurity Interventions Intervention Not Indicated Intervention Not Indicated -- -- --  Housing Interventions Intervention Not Indicated Intervention Not Indicated -- -- --  Transportation Interventions Other (Comment)  [referral to  community resource guide for transportation assistance.] Other (Comment)  [patient was referred to [employer] guide 01/07/22. He was contacted and provided transportation resource information.] -- -- --  Depression Interventions/Treatment  -- -- -- Medication PHQ2-9 Score <4 Follow-up Not Indicated  Financial Strain Interventions -- -- Other (Comment)  [AZ&Me PAP] -- --       Medication Assistance:  Bernadene Bell- AZ&Me approved 2024  Medication Access: Within the past 30 days, how often has patient missed a dose of medication? 0 Is a pillbox or other method used to improve adherence? Yes  Factors that may affect medication adherence? financial need Are meds synced by current pharmacy? No  Are meds delivered by current pharmacy? No  Does patient experience delays in picking up medications due to transportation concerns? No   [facility] Services Reviewed: Is patient disadvantaged to use [facility]?: No  Current Rx insurance plan: [facility] Name and location of Current pharmacy:  [facility] [phone] [place] - [address] [place] [address] Phone: [phone] Fax:  [phone]  [facility] - [place], [place] - [address] [place] [address] Phone: [phone] Fax: [phone]  [facility] services reviewed with patient today?: No  Patient requests to transfer care to [facility] Pharmacy?: No  Reason patient declined to change pharmacies: Not mentioned at this visit  Compliance/Adherence/Medication fill history: Care Gaps: Foot exam (due 05/2017) UACR (due 06/2022)  Star-Rating Drugs: Metformin - PDC 100% Simvastatin - PDC 100% Farxiga - PAP   ASSESSMENT / PLAN  Hypertension / Heart Failure (BP goal <130/80) -Controlled - pt reports no issues with swelling - home weights are stable, home BP is not known; he reports ongoing dizziness, "feeling woozy" -Last ejection fraction: 60-65% (Date: 10/2021) -HF type: HFpEF (EF > 50%); NYHA Class: II/III -AHA HF Stage: C (Heart disease and symptoms present) -Follows with cardiology (Dr Gwen Pounds, Dr Lalla Brothers) and HF clinic (NP Clarisa Kindred) -Daily weights this week: 233 lbs -Current treatment: Carvedilol 6.25 mg BID - Appropriate, Effective, Safe, Accessible Furosemide 80 mg BID - Appropriate, Effective, Safe, Accessible Potassium chloride 10 mEq daily  - Appropriate, Effective, Safe, Accessible Farxiga 10 mg daily - Appropriate, Effective, Safe, Accessible -Medications previously tried: losartan, metoprolol, torsemide, metolazone -Reviewed importance of weighing daily to monitor fluid status; advised to contact cardiology with wt gain of 3+ lbs in 1 day or 5+ lbs in a week -Recommended to continue current medication  Atrial Fibrillation (Goal: prevent stroke and major bleeding) -Controlled -Follows with cardiology (Dr Gwen Pounds, Dr Lalla Brothers) -Pt switched Eliquis to warfarin Fall 2023 due to cost -Dx 04/2021; Cardioversion 07/2021; CHADSVASC: 5 -Previous ? GI bleed 03/2022 (positive hemoccult, subsequent colonoscopy and EGD were normal, except for 10 precancerous polyps  removed, pt declined capsule study so no source of bleeding was identified) -Current treatment: Carvedilol 6.25 mg BID - Appropriate, Effective, Safe, Accessible Amiodarone 200 mg daily -Appropriate, Effective, Safe, Accessible Warfarin 5 mg - 1/2 tab daily except 1 tab MWF -Appropriate, Effective, Query Safe -Medications previously tried: Eliquis (cost), Multaq (HF) -Reviewed long term safety of warfarin - pt has struggled with getting INR in therapeutic range, with frequent fluid overload in s/o of acute HF; pt would probably do better with DOAC but could not afford Eliquis in donut hole -Recommended to continue current medication; re-evaluate possibility of DOAC in 2024  Iron deficiency Anemia  -Follows with GI, hematology. Workup for cause of GI bleeding incomplete, no etiology found (  colonoscopy, EGD unremarkable and pt declined capsule study) -Hx: s/p 2u PRBC transfusion 01/2022 and 03/2022, started iron infusion 03/2022 (last 07/12/22) -Current treatment  Vitamin B12 1000 mcg MWF - Appropriate, Effective, Safe, Accessible Fusion plus - on hold (constipation) Iron infusions (04/23/22, 05/13/22, 07/05/22, 07/12/22) -Medications previously tried: oral iron (GI upset)  -Reviewed role of oral iron when he is received iron infusions is unclear -Discussed recent labwork (08/23/22 - HGB 11.7, TSAT 13) demonstrated improvement from previous -Advised to follow up with Hematology as scheduled  Constipation (Goal: regular BM 1-3 days) -Controlled - pt reports improved BM since taking Miralax 4-5 capfuls daily; he keeps mag citrate around in case of severe/prolonged constipation -Current treatment  Miralax 4-5 capfuls/day - Appropriate, Effective, Safe, Accessible Milk of magnesia PRN - Appropriate, Effective, Safe, Accessible Magnesium citrate PRN -Appropriate, Effective, Safe, Accessible Docusate 100 mg Prn - Appropriate, Effective, Safe, Accessible Probiotic -Appropriate, Effective, Safe,  Accessible -Medications previously tried: n/a  -Reviewed opioid impact on constipation following surgery, he is no longer using opiod fo -Recommended to continue current medication  Diabetes (A1c goal <7%) -Controlled - A1c 6.0% (07/2022); of note he did start prednisone yesterday (for poison ivy) and restarted glimepiride this morning to account for steroid-induced hyperglycemia -pt reports he "changed his life" not just diet over the past 6 months, he has cut out high carb items and eats smaller portions, he has lost over 80 lbs since last summer 2023 (though some of this was volume overload in s/o HF exacerbation) -Current home glucose readings: 140s -Denies hypoglycemic/hyperglycemic symptoms -Pt does not sleep well, breakfast is sometimes 2pm -Current medications: Metformin 500 mg BID - Appropriate, Effective, Safe, Accessible Glimepiride 4 mg daily - on hold Farxiga 10 mg daily (PAP)- Appropriate, Effective, Safe, Accessible One Touch Verio supplies -Medications previously tried: [place] (cost), Januvia  -Educated on A1c and blood sugar goals; Complications of diabetes including kidney damage, retinal damage, and cardiovascular disease; Benefits of routine self-monitoring of blood sugar; -Recommended to continue current medication  COPD (Goal: control symptoms and prevent exacerbations) -Controlled - pt reports Markus Daft works as well as previous combination of Incruse + Wixela -Re-established with pulmonology 12/2021 - ordered PFTs and sleep study and started BiPAP in interim; pt has declined to reschedule sleep study -Gold Grade: Gold 3 (FEV1 30-49%) -Current COPD Classification:  E (exacerbation leading to hospitalization OR 2+ moderate exacerbations) -Pulmonary function testing: 06/2022 - FEV1 41% pred; FEV1/FVC 0.58 - moderate severe obstruction -Exacerbations requiring treatment in last 6 months: 0 (last 12/2021) -Current treatment  Breztri 160-9-4.8 mcg/act 2 puff BID (PAP)-  Appropriate, Effective, Safe, Accessible Atrovent (ipratropium) 2 puffs PRN - Appropriate, Effective, Safe, Accessible Albuterol HFA -Appropriate, Effective, Safe, Accessible Albuterol neb -Appropriate, Effective, Safe, Accessible Montelukast 10 mg daily HS -Appropriate, Effective, Safe, Accessible Flonase nasal spray -Appropriate, Effective, Safe, Accessible Oxygen -Medications previously tried: Trelegy, Advair, Spiriva -Patient reports consistent use of maintenance inhaler -Counseled on Proper inhaler technique; Benefits of consistent maintenance inhaler use -Recommend to continue current medication  Hyperlipidemia / CAD (LDL goal < 70) -Controlled - LDL 50 (12/2021) at goal -Hx CAD (3v disease on CT scan); also AAA; no aspirin due to warfarin -Current treatment: Simvastatin 20 mg daily HS - Appropriate, Effective, Safe, Accessible -Medications previously tried: n/a  -Educated on Cholesterol goals; Benefits of statin for ASCVD risk reduction; -Drug interaction (Category D) interaction with amiodarone - can increase concentrations of simvastatin; this is reasonable given relatively low dose of simvastatin and lack of side effects  reported by patient -Recommended to continue current medication  Depression/Anxiety (Goal: manage symptoms) -Improved - recently switched from citalopram to fluoxetine 01/2022, pt reports feeling a little better  -Self medicating with marijuana - has switched from smoking to edibles recently -Reports more panic attacks lately, improved somewhat since switch to fluoxetine -PHQ9: 7 (06/2022) - mild depression -GAD7: 2 (06/2022) - minimal anxiety; hx panic attacks -Connected with PCP for mental health support -Current treatment: Fluoxetine 40 mg daily - Appropriate, Query Effective -Medications previously tried/failed: citalopram, hydroxyzine (ineffective) -Educated on Benefits of medication for symptom control -Recommended to continue current  medication  OSA/Insomnia -Uncontrolled  -Hx OSA on CPAP; he is noncompliant with CPAP due to uncomfortable mask, he has not tried new mask in a few years;  -Pt has declined further workup for OSA/CPAP/BIPAP (declined to reschedule sleep study)  Other: -[facility] ended a few weeks ago. Pt reports PT was incredibly helpful. He says he has continued some exercises after HH ended -Pt reports ongoing dizziness, "feeling woozy", occurs all the time, he does not drive due to this -Pt reports eyesight concerns - feels like he is in a "fish bowl" and this effects his balance as well; he reports he has been to an eye doctor and they did not find anything; he is interested in getting a brain scan done, he plans to speak to PCP about this at next OV   Al Corpus, PharmD, [employer] Clinical Pharmacist [employer] [facility] [place] [phone]

## 2022-09-09 NOTE — Patient Instructions (Addendum)
Pre visit review using our clinic review tool, if applicable. No additional management support is needed unless otherwise documented below in the visit note.  Increase dose today to take 1 tablet and increase dose tomorrow to take 1 1/2 tablets and the continue 1 tablet daily except take 1/2 tablet on Tuesday, Thursday and Saturday.  Recheck in 1 week.

## 2022-09-10 NOTE — Progress Notes (Signed)
Agree. Thanks

## 2022-09-12 ENCOUNTER — Other Ambulatory Visit: Payer: Self-pay | Admitting: Acute Care

## 2022-09-12 DIAGNOSIS — Z122 Encounter for screening for malignant neoplasm of respiratory organs: Secondary | ICD-10-CM

## 2022-09-12 DIAGNOSIS — Z87891 Personal history of nicotine dependence: Secondary | ICD-10-CM

## 2022-09-14 ENCOUNTER — Other Ambulatory Visit: Payer: Self-pay | Admitting: Family Medicine

## 2022-09-14 DIAGNOSIS — F410 Panic disorder [episodic paroxysmal anxiety] without agoraphobia: Secondary | ICD-10-CM

## 2022-09-14 DIAGNOSIS — J9601 Acute respiratory failure with hypoxia: Secondary | ICD-10-CM | POA: Diagnosis not present

## 2022-09-14 DIAGNOSIS — G4733 Obstructive sleep apnea (adult) (pediatric): Secondary | ICD-10-CM | POA: Diagnosis not present

## 2022-09-16 ENCOUNTER — Ambulatory Visit (INDEPENDENT_AMBULATORY_CARE_PROVIDER_SITE_OTHER): Payer: Medicare Other

## 2022-09-16 DIAGNOSIS — Z5289 Donor of other specified organs or tissues: Secondary | ICD-10-CM | POA: Diagnosis not present

## 2022-09-16 DIAGNOSIS — Z7984 Long term (current) use of oral hypoglycemic drugs: Secondary | ICD-10-CM | POA: Diagnosis not present

## 2022-09-16 DIAGNOSIS — Z9181 History of falling: Secondary | ICD-10-CM | POA: Diagnosis not present

## 2022-09-16 DIAGNOSIS — K429 Umbilical hernia without obstruction or gangrene: Secondary | ICD-10-CM | POA: Diagnosis not present

## 2022-09-16 DIAGNOSIS — Z48815 Encounter for surgical aftercare following surgery on the digestive system: Secondary | ICD-10-CM | POA: Diagnosis not present

## 2022-09-16 DIAGNOSIS — E785 Hyperlipidemia, unspecified: Secondary | ICD-10-CM | POA: Diagnosis not present

## 2022-09-16 DIAGNOSIS — I5022 Chronic systolic (congestive) heart failure: Secondary | ICD-10-CM | POA: Diagnosis not present

## 2022-09-16 DIAGNOSIS — I714 Abdominal aortic aneurysm, without rupture, unspecified: Secondary | ICD-10-CM | POA: Diagnosis not present

## 2022-09-16 DIAGNOSIS — I420 Dilated cardiomyopathy: Secondary | ICD-10-CM | POA: Diagnosis not present

## 2022-09-16 DIAGNOSIS — G4733 Obstructive sleep apnea (adult) (pediatric): Secondary | ICD-10-CM | POA: Diagnosis not present

## 2022-09-16 DIAGNOSIS — I480 Paroxysmal atrial fibrillation: Secondary | ICD-10-CM | POA: Diagnosis not present

## 2022-09-16 DIAGNOSIS — E119 Type 2 diabetes mellitus without complications: Secondary | ICD-10-CM | POA: Diagnosis not present

## 2022-09-16 DIAGNOSIS — I4892 Unspecified atrial flutter: Secondary | ICD-10-CM | POA: Diagnosis not present

## 2022-09-16 DIAGNOSIS — Z7901 Long term (current) use of anticoagulants: Secondary | ICD-10-CM

## 2022-09-16 DIAGNOSIS — I2510 Atherosclerotic heart disease of native coronary artery without angina pectoris: Secondary | ICD-10-CM | POA: Diagnosis not present

## 2022-09-16 LAB — POCT INR: INR: 1.4 — AB (ref 2.0–3.0)

## 2022-09-16 NOTE — Progress Notes (Signed)
Pt has HH nurse, [name], RN from [facility], and checked INR at home and was 1.4. [name]'s number is [phone]. Pt finished a prednisone taper yesterday, 40 mg x 3 days, then 20 mg x 6 days for skin rash. Most likely the subtherapeutic INR is from the prednisone. Pt has been stable on current dosing for several INR checks. No changes in weekly dosing will be made. Will advised booster doses for the next two days.  Increase dose today to take 1 tablet and increase dose tomorrow to take 1 1/2 tablets and the continue 1 tablet daily except take 1/2 tablet on Tuesday and Saturday.  Recheck in 2 week.  Advised [name] and pt on phone of dosing changes and recheck in 2 week. Both verbalized understanding.

## 2022-09-16 NOTE — Patient Instructions (Addendum)
Pre visit review using our clinic review tool, if applicable. No additional management support is needed unless otherwise documented below in the visit note.  Increase dose today to take 1 tablet and increase dose tomorrow to take 1 1/2 tablets and the continue 1 tablet daily except take 1/2 tablet on Tuesday and Saturday. Recheck in 2 week.

## 2022-09-17 DIAGNOSIS — Z9181 History of falling: Secondary | ICD-10-CM | POA: Diagnosis not present

## 2022-09-17 DIAGNOSIS — E119 Type 2 diabetes mellitus without complications: Secondary | ICD-10-CM | POA: Diagnosis not present

## 2022-09-17 DIAGNOSIS — K429 Umbilical hernia without obstruction or gangrene: Secondary | ICD-10-CM | POA: Diagnosis not present

## 2022-09-17 DIAGNOSIS — I5022 Chronic systolic (congestive) heart failure: Secondary | ICD-10-CM | POA: Diagnosis not present

## 2022-09-17 DIAGNOSIS — I420 Dilated cardiomyopathy: Secondary | ICD-10-CM | POA: Diagnosis not present

## 2022-09-17 DIAGNOSIS — Z5289 Donor of other specified organs or tissues: Secondary | ICD-10-CM | POA: Diagnosis not present

## 2022-09-17 DIAGNOSIS — Z7984 Long term (current) use of oral hypoglycemic drugs: Secondary | ICD-10-CM | POA: Diagnosis not present

## 2022-09-17 DIAGNOSIS — I714 Abdominal aortic aneurysm, without rupture, unspecified: Secondary | ICD-10-CM | POA: Diagnosis not present

## 2022-09-17 DIAGNOSIS — I4892 Unspecified atrial flutter: Secondary | ICD-10-CM | POA: Diagnosis not present

## 2022-09-17 DIAGNOSIS — Z48815 Encounter for surgical aftercare following surgery on the digestive system: Secondary | ICD-10-CM | POA: Diagnosis not present

## 2022-09-17 DIAGNOSIS — I480 Paroxysmal atrial fibrillation: Secondary | ICD-10-CM | POA: Diagnosis not present

## 2022-09-17 DIAGNOSIS — Z7901 Long term (current) use of anticoagulants: Secondary | ICD-10-CM | POA: Diagnosis not present

## 2022-09-17 DIAGNOSIS — E785 Hyperlipidemia, unspecified: Secondary | ICD-10-CM | POA: Diagnosis not present

## 2022-09-17 DIAGNOSIS — G4733 Obstructive sleep apnea (adult) (pediatric): Secondary | ICD-10-CM | POA: Diagnosis not present

## 2022-09-17 DIAGNOSIS — I2510 Atherosclerotic heart disease of native coronary artery without angina pectoris: Secondary | ICD-10-CM | POA: Diagnosis not present

## 2022-09-21 ENCOUNTER — Ambulatory Visit: Payer: Self-pay

## 2022-09-21 NOTE — Patient Outreach (Unsigned)
  Care Coordination   Follow Up Visit Note   09/21/2022 Name: [name] MRN: [id number] DOB: [birth year]  [name] Noa Sellen is a 67 y.o. year old male who sees Eustaquio Boyden, MD for primary care. I spoke with  [name] by phone today.  What matters to the patients health and wellness today?  ***    Goals Addressed   None     SDOH assessments and interventions completed:  No{THN Tip this will not be part of the note when signed-REQUIRED REPORT FIELD DO NOT DELETE (Optional):27901}     Care Coordination Interventions:  Yes, provided {THN Tip this will not be part of the note when signed-REQUIRED REPORT FIELD DO NOT DELETE (Optional):27901}  Follow up plan: Follow up call scheduled for 10/26/22    Encounter Outcome:  Pt. Visit Completed {THN Tip this will not be part of the note when signed-REQUIRED REPORT FIELD DO NOT DELETE (Optional):27901}  George Ina RN,BSN,CCM [facility] Care Coordination [phone] direct line

## 2022-09-22 ENCOUNTER — Telehealth: Payer: Self-pay | Admitting: Family Medicine

## 2022-09-22 DIAGNOSIS — Z7901 Long term (current) use of anticoagulants: Secondary | ICD-10-CM | POA: Diagnosis not present

## 2022-09-22 DIAGNOSIS — Z48815 Encounter for surgical aftercare following surgery on the digestive system: Secondary | ICD-10-CM | POA: Diagnosis not present

## 2022-09-22 DIAGNOSIS — Z9181 History of falling: Secondary | ICD-10-CM | POA: Diagnosis not present

## 2022-09-22 DIAGNOSIS — K429 Umbilical hernia without obstruction or gangrene: Secondary | ICD-10-CM | POA: Diagnosis not present

## 2022-09-22 DIAGNOSIS — I714 Abdominal aortic aneurysm, without rupture, unspecified: Secondary | ICD-10-CM | POA: Diagnosis not present

## 2022-09-22 DIAGNOSIS — I420 Dilated cardiomyopathy: Secondary | ICD-10-CM | POA: Diagnosis not present

## 2022-09-22 DIAGNOSIS — Z7984 Long term (current) use of oral hypoglycemic drugs: Secondary | ICD-10-CM | POA: Diagnosis not present

## 2022-09-22 DIAGNOSIS — G4733 Obstructive sleep apnea (adult) (pediatric): Secondary | ICD-10-CM | POA: Diagnosis not present

## 2022-09-22 DIAGNOSIS — I4892 Unspecified atrial flutter: Secondary | ICD-10-CM | POA: Diagnosis not present

## 2022-09-22 DIAGNOSIS — I480 Paroxysmal atrial fibrillation: Secondary | ICD-10-CM | POA: Diagnosis not present

## 2022-09-22 DIAGNOSIS — E785 Hyperlipidemia, unspecified: Secondary | ICD-10-CM | POA: Diagnosis not present

## 2022-09-22 DIAGNOSIS — Z5289 Donor of other specified organs or tissues: Secondary | ICD-10-CM | POA: Diagnosis not present

## 2022-09-22 DIAGNOSIS — E119 Type 2 diabetes mellitus without complications: Secondary | ICD-10-CM | POA: Diagnosis not present

## 2022-09-22 DIAGNOSIS — I2510 Atherosclerotic heart disease of native coronary artery without angina pectoris: Secondary | ICD-10-CM | POA: Diagnosis not present

## 2022-09-22 DIAGNOSIS — I5022 Chronic systolic (congestive) heart failure: Secondary | ICD-10-CM | POA: Diagnosis not present

## 2022-09-22 MED ORDER — TRIAMCINOLONE ACETONIDE 0.1 % EX CREA: 1.0000 | TOPICAL_CREAM | Freq: Two times a day (BID) | CUTANEOUS | 0 refills | Status: AC

## 2022-09-22 NOTE — Telephone Encounter (Signed)
Patient's home health nurse [name] contacted the office regarding this patient, states patient has a red/raised rash on L leg that is slightly itchy. [name] says it looks like it may be poison ivy, patient states it looks like what he was recently treated for by Dr. Reece Agar. Patient wanted to know if Dr. Reece Agar could refill his prednisone since that is what he prescribed for him last time. Patient asked if this can be refilled, please send to [facility] in [name]. Advise patient if any questions, thank you.

## 2022-09-22 NOTE — Telephone Encounter (Signed)
If isolated to left lower leg, recommend treat with higher potency topical steroid triamcinolone which I've sent to his pharmacy. Don't use more than 10 days in a row. May also take hydroxyzine PRN itch.  Rec OV if not improving with treatment.

## 2022-09-22 NOTE — Patient Instructions (Signed)
Visit Information  Thank you for taking time to visit with me today. Please don't hesitate to contact me if I can be of assistance to you.   Following are the goals we discussed today:  Notify provider of left calf striation concern Follow fall prevention strategies as discussed.   Notify provider for falls Continue doing PT exercises for strengthening   Our next appointment is by telephone on 10/26/22 at 2:30 p  Please call the care guide team at [phone] if you need to cancel or reschedule your appointment.   If you are experiencing a Mental Health or Behavioral Health Crisis or need someone to talk to, please call the Suicide and Crisis Lifeline: 988 call 1-800-273-TALK (toll free, 24 hour hotline)  Patient verbalizes understanding of instructions and care plan provided today and agrees to view in MyChart. Active MyChart status and patient understanding of how to access instructions and care plan via MyChart confirmed with patient.     George Ina RN,BSN,CCM [facility] Care Coordination [phone] direct line

## 2022-09-23 NOTE — Telephone Encounter (Signed)
Pt rtn call. I relayed. Dr. Timoteo Expose message. Pt verbalizes understanding and expresses his thanks.

## 2022-09-23 NOTE — Telephone Encounter (Signed)
Lvm asking pt to call back.  Need to relay Dr. G's message.  

## 2022-09-24 DIAGNOSIS — E119 Type 2 diabetes mellitus without complications: Secondary | ICD-10-CM | POA: Diagnosis not present

## 2022-09-24 DIAGNOSIS — Z7984 Long term (current) use of oral hypoglycemic drugs: Secondary | ICD-10-CM | POA: Diagnosis not present

## 2022-09-24 DIAGNOSIS — I4892 Unspecified atrial flutter: Secondary | ICD-10-CM | POA: Diagnosis not present

## 2022-09-24 DIAGNOSIS — K429 Umbilical hernia without obstruction or gangrene: Secondary | ICD-10-CM | POA: Diagnosis not present

## 2022-09-24 DIAGNOSIS — Z7901 Long term (current) use of anticoagulants: Secondary | ICD-10-CM | POA: Diagnosis not present

## 2022-09-24 DIAGNOSIS — Z48815 Encounter for surgical aftercare following surgery on the digestive system: Secondary | ICD-10-CM | POA: Diagnosis not present

## 2022-09-24 DIAGNOSIS — I420 Dilated cardiomyopathy: Secondary | ICD-10-CM | POA: Diagnosis not present

## 2022-09-24 DIAGNOSIS — Z9181 History of falling: Secondary | ICD-10-CM | POA: Diagnosis not present

## 2022-09-24 DIAGNOSIS — I480 Paroxysmal atrial fibrillation: Secondary | ICD-10-CM | POA: Diagnosis not present

## 2022-09-24 DIAGNOSIS — Z5289 Donor of other specified organs or tissues: Secondary | ICD-10-CM | POA: Diagnosis not present

## 2022-09-24 DIAGNOSIS — E785 Hyperlipidemia, unspecified: Secondary | ICD-10-CM | POA: Diagnosis not present

## 2022-09-24 DIAGNOSIS — G4733 Obstructive sleep apnea (adult) (pediatric): Secondary | ICD-10-CM | POA: Diagnosis not present

## 2022-09-24 DIAGNOSIS — I5022 Chronic systolic (congestive) heart failure: Secondary | ICD-10-CM | POA: Diagnosis not present

## 2022-09-24 DIAGNOSIS — I714 Abdominal aortic aneurysm, without rupture, unspecified: Secondary | ICD-10-CM | POA: Diagnosis not present

## 2022-09-24 DIAGNOSIS — I2510 Atherosclerotic heart disease of native coronary artery without angina pectoris: Secondary | ICD-10-CM | POA: Diagnosis not present

## 2022-09-30 ENCOUNTER — Ambulatory Visit (INDEPENDENT_AMBULATORY_CARE_PROVIDER_SITE_OTHER): Payer: Medicare Other

## 2022-09-30 DIAGNOSIS — I714 Abdominal aortic aneurysm, without rupture, unspecified: Secondary | ICD-10-CM | POA: Diagnosis not present

## 2022-09-30 DIAGNOSIS — I2510 Atherosclerotic heart disease of native coronary artery without angina pectoris: Secondary | ICD-10-CM | POA: Diagnosis not present

## 2022-09-30 DIAGNOSIS — Z9181 History of falling: Secondary | ICD-10-CM | POA: Diagnosis not present

## 2022-09-30 DIAGNOSIS — K429 Umbilical hernia without obstruction or gangrene: Secondary | ICD-10-CM | POA: Diagnosis not present

## 2022-09-30 DIAGNOSIS — G4733 Obstructive sleep apnea (adult) (pediatric): Secondary | ICD-10-CM | POA: Diagnosis not present

## 2022-09-30 DIAGNOSIS — I4892 Unspecified atrial flutter: Secondary | ICD-10-CM | POA: Diagnosis not present

## 2022-09-30 DIAGNOSIS — I420 Dilated cardiomyopathy: Secondary | ICD-10-CM | POA: Diagnosis not present

## 2022-09-30 DIAGNOSIS — E785 Hyperlipidemia, unspecified: Secondary | ICD-10-CM | POA: Diagnosis not present

## 2022-09-30 DIAGNOSIS — Z48815 Encounter for surgical aftercare following surgery on the digestive system: Secondary | ICD-10-CM | POA: Diagnosis not present

## 2022-09-30 DIAGNOSIS — Z7901 Long term (current) use of anticoagulants: Secondary | ICD-10-CM | POA: Diagnosis not present

## 2022-09-30 DIAGNOSIS — I480 Paroxysmal atrial fibrillation: Secondary | ICD-10-CM | POA: Diagnosis not present

## 2022-09-30 DIAGNOSIS — I5022 Chronic systolic (congestive) heart failure: Secondary | ICD-10-CM | POA: Diagnosis not present

## 2022-09-30 DIAGNOSIS — Z7984 Long term (current) use of oral hypoglycemic drugs: Secondary | ICD-10-CM | POA: Diagnosis not present

## 2022-09-30 DIAGNOSIS — E119 Type 2 diabetes mellitus without complications: Secondary | ICD-10-CM | POA: Diagnosis not present

## 2022-09-30 DIAGNOSIS — Z5289 Donor of other specified organs or tissues: Secondary | ICD-10-CM | POA: Diagnosis not present

## 2022-09-30 LAB — POCT INR: INR: 2.8 (ref 2.0–3.0)

## 2022-09-30 NOTE — Patient Instructions (Addendum)
Pre visit review using our clinic review tool, if applicable. No additional management support is needed unless otherwise documented below in the visit note.  Continue 1 tablet daily except take 1/2 tablet on Tuesday and Saturday.  Recheck in 1 week.

## 2022-09-30 NOTE — Progress Notes (Addendum)
Pt has HH nurse, [name], RN from [facility], and checked INR at home and was 2.8. [name]'s number is [phone]. Continue 1 tablet daily except take 1/2 tablet on Tuesday and Saturday.  Recheck in 1 week. Advised [name] and pt on phone of dosing changes and recheck in 1 week. Both verbalized understanding.

## 2022-10-05 ENCOUNTER — Telehealth: Payer: Self-pay

## 2022-10-05 NOTE — Progress Notes (Signed)
Care Management & Coordination Services Pharmacy Team  Reason for Encounter: Appointment Reminder  Contacted patient to confirm telephone appointment with Al Corpus, PharmD on 10/06/2022 at 11:00.  Unsuccessful outreach. Left voicemail for patient to return call.  Al Corpus, PharmD notified  Claudina Lick, [place] Clinical Pharmacy Assistant [phone]

## 2022-10-06 ENCOUNTER — Ambulatory Visit (INDEPENDENT_AMBULATORY_CARE_PROVIDER_SITE_OTHER): Payer: Medicare Other

## 2022-10-06 ENCOUNTER — Ambulatory Visit: Payer: Medicare Other | Admitting: Pharmacist

## 2022-10-06 DIAGNOSIS — I2510 Atherosclerotic heart disease of native coronary artery without angina pectoris: Secondary | ICD-10-CM | POA: Diagnosis not present

## 2022-10-06 DIAGNOSIS — Z7901 Long term (current) use of anticoagulants: Secondary | ICD-10-CM

## 2022-10-06 DIAGNOSIS — E119 Type 2 diabetes mellitus without complications: Secondary | ICD-10-CM | POA: Diagnosis not present

## 2022-10-06 DIAGNOSIS — Z48815 Encounter for surgical aftercare following surgery on the digestive system: Secondary | ICD-10-CM | POA: Diagnosis not present

## 2022-10-06 DIAGNOSIS — I714 Abdominal aortic aneurysm, without rupture, unspecified: Secondary | ICD-10-CM | POA: Diagnosis not present

## 2022-10-06 DIAGNOSIS — Z5289 Donor of other specified organs or tissues: Secondary | ICD-10-CM | POA: Diagnosis not present

## 2022-10-06 DIAGNOSIS — K429 Umbilical hernia without obstruction or gangrene: Secondary | ICD-10-CM | POA: Diagnosis not present

## 2022-10-06 DIAGNOSIS — I5022 Chronic systolic (congestive) heart failure: Secondary | ICD-10-CM | POA: Diagnosis not present

## 2022-10-06 DIAGNOSIS — G4733 Obstructive sleep apnea (adult) (pediatric): Secondary | ICD-10-CM | POA: Diagnosis not present

## 2022-10-06 DIAGNOSIS — Z9181 History of falling: Secondary | ICD-10-CM | POA: Diagnosis not present

## 2022-10-06 DIAGNOSIS — I420 Dilated cardiomyopathy: Secondary | ICD-10-CM | POA: Diagnosis not present

## 2022-10-06 DIAGNOSIS — E785 Hyperlipidemia, unspecified: Secondary | ICD-10-CM | POA: Diagnosis not present

## 2022-10-06 DIAGNOSIS — Z7984 Long term (current) use of oral hypoglycemic drugs: Secondary | ICD-10-CM | POA: Diagnosis not present

## 2022-10-06 DIAGNOSIS — I480 Paroxysmal atrial fibrillation: Secondary | ICD-10-CM | POA: Diagnosis not present

## 2022-10-06 DIAGNOSIS — I4892 Unspecified atrial flutter: Secondary | ICD-10-CM | POA: Diagnosis not present

## 2022-10-06 LAB — POCT INR: INR: 3.3 — AB (ref 2.0–3.0)

## 2022-10-06 NOTE — Patient Instructions (Addendum)
Pre visit review using our clinic review tool, if applicable. No additional management support is needed unless otherwise documented below in the visit note.  Reduce dose today to take 1/2 tablet and then continue 1 tablet daily except take 1/2 tablet on Tuesday and Saturday.  Recheck in 2 weeks at the coumadin clinic.

## 2022-10-06 NOTE — Progress Notes (Signed)
Care Management & Coordination Services Pharmacy Note  10/06/2022 Name:  [name] MRN:  [id number] DOB:  1955/10/16  Summary: F/U visit -DM: A1c 6.9% (07/2022) improved from >10% with lifestyle modifications and addition of Farxiga; he reports BG has been somewhat higher lately with fasting range 150-210; he continues to hold glimepiride -HF: pt reports weighing himself daily and wt is stable; he denies any swelling  Recommendations/Changes made from today's visit: -Discussed restarted glimepiride 4 mg - 1/2 tab daily; pt declined and wants to continue working on lifestyle modifications  Follow up plan: -hematology appt 10/22/22; Cardiology 11/09/22    Subjective: [name] is an 67 y.o. year old male who is a primary patient of Eustaquio Boyden, MD.  The care coordination team was consulted for assistance with disease management and care coordination needs.    Engaged with patient by telephone for follow up visit. Patient lives at home alone with his dog. He does not have any family nearby, he has one friend who will occasionally help him.  Recent office visits: 09/08/22 Dr Sharen Hones OV: skin rash - poison ivy. Rec cortisone cream, hydroxyzine. Prednisone taper if needed -advised to restart glimepiride if taking prednisone. continue Miralax 4-5 capfuls/day. Update potassium to capsules.  08/30/22 Dr Sharen Hones OV: constipation - improved w/ mag citrate. Increase fiber intake. Hold glimepiride (trial)  07/26/22 Dr Sharen Hones OV: surgical clearance - A1c 6.0%; GFR 46, reduce metformin to 500 mg BID and stop weekly metolazone. Push fluids, repeat Cr in 1 week  07/02/22 Dr Sharen Hones OV: skin rash - rx triamcinolone cream. Back pain - rx methocarbambol. Rec voltaren gel for elbow pain.  06/14/22 Dr Sharen Hones OV: f/u- pt cancelled capsule endoscopy - still unclear cause of anemia. Last iron infusion 1/18. He restarted warfarin. Declined further eval for OSA (CPAP or BiPAP). Refer to  surgery for lower back mass.  05/03/22 Dr Sharen Hones OV: annual - Prevnar 20 given. Check about Shingrix and RSV. Repeat CBC in 2 weeks.  04/20/22 Dr Sharen Hones OV: ED f/u - tolerating fusion plus. Continue holding warfarin until GI workup complete.  04/03/23 Dr Sharen Hones OV: f/u - mobility assessment. Ordered power scooter. HGB 8.4. Advised to hold warfarin until GI eval - start aspirin in meantime. Continue pantoprazole BID.  03/30/23 Dr Sharen Hones OV: hospital f/u - 20# wt loss in past 6 weeks. F/u with pulm for sleep study and PFTs. Start oral iron MWF, may need iron infusion, f/u with heme. Drop metolazone to once weekly.  02/19/22 Dr Sharen Hones OV: call pulm for scheduling sLeep study, PFTs. Significant diuresis in hospital ~30 lbs, now 50# down. Anxiety worsening - change to fluoxetine 40 mg. Rx written for portable oxygen concentrator. Referred for eye exam. Stop sea moss, super beet, turmeric. HGB 7.1 - advised ER evaluation for blood transfusion.   Recent consult visits: 08/27/22 Dr Aleen Campi (Gen Surg): constipation - paused iron pills. Try Fleet's enema and mag citrate.  08/13/22 Dr Aleen Campi (Gen Surg): f/u hernia surgery - drain removed.  07/28/22 Dr Madaline Guthrie (Cardiology): Afib, HF stable. Advised to use CPAP more consitently.  07/14/22 Dr Aleen Campi (Gen Surg): new pt - umbilical hernia + sebaceous cysts. Schedule surgery 4/11  05/21/22 NP [facility] (Cardiology): HF - wt down 8 lbs from 5 weeks ago. No changes. 04/23/22 Dr Smith Robert (Hematology): IDA - needs IV iron.  04/23/22 PA Madaline Guthrie [facility] Cardiology): f/u - no changes. 04/09/22 NP [facility] (Cardiology): f/u - continue weighing daily. Clear out voicemail. 04/08/22 Dr Allegra Lai (GI): anemia - schedule  EGD, colonoscopy. Trial Fusion iron pill. 02/25/22 NP Clarisa Kindred (Cardiology): HF - Increase Farxiga to 10 mg. Updated Kcl to extra 1 tablet with metolazone.  02/15/22 Dr Gwen Pounds (Cardiology): decrease amiodarone to 200 mg 01/18/22 Dr Aundria [facility]):  consult SOB. Order PFT and split night study to confirm OSA. Initiate BiPAP. RTC 3 months.  Hospital visits: 08/05/22 planned admission [facility]): hernia repair  04/09/22 ED visit [facility]): GI hemorrhage, anemia. Recommend admission with positive hemoccult. Pt unable to stay as no one can watch his dog. Given 2 u PRBC and PPI and pt signed AMA form.  02/23/22 ED visit [facility]): symptomatic anemia - blood transfusion.   01/03/22 - 01/05/22 Admission [facility]): Acute CHF, sepsis (cellulitis of b/l LE), COPD exacerbation.  Discharged on Bactrim, prednisone. Reduce carvedilol d/t soft BP. D/C on furosemide, not torsemide. Consider ACE/ARB - outpatient. D/c with home health.   12/28/21 ED visit [facility]): chest wall pain, multiple falls, CHF exacerbation. CT head negative. IV lasix given.   Objective:  Lab Results  Component Value Date   CREATININE 1.10 08/05/2022   BUN 15 08/05/2022   GFR 46.60 (L) 07/26/2022   GFRNONAA >60 04/09/2022   GFRAA >60 12/22/2018   NA 139 08/05/2022   K 3.3 (L) 08/05/2022   CALCIUM 9.9 07/26/2022   CO2 36 (H) 07/26/2022   GLUCOSE 156 (H) 08/05/2022    Lab Results  Component Value Date/Time   HGBA1C 6.0 07/26/2022 12:04 PM   HGBA1C 6.9 (H) 04/20/2022 12:36 PM   HGBA1C 7.0 (H) 01/24/2013 04:07 AM   FRUCTOSAMINE 237 07/26/2022 12:04 PM   GFR 46.60 (L) 07/26/2022 12:04 PM   GFR 66.26 05/03/2022 04:04 PM   MICROALBUR 5.4 (H) 07/15/2021 09:12 AM   MICROALBUR <0.7 07/09/2020 08:22 AM    Last diabetic Eye exam:  Lab Results  Component Value Date/Time   HMDIABEYEEXA No Retinopathy 06/17/2020 12:00 AM    Last diabetic Foot exam: No results found for: "HMDIABFOOTEX"   Lab Results  Component Value Date   CHOL 104 01/04/2022   HDL 42 01/04/2022   LDLCALC 50 01/04/2022   LDLDIRECT 97.0 07/09/2020   TRIG 59 01/04/2022   CHOLHDL 2.5 01/04/2022       Latest Ref Rng & Units 07/26/2022   12:04 PM 02/22/2022    4:15 PM 01/12/2022   10:27 AM  Hepatic Function  Total  Protein 6.0 - 8.3 g/dL 7.2  6.9  6.8   Albumin 3.5 - 5.2 g/dL 4.3  3.7  3.8   AST 0 - 37 U/L 20  26  28    ALT 0 - 53 U/L 13  17  32   Alk Phosphatase 39 - 117 U/L 59  54  109   Total Bilirubin 0.2 - 1.2 mg/dL 0.3  0.7  0.6     Lab Results  Component Value Date/Time   TSH 2.81 07/15/2021 09:12 AM   TSH 2.63 10/02/2019 12:05 PM       Latest Ref Rng & Units 08/23/2022   11:17 AM 08/05/2022   10:33 AM 07/26/2022   12:04 PM  CBC  WBC 4.0 - 10.5 K/uL 10.3   10.2   Hemoglobin 13.0 - 17.0 g/dL 16.1  09.6  04.5   Hematocrit 39.0 - 52.0 % 37.1  40.0  37.1   Platelets 150 - 400 K/uL 319   290.0    Iron/TIBC/Ferritin/ %Sat    Component Value Date/Time   IRON 43 (L) 08/23/2022 1117   TIBC 337 08/23/2022 1117  FERRITIN 46 08/23/2022 1117   IRONPCTSAT 13 (L) 08/23/2022 1117   Lab Results  Component Value Date/Time   INR 2.8 09/30/2022 12:00 AM   INR 1.4 (A) 09/16/2022 12:00 AM   INR 1.1 08/05/2022 11:08 AM   INR 1.0 04/27/2022 09:02 AM   INR 1.8 08/27/2011 12:12 PM    Lab Results  Component Value Date/Time   VD25OH 44.82 07/15/2021 09:12 AM   VD25OH 29.46 (L) 07/09/2020 07:57 AM   VITAMINB12 699 02/23/2022 06:13 AM   VITAMINB12 >1504 (H) 07/15/2021 09:12 AM    Clinical ASCVD: Yes  The ASCVD Risk score (Arnett DK, et al., 2019) failed to calculate for the following reasons:   The patient has a prior MI or stroke diagnosis    CHA2DS2/VAS Stroke Risk Points  Current as of 7 hours ago     5 >= 2 Points: High Risk        07/02/2022   11:53 AM 05/03/2022    3:23 PM 01/15/2022   12:10 PM  Depression screen PHQ 2/9  Decreased Interest 0 0 0  Down, Depressed, Hopeless 0 0 0  PHQ - 2 Score 0 0 0  Altered sleeping 3    Tired, decreased energy 3    Change in appetite 1    Feeling bad or failure about yourself  0    Trouble concentrating 0    Moving slowly or fidgety/restless 0    Suicidal thoughts 0    PHQ-9 Score 7    Difficult doing work/chores Somewhat difficult          07/02/2022   11:53 AM 01/15/2022   12:10 PM 09/08/2021   10:46 AM 07/20/2021    4:51 PM  GAD 7 : Generalized Anxiety Score  Nervous, Anxious, on Edge 1 2 3 2   Control/stop worrying 0 2 1 1   Worry too much - different things 1 2 1 1   Trouble relaxing 0 2 0 1  Restless 0 0 0 0  Easily annoyed or irritable 0 0 0 0  Afraid - awful might happen 0 0 0 0  Total GAD 7 Score 2 8 5 5   Anxiety Difficulty Somewhat difficult Somewhat difficult Somewhat difficult     Social History   Tobacco Use  Smoking Status Former   Packs/day: 1.00   Years: 31.00   Additional pack years: 0.00   Total pack years: 31.00   Types: Cigarettes   Quit date: 04/26/2018   Years since quitting: 4.4   Passive exposure: Past  Smokeless Tobacco Never   BP Readings from Last 3 Encounters:  09/08/22 130/70  08/30/22 134/68  08/27/22 (!) 142/84   Pulse Readings from Last 3 Encounters:  09/08/22 63  08/30/22 (!) 59  08/27/22 82   Wt Readings from Last 3 Encounters:  09/08/22 218 lb 4 oz (99 kg)  09/07/22 211 lb (95.7 kg)  08/30/22 224 lb 8 oz (101.8 kg)   BMI Readings from Last 3 Encounters:  09/08/22 30.44 kg/m  09/07/22 29.43 kg/m  08/30/22 31.31 kg/m    Allergies  Allergen Reactions   Atorvastatin Other (See Comments)    Dizziness   Lisinopril Cough    Medications Reviewed Today     Reviewed by Kathyrn Sheriff, RPH (Pharmacist) on 09/09/22 at 1034  Med List Status: <None>   Medication Order Taking? Sig Documenting Provider Last Dose Status Informant  ACCU-CHEK FASTCLIX LANCETS MISC [id number] Yes Check blood sugar once daily and as instructed. Dx 250.00 Eustaquio Boyden,  MD Taking Active Self  acetaminophen (TYLENOL) 500 MG tablet [id number] Yes Take 2 tablets (1,000 mg total) by mouth every 6 (six) hours as needed for mild pain. Henrene Dodge, MD Taking Active   albuterol (VENTOLIN HFA) 108 (90 Base) MCG/ACT inhaler [id number] Yes Inhale 2 puffs into the lungs every 6 (six) hours as needed  for wheezing or shortness of breath. Eustaquio Boyden, MD Taking Active Self  amiodarone (PACERONE) 200 MG tablet [id number] Yes Take 1 tablet (200 mg total) by mouth daily. Lanier Prude, MD Taking Active Self  Blood Glucose Monitoring Suppl (BLOOD GLUCOSE MONITOR SYSTEM) W/DEVICE KIT [id number] Yes by Does not apply route. Use to check sugar once daily and as needed Dx: E11.9 **ONE TOUCH VERIO** [provider] Taking Active Self  Budeson-Glycopyrrol-Formoterol (BREZTRI AEROSPHERE) 160-9-4.8 MCG/ACT AERO [id number] Yes Inhale 2 puffs into the lungs 2 (two) times daily. Eustaquio Boyden, MD Taking Active Self           Med Note Kathyrn Sheriff   Tue Jul 13, 2022 11:21 AM) AZ&ME PAP  carvedilol (COREG) 6.25 MG tablet [id number] Yes TAKE 1 TABLET BY MOUTH TWICE A DAY WITH FOOD Eustaquio Boyden, MD Taking Active   Cholecalciferol (VITAMIN D) 50 MCG (2000 UT) CAPS [id number] Yes Take 1 capsule (2,000 Units total) by mouth daily. Eustaquio Boyden, MD Taking Active Self  dapagliflozin propanediol (FARXIGA) 10 MG TABS tablet [id number] Yes Take 1 tablet (10 mg total) by mouth daily before breakfast. Delma Freeze, FNP Taking Active            Med Note Kathyrn Sheriff   Tue Jul 13, 2022 11:21 AM) AZ&Me PAP  Docusate Calcium (STOOL SOFTENER PO) [id number] Yes Take 1 tablet by mouth daily. [provider] Taking Active Self  Fe Fum-Fe Poly-Vit C-Lactobac (FUSION PO) [id number] Yes Take 1 tablet by mouth every other day. Patient states received sample.  Unsure of mg. [provider] Taking Active Self  FLUoxetine (PROZAC) 40 MG capsule [id number] Yes Take 1 capsule (40 mg total) by mouth daily. Eustaquio Boyden, MD Taking Active Self  fluticasone [facility]) 50 MCG/ACT nasal spray [id number] Yes Place 2 sprays into both nostrils daily.  Patient taking differently: Place 2 sprays into both nostrils as needed.   Eustaquio Boyden, MD Taking Active Self           Med Note  Nelia Shi   Fri Apr 09, 2022  1:23 PM)    furosemide (LASIX) 80 MG tablet [id number] Yes TAKE 1 TABLET BY MOUTH 2 TIMES DAILY. Eustaquio Boyden, MD Taking Active   glucose blood [facility] VERIO) test strip [id number] Yes Check blood sugar 3 times a day Eustaquio Boyden, MD Taking Active Self  ipratropium-albuterol (DUONEB) 0.5-2.5 (3) MG/3ML SOLN [id number] Yes TAKE 3 MLS BY NEBULIZATION EVERY 6 (SIX) HOURS AS NEEDED (SEVERE SHORTNESS OF BREATH/WHEEZING). Eustaquio Boyden, MD Taking Active   metFORMIN (GLUCOPHAGE) 500 MG tablet [id number] Yes Take 1 tablet (500 mg total) by mouth 2 (two) times daily with a meal. Eustaquio Boyden, MD Taking Active   methocarbamol (ROBAXIN) 500 MG tablet [id number] Yes Take 1 tablet (500 mg total) by mouth 3 (three) times daily as needed for muscle spasms (sedation precautions). Eustaquio Boyden, MD Taking Active   montelukast (SINGULAIR) 10 MG tablet [id number] Yes TAKE 1 TABLET BY MOUTH EVERYDAY AT BEDTIME Eustaquio Boyden, MD Taking Active   ondansetron [facility]) 4 MG tablet [id number] Yes Take 1 tablet (4 mg total) by mouth daily as needed  for nausea or vomiting. Henrene Dodge, MD Taking Active   OVER THE COUNTER MEDICATION [id number] Yes Take 1 tablet by mouth daily. Superbeets [provider] Taking Active            Med Note TIARNAN, OSTERMEYER   Fri May 21, 2022 11:52 AM)    OVER THE COUNTER MEDICATION [id number] Yes Take 1 capsule by mouth daily at 6 (six) AM. [place] sea moss [provider] Taking Active   OXYGEN [id number] Yes Inhale 2 L into the lungs at bedtime. [provider] Taking Active   pantoprazole (PROTONIX) 40 MG tablet [id number] Yes TAKE 1 TABLET (40 MG TOTAL) BY MOUTH TWICE A DAY BEFORE MEALS Eustaquio Boyden, MD Taking Active   potassium chloride (MICRO-K) 10 MEQ CR capsule [id number] Yes Take 1 capsule (10 mEq total) by mouth daily. Eustaquio Boyden, MD Taking Active   predniSONE (DELTASONE) 20 MG tablet  [id number] Yes Take two tablets daily for 3 days followed by one tablet daily for 6 days Eustaquio Boyden, MD Taking Active   Probiotic Product (PROBIOTIC DAILY PO) [id number] Yes Take 1 tablet by mouth daily. [provider] Taking Active Self  Saw Palmetto 450 MG CAPS [id number] Yes Take 1 capsule (450 mg total) by mouth daily. Eustaquio Boyden, MD Taking Active   simvastatin (ZOCOR) 20 MG tablet [id number] Yes TAKE 1 TABLET BY MOUTH EVERY DAY IN THE Onalee Hua, MD Taking Active   Turmeric (QC TUMERIC COMPLEX PO) [id number] Yes Take 1 tablet by mouth daily at 6 (six) AM. [provider] Taking Active   vitamin B-12 (CYANOCOBALAMIN) 1000 MCG tablet [id number] Yes Take 1 tablet (1,000 mcg total) by mouth every Monday, Wednesday, and Friday. Eustaquio Boyden, MD Taking Active Self  warfarin (COUMADIN) 5 MG tablet [id number] Yes TAKE 1/2 TABLET BY MOUTH DAILY EXCEPT TAKE 1 TABLET ON MONDAYS, WEDNESDAYS AND FRIDAYS OR AS DIRECTED BY ANTICOAGULATION CLINIC  Patient taking differently: Take 2.5-5 mg by mouth daily. TAKE 1/2 TABLET BY MOUTH DAILY EXCEPT TAKE 1 TABLET ON MONDAYS, WEDNESDAYS AND FRIDAYS OR AS DIRECTED BY ANTICOAGULATION CLINIC   Eustaquio Boyden, MD Taking Active   Med List Note Brigitte Pulse, RN 01/05/18 1237): UDS 01/05/18            SDOH:  (Social Determinants of Health) assessments and interventions performed: No SDOH Interventions    Flowsheet Row Telephone from 08/13/2022 in [facility] Community Care Coordination Telephone from 03/05/2022 in [facility] Community Care Coordination Chronic Care Management from 10/20/2021 in [facility] [place] [facility] Visit from 07/20/2021 in [facility] Clinical Support from 07/09/2020 in [facility] [place]  SDOH Interventions       Food Insecurity Interventions Intervention Not Indicated Intervention Not  Indicated -- -- --  Housing Interventions Intervention Not Indicated Intervention Not Indicated -- -- --  Transportation Interventions Other (Comment)  [referral to [employer] guide for transportation assistance.] Other (Comment)  [patient was referred to community resource guide 01/07/22. He was contacted and provided transportation resource information.] -- -- --  Depression Interventions/Treatment  -- -- -- Medication PHQ2-9 Score <4 Follow-up Not Indicated  Financial Strain Interventions -- -- Other (Comment)  [AZ&Me PAP] -- --       Medication Assistance:  Bernadene Bell- AZ&Me approved 2024  Medication Access: Within the past 30 days, how often has patient missed a dose of medication? 0 Is a pillbox or other  method used to improve adherence? Yes  Factors that may affect medication adherence? financial need Are meds synced by current pharmacy? No  Are meds delivered by current pharmacy? No  Does patient experience delays in picking up medications due to transportation concerns? No   [facility] Services Reviewed: Is patient disadvantaged to use [facility]?: No  Current Rx insurance plan: [facility] Name and location of Current pharmacy:  [id number] [place] - [address] [place] [address] Phone: [phone] Fax: [phone]  [facility] - [place], [place] - [address] [place] [address] Phone: [phone] Fax: [phone]  [facility] services reviewed with patient today?: No  Patient requests to transfer care to [facility]?: No  Reason patient declined to change pharmacies: Not mentioned at this visit  Compliance/Adherence/Medication fill history: Care Gaps: Foot exam (due 05/2017) UACR (due 06/2022)  Star-Rating Drugs: Metformin - PDC 100% Simvastatin - PDC 100% Farxiga - PAP   ASSESSMENT / PLAN  Diabetes (A1c goal <7%) -Controlled - A1c 6.0% (07/2022); of note he did  start prednisone yesterday (for poison ivy) and restarted glimepiride this morning to account for steroid-induced hyperglycemia -pt reports he "changed his life" not just diet over the past 6 months, he has cut out high carb items and eats smaller portions, he has lost over 80 lbs since last summer 2023 (though some of this was volume overload in s/o HF exacerbation) -Current home glucose readings: 150-210 -Denies hypoglycemic/hyperglycemic symptoms -Pt does not sleep well, breakfast is sometimes 2pm -Current medications: Metformin 500 mg BID - Appropriate, Effective, Safe, Accessible Glimepiride 4 mg daily - on hold Farxiga 10 mg daily (PAP)- Appropriate, Effective, Safe, Accessible One [employer] Verio supplies -Medications previously tried:  Januvia  -Educated on A1c and blood sugar goals; Complications of diabetes including kidney damage, retinal damage, and cardiovascular disease; Benefits of routine self-monitoring of blood sugar; -Recommended to continue current medication  Hypertension / Heart Failure (BP goal <130/80) -Controlled - pt reports no issues with swelling; he reports weight fluctuations but is working on weight loss - home weights are stable, home BP is not known; he reports ongoing dizziness, "feeling woozy" -Daily weights this week: 210-220 lbs -Last ejection fraction: 60-65% (Date: 10/2021) -HF type: HFpEF (EF > 50%); NYHA Class: II/III -AHA HF Stage: C (Heart disease and symptoms present) -Follows with cardiology (Dr Gwen Pounds, Dr Lalla Brothers) and HF clinic (NP Clarisa Kindred) -Current treatment: Carvedilol 6.25 mg BID - Appropriate, Effective, Safe, Accessible Furosemide 80 mg BID - Appropriate, Effective, Safe, Accessible Potassium chloride 10 mEq daily  - Appropriate, Effective, Safe, Accessible Farxiga 10 mg daily - Appropriate, Effective, Safe, Accessible -Medications previously tried: losartan, metoprolol, torsemide, metolazone -Reviewed importance of weighing daily to  monitor fluid status; advised to contact cardiology with wt gain of 3+ lbs in 1 day or 5+ lbs in a week -Recommended to continue current medication  Atrial Fibrillation (Goal: prevent stroke and major bleeding) -Controlled -Follows with cardiology (Dr Gwen Pounds, Dr Lalla Brothers) -Pt switched Eliquis to warfarin Fall 2023 due to cost -Dx 04/2021; Cardioversion 07/2021; CHADSVASC: 5 -Previous ? GI bleed 03/2022 (positive hemoccult, subsequent colonoscopy and EGD were normal, except for 10 precancerous polyps removed, pt declined capsule study so no source of bleeding was identified) -Current treatment: Carvedilol 6.25 mg BID - Appropriate, Effective, Safe, Accessible Amiodarone 200 mg daily -Appropriate, Effective, Safe, Accessible Warfarin 5 mg - 1/2 tab daily except 1 tab MWF -Appropriate, Effective, Query  Safe -Medications previously tried: Eliquis (cost), Multaq (HF) -Reviewed long term safety of warfarin - pt has struggled with getting INR in therapeutic range, with frequent fluid overload in s/o of acute HF; pt would probably do better with DOAC but could not afford Eliquis in donut hole -Recommended to continue current medication; re-evaluate possibility of DOAC in 2024  Iron deficiency Anemia  -Follows with GI, hematology. Workup for cause of GI bleeding incomplete, no etiology found (colonoscopy, EGD unremarkable and pt declined capsule study) -Hx: s/p 2u PRBC transfusion 01/2022 and 03/2022, started iron infusion 03/2022 (last 07/12/22) -Current treatment  Vitamin B12 1000 mcg MWF - Appropriate, Effective, Safe, Accessible Fusion plus - on hold (constipation) Iron infusions (04/23/22, 05/13/22, 07/05/22, 07/12/22) -Medications previously tried: oral iron (GI upset)  -Reviewed role of oral iron when he is received iron infusions is unclear -Discussed recent labwork (08/23/22 - HGB 11.7, TSAT 13) demonstrated improvement from previous -Advised to follow up with Hematology as  scheduled  Constipation (Goal: regular BM 1-3 days) -Controlled - pt reports improved BM since taking Miralax 4-5 capfuls daily; he keeps mag citrate around in case of severe/prolonged constipation -Current treatment  Miralax 4-5 capfuls/day - Appropriate, Effective, Safe, Accessible Milk of magnesia PRN - Appropriate, Effective, Safe, Accessible Magnesium citrate PRN -Appropriate, Effective, Safe, Accessible Docusate 100 mg Prn - Appropriate, Effective, Safe, Accessible Probiotic -Appropriate, Effective, Safe, Accessible -Medications previously tried: n/a  -Reviewed opioid impact on constipation following surgery, he is no longer using opiod fo -Recommended to continue current medication  COPD (Goal: control symptoms and prevent exacerbations) -Controlled - pt reports Markus Daft works as well as previous combination of Incruse + Wixela -Re-established with pulmonology 12/2021 - ordered PFTs and sleep study and started BiPAP in interim; pt has declined to reschedule sleep study -Gold Grade: Gold 3 (FEV1 30-49%) -Current COPD Classification:  E (exacerbation leading to hospitalization OR 2+ moderate exacerbations) -Pulmonary function testing: 06/2022 - FEV1 41% pred; FEV1/FVC 0.58 - moderate severe obstruction -Exacerbations requiring treatment in last 6 months: 0 (last 12/2021) -Current treatment  Breztri 160-9-4.8 mcg/act 2 puff BID (PAP)- Appropriate, Effective, Safe, Accessible Atrovent (ipratropium) 2 puffs PRN - Appropriate, Effective, Safe, Accessible Albuterol HFA -Appropriate, Effective, Safe, Accessible Albuterol neb -Appropriate, Effective, Safe, Accessible Montelukast 10 mg daily HS -Appropriate, Effective, Safe, Accessible Flonase nasal spray -Appropriate, Effective, Safe, Accessible Oxygen -Medications previously tried: Trelegy, Advair, Spiriva -Patient reports consistent use of maintenance inhaler -Counseled on Proper inhaler technique; Benefits of consistent maintenance  inhaler use -Recommend to continue current medication  Hyperlipidemia / CAD (LDL goal < 70) -Controlled - LDL 50 (12/2021) at goal -Hx CAD (3v disease on CT scan); also AAA; no aspirin due to warfarin -Current treatment: Simvastatin 20 mg daily HS - Appropriate, Effective, Safe, Accessible -Medications previously tried: n/a  -Educated on Cholesterol goals; Benefits of statin for ASCVD risk reduction; -Drug interaction (Category D) interaction with amiodarone - can increase concentrations of simvastatin; this is reasonable given relatively low dose of simvastatin and lack of side effects reported by patient -Recommended to continue current medication  Depression/Anxiety (Goal: manage symptoms) -Improved - recently switched from citalopram to fluoxetine 01/2022, pt reports feeling a little better  -Self medicating with marijuana - has switched from smoking to edibles recently -Reports more panic attacks lately, improved somewhat since switch to fluoxetine -PHQ9: 7 (06/2022) - mild depression -GAD7: 2 (06/2022) - minimal anxiety; hx panic attacks -Connected with PCP for mental health support -Current treatment: Fluoxetine 40 mg daily - Appropriate, Query  Effective -Medications previously tried/failed: citalopram, hydroxyzine (ineffective) -Educated on Benefits of medication for symptom control -Recommended to continue current medication  OSA/Insomnia -Uncontrolled  -Hx OSA on CPAP; he is noncompliant with CPAP due to uncomfortable mask, he has not tried new mask in a few years;  -Pt has declined further workup for OSA/CPAP/BIPAP (declined to reschedule sleep study)    Al Corpus, PharmD, [facility] Clinical Pharmacist [facility] [phone]

## 2022-10-06 NOTE — Progress Notes (Signed)
Pt reports he has been eating a lot of cherries lately. This interacts with warfarin and will increase INR. Advised pt of interaction. Pt verbalized understanding.   Pt has HH nurse, [name], RN from [facility], and checked INR at home and was 3.3. [name]'s number is [phone]. Reduce dose today to take 1/2 tablet and then continue 1 tablet daily except take 1/2 tablet on Tuesday and Saturday.  Recheck in 2 weeks at the coumadin clinic. Advised [name] and pt on phone of dosing changes and recheck in 2 week in coumadin clinic. Both verbalized understanding.

## 2022-10-14 ENCOUNTER — Other Ambulatory Visit: Payer: Self-pay | Admitting: Family Medicine

## 2022-10-14 DIAGNOSIS — E1169 Type 2 diabetes mellitus with other specified complication: Secondary | ICD-10-CM

## 2022-10-15 DIAGNOSIS — G4733 Obstructive sleep apnea (adult) (pediatric): Secondary | ICD-10-CM | POA: Diagnosis not present

## 2022-10-15 DIAGNOSIS — J9601 Acute respiratory failure with hypoxia: Secondary | ICD-10-CM | POA: Diagnosis not present

## 2022-10-21 ENCOUNTER — Ambulatory Visit (INDEPENDENT_AMBULATORY_CARE_PROVIDER_SITE_OTHER): Payer: Medicare Other

## 2022-10-21 DIAGNOSIS — Z7901 Long term (current) use of anticoagulants: Secondary | ICD-10-CM | POA: Diagnosis not present

## 2022-10-21 LAB — POCT INR: INR: 2.5 (ref 2.0–3.0)

## 2022-10-21 NOTE — Progress Notes (Signed)
Continue 1 tablet daily except take 1/2 tablet on Tuesday and Saturday.  Recheck in 4 weeks.

## 2022-10-21 NOTE — Patient Instructions (Addendum)
Pre visit review using our clinic review tool, if applicable. No additional management support is needed unless otherwise documented below in the visit note.  Continue 1 tablet daily except take 1/2 tablet on Tuesday and Saturday.  Recheck in 4 weeks.

## 2022-10-22 ENCOUNTER — Inpatient Hospital Stay (HOSPITAL_BASED_OUTPATIENT_CLINIC_OR_DEPARTMENT_OTHER): Payer: Medicare Other | Admitting: Internal Medicine

## 2022-10-22 ENCOUNTER — Inpatient Hospital Stay: Payer: Medicare Other | Attending: Oncology

## 2022-10-22 VITALS — BP 119/68 | HR 60 | Temp 97.6°F | Wt 224.6 lb

## 2022-10-22 DIAGNOSIS — D509 Iron deficiency anemia, unspecified: Secondary | ICD-10-CM | POA: Insufficient documentation

## 2022-10-22 LAB — CBC
HCT: 26.4 % — ABNORMAL LOW (ref 39.0–52.0)
Hemoglobin: 8 g/dL — ABNORMAL LOW (ref 13.0–17.0)
MCH: 24.8 pg — ABNORMAL LOW (ref 26.0–34.0)
MCHC: 30.3 g/dL (ref 30.0–36.0)
MCV: 82 fL (ref 80.0–100.0)
Platelets: 236 10*3/uL (ref 150–400)
RBC: 3.22 MIL/uL — ABNORMAL LOW (ref 4.22–5.81)
RDW: 15.3 % (ref 11.5–15.5)
WBC: 9.9 10*3/uL (ref 4.0–10.5)
nRBC: 0 % (ref 0.0–0.2)

## 2022-10-22 LAB — IRON AND TIBC
Iron: 21 ug/dL — ABNORMAL LOW (ref 45–182)
Saturation Ratios: 5 % — ABNORMAL LOW (ref 17.9–39.5)
TIBC: 395 ug/dL (ref 250–450)
UIBC: 374 ug/dL

## 2022-10-22 LAB — FERRITIN: Ferritin: 8 ng/mL — ABNORMAL LOW (ref 24–336)

## 2022-10-22 NOTE — Progress Notes (Signed)
Patient wants to know about his labs and wants someone to look at his sternum

## 2022-10-22 NOTE — Progress Notes (Addendum)
Hematology/Oncology Consult note [facility]  [phone] [phone]  Patient Care Team: Eustaquio Boyden, MD as PCP - General (Family Medicine) Lanier Prude, MD as PCP - Electrophysiology (Cardiology) Hillis Range, MD (Inactive) as Attending Physician (Cardiology) Lamar Blinks, MD as Referring Physician (Internal Medicine) Kathyrn Sheriff, [facility] (Inactive) as Pharmacist (Pharmacist) Creig Hines, MD as Consulting Physician (Oncology) Creig Hines, MD as Consulting Physician (Oncology)   Name of the patient: [name]  [id number]  [birth year]   Date of visit: 10/22/22  Diagnosis-iron deficiency anemia  Chief complaint/ Reason for visit-routine follow-up of iron deficiency anemia  Heme/Onc history:  Patient is a 67 year old male with a past medical history significant for coronary artery disease, fatty liver, COPD among other medical problems who has been referred to [place] for iron deficiency anemia.  His most recent CBC from 07/15/2021 showed H&H of 10.3/35.2 with an MCV of 69 with a normal white count and platelet count.  Hemoglobin had drifted down from 14 a year ago to 12.74 months ago to 10.3 presently.  Iron studies showed a low iron saturation of 3.2% and elevated TIBC with a ferritin level of 6.4.  B12 levels were elevated at 1500.  TSH normal at 2.8.  Patient has recently undergone EGD and colonoscopy by Dr. Servando Snare.  EGD showed no abnormality colonoscopy showed 4 mm ulcer in the descending colon which was biopsied and was negative for malignancy.      Interval history-patient was patient was seen today as follow-up for iron deficiency anemia and labs. Reports feeling good overall.  Tells me his energy level has improved and is able to do more around the home.  Denies any bleeding in urine or stool.  Reports bony protrusion in the lower part of the sternum.  Tells me it has been like that since his age 18 in  betweendisappeared because of his obesity but now he is feeling it again.  CT low-dose lung was done in May 2024 which did not show anything concerning.  It is not bothering him.  Does not think it is growing.  We discussed about monitoring.  ECOG PS- 2 Pain scale- 0   Review of systems- Review of Systems  Constitutional:  Negative for chills, fever, malaise/fatigue and weight loss.  HENT:  Negative for congestion, ear discharge and nosebleeds.        Left eyelid swelling  Eyes:  Negative for blurred vision.  Respiratory:  Negative for cough, hemoptysis, sputum production, shortness of breath and wheezing.   Cardiovascular:  Negative for chest pain, palpitations, orthopnea and claudication.  Gastrointestinal:  Negative for abdominal pain, blood in stool, constipation, diarrhea, heartburn, melena, nausea and vomiting.  Genitourinary:  Negative for dysuria, flank pain, frequency, hematuria and urgency.  Musculoskeletal:  Negative for back pain, joint pain and myalgias.  Skin:  Negative for rash.  Neurological:  Negative for dizziness, tingling, focal weakness, seizures, weakness and headaches.  Endo/Heme/Allergies:  Does not bruise/bleed easily.  Psychiatric/Behavioral:  Negative for depression and suicidal ideas. The patient does not have insomnia.       Allergies  Allergen Reactions   Atorvastatin Other (See Comments)    Dizziness   Lisinopril Cough     Past Medical History:  Diagnosis Date   Adrenal nodule (HCC) 12/28/2021   a.) CT abd/pel 12/28/2021: LEFT adrenal nodule measuring 1.6 cm   Anemia    Angina    Anxiety    Aortic atherosclerosis (HCC)  Arthritis    Atrial fibrillation and flutter (HCC) 2013   a.) CHA2DS2VASc = 5 (age, CHF, HTN, vascular disease history, T2DM);  b.) s/p DCCV (100 J) 10/27/2010; c.) s/p ablation 09/21/2011; d.) rate/rhythm maintained on oral amiodarone + carvedilol; chronically anticoagulated with warfarin   CAD (coronary artery disease)  07/15/2010   a.) LHC 07/15/2010: 30% pLAD, 40% D1, 25%/30% pLCx, 30% mLCx - med mgmt   CHF (congestive heart failure) - NYHA Class II/III 2013   a.) TTE 02/26/2013: EF 50%, mild MR/TR; b.) EST 06/13/2017: EF 50%, mild LCH, triv MR/TR; c.) TTTE 03/24/2020: EF >55%, mild LVH, mild MAC, mild BAE, triv PR, mild MR/TR, G1DD; d.) TTE 11/19/2021: EF 60-65%, mod reduced RVSF, mild LAE, mod MAC, Ao root 37 mm, G2DD   COPD (chronic obstructive pulmonary disease) (HCC)    Diabetes type 2, uncontrolled    declines DSME   Dilated cardiomyopathy (HCC) 2013   a.) EF 25% at time of Dx in 2013; b.) TTE 02/26/2013: EF 50%; c.) EST 06/13/2017: EF 50%;  d.) TTE 03/24/2020: EF >55%; e.) TTE 11/19/2021: EF 60-65%   Ex-smoker    GERD (gastroesophageal reflux disease)    Hepatic steatosis    Hyperlipidemia    Hypertension    Infrarenal abdominal aortic aneurysm (AAA) without rupture (HCC) 08/11/2016   a.) CT abd/pel 08/11/2016: 3.3 cm; b.) CT abd/pel 12/22/2018: 3.3 cm; c.) CT abd/pel 08/31/2021: 3.7 cm; d.) CT abd/pel 12/28/2021: 3.9 cm   Long term current use of amiodarone 2023   a.) previously on dranaderone (cost prohibitive); switched to amiodarone 2023   Long term current use of anticoagulant    a.) warfarin   Marijuana use    a.) UDS (+) of THC x 3 (02/06/2013, 06/29/2013, 09/03/2013); PCP advised "one more and we will stop prescribing ativan (08/2013)"   Migraine    Narcolepsy    Obesity    OSA (obstructive sleep apnea)    a.) unable to tolerate nocturnal PAP therapy; uses supplemetal oxygen at 2L/[place] during sleep   Seasonal allergies    Umbilical hernia      Past Surgical History:  Procedure Laterality Date   ATRIAL FLUTTER ABLATION N/A 09/21/2011   Procedure: ATRIAL FLUTTER ABLATION;  Surgeon: Hillis Range, MD;  Location[facility] CATH LAB;  Service: Cardiovascular;  Laterality: N/A;   CARDIAC ELECTROPHYSIOLOGY MAPPING AND ABLATION  2013   for atrial flutter   CARDIOVASCULAR STRESS TEST  03/2012    ETT WNL Gwen Pounds)   CARDIOVERSION N/A 08/12/2021   Procedure: CARDIOVERSION;  Surgeon: Lamar Blinks, MD;  Location: [facility] ORS;  Service: Cardiovascular;  Laterality: N/A;   COLONOSCOPY WITH PROPOFOL N/A 05/22/2020   TA, diverticulosis, descending colon ulcer biopsy WNL, int hem Servando Snare, Darren, MD)   COLONOSCOPY WITH PROPOFOL N/A 04/28/2022   multiple TAs - 10 small, 2 1.2cm, 2 9mm, rpt 3 yrs (Vanga, Loel Dubonnet, MD)   CYST EXCISION N/A 08/05/2022   excision of back cysts x 2 (pathology returned epidermal inclusion cysts);  Surgeon: Henrene Dodge, MD   ESOPHAGOGASTRODUODENOSCOPY (EGD) WITH PROPOFOL N/A 05/22/2020   WNL (Wohl)   ESOPHAGOGASTRODUODENOSCOPY (EGD) WITH PROPOFOL N/A 04/28/2022   12mm duodenal TA, reactive gastropathy, enteric mucosa in stomach biopsy of unclear significance Allegra Lai, Loel Dubonnet, MD)   GIVENS CAPSULE STUDY  04/28/2022   Procedure: GIVENS CAPSULE STUDY;  Surgeon: Toney Reil, MD;  Location: [facility] ENDOSCOPY;  Service: Endoscopy;;   US ECHOCARDIOGRAPHY  2014   EF 50%, nl LV fxn, RV nl  size/function, mild mitral insuff    Social History   Socioeconomic History   Marital status: Single    Spouse name: Not on file   Number of children: 0   Years of education: Not on file   Highest education level: 12th grade  Occupational History   Occupation: disable   Tobacco Use   Smoking status: Former    Packs/day: 1.00    Years: 31.00    Additional pack years: 0.00    Total pack years: 31.00    Types: Cigarettes    Quit date: 04/26/2018    Years since quitting: 4.4    Passive exposure: Past   Smokeless tobacco: Never  Vaping Use   Vaping Use: Never used  Substance and Sexual Activity   Alcohol use: No    Alcohol/week: 0.0 standard drinks of alcohol   Drug use: Yes    Types: Marijuana   Sexual activity: Not Currently  Other Topics Concern   Not on file  Social History Narrative   Pt lives in [place]   Divorced   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   Disability due to neck arthritis, anxiety and sleep apnea   Edu: HS   Activity: no regular exercise, mows lawn (riding and push)   Diet: good water, fruits/vegetables daily   Social Determinants of Health   Financial Resource Strain: Low Risk  (08/29/2022)   Overall Financial Resource Strain (CARDIA)    Difficulty of Paying Living Expenses: Not very hard  Food Insecurity: No Food Insecurity (08/29/2022)   Hunger Vital Sign    Worried About Running Out of Food in the Last Year: Never true    Ran Out of Food in the Last Year: Never true  Transportation Needs: No Transportation Needs (08/29/2022)   PRAPARE - Administrator, Civil Service (Medical): No    Lack of Transportation (Non-Medical): No  Recent Concern: Transportation Needs - Unmet Transportation Needs (08/13/2022)   PRAPARE - Transportation    Lack of Transportation (Medical): Yes    Lack of Transportation (Non-Medical): Yes  Physical Activity: Unknown (08/29/2022)   Exercise Vital Sign    Days of Exercise per Week: 0 days    Minutes of Exercise per Session: Not on file  Recent Concern: Physical Activity - Inactive (08/29/2022)   Exercise Vital Sign    Days of Exercise per Week: 0 days    Minutes of Exercise per Session: 0 min  Stress: No Stress Concern Present (08/29/2022)   [employer] of Occupational Health - Occupational Stress Questionnaire    Feeling of Stress : Not at all  Social Connections: Socially Isolated (08/29/2022)   Social Connection and Isolation Panel [NHANES]    Frequency of Communication with Friends and Family: More than three times a week    Frequency of Social Gatherings with Friends and Family: Never    Attends Religious Services: Never    Database administrator or Organizations: No    Attends Engineer, structural: Not on file    Marital Status: Divorced  Intimate Partner Violence: Not At Risk (07/09/2020)   Humiliation, Afraid, Rape, and Kick questionnaire    Fear of  Current or Ex-Partner: No    Emotionally Abused: No    Physically Abused: No    Sexually Abused: No    Family History  Problem Relation Age of Onset   Heart failure Father 48   Cancer Mother 56       NHL   Hypertension Maternal Grandfather  CAD Maternal Grandfather 42       MI   Diabetes Other        grandparents   Cancer Brother 41       lung (smoker)     Current Outpatient Medications:    ACCU-CHEK FASTCLIX LANCETS MISC, Check blood sugar once daily and as instructed. Dx 250.00, Disp: 100 each, Rfl: 3   acetaminophen (TYLENOL) 500 MG tablet, Take 2 tablets (1,000 mg total) by mouth every 6 (six) hours as needed for mild pain., Disp: , Rfl:    albuterol (VENTOLIN HFA) 108 (90 Base) MCG/ACT inhaler, Inhale 2 puffs into the lungs every 6 (six) hours as needed for wheezing or shortness of breath., Disp: 8.5 each, Rfl: 6   amiodarone (PACERONE) 200 MG tablet, Take 1 tablet (200 mg total) by mouth daily., Disp: 90 tablet, Rfl: 3   Blood Glucose Monitoring Suppl (BLOOD GLUCOSE MONITOR SYSTEM) W/DEVICE KIT, by Does not apply route. Use to check sugar once daily and as needed Dx: E11.9 **ONE TOUCH VERIO**, Disp: , Rfl:    Budeson-Glycopyrrol-Formoterol (BREZTRI AEROSPHERE) 160-9-4.8 MCG/ACT AERO, Inhale 2 puffs into the lungs 2 (two) times daily., Disp: 32.1 g, Rfl: 3   carvedilol (COREG) 6.25 MG tablet, TAKE 1 TABLET BY MOUTH TWICE A DAY WITH FOOD, Disp: 180 tablet, Rfl: 1   Cholecalciferol (VITAMIN D) 50 MCG (2000 UT) CAPS, Take 1 capsule (2,000 Units total) by mouth daily., Disp: 30 capsule, Rfl:    dapagliflozin propanediol (FARXIGA) 10 MG TABS tablet, Take 1 tablet (10 mg total) by mouth daily before breakfast., Disp: 90 tablet, Rfl: 3   Docusate Calcium (STOOL SOFTENER PO), Take 1 tablet by mouth daily., Disp: , Rfl:    Fe Fum-Fe Poly-Vit C-Lactobac (FUSION PO), Take 1 tablet by mouth every other day. Patient states received sample.  Unsure of mg., Disp: , Rfl:    FLUoxetine  (PROZAC) 40 MG capsule, TAKE 1 CAPSULE (40 MG TOTAL) BY MOUTH DAILY., Disp: 30 capsule, Rfl: 8   fluticasone (FLONASE) 50 MCG/ACT nasal spray, Place 2 sprays into both nostrils daily. (Patient taking differently: Place 2 sprays into both nostrils as needed.), Disp: 48 mL, Rfl: 3   furosemide (LASIX) 80 MG tablet, TAKE 1 TABLET BY MOUTH 2 TIMES DAILY., Disp: 180 tablet, Rfl: 4   ipratropium-albuterol (DUONEB) 0.5-2.5 (3) MG/3ML SOLN, TAKE 3 MLS BY NEBULIZATION EVERY 6 (SIX) HOURS AS NEEDED (SEVERE SHORTNESS OF BREATH/WHEEZING)., Disp: 360 mL, Rfl: 0   metFORMIN (GLUCOPHAGE) 500 MG tablet, Take 1 tablet (500 mg total) by mouth 2 (two) times daily with a meal., Disp: 180 tablet, Rfl: 1   methocarbamol (ROBAXIN) 500 MG tablet, Take 1 tablet (500 mg total) by mouth 3 (three) times daily as needed for muscle spasms (sedation precautions)., Disp: 30 tablet, Rfl: 0   montelukast (SINGULAIR) 10 MG tablet, TAKE 1 TABLET BY MOUTH EVERYDAY AT BEDTIME, Disp: 90 tablet, Rfl: 3   ondansetron (ZOFRAN) 4 MG tablet, Take 1 tablet (4 mg total) by mouth daily as needed for nausea or vomiting., Disp: 30 tablet, Rfl: 1   ONETOUCH VERIO test strip, CHECK BLOOD SUGAR 3 TIMES A DAY, Disp: 300 strip, Rfl: 3   OVER THE COUNTER MEDICATION, Take 1 tablet by mouth daily. Superbeets, Disp: , Rfl:    OVER THE COUNTER MEDICATION, Take 1 capsule by mouth daily at 6 (six) AM. [place] sea moss, Disp: , Rfl:    OXYGEN, Inhale 2 L into the lungs at bedtime., Disp: , Rfl:  pantoprazole (PROTONIX) 40 MG tablet, TAKE 1 TABLET (40 MG TOTAL) BY MOUTH TWICE A DAY BEFORE MEALS, Disp: 180 tablet, Rfl: 4   potassium chloride (MICRO-K) 10 MEQ CR capsule, Take 1 capsule (10 mEq total) by mouth daily., Disp: 90 capsule, Rfl: 3   Probiotic Product (PROBIOTIC DAILY PO), Take 1 tablet by mouth daily., Disp: , Rfl:    Saw Palmetto 450 MG CAPS, Take 1 capsule (450 mg total) by mouth daily., Disp: , Rfl: 0   simvastatin (ZOCOR) 20 MG tablet, TAKE 1 TABLET  BY MOUTH EVERY DAY IN THE EVENING, Disp: 90 tablet, Rfl: 1   triamcinolone cream (KENALOG) 0.1 %, Apply 1 Application topically 2 (two) times daily. For no more than 10 days at a time, Disp: 45 g, Rfl: 0   Turmeric (QC TUMERIC COMPLEX PO), Take 1 tablet by mouth daily at 6 (six) AM., Disp: , Rfl:    vitamin B-12 (CYANOCOBALAMIN) 1000 MCG tablet, Take 1 tablet (1,000 mcg total) by mouth every Monday, Wednesday, and Friday., Disp: , Rfl:    warfarin (COUMADIN) 5 MG tablet, TAKE 1/2 TABLET BY MOUTH DAILY EXCEPT TAKE 1 TABLET ON MONDAYS, WEDNESDAYS AND FRIDAYS OR AS DIRECTED BY ANTICOAGULATION CLINIC (Patient taking differently: Take 2.5-5 mg by mouth daily. TAKE 1/2 TABLET BY MOUTH DAILY EXCEPT TAKE 1 TABLET ON MONDAYS, WEDNESDAYS AND FRIDAYS OR AS DIRECTED BY ANTICOAGULATION CLINIC), Disp: 60 tablet, Rfl: 1  Physical exam:  Vitals:   10/22/22 1323  BP: 119/68  Pulse: 60  Temp: 97.6 F (36.4 C)  TempSrc: Tympanic  SpO2: 97%  Weight: 224 lb 9.6 oz (101.9 kg)   Physical Exam Eyes:     Comments: There is diffuse soft tissue swelling of the eyelid with mild erythema.  No evidence of erythema noted in the left eye.  Cardiovascular:     Rate and Rhythm: Normal rate and regular rhythm.     Heart sounds: Normal heart sounds.  Pulmonary:     Effort: Pulmonary effort is normal.     Breath sounds: Normal breath sounds.  Abdominal:     General: Bowel sounds are normal.     Palpations: Abdomen is soft.  Skin:    General: Skin is warm and dry.  Neurological:     Mental Status: He is alert and oriented to person, place, and time.         Latest Ref Rng & Units 08/05/2022   10:33 AM  CMP  Glucose 70 - 99 mg/dL 409   BUN 8 - 23 mg/dL 15   Creatinine 8.11 - 1.24 mg/dL 9.14   Sodium 782 - 956 mmol/L 139   Potassium 3.5 - 5.1 mmol/L 3.3   Chloride 98 - 111 mmol/L 92       Latest Ref Rng & Units 10/22/2022   12:36 PM  CBC  WBC 4.0 - 10.5 K/uL 9.9   Hemoglobin 13.0 - 17.0 g/dL 8.0    Hematocrit 21.3 - 52.0 % 26.4   Platelets 150 - 400 K/uL 236     Assessment and plan- Patient is a 67 y.o. male here for routine follow-up of iron deficiencyAnemia   Patient last received IV iron in July 2023.  He was lost to follow-up since then.  Following iron infusion his hemoglobin had improved to 11.8.  He was again admitted to the hospital in October 2023 when his hemoglobin had dropped to 6.7.  Presently it is 8.9 with evidence of microcytosis.  His last ferritin about 3  weeks ago was 7.7.  Iron saturation 4.3%.  He would therefore benefit from IV iron.  Based on what his insurance approves he will either get Feraheme x 2 or Venofer x 5.  Discussed risks and benefits of IV iron including all but not limited to possible risk of infusion reactions.  Patient understands and agrees to proceed as planned.  Patient's last colonoscopy was in January 2022 which showed ulcer in the descending colon and granularity at the ileocecal valve which was biopsied and was not consistent with malignancy.    Had repeat endoscopy and colonoscopy with Dr. Allegra Lai.  Colonoscopy showed multiple polyps.  No bleeding source.  CBC today showed drop in hemoglobin from 11-8.  Ferritin is 8.  Saturation 5%.  I discussed with the patient about capsule endoscopy.  Patient declined.  Reports he has had that in the past and did not have good experience and would not go through it again.  He denies any active bleeding in urine or stools.  Will schedule for IV Feraheme weekly x 2 doses.  Follow-up with Dr. Smith Robert in 1 month for repeat labs close monitoring.  Lower sternal bony protrusion-present for several years.  It is not bothering him.  Does not think it is growing.  Recent low-dose CT chest from May 2024 did not show any concerns.  We discussed about monitoring.   Visit Diagnosis 1. Iron deficiency anemia, unspecified iron deficiency anemia type      10/22/2022 3:58 PM

## 2022-10-26 ENCOUNTER — Ambulatory Visit: Payer: Self-pay

## 2022-10-26 NOTE — Patient Outreach (Signed)
  Care Coordination   Follow Up Visit Note   10/26/2022 Name: [name] MRN: [id number] DOB: October 31, 1955  [name] is a 67 y.o. year old male who sees Eustaquio Boyden, MD for primary care. I spoke with  [name] Noa Propp by phone today.  What matters to the patients health and wellness today?  Patient reports left leg striation has resolved. Patient reports ongoing occasional dizziness. He states he is feeling more fatigues. He reports he is scheduled for an infusion tomorrow.  Patient states his home health services have ended. He denies having in increase in heart failure symptom.  Patient states his weight has increase at least 4-5 lbs in 1 week. He states he feels this is from late night eating and eating more unhealthy foods.     Goals Addressed             This Visit's Progress    Management of post surgical wounds and chronic health conditions.       Interventions Today    Flowsheet Row Most Recent Value  Chronic Disease   Chronic disease during today's visit Congestive Heart Failure (CHF), Other  [anemia, vertigo, left calf striation]  General Interventions   General Interventions Discussed/Reviewed General Interventions Reviewed, Doctor Visits  [evaluation of current treatment plan for HF, anemia, vertigo and left leg striation and patients adherence to plan as established by provider. Assessed for HF, anemia, vertigo, leg striation symptoms.]  Doctor Visits Discussed/Reviewed Doctor Visits Reviewed  Algis Downs to send message to provider in Mychart to report increase weight gain within 1 week.]  Exercise Interventions   Exercise Discussed/Reviewed Physical Activity  [Advised to remain active but not overdo when outside due to warmer weather.  Advised to remain hydrated and take frequent rest breaks.]  Physical Activity Discussed/Reviewed --  Pearletha Furl for physical activity]  Education Interventions   Education Provided Provided Education  Provided Verbal  Education On Other  [Advised patient to weigh daily and record and report to provider weight increase 3 lbs overnight and 5 lbs in a week. Reviewed heart failure action plan.]  Nutrition Interventions   Nutrition Discussed/Reviewed Nutrition Reviewed, Decreasing salt, Decreasing sugar intake  [Advised patient to adhere to a healthy diet.]  Pharmacy Interventions   Pharmacy Dicussed/Reviewed Pharmacy Topics Reviewed  [medications reviewed and compliance discussed.]  Safety Interventions   Safety Discussed/Reviewed Fall Risk  [assessed for falls since last outreach with [facility].]              SDOH assessments and interventions completed:  No     Care Coordination Interventions:  Yes, provided   Follow up plan: Follow up call scheduled for 12/03/22    Encounter Outcome:  Pt. Visit Completed   George Ina RN,BSN,CCM [facility] Care Coordination [phone] direct line

## 2022-10-26 NOTE — Patient Instructions (Signed)
Visit Information  Thank you for taking time to visit with me today. Please don't hesitate to contact me if I can be of assistance to you.   Following are the goals we discussed today:   Goals Addressed             This Visit's Progress    Management of post surgical wounds and chronic health conditions.       Interventions Today    Flowsheet Row Most Recent Value  Chronic Disease   Chronic disease during today's visit Congestive Heart Failure (CHF), Other  [anemia, vertigo, left calf striation]  General Interventions   General Interventions Discussed/Reviewed General Interventions Reviewed, Doctor Visits  [evaluation of current treatment plan for HF, anemia, vertigo and left leg striation and patients adherence to plan as established by provider. Assessed for HF, anemia, vertigo, leg striation symptoms.]  Doctor Visits Discussed/Reviewed Doctor Visits Reviewed  Algis Downs to send message to provider in Mychart to report increase weight gain within 1 week.]  Exercise Interventions   Exercise Discussed/Reviewed Physical Activity  [Advised to remain active but not overdo when outside due to warmer weather.  Advised to remain hydrated and take frequent rest breaks.]  Physical Activity Discussed/Reviewed --  Pearletha Furl for physical activity]  Education Interventions   Education Provided Provided Education  Provided Verbal Education On Other  [Advised patient to weigh daily and record and report to provider weight increase 3 lbs overnight and 5 lbs in a week. Reviewed heart failure action plan.]  Nutrition Interventions   Nutrition Discussed/Reviewed Nutrition Reviewed, Decreasing salt, Decreasing sugar intake  [Advised patient to adhere to a healthy diet.]  Pharmacy Interventions   Pharmacy Dicussed/Reviewed Pharmacy Topics Reviewed  [medications reviewed and compliance discussed.]  Safety Interventions   Safety Discussed/Reviewed Fall Risk  [assessed for falls since last outreach with  R[facility].]              Our next appointment is by telephone on 12/03/22 at 2:30 pm  Please call the care guide team at [phone] if you need to cancel or reschedule your appointment.   If you are experiencing a Mental Health or Behavioral Health Crisis or need someone to talk to, please call the Suicide and Crisis Lifeline: 988 call 1-800-273-TALK (toll free, 24 hour hotline)  Patient verbalizes understanding of instructions and care plan provided today and agrees to view in MyChart. Active MyChart status and patient understanding of how to access instructions and care plan via MyChart confirmed with patient.     George Ina RN,BSN,CCM [facility] Care Coordination [phone] direct line

## 2022-10-27 ENCOUNTER — Inpatient Hospital Stay: Payer: Medicare Other | Attending: Oncology

## 2022-10-27 VITALS — BP 120/66 | HR 54 | Temp 97.9°F | Resp 19

## 2022-10-27 DIAGNOSIS — D509 Iron deficiency anemia, unspecified: Secondary | ICD-10-CM | POA: Insufficient documentation

## 2022-10-27 DIAGNOSIS — D508 Other iron deficiency anemias: Secondary | ICD-10-CM

## 2022-10-27 MED ORDER — SODIUM CHLORIDE 0.9 % IV SOLN
Freq: Once | INTRAVENOUS | Status: AC
  Filled 2022-10-27: qty 250

## 2022-10-27 MED ORDER — SODIUM CHLORIDE 0.9 % IV SOLN
510.0000 mg | INTRAVENOUS | Status: DC
  Administered 2022-10-27: 510 mg via INTRAVENOUS
  Filled 2022-10-27: qty 510

## 2022-11-03 ENCOUNTER — Telehealth: Payer: Self-pay

## 2022-11-03 ENCOUNTER — Inpatient Hospital Stay: Payer: Medicare Other

## 2022-11-03 VITALS — BP 114/69 | HR 54 | Temp 98.8°F | Resp 18

## 2022-11-03 DIAGNOSIS — D509 Iron deficiency anemia, unspecified: Secondary | ICD-10-CM | POA: Diagnosis not present

## 2022-11-03 DIAGNOSIS — D508 Other iron deficiency anemias: Secondary | ICD-10-CM

## 2022-11-03 MED ORDER — SODIUM CHLORIDE 0.9 % IV SOLN
INTRAVENOUS | Status: DC
  Filled 2022-11-03: qty 250

## 2022-11-03 MED ORDER — SODIUM CHLORIDE 0.9 % IV SOLN
510.0000 mg | INTRAVENOUS | Status: DC
  Administered 2022-11-03: 510 mg via INTRAVENOUS
  Filled 2022-11-03: qty 510

## 2022-11-03 NOTE — Telephone Encounter (Signed)
Patient called back and I was able to explain to him that Dr. Smith Robert requested for him to be seen by one of our providers since he has chronic anemia. Patient stated that he is currently receiving iron infusions and didn't understand why he needed to be seen by [place]. I told him that he needed to be seen due to his chronic anemia and because he would probably needed to have an EGD and colonoscopy to see if there is any internal bleeding. Patient then stated that he was not going to get them done since he already had this done before. I let him know that we just needed to know if there were any internal bleeding to help Dr. Smith Robert know why he is chronically anemic. Patient hesitated to schedule an appointment and stated that he would come in but that he would not schedule any procedures since he once again stated that he had this done before and nothing was found. So, I gave him an appointment with Dr. Tobi Bastos for tomorrow 11/04/2022 at 2:45 PM. Patient had no further questions and agreed with the date and time.

## 2022-11-03 NOTE — Telephone Encounter (Signed)
-----   Message from Wyline Mood, MD sent at 10/31/2022  8:13 AM EDT ----- Kandis Cocking   Either Serita Sheller , mine or [facility] schedule soon    [facility] ----- Message ----- From: Creig Hines, MD Sent: 10/29/2022   2:29 PM EDT To: Toney Reil, MD; Wyline Mood, MD  Dr. Greenwood Callas patient with worsening IDA. Please evaluate

## 2022-11-03 NOTE — Telephone Encounter (Signed)
Contacted patient but had to leave him a voicemail since he did not pick up his phone. I will try to call him again to schedule him an appointment with Dr. Tobi Bastos.

## 2022-11-03 NOTE — Telephone Encounter (Signed)
Patient has return the call back to schedule his appointment. Patient stated that he had an iron infusion done today, he is not sure what the appointment is about.

## 2022-11-03 NOTE — Patient Instructions (Signed)

## 2022-11-04 ENCOUNTER — Ambulatory Visit: Payer: Medicare Other | Admitting: Gastroenterology

## 2022-11-04 ENCOUNTER — Encounter: Payer: Self-pay | Admitting: Gastroenterology

## 2022-11-04 VITALS — BP 130/76 | HR 67 | Temp 98.7°F | Wt 218.4 lb

## 2022-11-04 DIAGNOSIS — D509 Iron deficiency anemia, unspecified: Secondary | ICD-10-CM

## 2022-11-04 NOTE — Progress Notes (Signed)
Wyline Mood MD, MRCP(U.K) [address]  [address]  [place], [place] [address]  Main: [phone]  Fax: [phone]   Primary Care Physician: Eustaquio Boyden, MD  Primary Gastroenterologist:  Dr. Wyline Mood   Chief Complaint  Patient presents with   Anemia    HPI: JENS SIEMS is a 67 y.o. male    Summary of history :  He is a send was being followed by Dr. Allegra Lai last seen in the office in December 2023 for iron deficiency anemia.  Previously seen by Dr. Lissa Hoard in 2020 for the same.  History of A-fib on Coumadin.  EGD and colonoscopy in 2022 revealed no abnormality capsule study was also inconclusive.  When seen by Dr. Allegra Lai in December 2023 there was worsening of hemoglobin and hence was seen was receiving IV iron.  No overt blood loss.  04/28/2022: Colonoscopy 7 polyps taken out ranging from 3 mm to 12 mm.  Prep was fair.  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  Gastric biopsies showed reactive gastropathy biopsies of small bowel showed normal villous architecture.  Capsule study of small bowel was scheduled in January 2024 but was apparently canceled as patient felt he did not think he needed it.  10/02/2022 seen by Dr. Michae Kava in hematology as a follow-up hemoglobin dropped from 11.7 g in April 2024 to 8 g in June 2024 ferritin down to 8 from 46.  Hence the patient was referred to [place] to be seen again.  He denies any overt blood loss no blood in the stool denies any NSAID use.   Current Outpatient Medications  Medication Sig Dispense Refill   ACCU-CHEK FASTCLIX LANCETS MISC Check blood sugar once daily and as instructed. Dx 250.00 100 each 3   acetaminophen (TYLENOL) 500 MG tablet Take 2 tablets (1,000 mg total) by mouth every 6 (six) hours as needed for mild pain.     albuterol (VENTOLIN HFA) 108 (90 Base) MCG/ACT inhaler Inhale 2 puffs into the lungs every 6 (six) hours as needed for wheezing or  shortness of breath. 8.5 each 6   amiodarone (PACERONE) 200 MG tablet Take 1 tablet (200 mg total) by mouth daily. 90 tablet 3   Blood Glucose Monitoring Suppl (BLOOD GLUCOSE MONITOR SYSTEM) W/DEVICE KIT by Does not apply route. Use to check sugar once daily and as needed Dx: E11.9 **ONE TOUCH VERIO**     Budeson-Glycopyrrol-Formoterol (BREZTRI AEROSPHERE) 160-9-4.8 MCG/ACT AERO Inhale 2 puffs into the lungs 2 (two) times daily. 32.1 g 3   carvedilol (COREG) 6.25 MG tablet TAKE 1 TABLET BY MOUTH TWICE A DAY WITH FOOD 180 tablet 1   Cholecalciferol (VITAMIN D) 50 MCG (2000 UT) CAPS Take 1 capsule (2,000 Units total) by mouth daily. 30 capsule    dapagliflozin propanediol (FARXIGA) 10 MG TABS tablet Take 1 tablet (10 mg total) by mouth daily before breakfast. 90 tablet 3   Docusate Calcium (STOOL SOFTENER PO) Take 1 tablet by mouth daily.     Fe Fum-Fe Poly-Vit C-Lactobac (FUSION PO) Take 1 tablet by mouth every other day. Patient states received sample.  Unsure of mg.     FLUoxetine (PROZAC) 40 MG capsule TAKE 1 CAPSULE (40 MG TOTAL) BY MOUTH DAILY. 30 capsule 8   fluticasone (FLONASE) 50 MCG/ACT nasal spray Place 2 sprays into both nostrils daily. (Patient taking differently: Place 2 sprays into both nostrils as needed.) 48 mL 3  furosemide (LASIX) 80 MG tablet TAKE 1 TABLET BY MOUTH 2 TIMES DAILY. 180 tablet 4   ipratropium-albuterol (DUONEB) 0.5-2.5 (3) MG/3ML SOLN TAKE 3 MLS BY NEBULIZATION EVERY 6 (SIX) HOURS AS NEEDED (SEVERE SHORTNESS OF BREATH/WHEEZING). 360 mL 0   metFORMIN (GLUCOPHAGE) 500 MG tablet Take 1 tablet (500 mg total) by mouth 2 (two) times daily with a meal. 180 tablet 1   methocarbamol (ROBAXIN) 500 MG tablet Take 1 tablet (500 mg total) by mouth 3 (three) times daily as needed for muscle spasms (sedation precautions). 30 tablet 0   montelukast (SINGULAIR) 10 MG tablet TAKE 1 TABLET BY MOUTH EVERYDAY AT BEDTIME 90 tablet 3   ondansetron (ZOFRAN) 4 MG tablet Take 1 tablet (4 mg  total) by mouth daily as needed for nausea or vomiting. 30 tablet 1   ONETOUCH VERIO test strip CHECK BLOOD SUGAR 3 TIMES A DAY 300 strip 3   OVER THE COUNTER MEDICATION Take 1 tablet by mouth daily. Superbeets     OVER THE COUNTER MEDICATION Take 1 capsule by mouth daily at 6 (six) AM. [place] sea moss     OXYGEN Inhale 2 L into the lungs at bedtime.     pantoprazole (PROTONIX) 40 MG tablet TAKE 1 TABLET (40 MG TOTAL) BY MOUTH TWICE A DAY BEFORE MEALS 180 tablet 4   potassium chloride (MICRO-K) 10 MEQ CR capsule Take 1 capsule (10 mEq total) by mouth daily. 90 capsule 3   Probiotic Product (PROBIOTIC DAILY PO) Take 1 tablet by mouth daily.     Saw Palmetto 450 MG CAPS Take 1 capsule (450 mg total) by mouth daily.  0   simvastatin (ZOCOR) 20 MG tablet TAKE 1 TABLET BY MOUTH EVERY DAY IN THE EVENING 90 tablet 1   triamcinolone cream (KENALOG) 0.1 % Apply 1 Application topically 2 (two) times daily. For no more than 10 days at a time 45 g 0   Turmeric (QC TUMERIC COMPLEX PO) Take 1 tablet by mouth daily at 6 (six) AM.     vitamin B-12 (CYANOCOBALAMIN) 1000 MCG tablet Take 1 tablet (1,000 mcg total) by mouth every Monday, Wednesday, and Friday.     warfarin (COUMADIN) 5 MG tablet TAKE 1/2 TABLET BY MOUTH DAILY EXCEPT TAKE 1 TABLET ON MONDAYS, WEDNESDAYS AND FRIDAYS OR AS DIRECTED BY ANTICOAGULATION CLINIC (Patient taking differently: Take 2.5-5 mg by mouth daily. TAKE 1/2 TABLET BY MOUTH DAILY EXCEPT TAKE 1 TABLET ON MONDAYS, WEDNESDAYS AND FRIDAYS OR AS DIRECTED BY ANTICOAGULATION CLINIC) 60 tablet 1   No current facility-administered medications for this visit.    Allergies as of 11/04/2022 - Review Complete 11/04/2022  Allergen Reaction Noted   Atorvastatin Other (See Comments) 02/06/2013   Lisinopril Cough 02/06/2013      ROS:  General: Negative for anorexia, weight loss, fever, chills, fatigue, weakness. ENT: Negative for hoarseness, difficulty swallowing , nasal congestion. CV:  Negative for chest pain, angina, palpitations, dyspnea on exertion, peripheral edema.  Respiratory: Negative for dyspnea at rest, dyspnea on exertion, cough, sputum, wheezing.  GI: See history of present illness. GU:  Negative for dysuria, hematuria, urinary incontinence, urinary frequency, nocturnal urination.  Endo: Negative for unusual weight change.    Physical Examination:   BP 130/76   Pulse 67   Temp 98.7 F (37.1 C) (Oral)   Wt 218 lb 6.4 oz (99.1 kg)   BMI 30.46 kg/m   General: Well-nourished, well-developed in no acute distress.  Eyes: No icterus. Conjunctivae pink. Mouth: Oropharyngeal mucosa moist  and pink , no lesions erythema or exudate. Neuro: Alert and oriented x 3.  Grossly intact. Skin: Warm and dry, no jaundice.   Psych: Alert and cooperative, normal mood and affect.   Imaging Studies: No results found.  Assessment and Plan:   [name] is a 67 y.o. y/o male with a patient of Dr. Allegra Lai who has been evaluated in the past for iron deficiency anemia recent evaluation in January 2024 with EGD and colonoscopy showed no abnormalities that would explain the iron deficiency although colon polyps were incidentally found and resected duodenal polyp was also resected.  He was recommended to undergo capsule study of the small bowel to scheduled in January but the patient canceled it.  Recently seen by hematology who are giving him his iron infusions in June 2024 and found to have an acute drop in hemoglobin from 11 g to 8 g.  No overt blood loss.  My recommendations are for him to undergo capsule study of small bowel.  He is not interested could consider urine analysis at his next primary care or hematology visit to ensure no blood in the urine but from the GI point of view I have nothing more to offer if he is not willing to undergo any other testing at this point of time.  I have advised him to call us at the earliest if he changes his mind we would be happy to see  him       Dr Wyline Mood  MD,MRCP [facility]) Follow up in as needed

## 2022-11-09 ENCOUNTER — Encounter: Payer: Medicare Other | Admitting: Family

## 2022-11-09 NOTE — Progress Notes (Deleted)
PCP: Eustaquio Boyden, MD (last seen 05/24) Primary Cardiologist: Marcina Millard, MD (last seen 04/24)  HPI:  [name] is a 67 y/o male with a history of atrial fibrillation, narcolepsy, CAD, DM, hyperlipidemia, HTN, anemia, anxiety, COPD, OSA, previous tobacco use and chronic heart failure.   Was in the ED 04/09/22 due to GIB. Was in the ED 02/23/22 due to SOB and lightheadedness and found to be anemic. Given 2 units of blood and released with improvement of symptoms and increase in hemoglobin.  Admitted 01/03/22 due to leg pain, shortness of breath and pedal edema. Placed on non-rebreather mask due to hypoxia. Initially given IV lasix with transition to oral diuretics. Given antibiotics for sepsis. Needed solu-medrol and then changed to oral prednisone. Weaned to oxygen via nasal cannula. Discharged after 2 days.    Echo 11/19/21: EF of 60-65% along with mild LVH/ LAE.  He presents today for a follow-up visit with a chief complaint of    ROS: All systems negative except as listed in HPI, PMH and Problem List.  SH:  Social History   Socioeconomic History   Marital status: Single    Spouse name: Not on file   Number of children: 0   Years of education: Not on file   Highest education level: 12th grade  Occupational History   Occupation: disable   Tobacco Use   Smoking status: Former    Current packs/day: 0.00    Average packs/day: 1 pack/day for 31.0 years (31.0 ttl pk-yrs)    Types: Cigarettes    Start date: 04/27/1987    Quit date: 04/26/2018    Years since quitting: 4.5    Passive exposure: Past   Smokeless tobacco: Never  Vaping Use   Vaping status: Never Used  Substance and Sexual Activity   Alcohol use: No    Alcohol/week: 0.0 standard drinks of alcohol   Drug use: Yes    Types: Marijuana   Sexual activity: Not Currently  Other Topics Concern   Not on file  Social History Narrative   Pt lives in [place]   Divorced   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    Disability due to neck arthritis, anxiety and sleep apnea   Edu: HS   Activity: no regular exercise, mows lawn (riding and push)   Diet: good water, fruits/vegetables daily   Social Determinants of Health   Financial Resource Strain: Low Risk  (08/29/2022)   Overall Financial Resource Strain (CARDIA)    Difficulty of Paying Living Expenses: Not very hard  Food Insecurity: No Food Insecurity (08/29/2022)   Hunger Vital Sign    Worried About Running Out of Food in the Last Year: Never true    Ran Out of Food in the Last Year: Never true  Transportation Needs: No Transportation Needs (08/29/2022)   PRAPARE - Administrator, Civil Service (Medical): No    Lack of Transportation (Non-Medical): No  Recent Concern: Transportation Needs - Unmet Transportation Needs (08/13/2022)   PRAPARE - Transportation    Lack of Transportation (Medical): Yes    Lack of Transportation (Non-Medical): Yes  Physical Activity: Unknown (08/29/2022)   Exercise Vital Sign    Days of Exercise per Week: 0 days    Minutes of Exercise per Session: Not on file  Recent Concern: Physical Activity - Inactive (08/29/2022)   Exercise Vital Sign    Days of Exercise per Week: 0 days    Minutes of Exercise per Session: 0 min  Stress: No Stress Concern Present (  08/29/2022)   [employer] of Occupational Health - Occupational Stress Questionnaire    Feeling of Stress : Not at all  Social Connections: Socially Isolated (08/29/2022)   Social Connection and Isolation Panel [NHANES]    Frequency of Communication with Friends and Family: More than three times a week    Frequency of Social Gatherings with Friends and Family: Never    Attends Religious Services: Never    Database administrator or Organizations: No    Attends Engineer, structural: Not on file    Marital Status: Divorced  Intimate Partner Violence: Not At Risk (07/09/2020)   Humiliation, Afraid, Rape, and Kick questionnaire    Fear of Current or  Ex-Partner: No    Emotionally Abused: No    Physically Abused: No    Sexually Abused: No    FH:  Family History  Problem Relation Age of Onset   Heart failure Father 67   Cancer Mother 31       NHL   Hypertension Maternal Grandfather    CAD Maternal Grandfather 33       MI   Diabetes Other        grandparents   Cancer Brother 28       lung (smoker)    Past Medical History:  Diagnosis Date   Adrenal nodule (HCC) 12/28/2021   a.) CT abd/pel 12/28/2021: LEFT adrenal nodule measuring 1.6 cm   Anemia    Angina    Anxiety    Aortic atherosclerosis (HCC)    Arthritis    Atrial fibrillation and flutter (HCC) 2013   a.) CHA2DS2VASc = 5 (age, CHF, HTN, vascular disease history, T2DM);  b.) s/p DCCV (100 J) 10/27/2010; c.) s/p ablation 09/21/2011; d.) rate/rhythm maintained on oral amiodarone + carvedilol; chronically anticoagulated with warfarin   CAD (coronary artery disease) 07/15/2010   a.) LHC 07/15/2010: 30% pLAD, 40% D1, 25%/30% pLCx, 30% mLCx - med mgmt   CHF (congestive heart failure) - NYHA Class II/III 2013   a.) TTE 02/26/2013: EF 50%, mild [name]/TR; b.) EST 06/13/2017: EF 50%, mild LCH, triv [name]/TR; c.) TTTE 03/24/2020: EF >55%, mild LVH, mild MAC, mild BAE, triv PR, mild [name]/TR, G1DD; d.) TTE 11/19/2021: EF 60-65%, mod reduced RVSF, mild LAE, mod MAC, Ao root 37 mm, G2DD   COPD (chronic obstructive pulmonary disease) (HCC)    Diabetes type 2, uncontrolled    declines DSME   Dilated cardiomyopathy (HCC) 2013   a.) EF 25% at time of Dx in 2013; b.) TTE 02/26/2013: EF 50%; c.) EST 06/13/2017: EF 50%;  d.) TTE 03/24/2020: EF >55%; e.) TTE 11/19/2021: EF 60-65%   Ex-smoker    GERD (gastroesophageal reflux disease)    Hepatic steatosis    Hyperlipidemia    Hypertension    Infrarenal abdominal aortic aneurysm (AAA) without rupture (HCC) 08/11/2016   a.) CT abd/pel 08/11/2016: 3.3 cm; b.) CT abd/pel 12/22/2018: 3.3 cm; c.) CT abd/pel 08/31/2021: 3.7 cm; d.) CT abd/pel 12/28/2021:  3.9 cm   Long term current use of amiodarone 2023   a.) previously on dranaderone (cost prohibitive); switched to amiodarone 2023   Long term current use of anticoagulant    a.) warfarin   Marijuana use    a.) UDS (+) of THC x 3 (02/06/2013, 06/29/2013, 09/03/2013); PCP advised "one more and we will stop prescribing ativan (08/2013)"   Migraine    Narcolepsy    Obesity    OSA (obstructive sleep apnea)    a.) unable  to tolerate nocturnal PAP therapy; uses supplemetal oxygen at 2L/[place] during sleep   Seasonal allergies    Umbilical hernia     Current Outpatient Medications  Medication Sig Dispense Refill   ACCU-CHEK FASTCLIX LANCETS MISC Check blood sugar once daily and as instructed. Dx 250.00 100 each 3   acetaminophen (TYLENOL) 500 MG tablet Take 2 tablets (1,000 mg total) by mouth every 6 (six) hours as needed for mild pain.     albuterol (VENTOLIN HFA) 108 (90 Base) MCG/ACT inhaler Inhale 2 puffs into the lungs every 6 (six) hours as needed for wheezing or shortness of breath. 8.5 each 6   amiodarone (PACERONE) 200 MG tablet Take 1 tablet (200 mg total) by mouth daily. 90 tablet 3   Blood Glucose Monitoring Suppl (BLOOD GLUCOSE MONITOR SYSTEM) W/DEVICE KIT by Does not apply route. Use to check sugar once daily and as needed Dx: E11.9 **ONE TOUCH VERIO**     Budeson-Glycopyrrol-Formoterol (BREZTRI AEROSPHERE) 160-9-4.8 MCG/ACT AERO Inhale 2 puffs into the lungs 2 (two) times daily. 32.1 g 3   carvedilol (COREG) 6.25 MG tablet TAKE 1 TABLET BY MOUTH TWICE A DAY WITH FOOD 180 tablet 1   Cholecalciferol (VITAMIN D) 50 MCG (2000 UT) CAPS Take 1 capsule (2,000 Units total) by mouth daily. 30 capsule    dapagliflozin propanediol (FARXIGA) 10 MG TABS tablet Take 1 tablet (10 mg total) by mouth daily before breakfast. 90 tablet 3   Docusate Calcium (STOOL SOFTENER PO) Take 1 tablet by mouth daily.     Fe Fum-Fe Poly-Vit C-Lactobac (FUSION PO) Take 1 tablet by mouth every other day. Patient states  received sample.  Unsure of mg.     FLUoxetine (PROZAC) 40 MG capsule TAKE 1 CAPSULE (40 MG TOTAL) BY MOUTH DAILY. 30 capsule 8   fluticasone (FLONASE) 50 MCG/ACT nasal spray Place 2 sprays into both nostrils daily. (Patient taking differently: Place 2 sprays into both nostrils as needed.) 48 mL 3   furosemide (LASIX) 80 MG tablet TAKE 1 TABLET BY MOUTH 2 TIMES DAILY. 180 tablet 4   ipratropium-albuterol (DUONEB) 0.5-2.5 (3) MG/3ML SOLN TAKE 3 MLS BY NEBULIZATION EVERY 6 (SIX) HOURS AS NEEDED (SEVERE SHORTNESS OF BREATH/WHEEZING). 360 mL 0   metFORMIN (GLUCOPHAGE) 500 MG tablet Take 1 tablet (500 mg total) by mouth 2 (two) times daily with a meal. 180 tablet 1   methocarbamol (ROBAXIN) 500 MG tablet Take 1 tablet (500 mg total) by mouth 3 (three) times daily as needed for muscle spasms (sedation precautions). 30 tablet 0   montelukast (SINGULAIR) 10 MG tablet TAKE 1 TABLET BY MOUTH EVERYDAY AT BEDTIME 90 tablet 3   ondansetron (ZOFRAN) 4 MG tablet Take 1 tablet (4 mg total) by mouth daily as needed for nausea or vomiting. 30 tablet 1   ONETOUCH VERIO test strip CHECK BLOOD SUGAR 3 TIMES A DAY 300 strip 3   OVER THE COUNTER MEDICATION Take 1 tablet by mouth daily. Superbeets     OVER THE COUNTER MEDICATION Take 1 capsule by mouth daily at 6 (six) AM. [place] sea moss     OXYGEN Inhale 2 L into the lungs at bedtime.     pantoprazole (PROTONIX) 40 MG tablet TAKE 1 TABLET (40 MG TOTAL) BY MOUTH TWICE A DAY BEFORE MEALS 180 tablet 4   potassium chloride (MICRO-K) 10 MEQ CR capsule Take 1 capsule (10 mEq total) by mouth daily. 90 capsule 3   Probiotic Product (PROBIOTIC DAILY PO) Take 1 tablet by mouth daily.  Saw Palmetto 450 MG CAPS Take 1 capsule (450 mg total) by mouth daily.  0   simvastatin (ZOCOR) 20 MG tablet TAKE 1 TABLET BY MOUTH EVERY DAY IN THE EVENING 90 tablet 1   triamcinolone cream (KENALOG) 0.1 % Apply 1 Application topically 2 (two) times daily. For no more than 10 days at a time 45 g  0   Turmeric (QC TUMERIC COMPLEX PO) Take 1 tablet by mouth daily at 6 (six) AM.     vitamin B-12 (CYANOCOBALAMIN) 1000 MCG tablet Take 1 tablet (1,000 mcg total) by mouth every Monday, Wednesday, and Friday.     warfarin (COUMADIN) 5 MG tablet TAKE 1/2 TABLET BY MOUTH DAILY EXCEPT TAKE 1 TABLET ON MONDAYS, WEDNESDAYS AND FRIDAYS OR AS DIRECTED BY ANTICOAGULATION CLINIC (Patient taking differently: Take 2.5-5 mg by mouth daily. TAKE 1/2 TABLET BY MOUTH DAILY EXCEPT TAKE 1 TABLET ON MONDAYS, WEDNESDAYS AND FRIDAYS OR AS DIRECTED BY ANTICOAGULATION CLINIC) 60 tablet 1   No current facility-administered medications for this visit.      PHYSICAL EXAM:  General:  Well appearing. No resp difficulty HEENT: normal Neck: supple. JVP flat. Carotids 2+ bilaterally; no bruits. No lymphadenopathy or thryomegaly appreciated. Cor: PMI normal. Regular rate & rhythm. No rubs, gallops or murmurs. Lungs: clear Abdomen: soft, nontender, nondistended. No hepatosplenomegaly. No bruits or masses. Good bowel sounds. Extremities: no cyanosis, clubbing, rash, edema Neuro: alert & orientedx3, cranial nerves grossly intact. Moves all 4 extremities w/o difficulty. Affect pleasant.   ECG:   ASSESSMENT & PLAN:  1: Chronic heart failure with preserved ejection fraction with structural changes (LVH/LAE)- - suspect  - NYHA class II - euvolemic today - weighing daily; reminded to call for an overnight weight gain of > 2 pounds or a weekly weight gain of > 5 pounds - weight 237.2 pounds from last visit here 6 months ago - Echo 11/19/21: EF of 60-65% along with mild LVH/ LAE. - not adding salt and is reading food labels for sodium content & is trying to lose weight by eating less and eating healthier - reviewed the importance of keeping daily fluid intake to 60 ounces / day; he's currently drinking 2 pots of coffee, 16 ounces diet tea and 20 ounces of snapple daily - saw cardiology Madaline Guthrie) 04/24 - continue farxiga   - discussed entresto but concerned that he's currently feeling dizzy/ off balance with blurry vision; consider at future visits - may need neurology referral; he will discuss with PCP - BNP 02/22/22 was 130.6  2: HTN- - BP  - saw PCP Sharen Hones) 05/24 - BMP 08/05/22 reviewed and showed sodium 139, potassium 3.3, creatinine 1.10   3: DM- - A1c 07/26/22 was 6.0%  4: Atrial fibrillation- - saw EP Lalla Brothers) 05/23 - taking amiodarone, carvedilol and warfarin  5: COPD- - wearing oxygen at 2L at bedtime and any time he naps - has CPAP but says that it's old and doesn't work right - saw pulmonology (Dgayli) 09/23  6: Anemia- - Hg 05/19/22 was 10.2 - reports oral iron makes him constipated - taking B12 3 times/ week - saw GI Tobi Bastos) 07/24 - saw hematology Alena Bills) 06/24

## 2022-11-12 DIAGNOSIS — H401123 Primary open-angle glaucoma, left eye, severe stage: Secondary | ICD-10-CM | POA: Diagnosis not present

## 2022-11-12 DIAGNOSIS — H401111 Primary open-angle glaucoma, right eye, mild stage: Secondary | ICD-10-CM | POA: Diagnosis not present

## 2022-11-14 DIAGNOSIS — J9601 Acute respiratory failure with hypoxia: Secondary | ICD-10-CM | POA: Diagnosis not present

## 2022-11-14 DIAGNOSIS — G4733 Obstructive sleep apnea (adult) (pediatric): Secondary | ICD-10-CM | POA: Diagnosis not present

## 2022-11-15 ENCOUNTER — Encounter: Payer: Self-pay | Admitting: Family Medicine

## 2022-11-15 ENCOUNTER — Ambulatory Visit (INDEPENDENT_AMBULATORY_CARE_PROVIDER_SITE_OTHER): Payer: Medicare Other | Admitting: Family Medicine

## 2022-11-15 VITALS — BP 126/80 | HR 55 | Temp 97.2°F | Ht 71.0 in | Wt 225.4 lb

## 2022-11-15 DIAGNOSIS — E1169 Type 2 diabetes mellitus with other specified complication: Secondary | ICD-10-CM

## 2022-11-15 DIAGNOSIS — I5032 Chronic diastolic (congestive) heart failure: Secondary | ICD-10-CM

## 2022-11-15 DIAGNOSIS — F410 Panic disorder [episodic paroxysmal anxiety] without agoraphobia: Secondary | ICD-10-CM

## 2022-11-15 DIAGNOSIS — I4819 Other persistent atrial fibrillation: Secondary | ICD-10-CM

## 2022-11-15 DIAGNOSIS — R42 Dizziness and giddiness: Secondary | ICD-10-CM

## 2022-11-15 DIAGNOSIS — G4733 Obstructive sleep apnea (adult) (pediatric): Secondary | ICD-10-CM | POA: Diagnosis not present

## 2022-11-15 DIAGNOSIS — J9612 Chronic respiratory failure with hypercapnia: Secondary | ICD-10-CM | POA: Diagnosis not present

## 2022-11-15 DIAGNOSIS — Z7901 Long term (current) use of anticoagulants: Secondary | ICD-10-CM | POA: Diagnosis not present

## 2022-11-15 DIAGNOSIS — R319 Hematuria, unspecified: Secondary | ICD-10-CM

## 2022-11-15 DIAGNOSIS — J9611 Chronic respiratory failure with hypoxia: Secondary | ICD-10-CM | POA: Diagnosis not present

## 2022-11-15 DIAGNOSIS — D500 Iron deficiency anemia secondary to blood loss (chronic): Secondary | ICD-10-CM | POA: Diagnosis not present

## 2022-11-15 DIAGNOSIS — D649 Anemia, unspecified: Secondary | ICD-10-CM

## 2022-11-15 DIAGNOSIS — R0609 Other forms of dyspnea: Secondary | ICD-10-CM

## 2022-11-15 LAB — POC URINALSYSI DIPSTICK (AUTOMATED)
Bilirubin, UA: NEGATIVE
Blood, UA: NEGATIVE
Glucose, UA: POSITIVE — AB
Ketones, UA: NEGATIVE
Leukocytes, UA: NEGATIVE
Nitrite, UA: NEGATIVE
Protein, UA: NEGATIVE
Spec Grav, UA: 1.015 (ref 1.010–1.025)
Urobilinogen, UA: 0.2 E.U./dL
pH, UA: 6 (ref 5.0–8.0)

## 2022-11-15 LAB — POCT GLYCOSYLATED HEMOGLOBIN (HGB A1C): Hemoglobin A1C: 6.1 % — AB (ref 4.0–5.6)

## 2022-11-15 MED ORDER — METHOCARBAMOL 500 MG PO TABS: 500.0000 mg | ORAL_TABLET | Freq: Three times a day (TID) | ORAL | 0 refills | Status: DC | PRN

## 2022-11-15 NOTE — Patient Instructions (Addendum)
I think dizziness leading to anxiety attacks is caused by anemia or low blood counts which can cause dizziness/shortness of breath.  Urinalysis today to ensure no blood in urine. Call [facility] at [phone] to discuss repeat capsule endoscopy.  Methocarbamol muscle relaxant refilled.  A1c today.  I will order 2 week heart monitor to check how much afib you're in.  Return in 2-3 months for follow up visit

## 2022-11-15 NOTE — Progress Notes (Unsigned)
Ph: [phone] Fax: [phone]   Patient ID: [name], male    DOB: [birth year], 67 y.o.   MRN: [id number]  This visit was conducted in person.  BP 126/80   Pulse (!) 55   Temp (!) 97.2 F (36.2 C) (Temporal)   Ht 5\' 11"  (1.803 m)   Wt 225 lb 6 oz (102.2 kg)   SpO2 96%   BMI 31.43 kg/m   Orthostatic VS for the past 72 hrs (Last 3 readings):  Orthostatic BP Patient Position  11/15/22 1118 (!) 138/100 Standing  11/15/22 1115 138/80 Supine     CC: discuss panic attacks Subjective:   HPI: [name] is a 67 y.o. male presenting on 11/15/2022 for Panic Attack (C/o panic attacks. Also, c/o vertigo episode. Wants to discuss neuro referral. )   Known IDA followed by GI and heme s/p IV iron infusion. Labs recently showed recurrent iron deficiency anemia - referred back to GI - again recommendation was capsule endoscopy however he has continued to decline this. Rpt iron infusion Feraheme x2 this month. Oral iron stays on hold.   He had inconclusive capsule endoscopy done by [facility] 09/2021 - poor visualization of stomach due to food content, small bowel not visualized. Decided to cancel capsule endoscopy through [facility] last month. Discussed reasoning for recommended rpt capsule endoscopy.   Known chronic respiratory failure with both hypoxia and hypercapnia thought multifactorial including COPD, dCHF, untreated sleep apnea (unable to tolerate CPAP or BiPAP). He continues nocturnal supplemental oxygen use.   Notes worsening dizziness - describes cloudy and blurry vision that affects balance even with walking. No black spots or flashing lights.  Notes orthostatic lightheadedness.  Has had falls, fortunately no injury.  Saw eye doctor - told eyes were normal.  No syncope or seizure. No vertigo.   DM - back on glimepiride 1mg  daily in addition to farxiga 10mg  daily, metformin 500mg  bid.  Lab Results  Component Value Date   HGBA1C 6.1 (A) 11/15/2022    Continues coumadin in h/o parox atrial fibrillation/flutter.  Lab Results  Component Value Date   INR 2.5 10/21/2022   INR 3.3 (A) 10/06/2022   INR 2.8 09/30/2022       Relevant past medical, surgical, family and social history reviewed and updated as indicated. Interim medical history since our last visit reviewed. Allergies and medications reviewed and updated. Outpatient Medications Prior to Visit  Medication Sig Dispense Refill   ACCU-CHEK FASTCLIX LANCETS MISC Check blood sugar once daily and as instructed. Dx 250.00 100 each 3   acetaminophen (TYLENOL) 500 MG tablet Take 2 tablets (1,000 mg total) by mouth every 6 (six) hours as needed for mild pain.     albuterol (VENTOLIN HFA) 108 (90 Base) MCG/ACT inhaler Inhale 2 puffs into the lungs every 6 (six) hours as needed for wheezing or shortness of breath. 8.5 each 6   amiodarone (PACERONE) 200 MG tablet Take 1 tablet (200 mg total) by mouth daily. 90 tablet 3   Blood Glucose Monitoring Suppl (BLOOD GLUCOSE MONITOR SYSTEM) W/DEVICE KIT by Does not apply route. Use to check sugar once daily and as needed Dx: E11.9 **ONE TOUCH VERIO**     Budeson-Glycopyrrol-Formoterol (BREZTRI AEROSPHERE) 160-9-4.8 MCG/ACT AERO Inhale 2 puffs into the lungs 2 (two) times daily. 32.1 g 3   carvedilol (COREG) 6.25 MG tablet TAKE 1 TABLET BY MOUTH TWICE A DAY WITH FOOD 180 tablet 1   Cholecalciferol (VITAMIN D) 50 MCG (2000 UT) CAPS  Take 1 capsule (2,000 Units total) by mouth daily. 30 capsule    dapagliflozin propanediol (FARXIGA) 10 MG TABS tablet Take 1 tablet (10 mg total) by mouth daily before breakfast. 90 tablet 3   Docusate Calcium (STOOL SOFTENER PO) Take 1 tablet by mouth daily.     Fe Fum-Fe Poly-Vit C-Lactobac (FUSION PO) Take 1 tablet by mouth every other day. Patient states received sample.  Unsure of mg.     FLUoxetine (PROZAC) 40 MG capsule TAKE 1 CAPSULE (40 MG TOTAL) BY MOUTH DAILY. 30 capsule 8   fluticasone (FLONASE) 50 MCG/ACT nasal  spray Place 2 sprays into both nostrils daily. (Patient taking differently: Place 2 sprays into both nostrils as needed.) 48 mL 3   furosemide (LASIX) 80 MG tablet TAKE 1 TABLET BY MOUTH 2 TIMES DAILY. 180 tablet 4   ipratropium-albuterol (DUONEB) 0.5-2.5 (3) MG/3ML SOLN TAKE 3 MLS BY NEBULIZATION EVERY 6 (SIX) HOURS AS NEEDED (SEVERE SHORTNESS OF BREATH/WHEEZING). 360 mL 0   metFORMIN (GLUCOPHAGE) 500 MG tablet Take 1 tablet (500 mg total) by mouth 2 (two) times daily with a meal. 180 tablet 1   montelukast (SINGULAIR) 10 MG tablet TAKE 1 TABLET BY MOUTH EVERYDAY AT BEDTIME 90 tablet 3   ondansetron (ZOFRAN) 4 MG tablet Take 1 tablet (4 mg total) by mouth daily as needed for nausea or vomiting. 30 tablet 1   ONETOUCH VERIO test strip CHECK BLOOD SUGAR 3 TIMES A DAY 300 strip 3   OVER THE COUNTER MEDICATION Take 1 tablet by mouth daily. Superbeets     OVER THE COUNTER MEDICATION Take 1 capsule by mouth daily at 6 (six) AM. [place] sea moss     OXYGEN Inhale 2 L into the lungs at bedtime.     pantoprazole (PROTONIX) 40 MG tablet TAKE 1 TABLET (40 MG TOTAL) BY MOUTH TWICE A DAY BEFORE MEALS 180 tablet 4   potassium chloride (MICRO-K) 10 MEQ CR capsule Take 1 capsule (10 mEq total) by mouth daily. 90 capsule 3   Probiotic Product (PROBIOTIC DAILY PO) Take 1 tablet by mouth daily.     Saw Palmetto 450 MG CAPS Take 1 capsule (450 mg total) by mouth daily.  0   simvastatin (ZOCOR) 20 MG tablet TAKE 1 TABLET BY MOUTH EVERY DAY IN THE EVENING 90 tablet 1   triamcinolone cream (KENALOG) 0.1 % Apply 1 Application topically 2 (two) times daily. For no more than 10 days at a time 45 g 0   Turmeric (QC TUMERIC COMPLEX PO) Take 1 tablet by mouth daily at 6 (six) AM.     vitamin B-12 (CYANOCOBALAMIN) 1000 MCG tablet Take 1 tablet (1,000 mcg total) by mouth every Monday, Wednesday, and Friday.     warfarin (COUMADIN) 5 MG tablet TAKE 1/2 TABLET BY MOUTH DAILY EXCEPT TAKE 1 TABLET ON MONDAYS, WEDNESDAYS AND FRIDAYS  OR AS DIRECTED BY ANTICOAGULATION CLINIC (Patient taking differently: Take 2.5-5 mg by mouth daily. TAKE 1/2 TABLET BY MOUTH DAILY EXCEPT TAKE 1 TABLET ON MONDAYS, WEDNESDAYS AND FRIDAYS OR AS DIRECTED BY ANTICOAGULATION CLINIC) 60 tablet 1   methocarbamol (ROBAXIN) 500 MG tablet Take 1 tablet (500 mg total) by mouth 3 (three) times daily as needed for muscle spasms (sedation precautions). 30 tablet 0   No facility-administered medications prior to visit.     Per HPI unless specifically indicated in ROS section below Review of Systems  Objective:  BP 126/80   Pulse (!) 55   Temp (!) 97.2 F (36.2  C) (Temporal)   Ht 5\' 11"  (1.803 m)   Wt 225 lb 6 oz (102.2 kg)   SpO2 96%   BMI 31.43 kg/m   Wt Readings from Last 3 Encounters:  11/15/22 225 lb 6 oz (102.2 kg)  11/04/22 218 lb 6.4 oz (99.1 kg)  10/22/22 224 lb 9.6 oz (101.9 kg)      Physical Exam Vitals and nursing note reviewed.  Constitutional:      Appearance: Normal appearance. He is not ill-appearing.  HENT:     Mouth/Throat:     Mouth: Mucous membranes are moist.     Pharynx: Oropharynx is clear. No oropharyngeal exudate or posterior oropharyngeal erythema.  Eyes:     Extraocular Movements: Extraocular movements intact.     Pupils: Pupils are equal, round, and reactive to light.  Cardiovascular:     Rate and Rhythm: Normal rate and regular rhythm.     Pulses: Normal pulses.     Heart sounds: Murmur (2/6 systolic) heard.  Pulmonary:     Effort: Pulmonary effort is normal. No respiratory distress.     Breath sounds: No wheezing, rhonchi or rales.     Comments: Crackles bibasilarly Musculoskeletal:        General: Swelling present.     Right lower leg: Edema present.     Left lower leg: Edema present.  Skin:    Findings: Rash present.     Comments: Annular rash to left lower medial leg with scaly rolling borders   Neurological:     Mental Status: He is alert.     Comments:  CN 2-12 intact FTN intact EOMI    Psychiatric:        Mood and Affect: Mood normal.        Behavior: Behavior normal.       Results for orders placed or performed in visit on 11/15/22  POCT glycosylated hemoglobin (Hb A1C)  Result Value Ref Range   Hemoglobin A1C 6.1 (A) 4.0 - 5.6 %   HbA1c POC (<> result, manual entry)     HbA1c, POC (prediabetic range)     HbA1c, POC (controlled diabetic range)    POCT Urinalysis Dipstick (Automated)  Result Value Ref Range   Color, UA yellow    Clarity, UA clear    Glucose, UA Positive (A) Negative   Bilirubin, UA negative    Ketones, UA negative    Spec Grav, UA 1.015 1.010 - 1.025   Blood, UA negative    pH, UA 6.0 5.0 - 8.0   Protein, UA Negative Negative   Urobilinogen, UA 0.2 0.2 or 1.0 E.U./dL   Nitrite, UA negative    Leukocytes, UA Negative Negative   Lab Results  Component Value Date   WBC 9.9 10/22/2022   HGB 8.0 (L) 10/22/2022   HCT 26.4 (L) 10/22/2022   MCV 82.0 10/22/2022   PLT 236 10/22/2022   Lab Results  Component Value Date   NA 139 08/05/2022   CL 92 (L) 08/05/2022   K 3.3 (L) 08/05/2022   CO2 36 (H) 07/26/2022   BUN 15 08/05/2022   CREATININE 1.10 08/05/2022   GFR 46.60 (L) 07/26/2022   CALCIUM 9.9 07/26/2022   PHOS 3.0 08/04/2021   ALBUMIN 4.3 07/26/2022   GLUCOSE 156 (H) 08/05/2022    Assessment & Plan:   Problem List Items Addressed This Visit     Obstructive sleep apnea    Severe, has declined treatment. Not Inspire candidate.  He does continues nocturnal  oxygen supplementation.       Anxiety attack    This comes from dizziness/dyspnea presumed from progressive anemia. See below.       Type 2 diabetes mellitus with other specified complication (HCC)    Chronic, stable on current regimen - continue farxiga, metformin, glimepiride.       Relevant Orders   POCT glycosylated hemoglobin (Hb A1C) (Completed)   Exertional dyspnea    Chronic, multifactorial (COPD, CHF, anemia, OSA), contributes to anxiety attacks.        Relevant Orders   LONG TERM MONITOR (3-14 DAYS)   Chronic diastolic heart failure (HCC)    Continues lasix 80mg  BID as well as farxiga      Iron deficiency anemia    Chronic, recent deterioration. Has had iron infusions, latest Feraheme x2 this month. Presumed slow chronic GI blood loss.  UA today without blood.  He saw Dr Tobi [facility] recently this month, declined recommended capsule endoscopy. Reviewed reasoning behind repeating capsule endoscopy in h/o inconclusive capsule endoscopy last year through [facility].  He agrees to proceed with this. # provided to call [facility] to schedule.  Upcoming heme follow up scheduled next week.       Relevant Orders   POCT Urinalysis Dipstick (Automated) (Completed)   Atrial fibrillation (HCC)    CHA2DS2VASc score = 5.  On warfarin for atrial fibrillation, sees our office for coumadin clinic.  Continues amiodarone and carvedilol through cardiology.  In symptomatic IDA with presumed GI bleed, he agrees to proceed with capsule endoscopy.  Sounds regular when auscultated in office.  I will order 2 wk Zio patch holter monitor to assess afib burden. If 0%, consider d/c warfarin if cardiology agrees.       Relevant Orders   LONG TERM MONITOR (3-14 DAYS)   Chronic respiratory failure with hypoxia and hypercapnia (HCC)   Symptomatic anemia   Long-term (current) use of anticoagulants, INR goal 2.0-3.0    See below for [facility] plan      Dizziness - Primary    Anticipate dizziness stemming from anemia from presumed GI source of blood loss. This contributes to anxiety attacks. Story/exam not consistent with vertigo.  Orthostatics negative in office today.       Relevant Orders   LONG TERM MONITOR (3-14 DAYS)     Meds ordered this encounter  Medications   methocarbamol (ROBAXIN) 500 MG tablet    Sig: Take 1 tablet (500 mg total) by mouth 3 (three) times daily as needed for muscle spasms (sedation precautions).    Dispense:  30 tablet    Refill:  0     Orders Placed This Encounter  Procedures   LONG TERM MONITOR (3-14 DAYS)    Sees [facility] clinic cardiology Dr Kowalski/Paraschos    Standing Status:   Future    Number of Occurrences:   1    Standing Expiration Date:   11/17/2023    Order Specific Question:   Where should this test be performed?    Answer:   External    Order Specific Question:   Does the patient have an implanted cardiac device?    Answer:   No    Order Specific Question:   Prescribed days of wear    Answer:   65    Order Specific Question:   Type of enrollment    Answer:   Home Enrollment    Order Specific Question:   Vendor:    Answer:   [employer]  POCT glycosylated hemoglobin (Hb A1C)   POCT Urinalysis Dipstick (Automated)    Patient Instructions  I think dizziness leading to anxiety attacks is caused by anemia or low blood counts which can cause dizziness/shortness of breath.  Urinalysis today to ensure no blood in urine. Call [facility] at [phone] to discuss repeat capsule endoscopy.  Methocarbamol muscle relaxant refilled.  A1c today.  I will order 2 week heart monitor to check how much afib you're in.  Return in 2-3 months for follow up visit   Follow up plan: Return in about 3 months (around 02/15/2023) for follow up visit.  Eustaquio Boyden, MD

## 2022-11-17 ENCOUNTER — Ambulatory Visit: Payer: Medicare Other | Attending: Family Medicine

## 2022-11-17 DIAGNOSIS — R0609 Other forms of dyspnea: Secondary | ICD-10-CM

## 2022-11-17 DIAGNOSIS — I4819 Other persistent atrial fibrillation: Secondary | ICD-10-CM

## 2022-11-17 NOTE — Assessment & Plan Note (Addendum)
Chronic, recent deterioration. Has had iron infusions, latest Feraheme x2 this month. Presumed slow chronic GI blood loss.  UA today without blood.  He saw Dr Tobi [facility] recently this month, declined recommended capsule endoscopy. Reviewed reasoning behind repeating capsule endoscopy in h/o inconclusive capsule endoscopy last year through [facility].  He agrees to proceed with this. # provided to call [facility] to schedule.  Upcoming heme follow up scheduled next week.

## 2022-11-17 NOTE — Assessment & Plan Note (Signed)
See below for [facility] plan

## 2022-11-17 NOTE — Assessment & Plan Note (Addendum)
CHA2DS2VASc score = 5.  On warfarin for atrial fibrillation, sees our office for coumadin clinic.  Continues amiodarone and carvedilol through cardiology.  In symptomatic IDA with presumed GI bleed, he agrees to proceed with capsule endoscopy.  Sounds regular when auscultated in office.  I will order 2 wk Zio patch holter monitor to assess afib burden. If 0%, consider d/c warfarin if cardiology agrees.

## 2022-11-17 NOTE — Assessment & Plan Note (Signed)
Continues lasix 80mg  BID as well as farxiga

## 2022-11-17 NOTE — Assessment & Plan Note (Addendum)
Severe, has declined treatment. Not Inspire candidate.  He does continues nocturnal oxygen supplementation.

## 2022-11-17 NOTE — Assessment & Plan Note (Signed)
This comes from dizziness/dyspnea presumed from progressive anemia. See below.

## 2022-11-17 NOTE — Assessment & Plan Note (Signed)
Chronic, stable on current regimen - continue farxiga, metformin, glimepiride.

## 2022-11-17 NOTE — Assessment & Plan Note (Signed)
Chronic, multifactorial (COPD, CHF, anemia, OSA), contributes to anxiety attacks.

## 2022-11-17 NOTE — Assessment & Plan Note (Addendum)
Anticipate dizziness stemming from anemia from presumed GI source of blood loss. This contributes to anxiety attacks. Story/exam not consistent with vertigo.  Orthostatics negative in office today.

## 2022-11-18 ENCOUNTER — Ambulatory Visit (INDEPENDENT_AMBULATORY_CARE_PROVIDER_SITE_OTHER): Payer: Medicare Other

## 2022-11-18 DIAGNOSIS — Z7901 Long term (current) use of anticoagulants: Secondary | ICD-10-CM | POA: Diagnosis not present

## 2022-11-18 LAB — POCT INR: INR: 2.6 (ref 2.0–3.0)

## 2022-11-18 MED FILL — Ferumoxytol Inj 510 MG/17ML (30 MG/ML) (Elemental Fe): INTRAVENOUS | Qty: 17 | Status: AC

## 2022-11-18 NOTE — Progress Notes (Signed)
Continue 1 tablet daily except take 1/2 tablet on Tuesday and Saturday.  Recheck in 4 weeks.

## 2022-11-18 NOTE — Patient Instructions (Addendum)
Pre visit review using our clinic review tool, if applicable. No additional management support is needed unless otherwise documented below in the visit note.  Continue 1 tablet daily except take 1/2 tablet on Tuesday and Saturday.  Recheck in 4 weeks.

## 2022-11-19 ENCOUNTER — Inpatient Hospital Stay: Payer: Medicare Other | Admitting: Oncology

## 2022-11-19 ENCOUNTER — Inpatient Hospital Stay: Payer: Medicare Other

## 2022-11-19 ENCOUNTER — Ambulatory Visit: Payer: Medicare Other | Admitting: Internal Medicine

## 2022-11-19 ENCOUNTER — Encounter: Payer: Self-pay | Admitting: Oncology

## 2022-11-19 VITALS — BP 145/85 | HR 52 | Temp 96.7°F | Resp 18 | Ht 71.0 in | Wt 224.9 lb

## 2022-11-19 DIAGNOSIS — D509 Iron deficiency anemia, unspecified: Secondary | ICD-10-CM

## 2022-11-19 LAB — IRON AND TIBC
Iron: 49 ug/dL (ref 45–182)
Saturation Ratios: 14 % — ABNORMAL LOW (ref 17.9–39.5)
TIBC: 340 ug/dL (ref 250–450)
UIBC: 291 ug/dL

## 2022-11-19 LAB — CBC WITH DIFFERENTIAL (CANCER CENTER ONLY)
Abs Immature Granulocytes: 0.03 10*3/uL (ref 0.00–0.07)
Basophils Absolute: 0.1 10*3/uL (ref 0.0–0.1)
Basophils Relative: 1 %
Eosinophils Absolute: 0.1 10*3/uL (ref 0.0–0.5)
Eosinophils Relative: 2 %
HCT: 32 % — ABNORMAL LOW (ref 39.0–52.0)
Hemoglobin: 9.7 g/dL — ABNORMAL LOW (ref 13.0–17.0)
Immature Granulocytes: 0 %
Lymphocytes Relative: 19 %
Lymphs Abs: 1.5 10*3/uL (ref 0.7–4.0)
MCH: 26.5 pg (ref 26.0–34.0)
MCHC: 30.3 g/dL (ref 30.0–36.0)
MCV: 87.4 fL (ref 80.0–100.0)
Monocytes Absolute: 0.8 10*3/uL (ref 0.1–1.0)
Monocytes Relative: 10 %
Neutro Abs: 5.6 10*3/uL (ref 1.7–7.7)
Neutrophils Relative %: 68 %
Platelet Count: 241 10*3/uL (ref 150–400)
RBC: 3.66 MIL/uL — ABNORMAL LOW (ref 4.22–5.81)
RDW: 20.3 % — ABNORMAL HIGH (ref 11.5–15.5)
WBC Count: 8.2 10*3/uL (ref 4.0–10.5)
nRBC: 0 % (ref 0.0–0.2)

## 2022-11-19 LAB — FERRITIN: Ferritin: 46 ng/mL (ref 24–336)

## 2022-11-19 NOTE — Progress Notes (Signed)
Hematology/Oncology Consult note [facility]  [phone] [phone]  Patient Care Team: Eustaquio Boyden, MD as PCP - General (Family Medicine) Lanier Prude, MD as PCP - Electrophysiology (Cardiology) Hillis Range, MD (Inactive) as Attending Physician (Cardiology) Lamar Blinks, MD as Referring Physician (Internal Medicine) Kathyrn Sheriff, [facility] (Inactive) as Pharmacist (Pharmacist) Creig Hines, MD as Consulting Physician (Oncology) Creig Hines, MD as Consulting Physician (Oncology) Otho Ket, RN as [facility] Care Management   Name of the patient: [name]  [id number]  Aug 29, 1955   Date of visit: 11/19/22  Diagnosis-iron deficiency anemia  Chief complaint/ Reason for visit-routine follow-up of iron deficiency anemia  Heme/Onc history: Patient is a 67 year old male with a past medical history significant for coronary artery disease, fatty liver, COPD among other medical problems who has been referred to [place] for iron deficiency anemia.  His most recent CBC from 07/15/2021 showed H&H of 10.3/35.2 with an MCV of 69 with a normal white count and platelet count.  Hemoglobin had drifted down from 14 a year ago to 12.74 months ago to 10.3 presently.  Iron studies showed a low iron saturation of 3.2% and elevated TIBC with a ferritin level of 6.4.  B12 levels were elevated at 1500.  TSH normal at 2.8.  Patient underwent EGD and colonoscopy by Dr. Servando Snare in 2022.  EGD showed no abnormality colonoscopy showed 4 mm ulcer in the descending colon which was biopsied and was negative for malignancy.  Capsule study at that time was inconclusive as the capsule did not pass through into his small bowel.  Patient had a repeat EGD and colonoscopy in January 2024 as well which was again unremarkable with no evidence of bleeding.  Interval history-patient reports improvement in his fatigue but is not feeling back to his  baseline.  He did report feeling better after receiving IV iron.  Denies any blood loss in his stool or urine.  ECOG PS- 1 Pain scale- 0   Review of systems- Review of Systems  Constitutional:  Positive for malaise/fatigue. Negative for chills, fever and weight loss.  HENT:  Negative for congestion, ear discharge and nosebleeds.   Eyes:  Negative for blurred vision.  Respiratory:  Negative for cough, hemoptysis, sputum production, shortness of breath and wheezing.   Cardiovascular:  Negative for chest pain, palpitations, orthopnea and claudication.  Gastrointestinal:  Negative for abdominal pain, blood in stool, constipation, diarrhea, heartburn, melena, nausea and vomiting.  Genitourinary:  Negative for dysuria, flank pain, frequency, hematuria and urgency.  Musculoskeletal:  Negative for back pain, joint pain and myalgias.  Skin:  Negative for rash.  Neurological:  Negative for dizziness, tingling, focal weakness, seizures, weakness and headaches.  Endo/Heme/Allergies:  Does not bruise/bleed easily.  Psychiatric/Behavioral:  Negative for depression and suicidal ideas. The patient does not have insomnia.       Allergies  Allergen Reactions   Atorvastatin Other (See Comments)    Dizziness   Lisinopril Cough     Past Medical History:  Diagnosis Date   Adrenal nodule (HCC) 12/28/2021   a.) CT abd/pel 12/28/2021: LEFT adrenal nodule measuring 1.6 cm   Anemia    Angina    Anxiety    Aortic atherosclerosis (HCC)    Arthritis    Atrial fibrillation and flutter (HCC) 2013   a.) CHA2DS2VASc = 5 (age, CHF, HTN, vascular disease history, T2DM);  b.) s/p DCCV (100 J) 10/27/2010; c.) s/p ablation 09/21/2011; d.) rate/rhythm  maintained on oral amiodarone + carvedilol; chronically anticoagulated with warfarin   CAD (coronary artery disease) 07/15/2010   a.) LHC 07/15/2010: 30% pLAD, 40% D1, 25%/30% pLCx, 30% mLCx - med mgmt   CHF (congestive heart failure) - NYHA Class II/III 2013   a.)  TTE 02/26/2013: EF 50%, mild MR/TR; b.) EST 06/13/2017: EF 50%, mild LCH, triv MR/TR; c.) TTTE 03/24/2020: EF >55%, mild LVH, mild MAC, mild BAE, triv PR, mild MR/TR, G1DD; d.) TTE 11/19/2021: EF 60-65%, mod reduced RVSF, mild LAE, mod MAC, Ao root 37 mm, G2DD   COPD (chronic obstructive pulmonary disease) (HCC)    Diabetes type 2, uncontrolled    declines DSME   Dilated cardiomyopathy (HCC) 2013   a.) EF 25% at time of Dx in 2013; b.) TTE 02/26/2013: EF 50%; c.) EST 06/13/2017: EF 50%;  d.) TTE 03/24/2020: EF >55%; e.) TTE 11/19/2021: EF 60-65%   Ex-smoker    GERD (gastroesophageal reflux disease)    Hepatic steatosis    Hyperlipidemia    Hypertension    Infrarenal abdominal aortic aneurysm (AAA) without rupture (HCC) 08/11/2016   a.) CT abd/pel 08/11/2016: 3.3 cm; b.) CT abd/pel 12/22/2018: 3.3 cm; c.) CT abd/pel 08/31/2021: 3.7 cm; d.) CT abd/pel 12/28/2021: 3.9 cm   Long term current use of amiodarone 2023   a.) previously on dranaderone (cost prohibitive); switched to amiodarone 2023   Long term current use of anticoagulant    a.) warfarin   Marijuana use    a.) UDS (+) of THC x 3 (02/06/2013, 06/29/2013, 09/03/2013); PCP advised "one more and we will stop prescribing ativan (08/2013)"   Migraine    Narcolepsy    Obesity    OSA (obstructive sleep apnea)    a.) unable to tolerate nocturnal PAP therapy; uses supplemetal oxygen at 2L/[place] during sleep   Seasonal allergies    Umbilical hernia      Past Surgical History:  Procedure Laterality Date   ATRIAL FLUTTER ABLATION N/A 09/21/2011   Procedure: ATRIAL FLUTTER ABLATION;  Surgeon: Hillis Range, MD;  Location: [facility] CATH LAB;  Service: Cardiovascular;  Laterality: N/A;   CARDIAC ELECTROPHYSIOLOGY MAPPING AND ABLATION  2013   for atrial flutter   CARDIOVASCULAR STRESS TEST  03/2012   ETT WNL Gwen Pounds)   CARDIOVERSION N/A 08/12/2021   Procedure: CARDIOVERSION;  Surgeon: Lamar Blinks, MD;  Location: [facility] ORS;  Service:  Cardiovascular;  Laterality: N/A;   COLONOSCOPY WITH PROPOFOL N/A 05/22/2020   TA, diverticulosis, descending colon ulcer biopsy WNL, int hem Servando Snare, Darren, MD)   COLONOSCOPY WITH PROPOFOL N/A 04/28/2022   multiple TAs - 10 small, 2 1.2cm, 2 9mm, rpt 3 yrs (Vanga, Loel Dubonnet, MD)   CYST EXCISION N/A 08/05/2022   excision of back cysts x 2 (pathology returned epidermal inclusion cysts);  Surgeon: Henrene Dodge, MD   ESOPHAGOGASTRODUODENOSCOPY (EGD) WITH PROPOFOL N/A 05/22/2020   WNL (Wohl)   ESOPHAGOGASTRODUODENOSCOPY (EGD) WITH PROPOFOL N/A 04/28/2022   12mm duodenal TA, reactive gastropathy, enteric mucosa in stomach biopsy of unclear significance (Vanga, Loel Dubonnet, MD)   GIVENS CAPSULE STUDY  04/28/2022   Procedure: GIVENS CAPSULE STUDY;  Surgeon: Toney Reil, MD;  Location: [facility] ENDOSCOPY;  Service: Endoscopy;;   US ECHOCARDIOGRAPHY  2014   EF 50%, nl LV fxn, RV nl size/function, mild mitral insuff    Social History   Socioeconomic History   Marital status: Single    Spouse name: Not on file   Number of children: 0   Years of  education: Not on file   Highest education level: 12th grade  Occupational History   Occupation: disable   Tobacco Use   Smoking status: Former    Current packs/day: 0.00    Average packs/day: 1 pack/day for 31.0 years (31.0 ttl pk-yrs)    Types: Cigarettes    Start date: 04/27/1987    Quit date: 04/26/2018    Years since quitting: 4.5    Passive exposure: Past   Smokeless tobacco: Never  Vaping Use   Vaping status: Never Used  Substance and Sexual Activity   Alcohol use: No    Alcohol/week: 0.0 standard drinks of alcohol   Drug use: Yes    Types: Marijuana   Sexual activity: Not Currently  Other Topics Concern   Not on file  Social History Narrative   Pt lives in [place]   Divorced   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   Disability due to neck arthritis, anxiety and sleep apnea   Edu: HS   Activity: no regular exercise, mows lawn  (riding and push)   Diet: good water, fruits/vegetables daily   Social Determinants of Health   Financial Resource Strain: Low Risk  (08/29/2022)   Overall Financial Resource Strain (CARDIA)    Difficulty of Paying Living Expenses: Not very hard  Food Insecurity: No Food Insecurity (08/29/2022)   Hunger Vital Sign    Worried About Running Out of Food in the Last Year: Never true    Ran Out of Food in the Last Year: Never true  Transportation Needs: No Transportation Needs (08/29/2022)   PRAPARE - Administrator, Civil Service (Medical): No    Lack of Transportation (Non-Medical): No  Recent Concern: Transportation Needs - Unmet Transportation Needs (08/13/2022)   PRAPARE - Transportation    Lack of Transportation (Medical): Yes    Lack of Transportation (Non-Medical): Yes  Physical Activity: Unknown (08/29/2022)   Exercise Vital Sign    Days of Exercise per Week: 0 days    Minutes of Exercise per Session: Not on file  Recent Concern: Physical Activity - Inactive (08/29/2022)   Exercise Vital Sign    Days of Exercise per Week: 0 days    Minutes of Exercise per Session: 0 min  Stress: No Stress Concern Present (08/29/2022)   [employer] of Occupational Health - Occupational Stress Questionnaire    Feeling of Stress : Not at all  Social Connections: Socially Isolated (08/29/2022)   Social Connection and Isolation Panel [NHANES]    Frequency of Communication with Friends and Family: More than three times a week    Frequency of Social Gatherings with Friends and Family: Never    Attends Religious Services: Never    Database administrator or Organizations: No    Attends Engineer, structural: Not on file    Marital Status: Divorced  Intimate Partner Violence: Not At Risk (07/09/2020)   Humiliation, Afraid, Rape, and Kick questionnaire    Fear of Current or Ex-Partner: No    Emotionally Abused: No    Physically Abused: No    Sexually Abused: No    Family History   Problem Relation Age of Onset   Heart failure Father 107   Cancer Mother 60       NHL   Hypertension Maternal Grandfather    CAD Maternal Grandfather 19       MI   Diabetes Other        grandparents   Cancer Brother 55  lung (smoker)     Current Outpatient Medications:    ACCU-CHEK FASTCLIX LANCETS MISC, Check blood sugar once daily and as instructed. Dx 250.00, Disp: 100 each, Rfl: 3   acetaminophen (TYLENOL) 500 MG tablet, Take 2 tablets (1,000 mg total) by mouth every 6 (six) hours as needed for mild pain., Disp: , Rfl:    albuterol (VENTOLIN HFA) 108 (90 Base) MCG/ACT inhaler, Inhale 2 puffs into the lungs every 6 (six) hours as needed for wheezing or shortness of breath., Disp: 8.5 each, Rfl: 6   amiodarone (PACERONE) 200 MG tablet, Take 1 tablet (200 mg total) by mouth daily., Disp: 90 tablet, Rfl: 3   Blood Glucose Monitoring Suppl (BLOOD GLUCOSE MONITOR SYSTEM) W/DEVICE KIT, by Does not apply route. Use to check sugar once daily and as needed Dx: E11.9 **ONE TOUCH VERIO**, Disp: , Rfl:    Budeson-Glycopyrrol-Formoterol (BREZTRI AEROSPHERE) 160-9-4.8 MCG/ACT AERO, Inhale 2 puffs into the lungs 2 (two) times daily., Disp: 32.1 g, Rfl: 3   carvedilol (COREG) 6.25 MG tablet, TAKE 1 TABLET BY MOUTH TWICE A DAY WITH FOOD, Disp: 180 tablet, Rfl: 1   Cholecalciferol (VITAMIN D) 50 MCG (2000 UT) CAPS, Take 1 capsule (2,000 Units total) by mouth daily., Disp: 30 capsule, Rfl:    dapagliflozin propanediol (FARXIGA) 10 MG TABS tablet, Take 1 tablet (10 mg total) by mouth daily before breakfast., Disp: 90 tablet, Rfl: 3   Docusate Calcium (STOOL SOFTENER PO), Take 1 tablet by mouth daily., Disp: , Rfl:    Fe Fum-Fe Poly-Vit C-Lactobac (FUSION PO), Take 1 tablet by mouth every other day. Patient states received sample.  Unsure of mg., Disp: , Rfl:    FLUoxetine (PROZAC) 40 MG capsule, TAKE 1 CAPSULE (40 MG TOTAL) BY MOUTH DAILY., Disp: 30 capsule, Rfl: 8   fluticasone (FLONASE) 50 MCG/ACT  nasal spray, Place 2 sprays into both nostrils daily. (Patient taking differently: Place 2 sprays into both nostrils as needed.), Disp: 48 mL, Rfl: 3   furosemide (LASIX) 80 MG tablet, TAKE 1 TABLET BY MOUTH 2 TIMES DAILY., Disp: 180 tablet, Rfl: 4   ipratropium-albuterol (DUONEB) 0.5-2.5 (3) MG/3ML SOLN, TAKE 3 MLS BY NEBULIZATION EVERY 6 (SIX) HOURS AS NEEDED (SEVERE SHORTNESS OF BREATH/WHEEZING)., Disp: 360 mL, Rfl: 0   metFORMIN (GLUCOPHAGE) 500 MG tablet, Take 1 tablet (500 mg total) by mouth 2 (two) times daily with a meal., Disp: 180 tablet, Rfl: 1   methocarbamol (ROBAXIN) 500 MG tablet, Take 1 tablet (500 mg total) by mouth 3 (three) times daily as needed for muscle spasms (sedation precautions)., Disp: 30 tablet, Rfl: 0   montelukast (SINGULAIR) 10 MG tablet, TAKE 1 TABLET BY MOUTH EVERYDAY AT BEDTIME, Disp: 90 tablet, Rfl: 3   ondansetron (ZOFRAN) 4 MG tablet, Take 1 tablet (4 mg total) by mouth daily as needed for nausea or vomiting., Disp: 30 tablet, Rfl: 1   ONETOUCH VERIO test strip, CHECK BLOOD SUGAR 3 TIMES A DAY, Disp: 300 strip, Rfl: 3   OVER THE COUNTER MEDICATION, Take 1 tablet by mouth daily. Superbeets, Disp: , Rfl:    OVER THE COUNTER MEDICATION, Take 1 capsule by mouth daily at 6 (six) AM. [place] sea moss, Disp: , Rfl:    OXYGEN, Inhale 2 L into the lungs at bedtime., Disp: , Rfl:    pantoprazole (PROTONIX) 40 MG tablet, TAKE 1 TABLET (40 MG TOTAL) BY MOUTH TWICE A DAY BEFORE MEALS, Disp: 180 tablet, Rfl: 4   potassium chloride (MICRO-K) 10 MEQ CR capsule,  Take 1 capsule (10 mEq total) by mouth daily., Disp: 90 capsule, Rfl: 3   Probiotic Product (PROBIOTIC DAILY PO), Take 1 tablet by mouth daily., Disp: , Rfl:    Saw Palmetto 450 MG CAPS, Take 1 capsule (450 mg total) by mouth daily., Disp: , Rfl: 0   simvastatin (ZOCOR) 20 MG tablet, TAKE 1 TABLET BY MOUTH EVERY DAY IN THE EVENING, Disp: 90 tablet, Rfl: 1   triamcinolone cream (KENALOG) 0.1 %, Apply 1 Application topically 2  (two) times daily. For no more than 10 days at a time, Disp: 45 g, Rfl: 0   Turmeric (QC TUMERIC COMPLEX PO), Take 1 tablet by mouth daily at 6 (six) AM., Disp: , Rfl:    vitamin B-12 (CYANOCOBALAMIN) 1000 MCG tablet, Take 1 tablet (1,000 mcg total) by mouth every Monday, Wednesday, and Friday., Disp: , Rfl:    warfarin (COUMADIN) 5 MG tablet, TAKE 1/2 TABLET BY MOUTH DAILY EXCEPT TAKE 1 TABLET ON MONDAYS, WEDNESDAYS AND FRIDAYS OR AS DIRECTED BY ANTICOAGULATION CLINIC (Patient taking differently: Take 2.5-5 mg by mouth daily. TAKE 1/2 TABLET BY MOUTH DAILY EXCEPT TAKE 1 TABLET ON MONDAYS, WEDNESDAYS AND FRIDAYS OR AS DIRECTED BY ANTICOAGULATION CLINIC), Disp: 60 tablet, Rfl: 1  Physical exam:  Vitals:   11/19/22 1307  BP: (!) 145/85  Pulse: (!) 52  Resp: 18  Temp: (!) 96.7 F (35.9 C)  TempSrc: Tympanic  SpO2: 100%  Weight: 224 lb 14.4 oz (102 kg)  Height: 5\' 11"  (1.803 m)   Physical Exam Cardiovascular:     Rate and Rhythm: Normal rate and regular rhythm.     Heart sounds: Normal heart sounds.  Pulmonary:     Effort: Pulmonary effort is normal.     Breath sounds: Normal breath sounds.  Abdominal:     General: Bowel sounds are normal.     Palpations: Abdomen is soft.  Skin:    General: Skin is warm and dry.  Neurological:     Mental Status: He is alert and oriented to person, place, and time.         Latest Ref Rng & Units 08/05/2022   10:33 AM  CMP  Glucose 70 - 99 mg/dL 387   BUN 8 - 23 mg/dL 15   Creatinine 5.64 - 1.24 mg/dL 3.32   Sodium 951 - 884 mmol/L 139   Potassium 3.5 - 5.1 mmol/L 3.3   Chloride 98 - 111 mmol/L 92       Latest Ref Rng & Units 11/19/2022   12:51 PM  CBC  WBC 4.0 - 10.5 K/uL 8.2   Hemoglobin 13.0 - 17.0 g/dL 9.7   Hematocrit 16.6 - 52.0 % 32.0   Platelets 150 - 400 K/uL 241      Assessment and plan- Patient is a 67 y.o. male here for routine follow-up of iron deficiency anemia  Patient recently received 2 doses of Feraheme in early  July 2024.  His hemoglobin has improved from 8-9.7 but has not gone back to his baseline which is close to 11.  Ferritin levels are Borderline at 46 and iron saturation is still low at 14%.  It would be reasonable to give him 2 more doses of Feraheme at this time which we will schedule.  Repeat CBC ferritin and iron studies in 6 weeks and 12 weeks and I will see him back in 12 weeks   Visit Diagnosis 1. Iron deficiency anemia, unspecified iron deficiency anemia type      Dr.  Owens Shark, MD, MPH [facility] at [facility] [phone] 11/19/2022 3:00 PM

## 2022-11-24 ENCOUNTER — Encounter (INDEPENDENT_AMBULATORY_CARE_PROVIDER_SITE_OTHER): Payer: Self-pay

## 2022-11-26 ENCOUNTER — Inpatient Hospital Stay: Payer: Medicare Other | Attending: Oncology

## 2022-11-26 VITALS — BP 134/74 | HR 59 | Temp 96.7°F | Resp 18

## 2022-11-26 DIAGNOSIS — D508 Other iron deficiency anemias: Secondary | ICD-10-CM

## 2022-11-26 DIAGNOSIS — D509 Iron deficiency anemia, unspecified: Secondary | ICD-10-CM | POA: Insufficient documentation

## 2022-11-26 MED ORDER — SODIUM CHLORIDE 0.9 % IV SOLN
INTRAVENOUS | Status: DC | PRN
  Filled 2022-11-26: qty 250

## 2022-11-26 MED ORDER — SODIUM CHLORIDE 0.9 % IV SOLN
510.0000 mg | INTRAVENOUS | Status: DC
  Administered 2022-11-26: 510 mg via INTRAVENOUS
  Filled 2022-11-26: qty 510

## 2022-11-26 NOTE — Progress Notes (Signed)
Patient declined to wait the 30 minutes for post iron infusion observation today.  Post iron education done. Patient verbalized understanding. 

## 2022-12-03 ENCOUNTER — Inpatient Hospital Stay: Payer: Medicare Other

## 2022-12-03 ENCOUNTER — Ambulatory Visit: Payer: Self-pay

## 2022-12-03 VITALS — BP 126/80 | HR 55 | Temp 99.0°F | Resp 19

## 2022-12-03 DIAGNOSIS — D508 Other iron deficiency anemias: Secondary | ICD-10-CM

## 2022-12-03 DIAGNOSIS — D509 Iron deficiency anemia, unspecified: Secondary | ICD-10-CM | POA: Diagnosis not present

## 2022-12-03 MED ORDER — SODIUM CHLORIDE 0.9 % IV SOLN
INTRAVENOUS | Status: DC
  Filled 2022-12-03: qty 250

## 2022-12-03 MED ORDER — SODIUM CHLORIDE 0.9 % IV SOLN
510.0000 mg | INTRAVENOUS | Status: DC
  Administered 2022-12-03: 510 mg via INTRAVENOUS
  Filled 2022-12-03: qty 510

## 2022-12-03 NOTE — Patient Outreach (Signed)
  Care Coordination   12/03/2022 Name: TADAO FAUSS MRN: [id number] DOB: 08-13-1955   Care Coordination Outreach Attempts:  Contact made with patient.  Patient states he is currently having an infusion and request call back on another day.   Follow Up Plan:  Additional outreach attempts will be made to offer the patient care coordination information and services.   Encounter Outcome:  Pt. Request to Call Back   Care Coordination Interventions:  No, not indicated    George Ina [facility] Care Coordination [phone] direct line

## 2022-12-04 ENCOUNTER — Other Ambulatory Visit: Payer: Self-pay | Admitting: Family Medicine

## 2022-12-04 DIAGNOSIS — E1169 Type 2 diabetes mellitus with other specified complication: Secondary | ICD-10-CM

## 2022-12-11 DIAGNOSIS — R0609 Other forms of dyspnea: Secondary | ICD-10-CM | POA: Diagnosis not present

## 2022-12-11 DIAGNOSIS — I4819 Other persistent atrial fibrillation: Secondary | ICD-10-CM

## 2022-12-15 DIAGNOSIS — G4733 Obstructive sleep apnea (adult) (pediatric): Secondary | ICD-10-CM | POA: Diagnosis not present

## 2022-12-15 DIAGNOSIS — J9601 Acute respiratory failure with hypoxia: Secondary | ICD-10-CM | POA: Diagnosis not present

## 2022-12-16 ENCOUNTER — Ambulatory Visit (INDEPENDENT_AMBULATORY_CARE_PROVIDER_SITE_OTHER): Payer: Medicare Other

## 2022-12-16 DIAGNOSIS — Z7901 Long term (current) use of anticoagulants: Secondary | ICD-10-CM

## 2022-12-16 LAB — POCT INR: INR: 1.6 — AB (ref 2.0–3.0)

## 2022-12-16 NOTE — Patient Instructions (Addendum)
Pre visit review using our clinic review tool, if applicable. No additional management support is needed unless otherwise documented below in the visit note.  Increase dose todoay to take 1 1/2 tablets and increase dose tomorrow to take 1 1/2  tablets and then continue 1 tablet daily except take 1/2 tablet on Tuesday and Saturday.  Recheck in 3 weeks.

## 2022-12-16 NOTE — Progress Notes (Signed)
Pt has been eating a lot of cabbage lately.  Increase dose todoay to take 1 1/2 tablets and increase dose tomorrow to take 1 1/2  tablets and then continue 1 tablet daily except take 1/2 tablet on Tuesday and Saturday.  Recheck in 3 weeks.

## 2022-12-20 DIAGNOSIS — R0609 Other forms of dyspnea: Secondary | ICD-10-CM | POA: Diagnosis not present

## 2022-12-20 DIAGNOSIS — I4819 Other persistent atrial fibrillation: Secondary | ICD-10-CM | POA: Diagnosis not present

## 2022-12-23 DIAGNOSIS — I6523 Occlusion and stenosis of bilateral carotid arteries: Secondary | ICD-10-CM | POA: Diagnosis not present

## 2022-12-23 DIAGNOSIS — I4891 Unspecified atrial fibrillation: Secondary | ICD-10-CM | POA: Diagnosis not present

## 2022-12-23 DIAGNOSIS — G4733 Obstructive sleep apnea (adult) (pediatric): Secondary | ICD-10-CM | POA: Diagnosis not present

## 2022-12-23 DIAGNOSIS — I4892 Unspecified atrial flutter: Secondary | ICD-10-CM | POA: Diagnosis not present

## 2022-12-23 DIAGNOSIS — I420 Dilated cardiomyopathy: Secondary | ICD-10-CM | POA: Diagnosis not present

## 2022-12-23 DIAGNOSIS — R42 Dizziness and giddiness: Secondary | ICD-10-CM | POA: Diagnosis not present

## 2022-12-23 DIAGNOSIS — E782 Mixed hyperlipidemia: Secondary | ICD-10-CM | POA: Diagnosis not present

## 2022-12-23 DIAGNOSIS — I714 Abdominal aortic aneurysm, without rupture, unspecified: Secondary | ICD-10-CM | POA: Diagnosis not present

## 2022-12-23 DIAGNOSIS — I5022 Chronic systolic (congestive) heart failure: Secondary | ICD-10-CM | POA: Diagnosis not present

## 2022-12-23 DIAGNOSIS — Z79899 Other long term (current) drug therapy: Secondary | ICD-10-CM | POA: Diagnosis not present

## 2022-12-23 DIAGNOSIS — I2510 Atherosclerotic heart disease of native coronary artery without angina pectoris: Secondary | ICD-10-CM | POA: Diagnosis not present

## 2022-12-31 ENCOUNTER — Inpatient Hospital Stay: Payer: Medicare Other | Attending: Oncology

## 2022-12-31 DIAGNOSIS — D509 Iron deficiency anemia, unspecified: Secondary | ICD-10-CM | POA: Diagnosis not present

## 2022-12-31 LAB — IRON AND TIBC
Iron: 63 ug/dL (ref 45–182)
Saturation Ratios: 18 % (ref 17.9–39.5)
TIBC: 358 ug/dL (ref 250–450)
UIBC: 295 ug/dL

## 2022-12-31 LAB — CBC
HCT: 34.7 % — ABNORMAL LOW (ref 39.0–52.0)
Hemoglobin: 10.8 g/dL — ABNORMAL LOW (ref 13.0–17.0)
MCH: 28.7 pg (ref 26.0–34.0)
MCHC: 31.1 g/dL (ref 30.0–36.0)
MCV: 92.3 fL (ref 80.0–100.0)
Platelets: 181 10*3/uL (ref 150–400)
RBC: 3.76 MIL/uL — ABNORMAL LOW (ref 4.22–5.81)
RDW: 17.7 % — ABNORMAL HIGH (ref 11.5–15.5)
WBC: 8 10*3/uL (ref 4.0–10.5)
nRBC: 0 % (ref 0.0–0.2)

## 2022-12-31 LAB — FERRITIN: Ferritin: 37 ng/mL (ref 24–336)

## 2023-01-06 ENCOUNTER — Ambulatory Visit (INDEPENDENT_AMBULATORY_CARE_PROVIDER_SITE_OTHER): Payer: Medicare Other

## 2023-01-06 DIAGNOSIS — Z7901 Long term (current) use of anticoagulants: Secondary | ICD-10-CM | POA: Diagnosis not present

## 2023-01-06 DIAGNOSIS — I5022 Chronic systolic (congestive) heart failure: Secondary | ICD-10-CM | POA: Diagnosis not present

## 2023-01-06 DIAGNOSIS — I6523 Occlusion and stenosis of bilateral carotid arteries: Secondary | ICD-10-CM | POA: Diagnosis not present

## 2023-01-06 DIAGNOSIS — I420 Dilated cardiomyopathy: Secondary | ICD-10-CM | POA: Diagnosis not present

## 2023-01-06 LAB — POCT INR: INR: 3.3 — AB (ref 2.0–3.0)

## 2023-01-06 NOTE — Progress Notes (Signed)
Pt took a full dose this week when he was supposed to take a 1/2 dose. Pt already took warfarin today. Reduce dose tomorrow to take 1/2 tablets and then continue 1 tablet daily except take 1/2 tablet on Tuesday and Saturday.  Recheck in 3 weeks.

## 2023-01-06 NOTE — Patient Instructions (Addendum)
Pre visit review using our clinic review tool, if applicable. No additional management support is needed unless otherwise documented below in the visit note.  Reduce dose tomorrow to take 1/2 tablets and then continue 1 tablet daily except take 1/2 tablet on Tuesday and Saturday.  Recheck in 3 weeks.

## 2023-01-13 ENCOUNTER — Telehealth: Payer: Self-pay | Admitting: Cardiology

## 2023-01-13 NOTE — Telephone Encounter (Signed)
Pt was contacted to schedule overdue annual check up with [facility] in [place]. Pt returned call today stating he sees Minda Ditto at the [facility] and isn't sure why he needs to see both physicians. He states she follows him for his AF as well, and just recently seen her. He would like clarity on why he needs to see both Dr. Lalla Brothers and Madaline Guthrie. Please advise.

## 2023-01-22 ENCOUNTER — Other Ambulatory Visit: Payer: Self-pay | Admitting: Family Medicine

## 2023-01-22 DIAGNOSIS — E1169 Type 2 diabetes mellitus with other specified complication: Secondary | ICD-10-CM

## 2023-01-24 ENCOUNTER — Encounter: Payer: Self-pay | Admitting: Student in an Organized Health Care Education/Training Program

## 2023-01-24 ENCOUNTER — Other Ambulatory Visit: Payer: Self-pay | Admitting: Family Medicine

## 2023-01-24 ENCOUNTER — Ambulatory Visit: Payer: Medicare Other | Admitting: Student in an Organized Health Care Education/Training Program

## 2023-01-24 VITALS — BP 138/66 | HR 67 | Temp 97.6°F | Ht 71.0 in | Wt 236.4 lb

## 2023-01-24 DIAGNOSIS — Z87891 Personal history of nicotine dependence: Secondary | ICD-10-CM | POA: Diagnosis not present

## 2023-01-24 DIAGNOSIS — J9612 Chronic respiratory failure with hypercapnia: Secondary | ICD-10-CM | POA: Diagnosis not present

## 2023-01-24 DIAGNOSIS — J449 Chronic obstructive pulmonary disease, unspecified: Secondary | ICD-10-CM

## 2023-01-24 DIAGNOSIS — G4733 Obstructive sleep apnea (adult) (pediatric): Secondary | ICD-10-CM

## 2023-01-24 DIAGNOSIS — Z7901 Long term (current) use of anticoagulants: Secondary | ICD-10-CM

## 2023-01-24 NOTE — Progress Notes (Signed)
Assessment & Plan:   1. Chronic obstructive pulmonary disease (GOLD 3B) 2. Possible Obstructive sleep apnea 3. Former cigarette smoker  Patient with a history of HFpEF with diastolic dysfunction and enlarged RV consistent with a diagnosis of heart failure which explains his overall symptoms and presentation. He has lower extremity pitting edema and a TTE consistent with HFpEF. That said, he has a 31 pack year smoking history and has carried a diagnosis of COPD previously. He is currently on Breztri and is compliant with it. PFT's performed March of 2024 confirm the diagnosis of COPD (FEV1 41% predicted).   I suspected the patient is at risk for OSA vs OHS but patient is unable to complete an in-lab sleep study to better assess this. We attempted to initiate BiPAP at home but this was not covered by his insurance provider.   Today, I recommended that [name] continue [name], and use it twice daily. We also discussed compliance with the rest of his medications. He reports significant dietary modifications that he feels have helped significantly with his symptoms.  -Continue Breztri twice daily -continue salt avoidance  Return in about 1 year (around 01/24/2024).  I spent 30 minutes caring for this patient today, including preparing to see the patient, obtaining a medical history , reviewing a separately obtained history, performing a medically appropriate examination and/or evaluation, counseling and educating the patient/family/caregiver, ordering medications, tests, or procedures, and documenting clinical information in the electronic health record  [name] Chute, MD [facility] 01/24/2023 4:52 PM    End of visit medications:  No orders of the defined types were placed in this encounter.    Current Outpatient Medications:    ACCU-CHEK FASTCLIX LANCETS MISC, Check blood sugar once daily and as instructed. Dx 250.00, Disp: 100 each, Rfl: 3   acetaminophen  (TYLENOL) 500 MG tablet, Take 2 tablets (1,000 mg total) by mouth every 6 (six) hours as needed for mild pain., Disp: , Rfl:    albuterol (VENTOLIN HFA) 108 (90 Base) MCG/ACT inhaler, Inhale 2 puffs into the lungs every 6 (six) hours as needed for wheezing or shortness of breath., Disp: 8.5 each, Rfl: 6   amiodarone (PACERONE) 200 MG tablet, Take 1 tablet (200 mg total) by mouth daily., Disp: 90 tablet, Rfl: 3   Blood Glucose Monitoring Suppl (BLOOD GLUCOSE MONITOR SYSTEM) W/DEVICE KIT, by Does not apply route. Use to check sugar once daily and as needed Dx: E11.9 **ONE TOUCH VERIO**, Disp: , Rfl:    Budeson-Glycopyrrol-Formoterol (BREZTRI AEROSPHERE) 160-9-4.8 MCG/ACT AERO, Inhale 2 puffs into the lungs 2 (two) times daily., Disp: 32.1 g, Rfl: 3   carvedilol (COREG) 6.25 MG tablet, TAKE 1 TABLET BY MOUTH TWICE A DAY WITH FOOD, Disp: 180 tablet, Rfl: 1   Cholecalciferol (VITAMIN D) 50 MCG (2000 UT) CAPS, Take 1 capsule (2,000 Units total) by mouth daily., Disp: 30 capsule, Rfl:    dapagliflozin propanediol (FARXIGA) 10 MG TABS tablet, Take 1 tablet (10 mg total) by mouth daily before breakfast., Disp: 90 tablet, Rfl: 3   Docusate Calcium (STOOL SOFTENER PO), Take 1 tablet by mouth daily., Disp: , Rfl:    FLUoxetine (PROZAC) 40 MG capsule, TAKE 1 CAPSULE (40 MG TOTAL) BY MOUTH DAILY., Disp: 30 capsule, Rfl: 8   fluticasone (FLONASE) 50 MCG/ACT nasal spray, Place 2 sprays into both nostrils daily. (Patient taking differently: Place 2 sprays into both nostrils as needed.), Disp: 48 mL, Rfl: 3   furosemide (LASIX) 80 MG tablet, TAKE 1  TABLET BY MOUTH 2 TIMES DAILY., Disp: 180 tablet, Rfl: 4   ipratropium-albuterol (DUONEB) 0.5-2.5 (3) MG/3ML SOLN, TAKE 3 MLS BY NEBULIZATION EVERY 6 (SIX) HOURS AS NEEDED (SEVERE SHORTNESS OF BREATH/WHEEZING)., Disp: 360 mL, Rfl: 0   metFORMIN (GLUCOPHAGE) 500 MG tablet, Take 1 tablet (500 mg total) by mouth 2 (two) times daily with a meal., Disp: 180 tablet, Rfl: 1    methocarbamol (ROBAXIN) 500 MG tablet, Take 1 tablet (500 mg total) by mouth 3 (three) times daily as needed for muscle spasms (sedation precautions)., Disp: 30 tablet, Rfl: 0   montelukast (SINGULAIR) 10 MG tablet, TAKE 1 TABLET BY MOUTH EVERYDAY AT BEDTIME, Disp: 90 tablet, Rfl: 3   ondansetron (ZOFRAN) 4 MG tablet, Take 1 tablet (4 mg total) by mouth daily as needed for nausea or vomiting., Disp: 30 tablet, Rfl: 1   ONETOUCH VERIO test strip, CHECK BLOOD SUGAR 3 TIMES A DAY, Disp: 300 strip, Rfl: 3   OXYGEN, Inhale 2 L into the lungs at bedtime., Disp: , Rfl:    pantoprazole (PROTONIX) 40 MG tablet, TAKE 1 TABLET (40 MG TOTAL) BY MOUTH TWICE A DAY BEFORE MEALS, Disp: 180 tablet, Rfl: 4   potassium chloride (MICRO-K) 10 MEQ CR capsule, Take 1 capsule (10 mEq total) by mouth daily., Disp: 90 capsule, Rfl: 3   simvastatin (ZOCOR) 20 MG tablet, TAKE 1 TABLET BY MOUTH EVERY DAY IN THE EVENING, Disp: 90 tablet, Rfl: 1   triamcinolone cream (KENALOG) 0.1 %, Apply 1 Application topically 2 (two) times daily. For no more than 10 days at a time, Disp: 45 g, Rfl: 0   vitamin B-12 (CYANOCOBALAMIN) 1000 MCG tablet, Take 1 tablet (1,000 mcg total) by mouth every Monday, Wednesday, and Friday., Disp: , Rfl:    warfarin (COUMADIN) 5 MG tablet, TAKE 1/2 TABLET BY MOUTH DAILY EXCEPT TAKE 1 TABLET ON MONDAYS, WEDNESDAYS AND FRIDAYS OR AS DIRECTED BY ANTICOAGULATION CLINIC (Patient taking differently: Take 2.5-5 mg by mouth daily. TAKE 1/2 TABLET BY MOUTH DAILY EXCEPT TAKE 1 TABLET ON MONDAYS, WEDNESDAYS AND FRIDAYS OR AS DIRECTED BY ANTICOAGULATION CLINIC), Disp: 60 tablet, Rfl: 1   Subjective:   PATIENT ID: [name] GENDER: male DOB: Aug 10, 1955, MRN: [id number]  Chief Complaint  Patient presents with   Follow-up    Shortness of breath on exertion.    HPI  [name] is a pleasant 67 year old male with a history of OSA and COPD (previously followed by Dr. Nicholos Johns, last visit 2020) who presents  to clinic for an evaluation.   He's felt well since our last visit and hasn't had any new symptoms. He's reported using all his medications once daily. He reports that using the Breztri once or twice daily feels the same to him. We had attempted to get a sleep study but the patient isn't able to do one in lab given he has a dog at home that cannot be left alone. Patient feels that his shortness of breath is at baseline, and manageable. He hasn't had any worsening of his cough. No wheeze is reported, no chest tightness, and no chest pain. He coughs in the morning but this is not productive.   Review of his record is notable for an VBG from 12/28/2021 showing a pH of 7.36 and a CO2 of 31. His BMP is notable for elevated bicarb, most recently at 44. CT chest did not show any consolidations on admission to the hospital, while his BNP was elevated to 289.  Patient  has a history of smoking cigarettes, quit in 2020. He does also have a history of using MJ.  Ancillary information including prior medications, full medical/surgical/family/social histories, and PFTs (when available) are listed below and have been reviewed.   Review of Systems  Constitutional:  Negative for chills, fever and weight loss.  Respiratory:  Positive for shortness of breath. Negative for wheezing.   Cardiovascular:  Positive for leg swelling.     Objective:   Vitals:   01/24/23 1120  BP: 138/66  Pulse: 67  Temp: 97.6 F (36.4 C)  TempSrc: Temporal  SpO2: 93%  Weight: 236 lb 6.4 oz (107.2 kg)  Height: 5\' 11"  (1.803 m)   93% on RA BMI Readings from Last 3 Encounters:  01/24/23 32.97 kg/m  11/19/22 31.37 kg/m  11/15/22 31.43 kg/m   Wt Readings from Last 3 Encounters:  01/24/23 236 lb 6.4 oz (107.2 kg)  11/19/22 224 lb 14.4 oz (102 kg)  11/15/22 225 lb 6 oz (102.2 kg)    Physical Exam Constitutional:      General: He is not in acute distress.    Appearance: He is obese. He is not ill-appearing.  HENT:      Head: Normocephalic.     Mouth/Throat:     Mouth: Mucous membranes are moist.  Eyes:     Extraocular Movements: Extraocular movements intact.  Cardiovascular:     Rate and Rhythm: Normal rate and regular rhythm.     Heart sounds: Normal heart sounds.  Pulmonary:     Effort: Pulmonary effort is normal.     Breath sounds: Normal breath sounds.  Abdominal:     General: There is distension.     Palpations: Abdomen is soft.  Musculoskeletal:     Cervical back: Normal range of motion and neck supple.     Right lower leg: Edema present.     Left lower leg: Edema present.     Comments: Changes of stasis dermatitis  Neurological:     General: No focal deficit present.     Mental Status: He is alert and oriented to person, place, and time. Mental status is at baseline.       Ancillary Information    Past Medical History:  Diagnosis Date   Adrenal nodule (HCC) 12/28/2021   a.) CT abd/pel 12/28/2021: LEFT adrenal nodule measuring 1.6 cm   Anemia    Angina    Anxiety    Aortic atherosclerosis (HCC)    Arthritis    Atrial fibrillation and flutter (HCC) 2013   a.) CHA2DS2VASc = 5 (age, CHF, HTN, vascular disease history, T2DM);  b.) s/p DCCV (100 J) 10/27/2010; c.) s/p ablation 09/21/2011; d.) rate/rhythm maintained on oral amiodarone + carvedilol; chronically anticoagulated with warfarin   CAD (coronary artery disease) 07/15/2010   a.) LHC 07/15/2010: 30% pLAD, 40% D1, 25%/30% pLCx, 30% mLCx - med mgmt   CHF (congestive heart failure) - NYHA Class II/III 2013   a.) TTE 02/26/2013: EF 50%, mild MR/TR; b.) EST 06/13/2017: EF 50%, mild LCH, triv MR/TR; c.) TTTE 03/24/2020: EF >55%, mild LVH, mild MAC, mild BAE, triv PR, mild MR/TR, G1DD; d.) TTE 11/19/2021: EF 60-65%, mod reduced RVSF, mild LAE, mod MAC, Ao root 37 mm, G2DD   COPD (chronic obstructive pulmonary disease) (HCC)    Diabetes type 2, uncontrolled    declines DSME   Dilated cardiomyopathy (HCC) 2013   a.) EF 25% at time of  Dx in 2013; b.) TTE 02/26/2013: EF 50%; c.) EST  06/13/2017: EF 50%;  d.) TTE 03/24/2020: EF >55%; e.) TTE 11/19/2021: EF 60-65%   Ex-smoker    GERD (gastroesophageal reflux disease)    Hepatic steatosis    Hyperlipidemia    Hypertension    Infrarenal abdominal aortic aneurysm (AAA) without rupture (HCC) 08/11/2016   a.) CT abd/pel 08/11/2016: 3.3 cm; b.) CT abd/pel 12/22/2018: 3.3 cm; c.) CT abd/pel 08/31/2021: 3.7 cm; d.) CT abd/pel 12/28/2021: 3.9 cm   Long term current use of amiodarone 2023   a.) previously on dranaderone (cost prohibitive); switched to amiodarone 2023   Long term current use of anticoagulant    a.) warfarin   Marijuana use    a.) UDS (+) of THC x 3 (02/06/2013, 06/29/2013, 09/03/2013); PCP advised "one more and we will stop prescribing ativan (08/2013)"   Migraine    Narcolepsy    Obesity    OSA (obstructive sleep apnea)    a.) unable to tolerate nocturnal PAP therapy; uses supplemetal oxygen at 2L/[place] during sleep   Seasonal allergies    Umbilical hernia      Family History  Problem Relation Age of Onset   Heart failure Father 41   Cancer Mother 36       NHL   Hypertension Maternal Grandfather    CAD Maternal Grandfather 19       MI   Diabetes Other        grandparents   Cancer Brother 51       lung (smoker)     Past Surgical History:  Procedure Laterality Date   ATRIAL FLUTTER ABLATION N/A 09/21/2011   Procedure: ATRIAL FLUTTER ABLATION;  Surgeon: Hillis Range, MD;  Location: [facility];  Service: Cardiovascular;  Laterality: N/A;   CARDIAC ELECTROPHYSIOLOGY MAPPING AND ABLATION  2013   for atrial flutter   CARDIOVASCULAR STRESS TEST  03/2012   ETT WNL Gwen Pounds)   CARDIOVERSION N/A 08/12/2021   Procedure: CARDIOVERSION;  Surgeon: Lamar Blinks, MD;  Location: [facility] ORS;  Service: Cardiovascular;  Laterality: N/A;   COLONOSCOPY WITH PROPOFOL N/A 05/22/2020   TA, diverticulosis, descending colon ulcer biopsy WNL, int hem Servando Snare, Darren, MD)    COLONOSCOPY WITH PROPOFOL N/A 04/28/2022   multiple TAs - 10 small, 2 1.2cm, 2 9mm, rpt 3 yrs (Vanga, Loel Dubonnet, MD)   CYST EXCISION N/A 08/05/2022   excision of back cysts x 2 (pathology returned epidermal inclusion cysts);  Surgeon: Henrene Dodge, MD   ESOPHAGOGASTRODUODENOSCOPY (EGD) WITH PROPOFOL N/A 05/22/2020   WNL (Wohl)   ESOPHAGOGASTRODUODENOSCOPY (EGD) WITH PROPOFOL N/A 04/28/2022   12mm duodenal TA, reactive gastropathy, enteric mucosa in stomach biopsy of unclear significance (Vanga, Loel Dubonnet, MD)   GIVENS CAPSULE STUDY  04/28/2022   Procedure: GIVENS CAPSULE STUDY;  Surgeon: Toney Reil, MD;  Location: [facility];  Service: Endoscopy;;   US ECHOCARDIOGRAPHY  2014   EF 50%, nl LV fxn, RV nl size/function, mild mitral insuff    Social History   Socioeconomic History   Marital status: Single    Spouse name: Not on file   Number of children: 0   Years of education: Not on file   Highest education level: 12th grade  Occupational History   Occupation: disable   Tobacco Use   Smoking status: Former    Current packs/day: 0.00    Average packs/day: 1 pack/day for 31.0 years (31.0 ttl pk-yrs)    Types: Cigarettes    Start date: 04/27/1987    Quit date: 04/26/2018  Years since quitting: 4.7    Passive exposure: Past   Smokeless tobacco: Never  Vaping Use   Vaping status: Never Used  Substance and Sexual Activity   Alcohol use: No    Alcohol/week: 0.0 standard drinks of alcohol   Drug use: Yes    Types: Marijuana   Sexual activity: Not Currently  Other Topics Concern   Not on file  Social History Narrative   Pt lives in [place]   Divorced   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   Disability due to neck arthritis, anxiety and sleep apnea   Edu: HS   Activity: no regular exercise, mows lawn (riding and push)   Diet: good water, fruits/vegetables daily   Social Determinants of Health   Financial Resource Strain: Low Risk  (08/29/2022)   Overall  Financial Resource Strain (CARDIA)    Difficulty of Paying Living Expenses: Not very hard  Food Insecurity: No Food Insecurity (08/29/2022)   Hunger Vital Sign    Worried About Running Out of Food in the Last Year: Never true    Ran Out of Food in the Last Year: Never true  Transportation Needs: No Transportation Needs (08/29/2022)   PRAPARE - Administrator, Civil Service (Medical): No    Lack of Transportation (Non-Medical): No  Recent Concern: Transportation Needs - Unmet Transportation Needs (08/13/2022)   PRAPARE - Transportation    Lack of Transportation (Medical): Yes    Lack of Transportation (Non-Medical): Yes  Physical Activity: Unknown (08/29/2022)   Exercise Vital Sign    Days of Exercise per Week: 0 days    Minutes of Exercise per Session: Not on file  Recent Concern: Physical Activity - Inactive (08/29/2022)   Exercise Vital Sign    Days of Exercise per Week: 0 days    Minutes of Exercise per Session: 0 min  Stress: No Stress Concern Present (08/29/2022)   [employer] of Occupational Health - Occupational Stress Questionnaire    Feeling of Stress : Not at all  Social Connections: Socially Isolated (08/29/2022)   Social Connection and Isolation Panel [NHANES]    Frequency of Communication with Friends and Family: More than three times a week    Frequency of Social Gatherings with Friends and Family: Never    Attends Religious Services: Never    Database administrator or Organizations: No    Attends Engineer, structural: Not on file    Marital Status: Divorced  Intimate Partner Violence: Not At Risk (07/09/2020)   Humiliation, Afraid, Rape, and Kick questionnaire    Fear of Current or Ex-Partner: No    Emotionally Abused: No    Physically Abused: No    Sexually Abused: No     Allergies  Allergen Reactions   Atorvastatin Other (See Comments)    Dizziness   Lisinopril Cough     CBC    Component Value Date/Time   WBC 8.0 12/31/2022 1152   RBC  3.76 (L) 12/31/2022 1152   HGB 10.8 (L) 12/31/2022 1152   HGB 9.7 (L) 11/19/2022 1251   HGB 9.5 (L) 04/13/2022 1338   HCT 34.7 (L) 12/31/2022 1152   HCT 33.2 (L) 04/13/2022 1338   PLT 181 12/31/2022 1152   PLT 241 11/19/2022 1251   PLT 464 (H) 04/13/2022 1338   MCV 92.3 12/31/2022 1152   MCV 74 (L) 04/13/2022 1338   MCV 90 06/20/2013 1139   MCH 28.7 12/31/2022 1152   MCHC 31.1 12/31/2022 1152   RDW 17.7 (H)  12/31/2022 1152   RDW 22.6 (H) 04/13/2022 1338   RDW 13.3 06/20/2013 1139   LYMPHSABS 1.5 11/19/2022 1251   MONOABS 0.8 11/19/2022 1251   EOSABS 0.1 11/19/2022 1251   BASOSABS 0.1 11/19/2022 1251    Pulmonary Functions Testing Results:    Latest Ref Rng & Units 07/20/2022    2:14 PM  PFT Results  FVC-Pre L 2.21   FVC-Predicted Pre % 46   FVC-Post L 2.50   FVC-Predicted Post % 52   Pre FEV1/FVC % % 64   Post FEV1/FCV % % 58   FEV1-Pre L 1.42   FEV1-Predicted Pre % 40   FEV1-Post L 1.45   DLCO uncorrected ml/min/mmHg 12.94   DLCO UNC% % 47   DLVA Predicted % 73   TLC L 4.63   TLC % Predicted % 64   RV % Predicted % 92     Outpatient Medications Prior to Visit  Medication Sig Dispense Refill   ACCU-CHEK FASTCLIX LANCETS MISC Check blood sugar once daily and as instructed. Dx 250.00 100 each 3   acetaminophen (TYLENOL) 500 MG tablet Take 2 tablets (1,000 mg total) by mouth every 6 (six) hours as needed for mild pain.     albuterol (VENTOLIN HFA) 108 (90 Base) MCG/ACT inhaler Inhale 2 puffs into the lungs every 6 (six) hours as needed for wheezing or shortness of breath. 8.5 each 6   amiodarone (PACERONE) 200 MG tablet Take 1 tablet (200 mg total) by mouth daily. 90 tablet 3   Blood Glucose Monitoring Suppl (BLOOD GLUCOSE MONITOR SYSTEM) W/DEVICE KIT by Does not apply route. Use to check sugar once daily and as needed Dx: E11.9 **ONE TOUCH VERIO**     Budeson-Glycopyrrol-Formoterol (BREZTRI AEROSPHERE) 160-9-4.8 MCG/ACT AERO Inhale 2 puffs into the lungs 2 (two) times  daily. 32.1 g 3   carvedilol (COREG) 6.25 MG tablet TAKE 1 TABLET BY MOUTH TWICE A DAY WITH FOOD 180 tablet 1   Cholecalciferol (VITAMIN D) 50 MCG (2000 UT) CAPS Take 1 capsule (2,000 Units total) by mouth daily. 30 capsule    dapagliflozin propanediol (FARXIGA) 10 MG TABS tablet Take 1 tablet (10 mg total) by mouth daily before breakfast. 90 tablet 3   Docusate Calcium (STOOL SOFTENER PO) Take 1 tablet by mouth daily.     FLUoxetine (PROZAC) 40 MG capsule TAKE 1 CAPSULE (40 MG TOTAL) BY MOUTH DAILY. 30 capsule 8   fluticasone (FLONASE) 50 MCG/ACT nasal spray Place 2 sprays into both nostrils daily. (Patient taking differently: Place 2 sprays into both nostrils as needed.) 48 mL 3   furosemide (LASIX) 80 MG tablet TAKE 1 TABLET BY MOUTH 2 TIMES DAILY. 180 tablet 4   ipratropium-albuterol (DUONEB) 0.5-2.5 (3) MG/3ML SOLN TAKE 3 MLS BY NEBULIZATION EVERY 6 (SIX) HOURS AS NEEDED (SEVERE SHORTNESS OF BREATH/WHEEZING). 360 mL 0   metFORMIN (GLUCOPHAGE) 500 MG tablet Take 1 tablet (500 mg total) by mouth 2 (two) times daily with a meal. 180 tablet 1   methocarbamol (ROBAXIN) 500 MG tablet Take 1 tablet (500 mg total) by mouth 3 (three) times daily as needed for muscle spasms (sedation precautions). 30 tablet 0   montelukast (SINGULAIR) 10 MG tablet TAKE 1 TABLET BY MOUTH EVERYDAY AT BEDTIME 90 tablet 3   ondansetron (ZOFRAN) 4 MG tablet Take 1 tablet (4 mg total) by mouth daily as needed for nausea or vomiting. 30 tablet 1   ONETOUCH VERIO test strip CHECK BLOOD SUGAR 3 TIMES A DAY 300 strip 3  OXYGEN Inhale 2 L into the lungs at bedtime.     pantoprazole (PROTONIX) 40 MG tablet TAKE 1 TABLET (40 MG TOTAL) BY MOUTH TWICE A DAY BEFORE MEALS 180 tablet 4   potassium chloride (MICRO-K) 10 MEQ CR capsule Take 1 capsule (10 mEq total) by mouth daily. 90 capsule 3   simvastatin (ZOCOR) 20 MG tablet TAKE 1 TABLET BY MOUTH EVERY DAY IN THE EVENING 90 tablet 1   triamcinolone cream (KENALOG) 0.1 % Apply 1  Application topically 2 (two) times daily. For no more than 10 days at a time 45 g 0   vitamin B-12 (CYANOCOBALAMIN) 1000 MCG tablet Take 1 tablet (1,000 mcg total) by mouth every Monday, Wednesday, and Friday.     warfarin (COUMADIN) 5 MG tablet TAKE 1/2 TABLET BY MOUTH DAILY EXCEPT TAKE 1 TABLET ON MONDAYS, WEDNESDAYS AND FRIDAYS OR AS DIRECTED BY ANTICOAGULATION CLINIC (Patient taking differently: Take 2.5-5 mg by mouth daily. TAKE 1/2 TABLET BY MOUTH DAILY EXCEPT TAKE 1 TABLET ON MONDAYS, WEDNESDAYS AND FRIDAYS OR AS DIRECTED BY ANTICOAGULATION CLINIC) 60 tablet 1   No facility-administered medications prior to visit.

## 2023-01-25 NOTE — Telephone Encounter (Signed)
Patient called in to follow up on this refill. He stated that he is out of the medication.

## 2023-01-26 NOTE — Telephone Encounter (Signed)
Noted  

## 2023-01-26 NOTE — Telephone Encounter (Signed)
ERx ?Plz notify pt. ?

## 2023-01-27 ENCOUNTER — Ambulatory Visit: Payer: Medicare Other

## 2023-01-27 DIAGNOSIS — Z7901 Long term (current) use of anticoagulants: Secondary | ICD-10-CM | POA: Diagnosis not present

## 2023-01-27 LAB — POCT INR: INR: 1.3 — AB (ref 2.0–3.0)

## 2023-01-27 NOTE — Progress Notes (Signed)
Pt ran out of warfarin 3 days ago. He has picked up script and will restart tonight. Advised pt to contact coumadin clinic directly for warfarin refills. Verified phone number with pt. He has number in his phone. Pt was stable on this dose so no change in weekly dosing. Will recheck in 2 weeks.  Increase dose today to take 1 1/2 tablets and increase dose tomorrow to take 1 1/2 tablets and then continue 1 tablet daily except take 1/2 tablet on Tuesday and Saturday.  Recheck in 2 weeks.

## 2023-01-27 NOTE — Patient Instructions (Addendum)
Pre visit review using our clinic review tool, if applicable. No additional management support is needed unless otherwise documented below in the visit note.  Increase dose today to take 1 1/2 tablets and increase dose tomorrow to take 1 1/2 tablets and then continue 1 tablet daily except take 1/2 tablet on Tuesday and Saturday.

## 2023-01-31 DIAGNOSIS — I4892 Unspecified atrial flutter: Secondary | ICD-10-CM | POA: Diagnosis not present

## 2023-01-31 DIAGNOSIS — I4891 Unspecified atrial fibrillation: Secondary | ICD-10-CM | POA: Diagnosis not present

## 2023-01-31 DIAGNOSIS — G4733 Obstructive sleep apnea (adult) (pediatric): Secondary | ICD-10-CM | POA: Diagnosis not present

## 2023-01-31 DIAGNOSIS — I2510 Atherosclerotic heart disease of native coronary artery without angina pectoris: Secondary | ICD-10-CM | POA: Diagnosis not present

## 2023-01-31 DIAGNOSIS — E782 Mixed hyperlipidemia: Secondary | ICD-10-CM | POA: Diagnosis not present

## 2023-01-31 DIAGNOSIS — I5022 Chronic systolic (congestive) heart failure: Secondary | ICD-10-CM | POA: Diagnosis not present

## 2023-01-31 DIAGNOSIS — I714 Abdominal aortic aneurysm, without rupture, unspecified: Secondary | ICD-10-CM | POA: Diagnosis not present

## 2023-01-31 DIAGNOSIS — I420 Dilated cardiomyopathy: Secondary | ICD-10-CM | POA: Diagnosis not present

## 2023-02-02 ENCOUNTER — Other Ambulatory Visit: Payer: Self-pay | Admitting: Family Medicine

## 2023-02-02 DIAGNOSIS — F410 Panic disorder [episodic paroxysmal anxiety] without agoraphobia: Secondary | ICD-10-CM

## 2023-02-06 ENCOUNTER — Other Ambulatory Visit: Payer: Self-pay | Admitting: Family Medicine

## 2023-02-10 ENCOUNTER — Ambulatory Visit: Payer: Medicare Other

## 2023-02-10 DIAGNOSIS — Z7901 Long term (current) use of anticoagulants: Secondary | ICD-10-CM | POA: Diagnosis not present

## 2023-02-10 LAB — POCT INR: INR: 1.4 — AB (ref 2.0–3.0)

## 2023-02-10 NOTE — Progress Notes (Signed)
Pt denies any changes. Advised pt to watch for s/s of a clot. Educated on s/s. Pt verbalized understanding. Increase dose today to take 1 1/2 tablets and increase dose tomorrow to take 1 1/2 tablets and then change weekly dose to take 1 tablet daily. Recheck in 1 weeks.

## 2023-02-10 NOTE — Patient Instructions (Addendum)
Pre visit review using our clinic review tool, if applicable. No additional management support is needed unless otherwise documented below in the visit note.  Increase dose today to take 1 1/2 tablets and increase dose tomorrow to take 1 1/2 tablets and then change weekly dose to take 1 tablet daily. Recheck in 1 weeks.

## 2023-02-11 ENCOUNTER — Inpatient Hospital Stay (HOSPITAL_BASED_OUTPATIENT_CLINIC_OR_DEPARTMENT_OTHER): Payer: Medicare Other | Admitting: Oncology

## 2023-02-11 ENCOUNTER — Encounter: Payer: Self-pay | Admitting: Oncology

## 2023-02-11 ENCOUNTER — Inpatient Hospital Stay: Payer: Medicare Other | Attending: Oncology

## 2023-02-11 VITALS — BP 132/75 | HR 59 | Temp 97.0°F | Resp 17 | Wt 245.0 lb

## 2023-02-11 DIAGNOSIS — Z79899 Other long term (current) drug therapy: Secondary | ICD-10-CM | POA: Diagnosis not present

## 2023-02-11 DIAGNOSIS — D509 Iron deficiency anemia, unspecified: Secondary | ICD-10-CM | POA: Insufficient documentation

## 2023-02-11 DIAGNOSIS — D508 Other iron deficiency anemias: Secondary | ICD-10-CM

## 2023-02-11 LAB — FERRITIN: Ferritin: 11 ng/mL — ABNORMAL LOW (ref 24–336)

## 2023-02-11 LAB — CBC
HCT: 33.6 % — ABNORMAL LOW (ref 39.0–52.0)
Hemoglobin: 10.7 g/dL — ABNORMAL LOW (ref 13.0–17.0)
MCH: 28.3 pg (ref 26.0–34.0)
MCHC: 31.8 g/dL (ref 30.0–36.0)
MCV: 88.9 fL (ref 80.0–100.0)
Platelets: 230 10*3/uL (ref 150–400)
RBC: 3.78 MIL/uL — ABNORMAL LOW (ref 4.22–5.81)
RDW: 14.6 % (ref 11.5–15.5)
WBC: 7.6 10*3/uL (ref 4.0–10.5)
nRBC: 0 % (ref 0.0–0.2)

## 2023-02-11 LAB — IRON AND TIBC
Iron: 42 ug/dL — ABNORMAL LOW (ref 45–182)
Saturation Ratios: 10 % — ABNORMAL LOW (ref 17.9–39.5)
TIBC: 427 ug/dL (ref 250–450)
UIBC: 385 ug/dL

## 2023-02-11 LAB — VITAMIN B12: Vitamin B-12: 525 pg/mL (ref 180–914)

## 2023-02-11 LAB — TSH: TSH: 2.19 u[IU]/mL (ref 0.350–4.500)

## 2023-02-11 LAB — FOLATE: Folate: 19.7 ng/mL (ref >5.9)

## 2023-02-11 NOTE — Progress Notes (Addendum)
Hematology/Oncology Consult note [facility]  [phone] [phone]  Patient Care Team: Eustaquio Boyden, MD as PCP - General (Family Medicine) Lanier Prude, MD as PCP - Electrophysiology (Cardiology) Hillis Range, MD (Inactive) as Attending Physician (Cardiology) Lamar Blinks, MD as Referring Physician (Internal Medicine) Kathyrn Sheriff, [facility] (Inactive) as Pharmacist (Pharmacist) Creig Hines, MD as Consulting Physician (Oncology) Creig Hines, MD as Consulting Physician (Oncology) Otho Ket, RN as [facility] Care Management   Name of the patient: [name]  [id number]  [birth year]   Date of visit: 02/11/23  Diagnosis-iron deficiency anemia  Chief complaint/ Reason for visit-routine follow-up of iron deficiency anemia  Heme/Onc history: Patient is a 67 year old male with a past medical history significant for coronary artery disease, fatty liver, COPD among other medical problems who has been referred to [place] for iron deficiency anemia.  His most recent CBC from 07/15/2021 showed H&H of 10.3/35.2 with an MCV of 69 with a normal white count and platelet count.  Hemoglobin had drifted down from 14 a year ago to 12.74 months ago to 10.3 presently.  Iron studies showed a low iron saturation of 3.2% and elevated TIBC with a ferritin level of 6.4.  B12 levels were elevated at 1500.  TSH normal at 2.8.  Patient underwent EGD and colonoscopy by Dr. Servando Snare in 2022.  EGD showed no abnormality colonoscopy showed 4 mm ulcer in the descending colon which was biopsied and was negative for malignancy.  Capsule study at that time was inconclusive as the capsule did not pass through into his small bowel.  Patient had a repeat EGD and colonoscopy in January 2024 as well which was again unremarkable with no evidence of bleeding.   Interval history-patient states that he feels unsteady at times especially on ambulation.   He has had a couple of falls over the last few months.  ECOG PS- 1 Pain scale- 0   Review of systems- Review of Systems  Constitutional:  Positive for malaise/fatigue. Negative for chills, fever and weight loss.  HENT:  Negative for congestion, ear discharge and nosebleeds.   Eyes:  Negative for blurred vision.  Respiratory:  Negative for cough, hemoptysis, sputum production, shortness of breath and wheezing.   Cardiovascular:  Negative for chest pain, palpitations, orthopnea and claudication.  Gastrointestinal:  Negative for abdominal pain, blood in stool, constipation, diarrhea, heartburn, melena, nausea and vomiting.  Genitourinary:  Negative for dysuria, flank pain, frequency, hematuria and urgency.  Musculoskeletal:  Negative for back pain, joint pain and myalgias.  Skin:  Negative for rash.  Neurological:  Positive for dizziness. Negative for tingling, focal weakness, seizures, weakness and headaches.  Endo/Heme/Allergies:  Does not bruise/bleed easily.  Psychiatric/Behavioral:  Negative for depression and suicidal ideas. The patient does not have insomnia.       Allergies  Allergen Reactions   Atorvastatin Other (See Comments)    Dizziness   Lisinopril Cough     Past Medical History:  Diagnosis Date   Adrenal nodule (HCC) 12/28/2021   a.) CT abd/pel 12/28/2021: LEFT adrenal nodule measuring 1.6 cm   Anemia    Angina    Anxiety    Aortic atherosclerosis (HCC)    Arthritis    Atrial fibrillation and flutter (HCC) 2013   a.) CHA2DS2VASc = 5 (age, CHF, HTN, vascular disease history, T2DM);  b.) s/p DCCV (100 J) 10/27/2010; c.) s/p ablation 09/21/2011; d.) rate/rhythm maintained on oral amiodarone + carvedilol; chronically  anticoagulated with warfarin   CAD (coronary artery disease) 07/15/2010   a.) LHC 07/15/2010: 30% pLAD, 40% D1, 25%/30% pLCx, 30% mLCx - med mgmt   CHF (congestive heart failure) - NYHA Class II/III 2013   a.) TTE 02/26/2013: EF 50%, mild MR/TR; b.)  EST 06/13/2017: EF 50%, mild LCH, triv MR/TR; c.) TTTE 03/24/2020: EF >55%, mild LVH, mild MAC, mild BAE, triv PR, mild MR/TR, G1DD; d.) TTE 11/19/2021: EF 60-65%, mod reduced RVSF, mild LAE, mod MAC, Ao root 37 mm, G2DD   COPD (chronic obstructive pulmonary disease) (HCC)    Diabetes type 2, uncontrolled    declines DSME   Dilated cardiomyopathy (HCC) 2013   a.) EF 25% at time of Dx in 2013; b.) TTE 02/26/2013: EF 50%; c.) EST 06/13/2017: EF 50%;  d.) TTE 03/24/2020: EF >55%; e.) TTE 11/19/2021: EF 60-65%   Ex-smoker    GERD (gastroesophageal reflux disease)    Hepatic steatosis    Hyperlipidemia    Hypertension    Infrarenal abdominal aortic aneurysm (AAA) without rupture (HCC) 08/11/2016   a.) CT abd/pel 08/11/2016: 3.3 cm; b.) CT abd/pel 12/22/2018: 3.3 cm; c.) CT abd/pel 08/31/2021: 3.7 cm; d.) CT abd/pel 12/28/2021: 3.9 cm   Long term current use of amiodarone 2023   a.) previously on dranaderone (cost prohibitive); switched to amiodarone 2023   Long term current use of anticoagulant    a.) warfarin   Marijuana use    a.) UDS (+) of THC x 3 (02/06/2013, 06/29/2013, 09/03/2013); PCP advised "one more and we will stop prescribing ativan (08/2013)"   Migraine    Narcolepsy    Obesity    OSA (obstructive sleep apnea)    a.) unable to tolerate nocturnal PAP therapy; uses supplemetal oxygen at 2L/Sublimity during sleep   Seasonal allergies    Umbilical hernia      Past Surgical History:  Procedure Laterality Date   ATRIAL FLUTTER ABLATION N/A 09/21/2011   Procedure: ATRIAL FLUTTER ABLATION;  Surgeon: Hillis Range, MD;  Location[facility] CATH LAB;  Service: Cardiovascular;  Laterality: N/A;   CARDIAC ELECTROPHYSIOLOGY MAPPING AND ABLATION  2013   for atrial flutter   CARDIOVASCULAR STRESS TEST  03/2012   ETT WNL Gwen Pounds)   CARDIOVERSION N/A 08/12/2021   Procedure: CARDIOVERSION;  Surgeon: Lamar Blinks, MD;  Location: [facility] ORS;  Service: Cardiovascular;  Laterality: N/A;   COLONOSCOPY  WITH PROPOFOL N/A 05/22/2020   TA, diverticulosis, descending colon ulcer biopsy WNL, int hem Servando Snare, Darren, MD)   COLONOSCOPY WITH PROPOFOL N/A 04/28/2022   multiple TAs - 10 small, 2 1.2cm, 2 9mm, rpt 3 yrs (Vanga, Loel Dubonnet, MD)   CYST EXCISION N/A 08/05/2022   excision of back cysts x 2 (pathology returned epidermal inclusion cysts);  Surgeon: Henrene Dodge, MD   ESOPHAGOGASTRODUODENOSCOPY (EGD) WITH PROPOFOL N/A 05/22/2020   WNL (Wohl)   ESOPHAGOGASTRODUODENOSCOPY (EGD) WITH PROPOFOL N/A 04/28/2022   12mm duodenal TA, reactive gastropathy, enteric mucosa in stomach biopsy of unclear significance (Vanga, Loel Dubonnet, MD)   GIVENS CAPSULE STUDY  04/28/2022   Procedure: GIVENS CAPSULE STUDY;  Surgeon: Toney Reil, MD;  Location: [facility] ENDOSCOPY;  Service: Endoscopy;;   US ECHOCARDIOGRAPHY  2014   EF 50%, nl LV fxn, RV nl size/function, mild mitral insuff    Social History   Socioeconomic History   Marital status: Single    Spouse name: Not on file   Number of children: 0   Years of education: Not on file   Highest  education level: 12th grade  Occupational History   Occupation: disable   Tobacco Use   Smoking status: Former    Current packs/day: 0.00    Average packs/day: 1 pack/day for 31.0 years (31.0 ttl pk-yrs)    Types: Cigarettes    Start date: 04/27/1987    Quit date: 04/26/2018    Years since quitting: 4.8    Passive exposure: Past   Smokeless tobacco: Never  Vaping Use   Vaping status: Never Used  Substance and Sexual Activity   Alcohol use: No    Alcohol/week: 0.0 standard drinks of alcohol   Drug use: Yes    Types: Marijuana   Sexual activity: Not Currently  Other Topics Concern   Not on file  Social History Narrative   Pt lives in [place]   Divorced   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   Disability due to neck arthritis, anxiety and sleep apnea   Edu: HS   Activity: no regular exercise, mows lawn (riding and push)   Diet: good water,  fruits/vegetables daily   Social Determinants of Health   Financial Resource Strain: Low Risk  (08/29/2022)   Overall Financial Resource Strain (CARDIA)    Difficulty of Paying Living Expenses: Not very hard  Food Insecurity: No Food Insecurity (08/29/2022)   Hunger Vital Sign    Worried About Running Out of Food in the Last Year: Never true    Ran Out of Food in the Last Year: Never true  Transportation Needs: No Transportation Needs (08/29/2022)   PRAPARE - Administrator, Civil Service (Medical): No    Lack of Transportation (Non-Medical): No  Recent Concern: Transportation Needs - Unmet Transportation Needs (08/13/2022)   PRAPARE - Transportation    Lack of Transportation (Medical): Yes    Lack of Transportation (Non-Medical): Yes  Physical Activity: Unknown (08/29/2022)   Exercise Vital Sign    Days of Exercise per Week: 0 days    Minutes of Exercise per Session: Not on file  Recent Concern: Physical Activity - Inactive (08/29/2022)   Exercise Vital Sign    Days of Exercise per Week: 0 days    Minutes of Exercise per Session: 0 min  Stress: No Stress Concern Present (08/29/2022)   [employer] of Occupational Health - Occupational Stress Questionnaire    Feeling of Stress : Not at all  Social Connections: Socially Isolated (08/29/2022)   Social Connection and Isolation Panel [NHANES]    Frequency of Communication with Friends and Family: More than three times a week    Frequency of Social Gatherings with Friends and Family: Never    Attends Religious Services: Never    Database administrator or Organizations: No    Attends Engineer, structural: Not on file    Marital Status: Divorced  Intimate Partner Violence: Not At Risk (07/09/2020)   Humiliation, Afraid, Rape, and Kick questionnaire    Fear of Current or Ex-Partner: No    Emotionally Abused: No    Physically Abused: No    Sexually Abused: No    Family History  Problem Relation Age of Onset   Heart  failure Father 53   Cancer Mother 57       NHL   Hypertension Maternal Grandfather    CAD Maternal Grandfather 36       MI   Diabetes Other        grandparents   Cancer Brother 32       lung (smoker)  Current Outpatient Medications:    ACCU-CHEK FASTCLIX LANCETS MISC, Check blood sugar once daily and as instructed. Dx 250.00, Disp: 100 each, Rfl: 3   acetaminophen (TYLENOL) 500 MG tablet, Take 2 tablets (1,000 mg total) by mouth every 6 (six) hours as needed for mild pain., Disp: , Rfl:    albuterol (VENTOLIN HFA) 108 (90 Base) MCG/ACT inhaler, Inhale 2 puffs into the lungs every 6 (six) hours as needed for wheezing or shortness of breath., Disp: 8.5 each, Rfl: 6   amiodarone (PACERONE) 200 MG tablet, Take 1 tablet (200 mg total) by mouth daily., Disp: 90 tablet, Rfl: 3   Blood Glucose Monitoring Suppl (BLOOD GLUCOSE MONITOR SYSTEM) W/DEVICE KIT, by Does not apply route. Use to check sugar once daily and as needed Dx: E11.9 **ONE TOUCH VERIO**, Disp: , Rfl:    Budeson-Glycopyrrol-Formoterol (BREZTRI AEROSPHERE) 160-9-4.8 MCG/ACT AERO, Inhale 2 puffs into the lungs 2 (two) times daily., Disp: 32.1 g, Rfl: 3   Cholecalciferol (VITAMIN D) 50 MCG (2000 UT) CAPS, Take 1 capsule (2,000 Units total) by mouth daily., Disp: 30 capsule, Rfl:    dapagliflozin propanediol (FARXIGA) 10 MG TABS tablet, Take 1 tablet (10 mg total) by mouth daily before breakfast., Disp: 90 tablet, Rfl: 3   Docusate Calcium (STOOL SOFTENER PO), Take 1 tablet by mouth daily., Disp: , Rfl:    FLUoxetine (PROZAC) 40 MG capsule, TAKE 1 CAPSULE (40 MG TOTAL) BY MOUTH DAILY., Disp: 90 capsule, Rfl: 3   fluticasone (FLONASE) 50 MCG/ACT nasal spray, Place 2 sprays into both nostrils daily. (Patient taking differently: Place 2 sprays into both nostrils as needed.), Disp: 48 mL, Rfl: 3   furosemide (LASIX) 80 MG tablet, TAKE 1 TABLET BY MOUTH 2 TIMES DAILY., Disp: 180 tablet, Rfl: 4   glimepiride (AMARYL) 4 MG tablet, TAKE 1  TABLET BY MOUTH EVERY DAY WITH BREAKFAST, Disp: 90 tablet, Rfl: 1   ipratropium-albuterol (DUONEB) 0.5-2.5 (3) MG/3ML SOLN, TAKE 3 MLS BY NEBULIZATION EVERY 6 (SIX) HOURS AS NEEDED (SEVERE SHORTNESS OF BREATH/WHEEZING)., Disp: 360 mL, Rfl: 0   metFORMIN (GLUCOPHAGE) 500 MG tablet, TAKE 1 TABLET BY MOUTH 2 TIMES DAILY WITH A MEAL., Disp: 180 tablet, Rfl: 1   methocarbamol (ROBAXIN) 500 MG tablet, Take 1 tablet (500 mg total) by mouth 3 (three) times daily as needed for muscle spasms (sedation precautions)., Disp: 30 tablet, Rfl: 0   montelukast (SINGULAIR) 10 MG tablet, TAKE 1 TABLET BY MOUTH EVERYDAY AT BEDTIME, Disp: 90 tablet, Rfl: 3   ondansetron (ZOFRAN) 4 MG tablet, Take 1 tablet (4 mg total) by mouth daily as needed for nausea or vomiting., Disp: 30 tablet, Rfl: 1   ONETOUCH VERIO test strip, CHECK BLOOD SUGAR 3 TIMES A DAY, Disp: 300 strip, Rfl: 3   OXYGEN, Inhale 2 L into the lungs at bedtime., Disp: , Rfl:    pantoprazole (PROTONIX) 40 MG tablet, TAKE 1 TABLET (40 MG TOTAL) BY MOUTH TWICE A DAY BEFORE MEALS, Disp: 180 tablet, Rfl: 4   potassium chloride (MICRO-K) 10 MEQ CR capsule, Take 1 capsule (10 mEq total) by mouth daily., Disp: 90 capsule, Rfl: 3   simvastatin (ZOCOR) 20 MG tablet, TAKE 1 TABLET BY MOUTH EVERY DAY IN THE EVENING, Disp: 90 tablet, Rfl: 1   triamcinolone cream (KENALOG) 0.1 %, Apply 1 Application topically 2 (two) times daily. For no more than 10 days at a time, Disp: 45 g, Rfl: 0   vitamin B-12 (CYANOCOBALAMIN) 1000 MCG tablet, Take 1 tablet (1,000 mcg total)  by mouth every Monday, Wednesday, and Friday., Disp: , Rfl:    warfarin (COUMADIN) 5 MG tablet, TAKE 1/2 TABLET BY MOUTH DAILY EXCEPT TAKE 1 TABLET ON MONDAYS, WEDNESDAYS AND FRIDAYS OR AS DIRECTED BY ANTICOAGULATION CLINIC, Disp: 60 tablet, Rfl: 1   carvedilol (COREG) 6.25 MG tablet, TAKE 1 TABLET BY MOUTH TWICE A DAY WITH FOOD, Disp: 180 tablet, Rfl: 1  Physical exam: There were no vitals filed for this  visit. Physical Exam Cardiovascular:     Rate and Rhythm: Normal rate and regular rhythm.     Heart sounds: Normal heart sounds.  Pulmonary:     Effort: Pulmonary effort is normal.     Breath sounds: Normal breath sounds.  Skin:    General: Skin is warm and dry.  Neurological:     Mental Status: He is alert and oriented to person, place, and time.         Latest Ref Rng & Units 08/05/2022   10:33 AM  CMP  Glucose 70 - 99 mg/dL 161   BUN 8 - 23 mg/dL 15   Creatinine 0.96 - 1.24 mg/dL 0.45   Sodium 409 - 811 mmol/L 139   Potassium 3.5 - 5.1 mmol/L 3.3   Chloride 98 - 111 mmol/L 92       Latest Ref Rng & Units 12/31/2022   11:52 AM  CBC  WBC 4.0 - 10.5 K/uL 8.0   Hemoglobin 13.0 - 17.0 g/dL 91.4   Hematocrit 78.2 - 52.0 % 34.7   Platelets 150 - 400 K/uL 181       Assessment and plan- Patient is a 67 y.o. male here for routine follow-up of iron deficiency anemia  Patient's hemoglobin is currently at baseline between 10 to1.  Iron studies from today are pending.  We will call him if he needs IV iron.  Repeat CBC ferritin and iron studies in 3 in 6 months and I will see him back in 6 months.  I will also check myeloma panel and serum free light chains and 3 months given that he had evidence of AKI with a creatinine of 1.5 in April 2024.    Gait imbalance/dizziness: I have asked him to discuss this with his primary care doctor and consideration for neurology referral    Visit Diagnosis 1. Other iron deficiency anemia      Dr. Owens Shark, MD, MPH [facility] [phone] 02/11/2023 1:38 PM

## 2023-02-11 NOTE — Progress Notes (Signed)
Patient here for oncology follow-up appointment, concerns of chronic SOB and dizziness

## 2023-02-17 ENCOUNTER — Ambulatory Visit: Payer: Medicare Other

## 2023-02-17 ENCOUNTER — Telehealth: Payer: Self-pay

## 2023-02-17 DIAGNOSIS — Z7901 Long term (current) use of anticoagulants: Secondary | ICD-10-CM | POA: Diagnosis not present

## 2023-02-17 LAB — POCT INR: INR: 4 — AB (ref 2.0–3.0)

## 2023-02-17 NOTE — Progress Notes (Addendum)
Pt reports SOB started this morning and he may have some fluid buildup. Pt denies any other changes. Symptomatic inhaler is helping. He reports this happens when he sits around the house and doesn't do anything except watch tv and smoke pot. Pt denied apt today because he will only see his PCP. Made apt for PCP for tomorrow. Pt was advised to go to ER if SOB worsens and if any changes in symptoms to contact office. Pt verbalized understanding.  Msg was sent to PCP with update.  Hold dose today and then change weekly dose to take 1 tablet daily except take 1/2 tablet on Wednesday.  Recheck in 2 weeks.

## 2023-02-17 NOTE — Telephone Encounter (Signed)
Noted! Thank you

## 2023-02-17 NOTE — Telephone Encounter (Signed)
Pt in coumadin clinic this morning. Pt reports SOB started this morning and he may have some fluid buildup. Pt denies any other changes. Symptomatic inhaler is helping. He reports this happens when he sits around the house and doesn't do anything except watch tv and smoke pot. Pt denied apt today because he will only see his PCP. Made apt for PCP for tomorrow. Pt was advised to go to ER if SOB worsens and if any changes in symptoms to contact office. Pt verbalized understanding.  Pt would not take an apt today with a different provider.

## 2023-02-17 NOTE — Patient Instructions (Addendum)
Pre visit review using our clinic review tool, if applicable. No additional management support is needed unless otherwise documented below in the visit note.  Hold dose today and then change weekly dose to take 1 tablet daily except take 1/2 tablet on Wednesday.  Recheck in 2 weeks.

## 2023-02-18 ENCOUNTER — Encounter: Payer: Self-pay | Admitting: Family Medicine

## 2023-02-18 ENCOUNTER — Ambulatory Visit (INDEPENDENT_AMBULATORY_CARE_PROVIDER_SITE_OTHER): Payer: Medicare Other | Admitting: Family Medicine

## 2023-02-18 VITALS — BP 150/84 | HR 66 | Temp 98.2°F | Ht 71.0 in | Wt 250.1 lb

## 2023-02-18 DIAGNOSIS — R0602 Shortness of breath: Secondary | ICD-10-CM | POA: Diagnosis not present

## 2023-02-18 DIAGNOSIS — Z7984 Long term (current) use of oral hypoglycemic drugs: Secondary | ICD-10-CM

## 2023-02-18 DIAGNOSIS — E1169 Type 2 diabetes mellitus with other specified complication: Secondary | ICD-10-CM | POA: Diagnosis not present

## 2023-02-18 DIAGNOSIS — R52 Pain, unspecified: Secondary | ICD-10-CM | POA: Diagnosis not present

## 2023-02-18 DIAGNOSIS — I5032 Chronic diastolic (congestive) heart failure: Secondary | ICD-10-CM

## 2023-02-18 LAB — POCT INFLUENZA A/B
Influenza A, POC: NEGATIVE
Influenza B, POC: NEGATIVE

## 2023-02-18 LAB — POC COVID19 BINAXNOW: SARS Coronavirus 2 Ag: NEGATIVE

## 2023-02-18 MED ORDER — POTASSIUM CHLORIDE ER 10 MEQ PO CPCR: 10.0000 meq | ORAL_CAPSULE | Freq: Every day | ORAL | 1 refills | Status: DC

## 2023-02-18 NOTE — Progress Notes (Signed)
Ph: [phone] Fax: [phone]   Patient ID: Roselyn Bering, male    DOB: 1955-07-02, 67 y.o.   MRN: [id number]  This visit was conducted in person.  BP (!) 150/84 (BP Location: Right Arm, Cuff Size: Large)   Pulse 66   Temp 98.2 F (36.8 C) (Oral)   Ht 5\' 11"  (1.803 m)   Wt 250 lb 2 oz (113.5 kg)   SpO2 98%   BMI 34.89 kg/m    CC: dyspnea, cough, body aches Subjective:   HPI: HERSHAL MACHON is a 67 y.o. male presenting on 02/18/2023 for Shortness of Breath (C/o SOB, cough, HA, chills and body aches. Sxs started 02/17/23. Sxs seems to have improved today except HA and body aches. )   1d h/o dyspnea, productive cough, frontal headache, chills and body aches. Woke up with dyspnea, chest tightness. Wonders if sinus inflammation contributing.   25 lb weight gain in the past 3 months. Attributes to poor diet and inactivity.  He states is only taking lasix 40mg  once daily, continues metolazone 2.5mg  weekly.  Denies leg swelling, new orthopnea. Chronically sleeps on 2 pillows at night.   No fevers.  No sick contacts.  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 Continues nocturnal oxygen supplementation.  Lab Results  Component Value Date   HGBA1C 6.1 (A) 11/15/2022    Regularly sees hematology, cardiology, recently saw pulmonology.  Cardiology stopped carvedilol.      Relevant past medical, surgical, family and social history reviewed and updated as indicated. Interim medical history since our last visit reviewed. Allergies and medications reviewed and updated. Outpatient Medications Prior to Visit  Medication Sig Dispense Refill   ACCU-CHEK FASTCLIX LANCETS MISC Check blood sugar once daily and as instructed. Dx 250.00 100 each 3   acetaminophen (TYLENOL) 500 MG tablet Take 2 tablets (1,000 mg total) by mouth every 6 (six) hours as needed for mild pain.      albuterol (VENTOLIN HFA) 108 (90 Base) MCG/ACT inhaler Inhale 2 puffs into the lungs every 6 (six) hours as needed for wheezing or shortness of breath. 8.5 each 6   amiodarone (PACERONE) 200 MG tablet Take 1 tablet (200 mg total) by mouth daily. 90 tablet 3   Blood Glucose Monitoring Suppl (BLOOD GLUCOSE MONITOR SYSTEM) W/DEVICE KIT by Does not apply route. Use to check sugar once daily and as needed Dx: E11.9 **ONE TOUCH VERIO**     Budeson-Glycopyrrol-Formoterol (BREZTRI AEROSPHERE) 160-9-4.8 MCG/ACT AERO Inhale 2 puffs into the lungs 2 (two) times daily. 32.1 g 3   Cholecalciferol (VITAMIN D) 50 MCG (2000 UT) CAPS Take 1 capsule (2,000 Units total) by mouth daily. 30 capsule    dapagliflozin propanediol (FARXIGA) 10 MG TABS tablet Take 1 tablet (10 mg total) by mouth daily before breakfast. 90 tablet 3   Docusate Calcium (STOOL SOFTENER PO) Take 1 tablet by mouth daily.     FLUoxetine (PROZAC) 40 MG capsule TAKE 1 CAPSULE (40 MG TOTAL) BY MOUTH DAILY. 90 capsule 3   fluticasone (FLONASE) 50 MCG/ACT nasal spray Place 2 sprays into both nostrils daily. (Patient taking differently: Place 2 sprays into both nostrils as needed.) 48 mL 3   glimepiride (AMARYL) 4 MG tablet TAKE 1 TABLET BY MOUTH EVERY DAY WITH BREAKFAST 90 tablet 1   ipratropium-albuterol (DUONEB) 0.5-2.5 (3) MG/3ML SOLN TAKE 3 MLS BY NEBULIZATION EVERY 6 (SIX) HOURS AS NEEDED (SEVERE SHORTNESS  OF BREATH/WHEEZING). 360 mL 0   methocarbamol (ROBAXIN) 500 MG tablet Take 1 tablet (500 mg total) by mouth 3 (three) times daily as needed for muscle spasms (sedation precautions). 30 tablet 0   metolazone (ZAROXOLYN) 2.5 MG tablet Take 1 tablet (2.5 mg total) by mouth once a week. As needed     montelukast (SINGULAIR) 10 MG tablet TAKE 1 TABLET BY MOUTH EVERYDAY AT BEDTIME 90 tablet 3   ondansetron (ZOFRAN) 4 MG tablet Take 1 tablet (4 mg total) by mouth daily as needed for nausea or vomiting. 30 tablet 1   ONETOUCH VERIO test strip CHECK BLOOD  SUGAR 3 TIMES A DAY 300 strip 3   OXYGEN Inhale 2 L into the lungs at bedtime.     pantoprazole (PROTONIX) 40 MG tablet TAKE 1 TABLET (40 MG TOTAL) BY MOUTH TWICE A DAY BEFORE MEALS 180 tablet 4   simvastatin (ZOCOR) 20 MG tablet TAKE 1 TABLET BY MOUTH EVERY DAY IN THE EVENING 90 tablet 1   triamcinolone cream (KENALOG) 0.1 % Apply 1 Application topically 2 (two) times daily. For no more than 10 days at a time 45 g 0   vitamin B-12 (CYANOCOBALAMIN) 1000 MCG tablet Take 1 tablet (1,000 mcg total) by mouth every Monday, Wednesday, and Friday.     warfarin (COUMADIN) 5 MG tablet TAKE 1/2 TABLET BY MOUTH DAILY EXCEPT TAKE 1 TABLET ON MONDAYS, WEDNESDAYS AND FRIDAYS OR AS DIRECTED BY ANTICOAGULATION CLINIC 60 tablet 1   furosemide (LASIX) 80 MG tablet TAKE 1 TABLET BY MOUTH 2 TIMES DAILY. 180 tablet 4   metFORMIN (GLUCOPHAGE) 500 MG tablet TAKE 1 TABLET BY MOUTH 2 TIMES DAILY WITH A MEAL. 180 tablet 1   potassium chloride (MICRO-K) 10 MEQ CR capsule Take 1 capsule (10 mEq total) by mouth daily. 90 capsule 3   furosemide (LASIX) 80 MG tablet Take 1 tablet (80 mg total) by mouth daily. With second dose as needed for weight gain, leg swelling     metFORMIN (GLUCOPHAGE) 500 MG tablet Take 1 tablet (500 mg total) by mouth daily with breakfast.     carvedilol (COREG) 6.25 MG tablet TAKE 1 TABLET BY MOUTH TWICE A DAY WITH FOOD 180 tablet 1   furosemide (LASIX) 40 MG tablet Take 1 tablet (40 mg total) by mouth daily.     No facility-administered medications prior to visit.     Per HPI unless specifically indicated in ROS section below Review of Systems  Objective:  BP (!) 150/84 (BP Location: Right Arm, Cuff Size: Large)   Pulse 66   Temp 98.2 F (36.8 C) (Oral)   Ht 5\' 11"  (1.803 m)   Wt 250 lb 2 oz (113.5 kg)   SpO2 98%   BMI 34.89 kg/m   Wt Readings from Last 3 Encounters:  02/18/23 250 lb 2 oz (113.5 kg)  02/11/23 245 lb (111.1 kg)  01/24/23 236 lb 6.4 oz (107.2 kg)      Physical  Exam Vitals and nursing note reviewed.  Constitutional:      Appearance: Normal appearance. He is not ill-appearing.  HENT:     Head: Normocephalic and atraumatic.     Right Ear: Hearing, tympanic membrane, ear canal and external ear normal. There is no impacted cerumen.     Left Ear: Hearing, tympanic membrane, ear canal and external ear normal. There is no impacted cerumen.     Nose: No mucosal edema.     Right Turbinates: Not enlarged or swollen.  Left Turbinates: Not enlarged or swollen.     Right Sinus: No maxillary sinus tenderness or frontal sinus tenderness.     Left Sinus: No maxillary sinus tenderness or frontal sinus tenderness.     Mouth/Throat:     Mouth: Mucous membranes are moist.     Pharynx: Oropharynx is clear. No oropharyngeal exudate or posterior oropharyngeal erythema.  Eyes:     Extraocular Movements: Extraocular movements intact.     Conjunctiva/sclera: Conjunctivae normal.     Pupils: Pupils are equal, round, and reactive to light.  Cardiovascular:     Rate and Rhythm: Normal rate and regular rhythm.     Pulses: Normal pulses.     Heart sounds: Normal heart sounds. No murmur heard. Pulmonary:     Effort: Pulmonary effort is normal. No respiratory distress.     Breath sounds: No wheezing, rhonchi or rales.     Comments: Crackles bibasilarly Musculoskeletal:     Cervical back: Normal range of motion and neck supple. No rigidity.     Right lower leg: Edema (tr) present.     Left lower leg: Edema (tr) present.  Lymphadenopathy:     Cervical: No cervical adenopathy.  Skin:    General: Skin is warm and dry.     Findings: No rash.  Neurological:     Mental Status: He is alert.  Psychiatric:        Mood and Affect: Mood normal.        Behavior: Behavior normal.       Results for orders placed or performed in visit on 02/18/23  POC COVID-19 BinaxNow  Result Value Ref Range   SARS Coronavirus 2 Ag Negative Negative  POCT Influenza A/B  Result Value  Ref Range   Influenza A, POC Negative Negative   Influenza B, POC Negative Negative   Lab Results  Component Value Date   NA 139 08/05/2022   CL 92 (L) 08/05/2022   K 3.3 (L) 08/05/2022   CO2 36 (H) 07/26/2022   BUN 15 08/05/2022   CREATININE 1.10 08/05/2022   GFR 46.60 (L) 07/26/2022   CALCIUM 9.9 07/26/2022   PHOS 3.0 08/04/2021   ALBUMIN 4.3 07/26/2022   GLUCOSE 156 (H) 08/05/2022   Lab Results  Component Value Date   HGBA1C 6.1 (A) 11/15/2022    Assessment & Plan:   Problem List Items Addressed This Visit     DYSPNEA - Primary    Suspect due to mild acute dCHF exacerbation given trace pedal edema and bibasilar crackles.  Will increase lasix to BID dosing (and potassium).  Update with effect.       Relevant Orders   POC COVID-19 BinaxNow (Completed)   Type 2 diabetes mellitus with other specified complication (HCC)    Chronic, overall stable. He states good sugar control at home so he's dropped metformin dose to 500mg  once daily.  Will reassess control at CPE in 3 months.       Relevant Medications   metFORMIN (GLUCOPHAGE) 500 MG tablet   Chronic diastolic heart failure (HCC)    He states he only takes lasix once daily - unsure if 40mg  or 80mg  dose --> confirmed he's taking 80mg  tablets once daily.  See below re: acute dCHF exacerbation treatment plan.       Relevant Medications   metolazone (ZAROXOLYN) 2.5 MG tablet   furosemide (LASIX) 80 MG tablet   Other Visit Diagnoses     Body aches       Relevant  Orders   POCT Influenza A/B (Completed)        Meds ordered this encounter  Medications   potassium chloride (MICRO-K) 10 MEQ CR capsule    Sig: Take 1 capsule (10 mEq total) by mouth daily.    Dispense:  110 capsule    Refill:  1    Take second dose on days you take second lasix dose    Orders Placed This Encounter  Procedures   POC COVID-19 BinaxNow    Order Specific Question:   Previously tested for COVID-19    Answer:   No    Order  Specific Question:   Resident in a congregate (group) care setting    Answer:   No    Order Specific Question:   Employed in healthcare setting    Answer:   No   POCT Influenza A/B    Patient Instructions  Let us know what dose of lasix you're taking.  25 lb weight gain noted in the past 3 months. Increase lasix to twice daily for the next 3-5 days, as well as potassium pills to twice daily for 3-5 days.  Return in 3 months for physical/wellness visit.   Follow up plan: Return for annual exam, prior fasting for blood work, medicare wellness visit.  Eustaquio Boyden, MD

## 2023-02-18 NOTE — Assessment & Plan Note (Addendum)
Chronic, overall stable. He states good sugar control at home so he's dropped metformin dose to 500mg  once daily.  Will reassess control at CPE in 3 months.

## 2023-02-18 NOTE — Patient Instructions (Addendum)
Let us know what dose of lasix you're taking.  25 lb weight gain noted in the past 3 months. Increase lasix to twice daily for the next 3-5 days, as well as potassium pills to twice daily for 3-5 days.  Return in 3 months for physical/wellness visit.

## 2023-02-18 NOTE — Assessment & Plan Note (Signed)
Suspect due to mild acute dCHF exacerbation given trace pedal edema and bibasilar crackles.  Will increase lasix to BID dosing (and potassium).  Update with effect.

## 2023-02-18 NOTE — Assessment & Plan Note (Addendum)
He states he only takes lasix once daily - unsure if 40mg  or 80mg  dose --> confirmed he's taking 80mg  tablets once daily.  See below re: acute dCHF exacerbation treatment plan.

## 2023-03-03 ENCOUNTER — Ambulatory Visit: Payer: Medicare Other

## 2023-03-03 DIAGNOSIS — Z7901 Long term (current) use of anticoagulants: Secondary | ICD-10-CM | POA: Diagnosis not present

## 2023-03-03 LAB — POCT INR: INR: 3.1 — AB (ref 2.0–3.0)

## 2023-03-03 MED ORDER — WARFARIN SODIUM 5 MG PO TABS: ORAL_TABLET | ORAL | 1 refills | Status: DC

## 2023-03-03 NOTE — Patient Instructions (Addendum)
Pre visit review using our clinic review tool, if applicable. No additional management support is needed unless otherwise documented below in the visit note.  Reduce dose tomorrow to take 1/2 tablet and then continue 1 tablet daily. Recheck in 1 week.

## 2023-03-03 NOTE — Progress Notes (Signed)
Pt already took warfarin today. Pt reported he thought he was told to take an extra 1/2 tablet the day of his last apt and that he was supposed to take 1 tablet daily until his apt today. It is thought that pt had edema at last INR check and that is why he had the supratherapeutic INR at that apt. Edema has now been corrected and is no longer causing elevated INR.  Due to all of these factors, we will reduce dose tomorrow but then resume what pt has been taking for the last 2 weeks, which is 1 tablet daily. Will recheck INR in 1 week.  Reduce dose tomorrow to take 1/2 tablet and then continue 1 tablet daily. Recheck in 1 week.

## 2023-03-10 ENCOUNTER — Ambulatory Visit: Payer: Medicare Other

## 2023-03-10 ENCOUNTER — Telehealth: Payer: Self-pay

## 2023-03-10 DIAGNOSIS — Z7901 Long term (current) use of anticoagulants: Secondary | ICD-10-CM

## 2023-03-10 LAB — POCT INR: INR: 3.7 — AB (ref 2.0–3.0)

## 2023-03-10 NOTE — Telephone Encounter (Signed)
Pt LVM reporting [facility] said he could not get warfarin until Dec.  Contacted [facility] and they report pt can pick up script in 2 days, per insurance. They report pt is aware now.   LVM for pt concerning picking up scrip in 2 days.

## 2023-03-10 NOTE — Progress Notes (Signed)
Pt reports he has been having difficulty falling asleep and has been smoking more marijuana to try to help him sleep. Pt also reports he has had a productive cough x 1 week. Advised pt to make apt with PCP to be assessed. Advised he could be holding fluid also and he needs an apt for assessment. Pt would not make an apt but reported he will if the cough lingers for much longer or if it worsens. Advised if it worsens or he develops any new symptoms to contact the clinic to make an apt. Pt verbalized understanding.  Hold dose today and then change weekly dose to take 1 tablet daily except take 1/2 tablet on Wednesdays. Recheck in 3 weeks.

## 2023-03-10 NOTE — Patient Instructions (Addendum)
Pre visit review using our clinic review tool, if applicable. No additional management support is needed unless otherwise documented below in the visit note.  Hold dose today and then change weekly dose to take 1 tablet daily except take 1/2 tablet on Wednesdays. Recheck in 3 weeks.  

## 2023-03-11 NOTE — Telephone Encounter (Signed)
This encounter was created in error - please disregard.

## 2023-03-31 ENCOUNTER — Ambulatory Visit: Payer: Medicare Other

## 2023-03-31 DIAGNOSIS — Z7901 Long term (current) use of anticoagulants: Secondary | ICD-10-CM

## 2023-03-31 LAB — POCT INR: INR: 4.5 — AB (ref 2.0–3.0)

## 2023-03-31 NOTE — Patient Instructions (Addendum)
Pre visit review using our clinic review tool, if applicable. No additional management support is needed unless otherwise documented below in the visit note.  Hold dose tomorrow and then change weekly dose to take 1 tablet daily except take 1/2 tablet on Wednesdays and Saturdays. Recheck in 2 weeks.

## 2023-03-31 NOTE — Progress Notes (Addendum)
Pt reports continued use of marijuana, weight gain, difficulty sleeping, and eating a poor diet. Pt reports he thinks he needs lab work. Made apt with PCP for 12/9. Pt reports he did not change his weekly dose after the last visit. He continue taking 1 tablet daily. Pt already took warfarin today.  Hold dose tomorrow and then change weekly dose to take 1 tablet daily except take 1/2 tablet on Wednesdays and Saturdays. Recheck in 2 weeks.

## 2023-04-04 ENCOUNTER — Ambulatory Visit: Payer: Medicare Other | Admitting: Family Medicine

## 2023-04-14 ENCOUNTER — Ambulatory Visit: Payer: Medicare Other

## 2023-04-14 DIAGNOSIS — Z7901 Long term (current) use of anticoagulants: Secondary | ICD-10-CM

## 2023-04-14 LAB — POCT INR: INR: 3.7 — AB (ref 2.0–3.0)

## 2023-04-14 NOTE — Progress Notes (Signed)
Pt reports he forgot to change Saturdays to 1/2 tablet, he continued to take 1 tablet, after last apt. He has been only taking 1/2 tablet on Wednesdays.  Will hold dose today and then try the prior weekly dosing since pt has not been following those instructions.  Hold dose today and then change weekly dose to take 1 tablet daily except take 1/2 tablet on Mondays and Saturdays. Recheck in 2 weeks.

## 2023-04-14 NOTE — Patient Instructions (Addendum)
Pre visit review using our clinic review tool, if applicable. No additional management support is needed unless otherwise documented below in the visit note.  Hold dose today and then change weekly dose to take 1 tablet daily except take 1/2 tablet on Mondays and Saturdays. Recheck in 2 weeks.

## 2023-04-21 DIAGNOSIS — I5032 Chronic diastolic (congestive) heart failure: Secondary | ICD-10-CM | POA: Diagnosis not present

## 2023-04-21 DIAGNOSIS — R002 Palpitations: Secondary | ICD-10-CM | POA: Diagnosis not present

## 2023-04-21 DIAGNOSIS — I480 Paroxysmal atrial fibrillation: Secondary | ICD-10-CM | POA: Diagnosis not present

## 2023-04-21 DIAGNOSIS — R0609 Other forms of dyspnea: Secondary | ICD-10-CM | POA: Diagnosis not present

## 2023-04-28 ENCOUNTER — Ambulatory Visit: Payer: Medicare Other

## 2023-04-28 DIAGNOSIS — Z7901 Long term (current) use of anticoagulants: Secondary | ICD-10-CM

## 2023-04-28 LAB — POCT INR: INR: 5.1 — AB (ref 2.0–3.0)

## 2023-04-28 NOTE — Progress Notes (Addendum)
 Pt is reporting fluid gain, approximately 2 lbs in a day. Advised to f/u with PCP concerning diuretic. Pt has increased lasix  to BID two days ago but no weight loss has been seen. Pt reports elevated glucose also. Made apt with PCP for 1/13, soonest available. Advised if any change in fluid gain or increase in SOB to contact office or go to ER. Pt verbalized understanding. Pt also requested help re-applying his cardiac monitor, which was placed on 12/26. He reports he is to wear for 3 weeks and then mail in. The monitor came off in the shower this morning. His box for the monitor supplies contained two more adhesives with electrode. Read instructions and placed new monitor. Advised pt to charge device before apply to the monitor since it was beeping. Advised if any further problems with the monitor to contact cardiology. Pt verbalized understanding.   Hold dose today and hold dose tomorrow and then change weekly dose to take 1 tablet daily except take 1/2 tablet on Mondays, Wednesdays and Saturdays. Recheck in 2 weeks.

## 2023-04-28 NOTE — Patient Instructions (Addendum)
 Pre visit review using our clinic review tool, if applicable. No additional management support is needed unless otherwise documented below in the visit note.  Hold dose today and hold dose tomorrow and then change weekly dose to take 1 tablet daily except take 1/2 tablet on Mondays, Wednesdays and Saturdays. Recheck in 2 weeks.

## 2023-05-09 ENCOUNTER — Telehealth: Payer: Self-pay | Admitting: Family Medicine

## 2023-05-09 ENCOUNTER — Ambulatory Visit: Payer: Medicare Other | Admitting: Family Medicine

## 2023-05-09 NOTE — Telephone Encounter (Signed)
 Pt reports his sister, who resides in [place] , is not doing well and he is not sure she will survive much longer. He has made arrangements to to see her and will not return in time for his coumadin  clinic apt on 1/16. Pt requested reschedule for next week, 1/23 at 11 am. Advised if any changes to contact the coumadin  clinic. Gave sympathy for sister's illness. Pt verbalized understanding.

## 2023-05-09 NOTE — Telephone Encounter (Signed)
 Noted thank you. I'm sorry to hear about his sister's illness.  Would call to offer reschedule f/u appt with me for next week after he sees his cardiologist.

## 2023-05-09 NOTE — Telephone Encounter (Signed)
 Received CRM stating pt wanted to cancel coumadin appt on 1/16 with [facility]. No notes on if pt wanted to r/s or not. Call back # [phone]

## 2023-05-09 NOTE — Telephone Encounter (Signed)
 See my previous message

## 2023-05-09 NOTE — Telephone Encounter (Signed)
 LVM for pt to return call to [facility] coumadin clinic apt. Appears from chart pt also cancelled PCP apt for today.

## 2023-05-10 NOTE — Telephone Encounter (Signed)
 Spoke to pt, r/s appt to 05/20/23

## 2023-05-12 ENCOUNTER — Ambulatory Visit: Payer: Medicare Other

## 2023-05-13 ENCOUNTER — Other Ambulatory Visit: Payer: Medicare Other

## 2023-05-18 ENCOUNTER — Inpatient Hospital Stay: Payer: Medicare Other | Attending: Oncology

## 2023-05-18 DIAGNOSIS — D509 Iron deficiency anemia, unspecified: Secondary | ICD-10-CM | POA: Diagnosis not present

## 2023-05-18 DIAGNOSIS — D508 Other iron deficiency anemias: Secondary | ICD-10-CM

## 2023-05-18 LAB — IRON AND TIBC
Iron: 22 ug/dL — ABNORMAL LOW (ref 45–182)
Saturation Ratios: 5 % — ABNORMAL LOW (ref 17.9–39.5)
TIBC: 470 ug/dL — ABNORMAL HIGH (ref 250–450)
UIBC: 448 ug/dL

## 2023-05-18 LAB — CMP (CANCER CENTER ONLY)
ALT: 16 U/L (ref 0–44)
AST: 17 U/L (ref 15–41)
Albumin: 4 g/dL (ref 3.5–5.0)
Alkaline Phosphatase: 80 U/L (ref 38–126)
Anion gap: 12 (ref 5–15)
BUN: 17 mg/dL (ref 8–23)
CO2: 32 mmol/L (ref 22–32)
Calcium: 8.8 mg/dL — ABNORMAL LOW (ref 8.9–10.3)
Chloride: 97 mmol/L — ABNORMAL LOW (ref 98–111)
Creatinine: 1.4 mg/dL — ABNORMAL HIGH (ref 0.61–1.24)
GFR, Estimated: 55 mL/min — ABNORMAL LOW (ref >60)
Glucose, Bld: 229 mg/dL — ABNORMAL HIGH (ref 70–99)
Potassium: 3.7 mmol/L (ref 3.5–5.1)
Sodium: 141 mmol/L (ref 135–145)
Total Bilirubin: 0.5 mg/dL (ref 0.0–1.2)
Total Protein: 7.1 g/dL (ref 6.5–8.1)

## 2023-05-18 LAB — FERRITIN: Ferritin: 6 ng/mL — ABNORMAL LOW (ref 24–336)

## 2023-05-19 ENCOUNTER — Ambulatory Visit: Payer: Medicare Other

## 2023-05-19 DIAGNOSIS — Z7901 Long term (current) use of anticoagulants: Secondary | ICD-10-CM

## 2023-05-19 LAB — POCT INR: INR: 1.9 — AB (ref 2.0–3.0)

## 2023-05-19 LAB — KAPPA/LAMBDA LIGHT CHAINS
Kappa free light chain: 30.6 mg/L — ABNORMAL HIGH (ref 3.3–19.4)
Kappa, lambda light chain ratio: 1.94 — ABNORMAL HIGH (ref 0.26–1.65)
Lambda free light chains: 15.8 mg/L (ref 5.7–26.3)

## 2023-05-19 NOTE — Patient Instructions (Addendum)
Pre visit review using our clinic review tool, if applicable. No additional management support is needed unless otherwise documented below in the visit note.  Increase dose today to take 1 1/2 tablets and then continue 1 tablet daily except take 1/2 tablet on Mondays, Wednesdays and Saturdays. Recheck in 1 weeks.

## 2023-05-19 NOTE — Progress Notes (Signed)
Pt missed three doses due to being out of town and forgetting all of his medication. Increase dose today to take 1 1/2 tablets and then continue 1 tablet daily except take 1/2 tablet on Mondays, Wednesdays and Saturdays. Recheck in 1 weeks.

## 2023-05-20 ENCOUNTER — Ambulatory Visit: Payer: Medicare Other | Admitting: Family Medicine

## 2023-05-23 LAB — MULTIPLE MYELOMA PANEL, SERUM
Albumin SerPl Elph-Mcnc: 3.5 g/dL (ref 2.9–4.4)
Albumin/Glob SerPl: 1.3 (ref 0.7–1.7)
Alpha 1: 0.2 g/dL (ref 0.0–0.4)
Alpha2 Glob SerPl Elph-Mcnc: 0.9 g/dL (ref 0.4–1.0)
B-Globulin SerPl Elph-Mcnc: 1.1 g/dL (ref 0.7–1.3)
Gamma Glob SerPl Elph-Mcnc: 0.6 g/dL (ref 0.4–1.8)
Globulin, Total: 2.8 g/dL (ref 2.2–3.9)
IgA: 222 mg/dL (ref 61–437)
IgG (Immunoglobin G), Serum: 699 mg/dL (ref 603–1613)
IgM (Immunoglobulin M), Srm: 48 mg/dL (ref 20–172)
Total Protein ELP: 6.3 g/dL (ref 6.0–8.5)

## 2023-05-26 ENCOUNTER — Encounter: Payer: Self-pay | Admitting: Oncology

## 2023-05-26 ENCOUNTER — Inpatient Hospital Stay: Payer: Medicare Other

## 2023-05-26 ENCOUNTER — Ambulatory Visit: Payer: Medicare Other

## 2023-05-26 ENCOUNTER — Other Ambulatory Visit: Payer: Self-pay | Admitting: Nurse Practitioner

## 2023-05-26 VITALS — BP 129/72 | HR 63 | Temp 98.5°F | Resp 18

## 2023-05-26 DIAGNOSIS — Z7901 Long term (current) use of anticoagulants: Secondary | ICD-10-CM | POA: Diagnosis not present

## 2023-05-26 DIAGNOSIS — D509 Iron deficiency anemia, unspecified: Secondary | ICD-10-CM | POA: Diagnosis not present

## 2023-05-26 DIAGNOSIS — D508 Other iron deficiency anemias: Secondary | ICD-10-CM

## 2023-05-26 LAB — POCT INR: INR: 3.7 — AB (ref 2.0–3.0)

## 2023-05-26 MED ORDER — SODIUM CHLORIDE 0.9 % IV SOLN
510.0000 mg | Freq: Once | INTRAVENOUS | Status: AC
  Administered 2023-05-26: 510 mg via INTRAVENOUS
  Filled 2023-05-26: qty 510

## 2023-05-26 NOTE — Progress Notes (Addendum)
Pt reports increased lower leg edema but has been using diuretic. Pt has PCP apt next week. Hold dose today and then change weekly dose to take 1/2 tablet daily except take 1 tablet on Sunday, Tuesday and Thursday. Recheck in 2 weeks.

## 2023-05-26 NOTE — Patient Instructions (Addendum)
Pre visit review using our clinic review tool, if applicable. No additional management support is needed unless otherwise documented below in the visit note.  Hold dose today and then change weekly dose to take 1/2 tablet daily except take 1 tablet on Sunday, Tuesday and Thursday. Recheck in 2 weeks.

## 2023-05-31 ENCOUNTER — Ambulatory Visit: Payer: Medicare Other | Admitting: Family Medicine

## 2023-05-31 ENCOUNTER — Encounter: Payer: Self-pay | Admitting: Family Medicine

## 2023-05-31 VITALS — BP 152/70 | HR 68 | Temp 98.2°F | Ht 71.0 in | Wt 261.4 lb

## 2023-05-31 DIAGNOSIS — Z7984 Long term (current) use of oral hypoglycemic drugs: Secondary | ICD-10-CM | POA: Diagnosis not present

## 2023-05-31 DIAGNOSIS — J9611 Chronic respiratory failure with hypoxia: Secondary | ICD-10-CM

## 2023-05-31 DIAGNOSIS — I480 Paroxysmal atrial fibrillation: Secondary | ICD-10-CM | POA: Diagnosis not present

## 2023-05-31 DIAGNOSIS — D649 Anemia, unspecified: Secondary | ICD-10-CM | POA: Diagnosis not present

## 2023-05-31 DIAGNOSIS — E1169 Type 2 diabetes mellitus with other specified complication: Secondary | ICD-10-CM

## 2023-05-31 DIAGNOSIS — I5032 Chronic diastolic (congestive) heart failure: Secondary | ICD-10-CM

## 2023-05-31 DIAGNOSIS — R600 Localized edema: Secondary | ICD-10-CM | POA: Diagnosis not present

## 2023-05-31 DIAGNOSIS — I50810 Right heart failure, unspecified: Secondary | ICD-10-CM | POA: Diagnosis not present

## 2023-05-31 DIAGNOSIS — J9612 Chronic respiratory failure with hypercapnia: Secondary | ICD-10-CM

## 2023-05-31 LAB — BASIC METABOLIC PANEL
BUN: 13 mg/dL (ref 6–23)
CO2: 36 meq/L — ABNORMAL HIGH (ref 19–32)
Calcium: 9 mg/dL (ref 8.4–10.5)
Chloride: 100 meq/L (ref 96–112)
Creatinine, Ser: 1.41 mg/dL (ref 0.40–1.50)
GFR: 51.49 mL/min — ABNORMAL LOW (ref >60.00)
Glucose, Bld: 119 mg/dL — ABNORMAL HIGH (ref 70–99)
Potassium: 4.2 meq/L (ref 3.5–5.1)
Sodium: 143 meq/L (ref 135–145)

## 2023-05-31 LAB — CBC WITH DIFFERENTIAL/PLATELET
Basophils Absolute: 0.1 10*3/uL (ref 0.0–0.1)
Basophils Relative: 0.8 % (ref 0.0–3.0)
Eosinophils Absolute: 0.1 10*3/uL (ref 0.0–0.7)
Eosinophils Relative: 1.2 % (ref 0.0–5.0)
HCT: 28.9 % — ABNORMAL LOW (ref 39.0–52.0)
Hemoglobin: 8.6 g/dL — ABNORMAL LOW (ref 13.0–17.0)
Lymphocytes Relative: 10.8 % — ABNORMAL LOW (ref 12.0–46.0)
Lymphs Abs: 1 10*3/uL (ref 0.7–4.0)
MCHC: 29.7 g/dL — ABNORMAL LOW (ref 30.0–36.0)
MCV: 72.5 fL — ABNORMAL LOW (ref 78.0–100.0)
Monocytes Absolute: 0.8 10*3/uL (ref 0.1–1.0)
Monocytes Relative: 8.4 % (ref 3.0–12.0)
Neutro Abs: 7.6 10*3/uL (ref 1.4–7.7)
Neutrophils Relative %: 78.8 % — ABNORMAL HIGH (ref 43.0–77.0)
Platelets: 251 10*3/uL (ref 150.0–400.0)
RBC: 3.99 Mil/uL — ABNORMAL LOW (ref 4.22–5.81)
RDW: 18.2 % — ABNORMAL HIGH (ref 11.5–15.5)
WBC: 9.6 10*3/uL (ref 4.0–10.5)

## 2023-05-31 LAB — BRAIN NATRIURETIC PEPTIDE: Pro B Natriuretic peptide (BNP): 134 pg/mL — ABNORMAL HIGH (ref 0.0–100.0)

## 2023-05-31 LAB — HEMOGLOBIN A1C: Hgb A1c MFr Bld: 7.9 % — ABNORMAL HIGH (ref 4.6–6.5)

## 2023-05-31 MED ORDER — METHOCARBAMOL 500 MG PO TABS: 500.0000 mg | ORAL_TABLET | Freq: Two times a day (BID) | ORAL | 0 refills | Status: DC | PRN

## 2023-05-31 NOTE — Progress Notes (Signed)
 Ph: [phone] Fax: [phone]   Patient ID: [name], male    DOB: 12-16-1955, 68 y.o.   MRN: [id number]  This visit was conducted in person.  BP (!) 152/70   Pulse 68   Temp 98.2 F (36.8 C) (Oral)   Ht 5' 11 (1.803 m)   Wt 261 lb 6 oz (118.6 kg)   SpO2 (!) 89% Comment: Started pt on 2 L O2 in office  BMI 36.45 kg/m   O2 up to 96% on 2L [place] He states he has O2 at home but hasn't been using  CC: follow up visit  Subjective:   HPI: [name] is a 68 y.o. male presenting on 05/31/2023 for Medical Management of Chronic Issues (C/o more leg swelling than usual. Also, c/o feeling of throat closing and itching all over. )   Last seen 01/2023.  Increased stress - last month traveled to [facility] [place] to be with ill sister. She passed away last week. He had to cancel physical last month due to travel.   Sees [facility] cardiology Dr Wilburn for known atrial fibrillation on amiodarone  and coumadin  through our coumadin  clinic (see below), HFrecoveredEF, CAD. Last saw cards 04/21/2023 - at that time they ordered 2 wk cardiac monitor for new junctional rhythm. Continued amiodarone , coreg , dapagliflozin , statin, coumadin  and lasix . Plan was 4 wk f/u - has not scheduled this yet.   Continued weight gain noted - ~25 lbs from 7-01/2023, another 10 lbs since then. This is despite taking lasix  80mg  daily with 2nd dose PRN. Over the past 5 days he increased lasix  to 80mg  twice daily with some improvement. Also taking metolazone  2.5mg  once weekly. On potassium 10mEq daily.   Denies chest pain, abd pain, nausea/vomiting, diarrhea, fevers/chills.  Chronic dyspnea.  Feels throat tightness without tongue or lip swelling. No dysphagia, early satiety. Some cracking of neck.   Has home oxygen  to use when needed.   Also with h/o COPD, HLD, OSA intolerant of CPAP/BiPAP and anxiety as well as IDA declined further GI workup, received Feraheme  iron  infusion x1 05/26/2023 through [place]  heme/onc with plan to repeat on Thursday.   Several supratherapeutic coumadin  levels recently   Lab Results  Component Value Date   INR 3.7 (A) 05/26/2023   INR 1.9 (A) 05/19/2023   INR 5.1 (A) 04/28/2023    DM - on metformin  500mg  BID (increased dose due to hyperglycemia). Also continues amaryl  4mg  daily and farxiga  10mg  daily.  Lab Results  Component Value Date   HGBA1C 7.9 (H) 05/31/2023   Lab Results  Component Value Date   TSH 2.190 02/11/2023   On breztri  and farxiga  through [employer].      Relevant past medical, surgical, family and social history reviewed and updated as indicated. Interim medical history since our last visit reviewed. Allergies and medications reviewed and updated. Outpatient Medications Prior to Visit  Medication Sig Dispense Refill   ACCU-CHEK FASTCLIX LANCETS MISC Check blood sugar once daily and as instructed. Dx 250.00 100 each 3   acetaminophen  (TYLENOL ) 500 MG tablet Take 2 tablets (1,000 mg total) by mouth every 6 (six) hours as needed for mild pain.     albuterol  (VENTOLIN  HFA) 108 (90 Base) MCG/ACT inhaler Inhale 2 puffs into the lungs every 6 (six) hours as needed for wheezing or shortness of breath. 8.5 each 6   amiodarone  (PACERONE ) 200 MG tablet Take 1 tablet (200 mg total) by mouth daily. 90 tablet 3   Blood Glucose  Monitoring Suppl (BLOOD GLUCOSE MONITOR SYSTEM) W/DEVICE KIT by Does not apply route. Use to check sugar once daily and as needed Dx: E11.9 **ONE TOUCH VERIO**     Budeson-Glycopyrrol-Formoterol  (BREZTRI  AEROSPHERE) 160-9-4.8 MCG/ACT AERO Inhale 2 puffs into the lungs 2 (two) times daily. 32.1 g 3   Cholecalciferol  (VITAMIN D ) 50 MCG (2000 UT) CAPS Take 1 capsule (2,000 Units total) by mouth daily. 30 capsule    dapagliflozin  propanediol (FARXIGA ) 10 MG TABS tablet Take 1 tablet (10 mg total) by mouth daily before breakfast. 90 tablet 3   Docusate Calcium (STOOL SOFTENER PO) Take 1 tablet by mouth daily.     FLUoxetine  (PROZAC ) 40 MG  capsule TAKE 1 CAPSULE (40 MG TOTAL) BY MOUTH DAILY. 90 capsule 3   fluticasone  (FLONASE ) 50 MCG/ACT nasal spray Place 2 sprays into both nostrils daily. (Patient taking differently: Place 2 sprays into both nostrils as needed.) 48 mL 3   furosemide  (LASIX ) 80 MG tablet Take 1 tablet (80 mg total) by mouth daily. With second dose as needed for weight gain, leg swelling     glimepiride  (AMARYL ) 4 MG tablet TAKE 1 TABLET BY MOUTH EVERY DAY WITH BREAKFAST 90 tablet 1   ipratropium-albuterol  (DUONEB) 0.5-2.5 (3) MG/3ML SOLN TAKE 3 MLS BY NEBULIZATION EVERY 6 (SIX) HOURS AS NEEDED (SEVERE SHORTNESS OF BREATH/WHEEZING). 360 mL 0   metolazone  (ZAROXOLYN ) 2.5 MG tablet Take 1 tablet (2.5 mg total) by mouth once a week. As needed     montelukast  (SINGULAIR ) 10 MG tablet TAKE 1 TABLET BY MOUTH EVERYDAY AT BEDTIME 90 tablet 3   ondansetron  (ZOFRAN ) 4 MG tablet Take 1 tablet (4 mg total) by mouth daily as needed for nausea or vomiting. 30 tablet 1   ONETOUCH VERIO test strip CHECK BLOOD SUGAR 3 TIMES A DAY 300 strip 3   OXYGEN  Inhale 2 L into the lungs at bedtime.     pantoprazole  (PROTONIX ) 40 MG tablet TAKE 1 TABLET (40 MG TOTAL) BY MOUTH TWICE A DAY BEFORE MEALS 180 tablet 4   potassium chloride  (MICRO-K ) 10 MEQ CR capsule Take 1 capsule (10 mEq total) by mouth daily. 110 capsule 1   simvastatin  (ZOCOR ) 20 MG tablet TAKE 1 TABLET BY MOUTH EVERY DAY IN THE EVENING 90 tablet 1   triamcinolone  cream (KENALOG ) 0.1 % Apply 1 Application topically 2 (two) times daily. For no more than 10 days at a time 45 g 0   vitamin B-12 (CYANOCOBALAMIN ) 1000 MCG tablet Take 1 tablet (1,000 mcg total) by mouth every Monday, Wednesday, and Friday.     warfarin (COUMADIN ) 5 MG tablet TAKE 1 TABLET BY MOUTH DAILY OR AS DIRECTED BY ANTICOAGULATION CLINIC 105 tablet 1   metFORMIN  (GLUCOPHAGE ) 500 MG tablet Take 1 tablet (500 mg total) by mouth daily with breakfast.     methocarbamol  (ROBAXIN ) 500 MG tablet Take 1 tablet (500 mg  total) by mouth 3 (three) times daily as needed for muscle spasms (sedation precautions). 30 tablet 0   metFORMIN  (GLUCOPHAGE ) 500 MG tablet Take 1 tablet (500 mg total) by mouth 2 (two) times daily with a meal.     No facility-administered medications prior to visit.     Per HPI unless specifically indicated in ROS section below Review of Systems  Objective:  BP (!) 152/70   Pulse 68   Temp 98.2 F (36.8 C) (Oral)   Ht 5' 11 (1.803 m)   Wt 261 lb 6 oz (118.6 kg)   SpO2 (!) 89% Comment:  Started pt on 2 L O2 in office  BMI 36.45 kg/m   Wt Readings from Last 3 Encounters:  05/31/23 261 lb 6 oz (118.6 kg)  02/18/23 250 lb 2 oz (113.5 kg)  02/11/23 245 lb (111.1 kg)      Physical Exam Vitals and nursing note reviewed.  Constitutional:      Appearance: Normal appearance. He is not ill-appearing.  HENT:     Mouth/Throat:     Mouth: Mucous membranes are moist.     Pharynx: Oropharynx is clear. No oropharyngeal exudate or posterior oropharyngeal erythema.  Eyes:     Extraocular Movements: Extraocular movements intact.     Pupils: Pupils are equal, round, and reactive to light.  Cardiovascular:     Rate and Rhythm: Normal rate. Rhythm irregular.     Pulses: Normal pulses.     Heart sounds: Normal heart sounds. No murmur heard. Pulmonary:     Effort: Pulmonary effort is normal. No respiratory distress.     Breath sounds: No wheezing, rhonchi or rales.     Comments: Bibasilar coarse crackles Musculoskeletal:     Right lower leg: Edema (tr) present.     Left lower leg: Edema (tr) present.  Skin:    General: Skin is warm and dry.     Findings: Erythema and rash present.     Comments: Erythematous macular rash to bilateral lower legs   Neurological:     Mental Status: He is alert.  Psychiatric:        Mood and Affect: Mood normal.        Behavior: Behavior normal.       Results for orders placed or performed in visit on 05/31/23  Hemoglobin A1c   Collection Time:  05/31/23 12:54 PM  Result Value Ref Range   Hgb A1c MFr Bld 7.9 (H) 4.6 - 6.5 %  CBC with Differential/Platelet   Collection Time: 05/31/23 12:54 PM  Result Value Ref Range   WBC 9.6 4.0 - 10.5 K/uL   RBC 3.99 (L) 4.22 - 5.81 Mil/uL   Hemoglobin 8.6 (L) 13.0 - 17.0 g/dL   HCT 71.0 (L) 60.9 - 47.9 %   MCV 72.5 (L) 78.0 - 100.0 fl   MCHC 29.7 (L) 30.0 - 36.0 g/dL   RDW 81.7 (H) 88.4 - 84.4 %   Platelets 251.0 150.0 - 400.0 K/uL   Neutrophils Relative % 78.8 (H) 43.0 - 77.0 %   Lymphocytes Relative 10.8 (L) 12.0 - 46.0 %   Monocytes Relative 8.4 3.0 - 12.0 %   Eosinophils Relative 1.2 0.0 - 5.0 %   Basophils Relative 0.8 0.0 - 3.0 %   Neutro Abs 7.6 1.4 - 7.7 K/uL   Lymphs Abs 1.0 0.7 - 4.0 K/uL   Monocytes Absolute 0.8 0.1 - 1.0 K/uL   Eosinophils Absolute 0.1 0.0 - 0.7 K/uL   Basophils Absolute 0.1 0.0 - 0.1 K/uL  Basic metabolic panel   Collection Time: 05/31/23 12:54 PM  Result Value Ref Range   Sodium 143 135 - 145 mEq/L   Potassium 4.2 3.5 - 5.1 mEq/L   Chloride 100 96 - 112 mEq/L   CO2 36 (H) 19 - 32 mEq/L   Glucose, Bld 119 (H) 70 - 99 mg/dL   BUN 13 6 - 23 mg/dL   Creatinine, Ser 8.58 0.40 - 1.50 mg/dL   GFR 48.50 (L) >39.99 mL/min   Calcium 9.0 8.4 - 10.5 mg/dL  Brain natriuretic peptide   Collection Time: 05/31/23 12:54  PM  Result Value Ref Range   Pro B Natriuretic peptide (BNP) 134.0 (H) 0.0 - 100.0 pg/mL   *Note: Due to a large number of results and/or encounters for the requested time period, some results have not been displayed. A complete set of results can be found in Results Review.   Lab Results  Component Value Date   NA 143 05/31/2023   CL 100 05/31/2023   K 4.2 05/31/2023   CO2 36 (H) 05/31/2023   BUN 13 05/31/2023   CREATININE 1.41 05/31/2023   GFRNONAA 55 (L) 05/18/2023   CALCIUM 9.0 05/31/2023   PHOS 3.0 08/04/2021   ALBUMIN 4.0 05/18/2023   GLUCOSE 119 (H) 05/31/2023    Lab Results  Component Value Date   ALT 16 05/18/2023   AST 17  05/18/2023   ALKPHOS 80 05/18/2023   BILITOT 0.5 05/18/2023    Lab Results  Component Value Date   WBC 9.6 05/31/2023   HGB 8.6 (L) 05/31/2023   HCT 28.9 (L) 05/31/2023   MCV 72.5 (L) 05/31/2023   PLT 251.0 05/31/2023   Assessment & Plan:   Problem List Items Addressed This Visit     Type 2 diabetes mellitus with other specified complication (HCC)   Chronic, historically good control on metformin , amaryl , farxiga .  Update A1c.  Consider GLP1RA - has previously declined injectable therapy.       Relevant Medications   metFORMIN  (GLUCOPHAGE ) 500 MG tablet   Other Relevant Orders   Hemoglobin A1c (Completed)   Chronic diastolic heart failure (HCC)   He thinks he's taking lasix  80mg  once daily, over the past 5 days has increased to BID dosing.  Anticipate current acute on chronic HF with recovered EF exacerbation as evidenced by increased weight, dyspnea. Recommend continue lasix  80mg  BID for now, to take potassium pill with each lasix . Update labwork today including Cr, BNP.       Relevant Orders   CBC with Differential/Platelet (Completed)   Basic metabolic panel (Completed)   Brain natriuretic peptide (Completed)   Pedal edema - Primary   Recent worsening, this is better over the past few days since increasing lasix .  Anticipate CHF related Continue to recommend salt restriction, leg elevation.  He does have what appears to be a stasis dermatitis rash to BLE.       Relevant Orders   CBC with Differential/Platelet (Completed)   Basic metabolic panel (Completed)   Brain natriuretic peptide (Completed)   Atrial fibrillation (HCC)   Zio patch 12/2022 - data only available for 3 days, but no afib noted.  I asked patient to discuss need for coumadin  with [facility] cardiology.       Chronic respiratory failure with hypoxia and hypercapnia (HCC)   Hypoxic to 89% on RA on presentation, this improves with supplemental oxygen . He has supplemental oxygen  at home available  -encouraged he use over next few days as needed.       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  Just received iron  infusion this past week, with planned 2nd dose later this week.        Meds ordered this encounter  Medications   methocarbamol  (ROBAXIN ) 500 MG tablet    Sig: Take 1 tablet (500 mg total) by mouth 2 (two) times daily as needed for muscle spasms (sedation precautions).    Dispense:  30 tablet    Refill:  0    Orders Placed This  Encounter  Procedures   Hemoglobin A1c   CBC with Differential/Platelet   Basic metabolic panel   Brain natriuretic peptide    Patient Instructions  Labs today.  Elevate legs. Continue limiting salt/sodium.  Continue lasix  80mg  twice daily, with  metolazone  2.5mg  once weekly. Take a potassium pill every time you take a lasix  pill.   Follow up plan: Return in about 6 weeks (around 07/12/2023).  Anton Blas, MD

## 2023-05-31 NOTE — Patient Instructions (Addendum)
Labs today.  Elevate legs. Continue limiting salt/sodium.  Continue lasix 80mg  twice daily, with  metolazone 2.5mg  once weekly. Take a potassium pill every time you take a lasix pill.

## 2023-06-01 ENCOUNTER — Telehealth: Payer: Self-pay | Admitting: *Deleted

## 2023-06-01 NOTE — Assessment & Plan Note (Addendum)
 Recent worsening, this is better over the past few days since increasing lasix .  Anticipate CHF related Continue to recommend salt restriction, leg elevation.  He does have what appears to be a stasis dermatitis rash to BLE.

## 2023-06-01 NOTE — Assessment & Plan Note (Signed)
 Hypoxic to 89% on RA on presentation, this improves with supplemental oxygen . He has supplemental oxygen  at home available -encouraged he use over next few days as needed.

## 2023-06-01 NOTE — Telephone Encounter (Signed)
 Copied from CRM [id number]. Topic: Clinical - Medical Advice >> Jun 01, 2023 12:45 PM Evie B wrote: Reason for CRM:  pt called to speak with provider regarding medical advise. Pt called to ask the provider to submit a prescription for muscle relaxer,  Pt is also requesting Brestri tree in [place] please call pt back at [phone]

## 2023-06-01 NOTE — Assessment & Plan Note (Signed)
 Zio patch 12/2022 - data only available for 3 days, but no afib noted.  I asked patient to discuss need for coumadin  with [facility] cardiology.

## 2023-06-01 NOTE — Assessment & Plan Note (Addendum)
 Chronic, historically good control on metformin , amaryl , farxiga .  Update A1c.  Consider GLP1RA - has previously declined injectable therapy.

## 2023-06-01 NOTE — Assessment & Plan Note (Signed)
 He thinks he's taking lasix  80mg  once daily, over the past 5 days has increased to BID dosing.  Anticipate current acute on chronic HF with recovered EF exacerbation as evidenced by increased weight, dyspnea. Recommend continue lasix  80mg  BID for now, to take potassium pill with each lasix . Update labwork today including Cr, BNP.

## 2023-06-01 NOTE — Assessment & Plan Note (Signed)
 Appreciate heme care.  Just received iron  infusion this past week, with planned 2nd dose later this week.

## 2023-06-02 ENCOUNTER — Inpatient Hospital Stay: Payer: Medicare Other | Attending: Oncology

## 2023-06-02 VITALS — BP 129/61 | HR 62 | Temp 96.7°F | Resp 16

## 2023-06-02 DIAGNOSIS — D508 Other iron deficiency anemias: Secondary | ICD-10-CM

## 2023-06-02 DIAGNOSIS — D509 Iron deficiency anemia, unspecified: Secondary | ICD-10-CM | POA: Insufficient documentation

## 2023-06-02 MED ORDER — SODIUM CHLORIDE 0.9% FLUSH
10.0000 mL | Freq: Once | INTRAVENOUS | Status: DC | PRN
  Filled 2023-06-02: qty 10

## 2023-06-02 MED ORDER — SODIUM CHLORIDE 0.9 % IV SOLN
INTRAVENOUS | Status: DC
  Filled 2023-06-02: qty 250

## 2023-06-02 MED ORDER — SODIUM CHLORIDE 0.9 % IV SOLN
510.0000 mg | Freq: Once | INTRAVENOUS | Status: AC
  Administered 2023-06-02: 510 mg via INTRAVENOUS
  Filled 2023-06-02: qty 510

## 2023-06-02 NOTE — Patient Instructions (Signed)

## 2023-06-02 NOTE — Progress Notes (Signed)
Refused post observation. Aware of risks. Vitals stable at discharge.

## 2023-06-03 ENCOUNTER — Encounter: Payer: Self-pay | Admitting: Pharmacist

## 2023-06-03 ENCOUNTER — Other Ambulatory Visit: Payer: Self-pay | Admitting: Family Medicine

## 2023-06-03 DIAGNOSIS — E1169 Type 2 diabetes mellitus with other specified complication: Secondary | ICD-10-CM

## 2023-06-03 MED ORDER — BREZTRI AEROSPHERE 160-9-4.8 MCG/ACT IN AERO: 2.0000 | INHALATION_SPRAY | Freq: Two times a day (BID) | RESPIRATORY_TRACT | 11 refills | Status: DC

## 2023-06-03 NOTE — Telephone Encounter (Signed)
Lvm asking pt to call back.  Need to relay Dr. G's message.  

## 2023-06-03 NOTE — Progress Notes (Unsigned)
Placed in provider's box for review.

## 2023-06-03 NOTE — Telephone Encounter (Addendum)
 Robaxin  muscle relaxant was sent in 06/01/2023 to local [facility] We are working on his patient assistance for breztri  and farxiga . I've sent in Breztri  Rx to local pharmacy.
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
## 2023-06-03 NOTE — Addendum Note (Signed)
 Addended by: Claire Crick on: 06/03/2023 07:23 AM   Modules accepted: Orders

## 2023-06-06 ENCOUNTER — Telehealth: Payer: Self-pay

## 2023-06-06 NOTE — Progress Notes (Unsigned)
 Next Steps: [x]    Application filled out and uploaded to eFax folder for Patient signature in front office [x]    Once patient signs in front office, place full application in PCP folder for PCP signature [x]    Once PCP signs, Application to be faxed to [employer] Fax: [phone] with copy of patient's insurance card AND scanned into patient chart []    Once Program approval/denial received via fax - documented in chart

## 2023-06-06 NOTE — Telephone Encounter (Signed)
 This question was from a phone conversation with the patient today.

## 2023-06-06 NOTE — Telephone Encounter (Signed)
 PAP: Patient application pending ON PROVIDER PAGES THAT HAVE BEEN SENT TO PROVIDER OFFICE FOR BREZTRI  AND FARXIGA  ([employer]) TO BE SIGNED.

## 2023-06-06 NOTE — Progress Notes (Signed)
Pharmacy Medication Assistance Program Note    06/06/2023  Patient ID: [name], male   DOB: 1955-07-17, 68 y.o.   MRN: [id number]     06/06/2023  Outreach Medication One  Initial Outreach Date (Medication One) 06/03/2023  Manufacturer Medication One Nurse, adult Drugs Bretztri  Dose of Breztri 160-9-4.8 MC  Dose of Farxiga 10MG   Type of Radiographer, therapeutic Assistance  Date Application Sent to Prescriber 06/03/2023  Name of Prescriber Genella Rife MD     Signature

## 2023-06-06 NOTE — Telephone Encounter (Signed)
 Called patient reviewed all information and repeated back to me. Will call if any questions. He did want to know where we were with the patient assistance. Advised I will send to pharmacy to follow up

## 2023-06-08 NOTE — Telephone Encounter (Signed)
PAP: Patient assistance application for Markus Daft and Marcelline Deist has been approved by PAP Companies: [employer] from 06/06/2023 to 04/25/2024. Medication should be delivered to PAP Delivery: Home. For further shipping updates, please contact [employer] ([employer]) at [phone]. Patient ID is: NO ID  PLEASE BE ADVISED

## 2023-06-09 ENCOUNTER — Ambulatory Visit: Payer: Medicare Other

## 2023-06-09 DIAGNOSIS — Z7901 Long term (current) use of anticoagulants: Secondary | ICD-10-CM

## 2023-06-09 DIAGNOSIS — I480 Paroxysmal atrial fibrillation: Secondary | ICD-10-CM | POA: Diagnosis not present

## 2023-06-09 LAB — POCT INR: INR: 1.7 — AB (ref 2.0–3.0)

## 2023-06-09 NOTE — Patient Instructions (Addendum)
Pre visit review using our clinic review tool, if applicable. No additional management support is needed unless otherwise documented below in the visit note.  Increase dose today to take 1 1/2 tablets and then continue 1/2 tablet daily except take 1 tablet on Sunday, Tuesday and Thursday. Recheck in 2 weeks.

## 2023-06-09 NOTE — Progress Notes (Addendum)
Pt denies any changes and does not know why he is subtherapeutic today. Increase dose today to take 1 1/2 tablets and then continue 1/2 tablet daily except take 1 tablet on Sunday, Tuesday and Thursday. Recheck in 2 weeks.    Pt called to report he has not been taking warfarin as doses at last apt. He has been taking 1 tablet daily except take 1/2 tablet on Monday, Wednesday and Saturday. Updated calendar and advised pt to continue the dosing he has been taking until recheck in 2 weeks. Pt verbalized understanding.

## 2023-06-23 ENCOUNTER — Ambulatory Visit (INDEPENDENT_AMBULATORY_CARE_PROVIDER_SITE_OTHER): Payer: Medicare Other

## 2023-06-23 DIAGNOSIS — Z7901 Long term (current) use of anticoagulants: Secondary | ICD-10-CM

## 2023-06-23 LAB — POCT INR: INR: 1.5 — AB (ref 2.0–3.0)

## 2023-06-23 NOTE — Patient Instructions (Addendum)
 Pre visit review using our clinic review tool, if applicable. No additional management support is needed unless otherwise documented below in the visit note.  Increase dose today to take 1 1/2 tablets and increase dose tomorrow to take 1 1/2 tablets and then change weekly dose to take 1 tablet daily except take 1/2 tablet on Monday and Thursday.  Recheck in 1 weeks.

## 2023-06-23 NOTE — Progress Notes (Signed)
 Increase dose today to take 1 1/2 tablets and increase dose tomorrow to take 1 1/2 tablets and then change weekly dose to take 1 tablet daily except take 1/2 tablet on Monday and Thursday.  Recheck in 1 weeks.

## 2023-06-30 ENCOUNTER — Ambulatory Visit: Payer: Medicare Other

## 2023-06-30 DIAGNOSIS — Z7901 Long term (current) use of anticoagulants: Secondary | ICD-10-CM

## 2023-06-30 LAB — POCT INR: INR: 2.3 (ref 2.0–3.0)

## 2023-06-30 NOTE — Patient Instructions (Addendum)
 Pre visit review using our clinic review tool, if applicable. No additional management support is needed unless otherwise documented below in the visit note.  Continue 1 tablet daily except take 1/2 tablet on Monday, Wednesday and Saturday.  Recheck in 3 weeks.

## 2023-06-30 NOTE — Progress Notes (Signed)
 Pt reports he went back to prior dosing because he thought he was supposed to keep taking 1/2 tablet on Saturdays. Pt is in range today so calendar was updated to the prior dosing. Continue 1 tablet daily except take 1/2 tablet on Monday, Wednesday and Saturday.  Recheck in 3 weeks.

## 2023-07-06 ENCOUNTER — Other Ambulatory Visit: Payer: Self-pay | Admitting: Family Medicine

## 2023-07-06 DIAGNOSIS — J432 Centrilobular emphysema: Secondary | ICD-10-CM

## 2023-07-06 DIAGNOSIS — E1169 Type 2 diabetes mellitus with other specified complication: Secondary | ICD-10-CM

## 2023-07-06 NOTE — Telephone Encounter (Signed)
 E-scribed refills.  Pt has MCR wellness on 09/14/23.   Plz schedule CPE and fasting labs (no food/drink- except water and/or blk coffee 5 hrs prior) after 09/14/23 for additional refills.

## 2023-07-06 NOTE — Telephone Encounter (Signed)
 Patient has been scheduled

## 2023-07-06 NOTE — Telephone Encounter (Signed)
LVM for patient to cb and schedule appt.  

## 2023-07-17 ENCOUNTER — Other Ambulatory Visit: Payer: Self-pay | Admitting: Family Medicine

## 2023-07-17 DIAGNOSIS — M5030 Other cervical disc degeneration, unspecified cervical region: Secondary | ICD-10-CM

## 2023-07-17 DIAGNOSIS — G894 Chronic pain syndrome: Secondary | ICD-10-CM

## 2023-07-19 NOTE — Telephone Encounter (Signed)
 Robaxin Last filled:  05/31/23, #30 Last OV:  05/31/23, pedal edema Next OV:  10/04/23, CPE

## 2023-07-21 ENCOUNTER — Ambulatory Visit (INDEPENDENT_AMBULATORY_CARE_PROVIDER_SITE_OTHER)

## 2023-07-21 DIAGNOSIS — Z7901 Long term (current) use of anticoagulants: Secondary | ICD-10-CM

## 2023-07-21 LAB — POCT INR: INR: 1.6 — AB (ref 2.0–3.0)

## 2023-07-21 NOTE — Progress Notes (Signed)
 Pt denies any changes. Increase dose today and tomorrow to take 1 1/2 tablets and then continue 1 tablet daily except take 1/2 tablet on Monday, Wednesday and Saturday.  Recheck in 2 weeks.

## 2023-07-21 NOTE — Patient Instructions (Addendum)
 Pre visit review using our clinic review tool, if applicable. No additional management support is needed unless otherwise documented below in the visit note.  Increase dose today and tomorrow to take 1 1/2 tablets and then continue 1 tablet daily except take 1/2 tablet on Monday, Wednesday and Saturday.  Recheck in 2 weeks.

## 2023-08-04 ENCOUNTER — Ambulatory Visit

## 2023-08-04 DIAGNOSIS — Z7901 Long term (current) use of anticoagulants: Secondary | ICD-10-CM

## 2023-08-04 LAB — POCT INR: INR: 2 (ref 2.0–3.0)

## 2023-08-04 NOTE — Progress Notes (Signed)
 Pt reports he missed a couple of doses but then tried to make it up by adjusting later doses to cover for the missed doses. Advised importance of adhering to dosing schedule. Pt verbalized understanding.  Continue 1 tablet daily except take 1/2 tablet on Monday, Wednesday and Saturday.  Recheck in 4 weeks.

## 2023-08-04 NOTE — Patient Instructions (Addendum)
 Pre visit review using our clinic review tool, if applicable. No additional management support is needed unless otherwise documented below in the visit note.  Continue 1 tablet daily except take 1/2 tablet on Monday, Wednesday and Saturday.  Recheck in 4 weeks.

## 2023-08-12 ENCOUNTER — Other Ambulatory Visit: Payer: Self-pay | Admitting: Family Medicine

## 2023-08-12 ENCOUNTER — Encounter: Payer: Self-pay | Admitting: Oncology

## 2023-08-12 ENCOUNTER — Inpatient Hospital Stay (HOSPITAL_BASED_OUTPATIENT_CLINIC_OR_DEPARTMENT_OTHER): Payer: Medicare Other | Admitting: Oncology

## 2023-08-12 ENCOUNTER — Inpatient Hospital Stay: Payer: Medicare Other | Attending: Oncology

## 2023-08-12 VITALS — BP 141/87 | HR 55 | Temp 98.4°F | Resp 19 | Ht 71.0 in | Wt 246.0 lb

## 2023-08-12 DIAGNOSIS — D509 Iron deficiency anemia, unspecified: Secondary | ICD-10-CM | POA: Insufficient documentation

## 2023-08-12 DIAGNOSIS — D508 Other iron deficiency anemias: Secondary | ICD-10-CM | POA: Diagnosis not present

## 2023-08-12 DIAGNOSIS — R768 Other specified abnormal immunological findings in serum: Secondary | ICD-10-CM | POA: Diagnosis not present

## 2023-08-12 DIAGNOSIS — Z79899 Other long term (current) drug therapy: Secondary | ICD-10-CM | POA: Insufficient documentation

## 2023-08-12 LAB — IRON AND TIBC
Iron: 37 ug/dL — ABNORMAL LOW (ref 45–182)
Saturation Ratios: 9 % — ABNORMAL LOW (ref 17.9–39.5)
TIBC: 416 ug/dL (ref 250–450)
UIBC: 379 ug/dL

## 2023-08-12 LAB — CBC (CANCER CENTER ONLY)
HCT: 37.2 % — ABNORMAL LOW (ref 39.0–52.0)
Hemoglobin: 10.9 g/dL — ABNORMAL LOW (ref 13.0–17.0)
MCH: 24.1 pg — ABNORMAL LOW (ref 26.0–34.0)
MCHC: 29.3 g/dL — ABNORMAL LOW (ref 30.0–36.0)
MCV: 82.1 fL (ref 80.0–100.0)
Platelet Count: 240 10*3/uL (ref 150–400)
RBC: 4.53 MIL/uL (ref 4.22–5.81)
RDW: 19.4 % — ABNORMAL HIGH (ref 11.5–15.5)
WBC Count: 8.9 10*3/uL (ref 4.0–10.5)
nRBC: 0 % (ref 0.0–0.2)

## 2023-08-12 LAB — FERRITIN: Ferritin: 9 ng/mL — ABNORMAL LOW (ref 24–336)

## 2023-08-13 ENCOUNTER — Other Ambulatory Visit: Payer: Self-pay | Admitting: Family Medicine

## 2023-08-13 DIAGNOSIS — M5030 Other cervical disc degeneration, unspecified cervical region: Secondary | ICD-10-CM

## 2023-08-13 DIAGNOSIS — G894 Chronic pain syndrome: Secondary | ICD-10-CM

## 2023-08-14 ENCOUNTER — Encounter: Payer: Self-pay | Admitting: Oncology

## 2023-08-14 NOTE — Progress Notes (Signed)
 Hematology/Oncology Consult note [facility]  [phone] [phone]  Patient Care Team: Claire Crick, MD as PCP - General (Family Medicine) Boyce Byes, MD as PCP - Electrophysiology (Cardiology) Jolly Needle, MD (Inactive) as Attending Physician (Cardiology) Michelle Aid, MD as Referring Physician (Internal Medicine) Jonathan Neighbor, [facility] (Inactive) as Pharmacist (Pharmacist) Avonne Boettcher, MD as Consulting Physician (Oncology) Avonne Boettcher, MD as Consulting Physician (Oncology) Green, Davina E, RN as [facility] Care Management   Name of the patient: [name]  [id number]  1955/12/05   Date of visit: 08/14/23  Diagnosis-iron  deficiency anemia  Chief complaint/ Reason for visit-routine follow-up of iron  deficiency anemia  Heme/Onc history: Patient is a 68 year old male with a past medical history significant for coronary artery disease, fatty liver, COPD among other medical problems who has been referred to us  for iron  deficiency anemia.  His most recent CBC from 07/15/2021 showed H&H of 10.3/35.2 with an MCV of 69 with a normal white count and platelet count.  Hemoglobin had drifted down from 14 a year ago to 12.74 months ago to 10.3 presently.  Iron  studies showed a low iron  saturation of 3.2% and elevated TIBC with a ferritin level of 6.4.  B12 levels were elevated at 1500.  TSH normal at 2.8.  Patient underwent EGD and colonoscopy by Dr. Ole Berkeley in 2022.  EGD showed no abnormality colonoscopy showed 4 mm ulcer in the descending colon which was biopsied and was negative for malignancy.  Capsule study at that time was inconclusive as the capsule did not pass through into his small bowel.  Patient had a repeat EGD and colonoscopy in January 2024 as well which was again unremarkable with no evidence of bleeding.   Interval history- reports ongoing fatigue. Denies any blood loss in stool or urine  ECOG  PS- 1 Pain scale- 0   Review of systems- Review of Systems  Constitutional:  Negative for chills, fever, malaise/fatigue and weight loss.  HENT:  Negative for congestion, ear discharge and nosebleeds.   Eyes:  Negative for blurred vision.  Respiratory:  Negative for cough, hemoptysis, sputum production, shortness of breath and wheezing.   Cardiovascular:  Negative for chest pain, palpitations, orthopnea and claudication.  Gastrointestinal:  Negative for abdominal pain, blood in stool, constipation, diarrhea, heartburn, melena, nausea and vomiting.  Genitourinary:  Negative for dysuria, flank pain, frequency, hematuria and urgency.  Musculoskeletal:  Negative for back pain, joint pain and myalgias.  Skin:  Negative for rash.  Neurological:  Negative for dizziness, tingling, focal weakness, seizures, weakness and headaches.  Endo/Heme/Allergies:  Does not bruise/bleed easily.  Psychiatric/Behavioral:  Negative for depression and suicidal ideas. The patient does not have insomnia.       Allergies  Allergen Reactions   Atorvastatin Other (See Comments)    Dizziness   Lisinopril Cough     Past Medical History:  Diagnosis Date   Adrenal nodule (HCC) 12/28/2021   a.) CT abd/pel 12/28/2021: LEFT adrenal nodule measuring 1.6 cm   Anemia    Angina    Anxiety    Aortic atherosclerosis (HCC)    Arthritis    Atrial fibrillation and flutter (HCC) 2013   a.) CHA2DS2VASc = 5 (age, CHF, HTN, vascular disease history, T2DM);  b.) s/p DCCV (100 J) 10/27/2010; c.) s/p ablation 09/21/2011; d.) rate/rhythm maintained on oral amiodarone  + carvedilol ; chronically anticoagulated with warfarin   CAD (coronary artery disease) 07/15/2010   a.) LHC 07/15/2010: 30%  pLAD, 40% D1, 25%/30% pLCx, 30% mLCx - med mgmt   CHF (congestive heart failure) - NYHA Class II/III 2013   a.) TTE 02/26/2013: EF 50%, mild MR/TR; b.) EST 06/13/2017: EF 50%, mild LCH, triv MR/TR; c.) TTTE 03/24/2020: EF >55%, mild LVH, mild  MAC, mild BAE, triv PR, mild MR/TR, G1DD; d.) TTE 11/19/2021: EF 60-65%, mod reduced RVSF, mild LAE, mod MAC, Ao root 37 mm, G2DD   COPD (chronic obstructive pulmonary disease) (HCC)    Diabetes type 2, uncontrolled    declines DSME   Dilated cardiomyopathy (HCC) 2013   a.) EF 25% at time of Dx in 2013; b.) TTE 02/26/2013: EF 50%; c.) EST 06/13/2017: EF 50%;  d.) TTE 03/24/2020: EF >55%; e.) TTE 11/19/2021: EF 60-65%   Ex-smoker    GERD (gastroesophageal reflux disease)    Hepatic steatosis    Hyperlipidemia    Hypertension    Infrarenal abdominal aortic aneurysm (AAA) without rupture (HCC) 08/11/2016   a.) CT abd/pel 08/11/2016: 3.3 cm; b.) CT abd/pel 12/22/2018: 3.3 cm; c.) CT abd/pel 08/31/2021: 3.7 cm; d.) CT abd/pel 12/28/2021: 3.9 cm   Long term current use of amiodarone  2023   a.) previously on dranaderone (cost prohibitive); switched to amiodarone  2023   Long term current use of anticoagulant    a.) warfarin   Marijuana use    a.) UDS (+) of THC x 3 (02/06/2013, 06/29/2013, 09/03/2013); PCP advised "one more and we will stop prescribing ativan  (08/2013)"   Migraine    Narcolepsy    Obesity    OSA (obstructive sleep apnea)    a.) unable to tolerate nocturnal PAP therapy; uses supplemetal oxygen  at 2L/[place] during sleep   Seasonal allergies    Umbilical hernia      Past Surgical History:  Procedure Laterality Date   ATRIAL FLUTTER ABLATION N/A 09/21/2011   Procedure: ATRIAL FLUTTER ABLATION;  Surgeon: Jolly Needle, MD;  Location: [facility] CATH LAB;  Service: Cardiovascular;  Laterality: N/A;   CARDIAC ELECTROPHYSIOLOGY MAPPING AND ABLATION  2013   for atrial flutter   CARDIOVASCULAR STRESS TEST  03/2012   ETT WNL Bary Likes)   CARDIOVERSION N/A 08/12/2021   Procedure: CARDIOVERSION;  Surgeon: Michelle Aid, MD;  Location: [facility] ORS;  Service: Cardiovascular;  Laterality: N/A;   COLONOSCOPY WITH PROPOFOL  N/A 05/22/2020   TA, diverticulosis, descending colon ulcer biopsy WNL, int  hem Ole Berkeley, Darren, MD)   COLONOSCOPY WITH PROPOFOL  N/A 04/28/2022   multiple TAs - 10 small, 2 1.2cm, 2 9mm, rpt 3 yrs (Vanga, Elson Halon, MD)   CYST EXCISION N/A 08/05/2022   excision of back cysts x 2 (pathology returned epidermal inclusion cysts);  Surgeon: Emmalene Hare, MD   ESOPHAGOGASTRODUODENOSCOPY (EGD) WITH PROPOFOL  N/A 05/22/2020   WNL (Wohl)   ESOPHAGOGASTRODUODENOSCOPY (EGD) WITH PROPOFOL  N/A 04/28/2022   12mm duodenal TA, reactive gastropathy, enteric mucosa in stomach biopsy of unclear significance (Vanga, Elson Halon, MD)   GIVENS CAPSULE STUDY  04/28/2022   Procedure: GIVENS CAPSULE STUDY;  Surgeon: Selena Daily, MD;  Location: [facility] ENDOSCOPY;  Service: Endoscopy;;   US  ECHOCARDIOGRAPHY  2014   EF 50%, nl LV fxn, RV nl size/function, mild mitral insuff    Social History   Socioeconomic History   Marital status: Single    Spouse name: Not on file   Number of children: 0   Years of education: Not on file   Highest education level: 12th grade  Occupational History   Occupation: disable   Tobacco Use  Smoking status: Former    Current packs/day: 0.00    Average packs/day: 1 pack/day for 31.0 years (31.0 ttl pk-yrs)    Types: Cigarettes    Start date: 04/27/1987    Quit date: 04/26/2018    Years since quitting: 5.3    Passive exposure: Past   Smokeless tobacco: Never  Vaping Use   Vaping status: Never Used  Substance and Sexual Activity   Alcohol use: No    Alcohol/week: 0.0 standard drinks of alcohol   Drug use: Yes    Types: Marijuana   Sexual activity: Not Currently  Other Topics Concern   Not on file  Social History Narrative   Pt lives in [place]   Divorced   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   Disability due to neck arthritis, anxiety and sleep apnea   Edu: HS   Activity: no regular exercise, mows lawn (riding and push)   Diet: good water, fruits/vegetables daily   Social Drivers of Health   Financial Resource Strain: Low Risk   (05/19/2023)   Overall Financial Resource Strain (CARDIA)    Difficulty of Paying Living Expenses: Not hard at all  Food Insecurity: Food Insecurity Present (05/19/2023)   Hunger Vital Sign    Worried About Running Out of Food in the Last Year: Never true    Ran Out of Food in the Last Year: Sometimes true  Transportation Needs: No Transportation Needs (05/19/2023)   PRAPARE - Administrator, Civil Service (Medical): No    Lack of Transportation (Non-Medical): No  Physical Activity: Unknown (05/19/2023)   Exercise Vital Sign    Days of Exercise per Week: 0 days    Minutes of Exercise per Session: Not on file  Stress: No Stress Concern Present (05/19/2023)   [employer] of Occupational Health - Occupational Stress Questionnaire    Feeling of Stress : Only a little  Social Connections: Socially Isolated (05/19/2023)   Social Connection and Isolation Panel [NHANES]    Frequency of Communication with Friends and Family: More than three times a week    Frequency of Social Gatherings with Friends and Family: Three times a week    Attends Religious Services: Never    Active Member of Clubs or Organizations: No    Attends Banker Meetings: Not on file    Marital Status: Divorced  Intimate Partner Violence: Not At Risk (07/09/2020)   Humiliation, Afraid, Rape, and Kick questionnaire    Fear of Current or Ex-Partner: No    Emotionally Abused: No    Physically Abused: No    Sexually Abused: No    Family History  Problem Relation Age of Onset   Heart failure Father 86   Cancer Mother 52       NHL   Hypertension Maternal Grandfather    CAD Maternal Grandfather 81       MI   Diabetes Other        grandparents   Cancer Brother 66       lung (smoker)     Current Outpatient Medications:    ACCU-CHEK FASTCLIX LANCETS MISC, Check blood sugar once daily and as instructed. Dx 250.00, Disp: 100 each, Rfl: 3   acetaminophen  (TYLENOL ) 500 MG tablet, Take 2 tablets  (1,000 mg total) by mouth every 6 (six) hours as needed for mild pain., Disp: , Rfl:    albuterol  (VENTOLIN  HFA) 108 (90 Base) MCG/ACT inhaler, Inhale 2 puffs into the lungs every 6 (six) hours as needed for wheezing  or shortness of breath., Disp: 8.5 each, Rfl: 6   amiodarone  (PACERONE ) 200 MG tablet, Take 1 tablet (200 mg total) by mouth daily., Disp: 90 tablet, Rfl: 3   Blood Glucose Monitoring Suppl (BLOOD GLUCOSE MONITOR SYSTEM) W/DEVICE KIT, by Does not apply route. Use to check sugar once daily and as needed Dx: E11.9 **ONE TOUCH VERIO**, Disp: , Rfl:    Budeson-Glycopyrrol-Formoterol  (BREZTRI  AEROSPHERE) 160-9-4.8 MCG/ACT AERO, Inhale 2 puffs into the lungs 2 (two) times daily., Disp: 32.1 g, Rfl: 11   Cholecalciferol  (VITAMIN D ) 50 MCG (2000 UT) CAPS, Take 1 capsule (2,000 Units total) by mouth daily., Disp: 30 capsule, Rfl:    dapagliflozin  propanediol (FARXIGA ) 10 MG TABS tablet, Take 1 tablet (10 mg total) by mouth daily before breakfast., Disp: 90 tablet, Rfl: 3   Docusate Calcium (STOOL SOFTENER PO), Take 1 tablet by mouth daily., Disp: , Rfl:    FLUoxetine  (PROZAC ) 40 MG capsule, TAKE 1 CAPSULE (40 MG TOTAL) BY MOUTH DAILY., Disp: 90 capsule, Rfl: 3   fluticasone  (FLONASE ) 50 MCG/ACT nasal spray, Place 2 sprays into both nostrils daily. (Patient taking differently: Place 2 sprays into both nostrils as needed.), Disp: 48 mL, Rfl: 3   furosemide  (LASIX ) 80 MG tablet, Take 1 tablet (80 mg total) by mouth daily. With second dose as needed for weight gain, leg swelling, Disp: , Rfl:    glimepiride  (AMARYL ) 4 MG tablet, TAKE 1 TABLET BY MOUTH EVERY DAY WITH BREAKFAST, Disp: 90 tablet, Rfl: 0   ipratropium-albuterol  (DUONEB) 0.5-2.5 (3) MG/3ML SOLN, TAKE 3 MLS BY NEBULIZATION EVERY 6 (SIX) HOURS AS NEEDED (SEVERE SHORTNESS OF BREATH/WHEEZING)., Disp: 360 mL, Rfl: 0   metFORMIN  (GLUCOPHAGE ) 500 MG tablet, TAKE 1 TABLET BY MOUTH TWICE A DAY WITH FOOD, Disp: 180 tablet, Rfl: 0   methocarbamol   (ROBAXIN ) 500 MG tablet, TAKE 1 TABLET (500 MG TOTAL) BY MOUTH 2 (TWO) TIMES DAILY AS NEEDED FOR MUSCLE SPASMS (SEDATION PRECAUTIONS)., Disp: 30 tablet, Rfl: 0   metolazone  (ZAROXOLYN ) 2.5 MG tablet, Take 1 tablet (2.5 mg total) by mouth once a week. As needed, Disp: , Rfl:    montelukast  (SINGULAIR ) 10 MG tablet, TAKE 1 TABLET BY MOUTH EVERYDAY AT BEDTIME, Disp: 90 tablet, Rfl: 0   ONETOUCH VERIO test strip, CHECK BLOOD SUGAR 3 TIMES A DAY, Disp: 300 strip, Rfl: 3   OXYGEN , Inhale 2 L into the lungs at bedtime., Disp: , Rfl:    pantoprazole  (PROTONIX ) 40 MG tablet, TAKE 1 TABLET (40 MG TOTAL) BY MOUTH TWICE A DAY BEFORE MEALS, Disp: 180 tablet, Rfl: 4   potassium chloride  (MICRO-K ) 10 MEQ CR capsule, Take 1 capsule (10 mEq total) by mouth daily., Disp: 110 capsule, Rfl: 1   simvastatin  (ZOCOR ) 20 MG tablet, TAKE 1 TABLET BY MOUTH EVERY DAY IN THE EVENING, Disp: 90 tablet, Rfl: 0   triamcinolone  cream (KENALOG ) 0.1 %, Apply 1 Application topically 2 (two) times daily. For no more than 10 days at a time, Disp: 45 g, Rfl: 0   vitamin B-12 (CYANOCOBALAMIN ) 1000 MCG tablet, Take 1 tablet (1,000 mcg total) by mouth every Monday, Wednesday, and Friday., Disp: , Rfl:    warfarin (COUMADIN ) 5 MG tablet, TAKE 1 TABLET BY MOUTH DAILY OR AS DIRECTED BY ANTICOAGULATION CLINIC, Disp: 105 tablet, Rfl: 1  Physical exam:  Vitals:   08/12/23 1318  BP: (!) 141/87  Pulse: (!) 55  Resp: 19  Temp: 98.4 F (36.9 C)  TempSrc: Tympanic  SpO2: 96%  Weight: 246 lb (  111.6 kg)  Height: 5\' 11"  (1.803 m)   Physical Exam Cardiovascular:     Rate and Rhythm: Normal rate and regular rhythm.     Heart sounds: Normal heart sounds.  Pulmonary:     Effort: Pulmonary effort is normal.     Breath sounds: Normal breath sounds.  Skin:    General: Skin is warm and dry.  Neurological:     Mental Status: He is alert and oriented to person, place, and time.      I have personally reviewed labs listed below:    Latest  Ref Rng & Units 05/31/2023   12:54 PM  CMP  Glucose 70 - 99 mg/dL 782   BUN 6 - 23 mg/dL 13   Creatinine 9.56 - 1.50 mg/dL 2.13   Sodium 086 - 578 mEq/L 143   Potassium 3.5 - 5.1 mEq/L 4.2   Chloride 96 - 112 mEq/L 100   CO2 19 - 32 mEq/L 36   Calcium 8.4 - 10.5 mg/dL 9.0       Latest Ref Rng & Units 08/12/2023   12:49 PM  CBC  WBC 4.0 - 10.5 K/uL 8.9   Hemoglobin 13.0 - 17.0 g/dL 46.9   Hematocrit 62.9 - 52.0 % 37.2   Platelets 150 - 400 K/uL 240       Assessment and plan- Patient is a 68 y.o. male here for routine follow-up of iron  deficiency anemia  Patient's hemoglobin is 10.9 today which is close to his baseline but ferritin levels are low at 9 with an iron  saturation of 9%.  He feels significantly fatigued as well.  We will plan to give him 2 doses of Feraheme  at this time.  CBC ferritin and iron  studies in 3 and 6 months and I will see him back in 6 months   Visit Diagnosis 1. Other iron  deficiency anemia   2. Elevated serum immunoglobulin free light chain level      Dr. Seretha Dance, MD, MPH [facility] at [facility] [phone] 08/14/2023 10:37 AM

## 2023-08-17 NOTE — Telephone Encounter (Signed)
 Robaxin  Last filled:  07/20/23, #30 Last OV:  05/31/23, pedal edema Next OV:  10/04/23, CPE

## 2023-08-19 ENCOUNTER — Inpatient Hospital Stay

## 2023-08-19 ENCOUNTER — Other Ambulatory Visit: Payer: Self-pay | Admitting: *Deleted

## 2023-08-19 VITALS — BP 126/80 | HR 56 | Temp 98.0°F | Resp 16

## 2023-08-19 DIAGNOSIS — D509 Iron deficiency anemia, unspecified: Secondary | ICD-10-CM | POA: Diagnosis not present

## 2023-08-19 DIAGNOSIS — Z79899 Other long term (current) drug therapy: Secondary | ICD-10-CM | POA: Diagnosis not present

## 2023-08-19 DIAGNOSIS — D508 Other iron deficiency anemias: Secondary | ICD-10-CM

## 2023-08-19 MED ORDER — SODIUM CHLORIDE 0.9 % IV SOLN
INTRAVENOUS | Status: DC
  Filled 2023-08-19: qty 250

## 2023-08-19 MED ORDER — SODIUM CHLORIDE 0.9 % IV SOLN
510.0000 mg | INTRAVENOUS | Status: DC
  Administered 2023-08-19: 510 mg via INTRAVENOUS
  Filled 2023-08-19: qty 510

## 2023-08-19 NOTE — Patient Instructions (Signed)

## 2023-08-26 ENCOUNTER — Inpatient Hospital Stay: Attending: Oncology

## 2023-08-26 VITALS — BP 134/74 | HR 58 | Temp 97.4°F

## 2023-08-26 DIAGNOSIS — D508 Other iron deficiency anemias: Secondary | ICD-10-CM

## 2023-08-26 DIAGNOSIS — D509 Iron deficiency anemia, unspecified: Secondary | ICD-10-CM | POA: Diagnosis not present

## 2023-08-26 MED ORDER — SODIUM CHLORIDE 0.9 % IV SOLN
Freq: Once | INTRAVENOUS | Status: AC
  Filled 2023-08-26: qty 250

## 2023-08-26 MED ORDER — SODIUM CHLORIDE 0.9 % IV SOLN
510.0000 mg | INTRAVENOUS | Status: DC
  Administered 2023-08-26: 510 mg via INTRAVENOUS
  Filled 2023-08-26: qty 510

## 2023-09-01 ENCOUNTER — Ambulatory Visit

## 2023-09-01 DIAGNOSIS — Z7901 Long term (current) use of anticoagulants: Secondary | ICD-10-CM

## 2023-09-01 LAB — POCT INR: INR: 2.2 (ref 2.0–3.0)

## 2023-09-01 NOTE — Progress Notes (Signed)
 Continue 1 tablet daily except take 1/2 tablet on Monday, Wednesday and Saturday.  Recheck in 4 weeks.

## 2023-09-01 NOTE — Patient Instructions (Addendum)
 Pre visit review using our clinic review tool, if applicable. No additional management support is needed unless otherwise documented below in the visit note.  Continue 1 tablet daily except take 1/2 tablet on Monday, Wednesday and Saturday.  Recheck in 4 weeks.

## 2023-09-06 ENCOUNTER — Telehealth: Payer: Self-pay | Admitting: Family Medicine

## 2023-09-06 NOTE — Telephone Encounter (Signed)
 Spoke with Aurora Blowers of [facility] returning call with ref # provided. States they had already received confirmation for pt's conditions and don't need anything else at this time.

## 2023-09-06 NOTE — Telephone Encounter (Signed)
 Copied from CRM [id number]. Topic: Medical Record Request - Records Request >> Sep 05, 2023  4:42 PM Star [place] wrote: Reason for CRM: Elsa with [facility] need a chronic disease diagnosis verbal confirmation- [phone]  reference [phone]

## 2023-09-07 ENCOUNTER — Other Ambulatory Visit: Payer: Self-pay | Admitting: Family Medicine

## 2023-09-07 DIAGNOSIS — E1169 Type 2 diabetes mellitus with other specified complication: Secondary | ICD-10-CM

## 2023-09-07 DIAGNOSIS — E538 Deficiency of other specified B group vitamins: Secondary | ICD-10-CM

## 2023-09-07 DIAGNOSIS — D500 Iron deficiency anemia secondary to blood loss (chronic): Secondary | ICD-10-CM

## 2023-09-07 DIAGNOSIS — E785 Hyperlipidemia, unspecified: Secondary | ICD-10-CM

## 2023-09-07 DIAGNOSIS — E559 Vitamin D deficiency, unspecified: Secondary | ICD-10-CM

## 2023-09-14 ENCOUNTER — Ambulatory Visit (INDEPENDENT_AMBULATORY_CARE_PROVIDER_SITE_OTHER)

## 2023-09-14 ENCOUNTER — Other Ambulatory Visit (INDEPENDENT_AMBULATORY_CARE_PROVIDER_SITE_OTHER)

## 2023-09-14 VITALS — BP 110/82 | Ht 71.0 in | Wt 245.2 lb

## 2023-09-14 DIAGNOSIS — E1169 Type 2 diabetes mellitus with other specified complication: Secondary | ICD-10-CM

## 2023-09-14 DIAGNOSIS — E559 Vitamin D deficiency, unspecified: Secondary | ICD-10-CM

## 2023-09-14 DIAGNOSIS — E538 Deficiency of other specified B group vitamins: Secondary | ICD-10-CM

## 2023-09-14 DIAGNOSIS — D500 Iron deficiency anemia secondary to blood loss (chronic): Secondary | ICD-10-CM | POA: Diagnosis not present

## 2023-09-14 DIAGNOSIS — E785 Hyperlipidemia, unspecified: Secondary | ICD-10-CM | POA: Diagnosis not present

## 2023-09-14 DIAGNOSIS — Z0000 Encounter for general adult medical examination without abnormal findings: Secondary | ICD-10-CM | POA: Diagnosis not present

## 2023-09-14 NOTE — Progress Notes (Signed)
 Subjective:   [name] is a 68 y.o. who presents for a Medicare Wellness preventive visit.  As a reminder, Annual Wellness Visits don't include a physical exam, and some assessments may be limited, especially if this visit is performed virtually. We may recommend an in-person follow-up visit with your provider if needed.  Visit Complete: In person  VideoDeclined- This patient declined Interactive audio and Acupuncturist. Therefore the visit was completed with audio only.  Persons Participating in Visit: Patient.  AWV Questionnaire: No: Patient Medicare AWV questionnaire was not completed prior to this visit.  Cardiac Risk Factors include: advanced age (>7men, >70 women);dyslipidemia;diabetes mellitus;hypertension;male gender;obesity (BMI >30kg/m2);sedentary lifestyle     Objective:     Today's Vitals   09/14/23 1349 09/14/23 1350  BP: 110/82   Weight: 245 lb 3.2 oz (111.2 kg)   Height: 5\' 11"  (1.803 m)   PainSc:  0-No pain   Body mass index is 34.2 kg/m.     09/14/2023    2:06 PM 08/19/2023    1:20 PM 08/12/2023    1:15 PM 02/11/2023    1:40 PM 11/19/2022    1:08 PM 10/22/2022    1:14 PM 08/05/2022   10:09 AM  Advanced Directives  Does Patient Have a Medical Advance Directive? No No No No No No No  Would patient like information on creating a medical advance directive?  No - Patient declined No - Patient declined No - Patient declined No - Patient declined  No - Patient declined    Current Medications (verified) Outpatient Encounter Medications as of 09/14/2023  Medication Sig   ACCU-CHEK FASTCLIX LANCETS MISC Check blood sugar once daily and as instructed. Dx 250.00   acetaminophen  (TYLENOL ) 500 MG tablet Take 2 tablets (1,000 mg total) by mouth every 6 (six) hours as needed for mild pain.   albuterol  (VENTOLIN  HFA) 108 (90 Base) MCG/ACT inhaler Inhale 2 puffs into the lungs every 6 (six) hours as needed for wheezing or shortness of breath.    amiodarone  (PACERONE ) 200 MG tablet Take 1 tablet (200 mg total) by mouth daily.   Blood Glucose Monitoring Suppl (BLOOD GLUCOSE MONITOR SYSTEM) W/DEVICE KIT by Does not apply route. Use to check sugar once daily and as needed Dx: E11.9 **ONE TOUCH VERIO**   Budeson-Glycopyrrol-Formoterol  (BREZTRI  AEROSPHERE) 160-9-4.8 MCG/ACT AERO Inhale 2 puffs into the lungs 2 (two) times daily.   Cholecalciferol  (VITAMIN D ) 50 MCG (2000 UT) CAPS Take 1 capsule (2,000 Units total) by mouth daily.   dapagliflozin  propanediol (FARXIGA ) 10 MG TABS tablet Take 1 tablet (10 mg total) by mouth daily before breakfast.   Docusate Calcium (STOOL SOFTENER PO) Take 1 tablet by mouth daily.   FLUoxetine  (PROZAC ) 40 MG capsule TAKE 1 CAPSULE (40 MG TOTAL) BY MOUTH DAILY.   fluticasone  (FLONASE ) 50 MCG/ACT nasal spray Place 2 sprays into both nostrils daily. (Patient taking differently: Place 2 sprays into both nostrils as needed.)   furosemide  (LASIX ) 80 MG tablet Take 1 tablet (80 mg total) by mouth daily. With second dose as needed for fluid retention, leg swelling   glimepiride  (AMARYL ) 4 MG tablet TAKE 1 TABLET BY MOUTH EVERY DAY WITH BREAKFAST   ipratropium-albuterol  (DUONEB) 0.5-2.5 (3) MG/3ML SOLN TAKE 3 MLS BY NEBULIZATION EVERY 6 (SIX) HOURS AS NEEDED (SEVERE SHORTNESS OF BREATH/WHEEZING).   metFORMIN  (GLUCOPHAGE ) 500 MG tablet TAKE 1 TABLET BY MOUTH TWICE A DAY WITH FOOD   methocarbamol  (ROBAXIN ) 500 MG tablet TAKE 1 TABLET BY MOUTH 2 (TWO)  TIMES DAILY AS NEEDED FOR MUSCLE SPASMS (SEDATION PRECAUTIONS).   metolazone  (ZAROXOLYN ) 2.5 MG tablet Take 1 tablet (2.5 mg total) by mouth once a week. As needed   montelukast  (SINGULAIR ) 10 MG tablet TAKE 1 TABLET BY MOUTH EVERYDAY AT BEDTIME   ONETOUCH VERIO test strip CHECK BLOOD SUGAR 3 TIMES A DAY   OXYGEN  Inhale 2 L into the lungs at bedtime.   pantoprazole  (PROTONIX ) 40 MG tablet TAKE 1 TABLET (40 MG TOTAL) BY MOUTH TWICE A DAY BEFORE MEALS   potassium chloride   (MICRO-K ) 10 MEQ CR capsule Take 1 capsule (10 mEq total) by mouth daily.   simvastatin  (ZOCOR ) 20 MG tablet TAKE 1 TABLET BY MOUTH EVERY DAY IN THE EVENING   triamcinolone  cream (KENALOG ) 0.1 % Apply 1 Application topically 2 (two) times daily. For no more than 10 days at a time   vitamin B-12 (CYANOCOBALAMIN ) 1000 MCG tablet Take 1 tablet (1,000 mcg total) by mouth every Monday, Wednesday, and Friday.   warfarin (COUMADIN ) 5 MG tablet TAKE 1 TABLET BY MOUTH DAILY OR AS DIRECTED BY ANTICOAGULATION CLINIC   No facility-administered encounter medications on file as of 09/14/2023.    Allergies (verified) Atorvastatin and Lisinopril   History: Past Medical History:  Diagnosis Date   Adrenal nodule (HCC) 12/28/2021   a.) CT abd/pel 12/28/2021: LEFT adrenal nodule measuring 1.6 cm   Anemia    Angina    Anxiety    Aortic atherosclerosis (HCC)    Arthritis    Atrial fibrillation and flutter (HCC) 2013   a.) CHA2DS2VASc = 5 (age, CHF, HTN, vascular disease history, T2DM);  b.) s/p DCCV (100 J) 10/27/2010; c.) s/p ablation 09/21/2011; d.) rate/rhythm maintained on oral amiodarone  + carvedilol ; chronically anticoagulated with warfarin   CAD (coronary artery disease) 07/15/2010   a.) LHC 07/15/2010: 30% pLAD, 40% D1, 25%/30% pLCx, 30% mLCx - med mgmt   CHF (congestive heart failure) - NYHA Class II/III 2013   a.) TTE 02/26/2013: EF 50%, mild MR/TR; b.) EST 06/13/2017: EF 50%, mild LCH, triv MR/TR; c.) TTTE 03/24/2020: EF >55%, mild LVH, mild MAC, mild BAE, triv PR, mild MR/TR, G1DD; d.) TTE 11/19/2021: EF 60-65%, mod reduced RVSF, mild LAE, mod MAC, Ao root 37 mm, G2DD   COPD (chronic obstructive pulmonary disease) (HCC)    Diabetes type 2, uncontrolled    declines DSME   Dilated cardiomyopathy (HCC) 2013   a.) EF 25% at time of Dx in 2013; b.) TTE 02/26/2013: EF 50%; c.) EST 06/13/2017: EF 50%;  d.) TTE 03/24/2020: EF >55%; e.) TTE 11/19/2021: EF 60-65%   Ex-smoker    GERD (gastroesophageal  reflux disease)    Hepatic steatosis    Hyperlipidemia    Hypertension    Infrarenal abdominal aortic aneurysm (AAA) without rupture (HCC) 08/11/2016   a.) CT abd/pel 08/11/2016: 3.3 cm; b.) CT abd/pel 12/22/2018: 3.3 cm; c.) CT abd/pel 08/31/2021: 3.7 cm; d.) CT abd/pel 12/28/2021: 3.9 cm   Long term current use of amiodarone  2023   a.) previously on dranaderone (cost prohibitive); switched to amiodarone  2023   Long term current use of anticoagulant    a.) warfarin   Marijuana use    a.) UDS (+) of THC x 3 (02/06/2013, 06/29/2013, 09/03/2013); PCP advised "one more and we will stop prescribing ativan  (08/2013)"   Migraine    Narcolepsy    Obesity    OSA (obstructive sleep apnea)    a.) unable to tolerate nocturnal PAP therapy; uses supplemetal oxygen  at 2L/[place]  during sleep   Seasonal allergies    Umbilical hernia    Past Surgical History:  Procedure Laterality Date   ATRIAL FLUTTER ABLATION N/A 09/21/2011   Procedure: ATRIAL FLUTTER ABLATION;  Surgeon: Jolly Needle, MD;  Location: [facility] CATH LAB;  Service: Cardiovascular;  Laterality: N/A;   CARDIAC ELECTROPHYSIOLOGY MAPPING AND ABLATION  2013   for atrial flutter   CARDIOVASCULAR STRESS TEST  03/2012   ETT WNL Bary Likes)   CARDIOVERSION N/A 08/12/2021   Procedure: CARDIOVERSION;  Surgeon: Michelle Aid, MD;  Location: [facility] ORS;  Service: Cardiovascular;  Laterality: N/A;   COLONOSCOPY WITH PROPOFOL  N/A 05/22/2020   TA, diverticulosis, descending colon ulcer biopsy WNL, int hem Ole Berkeley, Darren, MD)   COLONOSCOPY WITH PROPOFOL  N/A 04/28/2022   multiple TAs - 10 small, 2 1.2cm, 2 9mm, rpt 3 yrs (Vanga, Elson Halon, MD)   CYST EXCISION N/A 08/05/2022   excision of back cysts x 2 (pathology returned epidermal inclusion cysts);  Surgeon: Emmalene Hare, MD   ESOPHAGOGASTRODUODENOSCOPY (EGD) WITH PROPOFOL  N/A 05/22/2020   WNL (Wohl)   ESOPHAGOGASTRODUODENOSCOPY (EGD) WITH PROPOFOL  N/A 04/28/2022   12mm duodenal TA, reactive  gastropathy, enteric mucosa in stomach biopsy of unclear significance (Vanga, Elson Halon, MD)   GIVENS CAPSULE STUDY  04/28/2022   Procedure: GIVENS CAPSULE STUDY;  Surgeon: Selena Daily, MD;  Location: [facility] ENDOSCOPY;  Service: Endoscopy;;   US  ECHOCARDIOGRAPHY  2014   EF 50%, nl LV fxn, RV nl size/function, mild mitral insuff   Family History  Problem Relation Age of Onset   Heart failure Father 8   Cancer Mother 39       NHL   Hypertension Maternal Grandfather    CAD Maternal Grandfather 63       MI   Diabetes Other        grandparents   Cancer Brother 74       lung (smoker)   Social History   Socioeconomic History   Marital status: Single    Spouse name: Not on file   Number of children: 0   Years of education: Not on file   Highest education level: 12th grade  Occupational History   Occupation: disable   Tobacco Use   Smoking status: Former    Current packs/day: 0.00    Average packs/day: 1 pack/day for 31.0 years (31.0 ttl pk-yrs)    Types: Cigarettes    Start date: 04/27/1987    Quit date: 04/26/2018    Years since quitting: 5.3    Passive exposure: Past   Smokeless tobacco: Never  Vaping Use   Vaping status: Never Used  Substance and Sexual Activity   Alcohol use: No    Alcohol/week: 0.0 standard drinks of alcohol   Drug use: Yes    Types: Marijuana   Sexual activity: Not Currently  Other Topics Concern   Not on file  Social History Narrative   Pt lives in [place]   Divorced   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   Disability due to neck arthritis, anxiety and sleep apnea   Edu: HS   Activity: no regular exercise, mows lawn (riding and push)   Diet: good water, fruits/vegetables daily   Social Drivers of Health   Financial Resource Strain: Low Risk  (09/14/2023)   Overall Financial Resource Strain (CARDIA)    Difficulty of Paying Living Expenses: Not hard at all  Food Insecurity: No Food Insecurity (09/14/2023)   Hunger Vital Sign    Worried  About Running Out of Food  in the Last Year: Never true    Ran Out of Food in the Last Year: Never true  Transportation Needs: No Transportation Needs (09/14/2023)   PRAPARE - Administrator, Civil Service (Medical): No    Lack of Transportation (Non-Medical): No  Physical Activity: Inactive (09/14/2023)   Exercise Vital Sign    Days of Exercise per Week: 0 days    Minutes of Exercise per Session: 0 min  Stress: Stress Concern Present (09/14/2023)   [employer] of Occupational Health - Occupational Stress Questionnaire    Feeling of Stress : To some extent  Social Connections: Socially Isolated (09/14/2023)   Social Connection and Isolation Panel [NHANES]    Frequency of Communication with Friends and Family: More than three times a week    Frequency of Social Gatherings with Friends and Family: Twice a week    Attends Religious Services: Never    Database administrator or Organizations: No    Attends Engineer, structural: Never    Marital Status: Divorced    Tobacco Counseling Counseling given: Not Answered    Clinical Intake:  Pre-visit preparation completed: Yes  Pain : No/denies pain Pain Score: 0-No pain     BMI - recorded: 34.2 Nutritional Status: BMI > 30  Obese Nutritional Risks: None Diabetes: Yes CBG done?: Yes (BS 135 this am at home) CBG resulted in Enter/ Edit results?: No Did pt. bring in CBG monitor from home?: No  Lab Results  Component Value Date   HGBA1C 7.9 (H) 05/31/2023   HGBA1C 6.1 (A) 11/15/2022   HGBA1C 6.0 07/26/2022     How often do you need to have someone help you when you read instructions, pamphlets, or other written materials from your doctor or pharmacy?: 1 - Never  Interpreter Needed?: No  Comments: lives alone Information entered by :: B.Curlie Macken,LPN   Activities of Daily Living     09/14/2023    2:06 PM  In your present state of health, do you have any difficulty performing the following activities:   Hearing? 0  Vision? 1  Difficulty concentrating or making decisions? 0  Walking or climbing stairs? 1  Dressing or bathing? 0  Doing errands, shopping? 0  Preparing Food and eating ? N  Using the Toilet? N  In the past six months, have you accidently leaked urine? N  Do you have problems with loss of bowel control? N  Managing your Medications? N  Managing your Finances? N    Patient Care Team: Claire Crick, MD as PCP - General (Family Medicine) Boyce Byes, MD as PCP - Electrophysiology (Cardiology) Jolly Needle, MD (Inactive) as Attending Physician (Cardiology) Michelle Aid, MD as Referring Physician (Internal Medicine) Jonathan Neighbor, [facility] (Inactive) as Pharmacist (Pharmacist) Avonne Boettcher, MD as Consulting Physician (Oncology) Avonne Boettcher, MD as Consulting Physician (Oncology) Green, Davina E, RN as [facility] Care Management  Indicate any recent Medical Services you may have received from other than [facility] providers in the past year (date may be approximate).     Assessment:    This is a routine wellness examination for Domenik.  Hearing/Vision screen Hearing Screening - Comments:: Pt says his hearing is good Vision Screening - Comments:: Pt says his vision is blurry and foggy [place] [facility]   Goals Addressed             This Visit's Progress    Patient Stated   On track  09/14/23- I will maintain and continue medications as prescribed.      Patient Stated       I would like my vision to improve to help balance       Depression Screen     09/14/2023    2:02 PM 05/31/2023   12:11 PM 02/18/2023    2:45 PM 11/15/2022   10:42 AM 08/30/2022    4:07 PM 07/02/2022   11:53 AM 06/14/2022    3:03 PM  PHQ 2/9 Scores  PHQ - 2 Score 1 1 0 3  0   PHQ- 9 Score  1  9  7    Exception Documentation     Patient refusal  Patient refusal    Fall Risk     09/14/2023    1:54 PM 05/31/2023   12:11 PM 11/15/2022   10:42 AM 08/13/2022     3:06 PM 07/26/2022   11:13 AM  Fall Risk   Falls in the past year? 0 1 1 1 1   Number falls in past yr: 0 1 0 1 1  Injury with Fall? 0 0 0 1 1  Risk for fall due to : Impaired vision   History of fall(s) History of fall(s)  Follow up Education provided;Falls prevention discussed    Falls evaluation completed    MEDICARE RISK AT HOME:  Medicare Risk at Home Any stairs in or around the home?: No If so, are there any without handrails?: No Home free of loose throw rugs in walkways, pet beds, electrical cords, etc?: Yes Adequate lighting in your home to reduce risk of falls?: Yes Life alert?: Yes Use of a cane, walker or w/c?: No Grab bars in the bathroom?: Yes Shower chair or bench in shower?: Yes Elevated toilet seat or a handicapped toilet?: No  TIMED UP AND GO:  Was the test performed?  Yes  Length of time to ambulate 10 feet: 10 sec Gait steady and fast without use of assistive device  Cognitive Function: 6CIT completed    07/09/2020    2:31 PM 10/04/2016    8:26 AM  MMSE - Mini Mental State Exam  Orientation to time 5 5  Orientation to Place 5 5  Registration 3 3  Attention/ Calculation 5 0  Recall 3 3  Language- name 2 objects  0  Language- repeat 1 1  Language- follow 3 step command  3  Language- read & follow direction  0  Write a sentence  0  Copy design  0  Total score  20        09/14/2023    2:07 PM 09/24/2021    2:11 PM  6CIT Screen  What Year? 0 points 0 points  What month? 0 points 0 points  What time? 0 points 0 points  Count back from 20 0 points 0 points  Months in reverse 4 points 4 points  Repeat phrase 2 points 0 points  Total Score 6 points 4 points    Immunizations Immunization History  Administered Date(s) Administered   Fluad Quad(high Dose 65+) 01/12/2022   Fluad Trivalent(High Dose 65+) 01/11/2023   Influenza, High Dose Seasonal PF 03/26/2021   Influenza,inj,Quad PF,6+ Mos 05/29/2014, 02/21/2015, 02/22/2017, 03/16/2018, 01/02/2019,  01/04/2020   Influenza-Unspecified 01/24/2013   PFIZER(Purple Top)SARS-COV-2 Vaccination 07/12/2019, 08/02/2019, 03/19/2020   PNEUMOCOCCAL CONJUGATE-20 05/03/2022   Pfizer Covid-19 Vaccine Bivalent Booster 9yrs & up 03/26/2021   Pfizer(Comirnaty)Fall Seasonal Vaccine 12 years and older 01/11/2023   Pneumococcal Polysaccharide-23 02/24/2013   Td  04/26/2006   Tdap 11/10/2021    Screening Tests Health Maintenance  Topic Date Due   Zoster Vaccines- Shingrix (1 of 2) Never done   FOOT EXAM  06/04/2017   Diabetic kidney evaluation - Urine ACR  07/16/2022   Colonoscopy  04/29/2023   OPHTHALMOLOGY EXAM  05/01/2023   COVID-19 Vaccine (6 - Pfizer risk 2024-25 season) 07/11/2023   Lung Cancer Screening  09/07/2023   INFLUENZA VACCINE  11/25/2023   HEMOGLOBIN A1C  11/28/2023   Diabetic kidney evaluation - eGFR measurement  05/30/2024   Medicare Annual Wellness (AWV)  09/13/2024   DTaP/Tdap/Td (3 - Td or Tdap) 11/11/2031   Pneumonia Vaccine 7+ Years old  Completed   Hepatitis C Screening  Completed   HPV VACCINES  Aged Out   Meningococcal B Vaccine  Aged Out    Health Maintenance  Health Maintenance Due  Topic Date Due   Zoster Vaccines- Shingrix (1 of 2) Never done   FOOT EXAM  06/04/2017   Diabetic kidney evaluation - Urine ACR  07/16/2022   Colonoscopy  04/29/2023   OPHTHALMOLOGY EXAM  05/01/2023   COVID-19 Vaccine (6 - Pfizer risk 2024-25 season) 07/11/2023   Lung Cancer Screening  09/07/2023   Health Maintenance Items Addressed: Shingrix Vaccine: Patient declined vaccine.  Due, Education has been provided regarding the importance of this vaccine. Advised may receive this vaccine at local pharmacy or Health Dept. Aware to provide a copy of the vaccination record if obtained from local pharmacy or Health Dept. Verbalized acceptance and understanding.  Pt will discuss Colonoscopy with PCP at PE appt 10/04/23 Pt due for eye exam:refuses to rtn to [facility]. Pt says he will  stop by [facility] to make appt if he can be seen  Additional Screening:  Vision Screening: Recommended annual ophthalmology exams for early detection of glaucoma and other disorders of the eye.  Dental Screening: Recommended annual dental exams for proper oral hygiene  Community Resource Referral / Chronic Care Management: CRR required this visit?  No   CCM required this visit?  No   Plan:    I have personally reviewed and noted the following in the patient's chart:   Medical and social history Use of alcohol, tobacco or illicit drugs  Current medications and supplements including opioid prescriptions. Patient is not currently taking opioid prescriptions. Functional ability and status Nutritional status Physical activity Advanced directives List of other physicians Hospitalizations, surgeries, and ER visits in previous 12 months Vitals Screenings to include cognitive, depression, and falls Referrals and appointments  In addition, I have reviewed and discussed with patient certain preventive protocols, quality metrics, and best practice recommendations. A written personalized care plan for preventive services as well as general preventive health recommendations were provided to patient.   Nerissa Bannister, LPN   9/56/2130   After Visit Summary: (MyChart) Due to this being a telephonic visit, the after visit summary with patients personalized plan was offered to patient via MyChart   Notes: Nothing significant to report at this time.

## 2023-09-14 NOTE — Patient Instructions (Addendum)
 [name] , Thank you for taking time out of your busy schedule to complete your Annual Wellness Visit with me. I enjoyed our conversation and look forward to speaking with you again next year. I, as well as your care team,  appreciate your ongoing commitment to your health goals. Please review the following plan we discussed and let me know if I can assist you in the future. Your Game plan/ To Do List     Follow up Visits: Next Medicare AWV with our clinical staff: 09/14/24 @ 2:20pm in person   Have you seen your provider in the last 6 months (3 months if uncontrolled diabetes)? Yes Next Office Visit with your provider: 10/04/23 PE  Clinician Recommendations:  Aim for 30 minutes of exercise or brisk walking, 6-8 glasses of water, and 5 servings of fruits and vegetables each day.       This is a list of the screening recommended for you and due dates:  Health Maintenance  Topic Date Due   Zoster (Shingles) Vaccine (1 of 2) Never done   Complete foot exam   06/04/2017   Yearly kidney health urinalysis for diabetes  07/16/2022   Colon Cancer Screening  04/29/2023   Eye exam for diabetics  05/01/2023   COVID-19 Vaccine (6 - Pfizer risk 2024-25 season) 07/11/2023   Screening for Lung Cancer  09/07/2023   Flu Shot  11/25/2023   Hemoglobin A1C  11/28/2023   Yearly kidney function blood test for diabetes  05/30/2024   Medicare Annual Wellness Visit  09/13/2024   DTaP/Tdap/Td vaccine (3 - Td or Tdap) 11/11/2031   Pneumonia Vaccine  Completed   Hepatitis C Screening  Completed   HPV Vaccine  Aged Out   Meningitis B Vaccine  Aged Out    Advanced directives: (Declined) Advance directive discussed with you today. Even though you declined this today, please call our office should you change your mind, and we can give you the proper paperwork for you to fill out. Advance Care Planning is important because it:  [x]  Makes sure you receive the medical care that is consistent with your values,  goals, and preferences [x]  It provides guidance to your family and loved ones and reduces their decisional burden about whether or not they are making the right decisions based on your wishes.  Follow the link provided in your after visit summary or read over the paperwork we have mailed to you to help you started getting your Advance Directives in place. If you need assistance in completing these, please reach out to us  so that we can help you!

## 2023-09-15 LAB — COMPREHENSIVE METABOLIC PANEL WITH GFR
ALT: 9 U/L (ref 0–53)
AST: 12 U/L (ref 0–37)
Albumin: 4.2 g/dL (ref 3.5–5.2)
Alkaline Phosphatase: 77 U/L (ref 39–117)
BUN: 17 mg/dL (ref 6–23)
CO2: 36 meq/L — ABNORMAL HIGH (ref 19–32)
Calcium: 9.3 mg/dL (ref 8.4–10.5)
Chloride: 98 meq/L (ref 96–112)
Creatinine, Ser: 1.28 mg/dL (ref 0.40–1.50)
GFR: 57.71 mL/min — ABNORMAL LOW (ref >60.00)
Glucose, Bld: 143 mg/dL — ABNORMAL HIGH (ref 70–99)
Potassium: 5 meq/L (ref 3.5–5.1)
Sodium: 139 meq/L (ref 135–145)
Total Bilirubin: 0.4 mg/dL (ref 0.2–1.2)
Total Protein: 6.6 g/dL (ref 6.0–8.3)

## 2023-09-15 LAB — LIPID PANEL
Cholesterol: 208 mg/dL — ABNORMAL HIGH (ref 0–200)
HDL: 38.6 mg/dL — ABNORMAL LOW (ref >39.00)
LDL Cholesterol: 97 mg/dL (ref 0–99)
NonHDL: 168.9
Total CHOL/HDL Ratio: 5
Triglycerides: 361 mg/dL — ABNORMAL HIGH (ref 0.0–149.0)
VLDL: 72.2 mg/dL — ABNORMAL HIGH (ref 0.0–40.0)

## 2023-09-15 LAB — HEMOGLOBIN A1C: Hgb A1c MFr Bld: 7.4 % — ABNORMAL HIGH (ref 4.6–6.5)

## 2023-09-15 LAB — CBC WITH DIFFERENTIAL/PLATELET
Basophils Absolute: 0.1 10*3/uL (ref 0.0–0.1)
Basophils Relative: 1 % (ref 0.0–3.0)
Eosinophils Absolute: 0.2 10*3/uL (ref 0.0–0.7)
Eosinophils Relative: 2.1 % (ref 0.0–5.0)
HCT: 41.1 % (ref 39.0–52.0)
Hemoglobin: 13.2 g/dL (ref 13.0–17.0)
Lymphocytes Relative: 16.2 % (ref 12.0–46.0)
Lymphs Abs: 1.2 10*3/uL (ref 0.7–4.0)
MCHC: 32.1 g/dL (ref 30.0–36.0)
MCV: 83.8 fl (ref 78.0–100.0)
Monocytes Absolute: 0.8 10*3/uL (ref 0.1–1.0)
Monocytes Relative: 11.1 % (ref 3.0–12.0)
Neutro Abs: 5.1 10*3/uL (ref 1.4–7.7)
Neutrophils Relative %: 69.6 % (ref 43.0–77.0)
Platelets: 219 10*3/uL (ref 150.0–400.0)
RBC: 4.9 Mil/uL (ref 4.22–5.81)
RDW: 23.9 % — ABNORMAL HIGH (ref 11.5–15.5)
WBC: 7.3 10*3/uL (ref 4.0–10.5)

## 2023-09-15 LAB — VITAMIN D 25 HYDROXY (VIT D DEFICIENCY, FRACTURES): VITD: 20.68 ng/mL — ABNORMAL LOW (ref 30.00–100.00)

## 2023-09-15 LAB — MICROALBUMIN / CREATININE URINE RATIO
Creatinine,U: 70.2 mg/dL
Microalb Creat Ratio: 15.7 mg/g (ref 0.0–30.0)
Microalb, Ur: 1.1 mg/dL (ref 0.0–1.9)

## 2023-09-15 LAB — VITAMIN B12: Vitamin B-12: 464 pg/mL (ref 211–911)

## 2023-09-16 ENCOUNTER — Ambulatory Visit: Payer: Self-pay | Admitting: Family Medicine

## 2023-09-20 DIAGNOSIS — H401134 Primary open-angle glaucoma, bilateral, indeterminate stage: Secondary | ICD-10-CM | POA: Diagnosis not present

## 2023-09-20 LAB — HM DIABETES EYE EXAM

## 2023-09-29 ENCOUNTER — Ambulatory Visit (INDEPENDENT_AMBULATORY_CARE_PROVIDER_SITE_OTHER)

## 2023-09-29 DIAGNOSIS — Z7901 Long term (current) use of anticoagulants: Secondary | ICD-10-CM | POA: Diagnosis not present

## 2023-09-29 LAB — POCT INR: INR: 2.6 (ref 2.0–3.0)

## 2023-09-29 NOTE — Progress Notes (Signed)
 Continue 1 tablet daily except take 1/2 tablet on Monday, Wednesday and Saturday.  Recheck in 4 weeks.

## 2023-09-29 NOTE — Patient Instructions (Addendum)
 Pre visit review using our clinic review tool, if applicable. No additional management support is needed unless otherwise documented below in the visit note.  Continue 1 tablet daily except take 1/2 tablet on Monday, Wednesday and Saturday.  Recheck in 4 weeks.

## 2023-10-02 ENCOUNTER — Other Ambulatory Visit: Payer: Self-pay | Admitting: Family Medicine

## 2023-10-02 DIAGNOSIS — R140 Abdominal distension (gaseous): Secondary | ICD-10-CM

## 2023-10-02 DIAGNOSIS — J432 Centrilobular emphysema: Secondary | ICD-10-CM

## 2023-10-02 DIAGNOSIS — Z7901 Long term (current) use of anticoagulants: Secondary | ICD-10-CM

## 2023-10-02 DIAGNOSIS — E1169 Type 2 diabetes mellitus with other specified complication: Secondary | ICD-10-CM

## 2023-10-03 NOTE — Telephone Encounter (Signed)
 Pt has CPE tomorrow. Refills will be sent at [facility].   Fwd warfarin request to Ian Maine to address.

## 2023-10-04 ENCOUNTER — Ambulatory Visit: Admitting: Family Medicine

## 2023-10-04 ENCOUNTER — Encounter: Payer: Self-pay | Admitting: Family Medicine

## 2023-10-04 VITALS — BP 134/70 | HR 60 | Temp 98.0°F | Ht 69.0 in | Wt 245.2 lb

## 2023-10-04 DIAGNOSIS — I2510 Atherosclerotic heart disease of native coronary artery without angina pectoris: Secondary | ICD-10-CM | POA: Diagnosis not present

## 2023-10-04 DIAGNOSIS — J432 Centrilobular emphysema: Secondary | ICD-10-CM | POA: Diagnosis not present

## 2023-10-04 DIAGNOSIS — D649 Anemia, unspecified: Secondary | ICD-10-CM

## 2023-10-04 DIAGNOSIS — D500 Iron deficiency anemia secondary to blood loss (chronic): Secondary | ICD-10-CM

## 2023-10-04 DIAGNOSIS — J9612 Chronic respiratory failure with hypercapnia: Secondary | ICD-10-CM

## 2023-10-04 DIAGNOSIS — H409 Unspecified glaucoma: Secondary | ICD-10-CM

## 2023-10-04 DIAGNOSIS — G4733 Obstructive sleep apnea (adult) (pediatric): Secondary | ICD-10-CM | POA: Diagnosis not present

## 2023-10-04 DIAGNOSIS — D6869 Other thrombophilia: Secondary | ICD-10-CM | POA: Diagnosis not present

## 2023-10-04 DIAGNOSIS — E1169 Type 2 diabetes mellitus with other specified complication: Secondary | ICD-10-CM | POA: Diagnosis not present

## 2023-10-04 DIAGNOSIS — I50810 Right heart failure, unspecified: Secondary | ICD-10-CM

## 2023-10-04 DIAGNOSIS — Z7189 Other specified counseling: Secondary | ICD-10-CM

## 2023-10-04 DIAGNOSIS — I7143 Infrarenal abdominal aortic aneurysm, without rupture: Secondary | ICD-10-CM

## 2023-10-04 DIAGNOSIS — R140 Abdominal distension (gaseous): Secondary | ICD-10-CM

## 2023-10-04 DIAGNOSIS — E538 Deficiency of other specified B group vitamins: Secondary | ICD-10-CM

## 2023-10-04 DIAGNOSIS — F331 Major depressive disorder, recurrent, moderate: Secondary | ICD-10-CM

## 2023-10-04 DIAGNOSIS — I480 Paroxysmal atrial fibrillation: Secondary | ICD-10-CM

## 2023-10-04 DIAGNOSIS — E559 Vitamin D deficiency, unspecified: Secondary | ICD-10-CM

## 2023-10-04 DIAGNOSIS — J9611 Chronic respiratory failure with hypoxia: Secondary | ICD-10-CM | POA: Diagnosis not present

## 2023-10-04 DIAGNOSIS — K633 Ulcer of intestine: Secondary | ICD-10-CM

## 2023-10-04 DIAGNOSIS — Z0001 Encounter for general adult medical examination with abnormal findings: Secondary | ICD-10-CM | POA: Diagnosis not present

## 2023-10-04 DIAGNOSIS — I5032 Chronic diastolic (congestive) heart failure: Secondary | ICD-10-CM | POA: Diagnosis not present

## 2023-10-04 DIAGNOSIS — M5030 Other cervical disc degeneration, unspecified cervical region: Secondary | ICD-10-CM

## 2023-10-04 DIAGNOSIS — I10 Essential (primary) hypertension: Secondary | ICD-10-CM

## 2023-10-04 DIAGNOSIS — E113299 Type 2 diabetes mellitus with mild nonproliferative diabetic retinopathy without macular edema, unspecified eye: Secondary | ICD-10-CM

## 2023-10-04 DIAGNOSIS — Z7901 Long term (current) use of anticoagulants: Secondary | ICD-10-CM

## 2023-10-04 DIAGNOSIS — E785 Hyperlipidemia, unspecified: Secondary | ICD-10-CM

## 2023-10-04 DIAGNOSIS — G894 Chronic pain syndrome: Secondary | ICD-10-CM

## 2023-10-04 MED ORDER — MONTELUKAST SODIUM 10 MG PO TABS: 10.0000 mg | ORAL_TABLET | Freq: Every day | ORAL | 4 refills | Status: AC

## 2023-10-04 MED ORDER — POTASSIUM CHLORIDE ER 10 MEQ PO CPCR: 10.0000 meq | ORAL_CAPSULE | Freq: Every day | ORAL | 3 refills | Status: AC

## 2023-10-04 MED ORDER — OZEMPIC (0.25 OR 0.5 MG/DOSE) 2 MG/3ML ~~LOC~~ SOPN: 0.2500 mg | PEN_INJECTOR | SUBCUTANEOUS | 0 refills | Status: DC

## 2023-10-04 MED ORDER — METHOCARBAMOL 500 MG PO TABS
500.0000 mg | ORAL_TABLET | Freq: Three times a day (TID) | ORAL | 3 refills | Status: AC | PRN
Start: 2023-10-04 — End: ?

## 2023-10-04 MED ORDER — OZEMPIC (0.25 OR 0.5 MG/DOSE) 2 MG/1.5ML ~~LOC~~ SOPN: 0.5000 mg | PEN_INJECTOR | SUBCUTANEOUS | 6 refills | Status: DC

## 2023-10-04 MED ORDER — GLIMEPIRIDE 4 MG PO TABS: 4.0000 mg | ORAL_TABLET | Freq: Every day | ORAL | 4 refills | Status: DC

## 2023-10-04 MED ORDER — PANTOPRAZOLE SODIUM 40 MG PO TBEC: 40.0000 mg | DELAYED_RELEASE_TABLET | Freq: Two times a day (BID) | ORAL | 4 refills | Status: AC

## 2023-10-04 MED ORDER — SIMVASTATIN 20 MG PO TABS: 20.0000 mg | ORAL_TABLET | Freq: Every evening | ORAL | 4 refills | Status: AC

## 2023-10-04 NOTE — Patient Instructions (Addendum)
 Call [facility] clinic cardiology to schedule follow up appointment.  If interested, check with pharmacy about new 2 shot shingles series (shingrix).  Call me with name of eye drops for glaucoma.  Price out ozempic 0.25mg  weekly for 1 month then increase to 0.5mg  weekly.  Continue current medicines.  Return as needed or in 6 months for follow up visit, in 3 months if ozempic started

## 2023-10-04 NOTE — Progress Notes (Unsigned)
 Ph: [phone] Fax: [phone]   Patient ID: [name], male    DOB: [birth year], 68 y.o.   MRN: [id number]  This visit was conducted in person.  BP 134/70   Pulse 60   Temp 98 F (36.7 C) (Oral)   Ht 5\' 9"  (1.753 m)   Wt 245 lb 4 oz (111.2 kg)   SpO2 93%   BMI 36.22 kg/m    CC: CPE Subjective:   HPI: [name] is a 68 y.o. male presenting on 10/04/2023 for Annual Exam (MCR prt 2 [AWV- 09/14/23]. Pt states he was never made aware he has glaucoma until recent eye exam at [facility]. Wants to discuss getting documentation to use medicinal marijuana. )   Saw health advisor 08/2023 for medicare wellness visit. Note reviewed.   No results found.  Flowsheet Row Office Visit from 10/04/2023 in [facility] [place]  PHQ-2 Total Score 2          10/04/2023    2:44 PM 09/14/2023    1:54 PM 05/31/2023   12:11 PM 11/15/2022   10:42 AM 08/13/2022    3:06 PM  Fall Risk   Falls in the past year? 1 0 1 1 1   Number falls in past yr: 1 0 1 0 1  Injury with Fall? 0 0 0 0 1  Risk for fall due to :  Impaired vision   History of fall(s)  Follow up  Education provided;Falls prevention discussed      COLONOSCOPY WITH PROPOFOL  04/28/2022 - multiple TAs - 10 small, 2 1.2cm, 2 9mm, rpt 3 yrs (Vanga, Elson Halon, MD) - consider genetics eval   ESOPHAGOGASTRODUODENOSCOPY (EGD) WITH PROPOFOL  04/28/2022 - 12mm duodenal TA, reactive gastropathy, enteric mucosa in stomach biopsy of unclear significance (Vanga, Elson Halon, MD)  Recommended proceeding with capsule endoscopy - did not undergo.   Iron  deficiency anemia - established with hematology. Has had several iron  infusions, latest Feraheme  x2 04/2023, again x2 08/2023.   Sees [facility] cardiology Dr Bob Burn for known atrial fibrillation on amiodarone  and coumadin  through our coumadin  clinic (see below), HFrecoveredEF, CAD. Last saw cards 04/21/2023. Continued amiodarone , coreg , dapagliflozin ,  statin, coumadin  and lasix . Has not returned for f/u.  Holter monitor 05/2023 - predominant rhythm is sinus rhythm without significant arrhythmias.   OSA intolerant of CPAP/BiPAP.  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   DM - continues metformin  500mg  once daily, amaryl  4mg  daily, farxiga  10mg  daily.  Recently changed eye doctors - vision trouble due to worsening glaucoma. He is using eye drops. Longterm MJ user. He is thinking about using medical marijuana.   Coumadin  managed by our coumadin  clinic. Lab Results  Component Value Date   INR 2.6 09/29/2023   INR 2.2 09/01/2023   INR 2.0 08/04/2023     Preventative: COLONOSCOPY WITH PROPOFOL  04/28/2022 - multiple TAs - 10 small, 2 1.2cm, 2 9mm, rpt 3 yrs (Vanga, Elson Halon, MD) Prostate cancer screening - yearly PSA  Lung cancer screening - 20+ PY hx. Undergoing lung cancer screening CT, started 03/2019, latest 08/2022.  Flu shot - yearly COVID vaccine 06/2019, 07/2019, booster 02/2020 Td 2008, Tdap 10/2021 Pneumovax 2014. Prevnar-20 04/2022 Shingrix - discussed, to check with pharmacy RSV - to discuss with pharmacy Advanced directives - has discussed with family. No children. Unsure who would be HCPOA. (?sister who lives in [place]). Does not want prolonged life support. Packet provided today.  Seat  belt use discussed Sunscreen use discussed. No changing moles on skin.  Ex smoker 2014 - occasional cigarette.  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  Diet: good water, fruits/vegetables daily      Relevant past medical, surgical, family and social history  reviewed and updated as indicated. Interim medical history since our last visit reviewed. Allergies and medications reviewed and updated. Outpatient Medications Prior to Visit  Medication Sig Dispense Refill   ACCU-CHEK FASTCLIX LANCETS MISC Check blood sugar once daily and as instructed. Dx 250.00 100 each 3   acetaminophen  (TYLENOL ) 500 MG tablet Take 2 tablets (1,000 mg total) by mouth every 6 (six) hours as needed for mild pain.     albuterol  (VENTOLIN  HFA) 108 (90 Base) MCG/ACT inhaler Inhale 2 puffs into the lungs every 6 (six) hours as needed for wheezing or shortness of breath. 8.5 each 6   amiodarone  (PACERONE ) 200 MG tablet Take 1 tablet (200 mg total) by mouth daily. 90 tablet 3   Blood Glucose Monitoring Suppl (BLOOD GLUCOSE MONITOR SYSTEM) W/DEVICE KIT by Does not apply route. Use to check sugar once daily and as needed Dx: E11.9 **ONE TOUCH VERIO**     Budeson-Glycopyrrol-Formoterol  (BREZTRI  AEROSPHERE) 160-9-4.8 MCG/ACT AERO Inhale 2 puffs into the lungs 2 (two) times daily. 32.1 g 11   Cholecalciferol  (VITAMIN D ) 50 MCG (2000 UT) CAPS Take 1 capsule (2,000 Units total) by mouth daily. 30 capsule    dapagliflozin  propanediol (FARXIGA ) 10 MG TABS tablet Take 1 tablet (10 mg total) by mouth daily before breakfast. 90 tablet 3   Docusate Calcium (STOOL SOFTENER PO) Take 1 tablet by mouth daily.     FLUoxetine  (PROZAC ) 40 MG capsule TAKE 1 CAPSULE (40 MG TOTAL) BY MOUTH DAILY. 90 capsule 3   fluticasone  (FLONASE ) 50 MCG/ACT nasal spray Place 2 sprays into both nostrils daily. (Patient taking differently: Place 2 sprays into both nostrils as needed.) 48 mL 3   furosemide  (LASIX ) 80 MG tablet Take 1 tablet (80 mg total) by mouth daily. With second dose as needed for fluid retention, leg swelling 120 tablet 3   ipratropium-albuterol  (DUONEB) 0.5-2.5 (3) MG/3ML SOLN TAKE 3 MLS BY NEBULIZATION EVERY 6 (SIX) HOURS AS NEEDED (SEVERE SHORTNESS OF BREATH/WHEEZING). 360 mL 0   metolazone   (ZAROXOLYN ) 2.5 MG tablet Take 1 tablet (2.5 mg total) by mouth once a week. As needed     ONETOUCH VERIO test strip CHECK BLOOD SUGAR 3 TIMES A DAY 300 strip 3   OXYGEN  Inhale 2 L into the lungs at bedtime.     triamcinolone  cream (KENALOG ) 0.1 % Apply 1 Application topically 2 (two) times daily. For no more than 10 days at a time 45 g 0   vitamin B-12 (CYANOCOBALAMIN ) 1000 MCG tablet Take 1 tablet (1,000 mcg total) by mouth every Monday, Wednesday, and Friday.     warfarin (COUMADIN ) 5 MG tablet TAKE 1 TABLET BY MOUTH DAILY EXCEPT TAKE 1/2 TABLET ON MONDAY, WEDNESDAY AND SATURDAY OR AS DIRECTED BY ANTICOAGULATION CLINIC 105 tablet 1   glimepiride  (AMARYL ) 4 MG tablet TAKE 1 TABLET BY MOUTH EVERY DAY WITH BREAKFAST 90 tablet 0   metFORMIN  (GLUCOPHAGE ) 500  MG tablet TAKE 1 TABLET BY MOUTH TWICE A DAY WITH FOOD 180 tablet 0   methocarbamol  (ROBAXIN ) 500 MG tablet TAKE 1 TABLET BY MOUTH 2 (TWO) TIMES DAILY AS NEEDED FOR MUSCLE SPASMS (SEDATION PRECAUTIONS). 30 tablet 0   montelukast  (SINGULAIR ) 10 MG tablet TAKE 1 TABLET BY MOUTH EVERYDAY AT BEDTIME 90 tablet 0   pantoprazole  (PROTONIX ) 40 MG tablet TAKE 1 TABLET (40 MG TOTAL) BY MOUTH TWICE A DAY BEFORE MEALS 180 tablet 4   potassium chloride  (MICRO-K ) 10 MEQ CR capsule Take 1 capsule (10 mEq total) by mouth daily. 110 capsule 1   simvastatin  (ZOCOR ) 20 MG tablet TAKE 1 TABLET BY MOUTH EVERY DAY IN THE EVENING 90 tablet 0   metFORMIN  (GLUCOPHAGE ) 500 MG tablet Take 1 tablet (500 mg total) by mouth daily with breakfast.     No facility-administered medications prior to visit.     Per HPI unless specifically indicated in ROS section below Review of Systems  Constitutional:  Negative for activity change, appetite change, chills, fatigue, fever and unexpected weight change.  HENT:  Negative for hearing loss.   Eyes:  Negative for visual disturbance.  Respiratory:  Positive for shortness of breath. Negative for cough, chest tightness and wheezing.    Cardiovascular:  Negative for chest pain, palpitations and leg swelling.  Gastrointestinal:  Positive for nausea. Negative for abdominal distention, abdominal pain, blood in stool, constipation, diarrhea and vomiting.  Genitourinary:  Negative for difficulty urinating and hematuria.  Musculoskeletal:  Negative for arthralgias, myalgias and neck pain.  Skin:  Negative for rash.  Neurological:  Positive for dizziness and headaches (morning HAs). Negative for seizures and syncope.  Hematological:  Negative for adenopathy. Does not bruise/bleed easily.  Psychiatric/Behavioral:  Negative for dysphoric mood. The patient is not nervous/anxious.     Objective:  BP 134/70   Pulse 60   Temp 98 F (36.7 C) (Oral)   Ht 5\' 9"  (1.753 m)   Wt 245 lb 4 oz (111.2 kg)   SpO2 93%   BMI 36.22 kg/m   Wt Readings from Last 3 Encounters:  10/04/23 245 lb 4 oz (111.2 kg)  09/14/23 245 lb 3.2 oz (111.2 kg)  08/12/23 246 lb (111.6 kg)      Physical Exam Vitals and nursing note reviewed.  Constitutional:      General: He is not in acute distress.    Appearance: Normal appearance. He is well-developed. He is not ill-appearing.  HENT:     Head: Normocephalic and atraumatic.     Right Ear: Hearing, tympanic membrane, ear canal and external ear normal.     Left Ear: Hearing, tympanic membrane, ear canal and external ear normal.     Nose: Nose normal.     Mouth/Throat:     Mouth: Mucous membranes are moist.     Pharynx: Oropharynx is clear. No oropharyngeal exudate or posterior oropharyngeal erythema.  Eyes:     General: No scleral icterus.    Extraocular Movements: Extraocular movements intact.     Conjunctiva/sclera: Conjunctivae normal.     Pupils: Pupils are equal, round, and reactive to light.  Neck:     Thyroid : No thyroid  mass or thyromegaly.     Vascular: No carotid bruit.  Cardiovascular:     Rate and Rhythm: Normal rate and regular rhythm.     Pulses: Normal pulses.          Radial  pulses are 2+ on the right side and 2+ on the left side.  Heart sounds: Normal heart sounds. No murmur heard. Pulmonary:     Effort: Pulmonary effort is normal. No respiratory distress.     Breath sounds: Normal breath sounds. No wheezing, rhonchi or rales.  Abdominal:     General: Bowel sounds are normal. There is no distension.     Palpations: Abdomen is soft. There is no mass.     Tenderness: There is no abdominal tenderness. There is no guarding or rebound.     Hernia: No hernia is present.  Musculoskeletal:        General: No tenderness. Normal range of motion.     Cervical back: Normal range of motion and neck supple.     Right lower leg: No edema.     Left lower leg: No edema.  Lymphadenopathy:     Cervical: No cervical adenopathy.  Skin:    General: Skin is warm and dry.     Findings: No rash (hyperpigmentation to BLE distal to shins).  Neurological:     General: No focal deficit present.     Mental Status: He is alert and oriented to person, place, and time.  Psychiatric:        Mood and Affect: Mood normal.        Behavior: Behavior normal.        Thought Content: Thought content normal.        Judgment: Judgment normal.       Results for orders placed or performed in visit on 09/29/23  POCT INR   Collection Time: 09/29/23 12:00 AM  Result Value Ref Range   INR 2.6 2.0 - 3.0   *Note: Due to a large number of results and/or encounters for the requested time period, some results have not been displayed. A complete set of results can be found in Results Review.      10/04/2023    2:44 PM 09/14/2023    2:02 PM 05/31/2023   12:11 PM 02/18/2023    2:45 PM 11/15/2022   10:42 AM  Depression screen PHQ 2/9  Decreased Interest 1 0 1 0 3  Down, Depressed, Hopeless 1 1 0 0 0  PHQ - 2 Score 2 1 1  0 3  Altered sleeping 3  0  3  Tired, decreased energy 3  0  3  Change in appetite 0  0  0  Feeling bad or failure about yourself  0  0  0  Trouble concentrating 0  0  0   Moving slowly or fidgety/restless 0  0  0  Suicidal thoughts 0  0  0  PHQ-9 Score 8  1  9   Difficult doing work/chores Somewhat difficult    Somewhat difficult       10/04/2023    2:46 PM 05/31/2023   12:11 PM 02/18/2023    2:45 PM 11/15/2022   10:42 AM  GAD 7 : Generalized Anxiety Score  Nervous, Anxious, on Edge 3 0 0 3  Control/stop worrying 1 0 0 3  Worry too much - different things 0 0 0 3  Trouble relaxing 1 0 0 2  Restless 0 0 0 0  Easily annoyed or irritable 0 0 0 0  Afraid - awful might happen 0 0 0 0  Total GAD 7 Score 5 0 0 11  Anxiety Difficulty Somewhat difficult   Somewhat difficult   Assessment & Plan:   Problem List Items Addressed This Visit     DDD (degenerative disc disease), cervical   Relevant  Medications   methocarbamol  (ROBAXIN ) 500 MG tablet   Hyperlipidemia associated with type 2 diabetes mellitus (HCC)   Relevant Medications   glimepiride  (AMARYL ) 4 MG tablet   simvastatin  (ZOCOR ) 20 MG tablet   metFORMIN  (GLUCOPHAGE ) 500 MG tablet   Semaglutide,0.25 or 0.5MG /DOS, (OZEMPIC, 0.25 OR 0.5 MG/DOSE,) 2 MG/3ML SOPN   Semaglutide,0.25 or 0.5MG /DOS, (OZEMPIC, 0.25 OR 0.5 MG/DOSE,) 2 MG/1.5ML SOPN (Start on 11/01/2023)   Type 2 diabetes mellitus with other specified complication (HCC)   Relevant Medications   glimepiride  (AMARYL ) 4 MG tablet   simvastatin  (ZOCOR ) 20 MG tablet   metFORMIN  (GLUCOPHAGE ) 500 MG tablet   Semaglutide,0.25 or 0.5MG /DOS, (OZEMPIC, 0.25 OR 0.5 MG/DOSE,) 2 MG/3ML SOPN   Semaglutide,0.25 or 0.5MG /DOS, (OZEMPIC, 0.25 OR 0.5 MG/DOSE,) 2 MG/1.5ML SOPN (Start on 11/01/2023)   Encounter for general adult medical examination with abnormal findings - Primary (Chronic)   Preventative protocols reviewed and updated unless pt declined. Discussed healthy diet and lifestyle.       Gassiness   Relevant Medications   pantoprazole  (PROTONIX ) 40 MG tablet   Chronic pain syndrome   Relevant Medications   methocarbamol  (ROBAXIN ) 500 MG tablet    COPD (chronic obstructive pulmonary disease) (HCC)   Relevant Medications   montelukast  (SINGULAIR ) 10 MG tablet     Meds ordered this encounter  Medications   glimepiride  (AMARYL ) 4 MG tablet    Sig: Take 1 tablet (4 mg total) by mouth daily with breakfast.    Dispense:  90 tablet    Refill:  4   montelukast  (SINGULAIR ) 10 MG tablet    Sig: Take 1 tablet (10 mg total) by mouth at bedtime.    Dispense:  90 tablet    Refill:  4   pantoprazole  (PROTONIX ) 40 MG tablet    Sig: Take 1 tablet (40 mg total) by mouth 2 (two) times daily before a meal.    Dispense:  180 tablet    Refill:  4   simvastatin  (ZOCOR ) 20 MG tablet    Sig: Take 1 tablet (20 mg total) by mouth every evening.    Dispense:  90 tablet    Refill:  4   methocarbamol  (ROBAXIN ) 500 MG tablet    Sig: Take 1 tablet (500 mg total) by mouth 3 (three) times daily as needed for muscle spasms.    Dispense:  30 tablet    Refill:  3   potassium chloride  (MICRO-K ) 10 MEQ CR capsule    Sig: Take 1 capsule (10 mEq total) by mouth daily.    Dispense:  110 capsule    Refill:  3    Take second dose on days you take second lasix  dose   Semaglutide,0.25 or 0.5MG /DOS, (OZEMPIC, 0.25 OR 0.5 MG/DOSE,) 2 MG/3ML SOPN    Sig: Inject 0.25 mg into the skin once a week.    Dispense:  3 mL    Refill:  0   Semaglutide,0.25 or 0.5MG /DOS, (OZEMPIC, 0.25 OR 0.5 MG/DOSE,) 2 MG/1.5ML SOPN    Sig: Inject 0.5 mg into the skin once a week.    Dispense:  1.5 mL    Refill:  6    No orders of the defined types were placed in this encounter.   Patient Instructions  Call [facility] clinic cardiology to schedule follow up appointment.  If interested, check with pharmacy about new 2 shot shingles series (shingrix).  Call me with name of eye drops for glaucoma.  Price out ozempic 0.25mg  weekly for 1 month then  increase to 0.5mg  weekly.  Continue current medicines.  Return as needed or in 6 months for follow up visit, in 3 months if ozempic  started  Follow up plan: Return in about 6 months (around 04/04/2024) for follow up visit.  Claire Crick, MD

## 2023-10-04 NOTE — Telephone Encounter (Signed)
 Pt is compliant with warfarin management and PCP apts.  Sent in refill of warfarin to requested pharmacy.

## 2023-10-04 NOTE — Assessment & Plan Note (Addendum)
 Preventative protocols reviewed and updated unless pt declined. Discussed healthy diet and lifestyle.

## 2023-10-06 DIAGNOSIS — H409 Unspecified glaucoma: Secondary | ICD-10-CM | POA: Insufficient documentation

## 2023-10-06 NOTE — Assessment & Plan Note (Signed)
Seems euvolemic today.  

## 2023-10-06 NOTE — Assessment & Plan Note (Addendum)
 Continue coumadin .  Encouraged he schedule cardiology f/u

## 2023-10-06 NOTE — Assessment & Plan Note (Signed)
 Appreciate hematology care Latest iron  infusions FeraHeme  x2 08/2023

## 2023-10-06 NOTE — Assessment & Plan Note (Addendum)
 Chronic, overall stable on metformin , farxiga , amaryl .  Discussed GLP1RA. Reviewed mechanism of action of medication as well as side effects and adverse events to watch for including nausea, diarrhea, constipation, pancreatitis. No fmhx medullary thyroid  cancer or MEN2. Discussed titration schedule for medication. Will price out ozempic.  RTC 6 mo DM f/u visit , 3 months if ozempic started

## 2023-10-06 NOTE — Assessment & Plan Note (Signed)
 Will be due for repeat later this year. Consider ordering next visit.

## 2023-10-06 NOTE — Assessment & Plan Note (Addendum)
 Chronic, stable on simvastatin  with latest LDL 97 and nonHDL 168. Discussed goal LDL likely <70 in known CAD. Would benefit from statin titration. Will start with pricing out ozempic. The ASCVD Risk score (Arnett DK, et al., 2019) failed to calculate for the following reasons:   Risk score cannot be calculated because patient has a medical history suggesting prior/existing ASCVD

## 2023-10-06 NOTE — Assessment & Plan Note (Signed)
 Chronic, stable period on brextri with duonebs - continue

## 2023-10-06 NOTE — Assessment & Plan Note (Signed)
 Continue cardiology f/u

## 2023-10-06 NOTE — Assessment & Plan Note (Signed)
 Stable period on MWF dosing

## 2023-10-06 NOTE — Assessment & Plan Note (Signed)
 Continue statin.

## 2023-10-06 NOTE — Assessment & Plan Note (Signed)
 Chronic, continue prozac  40mg  daily.

## 2023-10-06 NOTE — Assessment & Plan Note (Signed)
 H/o untreated CPAP  Continue nocturnal oxygen  supplementation

## 2023-10-06 NOTE — Assessment & Plan Note (Addendum)
 Severe, intolerant of all treatments (CPAP/BiPAP) Not inspire candidate.  Continue nocturnal oxygen .  Elevated CO2 likely from this.

## 2023-10-06 NOTE — Assessment & Plan Note (Addendum)
 Continues seeing eye doctor regularly - will let me know name of eye drops he takes.

## 2023-10-06 NOTE — Assessment & Plan Note (Signed)
 Encouraged healthy diet choices. Price out Ozempic.

## 2023-10-06 NOTE — Assessment & Plan Note (Signed)
 Restart vit D 2000 units daily

## 2023-10-06 NOTE — Assessment & Plan Note (Signed)
 Has seen [facility] - now returned to [facility] eye

## 2023-10-06 NOTE — Assessment & Plan Note (Signed)
 Chronic, stable. Continue current regimen.

## 2023-10-06 NOTE — Assessment & Plan Note (Signed)
 Previously discussed.

## 2023-10-06 NOTE — Assessment & Plan Note (Signed)
 Continue robaxin  PRN.

## 2023-10-06 NOTE — Assessment & Plan Note (Addendum)
 Followed by heme with frequent iron  infusions.  Presumed slow GI bleed - last seen by GI 10/2022 (Dr Antony Baumgartner) and he declined to undergo capsule endoscopy Latest CBC reassuring.

## 2023-10-06 NOTE — Assessment & Plan Note (Addendum)
 H/o descending colon ulcer on EGD/colonoscopy.  Presumed slow GI bleed causing symptomatic anemia - last seen by GI 10/2022 (Dr Antony Baumgartner) and he declined to undergo capsule endoscopy Continue pantoprazole  daily.

## 2023-10-07 ENCOUNTER — Ambulatory Visit: Payer: Self-pay | Admitting: Family Medicine

## 2023-10-19 ENCOUNTER — Encounter: Payer: Self-pay | Admitting: Family Medicine

## 2023-10-19 ENCOUNTER — Ambulatory Visit (INDEPENDENT_AMBULATORY_CARE_PROVIDER_SITE_OTHER): Admitting: Family Medicine

## 2023-10-19 VITALS — BP 174/82 | HR 51 | Temp 98.0°F | Ht 69.0 in | Wt 249.0 lb

## 2023-10-19 DIAGNOSIS — I10 Essential (primary) hypertension: Secondary | ICD-10-CM

## 2023-10-19 DIAGNOSIS — R519 Headache, unspecified: Secondary | ICD-10-CM | POA: Diagnosis not present

## 2023-10-19 LAB — TSH: TSH: 2.08 u[IU]/mL (ref 0.35–5.50)

## 2023-10-19 LAB — SEDIMENTATION RATE: Sed Rate: 19 mm/h (ref 0–20)

## 2023-10-19 LAB — C-REACTIVE PROTEIN: CRP: 1.3 mg/dL (ref 0.5–20.0)

## 2023-10-19 NOTE — Progress Notes (Signed)
 Ph: [phone] Fax: [phone]   Patient ID: [name], male    DOB: 1955-09-17, 68 y.o.   MRN: [id number]  This visit was conducted in person.  BP (!) 174/82   Pulse (!) 51   Temp 98 F (36.7 C) (Oral)   Ht 5' 9 (1.753 m)   Wt 249 lb (112.9 kg)   SpO2 94%   BMI 36.77 kg/m   BP Readings from Last 3 Encounters:  10/19/23 (!) 174/82  10/04/23 134/70  09/14/23 110/82    Vision Screening   Right eye Left eye Both eyes  Without correction 20/50 20/70 2040  With correction      CC: facial pain  Subjective:   HPI: [name] is a 68 y.o. male presenting on 10/19/2023 for Facial Pain (C/o R-side facial pain- disturbs sleep and hurts to put teeth in. Started 10/17/23. Also, c/o SOB. )   2d h/o severe R facial pain sharp stabbing pain behind R eye that travels to top of head and ear and into shoulder/neck. Pain can keep him up at night. 9/10 pain. Attacks lasts 5-30 seconds at a time, but occurs throughout the day. Can have up to 1 hour between attacks, but occurring frequently throughout the day. Blurry vision when pain comes on. Has full dentures - painful to wear dentures. Pain feels like it starts under R eye.   Denies inciting trauma/injury or fall  No eye tearing, no red eye, no runny nose or watery eye.  No fevers/chills.  No photophobia. No floaters or flashing lights.  H/o L eye glaucoma.   Advil 400mg  didn't help Flonase  didn't help  Robaxin  didn't help. Some relief after smoking MJ and falling asleep     Relevant past medical, surgical, family and social history reviewed and updated as indicated. Interim medical history since our last visit reviewed. Allergies and medications reviewed and updated. Outpatient Medications Prior to Visit  Medication Sig Dispense Refill   ACCU-CHEK FASTCLIX LANCETS MISC Check blood sugar once daily and as instructed. Dx 250.00 100 each 3   acetaminophen  (TYLENOL ) 500 MG tablet Take 2 tablets (1,000 mg total) by  mouth every 6 (six) hours as needed for mild pain.     albuterol  (VENTOLIN  HFA) 108 (90 Base) MCG/ACT inhaler Inhale 2 puffs into the lungs every 6 (six) hours as needed for wheezing or shortness of breath. 8.5 each 6   amiodarone  (PACERONE ) 200 MG tablet Take 1 tablet (200 mg total) by mouth daily. 90 tablet 3   Blood Glucose Monitoring Suppl (BLOOD GLUCOSE MONITOR SYSTEM) W/DEVICE KIT by Does not apply route. Use to check sugar once daily and as needed Dx: E11.9 **ONE TOUCH VERIO**     Budeson-Glycopyrrol-Formoterol  (BREZTRI  AEROSPHERE) 160-9-4.8 MCG/ACT AERO Inhale 2 puffs into the lungs 2 (two) times daily. 32.1 g 11   Cholecalciferol  (VITAMIN D ) 50 MCG (2000 UT) CAPS Take 1 capsule (2,000 Units total) by mouth daily. 30 capsule    dapagliflozin  propanediol (FARXIGA ) 10 MG TABS tablet Take 1 tablet (10 mg total) by mouth daily before breakfast. 90 tablet 3   Docusate Calcium (STOOL SOFTENER PO) Take 1 tablet by mouth daily.     FLUoxetine  (PROZAC ) 40 MG capsule TAKE 1 CAPSULE (40 MG TOTAL) BY MOUTH DAILY. 90 capsule 3   fluticasone  (FLONASE ) 50 MCG/ACT nasal spray Place 2 sprays into both nostrils daily. (Patient taking differently: Place 2 sprays into both nostrils as needed.) 48 mL 3   furosemide  (LASIX ) 80  MG tablet Take 1 tablet (80 mg total) by mouth daily. With second dose as needed for fluid retention, leg swelling 120 tablet 3   glimepiride  (AMARYL ) 4 MG tablet Take 1 tablet (4 mg total) by mouth daily with breakfast. 90 tablet 4   ipratropium-albuterol  (DUONEB) 0.5-2.5 (3) MG/3ML SOLN TAKE 3 MLS BY NEBULIZATION EVERY 6 (SIX) HOURS AS NEEDED (SEVERE SHORTNESS OF BREATH/WHEEZING). 360 mL 0   metFORMIN  (GLUCOPHAGE ) 500 MG tablet Take 1 tablet (500 mg total) by mouth daily with breakfast.     methocarbamol  (ROBAXIN ) 500 MG tablet Take 1 tablet (500 mg total) by mouth 3 (three) times daily as needed for muscle spasms. 30 tablet 3   metolazone  (ZAROXOLYN ) 2.5 MG tablet Take 1 tablet (2.5 mg  total) by mouth once a week. As needed     montelukast  (SINGULAIR ) 10 MG tablet Take 1 tablet (10 mg total) by mouth at bedtime. 90 tablet 4   ONETOUCH VERIO test strip CHECK BLOOD SUGAR 3 TIMES A DAY 300 strip 3   OXYGEN  Inhale 2 L into the lungs at bedtime.     pantoprazole  (PROTONIX ) 40 MG tablet Take 1 tablet (40 mg total) by mouth 2 (two) times daily before a meal. 180 tablet 4   potassium chloride  (MICRO-K ) 10 MEQ CR capsule Take 1 capsule (10 mEq total) by mouth daily. 110 capsule 3   [START ON 11/01/2023] Semaglutide ,0.25 or 0.5MG /DOS, (OZEMPIC , 0.25 OR 0.5 MG/DOSE,) 2 MG/1.5ML SOPN Inject 0.5 mg into the skin once a week. 1.5 mL 6   Semaglutide ,0.25 or 0.5MG /DOS, (OZEMPIC , 0.25 OR 0.5 MG/DOSE,) 2 MG/3ML SOPN Inject 0.25 mg into the skin once a week. 3 mL 0   simvastatin  (ZOCOR ) 20 MG tablet Take 1 tablet (20 mg total) by mouth every evening. 90 tablet 4   triamcinolone  cream (KENALOG ) 0.1 % Apply 1 Application topically 2 (two) times daily. For no more than 10 days at a time 45 g 0   vitamin B-12 (CYANOCOBALAMIN ) 1000 MCG tablet Take 1 tablet (1,000 mcg total) by mouth every Monday, Wednesday, and Friday.     warfarin (COUMADIN ) 5 MG tablet TAKE 1 TABLET BY MOUTH DAILY EXCEPT TAKE 1/2 TABLET ON MONDAY, WEDNESDAY AND SATURDAY OR AS DIRECTED BY ANTICOAGULATION CLINIC 105 tablet 1   No facility-administered medications prior to visit.     Per HPI unless specifically indicated in ROS section below Review of Systems  Objective:  BP (!) 174/82   Pulse (!) 51   Temp 98 F (36.7 C) (Oral)   Ht 5' 9 (1.753 m)   Wt 249 lb (112.9 kg)   SpO2 94%   BMI 36.77 kg/m   Wt Readings from Last 3 Encounters:  10/19/23 249 lb (112.9 kg)  10/04/23 245 lb 4 oz (111.2 kg)  09/14/23 245 lb 3.2 oz (111.2 kg)      Physical Exam Vitals and nursing note reviewed.  Constitutional:      Appearance: Normal appearance. He is not ill-appearing.  HENT:     Head: Normocephalic and atraumatic.     Right  Ear: Tympanic membrane, ear canal and external ear normal.     Left Ear: Tympanic membrane, ear canal and external ear normal.     Nose: Nose normal. No congestion or rhinorrhea.     Mouth/Throat:     Mouth: Mucous membranes are moist.     Pharynx: Oropharynx is clear. No oropharyngeal exudate or posterior oropharyngeal erythema.   Eyes:     General:  Right eye: No discharge.        Left eye: No discharge.     Extraocular Movements: Extraocular movements intact.     Conjunctiva/sclera: Conjunctivae normal.     Pupils: Pupils are equal, round, and reactive to light.     Comments:  R eye photophobia No conjunctival erythema   Skin:    General: Skin is warm and dry.     Findings: No rash.   Neurological:     General: No focal deficit present.     Mental Status: He is alert.     Cranial Nerves: Cranial nerves 2-12 are intact.     Sensory: Sensation is intact.     Motor: Motor function is intact.     Coordination: Coordination is intact. Romberg sign negative. Coordination normal.     Gait: Gait is intact.     Comments:  CN 2-12 intact FTN intact EOMI        Lab Results  Component Value Date   NA 139 09/14/2023   CL 98 09/14/2023   K 5.0 09/14/2023   CO2 36 (H) 09/14/2023   BUN 17 09/14/2023   CREATININE 1.28 09/14/2023   GFR 57.71 (L) 09/14/2023   CALCIUM 9.3 09/14/2023   PHOS 3.0 08/04/2021   ALBUMIN 4.2 09/14/2023   GLUCOSE 143 (H) 09/14/2023    Lab Results  Component Value Date   TSH 2.190 02/11/2023    Lab Results  Component Value Date   HGBA1C 7.4 (H) 09/14/2023    Assessment & Plan:   Problem List Items Addressed This Visit     Hypertension   BP elevation noted today - attributed to pain as previously well controlled. No med changes made today.       Right-sided headache - Primary   2d h/o significant sharp R periorbital pain with radiation to jaw, temple, R side of head and into neck, associated with vision changes but without significant  autonomic symptoms. Ddx includes temporal arteritis, trigeminal autonomic cephalalgias such as cluster headache, sinus related, glaucoma related, other.  Given vision changes, do recommend expedited eye exam with eye doctor - he will call [facility] and let me know if needs urgent referral to be seen.  Need to rule out TA - check inflammatory markers today. Discussed high dose prednisone  + temporal artery biopsy if abnormal.  Seemed to respond to high flow oxygen  via non-rebreather - suggestive of cluster headache however duration of attacks not consistent with this.       Relevant Orders   Sedimentation rate   C-reactive protein   TSH     No orders of the defined types were placed in this encounter.   Orders Placed This Encounter  Procedures   Sedimentation rate   C-reactive protein   TSH    Patient Instructions  Vision screen today - some impairment noted - I do recommend repeat eye exam for evaluation of this right eye pain with [facility].  Labs today to evaluate for temporal arteritis - if inflammatory test is elevated, would refer you to vascular surgeon and start high dose steroid treatment.  Oxygen  treatment today helped - possible cluster headache.  Will await lab results then discuss treatment options which may include high dose prednisone .   Follow up plan: Return if symptoms worsen or fail to improve.  Anton Blas, MD

## 2023-10-19 NOTE — Patient Instructions (Addendum)
 Vision screen today - some impairment noted - I do recommend repeat eye exam for evaluation of this right eye pain with [facility].  Labs today to evaluate for temporal arteritis - if inflammatory test is elevated, would refer you to vascular surgeon and start high dose steroid treatment.  Oxygen  treatment today helped - possible cluster headache.  Will await lab results then discuss treatment options which may include high dose prednisone .

## 2023-10-19 NOTE — Assessment & Plan Note (Signed)
 2d h/o significant sharp R periorbital pain with radiation to jaw, temple, R side of head and into neck, associated with vision changes but without significant autonomic symptoms. Ddx includes temporal arteritis, trigeminal autonomic cephalalgias such as cluster headache, sinus related, glaucoma related, other.  Given vision changes, do recommend expedited eye exam with eye doctor - he will call [facility] and let me know if needs urgent referral to be seen.  Need to rule out TA - check inflammatory markers today. Discussed high dose prednisone  + temporal artery biopsy if abnormal.  Seemed to respond to high flow oxygen  via non-rebreather - suggestive of cluster headache however duration of attacks not consistent with this.

## 2023-10-19 NOTE — Assessment & Plan Note (Signed)
 BP elevation noted today - attributed to pain as previously well controlled. No med changes made today.

## 2023-10-20 ENCOUNTER — Ambulatory Visit: Payer: Self-pay | Admitting: Family Medicine

## 2023-10-21 NOTE — Telephone Encounter (Signed)
 Patient returning call re: lab results, please call the patient back

## 2023-10-21 NOTE — Telephone Encounter (Signed)
 Copied from CRM [id number]. Topic: Clinical - Lab/Test Results >> Oct 21, 2023  1:08 PM Viola F wrote: Reason for CRM: Patient returned Felicia's phone call regarding results, he would like to speak with her directly

## 2023-10-24 DIAGNOSIS — Z79899 Other long term (current) drug therapy: Secondary | ICD-10-CM | POA: Diagnosis not present

## 2023-10-24 DIAGNOSIS — I5032 Chronic diastolic (congestive) heart failure: Secondary | ICD-10-CM | POA: Diagnosis not present

## 2023-10-24 DIAGNOSIS — E782 Mixed hyperlipidemia: Secondary | ICD-10-CM | POA: Diagnosis not present

## 2023-10-24 DIAGNOSIS — I2510 Atherosclerotic heart disease of native coronary artery without angina pectoris: Secondary | ICD-10-CM | POA: Diagnosis not present

## 2023-10-24 DIAGNOSIS — I480 Paroxysmal atrial fibrillation: Secondary | ICD-10-CM | POA: Diagnosis not present

## 2023-10-24 DIAGNOSIS — E119 Type 2 diabetes mellitus without complications: Secondary | ICD-10-CM | POA: Diagnosis not present

## 2023-10-25 ENCOUNTER — Ambulatory Visit: Payer: Self-pay | Admitting: Family Medicine

## 2023-10-25 ENCOUNTER — Ambulatory Visit (INDEPENDENT_AMBULATORY_CARE_PROVIDER_SITE_OTHER)

## 2023-10-25 ENCOUNTER — Ambulatory Visit (INDEPENDENT_AMBULATORY_CARE_PROVIDER_SITE_OTHER): Admitting: Family Medicine

## 2023-10-25 ENCOUNTER — Encounter: Payer: Self-pay | Admitting: Family Medicine

## 2023-10-25 ENCOUNTER — Ambulatory Visit
Admission: RE | Admit: 2023-10-25 | Discharge: 2023-10-25 | Disposition: A | Discharge status: Home / Self Care | Source: Ambulatory Visit | Attending: Family Medicine | Admitting: Family Medicine

## 2023-10-25 VITALS — BP 154/84 | HR 57 | Temp 97.9°F | Ht 69.0 in | Wt 242.0 lb

## 2023-10-25 DIAGNOSIS — R519 Headache, unspecified: Secondary | ICD-10-CM

## 2023-10-25 DIAGNOSIS — I672 Cerebral atherosclerosis: Secondary | ICD-10-CM | POA: Diagnosis not present

## 2023-10-25 DIAGNOSIS — I6782 Cerebral ischemia: Secondary | ICD-10-CM | POA: Diagnosis not present

## 2023-10-25 DIAGNOSIS — Z7901 Long term (current) use of anticoagulants: Secondary | ICD-10-CM | POA: Diagnosis not present

## 2023-10-25 DIAGNOSIS — I10 Essential (primary) hypertension: Secondary | ICD-10-CM

## 2023-10-25 LAB — POCT INR: INR: 2.5 (ref 2.0–3.0)
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
## 2023-10-25 NOTE — Patient Instructions (Addendum)
 Coumadin  check today  Go to [facility] outpatient imaging center now for head CT today.  [address] [place], [place] [address]

## 2023-10-25 NOTE — Patient Instructions (Addendum)
 Pre visit review using our clinic review tool, if applicable. No additional management support is needed unless otherwise documented below in the visit note.  Continue 1 tablet daily except take 1/2 tablet on Monday, Wednesday and Saturday.  Recheck in 4 weeks.

## 2023-10-25 NOTE — Assessment & Plan Note (Addendum)
 Ongoing pain, acutely worse since last night but present for over a week, notes R hearing affected. Headache comes and goes with fluctuating intensity.  He notes he's been treating HA with ibuprofen or excedrin - advised not take these meds as he's on coumadin .  Placed on O2 supplement at 2L via [place] with mild improvement.  Will order stat head CT in coumadin  use and new onset headache of high severity.  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 No red eye to suggest glaucoma or other acute eye pathology.

## 2023-10-25 NOTE — Progress Notes (Signed)
 Pt also had apt with PCP today and lab staff performed a POCT INR and relayed the result to this nurse.  Continue 1 tablet daily except take 1/2 tablet on Monday, Wednesday and Saturday.  Recheck in 4 weeks.   LVM with dosing instructions and to return call to schedule next coumadin  clinic apt.

## 2023-10-25 NOTE — Assessment & Plan Note (Signed)
 INR checked today = 2.5. Will update coumadin  clinic, no change in med dosing at this time.

## 2023-10-25 NOTE — Assessment & Plan Note (Signed)
 Again attributed to acute pain - as BP at cardiology office yesterday was 124/82.  No med change indicated at this time.

## 2023-10-27 ENCOUNTER — Ambulatory Visit

## 2023-11-01 DIAGNOSIS — H52223 Regular astigmatism, bilateral: Secondary | ICD-10-CM | POA: Diagnosis not present

## 2023-11-01 DIAGNOSIS — H401134 Primary open-angle glaucoma, bilateral, indeterminate stage: Secondary | ICD-10-CM | POA: Diagnosis not present

## 2023-11-01 DIAGNOSIS — E119 Type 2 diabetes mellitus without complications: Secondary | ICD-10-CM | POA: Diagnosis not present

## 2023-11-01 DIAGNOSIS — H524 Presbyopia: Secondary | ICD-10-CM | POA: Diagnosis not present

## 2023-11-01 DIAGNOSIS — H2513 Age-related nuclear cataract, bilateral: Secondary | ICD-10-CM | POA: Diagnosis not present

## 2023-11-01 DIAGNOSIS — H5203 Hypermetropia, bilateral: Secondary | ICD-10-CM | POA: Diagnosis not present

## 2023-11-01 LAB — HM DIABETES EYE EXAM

## 2023-11-09 ENCOUNTER — Inpatient Hospital Stay: Attending: Oncology

## 2023-11-09 DIAGNOSIS — D508 Other iron deficiency anemias: Secondary | ICD-10-CM

## 2023-11-09 DIAGNOSIS — R768 Other specified abnormal immunological findings in serum: Secondary | ICD-10-CM | POA: Insufficient documentation

## 2023-11-09 DIAGNOSIS — D509 Iron deficiency anemia, unspecified: Secondary | ICD-10-CM | POA: Diagnosis not present

## 2023-11-09 LAB — CBC (CANCER CENTER ONLY)
HCT: 38.8 % — ABNORMAL LOW (ref 39.0–52.0)
Hemoglobin: 12.7 g/dL — ABNORMAL LOW (ref 13.0–17.0)
MCH: 29 pg (ref 26.0–34.0)
MCHC: 32.7 g/dL (ref 30.0–36.0)
MCV: 88.6 fL (ref 80.0–100.0)
Platelet Count: 199 K/uL (ref 150–400)
RBC: 4.38 MIL/uL (ref 4.22–5.81)
RDW: 16.8 % — ABNORMAL HIGH (ref 11.5–15.5)
WBC Count: 9.7 K/uL (ref 4.0–10.5)
nRBC: 0 % (ref 0.0–0.2)

## 2023-11-09 LAB — IRON AND TIBC
Iron: 55 ug/dL (ref 45–182)
Saturation Ratios: 16 % — ABNORMAL LOW (ref 17.9–39.5)
TIBC: 343 ug/dL (ref 250–450)
UIBC: 288 ug/dL

## 2023-11-09 LAB — FERRITIN: Ferritin: 18 ng/mL — ABNORMAL LOW (ref 24–336)

## 2023-11-10 ENCOUNTER — Ambulatory Visit: Payer: Self-pay

## 2023-11-10 NOTE — Telephone Encounter (Signed)
 Per Dr. Melanee Does he want more iv iron ? His anemia is much better but iron  levels are still low.  Outbound call; left detailed voice message with information above.  Will attempt contacting again shortly.

## 2023-11-10 NOTE — Telephone Encounter (Signed)
-----   Message from Annah JAYSON Skene sent at 11/10/2023  3:04 AM EDT ----- Regarding: FW: Does he want more iv iron ? His anemia is much better but iron  levels are still low ----- Message ----- From: Interface, Lab In [place] Sent: 11/09/2023   1:23 PM EDT To: Annah JAYSON Skene, MD

## 2023-11-11 ENCOUNTER — Telehealth: Payer: Self-pay | Admitting: Oncology

## 2023-11-11 ENCOUNTER — Encounter: Payer: Self-pay | Admitting: Oncology

## 2023-11-11 NOTE — Telephone Encounter (Signed)
 Dr. Melanee please see Barbara's message below.  Can you please put in iron  orders for patient.  Per [place] patient is authorized for fereheme x2. That is what he always gets and what I scheduled last time in may. I'll forward back to scheduling to coordinate with patient upon receipt. Thank you Dr. Melanee.

## 2023-11-11 NOTE — Telephone Encounter (Signed)
 Patient called to schedule appointments- message sent to team to verify what to schedule- Nurse to send MD a message to verify what he needs and how many iron  infusion- Patient informed someone will call him back with appointments

## 2023-11-11 NOTE — Telephone Encounter (Signed)
 Outbound call; detailed voice message left again.  My chart message also sent to patient.

## 2023-11-13 ENCOUNTER — Other Ambulatory Visit: Payer: Self-pay | Admitting: Family Medicine

## 2023-11-13 DIAGNOSIS — E1169 Type 2 diabetes mellitus with other specified complication: Secondary | ICD-10-CM

## 2023-11-14 ENCOUNTER — Telehealth: Payer: Self-pay | Admitting: *Deleted

## 2023-11-14 ENCOUNTER — Other Ambulatory Visit: Payer: Self-pay | Admitting: Hospice and Palliative Medicine

## 2023-11-14 NOTE — Telephone Encounter (Signed)
 Called and said someone to talk to them about getting an iron  infusion and he would like to know what date and time.  Dr. Melanee was out of country on vacation and I got Josh to put in the orders for the Feraheme  and the patient is going to be coming on July 24 at 1 PM and then the next week he will be here on July 31 at 130.  He wants me to put it in my chart so I will send it now.

## 2023-11-14 NOTE — Telephone Encounter (Signed)
 Patient has been scheduled for feraheme  x 2.  No further follow up needed.

## 2023-11-16 ENCOUNTER — Encounter: Payer: Self-pay | Admitting: Family Medicine

## 2023-11-16 ENCOUNTER — Telehealth: Payer: Self-pay

## 2023-11-16 ENCOUNTER — Ambulatory Visit: Admitting: Family Medicine

## 2023-11-16 VITALS — BP 118/74 | HR 64 | Temp 98.2°F | Ht 69.0 in | Wt 241.0 lb

## 2023-11-16 DIAGNOSIS — D500 Iron deficiency anemia secondary to blood loss (chronic): Secondary | ICD-10-CM

## 2023-11-16 DIAGNOSIS — E1169 Type 2 diabetes mellitus with other specified complication: Secondary | ICD-10-CM

## 2023-11-16 DIAGNOSIS — R519 Headache, unspecified: Secondary | ICD-10-CM | POA: Diagnosis not present

## 2023-11-16 DIAGNOSIS — Z7984 Long term (current) use of oral hypoglycemic drugs: Secondary | ICD-10-CM | POA: Diagnosis not present

## 2023-11-16 DIAGNOSIS — R0981 Nasal congestion: Secondary | ICD-10-CM

## 2023-11-16 MED ORDER — AMOXICILLIN-POT CLAVULANATE 875-125 MG PO TABS: 1.0000 | ORAL_TABLET | Freq: Two times a day (BID) | ORAL | 0 refills | Status: AC

## 2023-11-16 MED ORDER — METOLAZONE 2.5 MG PO TABS: 2.5000 mg | ORAL_TABLET | ORAL | 0 refills | Status: DC

## 2023-11-16 NOTE — Assessment & Plan Note (Deleted)
 Appreciate heme care - pending rpt iron  infusions Feraheme  x2.

## 2023-11-16 NOTE — Assessment & Plan Note (Signed)
 Appreciate heme care - pending rpt iron  infusions Feraheme  x2.

## 2023-11-16 NOTE — Telephone Encounter (Signed)
 Message Received: Today Rilla Baller, MD  Ethyl Kirsch, RN Fyi starting patient on 10d augmentin  course. I believe his next coumadin  clinic appt is 11/23/2023. Lab Results      Component                Value               Date                      INR                      2.5                 10/25/2023                INR                      2.6                 09/29/2023                INR                      2.2                 09/01/2023    Augmentin  has potential to interact with warfarin and increase INR. The majority of pts do not see an interaction with Augmentin . Will check INR next week, and advised to watch for signs and symptoms of bleeding.  LVM to return call.

## 2023-11-16 NOTE — Assessment & Plan Note (Addendum)
 Chronic sinus congestion for years - notes he chronically takes 2 tablets of OTC allergy plus sinus headache medication which contains acetaminophen , diphenhydramine and phenylephrine . ?MOH component.  Rec stop decongestant and only take antihistamine, continue singulair  and flonase .

## 2023-11-16 NOTE — Assessment & Plan Note (Addendum)
 Ongoing pain, now seems to be localizing to R maxillary sinus. Previous head CT reassuring, didn't find any specific paranasal sinus disease. Given duration and progression, will cover for possible sinusitis component with augmentin  10d course. Will also refer to neurology for ongoing R sided headache. Discussed if abx course resolves headache, ok to cancel neurology appointment.  S/p reassuring eye exam at [facility] eye - no cause found in eye.  Will notify coumadin  clinic about augmentin  abx course.

## 2023-11-16 NOTE — Assessment & Plan Note (Addendum)
 Continues metformin , farxiga , amaryl . Ozempic  was unaffordable.  Refer to pharmacy to check on eligiblity for Ozempic  PAP
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
## 2023-11-16 NOTE — Patient Instructions (Addendum)
 Possible sinusitis contributing - start augmentin  10 day course. I will also refer you to [facility] neurology for ongoing right sided headache.  If antibiotics resolves headache, ok to cancel neurology evaluation.  I recommend avoiding decongestants (ie phenylephrine  in your allergy sinus pill). Take plain loratadine or claritin 10mg  in its place.   I will let coumadin  clinic know we're starting antibiotic.

## 2023-11-17 ENCOUNTER — Inpatient Hospital Stay

## 2023-11-17 ENCOUNTER — Telehealth: Payer: Self-pay

## 2023-11-17 VITALS — BP 128/80 | HR 60 | Temp 97.1°F

## 2023-11-17 DIAGNOSIS — D508 Other iron deficiency anemias: Secondary | ICD-10-CM

## 2023-11-17 DIAGNOSIS — D509 Iron deficiency anemia, unspecified: Secondary | ICD-10-CM | POA: Diagnosis not present

## 2023-11-17 DIAGNOSIS — R768 Other specified abnormal immunological findings in serum: Secondary | ICD-10-CM | POA: Diagnosis not present

## 2023-11-17 MED ORDER — SODIUM CHLORIDE 0.9 % IV SOLN
INTRAVENOUS | Status: DC
  Filled 2023-11-17: qty 250

## 2023-11-17 MED ORDER — SODIUM CHLORIDE 0.9 % IV SOLN
510.0000 mg | INTRAVENOUS | Status: DC
  Administered 2023-11-17: 510 mg via INTRAVENOUS
  Filled 2023-11-17: qty 510

## 2023-11-17 NOTE — Telephone Encounter (Addendum)
 Pt reports he could not pick up the medication because he thought the pharmacy would take his Ucard but they will not accept those cards. Currently he does not have the amount needed for the abx, $5. He reports he is not sure when he will have the money but will pick up abx as soon as he does.  Advised to start abx as soon as possible and keep apt on 7/31 for INR check in coumadin  clinic. Advised to watch for s/s of abnormal bruising or bleeding. Advised if any s/s to go to ER. Pt verbalized understanding.

## 2023-11-17 NOTE — Progress Notes (Signed)
Refused post observation. Aware of risks. Vitals stable at discharge.

## 2023-11-17 NOTE — Telephone Encounter (Signed)
 Pt LVM he has not started abx yet and is picking it up today.  LVM

## 2023-11-17 NOTE — Patient Instructions (Signed)

## 2023-11-18 NOTE — Progress Notes (Signed)
 Complex Care Management Note Care Guide Note  11/18/2023 Name: [name] MRN: [id number] DOB: 02-23-1956   Complex Care Management Outreach Attempts: A second unsuccessful outreach was attempted today to offer the patient with information about available complex care management services.  Follow Up Plan:  Additional outreach attempts will be made to offer the patient complex care management information and services.   Encounter Outcome:  No Answer  Leotis Rase [facility], [facility] Guide  Direct Dial: [phone]  Fax [phone]

## 2023-11-21 ENCOUNTER — Telehealth: Payer: Self-pay

## 2023-11-21 NOTE — Progress Notes (Signed)
 Care Guide Pharmacy Note  11/21/2023 Name: [name] MRN: [id number] DOB: [birth year]  Referred By: Rilla Baller, MD Reason for referral: Complex Care Management (Initial Outreach scheduled with PHARM D-[name])   [name] Haddon is a 68 y.o. year old male who is a primary care patient of Rilla Baller, MD.  [name] Virgil was referred to the pharmacist for assistance related to: DMII  Successful contact was made with the patient to discuss pharmacy services including being ready for the pharmacist to call at least 5 minutes before the scheduled appointment time and to have medication bottles and any blood pressure readings ready for review. The patient agreed to meet with the pharmacist via telephone visit on (date/time). 12/01/23 @ 9 AM.  Leotis Cl[facility]  [facility], [facility] Guide  Direct Dial: [phone]  Fax [phone]

## 2023-11-24 ENCOUNTER — Inpatient Hospital Stay

## 2023-11-24 ENCOUNTER — Ambulatory Visit (INDEPENDENT_AMBULATORY_CARE_PROVIDER_SITE_OTHER)

## 2023-11-24 VITALS — BP 114/70 | HR 59 | Temp 99.1°F | Resp 18

## 2023-11-24 DIAGNOSIS — Z7901 Long term (current) use of anticoagulants: Secondary | ICD-10-CM

## 2023-11-24 DIAGNOSIS — D508 Other iron deficiency anemias: Secondary | ICD-10-CM

## 2023-11-24 DIAGNOSIS — R768 Other specified abnormal immunological findings in serum: Secondary | ICD-10-CM | POA: Diagnosis not present

## 2023-11-24 DIAGNOSIS — D509 Iron deficiency anemia, unspecified: Secondary | ICD-10-CM | POA: Diagnosis not present

## 2023-11-24 LAB — POCT INR: INR: 1.7 — AB (ref 2.0–3.0)

## 2023-11-24 MED ORDER — SODIUM CHLORIDE 0.9 % IV SOLN
510.0000 mg | INTRAVENOUS | Status: DC
  Administered 2023-11-24: 510 mg via INTRAVENOUS
  Filled 2023-11-24: qty 510

## 2023-11-24 MED ORDER — SODIUM CHLORIDE 0.9 % IV SOLN
INTRAVENOUS | Status: DC
  Filled 2023-11-24: qty 250

## 2023-11-24 NOTE — Progress Notes (Cosign Needed Addendum)
 Pt was prescribed Augmentin  on 7/24 for sinusitis, but was unable to pick it up at that time due to cost. Pt started abx yesterday. Pt reports constipation x 3 days, so he is not eating well. Is not sure if he could have missed a dose of warfarin.  Increase dose today to take 1 1/2 tablets and then continue 1 tablet daily except take 1/2 tablet on Monday, Wednesday and Saturday.  Recheck in 3 weeks.

## 2023-11-24 NOTE — Patient Instructions (Addendum)
 Pre visit review using our clinic review tool, if applicable. No additional management support is needed unless otherwise documented below in the visit note.  Increase dose today to take 1 1/2 tablets and then continue 1 tablet daily except take 1/2 tablet on Monday, Wednesday and Saturday.  Recheck in 3 weeks.

## 2023-11-29 ENCOUNTER — Other Ambulatory Visit: Payer: Self-pay

## 2023-11-29 MED ORDER — LANCET DEVICE MISC: 1.0000 | Freq: Three times a day (TID) | 0 refills | Status: AC

## 2023-11-29 MED ORDER — BLOOD GLUCOSE MONITORING SUPPL DEVI: 1.0000 | Freq: Three times a day (TID) | 0 refills | Status: AC

## 2023-11-29 MED ORDER — BLOOD GLUCOSE TEST VI STRP: 1.0000 | ORAL_STRIP | Freq: Three times a day (TID) | 0 refills | Status: DC

## 2023-11-29 MED ORDER — LANCETS MISC. MISC: 1.0000 | Freq: Three times a day (TID) | 0 refills | Status: AC

## 2023-12-01 ENCOUNTER — Encounter: Payer: Self-pay | Admitting: Pharmacist

## 2023-12-01 ENCOUNTER — Other Ambulatory Visit

## 2023-12-01 NOTE — Progress Notes (Deleted)
   12/01/2023 Name: [name] MRN: [id number] DOB: [birth year]  Subjective  No chief complaint on file.   Care Team: Primary Care Provider: Rilla Baller, MD  Reason for visit: ?  [name] is a 68 y.o. male who presents today for a telephone visit with the pharmacist due to medication access concerns regarding Ozempic . ?   Medication Access: ?   Prescription drug coverage: YES Payor: [employer] MEDICARE / Plan: [facility] MEDICARE / Product Type: *No Product type* /   Current Patient Assistance: [employer] (AZ&Me)  Assessment and Plan:   1. Medication Access Patient current enrolled in PAP through AZ&Me and therefore is likely eligible for PAP to receive Ozempic .      Future Appointments  Date Time Provider Department Center  12/01/2023  9:00 AM [facility] [place] PHARMACIST [facility]  12/15/2023 10:30 AM [facility] COUMADIN  CLINIC [facility]  12/19/2023  3:00 PM Rilla Baller, MD [facility]  02/17/2024  1:00 PM [facility] None  02/17/2024  1:15 PM Melanee Annah BROCKS, MD [facility] None  09/14/2024  2:20 PM [facility] ANNUAL WELLNESS VISIT 1 [facility]    Manuelita FABIENE Kobs, PharmD Clinical Pharmacist [facility] [phone]

## 2023-12-01 NOTE — Progress Notes (Signed)
 Patient Assistance Program (PAP) Application   Manufacturer: Novo Nordisk    (New enrollment) Medication(s): Ozempic   Patient Portion of Application:  12/01/23: Completed via online enrollment tool.  Income Documentation: N/A - Electronic verification elected.  Provider Portion of Application:  12/01/23: Provider portion completed by PharmD and uploaded PCP eFax folder for signature.  Prescription(s): Included in MAP application. Left blank for PCP to complete.   Forwarded to [facility] CPhT Patient Advocate Team for future correspondences/re-enrollment.  Note routed to PCP Clinic Pool to ensure PCP signature is obtained and application is faxed.  *[facility] clinic team - Please Addend/update this note as the Next Steps are completed in office*

## 2023-12-01 NOTE — Progress Notes (Signed)
Attempted to contact patient for scheduled appointment for medication management. Left HIPAA compliant message for patient to return my call at their convenience.   

## 2023-12-02 ENCOUNTER — Telehealth: Payer: Self-pay | Admitting: Family Medicine

## 2023-12-02 NOTE — Telephone Encounter (Signed)
 Copied from CRM #[id number]. Topic: Appointments - Scheduling Inquiry for Clinic >> Dec 01, 2023  1:42 PM Harlene ORN wrote: Reason for CRM: Patient called about missing his telephone Pharmacist appointment. Is returning call.

## 2023-12-02 NOTE — Progress Notes (Signed)
Signed and in CMA box.  

## 2023-12-07 ENCOUNTER — Ambulatory Visit: Payer: Self-pay

## 2023-12-07 ENCOUNTER — Ambulatory Visit (INDEPENDENT_AMBULATORY_CARE_PROVIDER_SITE_OTHER): Admitting: Family Medicine

## 2023-12-07 ENCOUNTER — Encounter: Payer: Self-pay | Admitting: Family Medicine

## 2023-12-07 VITALS — BP 140/82 | HR 67 | Temp 98.4°F | Ht 69.0 in | Wt 247.1 lb

## 2023-12-07 DIAGNOSIS — R519 Headache, unspecified: Secondary | ICD-10-CM

## 2023-12-07 DIAGNOSIS — M545 Low back pain, unspecified: Secondary | ICD-10-CM | POA: Insufficient documentation

## 2023-12-07 DIAGNOSIS — Z87891 Personal history of nicotine dependence: Secondary | ICD-10-CM | POA: Diagnosis not present

## 2023-12-07 DIAGNOSIS — J432 Centrilobular emphysema: Secondary | ICD-10-CM | POA: Diagnosis not present

## 2023-12-07 MED ORDER — BREZTRI AEROSPHERE 160-9-4.8 MCG/ACT IN AERO: 2.0000 | INHALATION_SPRAY | Freq: Two times a day (BID) | RESPIRATORY_TRACT | 11 refills | Status: AC

## 2023-12-07 NOTE — Progress Notes (Addendum)
 Ph: [phone] Fax: [phone]   Patient ID: [name], male    DOB: Aug 27, 1955, 68 y.o.   MRN: [id number]  This visit was conducted in person.  BP (!) 140/82   Pulse 67   Temp 98.4 F (36.9 C) (Oral)   Ht 5' 9 (1.753 m)   Wt 247 lb 2 oz (112.1 kg)   SpO2 94%   BMI 36.49 kg/m   BP Readings from Last 3 Encounters:  12/07/23 (!) 140/82  11/24/23 114/70  11/17/23 128/80    CC: R low back pain  Subjective:   HPI: [name] is a 68 y.o. male presenting on 12/07/2023 for Back Pain (C/o Rt Side Lower Back. Started 3-4 days ago. Not Due to injury, No trouble with Bowels or urinating. )   Ongoing R sided headache - see prior note for details. ?chronic sinusitis in setting of chronic congestion - treated with 10d augmentin  course with some improvement - less severe and less frequent headaches. Last dose was today.  He continues OTC sinus allergy medication with decongestant and acetaminophen . Advised stop this given concern over ?MOH. May continue antihistamine, montelukast , flonase .  Pending neurology appt tomorrow [name]).   3-4d h/o R low back pain without preceding trauma/injury or falls.  Muscle relaxant didn't really help.  He wonders if he may have strained a muscle.  Progressively worsening, points to R lower back  Described as dull ache.  No UTI symptoms, no bowel changes, no blood in stool or urine, nausea/vomiting.  No shooting pain down legs, new numbness/weakness of legs, no bowel/bladder incontinence, saddle anesthesia, no fevers/chills.   He was very constipated 1 wk ago - treated with magnesium citrate with benefit.   Chronic dyspnea, not new or worsening, no new cough or wheezing or fever.  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   Breztri  refilled per [place] request.      Relevant past medical, surgical, family and social history reviewed and updated as indicated. Interim medical history since our last visit  reviewed. Allergies and medications reviewed and updated. Outpatient Medications Prior to Visit  Medication Sig Dispense Refill   acetaminophen  (TYLENOL ) 500 MG tablet Take 2 tablets (1,000 mg total) by mouth every 6 (six) hours as needed for mild pain.     albuterol  (VENTOLIN  HFA) 108 (90 Base) MCG/ACT inhaler Inhale 2 puffs into the lungs every 6 (six) hours as needed for wheezing or shortness of breath. 8.5 each 6   amiodarone  (PACERONE ) 200 MG tablet Take 1 tablet (200 mg total) by mouth daily. 90 tablet 3   Blood Glucose Monitoring Suppl DEVI 1 each by Does not apply route in the morning, at noon, and at bedtime. May substitute to any manufacturer covered by patient's insurance. 1 each 0   Cholecalciferol  (VITAMIN D ) 50 MCG (2000 UT) CAPS Take 1 capsule (2,000 Units total) by mouth daily. 30 capsule    dapagliflozin  propanediol (FARXIGA ) 10 MG TABS tablet Take 1 tablet (10 mg total) by mouth daily before breakfast. 90 tablet 3   Docusate Calcium (STOOL SOFTENER PO) Take 1 tablet by mouth daily.     FLUoxetine  (PROZAC ) 40 MG capsule TAKE 1 CAPSULE (40 MG TOTAL) BY MOUTH DAILY. 90 capsule 3   fluticasone  (FLONASE ) 50 MCG/ACT nasal spray Place 2 sprays into both nostrils daily. (Patient taking differently: Place 2 sprays into both nostrils as needed.) 48 mL 3   furosemide  (LASIX ) 80 MG tablet Take 1 tablet (  80 mg total) by mouth daily. With second dose as needed for fluid retention, leg swelling 120 tablet 3   glimepiride  (AMARYL ) 4 MG tablet Take 1 tablet (4 mg total) by mouth daily with breakfast. 90 tablet 4   Glucose Blood (BLOOD GLUCOSE TEST STRIPS) STRP 1 each by In Vitro route in the morning, at noon, and at bedtime. May substitute to any manufacturer covered by patient's insurance. 300 each 0   ipratropium-albuterol  (DUONEB) 0.5-2.5 (3) MG/3ML SOLN TAKE 3 MLS BY NEBULIZATION EVERY 6 (SIX) HOURS AS NEEDED (SEVERE SHORTNESS OF BREATH/WHEEZING). 360 mL 0   Lancet Device MISC 1 each by Does  not apply route in the morning, at noon, and at bedtime. May substitute to any manufacturer covered by patient's insurance. 300 each 0   Lancets Misc. MISC 1 each by Does not apply route in the morning, at noon, and at bedtime. May substitute to any manufacturer covered by patient's insurance. 300 each 0   metFORMIN  (GLUCOPHAGE ) 500 MG tablet Take 1 tablet (500 mg total) by mouth daily with breakfast. 90 tablet 3   methocarbamol  (ROBAXIN ) 500 MG tablet Take 1 tablet (500 mg total) by mouth 3 (three) times daily as needed for muscle spasms. 30 tablet 3   metolazone  (ZAROXOLYN ) 2.5 MG tablet Take 1 tablet (2.5 mg total) by mouth once a week. As needed for leg swelling 20 tablet 0   montelukast  (SINGULAIR ) 10 MG tablet Take 1 tablet (10 mg total) by mouth at bedtime. 90 tablet 4   OXYGEN  Inhale 2 L into the lungs at bedtime.     pantoprazole  (PROTONIX ) 40 MG tablet Take 1 tablet (40 mg total) by mouth 2 (two) times daily before a meal. 180 tablet 4   potassium chloride  (MICRO-K ) 10 MEQ CR capsule Take 1 capsule (10 mEq total) by mouth daily. 110 capsule 3   Semaglutide ,0.25 or 0.5MG /DOS, (OZEMPIC , 0.25 OR 0.5 MG/DOSE,) 2 MG/1.5ML SOPN Inject 0.5 mg into the skin once a week. 1.5 mL 6   Semaglutide ,0.25 or 0.5MG /DOS, (OZEMPIC , 0.25 OR 0.5 MG/DOSE,) 2 MG/3ML SOPN Inject 0.25 mg into the skin once a week. 3 mL 0   simvastatin  (ZOCOR ) 20 MG tablet Take 1 tablet (20 mg total) by mouth every evening. 90 tablet 4   triamcinolone  cream (KENALOG ) 0.1 % Apply 1 Application topically 2 (two) times daily. For no more than 10 days at a time 45 g 0   vitamin B-12 (CYANOCOBALAMIN ) 1000 MCG tablet Take 1 tablet (1,000 mcg total) by mouth every Monday, Wednesday, and Friday.     warfarin (COUMADIN ) 5 MG tablet TAKE 1 TABLET BY MOUTH DAILY EXCEPT TAKE 1/2 TABLET ON MONDAY, WEDNESDAY AND SATURDAY OR AS DIRECTED BY ANTICOAGULATION CLINIC 105 tablet 1   Budeson-Glycopyrrol-Formoterol  (BREZTRI  AEROSPHERE) 160-9-4.8 MCG/ACT  AERO Inhale 2 puffs into the lungs 2 (two) times daily. 32.1 g 11   No facility-administered medications prior to visit.     Per HPI unless specifically indicated in ROS section below Review of Systems  Objective:  BP (!) 140/82   Pulse 67   Temp 98.4 F (36.9 C) (Oral)   Ht 5' 9 (1.753 m)   Wt 247 lb 2 oz (112.1 kg)   SpO2 94%   BMI 36.49 kg/m   Wt Readings from Last 3 Encounters:  12/07/23 247 lb 2 oz (112.1 kg)  11/16/23 241 lb (109.3 kg)  10/25/23 242 lb (109.8 kg)      Physical Exam Vitals and nursing note reviewed.  Constitutional:      Appearance: Normal appearance. He is not ill-appearing.  HENT:     Head: Normocephalic and atraumatic.     Mouth/Throat:     Mouth: Mucous membranes are moist.     Pharynx: Oropharynx is clear. No oropharyngeal exudate or posterior oropharyngeal erythema.  Eyes:     Extraocular Movements: Extraocular movements intact.     Pupils: Pupils are equal, round, and reactive to light.  Cardiovascular:     Rate and Rhythm: Normal rate and regular rhythm.     Pulses: Normal pulses.     Heart sounds: Normal heart sounds. No murmur heard. Pulmonary:     Effort: Pulmonary effort is normal. No respiratory distress.     Breath sounds: Normal breath sounds. No wheezing, rhonchi or rales.     Comments: Bibasilar crackles R>L - h/o scarring on prior CXR 2024 Abdominal:     General: Bowel sounds are normal. There is no distension.     Palpations: Abdomen is soft. There is no mass.     Tenderness: There is no abdominal tenderness. There is no right CVA tenderness, left CVA tenderness, guarding or rebound.     Hernia: No hernia is present.  Musculoskeletal:        General: Tenderness present.     Right lower leg: No edema.     Left lower leg: No edema.     Comments:  No significant pain midline spine + R mid lumbar paraspinous mm tenderness Neg SLR bilaterally. No pain with int/ext rotation at hip.  Skin:    General: Skin is warm and dry.      Findings: No rash.  Neurological:     Mental Status: He is alert.  Psychiatric:        Mood and Affect: Mood normal.        Behavior: Behavior normal.       Results for orders placed or performed in visit on 11/24/23  POCT INR   Collection Time: 11/24/23 12:00 AM  Result Value Ref Range   INR 1.7 (A) 2.0 - 3.0   *Note: Due to a large number of results and/or encounters for the requested time period, some results have not been displayed. A complete set of results can be found in Results Review.    Assessment & Plan:   Problem List Items Addressed This Visit     Ex-smoker   # provided to call and schedule rpt lung cancer screen.       Right-sided headache   Notes some improvement after completing 10d augmentin  course for presumed sinusitis component to chronic R sided headache suggesting chronic sinusitis component to headache. [name] await upcoming neurology eval tomorrow, consider ENT eval if recurrent or worsening.  I did recommend he stop regular decongestant and acetaminophen  use - ?MOH.      COPD (chronic obstructive pulmonary disease) (HCC)   Breztri  refilled to [facility] per [place] PAP request.       Relevant Medications   budesonide-glycopyrrolate -formoterol  (BREZTRI  AEROSPHERE) 160-9-4.8 MCG/ACT AERO inhaler   Acute right-sided low back pain without sciatica - Primary   Story/exam most consistent with R lumbar strain - without red flags.  Supportive measures reviewed - heating pad with burn precautions, gentle stretching, refill robaxin  muscle relaxant, and provided with LBP exercises.  Update if not improving with treatment.         Meds ordered this encounter  Medications   budesonide-glycopyrrolate -formoterol  (BREZTRI  AEROSPHERE) 160-9-4.8 MCG/ACT AERO inhaler  Sig: Inhale 2 puffs into the lungs 2 (two) times daily.    Dispense:  32.1 g    Refill:  11    No orders of the defined types were placed in this encounter.   Patient Instructions   Await neurology evaluation tomorrow. Let me know if ENT referral needed.  Stop OTC allergy medicine with decongestant. May continue flonase , montelukast , antihistamine like zyrtec or claritin.   You are due for repeat CT chest. Contact lung cancer screening program to schedule repeat lung CT - phone number is [phone]  Right back pain - consistent with lumbar or lower back strain  Treat with muscle relaxant I have refilled robaxin , heating pad, gentle stretching, exercises provided today. Let us  know if not improving with this.   Follow up plan: Return if symptoms worsen or fail to improve.  Anton Blas, MD

## 2023-12-07 NOTE — Assessment & Plan Note (Signed)
#   provided to call and schedule rpt lung cancer screen.

## 2023-12-07 NOTE — Assessment & Plan Note (Signed)
 Story/exam most consistent with R lumbar strain - without red flags.  Supportive measures reviewed - heating pad with burn precautions, gentle stretching, refill robaxin  muscle relaxant, and provided with LBP exercises.  Update if not improving with treatment.

## 2023-12-07 NOTE — Addendum Note (Signed)
 Addended by: RILLA BALLER on: 12/07/2023 01:57 PM   Modules accepted: Level of Service

## 2023-12-07 NOTE — Patient Instructions (Addendum)
 Await neurology evaluation tomorrow. Let me know if ENT referral needed.  Stop OTC allergy medicine with decongestant. May continue flonase , montelukast , antihistamine like zyrtec or claritin.   You are due for repeat CT chest. Contact lung cancer screening program to schedule repeat lung CT - phone number is [phone]  Right back pain - consistent with lumbar or lower back strain  Treat with muscle relaxant I have refilled robaxin , heating pad, gentle stretching, exercises provided today. Let us  know if not improving with this.

## 2023-12-07 NOTE — Assessment & Plan Note (Signed)
 Breztri  refilled to [facility] per [place] PAP request.

## 2023-12-07 NOTE — Assessment & Plan Note (Addendum)
 Notes some improvement after completing 10d augmentin  course for presumed sinusitis component to chronic R sided headache suggesting chronic sinusitis component to headache. Will await upcoming neurology eval tomorrow, consider ENT eval if recurrent or worsening.  I did recommend he stop regular decongestant and acetaminophen  use - ?MOH.

## 2023-12-07 NOTE — Telephone Encounter (Signed)
 FYI Only or Action Required?: Action required by provider: request for appointment.  Patient was last seen in primary care on 11/16/2023 by Rilla Baller, MD.  Called Nurse Triage reporting Back Pain.  Symptoms began several days ago.  Interventions attempted: Nothing.  Symptoms are: unchanged.  Triage Disposition: See Physician Within 24 Hours  Patient/caregiver understands and will follow disposition?: YesCopied from CRM #[id number]. Topic: Clinical - Red Word Triage >> Dec 07, 2023  8:49 AM Gennette ORN wrote: Red Word that prompted transfer to Nurse Triage: Patient is calling because he has a severe back pain in his back for about 3-4 days now on his right side. He wants an appointment as soon as possible. Reason for Disposition  Numbness in a leg or foot (i.e., loss of sensation)  Answer Assessment - Initial Assessment Questions Pain is dependent on how movement.  Hurts to cough or take deep breath. Pt not sure of cause. Pt has numbness/weakness in legs but stated this is not new.    1. ONSET: When did the pain begin? (e.g., minutes, hours, days)     4 days ago 2. LOCATION: Where does it hurt? (upper, mid or lower back)     Lower right side of back  3. SEVERITY: How bad is the pain?  (e.g., Scale 1-10; mild, moderate, or severe)     6 4. PATTERN: Is the pain constant? (e.g., yes, no; constant, intermittent)      Comes and goes 5. RADIATION: Does the pain shoot into your legs or somewhere else?     denies 6. CAUSE:  What do you think is causing the back pain?      Not sure 7. BACK OVERUSE:  Any recent lifting of heavy objects, strenuous work or exercise?     denies 8. MEDICINES: What have you taken so far for the pain? (e.g., nothing, acetaminophen , NSAIDS)     denies 9. NEUROLOGIC SYMPTOMS: Do you have any weakness, numbness, or problems with bowel/bladder control?     Yes but  not new 10. OTHER SYMPTOMS: Do you have any other symptoms? (e.g., fever,  abdomen pain, burning with urination, blood in urine)       denies  Protocols used: Back Pain-A-AH

## 2023-12-07 NOTE — Telephone Encounter (Signed)
 Pt has OV today at 11:30.

## 2023-12-07 NOTE — Addendum Note (Signed)
 Addended by: RILLA BALLER on: 12/07/2023 01:56 PM   Modules accepted: Orders

## 2023-12-08 DIAGNOSIS — M542 Cervicalgia: Secondary | ICD-10-CM | POA: Diagnosis not present

## 2023-12-08 DIAGNOSIS — G4486 Cervicogenic headache: Secondary | ICD-10-CM | POA: Diagnosis not present

## 2023-12-08 DIAGNOSIS — R519 Headache, unspecified: Secondary | ICD-10-CM | POA: Diagnosis not present

## 2023-12-12 ENCOUNTER — Other Ambulatory Visit: Payer: Self-pay | Admitting: Family Medicine

## 2023-12-12 MED ORDER — OZEMPIC (0.25 OR 0.5 MG/DOSE) 2 MG/1.5ML ~~LOC~~ SOPN: 0.5000 mg | PEN_INJECTOR | SUBCUTANEOUS | 6 refills | Status: DC

## 2023-12-12 MED ORDER — OZEMPIC (0.25 OR 0.5 MG/DOSE) 2 MG/3ML ~~LOC~~ SOPN: 0.2500 mg | PEN_INJECTOR | SUBCUTANEOUS | 0 refills | Status: DC

## 2023-12-12 NOTE — Progress Notes (Signed)
 ERx semaglutide  to [facility]

## 2023-12-14 ENCOUNTER — Telehealth: Payer: Self-pay

## 2023-12-14 ENCOUNTER — Other Ambulatory Visit (INDEPENDENT_AMBULATORY_CARE_PROVIDER_SITE_OTHER): Admitting: Pharmacist

## 2023-12-14 DIAGNOSIS — E1169 Type 2 diabetes mellitus with other specified complication: Secondary | ICD-10-CM

## 2023-12-14 NOTE — Progress Notes (Signed)
 Complex Care Management Care Guide Note  12/14/2023 Name: ALEXSANDRO SALEK MRN: [id number] DOB: 1955-06-26  Elsie SHAUNNA Holsonback is a 68 y.o. year old male who is a primary care patient of Rilla Baller, MD and is actively engaged with the care management team. I reached out to [name] by phone today to assist with re-scheduling  with the Pharmacist.  Follow up plan: Telephone appointment with complex care management team member scheduled for:  12/14/23 at 3:00 p.m.   Dreama [facility]  [facility], [facility] VBCI Assistant Direct Dial: [phone]  Fax: [phone]

## 2023-12-14 NOTE — Progress Notes (Signed)
 Patient reports that he received a letter in the mail from [employer] regarding incomplete information.   Missing information per [employer] automated system.  Called and spoke w a representative. They report that the application just had to be processed. Was stuck in their system. No further information is required or missing.  Patient has been approved for [employer] program through 04/25/2024.  First shipment is expected within ~14 business days.  Reviewed approval with patient. Confirms understanding. Aware we will call him once order arrived to clinic in ~3 weeks.   He has no further questions/concerns at this time.   Manuelita FABIENE Kobs, PharmD Clinical Pharmacist [facility], [place] Ph: [phone]

## 2023-12-14 NOTE — Progress Notes (Signed)
 Complex Care Management Note Care Guide Note  12/14/2023 Name: [name] MRN: [id number] DOB: 10-01-1955   Complex Care Management Outreach Attempts: An unsuccessful telephone outreach was attempted today to offer the patient information about available complex care management services.  Follow Up Plan:  Additional outreach attempts will be made to offer the patient complex care management information and services.   Encounter Outcome:  No Answer  Dreama [facility]  [facility], [facility] VBCI Assistant Direct Dial: [phone]  Fax: [phone]

## 2023-12-15 ENCOUNTER — Ambulatory Visit (INDEPENDENT_AMBULATORY_CARE_PROVIDER_SITE_OTHER)

## 2023-12-15 DIAGNOSIS — Z7901 Long term (current) use of anticoagulants: Secondary | ICD-10-CM

## 2023-12-15 LAB — POCT INR: INR: 1.9 — AB (ref 2.0–3.0)

## 2023-12-15 NOTE — Patient Instructions (Addendum)
 Pre visit review using our clinic review tool, if applicable. No additional management support is needed unless otherwise documented below in the visit note.  Increase dose today to take 1 1/2 tablets and then change weekly dose to take 1 tablet daily except take 1/2 tablet on Wednesday and Saturday.  Recheck in 3 weeks.

## 2023-12-15 NOTE — Progress Notes (Signed)
 Pt reports chronic HA for several days but they stopped over one week ago. He is now reporting ear ringing and at one time he said he had difficulty walking. He has an apt with PCP tomorrow. Pt also reports he could have missed doses since last INR check and could also have taken more than he should have. He is unsure.  Increase dose today to take 1 1/2 tablets and then change weekly dose to take 1 tablet daily except take 1/2 tablet on Wednesday and Saturday.  Recheck in 3 weeks.

## 2023-12-16 ENCOUNTER — Encounter: Payer: Self-pay | Admitting: Family Medicine

## 2023-12-16 ENCOUNTER — Ambulatory Visit (INDEPENDENT_AMBULATORY_CARE_PROVIDER_SITE_OTHER): Admitting: Family Medicine

## 2023-12-16 VITALS — BP 134/82 | HR 71 | Temp 98.5°F | Ht 69.0 in | Wt 241.2 lb

## 2023-12-16 DIAGNOSIS — M545 Low back pain, unspecified: Secondary | ICD-10-CM

## 2023-12-16 DIAGNOSIS — I7143 Infrarenal abdominal aortic aneurysm, without rupture: Secondary | ICD-10-CM

## 2023-12-16 DIAGNOSIS — Z7984 Long term (current) use of oral hypoglycemic drugs: Secondary | ICD-10-CM

## 2023-12-16 DIAGNOSIS — R109 Unspecified abdominal pain: Secondary | ICD-10-CM | POA: Diagnosis not present

## 2023-12-16 DIAGNOSIS — E1169 Type 2 diabetes mellitus with other specified complication: Secondary | ICD-10-CM

## 2023-12-16 DIAGNOSIS — R519 Headache, unspecified: Secondary | ICD-10-CM

## 2023-12-16 LAB — COMPREHENSIVE METABOLIC PANEL WITH GFR
ALT: 9 U/L (ref 0–53)
AST: 11 U/L (ref 0–37)
Albumin: 4.3 g/dL (ref 3.5–5.2)
Alkaline Phosphatase: 72 U/L (ref 39–117)
BUN: 16 mg/dL (ref 6–23)
CO2: 36 meq/L — ABNORMAL HIGH (ref 19–32)
Calcium: 9.4 mg/dL (ref 8.4–10.5)
Chloride: 100 meq/L (ref 96–112)
Creatinine, Ser: 1.21 mg/dL (ref 0.40–1.50)
GFR: 61.63 mL/min (ref >60.00)
Glucose, Bld: 154 mg/dL — ABNORMAL HIGH (ref 70–99)
Potassium: 4.7 meq/L (ref 3.5–5.1)
Sodium: 142 meq/L (ref 135–145)
Total Bilirubin: 0.3 mg/dL (ref 0.2–1.2)
Total Protein: 7.2 g/dL (ref 6.0–8.3)

## 2023-12-16 LAB — CBC WITH DIFFERENTIAL/PLATELET
Basophils Absolute: 0 K/uL (ref 0.0–0.1)
Basophils Relative: 0.5 % (ref 0.0–3.0)
Eosinophils Absolute: 0.1 K/uL (ref 0.0–0.7)
Eosinophils Relative: 1.5 % (ref 0.0–5.0)
HCT: 43.8 % (ref 39.0–52.0)
Hemoglobin: 14.6 g/dL (ref 13.0–17.0)
Lymphocytes Relative: 14.9 % (ref 12.0–46.0)
Lymphs Abs: 1.4 K/uL (ref 0.7–4.0)
MCHC: 33.3 g/dL (ref 30.0–36.0)
MCV: 93.3 fl (ref 78.0–100.0)
Monocytes Absolute: 1 K/uL (ref 0.1–1.0)
Monocytes Relative: 10.3 % (ref 3.0–12.0)
Neutro Abs: 7 K/uL (ref 1.4–7.7)
Neutrophils Relative %: 72.8 % (ref 43.0–77.0)
Platelets: 233 K/uL (ref 150.0–400.0)
RBC: 4.69 Mil/uL (ref 4.22–5.81)
RDW: 16.5 % — ABNORMAL HIGH (ref 11.5–15.5)
WBC: 9.6 K/uL (ref 4.0–10.5)

## 2023-12-16 LAB — POC URINALSYSI DIPSTICK (AUTOMATED)
Bilirubin, UA: NEGATIVE
Blood, UA: NEGATIVE
Glucose, UA: POSITIVE — AB
Ketones, UA: NEGATIVE
Leukocytes, UA: NEGATIVE
Nitrite, UA: NEGATIVE
Protein, UA: NEGATIVE
Spec Grav, UA: 1.015 (ref 1.010–1.025)
Urobilinogen, UA: 0.2 U/dL
pH, UA: 6 (ref 5.0–8.0)

## 2023-12-16 NOTE — Patient Instructions (Addendum)
 Urine and blood work today  Urine with sugar but no signs of infection or blood.  Pending results we may do kidney imaging.  Use heating pad to tender area on right lower back.  I will place referral to [facility] for further evaluation of sinus contribution to chronic right sided headache.

## 2023-12-16 NOTE — Progress Notes (Signed)
 Ph: [phone] Fax: [phone]   Patient ID: [name], male    DOB: 10-26-1955, 68 y.o.   MRN: [id number]  This visit was conducted in person.  BP 134/82   Pulse 71   Temp 98.5 F (36.9 C) (Oral)   Ht 5' 9 (1.753 m)   Wt 241 lb 4 oz (109.4 kg)   SpO2 91%   BMI 35.63 kg/m    CC: R LBP  Subjective:   HPI: [name] is a 68 y.o. male presenting on 12/16/2023 for Medical Management of Chronic Issues (Pt C/o Lower R side pain Started 5 days ago. Pain is a 10. )   3 wk h/o R lower back pain at kidney - acutely worse over the past 5 days.  Last week R leg felt weak affecting ambulation.  Urinating well. No fevers/chills, dysuria, or frequency, hematuria, nausea, diarrhea, suprapubic discomfort. + urgency.  Some constipation.  No leg swelling.   Ongoing R sided headache as well as tinnitus that started 5 days ago - saw neurology - thought muscular, started on stretching exercises which haven't helped. Referred to PT. Known cervical DDD. He did feel augmentin  course helped decrease frequency/intensity of headache. Has not seen ENT - would be interested in evaluation for sinus component to chronic R sided headache.  Robaxin  MR didn't help back or HA.   May be due for f/u imaging of infrarenal AAA.   DM - hasn't started ozempic  yet.      Relevant past medical, surgical, family and social history reviewed and updated as indicated. Interim medical history since our last visit reviewed. Allergies and medications reviewed and updated. Outpatient Medications Prior to Visit  Medication Sig Dispense Refill  . acetaminophen  (TYLENOL ) 500 MG tablet Take 2 tablets (1,000 mg total) by mouth every 6 (six) hours as needed for mild pain.    . albuterol  (VENTOLIN  HFA) 108 (90 Base) MCG/ACT inhaler Inhale 2 puffs into the lungs every 6 (six) hours as needed for wheezing or shortness of breath. 8.5 each 6  . amiodarone  (PACERONE ) 200 MG tablet Take 1 tablet (200 mg total) by  mouth daily. 90 tablet 3  . Blood Glucose Monitoring Suppl DEVI 1 each by Does not apply route in the morning, at noon, and at bedtime. May substitute to any manufacturer covered by patient's insurance. 1 each 0  . budesonide-glycopyrrolate -formoterol  (BREZTRI  AEROSPHERE) 160-9-4.8 MCG/ACT AERO inhaler Inhale 2 puffs into the lungs 2 (two) times daily. 32.1 g 11  . Cholecalciferol  (VITAMIN D ) 50 MCG (2000 UT) CAPS Take 1 capsule (2,000 Units total) by mouth daily. 30 capsule   . dapagliflozin  propanediol (FARXIGA ) 10 MG TABS tablet Take 1 tablet (10 mg total) by mouth daily before breakfast. 90 tablet 3  . Docusate Calcium (STOOL SOFTENER PO) Take 1 tablet by mouth daily.    . FLUoxetine  (PROZAC ) 40 MG capsule TAKE 1 CAPSULE (40 MG TOTAL) BY MOUTH DAILY. 90 capsule 3  . fluticasone  (FLONASE ) 50 MCG/ACT nasal spray Place 2 sprays into both nostrils daily. (Patient taking differently: Place 2 sprays into both nostrils as needed.) 48 mL 3  . furosemide  (LASIX ) 80 MG tablet Take 1 tablet (80 mg total) by mouth daily. With second dose as needed for fluid retention, leg swelling 120 tablet 3  . glimepiride  (AMARYL ) 4 MG tablet Take 1 tablet (4 mg total) by mouth daily with breakfast. 90 tablet 4  . Glucose Blood (BLOOD GLUCOSE TEST STRIPS) STRP 1 each by In  Vitro route in the morning, at noon, and at bedtime. May substitute to any manufacturer covered by patient's insurance. 300 each 0  . ipratropium-albuterol  (DUONEB) 0.5-2.5 (3) MG/3ML SOLN TAKE 3 MLS BY NEBULIZATION EVERY 6 (SIX) HOURS AS NEEDED (SEVERE SHORTNESS OF BREATH/WHEEZING). 360 mL 0  . Lancet Device MISC 1 each by Does not apply route in the morning, at noon, and at bedtime. May substitute to any manufacturer covered by patient's insurance. 300 each 0  . Lancets Misc. MISC 1 each by Does not apply route in the morning, at noon, and at bedtime. May substitute to any manufacturer covered by patient's insurance. 300 each 0  . latanoprost (XALATAN)  0.005 % ophthalmic solution 1 drop at bedtime.    . metFORMIN  (GLUCOPHAGE ) 500 MG tablet Take 1 tablet (500 mg total) by mouth daily with breakfast. 90 tablet 3  . methocarbamol  (ROBAXIN ) 500 MG tablet Take 1 tablet (500 mg total) by mouth 3 (three) times daily as needed for muscle spasms. 30 tablet 3  . metolazone  (ZAROXOLYN ) 2.5 MG tablet Take 1 tablet (2.5 mg total) by mouth once a week. As needed for leg swelling 20 tablet 0  . montelukast  (SINGULAIR ) 10 MG tablet Take 1 tablet (10 mg total) by mouth at bedtime. 90 tablet 4  . OXYGEN  Inhale 2 L into the lungs at bedtime.    . pantoprazole  (PROTONIX ) 40 MG tablet Take 1 tablet (40 mg total) by mouth 2 (two) times daily before a meal. 180 tablet 4  . potassium chloride  (MICRO-K ) 10 MEQ CR capsule Take 1 capsule (10 mEq total) by mouth daily. 110 capsule 3  . [START ON 01/09/2024] Semaglutide ,0.25 or 0.5MG /DOS, (OZEMPIC , 0.25 OR 0.5 MG/DOSE,) 2 MG/1.5ML SOPN Inject 0.5 mg into the skin once a week. 1.5 mL 6  . Semaglutide ,0.25 or 0.5MG /DOS, (OZEMPIC , 0.25 OR 0.5 MG/DOSE,) 2 MG/3ML SOPN Inject 0.25 mg into the skin once a week. 3 mL 0  . simvastatin  (ZOCOR ) 20 MG tablet Take 1 tablet (20 mg total) by mouth every evening. 90 tablet 4  . triamcinolone  cream (KENALOG ) 0.1 % Apply 1 Application topically 2 (two) times daily. For no more than 10 days at a time 45 g 0  . vitamin B-12 (CYANOCOBALAMIN ) 1000 MCG tablet Take 1 tablet (1,000 mcg total) by mouth every Monday, Wednesday, and Friday.    . warfarin (COUMADIN ) 5 MG tablet TAKE 1 TABLET BY MOUTH DAILY EXCEPT TAKE 1/2 TABLET ON MONDAY, WEDNESDAY AND SATURDAY OR AS DIRECTED BY ANTICOAGULATION CLINIC 105 tablet 1   No facility-administered medications prior to visit.     Per HPI unless specifically indicated in ROS section below Review of Systems  Objective:  BP 134/82   Pulse 71   Temp 98.5 F (36.9 C) (Oral)   Ht 5' 9 (1.753 m)   Wt 241 lb 4 oz (109.4 kg)   SpO2 91%   BMI 35.63 kg/m    Wt Readings from Last 3 Encounters:  12/16/23 241 lb 4 oz (109.4 kg)  12/07/23 247 lb 2 oz (112.1 kg)  11/16/23 241 lb (109.3 kg)      Physical Exam Vitals and nursing note reviewed.  Constitutional:      Appearance: Normal appearance. He is not ill-appearing.  HENT:     Head: Normocephalic and atraumatic.     Mouth/Throat:     Mouth: Mucous membranes are moist.     Pharynx: Oropharynx is clear. No oropharyngeal exudate or posterior oropharyngeal erythema.  Eyes:  Extraocular Movements: Extraocular movements intact.     Pupils: Pupils are equal, round, and reactive to light.  Neck:     Comments:  Mild midline discomfort to palpation at ~C7 Cardiovascular:     Rate and Rhythm: Normal rate and regular rhythm.     Pulses: Normal pulses.     Heart sounds: Normal heart sounds. No murmur heard. Pulmonary:     Effort: Pulmonary effort is normal. No respiratory distress.     Breath sounds: No wheezing, rhonchi or rales.     Comments: Bibasilar crackles Abdominal:     General: Bowel sounds are normal. There is no distension.     Palpations: There is no mass.     Tenderness: There is no abdominal tenderness. There is right CVA tenderness. There is no left CVA tenderness, guarding or rebound.     Hernia: No hernia is present.  Musculoskeletal:     Cervical back: Neck supple. No tenderness.     Right lower leg: No edema.     Left lower leg: No edema.     Comments:  No midline lumbar spine pain ++ reproducible R mid lumbar paraspinous mm tenderness  Neg SLR bilaterally, although does have R low back pain with SLR on right. No pain with int/ext rotation at hip.  Skin:    General: Skin is warm and dry.     Findings: Rash (to BLE) present.  Neurological:     Mental Status: He is alert.  Psychiatric:        Mood and Affect: Mood normal.        Behavior: Behavior normal.       Results for orders placed or performed in visit on 12/16/23  CBC with Differential/Platelet    Collection Time: 12/16/23  7:53 AM  Result Value Ref Range   WBC 9.6 4.0 - 10.5 K/uL   RBC 4.69 4.22 - 5.81 Mil/uL   Hemoglobin 14.6 13.0 - 17.0 g/dL   HCT 56.1 60.9 - 47.9 %   MCV 93.3 78.0 - 100.0 fl   MCHC 33.3 30.0 - 36.0 g/dL   RDW 83.4 (H) 88.4 - 84.4 %   Platelets 233.0 150.0 - 400.0 K/uL   Neutrophils Relative % 72.8 43.0 - 77.0 %   Lymphocytes Relative 14.9 12.0 - 46.0 %   Monocytes Relative 10.3 3.0 - 12.0 %   Eosinophils Relative 1.5 0.0 - 5.0 %   Basophils Relative 0.5 0.0 - 3.0 %   Neutro Abs 7.0 1.4 - 7.7 K/uL   Lymphs Abs 1.4 0.7 - 4.0 K/uL   Monocytes Absolute 1.0 0.1 - 1.0 K/uL   Eosinophils Absolute 0.1 0.0 - 0.7 K/uL   Basophils Absolute 0.0 0.0 - 0.1 K/uL  Comprehensive metabolic panel with GFR   Collection Time: 12/16/23  7:53 AM  Result Value Ref Range   Sodium 142 135 - 145 mEq/L   Potassium 4.7 3.5 - 5.1 mEq/L   Chloride 100 96 - 112 mEq/L   CO2 36 (H) 19 - 32 mEq/L   Glucose, Bld 154 (H) 70 - 99 mg/dL   BUN 16 6 - 23 mg/dL   Creatinine, Ser 8.78 0.40 - 1.50 mg/dL   Total Bilirubin 0.3 0.2 - 1.2 mg/dL   Alkaline Phosphatase 72 39 - 117 U/L   AST 11 0 - 37 U/L   ALT 9 0 - 53 U/L   Total Protein 7.2 6.0 - 8.3 g/dL   Albumin 4.3 3.5 - 5.2 g/dL  GFR 61.63 >60.00 mL/min   Calcium 9.4 8.4 - 10.5 mg/dL  POCT Urinalysis Dipstick (Automated)   Collection Time: 12/16/23  8:07 AM  Result Value Ref Range   Color, UA yellow    Clarity, UA clear    Glucose, UA Positive (A) Negative   Bilirubin, UA neg    Ketones, UA neg    Spec Grav, UA 1.015 1.010 - 1.025   Blood, UA neg    pH, UA 6.0 5.0 - 8.0   Protein, UA Negative Negative   Urobilinogen, UA 0.2 0.2 or 1.0 E.U./dL   Nitrite, UA neg    Leukocytes, UA Negative Negative   *Note: Due to a large number of results and/or encounters for the requested time period, some results have not been displayed. A complete set of results can be found in Results Review.   Lab Results  Component Value Date   HGBA1C  7.4 (H) 09/14/2023   Assessment & Plan:   Problem List Items Addressed This Visit     Type 2 diabetes mellitus with other specified complication (HCC)   Pending ozempic  commencement -awaiting Ozempic  through PAP.       Right-sided headache   Ongoing. New tinnitus. No vertigo.  Saw neurology - referred to PT for cervicogenic component, known cervical DDD. Muscle relaxant hasn't helped.  He did not improvement but not resolution after augmentin  antibiotic course.  Will request ENT evaluation for possible chronic sinusitis component to chronic headache present for 2 months.  Inflammatory markers were normal pointing against temporal arteritis.  Had reassuring ophthalmologic exam.       Relevant Orders   Ambulatory referral to ENT   AAA (abdominal aortic aneurysm) without rupture (HCC)   Will update AAA US  for known infrarenal aortic dilation to 3.9cm (last imaged 12/2021).       Relevant Orders   VAS US  AAA DUPLEX   Acute right-sided low back pain without sciatica - Primary   Still consistent with MSK cause given reproducible tenderness to palpation of R paraspinous mm.  No symptoms of radiculopathy.  Muscle relaxant was not helpful. Consider prednisone  course. Avoid NSAIDs in h/o GIB.  Rec heating pad, update labs including UA and CMP.CBC.  Reassess at f/u visit next week.  Consider updated imaging.       Relevant Orders   POCT Urinalysis Dipstick (Automated) (Completed)   Other Visit Diagnoses       Right flank pain       Relevant Orders   POCT Urinalysis Dipstick (Automated) (Completed)   CBC with Differential/Platelet (Completed)   Comprehensive metabolic panel with GFR (Completed)     Long term current use of oral hypoglycemic drug            No orders of the defined types were placed in this encounter.   Orders Placed This Encounter  Procedures  . CBC with Differential/Platelet  . Comprehensive metabolic panel with GFR  . Ambulatory referral to ENT     Referral Priority:   Routine    Referral Type:   Consultation    Referral Reason:   Specialty Services Required    Requested Specialty:   Otolaryngology    Number of Visits Requested:   1  . POCT Urinalysis Dipstick (Automated)    Patient Instructions  Urine and blood work today  Urine with sugar but no signs of infection or blood.  Pending results we may do kidney imaging.  Use heating pad to tender area on right lower back.  I will place referral to [facility]NT for further evaluation of sinus contribution to chronic right sided headache.   Follow up plan: Return if symptoms worsen or fail to improve.  Anton Blas, MD

## 2023-12-17 NOTE — Assessment & Plan Note (Addendum)
 Still consistent with MSK cause given reproducible tenderness to palpation of R paraspinous mm.  No symptoms of radiculopathy.  Muscle relaxant was not helpful. Consider prednisone  course. Avoid NSAIDs in h/o GIB.  Rec heating pad, update labs including UA and CMP.CBC.  Reassess at f/u visit next week.  Consider updated imaging.

## 2023-12-17 NOTE — Assessment & Plan Note (Addendum)
 Ongoing. New tinnitus. No vertigo.  Saw neurology - referred to PT for cervicogenic component, known cervical DDD. Muscle relaxant hasn't helped.  He did not improvement but not resolution after augmentin  antibiotic course.  Will request ENT evaluation for possible chronic sinusitis component to chronic headache present for 2 months.  Inflammatory markers were normal pointing against temporal arteritis.  Had reassuring ophthalmologic exam.

## 2023-12-17 NOTE — Assessment & Plan Note (Signed)
 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

## 2023-12-17 NOTE — Assessment & Plan Note (Signed)
 Pending ozempic  commencement -awaiting Ozempic  through PAP.

## 2023-12-18 ENCOUNTER — Ambulatory Visit: Payer: Self-pay | Admitting: Family Medicine

## 2023-12-18 DIAGNOSIS — I7143 Infrarenal abdominal aortic aneurysm, without rupture: Secondary | ICD-10-CM

## 2023-12-19 ENCOUNTER — Telehealth: Payer: Self-pay

## 2023-12-19 ENCOUNTER — Encounter: Payer: Self-pay | Admitting: Family Medicine

## 2023-12-19 ENCOUNTER — Ambulatory Visit: Admitting: Family Medicine

## 2023-12-19 VITALS — BP 140/80 | HR 67 | Temp 98.3°F | Ht 69.0 in | Wt 242.0 lb

## 2023-12-19 DIAGNOSIS — Z7985 Long-term (current) use of injectable non-insulin antidiabetic drugs: Secondary | ICD-10-CM

## 2023-12-19 DIAGNOSIS — M545 Low back pain, unspecified: Secondary | ICD-10-CM

## 2023-12-19 DIAGNOSIS — H409 Unspecified glaucoma: Secondary | ICD-10-CM

## 2023-12-19 DIAGNOSIS — Z7984 Long term (current) use of oral hypoglycemic drugs: Secondary | ICD-10-CM | POA: Diagnosis not present

## 2023-12-19 DIAGNOSIS — E1169 Type 2 diabetes mellitus with other specified complication: Secondary | ICD-10-CM | POA: Diagnosis not present

## 2023-12-19 DIAGNOSIS — H401123 Primary open-angle glaucoma, left eye, severe stage: Secondary | ICD-10-CM | POA: Diagnosis not present

## 2023-12-19 DIAGNOSIS — R519 Headache, unspecified: Secondary | ICD-10-CM

## 2023-12-19 DIAGNOSIS — H401111 Primary open-angle glaucoma, right eye, mild stage: Secondary | ICD-10-CM | POA: Diagnosis not present

## 2023-12-19 LAB — POCT GLYCOSYLATED HEMOGLOBIN (HGB A1C): Hemoglobin A1C: 6.9 % — AB (ref 4.0–5.6)

## 2023-12-19 NOTE — Patient Instructions (Addendum)
 Call lung cancer screening program phone number is [phone] as you're due for your lung cancer screen this year.  A1c was improved down to 6.9% - drop amaryl  (glimepiride ) dose to 1/2 table daily or 2 mg daily.  Let us  know if any trouble with ozempic  or if sugars start trending down, then fully stop glimepiride .  Good to see you today.  Return in 3 months for diabetes follow up visit.

## 2023-12-19 NOTE — Telephone Encounter (Signed)
 Received patient assistance medications for patient.  Ozempic   4boxes  Medications have been placed in the refrigerator and labeled with patient information  and We have left message for patient to call office.    Pt in office for follow up today 12/19/23. Will give at that time.

## 2023-12-19 NOTE — Progress Notes (Unsigned)
 Ph: [phone] Fax: [phone]   Patient ID: [name], male    DOB: 1956/03/25, 68 y.o.   MRN: [id number]  This visit was conducted in person.  BP (!) 140/80   Pulse 67   Temp 98.3 F (36.8 C) (Oral)   Ht 5' 9 (1.753 m)   Wt 242 lb (109.8 kg)   SpO2 93%   BMI 35.74 kg/m   BP Readings from Last 3 Encounters:  12/19/23 (!) 140/80  12/16/23 134/82  12/07/23 (!) 140/82   CC: DM f/u visit  Subjective:   HPI: [name] is a 68 y.o. male presenting on 12/19/2023 for Medical Management of Chronic Issues (Pt here for 1 mth f/U DM)   See prior note for details  R low back pain improving with time, heating pad, robaxin  muscle relaxant.   Due for lung cancer screening, CT last done 08/2022. # provided to call and reschedule.   Severe L eye glaucoma - just saw Dr Portia today. Continues latanoprost eye drops.  Notes am headache. Continues nocturnal oxygen . CPAP intolerant.   New start Ozempic  - we just received order for this - given to patient.  DM - does regularly check sugars 150s - both fasting and non-fasting. Compliant with antihyperglycemic regimen which includes: farxiga  10mg  daily, amaryl  4mg  daily, metformin  500mg  daily, and new start Ozempic  as per above. Denies low sugars or hypoglycemic symptoms. Ongoing paresthesias, no blurry vision. Last diabetic eye exam 11/01/2023. Glucometer brand: accuchek, previously using one touch. Last foot exam: DUE. DSME: declined.  Lab Results  Component Value Date   HGBA1C 6.9 (A) 12/19/2023   Diabetic Foot Exam - Simple   Simple Foot Form Diabetic Foot exam was performed with the following findings: Yes 12/20/2023  7:20 AM  Visual Inspection See comments: Yes Sensation Testing See comments: Yes Pulse Check Posterior Tibialis and Dorsalis pulse intact bilaterally: Yes Comments No claudication Thickened toenails bilaterally Diminished sensation to monofilament testing throughout    Lab Results   Component Value Date   MICROALBUR 1.1 09/14/2023        Relevant past medical, surgical, family and social history reviewed and updated as indicated. Interim medical history since our last visit reviewed. Allergies and medications reviewed and updated. Outpatient Medications Prior to Visit  Medication Sig Dispense Refill   acetaminophen  (TYLENOL ) 500 MG tablet Take 2 tablets (1,000 mg total) by mouth every 6 (six) hours as needed for mild pain.     albuterol  (VENTOLIN  HFA) 108 (90 Base) MCG/ACT inhaler Inhale 2 puffs into the lungs every 6 (six) hours as needed for wheezing or shortness of breath. 8.5 each 6   amiodarone  (PACERONE ) 200 MG tablet Take 1 tablet (200 mg total) by mouth daily. 90 tablet 3   Blood Glucose Monitoring Suppl (ACCU-CHEK GUIDE ME) w/Device KIT 2 (two) times daily.     Blood Glucose Monitoring Suppl DEVI 1 each by Does not apply route in the morning, at noon, and at bedtime. May substitute to any manufacturer covered by patient's insurance. 1 each 0   budesonide-glycopyrrolate -formoterol  (BREZTRI  AEROSPHERE) 160-9-4.8 MCG/ACT AERO inhaler Inhale 2 puffs into the lungs 2 (two) times daily. 32.1 g 11   Cholecalciferol  (VITAMIN D ) 50 MCG (2000 UT) CAPS Take 1 capsule (2,000 Units total) by mouth daily. 30 capsule    dapagliflozin  propanediol (FARXIGA ) 10 MG TABS tablet Take 1 tablet (10 mg total) by mouth daily before breakfast. 90 tablet 3   Docusate Calcium (STOOL SOFTENER PO) Take  1 tablet by mouth daily.     FLUoxetine  (PROZAC ) 40 MG capsule TAKE 1 CAPSULE (40 MG TOTAL) BY MOUTH DAILY. 90 capsule 3   fluticasone  (FLONASE ) 50 MCG/ACT nasal spray Place 2 sprays into both nostrils daily. (Patient taking differently: Place 2 sprays into both nostrils as needed.) 48 mL 3   furosemide  (LASIX ) 80 MG tablet Take 1 tablet (80 mg total) by mouth daily. With second dose as needed for fluid retention, leg swelling 120 tablet 3   Glucose Blood (BLOOD GLUCOSE TEST STRIPS) STRP 1 each  by In Vitro route in the morning, at noon, and at bedtime. May substitute to any manufacturer covered by patient's insurance. 300 each 0   ipratropium-albuterol  (DUONEB) 0.5-2.5 (3) MG/3ML SOLN TAKE 3 MLS BY NEBULIZATION EVERY 6 (SIX) HOURS AS NEEDED (SEVERE SHORTNESS OF BREATH/WHEEZING). 360 mL 0   Lancet Device MISC 1 each by Does not apply route in the morning, at noon, and at bedtime. May substitute to any manufacturer covered by patient's insurance. 300 each 0   Lancets Misc. MISC 1 each by Does not apply route in the morning, at noon, and at bedtime. May substitute to any manufacturer covered by patient's insurance. 300 each 0   metFORMIN  (GLUCOPHAGE ) 500 MG tablet Take 1 tablet (500 mg total) by mouth daily with breakfast. 90 tablet 3   methocarbamol  (ROBAXIN ) 500 MG tablet Take 1 tablet (500 mg total) by mouth 3 (three) times daily as needed for muscle spasms. 30 tablet 3   metolazone  (ZAROXOLYN ) 2.5 MG tablet Take 1 tablet (2.5 mg total) by mouth once a week. As needed for leg swelling 20 tablet 0   montelukast  (SINGULAIR ) 10 MG tablet Take 1 tablet (10 mg total) by mouth at bedtime. 90 tablet 4   OXYGEN  Inhale 2 L into the lungs at bedtime.     pantoprazole  (PROTONIX ) 40 MG tablet Take 1 tablet (40 mg total) by mouth 2 (two) times daily before a meal. 180 tablet 4   potassium chloride  (MICRO-K ) 10 MEQ CR capsule Take 1 capsule (10 mEq total) by mouth daily. 110 capsule 3   [START ON 01/09/2024] Semaglutide ,0.25 or 0.5MG /DOS, (OZEMPIC , 0.25 OR 0.5 MG/DOSE,) 2 MG/1.5ML SOPN Inject 0.5 mg into the skin once a week. 1.5 mL 6   Semaglutide ,0.25 or 0.5MG /DOS, (OZEMPIC , 0.25 OR 0.5 MG/DOSE,) 2 MG/3ML SOPN Inject 0.25 mg into the skin once a week. 3 mL 0   simvastatin  (ZOCOR ) 20 MG tablet Take 1 tablet (20 mg total) by mouth every evening. 90 tablet 4   triamcinolone  cream (KENALOG ) 0.1 % Apply 1 Application topically 2 (two) times daily. For no more than 10 days at a time 45 g 0   vitamin B-12  (CYANOCOBALAMIN ) 1000 MCG tablet Take 1 tablet (1,000 mcg total) by mouth every Monday, Wednesday, and Friday.     warfarin (COUMADIN ) 5 MG tablet TAKE 1 TABLET BY MOUTH DAILY EXCEPT TAKE 1/2 TABLET ON MONDAY, WEDNESDAY AND SATURDAY OR AS DIRECTED BY ANTICOAGULATION CLINIC 105 tablet 1   glimepiride  (AMARYL ) 4 MG tablet Take 1 tablet (4 mg total) by mouth daily with breakfast. 90 tablet 4   latanoprost (XALATAN) 0.005 % ophthalmic solution 1 drop at bedtime.     glimepiride  (AMARYL ) 4 MG tablet Take 0.5 tablets (2 mg total) by mouth daily with breakfast.     latanoprost (XALATAN) 0.005 % ophthalmic solution Place 1 drop into both eyes at bedtime.     No facility-administered medications prior to visit.  Per HPI unless specifically indicated in ROS section below Review of Systems  Objective:  BP (!) 140/80   Pulse 67   Temp 98.3 F (36.8 C) (Oral)   Ht 5' 9 (1.753 m)   Wt 242 lb (109.8 kg)   SpO2 93%   BMI 35.74 kg/m   Wt Readings from Last 3 Encounters:  12/19/23 242 lb (109.8 kg)  12/16/23 241 lb 4 oz (109.4 kg)  12/07/23 247 lb 2 oz (112.1 kg)      Physical Exam Vitals and nursing note reviewed.  Constitutional:      Appearance: Normal appearance. He is not ill-appearing.  HENT:     Head: Normocephalic and atraumatic.     Mouth/Throat:     Mouth: Mucous membranes are moist.     Pharynx: Oropharynx is clear. No oropharyngeal exudate or posterior oropharyngeal erythema.  Eyes:     Extraocular Movements: Extraocular movements intact.     Conjunctiva/sclera: Conjunctivae normal.     Pupils: Pupils are equal, round, and reactive to light.  Cardiovascular:     Rate and Rhythm: Normal rate and regular rhythm.     Pulses: Normal pulses.     Heart sounds: Normal heart sounds. No murmur heard. Pulmonary:     Effort: Pulmonary effort is normal. No respiratory distress.     Breath sounds: Normal breath sounds. No wheezing, rhonchi or rales.     Comments: Bibasilar  crackles Musculoskeletal:     Right lower leg: No edema.     Left lower leg: No edema.     Comments: See HPI for foot exam if done  Skin:    General: Skin is warm and dry.     Findings: No rash.  Neurological:     Mental Status: He is alert.  Psychiatric:        Mood and Affect: Mood normal.        Behavior: Behavior normal.       Results for orders placed or performed in visit on 12/19/23  POCT glycosylated hemoglobin (Hb A1C)   Collection Time: 12/19/23  3:21 PM  Result Value Ref Range   Hemoglobin A1C 6.9 (A) 4.0 - 5.6 %   HbA1c POC (<> result, manual entry)     HbA1c, POC (prediabetic range)     HbA1c, POC (controlled diabetic range)     *Note: Due to a large number of results and/or encounters for the requested time period, some results have not been displayed. A complete set of results can be found in Results Review.    Assessment & Plan:   Problem List Items Addressed This Visit     Type 2 diabetes mellitus with other specified complication (HCC) - Primary   He continues metformin  500 mg daily, glimepiride  4 mg daily, Farxiga  10 mg daily, and is about to start Ozempic  which was received by our office this week. A1c improved to 6.9%. With Ozempic  commencement, drop glimepiride  to 2 mg daily. Reassess at 3 month diabetes follow-up visit      Relevant Medications   glimepiride  (AMARYL ) 4 MG tablet   Other Relevant Orders   POCT glycosylated hemoglobin (Hb A1C) (Completed)   Right-sided headache   Ongoing, anticipate multifactorial: Saw neurology - pending physical therapy for cervicogenic component. Pending ENT referral to evaluate sinus component.  He did note improvement after taking Augmentin  antibiotic course. With endorsed morning headaches, anticipate component of untreated sleep apnea. Prior eye exam reassuring, prior inflammatory markers normal.  Glaucoma (increased eye pressure)   Appreciate [facility].      Relevant Medications    latanoprost (XALATAN) 0.005 % ophthalmic solution   Acute right-sided low back pain without sciatica   This continues improving with conservative treatment - heating pad, robaxin , rest.         No orders of the defined types were placed in this encounter.   Orders Placed This Encounter  Procedures   POCT glycosylated hemoglobin (Hb A1C)    Patient Instructions  Call lung cancer screening program phone number is [phone] as you're due for your lung cancer screen this year.  A1c was improved down to 6.9% - drop amaryl  (glimepiride ) dose to 1/2 table daily or 2 mg daily.  Let us  know if any trouble with ozempic  or if sugars start trending down, then fully stop glimepiride .  Good to see you today.  Return in 3 months for diabetes follow up visit.   Follow up plan: Return in about 3 months (around 03/20/2024) for follow up visit.  Anton Blas, MD

## 2023-12-20 ENCOUNTER — Encounter: Payer: Self-pay | Admitting: Family Medicine

## 2023-12-20 NOTE — Assessment & Plan Note (Addendum)
 Ongoing, anticipate multifactorial: Saw neurology - pending physical therapy for cervicogenic component. Pending ENT referral to evaluate sinus component.  He did note improvement after taking Augmentin  antibiotic course. With endorsed morning headaches, anticipate component of untreated sleep apnea. Prior eye exam reassuring, prior inflammatory markers normal.

## 2023-12-20 NOTE — Assessment & Plan Note (Signed)
 This continues improving with conservative treatment - heating pad, robaxin , rest.

## 2023-12-20 NOTE — Assessment & Plan Note (Signed)
 He continues metformin  500 mg daily, glimepiride  4 mg daily, Farxiga  10 mg daily, and is about to start Ozempic  which was received by our office this week. A1c improved to 6.9%. With Ozempic  commencement, drop glimepiride  to 2 mg daily. Reassess at 3 month diabetes follow-up visit

## 2023-12-20 NOTE — Assessment & Plan Note (Signed)
Appreciate [facility].

## 2023-12-29 ENCOUNTER — Other Ambulatory Visit: Payer: Self-pay | Admitting: Family Medicine

## 2023-12-29 DIAGNOSIS — F410 Panic disorder [episodic paroxysmal anxiety] without agoraphobia: Secondary | ICD-10-CM

## 2024-01-04 ENCOUNTER — Ambulatory Visit (INDEPENDENT_AMBULATORY_CARE_PROVIDER_SITE_OTHER): Payer: Self-pay

## 2024-01-04 ENCOUNTER — Emergency Department

## 2024-01-04 ENCOUNTER — Other Ambulatory Visit: Payer: Self-pay

## 2024-01-04 ENCOUNTER — Inpatient Hospital Stay
Admission: EM | Admit: 2024-01-04 | Discharge: 2024-01-05 | DRG: 193 | Disposition: A | Discharge status: Home / Self Care | Attending: Internal Medicine | Admitting: Internal Medicine

## 2024-01-04 ENCOUNTER — Ambulatory Visit: Admitting: Family Medicine

## 2024-01-04 ENCOUNTER — Encounter: Payer: Self-pay | Admitting: Family Medicine

## 2024-01-04 VITALS — BP 170/100 | HR 84 | Temp 102.1°F | Resp 28 | Ht 69.0 in | Wt 235.0 lb

## 2024-01-04 DIAGNOSIS — R791 Abnormal coagulation profile: Secondary | ICD-10-CM

## 2024-01-04 DIAGNOSIS — R0902 Hypoxemia: Secondary | ICD-10-CM | POA: Diagnosis not present

## 2024-01-04 DIAGNOSIS — Z7985 Long-term (current) use of injectable non-insulin antidiabetic drugs: Secondary | ICD-10-CM

## 2024-01-04 DIAGNOSIS — J449 Chronic obstructive pulmonary disease, unspecified: Secondary | ICD-10-CM | POA: Diagnosis present

## 2024-01-04 DIAGNOSIS — E1169 Type 2 diabetes mellitus with other specified complication: Secondary | ICD-10-CM | POA: Diagnosis not present

## 2024-01-04 DIAGNOSIS — E66811 Obesity, class 1: Secondary | ICD-10-CM | POA: Diagnosis present

## 2024-01-04 DIAGNOSIS — J9612 Chronic respiratory failure with hypercapnia: Secondary | ICD-10-CM | POA: Diagnosis not present

## 2024-01-04 DIAGNOSIS — R509 Fever, unspecified: Secondary | ICD-10-CM | POA: Insufficient documentation

## 2024-01-04 DIAGNOSIS — Z7901 Long term (current) use of anticoagulants: Secondary | ICD-10-CM

## 2024-01-04 DIAGNOSIS — I50810 Right heart failure, unspecified: Secondary | ICD-10-CM

## 2024-01-04 DIAGNOSIS — I480 Paroxysmal atrial fibrillation: Secondary | ICD-10-CM | POA: Diagnosis not present

## 2024-01-04 DIAGNOSIS — E785 Hyperlipidemia, unspecified: Secondary | ICD-10-CM | POA: Diagnosis present

## 2024-01-04 DIAGNOSIS — F32A Depression, unspecified: Secondary | ICD-10-CM | POA: Diagnosis present

## 2024-01-04 DIAGNOSIS — J189 Pneumonia, unspecified organism: Secondary | ICD-10-CM | POA: Diagnosis present

## 2024-01-04 DIAGNOSIS — J9611 Chronic respiratory failure with hypoxia: Secondary | ICD-10-CM

## 2024-01-04 DIAGNOSIS — I7121 Aneurysm of the ascending aorta, without rupture: Secondary | ICD-10-CM | POA: Diagnosis present

## 2024-01-04 DIAGNOSIS — R590 Localized enlarged lymph nodes: Secondary | ICD-10-CM | POA: Diagnosis not present

## 2024-01-04 DIAGNOSIS — Z6834 Body mass index (BMI) 34.0-34.9, adult: Secondary | ICD-10-CM | POA: Diagnosis not present

## 2024-01-04 DIAGNOSIS — Z9981 Dependence on supplemental oxygen: Secondary | ICD-10-CM

## 2024-01-04 DIAGNOSIS — J9621 Acute and chronic respiratory failure with hypoxia: Secondary | ICD-10-CM | POA: Diagnosis not present

## 2024-01-04 DIAGNOSIS — Z8249 Family history of ischemic heart disease and other diseases of the circulatory system: Secondary | ICD-10-CM

## 2024-01-04 DIAGNOSIS — R519 Headache, unspecified: Secondary | ICD-10-CM | POA: Diagnosis present

## 2024-01-04 DIAGNOSIS — Z79899 Other long term (current) drug therapy: Secondary | ICD-10-CM

## 2024-01-04 DIAGNOSIS — Z87891 Personal history of nicotine dependence: Secondary | ICD-10-CM | POA: Diagnosis not present

## 2024-01-04 DIAGNOSIS — R61 Generalized hyperhidrosis: Secondary | ICD-10-CM

## 2024-01-04 DIAGNOSIS — G4733 Obstructive sleep apnea (adult) (pediatric): Secondary | ICD-10-CM | POA: Diagnosis present

## 2024-01-04 DIAGNOSIS — I2510 Atherosclerotic heart disease of native coronary artery without angina pectoris: Secondary | ICD-10-CM | POA: Diagnosis present

## 2024-01-04 DIAGNOSIS — K760 Fatty (change of) liver, not elsewhere classified: Secondary | ICD-10-CM | POA: Diagnosis present

## 2024-01-04 DIAGNOSIS — I252 Old myocardial infarction: Secondary | ICD-10-CM

## 2024-01-04 DIAGNOSIS — J432 Centrilobular emphysema: Secondary | ICD-10-CM

## 2024-01-04 DIAGNOSIS — Z7984 Long term (current) use of oral hypoglycemic drugs: Secondary | ICD-10-CM | POA: Diagnosis not present

## 2024-01-04 DIAGNOSIS — I10 Essential (primary) hypertension: Secondary | ICD-10-CM | POA: Diagnosis present

## 2024-01-04 DIAGNOSIS — J9622 Acute and chronic respiratory failure with hypercapnia: Secondary | ICD-10-CM | POA: Diagnosis not present

## 2024-01-04 DIAGNOSIS — I2721 Secondary pulmonary arterial hypertension: Secondary | ICD-10-CM | POA: Diagnosis present

## 2024-01-04 DIAGNOSIS — J440 Chronic obstructive pulmonary disease with acute lower respiratory infection: Secondary | ICD-10-CM | POA: Diagnosis present

## 2024-01-04 DIAGNOSIS — I110 Hypertensive heart disease with heart failure: Secondary | ICD-10-CM | POA: Diagnosis present

## 2024-01-04 DIAGNOSIS — I420 Dilated cardiomyopathy: Secondary | ICD-10-CM | POA: Diagnosis not present

## 2024-01-04 DIAGNOSIS — R5381 Other malaise: Secondary | ICD-10-CM | POA: Diagnosis not present

## 2024-01-04 DIAGNOSIS — I700 Atherosclerosis of aorta: Secondary | ICD-10-CM | POA: Diagnosis present

## 2024-01-04 DIAGNOSIS — R042 Hemoptysis: Secondary | ICD-10-CM

## 2024-01-04 DIAGNOSIS — Z833 Family history of diabetes mellitus: Secondary | ICD-10-CM

## 2024-01-04 DIAGNOSIS — I5042 Chronic combined systolic (congestive) and diastolic (congestive) heart failure: Secondary | ICD-10-CM | POA: Diagnosis not present

## 2024-01-04 DIAGNOSIS — R918 Other nonspecific abnormal finding of lung field: Secondary | ICD-10-CM | POA: Diagnosis not present

## 2024-01-04 DIAGNOSIS — R6510 Systemic inflammatory response syndrome (SIRS) of non-infectious origin without acute organ dysfunction: Secondary | ICD-10-CM

## 2024-01-04 DIAGNOSIS — I4510 Unspecified right bundle-branch block: Secondary | ICD-10-CM | POA: Diagnosis present

## 2024-01-04 DIAGNOSIS — I5032 Chronic diastolic (congestive) heart failure: Secondary | ICD-10-CM | POA: Diagnosis present

## 2024-01-04 DIAGNOSIS — I6782 Cerebral ischemia: Secondary | ICD-10-CM | POA: Diagnosis not present

## 2024-01-04 DIAGNOSIS — R058 Other specified cough: Secondary | ICD-10-CM | POA: Diagnosis not present

## 2024-01-04 DIAGNOSIS — Z888 Allergy status to other drugs, medicaments and biological substances status: Secondary | ICD-10-CM

## 2024-01-04 LAB — COMPREHENSIVE METABOLIC PANEL WITH GFR
ALT: 20 U/L (ref 0–44)
AST: 27 U/L (ref 15–41)
Albumin: 3.4 g/dL — ABNORMAL LOW (ref 3.5–5.0)
Alkaline Phosphatase: 89 U/L (ref 38–126)
Anion gap: 14 (ref 5–15)
BUN: 18 mg/dL (ref 8–23)
CO2: 29 mmol/L (ref 22–32)
Calcium: 8.9 mg/dL (ref 8.9–10.3)
Chloride: 95 mmol/L — ABNORMAL LOW (ref 98–111)
Creatinine, Ser: 1.3 mg/dL — ABNORMAL HIGH (ref 0.61–1.24)
GFR, Estimated: 60 mL/min — ABNORMAL LOW (ref >60)
Glucose, Bld: 169 mg/dL — ABNORMAL HIGH (ref 70–99)
Potassium: 4.1 mmol/L (ref 3.5–5.1)
Sodium: 138 mmol/L (ref 135–145)
Total Bilirubin: 0.6 mg/dL (ref 0.0–1.2)
Total Protein: 7.3 g/dL (ref 6.5–8.1)

## 2024-01-04 LAB — TROPONIN I (HIGH SENSITIVITY)
Troponin I (High Sensitivity): 34 ng/L — ABNORMAL HIGH (ref <18)
Troponin I (High Sensitivity): 34 ng/L — ABNORMAL HIGH (ref <18)

## 2024-01-04 LAB — PROTIME-INR
INR: 4.2 (ref 0.8–1.2)
Prothrombin Time: 42.3 s — ABNORMAL HIGH (ref 11.4–15.2)

## 2024-01-04 LAB — CBC WITH DIFFERENTIAL/PLATELET
Abs Immature Granulocytes: 0.08 K/uL — ABNORMAL HIGH (ref 0.00–0.07)
Basophils Absolute: 0 K/uL (ref 0.0–0.1)
Basophils Relative: 0 %
Eosinophils Absolute: 0 K/uL (ref 0.0–0.5)
Eosinophils Relative: 0 %
HCT: 42.1 % (ref 39.0–52.0)
Hemoglobin: 13.8 g/dL (ref 13.0–17.0)
Immature Granulocytes: 1 %
Lymphocytes Relative: 4 %
Lymphs Abs: 0.6 K/uL — ABNORMAL LOW (ref 0.7–4.0)
MCH: 31.5 pg (ref 26.0–34.0)
MCHC: 32.8 g/dL (ref 30.0–36.0)
MCV: 96.1 fL (ref 80.0–100.0)
Monocytes Absolute: 1.6 K/uL — ABNORMAL HIGH (ref 0.1–1.0)
Monocytes Relative: 10 %
Neutro Abs: 12.9 K/uL — ABNORMAL HIGH (ref 1.7–7.7)
Neutrophils Relative %: 85 %
Platelets: 218 K/uL (ref 150–400)
RBC: 4.38 MIL/uL (ref 4.22–5.81)
RDW: 13.8 % (ref 11.5–15.5)
WBC: 15.2 K/uL — ABNORMAL HIGH (ref 4.0–10.5)
nRBC: 0 % (ref 0.0–0.2)

## 2024-01-04 LAB — POCT INFLUENZA A/B
Influenza A, POC: NEGATIVE
Influenza B, POC: NEGATIVE

## 2024-01-04 LAB — LACTIC ACID, PLASMA: Lactic Acid, Venous: 1.1 mmol/L (ref 0.5–1.9)

## 2024-01-04 LAB — POC COVID19 BINAXNOW: SARS Coronavirus 2 Ag: NEGATIVE

## 2024-01-04 LAB — POCT INR: INR: 5.6 — AB (ref 2.0–3.0)

## 2024-01-04 LAB — PROCALCITONIN: Procalcitonin: 0.24 ng/mL

## 2024-01-04 LAB — CBG MONITORING, ED: Glucose-Capillary: 132 mg/dL — ABNORMAL HIGH (ref 70–99)

## 2024-01-04 MED ORDER — IPRATROPIUM-ALBUTEROL 0.5-2.5 (3) MG/3ML IN SOLN: 3.0000 mL | RESPIRATORY_TRACT | Status: DC | PRN

## 2024-01-04 MED ORDER — SODIUM CHLORIDE 0.9 % IV SOLN
2.0000 g | INTRAVENOUS | Status: DC
  Administered 2024-01-05: 2 g via INTRAVENOUS

## 2024-01-04 MED ORDER — WARFARIN - PHARMACIST DOSING INPATIENT
Freq: Every day | Status: DC
  Filled 2024-01-04: qty 1

## 2024-01-04 MED ORDER — SODIUM CHLORIDE 0.9 % IV SOLN
100.0000 mg | Freq: Once | INTRAVENOUS | Status: AC
  Administered 2024-01-04: 100 mg via INTRAVENOUS
  Filled 2024-01-04: qty 100

## 2024-01-04 MED ORDER — INSULIN ASPART 100 UNIT/ML IJ SOLN
0.0000 [IU] | Freq: Three times a day (TID) | INTRAMUSCULAR | Status: DC
  Filled 2024-01-04: qty 3

## 2024-01-04 MED ORDER — INSULIN ASPART 100 UNIT/ML IJ SOLN: 0.0000 [IU] | Freq: Every day | INTRAMUSCULAR | Status: DC

## 2024-01-04 MED ORDER — FLUOXETINE HCL 20 MG PO CAPS
40.0000 mg | ORAL_CAPSULE | Freq: Every day | ORAL | Status: DC
  Administered 2024-01-04 – 2024-01-05 (×2): 40 mg via ORAL
  Filled 2024-01-04 (×2): qty 2

## 2024-01-04 MED ORDER — SODIUM CHLORIDE 0.9 % IV SOLN
500.0000 mg | INTRAVENOUS | Status: DC
  Administered 2024-01-05: 500 mg via INTRAVENOUS
  Filled 2024-01-04: qty 5

## 2024-01-04 MED ORDER — GUAIFENESIN ER 600 MG PO TB12
600.0000 mg | ORAL_TABLET | Freq: Two times a day (BID) | ORAL | Status: DC
  Administered 2024-01-04 – 2024-01-05 (×2): 600 mg via ORAL
  Filled 2024-01-04 (×2): qty 1

## 2024-01-04 MED ORDER — IOHEXOL 350 MG/ML SOLN
75.0000 mL | Freq: Once | INTRAVENOUS | Status: AC | PRN
  Administered 2024-01-04: 75 mL via INTRAVENOUS

## 2024-01-04 MED ORDER — DOCUSATE SODIUM 100 MG PO CAPS
100.0000 mg | ORAL_CAPSULE | Freq: Every day | ORAL | Status: DC
  Administered 2024-01-04 – 2024-01-05 (×2): 100 mg via ORAL
  Filled 2024-01-04 (×2): qty 1

## 2024-01-04 MED ORDER — BUDESON-GLYCOPYRROL-FORMOTEROL 160-9-4.8 MCG/ACT IN AERO
2.0000 | INHALATION_SPRAY | Freq: Two times a day (BID) | RESPIRATORY_TRACT | Status: DC
  Filled 2024-01-04: qty 5.9

## 2024-01-04 MED ORDER — ACETAMINOPHEN 325 MG RE SUPP: 650.0000 mg | Freq: Four times a day (QID) | RECTAL | Status: DC | PRN

## 2024-01-04 MED ORDER — PANTOPRAZOLE SODIUM 40 MG PO TBEC
40.0000 mg | DELAYED_RELEASE_TABLET | Freq: Two times a day (BID) | ORAL | Status: DC
  Administered 2024-01-05: 40 mg via ORAL
  Filled 2024-01-04: qty 1

## 2024-01-04 MED ORDER — ONDANSETRON HCL 4 MG/2ML IJ SOLN: 4.0000 mg | Freq: Four times a day (QID) | INTRAMUSCULAR | Status: DC | PRN

## 2024-01-04 MED ORDER — ONDANSETRON HCL 4 MG PO TABS: 4.0000 mg | ORAL_TABLET | Freq: Four times a day (QID) | ORAL | Status: DC | PRN

## 2024-01-04 MED ORDER — METHOCARBAMOL 500 MG PO TABS: 500.0000 mg | ORAL_TABLET | Freq: Three times a day (TID) | ORAL | Status: DC | PRN

## 2024-01-04 MED ORDER — HYDROCODONE-ACETAMINOPHEN 5-325 MG PO TABS: 1.0000 | ORAL_TABLET | ORAL | Status: DC | PRN

## 2024-01-04 MED ORDER — FUROSEMIDE 40 MG PO TABS
80.0000 mg | ORAL_TABLET | Freq: Every day | ORAL | Status: DC
  Administered 2024-01-04 – 2024-01-05 (×2): 80 mg via ORAL
  Filled 2024-01-04 (×2): qty 2

## 2024-01-04 MED ORDER — AMIODARONE HCL 200 MG PO TABS
200.0000 mg | ORAL_TABLET | Freq: Every day | ORAL | Status: DC
  Administered 2024-01-04 – 2024-01-05 (×2): 200 mg via ORAL
  Filled 2024-01-04 (×2): qty 1

## 2024-01-04 MED ORDER — DAPAGLIFLOZIN PROPANEDIOL 10 MG PO TABS
10.0000 mg | ORAL_TABLET | Freq: Every day | ORAL | Status: DC
  Administered 2024-01-05: 10 mg via ORAL
  Filled 2024-01-04: qty 1

## 2024-01-04 MED ORDER — MONTELUKAST SODIUM 10 MG PO TABS
10.0000 mg | ORAL_TABLET | Freq: Every day | ORAL | Status: DC
  Administered 2024-01-04: 10 mg via ORAL
  Filled 2024-01-04: qty 1

## 2024-01-04 MED ORDER — LATANOPROST 0.005 % OP SOLN
1.0000 [drp] | Freq: Every day | OPHTHALMIC | Status: DC
  Filled 2024-01-04: qty 2.5

## 2024-01-04 MED ORDER — MORPHINE SULFATE (PF) 2 MG/ML IV SOLN: 2.0000 mg | INTRAVENOUS | Status: DC | PRN

## 2024-01-04 MED ORDER — SODIUM CHLORIDE 0.9 % IV SOLN
1.0000 g | Freq: Once | INTRAVENOUS | Status: AC
  Administered 2024-01-04: 1 g via INTRAVENOUS
  Filled 2024-01-04: qty 10

## 2024-01-04 MED ORDER — ACETAMINOPHEN 325 MG PO TABS: 650.0000 mg | ORAL_TABLET | Freq: Four times a day (QID) | ORAL | Status: DC | PRN

## 2024-01-04 MED ORDER — METOLAZONE 2.5 MG PO TABS: 2.5000 mg | ORAL_TABLET | ORAL | Status: DC

## 2024-01-04 MED ORDER — SIMVASTATIN 10 MG PO TABS
20.0000 mg | ORAL_TABLET | Freq: Every evening | ORAL | Status: DC
  Administered 2024-01-04: 20 mg via ORAL
  Filled 2024-01-04: qty 2

## 2024-01-04 MED ORDER — POTASSIUM CHLORIDE CRYS ER 20 MEQ PO TBCR
10.0000 meq | EXTENDED_RELEASE_TABLET | Freq: Every day | ORAL | Status: DC
  Administered 2024-01-04 – 2024-01-05 (×2): 10 meq via ORAL
  Filled 2024-01-04 (×2): qty 1

## 2024-01-04 NOTE — Hospital Course (Signed)
 [name]

## 2024-01-04 NOTE — Patient Instructions (Signed)
Pre visit review using our clinic review tool, if applicable. No additional management support is needed unless otherwise documented below in the visit note. 

## 2024-01-04 NOTE — Assessment & Plan Note (Signed)
 Stable findings-recommendations for follow-up per radiology-see report

## 2024-01-04 NOTE — ED Notes (Signed)
Placed on 2lnc

## 2024-01-04 NOTE — Assessment & Plan Note (Signed)
 Appears chronic and recurrent Currently on Tylenol  and methocarbamol  which we will continue

## 2024-01-04 NOTE — Assessment & Plan Note (Addendum)
 Acute illness with fever Tmax 102.1 in office, with R sided headache and R maxillary facial pain, fatigue, shortness of breath and now coughing up blood tinged mucous. Presented with marked hypoxia to 82% off oxygen . Oxygen  does improve to 92% on 2L [place].  Flu and COVID swabs today negative Lung exam overall reassuring.  Anticipate cough tinged sputum due to elevated INR - rec hold coumadin  at this time until ER evaluation, further recommendations to come through coumadin  clinic.  Possible acute sinusitis however given associated symptoms and acutely ill appearance, I did recommend further evaluation in ER today. Recommended EMS transport - he refuses. States he will have friend drive him to ER today.

## 2024-01-04 NOTE — Assessment & Plan Note (Signed)
 Sliding scale insulin  coverage Will hold glimepiride , metformin , semaglutide 

## 2024-01-04 NOTE — Assessment & Plan Note (Signed)
 Dilated cardiomyopathy Clinically euvolemic Continue Lasix  and metolazone  Monitor for fluid overload being hydrated

## 2024-01-04 NOTE — Assessment & Plan Note (Signed)
 Previously only on nocturnal oxygen  supplementation in untreated OSA (CPAP intolerance).  New oxygen  requirement - with fever and acutely ill appearing, recommend ER eval r/o PNA or other cause.

## 2024-01-04 NOTE — H&P (Signed)
 History and Physical    PatientBETHA [name] [name] FMW:[id number] DOB: June 21, 1955 DOA: 01/04/2024 DOS: the patient was seen and examined on 01/04/2024 PCP: [name] Baller, MD  Patient coming from: Home  Chief Complaint:  Chief Complaint  Patient presents with   Hemoptysis    HPI: [name] [name] is a 68 y.o. male with medical history significant for COPD and OSA on nocturnal oxygen  (CPAP intolerant), DM, , A-fib on Coumadin  and amiodarone , diastolic CHF, CAD with history of MI, depression, being admitted with right upper lobe pneumonia.  He presented with a 2-day history of cough productive of white phlegm intermittently blood-tinged.  He has associated shortness of breath and had a fever at home.  He was seen at his PCP earlier in the day where he was hypoxic to 82% and febrile to 102.1.  He was placed on O2 at 2 L with improvement to 92 and sent to the ER for further evaluation.  Flu and COVID swabs were negative.  INR in the office was 5.6. Additionally, patient complained of a right sided headache which she states has has been off and on for several months  In the ED, Tmax 99.2 with pulse 83, respirations 22 with O2 sat 87%, improving to 94% on 2 L, BP 144/75. Labs pertinent for WBC 15,000 and lactic acid 1.1.  First troponin 34.Of  Other labs notable for creatinine of 1.3 near his baseline of about 1.21. EKG, personally viewed and interpreted shows sinus at 80 with RBBBImaging was negative for PE but showed a dense right upper lobe pneumonia with recommendation to repeat chest x-ray in 3 to 4 weeks to ensure resolution and exclude underlying malignancy.  Also showed findings compatible with pulmonary arterial hypertension and a stable 4.2 cm ascending thoracic aortic aneurysm. Head CT nonacute Patient started on ceftriaxone  and doxycycline  Admission requested.     Review of Systems: As mentioned in the history of present illness. All other systems reviewed and are negative.  Past  Medical History:  Diagnosis Date   Adrenal nodule (HCC) 12/28/2021   a.) CT abd/pel 12/28/2021: LEFT adrenal nodule measuring 1.6 cm   Anemia    Angina    Anxiety    Aortic atherosclerosis (HCC)    Arthritis    Atrial fibrillation and flutter (HCC) 2013   a.) CHA2DS2VASc = 5 (age, CHF, HTN, vascular disease history, T2DM);  b.) s/p DCCV (100 J) 10/27/2010; c.) s/p ablation 09/21/2011; d.) rate/rhythm maintained on oral amiodarone  + carvedilol ; chronically anticoagulated with warfarin   CAD (coronary artery disease) 07/15/2010   a.) LHC 07/15/2010: 30% pLAD, 40% D1, 25%/30% pLCx, 30% mLCx - med mgmt   CHF (congestive heart failure) - NYHA Class II/III 2013   a.) TTE 02/26/2013: EF 50%, mild MR/TR; b.) EST 06/13/2017: EF 50%, mild LCH, triv MR/TR; c.) TTTE 03/24/2020: EF >55%, mild LVH, mild MAC, mild BAE, triv PR, mild MR/TR, G1DD; d.) TTE 11/19/2021: EF 60-65%, mod reduced RVSF, mild LAE, mod MAC, Ao root 37 mm, G2DD   COPD (chronic obstructive pulmonary disease) (HCC)    Diabetes type 2, uncontrolled    declines DSME   Dilated cardiomyopathy (HCC) 2013   a.) EF 25% at time of Dx in 2013; b.) TTE 02/26/2013: EF 50%; c.) EST 06/13/2017: EF 50%;  d.) TTE 03/24/2020: EF >55%; e.) TTE 11/19/2021: EF 60-65%   Ex-smoker    GERD (gastroesophageal reflux disease)    Hepatic steatosis    Hyperlipidemia    Hypertension  Infrarenal abdominal aortic aneurysm (AAA) without rupture (HCC) 08/11/2016   a.) CT abd/pel 08/11/2016: 3.3 cm; b.) CT abd/pel 12/22/2018: 3.3 cm; c.) CT abd/pel 08/31/2021: 3.7 cm; d.) CT abd/pel 12/28/2021: 3.9 cm   Long term current use of amiodarone  2023   a.) previously on dranaderone (cost prohibitive); switched to amiodarone  2023   Long term current use of anticoagulant    a.) warfarin   Marijuana use    a.) UDS (+) of THC x 3 (02/06/2013, 06/29/2013, 09/03/2013); PCP advised one more and we will stop prescribing ativan  (08/2013)   Migraine    Narcolepsy    Obesity     OSA (obstructive sleep apnea)    a.) unable to tolerate nocturnal PAP therapy; uses supplemetal oxygen  at 2L/[place] during sleep   Seasonal allergies    Umbilical hernia    Past Surgical History:  Procedure Laterality Date   ATRIAL FLUTTER ABLATION N/A 09/21/2011   Procedure: ATRIAL FLUTTER ABLATION;  Surgeon: Lynwood Rakers, MD;  Location: [facility] CATH LAB;  Service: Cardiovascular;  Laterality: N/A;   CARDIAC ELECTROPHYSIOLOGY MAPPING AND ABLATION  2013   for atrial flutter   CARDIOVASCULAR STRESS TEST  03/2012   ETT WNL Cl[place])   CARDIOVERSION N/A 08/12/2021   Procedure: CARDIOVERSION;  Surgeon: Hester Wolm PARAS, MD;  Location: [facility] ORS;  Service: Cardiovascular;  Laterality: N/A;   COLONOSCOPY WITH PROPOFOL  N/A 05/22/2020   TA, diverticulosis, descending colon ulcer biopsy WNL, int hem Renne, Darren, MD)   COLONOSCOPY WITH PROPOFOL  N/A 04/28/2022   multiple TAs - 10 small, 2 1.2cm, 2 9mm, rpt 3 yrs (Vanga, Corinn Skiff, MD)   CYST EXCISION N/A 08/05/2022   excision of back cysts x 2 (pathology returned epidermal inclusion cysts);  Surgeon: Desiderio Schanz, MD   ESOPHAGOGASTRODUODENOSCOPY (EGD) WITH PROPOFOL  N/A 05/22/2020   WNL (Wohl)   ESOPHAGOGASTRODUODENOSCOPY (EGD) WITH PROPOFOL  N/A 04/28/2022   12mm duodenal TA, reactive gastropathy, enteric mucosa in stomach biopsy of unclear significance (Vanga, Corinn Skiff, MD)   GIVENS CAPSULE STUDY  04/28/2022   Procedure: GIVENS CAPSULE STUDY;  Surgeon: Unk Corinn Skiff, MD;  Location: [facility] ENDOSCOPY;  Service: Endoscopy;;   US  ECHOCARDIOGRAPHY  2014   EF 50%, nl LV fxn, RV nl size/function, mild mitral insuff   Social History:  reports that he quit smoking about 5 years ago. His smoking use included cigarettes. He started smoking about 36 years ago. He has a 31 pack-year smoking history. He has been exposed to tobacco smoke. He has never used smokeless tobacco. He reports current drug use. Drug: Marijuana. He reports that he does not  drink alcohol.  Allergies  Allergen Reactions   Atorvastatin Other (See Comments)    Dizziness   Lisinopril Cough    Family History  Problem Relation Age of Onset   Heart failure Father 74   Cancer Mother 22       NHL   Hypertension Maternal Grandfather    CAD Maternal Grandfather 67       MI   Diabetes Other        grandparents   Cancer Brother 12       lung (smoker)    Prior to Admission medications   Medication Sig Start Date End Date Taking? Authorizing Provider  acetaminophen  (TYLENOL ) 500 MG tablet Take 2 tablets (1,000 mg total) by mouth every 6 (six) hours as needed for mild pain. 08/05/22   Desiderio Schanz, MD  albuterol  (VENTOLIN  HFA) 108 (90 Base) MCG/ACT inhaler Inhale 2 puffs into  the lungs every 6 (six) hours as needed for wheezing or shortness of breath. 01/12/22   [name] Baller, MD  amiodarone  (PACERONE ) 200 MG tablet Take 1 tablet (200 mg total) by mouth daily. 09/16/21   Cindie Ole DASEN, MD  Blood Glucose Monitoring Suppl (ACCU-CHEK GUIDE ME) w/Device KIT 2 (two) times daily. 11/29/23   [provider]  Blood Glucose Monitoring Suppl DEVI 1 each by Does not apply route in the morning, at noon, and at bedtime. May substitute to any manufacturer covered by patient's insurance. 11/29/23   [name] Baller, MD  budesonide -glycopyrrolate -formoterol  (BREZTRI  AEROSPHERE) 160-9-4.8 MCG/ACT AERO inhaler Inhale 2 puffs into the lungs 2 (two) times daily. 12/07/23   [name] Baller, MD  Cholecalciferol  (VITAMIN D ) 50 MCG (2000 UT) CAPS Take 1 capsule (2,000 Units total) by mouth daily. 02/05/19   [name] Baller, MD  dapagliflozin  propanediol (FARXIGA ) 10 MG TABS tablet Take 1 tablet (10 mg total) by mouth daily before breakfast. 03/01/22   Donette Ellouise LABOR, FNP  Docusate Calcium (STOOL SOFTENER PO) Take 1 tablet by mouth daily.    [provider]  FLUoxetine  (PROZAC ) 40 MG capsule TAKE 1 CAPSULE (40 MG TOTAL) BY MOUTH DAILY. 12/29/23   [name] Baller, MD   fluticasone  (FLONASE ) 50 MCG/ACT nasal spray Place 2 sprays into both nostrils daily. Patient taking differently: Place 2 sprays into both nostrils as needed. 01/12/22   [name] Baller, MD  furosemide  (LASIX ) 80 MG tablet Take 1 tablet (80 mg total) by mouth daily. With second dose as needed for fluid retention, leg swelling 08/16/23   [name] Baller, MD  glimepiride  (AMARYL ) 4 MG tablet Take 0.5 tablets (2 mg total) by mouth daily with breakfast. 12/19/23   [name] Baller, MD  Glucose Blood (BLOOD GLUCOSE TEST STRIPS) STRP 1 each by In Vitro route in the morning, at noon, and at bedtime. May substitute to any manufacturer covered by patient's insurance. 11/29/23 03/08/24  [name] Baller, MD  ipratropium-albuterol  (DUONEB) 0.5-2.5 (3) MG/3ML SOLN TAKE 3 MLS BY NEBULIZATION EVERY 6 (SIX) HOURS AS NEEDED (SEVERE SHORTNESS OF BREATH/WHEEZING). 05/31/22   [name] Baller, MD  Lancet Device MISC 1 each by Does not apply route in the morning, at noon, and at bedtime. May substitute to any manufacturer covered by patient's insurance. 11/29/23 02/27/24  [name] Baller, MD  Lancets Misc. MISC 1 each by Does not apply route in the morning, at noon, and at bedtime. May substitute to any manufacturer covered by patient's insurance. 11/29/23 02/27/24  [name] Baller, MD  latanoprost  (XALATAN ) 0.005 % ophthalmic solution Place 1 drop into both eyes at bedtime. 12/19/23   [name] Baller, MD  metFORMIN  (GLUCOPHAGE ) 500 MG tablet Take 1 tablet (500 mg total) by mouth daily with breakfast. 11/14/23   [name] Baller, MD  methocarbamol  (ROBAXIN ) 500 MG tablet Take 1 tablet (500 mg total) by mouth 3 (three) times daily as needed for muscle spasms. 10/04/23   [name] Baller, MD  metolazone  (ZAROXOLYN ) 2.5 MG tablet Take 1 tablet (2.5 mg total) by mouth once a week. As needed for leg swelling 11/16/23   [name] Baller, MD  montelukast  (SINGULAIR ) 10 MG tablet Take 1 tablet (10 mg total) by mouth at bedtime.  10/04/23   [name] Baller, MD  OXYGEN  Inhale 2 L into the lungs at bedtime.    [provider]  pantoprazole  (PROTONIX ) 40 MG tablet Take 1 tablet (40 mg total) by mouth 2 (two) times daily before a meal. 10/04/23   [name] Baller, MD  potassium chloride  (  MICRO-K ) 10 MEQ CR capsule Take 1 capsule (10 mEq total) by mouth daily. 10/04/23   [name] Baller, MD  Semaglutide ,0.25 or 0.5MG /DOS, (OZEMPIC , 0.25 OR 0.5 MG/DOSE,) 2 MG/1.5ML SOPN Inject 0.5 mg into the skin once a week. 01/09/24   [name] Baller, MD  Semaglutide ,0.25 or 0.5MG /DOS, (OZEMPIC , 0.25 OR 0.5 MG/DOSE,) 2 MG/3ML SOPN Inject 0.25 mg into the skin once a week. 12/12/23   [name] Baller, MD  simvastatin  (ZOCOR ) 20 MG tablet Take 1 tablet (20 mg total) by mouth every evening. 10/04/23   [name] Baller, MD  triamcinolone  cream (KENALOG ) 0.1 % Apply 1 Application topically 2 (two) times daily. For no more than 10 days at a time 09/22/22   [name] Baller, MD  vitamin B-12 (CYANOCOBALAMIN ) 1000 MCG tablet Take 1 tablet (1,000 mcg total) by mouth every Monday, Wednesday, and Friday. 07/22/21   [name] Baller, MD  warfarin (COUMADIN ) 5 MG tablet TAKE 1 TABLET BY MOUTH DAILY EXCEPT TAKE 1/2 TABLET ON MONDAY, WEDNESDAY AND SATURDAY OR AS DIRECTED BY ANTICOAGULATION CLINIC 10/04/23   [name] Baller, MD    Physical Exam: Vitals:   01/04/24 1343 01/04/24 1353 01/04/24 1805 01/04/24 1816  BP:   (!) 148/86   Pulse:   79   Resp:   (!) 25   Temp:    99.7 F (37.6 C)  TempSrc:    Oral  SpO2:  94% 99%   Weight: 106.5 kg      Physical Exam Vitals and nursing note reviewed.  Constitutional:      General: He is not in acute distress.    Comments: Patient sitting up in chair beside bed leaning forward onto bed.  Does not appear to be in distress  HENT:     Head: Normocephalic and atraumatic.  Cardiovascular:     Rate and Rhythm: Normal rate and regular rhythm.     Heart sounds: Normal heart sounds.  Pulmonary:      Effort: Tachypnea present.     Breath sounds: Normal breath sounds.  Abdominal:     Palpations: Abdomen is soft.     Tenderness: There is no abdominal tenderness.  Neurological:     Mental Status: Mental status is at baseline.     Labs on Admission: I have personally reviewed following labs and imaging studies  CBC: Recent Labs  Lab 01/04/24 1349  WBC 15.2*  NEUTROABS 12.9*  HGB 13.8  HCT 42.1  MCV 96.1  PLT 218   Basic Metabolic Panel: Recent Labs  Lab 01/04/24 1349  NA 138  K 4.1  CL 95*  CO2 29  GLUCOSE 169*  BUN 18  CREATININE 1.30*  CALCIUM 8.9   GFR: Estimated Creatinine Clearance: 65.4 mL/min (A) (by C-G formula based on SCr of 1.3 mg/dL (H)). Liver Function Tests: Recent Labs  Lab 01/04/24 1349  AST 27  ALT 20  ALKPHOS 89  BILITOT 0.6  PROT 7.3  ALBUMIN 3.4*   No results for input(s): LIPASE, AMYLASE in the last 168 hours. No results for input(s): AMMONIA in the last 168 hours. Coagulation Profile: Recent Labs  Lab 01/04/24 0000  INR 5.6*   Cardiac Enzymes: No results for input(s): CKTOTAL, CKMB, CKMBINDEX, TROPONINI in the last 168 hours. BNP (last 3 results) Recent Labs    05/31/23 1254  PROBNP 134.0*   HbA1C: No results for input(s): HGBA1C in the last 72 hours. CBG: No results for input(s): GLUCAP in the last 168 hours. Lipid Profile: No results for input(s): CHOL, HDL, LDLCALC,  TRIG, CHOLHDL, LDLDIRECT in the last 72 hours. Thyroid  Function Tests: No results for input(s): TSH, T4TOTAL, FREET4, T3FREE, THYROIDAB in the last 72 hours. Anemia Panel: No results for input(s): VITAMINB12, FOLATE, FERRITIN, TIBC, IRON , RETICCTPCT in the last 72 hours. Urine analysis:    Component Value Date/Time   COLORURINE YELLOW (A) 01/05/2022 0244   APPEARANCEUR CLEAR (A) 01/05/2022 0244   APPEARANCEUR Clear 02/19/2012 1128   LABSPEC 1.023 01/05/2022 0244   LABSPEC 1.010 02/19/2012 1128    PHURINE 6.0 01/05/2022 0244   GLUCOSEU >=500 (A) 01/05/2022 0244   GLUCOSEU Negative 02/19/2012 1128   HGBUR NEGATIVE 01/05/2022 0244   BILIRUBINUR neg 12/16/2023 0807   BILIRUBINUR Negative 02/19/2012 1128   KETONESUR NEGATIVE 01/05/2022 0244   PROTEINUR Negative 12/16/2023 0807   PROTEINUR 30 (A) 01/05/2022 0244   UROBILINOGEN 0.2 12/16/2023 0807   NITRITE neg 12/16/2023 0807   NITRITE NEGATIVE 01/05/2022 0244   LEUKOCYTESUR Negative 12/16/2023 0807   LEUKOCYTESUR NEGATIVE 01/05/2022 0244   LEUKOCYTESUR Negative 02/19/2012 1128    Radiological Exams on Admission: CT Angio Chest PE W/Cm &/Or Wo Cm Result Date: 01/04/2024 CLINICAL DATA:  Headache, productive cough for 1 day, blood tinged sputum, elevated INR, hypoxia EXAM: CT ANGIOGRAPHY CHEST WITH CONTRAST TECHNIQUE: Multidetector CT imaging of the chest was performed using the standard protocol during bolus administration of intravenous contrast. Multiplanar CT image reconstructions and MIPs were obtained to evaluate the vascular anatomy. RADIATION DOSE REDUCTION: This exam was performed according to the departmental dose-optimization program which includes automated exposure control, adjustment of the mA and/or kV according to patient size and/or use of iterative reconstruction technique. CONTRAST:  75mL OMNIPAQUE  IOHEXOL  350 MG/ML SOLN COMPARISON:  01/04/2024, 09/07/2022 FINDINGS: Cardiovascular: This is a technically adequate evaluation of the pulmonary vasculature. No filling defects or pulmonary emboli. Dilated main pulmonary artery consistent with pulmonary arterial hypertension. The heart is enlarged without pericardial effusion. Stable 4.2 cm ascending thoracic aortic aneurysm. Atherosclerosis of the aorta and coronary vasculature. Mediastinum/Nodes: Stable benign calcified anterior mediastinal cyst, either a pericardial or thymic origin. Enlarged right hilar lymph nodes are likely reactive. No mediastinal adenopathy. Thyroid , trachea,  and esophagus are unremarkable. Lungs/Pleura: Dense right upper lobe perihilar consolidation is again identified, most consistent with pneumonia. Linear consolidation at the right lung base compatible with atelectasis. The left chest is clear. No effusion or pneumothorax. The central airways are patent. Upper Abdomen: No acute abnormality. Musculoskeletal: No acute or destructive bony abnormalities. Reconstructed images demonstrate no additional findings. Review of the MIP images confirms the above findings. IMPRESSION: 1. No evidence of pulmonary embolus. 2. Dense right upper lobe airspace disease consistent with pneumonia. Followup PA and lateral chest X-ray is recommended in 3-4 weeks following trial of antibiotic therapy to ensure resolution and exclude underlying malignancy. 3. Right hilar adenopathy, likely reactive. 4. Cardiomegaly. 5. Dilated main pulmonary artery compatible with pulmonary arterial hypertension. 6. Stable 4.2 cm ascending thoracic aortic aneurysm. Recommend annual imaging followup by CTA or MRA. This recommendation follows 2010 ACCF/AHA/AATS/ACR/ASA/SCA/SCAI/SIR/STS/SVM Guidelines for the Diagnosis and Management of Patients with Thoracic Aortic Disease. Circulation. 2010; 121: Z733-z630. Aortic aneurysm NOS (ICD10-I71.9) 7. Aortic Atherosclerosis (ICD10-I70.0). Coronary artery atherosclerosis. Electronically Signed   By: Ozell Daring M.D.   On: 01/04/2024 19:22   CT Head Wo Contrast Result Date: 01/04/2024 CLINICAL DATA:  Headache, productive cough EXAM: CT HEAD WITHOUT CONTRAST TECHNIQUE: Contiguous axial images were obtained from the base of the skull through the vertex without intravenous contrast. RADIATION DOSE REDUCTION: This exam was  performed according to the departmental dose-optimization program which includes automated exposure control, adjustment of the mA and/or kV according to patient size and/or use of iterative reconstruction technique. COMPARISON:  10/25/2023 FINDINGS:  Brain: Chronic small vessel ischemic changes are seen within the periventricular and subcortical white matter. No acute infarct or hemorrhage. Lateral ventricles and midline structures appear unremarkable. No acute extra-axial fluid collections. No mass effect. Vascular: No hyperdense vessel or unexpected calcification. Skull: Normal. Negative for fracture or focal lesion. Sinuses/Orbits: No acute finding. Other: None. IMPRESSION: 1. Stable head CT, no acute intracranial process. Electronically Signed   By: Ozell Daring M.D.   On: 01/04/2024 19:16   DG Chest 2 View Result Date: 01/04/2024 CLINICAL DATA:  Hypoxia, productive cough for 1 day. EXAM: CHEST - 2 VIEW COMPARISON:  July 26, 2022. FINDINGS: Stable cardiomediastinal silhouette. Left lung is clear. Right upper lobe opacity is noted most consistent with pneumonia. Mild right basilar atelectasis or infiltrate is noted. Bony thorax is unremarkable. IMPRESSION: Right upper lobe opacity is noted most consistent with pneumonia. Mild right basilar subsegmental atelectasis or infiltrate is noted. Followup PA and lateral chest X-ray is recommended in 3-4 weeks following trial of antibiotic therapy to ensure resolution and exclude underlying malignancy. Electronically Signed   By: Lynwood Landy Raddle M.D.   On: 01/04/2024 14:22   Data Reviewed for HPI: Relevant notes from primary care and specialist visits, past discharge summaries as available in EHR, including Care Everywhere. Prior diagnostic testing as pertinent to current admission diagnoses Updated medications and problem lists for reconciliation ED course, including vitals, labs, imaging, treatment and response to treatment Triage notes, nursing and pharmacy notes and ED provider's notes Notable results as noted above in HPI      Assessment and Plan: * Right upper lobe pneumonia Hemoptysis, in the setting of supratherapeutic INR of 5.6 Acute on chronic respiratory failure with hypoxia SIRS,  unlikely sepsis Patient presents with cough, blood-tinged sputum and shortness of breath with increasing O2 requirement.  O2 sats 82% at PCP with temp 102.1, WBC 15,000 CT chest showing dense right upper lobe pneumonia with recommendation to repeat chest x-ray in 3 to 4 weeks to ensure resolution and rule out underlying malignancy - Rocephin  and azithromycin  -Continue supplemental oxygen  to keep sats over 94% - Antitussives, incentive spirometer, DuoNebs as needed -Monitor INR.- Pharmacy consulted to manage, however will continue to hold if hemoptysis worsens    Paroxysmal atrial fibrillation (HCC) Supratherapeutic INR Continue amiodarone  Pharmacy consult for management of Coumadin  No reversal for supratherapeutic [place] INR unless evidence of worsening abscesses  COPD (chronic obstructive pulmonary disease) (HCC) Not acutely exacerbated Continue home inhalers, with DuoNebs as needed  Right-sided headache Appears chronic and recurrent Currently on Tylenol  and methocarbamol  which we will continue  HFrEF with improved EF (previously 25%, most recently 55% 12/2022) Dilated cardiomyopathy Clinically euvolemic Continue Lasix  and metolazone  Monitor for fluid overload being hydrated  CAD with history of MI No complaints of chest pain Continue simvastatin .  Currently on Coumadin  not on antiplatelet Not on beta-blocker likely due to being on amiodarone   Obstructive sleep apnea CPAP intolerant and on nocturnal oxygen   Thoracic ascending aortic aneurysm (HCC) Stable findings-recommendations for follow-up per radiology-see report  Hypertension BP currently stable, 144/75 on admission Will continue furosemide  and metolazone   Type 2 diabetes mellitus with other specified complication (HCC) Sliding scale insulin  coverage Will hold glimepiride , metformin , semaglutide         DVT prophylaxis: coumadin   Consults: none  Advance Care  Planning:   Code Status: Prior   Family  Communication: none  Disposition Plan: Back to previous home environment  Severity of Illness: The appropriate patient status for this patient is INPATIENT. Inpatient status is judged to be reasonable and necessary in order to provide the required intensity of service to ensure the patient's safety. The patient's presenting symptoms, physical exam findings, and initial radiographic and laboratory data in the context of their chronic comorbidities is felt to place them at high risk for further clinical deterioration. Furthermore, it is not anticipated that the patient will be medically stable for discharge from the hospital within 2 midnights of admission.   * I certify that at the point of admission it is my clinical judgment that the patient will require inpatient hospital care spanning beyond 2 midnights from the point of admission due to high intensity of service, high risk for further deterioration and high frequency of surveillance required.*  Author: Delayne LULLA Solian, MD 01/04/2024 8:37 PM  For on call review [email or web address].

## 2024-01-04 NOTE — ED Provider Notes (Signed)
 [facility] Provider Note    Event Date/Time   First MD Initiated Contact with Patient 01/04/24 1726     (approximate)   History   Hemoptysis   HPI  [name] is a 68 y.o. male with history of diabetes, COPD, CHF, CAD, A-fib on Coumadin  presenting with productive cough for a day.  Noted that his sputum was blood-tinged.  States that that resolved this morning, has been having cough but no blood-tinged sputum anymore.  Was also noted to be hypoxic at the clinic.  Also notes a right sided headache.  Headache has been ongoing for months.  Is not new, not associate with any vision changes or weakness or numbness or slurred speech, no facial droop.  No trauma or falls.  He denies any chest pain or new leg swelling.  Last took his Coumadin  last night.  On independent review, he had an INR obtained at INR clinic, noted to be 5.6 today.  Also seen by primary care this morning, noted to have a Tmax of 102.1 in the office, also complaining about right sided headache and right facial pain with shortness of breath, fatigue, coughing up blood-tinged mucus.  Noted to be hypoxic to 82% off oxygen .  Had a flu and COVID swab done today that was negative.  He was found to have an elevated INR and was told to hold his Coumadin  until he gets evaluated.     Physical Exam   Triage Vital Signs: ED Triage Vitals  Encounter Vitals Group     BP 01/04/24 1342 (!) 144/75     Girls Systolic BP Percentile --      Girls Diastolic BP Percentile --      Boys Systolic BP Percentile --      Boys Diastolic BP Percentile --      Pulse Rate 01/04/24 1342 83     Resp 01/04/24 1342 (!) 22     Temp 01/04/24 1342 99.2 F (37.3 C)     Temp Source 01/04/24 1342 Oral     SpO2 01/04/24 1342 (!) 87 %     Weight 01/04/24 1343 234 lb 12.6 oz (106.5 kg)     Height --      Head Circumference --      Peak Flow --      Pain Score 01/04/24 1342 10     Pain Loc --      Pain Education --       Exclude from Growth Chart --     Most recent vital signs: Vitals:   01/04/24 1805 01/04/24 1816  BP: (!) 148/86   Pulse: 79   Resp: (!) 25   Temp:  99.7 F (37.6 C)  SpO2: 99%      General: Awake, no distress.  CV:  Good peripheral perfusion.  Resp:  Normal effort.  Clear Abd:  No distention.  Soft nontender Other:  Bilateral lower extremity edema that appears symmetrical, no focal weakness or numbness, he has equal pupils, no facial droop or slurred speech.   ED Results / Procedures / Treatments   Labs (all labs ordered are listed, but only abnormal results are displayed) Labs Reviewed  COMPREHENSIVE METABOLIC PANEL WITH GFR - Abnormal; Notable for the following components:      Result Value   Chloride 95 (*)    Glucose, Bld 169 (*)    Creatinine, Ser 1.30 (*)    Albumin 3.4 (*)    GFR, Estimated 60 (*)  All other components within normal limits  CBC WITH DIFFERENTIAL/PLATELET - Abnormal; Notable for the following components:   WBC 15.2 (*)    Neutro Abs 12.9 (*)    Lymphs Abs 0.6 (*)    Monocytes Absolute 1.6 (*)    Abs Immature Granulocytes 0.08 (*)    All other components within normal limits  TROPONIN I (HIGH SENSITIVITY) - Abnormal; Notable for the following components:   Troponin I (High Sensitivity) 34 (*)    All other components within normal limits  TROPONIN I (HIGH SENSITIVITY) - Abnormal; Notable for the following components:   Troponin I (High Sensitivity) 34 (*)    All other components within normal limits  LACTIC ACID, PLASMA  URINALYSIS, W/ REFLEX TO CULTURE (INFECTION SUSPECTED)  PROTIME-INR     EKG  EKG shows, EKG shows junctional rhythm, rate 79, chronic QRS, normal QTc, right bundle branch block, no obvious ischemic ST elevation, T wave inversion to V3, on prior EKG, he was noted to have junctional rhythm in the past as noted by cardiology as well.  This is not new.   RADIOLOGY On my independent interpretation, chest x-ray shows an  opacity consistent with pneumonia.   PROCEDURES:  Critical Care performed: Yes, see critical care procedure note(s)  .Critical Care  Performed by: Waymond Lorelle Cummins, MD Authorized by: Waymond Lorelle Cummins, MD   Critical care provider statement:    Critical care time (minutes):  40   Critical care was necessary to treat or prevent imminent or life-threatening deterioration of the following conditions:  Respiratory failure   Critical care was time spent personally by me on the following activities:  Development of treatment plan with patient or surrogate, discussions with consultants, evaluation of patient's response to treatment, examination of patient, ordering and review of laboratory studies, ordering and review of radiographic studies, ordering and performing treatments and interventions, pulse oximetry, re-evaluation of patient's condition and review of old charts    MEDICATIONS ORDERED IN ED: Medications  doxycycline  (VIBRAMYCIN ) 100 mg in sodium chloride  0.9 % 250 mL IVPB (100 mg Intravenous New Bag/Given 01/04/24 1839)  cefTRIAXone  (ROCEPHIN ) 1 g in sodium chloride  0.9 % 100 mL IVPB (0 g Intravenous Stopped 01/04/24 1840)  iohexol  (OMNIPAQUE ) 350 MG/ML injection 75 mL (75 mLs Intravenous Contrast Given 01/04/24 1900)     IMPRESSION / MDM / ASSESSMENT AND PLAN / ED COURSE  I reviewed the triage vital signs and the nursing notes.                              Differential diagnosis includes, but is not limited to, pneumonia, viral illness, PE, alveolar hemorrhage.  For his headache it appears old, could be musculoskeletal strain, tension headache, migraine, given his elevated INR, will get a CT head to make sure there is no intracranial hemorrhage that is new.  Will get labs, EKG, troponin, chest x-ray, will repeat PT/INR.  Get a CT PE study as well.  Patient's presentation is most consistent with acute presentation with potential threat to life or bodily function.  Independent interpretation  of labs and imaging below.  Chest x-ray shows a pneumonia, start him of IV ceftriaxone  and doxycycline .  He will need to be admitted for further management of his hypoxia as well as his pneumonia.  Consult the hospitalist was agreeable with the plan for admission and will evaluate the patient.  He is admitted.  The patient is on the cardiac  monitor to evaluate for evidence of arrhythmia and/or significant heart rate changes.   Clinical Course as of 01/04/24 2024  Wed Jan 04, 2024  1727 DG Chest 2 View IMPRESSION: Right upper lobe opacity is noted most consistent with pneumonia. Mild right basilar subsegmental atelectasis or infiltrate is noted. Followup PA and lateral chest X-ray is recommended in 3-4 weeks following trial of antibiotic therapy to ensure resolution and exclude underlying malignancy.   [TT]  1733 Independent review of labs, he has leukocytosis, electrolytes not severely deranged, LFTs are normal, creatinine is mildly elevated, has been elevated in the past.  H&H is stable. [TT]  1929 CT Angio Chest PE W/Cm &/Or Wo Cm IMPRESSION: 1. No evidence of pulmonary embolus. 2. Dense right upper lobe airspace disease consistent with pneumonia. Followup PA and lateral chest X-ray is recommended in 3-4 weeks following trial of antibiotic therapy to ensure resolution and exclude underlying malignancy. 3. Right hilar adenopathy, likely reactive. 4. Cardiomegaly. 5. Dilated main pulmonary artery compatible with pulmonary arterial hypertension. 6. Stable 4.2 cm ascending thoracic aortic aneurysm. Recommend annual imaging followup by CTA or MRA. This recommendation follows 2010 ACCF/AHA/AATS/ACR/ASA/SCA/SCAI/SIR/STS/SVM Guidelines for the Diagnosis and Management of Patients with Thoracic Aortic Disease. Circulation. 2010; 121: [id number]. Aortic aneurysm NOS (ICD10-I71.9) 7. Aortic Atherosclerosis (ICD10-I70.0). Coronary artery   [TT]  1930 CT Head Wo Contrast 1. Stable head CT,  no acute intracranial process.  [TT]    Clinical Course User Index [TT] Waymond Lorelle Cummins, MD     FINAL CLINICAL IMPRESSION(S) / ED DIAGNOSES   Final diagnoses:  Pneumonia due to infectious organism, unspecified laterality, unspecified part of lung  Hypoxia  Blood-tinged sputum     Rx / DC Orders   ED Discharge Orders     None        Note:  This document was prepared using Dragon voice recognition software and may include unintentional dictation errors.    Waymond Lorelle Cummins, MD 01/04/24 2024

## 2024-01-04 NOTE — Assessment & Plan Note (Signed)
 BP currently stable, 144/75 on admission Will continue furosemide  and metolazone 

## 2024-01-04 NOTE — Assessment & Plan Note (Addendum)
 Hemoptysis, in the setting of supratherapeutic INR of 5.6 Acute on chronic respiratory failure with hypoxia SIRS, unlikely sepsis Patient presents with cough, blood-tinged sputum and shortness of breath with increasing O2 requirement.  O2 sats 82% at PCP with temp 102.1, WBC 15,000 CT chest showing dense right upper lobe pneumonia with recommendation to repeat chest x-ray in 3 to 4 weeks to ensure resolution and rule out underlying malignancy - Rocephin  and azithromycin  -Continue supplemental oxygen  to keep sats over 94% - Antitussives, incentive spirometer, DuoNebs as needed -Monitor INR.- Pharmacy consulted to manage, however will continue to hold if hemoptysis worsens

## 2024-01-04 NOTE — ED Triage Notes (Signed)
 C/O headache and productive cough x 1 day. States sputum is blood tinged.  Seen by PCP and referred to ED.  INR elevated today. And spo2 low in clinic.

## 2024-01-04 NOTE — Progress Notes (Signed)
 Seen today by PCP for fever, Hypoxia, Hemoptysis, Diaphoresis and Malaise. Pt was advised by PCP to go to ER.  POCT INR was performed at PCP apt and routed to this nurse. It does not appear that pt has gone to ER as directed by PCP. Hold warfarin today, hold warfarin tomorrow and hold warfarin for the day after tomorrow. Change weekly dose to take 1/2 tablet daily except take 1 tablet on Monday, Wednesday and Friday. Recheck in 1 week.  LVM for pt to return call for instructions.   It appears that pt is in the ER now. Will f/u with pt tomorrow.

## 2024-01-04 NOTE — Assessment & Plan Note (Signed)
 CHF and untreated OSA likely contributes to hypoxia.

## 2024-01-04 NOTE — Assessment & Plan Note (Signed)
 CPAP intolerant and on nocturnal oxygen 

## 2024-01-04 NOTE — Assessment & Plan Note (Signed)
 On Breztri  through [facility] PAP

## 2024-01-04 NOTE — Assessment & Plan Note (Signed)
 No complaints of chest pain Continue simvastatin .  Currently on Coumadin  not on antiplatelet Not on beta-blocker likely due to being on amiodarone 

## 2024-01-04 NOTE — Assessment & Plan Note (Addendum)
 Just started ozempic  0.25mg  last Monday, may have taken 2 doses of 0.25mg  yesterday.  Notes anorexia and worsened constipation since ozempic  commencement - recommend holding ozempic  at ths time, at least until feeling better.

## 2024-01-04 NOTE — Progress Notes (Signed)
 Ph: [phone] Fax: [phone]   Patient ID: [name], male    DOB: [birth year], 68 y.o.   MRN: [id number]  This visit was conducted in person.  BP (!) 170/100   Pulse 84   Temp (!) 102.1 F (38.9 C) (Oral)   Resp (!) 28   Ht 5' 9 (1.753 m)   Wt 235 lb (106.6 kg)   SpO2 92% Comment: 2L o2  PF (!) 2 L/min   BMI 34.70 kg/m   BP Readings from Last 3 Encounters:  01/04/24 (!) 144/75  01/04/24 (!) 170/100  12/19/23 (!) 140/80  Presented to office with O2 sat 81% off oxygen .  BP remaining elevated on recheck.   CC: not feeling well Subjective:   HPI: [name] is a 68 y.o. male presenting on 01/04/2024 for Acute Visit (Not feeling well: headache, fatigue, short of breath, coughing blood tinged sputum)   Has oxygen  concentrator and tanks at home. No portable oxygen  concentrator.   2d h/o worsening R headache with R neck pain. Endorse R maxillary facial pain.  More tired, fatigued, short winded. Cough is about the same.   Last night started coughing up blood-tinged mucous.  Felt feverish recently. Nauseated yesterday.  Mild ST. No know sick contacts.   No ear pain. No new body aches, abdominal pain, vomiting, chest pain, wheezing, new leg swelling. Sleeping in bed, 2 pillows, unchanged.   Lab Results  Component Value Date   HGBA1C 6.9 (A) 12/19/2023    Chronic R sided headache - ongoing. See prior notes. Pending ENT eval for sinusitis contribution to chronic headache. He states he has not heard from ENT about scheduling appt. Referral placed 12/17/2023.  Last treated for sinusitis with 10d augmentin  course 10/2023.   Ozempic  started last Monday. Having trouble with pens: he may have taken two 0.25mg  shots yesterday.  Since starting ozempic  he notes decreased appetite. Also more constipated.   INR today = 5.6.  Lab Results  Component Value Date   INR 5.6 (A) 01/04/2024   INR 1.9 (A) 12/15/2023   INR 1.7 (A) 11/24/2023       Relevant past  medical, surgical, family and social history reviewed and updated as indicated. Interim medical history since our last visit reviewed. Allergies and medications reviewed and updated. Outpatient Medications Prior to Visit  Medication Sig Dispense Refill   acetaminophen  (TYLENOL ) 500 MG tablet Take 2 tablets (1,000 mg total) by mouth every 6 (six) hours as needed for mild pain.     albuterol  (VENTOLIN  HFA) 108 (90 Base) MCG/ACT inhaler Inhale 2 puffs into the lungs every 6 (six) hours as needed for wheezing or shortness of breath. 8.5 each 6   amiodarone  (PACERONE ) 200 MG tablet Take 1 tablet (200 mg total) by mouth daily. 90 tablet 3   Blood Glucose Monitoring Suppl (ACCU-CHEK GUIDE ME) w/Device KIT 2 (two) times daily.     Blood Glucose Monitoring Suppl DEVI 1 each by Does not apply route in the morning, at noon, and at bedtime. May substitute to any manufacturer covered by patient's insurance. 1 each 0   budesonide -glycopyrrolate -formoterol  (BREZTRI  AEROSPHERE) 160-9-4.8 MCG/ACT AERO inhaler Inhale 2 puffs into the lungs 2 (two) times daily. 32.1 g 11   Cholecalciferol  (VITAMIN D ) 50 MCG (2000 UT) CAPS Take 1 capsule (2,000 Units total) by mouth daily. 30 capsule    dapagliflozin  propanediol (FARXIGA ) 10 MG TABS tablet Take 1 tablet (10 mg total) by mouth daily before breakfast. 90 tablet 3  Docusate Calcium (STOOL SOFTENER PO) Take 1 tablet by mouth daily.     FLUoxetine  (PROZAC ) 40 MG capsule TAKE 1 CAPSULE (40 MG TOTAL) BY MOUTH DAILY. 90 capsule 2   fluticasone  (FLONASE ) 50 MCG/ACT nasal spray Place 2 sprays into both nostrils daily. (Patient taking differently: Place 2 sprays into both nostrils as needed.) 48 mL 3   furosemide  (LASIX ) 80 MG tablet Take 1 tablet (80 mg total) by mouth daily. With second dose as needed for fluid retention, leg swelling 120 tablet 3   glimepiride  (AMARYL ) 4 MG tablet Take 0.5 tablets (2 mg total) by mouth daily with breakfast.     Glucose Blood (BLOOD GLUCOSE  TEST STRIPS) STRP 1 each by In Vitro route in the morning, at noon, and at bedtime. May substitute to any manufacturer covered by patient's insurance. 300 each 0   ipratropium-albuterol  (DUONEB) 0.5-2.5 (3) MG/3ML SOLN TAKE 3 MLS BY NEBULIZATION EVERY 6 (SIX) HOURS AS NEEDED (SEVERE SHORTNESS OF BREATH/WHEEZING). 360 mL 0   Lancet Device MISC 1 each by Does not apply route in the morning, at noon, and at bedtime. May substitute to any manufacturer covered by patient's insurance. 300 each 0   Lancets Misc. MISC 1 each by Does not apply route in the morning, at noon, and at bedtime. May substitute to any manufacturer covered by patient's insurance. 300 each 0   latanoprost  (XALATAN ) 0.005 % ophthalmic solution Place 1 drop into both eyes at bedtime.     metFORMIN  (GLUCOPHAGE ) 500 MG tablet Take 1 tablet (500 mg total) by mouth daily with breakfast. 90 tablet 3   methocarbamol  (ROBAXIN ) 500 MG tablet Take 1 tablet (500 mg total) by mouth 3 (three) times daily as needed for muscle spasms. 30 tablet 3   metolazone  (ZAROXOLYN ) 2.5 MG tablet Take 1 tablet (2.5 mg total) by mouth once a week. As needed for leg swelling 20 tablet 0   montelukast  (SINGULAIR ) 10 MG tablet Take 1 tablet (10 mg total) by mouth at bedtime. 90 tablet 4   OXYGEN  Inhale 2 L into the lungs at bedtime.     pantoprazole  (PROTONIX ) 40 MG tablet Take 1 tablet (40 mg total) by mouth 2 (two) times daily before a meal. 180 tablet 4   potassium chloride  (MICRO-K ) 10 MEQ CR capsule Take 1 capsule (10 mEq total) by mouth daily. 110 capsule 3   [START ON 01/09/2024] Semaglutide ,0.25 or 0.5MG /DOS, (OZEMPIC , 0.25 OR 0.5 MG/DOSE,) 2 MG/1.5ML SOPN Inject 0.5 mg into the skin once a week. 1.5 mL 6   Semaglutide ,0.25 or 0.5MG /DOS, (OZEMPIC , 0.25 OR 0.5 MG/DOSE,) 2 MG/3ML SOPN Inject 0.25 mg into the skin once a week. 3 mL 0   simvastatin  (ZOCOR ) 20 MG tablet Take 1 tablet (20 mg total) by mouth every evening. 90 tablet 4   triamcinolone  cream (KENALOG )  0.1 % Apply 1 Application topically 2 (two) times daily. For no more than 10 days at a time 45 g 0   vitamin B-12 (CYANOCOBALAMIN ) 1000 MCG tablet Take 1 tablet (1,000 mcg total) by mouth every Monday, Wednesday, and Friday.     warfarin (COUMADIN ) 5 MG tablet TAKE 1 TABLET BY MOUTH DAILY EXCEPT TAKE 1/2 TABLET ON MONDAY, WEDNESDAY AND SATURDAY OR AS DIRECTED BY ANTICOAGULATION CLINIC 105 tablet 1   No facility-administered medications prior to visit.    Past Medical History:  Diagnosis Date   Adrenal nodule (HCC) 12/28/2021   a.) CT abd/pel 12/28/2021: LEFT adrenal nodule measuring 1.6 cm   Anemia  Angina    Anxiety    Aortic atherosclerosis (HCC)    Arthritis    Atrial fibrillation and flutter (HCC) 2013   a.) CHA2DS2VASc = 5 (age, CHF, HTN, vascular disease history, T2DM);  b.) s/p DCCV (100 J) 10/27/2010; c.) s/p ablation 09/21/2011; d.) rate/rhythm maintained on oral amiodarone  + carvedilol ; chronically anticoagulated with warfarin   CAD (coronary artery disease) 07/15/2010   a.) LHC 07/15/2010: 30% pLAD, 40% D1, 25%/30% pLCx, 30% mLCx - med mgmt   CHF (congestive heart failure) - NYHA Class II/III 2013   a.) TTE 02/26/2013: EF 50%, mild MR/TR; b.) EST 06/13/2017: EF 50%, mild LCH, triv MR/TR; c.) TTTE 03/24/2020: EF >55%, mild LVH, mild MAC, mild BAE, triv PR, mild MR/TR, G1DD; d.) TTE 11/19/2021: EF 60-65%, mod reduced RVSF, mild LAE, mod MAC, Ao root 37 mm, G2DD   COPD (chronic obstructive pulmonary disease) (HCC)    Diabetes type 2, uncontrolled    declines DSME   Dilated cardiomyopathy (HCC) 2013   a.) EF 25% at time of Dx in 2013; b.) TTE 02/26/2013: EF 50%; c.) EST 06/13/2017: EF 50%;  d.) TTE 03/24/2020: EF >55%; e.) TTE 11/19/2021: EF 60-65%   Ex-smoker    GERD (gastroesophageal reflux disease)    Hepatic steatosis    Hyperlipidemia    Hypertension    Infrarenal abdominal aortic aneurysm (AAA) without rupture (HCC) 08/11/2016   a.) CT abd/pel 08/11/2016: 3.3 cm; b.) CT  abd/pel 12/22/2018: 3.3 cm; c.) CT abd/pel 08/31/2021: 3.7 cm; d.) CT abd/pel 12/28/2021: 3.9 cm   Long term current use of amiodarone  2023   a.) previously on dranaderone (cost prohibitive); switched to amiodarone  2023   Long term current use of anticoagulant    a.) warfarin   Marijuana use    a.) UDS (+) of THC x 3 (02/06/2013, 06/29/2013, 09/03/2013); PCP advised one more and we will stop prescribing ativan  (08/2013)   Migraine    Narcolepsy    Obesity    OSA (obstructive sleep apnea)    a.) unable to tolerate nocturnal PAP therapy; uses supplemetal oxygen  at 2L/[place] during sleep   Seasonal allergies    Umbilical hernia     Past Surgical History:  Procedure Laterality Date   ATRIAL FLUTTER ABLATION N/A 09/21/2011   Procedure: ATRIAL FLUTTER ABLATION;  Surgeon: Lynwood Rakers, MD;  Location[facility] CATH LAB;  Service: Cardiovascular;  Laterality: N/A;   CARDIAC ELECTROPHYSIOLOGY MAPPING AND ABLATION  2013   for atrial flutter   CARDIOVASCULAR STRESS TEST  03/2012   ETT WNL [place])   CARDIOVERSION N/A 08/12/2021   Procedure: CARDIOVERSION;  Surgeon: Hester Wolm PARAS, MD;  Location: [facility] ORS;  Service: Cardiovascular;  Laterality: N/A;   COLONOSCOPY WITH PROPOFOL  N/A 05/22/2020   TA, diverticulosis, descending colon ulcer biopsy WNL, int hem Renne, Darren, MD)   COLONOSCOPY WITH PROPOFOL  N/A 04/28/2022   multiple TAs - 10 small, 2 1.2cm, 2 9mm, rpt 3 yrs (Vanga, Corinn Skiff, MD)   CYST EXCISION N/A 08/05/2022   excision of back cysts x 2 (pathology returned epidermal inclusion cysts);  Surgeon: Desiderio Schanz, MD   ESOPHAGOGASTRODUODENOSCOPY (EGD) WITH PROPOFOL  N/A 05/22/2020   WNL (Wohl)   ESOPHAGOGASTRODUODENOSCOPY (EGD) WITH PROPOFOL  N/A 04/28/2022   12mm duodenal TA, reactive gastropathy, enteric mucosa in stomach biopsy of unclear significance (Vanga, Corinn Skiff, MD)   GIVENS CAPSULE STUDY  04/28/2022   Procedure: GIVENS CAPSULE STUDY;  Surgeon: Unk Corinn Skiff, MD;   Location: [facility] ENDOSCOPY;  Service: Endoscopy;;  US  ECHOCARDIOGRAPHY  2014   EF 50%, nl LV fxn, RV nl size/function, mild mitral insuff   Per HPI unless specifically indicated in ROS section below Review of Systems  Objective:  BP (!) 170/100   Pulse 84   Temp (!) 102.1 F (38.9 C) (Oral)   Resp (!) 28   Ht 5' 9 (1.753 m)   Wt 235 lb (106.6 kg)   SpO2 92% Comment: 2L o2  PF (!) 2 L/min   BMI 34.70 kg/m   Wt Readings from Last 3 Encounters:  01/04/24 234 lb 12.6 oz (106.5 kg)  01/04/24 235 lb (106.6 kg)  12/19/23 242 lb (109.8 kg)      Physical Exam Vitals and nursing note reviewed.  Constitutional:      Appearance: Normal appearance. He is ill-appearing and diaphoretic.  HENT:     Head: Normocephalic and atraumatic.     Salivary Glands: Right salivary gland is tender. Right salivary gland is not diffusely enlarged. Left salivary gland is not diffusely enlarged or tender.     Right Ear: Tympanic membrane, ear canal and external ear normal.     Left Ear: Tympanic membrane, ear canal and external ear normal.     Nose:     Right Sinus: No maxillary sinus tenderness or frontal sinus tenderness.     Left Sinus: No maxillary sinus tenderness or frontal sinus tenderness.     Mouth/Throat:     Mouth: Mucous membranes are moist.     Pharynx: Oropharynx is clear. No oropharyngeal exudate or posterior oropharyngeal erythema.  Eyes:     Extraocular Movements: Extraocular movements intact.     Pupils: Pupils are equal, round, and reactive to light.  Cardiovascular:     Rate and Rhythm: Normal rate and regular rhythm.     Pulses: Normal pulses.     Heart sounds: Normal heart sounds. No murmur heard. Pulmonary:     Effort: Pulmonary effort is normal. No respiratory distress.     Breath sounds: Normal breath sounds. No wheezing, rhonchi or rales.  Musculoskeletal:     Cervical back: Normal range of motion and neck supple.  Lymphadenopathy:     Cervical: No cervical adenopathy.   Skin:    General: Skin is warm.     Findings: No rash.  Neurological:     Mental Status: He is alert.  Psychiatric:        Mood and Affect: Mood normal.        Behavior: Behavior normal.       Results for orders placed or performed in visit on 01/04/24  POC COVID-19   Collection Time: 01/04/24  3:51 PM  Result Value Ref Range   SARS Coronavirus 2 Ag Negative Negative  Influenza A/B   Collection Time: 01/04/24  3:52 PM  Result Value Ref Range   Influenza A, POC Negative Negative   Influenza B, POC Negative Negative   *Note: Due to a large number of results and/or encounters for the requested time period, some results have not been displayed. A complete set of results can be found in Results Review.    Assessment & Plan:  Refused EMS transport - states he will have friend take him to ER today for further evaluation.   Problem List Items Addressed This Visit     Type 2 diabetes mellitus with other specified complication (HCC)   Just started ozempic  0.25mg  last Monday, may have taken 2 doses of 0.25mg  yesterday.  Notes anorexia and worsened constipation  since ozempic  commencement - recommend holding ozempic  at ths time, at least until feeling better.       COPD (chronic obstructive pulmonary disease) (HCC)   On Breztri  through [facility] PAP       Chronic respiratory failure with hypoxia and hypercapnia (HCC)   Previously only on nocturnal oxygen  supplementation in untreated OSA (CPAP intolerance).  New oxygen  requirement - with fever and acutely ill appearing, recommend ER eval r/o PNA or other cause.       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       Fever - Primary   Acute illness with fever Tmax 102.1 in office, with R sided headache and R maxillary facial pain, fatigue, shortness of breath and now coughing up blood tinged mucous. Presented with marked hypoxia to 82% off oxygen . Oxygen  does  improve to 92% on 2L [place].  Flu and COVID swabs today negative Lung exam overall reassuring.  Anticipate cough tinged sputum due to elevated INR - rec hold coumadin  at this time until ER evaluation, further recommendations to come through coumadin  clinic.  Possible acute sinusitis however given associated symptoms and acutely ill appearance, I did recommend further evaluation in ER today. Recommended EMS transport - he refuses. States he will have friend drive him to ER today.       Relevant Orders   POC COVID-19 (Completed)   Influenza A/B (Completed)   Other Visit Diagnoses       Acute on chronic respiratory failure with hypoxia and hypercapnia (HCC)         Hemoptysis         Diaphoresis         Malaise            No orders of the defined types were placed in this encounter.   Orders Placed This Encounter  Procedures   POC COVID-19   Influenza A/B    Patient Instructions  Covid swab today negative Flu swab today negative Hold coumadin  at this time as point of care INR was too high (5.6). Coumadin  clinic will reach out to you with further recommendations.  Given fever, shortness of breath and low oxygen , I do recommend ER evaluation today.   Follow up plan: No follow-ups on file.  Anton Blas, MD

## 2024-01-04 NOTE — Assessment & Plan Note (Signed)
 Supratherapeutic INR Continue amiodarone  Pharmacy consult for management of Coumadin  No reversal for supratherapeutic [place] INR unless evidence of worsening abscesses

## 2024-01-04 NOTE — Assessment & Plan Note (Signed)
 Not acutely exacerbated Continue home inhalers, with DuoNebs as needed

## 2024-01-04 NOTE — Patient Instructions (Addendum)
 Covid swab today negative Flu swab today negative Hold coumadin  at this time as point of care INR was too high (5.6). Coumadin  clinic will reach out to you with further recommendations.  Given fever, shortness of breath and low oxygen , I do recommend ER evaluation today.

## 2024-01-05 ENCOUNTER — Other Ambulatory Visit: Payer: Self-pay

## 2024-01-05 ENCOUNTER — Ambulatory Visit

## 2024-01-05 DIAGNOSIS — Z122 Encounter for screening for malignant neoplasm of respiratory organs: Secondary | ICD-10-CM

## 2024-01-05 DIAGNOSIS — J189 Pneumonia, unspecified organism: Secondary | ICD-10-CM | POA: Diagnosis not present

## 2024-01-05 DIAGNOSIS — Z87891 Personal history of nicotine dependence: Secondary | ICD-10-CM

## 2024-01-05 LAB — URINALYSIS, W/ REFLEX TO CULTURE (INFECTION SUSPECTED)
Bacteria, UA: NONE SEEN
Bilirubin Urine: NEGATIVE
Glucose, UA: >=500 mg/dL — AB
Ketones, ur: 5 mg/dL — AB
Leukocytes,Ua: NEGATIVE
Nitrite: NEGATIVE
Protein, ur: 100 mg/dL — AB
Specific Gravity, Urine: 1.038 — ABNORMAL HIGH (ref 1.005–1.030)
Squamous Epithelial / HPF: 0 /HPF (ref 0–5)
pH: 5 (ref 5.0–8.0)

## 2024-01-05 LAB — PROTIME-INR
INR: 4.3 (ref 0.8–1.2)
Prothrombin Time: 43.1 s — ABNORMAL HIGH (ref 11.4–15.2)

## 2024-01-05 LAB — BASIC METABOLIC PANEL WITH GFR
Anion gap: 12 (ref 5–15)
BUN: 19 mg/dL (ref 8–23)
CO2: 28 mmol/L (ref 22–32)
Calcium: 8.5 mg/dL — ABNORMAL LOW (ref 8.9–10.3)
Chloride: 95 mmol/L — ABNORMAL LOW (ref 98–111)
Creatinine, Ser: 1.25 mg/dL — ABNORMAL HIGH (ref 0.61–1.24)
GFR, Estimated: >60 mL/min (ref >60)
Glucose, Bld: 225 mg/dL — ABNORMAL HIGH (ref 70–99)
Potassium: 3.7 mmol/L (ref 3.5–5.1)
Sodium: 135 mmol/L (ref 135–145)

## 2024-01-05 LAB — CBC
HCT: 39.8 % (ref 39.0–52.0)
Hemoglobin: 12.9 g/dL — ABNORMAL LOW (ref 13.0–17.0)
MCH: 31.1 pg (ref 26.0–34.0)
MCHC: 32.4 g/dL (ref 30.0–36.0)
MCV: 95.9 fL (ref 80.0–100.0)
Platelets: 212 K/uL (ref 150–400)
RBC: 4.15 MIL/uL — ABNORMAL LOW (ref 4.22–5.81)
RDW: 14.1 % (ref 11.5–15.5)
WBC: 15.6 K/uL — ABNORMAL HIGH (ref 4.0–10.5)
nRBC: 0 % (ref 0.0–0.2)

## 2024-01-05 LAB — STREP PNEUMONIAE URINARY ANTIGEN: Strep Pneumo Urinary Antigen: NEGATIVE

## 2024-01-05 LAB — HIV ANTIBODY (ROUTINE TESTING W REFLEX): HIV Screen 4th Generation wRfx: NONREACTIVE

## 2024-01-05 LAB — CBG MONITORING, ED: Glucose-Capillary: 192 mg/dL — ABNORMAL HIGH (ref 70–99)

## 2024-01-05 MED ORDER — AMOXICILLIN-POT CLAVULANATE 875-125 MG PO TABS: 1.0000 | ORAL_TABLET | Freq: Two times a day (BID) | ORAL | 0 refills | Status: AC

## 2024-01-05 MED ORDER — AZITHROMYCIN 500 MG PO TABS: 500.0000 mg | ORAL_TABLET | Freq: Every day | ORAL | 0 refills | Status: AC

## 2024-01-05 MED ORDER — GUAIFENESIN ER 600 MG PO TB12: 600.0000 mg | ORAL_TABLET | Freq: Two times a day (BID) | ORAL | 0 refills | Status: AC

## 2024-01-05 NOTE — Progress Notes (Signed)
 PHARMACY - ANTICOAGULATION CONSULT NOTE  Pharmacy Consult for Warfarin Indication: atrial fibrillation  Allergies  Allergen Reactions   Atorvastatin Other (See Comments)    Dizziness   Lisinopril Cough    Patient Measurements: Weight: 106.5 kg (234 lb 12.6 oz)  Vital Signs: Temp: 99.5 F (37.5 C) (09/11 0502) Temp Source: Oral (09/11 0502) BP: 126/71 (09/11 0730) Pulse Rate: 81 (09/11 0900)  Labs: Recent Labs    01/04/24 0000 01/04/24 1349 01/04/24 1756 01/04/24 1915 01/05/24 0512  HGB  --  13.8  --   --  12.9*  HCT  --  42.1  --   --  39.8  PLT  --  218  --   --  212  LABPROT  --   --   --  42.3* 43.1*  INR 5.6*  --   --  4.2* 4.3*  CREATININE  --  1.30*  --   --  1.25*  TROPONINIHS  --   --  34* 34*  --     Estimated Creatinine Clearance: 68 mL/min (A) (by C-G formula based on SCr of 1.25 mg/dL (H)).   Medical History: Past Medical History:  Diagnosis Date   Adrenal nodule (HCC) 12/28/2021   a.) CT abd/pel 12/28/2021: LEFT adrenal nodule measuring 1.6 cm   Anemia    Angina    Anxiety    Aortic atherosclerosis (HCC)    Arthritis    Atrial fibrillation and flutter (HCC) 2013   a.) CHA2DS2VASc = 5 (age, CHF, HTN, vascular disease history, T2DM);  b.) s/p DCCV (100 J) 10/27/2010; c.) s/p ablation 09/21/2011; d.) rate/rhythm maintained on oral amiodarone  + carvedilol ; chronically anticoagulated with warfarin   CAD (coronary artery disease) 07/15/2010   a.) LHC 07/15/2010: 30% pLAD, 40% D1, 25%/30% pLCx, 30% mLCx - med mgmt   CHF (congestive heart failure) - NYHA Class II/III 2013   a.) TTE 02/26/2013: EF 50%, mild MR/TR; b.) EST 06/13/2017: EF 50%, mild LCH, triv MR/TR; c.) TTTE 03/24/2020: EF >55%, mild LVH, mild MAC, mild BAE, triv PR, mild MR/TR, G1DD; d.) TTE 11/19/2021: EF 60-65%, mod reduced RVSF, mild LAE, mod MAC, Ao root 37 mm, G2DD   COPD (chronic obstructive pulmonary disease) (HCC)    Diabetes type 2, uncontrolled    declines DSME   Dilated  cardiomyopathy (HCC) 2013   a.) EF 25% at time of Dx in 2013; b.) TTE 02/26/2013: EF 50%; c.) EST 06/13/2017: EF 50%;  d.) TTE 03/24/2020: EF >55%; e.) TTE 11/19/2021: EF 60-65%   Ex-smoker    GERD (gastroesophageal reflux disease)    Hepatic steatosis    Hyperlipidemia    Hypertension    Infrarenal abdominal aortic aneurysm (AAA) without rupture (HCC) 08/11/2016   a.) CT abd/pel 08/11/2016: 3.3 cm; b.) CT abd/pel 12/22/2018: 3.3 cm; c.) CT abd/pel 08/31/2021: 3.7 cm; d.) CT abd/pel 12/28/2021: 3.9 cm   Long term current use of amiodarone  2023   a.) previously on dranaderone (cost prohibitive); switched to amiodarone  2023   Long term current use of anticoagulant    a.) warfarin   Marijuana use    a.) UDS (+) of THC x 3 (02/06/2013, 06/29/2013, 09/03/2013); PCP advised one more and we will stop prescribing ativan  (08/2013)   Migraine    Narcolepsy    Obesity    OSA (obstructive sleep apnea)    a.) unable to tolerate nocturnal PAP therapy; uses supplemetal oxygen  at 2L/[place] during sleep   Seasonal allergies    Umbilical hernia  Medications:  9/10 Anti-coag visit: Hold warfarin until 9/13 given Supratherapeutic INR of 5.6 then start new dose of warfarin 5 mg PO MWF, 2.5 mg PO all other days   Assessment: Patient is a 68 year old male with a past medical history of COPD and OSA on nocturnal oxygen  (CPAP intolerant), DM, A-fib on Coumadin  and amiodarone , diastolic CHF, CAD with history of MI, and depression. He is admitted to the hospital for right upper lobe pneumonia with a productive cough of white phlegm intermittently blood-tinged. Also reports SOB and fever at home.    Patient has history of A-fib on Coumadin  as noted above. He was directed to hold warfarin until 9/13 given Supratherapeutic INR of 5.6 in clinic then start new dose of warfarin 5 mg PO MWF, 2.5 mg PO all other days. On admission to the ED, patient was noted to have white phlegm that was intermittently blood-tinged. No  reversal agents were given. Pharmacy was consulted to manage patients warfarin while inpatient.  Home dose: warfarin 5 mg PO MWF, 2.5 mg PO all other days (previously: warfarin 2.5 mg PO MWF, 5 mg PO all other days) Goal INR: 2-3 Last dose: 9/8 Baseline INR: 5.6  Date INR Warfarin Dose  9/10 5.6 4.2 HELD  9/11 4.3 HELD   Hgb 13.8>12.9 PLT 218>212  Drug interactions: amiodarone  (home medication)  Goal of Therapy:  INR 2-3 Monitor platelets by anticoagulation protocol: Yes   Plan:  INR remains SUPRA-therapeutic, may give a dose tomorrow if INR continues to decrease to prevent subtherapeutic INR  Continue to hold warfarin today INR daily while inpatient  Lum VEAR Mania, PharmD, BCPS 01/05/2024,9:11 AM

## 2024-01-05 NOTE — ED Notes (Signed)
 Pt oxygen  decreased to 2L [place]

## 2024-01-05 NOTE — Progress Notes (Signed)
 Review of chart today reveals pt is currently admitted.

## 2024-01-05 NOTE — Discharge Summary (Signed)
 Physician Discharge Summary   Patient: [name] MRN: [id number] DOB: [birth year]  Admit date:     01/04/2024  Discharge date: 01/05/24  Discharge Physician: Delon Herald   PCP: Rilla Baller, MD   Recommendations at discharge:   Complete antibiotics - start tomorrow and take Augmentin  twice daily x 7 days and Azithromycin  once daily for another 4 days Wear oxygen  continuously or as needed to maintain saturations of 88-92% Return to the ER should symptoms worsen You will need a follow up chest xray in 3-4 weeks You also need annual follow up imaging of your thoracic aortic aneurysm Follow up within 1 week with Dr. Rilla Hold warfarin until 9/13 and then change regimen to 5mg  on M-W-F, 2.5mg  all other days; follow up with Coumadin  Clinic as planned  Discharge Diagnoses: Principal Problem:   Right upper lobe pneumonia Active Problems:   Acute on chronic respiratory failure with hypoxia (HCC)   Hemoptysis   SIRS (systemic inflammatory response syndrome) (HCC)   Paroxysmal atrial fibrillation (HCC)   Supratherapeutic INR   Right-sided headache   COPD (chronic obstructive pulmonary disease) (HCC)   HFrEF with improved EF (previously 25%, most recently 55% 12/2022)   Cardiomyopathy, dilated (HCC)   Obstructive sleep apnea   CAD with history of MI   Thoracic ascending aortic aneurysm (HCC)   Hypertension   Type 2 diabetes mellitus with other specified complication [facility])    Hospital Course: [phone] with h/o COPD and OSA on nocturnal O2 (CPAP intolerant), DM, afib on Coumadin  and amiodarone , chronic HFpEF, CAD, and depression who presented on 9/10 with hemoptysis. He was found to have RUL PNA. He was noted to have acute respiratory failure with O2 sat 87% on room air.    Assessment and Plan:  Right upper lobe pneumonia with acute on chronic respiratory failure with hypoxia Presented with hemoptysis, in the setting of supratherapeutic INR of 5.6 Patient presents with  cough, blood-tinged sputum and shortness of breath with increasing O2 requirement O2 sats 82% at PCP with temp 102.1, WBC 15,000 SIRS criteria without evidence of sepsis CT chest showing dense right upper lobe pneumonia with recommendation to repeat chest x-ray in 3 to 4 weeks to ensure resolution and rule out underlying malignancy Treated with Rocephin , doxycycline , and azithromycin  Continue supplemental oxygen  to keep sats 88-92%, has home O2 Antitussives, incentive spirometer, DuoNebs as needed Ideally, would remain hospitalized for an additional night; however, he is reasonably stable and insistent about discharge so will dc to home with strict return instructions   Paroxysmal atrial fibrillation/Supratherapeutic INR Continue amiodarone  Pharmacy consulted for management of Coumadin  No reversal for supratherapeutic INR unless evidence of worsening hemoptysis   COPD (chronic obstructive pulmonary disease)  Not acutely exacerbated Continue home inhalers, with DuoNebs as needed   Right-sided headache Appears chronic and recurrent Continue Tylenol  and methocarbamol     HFrEF with improved EF (previously 25%, most recently 55% 12/2022) Clinically euvolemic Continue Lasix  and metolazone    CAD with history of MI No complaints of chest pain Continue simvastatin  Currently on Coumadin  so he is not on antiplatelet Not on beta-blocker likely due to being on amiodarone    Obstructive sleep apnea CPAP intolerant and on nocturnal oxygen    Thoracic ascending aortic aneurysm  Stable findings-recommendations for follow-up per radiology   Hypertension BP currently stable, 144/75 on admission Will continue furosemide  and metolazone    Type 2 diabetes mellitus with other specified complication A1c 6.9, good control Carb modified diet  Will hold glimepiride , Farxiga , metformin , semaglutide   Depression Continue fluoxetine    Class 1 obesity Body mass index is 34.67 kg/m.SABRA  Weight loss  should be encouraged on an ongoing basis Resume semaglutide  Outpatient PCP/bariatric medicine f/u encouraged Significantly low or high BMI is associated with higher medical risk including morbidity and mortality        Consultants: None   Procedures: None   Antibiotics: Ceftriaxone  x 1 Doxycycline  x 1 Azithromycin  x 1    Pain control - [place]  Controlled Substance Reporting System database was reviewed. and patient was instructed, not to drive, operate heavy machinery, perform activities at heights, swimming or participation in water activities or provide baby-sitting services while on Pain, Sleep and Anxiety Medications; until their outpatient Physician has advised to do so again. Also recommended to not to take more than prescribed Pain, Sleep and Anxiety Medications.   Disposition: Home Diet recommendation:  Carb modified diet DISCHARGE MEDICATION: Allergies as of 01/05/2024       Reactions   Atorvastatin Other (See Comments)   Dizziness   Lisinopril Cough        Medication List     TAKE these medications    acetaminophen  500 MG tablet Commonly known as: TYLENOL  Take 2 tablets (1,000 mg total) by mouth every 6 (six) hours as needed for mild pain.   albuterol  108 (90 Base) MCG/ACT inhaler Commonly known as: VENTOLIN  HFA Inhale 2 puffs into the lungs every 6 (six) hours as needed for wheezing or shortness of breath.   amiodarone  200 MG tablet Commonly known as: PACERONE  Take 1 tablet (200 mg total) by mouth daily.   amoxicillin -clavulanate 875-125 MG tablet Commonly known as: AUGMENTIN  Take 1 tablet by mouth 2 (two) times daily for 7 days. Start taking on: January 06, 2024   azithromycin  500 MG tablet Commonly known as: Zithromax  Take 1 tablet (500 mg total) by mouth daily for 4 days. Start taking on: January 06, 2024   Blood Glucose Monitoring Suppl Devi 1 each by Does not apply route in the morning, at noon, and at bedtime. May substitute to  any manufacturer covered by patient's insurance.   Accu-Chek Guide Me w/Device Kit 2 (two) times daily.   BLOOD GLUCOSE TEST STRIPS Strp 1 each by In Vitro route in the morning, at noon, and at bedtime. May substitute to any manufacturer covered by patient's insurance.   Breztri  Aerosphere 160-9-4.8 MCG/ACT Aero inhaler Generic drug: budesonide -glycopyrrolate -formoterol  Inhale 2 puffs into the lungs 2 (two) times daily.   cyanocobalamin  1000 MCG tablet Commonly known as: VITAMIN B12 Take 1 tablet (1,000 mcg total) by mouth every Monday, Wednesday, and Friday.   dapagliflozin  propanediol 10 MG Tabs tablet Commonly known as: Farxiga  Take 1 tablet (10 mg total) by mouth daily before breakfast.   FLUoxetine  40 MG capsule Commonly known as: PROZAC  TAKE 1 CAPSULE (40 MG TOTAL) BY MOUTH DAILY.   fluticasone  50 MCG/ACT nasal spray Commonly known as: FLONASE  Place 2 sprays into both nostrils daily. What changed:  when to take this reasons to take this   furosemide  80 MG tablet Commonly known as: LASIX  Take 1 tablet (80 mg total) by mouth daily. With second dose as needed for fluid retention, leg swelling   glimepiride  4 MG tablet Commonly known as: AMARYL  Take 0.5 tablets (2 mg total) by mouth daily with breakfast.   guaiFENesin  600 MG 12 hr tablet Commonly known as: MUCINEX  Take 1 tablet (600 mg total) by mouth 2 (two) times daily.   ipratropium-albuterol  0.5-2.5 (3) MG/3ML Soln Commonly known  as: DUONEB TAKE 3 MLS BY NEBULIZATION EVERY 6 (SIX) HOURS AS NEEDED (SEVERE SHORTNESS OF BREATH/WHEEZING).   Lancet Device Misc 1 each by Does not apply route in the morning, at noon, and at bedtime. May substitute to any manufacturer covered by patient's insurance.   Lancets Misc. Misc 1 each by Does not apply route in the morning, at noon, and at bedtime. May substitute to any manufacturer covered by patient's insurance.   latanoprost  0.005 % ophthalmic solution Commonly known  as: XALATAN  Place 1 drop into both eyes at bedtime.   metFORMIN  500 MG tablet Commonly known as: GLUCOPHAGE  Take 1 tablet (500 mg total) by mouth daily with breakfast.   methocarbamol  500 MG tablet Commonly known as: ROBAXIN  Take 1 tablet (500 mg total) by mouth 3 (three) times daily as needed for muscle spasms.   metolazone  2.5 MG tablet Commonly known as: ZAROXOLYN  Take 1 tablet (2.5 mg total) by mouth once a week. As needed for leg swelling   montelukast  10 MG tablet Commonly known as: SINGULAIR  Take 1 tablet (10 mg total) by mouth at bedtime.   OXYGEN  Inhale 2 L into the lungs at bedtime.   Ozempic  (0.25 or 0.5 MG/DOSE) 2 MG/1.5ML Sopn Generic drug: Semaglutide (0.25 or 0.5MG /DOS) Inject 0.5 mg into the skin once a week. Start taking on: January 09, 2024 What changed: Another medication with the same name was removed. Continue taking this medication, and follow the directions you see here.   pantoprazole  40 MG tablet Commonly known as: PROTONIX  Take 1 tablet (40 mg total) by mouth 2 (two) times daily before a meal.   potassium chloride  10 MEQ CR capsule Commonly known as: MICRO-K  Take 1 capsule (10 mEq total) by mouth daily.   simvastatin  20 MG tablet Commonly known as: ZOCOR  Take 1 tablet (20 mg total) by mouth every evening.   STOOL SOFTENER PO Take 1 tablet by mouth daily.   triamcinolone  cream 0.1 % Commonly known as: KENALOG  Apply 1 Application topically 2 (two) times daily. For no more than 10 days at a time   Vitamin D  50 MCG (2000 UT) Caps Take 1 capsule (2,000 Units total) by mouth daily.   warfarin 5 MG tablet Commonly known as: COUMADIN  Take as directed. If you are unsure how to take this medication, talk to your nurse or doctor. Original instructions: TAKE 1 TABLET BY MOUTH DAILY EXCEPT TAKE 1/2 TABLET ON MONDAY, WEDNESDAY AND SATURDAY OR AS DIRECTED BY ANTICOAGULATION CLINIC        Discharge Exam:    Subjective: He reports feeling  better.  He is insistent about going home today - he reports having a big dog (a [place]) at home that he must get back home to.  He usually wears nocturnal home O2 but has access to this O2 and is willing to wear it ATC as needed.  He understands that he is recommended to stay overnight for at least 1 more day but prefers to discharge.  He is aware of the s/sx that would require that he return.   Objective: Vitals:   01/05/24 0815 01/05/24 0900  BP:  130/86  Pulse: 77 81  Resp: 20 (!) 21  Temp:  98.9 F (37.2 C)  SpO2: 92% 97%   No intake or output data in the 24 hours ending 01/05/24 1025 Filed Weights   01/04/24 1343  Weight: 106.5 kg    Exam:  General:  Appears somewhat disheveled, sitting up in bedside chair with head propped  on bed for sleeping because the bed is too uncomfortable, NAD Eyes:  normal lids, iris ENT:  grossly normal hearing, lips & tongue, mmm\ Cardiovascular:  RRR, no m/r/g. No LE edema.  Respiratory:   CTA bilaterally with no wheezes/rales/rhonchi.  Normal respiratory effort. Abdomen:  soft, NT, ND Skin:  no rash or induration seen on limited exam Musculoskeletal:  grossly normal tone BUE/BLE, good ROM, no bony abnormality Psychiatric:  grossly normal mood and affect, speech fluent and appropriate, AOx3 Neurologic:  CN 2-12 grossly intact, moves all extremities in coordinated fashion  Data Reviewed: I have reviewed the patient's lab results since admission.  Pertinent labs for today include:  Glucose 225 BUN 19/Creatinine 1.25/GFR >60 WBC 15.6 Hgb 12.9 INR 4.3 UA: glucose >500, small Hgb, 100 protein     Condition at discharge: fair  The results of significant diagnostics from this hospitalization (including imaging, microbiology, ancillary and laboratory) are listed below for reference.   Imaging Studies: CT Angio Chest PE W/Cm &/Or Wo Cm Result Date: 01/04/2024 CLINICAL DATA:  Headache, productive cough for 1 day, blood tinged  sputum, elevated INR, hypoxia EXAM: CT ANGIOGRAPHY CHEST WITH CONTRAST TECHNIQUE: Multidetector CT imaging of the chest was performed using the standard protocol during bolus administration of intravenous contrast. Multiplanar CT image reconstructions and MIPs were obtained to evaluate the vascular anatomy. RADIATION DOSE REDUCTION: This exam was performed according to the departmental dose-optimization program which includes automated exposure control, adjustment of the mA and/or kV according to patient size and/or use of iterative reconstruction technique. CONTRAST:  75mL OMNIPAQUE  IOHEXOL  350 MG/ML SOLN COMPARISON:  01/04/2024, 09/07/2022 FINDINGS: Cardiovascular: This is a technically adequate evaluation of the pulmonary vasculature. No filling defects or pulmonary emboli. Dilated main pulmonary artery consistent with pulmonary arterial hypertension. The heart is enlarged without pericardial effusion. Stable 4.2 cm ascending thoracic aortic aneurysm. Atherosclerosis of the aorta and coronary vasculature. Mediastinum/Nodes: Stable benign calcified anterior mediastinal cyst, either a pericardial or thymic origin. Enlarged right hilar lymph nodes are likely reactive. No mediastinal adenopathy. Thyroid , trachea, and esophagus are unremarkable. Lungs/Pleura: Dense right upper lobe perihilar consolidation is again identified, most consistent with pneumonia. Linear consolidation at the right lung base compatible with atelectasis. The left chest is clear. No effusion or pneumothorax. The central airways are patent. Upper Abdomen: No acute abnormality. Musculoskeletal: No acute or destructive bony abnormalities. Reconstructed images demonstrate no additional findings. Review of the MIP images confirms the above findings. IMPRESSION: 1. No evidence of pulmonary embolus. 2. Dense right upper lobe airspace disease consistent with pneumonia. Followup PA and lateral chest X-ray is recommended in 3-4 weeks following trial of  antibiotic therapy to ensure resolution and exclude underlying malignancy. 3. Right hilar adenopathy, likely reactive. 4. Cardiomegaly. 5. Dilated main pulmonary artery compatible with pulmonary arterial hypertension. 6. Stable 4.2 cm ascending thoracic aortic aneurysm. Recommend annual imaging followup by CTA or MRA. This recommendation follows 2010 ACCF/AHA/AATS/ACR/ASA/SCA/SCAI/SIR/STS/SVM Guidelines for the Diagnosis and Management of Patients with Thoracic Aortic Disease. Circulation. 2010; 121: Z733-z630. Aortic aneurysm NOS (ICD10-I71.9) 7. Aortic Atherosclerosis (ICD10-I70.0). Coronary artery atherosclerosis. Electronically Signed   By: Ozell Daring M.D.   On: 01/04/2024 19:22   CT Head Wo Contrast Result Date: 01/04/2024 CLINICAL DATA:  Headache, productive cough EXAM: CT HEAD WITHOUT CONTRAST TECHNIQUE: Contiguous axial images were obtained from the base of the skull through the vertex without intravenous contrast. RADIATION DOSE REDUCTION: This exam was performed according to the departmental dose-optimization program which includes automated exposure control, adjustment of the  mA and/or kV according to patient size and/or use of iterative reconstruction technique. COMPARISON:  10/25/2023 FINDINGS: Brain: Chronic small vessel ischemic changes are seen within the periventricular and subcortical white matter. No acute infarct or hemorrhage. Lateral ventricles and midline structures appear unremarkable. No acute extra-axial fluid collections. No mass effect. Vascular: No hyperdense vessel or unexpected calcification. Skull: Normal. Negative for fracture or focal lesion. Sinuses/Orbits: No acute finding. Other: None. IMPRESSION: 1. Stable head CT, no acute intracranial process. Electronically Signed   By: Ozell Daring M.D.   On: 01/04/2024 19:16   DG Chest 2 View Result Date: 01/04/2024 CLINICAL DATA:  Hypoxia, productive cough for 1 day. EXAM: CHEST - 2 VIEW COMPARISON:  July 26, 2022. FINDINGS:  Stable cardiomediastinal silhouette. Left lung is clear. Right upper lobe opacity is noted most consistent with pneumonia. Mild right basilar atelectasis or infiltrate is noted. Bony thorax is unremarkable. IMPRESSION: Right upper lobe opacity is noted most consistent with pneumonia. Mild right basilar subsegmental atelectasis or infiltrate is noted. Followup PA and lateral chest X-ray is recommended in 3-4 weeks following trial of antibiotic therapy to ensure resolution and exclude underlying malignancy. Electronically Signed   By: Lynwood Landy Raddle M.D.   On: 01/04/2024 14:22    Microbiology: Results for orders placed or performed during the hospital encounter of 01/04/24  Culture, blood (routine x 2) Call MD if unable to obtain prior to antibiotics being given     Status: None (Preliminary result)   Collection Time: 01/04/24  9:05 PM   Specimen: BLOOD  Result Value Ref Range Status   Specimen Description BLOOD LEFT ARM  Final   Special Requests   Final    BOTTLES DRAWN AEROBIC AND ANAEROBIC Blood Culture results may not be optimal due to an inadequate volume of blood received in culture bottles   Culture   Final    NO GROWTH < 12 HOURS Performed at [facility], [address], [place], [place] [address]    Report Status PENDING  Incomplete  Culture, blood (routine x 2) Call MD if unable to obtain prior to antibiotics being given     Status: None (Preliminary result)   Collection Time: 01/04/24  9:05 PM   Specimen: BLOOD  Result Value Ref Range Status   Specimen Description BLOOD LEFT ARM  Final   Special Requests   Final    BOTTLES DRAWN AEROBIC AND ANAEROBIC Blood Culture results may not be optimal due to an inadequate volume of blood received in culture bottles   Culture   Final    NO GROWTH < 12 HOURS Performed at [facility], [address], [place], [place] [address]    Report Status PENDING  Incomplete   *Note: Due to a large number of results and/or  encounters for the requested time period, some results have not been displayed. A complete set of results can be found in Results Review.    Labs: CBC: Recent Labs  Lab 01/04/24 1349 01/05/24 0512  WBC 15.2* 15.6*  NEUTROABS 12.9*  --   HGB 13.8 12.9*  HCT 42.1 39.8  MCV 96.1 95.9  PLT 218 212   Basic Metabolic Panel: Recent Labs  Lab 01/04/24 1349 01/05/24 0512  NA 138 135  K 4.1 3.7  CL 95* 95*  CO2 29 28  GLUCOSE 169* 225*  BUN 18 19  CREATININE 1.30* 1.25*  CALCIUM 8.9 8.5*   Liver Function Tests: Recent Labs  Lab 01/04/24 1349  AST 27  ALT 20  ALKPHOS 89  BILITOT 0.6  PROT 7.3  ALBUMIN 3.4*   CBG: Recent Labs  Lab 01/04/24 2124 01/05/24 0850  GLUCAP 132* 192*    Discharge time spent: greater than 30 minutes.  Signed: Delon Herald, MD [facility] 01/05/2024

## 2024-01-06 ENCOUNTER — Telehealth: Payer: Self-pay

## 2024-01-06 LAB — LEGIONELLA PNEUMOPHILA SEROGP 1 UR AG: L. pneumophila Serogp 1 Ur Ag: NEGATIVE

## 2024-01-06 NOTE — Telephone Encounter (Signed)
 Pt LVM with coumadin  clinic. He reports he was d/c with two abx and he was told by someone that it interacts so he will hold his warfarin for the next 4 days until he finishes the abx. He advised if this is not correct to call back.   Pt was d/c with Augmentin  and Azithromycin . No interaction with Augmentin  but there is an interaction with Azithromycin .   Warfarin dosing instructions given at d/c were to hold warfarin until 9/13 and then change dosing to take 5 mg Monday, Wednesday and Friday and take 2.5 mg on all other days. This dose is lower than his prior dose which was; take 5 mg daily except take 1/2 tablet on Wednesday and Saturday.   Will need to check INR next week, on 9/18, for interaction and dosing changes.  It appears pt may have also been d/c with amiodarone , or at least given amiodarone  in the hospital. This has a major interaction with warfarin.  LVM for pt to return call 3 times

## 2024-01-06 NOTE — Telephone Encounter (Signed)
 Left another VM instructing pt to follow warfarin dosing instructions given at d/c; advised to restart warfarin on 9/13 and then take 5 mg on Monday, Wednesday and Friday and take 2.5 mg the other days. Advised this nurse will f/u with him on Monday, 9/15.

## 2024-01-06 NOTE — Patient Instructions (Signed)
 Visit Information  Thank you for taking time to visit with me today. Please don't hesitate to contact me if I can be of assistance to you before our next scheduled telephone appointment.  Our next appointment is by telephone on 01/11/24 at 11 am  Following is a copy of your care plan:   Goals Addressed             This Visit's Progress    VBCI Transitions of Care (TOC) Care Plan       Problems:  Recent Hospitalization for treatment of pneumonia Knowledge Deficit Related to management of pneumonia, DME- patient states he doesn't know how to use his portable oxygen  tanks and No Hospital Follow Up Provider appointment patient states he will talk with his primary care office regarding a follow up visit.  Goal:  Over the next 30 days, the patient will not experience hospital readmission  Interventions:  Transitions of Care: Durable Medical Equipment (DME) reviewed with patient/caregiver, needs identified and provider notified, and education provided Doctor Visits  - discussed the importance of doctor visits Discussed and offered the 30 day TOC program. Patient verbally agreed.  Advised to call the coumadin  clinic and/ or primary care provider to determine if he should restart his coumadin .  Offered to assist patient with setting up hospital follow up visit. Patient declined.  Advised to call and schedule primary care hospital follow up visit.  Advised to call provider for worsening symptoms or call 911 for severe symptoms Advised to monitor oxygen  level daily and wear oxygen  as prescribed/ recommended (Per provider recommendation: Wear oxygen  continuously or as needed to maintain saturations of 88-92%) Advised to follow up with provider in 3-4 weeks for a chest xray follow up Notified patient of doctor recommendation from 01/05/24 discharge summary: Hold warfarin until 9/13 and then change regimen to 5mg  on M-W-F, 2.5mg  all other days; follow up with Coumadin  Clinic as planned Advised to  use incentive spirometer at least 3 times per day use incentive spirometer at least 3 times per day  Patient Self Care Activities:  Attend all scheduled provider appointments Call pharmacy for medication refills 3-7 days in advance of running out of medications Call provider office for new concerns or questions  Notify RN Care Manager of TOC call rescheduling needs Participate in Transition of Care Program/Attend TOC scheduled calls Take medications as prescribed   Call 911 for severe symptoms: SOB, chest pain, stroke like symptoms. (Per provider recommendation: Wear oxygen  continuously or as needed to maintain saturations of 88-92%) Doctor recommendation regarding coumadin : Hold warfarin until 9/13 and then change regimen to 5mg  on M-W-F, 2.5mg  all other days; follow up with Coumadin  Clinic as planned   Plan:  Telephone follow up appointment with care management team member scheduled for:  01/11/24 at 11 am The patient has been provided with contact information for the care management team and has been advised to call with any health related questions or concerns.         Patient verbalizes understanding of instructions and care plan provided today and agrees to view in MyChart. Active MyChart status and patient understanding of how to access instructions and care plan via MyChart confirmed with patient.     The patient has been provided with contact information for the care management team and has been advised to call with any health related questions or concerns.   Please call the care guide team at [phone] if you need to cancel or reschedule your appointment.  Please call the Suicide and Crisis Lifeline: 988 call the [place]  National Suicide Prevention Lifeline: 332-527-4226 or TTY: 9044134061 TTY 219 378 7060) to talk to a trained counselor call 1-800-273-TALK (toll free, 24 hour hotline) go to [facility] [address] [place]  [phone]) if you are experiencing a Mental Health or Behavioral Health Crisis or need someone to talk to.  Arvin Seip RN, BSN, CCM [employer], Population Health Case Manager Phone: [phone]

## 2024-01-06 NOTE — Transitions of Care (Post Inpatient/ED Visit) (Signed)
 01/06/2024  Name: [name] MRN: [id number] DOB: 02-25-1956  Today's TOC FU Call Status: Today's TOC FU Call Status:: Successful TOC FU Call Completed TOC FU Call Complete Date: 01/06/24 Patient's Name and Date of Birth confirmed.  Transition Care Management Follow-up Telephone Call Date of Discharge: 01/05/24 Discharge Facility: [facility]) Type of Discharge: Inpatient Admission Primary Inpatient Discharge Diagnosis:: right upper lobe pneumonia How have you been since you were released from the hospital?: Better Any questions or concerns?: Yes Patient Questions/Concerns:: patient states his coumadin  level was high in the hospital therefore he did not take.  He wants to know if he should start back taking his coumadin  now that he is out of the hospital. Patient advised to call the coumadin  clinic and/ or primary care provider for advisement Patient Questions/Concerns Addressed: Other: (patient advised to call coumadin  clinic and/ or primary care provider.)  Items Reviewed: Did you receive and understand the discharge instructions provided?: Yes Medications obtained,verified, and reconciled?: Yes (Medications Reviewed) Any new allergies since your discharge?: No Dietary orders reviewed?: Yes Type of Diet Ordered:: carbohydrate modified. Do you have support at home?: Yes People in Home [RPT]: friend(s) Name of Support/Comfort Primary Source: Lesley Para  Medications Reviewed Today: Medications Reviewed Today     Reviewed by Tamanna Whitson E, RN (Registered Nurse) on 01/06/24 at 1357  Med List Status: <None>   Medication Order Taking? Sig Documenting Provider Last Dose Status Informant  acetaminophen  (TYLENOL ) 500 MG tablet [id number] Yes Take 2 tablets (1,000 mg total) by mouth every 6 (six) hours as needed for mild pain. Desiderio Schanz, MD  Active Self  albuterol  (VENTOLIN  HFA) 108 (90 Base) MCG/ACT inhaler [id number] Yes Inhale 2 puffs into the  lungs every 6 (six) hours as needed for wheezing or shortness of breath. Rilla Baller, MD  Active Self  amiodarone  (PACERONE ) 200 MG tablet [id number] Yes Take 1 tablet (200 mg total) by mouth daily. Cindie Ole DASEN, MD  Active Self  amoxicillin -clavulanate (AUGMENTIN ) 875-125 MG tablet [id number] Yes Take 1 tablet by mouth 2 (two) times daily for 7 days. Barbarann Nest, MD  Active   azithromycin  (ZITHROMAX ) 500 MG tablet [id number] Yes Take 1 tablet (500 mg total) by mouth daily for 4 days. Barbarann Nest, MD  Active   Blood Glucose Monitoring Suppl (ACCU-CHEK GUIDE ME) w/Device KIT [id number] Yes 2 (two) times daily. [provider]  Active Self  Blood Glucose Monitoring Suppl DEVI [id number] Yes 1 each by Does not apply route in the morning, at noon, and at bedtime. May substitute to any manufacturer covered by patient's insurance. Rilla Baller, MD  Active Self  budesonide -glycopyrrolate -formoterol  (BREZTRI  AEROSPHERE) 160-9-4.8 MCG/ACT AERO inhaler [id number] Yes Inhale 2 puffs into the lungs 2 (two) times daily. Rilla Baller, MD  Active Self           Med Note CARALYN, [facility] R   Wed Dec 14, 2023  3:20 PM) [place] via PAP [place]  Cholecalciferol  (VITAMIN D ) 50 MCG (2000 UT) CAPS [id number] Yes Take 1 capsule (2,000 Units total) by mouth daily. Rilla Baller, MD  Active Self  dapagliflozin  propanediol (FARXIGA ) 10 MG TABS tablet [id number] Yes Take 1 tablet (10 mg total) by mouth daily before breakfast. Donette Ellouise LABOR, FNP  Active Self           Med Note CARALYN, [name] R   Wed Dec 14, 2023  3:20 PM) [place] via [place]&Me PAP   Docusate Calcium (STOOL SOFTENER PO) [id number] Yes Take  1 tablet by mouth daily. [provider]  Active Self  FLUoxetine  (PROZAC ) 40 MG capsule [id number] Yes TAKE 1 CAPSULE (40 MG TOTAL) BY MOUTH DAILY. Rilla Baller, MD  Active Self  fluticasone  (FLONASE ) 50 MCG/ACT nasal spray [id number] Yes Place 2 sprays into both nostrils daily.   Patient taking differently: Place 2 sprays into both nostrils as needed.   Rilla Baller, MD  Active Self           Med Note GROVER BURNARD RAMAN   Fri Apr 09, 2022  1:23 PM)    furosemide  (LASIX ) 80 MG tablet [id number] Yes Take 1 tablet (80 mg total) by mouth daily. With second dose as needed for fluid retention, leg swelling Rilla Baller, MD  Active Self  glimepiride  (AMARYL ) 4 MG tablet [id number] Yes Take 0.5 tablets (2 mg total) by mouth daily with breakfast.  Patient taking differently: Take 2 mg by mouth daily with breakfast. Patient states he takes 1 tablet daily   Rilla Baller, MD  Active Self  Glucose Blood (BLOOD GLUCOSE TEST STRIPS) STRP [id number] Yes 1 each by In Vitro route in the morning, at noon, and at bedtime. May substitute to any manufacturer covered by patient's insurance. Rilla Baller, MD  Active Self  guaiFENesin  (MUCINEX ) 600 MG 12 hr tablet [id number] Yes Take 1 tablet (600 mg total) by mouth 2 (two) times daily. Barbarann Nest, MD  Active   ipratropium-albuterol  (DUONEB) 0.5-2.5 (3) MG/3ML LARRAINE [id number] Yes TAKE 3 MLS BY NEBULIZATION EVERY 6 (SIX) HOURS AS NEEDED (SEVERE SHORTNESS OF BREATH/WHEEZING). Rilla Baller, MD  Active Self  Lancet Device MISC [id number] Yes 1 each by Does not apply route in the morning, at noon, and at bedtime. May substitute to any manufacturer covered by patient's insurance. Rilla Baller, MD  Active Self  Lancets Misc. MISC [id number] Yes 1 each by Does not apply route in the morning, at noon, and at bedtime. May substitute to any manufacturer covered by patient's insurance. Rilla Baller, MD  Active Self  latanoprost  (XALATAN ) 0.005 % ophthalmic solution [id number] Yes Place 1 drop into both eyes at bedtime. Rilla Baller, MD  Active Self  metFORMIN  (GLUCOPHAGE ) 500 MG tablet [id number] Yes Take 1 tablet (500 mg total) by mouth daily with breakfast. Rilla Baller, MD  Active Self  methocarbamol  (ROBAXIN ) 500 MG  tablet [id number] Yes Take 1 tablet (500 mg total) by mouth 3 (three) times daily as needed for muscle spasms. Rilla Baller, MD  Active Self  metolazone  (ZAROXOLYN ) 2.5 MG tablet [id number] Yes Take 1 tablet (2.5 mg total) by mouth once a week. As needed for leg swelling Rilla Baller, MD  Active Self  montelukast  (SINGULAIR ) 10 MG tablet [id number] Yes Take 1 tablet (10 mg total) by mouth at bedtime. Rilla Baller, MD  Active Self  OXYGEN  [id number] Yes Inhale 2 L into the lungs at bedtime. [provider]  Active Self  pantoprazole  (PROTONIX ) 40 MG tablet [id number] Yes Take 1 tablet (40 mg total) by mouth 2 (two) times daily before a meal. Rilla Baller, MD  Active Self  potassium chloride  (MICRO-K ) 10 MEQ CR capsule [id number] Yes Take 1 capsule (10 mEq total) by mouth daily. Rilla Baller, MD  Active Self  Semaglutide ,0.25 or 0.5MG /DOS, (OZEMPIC , 0.25 OR 0.5 MG/DOSE,) 2 MG/1.5ML SOPN [id number]  Inject 0.5 mg into the skin once a week.  Patient not taking: Reported on 01/06/2024   Rilla Baller, MD  Active Self  simvastatin  (ZOCOR ) 20 MG tablet [id number] Yes Take 1 tablet (  20 mg total) by mouth every evening. Rilla Baller, MD  Active Self  triamcinolone  cream (KENALOG ) 0.1 % [id number]  Apply 1 Application topically 2 (two) times daily. For no more than 10 days at a time  Patient not taking: Reported on 01/06/2024   Rilla Baller, MD  Active Self  vitamin B-12 (CYANOCOBALAMIN ) 1000 MCG tablet [id number] Yes Take 1 tablet (1,000 mcg total) by mouth every Monday, Wednesday, and Friday. Rilla Baller, MD  Active Self  warfarin (COUMADIN ) 5 MG tablet [id number]  TAKE 1 TABLET BY MOUTH DAILY EXCEPT TAKE 1/2 TABLET ON MONDAY, WEDNESDAY AND SATURDAY OR AS DIRECTED BY ANTICOAGULATION CLINIC  Patient not taking: Reported on 01/06/2024   Rilla Baller, MD  Active Self           Med Note SAMIR, ISHAQ Jan 04, 2024 10:04 PM) [facility] clinic instructed him to  hold until 9/13, then will change to 5mg  on M-W-F and 2.5mg  all other days   Med List Note Luna Dorothe LABOR, RN 01/05/18 1237): UDS 01/05/18            Home Care and Equipment/Supplies: Were Home Health Services Ordered?: No Any new equipment or medical supplies ordered?: No  Functional Questionnaire: Do you need assistance with bathing/showering or dressing?: No Do you need assistance with meal preparation?: No Do you need assistance with eating?: No Do you have difficulty maintaining continence: No Do you need assistance with getting out of bed/getting out of a chair/moving?: No Do you have difficulty managing or taking your medications?: No  Follow up appointments reviewed: PCP Follow-up appointment confirmed?: No (patient states he will call and discuss his hospital follow up visit with his primary care office.) Specialist Hospital Follow-up appointment confirmed?: NA Do you need transportation to your follow-up appointment?: No Do you understand care options if your condition(s) worsen?: Yes-patient verbalized understanding  SDOH Interventions Today    Flowsheet Row Most Recent Value  SDOH Interventions   Food Insecurity Interventions Intervention Not Indicated  Housing Interventions Intervention Not Indicated  Transportation Interventions Intervention Not Indicated  Utilities Interventions Intervention Not Indicated   Discussed and offered 30 day TOC program.  Patient  verbally agreed.  The patient has been provided with contact information for the care management team and has been advised to call with any health -related questions or concerns.  The patient verbalized understanding with current plan of care.  The patient is directed to their insurance card regarding availability of benefits coverage.    Arvin Seip RN, BSN, CCM [employer], Population Health Case Manager Phone: [phone]

## 2024-01-09 LAB — CULTURE, BLOOD (ROUTINE X 2)
Culture: NO GROWTH
Culture: NO GROWTH

## 2024-01-09 NOTE — Telephone Encounter (Signed)
 Noted! Thank you

## 2024-01-09 NOTE — Telephone Encounter (Signed)
 LVM to contact coumadin  clinic.

## 2024-01-09 NOTE — Telephone Encounter (Addendum)
 Pt reports he held warfarin until yesterday, 9/14, while taking azithromycin . Also c/o hemoptysis for the last 3 days, but very little this morning. This is why he restarted warfarin at 1/2 tablet. He restarted warfarin with 1/2 tablet yesterday and 1/2 tablet today.   Finished with azithromycin  and will finish Augmentin  on 9/19.  He reports he remembers his warfarin dosing as 1 tablet daily, and 1/2 tablet on Monday, Wednesday and Saturday. This is not correct and is 1/2 tablet more in his weekly dosing that was prescribed at last coumadin  clinic apt.   Advised pt to change dosing to take 1/2 tablet daily except take 1 tablet on Tuesday, Thursday, Sunday. Scheduled pt for coumadin  clinic for 9/18. He reports he may have difficulty making this apt but will request a ride from a friend. He will also need a portable O2 tank which he does not have yet. Advised if he cannot make it for INR check to contact the coumadin  clinic.   Pt denies any other s/s of abnormal bruising or bleeding currently. Advised if any s/s of if hemoptysis worsens again to go to ER. Pt verbalized understanding.

## 2024-01-11 ENCOUNTER — Other Ambulatory Visit: Payer: Self-pay

## 2024-01-11 DIAGNOSIS — Z7901 Long term (current) use of anticoagulants: Secondary | ICD-10-CM

## 2024-01-11 NOTE — Patient Instructions (Signed)
 Visit Information  Thank you for taking time to visit with me today. Please don't hesitate to contact me if I can be of assistance to you before our next scheduled telephone appointment.  Our next appointment is by telephone on 01/18/24 at 3 pm  Following is a copy of your care plan:   Goals Addressed             This Visit's Progress    VBCI Transitions of Care (TOC) Care Plan       Problems:  Recent Hospitalization for treatment of pneumonia Knowledge Deficit Related to management of pneumonia, DME- patient states he doesn't know how to use his portable oxygen  tanks and No Hospital Follow Up Provider appointment patient states he will talk with his primary care office regarding a follow up visit. - patient continues wearing his oxygen  at 2 L 24/7.  Patient states his breathing has gotten much better. He reports his oxygen  level drops to 87 when off of his oxygen .    Goal:  Over the next 30 days, the patient will not experience hospital readmission  Interventions:  Transitions of Care: Durable Medical Equipment (DME) reviewed with patient/caregiver, needs identified and provider notified, and education provided Doctor Visits  - discussed the importance of doctor visits Call to [facility]. Spoke with Summer and request cylinder cart be delivered to patient for portable tank transport and requested patient be instructed on how to fill portable tank from concentrator. Clotilda state she notified the [place], [place] branch for [facility] of the request for the cylinder cart and need for instruction to fill portable tank. Clotilda states someone will outreach to patient on 01/12/24 at least 30 minutes before delivery.    Assessed if patient contacted provider or coumadin  clinic regarding coumadin  dosing.  Patient reports talking with Clotilda at the coumadin  clinic on 01/10/24. He states he was advised to take   coumadin  5mg , 1/2 tablet daily and follow up with the coumadin  clinic on 01/12/24.    Second offer  to assist patient with setting up hospital follow up visit. Patient declined.  Advised to call and schedule primary care hospital follow up visit.  Advised to call provider for worsening symptoms or call 911 for severe symptoms Advised to monitor oxygen  level daily and wear oxygen  as prescribed/ recommended (Per provider recommendation: Wear oxygen  continuously or as needed to maintain saturations of 88-92%) Advised to follow up with provider in 3-4 weeks for a chest xray follow up Advised to use incentive spirometer at least 3 times per day Assessed patients oxygen  level.  Advised ongoing oxygen  saturation monitoring daily Assessed for new/ ongoing symptoms.    Patient Self Care Activities:  Attend all scheduled provider appointments Call pharmacy for medication refills 3-7 days in advance of running out of medications Call provider office for new concerns or questions  Notify RN Care Manager of TOC call rescheduling needs Participate in Transition of Care Program/Attend TOC scheduled calls Take medications as prescribed   Call 911 for severe symptoms: SOB, chest pain, stroke like symptoms. (Per provider recommendation: Wear oxygen  continuously or as needed to maintain saturations of 88-92%) Call [facility] with questions or if you have a conflict in schedule regarding delivery time.   Plan:  Telephone follow up appointment with care management team member scheduled for:  01/18/24 at 3 pm The patient has been provided with contact information for the care management team and has been advised to call with any health related questions or concerns.  Patient verbalizes understanding of instructions and care plan provided today and agrees to view in MyChart. Active MyChart status and patient understanding of how to access instructions and care plan via MyChart confirmed with patient.     The patient has been provided with contact information for the care management  team and has been advised to call with any health related questions or concerns.   Please call the care guide team at [phone] if you need to cancel or reschedule your appointment.   Please call the Suicide and Crisis Lifeline: 988 call the [place]  National Suicide Prevention Lifeline: 620-662-9064 or TTY: 607-093-0644 TTY (508) 061-7999) to talk to a trained counselor call 1-800-273-TALK (toll free, 24 hour hotline) go to [facility] [address] [place] [phone]) if you are experiencing a Mental Health or Behavioral Health Crisis or need someone to talk to.  Arvin Seip RN, BSN, CCM [employer], Population Health Case Manager Phone: [phone]

## 2024-01-11 NOTE — Transitions of Care (Post Inpatient/ED Visit) (Signed)
 Transition of Care week 2  Visit Note  01/11/2024  Name: [name] MRN: [id number]          DOB: 07/02/1955  Situation: Patient enrolled in [facility] 30-day program. Visit completed with patient by telephone.   Background: patient hospitalized 01/04/24 to 01/05/24 for right upper lobe pneumonia     Past Medical History:  Diagnosis Date   Adrenal nodule (HCC) 12/28/2021   a.) CT abd/pel 12/28/2021: LEFT adrenal nodule measuring 1.6 cm   Anemia    Angina    Anxiety    Aortic atherosclerosis (HCC)    Arthritis    Atrial fibrillation and flutter (HCC) 2013   a.) CHA2DS2VASc = 5 (age, CHF, HTN, vascular disease history, T2DM);  b.) s/p DCCV (100 J) 10/27/2010; c.) s/p ablation 09/21/2011; d.) rate/rhythm maintained on oral amiodarone  + carvedilol ; chronically anticoagulated with warfarin   CAD (coronary artery disease) 07/15/2010   a.) LHC 07/15/2010: 30% pLAD, 40% D1, 25%/30% pLCx, 30% mLCx - med mgmt   CHF (congestive heart failure) - NYHA Class II/III 2013   a.) TTE 02/26/2013: EF 50%, mild MR/TR; b.) EST 06/13/2017: EF 50%, mild LCH, triv MR/TR; c.) TTTE 03/24/2020: EF >55%, mild LVH, mild MAC, mild BAE, triv PR, mild MR/TR, G1DD; d.) TTE 11/19/2021: EF 60-65%, mod reduced RVSF, mild LAE, mod MAC, Ao root 37 mm, G2DD   COPD (chronic obstructive pulmonary disease) (HCC)    Diabetes type 2, uncontrolled    declines DSME   Dilated cardiomyopathy (HCC) 2013   a.) EF 25% at time of Dx in 2013; b.) TTE 02/26/2013: EF 50%; c.) EST 06/13/2017: EF 50%;  d.) TTE 03/24/2020: EF >55%; e.) TTE 11/19/2021: EF 60-65%   Ex-smoker    GERD (gastroesophageal reflux disease)    Hepatic steatosis    Hyperlipidemia    Hypertension    Infrarenal abdominal aortic aneurysm (AAA) without rupture (HCC) 08/11/2016   a.) CT abd/pel 08/11/2016: 3.3 cm; b.) CT abd/pel 12/22/2018: 3.3 cm; c.) CT abd/pel 08/31/2021: 3.7 cm; d.) CT abd/pel 12/28/2021: 3.9 cm   Long term current use of amiodarone  2023   a.)  previously on dranaderone (cost prohibitive); switched to amiodarone  2023   Long term current use of anticoagulant    a.) warfarin   Marijuana use    a.) UDS (+) of THC x 3 (02/06/2013, 06/29/2013, 09/03/2013); PCP advised one more and we will stop prescribing ativan  (08/2013)   Migraine    Narcolepsy    Obesity    OSA (obstructive sleep apnea)    a.) unable to tolerate nocturnal PAP therapy; uses supplemetal oxygen  at 2L/[place] during sleep   Seasonal allergies    Umbilical hernia     Assessment: Patient Reported Symptoms: Cognitive Cognitive Status: No symptoms reported, Alert and oriented to person, place, and time, Insightful and able to interpret abstract concepts, Normal speech and language skills      Neurological Neurological Review of Symptoms: No symptoms reported    HEENT HEENT Symptoms Reported: No symptoms reported      Cardiovascular Cardiovascular Symptoms Reported: No symptoms reported    Respiratory Respiratory Symptoms Reported: Productive cough, Shortness of breath Additional Respiratory Details: Patient states he is doing much better. He reports his breathing has gotten better.  patient states his oxygen  level on RA over 30 minutes is 87.  Patient states this is much improved from last week. He state he still has a mild cough with production.  He reports having a scant amount of blood  is sputum x 1.  Patient states his cough is also much improved.  He reports he continues to take his medications and uses his nebulizer as prescribed. Respiratory Management Strategies: Medication therapy, Oxygen  therapy, Routine screening, Breathing exercise  Endocrine Endocrine Symptoms Reported: No symptoms reported    Gastrointestinal Gastrointestinal Symptoms Reported: No symptoms reported      Genitourinary Genitourinary Symptoms Reported: No symptoms reported    Integumentary Integumentary Symptoms Reported: No symptoms reported    Musculoskeletal Musculoskelatal Symptoms  Reviewed: No symptoms reported        Psychosocial Psychosocial Symptoms Reported: No symptoms reported         There were no vitals filed for this visit.  Medications Reviewed Today     Reviewed by Lukis Bunt E, RN (Registered Nurse) on 01/11/24 at 1152  Med List Status: <None>   Medication Order Taking? Sig Documenting Provider Last Dose Status Informant  acetaminophen  (TYLENOL ) 500 MG tablet [id number] Yes Take 2 tablets (1,000 mg total) by mouth every 6 (six) hours as needed for mild pain. Desiderio Schanz, MD  Active Self  albuterol  (VENTOLIN  HFA) 108 (90 Base) MCG/ACT inhaler [id number] Yes Inhale 2 puffs into the lungs every 6 (six) hours as needed for wheezing or shortness of breath. Rilla Baller, MD  Active Self  amiodarone  (PACERONE ) 200 MG tablet [id number] Yes Take 1 tablet (200 mg total) by mouth daily. Cindie Ole DASEN, MD  Active Self  amoxicillin -clavulanate (AUGMENTIN ) 875-125 MG tablet [id number] Yes Take 1 tablet by mouth 2 (two) times daily for 7 days. Barbarann Nest, MD  Active   Blood Glucose Monitoring Suppl (ACCU-CHEK GUIDE ME) w/Device KIT [id number] Yes 2 (two) times daily. [provider]  Active Self  Blood Glucose Monitoring Suppl DEVI [id number] Yes 1 each by Does not apply route in the morning, at noon, and at bedtime. May substitute to any manufacturer covered by patient's insurance. Rilla Baller, MD  Active Self  budesonide -glycopyrrolate -formoterol  (BREZTRI  AEROSPHERE) 160-9-4.8 MCG/ACT AERO inhaler [id number] Yes Inhale 2 puffs into the lungs 2 (two) times daily. Rilla Baller, MD  Active Self           Med Note CARALYN, [facility] R   Wed Dec 14, 2023  3:20 PM) [place] via PAP [place]  Cholecalciferol  (VITAMIN D ) 50 MCG (2000 UT) CAPS [id number] Yes Take 1 capsule (2,000 Units total) by mouth daily. Rilla Baller, MD  Active Self  dapagliflozin  propanediol (FARXIGA ) 10 MG TABS tablet [id number] Yes Take 1 tablet (10 mg total) by mouth daily  before breakfast. Donette Ellouise LABOR, FNP  Active Self           Med Note CARALYN, [name] R   Wed Dec 14, 2023  3:20 PM) [place] via [place]&Me PAP   Docusate Calcium (STOOL SOFTENER PO) [id number] Yes Take 1 tablet by mouth daily. [provider]  Active Self  FLUoxetine  (PROZAC ) 40 MG capsule [id number] Yes TAKE 1 CAPSULE (40 MG TOTAL) BY MOUTH DAILY. Rilla Baller, MD  Active Self  fluticasone  (FLONASE ) 50 MCG/ACT nasal spray [id number]  Place 2 sprays into both nostrils daily.  Patient taking differently: Place 2 sprays into both nostrils as needed.   Rilla Baller, MD  Active Self           Med Note GROVER BURNARD RAMAN   Fri Apr 09, 2022  1:23 PM)    furosemide  (LASIX ) 80 MG tablet [id number] Yes Take 1 tablet (80 mg total) by mouth daily. With second dose as needed for fluid  retention, leg swelling Rilla Baller, MD  Active Self  glimepiride  (AMARYL ) 4 MG tablet [id number] Yes Take 0.5 tablets (2 mg total) by mouth daily with breakfast.  Patient taking differently: Take 2 mg by mouth daily with breakfast. Patient states he takes 1 tablet daily   Rilla Baller, MD  Active Self  Glucose Blood (BLOOD GLUCOSE TEST STRIPS) STRP [id number] Yes 1 each by In Vitro route in the morning, at noon, and at bedtime. May substitute to any manufacturer covered by patient's insurance. Rilla Baller, MD  Active Self  guaiFENesin  (MUCINEX ) 600 MG 12 hr tablet [id number] Yes Take 1 tablet (600 mg total) by mouth 2 (two) times daily. Barbarann Nest, MD  Active   ipratropium-albuterol  (DUONEB) 0.5-2.5 (3) MG/3ML LARRAINE [id number] Yes TAKE 3 MLS BY NEBULIZATION EVERY 6 (SIX) HOURS AS NEEDED (SEVERE SHORTNESS OF BREATH/WHEEZING). Rilla Baller, MD  Active Self  Lancet Device MISC [id number] Yes 1 each by Does not apply route in the morning, at noon, and at bedtime. May substitute to any manufacturer covered by patient's insurance. Rilla Baller, MD  Active Self  Lancets Misc. MISC [id number] Yes 1  each by Does not apply route in the morning, at noon, and at bedtime. May substitute to any manufacturer covered by patient's insurance. Rilla Baller, MD  Active Self  latanoprost  (XALATAN ) 0.005 % ophthalmic solution [id number] Yes Place 1 drop into both eyes at bedtime. Rilla Baller, MD  Active Self  metFORMIN  (GLUCOPHAGE ) 500 MG tablet [id number] Yes Take 1 tablet (500 mg total) by mouth daily with breakfast. Rilla Baller, MD  Active Self  methocarbamol  (ROBAXIN ) 500 MG tablet [id number] Yes Take 1 tablet (500 mg total) by mouth 3 (three) times daily as needed for muscle spasms. Rilla Baller, MD  Active Self  metolazone  (ZAROXOLYN ) 2.5 MG tablet [id number] Yes Take 1 tablet (2.5 mg total) by mouth once a week. As needed for leg swelling Rilla Baller, MD  Active Self  montelukast  (SINGULAIR ) 10 MG tablet [id number] Yes Take 1 tablet (10 mg total) by mouth at bedtime. Rilla Baller, MD  Active Self  OXYGEN  [id number] Yes Inhale 2 L into the lungs at bedtime. [provider]  Active Self  pantoprazole  (PROTONIX ) 40 MG tablet [id number] Yes Take 1 tablet (40 mg total) by mouth 2 (two) times daily before a meal. Rilla Baller, MD  Active Self  potassium chloride  (MICRO-K ) 10 MEQ CR capsule [id number] Yes Take 1 capsule (10 mEq total) by mouth daily. Rilla Baller, MD  Active Self  Semaglutide ,0.25 or 0.5MG /DOS, (OZEMPIC , 0.25 OR 0.5 MG/DOSE,) 2 MG/1.5ML SOPN [id number]  Inject 0.5 mg into the skin once a week.  Patient not taking: Reported on 01/11/2024   Rilla Baller, MD  Active Self  simvastatin  (ZOCOR ) 20 MG tablet [id number] Yes Take 1 tablet (20 mg total) by mouth every evening. Rilla Baller, MD  Active Self  triamcinolone  cream (KENALOG ) 0.1 % [id number]  Apply 1 Application topically 2 (two) times daily. For no more than 10 days at a time  Patient not taking: Reported on 01/11/2024   Rilla Baller, MD  Active Self  vitamin B-12 (CYANOCOBALAMIN ) 1000  MCG tablet [id number]  Take 1 tablet (1,000 mcg total) by mouth every Monday, Wednesday, and Friday. Rilla Baller, MD  Active Self  warfarin (COUMADIN ) 5 MG tablet [id number] Yes TAKE 1 TABLET BY MOUTH DAILY EXCEPT TAKE 1/2 TABLET ON MONDAY, WEDNESDAY AND SATURDAY OR AS DIRECTED BY ANTICOAGULATION CLINIC  Patient taking differently: 5 mg. Patient states he is  take 1/2 tablet daily at the advise of the coumadin  clinic   Rilla Baller, MD  Active Self           Med Note KAIRO, LAUBACHER Jan 04, 2024 10:04 PM) [facility] clinic instructed him to hold until 9/13, then will change to 5mg  on M-W-F and 2.5mg  all other days   Med List Note Luna Dorothe LABOR, RN 01/05/18 1237): UDS 01/05/18             Goals Addressed             This Visit's Progress    [facility]I Transitions of Care (TOC) Care Plan       Problems:  Recent Hospitalization for treatment of pneumonia Knowledge Deficit Related to management of pneumonia, DME- patient states he doesn't know how to use his portable oxygen  tanks and No Hospital Follow Up Provider appointment patient states he will talk with his primary care office regarding a follow up visit. - patient continues wearing his oxygen  at 2 L 24/7.  Patient states his breathing has gotten much better. He reports his oxygen  level drops to 87 when off of his oxygen .    Goal:  Over the next 30 days, the patient will not experience hospital readmission  Interventions:  Transitions of Care: Durable Medical Equipment (DME) reviewed with patient/caregiver, needs identified and provider notified, and education provided Doctor Visits  - discussed the importance of doctor visits Call to [facility]. Spoke with Summer and request cylinder cart be delivered to patient for portable tank transport and requested patient be instructed on how to fill portable tank from concentrator. Clotilda state she notified the [place] branch for [facility] of the request for the cylinder  cart and need for instruction to fill portable tank. Clotilda states someone will outreach to patient on 01/12/24 at least 30 minutes before delivery.    Assessed if patient contacted provider or coumadin  clinic regarding coumadin  dosing.  Patient reports talking with Clotilda at the coumadin  clinic on 01/10/24. He states he was advised to take   coumadin  5mg , 1/2 tablet daily and follow up with the coumadin  clinic on 01/12/24.   Second offer  to assist patient with setting up hospital follow up visit. Patient declined.  Advised to call and schedule primary care hospital follow up visit.  Advised to call provider for worsening symptoms or call 911 for severe symptoms Advised to monitor oxygen  level daily and wear oxygen  as prescribed/ recommended (Per provider recommendation: Wear oxygen  continuously or as needed to maintain saturations of 88-92%) Advised to follow up with provider in 3-4 weeks for a chest xray follow up Advised to use incentive spirometer at least 3 times per day Assessed patients oxygen  level.  Advised ongoing oxygen  saturation monitoring daily Assessed for new/ ongoing symptoms.    Patient Self Care Activities:  Attend all scheduled provider appointments Call pharmacy for medication refills 3-7 days in advance of running out of medications Call provider office for new concerns or questions  Notify RN Care Manager of TOC call rescheduling needs Participate in Transition of Care Program/Attend TOC scheduled calls Take medications as prescribed   Call 911 for severe symptoms: SOB, chest pain, stroke like symptoms. (Per provider recommendation: Wear oxygen  continuously or as needed to maintain saturations of 88-92%) Call [facility] with questions or if you have a conflict in schedule regarding delivery time.   Plan:  Telephone follow up appointment with care management team member scheduled for:  01/18/24 at 3 pm The patient has been provided with contact information for the care  management team and has been advised to call with any health related questions or concerns.         Recommendation:   PCP Follow-up Continue Current Plan of Care  Follow Up Plan:   Telephone follow-up in 1 week 01/18/24 at 3:00 pm  Arvin Seip RN, BSN, CCM   [facility], Population Health Case Manager Phone: [phone]

## 2024-01-12 ENCOUNTER — Ambulatory Visit (INDEPENDENT_AMBULATORY_CARE_PROVIDER_SITE_OTHER)

## 2024-01-12 ENCOUNTER — Telehealth: Payer: Self-pay | Admitting: Family Medicine

## 2024-01-12 DIAGNOSIS — Z7901 Long term (current) use of anticoagulants: Secondary | ICD-10-CM

## 2024-01-12 LAB — POCT INR: INR: 2.1 (ref 2.0–3.0)

## 2024-01-12 NOTE — Progress Notes (Signed)
 Conversation with pt on 9/15. Pt reports he held warfarin until yesterday, 9/14, while taking azithromycin . Also c/o hemoptysis for the last 3 days, but very little this morning. This is why he restarted warfarin at 1/2 tablet. He restarted warfarin with 1/2 tablet yesterday and 1/2 tablet today. He reports he remembers his warfarin dosing as 1 tablet daily, and 1/2 tablet on Monday, Wednesday and Saturday. This is not correct and is 1/2 tablet more in his weekly dosing that was prescribed at last coumadin  clinic apt.   Pt reports today after the phone conversation on 9/15 he had hemoptysis again so he has only been taking 1/2 tablet of warfarin since that day. Pt denies any more hemoptysis after that time.  Pt is in range today so he will continue 1/2 tablet daily and recheck in 1 week. Advised if any further hemoptysis or any other bleeding or abnormal bruising to go to ER and contact the office. Pt verbalized understanding.  Pt has a f/u apt with PCP tomorrow. Pt did not have any O2 on today. He reports he has not received his O2 tank yet. Advised they are to contact him today. He reports his O2sat is doing much better. When he left his home he reports O2 sat was 90% and he did not have any O2 on at that time. He did not display any SOB today at this apt. Advised to let PCP know if he does not receive the O2 tank.

## 2024-01-12 NOTE — Telephone Encounter (Signed)
 Please see anticoagulation clinic note regarding hemoptysis, Coumadin  dosing, and O2 tank availability.  He has an office visit scheduled with you on 01/13/24.  Thanks.

## 2024-01-12 NOTE — Patient Instructions (Addendum)
Pre visit review using our clinic review tool, if applicable. No additional management support is needed unless otherwise documented below in the visit note.  Continue 1/2 tablet daily and recheck in 1 week.

## 2024-01-13 ENCOUNTER — Ambulatory Visit
Admission: RE | Admit: 2024-01-13 | Discharge: 2024-01-13 | Disposition: A | Discharge status: Home / Self Care | Source: Ambulatory Visit | Attending: Family Medicine | Admitting: Family Medicine

## 2024-01-13 ENCOUNTER — Ambulatory Visit (INDEPENDENT_AMBULATORY_CARE_PROVIDER_SITE_OTHER): Admitting: Family Medicine

## 2024-01-13 ENCOUNTER — Encounter: Payer: Self-pay | Admitting: Family Medicine

## 2024-01-13 VITALS — BP 152/84 | HR 70 | Temp 97.8°F | Ht 69.0 in | Wt 240.2 lb

## 2024-01-13 DIAGNOSIS — E559 Vitamin D deficiency, unspecified: Secondary | ICD-10-CM | POA: Diagnosis not present

## 2024-01-13 DIAGNOSIS — J9611 Chronic respiratory failure with hypoxia: Secondary | ICD-10-CM | POA: Diagnosis not present

## 2024-01-13 DIAGNOSIS — G4733 Obstructive sleep apnea (adult) (pediatric): Secondary | ICD-10-CM | POA: Diagnosis not present

## 2024-01-13 DIAGNOSIS — I7121 Aneurysm of the ascending aorta, without rupture: Secondary | ICD-10-CM

## 2024-01-13 DIAGNOSIS — J189 Pneumonia, unspecified organism: Secondary | ICD-10-CM

## 2024-01-13 DIAGNOSIS — D500 Iron deficiency anemia secondary to blood loss (chronic): Secondary | ICD-10-CM

## 2024-01-13 DIAGNOSIS — Z23 Encounter for immunization: Secondary | ICD-10-CM | POA: Diagnosis not present

## 2024-01-13 DIAGNOSIS — R519 Headache, unspecified: Secondary | ICD-10-CM

## 2024-01-13 DIAGNOSIS — Z9981 Dependence on supplemental oxygen: Secondary | ICD-10-CM | POA: Diagnosis not present

## 2024-01-13 DIAGNOSIS — I480 Paroxysmal atrial fibrillation: Secondary | ICD-10-CM

## 2024-01-13 DIAGNOSIS — Z7901 Long term (current) use of anticoagulants: Secondary | ICD-10-CM

## 2024-01-13 DIAGNOSIS — I10 Essential (primary) hypertension: Secondary | ICD-10-CM

## 2024-01-13 DIAGNOSIS — D6869 Other thrombophilia: Secondary | ICD-10-CM | POA: Diagnosis not present

## 2024-01-13 DIAGNOSIS — I2721 Secondary pulmonary arterial hypertension: Secondary | ICD-10-CM | POA: Diagnosis not present

## 2024-01-13 LAB — IBC PANEL
Iron: 70 ug/dL (ref 42–165)
Saturation Ratios: 28.1 % (ref 20.0–50.0)
TIBC: 249.2 ug/dL — ABNORMAL LOW (ref 250.0–450.0)
Transferrin: 178 mg/dL — ABNORMAL LOW (ref 212.0–360.0)

## 2024-01-13 LAB — BASIC METABOLIC PANEL WITH GFR
BUN: 13 mg/dL (ref 6–23)
CO2: 34 meq/L — ABNORMAL HIGH (ref 19–32)
Calcium: 9.1 mg/dL (ref 8.4–10.5)
Chloride: 99 meq/L (ref 96–112)
Creatinine, Ser: 0.92 mg/dL (ref 0.40–1.50)
GFR: 85.58 mL/min (ref >60.00)
Glucose, Bld: 179 mg/dL — ABNORMAL HIGH (ref 70–99)
Potassium: 4.9 meq/L (ref 3.5–5.1)
Sodium: 140 meq/L (ref 135–145)

## 2024-01-13 LAB — CBC WITH DIFFERENTIAL/PLATELET
Basophils Absolute: 0 K/uL (ref 0.0–0.1)
Basophils Relative: 0.5 % (ref 0.0–3.0)
Eosinophils Absolute: 0.2 K/uL (ref 0.0–0.7)
Eosinophils Relative: 2.1 % (ref 0.0–5.0)
HCT: 39.3 % (ref 39.0–52.0)
Hemoglobin: 13 g/dL (ref 13.0–17.0)
Lymphocytes Relative: 13.1 % (ref 12.0–46.0)
Lymphs Abs: 1.2 K/uL (ref 0.7–4.0)
MCHC: 33.2 g/dL (ref 30.0–36.0)
MCV: 94.1 fl (ref 78.0–100.0)
Monocytes Absolute: 0.7 K/uL (ref 0.1–1.0)
Monocytes Relative: 7.2 % (ref 3.0–12.0)
Neutro Abs: 7.1 K/uL (ref 1.4–7.7)
Neutrophils Relative %: 77.1 % — ABNORMAL HIGH (ref 43.0–77.0)
Platelets: 348 K/uL (ref 150.0–400.0)
RBC: 4.18 Mil/uL — ABNORMAL LOW (ref 4.22–5.81)
RDW: 15.3 % (ref 11.5–15.5)
WBC: 9.2 K/uL (ref 4.0–10.5)

## 2024-01-13 LAB — VITAMIN D 25 HYDROXY (VIT D DEFICIENCY, FRACTURES): VITD: 21.04 ng/mL — ABNORMAL LOW (ref 30.00–100.00)

## 2024-01-13 LAB — FERRITIN: Ferritin: 147.7 ng/mL (ref 22.0–322.0)

## 2024-01-13 NOTE — Patient Instructions (Addendum)
 Flu shot today  Ambulatory pulse ox today.  Return in 3-4 weeks for repeat chest xray  - you may walk in between 10am-12pm or 2-4pm for this.  Good to see you today.  Keep November appointment

## 2024-01-13 NOTE — Telephone Encounter (Signed)
 Will discuss at OV today.

## 2024-01-13 NOTE — Progress Notes (Signed)
 Ph: [phone] Fax: [phone]   Patient ID: [name], male    DOB: [birth year], 68 y.o.   MRN: [id number]  This visit was conducted in person.  BP (!) 152/84   Pulse 70   Temp 97.8 F (36.6 C) (Oral)   Ht 5' 9 (1.753 m)   Wt 240 lb 4 oz (109 kg)   SpO2 92%   BMI 35.48 kg/m  off oxygen . BP Readings from Last 3 Encounters:  01/13/24 (!) 152/84  01/05/24 130/86  01/04/24 (!) 170/100   CC: follow up hospitalization  Subjective:   HPI: [name] is a 68 y.o. male presenting on 01/13/2024 for Medical Management of Chronic Issues (Here for f/u)   Recent hospitalization for RUL CAP with acute on chronic resp failure with hypoxia, with hemoptysis in setting of supratherapeutic INR, chest CT showed dense RUL pneumonia treated initially with IV rocephin , doxycycline , azithromycin , discharged to complete oral azithromycin  and augmentin  course which he has completed. Hospital records reviewed. Med rec performed.  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   Legionella, strep negative, Blood cultures x2 negative, HIV negative.   Continues using daily oxygen  - to maintain O2 sats >92%. He uses [employer] for DME.  He has large concentrator at home as well as 2 tanks.  He needs a cart for portable oxygen  tank.  He requests [employer] POC.   Still has not been contacted for ENT evaluation of chronic R sided headache r/o sinus issue. Referral placed 823/2025.   Coumadin  - has restarted 2.5mg  daily. Planned rpt INR next week.  Lab Results  Component Value Date   INR 2.1 01/12/2024   INR 4.3 (HH) 01/05/2024   INR 4.2 (HH) 01/04/2024    Since home, overall feeling well but does feel more rundown, wonders if he needs iron  transfusion. Last iron  infusion x2 10/2023.   Home health not set  up.  Other follow up appointments scheduled: none ______________________________________________________________________ Hospital admission: 01/04/2024 Hospital discharge: 01/05/2024 TCM f/u phone call:  performed on 01/06/2024  Recommendations at discharge:  Complete antibiotics - start tomorrow and take Augmentin  twice daily x 7 days and Azithromycin  once daily for another 4 days Wear oxygen  continuously or as needed to maintain saturations of 88-92% Return to the ER should symptoms worsen You will need a follow up chest xray in 3-4 weeks You also need annual follow up imaging of your thoracic aortic aneurysm Follow up within 1 week with Dr. Rilla Hold warfarin until 9/13 and then change regimen to 5mg  on M-W-F, 2.5mg  all other days; follow up with Coumadin  Clinic as planned   Discharge Diagnoses: Principal Problem:   Right upper lobe pneumonia Active Problems:   Acute on chronic respiratory failure with hypoxia (HCC)   Hemoptysis   SIRS (systemic inflammatory response syndrome) (HCC)   Paroxysmal atrial fibrillation (HCC)   Supratherapeutic INR   Right-sided headache   COPD (chronic obstructive pulmonary disease) (HCC)   HFrEF with improved EF (previously 25%, most recently 55% 12/2022)   Cardiomyopathy, dilated (HCC)   Obstructive sleep apnea   CAD with history of MI   Thoracic ascending aortic aneurysm (HCC)   Hypertension   Type 2 diabetes mellitus with other specified complication (HCC)     Relevant past medical, surgical, family and social history reviewed and updated as indicated. Interim  medical history since our last visit reviewed. Allergies and medications reviewed and updated. Outpatient Medications Prior to Visit  Medication Sig Dispense Refill   acetaminophen  (TYLENOL ) 500 MG tablet Take 2 tablets (1,000 mg total) by mouth every 6 (six) hours as needed for mild pain.     albuterol  (VENTOLIN  HFA) 108 (90 Base) MCG/ACT inhaler Inhale 2 puffs into the lungs every  6 (six) hours as needed for wheezing or shortness of breath. 8.5 each 6   amiodarone  (PACERONE ) 200 MG tablet Take 1 tablet (200 mg total) by mouth daily. 90 tablet 3   amoxicillin -clavulanate (AUGMENTIN ) 875-125 MG tablet Take 1 tablet by mouth 2 (two) times daily for 7 days. 14 tablet 0   Blood Glucose Monitoring Suppl (ACCU-CHEK GUIDE ME) w/Device KIT 2 (two) times daily.     Blood Glucose Monitoring Suppl DEVI 1 each by Does not apply route in the morning, at noon, and at bedtime. May substitute to any manufacturer covered by patient's insurance. 1 each 0   budesonide -glycopyrrolate -formoterol  (BREZTRI  AEROSPHERE) 160-9-4.8 MCG/ACT AERO inhaler Inhale 2 puffs into the lungs 2 (two) times daily. 32.1 g 11   Cholecalciferol  (VITAMIN D ) 50 MCG (2000 UT) CAPS Take 1 capsule (2,000 Units total) by mouth daily. 30 capsule    dapagliflozin  propanediol (FARXIGA ) 10 MG TABS tablet Take 1 tablet (10 mg total) by mouth daily before breakfast. 90 tablet 3   Docusate Calcium (STOOL SOFTENER PO) Take 1 tablet by mouth daily.     FLUoxetine  (PROZAC ) 40 MG capsule TAKE 1 CAPSULE (40 MG TOTAL) BY MOUTH DAILY. 90 capsule 2   fluticasone  (FLONASE ) 50 MCG/ACT nasal spray Place 2 sprays into both nostrils daily. (Patient taking differently: Place 2 sprays into both nostrils as needed.) 48 mL 3   furosemide  (LASIX ) 80 MG tablet Take 1 tablet (80 mg total) by mouth daily. With second dose as needed for fluid retention, leg swelling 120 tablet 3   glimepiride  (AMARYL ) 4 MG tablet Take 0.5 tablets (2 mg total) by mouth daily with breakfast. (Patient taking differently: Take 2 mg by mouth daily with breakfast. Patient states he takes 1 tablet daily)     Glucose Blood (BLOOD GLUCOSE TEST STRIPS) STRP 1 each by In Vitro route in the morning, at noon, and at bedtime. May substitute to any manufacturer covered by patient's insurance. 300 each 0   guaiFENesin  (MUCINEX ) 600 MG 12 hr tablet Take 1 tablet (600 mg total) by mouth 2  (two) times daily. 60 tablet 0   ipratropium-albuterol  (DUONEB) 0.5-2.5 (3) MG/3ML SOLN TAKE 3 MLS BY NEBULIZATION EVERY 6 (SIX) HOURS AS NEEDED (SEVERE SHORTNESS OF BREATH/WHEEZING). 360 mL 0   Lancet Device MISC 1 each by Does not apply route in the morning, at noon, and at bedtime. May substitute to any manufacturer covered by patient's insurance. 300 each 0   Lancets Misc. MISC 1 each by Does not apply route in the morning, at noon, and at bedtime. May substitute to any manufacturer covered by patient's insurance. 300 each 0   latanoprost  (XALATAN ) 0.005 % ophthalmic solution Place 1 drop into both eyes at bedtime.     metFORMIN  (GLUCOPHAGE ) 500 MG tablet Take 1 tablet (500 mg total) by mouth daily with breakfast. 90 tablet 3   methocarbamol  (ROBAXIN ) 500 MG tablet Take 1 tablet (500 mg total) by mouth 3 (three) times daily as needed for muscle spasms. 30 tablet 3   metolazone  (ZAROXOLYN ) 2.5 MG tablet Take 1 tablet (2.5 mg total) by  mouth once a week. As needed for leg swelling 20 tablet 0   montelukast  (SINGULAIR ) 10 MG tablet Take 1 tablet (10 mg total) by mouth at bedtime. 90 tablet 4   OXYGEN  Inhale 2 L into the lungs at bedtime.     pantoprazole  (PROTONIX ) 40 MG tablet Take 1 tablet (40 mg total) by mouth 2 (two) times daily before a meal. 180 tablet 4   potassium chloride  (MICRO-K ) 10 MEQ CR capsule Take 1 capsule (10 mEq total) by mouth daily. 110 capsule 3   Semaglutide ,0.25 or 0.5MG /DOS, (OZEMPIC , 0.25 OR 0.5 MG/DOSE,) 2 MG/1.5ML SOPN Inject 0.5 mg into the skin once a week. 1.5 mL 6   simvastatin  (ZOCOR ) 20 MG tablet Take 1 tablet (20 mg total) by mouth every evening. 90 tablet 4   triamcinolone  cream (KENALOG ) 0.1 % Apply 1 Application topically 2 (two) times daily. For no more than 10 days at a time 45 g 0   vitamin B-12 (CYANOCOBALAMIN ) 1000 MCG tablet Take 1 tablet (1,000 mcg total) by mouth every Monday, Wednesday, and Friday.     warfarin (COUMADIN ) 5 MG tablet TAKE 1 TABLET BY  MOUTH DAILY EXCEPT TAKE 1/2 TABLET ON MONDAY, WEDNESDAY AND SATURDAY OR AS DIRECTED BY ANTICOAGULATION CLINIC (Patient taking differently: 5 mg. Patient states he is take 1/2 tablet daily at the advise of the coumadin  clinic) 105 tablet 1   No facility-administered medications prior to visit.     Per HPI unless specifically indicated in ROS section below Review of Systems  Objective:  BP (!) 152/84   Pulse 70   Temp 97.8 F (36.6 C) (Oral)   Ht 5' 9 (1.753 m)   Wt 240 lb 4 oz (109 kg)   SpO2 92%   BMI 35.48 kg/m   Wt Readings from Last 3 Encounters:  01/13/24 240 lb 4 oz (109 kg)  01/04/24 234 lb 12.6 oz (106.5 kg)  01/04/24 235 lb (106.6 kg)      Physical Exam Vitals and nursing note reviewed.  Constitutional:      Appearance: Normal appearance. He is not ill-appearing.  HENT:     Head: Normocephalic and atraumatic.     Mouth/Throat:     Mouth: Mucous membranes are moist.     Pharynx: Oropharynx is clear. No oropharyngeal exudate or posterior oropharyngeal erythema.  Eyes:     Extraocular Movements: Extraocular movements intact.     Conjunctiva/sclera: Conjunctivae normal.     Pupils: Pupils are equal, round, and reactive to light.  Cardiovascular:     Rate and Rhythm: Normal rate and regular rhythm.     Pulses: Normal pulses.     Heart sounds: Normal heart sounds. No murmur heard. Pulmonary:     Effort: Pulmonary effort is normal. No respiratory distress.     Breath sounds: Normal breath sounds. No wheezing, rhonchi or rales.  Musculoskeletal:     Right lower leg: No edema.     Left lower leg: No edema.  Skin:    General: Skin is warm and dry.     Findings: No rash.  Neurological:     Mental Status: He is alert.  Psychiatric:        Mood and Affect: Mood normal.        Behavior: Behavior normal.       Results for orders placed or performed in visit on 01/13/24  CBC with Differential/Platelet   Collection Time: 01/13/24 11:44 AM  Result Value Ref Range    WBC  9.2 4.0 - 10.5 K/uL   RBC 4.18 (L) 4.22 - 5.81 Mil/uL   Hemoglobin 13.0 13.0 - 17.0 g/dL   HCT 60.6 60.9 - 47.9 %   MCV 94.1 78.0 - 100.0 fl   MCHC 33.2 30.0 - 36.0 g/dL   RDW 84.6 88.4 - 84.4 %   Platelets 348.0 150.0 - 400.0 K/uL   Neutrophils Relative % 77.1 (H) 43.0 - 77.0 %   Lymphocytes Relative 13.1 12.0 - 46.0 %   Monocytes Relative 7.2 3.0 - 12.0 %   Eosinophils Relative 2.1 0.0 - 5.0 %   Basophils Relative 0.5 0.0 - 3.0 %   Neutro Abs 7.1 1.4 - 7.7 K/uL   Lymphs Abs 1.2 0.7 - 4.0 K/uL   Monocytes Absolute 0.7 0.1 - 1.0 K/uL   Eosinophils Absolute 0.2 0.0 - 0.7 K/uL   Basophils Absolute 0.0 0.0 - 0.1 K/uL  Basic metabolic panel with GFR   Collection Time: 01/13/24 11:44 AM  Result Value Ref Range   Sodium 140 135 - 145 mEq/L   Potassium 4.9 3.5 - 5.1 mEq/L   Chloride 99 96 - 112 mEq/L   CO2 34 (H) 19 - 32 mEq/L   Glucose, Bld 179 (H) 70 - 99 mg/dL   BUN 13 6 - 23 mg/dL   Creatinine, Ser 9.07 0.40 - 1.50 mg/dL   GFR 14.41 >39.99 mL/min   Calcium 9.1 8.4 - 10.5 mg/dL  Ferritin   Collection Time: 01/13/24 11:44 AM  Result Value Ref Range   Ferritin 147.7 22.0 - 322.0 ng/mL  IBC panel   Collection Time: 01/13/24 11:44 AM  Result Value Ref Range   Iron  70 42 - 165 ug/dL   Transferrin 821.9 (L) 212.0 - 360.0 mg/dL   Saturation Ratios 71.8 20.0 - 50.0 %   TIBC 249.2 (L) 250.0 - 450.0 mcg/dL  VITAMIN D  25 Hydroxy (Vit-D Deficiency, Fractures)   Collection Time: 01/13/24 11:44 AM  Result Value Ref Range   VITD 21.04 (L) 30.00 - 100.00 ng/mL   *Note: Due to a large number of results and/or encounters for the requested time period, some results have not been displayed. A complete set of results can be found in Results Review.    Assessment & Plan:   Problem List Items Addressed This Visit     Obstructive sleep apnea   Untreated due to CPAP intolerance. Has seen [facility] [place], last 12/2022      Relevant Orders   For home use only DME oxygen     Hypertension   BP mildly elevated today - he  continues daily lasix  and weekly metloazone.       Right-sided headache   Will check with referral team on ENT evaluation of chronic R sided headaches eval sinus contribution to this.       Iron  deficiency anemia   H/o symptomatic iron  deficiency anemia followed by hematology, last iron  infusion 10/2023.  Will update iron  panel with CBC.       Relevant Orders   CBC with Differential/Platelet (Completed)   Ferritin (Completed)   IBC panel (Completed)   Vitamin D  deficiency   Update levels on daily replacement. He thinks he's taking 2000 units daily.       Relevant Orders   VITAMIN D  25 Hydroxy (Vit-D Deficiency, Fractures) (Completed)   Paroxysmal atrial fibrillation (HCC)   Continue amiodarone , coumadin  through our coumadin  clinic.       Chronic hypoxic respiratory failure, on home oxygen  therapy (HCC)  Acute resp failure has resolved  Pulse ox at rest staying low 90s Ambulatory pulse ox - O2 sats  drop to 88% with ambulation, maintain low 90s while wearing oxygen . Recommend nocturnal oxygen  at 2L via [place] as well as PRN oxygen  with activity/exertion.  Will place new referral - for oxygen  tank cart as well as eavlaution for portable oxygen  concentrator DME: [employer]      Relevant Orders   For home use only DME oxygen    Acquired hypercoagulable state (HCC)   Recent supratherapeutic INR, was held during hemoptysis, currently on 2.5mg  daily with plan to recheck INR next week       Long-term (current) use of anticoagulants, INR goal 2.0-3.0   Recent supratherapeutic INR, was held during hemoptysis, currently on 2.5mg  daily with plan to recheck INR next week       Right upper lobe pneumonia - Primary   Completed augmentin /azithromycin  antibiotic course with benefit.  Overall feeling better, breathing back to baseline.  Will need rpt CRX in 3 wks - ordered.  He continues nocturnal oxygen  supplementation with daytime PRN. Will update  ambulatory pulse ox then make recommendations accordingly.       Relevant Orders   Basic metabolic panel with GFR (Completed)   DG Chest 2 View   For home use only DME oxygen    Thoracic ascending aortic aneurysm (HCC)   4.2cm ascending thoracic aortic aneurysm noted on CTA 12/2023 - rec yearly imaging      Pulmonary arterial hypertension (HCC)   Noted on CTA chest by dilated main pulmonary artery - anticipate untreated sleep apnea contributes, as well as COPD.  H/o moderately reduced R heart function and mild RV dilation, L heart overall unremarkable on latest echo 2023.       Relevant Orders   For home use only DME oxygen    Other Visit Diagnoses       Encounter for immunization       Relevant Orders   Flu vaccine HIGH DOSE PF(Fluzone Trivalent) (Completed)        No orders of the defined types were placed in this encounter.   Orders Placed This Encounter  Procedures   For home use only DME oxygen     DME: [employer] Recommend nocturnal oxygen  and PRN oxygen  with exertion  Also request evaluation for portable oxygen  concentrator    Length of Need:   12 Months    Mode or (Route):   Nasal cannula    Liters per Minute:   2    Frequency:   Continuous (stationary and portable oxygen  unit needed)    Oxygen  conserving device:   Yes    Oxygen  delivery system:   Gas   DG Chest 2 View    Standing Status:   Future    Number of Occurrences:   1    Expected Date:   02/12/2024    Expiration Date:   01/12/2025    Reason for Exam (SYMPTOM  OR DIAGNOSIS REQUIRED):   follow up pneumonia    Preferred imaging location?:   [facility]   Flu vaccine HIGH DOSE PF(Fluzone Trivalent)   CBC with Differential/Platelet   Basic metabolic panel with GFR   Ferritin   IBC panel   VITAMIN D  25 Hydroxy (Vit-D Deficiency, Fractures)    Patient Instructions  Flu shot today  Ambulatory pulse ox today.  Return in 3-4 weeks for repeat chest xray  - you may walk in between 10am-12pm or 2-4pm  for this.  Good  to see you today.  Keep November appointment   Follow up plan: No follow-ups on file.  Anton Blas, MD

## 2024-01-13 NOTE — Telephone Encounter (Signed)
 Left message to return call to our office.

## 2024-01-13 NOTE — Telephone Encounter (Signed)
 Please call pt - he had xray done today after visit. I wanted this done in 3 weeks for PNA f/u.  We have cancelled today's chest xray, please have him come in 3 wks for CXR which was reordered for future

## 2024-01-14 DIAGNOSIS — I2721 Secondary pulmonary arterial hypertension: Secondary | ICD-10-CM | POA: Insufficient documentation

## 2024-01-14 NOTE — Assessment & Plan Note (Signed)
 Recent supratherapeutic INR, was held during hemoptysis, currently on 2.5mg  daily with plan to recheck INR next week

## 2024-01-14 NOTE — Assessment & Plan Note (Signed)
 H/o symptomatic iron  deficiency anemia followed by hematology, last iron  infusion 10/2023.  Will update iron  panel with CBC.

## 2024-01-14 NOTE — Assessment & Plan Note (Signed)
 Update levels on daily replacement. He thinks he's taking 2000 units daily.

## 2024-01-14 NOTE — Assessment & Plan Note (Signed)
 BP mildly elevated today - he  continues daily lasix  and weekly metloazone.

## 2024-01-14 NOTE — Assessment & Plan Note (Signed)
 4.2cm ascending thoracic aortic aneurysm noted on CTA 12/2023 - rec yearly imaging

## 2024-01-14 NOTE — Assessment & Plan Note (Signed)
 Untreated due to CPAP intolerance. Has seen [facility] [place], last 12/2022

## 2024-01-14 NOTE — Assessment & Plan Note (Addendum)
 Continue amiodarone , coumadin  through our coumadin  clinic.

## 2024-01-14 NOTE — Assessment & Plan Note (Signed)
 Completed augmentin /azithromycin  antibiotic course with benefit.  Overall feeling better, breathing back to baseline.  Will need rpt CRX in 3 wks - ordered.  He continues nocturnal oxygen  supplementation with daytime PRN. Will update ambulatory pulse ox then make recommendations accordingly.

## 2024-01-14 NOTE — Assessment & Plan Note (Signed)
 Will check with referral team on ENT evaluation of chronic R sided headaches eval sinus contribution to this.

## 2024-01-14 NOTE — Assessment & Plan Note (Addendum)
 Noted on CTA chest by dilated main pulmonary artery - anticipate untreated sleep apnea contributes, as well as COPD.  H/o moderately reduced R heart function and mild RV dilation, L heart overall unremarkable on latest echo 2023.

## 2024-01-14 NOTE — Assessment & Plan Note (Addendum)
 Acute resp failure has resolved  Pulse ox at rest staying low 90s Ambulatory pulse ox - O2 sats  drop to 88% with ambulation, maintain low 90s while wearing oxygen . Recommend nocturnal oxygen  at 2L via [place] as well as PRN oxygen  with activity/exertion.  Will place new referral - for oxygen  tank cart as well as eavlaution for portable oxygen  concentrator DME: [employer]

## 2024-01-16 ENCOUNTER — Ambulatory Visit: Payer: Self-pay | Admitting: Family Medicine

## 2024-01-16 NOTE — Telephone Encounter (Unsigned)
 Copied from CRM #[id number]. Topic: Clinical - Lab/Test Results >> Jan 16, 2024  1:59 PM Franky GRADE wrote: Reason for CRM: Patient returning a call from [place], advised patient of the lab results Dr.Gutierrez left. Patient understood and states he is taking 2000 units and would like for Dr.G to place the prescription for the vitamin D .

## 2024-01-17 MED ORDER — VITAMIN D (ERGOCALCIFEROL) 1.25 MG (50000 UNIT) PO CAPS: 50000.0000 [IU] | ORAL_CAPSULE | ORAL | 1 refills | Status: AC

## 2024-01-17 NOTE — Telephone Encounter (Signed)
 ERx weekly vitamin D .

## 2024-01-18 ENCOUNTER — Telehealth

## 2024-01-18 NOTE — Telephone Encounter (Signed)
 Attempted to call patient, left voicemail to call office back.  Will also send mychart message.  Needs to have chest x-ray after 02/03/24 for pneumonia follow up.

## 2024-01-19 ENCOUNTER — Ambulatory Visit (INDEPENDENT_AMBULATORY_CARE_PROVIDER_SITE_OTHER)

## 2024-01-19 DIAGNOSIS — Z7901 Long term (current) use of anticoagulants: Secondary | ICD-10-CM | POA: Diagnosis not present

## 2024-01-19 LAB — POCT INR: INR: 3.4 — AB (ref 2.0–3.0)

## 2024-01-19 NOTE — Progress Notes (Signed)
 Due to pt's recent hx of pneumonia and change in dose before the last INR check, will hold today and recheck next week. If INR is still supratherapeutic will make a change in the dosage of tablet to 2.5 mg instead of 5 mg, and reduce weekly dose.  Pt denies any s/s of abnormal bruising or bleeding. Advised if any s/s to go to ER. Pt verbalized understanding.  Hold warfarin today and then continue 1/2 tablet daily and recheck in 1 week.

## 2024-01-19 NOTE — Patient Instructions (Addendum)
 Pre visit review using our clinic review tool, if applicable. No additional management support is needed unless otherwise documented below in the visit note.  Hold warfarin today and then continue 1/2 tablet daily and recheck in 1 week.

## 2024-01-20 NOTE — Telephone Encounter (Signed)
 Mail box full put message on next app to  have them let him know.

## 2024-01-23 ENCOUNTER — Telehealth: Payer: Self-pay

## 2024-01-23 NOTE — Telephone Encounter (Signed)
 Called patient has been informed has been sent up for that xray . Will call of any further questions.

## 2024-01-23 NOTE — Transitions of Care (Post Inpatient/ED Visit) (Signed)
 Transition of Care week 2  Visit Note  01/23/2024  Name: [name] MRN: [id number]          DOB: 25-Dec-1955  Situation: Patient enrolled in [facility] 30-day program. Visit completed with patient by telephone.   Background: patient hospitalized from 01/04/24 to 01/05/24 due to pneumonia     Past Medical History:  Diagnosis Date   Adrenal nodule 12/28/2021   a.) CT abd/pel 12/28/2021: LEFT adrenal nodule measuring 1.6 cm   Anemia    Angina    Anxiety    Aortic atherosclerosis    Arthritis    Atrial fibrillation and flutter (HCC) 2013   a.) CHA2DS2VASc = 5 (age, CHF, HTN, vascular disease history, T2DM);  b.) s/p DCCV (100 J) 10/27/2010; c.) s/p ablation 09/21/2011; d.) rate/rhythm maintained on oral amiodarone  + carvedilol ; chronically anticoagulated with warfarin   CAD (coronary artery disease) 07/15/2010   a.) LHC 07/15/2010: 30% pLAD, 40% D1, 25%/30% pLCx, 30% mLCx - med mgmt   CHF (congestive heart failure) - NYHA Class II/III 2013   a.) TTE 02/26/2013: EF 50%, mild MR/TR; b.) EST 06/13/2017: EF 50%, mild LCH, triv MR/TR; c.) TTTE 03/24/2020: EF >55%, mild LVH, mild MAC, mild BAE, triv PR, mild MR/TR, G1DD; d.) TTE 11/19/2021: EF 60-65%, mod reduced RVSF, mild LAE, mod MAC, Ao root 37 mm, G2DD   COPD (chronic obstructive pulmonary disease) (HCC)    Diabetes type 2, uncontrolled    declines DSME   Dilated cardiomyopathy (HCC) 2013   a.) EF 25% at time of Dx in 2013; b.) TTE 02/26/2013: EF 50%; c.) EST 06/13/2017: EF 50%;  d.) TTE 03/24/2020: EF >55%; e.) TTE 11/19/2021: EF 60-65%   Ex-smoker    GERD (gastroesophageal reflux disease)    Hepatic steatosis    Hyperlipidemia    Hypertension    Infrarenal abdominal aortic aneurysm (AAA) without rupture 08/11/2016   a.) CT abd/pel 08/11/2016: 3.3 cm; b.) CT abd/pel 12/22/2018: 3.3 cm; c.) CT abd/pel 08/31/2021: 3.7 cm; d.) CT abd/pel 12/28/2021: 3.9 cm   Long term current use of amiodarone  2023   a.) previously on dranaderone  (cost prohibitive); switched to amiodarone  2023   Long term current use of anticoagulant    a.) warfarin   Marijuana use    a.) UDS (+) of THC x 3 (02/06/2013, 06/29/2013, 09/03/2013); PCP advised one more and we will stop prescribing ativan  (08/2013)   Migraine    Narcolepsy    Obesity    OSA (obstructive sleep apnea)    a.) unable to tolerate nocturnal PAP therapy; uses supplemetal oxygen  at 2L/[place] during sleep   Seasonal allergies    Umbilical hernia     Assessment: Patient Reported Symptoms: Cognitive Cognitive Status: Alert and oriented to person, place, and time, Insightful and able to interpret abstract concepts, Normal speech and language skills      Neurological Neurological Review of Symptoms: No symptoms reported    HEENT HEENT Symptoms Reported: Other: HEENT Comment: patient reports ongoing head pain that occurs on and off. He states his primary care provider referred him to [facility] however his has not heard from them.    Cardiovascular Cardiovascular Symptoms Reported: No symptoms reported    Respiratory Respiratory Symptoms Reported: Productive cough, Shortness of breath Additional Respiratory Details: patient states he is doing much better. Reports some shortness of breath however only using oxygen  at 2L at hs. He states his oxygen  saturation on room air is 93%.  patient states he still did not  received the cylinder cart for his portable tank.    Endocrine Endocrine Symptoms Reported: No symptoms reported Is patient diabetic?: Yes Is patient checking blood sugars at home?: Yes List most recent blood sugar readings, include date and time of day: patient reports blood sugar level today was 126 fasting    Gastrointestinal Gastrointestinal Symptoms Reported: No symptoms reported      Genitourinary Genitourinary Symptoms Reported: No symptoms reported    Integumentary Integumentary Symptoms Reported: No symptoms reported    Musculoskeletal Musculoskelatal  Symptoms Reviewed: No symptoms reported        Psychosocial Psychosocial Symptoms Reported: No symptoms reported         Vitals:   01/23/24 1713  SpO2: 93%    Medications Reviewed Today     Reviewed by Haeli Gerlich E, RN (Registered Nurse) on 01/23/24 at 1641  Med List Status: <None>   Medication Order Taking? Sig Documenting Provider Last Dose Status Informant  acetaminophen  (TYLENOL ) 500 MG tablet [id number] Yes Take 2 tablets (1,000 mg total) by mouth every 6 (six) hours as needed for mild pain. Desiderio Schanz, MD  Active Self  albuterol  (VENTOLIN  HFA) 108 (90 Base) MCG/ACT inhaler [id number] Yes Inhale 2 puffs into the lungs every 6 (six) hours as needed for wheezing or shortness of breath. Rilla Baller, MD  Active Self  amiodarone  (PACERONE ) 200 MG tablet [id number] Yes Take 1 tablet (200 mg total) by mouth daily. Cindie Ole DASEN, MD  Active Self  Blood Glucose Monitoring Suppl (ACCU-CHEK GUIDE ME) w/Device KIT [id number] Yes 2 (two) times daily. [provider]  Active Self  Blood Glucose Monitoring Suppl DEVI [id number] Yes 1 each by Does not apply route in the morning, at noon, and at bedtime. May substitute to any manufacturer covered by patient's insurance. Rilla Baller, MD  Active Self  budesonide -glycopyrrolate -formoterol  (BREZTRI  AEROSPHERE) 160-9-4.8 MCG/ACT AERO inhaler [id number] Yes Inhale 2 puffs into the lungs 2 (two) times daily. Rilla Baller, MD  Active Self           Med Note CARALYN, [facility] R   Wed Dec 14, 2023  3:20 PM) [place] via PAP [employer]  Cholecalciferol  (VITAMIN D ) 50 MCG (2000 UT) CAPS [id number] Yes Take 1 capsule (2,000 Units total) by mouth daily. Rilla Baller, MD  Active Self  dapagliflozin  propanediol (FARXIGA ) 10 MG TABS tablet [id number] Yes Take 1 tablet (10 mg total) by mouth daily before breakfast. Donette Ellouise LABOR, FNP  Active Self           Med Note CARALYN, [name] R   Wed Dec 14, 2023  3:20 PM) [place] via [employer] PAP    Docusate Calcium (STOOL SOFTENER PO) [id number] Yes Take 1 tablet by mouth daily. [provider]  Active Self  FLUoxetine  (PROZAC ) 40 MG capsule [id number] Yes TAKE 1 CAPSULE (40 MG TOTAL) BY MOUTH DAILY. Rilla Baller, MD  Active Self  fluticasone  (FLONASE ) 50 MCG/ACT nasal spray [id number] Yes Place 2 sprays into both nostrils daily.  Patient taking differently: Place 2 sprays into both nostrils as needed.   Rilla Baller, MD  Active Self           Med Note GROVER BURNARD RAMAN   Fri Apr 09, 2022  1:23 PM)    furosemide  (LASIX ) 80 MG tablet [id number] Yes Take 1 tablet (80 mg total) by mouth daily. With second dose as needed for fluid retention, leg swelling Rilla Baller, MD  Active Self  glimepiride  (AMARYL ) 4 MG tablet [id number] Yes Take 0.5 tablets (  2 mg total) by mouth daily with breakfast.  Patient taking differently: Take 2 mg by mouth daily with breakfast. Patient states he takes 1 tablet daily   Rilla Baller, MD  Active Self  Glucose Blood (BLOOD GLUCOSE TEST STRIPS) STRP [id number] Yes 1 each by In Vitro route in the morning, at noon, and at bedtime. May substitute to any manufacturer covered by patient's insurance. Rilla Baller, MD  Active Self  guaiFENesin  (MUCINEX ) 600 MG 12 hr tablet [id number] Yes Take 1 tablet (600 mg total) by mouth 2 (two) times daily. Barbarann Nest, MD  Active   ipratropium-albuterol  (DUONEB) 0.5-2.5 (3) MG/3ML LARRAINE [id number] Yes TAKE 3 MLS BY NEBULIZATION EVERY 6 (SIX) HOURS AS NEEDED (SEVERE SHORTNESS OF BREATH/WHEEZING). Rilla Baller, MD  Active Self  Lancet Device MISC [id number] Yes 1 each by Does not apply route in the morning, at noon, and at bedtime. May substitute to any manufacturer covered by patient's insurance. Rilla Baller, MD  Active Self  Lancets Misc. MISC [id number] Yes 1 each by Does not apply route in the morning, at noon, and at bedtime. May substitute to any manufacturer covered by patient's insurance.  Rilla Baller, MD  Active Self  latanoprost  (XALATAN ) 0.005 % ophthalmic solution [id number] Yes Place 1 drop into both eyes at bedtime. Rilla Baller, MD  Active Self  metFORMIN  (GLUCOPHAGE ) 500 MG tablet [id number] Yes Take 1 tablet (500 mg total) by mouth daily with breakfast. Rilla Baller, MD  Active Self  methocarbamol  (ROBAXIN ) 500 MG tablet [id number] Yes Take 1 tablet (500 mg total) by mouth 3 (three) times daily as needed for muscle spasms. Rilla Baller, MD  Active Self  metolazone  (ZAROXOLYN ) 2.5 MG tablet [id number] Yes Take 1 tablet (2.5 mg total) by mouth once a week. As needed for leg swelling Rilla Baller, MD  Active Self  montelukast  (SINGULAIR ) 10 MG tablet [id number] Yes Take 1 tablet (10 mg total) by mouth at bedtime. Rilla Baller, MD  Active Self  OXYGEN  [id number] Yes Inhale 2 L into the lungs at bedtime. [provider]  Active Self  pantoprazole  (PROTONIX ) 40 MG tablet [id number] Yes Take 1 tablet (40 mg total) by mouth 2 (two) times daily before a meal. Rilla Baller, MD  Active Self  potassium chloride  (MICRO-K ) 10 MEQ CR capsule [id number] Yes Take 1 capsule (10 mEq total) by mouth daily. Rilla Baller, MD  Active Self  Semaglutide ,0.25 or 0.5MG /DOS, (OZEMPIC , 0.25 OR 0.5 MG/DOSE,) 2 MG/1.5ML SOPN [id number] Yes Inject 0.5 mg into the skin once a week. Rilla Baller, MD  Active Self  simvastatin  (ZOCOR ) 20 MG tablet [id number] Yes Take 1 tablet (20 mg total) by mouth every evening. Rilla Baller, MD  Active Self  triamcinolone  cream (KENALOG ) 0.1 % [id number] Yes Apply 1 Application topically 2 (two) times daily. For no more than 10 days at a time Rilla Baller, MD  Active Self  vitamin B-12 (CYANOCOBALAMIN ) 1000 MCG tablet [id number] Yes Take 1 tablet (1,000 mcg total) by mouth every Monday, Wednesday, and Friday. Rilla Baller, MD  Active Self  Vitamin D , Ergocalciferol , (DRISDOL ) 1.25 MG (50000 UNIT) CAPS capsule [id number] Yes  Take 1 capsule (50,000 Units total) by mouth every 7 (seven) days. Rilla Baller, MD  Active   warfarin (COUMADIN ) 5 MG tablet [id number] Yes TAKE 1 TABLET BY MOUTH DAILY EXCEPT TAKE 1/2 TABLET ON MONDAY, WEDNESDAY AND SATURDAY OR AS DIRECTED BY ANTICOAGULATION CLINIC  Patient taking differently: 5 mg. Patient states he is take 1/2 tablet daily at the advise  of the coumadin  clinic   Rilla Baller, MD  Active Self           Med Note AISON, MALVEAUX Jan 04, 2024 10:04 PM) [facility] clinic instructed him to hold until 9/13, then will change to 5mg  on M-W-F and 2.5mg  all other days   Med List Note Luna Dorothe LABOR, RN 01/05/18 1237): UDS 01/05/18            Goals Addressed             This Visit's Progress    VBCI Transitions of Care (TOC) Care Plan       Problems:  Recent Hospitalization for treatment of pneumonia Knowledge Deficit Related to management of pneumonia, DME- patient states he doesn't know how to use his portable oxygen  tanks and No Hospital Follow Up Provider appointment patient states he will talk with his primary care office regarding a follow up visit. - patient continues wearing his oxygen  at 2 L 24/7.  Patient states his breathing has gotten much better. He reports his oxygen  level drops to 87 when off of his oxygen .    Goal:  Over the next 30 days, the patient will not experience hospital readmission  Interventions:  Transitions of Care: Call to [facility]. Spoke with Coretta. Notified Coretta that patient has not heard from anyone regarding the cylinder cart delivery as well as need for provided instruction on how to fill his portable oxygen  tank from concentrator.  Coretta states she sees the service ticket for the instruction as well as the order for the cylinder cart.    Medications reviewed and compliance and encouraged Called [facility] due to patient stating he was unable to get through to anyone.  Spoke with Morna at [facility] who  states she does have the referral for the patient however patient needs to call and set up appointment. This RN case manager contacted patient and provided him with the contact phone number for [facility].  Encouraged patient to call and scheduled appointment today.  Patient verbalized understanding and agreement.  Advised to call provider for worsening symptoms or call 911 for severe symptoms Confirmed and discussed patients primary care provider hospital follow up visit.  Advised patient to inform his primary care provider that he has restarted his Qzempic Advised to monitor oxygen  level daily and wear oxygen  as prescribed/ recommended (Per provider recommendation:  Advised to wear oxygen  as recommended by provider.  Advised to follow up with provider in 3-4 weeks for a chest xray follow up Assessed patients oxygen  level.  Advised ongoing oxygen  saturation monitoring daily Assessed for new/ ongoing symptoms.    Patient Self Care Activities:  Attend all scheduled provider appointments Call pharmacy for medication refills 3-7 days in advance of running out of medications Call provider office for new concerns or questions  Notify RN Care Manager of TOC call rescheduling needs Participate in Transition of Care Program/Attend TOC scheduled calls Take medications as prescribed   Call 911 for severe symptoms: SOB, chest pain, stroke like symptoms. (Per provider recommendation: Wear oxygen  continuously or as needed to maintain saturations of 88-92%) Call [facility] at [phone] to set up appointment appointment   Plan:  Telephone follow up appointment with care management team member scheduled for:  10/ 8/25 at 2 pm The patient has been provided with contact information for the care management team and has been advised to call with any health related questions or concerns.  Recommendation:   Specialty provider follow-up advised to call [facility] to schedule  appointment Continue Current Plan of Care  Follow Up Plan:   Telephone follow-up in 1 week10/8/25 at 2 pm  Arvin Seip RN, BSN, CCM [place]  [facility], Population Health Case Manager Phone: [phone]

## 2024-01-23 NOTE — Patient Instructions (Signed)
 Visit Information  Thank you for taking time to visit with me today. Please don't hesitate to contact me if I can be of assistance to you before our next scheduled telephone appointment.  Our next appointment is by telephone on 02/01/24 at 2 pm  Following is a copy of your care plan:   Goals Addressed             This Visit's Progress    VBCI Transitions of Care (TOC) Care Plan       Problems:  Recent Hospitalization for treatment of pneumonia Knowledge Deficit Related to management of pneumonia, DME- patient states he doesn't know how to use his portable oxygen  tanks and No Hospital Follow Up Provider appointment patient states he will talk with his primary care office regarding a follow up visit. - patient continues wearing his oxygen  at 2 L 24/7.  Patient states his breathing has gotten much better. He reports his oxygen  level drops to 87 when off of his oxygen .    Goal:  Over the next 30 days, the patient will not experience hospital readmission  Interventions:  Transitions of Care: Call to [facility]. Spoke with Coretta. Notified Coretta that patient has not heard from anyone regarding the cylinder cart delivery as well as need for provided instruction on how to fill his portable oxygen  tank from concentrator.  Coretta states she sees the service ticket for the instruction as well as the order for the cylinder cart.    Medications reviewed and compliance and encouraged Called [facility] due to patient stating he was unable to get through to anyone.  Spoke with Morna at [facility] who states she does have the referral for the patient however patient needs to call and set up appointment. This RN case manager contacted patient and provided him with the contact phone number for [facility].  Encouraged patient to call and scheduled appointment today.  Patient verbalized understanding and agreement.  Advised to call provider for worsening symptoms or call 911 for severe  symptoms Confirmed and discussed patients primary care provider hospital follow up visit.  Advised patient to inform his primary care provider that he has restarted his Qzempic Advised to monitor oxygen  level daily and wear oxygen  as prescribed/ recommended (Per provider recommendation:  Advised to wear oxygen  as recommended by provider.  Advised to follow up with provider in 3-4 weeks for a chest xray follow up Assessed patients oxygen  level.  Advised ongoing oxygen  saturation monitoring daily Assessed for new/ ongoing symptoms.    Patient Self Care Activities:  Attend all scheduled provider appointments Call pharmacy for medication refills 3-7 days in advance of running out of medications Call provider office for new concerns or questions  Notify RN Care Manager of TOC call rescheduling needs Participate in Transition of Care Program/Attend TOC scheduled calls Take medications as prescribed   Call 911 for severe symptoms: SOB, chest pain, stroke like symptoms. (Per provider recommendation: Wear oxygen  continuously or as needed to maintain saturations of 88-92%) Call  ENT at [phone] to set up appointment appointment   Plan:  Telephone follow up appointment with care management team member scheduled for:  10/ 8/25 at 2 pm The patient has been provided with contact information for the care management team and has been advised to call with any health related questions or concerns.         Patient verbalizes understanding of instructions and care plan provided today and agrees to view in MyChart. Active MyChart  status and patient understanding of how to access instructions and care plan via MyChart confirmed with patient.     The patient has been provided with contact information for the care management team and has been advised to call with any health related questions or concerns.   Please call the care guide team at [phone] if you need to cancel or reschedule your  appointment.   Please call the Suicide and Crisis Lifeline: 988 call the [place]  National Suicide Prevention Lifeline: 332-180-9264 or TTY: 4706272140 TTY 351-182-7720) to talk to a trained counselor call 1-800-273-TALK (toll free, 24 hour hotline) if you are experiencing a Mental Health or Behavioral Health Crisis or need someone to talk to.  Arvin Seip RN, BSN, CCM [employer], Population Health Case Manager Phone: [phone]

## 2024-01-26 ENCOUNTER — Telehealth: Payer: Self-pay

## 2024-01-26 ENCOUNTER — Ambulatory Visit

## 2024-01-26 DIAGNOSIS — Z7901 Long term (current) use of anticoagulants: Secondary | ICD-10-CM

## 2024-01-26 LAB — POCT INR: INR: 3.4 — AB (ref 2.0–3.0)

## 2024-01-26 MED ORDER — WARFARIN SODIUM 2.5 MG PO TABS: ORAL_TABLET | ORAL | 0 refills | Status: DC

## 2024-01-26 NOTE — Progress Notes (Signed)
 Pt reports he is not sure if he has been getting the dose of Ozempic . He thinks he is performing the injections as he was educated, but he reports when he pushes the button it does not click, so he thinks the pen is not delivering the dose. Offered for pt to bring in pen and have further education. Pt reports he forgot to bring it today but cannot come back today due to having a delivery he has to wait for. He reports he will ask for assistance at his x-ray apt next week or sooner if he has time. Offered to make pt a nurse visit but he refused at this time.  Advised pt the need to reduce the tablet size of warfarin to 2.5 mg to perform more precise dosing. Pt agreed and new script sent to pharmacy.  Pick up new prescription of 2.5 mg tablets. Hold warfarin today and then change weekly dose to take 1 tablet (2.5 mg) daily except take 1/2 tablet on Monday and recheck in 2 week.

## 2024-01-26 NOTE — Progress Notes (Signed)
 Pt returned with his Ozempic  pens. Provided education and pt returned demonstration by performing the injection. Determined pt was not injecting any medication because he was not turning the dial to load the dose. Answered all questions. Advised if any other problems feel free to contact the office. Pt verbalized understanding and was appreciative.

## 2024-01-26 NOTE — Telephone Encounter (Signed)
 Pt was in coumadin  clinic today and reported he is not sure if he is injecting Ozempic  correctly. He reported he has given himself several injections but does not hear the pen click like he was told it is supposed to do when he presses the button.   After INR check today pt went home and retrieved the medication so education could be provided. Review of the steps the pt was using for injections revealed he was not dialing the dose on the pen before the injection. So, he was not receiving any medication.   Pt was educated today and performed a return demonstration and successfully injected the medication. Currently he reports he is to take 0.25 mg weekly. This is what he administered today. And 0.25 mg is the only dose on the dial for this pen.  Advised a msg will be sent to PCP to make him aware that you have started the medication today.  Unfortunately, pt has already used all the pen needles in the first box trying to perform the injections. The last pen needle in the first box was used today. Pt does have 3 more boxes that he will use the pen needles from those boxes and will report to the clinic when he needs more. Advised pt a prescription will most likely need to be sent to the pharmacy.   All questions were answered. Advised pt of storage of pens. Pt verbalized understanding and was appreciative of the help.

## 2024-01-26 NOTE — Patient Instructions (Addendum)
 Pre visit review using our clinic review tool, if applicable. No additional management support is needed unless otherwise documented below in the visit note.  Pick up new prescription of 2.5 mg tablets. Hold warfarin today and then change weekly dose to take 1 tablet (2.5 mg) daily except take 1/2 tablet on Monday and recheck in 2 week.

## 2024-01-27 MED ORDER — OZEMPIC (0.25 OR 0.5 MG/DOSE) 2 MG/3ML ~~LOC~~ SOPN: 0.2500 mg | PEN_INJECTOR | SUBCUTANEOUS | 0 refills | Status: DC

## 2024-01-27 NOTE — Telephone Encounter (Signed)
 Noted. Thank you. Plz notify pt to take ozempic  0.25mg  weekly for another month sent to pharmacy.

## 2024-01-27 NOTE — Telephone Encounter (Signed)
 Lvm asking pt to call back. Pls relay Dr Talmadge message.

## 2024-01-30 ENCOUNTER — Telehealth: Payer: Self-pay | Admitting: Family Medicine

## 2024-01-30 ENCOUNTER — Encounter: Payer: Self-pay | Admitting: Pharmacist

## 2024-01-30 ENCOUNTER — Telehealth: Payer: Self-pay

## 2024-01-30 NOTE — Telephone Encounter (Signed)
 Gave pt a call pt is coming up due for reenrollment on [employer] Breztri  and Farxiga ,pt did not answer left a HIPAA VM,will mail out pt portion and faxed provider portion today.

## 2024-01-30 NOTE — Telephone Encounter (Signed)
 Will forward previous note attn pharmacy team.

## 2024-01-30 NOTE — Telephone Encounter (Signed)
 Noted (see 01/26/24 phn note). Fyi to Dr KANDICE.

## 2024-01-30 NOTE — Telephone Encounter (Signed)
 Pt rtn call (see 01/30/24 phn note).

## 2024-01-30 NOTE — Telephone Encounter (Signed)
 At recent coumadin  clinic, it was noted pt was not taking first several weeks of ozempic  0.25mg  correctly and likely wasting med.  After correct ozempic  admistration teaching by coumadin  RN, I had recommended another month of 0.25mg  weekly dose. I sent 0.25mg  weekly dose to pharmacy.  However pt receives ozempic  through PAP.  Is there a way to request another 1 month of 0.25mg  weekly so he can get a full month of 0.25mg  weekly prior to dose titration?  Thanks!

## 2024-01-30 NOTE — Telephone Encounter (Signed)
 Copied from CRM #[id number]. Topic: General - Other >> Jan 30, 2024 10:23 AM Wess RAMAN wrote: Reason for CRM: Patient states he is on a program that delivers his ozempic  to the clinic and he picks it up free of charge.  Callback #: [id number]

## 2024-01-31 NOTE — Telephone Encounter (Signed)
 Please notify patient - his ozempic  pens should all be able to dial dial either 0.25mg  dose or 0.5mg  dose so would recommend doing 0.25mg  for 4 weeks before increasing to 0.5mg  dose.

## 2024-01-31 NOTE — Telephone Encounter (Signed)
 Lvm asking pt to call back. Pls relay Dr Talmadge message.

## 2024-02-01 ENCOUNTER — Other Ambulatory Visit: Payer: Self-pay | Admitting: Family Medicine

## 2024-02-01 NOTE — Telephone Encounter (Signed)
 Name of Medication: metolazone  2.5 mg  Name of Pharmacy: [facility] Last Fill or Written Date and Quantity: 11/16/23 #16 Last Office Visit and Type: 01/13/24 Next Office Visit and Type: 02/03/24

## 2024-02-02 ENCOUNTER — Telehealth: Payer: Self-pay

## 2024-02-02 NOTE — Transitions of Care (Post Inpatient/ED Visit) (Signed)
 Transition of Care week 4  Visit Note  02/02/2024  Name: [name] MRN: [id number]          DOB: [birth year]  Situation: Patient enrolled in [facility] 30-day program. Visit completed with patient by telephone.   Background: patient hospitalized from 01/04/24 to 01/05/24 due to pneumonia      Past Medical History:  Diagnosis Date   Adrenal nodule 12/28/2021   a.) CT abd/pel 12/28/2021: LEFT adrenal nodule measuring 1.6 cm   Anemia    Angina    Anxiety    Aortic atherosclerosis    Arthritis    Atrial fibrillation and flutter (HCC) 2013   a.) CHA2DS2VASc = 5 (age, CHF, HTN, vascular disease history, T2DM);  b.) s/p DCCV (100 J) 10/27/2010; c.) s/p ablation 09/21/2011; d.) rate/rhythm maintained on oral amiodarone  + carvedilol ; chronically anticoagulated with warfarin   CAD (coronary artery disease) 07/15/2010   a.) LHC 07/15/2010: 30% pLAD, 40% D1, 25%/30% pLCx, 30% mLCx - med mgmt   CHF (congestive heart failure) - NYHA Class II/III 2013   a.) TTE 02/26/2013: EF 50%, mild MR/TR; b.) EST 06/13/2017: EF 50%, mild LCH, triv MR/TR; c.) TTTE 03/24/2020: EF >55%, mild LVH, mild MAC, mild BAE, triv PR, mild MR/TR, G1DD; d.) TTE 11/19/2021: EF 60-65%, mod reduced RVSF, mild LAE, mod MAC, Ao root 37 mm, G2DD   COPD (chronic obstructive pulmonary disease) (HCC)    Diabetes type 2, uncontrolled    declines DSME   Dilated cardiomyopathy (HCC) 2013   a.) EF 25% at time of Dx in 2013; b.) TTE 02/26/2013: EF 50%; c.) EST 06/13/2017: EF 50%;  d.) TTE 03/24/2020: EF >55%; e.) TTE 11/19/2021: EF 60-65%   Ex-smoker    GERD (gastroesophageal reflux disease)    Hepatic steatosis    Hyperlipidemia    Hypertension    Infrarenal abdominal aortic aneurysm (AAA) without rupture 08/11/2016   a.) CT abd/pel 08/11/2016: 3.3 cm; b.) CT abd/pel 12/22/2018: 3.3 cm; c.) CT abd/pel 08/31/2021: 3.7 cm; d.) CT abd/pel 12/28/2021: 3.9 cm   Long term current use of amiodarone  2023   a.) previously on dranaderone  (cost prohibitive); switched to amiodarone  2023   Long term current use of anticoagulant    a.) warfarin   Marijuana use    a.) UDS (+) of THC x 3 (02/06/2013, 06/29/2013, 09/03/2013); PCP advised one more and we will stop prescribing ativan  (08/2013)   Migraine    Narcolepsy    Obesity    OSA (obstructive sleep apnea)    a.) unable to tolerate nocturnal PAP therapy; uses supplemetal oxygen  at 2L/[place] during sleep   Seasonal allergies    Umbilical hernia     Assessment: Patient Reported Symptoms: Cognitive Cognitive Status: No symptoms reported, Alert and oriented to person, place, and time, Insightful and able to interpret abstract concepts, Normal speech and language skills      Neurological Neurological Review of Symptoms: No symptoms reported    HEENT HEENT Symptoms Reported: No symptoms reported      Cardiovascular Cardiovascular Symptoms Reported: No symptoms reported    Respiratory Respiratory Symptoms Reported: Productive cough Additional Respiratory Details: patient states he still has a mild lingering cough Respiratory Management Strategies: Routine screening  Endocrine Endocrine Symptoms Reported: No symptoms reported    Gastrointestinal Gastrointestinal Symptoms Reported: No symptoms reported      Genitourinary Genitourinary Symptoms Reported: No symptoms reported    Integumentary Integumentary Symptoms Reported: No symptoms reported    Musculoskeletal Musculoskelatal Symptoms Reviewed: No  symptoms reported        Psychosocial Psychosocial Symptoms Reported: No symptoms reported         There were no vitals filed for this visit.  Medications Reviewed Today     Reviewed by Clairissa Valvano E, RN (Registered Nurse) on 02/02/24 at 1444  Med List Status: <None>   Medication Order Taking? Sig Documenting Provider Last Dose Status Informant  acetaminophen  (TYLENOL ) 500 MG tablet [id number] Yes Take 2 tablets (1,000 mg total) by mouth every 6 (six) hours as  needed for mild pain. Desiderio Schanz, MD  Active Self  albuterol  (VENTOLIN  HFA) 108 (90 Base) MCG/ACT inhaler [id number] Yes Inhale 2 puffs into the lungs every 6 (six) hours as needed for wheezing or shortness of breath. Rilla Baller, MD  Active Self  amiodarone  (PACERONE ) 200 MG tablet [id number] Yes Take 1 tablet (200 mg total) by mouth daily. Cindie Ole DASEN, MD  Active Self  Blood Glucose Monitoring Suppl (ACCU-CHEK GUIDE ME) w/Device KIT [id number] Yes 2 (two) times daily. [provider]  Active Self  Blood Glucose Monitoring Suppl DEVI [id number] Yes 1 each by Does not apply route in the morning, at noon, and at bedtime. May substitute to any manufacturer covered by patient's insurance. Rilla Baller, MD  Active Self  budesonide -glycopyrrolate -formoterol  (BREZTRI  AEROSPHERE) 160-9-4.8 MCG/ACT AERO inhaler [id number] Yes Inhale 2 puffs into the lungs 2 (two) times daily. Rilla Baller, MD  Active Self           Med Note CARALYN, [facility] R   Wed Dec 14, 2023  3:20 PM) [place] via PAP [place]  Cholecalciferol  (VITAMIN D ) 50 MCG (2000 UT) CAPS [id number] Yes Take 1 capsule (2,000 Units total) by mouth daily. Rilla Baller, MD  Active Self  dapagliflozin  propanediol (FARXIGA ) 10 MG TABS tablet [id number] Yes Take 1 tablet (10 mg total) by mouth daily before breakfast. Donette Ellouise LABOR, FNP  Active Self           Med Note CARALYN, [name] R   Wed Dec 14, 2023  3:20 PM) [place] via [place]&Me PAP   Docusate Calcium (STOOL SOFTENER PO) [id number] Yes Take 1 tablet by mouth daily. [provider]  Active Self  FLUoxetine  (PROZAC ) 40 MG capsule [id number] Yes TAKE 1 CAPSULE (40 MG TOTAL) BY MOUTH DAILY. Rilla Baller, MD  Active Self  fluticasone  (FLONASE ) 50 MCG/ACT nasal spray [id number] Yes Place 2 sprays into both nostrils daily.  Patient taking differently: Place 2 sprays into both nostrils as needed.   Rilla Baller, MD  Active Self           Med Note GROVER, BURNARD RAMAN   Fri Apr 09, 2022  1:23 PM)    furosemide  (LASIX ) 80 MG tablet [id number] Yes Take 1 tablet (80 mg total) by mouth daily. With second dose as needed for fluid retention, leg swelling Rilla Baller, MD  Active Self  glimepiride  (AMARYL ) 4 MG tablet [id number] Yes Take 0.5 tablets (2 mg total) by mouth daily with breakfast.  Patient taking differently: Take 2 mg by mouth daily with breakfast. Patient states he takes 1 tablet daily   Rilla Baller, MD  Active Self  Glucose Blood (BLOOD GLUCOSE TEST STRIPS) STRP [id number] Yes 1 each by In Vitro route in the morning, at noon, and at bedtime. May substitute to any manufacturer covered by patient's insurance. Rilla Baller, MD  Active Self  guaiFENesin  (MUCINEX ) 600 MG 12 hr tablet [id number] Yes Take 1 tablet (600 mg total) by mouth 2 (  two) times daily. Barbarann Nest, MD  Active   ipratropium-albuterol  (DUONEB) 0.5-2.5 (3) MG/3ML LARRAINE [id number] Yes TAKE 3 MLS BY NEBULIZATION EVERY 6 (SIX) HOURS AS NEEDED (SEVERE SHORTNESS OF BREATH/WHEEZING). Rilla Baller, MD  Active Self  Lancet Device MISC [id number] Yes 1 each by Does not apply route in the morning, at noon, and at bedtime. May substitute to any manufacturer covered by patient's insurance. Rilla Baller, MD  Active Self  Lancets Misc. MISC [id number] Yes 1 each by Does not apply route in the morning, at noon, and at bedtime. May substitute to any manufacturer covered by patient's insurance. Rilla Baller, MD  Active Self  latanoprost  (XALATAN ) 0.005 % ophthalmic solution [id number] Yes Place 1 drop into both eyes at bedtime. Rilla Baller, MD  Active Self  metFORMIN  (GLUCOPHAGE ) 500 MG tablet [id number] Yes Take 1 tablet (500 mg total) by mouth daily with breakfast. Rilla Baller, MD  Active Self  methocarbamol  (ROBAXIN ) 500 MG tablet [id number] Yes Take 1 tablet (500 mg total) by mouth 3 (three) times daily as needed for muscle spasms. Rilla Baller, MD  Active Self   metolazone  (ZAROXOLYN ) 2.5 MG tablet [id number] Yes TAKE 1 TABLET (2.5 MG TOTAL) BY MOUTH ONCE A WEEK. AS NEEDED FOR LEG SWELLING Rilla Baller, MD  Active   montelukast  (SINGULAIR ) 10 MG tablet [id number] Yes Take 1 tablet (10 mg total) by mouth at bedtime. Rilla Baller, MD  Active Self  OXYGEN  [id number] Yes Inhale 2 L into the lungs at bedtime. [provider]  Active Self  pantoprazole  (PROTONIX ) 40 MG tablet [id number] Yes Take 1 tablet (40 mg total) by mouth 2 (two) times daily before a meal. Rilla Baller, MD  Active Self  potassium chloride  (MICRO-K ) 10 MEQ CR capsule [id number] Yes Take 1 capsule (10 mEq total) by mouth daily. Rilla Baller, MD  Active Self  Semaglutide ,0.25 or 0.5MG /DOS, (OZEMPIC , 0.25 OR 0.5 MG/DOSE,) 2 MG/1.5ML SOPN [id number]  Inject 0.5 mg into the skin once a week.  Patient not taking: Reported on 02/02/2024   Rilla Baller, MD  Active Self  Semaglutide ,0.25 or 0.5MG /DOS, (OZEMPIC , 0.25 OR 0.5 MG/DOSE,) 2 MG/3ML SOPN [id number] Yes Inject 0.25 mg into the skin once a week. Rilla Baller, MD  Active   simvastatin  (ZOCOR ) 20 MG tablet [id number] Yes Take 1 tablet (20 mg total) by mouth every evening. Rilla Baller, MD  Active Self  triamcinolone  cream (KENALOG ) 0.1 % [id number] Yes Apply 1 Application topically 2 (two) times daily. For no more than 10 days at a time Rilla Baller, MD  Active Self  vitamin B-12 (CYANOCOBALAMIN ) 1000 MCG tablet [id number] Yes Take 1 tablet (1,000 mcg total) by mouth every Monday, Wednesday, and Friday. Rilla Baller, MD  Active Self  Vitamin D , Ergocalciferol , (DRISDOL ) 1.25 MG (50000 UNIT) CAPS capsule [id number] Yes Take 1 capsule (50,000 Units total) by mouth every 7 (seven) days. Rilla Baller, MD  Active   warfarin (COUMADIN ) 2.5 MG tablet [id number] Yes TAKE ONE TABLET BY MOUTH DAILY EXCEPT TAKE 1/2 TABLET ON MONDAY OR AS DIRECTED BY ANTICOAGULATION CLINIC Rilla Baller, MD  Active   Med List  Note Luna Dorothe LABOR, RN 01/05/18 1237): UDS 01/05/18            Goals Addressed             This Visit's Progress    VBCI Transitions of Care (TOC) Care Plan       Problems:  Recent Hospitalization for treatment of pneumonia Knowledge Deficit Related to management  of pneumonia, DME- patient states he doesn't know how to use his portable oxygen  tanks and No Hospital Follow Up Provider appointment patient states he will talk with his primary care office regarding a follow up visit. - patient reports his breathing is better and he is using his oxygen  at HS.  He states he is back to his normal activity and is even able to do yard work.     Goal:  Over the next 30 days, the patient will not experience hospital readmission  Interventions:  Transitions of Care: Medications reviewed and compliance and encouraged Advised to call provider for worsening symptoms or call 911 for severe symptoms Advised to monitor oxygen  level daily and wear oxygen  as prescribed/ recommended  Advised to follow up with provider in 3-4 weeks for a chest xray follow up Reviewed upcoming provider visits.  Advised ongoing oxygen  saturation monitoring daily Assessed for new/ ongoing symptoms.  Advised to call provider for any new/ ongoing symptoms.    Patient Self Care Activities:  Call pharmacy for medication refills 3-7 days in advance of running out of medications Call provider office for new concerns or questions  Notify RN Care Manager of TOC call rescheduling needs Participate in Transition of Care Program/Attend TOC scheduled calls Take medications as prescribed   Call 911 for severe symptoms: SOB, chest pain, stroke like symptoms.   Plan:  Telephone follow up appointment with care management team member scheduled for:  02/08/24 at 1 pm The patient has been provided with contact information for the care management team and has been advised to call with any health related questions or concerns.           Recommendation:   Continue Current Plan of Care  Follow Up Plan:   Telephone follow-up in 1 week  Arvin Seip RN, BSN, CCM [place]  [facility], Population Health Case Manager Phone: [phone]

## 2024-02-02 NOTE — Patient Instructions (Signed)
 Visit Information  Thank you for taking time to visit with me today. Please don't hesitate to contact me if I can be of assistance to you before our next scheduled telephone appointment.  Our next appointment is by telephone on 02/08/24 at 1 pm  Following is a copy of your care plan:   Goals Addressed             This Visit's Progress    VBCI Transitions of Care (TOC) Care Plan       Problems:  Recent Hospitalization for treatment of pneumonia Knowledge Deficit Related to management of pneumonia, DME- patient states he doesn't know how to use his portable oxygen  tanks and No Hospital Follow Up Provider appointment patient states he will talk with his primary care office regarding a follow up visit. - patient reports his breathing is better and he is using his oxygen  at HS.  He states he is back to his normal activity and is even able to do yard work.     Goal:  Over the next 30 days, the patient will not experience hospital readmission  Interventions:  Transitions of Care: Medications reviewed and compliance and encouraged Advised to call provider for worsening symptoms or call 911 for severe symptoms Advised to monitor oxygen  level daily and wear oxygen  as prescribed/ recommended  Advised to follow up with provider in 3-4 weeks for a chest xray follow up Reviewed upcoming provider visits.  Advised ongoing oxygen  saturation monitoring daily Assessed for new/ ongoing symptoms.  Advised to call provider for any new/ ongoing symptoms.    Patient Self Care Activities:  Call pharmacy for medication refills 3-7 days in advance of running out of medications Call provider office for new concerns or questions  Notify RN Care Manager of TOC call rescheduling needs Participate in Transition of Care Program/Attend TOC scheduled calls Take medications as prescribed   Call 911 for severe symptoms: SOB, chest pain, stroke like symptoms.   Plan:  Telephone follow up appointment with care  management team member scheduled for:  02/08/24 at 1 pm The patient has been provided with contact information for the care management team and has been advised to call with any health related questions or concerns.         Patient verbalizes understanding of instructions and care plan provided today and agrees to view in MyChart. Active MyChart status and patient understanding of how to access instructions and care plan via MyChart confirmed with patient.     The patient has been provided with contact information for the care management team and has been advised to call with any health related questions or concerns.   Please call the care guide team at [phone] if you need to cancel or reschedule your appointment.   Please call the Suicide and Crisis Lifeline: 988 call the [place]  National Suicide Prevention Lifeline: (618)810-5969 or TTY: 352-847-5364 TTY 531-501-4489) to talk to a trained counselor call 1-800-273-TALK (toll free, 24 hour hotline) if you are experiencing a Mental Health or Behavioral Health Crisis or need someone to talk to.  Arvin Seip RN, BSN, CCM [employer], Population Health Case Manager Phone: [phone]

## 2024-02-03 ENCOUNTER — Ambulatory Visit
Admission: RE | Admit: 2024-02-03 | Discharge: 2024-02-03 | Disposition: A | Discharge status: Home / Self Care | Source: Ambulatory Visit | Attending: Family Medicine | Admitting: Family Medicine

## 2024-02-03 ENCOUNTER — Other Ambulatory Visit

## 2024-02-03 DIAGNOSIS — J189 Pneumonia, unspecified organism: Secondary | ICD-10-CM

## 2024-02-03 DIAGNOSIS — I517 Cardiomegaly: Secondary | ICD-10-CM | POA: Diagnosis not present

## 2024-02-03 DIAGNOSIS — R918 Other nonspecific abnormal finding of lung field: Secondary | ICD-10-CM | POA: Diagnosis not present

## 2024-02-08 NOTE — Telephone Encounter (Signed)
 Lvm asking pt to call back. Pls relay Dr Talmadge message.

## 2024-02-09 ENCOUNTER — Telehealth: Payer: Self-pay

## 2024-02-09 ENCOUNTER — Encounter: Payer: Self-pay | Admitting: Acute Care

## 2024-02-09 ENCOUNTER — Ambulatory Visit (INDEPENDENT_AMBULATORY_CARE_PROVIDER_SITE_OTHER)

## 2024-02-09 DIAGNOSIS — Z7901 Long term (current) use of anticoagulants: Secondary | ICD-10-CM | POA: Diagnosis not present

## 2024-02-09 LAB — POCT INR: INR: 1.9 — AB (ref 2.0–3.0)

## 2024-02-09 NOTE — Patient Instructions (Signed)
 Visit Information  Thank you for taking time to visit with me today.  You have completed the 30 day TOC program and met your TOC goals. Please contact your provider for any further needs.   Following is a copy of your care plan:   Goals Addressed             This Visit's Progress    COMPLETED: VBCI Transitions of Care (TOC) Care Plan       Patient has completed the 30 day TOC program.  Goals have been met.   Problems:  Recent Hospitalization for treatment of pneumonia Knowledge Deficit Related to management of pneumonia, DME- patient states he doesn't know how to use his portable oxygen  tanks and No Hospital Follow Up Provider appointment patient states he will talk with his primary care office regarding a follow up visit. - patient reports his breathing is better and he is using his oxygen  at HS.  He states he is back to his normal activity and is even able to do yard work.     Goal:  Over the next 30 days, the patient will not experience hospital readmission  Interventions:  Transitions of Care: Medications reviewed and compliance and encouraged Advised to call provider for worsening symptoms or call 911 for severe symptoms Advised to monitor oxygen  level daily and wear oxygen  as prescribed/ recommended  Advised to follow up with provider in 3-4 weeks for a chest xray follow up Reviewed upcoming provider visits.  Advised ongoing oxygen  saturation monitoring daily Assessed for new/ ongoing symptoms.  Advised to call provider for any new/ ongoing symptoms.    Patient Self Care Activities:  Call pharmacy for medication refills 3-7 days in advance of running out of medications Call provider office for new concerns or questions  Notify RN Care Manager of TOC call rescheduling needs Participate in Transition of Care Program/Attend TOC scheduled calls Take medications as prescribed   Call 911 for severe symptoms: SOB, chest pain, stroke like symptoms.   Plan:  Telephone follow  up appointment with care management team member scheduled for:  02/08/24 at 1 pm The patient has been provided with contact information for the care management team and has been advised to call with any health related questions or concerns.         Patient verbalizes understanding of instructions and care plan provided today and agrees to view in MyChart. Active MyChart status and patient understanding of how to access instructions and care plan via MyChart confirmed with patient.     The patient has been provided with contact information for the care management team and has been advised to call with any health related questions or concerns.   Please call the care guide team at [phone] if you need to cancel or reschedule your appointment.   Please call the Suicide and Crisis Lifeline: 988 call the [place]  National Suicide Prevention Lifeline: (684)488-5931 or TTY: 934 307 8774 TTY 820-029-8167) to talk to a trained counselor call 1-800-273-TALK (toll free, 24 hour hotline) if you are experiencing a Mental Health or Behavioral Health Crisis or need someone to talk to.  Arvin Seip RN, BSN, CCM [employer], Population Health Case Manager Phone: [phone]

## 2024-02-09 NOTE — Transitions of Care (Post Inpatient/ED Visit) (Signed)
 Transition of Care week #5  Visit Note  02/09/2024  Name: [name] MRN: [id number]          DOB: 07-19-1955  Situation: Patient enrolled in [facility] 30-day program. Visit completed with patient by telephone.   Background:   Initial Transition Care Management Follow-up Telephone Call Discharge Date and Diagnosis: No data recorded   Past Medical History:  Diagnosis Date   Adrenal nodule 12/28/2021   a.) CT abd/pel 12/28/2021: LEFT adrenal nodule measuring 1.6 cm   Anemia    Angina    Anxiety    Aortic atherosclerosis    Arthritis    Atrial fibrillation and flutter (HCC) 2013   a.) CHA2DS2VASc = 5 (age, CHF, HTN, vascular disease history, T2DM);  b.) s/p DCCV (100 J) 10/27/2010; c.) s/p ablation 09/21/2011; d.) rate/rhythm maintained on oral amiodarone  + carvedilol ; chronically anticoagulated with warfarin   CAD (coronary artery disease) 07/15/2010   a.) LHC 07/15/2010: 30% pLAD, 40% D1, 25%/30% pLCx, 30% mLCx - med mgmt   CHF (congestive heart failure) - NYHA Class II/III 2013   a.) TTE 02/26/2013: EF 50%, mild MR/TR; b.) EST 06/13/2017: EF 50%, mild LCH, triv MR/TR; c.) TTTE 03/24/2020: EF >55%, mild LVH, mild MAC, mild BAE, triv PR, mild MR/TR, G1DD; d.) TTE 11/19/2021: EF 60-65%, mod reduced RVSF, mild LAE, mod MAC, Ao root 37 mm, G2DD   COPD (chronic obstructive pulmonary disease) (HCC)    Diabetes type 2, uncontrolled    declines DSME   Dilated cardiomyopathy (HCC) 2013   a.) EF 25% at time of Dx in 2013; b.) TTE 02/26/2013: EF 50%; c.) EST 06/13/2017: EF 50%;  d.) TTE 03/24/2020: EF >55%; e.) TTE 11/19/2021: EF 60-65%   Ex-smoker    GERD (gastroesophageal reflux disease)    Hepatic steatosis    Hyperlipidemia    Hypertension    Infrarenal abdominal aortic aneurysm (AAA) without rupture 08/11/2016   a.) CT abd/pel 08/11/2016: 3.3 cm; b.) CT abd/pel 12/22/2018: 3.3 cm; c.) CT abd/pel 08/31/2021: 3.7 cm; d.) CT abd/pel 12/28/2021: 3.9 cm   Long term current use of  amiodarone  2023   a.) previously on dranaderone (cost prohibitive); switched to amiodarone  2023   Long term current use of anticoagulant    a.) warfarin   Marijuana use    a.) UDS (+) of THC x 3 (02/06/2013, 06/29/2013, 09/03/2013); PCP advised one more and we will stop prescribing ativan  (08/2013)   Migraine    Narcolepsy    Obesity    OSA (obstructive sleep apnea)    a.) unable to tolerate nocturnal PAP therapy; uses supplemetal oxygen  at 2L/ during sleep   Seasonal allergies    Umbilical hernia     Assessment: Patient Reported Symptoms: Cognitive Cognitive Status: No symptoms reported, Alert and oriented to person, place, and time, Insightful and able to interpret abstract concepts, Normal speech and language skills      Neurological Neurological Review of Symptoms: No symptoms reported    HEENT HEENT Symptoms Reported: No symptoms reported      Cardiovascular Cardiovascular Symptoms Reported: No symptoms reported    Respiratory Respiratory Symptoms Reported: No symptoms reported Additional Respiratory Details: patient states he uses his oxygen  at hs.    Endocrine Endocrine Symptoms Reported: No symptoms reported    Gastrointestinal Gastrointestinal Symptoms Reported: No symptoms reported      Genitourinary Genitourinary Symptoms Reported: No symptoms reported    Integumentary Integumentary Symptoms Reported: No symptoms reported    Musculoskeletal Musculoskelatal Symptoms Reviewed:  No symptoms reported        Psychosocial Psychosocial Symptoms Reported: No symptoms reported         There were no vitals filed for this visit.  Medications Reviewed Today     Reviewed by Lenisha Lacap E, RN (Registered Nurse) on 02/09/24 at 1209  Med List Status: <None>   Medication Order Taking? Sig Documenting Provider Last Dose Status Informant  acetaminophen  (TYLENOL ) 500 MG tablet [id number] Yes Take 2 tablets (1,000 mg total) by mouth every 6 (six) hours as needed for  mild pain. Desiderio Schanz, MD  Active Self  albuterol  (VENTOLIN  HFA) 108 (90 Base) MCG/ACT inhaler [id number] Yes Inhale 2 puffs into the lungs every 6 (six) hours as needed for wheezing or shortness of breath. Rilla Baller, MD  Active Self  amiodarone  (PACERONE ) 200 MG tablet [id number] Yes Take 1 tablet (200 mg total) by mouth daily. Cindie Ole DASEN, MD  Active Self  Blood Glucose Monitoring Suppl (ACCU-CHEK GUIDE ME) w/Device KIT [id number] Yes 2 (two) times daily. [provider]  Active Self  Blood Glucose Monitoring Suppl DEVI [id number] Yes 1 each by Does not apply route in the morning, at noon, and at bedtime. May substitute to any manufacturer covered by patient's insurance. Rilla Baller, MD  Active Self  budesonide -glycopyrrolate -formoterol  (BREZTRI  AEROSPHERE) 160-9-4.8 MCG/ACT AERO inhaler [id number] Yes Inhale 2 puffs into the lungs 2 (two) times daily. Rilla Baller, MD  Active Self           Med Note CARALYN, [facility] R   Wed Dec 14, 2023  3:20 PM) [place] via PAP [place]  Cholecalciferol  (VITAMIN D ) 50 MCG (2000 UT) CAPS [id number] Yes Take 1 capsule (2,000 Units total) by mouth daily. Rilla Baller, MD  Active Self  dapagliflozin  propanediol (FARXIGA ) 10 MG TABS tablet [id number] Yes Take 1 tablet (10 mg total) by mouth daily before breakfast. Donette Ellouise LABOR, FNP  Active Self           Med Note CARALYN, [name] R   Wed Dec 14, 2023  3:20 PM) [place] via [employer] PAP   Docusate Calcium (STOOL SOFTENER PO) [id number] Yes Take 1 tablet by mouth daily. [provider]  Active Self  FLUoxetine  (PROZAC ) 40 MG capsule [id number]  TAKE 1 CAPSULE (40 MG TOTAL) BY MOUTH DAILY. Rilla Baller, MD  Active Self  fluticasone  (FLONASE ) 50 MCG/ACT nasal spray [id number] Yes Place 2 sprays into both nostrils daily.  Patient taking differently: Place 2 sprays into both nostrils as needed.   Rilla Baller, MD  Active Self           Med Note GROVER, BURNARD RAMAN   Fri Apr 09, 2022  1:23 PM)    furosemide  (LASIX ) 80 MG tablet [id number] Yes Take 1 tablet (80 mg total) by mouth daily. With second dose as needed for fluid retention, leg swelling Rilla Baller, MD  Active Self  glimepiride  (AMARYL ) 4 MG tablet [id number] Yes Take 0.5 tablets (2 mg total) by mouth daily with breakfast.  Patient taking differently: Take 2 mg by mouth daily with breakfast. Patient states he takes 1 tablet daily   Rilla Baller, MD  Active Self  Glucose Blood (BLOOD GLUCOSE TEST STRIPS) STRP [id number] Yes 1 each by In Vitro route in the morning, at noon, and at bedtime. May substitute to any manufacturer covered by patient's insurance. Rilla Baller, MD  Active Self  guaiFENesin  (MUCINEX ) 600 MG 12 hr tablet [id number] Yes Take 1 tablet (600 mg total) by mouth  2 (two) times daily. Barbarann Nest, MD  Active   ipratropium-albuterol  (DUONEB) 0.5-2.5 (3) MG/3ML LARRAINE [id number] Yes TAKE 3 MLS BY NEBULIZATION EVERY 6 (SIX) HOURS AS NEEDED (SEVERE SHORTNESS OF BREATH/WHEEZING). Rilla Baller, MD  Active Self  Lancet Device MISC [id number] Yes 1 each by Does not apply route in the morning, at noon, and at bedtime. May substitute to any manufacturer covered by patient's insurance. Rilla Baller, MD  Active Self  Lancets Misc. MISC [id number] Yes 1 each by Does not apply route in the morning, at noon, and at bedtime. May substitute to any manufacturer covered by patient's insurance. Rilla Baller, MD  Active Self  latanoprost  (XALATAN ) 0.005 % ophthalmic solution [id number] Yes Place 1 drop into both eyes at bedtime. Rilla Baller, MD  Active Self  metFORMIN  (GLUCOPHAGE ) 500 MG tablet [id number] Yes Take 1 tablet (500 mg total) by mouth daily with breakfast. Rilla Baller, MD  Active Self  methocarbamol  (ROBAXIN ) 500 MG tablet [id number] Yes Take 1 tablet (500 mg total) by mouth 3 (three) times daily as needed for muscle spasms. Rilla Baller, MD  Active Self  metolazone   (ZAROXOLYN ) 2.5 MG tablet [id number] Yes TAKE 1 TABLET (2.5 MG TOTAL) BY MOUTH ONCE A WEEK. AS NEEDED FOR LEG SWELLING Rilla Baller, MD  Active   montelukast  (SINGULAIR ) 10 MG tablet [id number] Yes Take 1 tablet (10 mg total) by mouth at bedtime. Rilla Baller, MD  Active Self  OXYGEN  [id number] Yes Inhale 2 L into the lungs at bedtime. [provider]  Active Self  pantoprazole  (PROTONIX ) 40 MG tablet [id number] Yes Take 1 tablet (40 mg total) by mouth 2 (two) times daily before a meal. Rilla Baller, MD  Active Self  potassium chloride  (MICRO-K ) 10 MEQ CR capsule [id number] Yes Take 1 capsule (10 mEq total) by mouth daily. Rilla Baller, MD  Active Self  Semaglutide ,0.25 or 0.5MG /DOS, (OZEMPIC , 0.25 OR 0.5 MG/DOSE,) 2 MG/1.5ML SOPN [id number]  Inject 0.5 mg into the skin once a week.  Patient not taking: Reported on 02/09/2024   Rilla Baller, MD  Active Self  Semaglutide ,0.25 or 0.5MG /DOS, (OZEMPIC , 0.25 OR 0.5 MG/DOSE,) 2 MG/3ML SOPN [id number] Yes Inject 0.25 mg into the skin once a week. Rilla Baller, MD  Active   simvastatin  (ZOCOR ) 20 MG tablet [id number] Yes Take 1 tablet (20 mg total) by mouth every evening. Rilla Baller, MD  Active Self  triamcinolone  cream (KENALOG ) 0.1 % [id number] Yes Apply 1 Application topically 2 (two) times daily. For no more than 10 days at a time Rilla Baller, MD  Active Self  vitamin B-12 (CYANOCOBALAMIN ) 1000 MCG tablet [id number] Yes Take 1 tablet (1,000 mcg total) by mouth every Monday, Wednesday, and Friday. Rilla Baller, MD  Active Self  Vitamin D , Ergocalciferol , (DRISDOL ) 1.25 MG (50000 UNIT) CAPS capsule [id number] Yes Take 1 capsule (50,000 Units total) by mouth every 7 (seven) days. Rilla Baller, MD  Active   warfarin (COUMADIN ) 2.5 MG tablet [id number] Yes TAKE ONE TABLET BY MOUTH DAILY EXCEPT TAKE 1/2 TABLET ON MONDAY OR AS DIRECTED BY ANTICOAGULATION CLINIC Rilla Baller, MD  Active   Med List Note Luna Dorothe LABOR, RN 01/05/18 1237): UDS 01/05/18            Goals Addressed             This Visit's Progress    COMPLETED: [facility] Transitions of Care (TOC) Care Plan       Patient has completed the 30 day TOC program.  Goals have  been met.   Problems:  Recent Hospitalization for treatment of pneumonia Knowledge Deficit Related to management of pneumonia, DME- patient states he doesn't know how to use his portable oxygen  tanks and No Hospital Follow Up Provider appointment patient states he will talk with his primary care office regarding a follow up visit. - patient reports his breathing is better and he is using his oxygen  at HS.  He states he is back to his normal activity and is even able to do yard work.     Goal:  Over the next 30 days, the patient will not experience hospital readmission  Interventions:  Transitions of Care: Medications reviewed and compliance and encouraged Advised to call provider for worsening symptoms or call 911 for severe symptoms Advised to monitor oxygen  level daily and wear oxygen  as prescribed/ recommended  Advised to follow up with provider in 3-4 weeks for a chest xray follow up Reviewed upcoming provider visits.  Advised ongoing oxygen  saturation monitoring daily Assessed for new/ ongoing symptoms.  Advised to call provider for any new/ ongoing symptoms.    Patient Self Care Activities:  Call pharmacy for medication refills 3-7 days in advance of running out of medications Call provider office for new concerns or questions  Notify RN Care Manager of TOC call rescheduling needs Participate in Transition of Care Program/Attend TOC scheduled calls Take medications as prescribed   Call 911 for severe symptoms: SOB, chest pain, stroke like symptoms.   Plan:  Telephone follow up appointment with care management team member scheduled for:  02/08/24 at 1 pm The patient has been provided with contact information for the care management team and has  been advised to call with any health related questions or concerns.         Recommendation:   Continue Current Plan of Care  Follow Up Plan:   Closing From:  Transitions of Care Program  Arvin Seip RN, BSN, CCM [place]  [facility] Population Health Case Manager Phone: [phone]

## 2024-02-09 NOTE — Telephone Encounter (Signed)
 Spoke with pt relaying Dr. Talmadge message. Pt verbalizes understanding.

## 2024-02-09 NOTE — Progress Notes (Signed)
 Indication: Afib Tablet size of warfarin was changed to 2.5 mg at last INR check to perform more precise dosing. Pt did pick up new prescription of 2.5 mg tablets.  Pt reports today he did not notice he was to take 1/2 tablet (1.25 mg) on Mondays and 1 tablet (2.5 mg) all other days, at last INR check.  Pt has been taking 1 tablet (2.5 mg) everyday.  INR is slightly subtherapeutic today so weekly dosing was updated to 1 tablet daily since that is what pt has been taking. Will increase dose tody and then go back to 1 tablet daily. Increase dose today to take 1 1/2 tablets (3.75 mg) and then continue 1 tablet (2.5 mg) daily and recheck in 2 week.  Pt brought in [employer] paperwork for Ozempic  and Farxiga  and needed help completing because he could not read the paper. Helped pt complete. He did not bring proof of income so he will take the paperwork home and make a copy of his W2 and then mail all paperwork. Advised if any further assistance is needed to feel free to contact coumadin  clinic. Pt was appreciative.

## 2024-02-09 NOTE — Patient Instructions (Addendum)
 Pre visit review using our clinic review tool, if applicable. No additional management support is needed unless otherwise documented below in the visit note.  Increase dose today to take 1 1/2 tablets (3.75 mg) and then continue 1 tablet (2.5 mg) daily and recheck in 2 week.

## 2024-02-11 ENCOUNTER — Other Ambulatory Visit: Payer: Self-pay | Admitting: Family Medicine

## 2024-02-13 NOTE — Therapy (Incomplete)
 OUTPATIENT PHYSICAL THERAPY CERVICAL EVALUATION   Patient Name: [name] MRN: [id number] DOB:[birth year], 68 y.o., male Today's Date: 02/13/2024  END OF SESSION:   Past Medical History:  Diagnosis Date   Adrenal nodule 12/28/2021   a.) CT abd/pel 12/28/2021: LEFT adrenal nodule measuring 1.6 cm   Anemia    Angina    Anxiety    Aortic atherosclerosis    Arthritis    Atrial fibrillation and flutter (HCC) 2013   a.) CHA2DS2VASc = 5 (age, CHF, HTN, vascular disease history, T2DM);  b.) s/p DCCV (100 J) 10/27/2010; c.) s/p ablation 09/21/2011; d.) rate/rhythm maintained on oral amiodarone  + carvedilol ; chronically anticoagulated with warfarin   CAD (coronary artery disease) 07/15/2010   a.) LHC 07/15/2010: 30% pLAD, 40% D1, 25%/30% pLCx, 30% mLCx - med mgmt   CHF (congestive heart failure) - NYHA Class II/III 2013   a.) TTE 02/26/2013: EF 50%, mild MR/TR; b.) EST 06/13/2017: EF 50%, mild LCH, triv MR/TR; c.) TTTE 03/24/2020: EF >55%, mild LVH, mild MAC, mild BAE, triv PR, mild MR/TR, G1DD; d.) TTE 11/19/2021: EF 60-65%, mod reduced RVSF, mild LAE, mod MAC, Ao root 37 mm, G2DD   COPD (chronic obstructive pulmonary disease) (HCC)    Diabetes type 2, uncontrolled    declines DSME   Dilated cardiomyopathy (HCC) 2013   a.) EF 25% at time of Dx in 2013; b.) TTE 02/26/2013: EF 50%; c.) EST 06/13/2017: EF 50%;  d.) TTE 03/24/2020: EF >55%; e.) TTE 11/19/2021: EF 60-65%   Ex-smoker    GERD (gastroesophageal reflux disease)    Hepatic steatosis    Hyperlipidemia    Hypertension    Infrarenal abdominal aortic aneurysm (AAA) without rupture 08/11/2016   a.) CT abd/pel 08/11/2016: 3.3 cm; b.) CT abd/pel 12/22/2018: 3.3 cm; c.) CT abd/pel 08/31/2021: 3.7 cm; d.) CT abd/pel 12/28/2021: 3.9 cm   Long term current use of amiodarone  2023   a.) previously on dranaderone (cost prohibitive); switched to amiodarone  2023   Long term current use of anticoagulant    a.) warfarin   Marijuana use     a.) UDS (+) of THC x 3 (02/06/2013, 06/29/2013, 09/03/2013); PCP advised one more and we will stop prescribing ativan  (08/2013)   Migraine    Narcolepsy    Obesity    OSA (obstructive sleep apnea)    a.) unable to tolerate nocturnal PAP therapy; uses supplemetal oxygen  at 2L/[place] during sleep   Seasonal allergies    Umbilical hernia    Past Surgical History:  Procedure Laterality Date   ATRIAL FLUTTER ABLATION N/A 09/21/2011   Procedure: ATRIAL FLUTTER ABLATION;  Surgeon: Lynwood Rakers, MD;  Location: [facility] CATH LAB;  Service: Cardiovascular;  Laterality: N/A;   CARDIAC ELECTROPHYSIOLOGY MAPPING AND ABLATION  2013   for atrial flutter   CARDIOVASCULAR STRESS TEST  03/2012   ETT WNL Cloretta)   CARDIOVERSION N/A 08/12/2021   Procedure: CARDIOVERSION;  Surgeon: Hester Wolm PARAS, MD;  Location: [facility] ORS;  Service: Cardiovascular;  Laterality: N/A;   COLONOSCOPY WITH PROPOFOL  N/A 05/22/2020   TA, diverticulosis, descending colon ulcer biopsy WNL, int hem Renne, Darren, MD)   COLONOSCOPY WITH PROPOFOL  N/A 04/28/2022   multiple TAs - 10 small, 2 1.2cm, 2 9mm, rpt 3 yrs (Vanga, Corinn Skiff, MD)   CYST EXCISION N/A 08/05/2022   excision of back cysts x 2 (pathology returned epidermal inclusion cysts);  Surgeon: Desiderio Schanz, MD   ESOPHAGOGASTRODUODENOSCOPY (EGD) WITH PROPOFOL  N/A 05/22/2020   WNL (Wohl)   ESOPHAGOGASTRODUODENOSCOPY (  EGD) WITH PROPOFOL  N/A 04/28/2022   12mm duodenal TA, reactive gastropathy, enteric mucosa in stomach biopsy of unclear significance (Vanga, Corinn Skiff, MD)   GIVENS CAPSULE STUDY  04/28/2022   Procedure: GIVENS CAPSULE STUDY;  Surgeon: Unk Corinn Skiff, MD;  Location: [facility] ENDOSCOPY;  Service: Endoscopy;;   US  ECHOCARDIOGRAPHY  2014   EF 50%, nl LV fxn, RV nl size/function, mild mitral insuff   Patient Active Problem List   Diagnosis Date Noted   Pulmonary arterial hypertension (HCC) 01/14/2024   Right upper lobe pneumonia 01/04/2024   Thoracic  ascending aortic aneurysm 01/04/2024   Acute right-sided low back pain without sciatica 12/07/2023   Chronic nasal congestion 11/16/2023   Glaucoma (increased eye pressure) 10/06/2023   Dizziness 11/15/2022   Constipation 08/30/2022   Background diabetic retinopathy associated with type 2 diabetes mellitus (HCC) 05/04/2022   Encounter for power mobility device assessment 04/03/2022   Long-term (current) use of anticoagulants, INR goal 2.0-3.0 03/15/2022   Symptomatic anemia 02/23/2022   MDD (major depressive disorder), recurrent episode, moderate (HCC) 02/20/2022   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 (HCC) 01/14/2022   Acquired hypercoagulable state 01/14/2022   Chronic hypoxic respiratory failure, on home oxygen  therapy (HCC) 12/11/2021   Hx of adenomatous colonic polyps 09/29/2021   Abdominal pain 07/21/2021   Paroxysmal atrial fibrillation (HCC) 07/15/2021   PLMD (periodic limb movement disorder) 06/05/2020   Ulceration of intestine    Pedal edema 02/25/2020   Neck pain on left side 02/25/2020   Mediastinal mass 01/05/2020   Vitamin D  deficiency 04/19/2019   Low serum vitamin B12 04/19/2019   Iron  deficiency anemia 01/02/2019   Epigastric abdominal pain 09/19/2018   COPD (chronic obstructive pulmonary disease) (HCC) 07/25/2018   Chronic neck pain (Tertiary Area of Pain) (R>L) 01/05/2018   Chronic upper extremity pain (Fourth Area of Pain) (R>L) 01/05/2018   Chronic upper back pain 01/05/2018   Chronic bilateral low back pain without sciatica 01/05/2018   Chronic pain syndrome 01/05/2018   Right-sided headache 10/17/2017   AAA (abdominal aortic aneurysm) without rupture 09/15/2016   Adrenal adenoma, left 08/13/2016   Nodule of right lung 08/13/2016   Fatty liver 08/01/2016   Right shoulder pain 07/26/2016   Restrictive lung disease 06/04/2016   HFrEF with improved EF (previously 25%, most recently 55% 12/2022)    Tinea pedis 06/30/2015   Advanced care  planning/counseling discussion 10/16/2014   Encounter for general adult medical examination with abnormal findings 10/16/2014   Type 2 diabetes mellitus with other specified complication (HCC) 10/25/2013   Medicare annual wellness visit, subsequent 09/03/2013   Hx of umbilical hernia repair 09/03/2013   History of excision of epidermal inclusion cyst 09/03/2013   Hyperlipidemia associated with type 2 diabetes mellitus (HCC) 06/29/2013   CAD with history of MI 06/29/2013   Cardiomyopathy, dilated (HCC) 06/29/2013   Abnormal toxicological findings 06/24/2013   Exertional dyspnea 06/14/2013   Hypertension    Anxiety attack    History of atrial flutter 07/07/2011   Severe obesity (BMI 35.0-39.9) with comorbidity (HCC) 07/07/2011   Ex-smoker 08/13/2009   Obstructive sleep apnea 08/13/2009   DDD (degenerative disc disease), cervical 08/13/2009   Allergic rhinitis 06/24/2009    PCP: Rilla Baller, MD   REFERRING PROVIDER: Thomasena Sherran BROCKS, PA*   REFERRING DIAG: M54.2 (ICD-10-CM) - Cervicalgia   THERAPY DIAG:   Rationale for Evaluation and Treatment: {HABREHAB:27488}  ONSET DATE: Summer 2025  SUBJECTIVE:  SUBJECTIVE STATEMENT: *** Hand dominance: {MISC; OT HAND [phone]}  PERTINENT HISTORY:  ***  PAIN:  Are you having pain? Yes: NPRS scale: *** Pain location: *** Pain description: *** Aggravating factors: *** Relieving factors: ***  PRECAUTIONS: {Therapy precautions:24002}  RED FLAGS: {PT Red Flags:29287}     WEIGHT BEARING RESTRICTIONS: {Yes ***/No:24003}  FALLS:  Has patient fallen in last 6 months? {fallsyesno:27318}  LIVING ENVIRONMENT: Lives with: {[facility] lives with:25569::lives with their family} Lives in: {Lives in:25570}   OCCUPATION:  ***  PLOF: {PLOF:24004}  PATIENT GOALS: ***  NEXT MD VISIT: ***  OBJECTIVE:  Note: Objective measures were completed at Evaluation unless otherwise noted.  DIAGNOSTIC FINDINGS:  12/28/21  CT CERVICAL SPINE FINDINGS   Alignment: Reversal of normal lordosis is noted most likely is positional in origin. Mild grade 1 anterolisthesis of C4-5 is noted secondary to posterior facet joint hypertrophy.   Skull base and vertebrae: No acute fracture. No primary bone lesion or focal pathologic process.   Soft tissues and spinal canal: No prevertebral fluid or swelling. No visible canal hematoma.   Disc levels: Severe degenerative disc disease is noted at C5-6, C6-7 and C7-T1.   Upper chest: Negative.   Other: None.   IMPRESSION: No acute intracranial abnormality seen.   Severe multilevel degenerative disc disease is noted in the cervical spine. No fracture is noted.  PATIENT SURVEYS:  PSFS: THE PATIENT SPECIFIC FUNCTIONAL SCALE  Place score of 0-10 (0 = unable to perform activity and 10 = able to perform activity at the same level as before injury or problem)  Activity Date: ***         2.     3.     4.      Total Score ***      Total Score = Sum of activity scores/number of activities  Minimally Detectable Change: 3 points (for single activity); 2 points (for average score)  [facility] (nd). The Patient Specific Functional Scale . Retrieved from [email or web address]   COGNITION: Overall cognitive status: Within functional limits for tasks assessed  SENSATION: {sensation:27233}  POSTURE: {posture:25561}  PALPATION: ***   CERVICAL ROM:   Active ROM A/PROM (deg) eval  Flexion ***  Extension   Right lateral flexion   Left lateral flexion   Right rotation   Left rotation    (Blank rows = not tested)  UPPER EXTREMITY ROM:  Active ROM Right eval Left eval  Shoulder flexion    Shoulder  extension    Shoulder abduction    Shoulder adduction    Shoulder extension    Shoulder internal rotation    Shoulder external rotation    Elbow flexion    Elbow extension    Wrist flexion    Wrist extension    Wrist ulnar deviation    Wrist radial deviation    Wrist pronation    Wrist supination     (Blank rows = not tested)  UPPER EXTREMITY MMT:  MMT Right eval Left eval  Shoulder flexion    Shoulder extension    Shoulder abduction    Shoulder adduction    Shoulder extension    Shoulder internal rotation    Shoulder external rotation    Middle trapezius    Lower trapezius    Elbow flexion    Elbow extension    Wrist flexion    Wrist extension    Wrist ulnar deviation    Wrist radial deviation    Wrist pronation  Wrist supination    Grip strength     (Blank rows = not tested)  CERVICAL SPECIAL TESTS:  Spurling's test: {pos/neg:25243} and Distraction test: {pos/neg:25243}  FUNCTIONAL TESTS:  --  TREATMENTDATE:                                                                                                                              02/14/24 Initial evaluation of cervical spine completed followed by instruction and trial set of HEP.   PATIENT EDUCATION:  Education details: HEP Person educated: Patient Education method: Solicitor, Actor cues, and Verbal cues Education comprehension: verbalized understanding and returned demonstration  HOME EXERCISE PROGRAM: ***  ASSESSMENT:  CLINICAL IMPRESSION: Patient is a 68 y.o. male who was seen today for physical therapy evaluation and treatment for cervical pain.  He presents with ****  Patient would benefit from skilled PT to address these deficits and return to pain free previous LOA.  OBJECTIVE IMPAIRMENTS: {opptimpairments:25111}.   ACTIVITY LIMITATIONS: {activitylimitations:27494}  PARTICIPATION LIMITATIONS: {participationrestrictions:25113}  PERSONAL FACTORS: {Personal  factors:25162} are also affecting patient's functional outcome.   REHAB POTENTIAL: {rehabpotential:25112}  CLINICAL DECISION MAKING: Stable/uncomplicated  EVALUATION COMPLEXITY: {Evaluation complexity:25115}   GOALS: Goals reviewed with patient? Yes  SHORT TERM GOALS: Target date: ***  Pt to be independent with HEP. Baseline:  Goal status: INITIAL  2.  Decrease pain by 1 level. Baseline:  Goal status: INITIAL  3.  *** Baseline:  Goal status: INITIAL  LONG TERM GOALS: Target date: ***  Pt to be independent with self progressive HEP. Baseline:  Goal status: INITIAL  2.  Decrease pain to max 2/10 with all activities. Baseline:  Goal status: INITIAL  3.  Increase AROM by 25% where limited. Baseline:  Goal status: INITIAL  4.  *** Baseline:  Goal status: INITIAL  5.  *** Baseline:  Goal status: INITIAL  6.  *** Baseline:  Goal status: INITIAL   PLAN:  PT FREQUENCY: {rehab frequency:25116}  PT DURATION: {rehab duration:25117}  PLANNED INTERVENTIONS: 97164- PT Re-evaluation, 97110-Therapeutic exercises, 97530- Therapeutic activity, 97112- Neuromuscular re-education, 97535- Self Care, [id number]- Manual therapy, Patient/Family education, Balance training, Joint mobilization, and Spinal mobilization  PLAN FOR NEXT SESSION: ***   Burnard CHRISTELLA Meth, PT 02/13/2024, 10:38 AM

## 2024-02-14 ENCOUNTER — Ambulatory Visit

## 2024-02-17 ENCOUNTER — Encounter: Payer: Self-pay | Admitting: Oncology

## 2024-02-17 ENCOUNTER — Ambulatory Visit: Payer: Self-pay | Admitting: Oncology

## 2024-02-17 ENCOUNTER — Inpatient Hospital Stay: Admitting: Oncology

## 2024-02-17 ENCOUNTER — Inpatient Hospital Stay: Attending: Oncology

## 2024-02-17 VITALS — BP 143/82 | HR 52 | Temp 98.6°F | Resp 17 | Wt 235.5 lb

## 2024-02-17 DIAGNOSIS — D508 Other iron deficiency anemias: Secondary | ICD-10-CM

## 2024-02-17 DIAGNOSIS — Z807 Family history of other malignant neoplasms of lymphoid, hematopoietic and related tissues: Secondary | ICD-10-CM | POA: Diagnosis not present

## 2024-02-17 DIAGNOSIS — Z87891 Personal history of nicotine dependence: Secondary | ICD-10-CM | POA: Diagnosis not present

## 2024-02-17 DIAGNOSIS — Z801 Family history of malignant neoplasm of trachea, bronchus and lung: Secondary | ICD-10-CM | POA: Diagnosis not present

## 2024-02-17 DIAGNOSIS — Z79899 Other long term (current) drug therapy: Secondary | ICD-10-CM | POA: Insufficient documentation

## 2024-02-17 DIAGNOSIS — D509 Iron deficiency anemia, unspecified: Secondary | ICD-10-CM

## 2024-02-17 LAB — IRON AND TIBC
Iron: 55 ug/dL (ref 45–182)
Saturation Ratios: 16 % — ABNORMAL LOW (ref 17.9–39.5)
TIBC: 336 ug/dL (ref 250–450)
UIBC: 281 ug/dL

## 2024-02-17 LAB — CBC (CANCER CENTER ONLY)
HCT: 43.2 % (ref 39.0–52.0)
Hemoglobin: 14.2 g/dL (ref 13.0–17.0)
MCH: 31.1 pg (ref 26.0–34.0)
MCHC: 32.9 g/dL (ref 30.0–36.0)
MCV: 94.7 fL (ref 80.0–100.0)
Platelet Count: 256 K/uL (ref 150–400)
RBC: 4.56 MIL/uL (ref 4.22–5.81)
RDW: 13.8 % (ref 11.5–15.5)
WBC Count: 9.9 K/uL (ref 4.0–10.5)
nRBC: 0 % (ref 0.0–0.2)

## 2024-02-17 LAB — FERRITIN: Ferritin: 25 ng/mL (ref 24–336)

## 2024-02-17 NOTE — Patient Instructions (Signed)
 VISIT SUMMARY:  You came in today for a routine follow-up to monitor your iron  levels. You have been receiving iron  infusions every two to three months for iron  deficiency. Your hemoglobin level is currently 14.2, and we are waiting for your current iron  levels to come back. You mentioned feeling increased fatigue over the last few days, which you think might be due to low iron  levels, but overall you feel good.  YOUR PLAN:  -IRON  DEFICIENCY ANEMIA: Iron  deficiency anemia is a condition where your body does not have enough iron  to produce adequate levels of hemoglobin, which is necessary for carrying oxygen  in your blood. Your hemoglobin level has improved to 14.2. We will continue to monitor your iron  levels every three months and administer iron  infusions if your iron  levels drop or if your ferritin level falls below 50. We will contact you if an iron  infusion is needed based on your current lab results.  INSTRUCTIONS:  We will contact you if your current lab results indicate that you need an iron  infusion.

## 2024-02-17 NOTE — Progress Notes (Signed)
 Hematology/Oncology Consult note [facility]  [phone] [phone]  Patient Care Team: Rilla Baller, MD as PCP - General (Family Medicine) Cindie Ole DASEN, MD as PCP - Electrophysiology (Cardiology) Kelsie Agent, MD (Inactive) as Attending Physician (Cardiology) Hester Wolm PARAS, MD as Referring Physician (Internal Medicine) Melanee Annah BROCKS, MD as Consulting Physician (Oncology) Melanee Annah BROCKS, MD as Consulting Physician (Oncology)   Name of the patient: [name]  [id number]  1955-10-04   Date of visit: 02/17/24  Diagnosis-iron  deficiency anemia  Chief complaint/ Reason for visit-routine follow-up of iron  deficiency anemia  Heme/Onc history: Patient is a 68 year old male with a past medical history significant for coronary artery disease, fatty liver, COPD among other medical problems who has been referred to us  for iron  deficiency anemia.  His most recent CBC from 07/15/2021 showed H&H of 10.3/35.2 with an MCV of 69 with a normal white count and platelet count.  Hemoglobin had drifted down from 14 a year ago to 12.74 months ago to 10.3 presently.  Iron  studies showed a low iron  saturation of 3.2% and elevated TIBC with a ferritin level of 6.4.  B12 levels were elevated at 1500.  TSH normal at 2.8.  Patient underwent EGD and colonoscopy by Dr. Jinny in 2022.  EGD showed no abnormality colonoscopy showed 4 mm ulcer in the descending colon which was biopsied and was negative for malignancy.  Capsule study at that time was inconclusive as the capsule did not pass through into his small bowel.  Patient had a repeat EGD and colonoscopy in January 2024 as well which was again unremarkable with no evidence of bleeding.     Interval history- Discussed the use of AI scribe software for clinical note transcription with the patient, who gave verbal consent to proceed.  History of Present Illness   [name] is a 68 year old male  with iron  deficiency who presents for routine follow-up and monitoring of iron  levels.  He receives iron  infusions approximately every two to three months for iron  deficiency. His hemoglobin level is currently 14.2. His last iron  levels were checked in September, and current iron  levels are pending.  He feels 'lazy' when his iron  levels are low, although he currently feels good overall. He has experienced some increased fatigue over the last few days, which he associates with low iron  levels. No blood loss in stool or urine.  His iron  levels are monitored every three months.       ECOG PS- 1 Pain scale- 0   Review of systems- Review of Systems  Constitutional:  Positive for malaise/fatigue. Negative for chills, fever and weight loss.  HENT:  Negative for congestion, ear discharge and nosebleeds.   Eyes:  Negative for blurred vision.  Respiratory:  Negative for cough, hemoptysis, sputum production, shortness of breath and wheezing.   Cardiovascular:  Negative for chest pain, palpitations, orthopnea and claudication.  Gastrointestinal:  Negative for abdominal pain, blood in stool, constipation, diarrhea, heartburn, melena, nausea and vomiting.  Genitourinary:  Negative for dysuria, flank pain, frequency, hematuria and urgency.  Musculoskeletal:  Negative for back pain, joint pain and myalgias.  Skin:  Negative for rash.  Neurological:  Negative for dizziness, tingling, focal weakness, seizures, weakness and headaches.  Endo/Heme/Allergies:  Does not bruise/bleed easily.  Psychiatric/Behavioral:  Negative for depression and suicidal ideas. The patient does not have insomnia.       Allergies  Allergen Reactions   Atorvastatin Other (See Comments)  Dizziness   Lisinopril Cough     Past Medical History:  Diagnosis Date   Adrenal nodule 12/28/2021   a.) CT abd/pel 12/28/2021: LEFT adrenal nodule measuring 1.6 cm   Anemia    Angina    Anxiety    Aortic atherosclerosis     Arthritis    Atrial fibrillation and flutter (HCC) 2013   a.) CHA2DS2VASc = 5 (age, CHF, HTN, vascular disease history, T2DM);  b.) s/p DCCV (100 J) 10/27/2010; c.) s/p ablation 09/21/2011; d.) rate/rhythm maintained on oral amiodarone  + carvedilol ; chronically anticoagulated with warfarin   CAD (coronary artery disease) 07/15/2010   a.) LHC 07/15/2010: 30% pLAD, 40% D1, 25%/30% pLCx, 30% mLCx - med mgmt   CHF (congestive heart failure) - NYHA Class II/III 2013   a.) TTE 02/26/2013: EF 50%, mild MR/TR; b.) EST 06/13/2017: EF 50%, mild LCH, triv MR/TR; c.) TTTE 03/24/2020: EF >55%, mild LVH, mild MAC, mild BAE, triv PR, mild MR/TR, G1DD; d.) TTE 11/19/2021: EF 60-65%, mod reduced RVSF, mild LAE, mod MAC, Ao root 37 mm, G2DD   COPD (chronic obstructive pulmonary disease) (HCC)    Diabetes type 2, uncontrolled    declines DSME   Dilated cardiomyopathy (HCC) 2013   a.) EF 25% at time of Dx in 2013; b.) TTE 02/26/2013: EF 50%; c.) EST 06/13/2017: EF 50%;  d.) TTE 03/24/2020: EF >55%; e.) TTE 11/19/2021: EF 60-65%   Ex-smoker    GERD (gastroesophageal reflux disease)    Hepatic steatosis    Hyperlipidemia    Hypertension    Infrarenal abdominal aortic aneurysm (AAA) without rupture 08/11/2016   a.) CT abd/pel 08/11/2016: 3.3 cm; b.) CT abd/pel 12/22/2018: 3.3 cm; c.) CT abd/pel 08/31/2021: 3.7 cm; d.) CT abd/pel 12/28/2021: 3.9 cm   Long term current use of amiodarone  2023   a.) previously on dranaderone (cost prohibitive); switched to amiodarone  2023   Long term current use of anticoagulant    a.) warfarin   Marijuana use    a.) UDS (+) of THC x 3 (02/06/2013, 06/29/2013, 09/03/2013); PCP advised one more and we will stop prescribing ativan  (08/2013)   Migraine    Narcolepsy    Obesity    OSA (obstructive sleep apnea)    a.) unable to tolerate nocturnal PAP therapy; uses supplemetal oxygen  at 2L/[place] during sleep   Seasonal allergies    Umbilical hernia      Past Surgical History:   Procedure Laterality Date   ATRIAL FLUTTER ABLATION N/A 09/21/2011   Procedure: ATRIAL FLUTTER ABLATION;  Surgeon: Lynwood Rakers, MD;  Location: [facility] CATH LAB;  Service: Cardiovascular;  Laterality: N/A;   CARDIAC ELECTROPHYSIOLOGY MAPPING AND ABLATION  2013   for atrial flutter   CARDIOVASCULAR STRESS TEST  03/2012   ETT WNL Cloretta)   CARDIOVERSION N/A 08/12/2021   Procedure: CARDIOVERSION;  Surgeon: Hester Wolm PARAS, MD;  Location: [facility] ORS;  Service: Cardiovascular;  Laterality: N/A;   COLONOSCOPY WITH PROPOFOL  N/A 05/22/2020   TA, diverticulosis, descending colon ulcer biopsy WNL, int hem Renne, Darren, MD)   COLONOSCOPY WITH PROPOFOL  N/A 04/28/2022   multiple TAs - 10 small, 2 1.2cm, 2 9mm, rpt 3 yrs (Vanga, Corinn Skiff, MD)   CYST EXCISION N/A 08/05/2022   excision of back cysts x 2 (pathology returned epidermal inclusion cysts);  Surgeon: Desiderio Schanz, MD   ESOPHAGOGASTRODUODENOSCOPY (EGD) WITH PROPOFOL  N/A 05/22/2020   WNL (Wohl)   ESOPHAGOGASTRODUODENOSCOPY (EGD) WITH PROPOFOL  N/A 04/28/2022   12mm duodenal TA, reactive gastropathy, enteric mucosa in  stomach biopsy of unclear significance (Vanga, Corinn Skiff, MD)   GIVENS CAPSULE STUDY  04/28/2022   Procedure: GIVENS CAPSULE STUDY;  Surgeon: Unk Corinn Skiff, MD;  Location: [facility] ENDOSCOPY;  Service: Endoscopy;;   US  ECHOCARDIOGRAPHY  2014   EF 50%, nl LV fxn, RV nl size/function, mild mitral insuff    Social History   Socioeconomic History   Marital status: Single    Spouse name: Not on file   Number of children: 0   Years of education: Not on file   Highest education level: 12th grade  Occupational History   Occupation: disable   Tobacco Use   Smoking status: Former    Current packs/day: 0.00    Average packs/day: 1 pack/day for 31.0 years (31.0 ttl pk-yrs)    Types: Cigarettes    Start date: 04/27/1987    Quit date: 04/26/2018    Years since quitting: 5.8    Passive exposure: Past   Smokeless tobacco: Never   Vaping Use   Vaping status: Never Used  Substance and Sexual Activity   Alcohol use: No    Alcohol/week: 0.0 standard drinks of alcohol   Drug use: Yes    Types: Marijuana   Sexual activity: Not Currently  Other Topics Concern   Not on file  Social History Narrative   Pt lives in [place]   Divorced   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   Disability due to neck arthritis, anxiety and sleep apnea   Edu: HS   Activity: no regular exercise, mows lawn (riding and push)   Diet: good water, fruits/vegetables daily   Social Drivers of Health   Financial Resource Strain: Low Risk  (09/14/2023)   Overall Financial Resource Strain (CARDIA)    Difficulty of Paying Living Expenses: Not hard at all  Food Insecurity: No Food Insecurity (01/06/2024)   Hunger Vital Sign    Worried About Running Out of Food in the Last Year: Never true    Ran Out of Food in the Last Year: Never true  Transportation Needs: No Transportation Needs (01/06/2024)   PRAPARE - Administrator, Civil Service (Medical): No    Lack of Transportation (Non-Medical): No  Physical Activity: Inactive (09/14/2023)   Exercise Vital Sign    Days of Exercise per Week: 0 days    Minutes of Exercise per Session: 0 min  Stress: Stress Concern Present (09/14/2023)   [employer] of Occupational Health - Occupational Stress Questionnaire    Feeling of Stress : To some extent  Social Connections: Socially Isolated (09/14/2023)   Social Connection and Isolation Panel    Frequency of Communication with Friends and Family: More than three times a week    Frequency of Social Gatherings with Friends and Family: Twice a week    Attends Religious Services: Never    Database administrator or Organizations: No    Attends Banker Meetings: Never    Marital Status: Divorced  Catering manager Violence: Not At Risk (01/06/2024)   Humiliation, Afraid, Rape, and Kick questionnaire    Fear of Current or Ex-Partner: No     Emotionally Abused: No    Physically Abused: No    Sexually Abused: No    Family History  Problem Relation Age of Onset   Cancer Mother 61       NHL   Heart failure Father 56   Cancer Brother 65       lung (smoker)   Hypertension Maternal Grandfather  CAD Maternal Grandfather 43       MI   Diabetes Other        grandparents     Current Outpatient Medications:    acetaminophen  (TYLENOL ) 500 MG tablet, Take 2 tablets (1,000 mg total) by mouth every 6 (six) hours as needed for mild pain., Disp: , Rfl:    albuterol  (VENTOLIN  HFA) 108 (90 Base) MCG/ACT inhaler, Inhale 2 puffs into the lungs every 6 (six) hours as needed for wheezing or shortness of breath., Disp: 8.5 each, Rfl: 6   amiodarone  (PACERONE ) 200 MG tablet, Take 1 tablet (200 mg total) by mouth daily., Disp: 90 tablet, Rfl: 3   Blood Glucose Monitoring Suppl (ACCU-CHEK GUIDE ME) w/Device KIT, 2 (two) times daily., Disp: , Rfl:    Blood Glucose Monitoring Suppl DEVI, 1 each by Does not apply route in the morning, at noon, and at bedtime. May substitute to any manufacturer covered by patient's insurance., Disp: 1 each, Rfl: 0   budesonide -glycopyrrolate -formoterol  (BREZTRI  AEROSPHERE) 160-9-4.8 MCG/ACT AERO inhaler, Inhale 2 puffs into the lungs 2 (two) times daily., Disp: 32.1 g, Rfl: 11   Cholecalciferol  (VITAMIN D ) 50 MCG (2000 UT) CAPS, Take 1 capsule (2,000 Units total) by mouth daily., Disp: 30 capsule, Rfl:    dapagliflozin  propanediol (FARXIGA ) 10 MG TABS tablet, Take 1 tablet (10 mg total) by mouth daily before breakfast., Disp: 90 tablet, Rfl: 3   Docusate Calcium (STOOL SOFTENER PO), Take 1 tablet by mouth daily., Disp: , Rfl:    FLUoxetine  (PROZAC ) 40 MG capsule, TAKE 1 CAPSULE (40 MG TOTAL) BY MOUTH DAILY., Disp: 90 capsule, Rfl: 2   fluticasone  (FLONASE ) 50 MCG/ACT nasal spray, Place 2 sprays into both nostrils daily., Disp: 48 mL, Rfl: 3   furosemide  (LASIX ) 80 MG tablet, TAKE 1 TABLET BY MOUTH DAILY. WITH  SECOND DOSE AS NEEDED FOR FLUID RETENTION, LEG SWELLING, Disp: 180 tablet, Rfl: 2   glimepiride  (AMARYL ) 4 MG tablet, Take 0.5 tablets (2 mg total) by mouth daily with breakfast., Disp: , Rfl:    Glucose Blood (BLOOD GLUCOSE TEST STRIPS) STRP, 1 each by In Vitro route in the morning, at noon, and at bedtime. May substitute to any manufacturer covered by patient's insurance., Disp: 300 each, Rfl: 0   guaiFENesin  (MUCINEX ) 600 MG 12 hr tablet, Take 1 tablet (600 mg total) by mouth 2 (two) times daily., Disp: 60 tablet, Rfl: 0   ipratropium-albuterol  (DUONEB) 0.5-2.5 (3) MG/3ML SOLN, TAKE 3 MLS BY NEBULIZATION EVERY 6 (SIX) HOURS AS NEEDED (SEVERE SHORTNESS OF BREATH/WHEEZING)., Disp: 360 mL, Rfl: 0   Lancet Device MISC, 1 each by Does not apply route in the morning, at noon, and at bedtime. May substitute to any manufacturer covered by patient's insurance., Disp: 300 each, Rfl: 0   Lancets Misc. MISC, 1 each by Does not apply route in the morning, at noon, and at bedtime. May substitute to any manufacturer covered by patient's insurance., Disp: 300 each, Rfl: 0   latanoprost  (XALATAN ) 0.005 % ophthalmic solution, Place 1 drop into both eyes at bedtime., Disp: , Rfl:    metFORMIN  (GLUCOPHAGE ) 500 MG tablet, Take 1 tablet (500 mg total) by mouth daily with breakfast., Disp: 90 tablet, Rfl: 3   methocarbamol  (ROBAXIN ) 500 MG tablet, Take 1 tablet (500 mg total) by mouth 3 (three) times daily as needed for muscle spasms., Disp: 30 tablet, Rfl: 3   metolazone  (ZAROXOLYN ) 2.5 MG tablet, TAKE 1 TABLET (2.5 MG TOTAL) BY MOUTH ONCE A WEEK. AS  NEEDED FOR LEG SWELLING, Disp: 16 tablet, Rfl: 1   montelukast  (SINGULAIR ) 10 MG tablet, Take 1 tablet (10 mg total) by mouth at bedtime., Disp: 90 tablet, Rfl: 4   OXYGEN , Inhale 2 L into the lungs at bedtime., Disp: , Rfl:    pantoprazole  (PROTONIX ) 40 MG tablet, Take 1 tablet (40 mg total) by mouth 2 (two) times daily before a meal., Disp: 180 tablet, Rfl: 4   potassium  chloride (MICRO-K ) 10 MEQ CR capsule, Take 1 capsule (10 mEq total) by mouth daily., Disp: 110 capsule, Rfl: 3   Semaglutide ,0.25 or 0.5MG /DOS, (OZEMPIC , 0.25 OR 0.5 MG/DOSE,) 2 MG/1.5ML SOPN, Inject 0.5 mg into the skin once a week., Disp: 1.5 mL, Rfl: 6   Semaglutide ,0.25 or 0.5MG /DOS, (OZEMPIC , 0.25 OR 0.5 MG/DOSE,) 2 MG/3ML SOPN, Inject 0.25 mg into the skin once a week., Disp: 3 mL, Rfl: 0   simvastatin  (ZOCOR ) 20 MG tablet, Take 1 tablet (20 mg total) by mouth every evening., Disp: 90 tablet, Rfl: 4   triamcinolone  cream (KENALOG ) 0.1 %, Apply 1 Application topically 2 (two) times daily. For no more than 10 days at a time, Disp: 45 g, Rfl: 0   vitamin B-12 (CYANOCOBALAMIN ) 1000 MCG tablet, Take 1 tablet (1,000 mcg total) by mouth every Monday, Wednesday, and Friday., Disp: , Rfl:    Vitamin D , Ergocalciferol , (DRISDOL ) 1.25 MG (50000 UNIT) CAPS capsule, Take 1 capsule (50,000 Units total) by mouth every 7 (seven) days., Disp: 12 capsule, Rfl: 1   warfarin (COUMADIN ) 2.5 MG tablet, TAKE ONE TABLET BY MOUTH DAILY EXCEPT TAKE 1/2 TABLET ON MONDAY OR AS DIRECTED BY ANTICOAGULATION CLINIC, Disp: 105 tablet, Rfl: 0  Physical exam:  Vitals:   02/17/24 1303  BP: (!) 150/90  Pulse: (!) 56  Resp: 17  Temp: 98.6 F (37 C)  SpO2: 96%  Weight: 235 lb 8 oz (106.8 kg)   Physical Exam Cardiovascular:     Rate and Rhythm: Normal rate and regular rhythm.     Heart sounds: Normal heart sounds.  Pulmonary:     Effort: Pulmonary effort is normal.     Breath sounds: Normal breath sounds.  Skin:    General: Skin is warm and dry.  Neurological:     Mental Status: He is alert and oriented to person, place, and time.      I have personally reviewed labs listed below:    Latest Ref Rng & Units 01/13/2024   11:44 AM  CMP  Glucose 70 - 99 mg/dL 820   BUN 6 - 23 mg/dL 13   Creatinine 9.59 - 1.50 mg/dL 9.07   Sodium 864 - 854 mEq/L 140   Potassium 3.5 - 5.1 mEq/L 4.9   Chloride 96 - 112 mEq/L 99    CO2 19 - 32 mEq/L 34   Calcium 8.4 - 10.5 mg/dL 9.1       Latest Ref Rng & Units 02/17/2024   12:39 PM  CBC  WBC 4.0 - 10.5 K/uL 9.9   Hemoglobin 13.0 - 17.0 g/dL 85.7   Hematocrit 60.9 - 52.0 % 43.2   Platelets 150 - 400 K/uL 256    I have personally reviewed Radiology images listed below: No images are attached to the encounter.  DG Chest 2 View Result Date: 02/05/2024 CLINICAL DATA:  Follow-up pneumonia EXAM: CHEST - 2 VIEW COMPARISON:  01/04/2024 FINDINGS: Cardiomegaly. Diffuse bilateral pulmonary vascular prominence and interstitial opacity. Interval resolution of previously seen heterogeneous airspace opacity of the right upper lobe.  Disc degenerative disease of the thoracic spine. IMPRESSION: 1. Cardiomegaly with diffuse bilateral pulmonary vascular prominence and interstitial opacity, consistent with edema. 2. Interval resolution of previously seen heterogeneous airspace opacity of the right upper lobe. Electronically Signed   By: Marolyn JONETTA Jaksch M.D.   On: 02/05/2024 17:00     Assessment and plan- Patient is a 68 y.o. male here for routine follow-up of iron  deficiency anemia  Assessment and Plan    Iron  deficiency anemia Iron  deficiency anemia managed with iron  infusions. Hemoglobin improved to 14.2. Awaiting current iron  levels. Ferritin monitored to stay above 50. - Monitor iron  levels every three months. - Administer iron  infusions if iron  levels drop or ferritin is below 50. - Contact him if iron  infusion is needed based on current lab results.         Visit Diagnosis 1. Iron  deficiency anemia, unspecified iron  deficiency anemia type      Dr. Annah Skene, MD, MPH [facility] [phone] 02/17/2024 1:11 PM

## 2024-02-20 LAB — KAPPA/LAMBDA LIGHT CHAINS
Kappa free light chain: 22.7 mg/L — ABNORMAL HIGH (ref 3.3–19.4)
Kappa, lambda light chain ratio: 1.41 (ref 0.26–1.65)
Lambda free light chains: 16.1 mg/L (ref 5.7–26.3)

## 2024-02-23 ENCOUNTER — Ambulatory Visit

## 2024-02-23 DIAGNOSIS — Z7901 Long term (current) use of anticoagulants: Secondary | ICD-10-CM | POA: Diagnosis not present

## 2024-02-23 LAB — POCT INR: INR: 1.7 — AB (ref 2.0–3.0)

## 2024-02-23 NOTE — Telephone Encounter (Signed)
 Pt unsure if anything is still needed for the two medications he will receive financial assistance for in 2026.

## 2024-02-23 NOTE — Progress Notes (Signed)
 Indication: Afib Tablet size of warfarin was changed to 2.5 mg at last INR check to perform more precise dosing. Pt did pick up new prescription of 2.5 mg tablets.  Increase dose today to take 1 1/2 tablets (3.75 mg) and then continue 1 tablet (2.5 mg) daily except take 1 1/2 (3.75 mg) tablets on Monday. Recheck in 2 week.  Pt reports he still has questions if anything is needed for his pt assistance for two of his medication. Advised a msg will be sent to pharmacist to f/u. Pt appreciative.  Sent msg to pharmacist.

## 2024-02-23 NOTE — Patient Instructions (Addendum)
 Pre visit review using our clinic review tool, if applicable. No additional management support is needed unless otherwise documented below in the visit note.  Increase dose today to take 1 1/2 tablets (3.75 mg) and then continue 1 tablet (2.5 mg) daily except take 1 1/2 (3.75 mg) tablets on Monday. Recheck in 2 week.

## 2024-02-27 ENCOUNTER — Other Ambulatory Visit: Payer: Self-pay | Admitting: Oncology

## 2024-02-27 ENCOUNTER — Inpatient Hospital Stay: Attending: Oncology

## 2024-02-27 VITALS — BP 124/84 | HR 58 | Temp 98.9°F | Resp 19

## 2024-02-27 DIAGNOSIS — D508 Other iron deficiency anemias: Secondary | ICD-10-CM

## 2024-02-27 DIAGNOSIS — D509 Iron deficiency anemia, unspecified: Secondary | ICD-10-CM | POA: Insufficient documentation

## 2024-02-27 MED ORDER — SODIUM CHLORIDE 0.9 % IV SOLN
INTRAVENOUS | Status: DC
  Filled 2024-02-27: qty 250

## 2024-02-27 MED ORDER — SODIUM CHLORIDE 0.9 % IV SOLN
510.0000 mg | INTRAVENOUS | Status: DC
  Administered 2024-02-27: 510 mg via INTRAVENOUS
  Filled 2024-02-27: qty 510

## 2024-03-01 ENCOUNTER — Ambulatory Visit

## 2024-03-01 DIAGNOSIS — Z7901 Long term (current) use of anticoagulants: Secondary | ICD-10-CM

## 2024-03-01 LAB — POCT INR: INR: 1.9 — AB (ref 2.0–3.0)

## 2024-03-01 NOTE — Patient Instructions (Addendum)
 Pre visit review using our clinic review tool, if applicable. No additional management support is needed unless otherwise documented below in the visit note.  Change weekly dose to take 1 tablet (2.5 mg) daily except take 1 1/2 (3.75 mg) tablets on Monday and Thursday. Recheck in 2 week.

## 2024-03-01 NOTE — Progress Notes (Signed)
 Indication: Afib Tablet size of warfarin was changed to 2.5 mg at last INR check to perform more precise dosing. Pt did pick up new prescription of 2.5 mg tablets.  Change weekly dose to take 1 tablet (2.5 mg) daily except take 1 1/2 (3.75 mg) tablets on Monday and Thursday. Recheck in 2 week.

## 2024-03-04 ENCOUNTER — Other Ambulatory Visit: Payer: Self-pay | Admitting: Family Medicine

## 2024-03-05 ENCOUNTER — Inpatient Hospital Stay

## 2024-03-05 VITALS — BP 124/75 | HR 55 | Temp 97.3°F

## 2024-03-05 DIAGNOSIS — D509 Iron deficiency anemia, unspecified: Secondary | ICD-10-CM | POA: Diagnosis not present

## 2024-03-05 DIAGNOSIS — D508 Other iron deficiency anemias: Secondary | ICD-10-CM

## 2024-03-05 MED ORDER — SODIUM CHLORIDE 0.9 % IV SOLN
510.0000 mg | INTRAVENOUS | Status: DC
  Administered 2024-03-05: 510 mg via INTRAVENOUS
  Filled 2024-03-05: qty 510

## 2024-03-05 MED ORDER — SODIUM CHLORIDE 0.9 % IV SOLN
INTRAVENOUS | Status: DC
  Filled 2024-03-05: qty 250

## 2024-03-05 NOTE — Telephone Encounter (Signed)
 ERx

## 2024-03-06 ENCOUNTER — Ambulatory Visit: Payer: Self-pay

## 2024-03-06 NOTE — Telephone Encounter (Signed)
 FYI Only or Action Required?: Action required by provider: request for appointment. Or atbx  Patient was last seen in primary care on 01/13/2024 by Rilla Baller, MD.  Called Nurse Triage reporting Pain.  Symptoms began several months ago.  Interventions attempted: Nothing.  Symptoms are: gradually worsening.  Triage Disposition: See Physician Within 24 Hours  Patient/caregiver understands and will follow disposition?: No, wishes to speak with PCP   Copied from CRM #[id number]. Topic: Clinical - Red Word Triage >> Mar 06, 2024  9:39 AM Terri MATSU wrote: Red Word that prompted transfer to Nurse Triage: Pain behind eyes Reason for Disposition  Eye pain present > 24 hours  Answer Assessment - Initial Assessment Questions Pt  states that he has pain behind his right eye. He states he believes it is sinus things. He states previously Dr. MATSU has given him atbx and that helped and he is requesting that again today. When asked about blurry vision he states he always has blurry vision, not new and he has been to the eye doctor several times and was diagnosed with glaucoma. He states the pain comes and goes but it's lasting longer now. RN attempted to schedule with another provider in the office and pt didn't even let Rn finish sentence. He stated eh eh eh eh, I will only see Dr. MATSU, if he can't see me, see if he will just sent in atbx because that worked last time. RN advised would send a message.     1. ONSET: When did the pain start? (e.g., minutes, hours, days)     2 months ago 2. TIMING: Does the pain come and go, or has it been constant since it started? (e.g., constant, intermittent, fleeting)     Usually comes and goes now lasting longer 3. SEVERITY: How bad is the pain?  (Scale 1-10; mild, moderate or severe)     10 4. LOCATION: Where does it hurt?  (e.g., eyelid, eye, cheekbone)     Behind eye 5. CAUSE: What do you think is causing the pain?     Pt thinks sinuses 6.  VISION: Do you have blurred vision or changes in your vision?      Yes, not new  8. FEVER: Do you have a fever? If Yes, ask: What is it, how was it measured, and when did it start?      denies 9. OTHER SYMPTOMS: Do you have any other symptoms? (e.g., headache, nasal discharge, facial rash)     denies  Protocols used: Eye Pain and Other Symptoms-A-AH

## 2024-03-07 ENCOUNTER — Other Ambulatory Visit: Payer: Self-pay | Admitting: Family Medicine

## 2024-03-07 MED ORDER — AMOXICILLIN-POT CLAVULANATE 875-125 MG PO TABS: 1.0000 | ORAL_TABLET | Freq: Two times a day (BID) | ORAL | 0 refills | Status: AC

## 2024-03-07 NOTE — Progress Notes (Signed)
 ERx augmentin 

## 2024-03-07 NOTE — Telephone Encounter (Signed)
 Spoke with patient.  R facial pain/pressure headache started yesterday afternoon with radiation of shooting pain up to eye - no vision changes. Pain present prior to recent iron  infusion. No fevers/chills. O2 currently 95%.  He continues 2 sprays of flonase  into each nose once daily.  He saw eye doctor with reassuring eval - h/o glaucoma well managed with eye drops.  Previously treated for sinusitis with resolution of pain.   He has not yet seen ENT. # provided to schedule appt:  [facility] [address] [place], [address] [place] [address] [phone]  ERx augmentin  7d course - rec keep Friday appt.

## 2024-03-08 ENCOUNTER — Ambulatory Visit

## 2024-03-09 ENCOUNTER — Ambulatory Visit: Admitting: Family Medicine

## 2024-03-09 ENCOUNTER — Encounter: Payer: Self-pay | Admitting: Family Medicine

## 2024-03-09 VITALS — BP 110/72 | HR 65 | Temp 98.9°F | Ht 69.0 in | Wt 228.4 lb

## 2024-03-09 DIAGNOSIS — E1169 Type 2 diabetes mellitus with other specified complication: Secondary | ICD-10-CM | POA: Diagnosis not present

## 2024-03-09 DIAGNOSIS — R519 Headache, unspecified: Secondary | ICD-10-CM | POA: Diagnosis not present

## 2024-03-09 DIAGNOSIS — Z7984 Long term (current) use of oral hypoglycemic drugs: Secondary | ICD-10-CM

## 2024-03-09 DIAGNOSIS — H401134 Primary open-angle glaucoma, bilateral, indeterminate stage: Secondary | ICD-10-CM | POA: Diagnosis not present

## 2024-03-09 NOTE — Telephone Encounter (Signed)
 Patient came in office today and had patient form for AZ&ME. I have put in pharmacy folder at front office.

## 2024-03-09 NOTE — Patient Instructions (Addendum)
 Hold flonase   Finish augmentin  course.  Schedule ENT follow up.  Keep follow up with eye doctor next month to monitor glaucoma/eye pressures  Ok to increase ozempic  to 0.5mg  weekly. Retur to 0.25mg  dose if any trouble tolerating higher dose.
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
## 2024-03-10 DIAGNOSIS — H401190 Primary open-angle glaucoma, unspecified eye, stage unspecified: Secondary | ICD-10-CM | POA: Insufficient documentation

## 2024-03-10 NOTE — Assessment & Plan Note (Addendum)
 Chronic R maxillary facial pressure with radiation into periorbital region, right ear and up frontal scalp.  Presumed sinus related pain although recent head CTs without significant sinus disease appreciated.  He also did have right upper gum mouth pain on palpation today - he is edentulous and uses upper dentures, which he notes may not fit well after recent weight loss. This could be contributing to pain - recommended f/u with dentist for further evaluation for this. Recommend ENT evaluation r/o sinus disease given chronicity - # provided to call and schedule appt.  For now , will complete augmentin  7d course and update us  if persistent or worsening pain.  No red flags, not consistent with temporal arteritis.  He has been regular with flonase  use - will stop in h/o glaucoma.

## 2024-03-10 NOTE — Assessment & Plan Note (Addendum)
 Notes great effect on ozempic  0.25mg  weekly with 12 lb weight loss.  Desires dose titration which is reasonable - will start 0.5mg  weekly.  Continue metformin  500mg  once daily, amaryl  2mg  daily, farxiga  10mg  daily.  Consider stopping amaryl  if hypoglycemia develops.

## 2024-03-10 NOTE — Assessment & Plan Note (Signed)
 Sees [facility] Dr Portia, on xalatan  eye drops Will stop INS (flonase ) use.

## 2024-03-13 ENCOUNTER — Ambulatory Visit: Attending: Family Medicine

## 2024-03-13 DIAGNOSIS — I7143 Infrarenal abdominal aortic aneurysm, without rupture: Secondary | ICD-10-CM | POA: Diagnosis not present

## 2024-03-15 ENCOUNTER — Telehealth: Payer: Self-pay

## 2024-03-15 ENCOUNTER — Ambulatory Visit

## 2024-03-15 NOTE — Telephone Encounter (Signed)
 Received from provider office faxed consent letter from pt on [employer], faxed to [employer] today.

## 2024-03-15 NOTE — Telephone Encounter (Signed)
 Pt called to cancel his coumadin  clinic apt today due to a sick pet. He reports he cannot leave the pet and wants to RS apt for next week. Next week is a holiday and there will not be a coumadin  clinic nurse in the office. Pt has a f/u PCP apt on 11/25. Advised could have a POCT or lab INR at that apt and this nurse can f/u with him for dosing instructions after the result is received.  Advised if the test is a POCT this nurse will call him on 11/25, but if it is a lab INR the result will not return until 11/26 due to his apt being at 3 pm, and this nurse will f/u with him on 11/26.  Pt was appreciative and verbalized understanding.   Sending msg to PCP for request of POCT INR or lab INR at apt on 11/25.   Made note to look for result on 11/25

## 2024-03-16 ENCOUNTER — Telehealth: Payer: Self-pay

## 2024-03-16 NOTE — Telephone Encounter (Signed)
 Copied from CRM #[id number]. Topic: Appointments - Scheduling Inquiry for Clinic >> Mar 15, 2024  9:10 AM Macario HERO wrote: Reason for CRM: Patient called to cancel appointment today with  [facility]. Called [facility] and was advised to send a CRM to clinical. -- Patient is also requesting a call back to reschedule.

## 2024-03-16 NOTE — Telephone Encounter (Signed)
 Will order at appt next week

## 2024-03-16 NOTE — Telephone Encounter (Signed)
This was addressed yesterday

## 2024-03-20 ENCOUNTER — Ambulatory Visit: Admitting: Family Medicine

## 2024-03-21 ENCOUNTER — Ambulatory Visit
Admission: RE | Admit: 2024-03-21 | Discharge: 2024-03-21 | Disposition: A | Discharge status: Home / Self Care | Source: Ambulatory Visit | Attending: Acute Care | Admitting: Acute Care

## 2024-03-21 DIAGNOSIS — Z87891 Personal history of nicotine dependence: Secondary | ICD-10-CM | POA: Insufficient documentation

## 2024-03-21 DIAGNOSIS — Z122 Encounter for screening for malignant neoplasm of respiratory organs: Secondary | ICD-10-CM | POA: Insufficient documentation

## 2024-03-21 NOTE — Telephone Encounter (Signed)
 Pt cancelled his appt this week in office  Will have to reschedule for next week when office is open

## 2024-03-26 NOTE — Telephone Encounter (Signed)
 LVM for pt to return call to RS coumadin  clinic apt.

## 2024-03-26 NOTE — Telephone Encounter (Signed)
 Pt LVM he can come to coumadin  clinic apt on 12/4 if apt is available around 12:00.  LVM pt will be placed on the schedule for 12:00 and to return call if he cannot make this apt.

## 2024-03-27 ENCOUNTER — Other Ambulatory Visit: Payer: Self-pay | Admitting: Family Medicine

## 2024-03-27 DIAGNOSIS — Z7901 Long term (current) use of anticoagulants: Secondary | ICD-10-CM

## 2024-03-27 NOTE — Telephone Encounter (Signed)
 Pt is no longer using 5 mg tablets. Dose was changed to 2.5 mg Refused refill.

## 2024-03-27 NOTE — Telephone Encounter (Signed)
 Forwarding to [employer]

## 2024-03-29 ENCOUNTER — Ambulatory Visit

## 2024-03-29 ENCOUNTER — Telehealth: Payer: Self-pay

## 2024-03-29 DIAGNOSIS — Z7901 Long term (current) use of anticoagulants: Secondary | ICD-10-CM

## 2024-03-29 LAB — POCT INR: INR: 1.2 — AB (ref 2.0–3.0)

## 2024-03-29 NOTE — Progress Notes (Signed)
 Indication: Afib Pt was to start taking 1 1/2 tablets twice weekly after last INR check. He reports today he has only been taking 1 1/2 tablets once a week and has been taking 1 tablet all other days. Missing 1/2 tablet should not have dropped INR so drastically. Pt denies any other changes.  Due to pt not taking correct weekly dosing after last INR check and unable to determine why pt is subtherapeutic today will only make a change to dosing to increase to what pt was supposed to take after last apt. Educated pt on s/s of a clot. Pt verbalized understanding.  Increase dose today to take 2 1/2 tablets and increase dose tomorrow to take 1 1/2 tablets and then change weekly dose to take 1 tablet (2.5 mg) daily except take 1 1/2 (3.75 mg) tablets on Monday and Thursday. Recheck in 1 week.

## 2024-03-29 NOTE — Telephone Encounter (Signed)
 LVM to review recent Lung CT results. Pt sees Alan Mam, [place] at [facility]. No recent Echo.   IMPRESSION: 1. Lung-RADS 2, benign appearance or behavior. Continue annual screening with low-dose chest CT without contrast in 12 months. 2. Pulmonary artery enlargement suggests pulmonary arterial hypertension. 3. Similar ascending aortic dilatation at 4.2 cm. This can be re-evaluated on lung cancer screening CT in 1 year. 4. Aortic Atherosclerosis (ICD10-I70.0) and Emphysema (ICD10-J43.9). Coronary artery atherosclerosis. 5. Aortic valvular calcifications. Consider echocardiography to evaluate for valvular dysfunction. 6. Ongoing stability of an anterior mediastinum peripheral calcified lesion, consistent with a benign etiology. 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

## 2024-03-29 NOTE — Patient Instructions (Addendum)
 Pre visit review using our clinic review tool, if applicable. No additional management support is needed unless otherwise documented below in the visit note.  Increase dose today to take 2 1/2 tablets and increase dose tomorrow to take 1 1/2 tablets and then change weekly dose to take 1 tablet (2.5 mg) daily except take 1 1/2 (3.75 mg) tablets on Monday and Thursday. Recheck in 1 week.

## 2024-03-30 ENCOUNTER — Other Ambulatory Visit: Payer: Self-pay

## 2024-03-30 DIAGNOSIS — Z87891 Personal history of nicotine dependence: Secondary | ICD-10-CM

## 2024-03-30 DIAGNOSIS — Z122 Encounter for screening for malignant neoplasm of respiratory organs: Secondary | ICD-10-CM

## 2024-03-30 NOTE — Telephone Encounter (Signed)
 Spoke with patient and reviewed recent Lung CT results. He will complete an annual Lung CT again next year. Order placed. He will discuss cardiac findings with [facility]. Advises he is unsure if he has ever had an echo. He is not on BP meds but is on statin therapy. Pt states he will call Cardiology and schedule a follow up appt to discuss findings. Result and plan to PCP and Cardiology.

## 2024-04-05 ENCOUNTER — Telehealth: Payer: Self-pay

## 2024-04-05 ENCOUNTER — Ambulatory Visit

## 2024-04-05 NOTE — Telephone Encounter (Signed)
 LVM

## 2024-04-05 NOTE — Telephone Encounter (Signed)
 Pt NS coumadin  clinic apt today.  LVM to return call to RS.

## 2024-04-05 NOTE — Telephone Encounter (Signed)
 Pt returned call. Reports his dog is very sick and has to take her to the vet. He requested to RS coumadin  clinic apt to next week. RS apt and advised if any changes to contact the coumadin  clinic. Pt verbalized understanding.

## 2024-04-06 ENCOUNTER — Ambulatory Visit: Admitting: Family Medicine

## 2024-04-12 ENCOUNTER — Ambulatory Visit

## 2024-04-12 DIAGNOSIS — Z7901 Long term (current) use of anticoagulants: Secondary | ICD-10-CM | POA: Diagnosis not present

## 2024-04-12 LAB — POCT INR: INR: 1.4 — AB (ref 2.0–3.0)

## 2024-04-12 NOTE — Patient Instructions (Addendum)
 Pre visit review using our clinic review tool, if applicable. No additional management support is needed unless otherwise documented below in the visit note.  Increase dose today to take 2 tablets and increase dose tomorrow to take 1 1/2 tablets and then continue 1 tablet (2.5 mg) daily except take 1 1/2 (3.75 mg) tablets on Monday and Thursday. Recheck in 2 weeks.

## 2024-04-12 NOTE — Progress Notes (Signed)
 Indication: Afib Pt reports he has had a really bad couple of weeks and has missed doses and then increased doses to try to make up for it. Due to pt not taking correct weekly dosing after last INR check will try the dose changes made after the last INR check.  Increase dose today to take 2 tablets and increase dose tomorrow to take 1 1/2 tablets and then continue 1 tablet (2.5 mg) daily except take 1 1/2 (3.75 mg) tablets on Monday and Thursday. Recheck in 2 weeks due to holiday.

## 2024-04-18 ENCOUNTER — Other Ambulatory Visit: Payer: Self-pay | Admitting: Family Medicine

## 2024-04-18 DIAGNOSIS — Z7901 Long term (current) use of anticoagulants: Secondary | ICD-10-CM

## 2024-04-18 NOTE — Telephone Encounter (Signed)
 Pt is compliant with warfarin management and PCP apts.  Sent in refill of warfarin to requested pharmacy.

## 2024-04-24 ENCOUNTER — Ambulatory Visit

## 2024-04-24 ENCOUNTER — Ambulatory Visit (INDEPENDENT_AMBULATORY_CARE_PROVIDER_SITE_OTHER)

## 2024-04-24 DIAGNOSIS — Z7901 Long term (current) use of anticoagulants: Secondary | ICD-10-CM | POA: Diagnosis not present

## 2024-04-24 LAB — POCT INR: INR: 2 (ref 2.0–3.0)

## 2024-04-24 NOTE — Progress Notes (Signed)
 Indication: Afib Continue 1 tablet (2.5 mg) daily except take 1 1/2 (3.75 mg) tablets on Monday and Thursday. Recheck in 2 weeks.

## 2024-04-24 NOTE — Patient Instructions (Addendum)
 Pre visit review using our clinic review tool, if applicable. No additional management support is needed unless otherwise documented below in the visit note.  Continue 1 tablet (2.5 mg) daily except take 1 1/2 (3.75 mg) tablets on Monday and Thursday. Recheck in 2 weeks.

## 2024-05-07 ENCOUNTER — Telehealth: Payer: Self-pay

## 2024-05-07 NOTE — Telephone Encounter (Signed)
 SABRA

## 2024-05-10 ENCOUNTER — Ambulatory Visit

## 2024-05-10 DIAGNOSIS — Z7901 Long term (current) use of anticoagulants: Secondary | ICD-10-CM

## 2024-05-10 LAB — POCT INR: INR: 2 (ref 2.0–3.0)

## 2024-05-10 NOTE — Patient Instructions (Addendum)
 Pre visit review using our clinic review tool, if applicable. No additional management support is needed unless otherwise documented below in the visit note.  Continue 1 tablet (2.5 mg) daily except take 1 1/2 (3.75 mg) tablets on Monday and Thursday. Recheck in 4 weeks.

## 2024-05-10 NOTE — Progress Notes (Signed)
 Indication: Afib Continue 1 tablet (2.5 mg) daily except take 1 1/2 (3.75 mg) tablets on Monday and Thursday. Recheck in 4 weeks.

## 2024-05-18 ENCOUNTER — Inpatient Hospital Stay: Attending: Oncology

## 2024-05-18 DIAGNOSIS — D509 Iron deficiency anemia, unspecified: Secondary | ICD-10-CM | POA: Diagnosis present

## 2024-05-18 LAB — CBC (CANCER CENTER ONLY)
HCT: 46.6 % (ref 39.0–52.0)
Hemoglobin: 15.8 g/dL (ref 13.0–17.0)
MCH: 32 pg (ref 26.0–34.0)
MCHC: 33.9 g/dL (ref 30.0–36.0)
MCV: 94.3 fL (ref 80.0–100.0)
Platelet Count: 197 K/uL (ref 150–400)
RBC: 4.94 MIL/uL (ref 4.22–5.81)
RDW: 13.3 % (ref 11.5–15.5)
WBC Count: 10 K/uL (ref 4.0–10.5)
nRBC: 0 % (ref 0.0–0.2)

## 2024-05-18 LAB — IRON AND TIBC
Iron: 84 ug/dL (ref 45–182)
Saturation Ratios: 29 % (ref 17.9–39.5)
TIBC: 286 ug/dL (ref 250–450)
UIBC: 202 ug/dL

## 2024-05-18 LAB — FERRITIN: Ferritin: 76 ng/mL (ref 24–336)

## 2024-05-29 ENCOUNTER — Telehealth: Payer: Self-pay | Admitting: Family Medicine

## 2024-05-29 ENCOUNTER — Telehealth: Payer: Self-pay | Admitting: Pharmacist

## 2024-05-29 DIAGNOSIS — E1169 Type 2 diabetes mellitus with other specified complication: Secondary | ICD-10-CM

## 2024-05-29 MED ORDER — DAPAGLIFLOZIN PROPANEDIOL 10 MG PO TABS: 10.0000 mg | ORAL_TABLET | Freq: Every day | ORAL | 3 refills | Status: AC

## 2024-05-29 NOTE — Telephone Encounter (Signed)
 Yes would be reasonable to switch to trulicity  if he is able to get this through PAP - thank you.

## 2024-05-29 NOTE — Telephone Encounter (Signed)
 Get ozempic  and he only has one shot left please advise if he is still in the program or if he needs to reapply

## 2024-05-31 MED ORDER — TRULICITY 1.5 MG/0.5ML ~~LOC~~ SOAJ: SUBCUTANEOUS | 3 refills | Status: AC

## 2024-05-31 MED ORDER — TRULICITY 0.75 MG/0.5ML ~~LOC~~ SOAJ: 0.7500 mg | SUBCUTANEOUS | 0 refills | Status: AC

## 2024-06-07 ENCOUNTER — Ambulatory Visit

## 2024-06-15 ENCOUNTER — Ambulatory Visit: Admitting: Family Medicine

## 2024-08-17 ENCOUNTER — Inpatient Hospital Stay: Admitting: Oncology

## 2024-08-17 ENCOUNTER — Inpatient Hospital Stay

## 2024-09-14 ENCOUNTER — Ambulatory Visit
